# Patient Record
Sex: Male | Born: 1965 | Race: White | Hispanic: No | Marital: Single | Smoking: Current every day smoker
Health system: Southern US, Community
[De-identification: ages and names within clinical notes are randomized; demographics above are authoritative.]

## PROBLEM LIST (undated history)

## (undated) DIAGNOSIS — F419 Anxiety disorder, unspecified: Secondary | ICD-10-CM

## (undated) DIAGNOSIS — Z87442 Personal history of urinary calculi: Secondary | ICD-10-CM

## (undated) DIAGNOSIS — J439 Emphysema, unspecified: Secondary | ICD-10-CM

## (undated) DIAGNOSIS — K219 Gastro-esophageal reflux disease without esophagitis: Secondary | ICD-10-CM

## (undated) DIAGNOSIS — E291 Testicular hypofunction: Secondary | ICD-10-CM

## (undated) DIAGNOSIS — J45909 Unspecified asthma, uncomplicated: Secondary | ICD-10-CM

## (undated) DIAGNOSIS — K439 Ventral hernia without obstruction or gangrene: Secondary | ICD-10-CM

## (undated) DIAGNOSIS — K429 Umbilical hernia without obstruction or gangrene: Secondary | ICD-10-CM

## (undated) DIAGNOSIS — N189 Chronic kidney disease, unspecified: Secondary | ICD-10-CM

## (undated) DIAGNOSIS — I739 Peripheral vascular disease, unspecified: Secondary | ICD-10-CM

## (undated) DIAGNOSIS — M810 Age-related osteoporosis without current pathological fracture: Secondary | ICD-10-CM

## (undated) DIAGNOSIS — E119 Type 2 diabetes mellitus without complications: Secondary | ICD-10-CM

## (undated) DIAGNOSIS — G4733 Obstructive sleep apnea (adult) (pediatric): Secondary | ICD-10-CM

## (undated) DIAGNOSIS — E1142 Type 2 diabetes mellitus with diabetic polyneuropathy: Secondary | ICD-10-CM

## (undated) DIAGNOSIS — Z79891 Long term (current) use of opiate analgesic: Secondary | ICD-10-CM

## (undated) DIAGNOSIS — C801 Malignant (primary) neoplasm, unspecified: Secondary | ICD-10-CM

## (undated) DIAGNOSIS — T7840XA Allergy, unspecified, initial encounter: Secondary | ICD-10-CM

## (undated) DIAGNOSIS — N200 Calculus of kidney: Secondary | ICD-10-CM

## (undated) DIAGNOSIS — N529 Male erectile dysfunction, unspecified: Secondary | ICD-10-CM

## (undated) DIAGNOSIS — G8929 Other chronic pain: Secondary | ICD-10-CM

## (undated) DIAGNOSIS — I10 Essential (primary) hypertension: Secondary | ICD-10-CM

## (undated) DIAGNOSIS — G4730 Sleep apnea, unspecified: Secondary | ICD-10-CM

## (undated) DIAGNOSIS — M545 Low back pain, unspecified: Secondary | ICD-10-CM

## (undated) DIAGNOSIS — D509 Iron deficiency anemia, unspecified: Secondary | ICD-10-CM

## (undated) DIAGNOSIS — K760 Fatty (change of) liver, not elsewhere classified: Secondary | ICD-10-CM

## (undated) DIAGNOSIS — K449 Diaphragmatic hernia without obstruction or gangrene: Secondary | ICD-10-CM

## (undated) DIAGNOSIS — E785 Hyperlipidemia, unspecified: Secondary | ICD-10-CM

## (undated) DIAGNOSIS — F32A Depression, unspecified: Secondary | ICD-10-CM

## (undated) DIAGNOSIS — J449 Chronic obstructive pulmonary disease, unspecified: Secondary | ICD-10-CM

## (undated) DIAGNOSIS — M51379 Other intervertebral disc degeneration, lumbosacral region without mention of lumbar back pain or lower extremity pain: Secondary | ICD-10-CM

## (undated) DIAGNOSIS — K5790 Diverticulosis of intestine, part unspecified, without perforation or abscess without bleeding: Secondary | ICD-10-CM

## (undated) DIAGNOSIS — I2510 Atherosclerotic heart disease of native coronary artery without angina pectoris: Secondary | ICD-10-CM

## (undated) DIAGNOSIS — G629 Polyneuropathy, unspecified: Secondary | ICD-10-CM

## (undated) HISTORY — PX: VASECTOMY: SHX75

## (undated) HISTORY — PX: ABDOMINAL HYSTERECTOMY: SHX81

## (undated) HISTORY — PX: CIRCUMCISION: SUR203

## (undated) HISTORY — PX: MUSCLE BIOPSY: SHX716

## (undated) HISTORY — PX: APPENDECTOMY: SHX54

## (undated) HISTORY — PX: KIDNEY STONE SURGERY: SHX686

## (undated) HISTORY — DX: Chronic kidney disease, unspecified: N18.9

## (undated) HISTORY — PX: OTHER SURGICAL HISTORY: SHX169

## (undated) HISTORY — DX: Allergy, unspecified, initial encounter: T78.40XA

## (undated) HISTORY — DX: Anxiety disorder, unspecified: F41.9

## (undated) HISTORY — DX: Age-related osteoporosis without current pathological fracture: M81.0

## (undated) HISTORY — DX: Emphysema, unspecified: J43.9

---

## 2003-05-10 ENCOUNTER — Other Ambulatory Visit: Payer: Self-pay

## 2005-05-30 ENCOUNTER — Emergency Department: Payer: Self-pay | Admitting: Emergency Medicine

## 2005-12-07 ENCOUNTER — Other Ambulatory Visit: Payer: Self-pay

## 2005-12-07 ENCOUNTER — Emergency Department: Payer: Self-pay | Admitting: Emergency Medicine

## 2005-12-09 ENCOUNTER — Emergency Department: Payer: Self-pay | Admitting: Emergency Medicine

## 2007-05-11 ENCOUNTER — Other Ambulatory Visit: Payer: Self-pay

## 2007-05-11 ENCOUNTER — Emergency Department: Payer: Self-pay | Admitting: Emergency Medicine

## 2007-05-11 ENCOUNTER — Ambulatory Visit: Payer: Self-pay | Admitting: Cardiovascular Disease

## 2007-11-01 ENCOUNTER — Other Ambulatory Visit: Payer: Self-pay

## 2007-11-01 ENCOUNTER — Emergency Department: Payer: Self-pay | Admitting: Emergency Medicine

## 2007-11-01 IMAGING — CR RIGHT MIDDLE FINGER 2+V
1 series · 3 of 3 positions shown · non-contrast
Comparison: none

REASON FOR EXAM: pain swelling
COMMENTS:

[Series 1: view not recorded · 0.17mm/px · 3 of 3 slices shown]
[im 1/3]
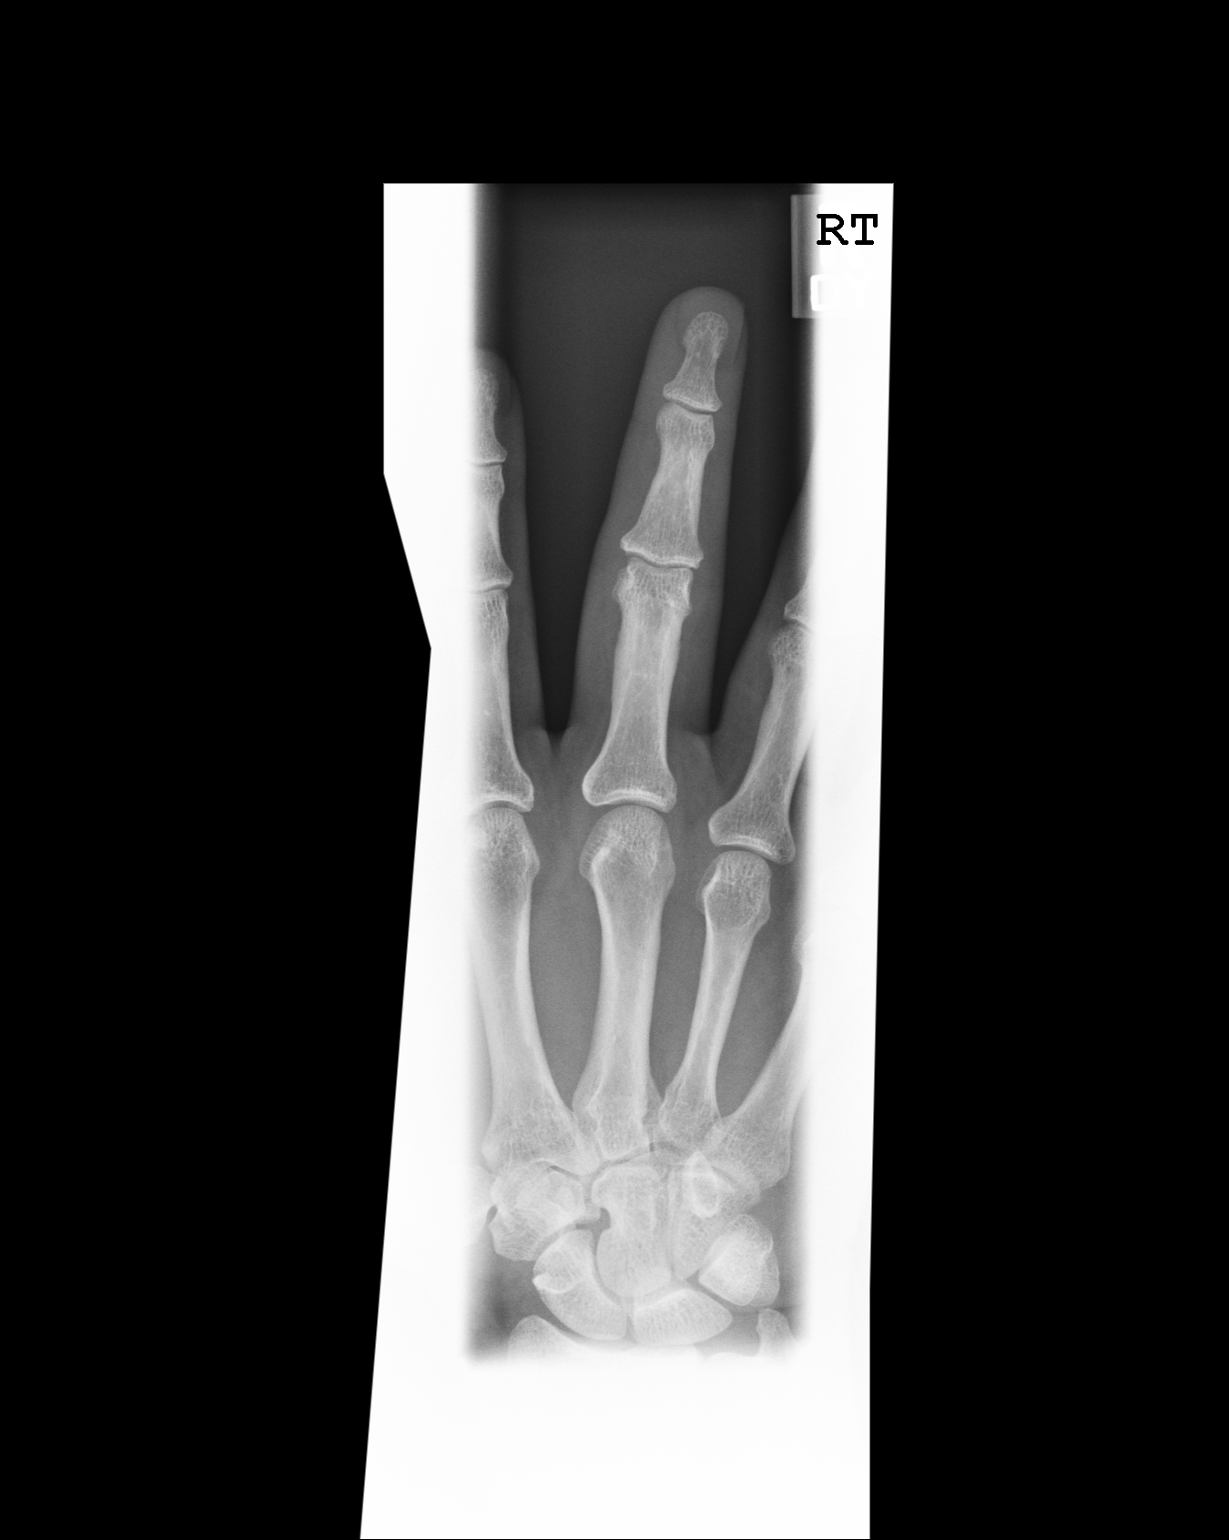
[im 2/3]
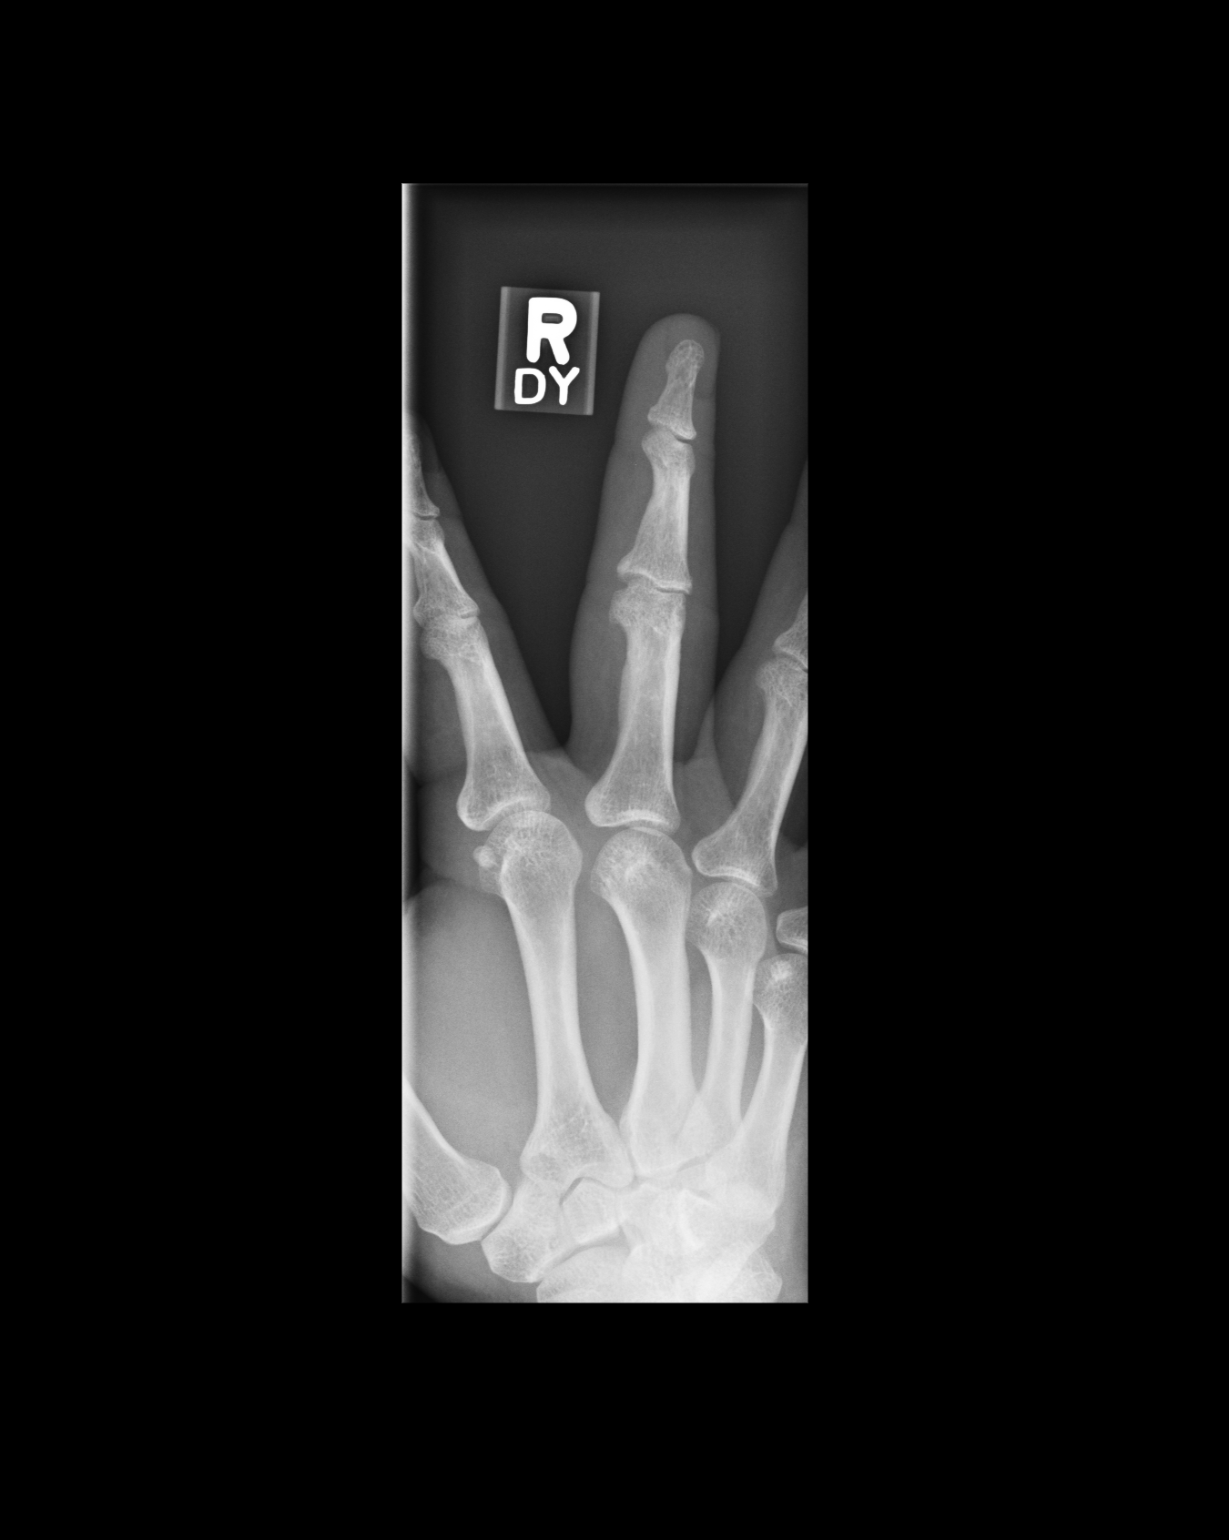
[im 3/3]
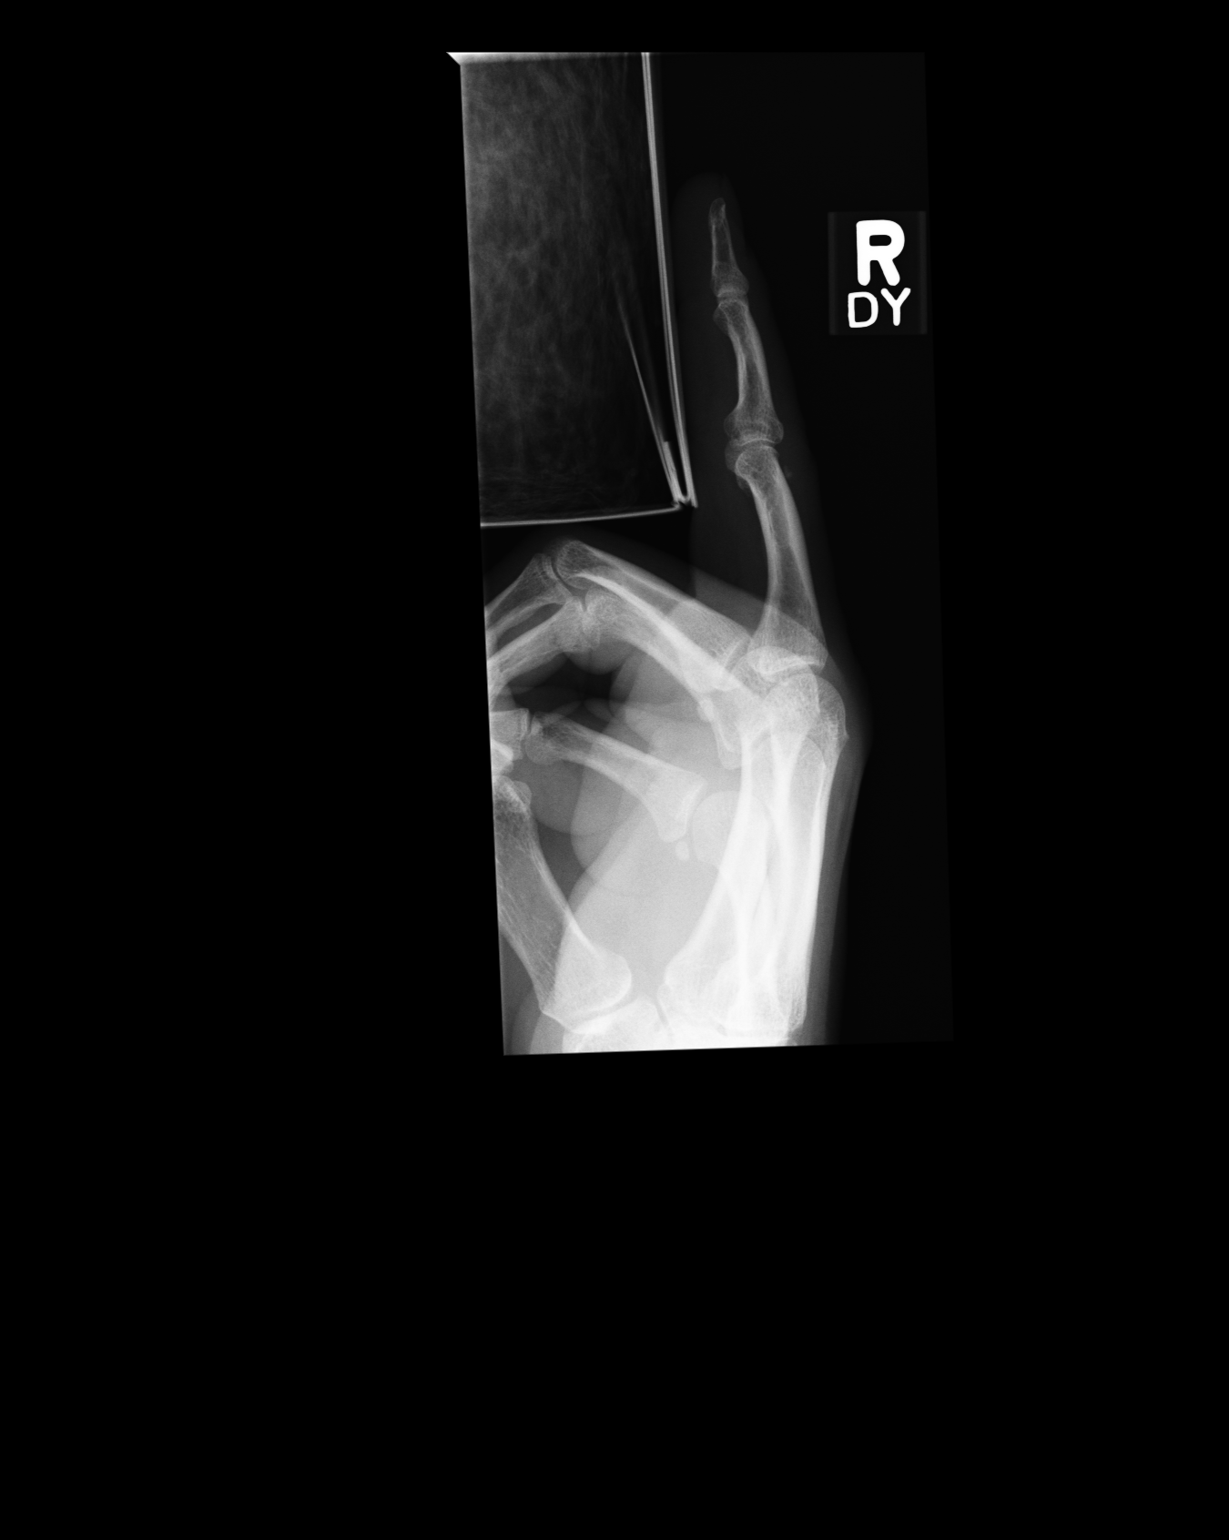

[3 of 3 positions shown; findings below may reference images not displayed]

PROCEDURE:     DXR - DXR FINGER MID 3RD DIGIT RT HAND  - [DATE]  [DATE]

RESULT:     No fracture, dislocation or other acute bony abnormality is
seen. There is slight hypertrophic spurring at the PIP joint. No erosive
arthritic changes are seen. No periosteal reaction is observed. No soft
tissue foreign body is seen.
IMPRESSION: Please see above.

## 2007-11-01 IMAGING — CR DG CHEST 2V
1 series · 2 of 2 positions shown · non-contrast
Comparison: none

REASON FOR EXAM: SOB
COMMENTS:

[Series 1: view not recorded · 0.17mm/px · 2 of 2 slices shown]
[im 1/2]
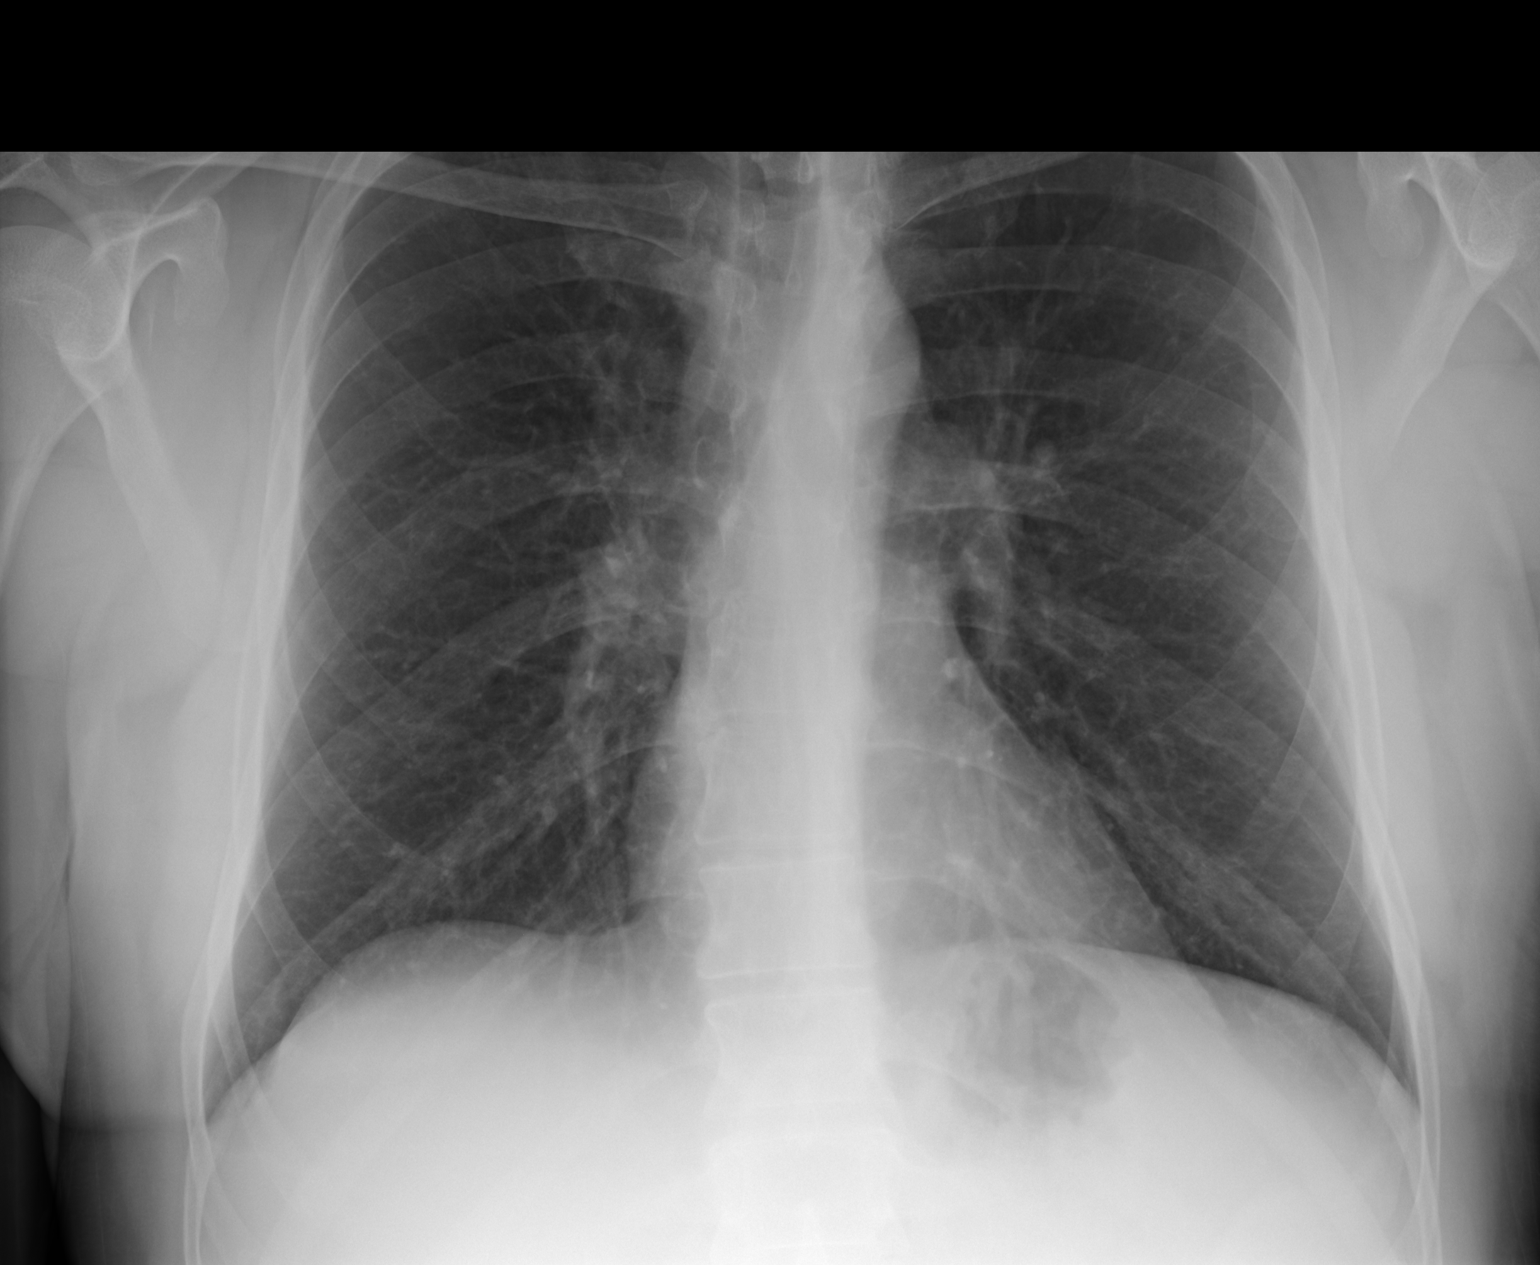
[im 2/2]
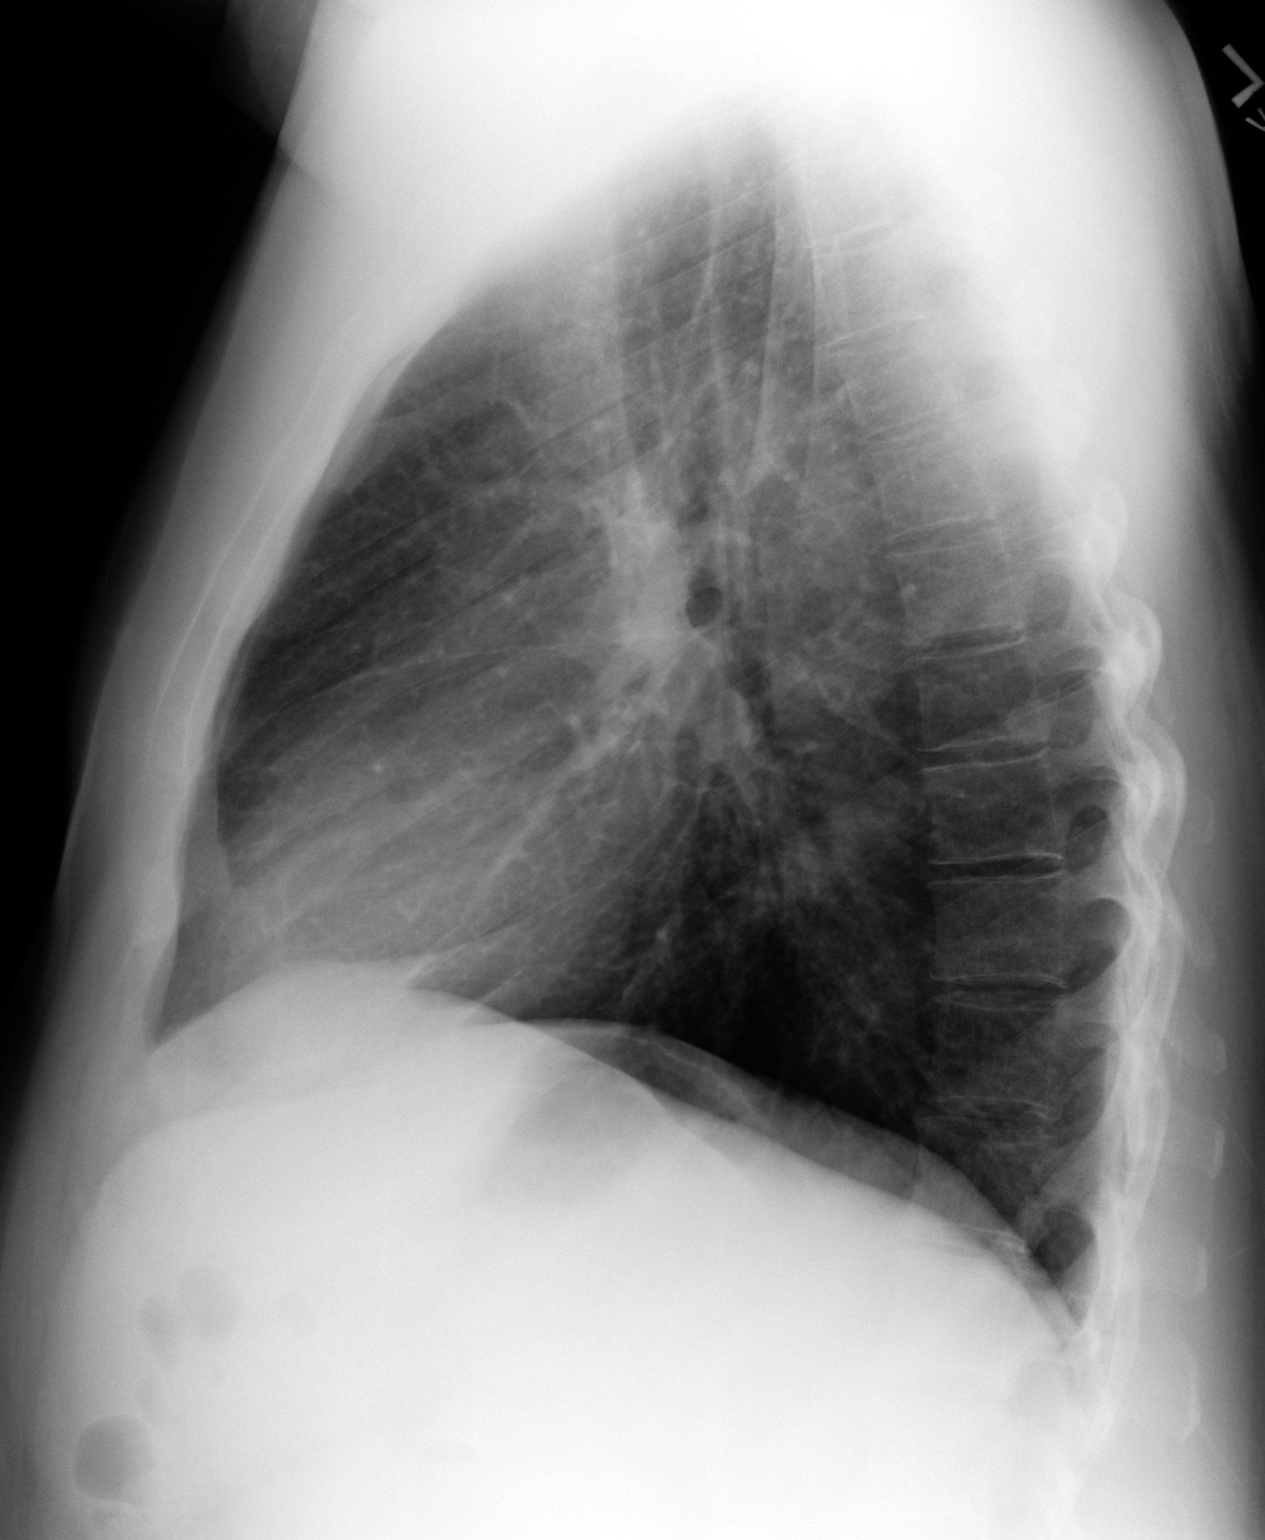

[2 of 2 positions shown; findings below may reference images not displayed]

PROCEDURE:     DXR - DXR CHEST PA (OR AP) AND LATERAL  - [DATE] [DATE]

RESULT:     There is no previous exam for comparison. The lungs are mildly
hyperinflated but clear. The heart and pulmonary vessels are normal.  The
bony and mediastinal structures are unremarkable. There is no infiltrate,
effusion or pneumothorax.
IMPRESSION: No acute cardiopulmonary disease evident.

## 2008-02-27 ENCOUNTER — Emergency Department: Payer: Self-pay | Admitting: Emergency Medicine

## 2008-02-27 IMAGING — CR DG CHEST 2V
1 series · 2 of 2 positions shown · non-contrast
Comparison: none

REASON FOR EXAM: cough
COMMENTS:

[Series 1: view not recorded · 0.17mm/px · 2 of 2 slices shown]
[im 1/2]
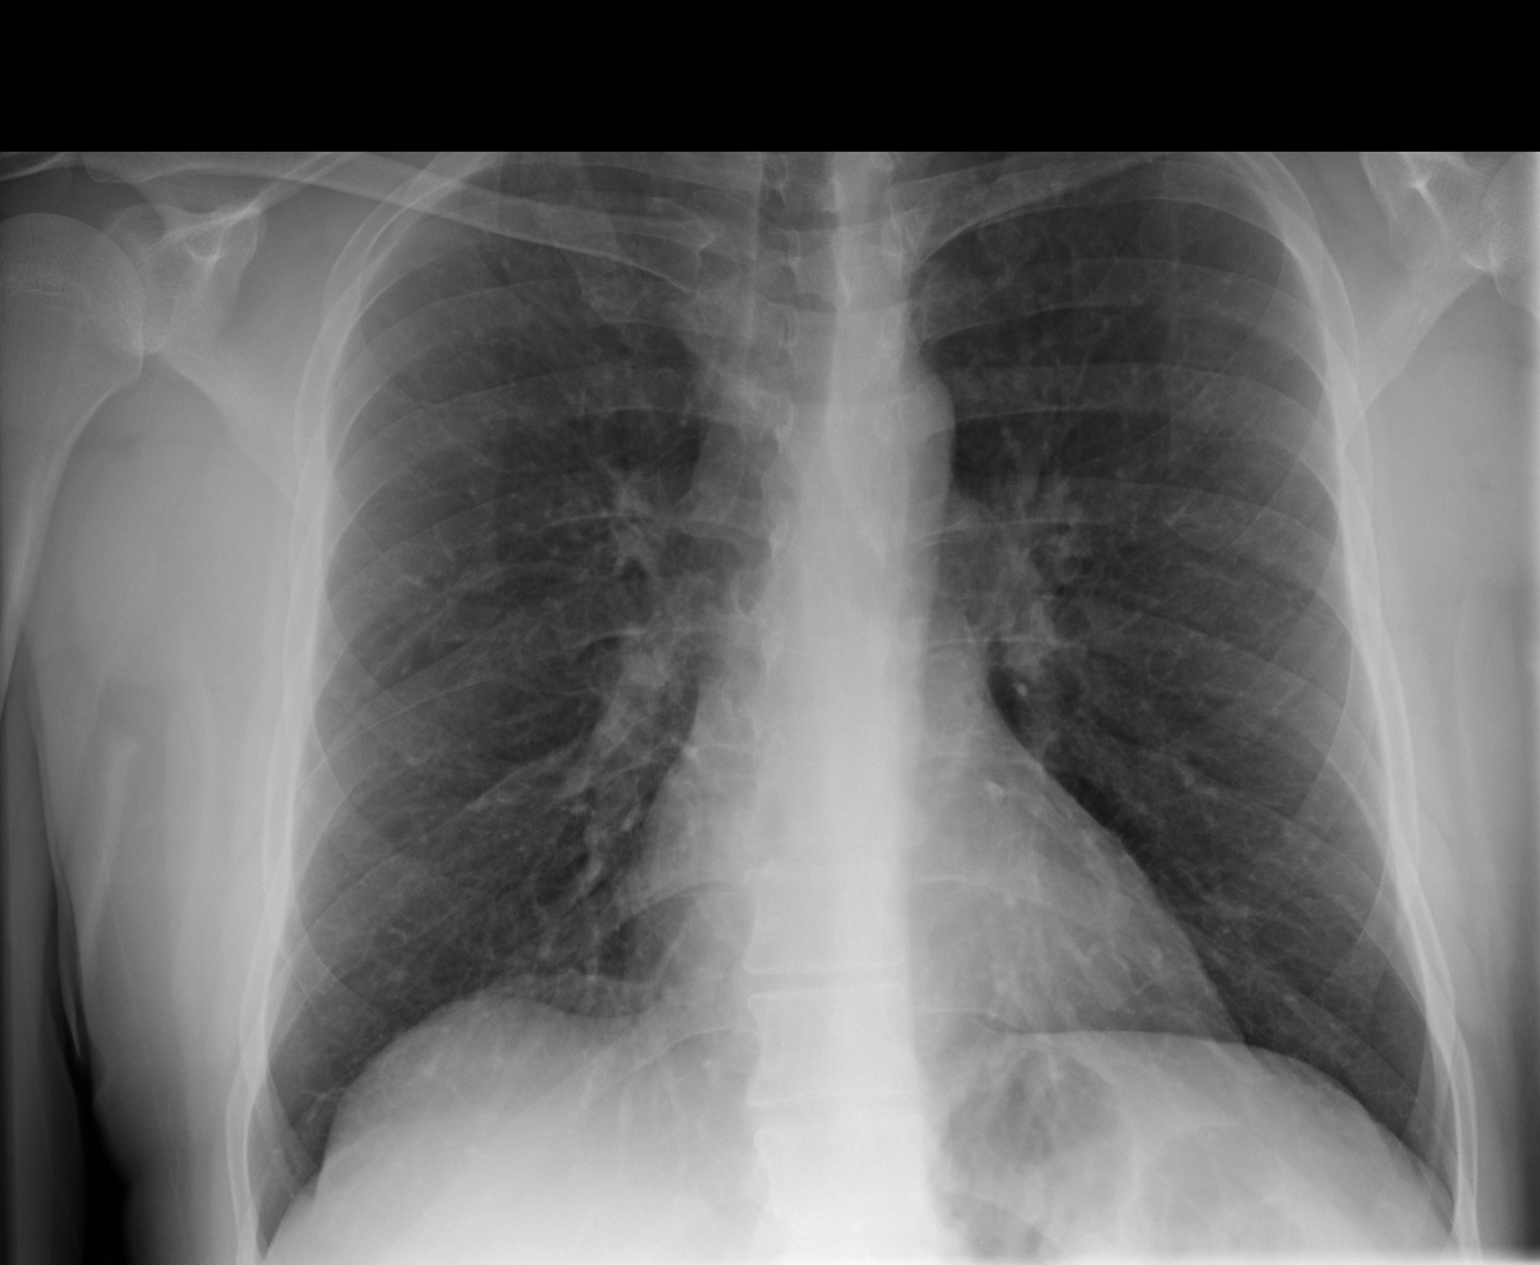
[im 2/2]
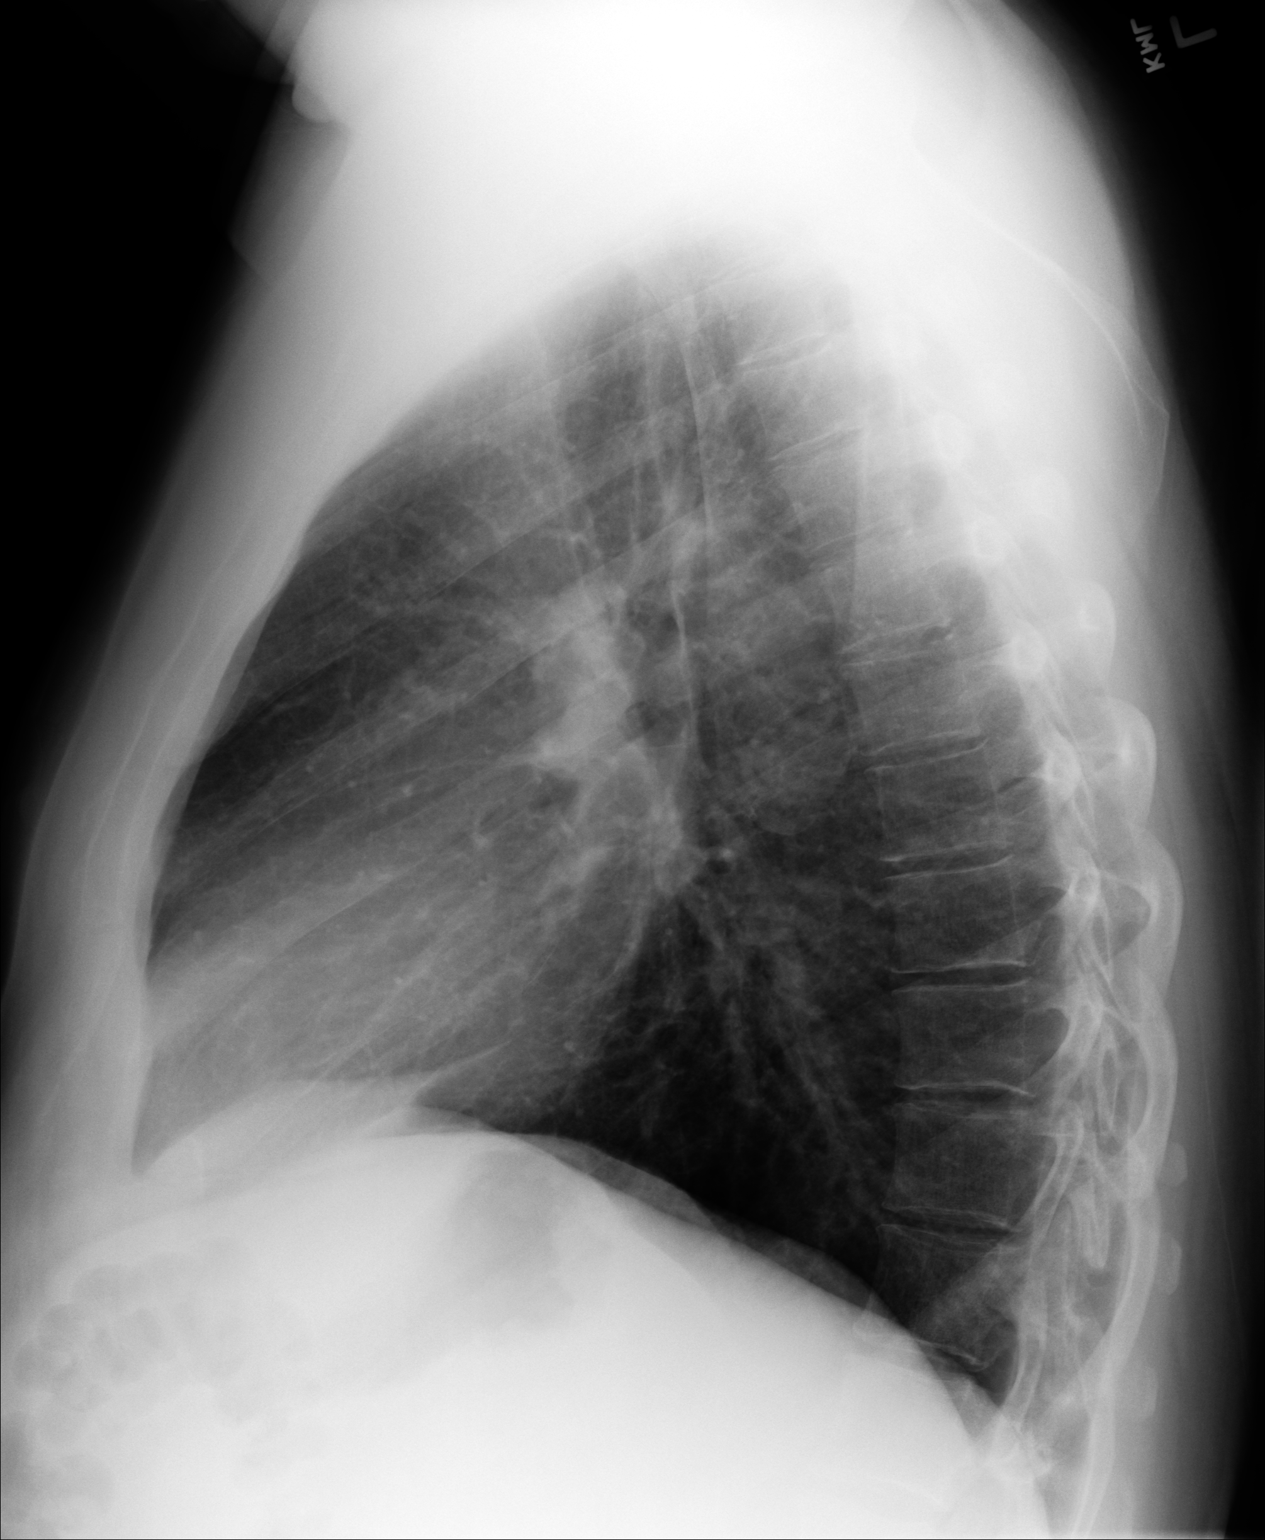

[2 of 2 positions shown; findings below may reference images not displayed]

PROCEDURE:     DXR - DXR CHEST PA (OR AP) AND LATERAL  - [DATE]  [DATE]

RESULT:     Comparison is made to a study [DATE]. The lungs are
mildly hyperinflated. There is no focal infiltrate. The heart is normal in
size. The interstitial markings of both lungs are mildly increased though
this is not entirely new.
IMPRESSION: I do not see evidence of pneumonia. I cannot exclude an
element of interstitial edema of likely noncardiac cause. Early interstitial
pneumonia could be present. Follow-up films are recommended following any
therapy or in 10 to 14 days.

## 2008-02-27 IMAGING — CT CT HEAD WITHOUT CONTRAST
2 series · 16 of 30 positions shown, 20 images · non-contrast
Comparison: none

REASON FOR EXAM: lightheaded, double vision
COMMENTS:

[Series 2: without · axial · non-contrast · 0.39mm/px · z∈[-152,-22]mm · 13 of 32 slices shown, 17 images]
[im 3/32  brain]
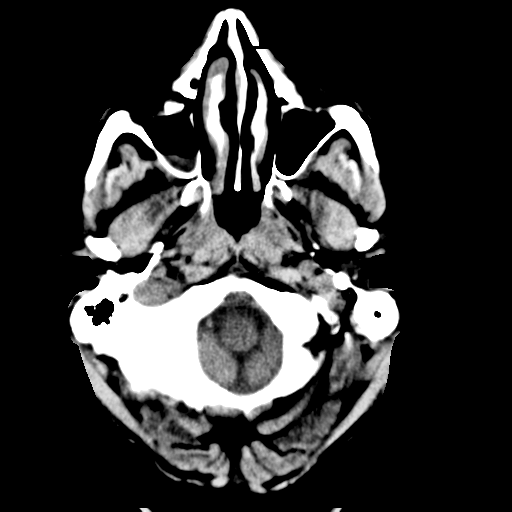
[im 3/32  bone]
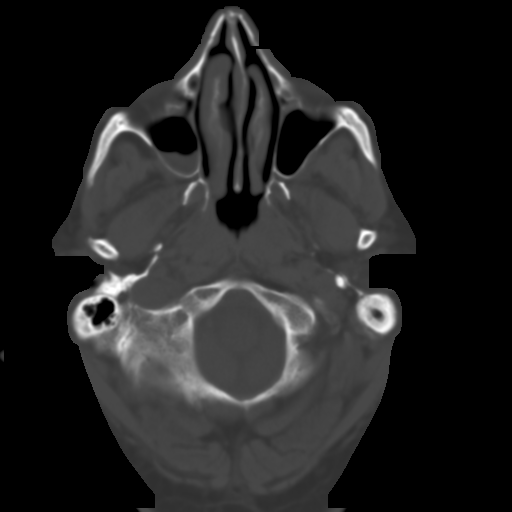
[im 5/32  brain]
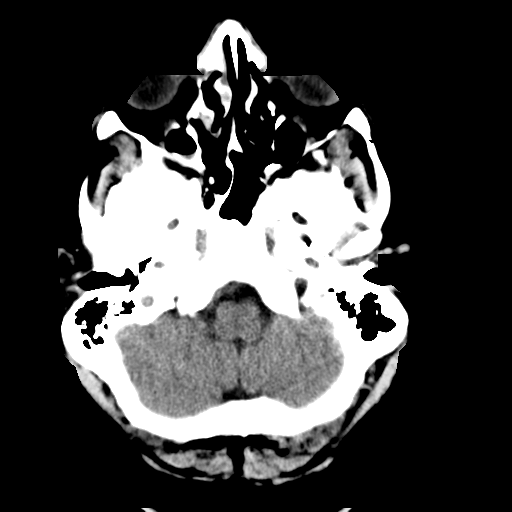
[im 7/32  brain]
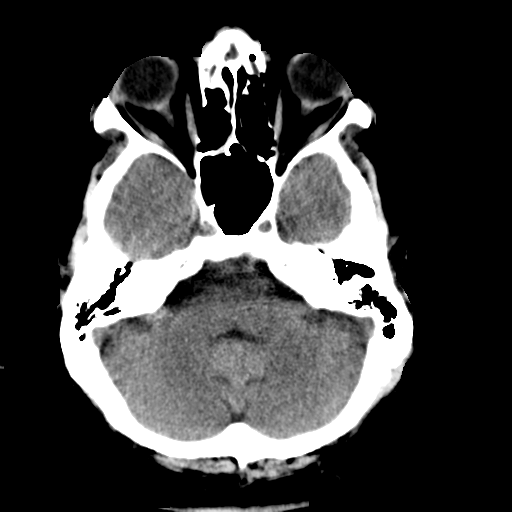
[im 9/32  brain]
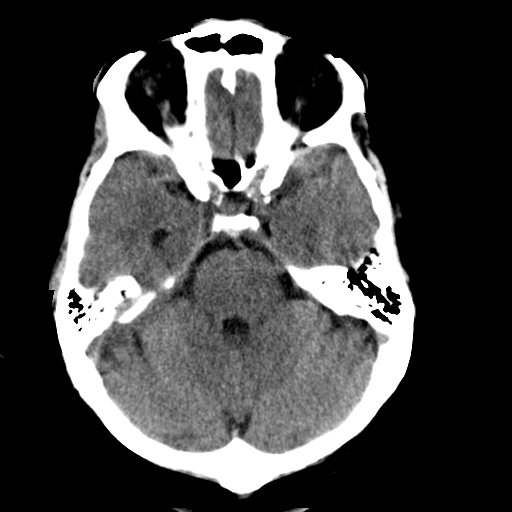
[im 12/32  brain]
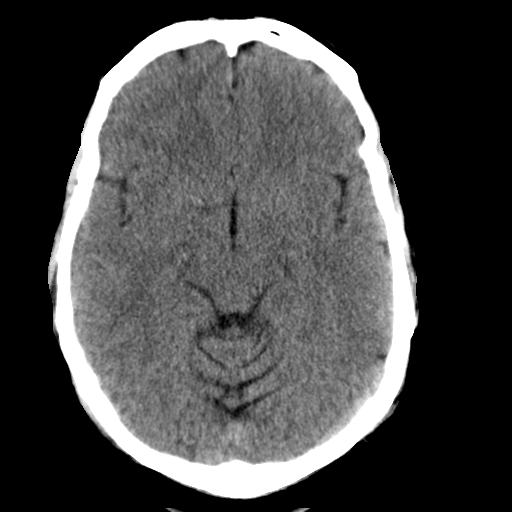
[im 12/32  bone]
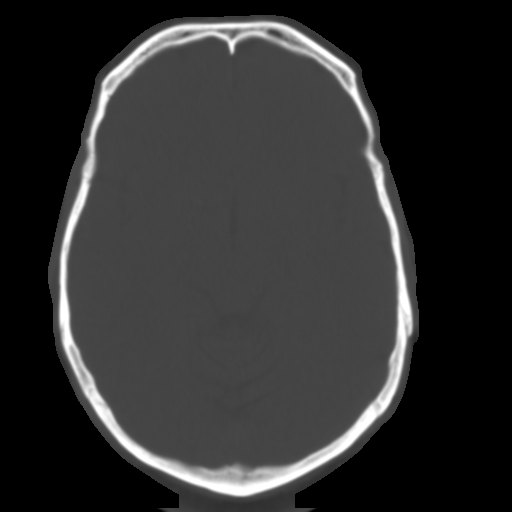
[im 14/32  brain]
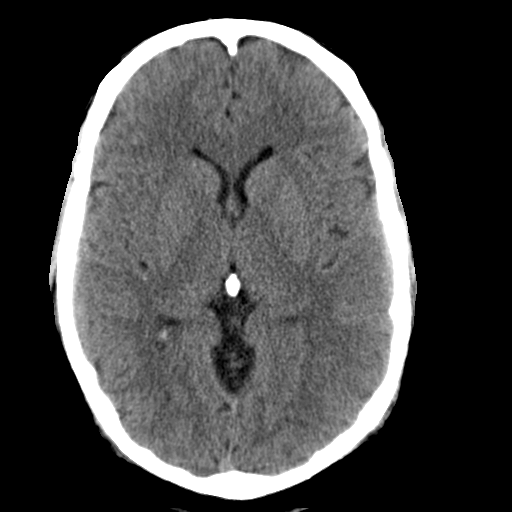
[im 16/32  brain]
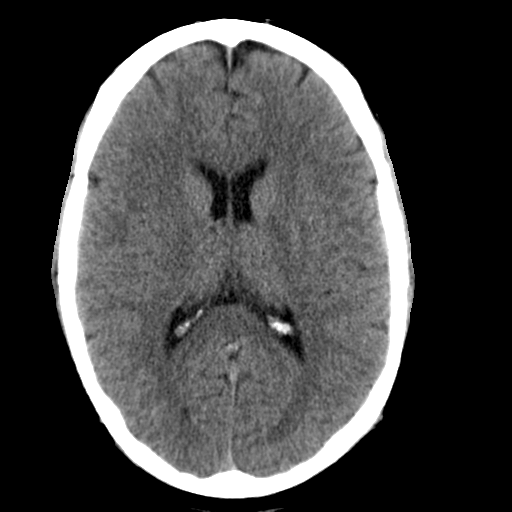
[im 18/32  brain]
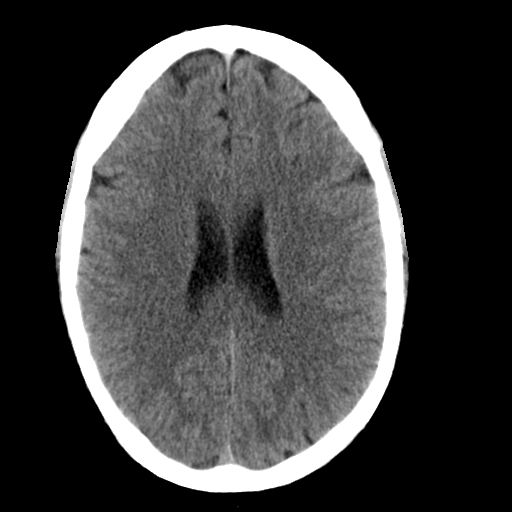
[im 20/32  brain]
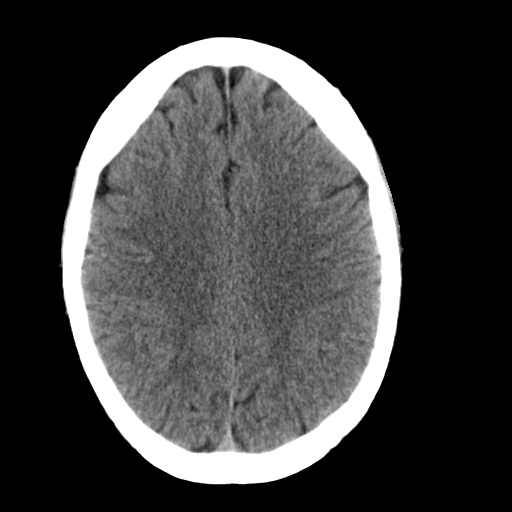
[im 20/32  bone]
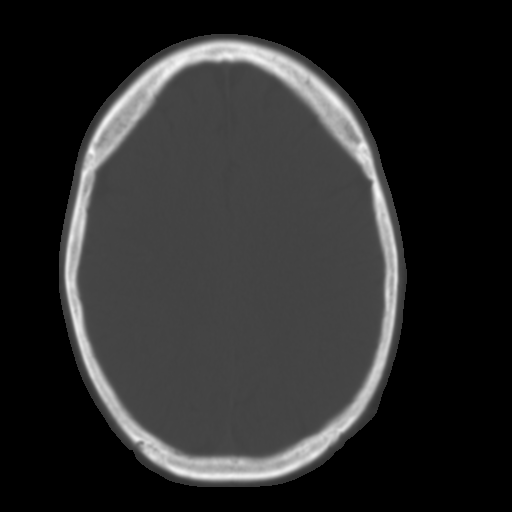
[im 23/32  brain]
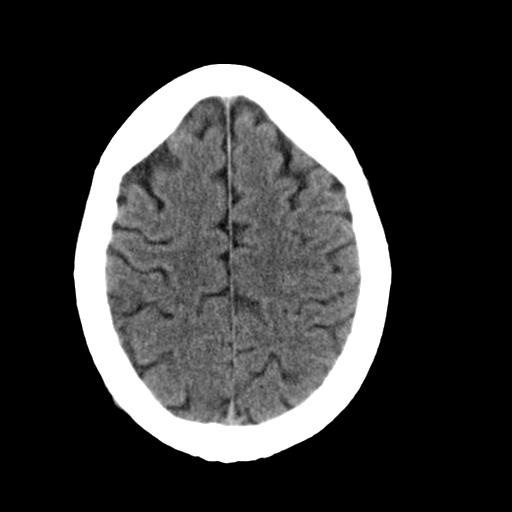
[im 25/32  brain]
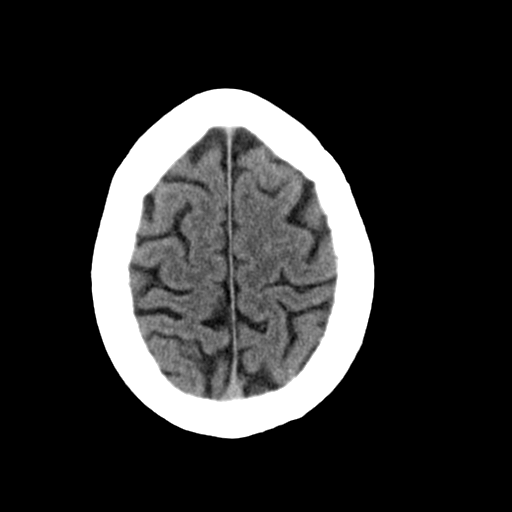
[im 27/32  brain]
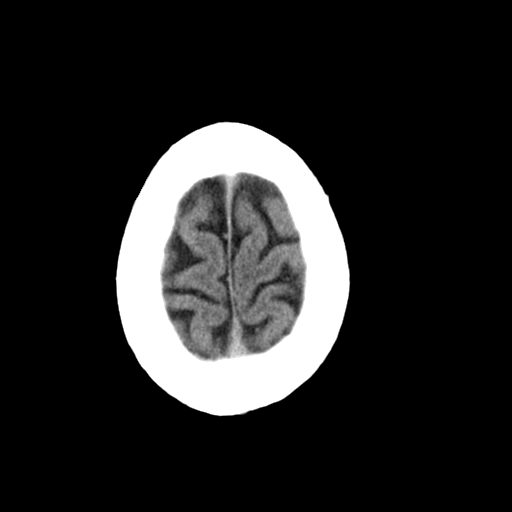
[im 29/32  brain]
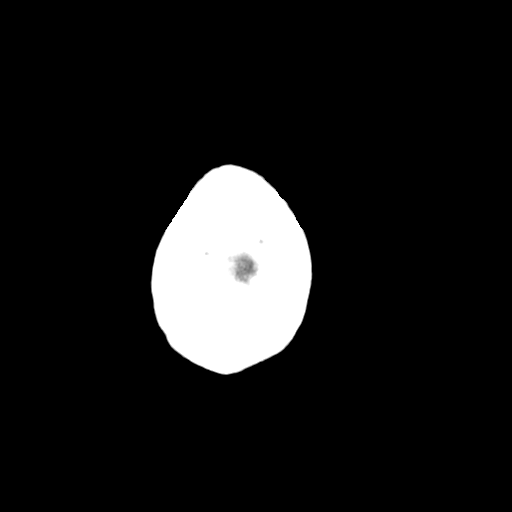
[im 29/32  bone]
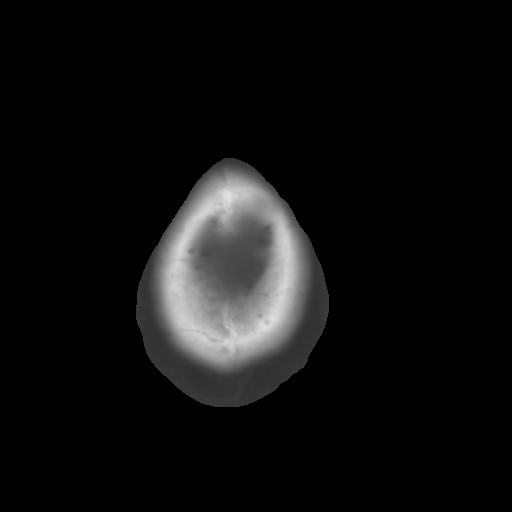

[Series 3: bone · axial · 0.39mm/px · z∈[-152,-107]mm · 3 of 32 slices shown]
[im 3/32  bone]
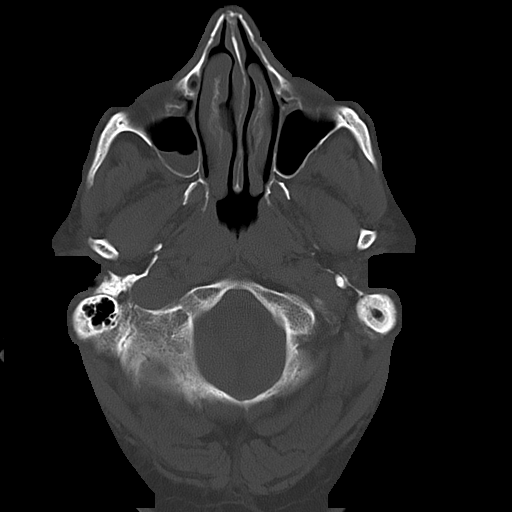
[im 7/32  bone]
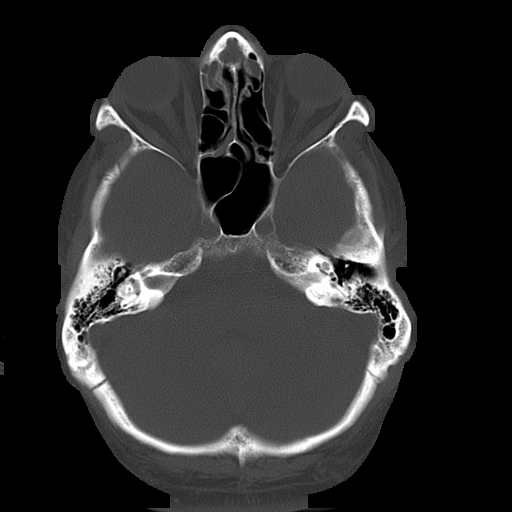
[im 12/32  bone]
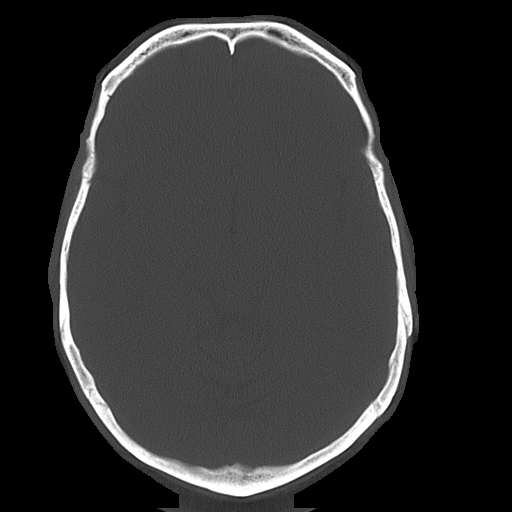

[16 of 30 positions shown; findings below may reference images not displayed]

PROCEDURE:     CT  - CT HEAD WITHOUT CONTRAST  - [DATE]  [DATE]

RESULT:     Noncontrast emergent CT of the brain demonstrates an air-fluid
level in the right maxillary sinus suggestive of acute sinusitis. Mucosal
thickening is seen in the ethmoid sinuses and frontal sinuses. The calvarium
is intact. There is no hemorrhage, edema, mass-effect or midline shift.
There is no territorial infarct.
IMPRESSION: Sinusitis. No acute intracranial abnormality.

## 2008-05-22 ENCOUNTER — Emergency Department: Payer: Self-pay | Admitting: Emergency Medicine

## 2010-04-01 ENCOUNTER — Observation Stay (HOSPITAL_COMMUNITY)
Admission: EM | Admit: 2010-04-01 | Discharge: 2010-04-03 | Payer: Self-pay | Source: Home / Self Care | Attending: Internal Medicine | Admitting: Internal Medicine

## 2010-04-01 IMAGING — CR DG CHEST 2V
2 series · 2 of 2 positions shown · non-contrast
Comparison: None.

CLINICAL DATA: Chest pain radiating to the left arm with shortness
of breath.  History of hypertension and COPD.

CHEST - 2 VIEW

[w chest pa]
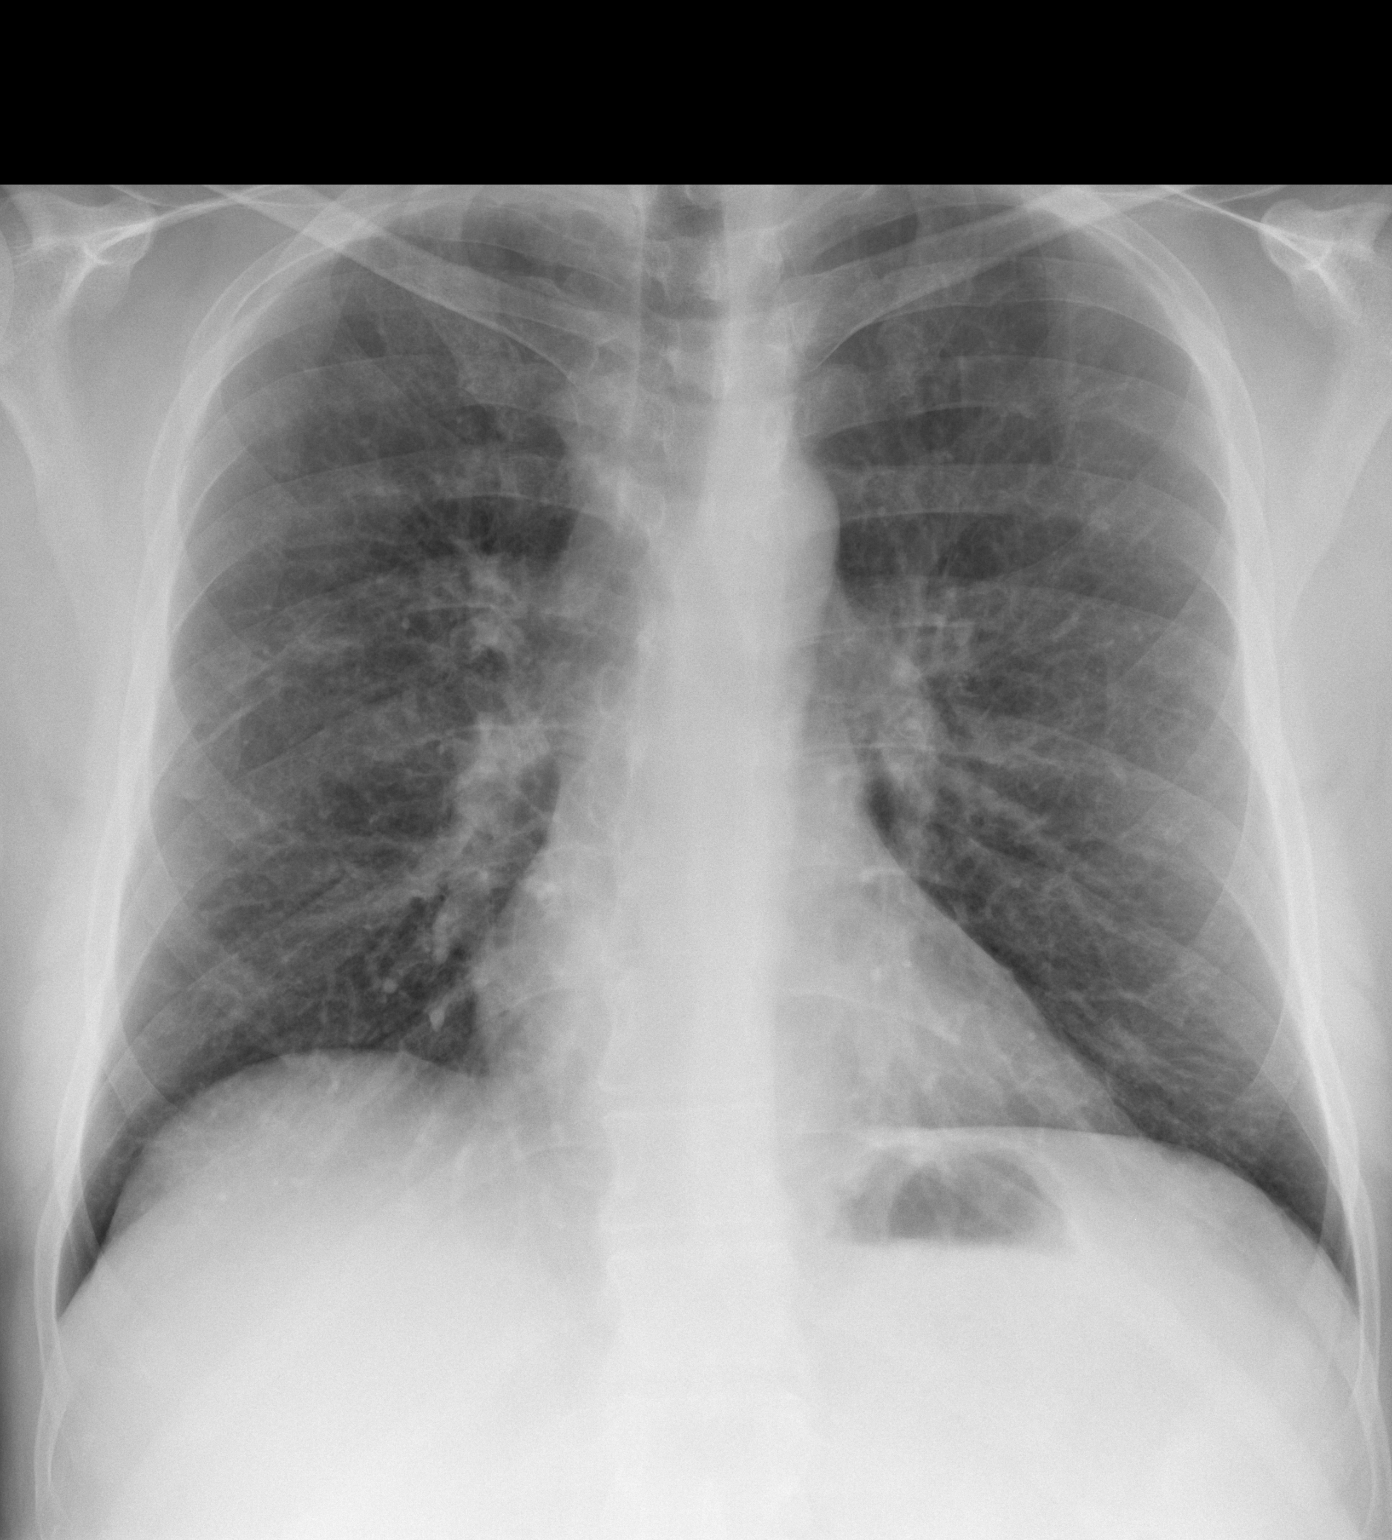

[w chest lat]
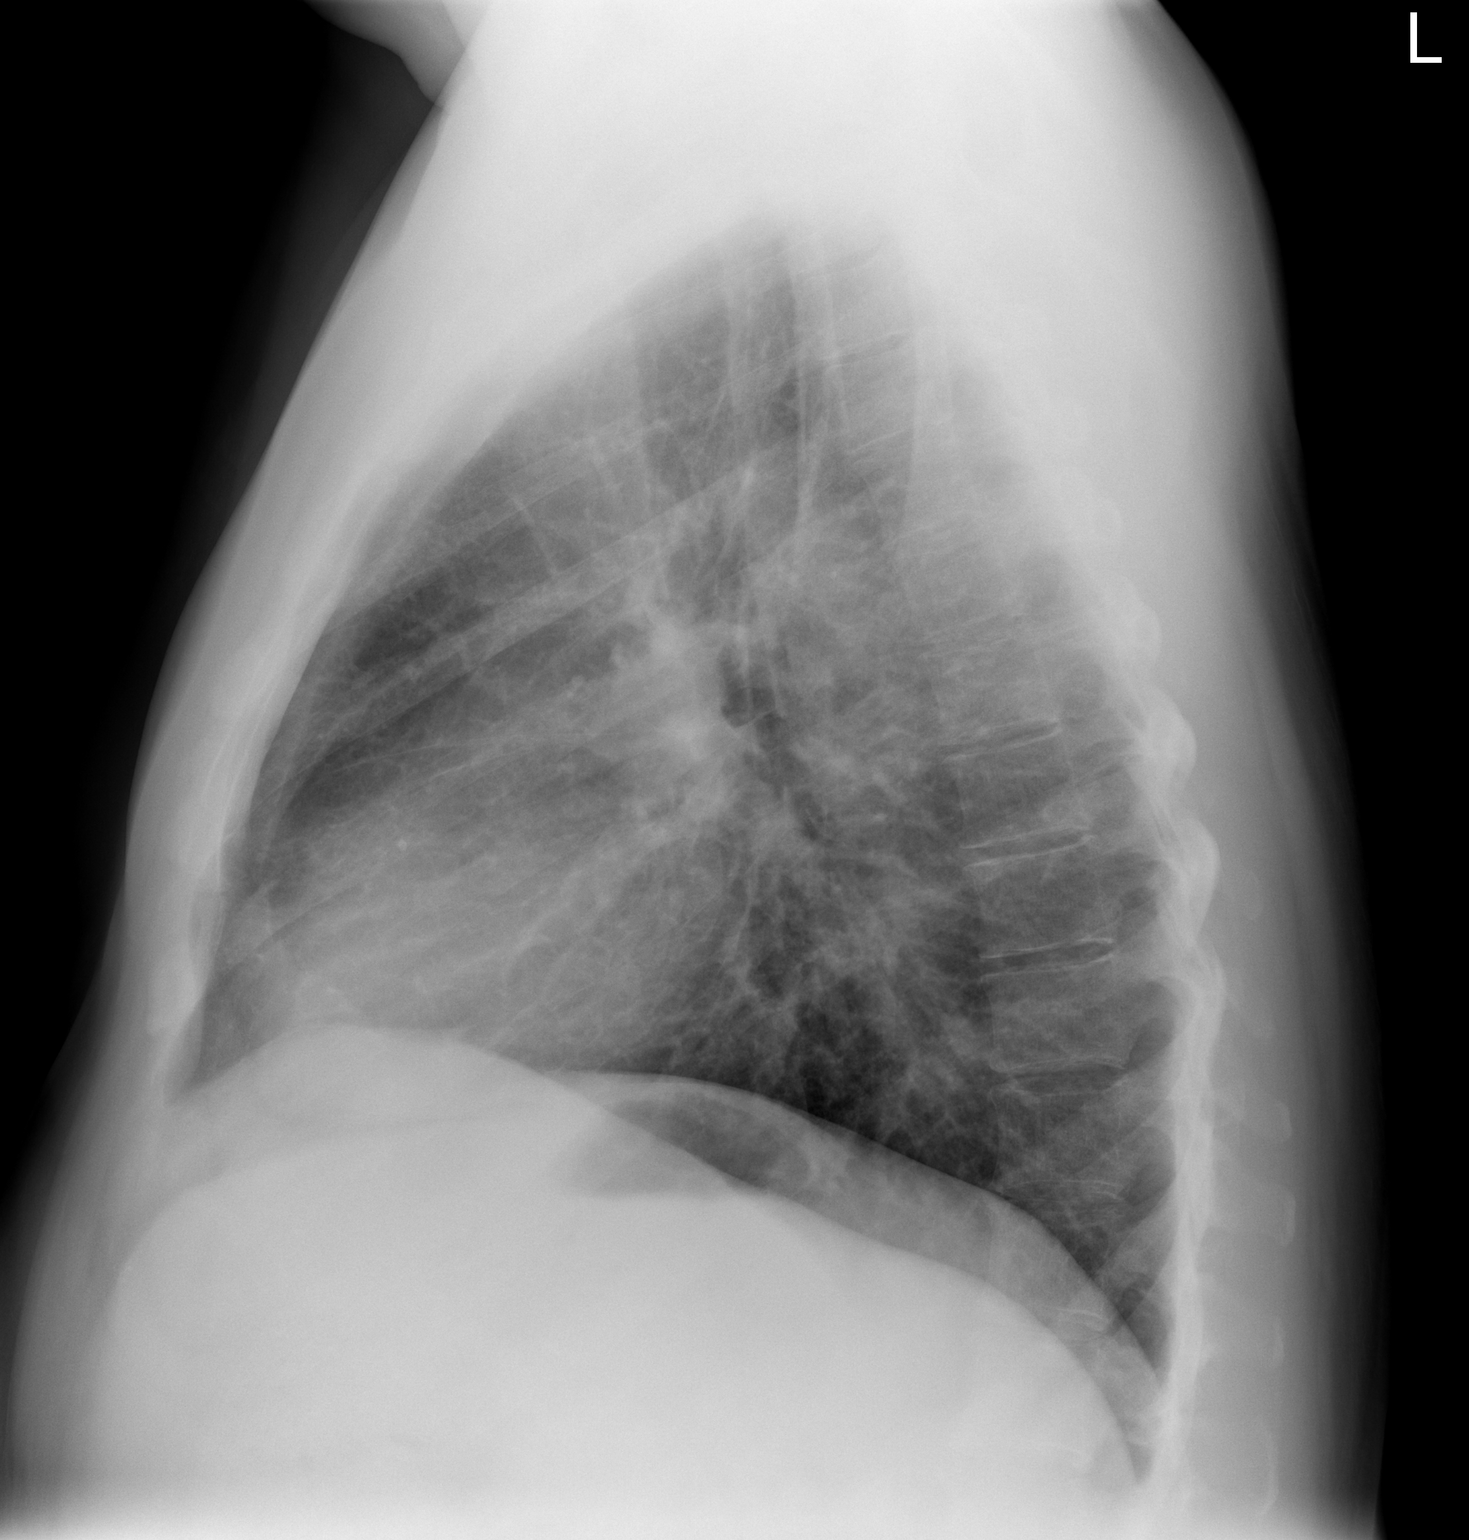

[2 of 2 positions shown; findings below may reference images not displayed]

FINDINGS: The heart size and mediastinal contours are normal.
There is generalized interstitial prominence with scattered
scarring.  No airspace disease, edema, mass or pleural effusion is
identified.  Osseous structures appear normal.
IMPRESSION: Interstitial prominence and scattered scarring consistent with
chronic lung disease.  No acute findings identified.

## 2010-04-02 ENCOUNTER — Encounter (INDEPENDENT_AMBULATORY_CARE_PROVIDER_SITE_OTHER): Payer: Self-pay | Admitting: Emergency Medicine

## 2010-04-02 LAB — BASIC METABOLIC PANEL
BUN: 13 mg/dL (ref 6–23)
CO2: 23 mEq/L (ref 19–32)
Calcium: 9.3 mg/dL (ref 8.4–10.5)
Chloride: 98 mEq/L (ref 96–112)
Creatinine, Ser: 1.02 mg/dL (ref 0.4–1.5)
GFR calc Af Amer: >60 mL/min (ref >60)
GFR calc non Af Amer: >60 mL/min (ref >60)
Glucose, Bld: 263 mg/dL — ABNORMAL HIGH (ref 70–99)
Potassium: 4.1 mEq/L (ref 3.5–5.1)
Sodium: 132 mEq/L — ABNORMAL LOW (ref 135–145)

## 2010-04-02 LAB — CBC
HCT: 44 % (ref 39.0–52.0)
Hemoglobin: 15.1 g/dL (ref 13.0–17.0)
MCH: 29 pg (ref 26.0–34.0)
MCHC: 34.3 g/dL (ref 30.0–36.0)
MCV: 84.6 fL (ref 78.0–100.0)
Platelets: 197 10*3/uL (ref 150–400)
RBC: 5.2 MIL/uL (ref 4.22–5.81)
RDW: 12.7 % (ref 11.5–15.5)
WBC: 7.3 10*3/uL (ref 4.0–10.5)

## 2010-04-02 LAB — DIFFERENTIAL
Basophils Absolute: 0 10*3/uL (ref 0.0–0.1)
Basophils Relative: 0 % (ref 0–1)
Eosinophils Absolute: 0.3 10*3/uL (ref 0.0–0.7)
Eosinophils Relative: 4 % (ref 0–5)
Lymphocytes Relative: 27 % (ref 12–46)
Lymphs Abs: 2 10*3/uL (ref 0.7–4.0)
Monocytes Absolute: 0.7 10*3/uL (ref 0.1–1.0)
Monocytes Relative: 9 % (ref 3–12)
Neutro Abs: 4.4 10*3/uL (ref 1.7–7.7)
Neutrophils Relative %: 60 % (ref 43–77)

## 2010-04-02 LAB — POCT CARDIAC MARKERS
CKMB, poc: 2.5 ng/mL (ref 1.0–8.0)
Myoglobin, poc: 178 ng/mL (ref 12–200)
Troponin i, poc: <0.05 ng/mL (ref 0.00–0.09)

## 2010-04-07 LAB — GLUCOSE, CAPILLARY
Glucose-Capillary: 165 mg/dL — ABNORMAL HIGH (ref 70–99)
Glucose-Capillary: 260 mg/dL — ABNORMAL HIGH (ref 70–99)
Glucose-Capillary: 152 mg/dL — ABNORMAL HIGH (ref 70–99)
Glucose-Capillary: 158 mg/dL — ABNORMAL HIGH (ref 70–99)
Glucose-Capillary: 162 mg/dL — ABNORMAL HIGH (ref 70–99)
Glucose-Capillary: 164 mg/dL — ABNORMAL HIGH (ref 70–99)

## 2010-04-07 LAB — COMPREHENSIVE METABOLIC PANEL
ALT: 23 U/L (ref 0–53)
AST: 19 U/L (ref 0–37)
Albumin: 3.4 g/dL — ABNORMAL LOW (ref 3.5–5.2)
Alkaline Phosphatase: 74 U/L (ref 39–117)
BUN: 11 mg/dL (ref 6–23)
CO2: 25 mEq/L (ref 19–32)
Calcium: 8.5 mg/dL (ref 8.4–10.5)
Chloride: 106 mEq/L (ref 96–112)
Creatinine, Ser: 0.92 mg/dL (ref 0.4–1.5)
GFR calc Af Amer: >60 mL/min (ref >60)
GFR calc non Af Amer: >60 mL/min (ref >60)
Glucose, Bld: 209 mg/dL — ABNORMAL HIGH (ref 70–99)
Potassium: 3.7 mEq/L (ref 3.5–5.1)
Sodium: 138 mEq/L (ref 135–145)
Total Bilirubin: 0.4 mg/dL (ref 0.3–1.2)
Total Protein: 6 g/dL (ref 6.0–8.3)

## 2010-04-07 LAB — CBC
HCT: 41.2 % (ref 39.0–52.0)
HCT: 42.2 % (ref 39.0–52.0)
Hemoglobin: 13.7 g/dL (ref 13.0–17.0)
Hemoglobin: 14.1 g/dL (ref 13.0–17.0)
MCH: 28.2 pg (ref 26.0–34.0)
MCH: 28 pg (ref 26.0–34.0)
MCHC: 33.3 g/dL (ref 30.0–36.0)
MCHC: 33.4 g/dL (ref 30.0–36.0)
MCV: 84.9 fL (ref 78.0–100.0)
MCV: 83.9 fL (ref 78.0–100.0)
Platelets: 187 10*3/uL (ref 150–400)
Platelets: 186 10*3/uL (ref 150–400)
RBC: 4.85 MIL/uL (ref 4.22–5.81)
RBC: 5.03 MIL/uL (ref 4.22–5.81)
RDW: 12.8 % (ref 11.5–15.5)
RDW: 12.7 % (ref 11.5–15.5)
WBC: 6.8 10*3/uL (ref 4.0–10.5)
WBC: 6.4 10*3/uL (ref 4.0–10.5)

## 2010-04-07 LAB — TSH: TSH: 1.24 u[IU]/mL (ref 0.350–4.500)

## 2010-04-07 LAB — CARDIAC PANEL(CRET KIN+CKTOT+MB+TROPI)
CK, MB: 2.7 ng/mL (ref 0.3–4.0)
CK, MB: 2.4 ng/mL (ref 0.3–4.0)
Relative Index: 1.4 (ref 0.0–2.5)
Relative Index: 1.5 (ref 0.0–2.5)
Total CK: 187 U/L (ref 7–232)
Total CK: 159 U/L (ref 7–232)
Troponin I: 0.01 ng/mL (ref 0.00–0.06)
Troponin I: <0.01 ng/mL (ref 0.00–0.06)

## 2010-04-07 LAB — MAGNESIUM: Magnesium: 2.1 mg/dL (ref 1.5–2.5)

## 2010-04-07 LAB — BRAIN NATRIURETIC PEPTIDE: Pro B Natriuretic peptide (BNP): <30 pg/mL (ref 0.0–100.0)

## 2010-04-07 LAB — BASIC METABOLIC PANEL
BUN: 8 mg/dL (ref 6–23)
CO2: 25 mEq/L (ref 19–32)
Calcium: 8.8 mg/dL (ref 8.4–10.5)
Chloride: 106 mEq/L (ref 96–112)
Creatinine, Ser: 0.81 mg/dL (ref 0.4–1.5)
GFR calc Af Amer: >60 mL/min (ref >60)
GFR calc non Af Amer: >60 mL/min (ref >60)
Glucose, Bld: 140 mg/dL — ABNORMAL HIGH (ref 70–99)
Potassium: 3.9 mEq/L (ref 3.5–5.1)
Sodium: 138 mEq/L (ref 135–145)

## 2010-04-07 LAB — LIPID PANEL
Cholesterol: 169 mg/dL (ref 0–200)
HDL: 27 mg/dL — ABNORMAL LOW (ref >39)
LDL Cholesterol: 73 mg/dL (ref 0–99)
Total CHOL/HDL Ratio: 6.3 RATIO
Triglycerides: 346 mg/dL — ABNORMAL HIGH (ref <150)
VLDL: 69 mg/dL — ABNORMAL HIGH (ref 0–40)

## 2010-04-07 LAB — HEMOGLOBIN A1C
Hgb A1c MFr Bld: 8.7 % — ABNORMAL HIGH (ref <5.7)
Mean Plasma Glucose: 203 mg/dL — ABNORMAL HIGH (ref <117)

## 2010-04-07 LAB — CK TOTAL AND CKMB (NOT AT ARMC)
CK, MB: 3.1 ng/mL (ref 0.3–4.0)
Relative Index: 1.3 (ref 0.0–2.5)
Total CK: 238 U/L — ABNORMAL HIGH (ref 7–232)

## 2010-04-07 LAB — HEPARIN LEVEL (UNFRACTIONATED): Heparin Unfractionated: <0.1 IU/mL — ABNORMAL LOW (ref 0.30–0.70)

## 2010-04-07 LAB — TROPONIN I: Troponin I: 0.02 ng/mL (ref 0.00–0.06)

## 2010-04-07 LAB — PHOSPHORUS: Phosphorus: 3.2 mg/dL (ref 2.3–4.6)

## 2010-04-07 NOTE — Procedures (Addendum)
  NAME:  Darren Allen, RICKETSON              ACCOUNT NO.:  [id number]  MEDICAL RECORD NO.:  [id number]          PATIENT TYPE:  OBV  LOCATION:  4702                         FACILITY:  [facility]  PHYSICIAN:  Italy Hilty, MD         DATE OF BIRTH:  Feb 02, 1966  DATE OF PROCEDURE:  04/03/2010 DATE OF DISCHARGE:  04/03/2010                           CARDIAC CATHETERIZATION   OPERATOR:  Italy Hilty, MD  INDICATIONS:  Chest pain.  HISTORY OF PRESENT ILLNESS:  Mr. Mcevoy is a 45 year old male with a recent negative nuclear stress test in September who presented with left- sided chest pain.  It was associated with some shortness of breath, cough, nausea, and diaphoresis.  He does have a history of hypertension, hyperlipidemia, smoking, and COPD as well.  He is referred for left heart catheterization.  PROCEDURE:  The patient was brought into the cardiac catheterization lab, sterilely prepped and draped in normal fashion.  After procedural time-out, the area around the right femoral artery was identified and this was anesthetized with approximately 10 mL of 1% lidocaine.  After local anesthesia was achieved, the patient was given approximately 3 mg of Versed, 75 mg of fentanyl for moderate sedation.  The right femoral artery was accessed with a straight needle and wire using the Seldinger technique and after a 5-French femoral access catheter was placed, sequential cardiac catheterization was performed with a 5-French JL-4 and JL-3.5 catheters which were necessary to identify the separate ostia of the left circumflex and LAD as well as a 5-French JR-4 catheter to canalize the right.  Estimated blood loss was less than 10 mL and there were no acute complications.  FINDINGS: 1. Left main - none, separate ostia. 2. LAD - diffuse tapering disease.  No significant stenosis. 3. Left circumflex, mild tapering disease, small vessel. 4. RCA dominant with a pitch-fork inferolateral distribution.  No  significant stenosis. 5. LVEDP = 21 mmHg.  IMPRESSION: 1. Suspect diffuse tapering disease secondary to diabetes versus     coronary spasm.  However, there are no significant stenoses. 2. Elevated left ventricular end diastolic pressure suggestive of left     ventricular diastolic dysfunction. 3. There may also be concomitant small vessel disease. 4. Prominent bronchioles and bronchi in x-ray suggesting chronic     obstructive pulmonary disease or possibly sarcoid.  PLAN:  At this point I would recommend medical therapy of his chest pain and he may benefit from addition of calcium channel blocker and long- acting nitrate if he has some small vessel disease, however, there is no indication for stent or any bypass surgery at this point.     Italy Hilty, MD     CH/MEDQ  D:  04/03/2010  T:  04/04/2010  Job:  [id number]  Electronically Signed by K. HILTY M.D. on 04/07/2010 01:17:52 PM

## 2010-04-14 NOTE — Discharge Summary (Addendum)
Darren Allen, Darren Allen NO.:  [id number]  MEDICAL RECORD NO.:  [id number]          PATIENT TYPE:  OBV  LOCATION:  4702                         FACILITY:  [facility]  PHYSICIAN:  Peggye Pitt, M.D. DATE OF BIRTH:  [birth year]  DATE OF ADMISSION:  04/01/2010 DATE OF DISCHARGE:  04/03/2010                              DISCHARGE SUMMARY   PRIMARY CARE PHYSICIAN:  Sherrie Mustache, MD, [facility].  DISCHARGE DIAGNOSES: 1. 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 2. Hypertension. 3. History of chronic obstructive pulmonary disease. 4. Hyperlipidemia. 5. Tobacco abuse. 6. Type 2 diabetes mellitus, previously diet controlled.  DISCHARGE MEDICATIONS: 1. Tylenol 650 mg every 4 hours as needed for pain. 2. Aspirin 325 mg daily. 3. Cardizem CD 120 mg daily. 4. Guiafenesin 600 mg twice daily. 5. Imdur 30 mg daily. 6. Metformin 500 mg twice daily to start on Saturday, April 05, 2010. 7. Nicotine patch daily. 8. Nitroglycerin 0.4 mg sublingual every 5 minutes as needed up to 3     doses for chest pain. 9. Advair 250/50 one puff twice daily. 10.Testosterone gel 7.5 mg 1 application daily. 11.Combivent 1-2 puffs 4 times daily as needed. 12.Lisinopril 5 mg daily. 13.Lovastatin 10 mg daily. 14.Protonix 20 mg daily. 15.Spiriva 18 mcg daily.  DISPOSITION AND FOLLOWUP:  Darren Allen will be discharged home today in stable condition.  He is instructed to follow up with Dr. Dario Guardian in approximately 3-4 weeks.  CONSULTATIONS THIS HOSPITALIZATION:  Landry Corporal, MD, [facility].  IMAGES AND PROCEDURES: 1. A chest x-ray on April 01, 2010 with findings of scattered     scarring consistent with chronic lung disease with no acute     findings. 2. A 2-D echocardiogram on April 02, 2010 that showed an ejection     fraction of  55-60% with no wall motion abnormalities.  No evidence     for diastolic dysfunction. 3. Cardiac catheterization on April 03, 2010 that showed a dominant     right coronary artery with no significant coronary artery disease     with diffuse tapering disease versus coronary vasospasm and an     elevated left ventricular end-diastolic pressure suggesting     diastolic disease.  HISTORY AND PHYSICAL:  For full details, see dictation by Dr. Cleone Slim on April 01, 2010, but in brief, Darren Allen is a pleasant 45 year old Caucasian gentleman with a history of GERD, COPD, and hyperlipidemia who had had chest pain in the past and had a stress test done in September which was normal.  He presented to the hospital for recurrent shortness of breath and chest pain and we were asked to admit him for further evaluation.  His coronary artery disease risk factors include hypertension, diabetes, hyperlipidemia, and tobacco abuse.  HOSPITAL COURSE BY PROBLEM: 1. Chest pain.  He ruled out for an acute MI by the way of 3 sets of     negative cardiac enzymes and EKGs with no acute ischemic changes.     However, because of  his history and risk factors, Cards decided to     perform a cardiac catheterization with findings as above.  He has     been started on Cardizem to help with vasospasm and lower blood     pressure.  He is instructed to follow up with Dr. Herbie Baltimore as well     as his PCP. 2. Diabetes.  He had currently been diet controlled, however, his A1c     was found to be 8.2 and he had sugars consistently above 200.     Because of this, I have asked him to restart his metformin,     however, because of the contrast injected during his cath, I have     asked him to wait 48 hours before resuming his metformin.  This     will be dosed at 500 mg twice daily. 3. Hyperlipidemia.  He is to continue his statin. 4. COPD.  He is to continue his Spiriva and Advair as well as his     p.r.n. albuterol.  All the  rest of his chronic conditions have been stable.  DISCHARGE VITAL SIGNS:  Blood pressure 118/73, heart rate 81, respirations 20, sats of 95% on room air, and a temperature of 97.5.     Peggye Pitt, M.D.     EH/MEDQ  D:  04/03/2010  T:  04/04/2010  Job:  [id number]  cc:   Sherrie Mustache, M.D. Landry Corporal, MD  Electronically Signed by Peggye Pitt M.D. on 04/14/2010 07:54:45 AM

## 2010-07-18 ENCOUNTER — Emergency Department: Payer: Self-pay | Admitting: Emergency Medicine

## 2010-07-18 IMAGING — CT CT STONE STUDY
1 of 2 series · 16 of 32 positions shown, 20 images · non-contrast
Comparison: none

REASON FOR EXAM: left flank pain and hematuria
COMMENTS:

PROCEDURE:     CT  - CT ABDOMEN /PELVIS WO (STONE)  - [DATE]  [DATE]
RESULT:
HISTORY: Left flank pain.

[Series 2: stone · axial · 0.79mm/px · z∈[-537,-72]mm · 16 of 169 slices shown, 20 images]
[im 7/169  soft-tissue]
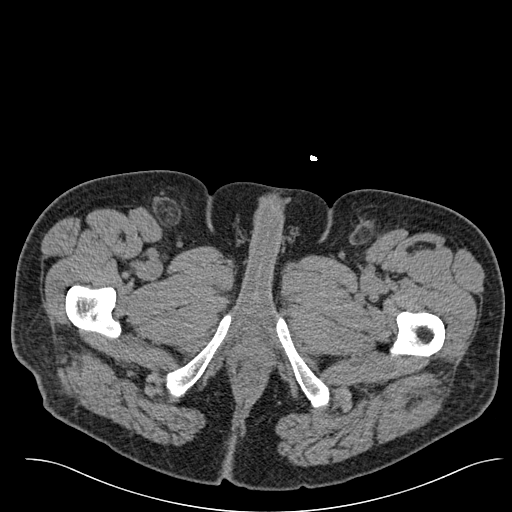
[im 7/169  bone]
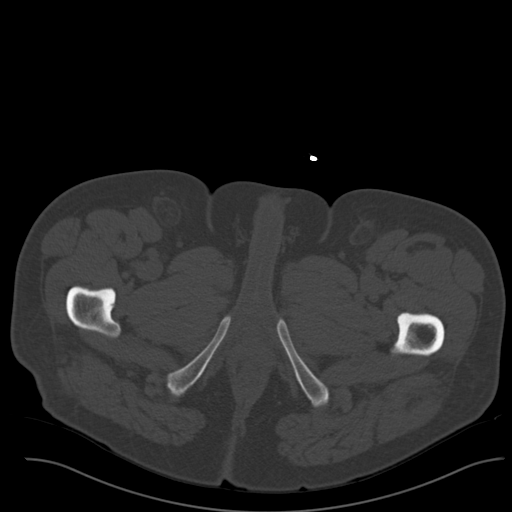
[im 19/169  soft-tissue]
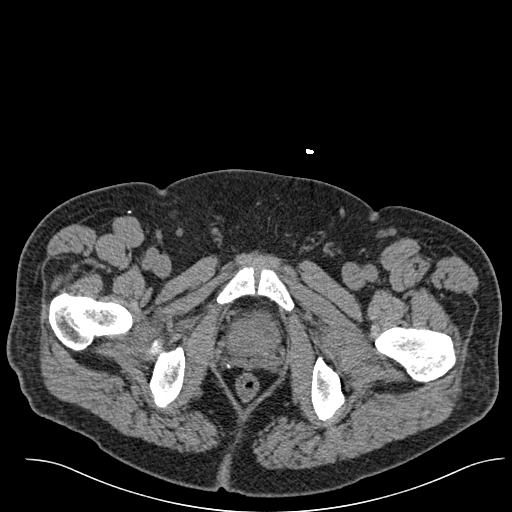
[im 32/169  soft-tissue]
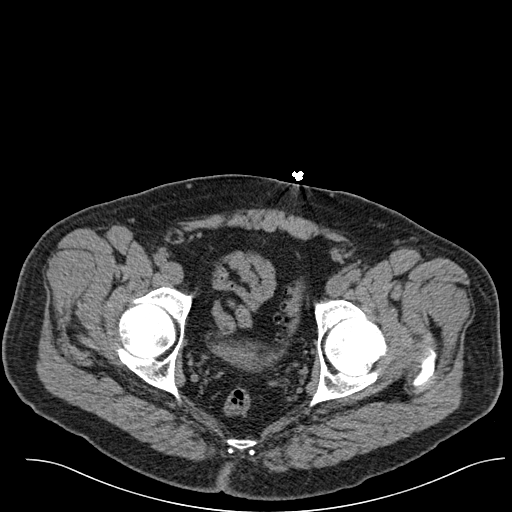
[im 44/169  soft-tissue]
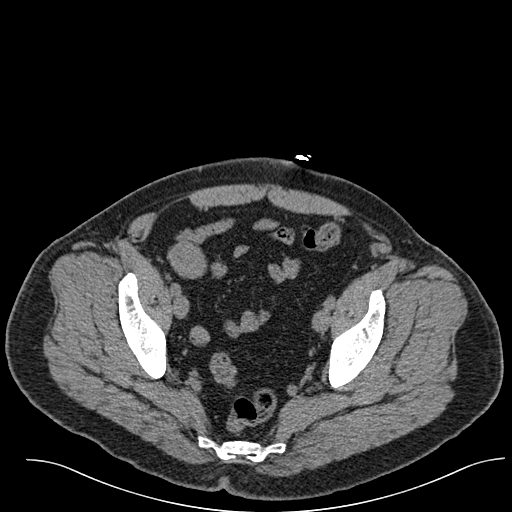
[im 57/169  soft-tissue]
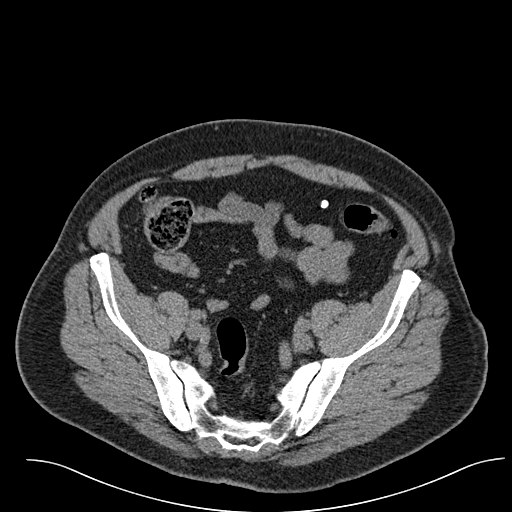
[im 69/169  soft-tissue]
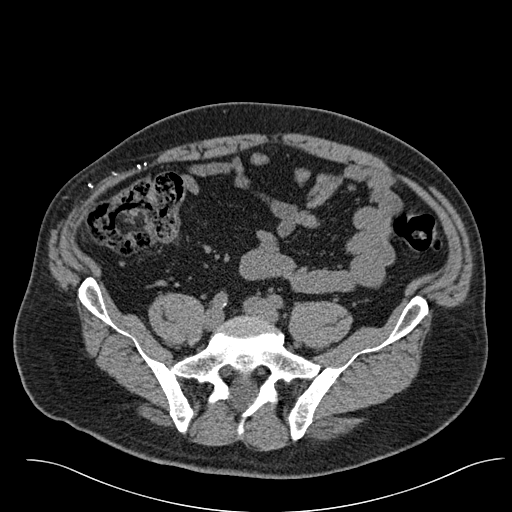
[im 81/169  soft-tissue]
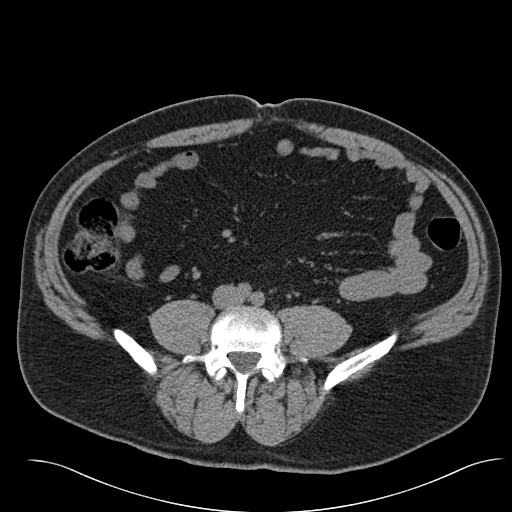
[im 88/169  soft-tissue]
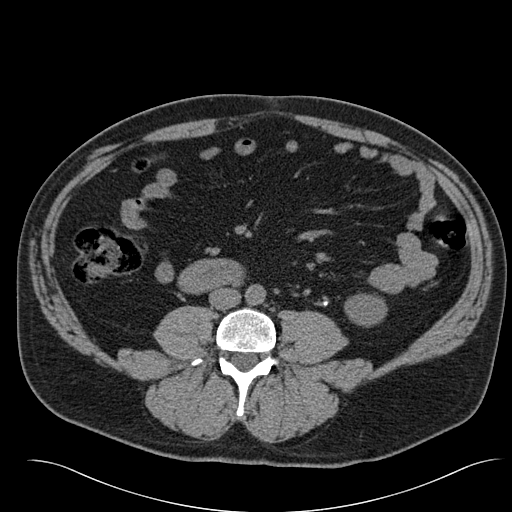
[im 100/169  soft-tissue]
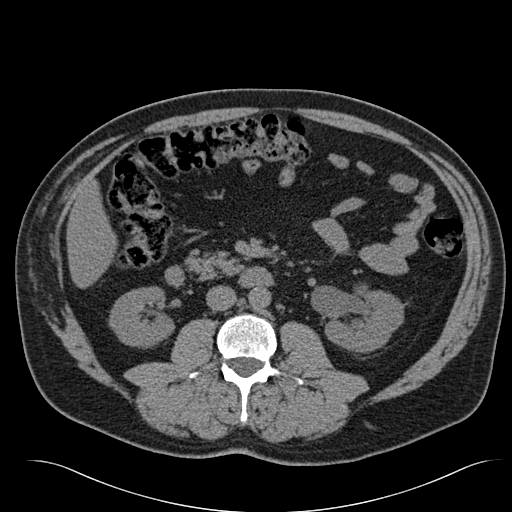
[im 100/169  bone]
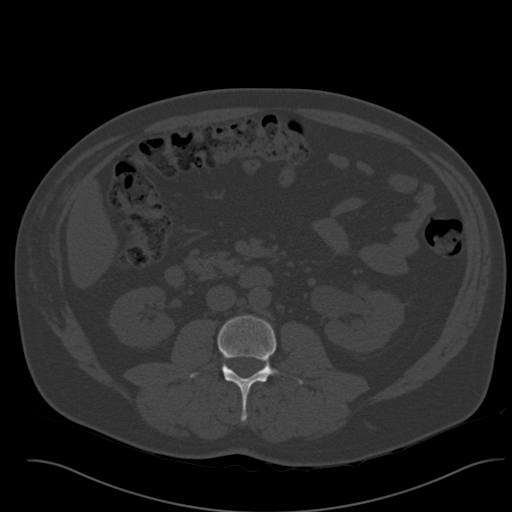
[im 113/169  soft-tissue]
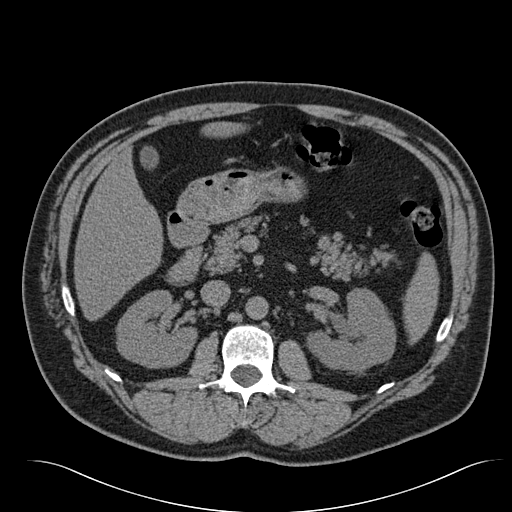
[im 125/169  soft-tissue]
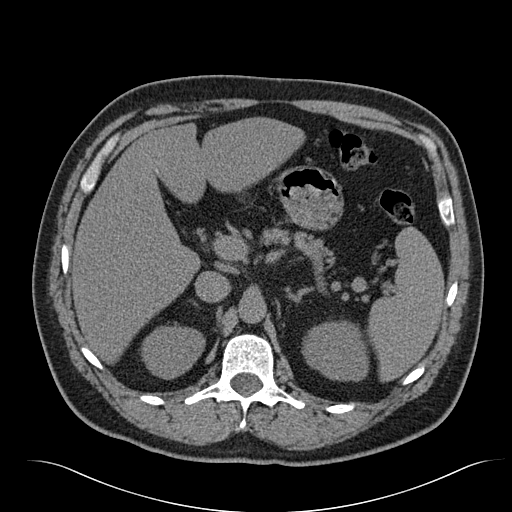
[im 137/169  soft-tissue]
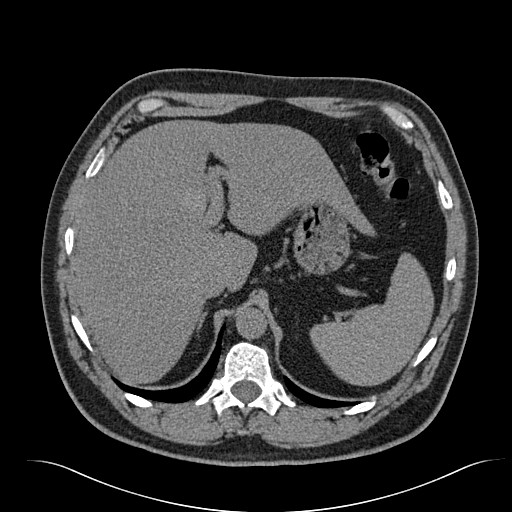
[im 144/169  lung]
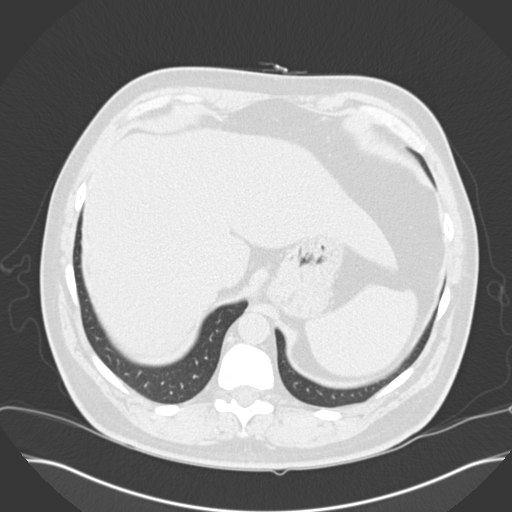
[im 150/169  soft-tissue]
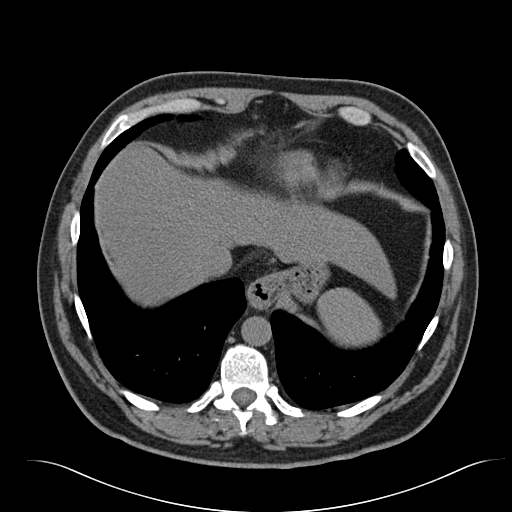
[im 150/169  lung]
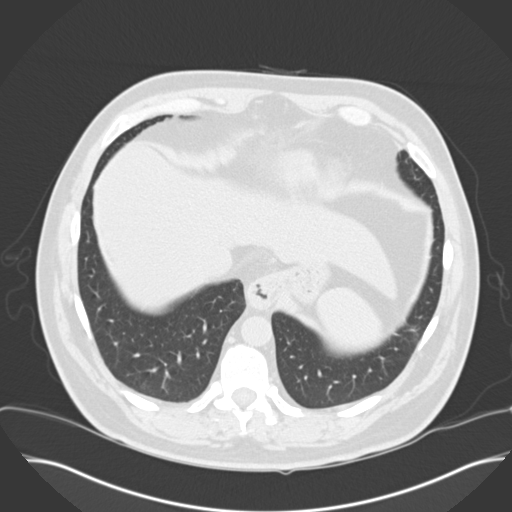
[im 156/169  lung]
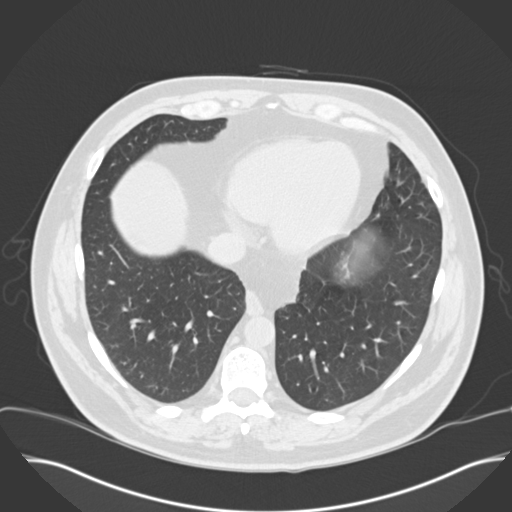
[im 162/169  soft-tissue]
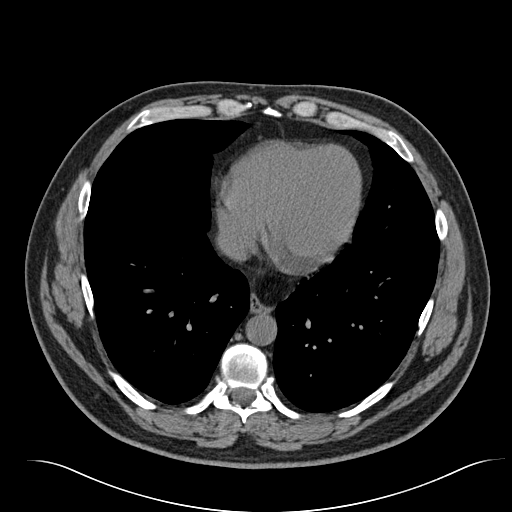
[im 162/169  lung]
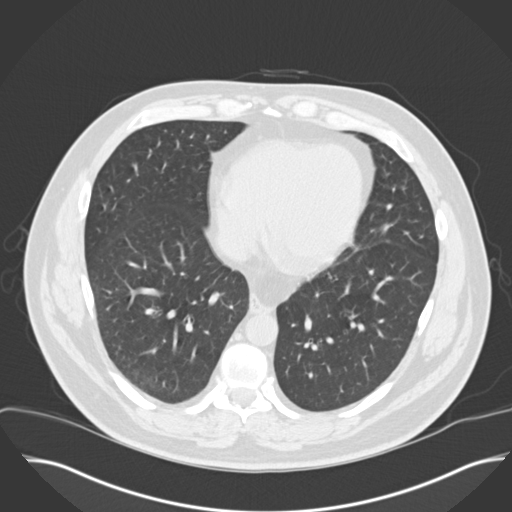

[16 of 32 positions shown; findings below may reference images not displayed]

PROCEDURE AND FINDINGS:  Standard nonenhanced CT obtained. The liver,
spleen, pancreas and adrenals are normal. The right kidney is normal. Small,
left renal caliceal stones noted. There is a small approximately 4 to 5 mm
proximal left ureteral stone with mild, left hydronephrosis. Sigmoid colonic
diverticulosis is noted. No free pelvic fluid is noted. Aorta is
nondistended. Mild basilar atelectasis is noted. Calcified pelvic densities
noted consistent with phleboliths.
IMPRESSION: There is a 4 to 5 mm, proximal left ureteral stone with
mild, left hydronephrosis.

## 2010-07-22 ENCOUNTER — Emergency Department: Payer: Self-pay | Admitting: Emergency Medicine

## 2010-07-24 ENCOUNTER — Ambulatory Visit: Payer: Self-pay | Admitting: Urology

## 2010-07-24 IMAGING — CR DG ABDOMEN 1V
1 series · 3 of 3 positions shown · non-contrast
Comparison: none

REASON FOR EXAM: kidney stone
COMMENTS:

[Series 1: view not recorded · 0.17mm/px · 3 of 3 slices shown]
[im 1/3]
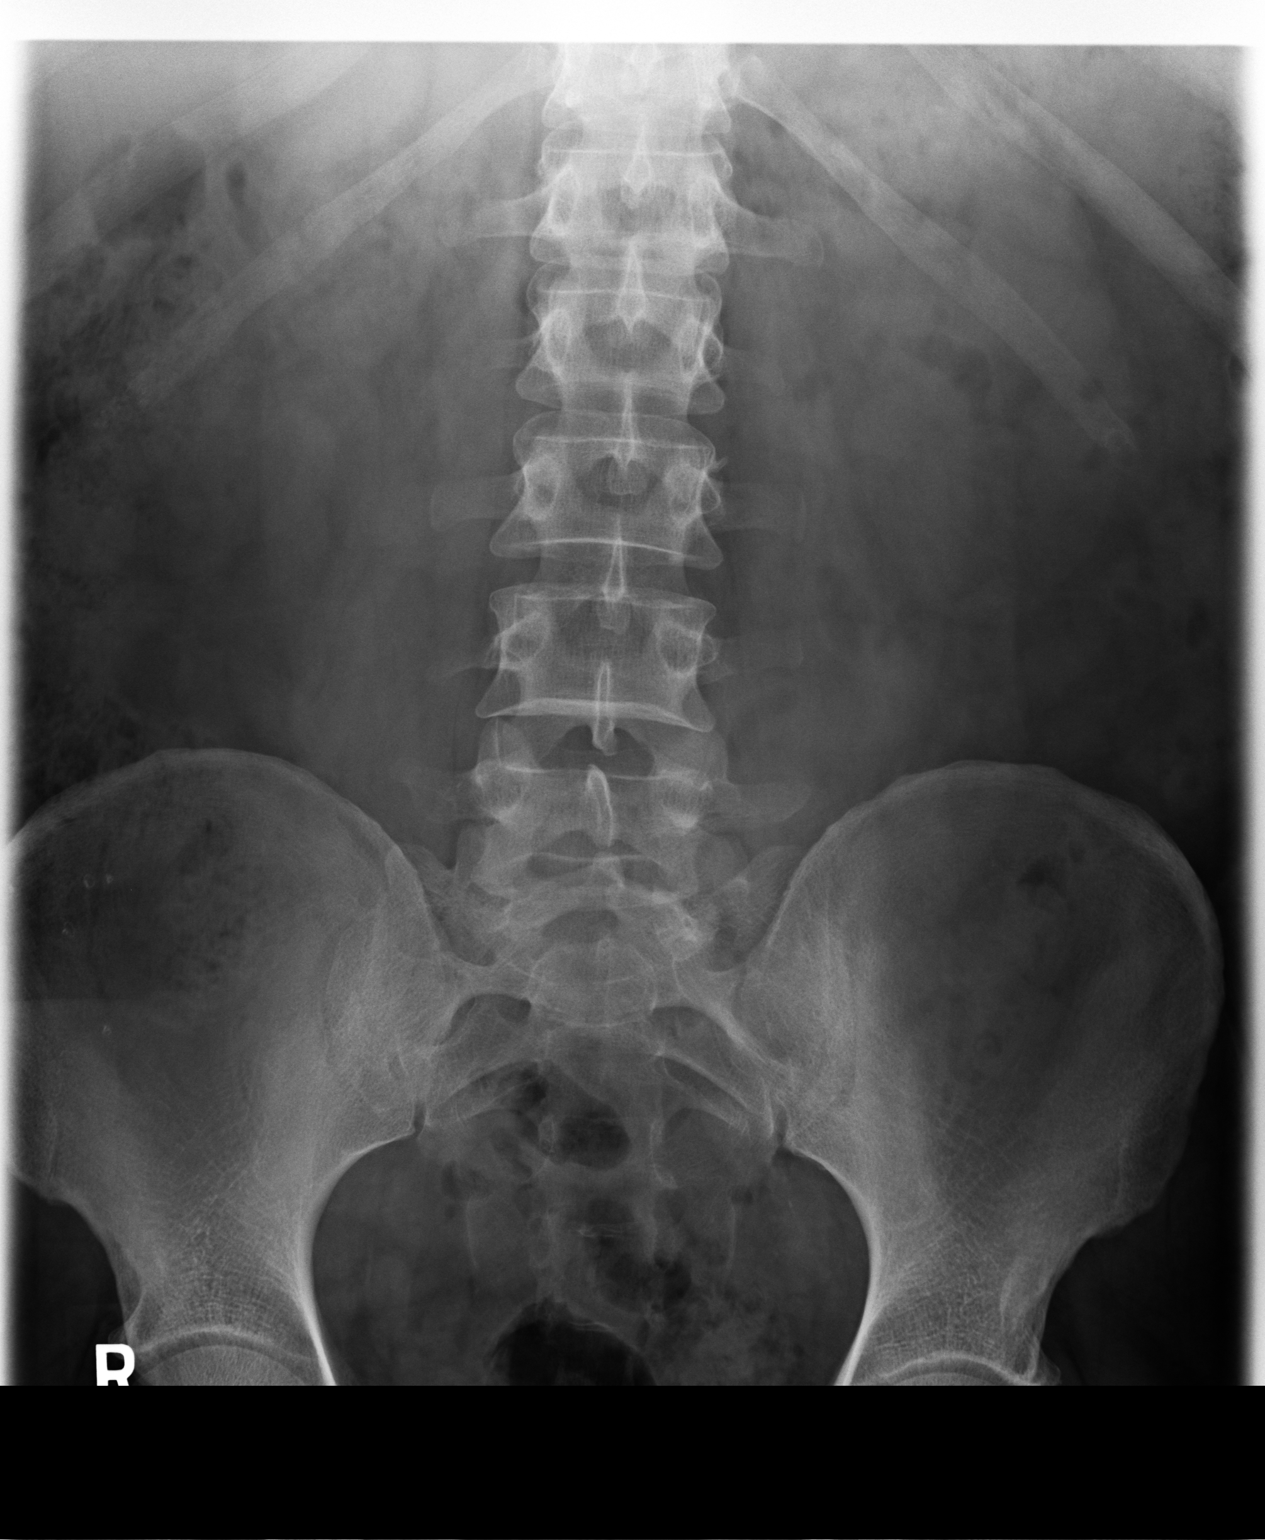
[im 2/3]
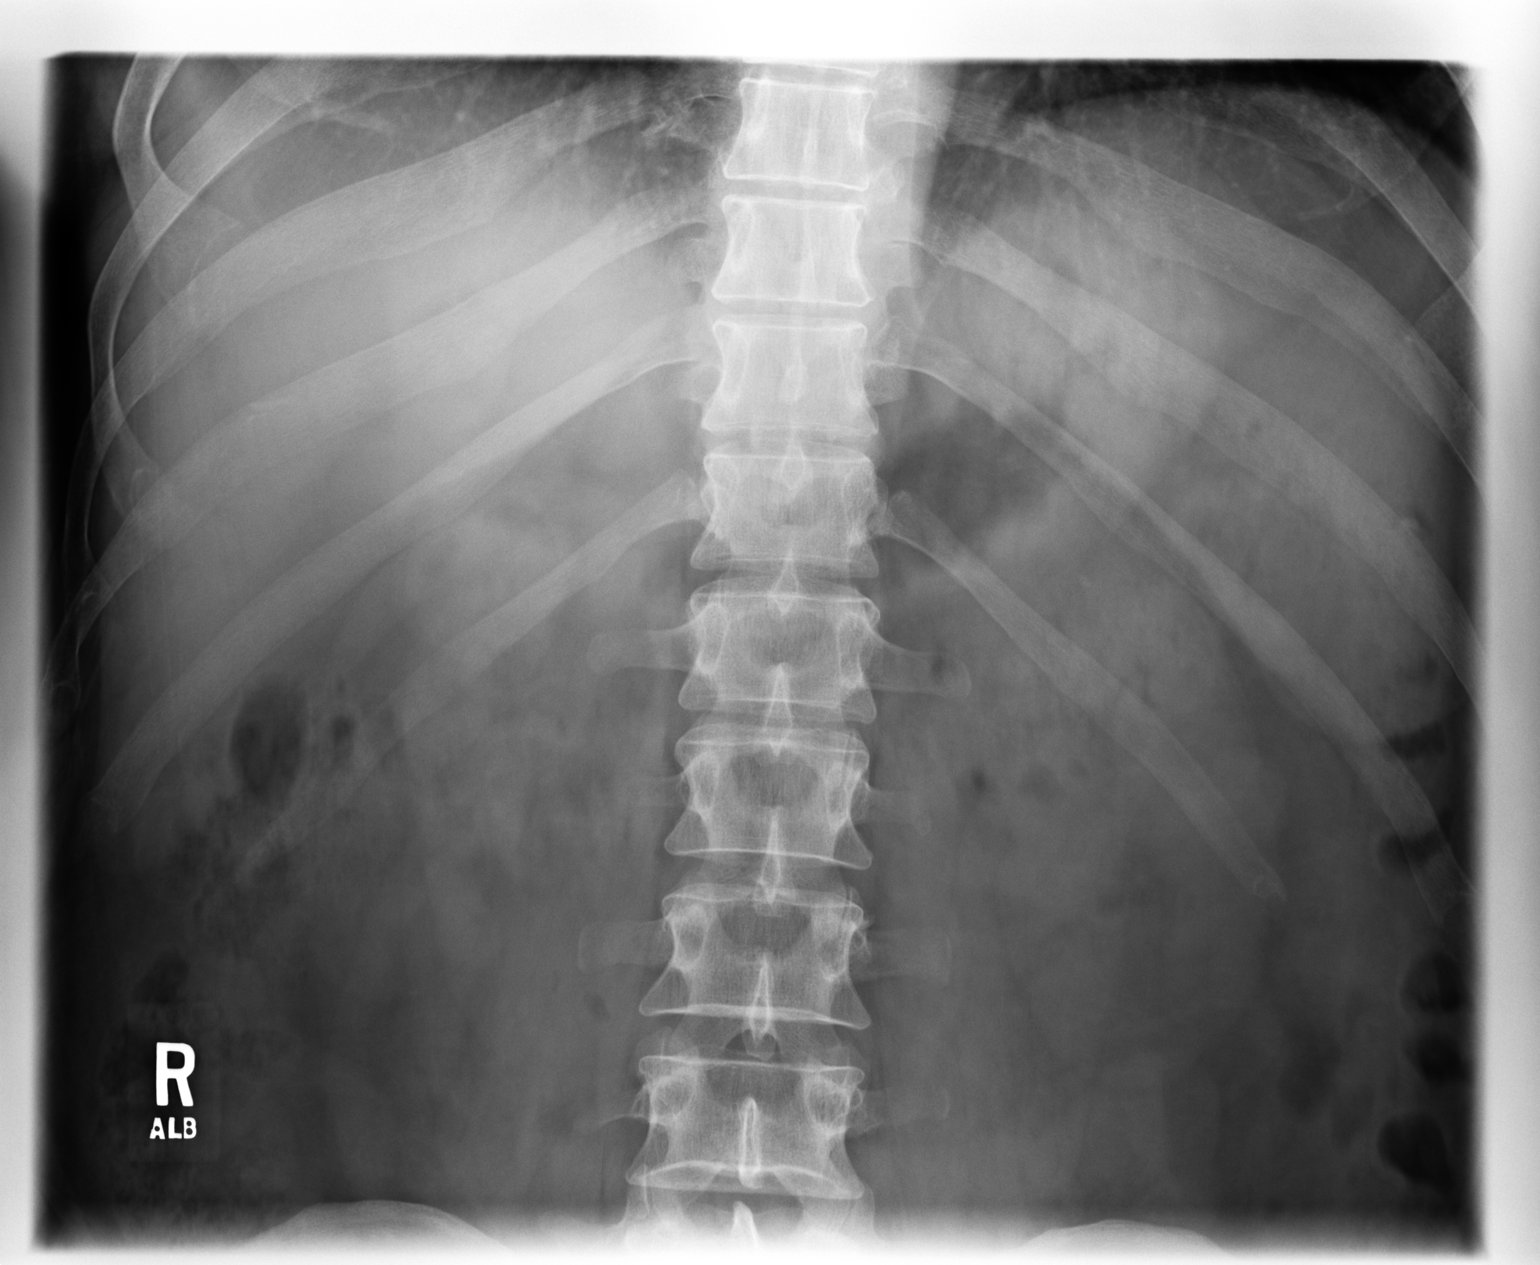
[im 3/3]
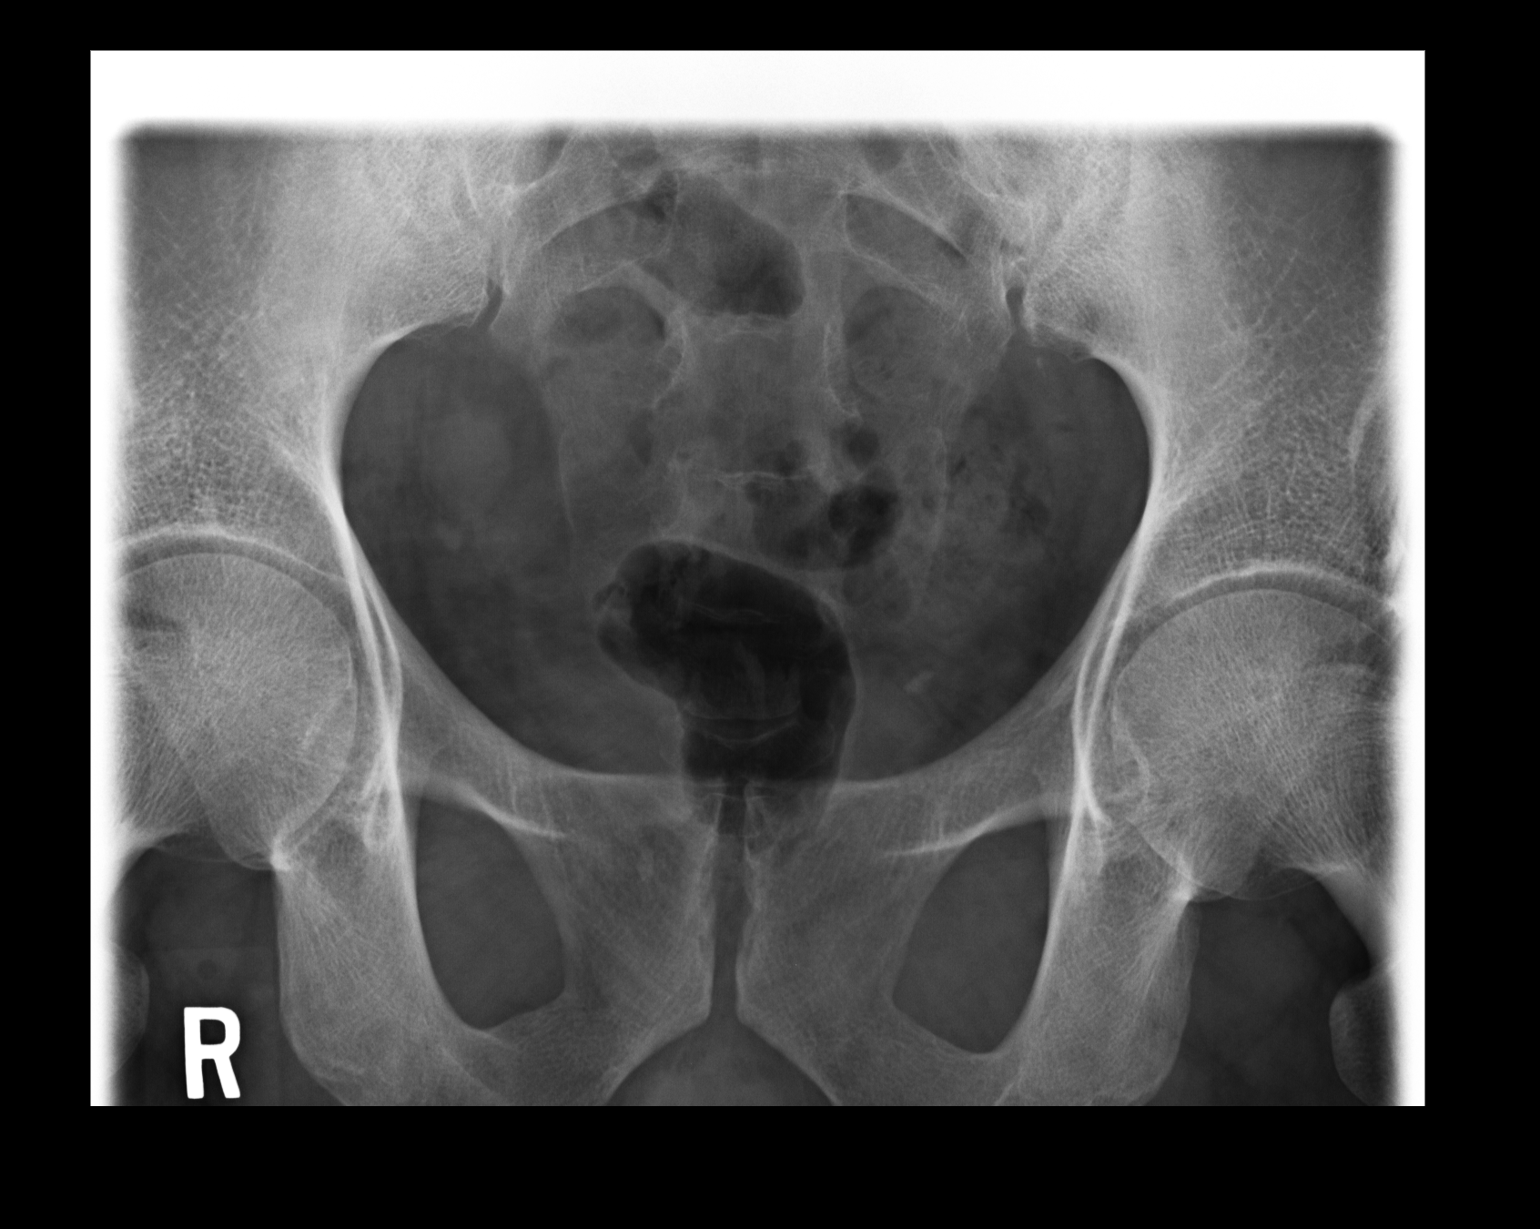

[3 of 3 positions shown; findings below may reference images not displayed]

PROCEDURE:     MDR - MDR KIDNEY URETER BLADDER  - [DATE] [DATE]

RESULT:     There is a very faint 2 to 3 mm density projected at the upper
pole of the left kidney suspicious for a tiny left renal stone. No other
renal stones are identified. There is a 6 mm calcification projected low in
the left pelvic area that is suspicious for a distal left ureteral stone.
The previously noted proximal left ureteral stone observed at CT has
apparently moved inferiorly. No right renal or right ureteral stones are
seen.
IMPRESSION: 1. Probable left nephrolithiasis.
2. Probable distal left ureterolithiasis.

## 2010-07-25 ENCOUNTER — Ambulatory Visit: Payer: Self-pay | Admitting: Internal Medicine

## 2010-07-28 ENCOUNTER — Ambulatory Visit: Payer: Self-pay | Admitting: Urology

## 2010-07-28 IMAGING — CR DG ABDOMEN 1V
1 series · 2 of 2 positions shown · non-contrast
Comparison: none

REASON FOR EXAM: kidney stone
COMMENTS:

[Series 1: view not recorded · 0.17mm/px · 2 of 2 slices shown]
[im 1/2]
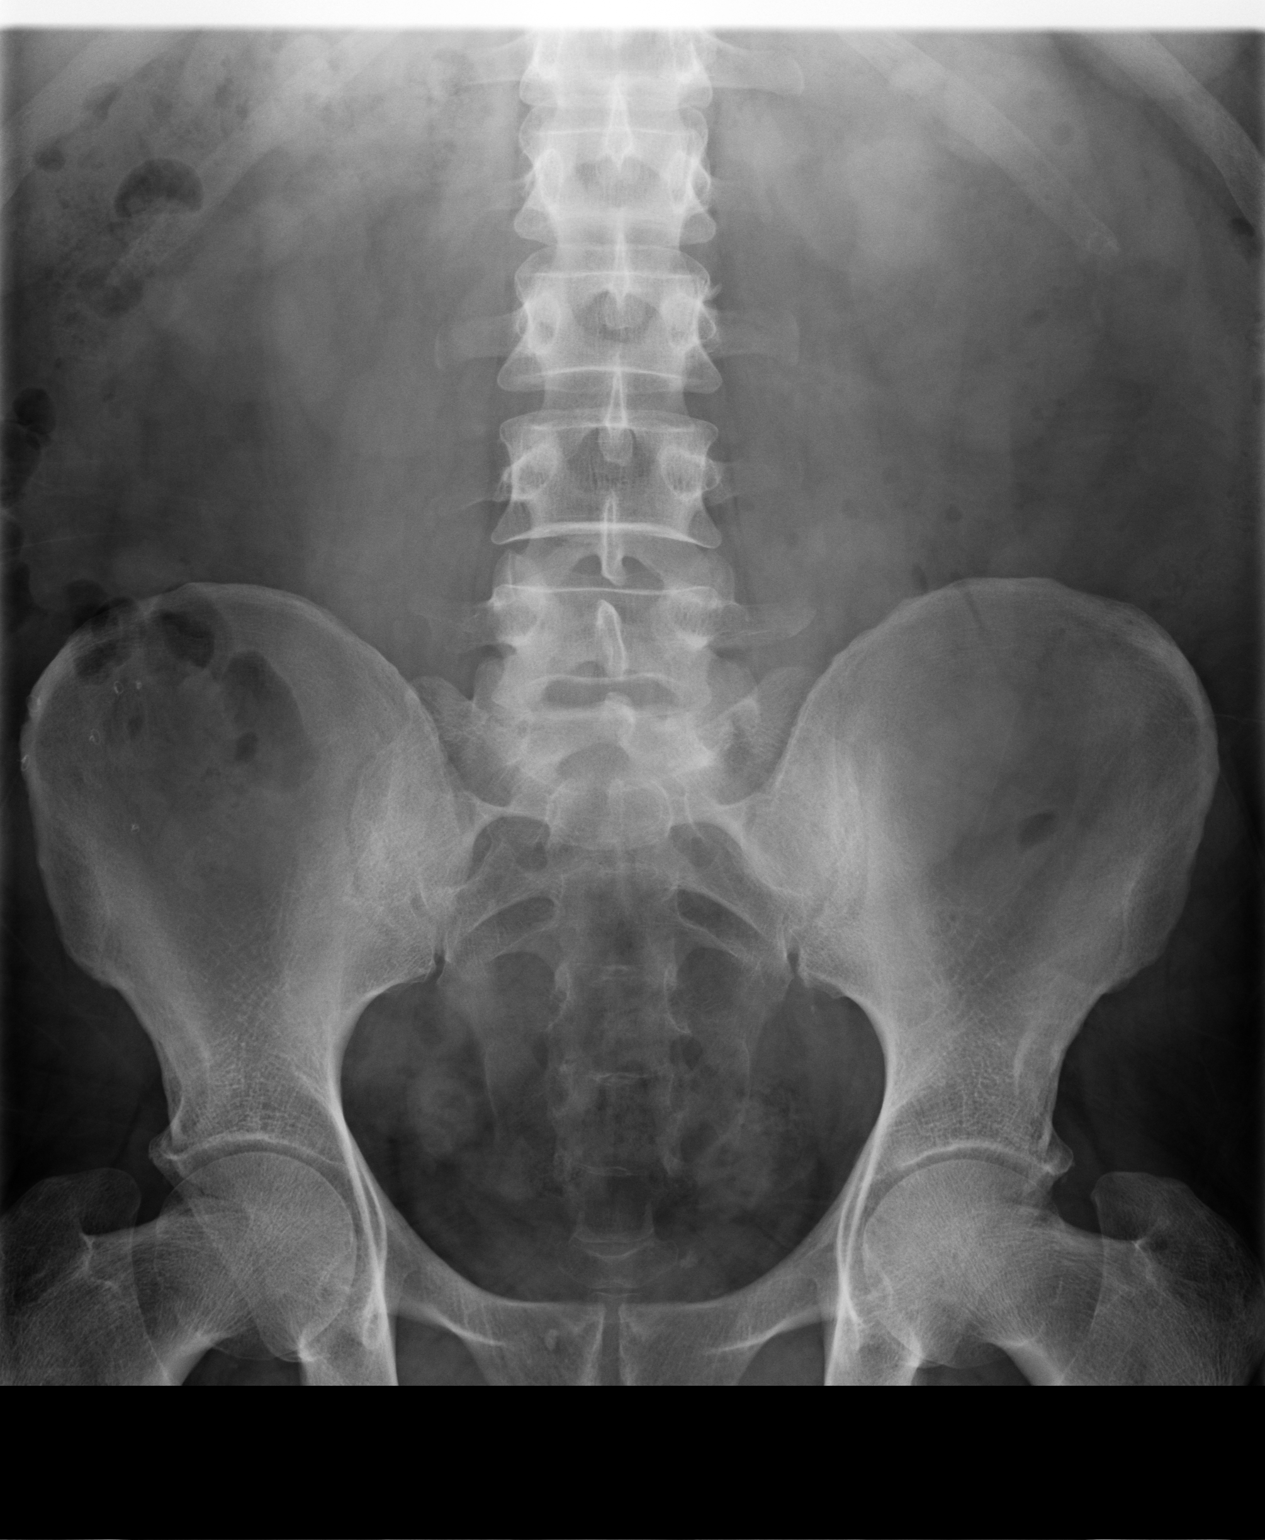
[im 2/2]
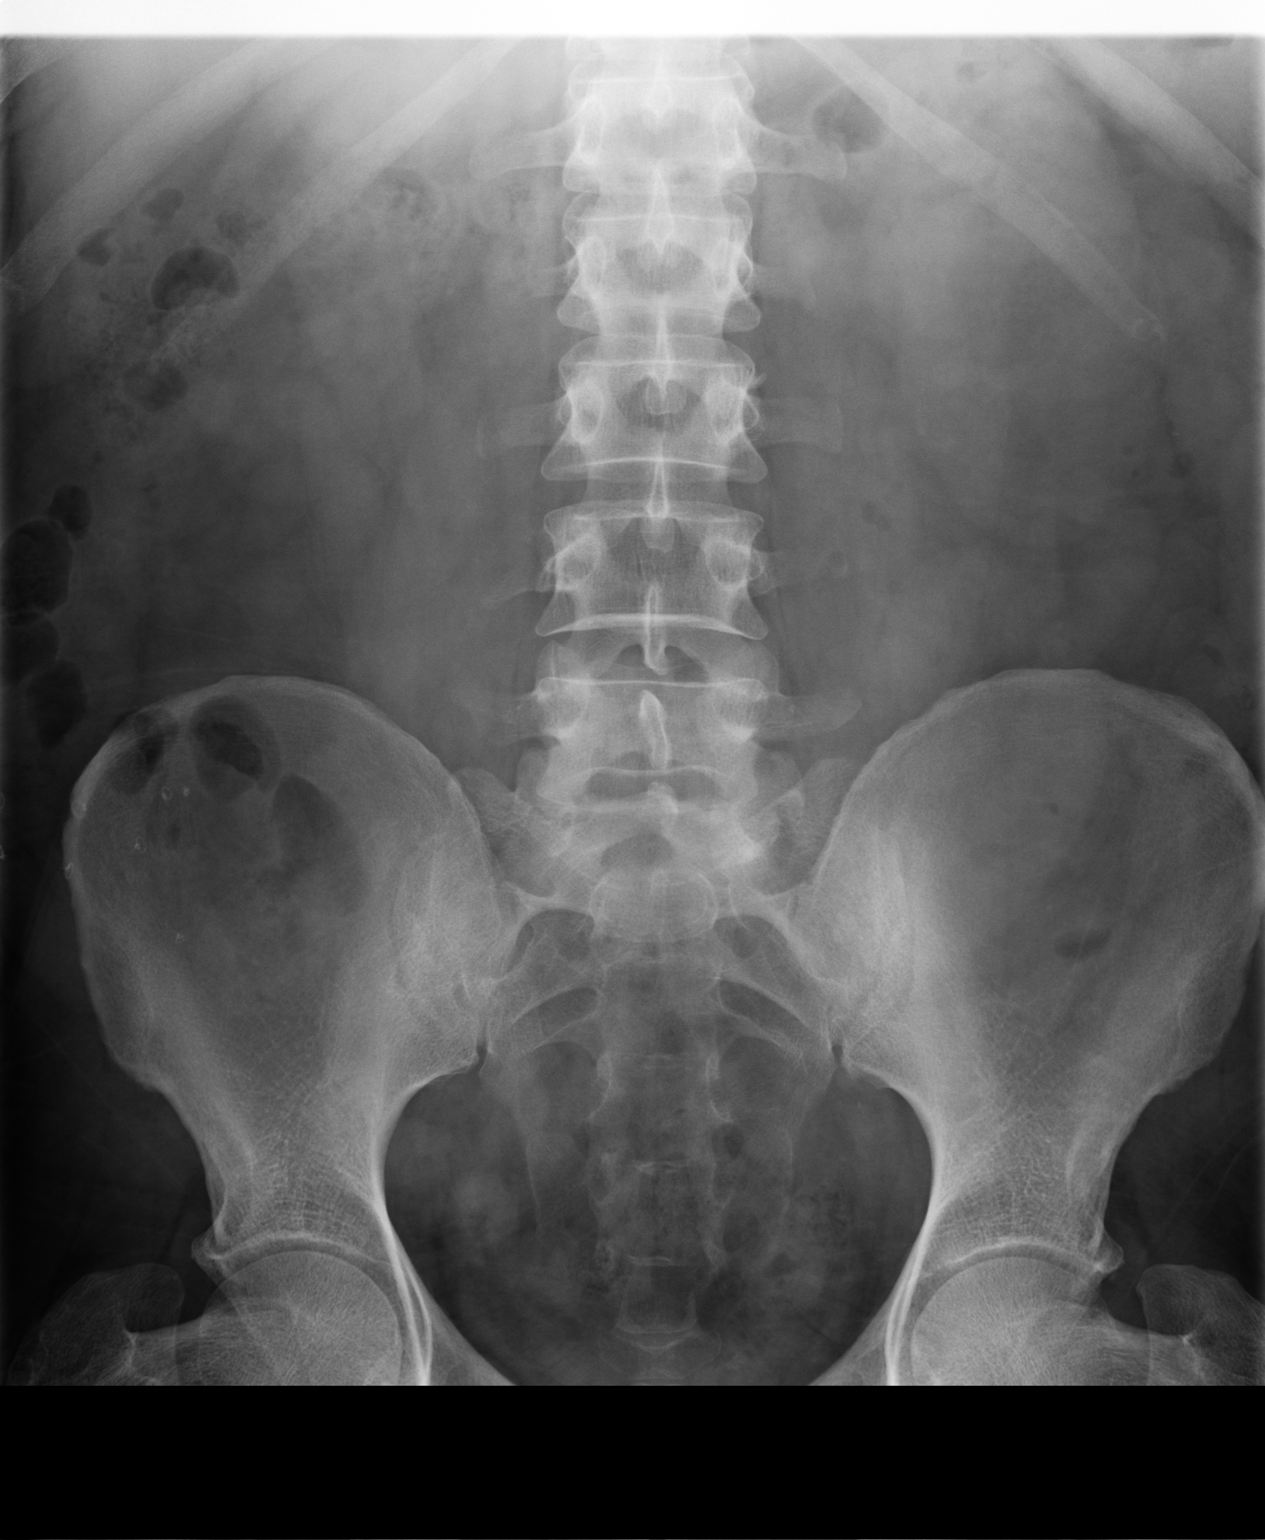

[2 of 2 positions shown; findings below may reference images not displayed]

PROCEDURE:     MDR - MDR KIDNEY URETER BLADDER  - [DATE]  [DATE]

RESULT:     Comparison is made to prior study dated [DATE].

A paucity of bowel gas is appreciated within nondilated loops of large and
small bowel.

There does not appear to be radiographic evidence of calcified densities
projecting within the right or left renal fossa region.

The previously described density within the left lower portion of the true
pelvis is possibly vaguely appreciated on the present study. No further
radiographic evidence of renal calculus disease is appreciated. The
visualized bony skeleton is unremarkable.
IMPRESSION: 1. Possibly small, 5 to 6 mm calculus vaguely appreciated within the base of
the urinary bladder on the left.

## 2010-09-23 ENCOUNTER — Ambulatory Visit: Payer: Self-pay | Admitting: Internal Medicine

## 2010-11-04 ENCOUNTER — Emergency Department: Payer: Self-pay | Admitting: Emergency Medicine

## 2010-11-04 IMAGING — CR DG CHEST 2V
1 series · 2 of 2 positions shown · non-contrast
Comparison: none

REASON FOR EXAM: cough
COMMENTS:

[Series 1: view not recorded · 0.17mm/px · 2 of 2 slices shown]
[im 1/2]
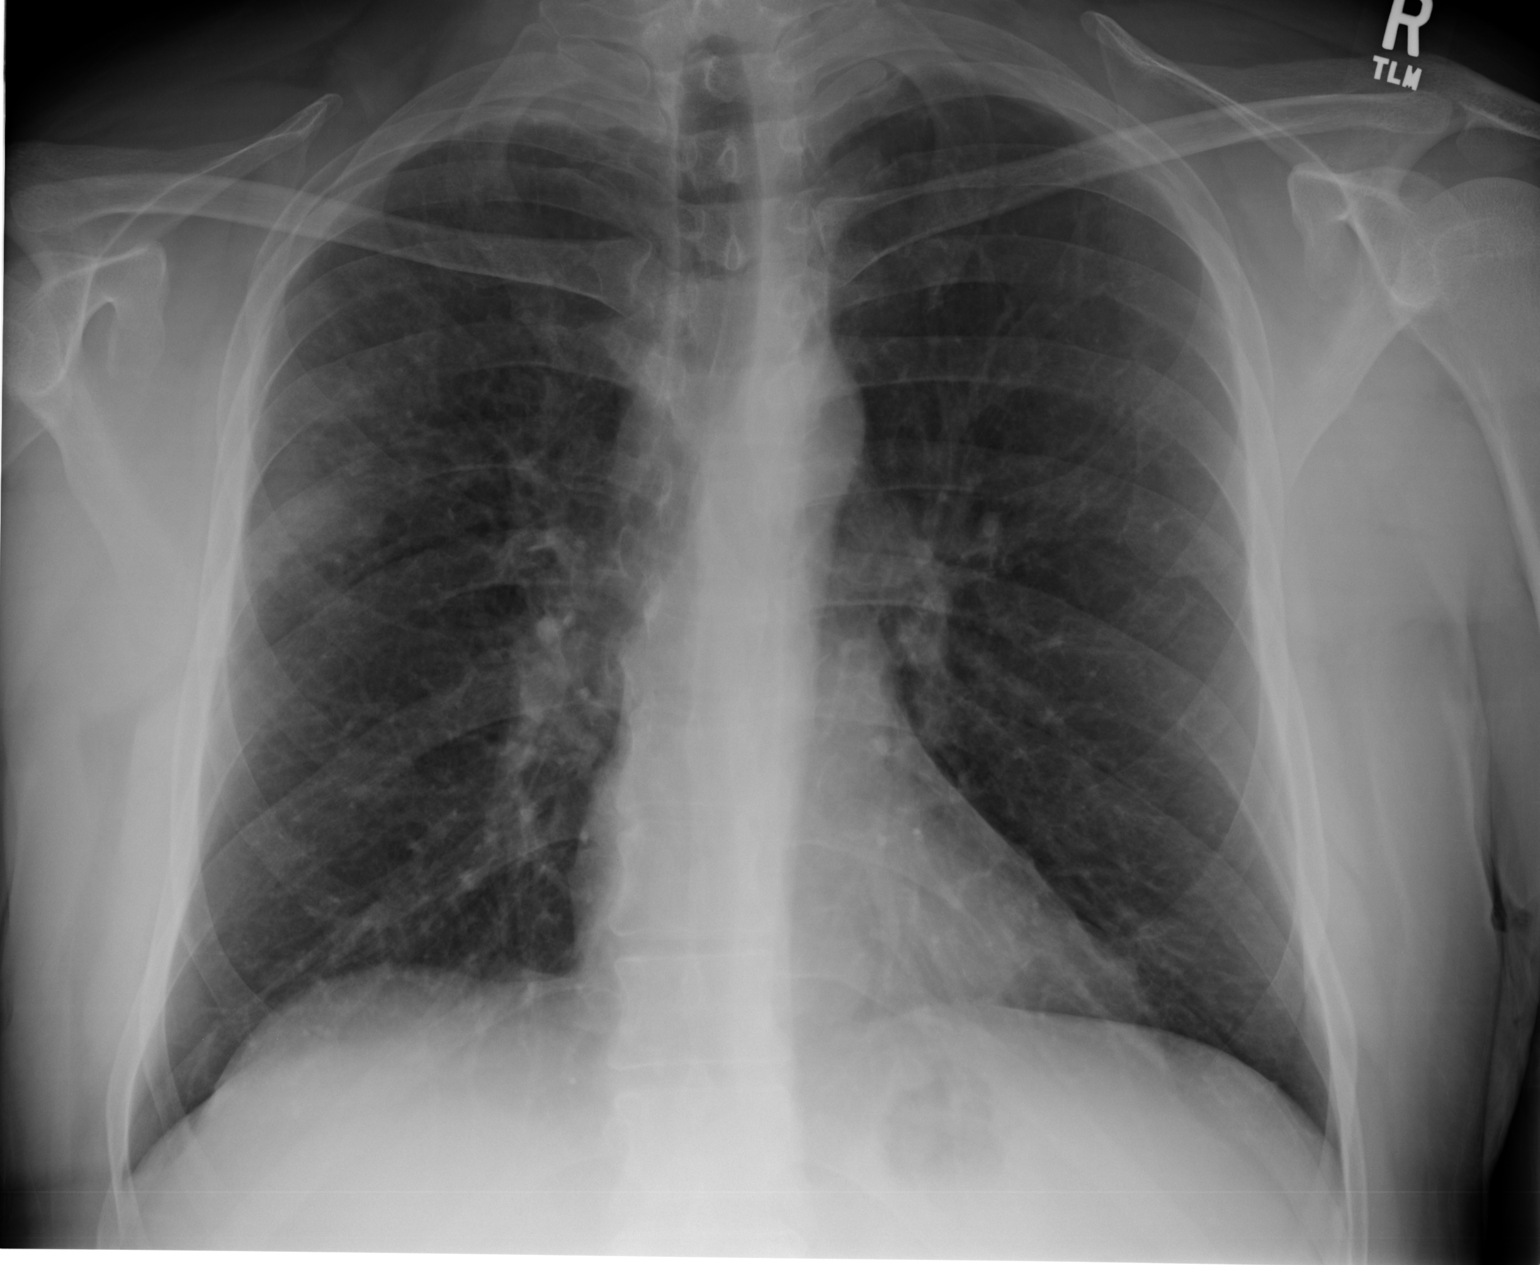
[im 2/2]
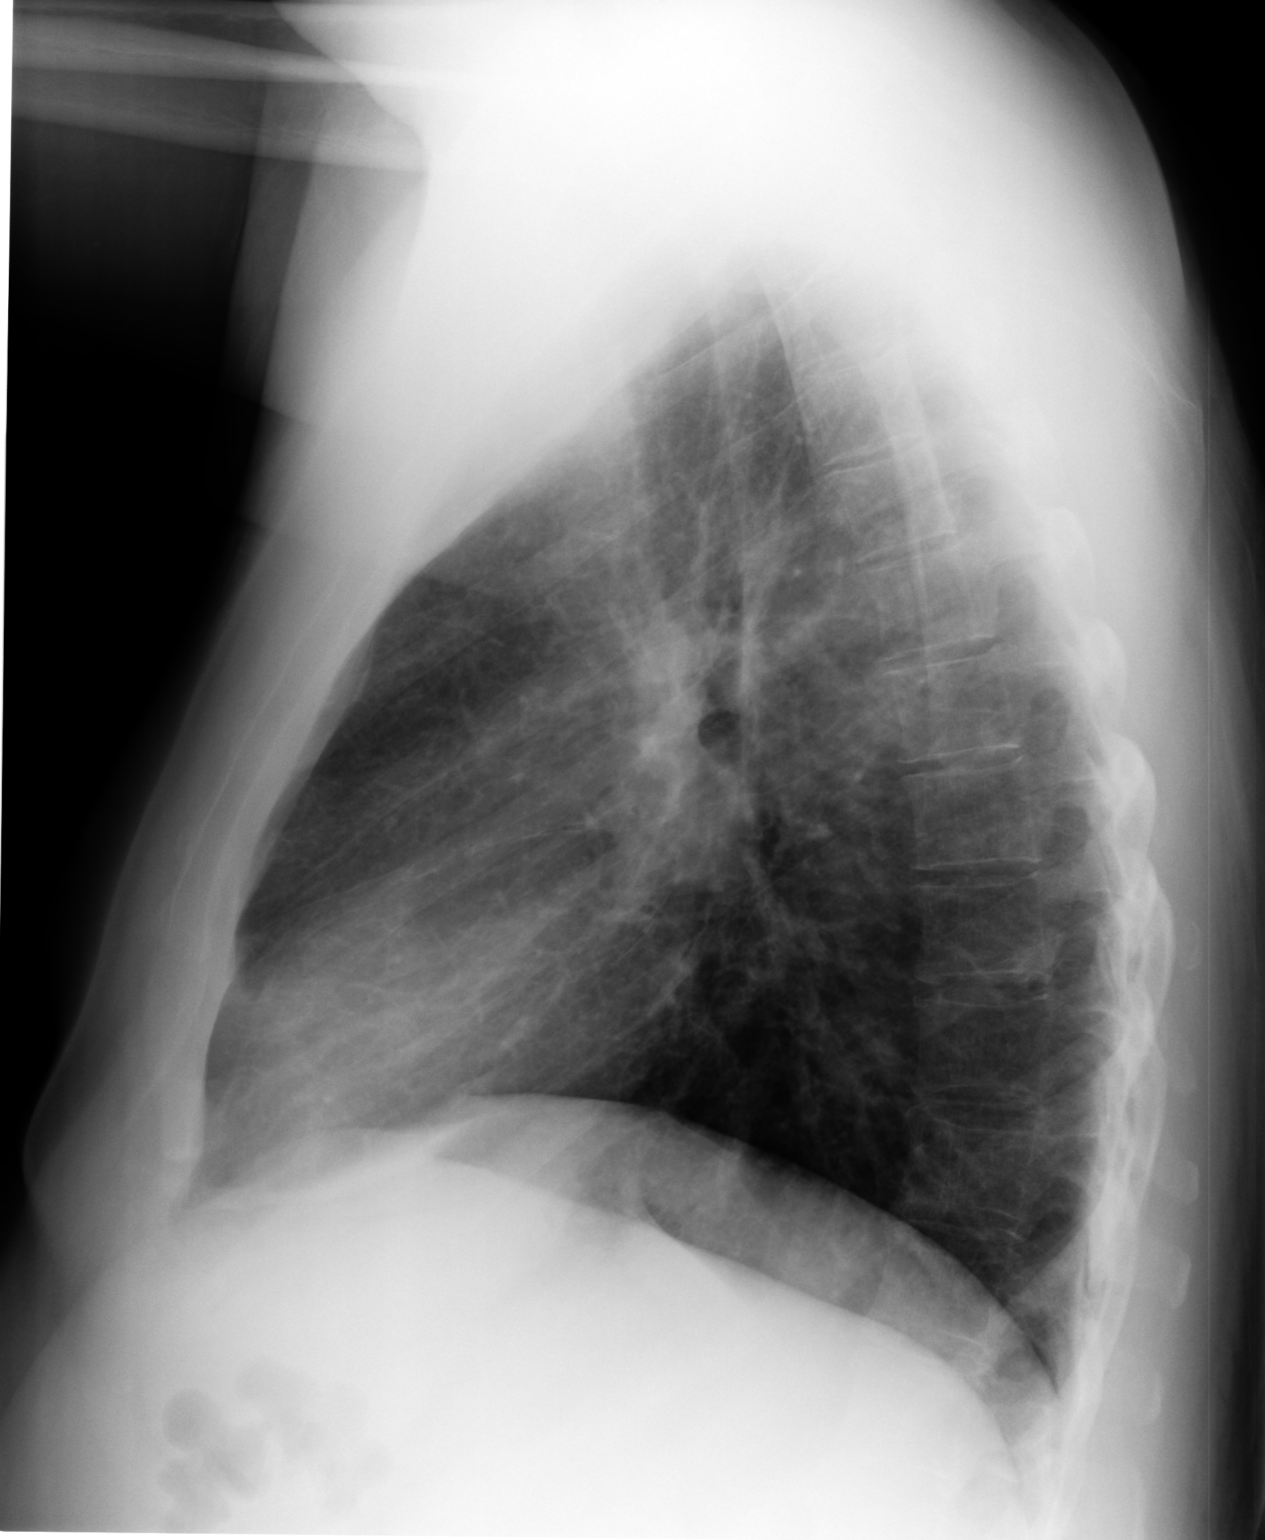

[2 of 2 positions shown; findings below may reference images not displayed]

PROCEDURE:     DXR - DXR CHEST PA (OR AP) AND LATERAL  - [DATE] [DATE]

RESULT:     Comparison is made to the prior exam of [DATE]. There is a
small patchy infiltrate laterally in the right upper lobe. The findings are
suspicious for pneumonia. Follow-up examination to document clearing is
recommended. The lung fields otherwise are clear. Heart size is normal. The
mediastinal and osseous structures show no significant abnormalities.
IMPRESSION: 1.     There is a hazy right upper lobe infiltrate suspicious for pneumonia.
Follow-up examination after treatment is recommended.

## 2011-03-27 ENCOUNTER — Ambulatory Visit: Payer: Self-pay

## 2011-03-27 IMAGING — CR DG RIBS 2V*R*
1 series · 3 of 3 positions shown · non-contrast
Comparison: none

REASON FOR EXAM: c/o R sided rib pain since last night.
COMMENTS:

PROCEDURE:     MDR - MDR RIBS RIGHT UNLILATERAL  - [DATE] [DATE]
RESULT:     Multiple views of the right ribs show no fracture or other acute
bony abnormality. No lytic or blastic lesions are identified.

[Series 1: pa · 0.17mm/px · 3 of 3 slices shown]
[im 1/3]
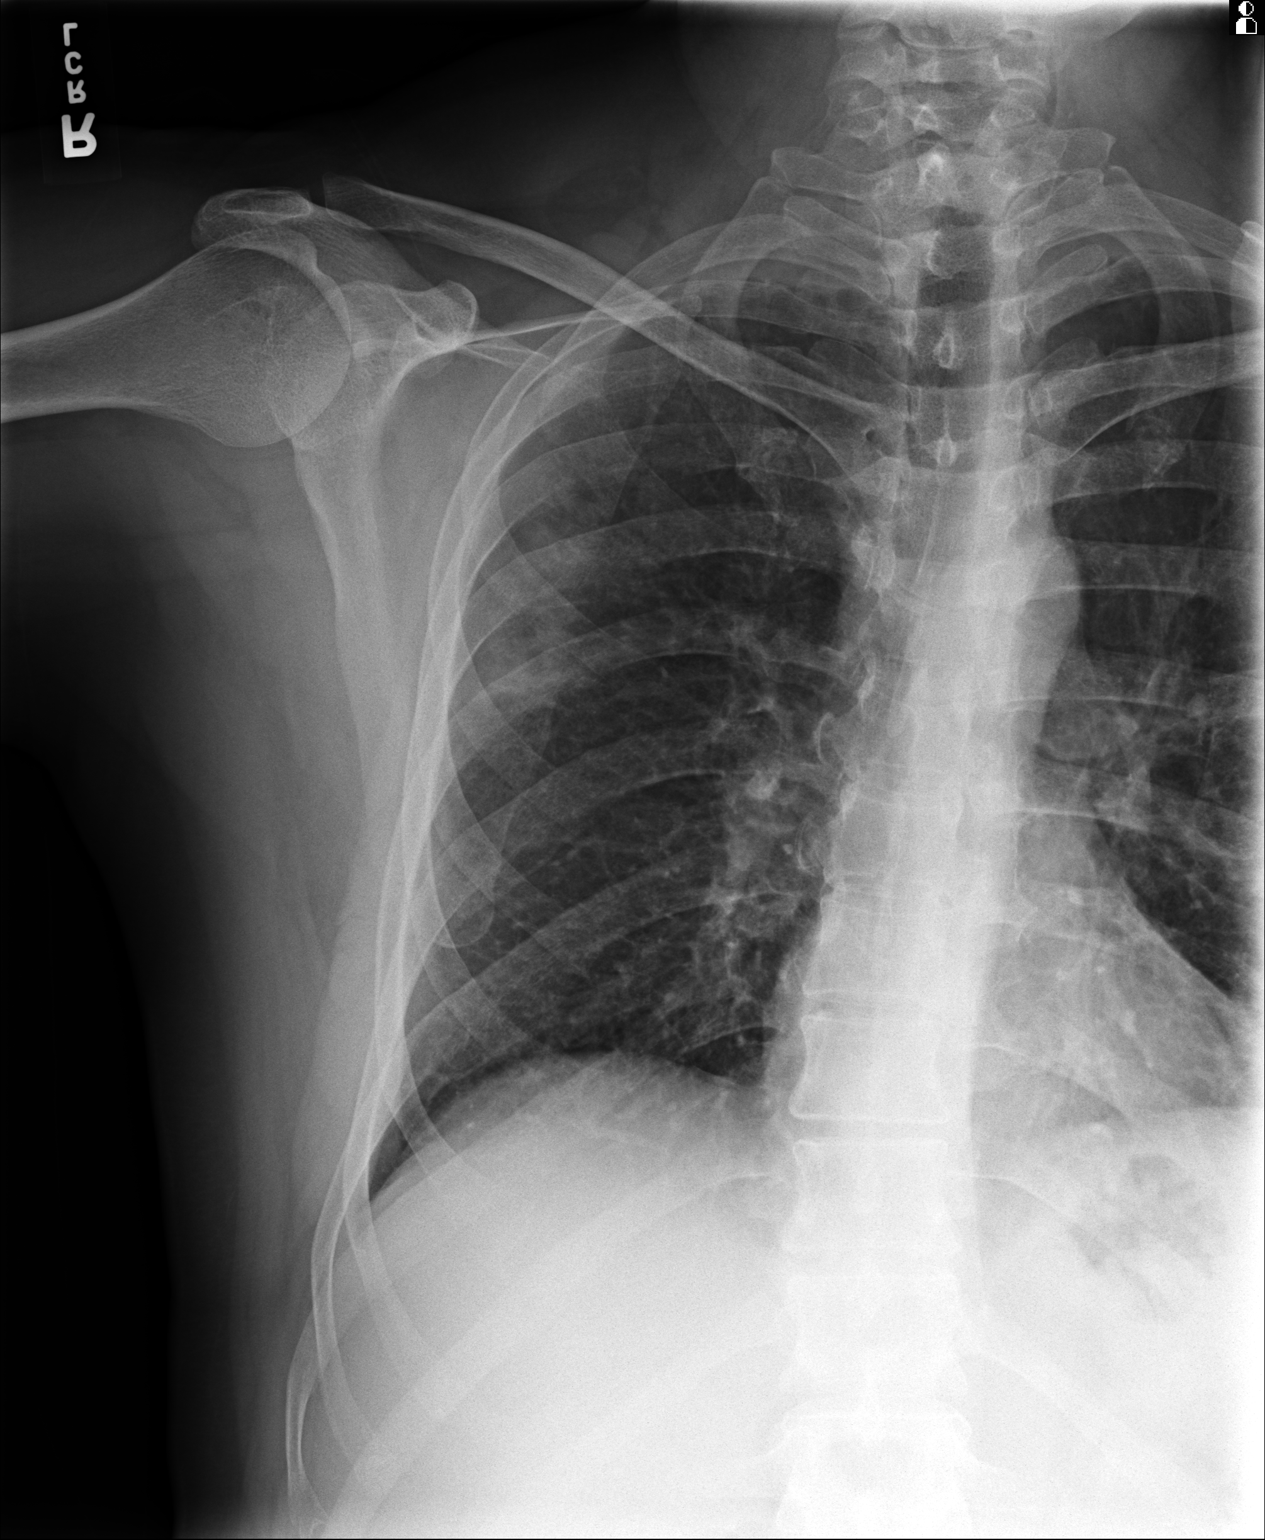
[im 2/3]
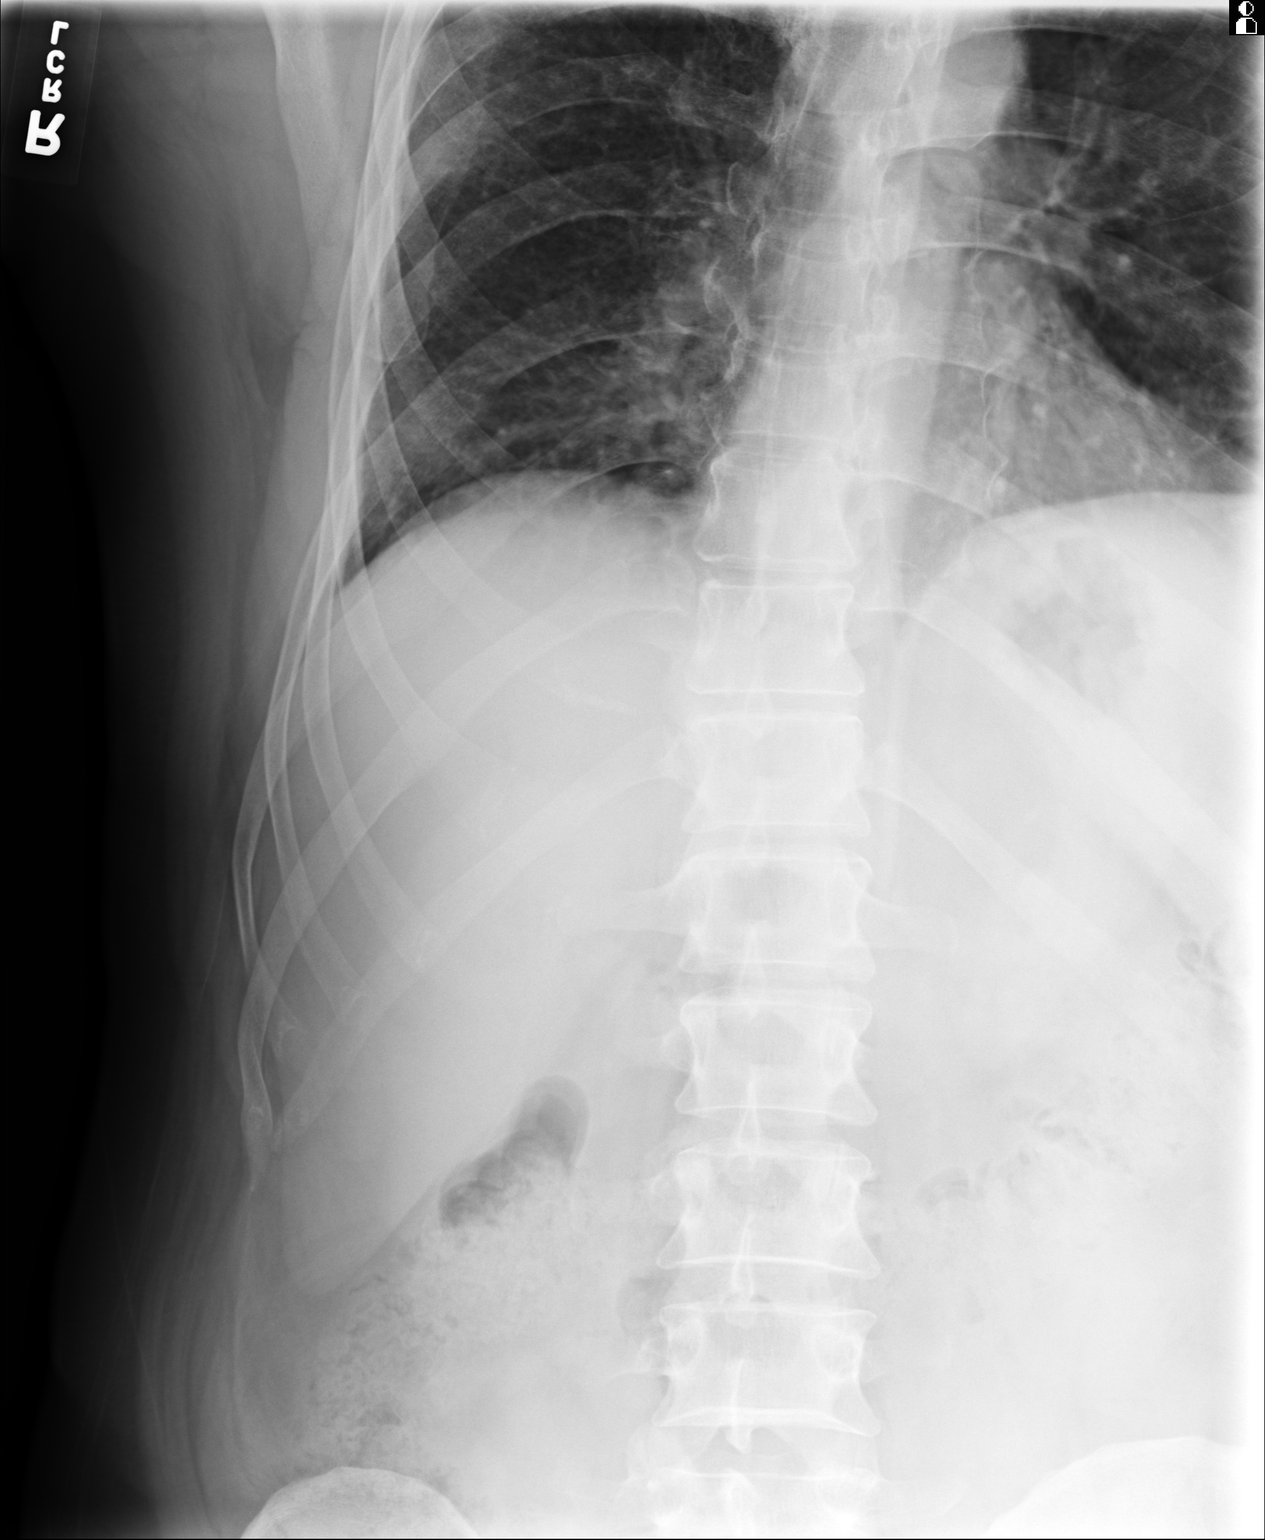
[im 3/3]
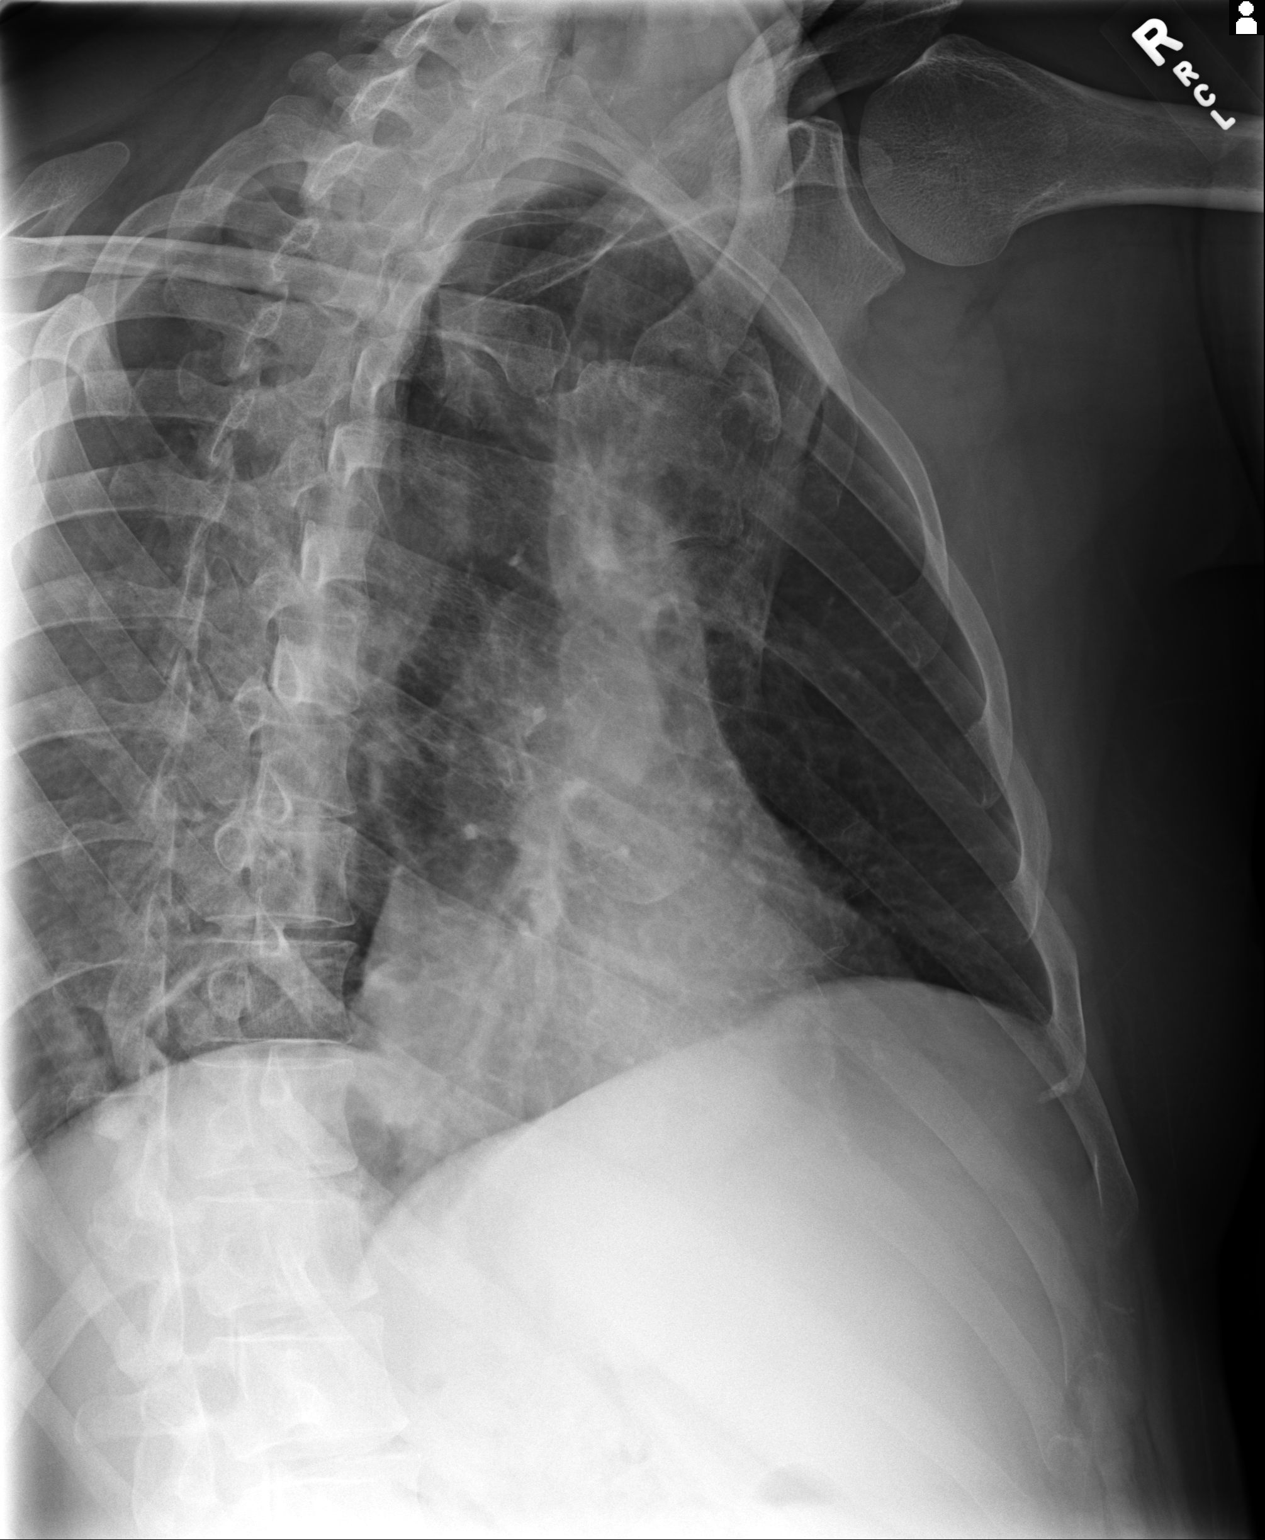

[3 of 3 positions shown; findings below may reference images not displayed]

IMPRESSION: 1.     No significant abnormalities of the right ribs are identified.

## 2011-03-27 IMAGING — CR DG CHEST 2V
1 series · 2 of 2 positions shown · non-contrast
Comparison: none

REASON FOR EXAM: cough off and on for 6 weeks.  Hx of pneumonia
COMMENTS:

[Series 1: pa · 0.17mm/px · 2 of 2 slices shown]
[im 1/2]
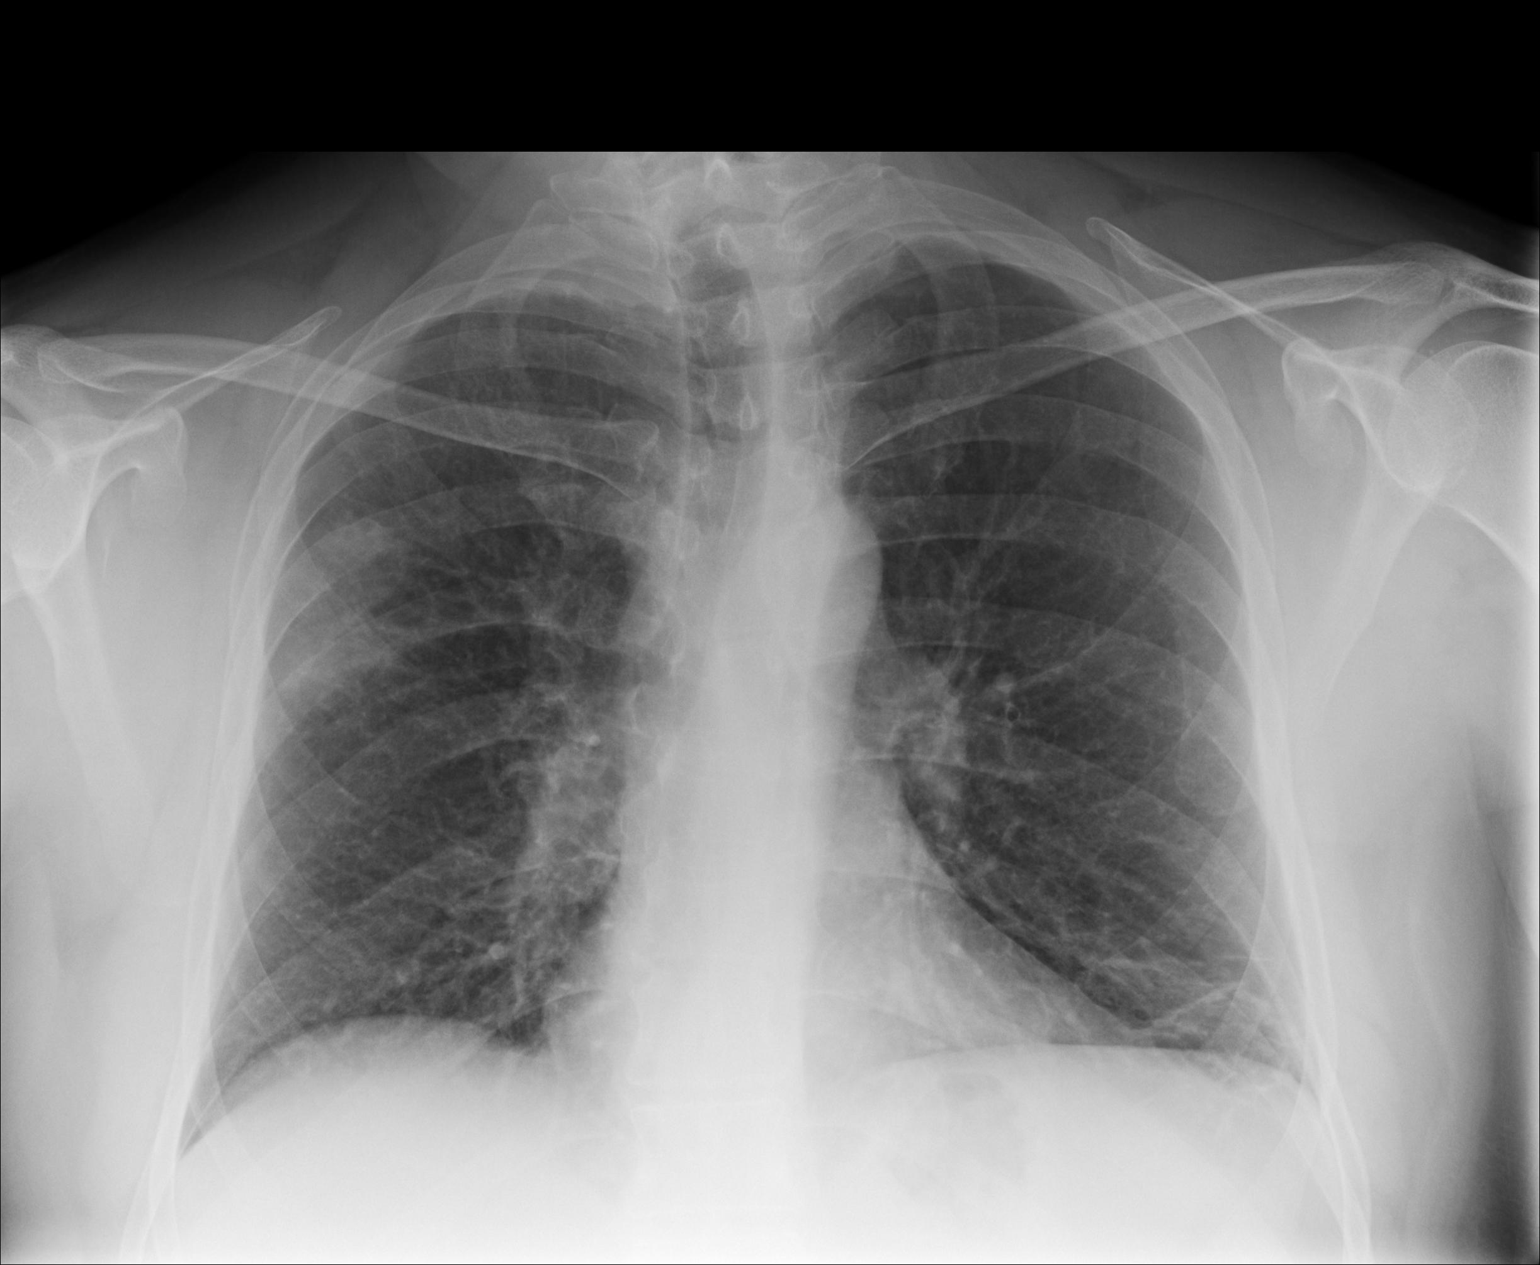
[im 2/2]
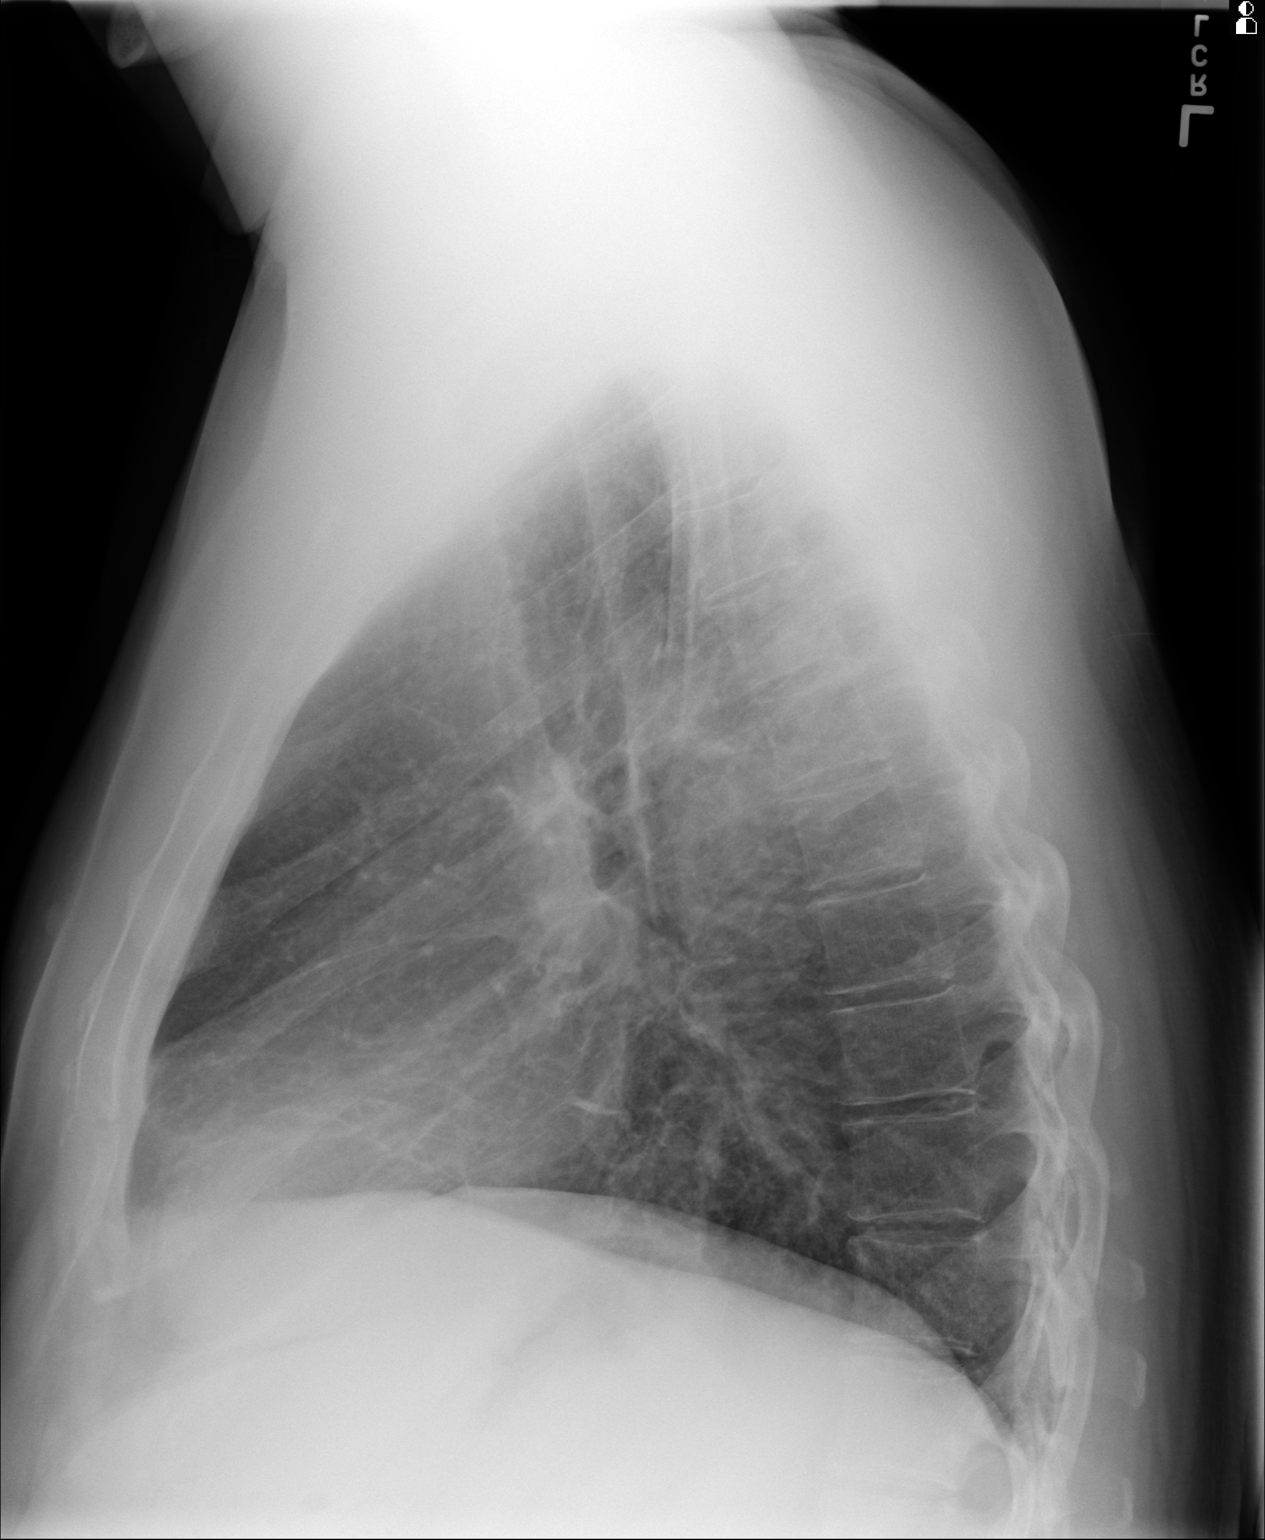

[2 of 2 positions shown; findings below may reference images not displayed]

PROCEDURE:     MDR - MDR CHEST PA(OR AP) AND LATERAL  - [DATE] [DATE]

RESULT:     Comparison is made to the prior exam of [DATE]. There is
again noted a hazy patchy infiltrate laterally in the right lung. This has
not changed appreciably as compared to the prior exam. In view of the lack
of clearing, further evaluation of the chest by chest CT is recommended. The
current exam shows minimal atelectasis or infiltrate at the left base. Heart
size is normal.
IMPRESSION: 1. There persists a hazy density laterally in the right lung field. This
could be related to pleural thickening but to further exclude the
possibility of a hazy lung mass, additional evaluation of the chest by CT is
recommended.
2. The current exam shows minimal atelectasis or infiltrate at the left base.

## 2011-04-22 ENCOUNTER — Ambulatory Visit: Payer: Self-pay | Admitting: Internal Medicine

## 2011-05-18 ENCOUNTER — Ambulatory Visit: Payer: Self-pay | Admitting: Internal Medicine

## 2011-05-18 IMAGING — CR DG CHEST 2V
1 series · 2 of 2 positions shown · non-contrast
Comparison: none

REASON FOR EXAM: cough
COMMENTS:

PROCEDURE:     KDR - KDXR CHEST PA (OR AP) AND LAT  - [DATE]  [DATE]
RESULT:     The lungs are clear. The cardiac silhouette and visualized bony
skeleton are unremarkable.
The previous described hazy density within the right hemithorax has resolved
in the interim.

[Series 1: pa · 0.17mm/px · 2 of 2 slices shown]
[im 1/2]
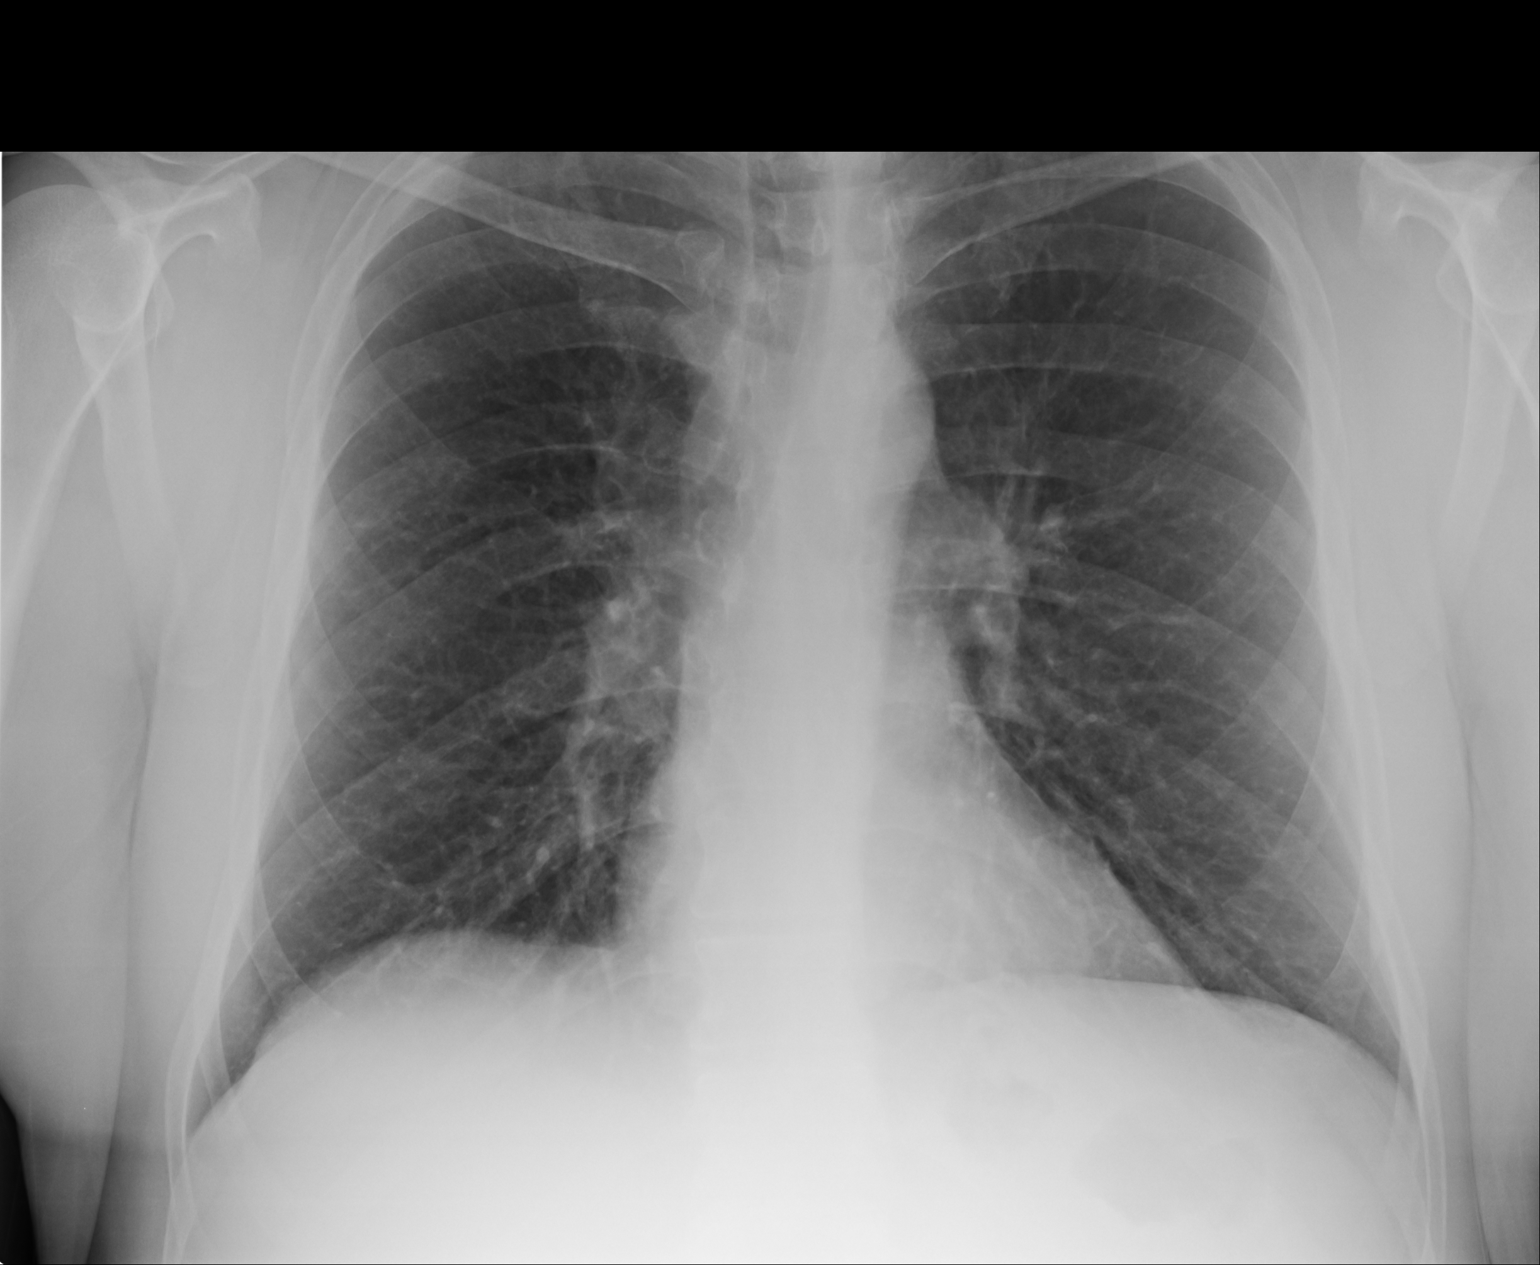
[im 2/2]
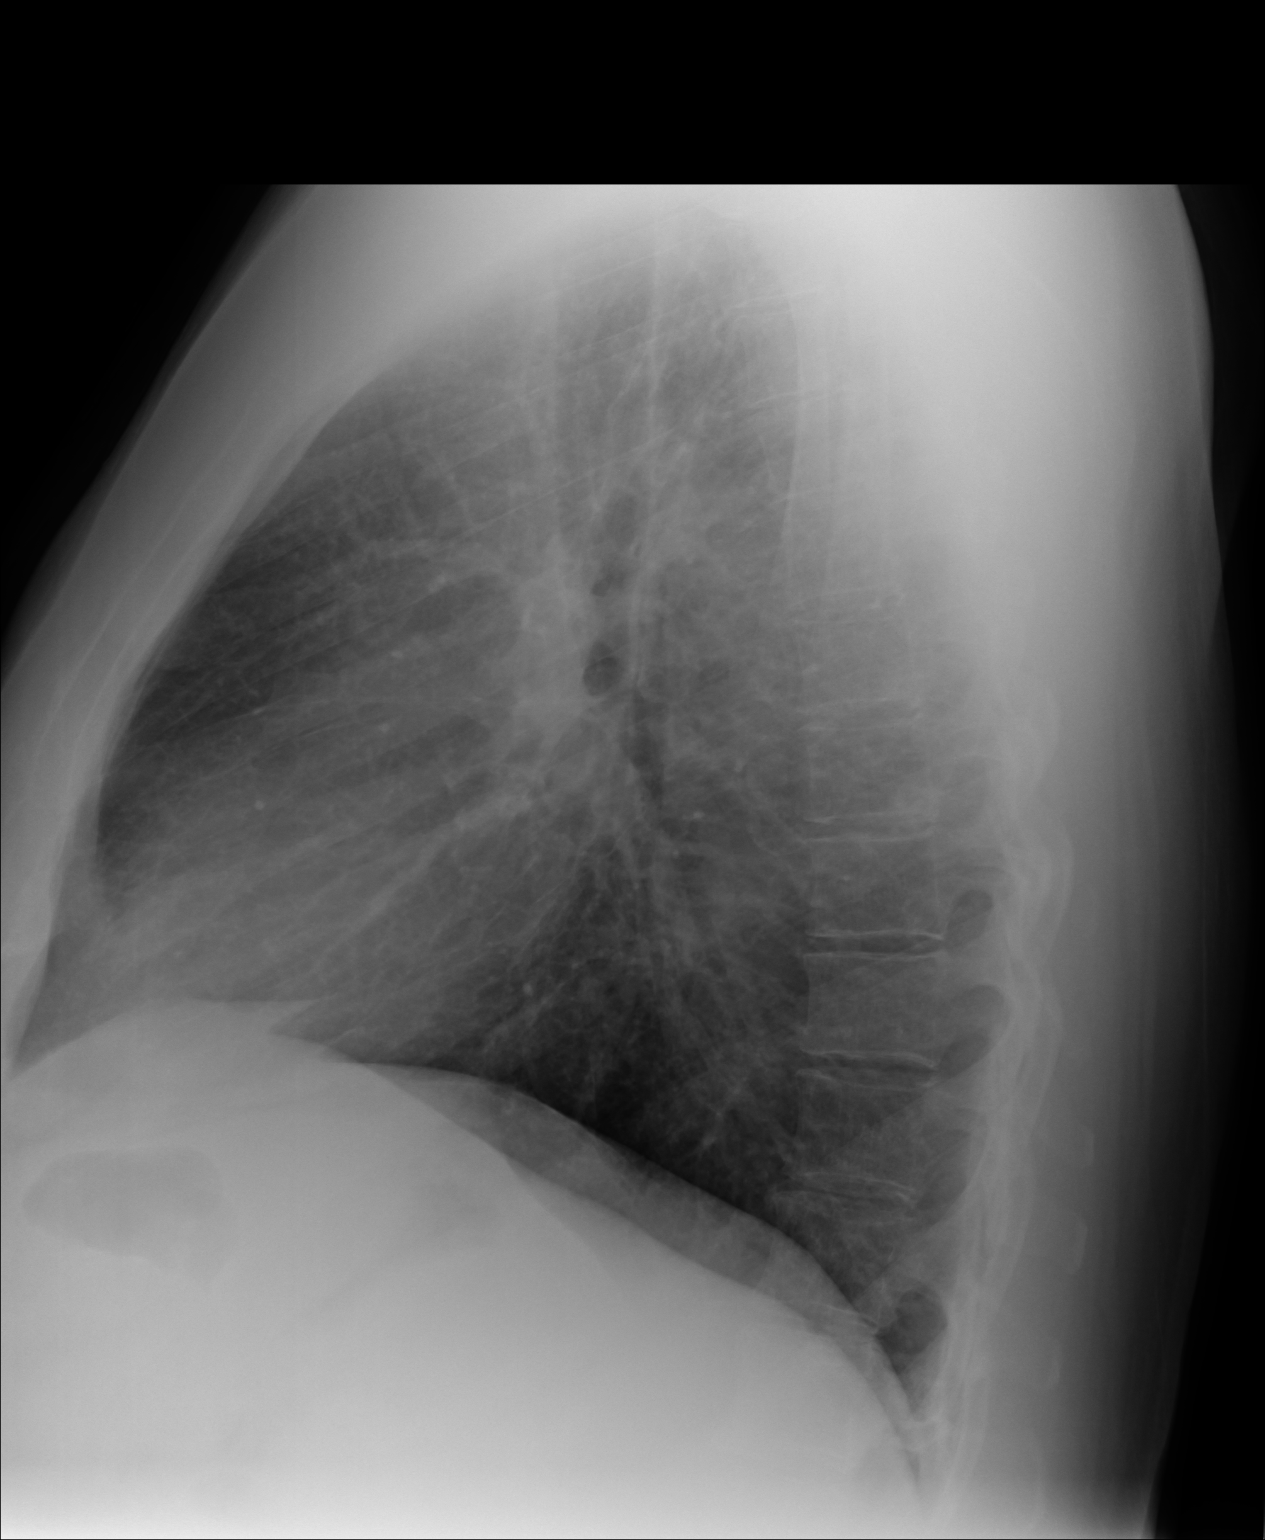

[2 of 2 positions shown; findings below may reference images not displayed]

IMPRESSION: 1. Chest radiograph without evidence of acute cardiopulmonary disease.
2. A comparison was made to prior study dated [DATE].

## 2011-05-27 LAB — CULTURE, FUNGUS WITHOUT SMEAR

## 2011-07-06 DIAGNOSIS — E669 Obesity, unspecified: Secondary | ICD-10-CM | POA: Insufficient documentation

## 2012-03-15 ENCOUNTER — Emergency Department: Payer: Self-pay | Admitting: Emergency Medicine

## 2012-03-15 LAB — URINALYSIS, COMPLETE
Bilirubin,UR: NEGATIVE
Blood: NEGATIVE
Leukocyte Esterase: NEGATIVE
Nitrite: NEGATIVE
Ph: 5 (ref 4.5–8.0)
Squamous Epithelial: <1

## 2012-03-15 LAB — COMPREHENSIVE METABOLIC PANEL
Albumin: 3.7 g/dL (ref 3.4–5.0)
Anion Gap: 8 (ref 7–16)
Bilirubin,Total: 0.3 mg/dL (ref 0.2–1.0)
Calcium, Total: 8.9 mg/dL (ref 8.5–10.1)
Co2: 24 mmol/L (ref 21–32)
Glucose: 173 mg/dL — ABNORMAL HIGH (ref 65–99)
Osmolality: 274 (ref 275–301)
Potassium: 3.8 mmol/L (ref 3.5–5.1)
Sodium: 134 mmol/L — ABNORMAL LOW (ref 136–145)

## 2012-03-15 LAB — CBC
MCHC: 33.1 g/dL (ref 32.0–36.0)
Platelet: 235 10*3/uL (ref 150–440)
RBC: 5.5 10*6/uL (ref 4.40–5.90)
WBC: 7.9 10*3/uL (ref 3.8–10.6)

## 2013-01-17 DIAGNOSIS — J4489 Other specified chronic obstructive pulmonary disease: Secondary | ICD-10-CM | POA: Insufficient documentation

## 2013-01-17 DIAGNOSIS — E1169 Type 2 diabetes mellitus with other specified complication: Secondary | ICD-10-CM | POA: Insufficient documentation

## 2013-01-17 DIAGNOSIS — G4730 Sleep apnea, unspecified: Secondary | ICD-10-CM | POA: Insufficient documentation

## 2013-01-17 DIAGNOSIS — E291 Testicular hypofunction: Secondary | ICD-10-CM | POA: Insufficient documentation

## 2013-01-17 DIAGNOSIS — E785 Hyperlipidemia, unspecified: Secondary | ICD-10-CM | POA: Insufficient documentation

## 2013-01-17 DIAGNOSIS — T7840XA Allergy, unspecified, initial encounter: Secondary | ICD-10-CM | POA: Insufficient documentation

## 2013-01-17 DIAGNOSIS — K219 Gastro-esophageal reflux disease without esophagitis: Secondary | ICD-10-CM | POA: Insufficient documentation

## 2013-01-17 DIAGNOSIS — J449 Chronic obstructive pulmonary disease, unspecified: Secondary | ICD-10-CM | POA: Insufficient documentation

## 2014-02-18 ENCOUNTER — Emergency Department: Payer: Self-pay | Admitting: Student

## 2014-02-18 LAB — CBC
HCT: 48.9 % (ref 40.0–52.0)
HGB: 16 g/dL (ref 13.0–18.0)
MCH: 27.3 pg (ref 26.0–34.0)
MCHC: 32.8 g/dL (ref 32.0–36.0)
MCV: 83 fL (ref 80–100)
PLATELETS: 241 10*3/uL (ref 150–440)
RBC: 5.88 10*6/uL (ref 4.40–5.90)
RDW: 15.3 % — AB (ref 11.5–14.5)
WBC: 9 10*3/uL (ref 3.8–10.6)

## 2014-02-18 LAB — BASIC METABOLIC PANEL
ANION GAP: 9 (ref 7–16)
BUN: 11 mg/dL (ref 7–18)
CALCIUM: 8 mg/dL — AB (ref 8.5–10.1)
CHLORIDE: 104 mmol/L (ref 98–107)
CREATININE: 1.09 mg/dL (ref 0.60–1.30)
Co2: 25 mmol/L (ref 21–32)
EGFR (African American): >60
EGFR (Non-African Amer.): >60
GLUCOSE: 139 mg/dL — AB (ref 65–99)
Osmolality: 277 (ref 275–301)
Potassium: 4.2 mmol/L (ref 3.5–5.1)
Sodium: 138 mmol/L (ref 136–145)

## 2014-02-18 LAB — TROPONIN I: Troponin-I: <0.02 ng/mL

## 2014-02-18 IMAGING — CR DG CHEST 2V
1 series · 2 of 2 positions shown · non-contrast
Comparison: [DATE]

CLINICAL DATA: Upper left chest pain, weakness, dizziness, history
of pneumonia and diabetes

EXAM:
CHEST - 2 VIEW

[Series 1: w chest pa · 0.14mm/px · 2 of 2 slices shown]
[im 1/2]
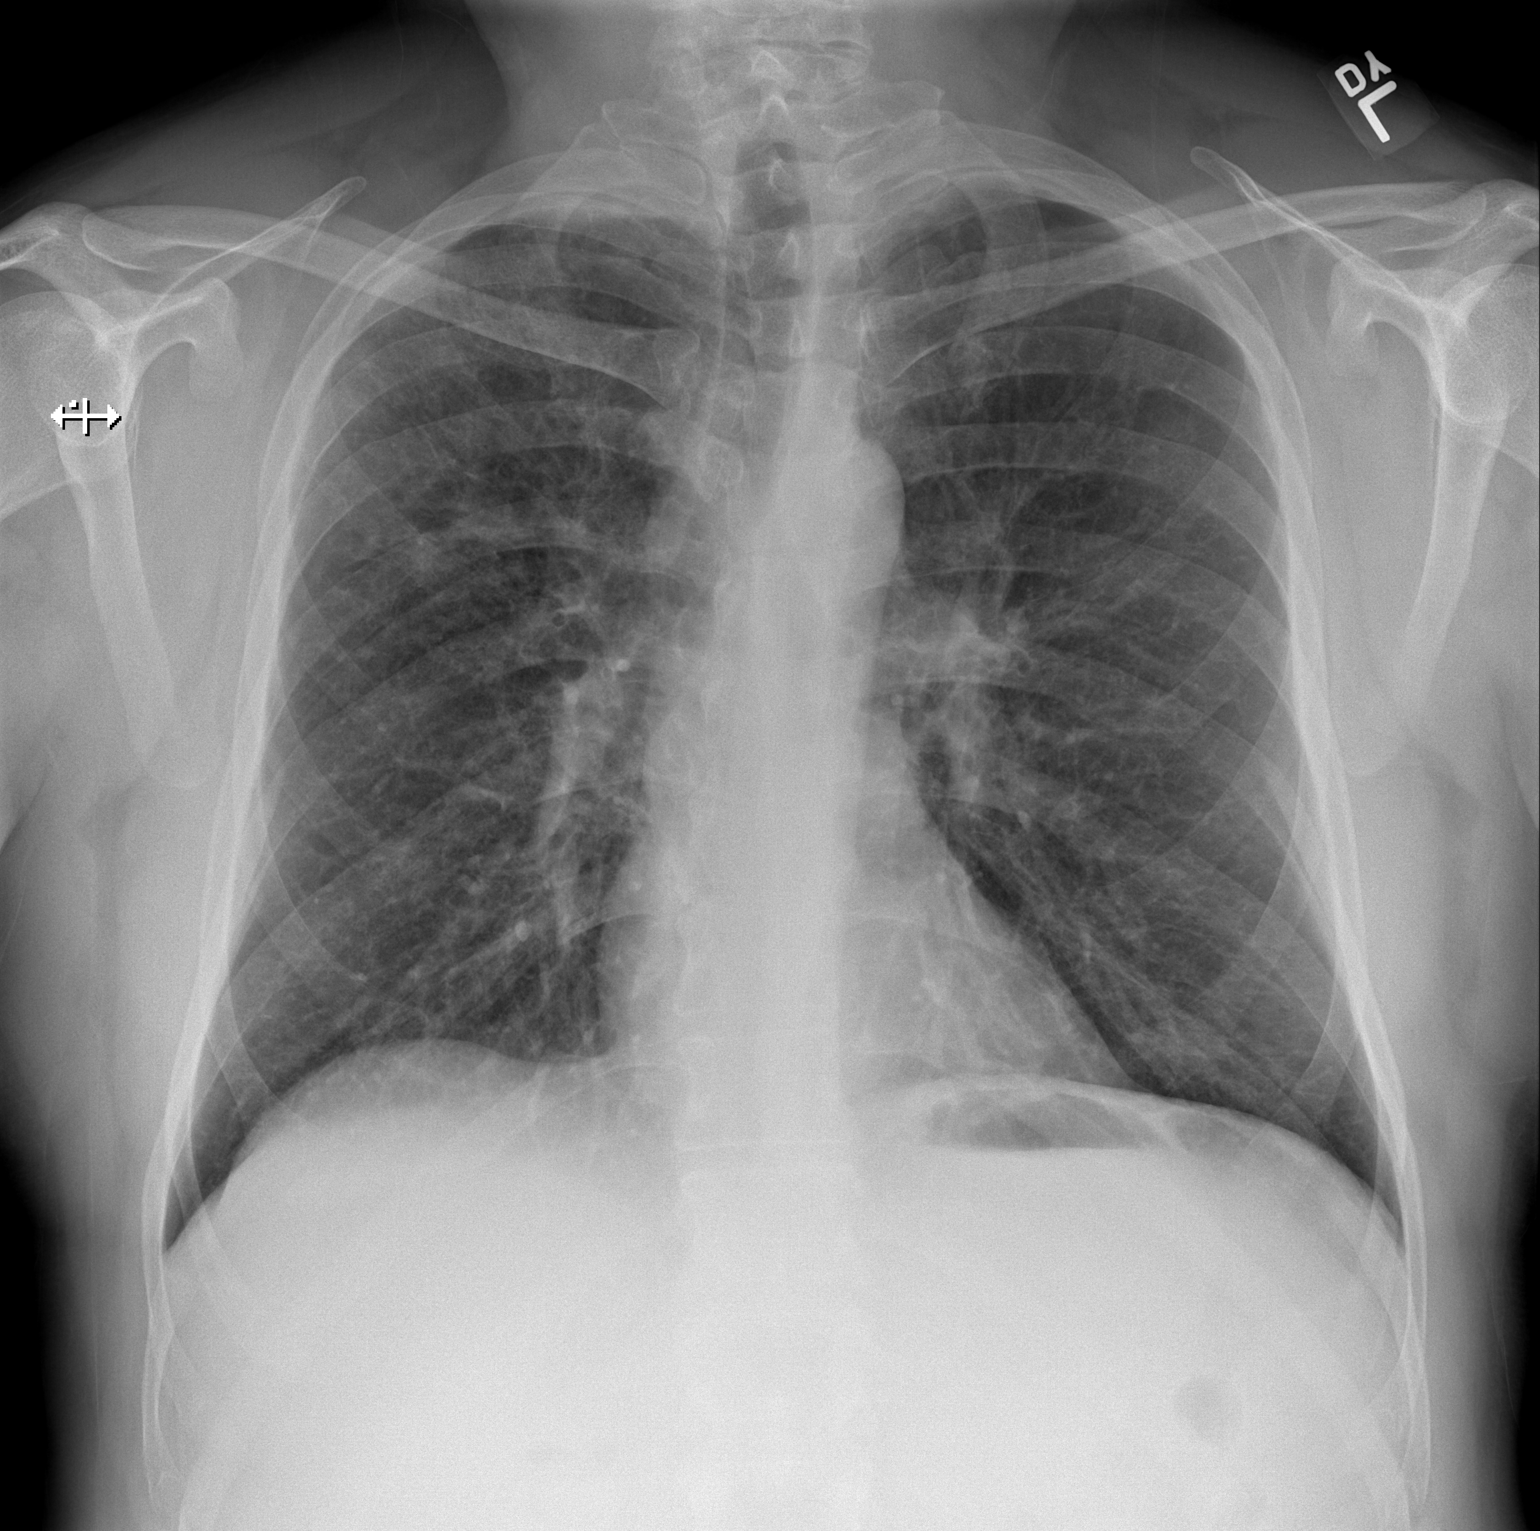
[im 2/2]
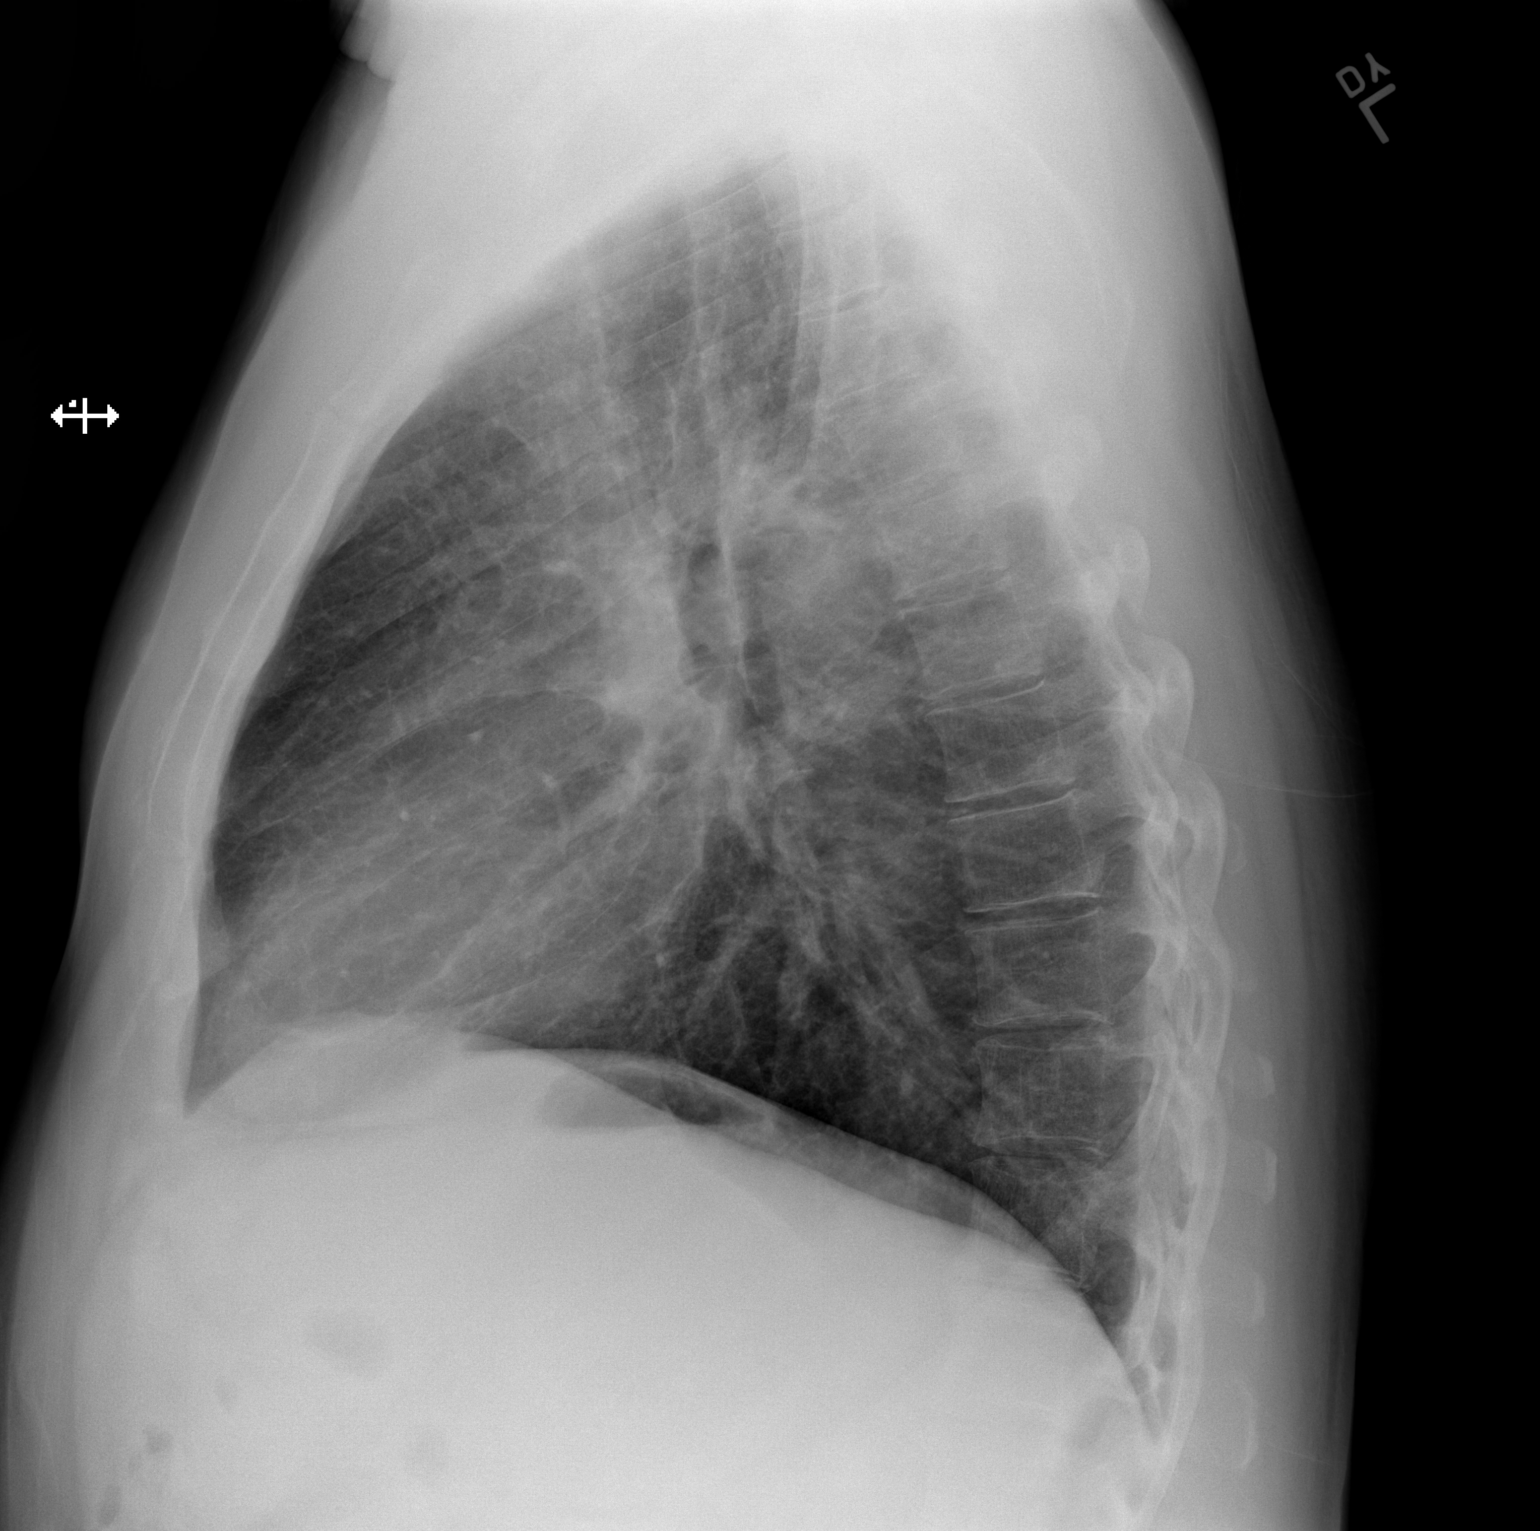

[2 of 2 positions shown; findings below may reference images not displayed]

FINDINGS: Ill-defined streaky bronchovascular opacity in the right upper lobe
and right hilum compatible with mild bronchopneumonia. Left lung
remains clear. Normal heart size and vascularity. No edema, effusion
or pneumothorax. Trachea is midline. Mild scoliosis of the spine.
IMPRESSION: Findings compatible with mild streaky right upper lobe
bronchopneumonia. Recommend radiographic follow-up after treatment
to document resolution.

## 2014-02-19 ENCOUNTER — Emergency Department: Payer: Self-pay | Admitting: Emergency Medicine

## 2014-02-19 LAB — CBC
HCT: 49 % (ref 40.0–52.0)
HGB: 15.7 g/dL (ref 13.0–18.0)
MCH: 26.7 pg (ref 26.0–34.0)
MCHC: 31.9 g/dL — AB (ref 32.0–36.0)
MCV: 84 fL (ref 80–100)
Platelet: 243 10*3/uL (ref 150–440)
RBC: 5.86 10*6/uL (ref 4.40–5.90)
RDW: 15.3 % — ABNORMAL HIGH (ref 11.5–14.5)
WBC: 8.5 10*3/uL (ref 3.8–10.6)

## 2014-02-19 LAB — BASIC METABOLIC PANEL
Anion Gap: 8 (ref 7–16)
BUN: 12 mg/dL (ref 7–18)
CHLORIDE: 102 mmol/L (ref 98–107)
CO2: 27 mmol/L (ref 21–32)
Calcium, Total: 8.7 mg/dL (ref 8.5–10.1)
Creatinine: 1 mg/dL (ref 0.60–1.30)
EGFR (Non-African Amer.): >60
GLUCOSE: 139 mg/dL — AB (ref 65–99)
Osmolality: 276 (ref 275–301)
Potassium: 4.3 mmol/L (ref 3.5–5.1)
Sodium: 137 mmol/L (ref 136–145)

## 2014-02-19 LAB — TROPONIN I: Troponin-I: <0.02 ng/mL

## 2014-03-13 ENCOUNTER — Emergency Department: Payer: Self-pay | Admitting: Emergency Medicine

## 2014-03-13 LAB — BASIC METABOLIC PANEL
ANION GAP: 6 — AB (ref 7–16)
BUN: 21 mg/dL — ABNORMAL HIGH (ref 7–18)
CHLORIDE: 103 mmol/L (ref 98–107)
Calcium, Total: 9.1 mg/dL (ref 8.5–10.1)
Co2: 26 mmol/L (ref 21–32)
Creatinine: 1.08 mg/dL (ref 0.60–1.30)
Glucose: 285 mg/dL — ABNORMAL HIGH (ref 65–99)
Osmolality: 283 (ref 275–301)
Potassium: 4.4 mmol/L (ref 3.5–5.1)
SODIUM: 135 mmol/L — AB (ref 136–145)

## 2014-03-13 LAB — CBC
HCT: 48.9 % (ref 40.0–52.0)
HGB: 16.1 g/dL (ref 13.0–18.0)
MCH: 27.2 pg (ref 26.0–34.0)
MCHC: 32.9 g/dL (ref 32.0–36.0)
MCV: 83 fL (ref 80–100)
Platelet: 185 10*3/uL (ref 150–440)
RBC: 5.92 10*6/uL — ABNORMAL HIGH (ref 4.40–5.90)
RDW: 15.2 % — AB (ref 11.5–14.5)
WBC: 8.7 10*3/uL (ref 3.8–10.6)

## 2014-03-13 LAB — TROPONIN I

## 2014-03-13 IMAGING — CR DG CHEST 2V
1 series · 2 of 2 positions shown · non-contrast
Comparison: [DATE]

CLINICAL DATA: Left-sided chest pain which began 3 weeks ago.

EXAM:
CHEST  2 VIEW

[Series 4: w chest pa · 0.14mm/px · 2 of 2 slices shown]
[im 1/2]
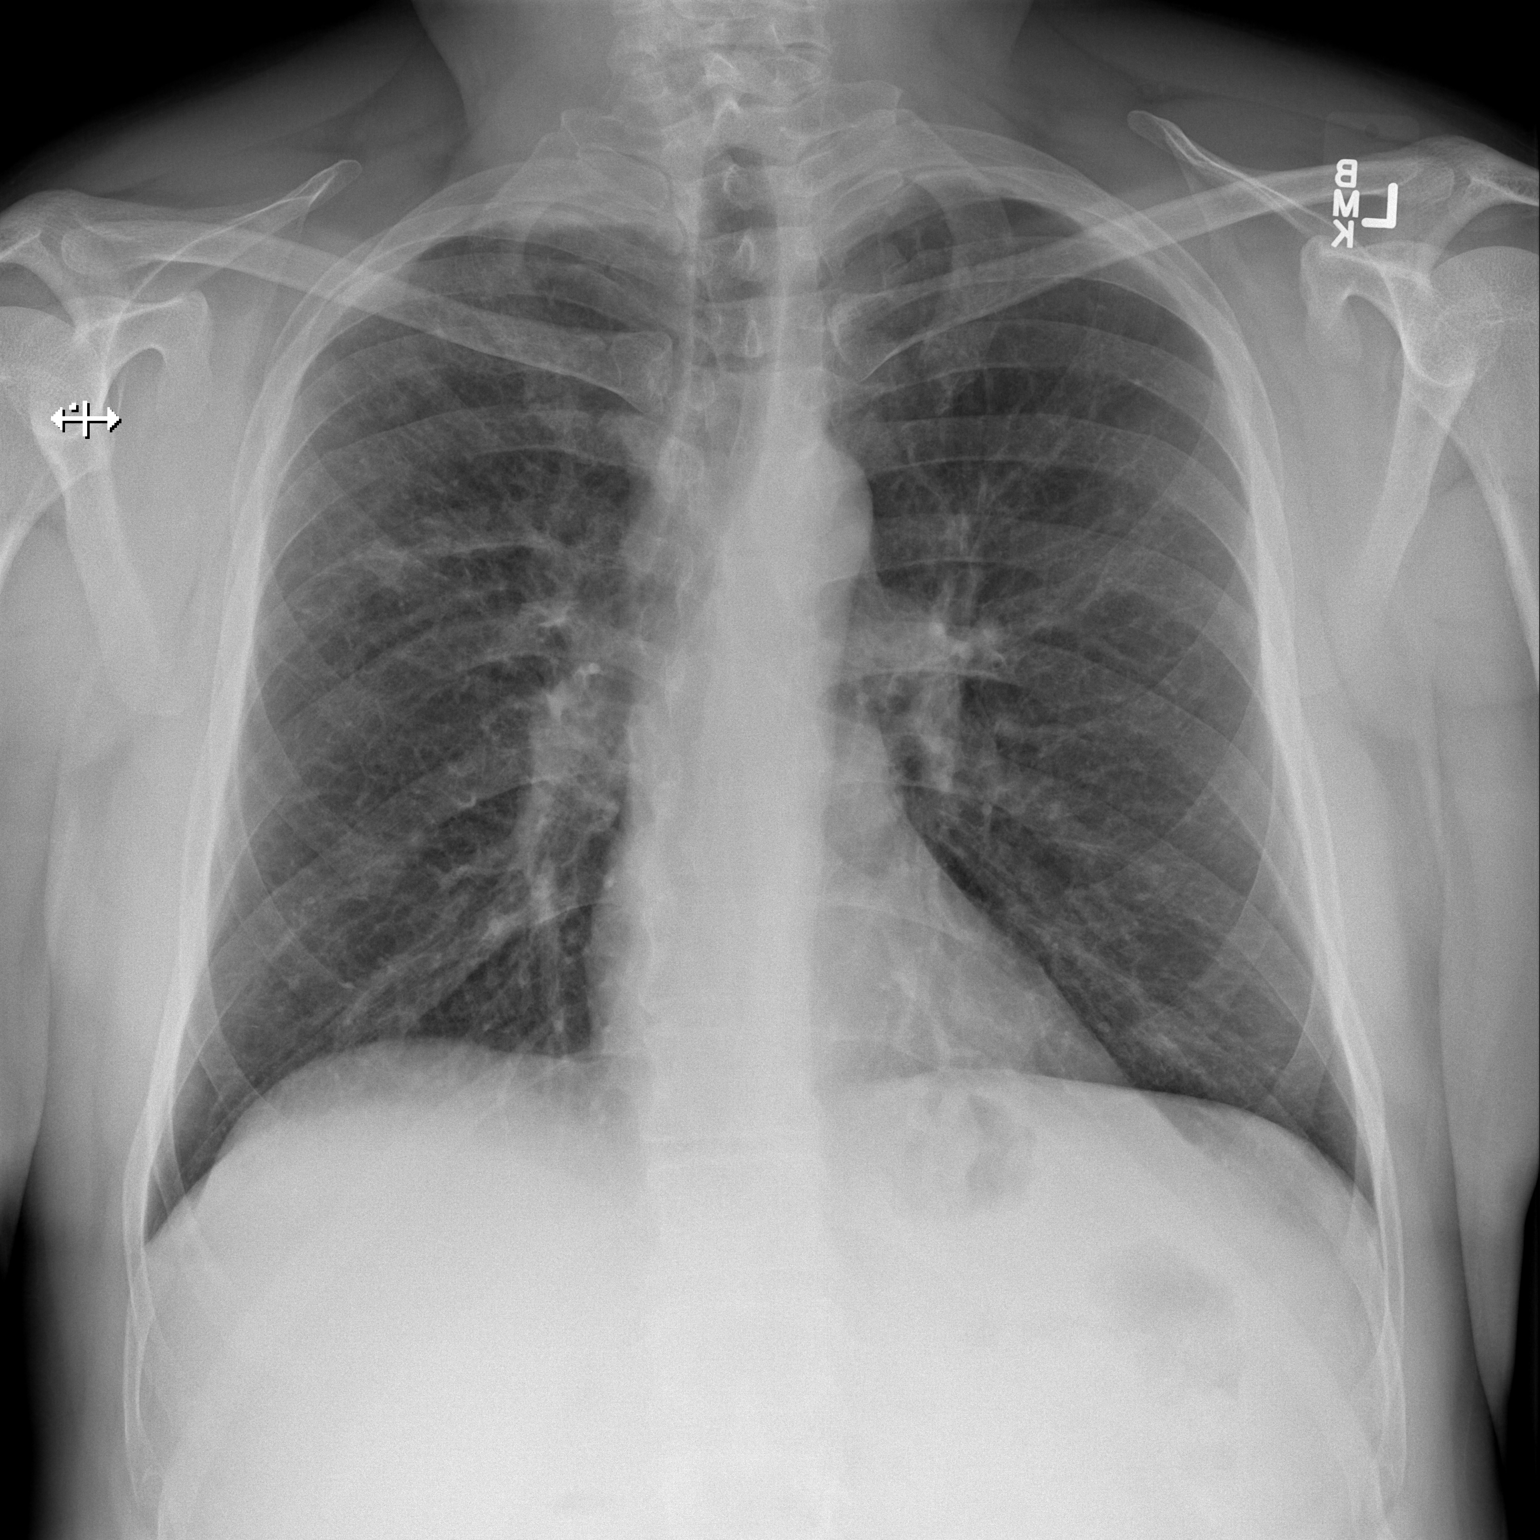
[im 2/2]
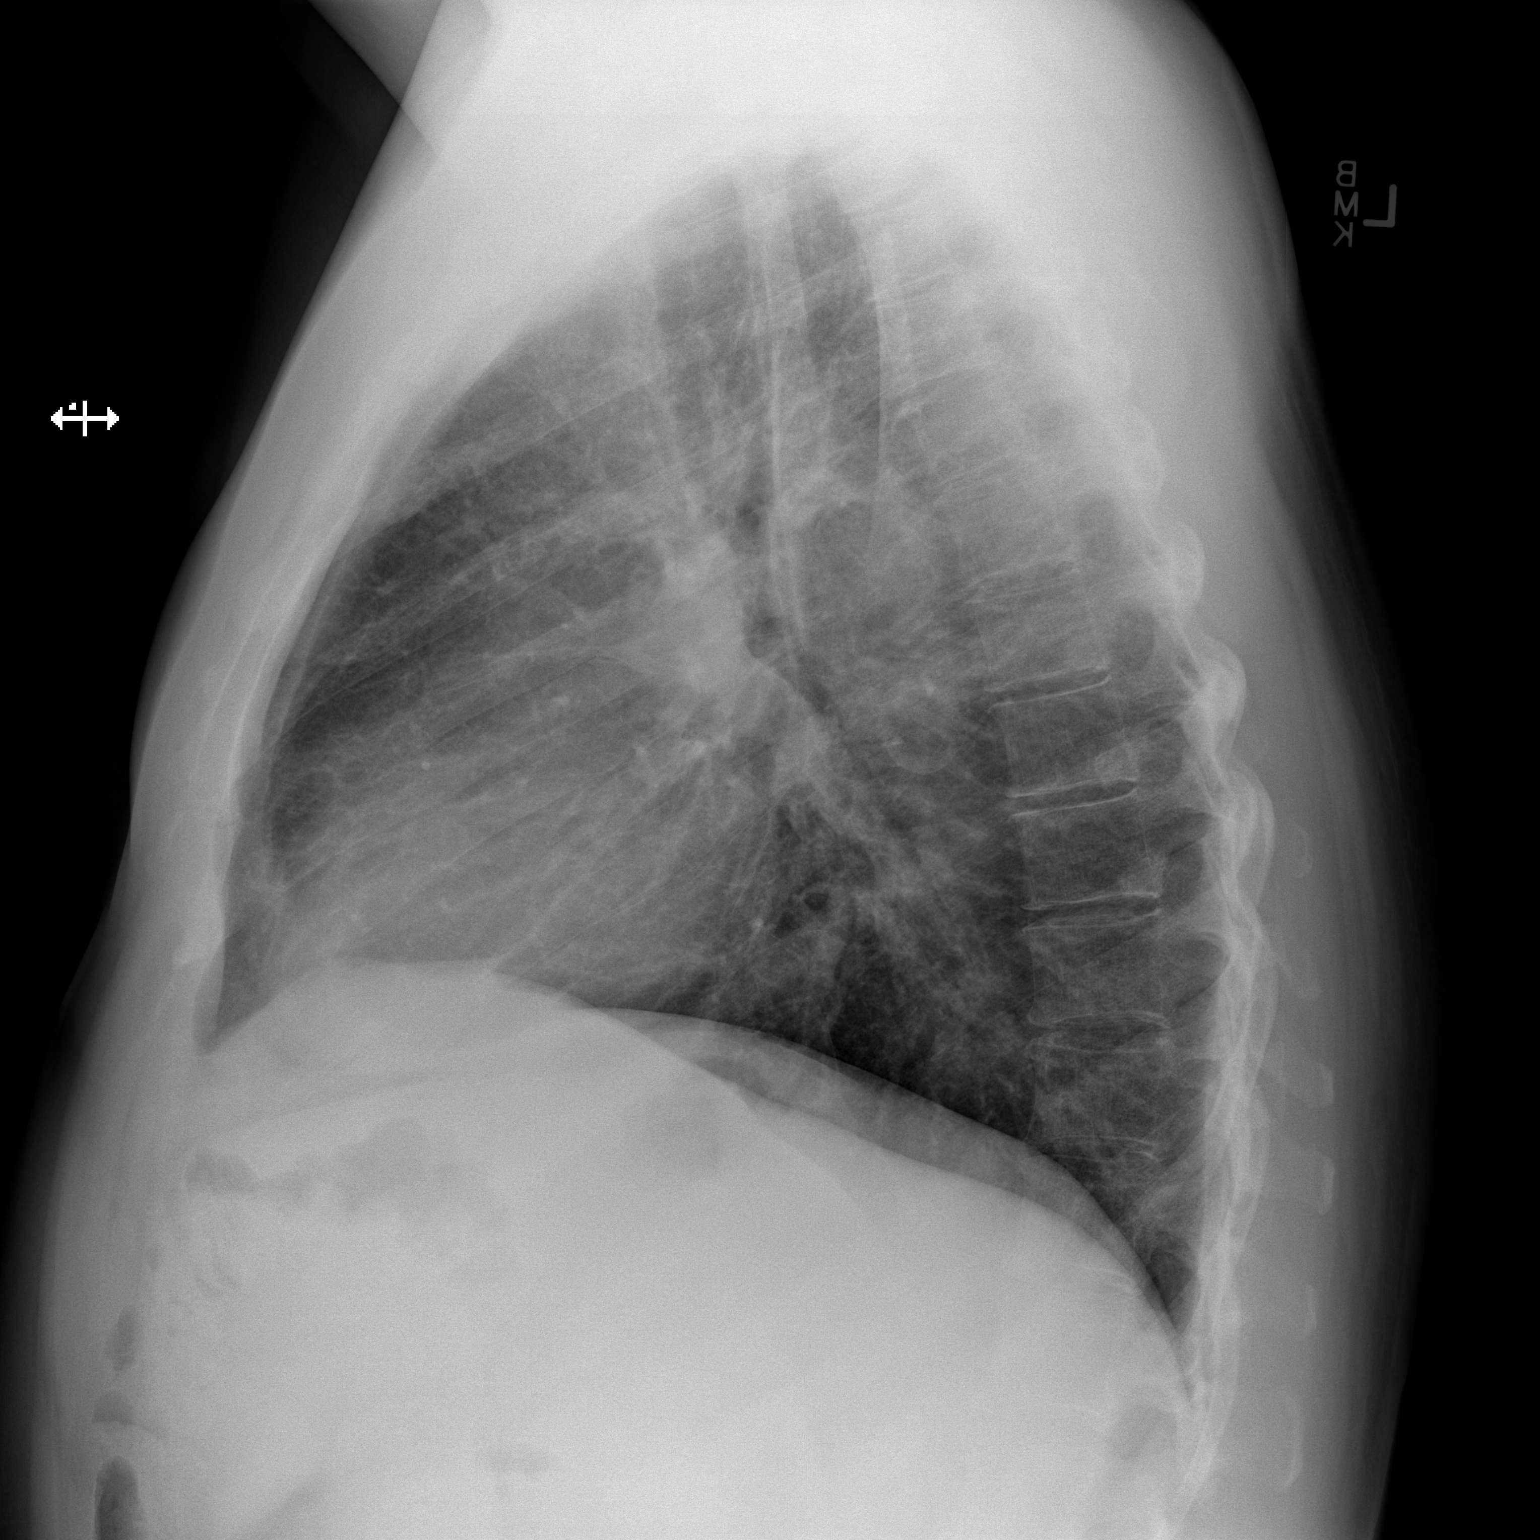

[2 of 2 positions shown; findings below may reference images not displayed]

FINDINGS: Normal heart size. There is no pleural effusion or edema. Chronic
interstitial coarsening is again noted. Persistent streaky opacities
in the right upper lobe compatible with broncho pneumonia. Left lung
is relatively clear. The visualized osseous structures are
unremarkable.
IMPRESSION: 1. Persistent right upper lobe bronchopneumonia.

## 2014-03-14 LAB — D-DIMER(ARMC): D-DIMER: 191 ng/mL

## 2014-03-14 LAB — TROPONIN I

## 2014-03-14 IMAGING — CT CT HEAD WITHOUT CONTRAST
1 series · 16 of 30 positions shown, 20 images · non-contrast
Comparison: CT of the head [DATE]

CLINICAL DATA: Sharp LEFT-sided chest pain beginning 3 weeks ago,
diagnosed with pneumonia. Continued pain, now radiating to LEFT arm.

EXAM:
CT HEAD WITHOUT CONTRAST
TECHNIQUE: Contiguous axial images were obtained from the base of the skull
through the vertex without intravenous contrast.

[Series 2: head wo · axial · 0.41mm/px · z∈[-23,+121]mm · 16 of 36 slices shown, 20 images]
[im 2/36  brain]
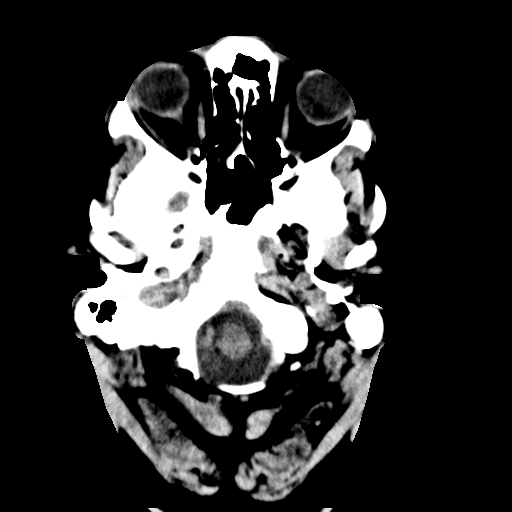
[im 2/36  bone]
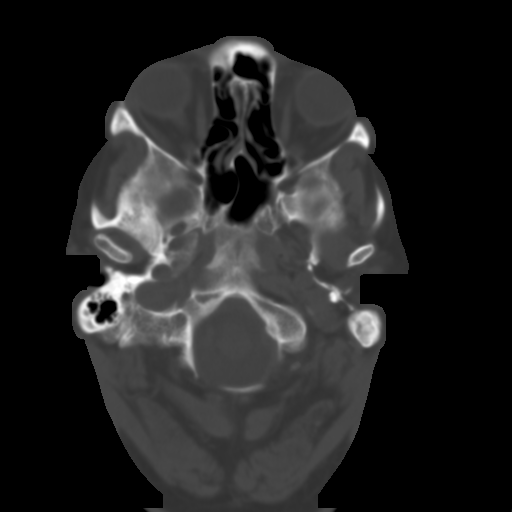
[im 4/36  brain]
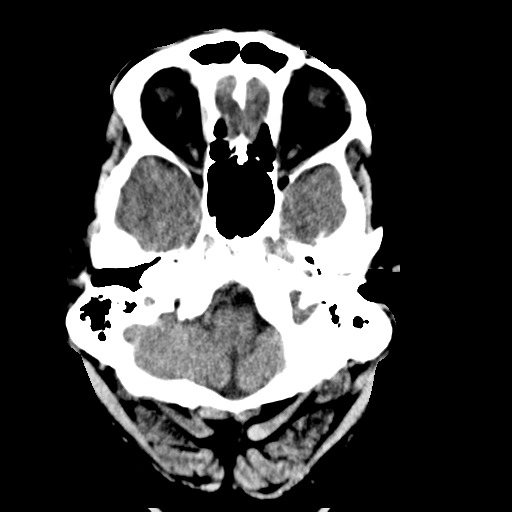
[im 7/36  brain]
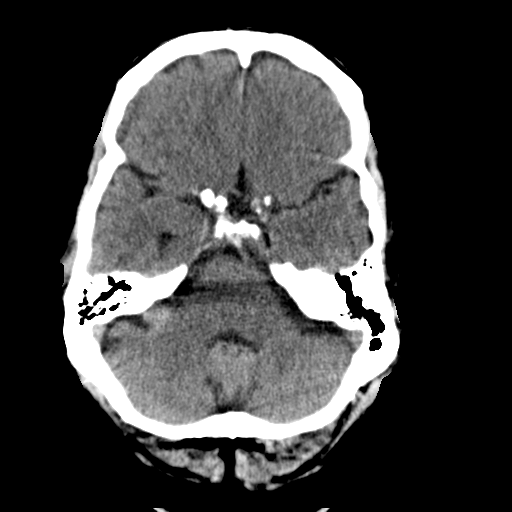
[im 9/36  brain]
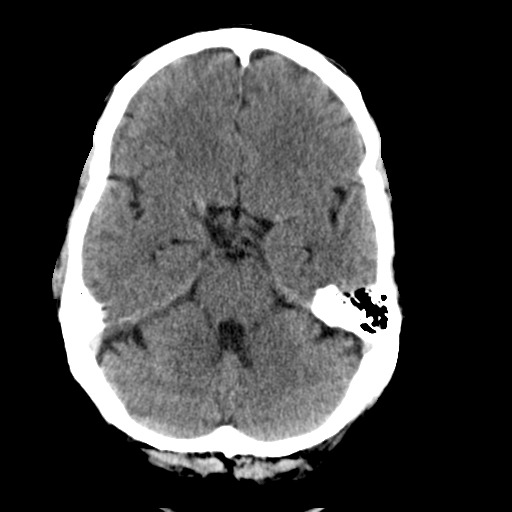
[im 10/36  brain]
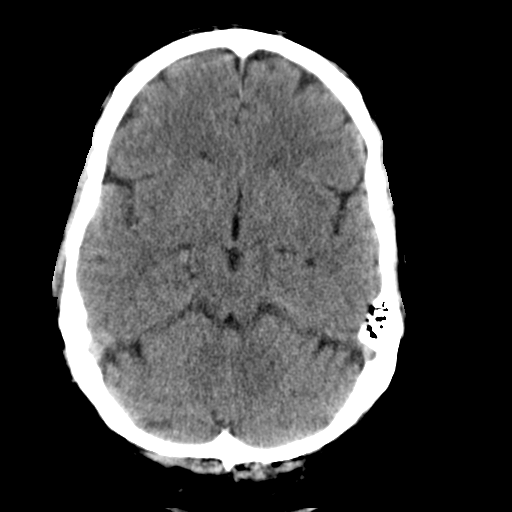
[im 10/36  bone]
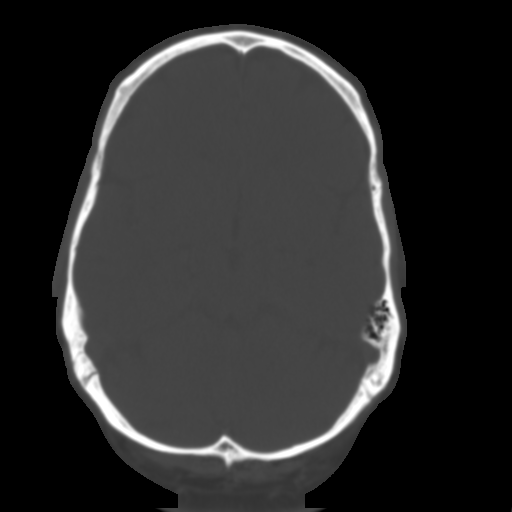
[im 13/36  brain]
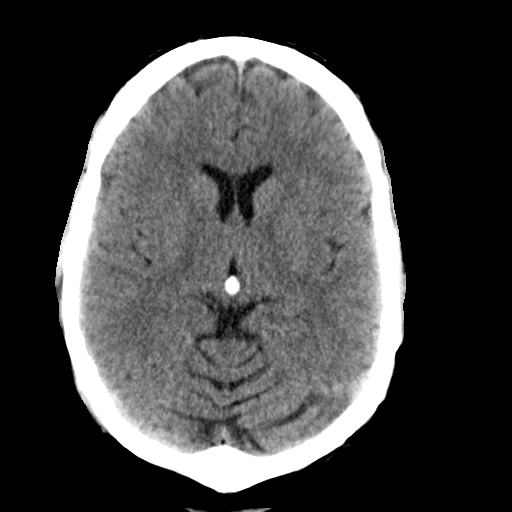
[im 15/36  brain]
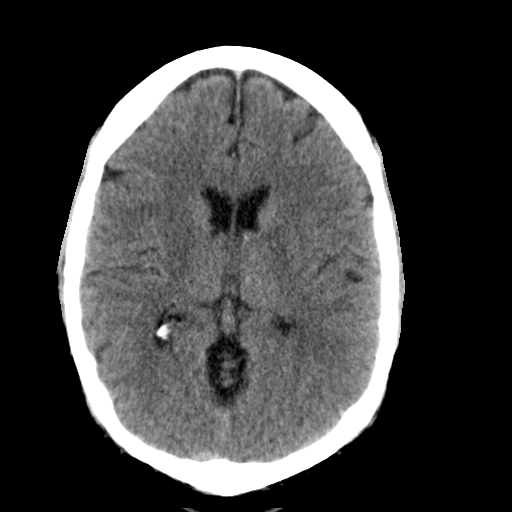
[im 17/36  brain]
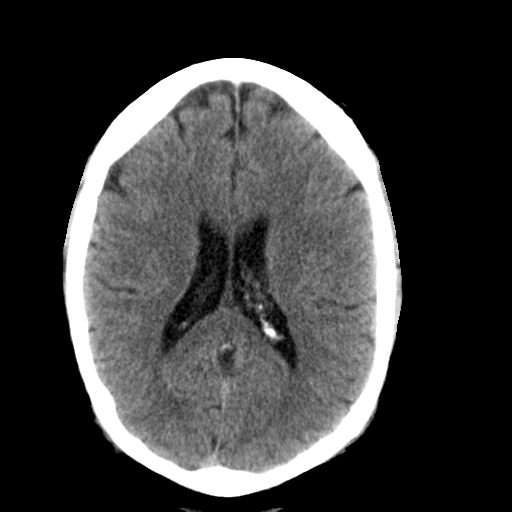
[im 19/36  brain]
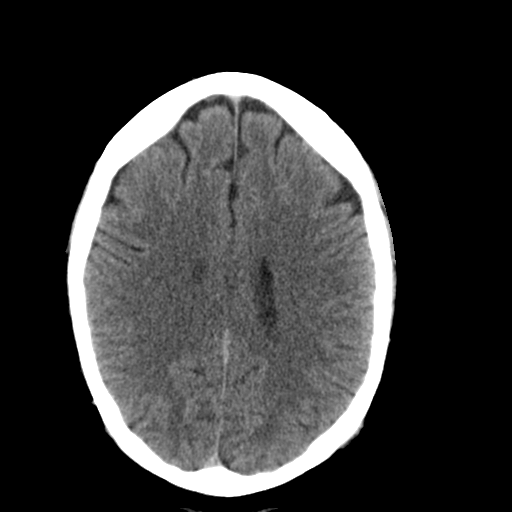
[im 19/36  bone]
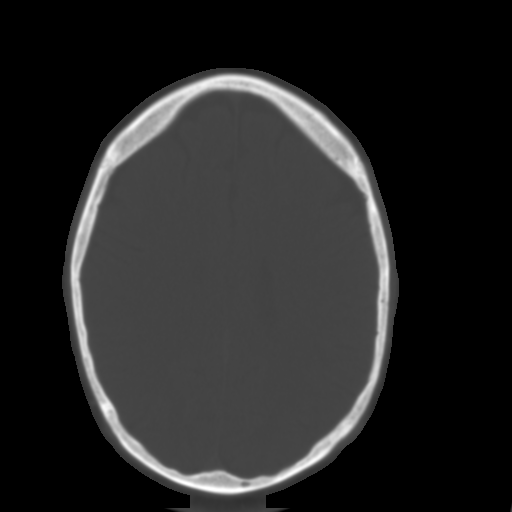
[im 21/36  brain]
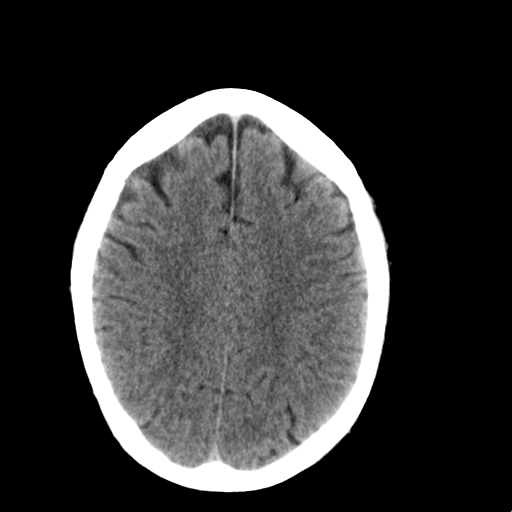
[im 23/36  brain]
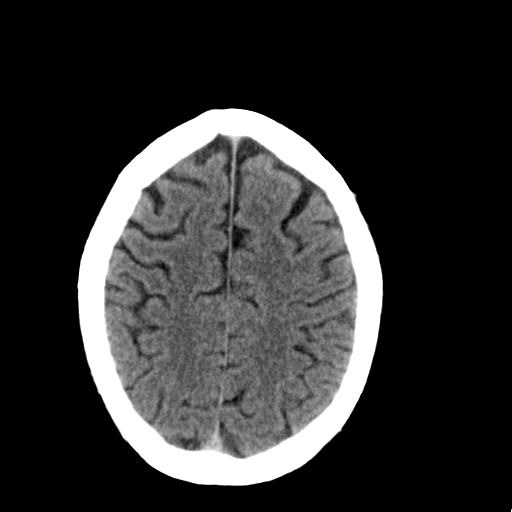
[im 26/36  brain]
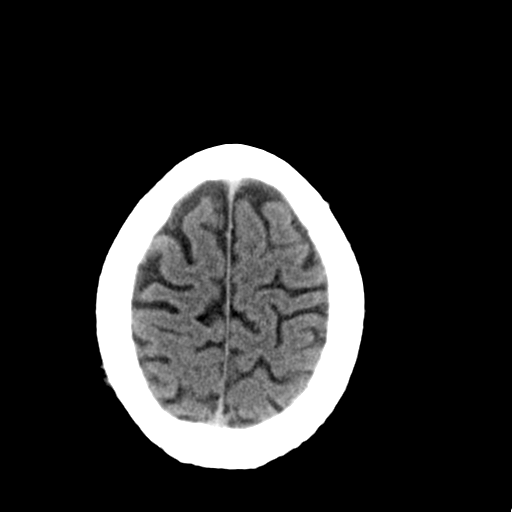
[im 27/36  brain]
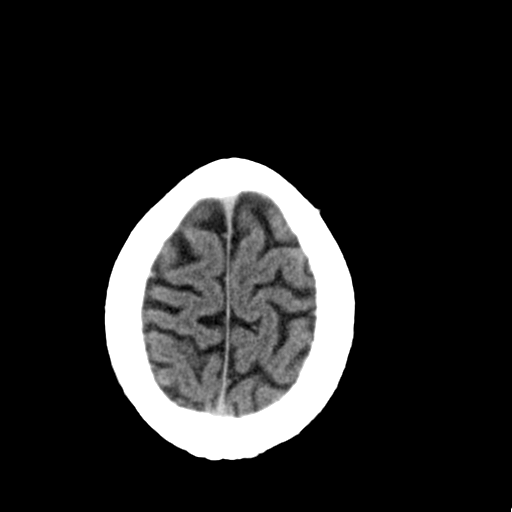
[im 27/36  bone]
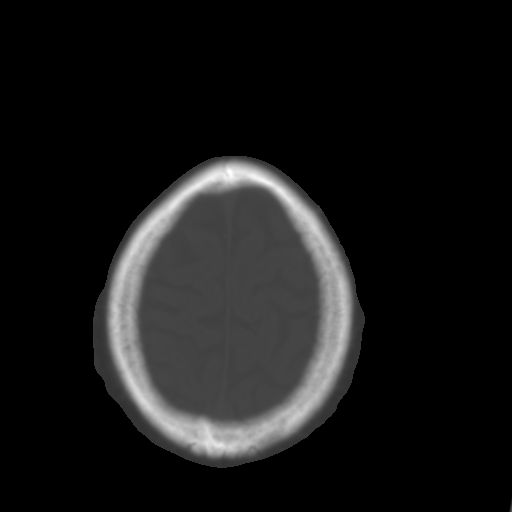
[im 29/36  brain]
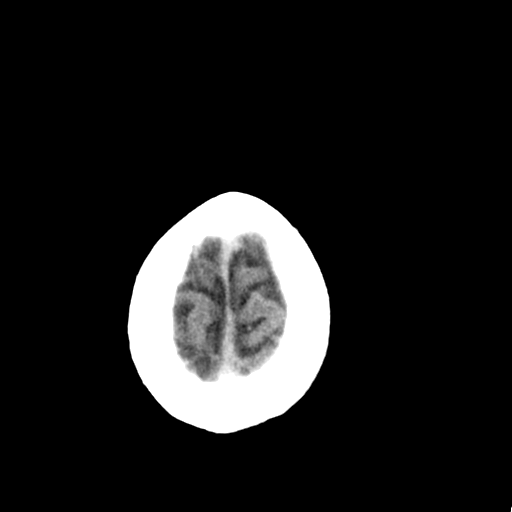
[im 32/36  brain]
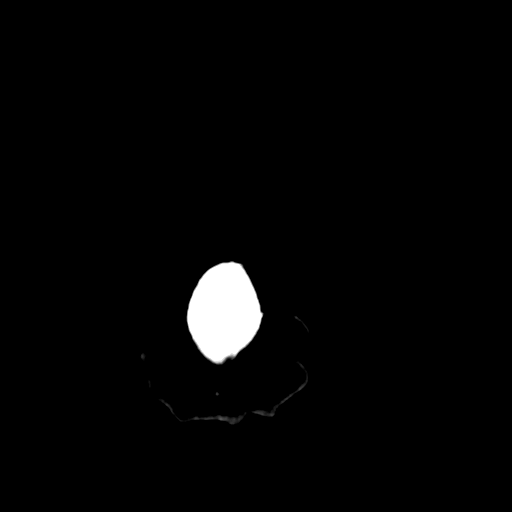
[im 34/36  brain]
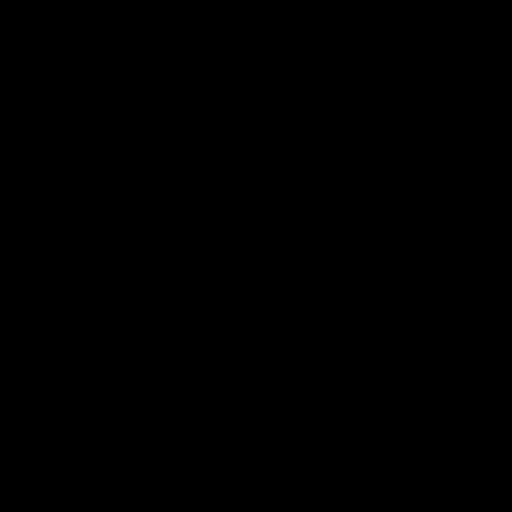

[16 of 30 positions shown; findings below may reference images not displayed]

FINDINGS: The ventricles and sulci are normal. No intraparenchymal hemorrhage,
mass effect nor midline shift. No acute large vascular territory
infarcts. Cerebellar tonsils at but not below the foramen magnum.

No abnormal extra-axial fluid collections. Basal cisterns are
patent. Mild calcific atherosclerosis of the carotid siphons.

No skull fracture. The included ocular globes and orbital contents
are non-suspicious. Trace paranasal sinus mucosal thickening,
partially imaged without visualized paranasal sinus air-fluid
levels. Mastoid air cells are well aerated.
IMPRESSION: No acute intracranial process ; specifically no evidence of acute
ischemia.

Mild atherosclerosis of the carotid siphons.

  By: MINAYA

## 2014-03-16 HISTORY — PX: BRONCHOSCOPY: SUR163

## 2014-08-15 ENCOUNTER — Encounter: Payer: Self-pay | Admitting: Emergency Medicine

## 2014-08-15 ENCOUNTER — Emergency Department: Payer: Managed Care, Other (non HMO)

## 2014-08-15 ENCOUNTER — Emergency Department
Admission: EM | Admit: 2014-08-15 | Discharge: 2014-08-15 | Disposition: A | Discharge status: Home / Self Care | Payer: Managed Care, Other (non HMO) | Attending: Emergency Medicine | Admitting: Emergency Medicine

## 2014-08-15 ENCOUNTER — Other Ambulatory Visit: Payer: Self-pay

## 2014-08-15 DIAGNOSIS — Z791 Long term (current) use of non-steroidal anti-inflammatories (NSAID): Secondary | ICD-10-CM | POA: Insufficient documentation

## 2014-08-15 DIAGNOSIS — Z79899 Other long term (current) drug therapy: Secondary | ICD-10-CM | POA: Insufficient documentation

## 2014-08-15 DIAGNOSIS — R109 Unspecified abdominal pain: Secondary | ICD-10-CM | POA: Diagnosis present

## 2014-08-15 DIAGNOSIS — Z7951 Long term (current) use of inhaled steroids: Secondary | ICD-10-CM | POA: Diagnosis not present

## 2014-08-15 DIAGNOSIS — J441 Chronic obstructive pulmonary disease with (acute) exacerbation: Secondary | ICD-10-CM | POA: Diagnosis not present

## 2014-08-15 DIAGNOSIS — S301XXA Contusion of abdominal wall, initial encounter: Secondary | ICD-10-CM

## 2014-08-15 DIAGNOSIS — M7981 Nontraumatic hematoma of soft tissue: Secondary | ICD-10-CM | POA: Insufficient documentation

## 2014-08-15 DIAGNOSIS — Z720 Tobacco use: Secondary | ICD-10-CM | POA: Insufficient documentation

## 2014-08-15 DIAGNOSIS — E119 Type 2 diabetes mellitus without complications: Secondary | ICD-10-CM | POA: Diagnosis not present

## 2014-08-15 HISTORY — DX: Type 2 diabetes mellitus without complications: E11.9

## 2014-08-15 HISTORY — DX: Chronic obstructive pulmonary disease, unspecified: J44.9

## 2014-08-15 LAB — COMPREHENSIVE METABOLIC PANEL
ALT: 23 U/L (ref 17–63)
ANION GAP: 7 (ref 5–15)
AST: 18 U/L (ref 15–41)
Albumin: 4.1 g/dL (ref 3.5–5.0)
Alkaline Phosphatase: 65 U/L (ref 38–126)
BILIRUBIN TOTAL: 0.4 mg/dL (ref 0.3–1.2)
BUN: 19 mg/dL (ref 6–20)
CHLORIDE: 103 mmol/L (ref 101–111)
CO2: 27 mmol/L (ref 22–32)
CREATININE: 0.96 mg/dL (ref 0.61–1.24)
Calcium: 9.3 mg/dL (ref 8.9–10.3)
GFR calc Af Amer: >60 mL/min (ref >60)
Glucose, Bld: 166 mg/dL — ABNORMAL HIGH (ref 65–99)
Potassium: 3.9 mmol/L (ref 3.5–5.1)
Sodium: 137 mmol/L (ref 135–145)
Total Protein: 7.3 g/dL (ref 6.5–8.1)

## 2014-08-15 LAB — CBC WITH DIFFERENTIAL/PLATELET
BASOS ABS: 0 10*3/uL (ref 0–0.1)
Basophils Relative: 1 %
EOS ABS: 0.1 10*3/uL (ref 0–0.7)
EOS PCT: 2 %
HEMATOCRIT: 45.3 % (ref 40.0–52.0)
HEMOGLOBIN: 14.5 g/dL (ref 13.0–18.0)
LYMPHS PCT: 20 %
Lymphs Abs: 1.4 10*3/uL (ref 1.0–3.6)
MCH: 26.1 pg (ref 26.0–34.0)
MCHC: 32 g/dL (ref 32.0–36.0)
MCV: 81.5 fL (ref 80.0–100.0)
MONO ABS: 0.7 10*3/uL (ref 0.2–1.0)
MONOS PCT: 10 %
NEUTROS PCT: 67 %
Neutro Abs: 4.6 10*3/uL (ref 1.4–6.5)
PLATELETS: 212 10*3/uL (ref 150–440)
RBC: 5.56 MIL/uL (ref 4.40–5.90)
RDW: 15.6 % — ABNORMAL HIGH (ref 11.5–14.5)
WBC: 6.8 10*3/uL (ref 3.8–10.6)

## 2014-08-15 LAB — URINALYSIS COMPLETE WITH MICROSCOPIC (ARMC ONLY)
BACTERIA UA: NONE SEEN
BILIRUBIN URINE: NEGATIVE
GLUCOSE, UA: NEGATIVE mg/dL
Hgb urine dipstick: NEGATIVE
Ketones, ur: NEGATIVE mg/dL
LEUKOCYTES UA: NEGATIVE
Nitrite: NEGATIVE
Protein, ur: NEGATIVE mg/dL
SQUAMOUS EPITHELIAL / LPF: NONE SEEN
Specific Gravity, Urine: 1.025 (ref 1.005–1.030)
pH: 6 (ref 5.0–8.0)

## 2014-08-15 LAB — PROTIME-INR
INR: 0.94
PROTHROMBIN TIME: 12.8 s (ref 11.4–15.0)

## 2014-08-15 IMAGING — CT CT ABD-PELV W/ CM
2 of 5 series · 12 of 46 positions shown, 14 images · non-contrast
Comparison: None.

CLINICAL DATA: 49-year-old male with 2 month history of cough and 3
day history of left lower chest and upper abdominal pain after
something 'popped' during a coughing episode.

EXAM:
CT CHEST, ABDOMEN AND PELVIS WITHOUT CONTRAST
TECHNIQUE: Multidetector CT imaging of the chest, abdomen and pelvis was
performed following the standard protocol without IV contrast.

[Series 2: cap with · axial · 0.80mm/px · z∈[+394,+1024]mm · 9 of 142 slices shown, 11 images]
[im 8/142  soft-tissue]
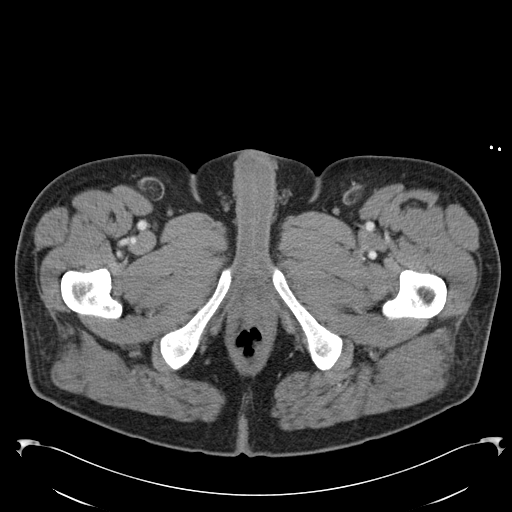
[im 8/142  bone]
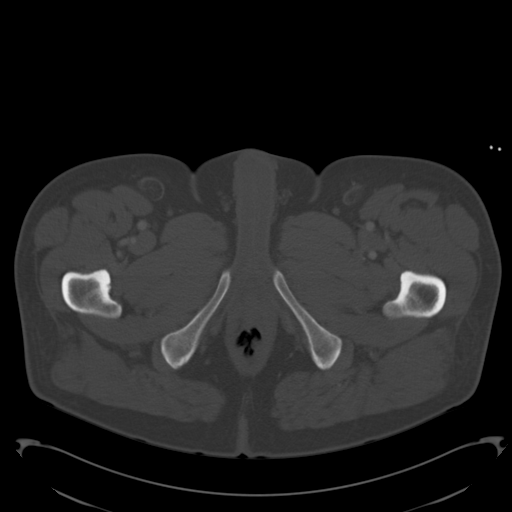
[im 24/142  soft-tissue]
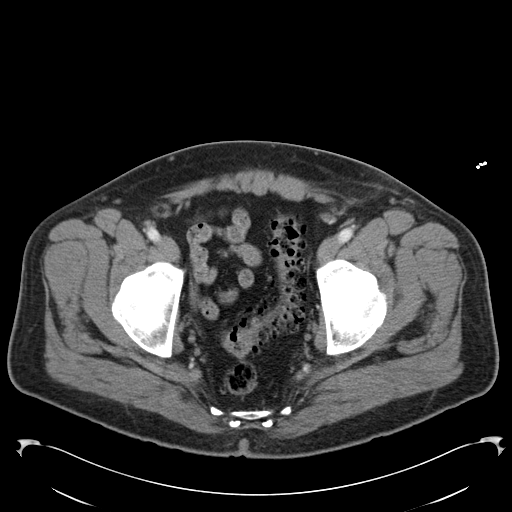
[im 40/142  soft-tissue]
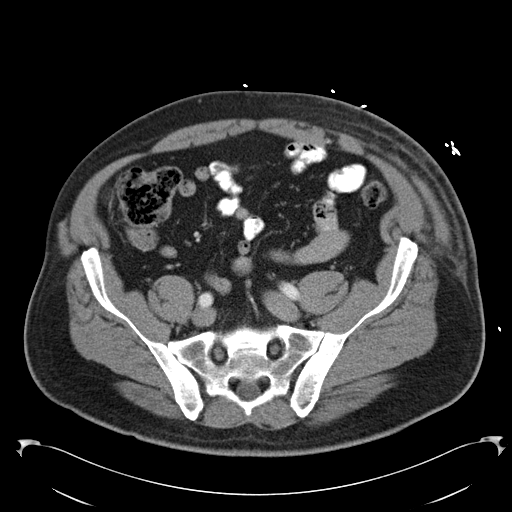
[im 55/142  soft-tissue]
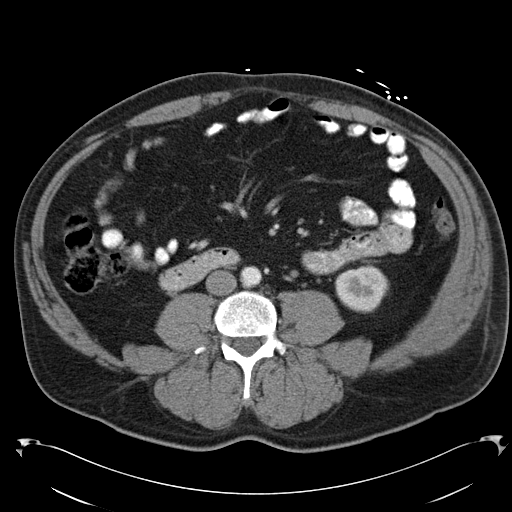
[im 71/142  soft-tissue]
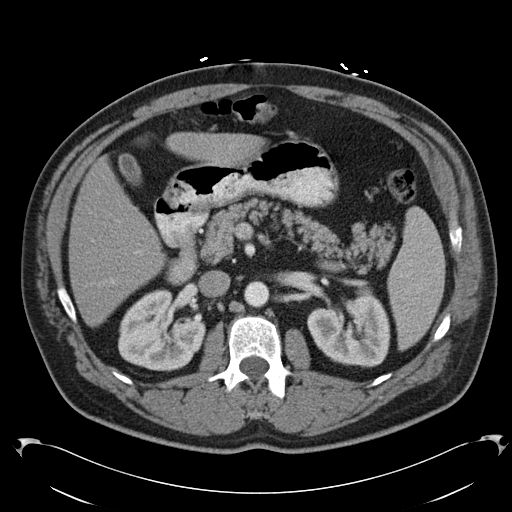
[im 87/142  soft-tissue]
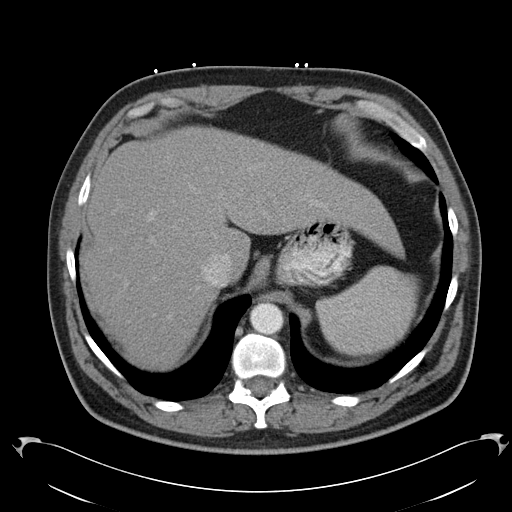
[im 102/142  soft-tissue]
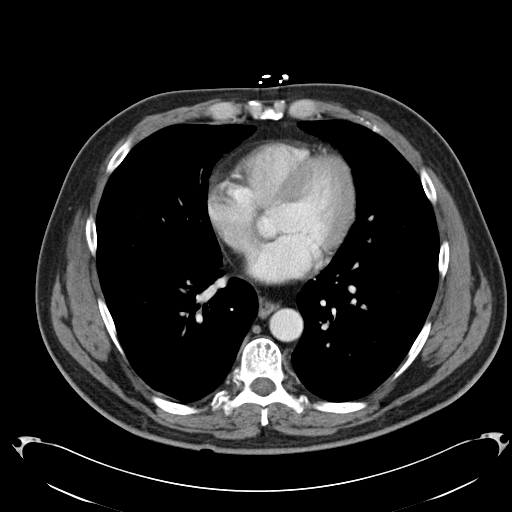
[im 118/142  soft-tissue]
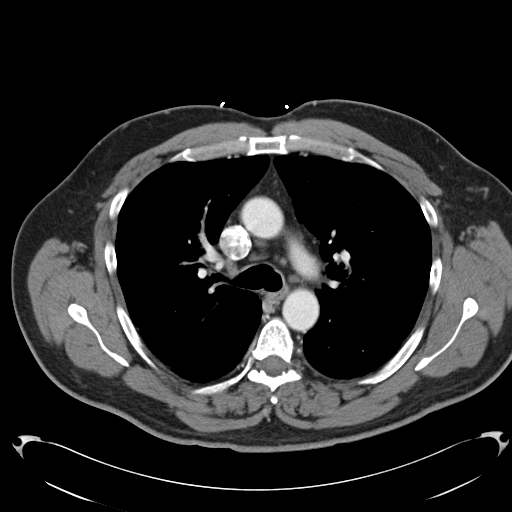
[im 134/142  soft-tissue]
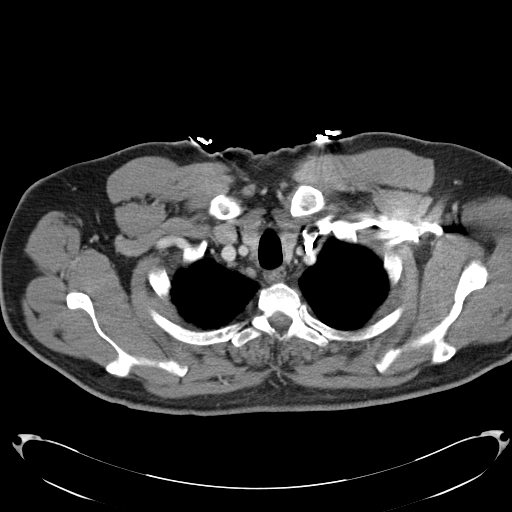
[im 134/142  bone]
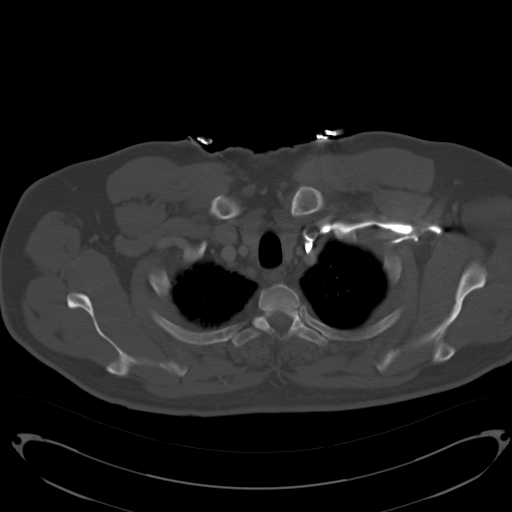

[Series 5: cor cap with cor cor · coronal · 0.81mm/px · 3 of 151 slices shown]
[im 51/151  soft-tissue]
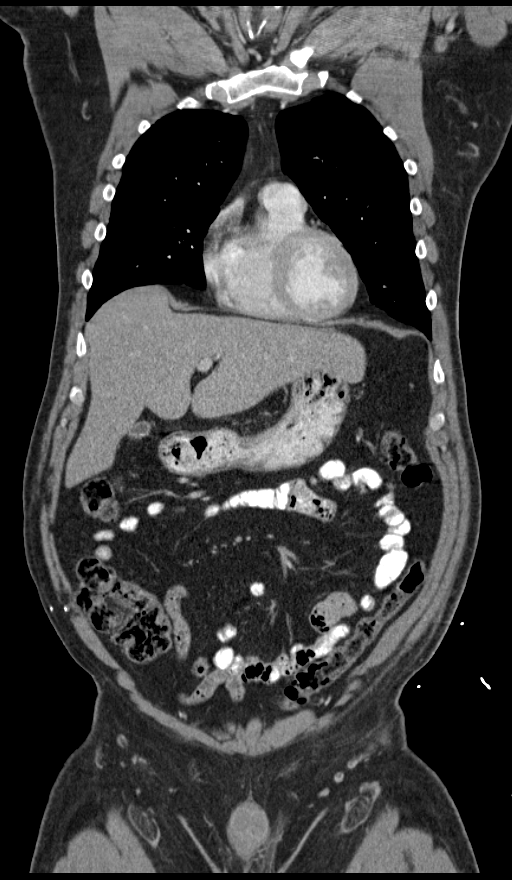
[im 67/151  soft-tissue]
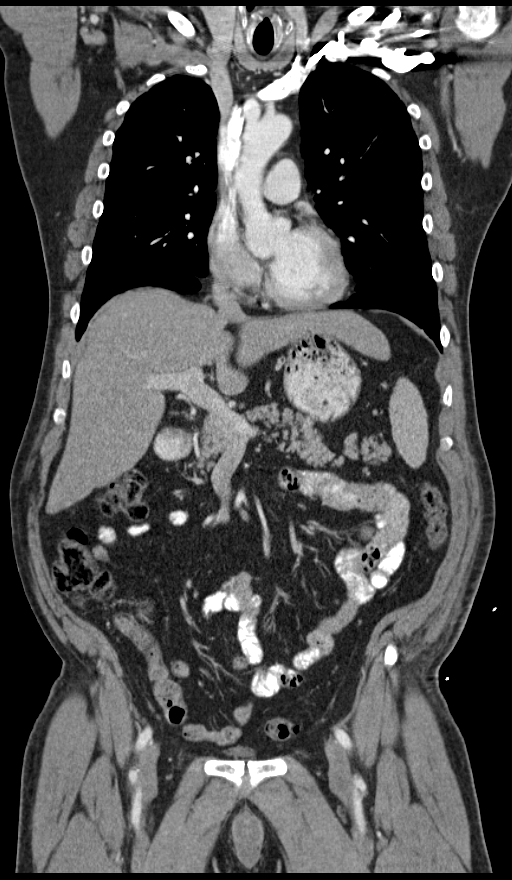
[im 84/151  soft-tissue]
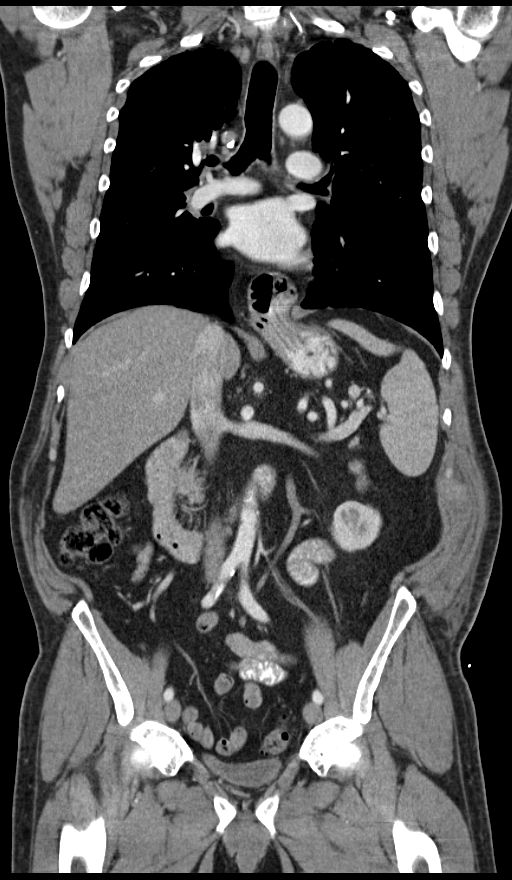

[12 of 46 positions shown; findings below may reference images not displayed]

FINDINGS: CT CHEST FINDINGS

Mediastinum: Unremarkable CT appearance of the thyroid gland. No
suspicious mediastinal or hilar adenopathy. No soft tissue
mediastinal mass. Moderate sliding hiatal hernia.

Heart/Vascular: 4 vessel aortic arch. The left vertebral artery
arises directly from the aorta. No evidence of aneurysm or
dissection. No significant atherosclerotic plaque. The main
pulmonary artery is within normal limits for size. No central PE.
The heart is within normal limits for size. No pericardial effusion.
Trace calcification along the course of the left anterior descending
coronary artery.

Lungs/Pleura: No evidence of pleural effusion. Combined paraseptal
and centrilobular emphysema of moderate severity. Bilobed irregular
nodule in the central aspect of the right upper lobe on images 23
and 24 of series 4. Measured in total, the nodule measures 1.9 x
cm. If the individual nodular components are measured separately
without the thin intervening bridge, they measure 7 and 9 mm
individually. Diffuse mild bronchial wall thickening in the lower
lobes. No evidence of edema.

Bones/Soft Tissues: No acute fracture or aggressive appearing lytic
or blastic osseous lesion. Please see findings in the left internal
oblique musculature below.

CT ABDOMEN AND PELVIS FINDINGS

Abdomen: Unremarkable CT appearance of the stomach, duodenum,
spleen, adrenal glands and pancreas. Normal hepatic contour and
morphology. No discrete hepatic lesion. Gallbladder is unremarkable.
No intra or extrahepatic biliary ductal dilatation.

Unremarkable appearance of the bilateral kidneys. No focal solid
lesion, hydronephrosis or nephrolithiasis.

Colonic diverticular disease without CT evidence of active
inflammation. No free fluid or abnormal adenopathy. No focal bowel
wall thickening or evidence of obstruction.

Pelvis: Unremarkable bladder, prostate gland and seminal vesicles.
No free fluid or suspicious adenopathy.

Bones/Soft Tissues: No acute fracture or aggressive appearing lytic
or blastic osseous lesion. Ovoid high attenuation collection within
the left lateral abdominal wall musculature measuring 3.9 x 2.5 cm.
The abnormality begins just inferior to the antral lateral aspect of
the left eleventh rib and is centered within the internal oblique
musculature. The imaging findings are most consistent with
intramuscular hematoma and likely reflecting tear at the origin of
the internal oblique muscle.

Vascular: Atherosclerotic vascular disease without significant
stenosis or aneurysmal dilatation.
IMPRESSION: CT CHEST

1. Partial tear with associated intramuscular hematoma within the
origin of the left internal oblique musculature just inferior to the
costochondral junction of the eleventh rib. No associated rib
fracture identified.
2. Moderate sliding hiatal hernia.
3. Trace atherosclerotic calcification along the course the left
anterior descending coronary artery. Please note that although the
presence of coronary artery calcium documents the presence of
coronary artery disease, the severity of this disease and any
potential stenosis cannot be assessed on this non-gated CT
examination. Assessment for potential risk factor modification,
dietary therapy or pharmacologic therapy may be warranted, if
clinically indicated.
4. Combined paraseptal and centrilobular pulmonary emphysema.
5. Irregular thin bilobed nodular opacity in the central aspect of
the right upper lobe. It measured in aggregate, the structure
measures up to 1.9 cm in diameter, however a more accurate
measurement of the individual nodular components measure 7 and 9 mm
individually. Followup chest CT at 3-6 months is recommended. This
recommendation follows the consensus statement: Guidelines for
Management of Small Pulmonary Nodules Detected on CT Scans: A
Statement from the [HOSPITAL] as published in Radiology
[H7]; [DATE].

CT ABD/PELVIS

1.  No acute abnormality in the abdomen or pelvis.
2. Colonic diverticular disease without CT evidence of active
inflammation.

## 2014-08-15 MED ORDER — ONDANSETRON HCL 4 MG/2ML IJ SOLN: 4.0000 mg | INTRAMUSCULAR | Status: DC

## 2014-08-15 MED ORDER — HYDROCODONE-ACETAMINOPHEN 5-325 MG PO TABS: 1.0000 | ORAL_TABLET | ORAL | Status: DC | PRN

## 2014-08-15 MED ORDER — ONDANSETRON HCL 4 MG/2ML IJ SOLN
INTRAMUSCULAR | Status: AC
  Filled 2014-08-15: qty 2

## 2014-08-15 MED ORDER — IOHEXOL 240 MG/ML SOLN: 25.0000 mL | Freq: Once | INTRAMUSCULAR | Status: DC | PRN

## 2014-08-15 MED ORDER — MORPHINE SULFATE 4 MG/ML IJ SOLN: 4.0000 mg | Freq: Once | INTRAMUSCULAR | Status: DC

## 2014-08-15 MED ORDER — IOHEXOL 350 MG/ML SOLN
100.0000 mL | Freq: Once | INTRAVENOUS | Status: AC | PRN
  Administered 2014-08-15: 100 mL via INTRAVENOUS

## 2014-08-15 MED ORDER — MORPHINE SULFATE 4 MG/ML IJ SOLN
INTRAMUSCULAR | Status: AC
  Filled 2014-08-15: qty 1

## 2014-08-15 NOTE — ED Notes (Signed)
Report from [place], [place].  Pt is in radiology at this time.

## 2014-08-15 NOTE — Discharge Instructions (Signed)
As we discussed, the you have a large bruise on the outside, the bleeding on the inside is relatively small.  It should need any further intervention.  However, if he continues to get worse, or if you develop any other symptoms that concern you, such as worsening pain, difficulty breathing, weakness, lightheadedness, dizziness, please return immediately to the emergency department.   Hematoma A hematoma is a collection of blood under the skin, in an organ, in a body space, in a joint space, or in other tissue. The blood can clot to form a lump that you can see and feel. The lump is often firm and may sometimes become sore and tender. Most hematomas get better in a few days to weeks. However, some hematomas may be serious and require medical care. Hematomas can range in size from very small to very large. CAUSES  A hematoma can be caused by a blunt or penetrating injury. It can also be caused by spontaneous leakage from a blood vessel under the skin. Spontaneous leakage from a blood vessel is more likely to occur in older people, especially those taking blood thinners. Sometimes, a hematoma can develop after certain medical procedures. SIGNS AND SYMPTOMS   A firm lump on the body.  Possible pain and tenderness in the area.  Bruising.Blue, dark blue, purple-red, or yellowish skin may appear at the site of the hematoma if the hematoma is close to the surface of the skin. For hematomas in deeper tissues or body spaces, the signs and symptoms may be subtle. For example, an intra-abdominal hematoma may cause abdominal pain, weakness, fainting, and shortness of breath. An intracranial hematoma may cause a headache or symptoms such as weakness, trouble speaking, or a change in consciousness. DIAGNOSIS  A hematoma can usually be diagnosed based on your medical history and a physical exam. Imaging tests may be needed if your health care provider suspects a hematoma in deeper tissues or body spaces, such as  the abdomen, head, or chest. These tests may include ultrasonography or a CT scan.  TREATMENT  Hematomas usually go away on their own over time. Rarely does the blood need to be drained out of the body. Large hematomas or those that may affect vital organs will sometimes need surgical drainage or monitoring. HOME CARE INSTRUCTIONS   Apply ice to the injured area:   Put ice in a plastic bag.   Place a towel between your skin and the bag.   Leave the ice on for 20 minutes, 2-3 times a day for the first 1 to 2 days.   After the first 2 days, switch to using warm compresses on the hematoma.   Elevate the injured area to help decrease pain and swelling. Wrapping the area with an elastic bandage may also be helpful. Compression helps to reduce swelling and promotes shrinking of the hematoma. Make sure the bandage is not wrapped too tight.   If your hematoma is on a lower extremity and is painful, crutches may be helpful for a couple days.   Only take over-the-counter or prescription medicines as directed by your health care provider. SEEK IMMEDIATE MEDICAL CARE IF:   You have increasing pain, or your pain is not controlled with medicine.   You have a fever.   You have worsening swelling or discoloration.   Your skin over the hematoma breaks or starts bleeding.   Your hematoma is in your chest or abdomen and you have weakness, shortness of breath, or a change in consciousness.  Your hematoma is on your scalp (caused by a fall or injury) and you have a worsening headache or a change in alertness or consciousness. MAKE SURE YOU:   Understand these instructions.  Will watch your condition.  Will get help right away if you are not doing well or get worse. Document Released: 10/15/2003 Document Revised: 11/02/2012 Document Reviewed: 08/10/2012 [facility] Patient Information 2015 [place], [place]. This information is not intended to replace advice given to you by your health care  provider. Make sure you discuss any questions you have with your health care provider.  Contusion A contusion is a deep bruise. Contusions are the result of an injury that caused bleeding under the skin. The contusion may turn blue, purple, or yellow. Minor injuries will give you a painless contusion, but more severe contusions may stay painful and swollen for a few weeks.  CAUSES  A contusion is usually caused by a blow, trauma, or direct force to an area of the body. SYMPTOMS   Swelling and redness of the injured area.  Bruising of the injured area.  Tenderness and soreness of the injured area.  Pain. DIAGNOSIS  The diagnosis can be made by taking a history and physical exam. An X-ray, CT scan, or MRI may be needed to determine if there were any associated injuries, such as fractures. TREATMENT  Specific treatment will depend on what area of the body was injured. In general, the best treatment for a contusion is resting, icing, elevating, and applying cold compresses to the injured area. Over-the-counter medicines may also be recommended for pain control. Ask your caregiver what the best treatment is for your contusion. HOME CARE INSTRUCTIONS   Put ice on the injured area.  Put ice in a plastic bag.  Place a towel between your skin and the bag.  Leave the ice on for 15-20 minutes, 3-4 times a day, or as directed by your health care provider.  Only take over-the-counter or prescription medicines for pain, discomfort, or fever as directed by your caregiver. Your caregiver may recommend avoiding anti-inflammatory medicines (aspirin, ibuprofen, and naproxen) for 48 hours because these medicines may increase bruising.  Rest the injured area.  If possible, elevate the injured area to reduce swelling. SEEK IMMEDIATE MEDICAL CARE IF:   You have increased bruising or swelling.  You have pain that is getting worse.  Your swelling or pain is not relieved with medicines. MAKE SURE YOU:    Understand these instructions.  Will watch your condition.  Will get help right away if you are not doing well or get worse. Document Released: 12/10/2004 Document Revised: 03/07/2013 Document Reviewed: 01/05/2011 [facility] Patient Information 2015 [place], [place]. This information is not intended to replace advice given to you by your health care provider. Make sure you discuss any questions you have with your health care provider.

## 2014-08-15 NOTE — ED Notes (Signed)
Pt arrives from [facility], pt states he helped family move Saturday and then Sunday was coughing and felt a "pop", pt states he noticed bruising on his left abdomen spreading, pt states he recently had a CT scan at [facility] and was evaluated for chest pain and coughing, pt awake and alert, pt abd slightly distended

## 2014-08-15 NOTE — ED Notes (Signed)
Pt states he was coughing on Sunday, felt a muscle pull in his left flank area, states the pain has traveled around to his abd, now c/o swelling in that area, was sent here by [facility] with xrays showing fluid in that area, Dr states he is concerned of rupture.

## 2014-08-15 NOTE — ED Notes (Signed)
No pain meds given at this time,  Pt resting in NAD watching TV, awaiting CT results and dispo

## 2014-08-15 NOTE — ED Provider Notes (Signed)
[facility] Provider Note  ____________________________________________  Time seen: Approximately 2:30 PM  I have reviewed the triage vital signs and the nursing notes.   HISTORY  Chief Complaint Flank Pain and Abdominal Pain    HPI [facility] is a 49 y.o. male with a history of COPD and who is being worked up for possible lung cancer at [name] who presents with pain in his left flank and a large spreading bruise that appeared yesterday.  He was helping a family member move furniture 4 days ago but did not sustain any injuries.  3 days ago he had a coughing spell and felt a pop in his left upper abdomen.  Yesterday he noticed a bruise that started relatively small but has spread to cover most of his lateral lower abdomen.  He went to the urgent care yesterday and they took some x-rays that reportedly showed "something "and he was told to go immediately to the emergency department, but he went home instead and decided to come into the ED today.  He reports a frequent cough which has been present for months and is part of the reason he has been evaluated recently at [facility].  His acute symptoms include the large, rapidly spreading bruise on the left side of his lower abdomen and flank as well as pain associated in that area.  He denies chest pain, acute shortness of breath, nausea, vomiting, diarrhea, and dysuria.Coughing and movement make the pain worse and he describes it as severe, at rest it is mild.   Past Medical History  Diagnosis Date  . Diabetes mellitus without complication     type 2  . COPD (chronic obstructive pulmonary disease)   . Renal disorder     kidney stone    There are no active problems to display for this patient.   Past Surgical History  Procedure Laterality Date  . Appendectomy      Current Outpatient Rx  Name  Route  Sig  Dispense  Refill  . Fluticasone-Salmeterol (ADVAIR) 250-50 MCG/DOSE AEPB    Inhalation   Inhale 1 puff into the lungs 2 (two) times daily.         . naproxen sodium (ANAPROX) 220 MG tablet   Oral   Take 220 mg by mouth 2 (two) times daily.         Marland Kitchen testosterone cypionate (DEPOTESTOTERONE CYPIONATE) 100 MG/ML injection   Intramuscular   Inject 1 mL into the muscle every 14 (fourteen) days.         Marland Kitchen tiotropium (SPIRIVA) 18 MCG inhalation capsule   Inhalation   Place 18 mcg into inhaler and inhale daily.         Marland Kitchen HYDROcodone-acetaminophen (NORCO/VICODIN) 5-325 MG per tablet   Oral   Take 1-2 tablets by mouth every 4 (four) hours as needed for moderate pain.   15 tablet   0     Allergies Review of patient's allergies indicates no known allergies.  No family history on file.  Social History History  Substance Use Topics  . Smoking status: Current Every Day Smoker -- 1.00 packs/day for 34 years    Types: Cigarettes  . Smokeless tobacco: Not on file  . Alcohol Use: 12.0 oz/week    20 Cans of beer per week     Comment: varies    Review of Systems Constitutional: No fever/chills Eyes: No visual changes. ENT: No sore throat. Cardiovascular: Denies chest pain. Respiratory: Chronic shortness of breath from  COPD with frequent cough, but not acutely worsening usual. Gastrointestinal: Left-sided abdominal and flank pain.  No nausea, no vomiting.  No diarrhea.  No constipation. Genitourinary: Negative for dysuria. Musculoskeletal: Left-sided flank pain Skin: Negative for rash. Neurological: Negative for headaches, focal weakness or numbness.  10-point ROS otherwise negative.  ____________________________________________   PHYSICAL EXAM:  VITAL SIGNS: ED Triage Vitals  Enc Vitals Group     BP 08/15/14 1359 183/95 mmHg     Pulse Rate 08/15/14 1359 85     Resp 08/15/14 1359 18     Temp 08/15/14 1359 98.1 F (36.7 C)     Temp Source 08/15/14 1359 Oral     SpO2 08/15/14 1359 99 %     Weight 08/15/14 1359 230 lb (104.327 kg)      Height 08/15/14 1359 6\' 2"  (1.88 m)     Head Cir --      Peak Flow --      Pain Score 08/15/14 1400 7     Pain Loc --      Pain Edu? --      Excl. in [place]? --     Constitutional: Alert and oriented. Well appearing and in no acute distress. Eyes: Conjunctivae are normal. PERRL. EOMI. Head: Atraumatic. Nose: No congestion/rhinnorhea. Mouth/Throat: Mucous membranes are moist.  Oropharynx non-erythematous. Neck: No stridor.   Cardiovascular: Normal rate, regular rhythm. Grossly normal heart sounds.  Good peripheral circulation. Respiratory: Normal respiratory effort.  No retractions.  Expiratory wheezing throughout all lung fields with mild to moderate rales. Gastrointestinal: Obese, Soft.  Mild tenderness to palpation of left lower abdomen at site of ecchymosis (see below). No distention. No abdominal bruits. No CVA tenderness. Musculoskeletal: No lower extremity tenderness nor edema.  No joint effusions. Neurologic:  Normal speech and language. No gross focal neurologic deficits are appreciated. Speech is normal. No gait instability. Skin:  Skin is warm, dry and intact.  There is a large area of ecchymosis on his left lateral lower abdomen measuring 28 cm x 10 cm.  A photo can be found in the media tab of chart review. Psychiatric: Mood and affect are normal. Speech and behavior are normal.  ____________________________________________   LABS (all labs ordered are listed, but only abnormal results are displayed)  Labs Reviewed  CBC WITH DIFFERENTIAL/PLATELET - Abnormal; Notable for the following:    RDW 15.6 (*)    All other components within normal limits  COMPREHENSIVE METABOLIC PANEL - Abnormal; Notable for the following:    Glucose, Bld 166 (*)    All other components within normal limits  URINALYSIS COMPLETEWITH MICROSCOPIC ([facility] ONLY) - Abnormal; Notable for the following:    Color, Urine YELLOW (*)    APPearance CLEAR (*)    All other components within normal limits   PROTIME-INR   ____________________________________________  EKG  ED ECG REPORT I, Nakeitha Milligan, the attending physician, personally viewed and interpreted this ECG.  Date: 08/15/2014 EKG Time: 14:17 Rate: 81 Rhythm: normal sinus rhythm QRS Axis: normal Intervals: normal ST/T Wave abnormalities: normal Conduction Disutrbances: none Narrative Interpretation: unremarkable  ____________________________________________  RADIOLOGY  Ct Chest W Contrast  08/15/2014   CLINICAL DATA:  49 year old male with 2 month history of cough and 3 day history of left lower chest and upper abdominal pain after something 'popped' during a coughing episode.  EXAM: CT CHEST, ABDOMEN AND PELVIS WITHOUT CONTRAST  TECHNIQUE: Multidetector CT imaging of the chest, abdomen and pelvis was performed following the standard protocol without IV contrast.  COMPARISON:  None.  FINDINGS: CT CHEST FINDINGS  Mediastinum: Unremarkable CT appearance of the thyroid gland. No suspicious mediastinal or hilar adenopathy. No soft tissue mediastinal mass. Moderate sliding hiatal hernia.  Heart/Vascular: 4 vessel aortic arch. The left vertebral artery arises directly from the aorta. No evidence of aneurysm or dissection. No significant atherosclerotic plaque. The main pulmonary artery is within normal limits for size. No central PE. The heart is within normal limits for size. No pericardial effusion. Trace calcification along the course of the left anterior descending coronary artery.  Lungs/Pleura: No evidence of pleural effusion. Combined paraseptal and centrilobular emphysema of moderate severity. Bilobed irregular nodule in the central aspect of the right upper lobe on images 23 and 24 of series 4. Measured in total, the nodule measures 1.9 x 0.4 cm. If the individual nodular components are measured separately without the thin intervening bridge, they measure 7 and 9 mm individually. Diffuse mild bronchial wall thickening in the lower  lobes. No evidence of edema.  Bones/Soft Tissues: No acute fracture or aggressive appearing lytic or blastic osseous lesion. Please see findings in the left internal oblique musculature below.  CT ABDOMEN AND PELVIS FINDINGS  Abdomen: Unremarkable CT appearance of the stomach, duodenum, spleen, adrenal glands and pancreas. Normal hepatic contour and morphology. No discrete hepatic lesion. Gallbladder is unremarkable. No intra or extrahepatic biliary ductal dilatation.  Unremarkable appearance of the bilateral kidneys. No focal solid lesion, hydronephrosis or nephrolithiasis.  Colonic diverticular disease without CT evidence of active inflammation. No free fluid or abnormal adenopathy. No focal bowel wall thickening or evidence of obstruction.  Pelvis: Unremarkable bladder, prostate gland and seminal vesicles. No free fluid or suspicious adenopathy.  Bones/Soft Tissues: No acute fracture or aggressive appearing lytic or blastic osseous lesion. Ovoid high attenuation collection within the left lateral abdominal wall musculature measuring 3.9 x 2.5 cm. The abnormality begins just inferior to the antral lateral aspect of the left eleventh rib and is centered within the internal oblique musculature. The imaging findings are most consistent with intramuscular hematoma and likely reflecting tear at the origin of the internal oblique muscle.  Vascular: Atherosclerotic vascular disease without significant stenosis or aneurysmal dilatation.  IMPRESSION: CT CHEST  1. Partial tear with associated intramuscular hematoma within the origin of the left internal oblique musculature just inferior to the costochondral junction of the eleventh rib. No associated rib fracture identified. 2. Moderate sliding hiatal hernia. 3. Trace atherosclerotic calcification along the course the left anterior descending coronary artery. Please note that although the presence of coronary artery calcium documents the presence of coronary artery disease,  the severity of this disease and any potential stenosis cannot be assessed on this non-gated CT examination. Assessment for potential risk factor modification, dietary therapy or pharmacologic therapy may be warranted, if clinically indicated. 4. Combined paraseptal and centrilobular pulmonary emphysema. 5. Irregular thin bilobed nodular opacity in the central aspect of the right upper lobe. It measured in aggregate, the structure measures up to 1.9 cm in diameter, however a more accurate measurement of the individual nodular components measure 7 and 9 mm individually. Followup chest CT at 3-6 months is recommended. This recommendation follows the consensus statement: Guidelines for Management of Small Pulmonary Nodules Detected on CT Scans: A Statement from the [place] as published in Radiology 2005; 237:395-400.  CT ABD/PELVIS  1.  No acute abnormality in the abdomen or pelvis. 2. Colonic diverticular disease without CT evidence of active inflammation.   Electronically Signed   By: Myrle Sheng  Laurence Ferrari M.D.   On: 08/15/2014 15:41   Ct Abdomen Pelvis W Contrast  08/15/2014   CLINICAL DATA:  49 year old male with 2 month history of cough and 3 day history of left lower chest and upper abdominal pain after something 'popped' during a coughing episode.  EXAM: CT CHEST, ABDOMEN AND PELVIS WITHOUT CONTRAST  TECHNIQUE: Multidetector CT imaging of the chest, abdomen and pelvis was performed following the standard protocol without IV contrast.  COMPARISON:  None.  FINDINGS: CT CHEST FINDINGS  Mediastinum: Unremarkable CT appearance of the thyroid gland. No suspicious mediastinal or hilar adenopathy. No soft tissue mediastinal mass. Moderate sliding hiatal hernia.  Heart/Vascular: 4 vessel aortic arch. The left vertebral artery arises directly from the aorta. No evidence of aneurysm or dissection. No significant atherosclerotic plaque. The main pulmonary artery is within normal limits for size. No central PE. The  heart is within normal limits for size. No pericardial effusion. Trace calcification along the course of the left anterior descending coronary artery.  Lungs/Pleura: No evidence of pleural effusion. Combined paraseptal and centrilobular emphysema of moderate severity. Bilobed irregular nodule in the central aspect of the right upper lobe on images 23 and 24 of series 4. Measured in total, the nodule measures 1.9 x 0.4 cm. If the individual nodular components are measured separately without the thin intervening bridge, they measure 7 and 9 mm individually. Diffuse mild bronchial wall thickening in the lower lobes. No evidence of edema.  Bones/Soft Tissues: No acute fracture or aggressive appearing lytic or blastic osseous lesion. Please see findings in the left internal oblique musculature below.  CT ABDOMEN AND PELVIS FINDINGS  Abdomen: Unremarkable CT appearance of the stomach, duodenum, spleen, adrenal glands and pancreas. Normal hepatic contour and morphology. No discrete hepatic lesion. Gallbladder is unremarkable. No intra or extrahepatic biliary ductal dilatation.  Unremarkable appearance of the bilateral kidneys. No focal solid lesion, hydronephrosis or nephrolithiasis.  Colonic diverticular disease without CT evidence of active inflammation. No free fluid or abnormal adenopathy. No focal bowel wall thickening or evidence of obstruction.  Pelvis: Unremarkable bladder, prostate gland and seminal vesicles. No free fluid or suspicious adenopathy.  Bones/Soft Tissues: No acute fracture or aggressive appearing lytic or blastic osseous lesion. Ovoid high attenuation collection within the left lateral abdominal wall musculature measuring 3.9 x 2.5 cm. The abnormality begins just inferior to the antral lateral aspect of the left eleventh rib and is centered within the internal oblique musculature. The imaging findings are most consistent with intramuscular hematoma and likely reflecting tear at the origin of the  internal oblique muscle.  Vascular: Atherosclerotic vascular disease without significant stenosis or aneurysmal dilatation.  IMPRESSION: CT CHEST  1. Partial tear with associated intramuscular hematoma within the origin of the left internal oblique musculature just inferior to the costochondral junction of the eleventh rib. No associated rib fracture identified. 2. Moderate sliding hiatal hernia. 3. Trace atherosclerotic calcification along the course the left anterior descending coronary artery. Please note that although the presence of coronary artery calcium documents the presence of coronary artery disease, the severity of this disease and any potential stenosis cannot be assessed on this non-gated CT examination. Assessment for potential risk factor modification, dietary therapy or pharmacologic therapy may be warranted, if clinically indicated. 4. Combined paraseptal and centrilobular pulmonary emphysema. 5. Irregular thin bilobed nodular opacity in the central aspect of the right upper lobe. It measured in aggregate, the structure measures up to 1.9 cm in diameter, however a more accurate measurement of the  individual nodular components measure 7 and 9 mm individually. Followup chest CT at 3-6 months is recommended. This recommendation follows the consensus statement: Guidelines for Management of Small Pulmonary Nodules Detected on CT Scans: A Statement from the [place] as published in Radiology 2005; 237:395-400.  CT ABD/PELVIS  1.  No acute abnormality in the abdomen or pelvis. 2. Colonic diverticular disease without CT evidence of active inflammation.   Electronically Signed   By: Jacqulynn Cadet M.D.   On: 08/15/2014 15:41    ____________________________________________   INITIAL IMPRESSION / ASSESSMENT AND PLAN / ED COURSE  Pertinent labs & imaging results that were available during my care of the patient were reviewed by me and considered in my medical decision making (see chart for  details).  Patient is hemodynamically stable and in no acute distress.  However, he has a rapidly spreading ecchymosis of the left lower abdomen after feeling something pop.  He brought a CD with his outpatient x-rays but I cannot get them to load him my computer.  We will emergently obtain a CT chest abdomen pelvis with contrast.  There is no evidence of pneumothorax at this time given no increased work of breathing and equal breath sounds all lung fields.  ----------------------------------------- 6:19 PM on 08/15/2014 -----------------------------------------  The patient has a relatively small tear in his intercostals that has caused a relatively small (approximately 4 x 3 cm) hematoma.  He has been hemodynamically stable during his stay in the emergency department and is sleeping comfortably.  I discussed the case with Dr. Rexene Edison by phone who looked at the CT scan.  He felt that if the patient was stable and the pain was controlled that it would be fine for him to follow-up as an outpatient.  I discussed this with the patient and he agreed.  He does not want to come in the [facility] and in fact wants to go to work tomorrow.  He lives with his son who will keep an eye on him and they will return to the emergency department if he gets worse.  His hemoglobin is normal and stable. ____________________________________________  FINAL CLINICAL IMPRESSION(S) / ED DIAGNOSES  Final diagnoses:  Hematoma of abdominal wall, initial encounter      NEW MEDICATIONS STARTED DURING THIS VISIT:  Discharge Medication List as of 08/15/2014  6:39 PM    START taking these medications   Details  HYDROcodone-acetaminophen (NORCO/VICODIN) 5-325 MG per tablet Take 1-2 tablets by mouth every 4 (four) hours as needed for moderate pain., Starting 08/15/2014, Until Discontinued, Print         Hinda Kehr, MD 08/15/14 2003

## 2014-08-15 NOTE — ED Notes (Signed)
pts BP has been somewhat elevated during his ED visit.   At discharge it had increased even more.  Pt was offered pain medication but he refused because he was driving home and did not want to have to call a ride.  Pt also reports that he stoppped taking his lisinopril 2 months ago.   This was all discussed with Dr Karma Greaser, no additional orders but per Dr Karma Greaser pt was advised to take his lisinopril as prescribed and follow up with PCP about his BP.  Pt verbalized understanding and was in agreement with plan.

## 2014-08-19 ENCOUNTER — Emergency Department
Admission: EM | Admit: 2014-08-19 | Discharge: 2014-08-19 | Disposition: A | Discharge status: Home / Self Care | Payer: Managed Care, Other (non HMO) | Attending: Emergency Medicine | Admitting: Emergency Medicine

## 2014-08-19 ENCOUNTER — Encounter: Payer: Self-pay | Admitting: Emergency Medicine

## 2014-08-19 DIAGNOSIS — S301XXS Contusion of abdominal wall, sequela: Secondary | ICD-10-CM | POA: Insufficient documentation

## 2014-08-19 DIAGNOSIS — Z720 Tobacco use: Secondary | ICD-10-CM | POA: Insufficient documentation

## 2014-08-19 DIAGNOSIS — S3991XS Unspecified injury of abdomen, sequela: Secondary | ICD-10-CM | POA: Diagnosis present

## 2014-08-19 DIAGNOSIS — E119 Type 2 diabetes mellitus without complications: Secondary | ICD-10-CM | POA: Insufficient documentation

## 2014-08-19 DIAGNOSIS — Z79899 Other long term (current) drug therapy: Secondary | ICD-10-CM | POA: Insufficient documentation

## 2014-08-19 DIAGNOSIS — X58XXXS Exposure to other specified factors, sequela: Secondary | ICD-10-CM | POA: Insufficient documentation

## 2014-08-19 DIAGNOSIS — Z7951 Long term (current) use of inhaled steroids: Secondary | ICD-10-CM | POA: Diagnosis not present

## 2014-08-19 DIAGNOSIS — S3692XS Contusion of unspecified intra-abdominal organ, sequela: Secondary | ICD-10-CM

## 2014-08-19 DIAGNOSIS — IMO0001 Reserved for inherently not codable concepts without codable children: Secondary | ICD-10-CM

## 2014-08-19 DIAGNOSIS — Z9089 Acquired absence of other organs: Secondary | ICD-10-CM | POA: Insufficient documentation

## 2014-08-19 LAB — CBC
HCT: 49.8 % (ref 40.0–52.0)
Hemoglobin: 16.2 g/dL (ref 13.0–18.0)
MCH: 26.3 pg (ref 26.0–34.0)
MCHC: 32.5 g/dL (ref 32.0–36.0)
MCV: 80.8 fL (ref 80.0–100.0)
PLATELETS: 259 10*3/uL (ref 150–440)
RBC: 6.16 MIL/uL — AB (ref 4.40–5.90)
RDW: 15.5 % — ABNORMAL HIGH (ref 11.5–14.5)
WBC: 10.6 10*3/uL (ref 3.8–10.6)

## 2014-08-19 LAB — COMPREHENSIVE METABOLIC PANEL
ALBUMIN: 4.3 g/dL (ref 3.5–5.0)
ALT: 22 U/L (ref 17–63)
AST: 19 U/L (ref 15–41)
Alkaline Phosphatase: 79 U/L (ref 38–126)
Anion gap: 10 (ref 5–15)
BUN: 17 mg/dL (ref 6–20)
CALCIUM: 9.2 mg/dL (ref 8.9–10.3)
CO2: 25 mmol/L (ref 22–32)
CREATININE: 0.95 mg/dL (ref 0.61–1.24)
Chloride: 101 mmol/L (ref 101–111)
GFR calc Af Amer: >60 mL/min (ref >60)
Glucose, Bld: 133 mg/dL — ABNORMAL HIGH (ref 65–99)
POTASSIUM: 4.1 mmol/L (ref 3.5–5.1)
Sodium: 136 mmol/L (ref 135–145)
Total Bilirubin: 1 mg/dL (ref 0.3–1.2)
Total Protein: 7.8 g/dL (ref 6.5–8.1)

## 2014-08-19 MED ORDER — HYDROCODONE-ACETAMINOPHEN 5-325 MG PO TABS: 1.0000 | ORAL_TABLET | ORAL | Status: DC | PRN

## 2014-08-19 MED ORDER — MORPHINE SULFATE 4 MG/ML IJ SOLN
INTRAMUSCULAR | Status: AC
  Administered 2014-08-19: 4 mg via INTRAVENOUS
  Filled 2014-08-19: qty 1

## 2014-08-19 MED ORDER — ONDANSETRON HCL 4 MG/2ML IJ SOLN
4.0000 mg | Freq: Once | INTRAMUSCULAR | Status: AC
  Administered 2014-08-19: 4 mg via INTRAVENOUS

## 2014-08-19 MED ORDER — ONDANSETRON HCL 4 MG/2ML IJ SOLN
INTRAMUSCULAR | Status: AC
  Administered 2014-08-19: 4 mg via INTRAVENOUS
  Filled 2014-08-19: qty 2

## 2014-08-19 MED ORDER — MORPHINE SULFATE 4 MG/ML IJ SOLN
4.0000 mg | Freq: Once | INTRAMUSCULAR | Status: AC
  Administered 2014-08-19: 4 mg via INTRAVENOUS

## 2014-08-19 NOTE — ED Notes (Signed)
Pt was seen in Er for increased abd pain and brusing last week, pt states the pain is increasing and now whenever he coughs he is in extreme pain, pt awake and alert during assessment in no distress

## 2014-08-19 NOTE — ED Notes (Signed)
Pt just here on 6/1 seen for abd pain, dx with abd hematoma. Told to to follow up with surgery. Pt given norco for pain. Pt returns today with increased pain and is out of pain meds.

## 2014-08-19 NOTE — ED Provider Notes (Signed)
[name] Emergency Department Provider Note  ____________________________________________  Time seen: On arrival  I have reviewed the triage vital signs and the nursing notes.   HISTORY  Chief Complaint Abdominal Pain      HPI Darren Allen is a 49 y.o. male who presents with left flank painthat has been fairly continuous for 4 days. Patient was diagnosed with a left-sided internal oblique muscle tear which had caused a hematoma which led to a abdominal wall ecchymosis. Patient was prescribed narcotic pain medication which is now ran out and he is continuing to have pain in the left flank which is severe and sharp in nature. He denies dizziness. He denies nausea denies vomiting     Past Medical History  Diagnosis Date  . Diabetes mellitus without complication     type 2  . COPD (chronic obstructive pulmonary disease)   . Renal disorder     kidney stone    There are no active problems to display for this patient.   Past Surgical History  Procedure Laterality Date  . Appendectomy      Current Outpatient Rx  Name  Route  Sig  Dispense  Refill  . Fluticasone-Salmeterol (ADVAIR) 250-50 MCG/DOSE AEPB   Inhalation   Inhale 1 puff into the lungs 2 (two) times daily.         Marland Kitchen HYDROcodone-acetaminophen (NORCO/VICODIN) 5-325 MG per tablet   Oral   Take 1-2 tablets by mouth every 4 (four) hours as needed for moderate pain.   15 tablet   0   . naproxen sodium (ANAPROX) 220 MG tablet   Oral   Take 220 mg by mouth 2 (two) times daily.         Marland Kitchen testosterone cypionate (DEPOTESTOTERONE CYPIONATE) 100 MG/ML injection   Intramuscular   Inject 1 mL into the muscle every 14 (fourteen) days.         Marland Kitchen tiotropium (SPIRIVA) 18 MCG inhalation capsule   Inhalation   Place 18 mcg into inhaler and inhale daily.           Allergies Review of patient's allergies indicates no known allergies.  No family history on file.  Social  History History  Substance Use Topics  . Smoking status: Current Every Day Smoker -- 1.00 packs/day for 34 years    Types: Cigarettes  . Smokeless tobacco: Not on file  . Alcohol Use: 12.0 oz/week    20 Cans of beer per week     Comment: varies    Review of Systems  Constitutional: Negative for fever. Eyes: Negative for visual changes. ENT: Negative for sore throat Cardiovascular: Negative for chest pain. Respiratory: Negative for shortness of breath. Gastrointestinal: Positive for left flank pain Genitourinary: Negative for dysuria. Musculoskeletal: Negative for back pain. Skin: Positive for ecchymosis Neurological: Negative for headaches or focal weakness   10-point ROS otherwise negative.  ____________________________________________   PHYSICAL EXAM:  VITAL SIGNS: ED Triage Vitals  Enc Vitals Group     BP 08/19/14 1317 138/89 mmHg     Pulse Rate 08/19/14 1317 102     Resp 08/19/14 1317 20     Temp 08/19/14 1317 98.1 F (36.7 C)     Temp Source 08/19/14 1317 Oral     SpO2 08/19/14 1317 94 %     Weight 08/19/14 1317 230 lb (104.327 kg)     Height 08/19/14 1317 6\' 2"  (1.88 m)     Head Cir --      Peak  Flow --      Pain Score 08/19/14 1317 8     Pain Loc --      Pain Edu? --      Excl. in [place]? --      Constitutional: Alert and oriented. Well appearing and in no distress. Eyes: Conjunctivae are normal. PERRL. ENT   Head: Normocephalic and atraumatic.   Nose: No rhinnorhea.   Mouth/Throat: Mucous membranes are moist. Cardiovascular: Normal rate, regular rhythm. Normal and symmetric distal pulses are present in all extremities. No murmurs, rubs, or gallops. Respiratory: Normal respiratory effort without tachypnea nor retractions. Breath sounds are clear and equal bilaterally.  Gastrointestinal: Soft and non-tender in all quadrants. No distention. There is no CVA tenderness. Genitourinary: deferred Musculoskeletal: Nontender with normal range of  motion in all extremities. No lower extremity tenderness nor edema. Neurologic:  Normal speech and language. No gross focal neurologic deficits are appreciated. Skin:  Large ecchymosis extending from  the inferior midline of the abdomen to the left flank. In comparison to the picture taken from last visit there is no appreciable change. Psychiatric: Mood and affect are normal. Patient exhibits appropriate insight and judgment.  ____________________________________________    LABS (pertinent positives/negatives)  Labs Reviewed  CBC - Abnormal; Notable for the following:    RBC 6.16 (*)    RDW 15.5 (*)    All other components within normal limits  COMPREHENSIVE METABOLIC PANEL - Abnormal; Notable for the following:    Glucose, Bld 133 (*)    All other components within normal limits    ____________________________________________   EKG  None  ____________________________________________    RADIOLOGY  None  ____________________________________________   PROCEDURES  Procedure(s) performed: none  Critical Care performed: none  ____________________________________________   INITIAL IMPRESSION / ASSESSMENT AND PLAN / ED COURSE  Pertinent labs & imaging results that were available during my care of the patient were reviewed by me and considered in my medical decision making (see chart for details).  Patient with continued pain from left oblique muscle tear and hematoma. He denies dizziness. However given continued pain that has not improved over the last 4-5 days we will check hemoglobin and we may have to consider whether to repeat imaging ----------------------------------------- 2:25 PM on 08/19/2014 -----------------------------------------  Hemoglobin stable at 16.2. Patient feels well after pain medication. He agrees that we will avoid imaging at this time since he has PCP follow-up in 2 days and he feels well. He knows to return if any dizziness lightheadedness,  etc. ____________________________________________   FINAL CLINICAL IMPRESSION(S) / ED DIAGNOSES  Final diagnoses:  Abdominal hematoma, sequela     Lavonia Drafts, MD 08/19/14 1535

## 2015-02-04 ENCOUNTER — Encounter: Payer: Self-pay | Admitting: Emergency Medicine

## 2015-02-04 ENCOUNTER — Observation Stay
Admission: EM | Admit: 2015-02-04 | Discharge: 2015-02-06 | Disposition: A | Discharge status: Home / Self Care | Payer: Managed Care, Other (non HMO) | Attending: Surgery | Admitting: Surgery

## 2015-02-04 ENCOUNTER — Emergency Department: Payer: Managed Care, Other (non HMO) | Admitting: Anesthesiology

## 2015-02-04 ENCOUNTER — Encounter
Admission: EM | Disposition: A | Discharge status: Home / Self Care | Payer: Self-pay | Source: Home / Self Care | Attending: Emergency Medicine

## 2015-02-04 ENCOUNTER — Emergency Department: Payer: Managed Care, Other (non HMO)

## 2015-02-04 DIAGNOSIS — E119 Type 2 diabetes mellitus without complications: Secondary | ICD-10-CM | POA: Diagnosis not present

## 2015-02-04 DIAGNOSIS — Z6830 Body mass index (BMI) 30.0-30.9, adult: Secondary | ICD-10-CM | POA: Diagnosis not present

## 2015-02-04 DIAGNOSIS — I10 Essential (primary) hypertension: Secondary | ICD-10-CM | POA: Insufficient documentation

## 2015-02-04 DIAGNOSIS — K449 Diaphragmatic hernia without obstruction or gangrene: Secondary | ICD-10-CM | POA: Insufficient documentation

## 2015-02-04 DIAGNOSIS — N200 Calculus of kidney: Secondary | ICD-10-CM | POA: Diagnosis not present

## 2015-02-04 DIAGNOSIS — R109 Unspecified abdominal pain: Secondary | ICD-10-CM | POA: Insufficient documentation

## 2015-02-04 DIAGNOSIS — K610 Anal abscess: Secondary | ICD-10-CM | POA: Diagnosis present

## 2015-02-04 DIAGNOSIS — K219 Gastro-esophageal reflux disease without esophagitis: Secondary | ICD-10-CM | POA: Insufficient documentation

## 2015-02-04 DIAGNOSIS — K625 Hemorrhage of anus and rectum: Secondary | ICD-10-CM | POA: Insufficient documentation

## 2015-02-04 DIAGNOSIS — K6289 Other specified diseases of anus and rectum: Secondary | ICD-10-CM | POA: Insufficient documentation

## 2015-02-04 DIAGNOSIS — E669 Obesity, unspecified: Secondary | ICD-10-CM | POA: Diagnosis not present

## 2015-02-04 DIAGNOSIS — R000 Tachycardia, unspecified: Secondary | ICD-10-CM | POA: Diagnosis not present

## 2015-02-04 DIAGNOSIS — E785 Hyperlipidemia, unspecified: Secondary | ICD-10-CM | POA: Insufficient documentation

## 2015-02-04 DIAGNOSIS — J449 Chronic obstructive pulmonary disease, unspecified: Secondary | ICD-10-CM | POA: Diagnosis not present

## 2015-02-04 DIAGNOSIS — F17210 Nicotine dependence, cigarettes, uncomplicated: Secondary | ICD-10-CM | POA: Insufficient documentation

## 2015-02-04 DIAGNOSIS — G4730 Sleep apnea, unspecified: Secondary | ICD-10-CM | POA: Diagnosis not present

## 2015-02-04 DIAGNOSIS — E291 Testicular hypofunction: Secondary | ICD-10-CM | POA: Diagnosis not present

## 2015-02-04 DIAGNOSIS — K611 Rectal abscess: Secondary | ICD-10-CM | POA: Diagnosis present

## 2015-02-04 DIAGNOSIS — Z79899 Other long term (current) drug therapy: Secondary | ICD-10-CM | POA: Diagnosis not present

## 2015-02-04 DIAGNOSIS — K5730 Diverticulosis of large intestine without perforation or abscess without bleeding: Secondary | ICD-10-CM | POA: Insufficient documentation

## 2015-02-04 HISTORY — DX: Essential (primary) hypertension: I10

## 2015-02-04 HISTORY — PX: INCISION AND DRAINAGE PERIRECTAL ABSCESS: SHX1804

## 2015-02-04 HISTORY — PX: RECTAL EXAM UNDER ANESTHESIA: SHX6399

## 2015-02-04 HISTORY — DX: Calculus of kidney: N20.0

## 2015-02-04 LAB — URINALYSIS COMPLETE WITH MICROSCOPIC (ARMC ONLY)
BACTERIA UA: NONE SEEN
Bilirubin Urine: NEGATIVE
Glucose, UA: >500 mg/dL — AB
Hgb urine dipstick: NEGATIVE
Ketones, ur: NEGATIVE mg/dL
Leukocytes, UA: NEGATIVE
NITRITE: NEGATIVE
PH: 6 (ref 5.0–8.0)
Protein, ur: NEGATIVE mg/dL
Specific Gravity, Urine: 1.014 (ref 1.005–1.030)
Squamous Epithelial / LPF: NONE SEEN
WBC UA: NONE SEEN WBC/hpf (ref 0–5)

## 2015-02-04 LAB — CBC WITH DIFFERENTIAL/PLATELET
BASOS PCT: 1 %
Basophils Absolute: 0.1 10*3/uL (ref 0–0.1)
Eosinophils Absolute: 0.2 10*3/uL (ref 0–0.7)
Eosinophils Relative: 2 %
HEMATOCRIT: 47.3 % (ref 40.0–52.0)
Hemoglobin: 15.5 g/dL (ref 13.0–18.0)
LYMPHS ABS: 1.1 10*3/uL (ref 1.0–3.6)
Lymphocytes Relative: 9 %
MCH: 26.5 pg (ref 26.0–34.0)
MCHC: 32.8 g/dL (ref 32.0–36.0)
MCV: 80.9 fL (ref 80.0–100.0)
MONO ABS: 1.4 10*3/uL — AB (ref 0.2–1.0)
MONOS PCT: 12 %
NEUTROS ABS: 8.8 10*3/uL — AB (ref 1.4–6.5)
Neutrophils Relative %: 76 %
PLATELETS: 214 10*3/uL (ref 150–440)
RBC: 5.85 MIL/uL (ref 4.40–5.90)
RDW: 15.3 % — ABNORMAL HIGH (ref 11.5–14.5)
WBC: 11.5 10*3/uL — ABNORMAL HIGH (ref 3.8–10.6)

## 2015-02-04 LAB — COMPREHENSIVE METABOLIC PANEL
ALT: 19 U/L (ref 17–63)
AST: 17 U/L (ref 15–41)
Albumin: 3.8 g/dL (ref 3.5–5.0)
Alkaline Phosphatase: 69 U/L (ref 38–126)
Anion gap: 9 (ref 5–15)
BUN: 10 mg/dL (ref 6–20)
CHLORIDE: 102 mmol/L (ref 101–111)
CO2: 24 mmol/L (ref 22–32)
CREATININE: 0.87 mg/dL (ref 0.61–1.24)
Calcium: 8.8 mg/dL — ABNORMAL LOW (ref 8.9–10.3)
Glucose, Bld: 234 mg/dL — ABNORMAL HIGH (ref 65–99)
Potassium: 4 mmol/L (ref 3.5–5.1)
Sodium: 135 mmol/L (ref 135–145)
Total Bilirubin: 0.6 mg/dL (ref 0.3–1.2)
Total Protein: 7.4 g/dL (ref 6.5–8.1)

## 2015-02-04 LAB — GLUCOSE, CAPILLARY
GLUCOSE-CAPILLARY: 114 mg/dL — AB (ref 65–99)
Glucose-Capillary: 131 mg/dL — ABNORMAL HIGH (ref 65–99)

## 2015-02-04 IMAGING — CT CT ABD-PELV W/ CM
1 of 3 series · 14 of 32 positions shown, 19 images · IV contrast (omnipaque)
Comparison: [DATE]

CLINICAL DATA: Rectal tear, rectal bleeding and pain for 7 days.

EXAM:
CT ABDOMEN AND PELVIS WITH CONTRAST
TECHNIQUE: Multidetector CT imaging of the abdomen and pelvis was performed
using the standard protocol following bolus administration of
intravenous contrast.
CONTRAST:  100mL OMNIPAQUE IOHEXOL 350 MG/ML SOLN

[Series 2: routine abd pel with · axial · 0.93mm/px · z∈[-1206,-736]mm · 14 of 106 slices shown, 19 images]
[im 6/106  soft-tissue]
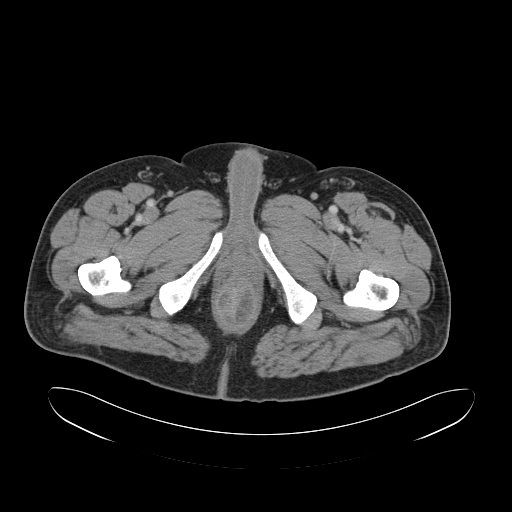
[im 6/106  bone]
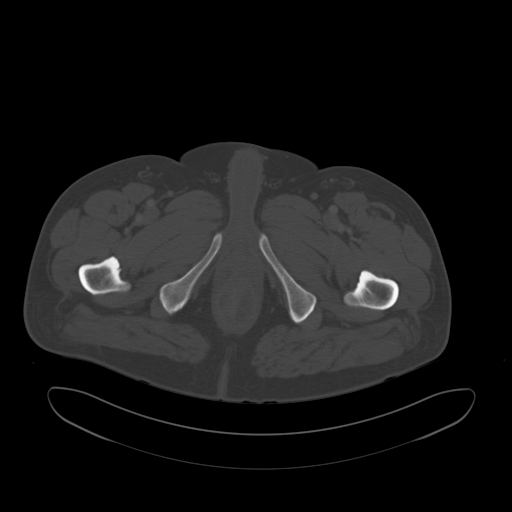
[im 17/106  soft-tissue]
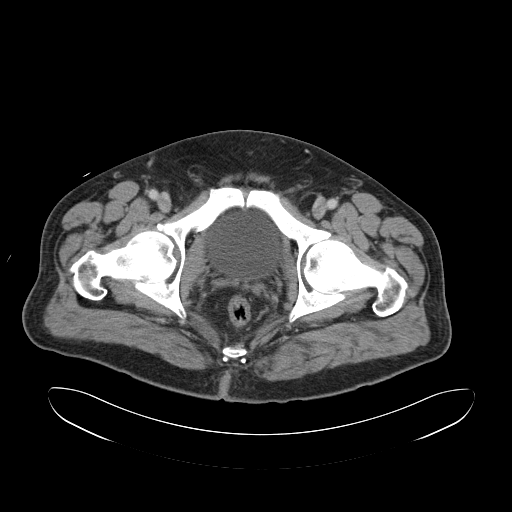
[im 23/106  soft-tissue]
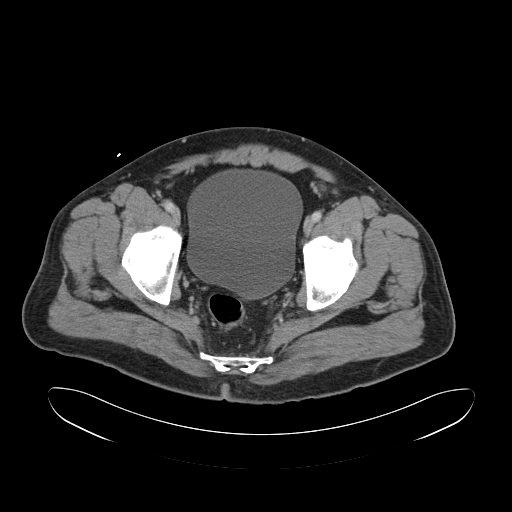
[im 28/106  soft-tissue]
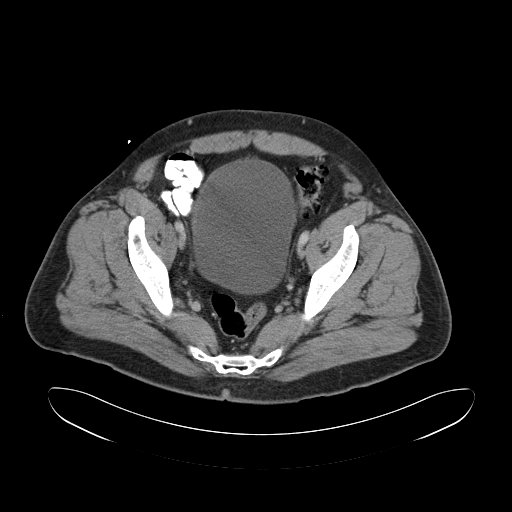
[im 39/106  soft-tissue]
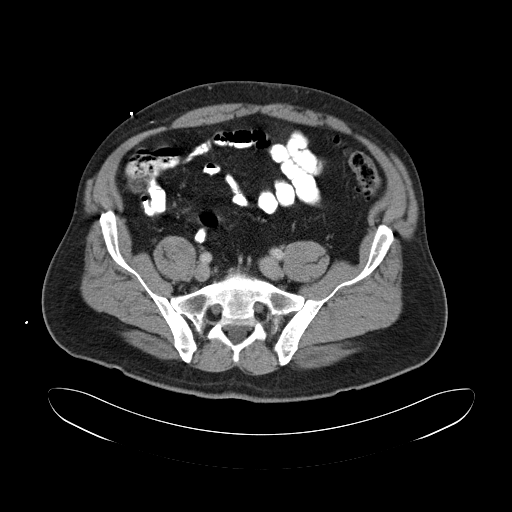
[im 45/106  soft-tissue]
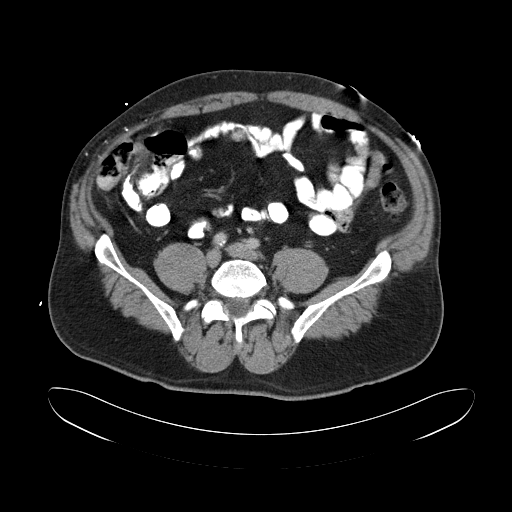
[im 56/106  soft-tissue]
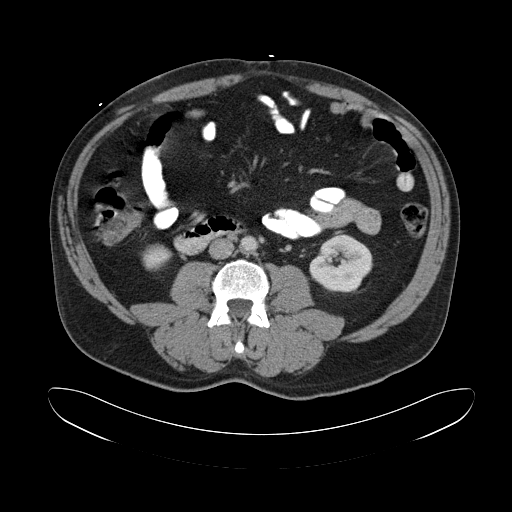
[im 61/106  soft-tissue]
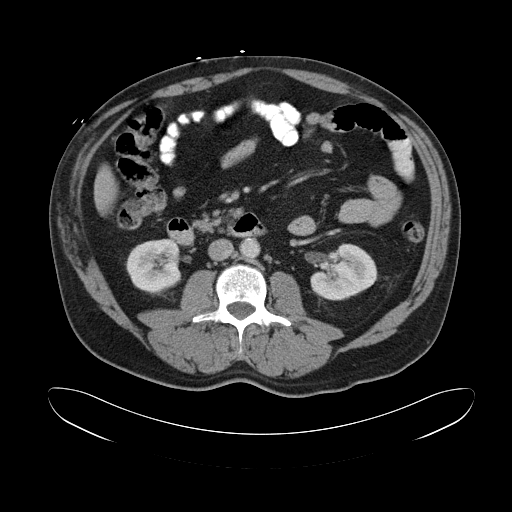
[im 67/106  soft-tissue]
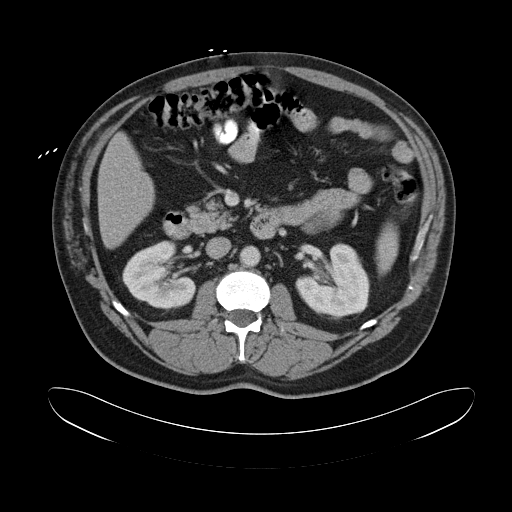
[im 67/106  bone]
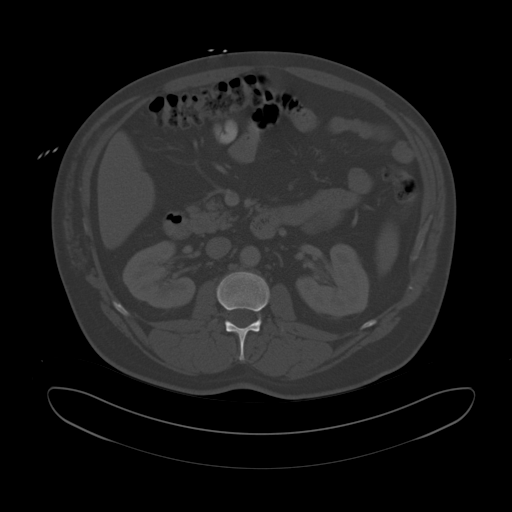
[im 78/106  soft-tissue]
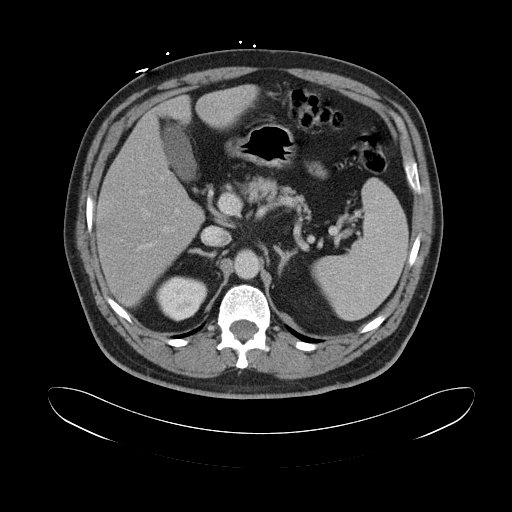
[im 83/106  soft-tissue]
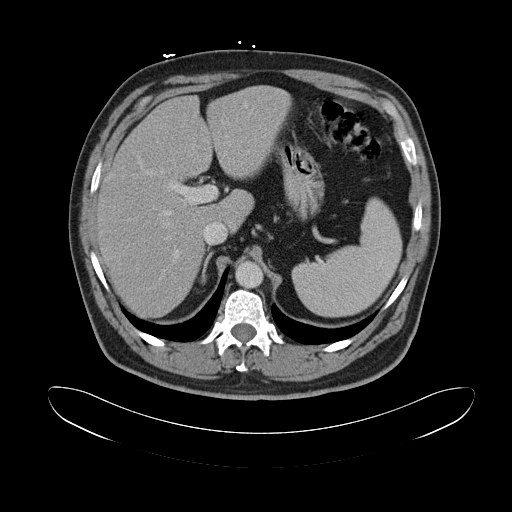
[im 83/106  lung]
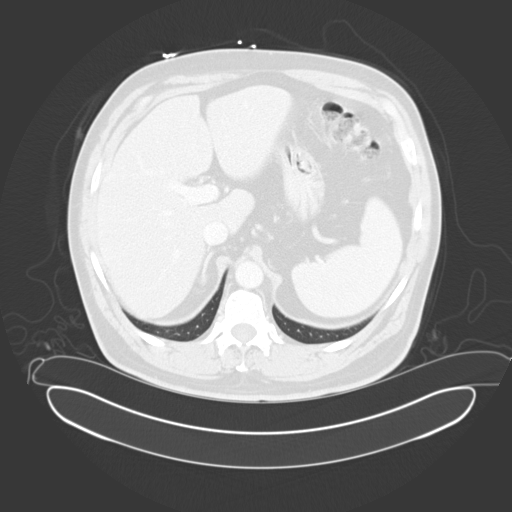
[im 89/106  soft-tissue]
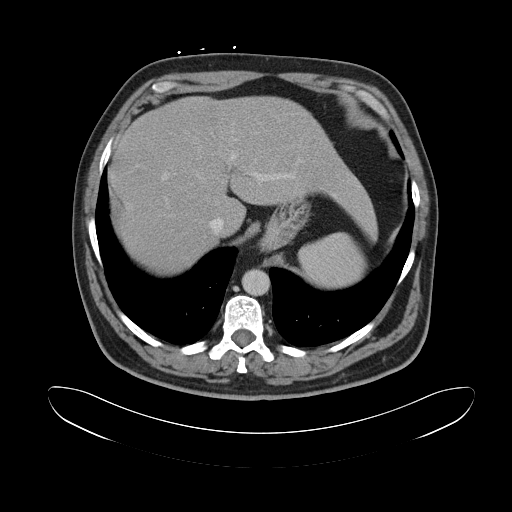
[im 89/106  lung]
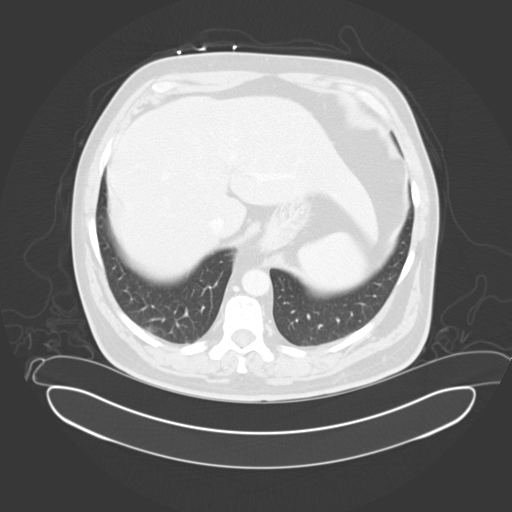
[im 94/106  lung]
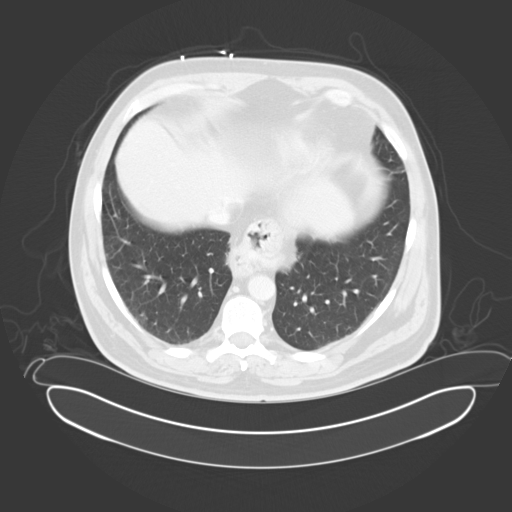
[im 100/106  soft-tissue]
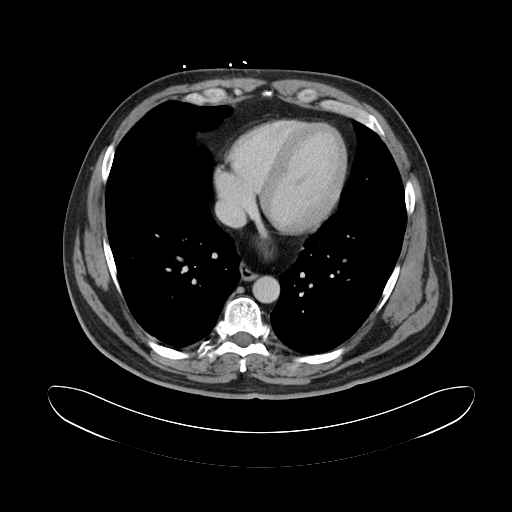
[im 100/106  lung]
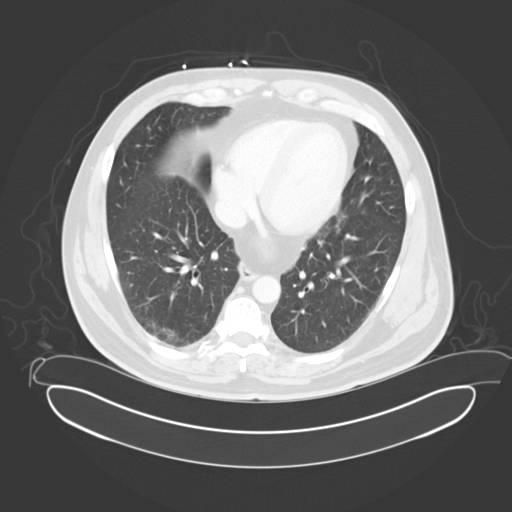

[14 of 32 positions shown; findings below may reference images not displayed]

FINDINGS: Small hiatal hernia. Heart is normal size. No confluent airspace
opacities or effusions in the lung bases.

Liver, gallbladder, spleen, pancreas, adrenals and kidneys are
unremarkable.

Descending colonic and sigmoid diverticulosis. No active
diverticulitis. Stomach and small bowel decompressed and
unremarkable. Urinary bladder is unremarkable.

There is a horseshoe shaped low-density fluid collection along the
posterior and lateral aspects of the lower rectum. This measures
approximately 4.2 x 3.4 cm on image 103 of series 2. This could
represent liquified hematoma or abscess.

No acute bony abnormality or focal bone lesion.
IMPRESSION: Horseshoe shaped fluid collection along lower rectum posteriorly and
laterally as described above. This could reflect liquified
hematoma/blood or abscess.

Left colonic diverticulosis.

Small hiatal hernia.

## 2015-02-04 SURGERY — INCISION AND DRAINAGE, ABSCESS, PERIRECTAL
Anesthesia: General | Wound class: Clean Contaminated

## 2015-02-04 MED ORDER — LIDOCAINE HCL (CARDIAC) 20 MG/ML IV SOLN
INTRAVENOUS | Status: DC | PRN
  Administered 2015-02-04: 40 mg via INTRAVENOUS

## 2015-02-04 MED ORDER — IPRATROPIUM-ALBUTEROL 0.5-2.5 (3) MG/3ML IN SOLN
3.0000 mL | Freq: Once | RESPIRATORY_TRACT | Status: AC
  Administered 2015-02-04: 3 mL via RESPIRATORY_TRACT

## 2015-02-04 MED ORDER — FENTANYL CITRATE (PF) 100 MCG/2ML IJ SOLN
INTRAMUSCULAR | Status: AC
  Filled 2015-02-04: qty 2

## 2015-02-04 MED ORDER — SODIUM CHLORIDE 0.9 % IV SOLN
INTRAVENOUS | Status: DC
  Administered 2015-02-05 (×3): via INTRAVENOUS

## 2015-02-04 MED ORDER — ONDANSETRON HCL 4 MG/2ML IJ SOLN: 4.0000 mg | Freq: Once | INTRAMUSCULAR | Status: DC | PRN

## 2015-02-04 MED ORDER — HYDROMORPHONE HCL 1 MG/ML IJ SOLN
1.0000 mg | Freq: Once | INTRAMUSCULAR | Status: AC
  Administered 2015-02-04: 1 mg via INTRAVENOUS
  Filled 2015-02-04: qty 1

## 2015-02-04 MED ORDER — GLYCOPYRROLATE 0.2 MG/ML IJ SOLN
INTRAMUSCULAR | Status: DC | PRN
  Administered 2015-02-04: 0.2 mg via INTRAVENOUS

## 2015-02-04 MED ORDER — ONDANSETRON HCL 4 MG/2ML IJ SOLN: 4.0000 mg | Freq: Four times a day (QID) | INTRAMUSCULAR | Status: DC | PRN

## 2015-02-04 MED ORDER — IOHEXOL 350 MG/ML SOLN
100.0000 mL | Freq: Once | INTRAVENOUS | Status: AC | PRN
  Administered 2015-02-04: 100 mL via INTRAVENOUS

## 2015-02-04 MED ORDER — ACETAMINOPHEN 325 MG PO TABS
650.0000 mg | ORAL_TABLET | Freq: Four times a day (QID) | ORAL | Status: DC | PRN
  Administered 2015-02-05: 650 mg via ORAL
  Filled 2015-02-04: qty 2

## 2015-02-04 MED ORDER — FENTANYL CITRATE (PF) 100 MCG/2ML IJ SOLN
INTRAMUSCULAR | Status: DC | PRN
  Administered 2015-02-04 (×3): 50 ug via INTRAVENOUS
  Administered 2015-02-04: 100 ug via INTRAVENOUS
  Administered 2015-02-04 (×2): 50 ug via INTRAVENOUS

## 2015-02-04 MED ORDER — NEOSTIGMINE METHYLSULFATE 10 MG/10ML IV SOLN
INTRAVENOUS | Status: DC | PRN
  Administered 2015-02-04: 1 mg via INTRAVENOUS

## 2015-02-04 MED ORDER — ONDANSETRON 4 MG PO TBDP: 4.0000 mg | ORAL_TABLET | Freq: Four times a day (QID) | ORAL | Status: DC | PRN

## 2015-02-04 MED ORDER — SUCCINYLCHOLINE CHLORIDE 20 MG/ML IJ SOLN
INTRAMUSCULAR | Status: DC | PRN
  Administered 2015-02-04: 100 mg via INTRAVENOUS

## 2015-02-04 MED ORDER — HYDROMORPHONE HCL 1 MG/ML IJ SOLN
0.5000 mg | INTRAMUSCULAR | Status: DC | PRN
  Administered 2015-02-05: 0.5 mg via INTRAVENOUS
  Administered 2015-02-05 (×2): 1 mg via INTRAVENOUS
  Filled 2015-02-04 (×3): qty 1

## 2015-02-04 MED ORDER — IPRATROPIUM-ALBUTEROL 0.5-2.5 (3) MG/3ML IN SOLN
RESPIRATORY_TRACT | Status: AC
  Filled 2015-02-04: qty 3

## 2015-02-04 MED ORDER — NICOTINE 21 MG/24HR TD PT24
MEDICATED_PATCH | TRANSDERMAL | Status: AC
  Administered 2015-02-04: 21 mg via TRANSDERMAL
  Filled 2015-02-04: qty 1

## 2015-02-04 MED ORDER — PANTOPRAZOLE SODIUM 40 MG IV SOLR
40.0000 mg | Freq: Every day | INTRAVENOUS | Status: DC
  Administered 2015-02-05 (×2): 40 mg via INTRAVENOUS
  Filled 2015-02-04 (×2): qty 40

## 2015-02-04 MED ORDER — OXYCODONE-ACETAMINOPHEN 5-325 MG PO TABS
1.0000 | ORAL_TABLET | ORAL | Status: DC | PRN
  Administered 2015-02-05: 1 via ORAL
  Administered 2015-02-05: 2 via ORAL
  Administered 2015-02-05: 1 via ORAL
  Administered 2015-02-06: 2 via ORAL
  Filled 2015-02-04: qty 2
  Filled 2015-02-04 (×2): qty 1
  Filled 2015-02-04: qty 2

## 2015-02-04 MED ORDER — ACETAMINOPHEN 650 MG RE SUPP: 650.0000 mg | Freq: Four times a day (QID) | RECTAL | Status: DC | PRN

## 2015-02-04 MED ORDER — PHENYLEPHRINE HCL 10 MG/ML IJ SOLN
INTRAMUSCULAR | Status: DC | PRN
Start: 2015-02-04 — End: 2015-02-04
  Administered 2015-02-04 (×3): 100 ug via INTRAVENOUS

## 2015-02-04 MED ORDER — LACTATED RINGERS IV SOLN
INTRAVENOUS | Status: DC | PRN
  Administered 2015-02-04: 21:00:00 via INTRAVENOUS

## 2015-02-04 MED ORDER — SODIUM CHLORIDE 0.9 % IV SOLN
3.0000 g | Freq: Four times a day (QID) | INTRAVENOUS | Status: DC
  Administered 2015-02-05 (×5): 3 g via INTRAVENOUS
  Filled 2015-02-04 (×7): qty 3

## 2015-02-04 MED ORDER — MIDAZOLAM HCL 2 MG/2ML IJ SOLN
INTRAMUSCULAR | Status: DC | PRN
  Administered 2015-02-04: 2 mg via INTRAVENOUS

## 2015-02-04 MED ORDER — NICOTINE 21 MG/24HR TD PT24
21.0000 mg | MEDICATED_PATCH | Freq: Once | TRANSDERMAL | Status: AC
  Administered 2015-02-04: 21 mg via TRANSDERMAL
  Filled 2015-02-04: qty 1

## 2015-02-04 MED ORDER — PIPERACILLIN-TAZOBACTAM 3.375 G IVPB
3.3750 g | Freq: Once | INTRAVENOUS | Status: AC
  Administered 2015-02-04: 3.375 g via INTRAVENOUS
  Filled 2015-02-04: qty 50

## 2015-02-04 MED ORDER — ROCURONIUM BROMIDE 100 MG/10ML IV SOLN
INTRAVENOUS | Status: DC | PRN
  Administered 2015-02-04: 5 mg via INTRAVENOUS

## 2015-02-04 MED ORDER — FENTANYL CITRATE (PF) 100 MCG/2ML IJ SOLN
25.0000 ug | INTRAMUSCULAR | Status: DC | PRN
  Administered 2015-02-04 (×4): 25 ug via INTRAVENOUS

## 2015-02-04 MED ORDER — IOHEXOL 240 MG/ML SOLN
25.0000 mL | INTRAMUSCULAR | Status: AC
  Administered 2015-02-04 (×2): 25 mL via ORAL

## 2015-02-04 MED ORDER — HYDROMORPHONE HCL 1 MG/ML IJ SOLN
1.0000 mg | Freq: Once | INTRAMUSCULAR | Status: AC
Start: 2015-02-04 — End: 2015-02-04
  Administered 2015-02-04: 1 mg via INTRAVENOUS
  Filled 2015-02-04: qty 1

## 2015-02-04 MED ORDER — PROPOFOL 10 MG/ML IV BOLUS
INTRAVENOUS | Status: DC | PRN
  Administered 2015-02-04 (×2): 100 mg via INTRAVENOUS

## 2015-02-04 SURGICAL SUPPLY — 28 items
BLADE SURG SZ11 CARB STEEL (BLADE) ×4 IMPLANT
BRIEF STRETCH MATERNITY 2XLG (MISCELLANEOUS) IMPLANT
CANISTER SUCT 1200ML W/VALVE (MISCELLANEOUS) ×4 IMPLANT
DRAIN PENROSE 1/4X12 LTX (DRAIN) IMPLANT
DRAIN PENROSE 5/8X12 LTX STRL (DRAIN) IMPLANT
DRAPE LAPAROTOMY 100X77 ABD (DRAPES) ×4 IMPLANT
DRAPE LEGGINS SURG 28X43 STRL (DRAPES) ×4 IMPLANT
DRAPE UNDER BUTTOCK W/FLU (DRAPES) ×8 IMPLANT
GAUZE IODOFORM PACK 1/2 7832 (GAUZE/BANDAGES/DRESSINGS) ×4 IMPLANT
GAUZE SPONGE 4X4 12PLY STRL (GAUZE/BANDAGES/DRESSINGS) IMPLANT
GLOVE BIO SURGEON STRL SZ7.5 (GLOVE) ×8 IMPLANT
GLOVE INDICATOR 8.0 STRL GRN (GLOVE) ×8 IMPLANT
GOWN STRL REUS W/ TWL LRG LVL3 (GOWN DISPOSABLE) ×4 IMPLANT
GOWN STRL REUS W/TWL LRG LVL3 (GOWN DISPOSABLE) ×4
KIT RM TURNOVER CYSTO AR (KITS) ×4 IMPLANT
LABEL OR SOLS (LABEL) ×4 IMPLANT
LOOP RED MAXI  1X406MM (MISCELLANEOUS) ×2
LOOP VESSEL MAXI 1X406 RED (MISCELLANEOUS) ×2 IMPLANT
NS IRRIG 500ML POUR BTL (IV SOLUTION) ×4 IMPLANT
PACK BASIN MINOR ARMC (MISCELLANEOUS) ×4 IMPLANT
PAD ABD DERMACEA PRESS 5X9 (GAUZE/BANDAGES/DRESSINGS) IMPLANT
PAD PREP 24X41 OB/GYN DISP (PERSONAL CARE ITEMS) ×4 IMPLANT
SLEEVE SCD COMPRESS THIGH MED (MISCELLANEOUS) ×4 IMPLANT
SPONGE LAP 18X18 5 PK (GAUZE/BANDAGES/DRESSINGS) ×4 IMPLANT
SUT SILK 2 0 (SUTURE) ×2
SUT SILK 2-0 30XBRD TIE 12 (SUTURE) ×2 IMPLANT
SWAB CULTURE AMIES ANAERIB BLU (MISCELLANEOUS) ×4 IMPLANT
SYR BULB IRRIG 60ML STRL (SYRINGE) ×4 IMPLANT

## 2015-02-04 NOTE — Transfer of Care (Signed)
Immediate Anesthesia Transfer of Care Note  Patient: Darren Allen  Procedure(s) Performed: Procedure(s): IRRIGATION AND DEBRIDEMENT PERIRECTAL ABSCESS (N/A) RECTAL EXAM UNDER ANESTHESIA  Patient Location: PACU  Anesthesia Type:General  Level of Consciousness: awake, alert , oriented and sedated  Airway & Oxygen Therapy: Patient Spontanous Breathing and Patient connected to face mask oxygen  Post-op Assessment: Report given to RN and Post -op Vital signs reviewed and stable  Post vital signs: Reviewed and stable  Last Vitals:  Filed Vitals:   02/04/15 1900 02/04/15 1930  BP: 109/68 118/84  Pulse: 94 93  Temp:    Resp: 17 18    Complications: No apparent anesthesia complications

## 2015-02-04 NOTE — ED Notes (Signed)
Surgeon at bedside.  

## 2015-02-04 NOTE — Op Note (Signed)
Preoperative diagnosis: Perirectal abscess Postoperative diagnosis: Perirectal abscess, fistula Procedure performed:  Anorectal exam under anesthesia Incision and drainage of perianal abscess Placement of loose seton  Anesthesia: GETA EBL: 15 ml Specimen: None  Indication for surgery: Darren Allen presents with 1 week of perianal pain and CT scan concerning for perirectal abscess.  He was brought to the OR for rectal exam under anesthesia and I and D of perianal abscess  Details of procedure: Informed consent was obtained.  Darren Allen was brought to the OR suite.  He was induced, ETT was placed and general anesthesia was administered.  He was placed in lithotomy.  His anus was prepped and draped in the standard fashion.  A timeout was then performed, correctly identifying patient name, operative site and procedure to be performed.  I placed a well lubed finger in his anus and felt nothing abnormal.  I then placed a hill ferguson retractor in his anus and noted a large posterior opening <1 cm from his anal verge with significant purulence.  I then placed a lacrimal duct probe in this opening and it extended posteriorly.  I made a counterincision posteriorly and found a large pocket of purulence.  I then opened the loculations.  I then placed a small vessel loop through this tract and tied both ends to itself with a 3-0 silk suture x 2.  I then irrigated the cavity and packed it with 1/4 inch iodoform gauze.  A sterile dressing was then placed over the wound.  The patient was then awoken, extubated and brought to the PACU.  There were no immediate complications.  Needle, sponge and instrument count were correct at the end of the procedure.

## 2015-02-04 NOTE — ED Notes (Signed)
ED MD made aware again that surgeon has still not been to see pt. RN and MD will continue to call. Pt has been updated.

## 2015-02-04 NOTE — Anesthesia Procedure Notes (Addendum)
Procedure Name: LMA Insertion Date/Time: 02/04/2015 9:15 PM Performed by: Vaughan Sine Pre-anesthesia Checklist: Patient identified, Emergency Drugs available, Suction available, Patient being monitored and Timeout performed Patient Re-evaluated:Patient Re-evaluated prior to inductionOxygen Delivery Method: Circle system utilized and Simple face mask Preoxygenation: Pre-oxygenation with 100% oxygen Intubation Type: IV induction LMA: LMA inserted LMA Size: 4.0 Number of attempts: 1 Airway Equipment and Method: Oral airway   Procedure Name: Intubation Date/Time: 02/04/2015 9:24 PM Performed by: Vaughan Sine Pre-anesthesia Checklist: Patient identified, Emergency Drugs available, Suction available, Patient being monitored and Timeout performed Patient Re-evaluated:Patient Re-evaluated prior to inductionOxygen Delivery Method: Circle system utilized and Simple face mask Preoxygenation: Pre-oxygenation with 100% oxygen Intubation Type: IV induction, Cricoid Pressure applied and Rapid sequence Ventilation: Mask ventilation without difficulty and Oral airway inserted - appropriate to patient size Laryngoscope Size: Miller and 3 Grade View: Grade II Tube type: Oral Tube size: 7.5 mm Number of attempts: 1 Airway Equipment and Method: Stylet Placement Confirmation: breath sounds checked- equal and bilateral,  CO2 detector,  positive ETCO2 and ETT inserted through vocal cords under direct vision Dental Injury: Teeth and Oropharynx as per pre-operative assessment

## 2015-02-04 NOTE — ED Notes (Signed)
Pt sent by [facility] for a rectal tear, pt reports episodes of rectal bleeding and pain

## 2015-02-04 NOTE — ED Notes (Signed)
ED MD made aware that surgeon still had not been to see pt. MD called ascom number.

## 2015-02-04 NOTE — Brief Op Note (Signed)
02/04/2015  10:17 PM  PATIENT:  Darren Allen  49 y.o. male  PRE-OPERATIVE DIAGNOSIS:  peri-rectal abscess  POST-OPERATIVE DIAGNOSIS:  peri-rectal abscess, internal opening  PROCEDURE:  Procedure(s): IRRIGATION AND DEBRIDEMENT PERIRECTAL ABSCESS (N/A) RECTAL EXAM UNDER ANESTHESIA  Placement of loose seton  SURGEON:  Surgeon(s) and Role:    * Marlyce Huge, MD - Primary  PHYSICIAN ASSISTANT:   ASSISTANTS: none   ANESTHESIA:   general  EBL:  Total I/O In: 600 [I.V.:600] Out: 87 [Blood:50]  BLOOD ADMINISTERED:none  DRAINS: Vessel loop between internal opening and external incision and drainage site  LOCAL MEDICATIONS USED:  NONE  SPECIMEN:  No Specimen  DISPOSITION OF SPECIMEN:  N/A  COUNTS:  YES  TOURNIQUET:  * No tourniquets in log *  DICTATION: .Note written in EPIC  PLAN OF CARE: Admit for overnight observation  PATIENT DISPOSITION:  PACU - hemodynamically stable.   Delay start of Pharmacological VTE agent (>24hrs) due to surgical blood loss or risk of bleeding: not applicable

## 2015-02-04 NOTE — ED Notes (Signed)
Patient transported to CT 

## 2015-02-04 NOTE — ED Provider Notes (Signed)
[facility] Emergency Department Provider Note     Time seen: ----------------------------------------- 10:50 AM on 02/04/2015 -----------------------------------------    I have reviewed the triage vital signs and the nursing notes.   HISTORY  Chief Complaint Rectal Pain    HPI Darren Allen is a 49 y.o. male who presents ER for rectal tear. Patient reports episodes rectal bleeding and pain.Patient has not had a history of same, still passing blood clots, was sent here from the urgent care for further evaluation. Patient was using rectal lidocaine without any improvement, denies fevers chills or other complaints.   Past Medical History  Diagnosis Date  . Diabetes mellitus without complication ([place])     type 2  . COPD (chronic obstructive pulmonary disease) ([place])   . Hypertension     There are no active problems to display for this patient.   Past Surgical History  Procedure Laterality Date  . Appendectomy      Allergies Review of patient's allergies indicates no known allergies.  Social History Social History  Substance Use Topics  . Smoking status: Current Every Day Smoker -- 1.00 packs/day for 34 years    Types: Cigarettes  . Smokeless tobacco: None  . Alcohol Use: 12.0 oz/week    20 Cans of beer per week     Comment: varies    Review of Systems Constitutional: Negative for fever. Eyes: Negative for visual changes. ENT: Negative for sore throat. Cardiovascular: Negative for chest pain. Respiratory: Negative for shortness of breath. Gastrointestinal: Negative for abdominal pain, vomiting and diarrhea. Positive for rectal pain and rectal bleeding Genitourinary: Negative for dysuria. Musculoskeletal: Negative for back pain. Skin: Negative for rash. Neurological: Negative for headaches, focal weakness or numbness.  10-point ROS otherwise negative.  ____________________________________________   PHYSICAL EXAM:  VITAL  SIGNS: ED Triage Vitals  Enc Vitals Group     BP 02/04/15 1034 167/85 mmHg     Pulse Rate 02/04/15 1034 108     Resp --      Temp 02/04/15 1034 98.4 F (36.9 C)     Temp src --      SpO2 02/04/15 1034 97 %     Weight 02/04/15 1034 242 lb (109.77 kg)     Height 02/04/15 1034 6\' 2"  (1.88 m)     Head Cir --      Peak Flow --      Pain Score 02/04/15 1036 7     Pain Loc --      Pain Edu? --      Excl. in [place]? --     Constitutional: Alert and oriented. Well appearing and in no distress. Eyes: Conjunctivae are normal. PERRL. Normal extraocular movements. ENT   Head: Normocephalic and atraumatic.   Nose: No congestion/rhinnorhea.   Mouth/Throat: Mucous membranes are moist.   Neck: No stridor. Cardiovascular: Normal rate, regular rhythm. Normal and symmetric distal pulses are present in all extremities. No murmurs, rubs, or gallops. Respiratory: Normal respiratory effort without tachypnea nor retractions. Breath sounds are clear and equal bilaterally. No wheezes/rales/rhonchi. Gastrointestinal: Soft and nontender. No distention. No abdominal bruits.  Rectal: Normal looking stool, heme positive. Severe tenderness on examination. No hemorrhoids palpable. Musculoskeletal: Nontender with normal range of motion in all extremities. No joint effusions.  No lower extremity tenderness nor edema. Neurologic:  Normal speech and language. No gross focal neurologic deficits are appreciated. Speech is normal. No gait instability. Skin:  Skin is warm, dry and intact. No rash noted. Psychiatric: Mood  and affect are normal. Speech and behavior are normal. Patient exhibits appropriate insight and judgment. ___________________________________________  ED COURSE:  Pertinent labs & imaging results that were available during my care of the patient were reviewed by me and considered in my medical decision making (see chart for details). Patient very uncomfortable, we'll obtain CT imaging of the  rectum. ____________________________________________    LABS (pertinent positives/negatives)  Labs Reviewed  CBC WITH DIFFERENTIAL/PLATELET - Abnormal; Notable for the following:    WBC 11.5 (*)    RDW 15.3 (*)    Neutro Abs 8.8 (*)    Monocytes Absolute 1.4 (*)    All other components within normal limits  COMPREHENSIVE METABOLIC PANEL - Abnormal; Notable for the following:    Glucose, Bld 234 (*)    Calcium 8.8 (*)    All other components within normal limits  URINALYSIS COMPLETEWITH MICROSCOPIC ([facility] ONLY) - Abnormal; Notable for the following:    Color, Urine YELLOW (*)    APPearance CLEAR (*)    Glucose, UA >500 (*)    All other components within normal limits    RADIOLOGY Images were viewed by me  CT pelvis  IMPRESSION: Horseshoe shaped fluid collection along lower rectum posteriorly and laterally as described above. This could reflect liquified hematoma/blood or abscess.  Left colonic diverticulosis.  Small hiatal hernia. ____________________________________________  FINAL ASSESSMENT AND PLAN  Rectal bleeding, perirectal abscess  Plan: Patient with labs and imaging as dictated above. I have discussed the case with general surgery on call who will come and evaluate the patient the ER. He'll be given IV Zosyn to cover for infection and IV Dilaudid for pain. Currently he is hemodynamically stable. She may need general anesthesia and surgery to drain this area. Also discussed with gastroenterology.   Earleen Newport, MD   Earleen Newport, MD 02/04/15 [phone]

## 2015-02-04 NOTE — Anesthesia Preprocedure Evaluation (Addendum)
Anesthesia Evaluation  Patient identified by MRN, date of birth, ID band Patient awake    Reviewed: Allergy & Precautions, NPO status , Patient's Chart, lab work & pertinent test results, reviewed documented beta blocker date and time   Airway Mallampati: II  TM Distance: >3 FB     Dental  (+) Chipped, Poor Dentition, Dental Advisory Given, Upper Dentures   Pulmonary sleep apnea , COPD, Current Smoker,           Cardiovascular hypertension, Pt. on medications      Neuro/Psych    GI/Hepatic GERD  Medicated and Controlled,  Endo/Other  diabetes  Renal/GU Renal InsufficiencyRenal disease     Musculoskeletal   Abdominal   Peds  Hematology   Anesthesia Other Findings Does not use CPAP. Never been tested for sleep apnea. Deenies stopping breathing at [employer]Denies reflux. Prilosec controls. NPO. Will proceed with LMA.  Reproductive/Obstetrics                           Anesthesia Physical Anesthesia Plan  ASA: II  Anesthesia Plan: General   Post-op Pain Management:    Induction: Intravenous  Airway Management Planned: Oral ETT and LMA  Additional Equipment:   Intra-op Plan:   Post-operative Plan:   Informed Consent: I have reviewed the patients History and Physical, chart, labs and discussed the procedure including the risks, benefits and alternatives for the proposed anesthesia with the patient or authorized representative who has indicated his/her understanding and acceptance.     Plan Discussed with: CRNA  Anesthesia Plan Comments:         Anesthesia Quick Evaluation

## 2015-02-04 NOTE — ED Notes (Signed)
MD at bedside, pt states he went to urgent care and was diagnoised with a rectal tear, now states rectal pain and rectal bleeding

## 2015-02-04 NOTE — H&P (Signed)
CC: Perirectal pain, bleeding  HPI: Darren Allen is a pleasant 49 yo M who presents with approx 1 week of anal pain.  He says that it began acutely has worsened.  Began having BRBPR the past 2 days.  Presented to Urgent Care a few days ago and was diagnosed with a rectal tear and given anal lidocaine.  Feels pressure with defecation.  Otherwise no fevers/chills, night sweats, shortness of breath, cough, chest pain, abdominal pain, nausea/vomiting, diarrhea/constipation, dysuria/hematuria.    Active Ambulatory Problems    Diagnosis Date Noted  . Chronic obstructive pulmonary disease ([place]) 01/17/2013  . Type 2 diabetes mellitus ([place]) 02/04/2015  . Acid reflux 01/17/2013  . HLD (hyperlipidemia) 01/17/2013  . 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 Adiposity 07/06/2011  . Apnea, sleep 01/17/2013  . Testicular hypofunction 01/17/2013  . Allergic state 01/17/2013   Resolved Ambulatory Problems    Diagnosis Date Noted  . No Resolved Ambulatory Problems   Past Medical History  Diagnosis Date  . Diabetes mellitus without complication ([place])   . COPD (chronic obstructive pulmonary disease) ([place])   . Hypertension    Past Surgical History  Procedure Laterality Date  . Appendectomy       Medication List    ASK your doctor about these medications        budesonide-formoterol 80-4.5 MCG/ACT inhaler  Commonly known as:  SYMBICORT  Inhale 2 puffs into the lungs 2 (two) times daily.     ciprofloxacin 500 MG tablet  Commonly known as:  CIPRO  Take 500 mg by mouth 2 (two) times daily. For 10 days     HYDROcodone-acetaminophen 5-325 MG tablet  Commonly known as:  NORCO/VICODIN  Take 1 tablet by mouth every 4 (four) hours as needed for moderate pain.     lidocaine 2 % jelly  Commonly known as:  XYLOCAINE  Apply 1 application topically 2 (two) times daily as needed (for pain).     lisinopril 10 MG tablet  Commonly known as:  PRINIVIL,ZESTRIL  Take 10 mg by mouth daily.      omeprazole 20 MG tablet  Commonly known as:  PRILOSEC OTC  Take 20 mg by mouth 2 (two) times daily.     Testosterone Cypionate 200 MG/ML Kit  Inject 200 mg into the muscle every 14 (fourteen) days.     tiotropium 18 MCG inhalation capsule  Commonly known as:  SPIRIVA  Place 18 mcg into inhaler and inhale daily.      No Known Allergies   No family history on file.   Social History   Social History  . Marital Status: Single    Spouse Name: N/A  . Number of Children: N/A  . Years of Education: N/A   Occupational History  . Not on file.   Social History Main Topics  . Smoking status: Current Every Day Smoker -- 1.00 packs/day for 34 years    Types: Cigarettes  . Smokeless tobacco: Not on file  . Alcohol Use: 12.0 oz/week    20 Cans of beer per week     Comment: varies  . Drug Use: No  . Sexual Activity: Not on file   Other Topics Concern  . Not on file   Social History Narrative   ROS: Full ROS obtained, pertinent positives and negatives  Blood pressure 118/84, pulse 93, temperature 98.4 F (36.9 C), resp. rate 18, height $RemoveBe'6\' 2"'czhnrqAQd$  (1.88 m), weight 242 lb (109.77 kg), SpO2 92 %. GEN: NAD/A&Ox3  FACE: no obvious facial trauma, normal external nose, normal external ears EYES: no scleral icterus, no conjunctivitis HEAD: normocephalic atraumatic CV: RRR, no MRG RESP: moving air well, lungs clear ABD: soft, nontender, nondistended ANUS: Deferred due to pain, some fluctuance posteriorly around anus EXT: moving all ext well, strength 5/5 NEURO: cnII-XII grossly intact, sensation intact all 4 ext  Labs: Personally reviewed, significant for WBC 11.5  CT: + for perirectal fluid, unable to see bottom extent of fluid collection  A/P 49 yo M with likely perirectal abscess, unable to obtain exam due to pain. - to OR for rectal exam under anesthesia, possible I and D of perirectal abscess.  I have explained the procedure in detail, its risks and benefits including fistula.   I have also explained that he may require colorectal surgeon if the abscess is to high for drainage or requires complicated drainage procedure.  He agrees with this plan.

## 2015-02-05 LAB — GLUCOSE, CAPILLARY
GLUCOSE-CAPILLARY: 204 mg/dL — AB (ref 65–99)
Glucose-Capillary: 156 mg/dL — ABNORMAL HIGH (ref 65–99)
Glucose-Capillary: 181 mg/dL — ABNORMAL HIGH (ref 65–99)
Glucose-Capillary: 202 mg/dL — ABNORMAL HIGH (ref 65–99)

## 2015-02-05 MED ORDER — NICOTINE 21 MG/24HR TD PT24
21.0000 mg | MEDICATED_PATCH | Freq: Every day | TRANSDERMAL | Status: DC
  Administered 2015-02-05: 21 mg via TRANSDERMAL

## 2015-02-05 MED ORDER — BUDESONIDE-FORMOTEROL FUMARATE 80-4.5 MCG/ACT IN AERO
2.0000 | INHALATION_SPRAY | Freq: Two times a day (BID) | RESPIRATORY_TRACT | Status: DC
  Administered 2015-02-05 (×2): 2 via RESPIRATORY_TRACT
  Filled 2015-02-05: qty 6.9

## 2015-02-05 MED ORDER — IPRATROPIUM-ALBUTEROL 0.5-2.5 (3) MG/3ML IN SOLN
3.0000 mL | Freq: Four times a day (QID) | RESPIRATORY_TRACT | Status: DC
  Administered 2015-02-05 – 2015-02-06 (×6): 3 mL via RESPIRATORY_TRACT
  Filled 2015-02-05 (×6): qty 3

## 2015-02-05 MED ORDER — LISINOPRIL 10 MG PO TABS
10.0000 mg | ORAL_TABLET | Freq: Every day | ORAL | Status: DC
  Administered 2015-02-05: 10 mg via ORAL
  Filled 2015-02-05: qty 1

## 2015-02-05 MED ORDER — TIOTROPIUM BROMIDE MONOHYDRATE 18 MCG IN CAPS
18.0000 ug | ORAL_CAPSULE | Freq: Every day | RESPIRATORY_TRACT | Status: DC
  Administered 2015-02-05: 18 ug via RESPIRATORY_TRACT
  Filled 2015-02-05: qty 5

## 2015-02-05 NOTE — Anesthesia Postprocedure Evaluation (Signed)
Anesthesia Post Note  Patient: Darren Allen  Procedure(s) Performed: Procedure(s) (LRB): IRRIGATION AND DEBRIDEMENT PERIRECTAL ABSCESS (N/A) RECTAL EXAM UNDER ANESTHESIA  Patient location during evaluation: PACU Anesthesia Type: General Level of consciousness: awake and alert Pain management: pain level controlled Vital Signs Assessment: post-procedure vital signs reviewed and stable Respiratory status: spontaneous breathing Cardiovascular status: stable Anesthetic complications: no    Last Vitals:  Filed Vitals:   02/05/15 0457 02/05/15 1404  BP: 94/55 107/60  Pulse: 104 99  Temp: 36.9 C 37 C  Resp: 18 16    Last Pain:  Filed Vitals:   02/05/15 1549  PainSc: [place]

## 2015-02-05 NOTE — Progress Notes (Signed)
Called Dr. Rexene Edison regarding patient request for nebulizer treatment prn.  Doctor put in order.  Darren Allen 02/05/2015  2:34 AM

## 2015-02-05 NOTE — Progress Notes (Signed)
Evaluated Darren Allen for tachycardia/fever.  EKG shows sinus tachycardia.  Pain much improved.  Will continue IV abx and treat fever.

## 2015-02-05 NOTE — Progress Notes (Signed)
Inpatient Diabetes Program Recommendations  AACE/ADA: New Consensus Statement on Inpatient Glycemic Control (2015)  Target Ranges:  Prepandial:   less than 140 mg/dL      Peak postprandial:   less than 180 mg/dL (1-2 hours)      Critically ill patients:  140 - 180 mg/dL   Review of Glycemic Control:  Results for Darren Allen, Darren Allen (MRN [id number]) as of 02/05/2015 09:15  Ref. Range 04/03/2010 12:48 04/03/2010 16:16 02/04/2015 20:42 02/04/2015 22:13 02/05/2015 07:31  Glucose-Capillary Latest Ref Range: 65-99 mg/dL 164 (H) 217 (H) 114 (H) 131 (H) 204 (H)   Diabetes history: Type 2 diabetes Outpatient Diabetes medications: None listed Current orders for Inpatient glycemic control: None  Inpatient Diabetes Program Recommendations:   Note history of diabetes.  Please check A1C and consider adding Novolog moderate correction tid with meals and HS.  Will discuss with RN.    Adah Perl, RN, BC-ADM Inpatient Diabetes Coordinator Pager [phone] (8a-5p)

## 2015-02-05 NOTE — Progress Notes (Signed)
Surgery Progress Note  S: Sore but feels much better.  HR improved.  Afebrile O:Blood pressure 107/60, pulse 99, temperature 98.6 F (37 C), temperature source Oral, resp. rate 16, height 6\' 2"  (1.88 m), weight 239 lb (108.41 kg), SpO2 95 %. GEN: NAD/A&Ox3 ANUS: Min induration/erythema, vessel loop in place  A/P 49 yo s/p I and D of perianal abscess with seton placement, doing well - IV abx for tonight - PO pain meds - if afebrile overnight then PO abx tomorrow

## 2015-02-05 NOTE — Progress Notes (Signed)
Patient's fever decreased to WDL (98.6) an hour after tylenol was given.  Heart rate also decreased to 104 and blood pressure dropped down to 94/55.  Darren Allen  02/05/2015 5:52 AM

## 2015-02-05 NOTE — Progress Notes (Signed)
Called Dr. Rexene Edison regarding patient's heart rate of 142 and temperature of 102.6.  Given orders for tylenol and EKG.  Christene Slates  02/05/2015  2:36 AM

## 2015-02-06 LAB — GLUCOSE, CAPILLARY: GLUCOSE-CAPILLARY: 195 mg/dL — AB (ref 65–99)

## 2015-02-06 MED ORDER — AMOXICILLIN-POT CLAVULANATE 875-125 MG PO TABS: 1.0000 | ORAL_TABLET | Freq: Two times a day (BID) | ORAL | Status: DC

## 2015-02-06 MED ORDER — AMOXICILLIN-POT CLAVULANATE 875-125 MG PO TABS
1.0000 | ORAL_TABLET | Freq: Two times a day (BID) | ORAL | Status: DC
  Administered 2015-02-06: 1 via ORAL
  Filled 2015-02-06: qty 1

## 2015-02-06 MED ORDER — OXYCODONE-ACETAMINOPHEN 5-325 MG PO TABS: 1.0000 | ORAL_TABLET | ORAL | Status: DC | PRN

## 2015-02-06 NOTE — Discharge Summary (Signed)
Physician Discharge Summary  Patient ID: Darren Allen MRN: [id number] DOB/AGE: 51-May-1967 51 y.o.  Admit date: 02/04/2015 Discharge date: 02/06/2015  Admission Diagnoses:  Discharge Diagnoses:  Active Problems:   Perirectal abscess   Perianal abscess   Discharged Condition: good  Hospital Course: Darren Allen was admitted for management of perirectal abscess.  He was brought to the OR for I and D and seton placement.  Postoperatively, he had multiple fevers of up to 103.  He was continued on IV antibiotics until afebrile x 24 hours, then converted to oral.  At time of discharge, his pain was controlled with oral pain medications and was tolerating oral antibiotics.  Consults: None  Significant Diagnostic Studies: radiology: CT scan: Perirectal abscess  Treatments: surgery: Incision and drainage of perirectal abscess with seton placement  Discharge Exam: Blood pressure 131/53, pulse 99, temperature 98.2 F (36.8 C), temperature source Oral, resp. rate 18, height $RemoveBe'6\' 2"'cvDIhSqbp$  (1.88 m), weight 239 lb (108.41 kg), SpO2 96 %. GEN: NAD/A&Ox3 ANUS: Seton in place, no bleeding, purulence, erythema  Disposition: 01-Home or Self Care     Medication List    ASK your doctor about these medications        budesonide-formoterol 80-4.5 MCG/ACT inhaler  Commonly known as:  SYMBICORT  Inhale 2 puffs into the lungs 2 (two) times daily.     ciprofloxacin 500 MG tablet  Commonly known as:  CIPRO  Take 500 mg by mouth 2 (two) times daily. For 10 days     HYDROcodone-acetaminophen 5-325 MG tablet  Commonly known as:  NORCO/VICODIN  Take 1 tablet by mouth every 4 (four) hours as needed for moderate pain.     lidocaine 2 % jelly  Commonly known as:  XYLOCAINE  Apply 1 application topically 2 (two) times daily as needed (for pain).     lisinopril 10 MG tablet  Commonly known as:  PRINIVIL,ZESTRIL  Take 10 mg by mouth daily.     omeprazole 20 MG tablet  Commonly known as:  PRILOSEC OTC   Take 20 mg by mouth 2 (two) times daily.     Testosterone Cypionate 200 MG/ML Kit  Inject 200 mg into the muscle every 14 (fourteen) days.     tiotropium 18 MCG inhalation capsule  Commonly known as:  SPIRIVA  Place 18 mcg into inhaler and inhale daily.           Follow-up Information    Follow up with [facility]. Schedule an appointment as soon as possible for a visit in 1 week.      SignedMarlyce Huge 02/06/2015, 3:06 AM

## 2015-02-06 NOTE — Discharge Instructions (Signed)
Do not drive on pain medications Call or return to ER if you develop fever greater than 101.5, nausea/vomiting, increased pain, redness/drainage from incisions Okay to shower

## 2015-02-06 NOTE — Progress Notes (Signed)
IV was removed. Discharge instructions, follow-up appointments, and prescriptions were provided to the pt. The pt was taken downstairs via wheelchair by volunteer services. Work note was also given.

## 2015-02-06 NOTE — Progress Notes (Signed)
Surgery Progress Note  S:  Min pain.  Tolerated first dose of augmentin O:Blood pressure 125/64, pulse 99, temperature 98 F (36.7 C), temperature source Oral, resp. rate 18, height 6\' 2"  (1.88 m), weight 239 lb (108.41 kg), SpO2 100 %. GEN: NAD/A&Ox3 ANUS: Seton in place  A/P 49 yo s/p I and D of perirectal abscess, seton placement - d/c to home

## 2015-02-11 ENCOUNTER — Emergency Department
Admission: EM | Admit: 2015-02-11 | Discharge: 2015-02-11 | Disposition: A | Discharge status: Home / Self Care | Payer: Managed Care, Other (non HMO) | Attending: Emergency Medicine | Admitting: Emergency Medicine

## 2015-02-11 ENCOUNTER — Emergency Department: Payer: Managed Care, Other (non HMO)

## 2015-02-11 ENCOUNTER — Encounter: Payer: Self-pay | Admitting: Medical Oncology

## 2015-02-11 DIAGNOSIS — E119 Type 2 diabetes mellitus without complications: Secondary | ICD-10-CM | POA: Insufficient documentation

## 2015-02-11 DIAGNOSIS — J441 Chronic obstructive pulmonary disease with (acute) exacerbation: Secondary | ICD-10-CM

## 2015-02-11 DIAGNOSIS — Z792 Long term (current) use of antibiotics: Secondary | ICD-10-CM | POA: Insufficient documentation

## 2015-02-11 DIAGNOSIS — F17210 Nicotine dependence, cigarettes, uncomplicated: Secondary | ICD-10-CM | POA: Diagnosis not present

## 2015-02-11 DIAGNOSIS — I10 Essential (primary) hypertension: Secondary | ICD-10-CM | POA: Diagnosis not present

## 2015-02-11 DIAGNOSIS — Z79899 Other long term (current) drug therapy: Secondary | ICD-10-CM | POA: Diagnosis not present

## 2015-02-11 DIAGNOSIS — R05 Cough: Secondary | ICD-10-CM | POA: Diagnosis present

## 2015-02-11 DIAGNOSIS — Z7951 Long term (current) use of inhaled steroids: Secondary | ICD-10-CM | POA: Diagnosis not present

## 2015-02-11 DIAGNOSIS — J189 Pneumonia, unspecified organism: Secondary | ICD-10-CM

## 2015-02-11 DIAGNOSIS — R112 Nausea with vomiting, unspecified: Secondary | ICD-10-CM | POA: Insufficient documentation

## 2015-02-11 DIAGNOSIS — J159 Unspecified bacterial pneumonia: Secondary | ICD-10-CM | POA: Diagnosis not present

## 2015-02-11 LAB — CBC WITH DIFFERENTIAL/PLATELET
BASOS ABS: 0 10*3/uL (ref 0–0.1)
BASOS PCT: 1 %
Eosinophils Absolute: 0.2 10*3/uL (ref 0–0.7)
Eosinophils Relative: 3 %
HEMATOCRIT: 45.1 % (ref 40.0–52.0)
HEMOGLOBIN: 14.4 g/dL (ref 13.0–18.0)
Lymphocytes Relative: 15 %
Lymphs Abs: 1.2 10*3/uL (ref 1.0–3.6)
MCH: 26.3 pg (ref 26.0–34.0)
MCHC: 32 g/dL (ref 32.0–36.0)
MCV: 82.2 fL (ref 80.0–100.0)
Monocytes Absolute: 0.8 10*3/uL (ref 0.2–1.0)
Monocytes Relative: 10 %
NEUTROS ABS: 5.9 10*3/uL (ref 1.4–6.5)
NEUTROS PCT: 71 %
Platelets: 351 10*3/uL (ref 150–440)
RBC: 5.49 MIL/uL (ref 4.40–5.90)
RDW: 15.2 % — AB (ref 11.5–14.5)
WBC: 8.2 10*3/uL (ref 3.8–10.6)

## 2015-02-11 LAB — BASIC METABOLIC PANEL
ANION GAP: 8 (ref 5–15)
BUN: 11 mg/dL (ref 6–20)
CALCIUM: 9 mg/dL (ref 8.9–10.3)
CHLORIDE: 103 mmol/L (ref 101–111)
CO2: 29 mmol/L (ref 22–32)
Creatinine, Ser: 0.91 mg/dL (ref 0.61–1.24)
GFR calc non Af Amer: >60 mL/min (ref >60)
Glucose, Bld: 172 mg/dL — ABNORMAL HIGH (ref 65–99)
Potassium: 4.1 mmol/L (ref 3.5–5.1)
Sodium: 140 mmol/L (ref 135–145)

## 2015-02-11 LAB — BRAIN NATRIURETIC PEPTIDE: B NATRIURETIC PEPTIDE 5: 23 pg/mL (ref 0.0–100.0)

## 2015-02-11 LAB — TROPONIN I: Troponin I: <0.03 ng/mL (ref <0.031)

## 2015-02-11 IMAGING — CR DG CHEST 2V
1 series · 2 of 2 positions shown · non-contrast
Comparison: [DATE]

Correlation CT chest [DATE]

CLINICAL DATA: Cough, congestion and vomiting beginning [REDACTED],
fever of 100 degrees yesterday, rectal surgery last week, on
antibiotics, personal history of diabetes mellitus, COPD,
hypertension, smoking

EXAM:
CHEST  2 VIEW

[Series 1: dg chest 2 view · 0.14mm/px · 2 of 2 slices shown]
[im 1/2]
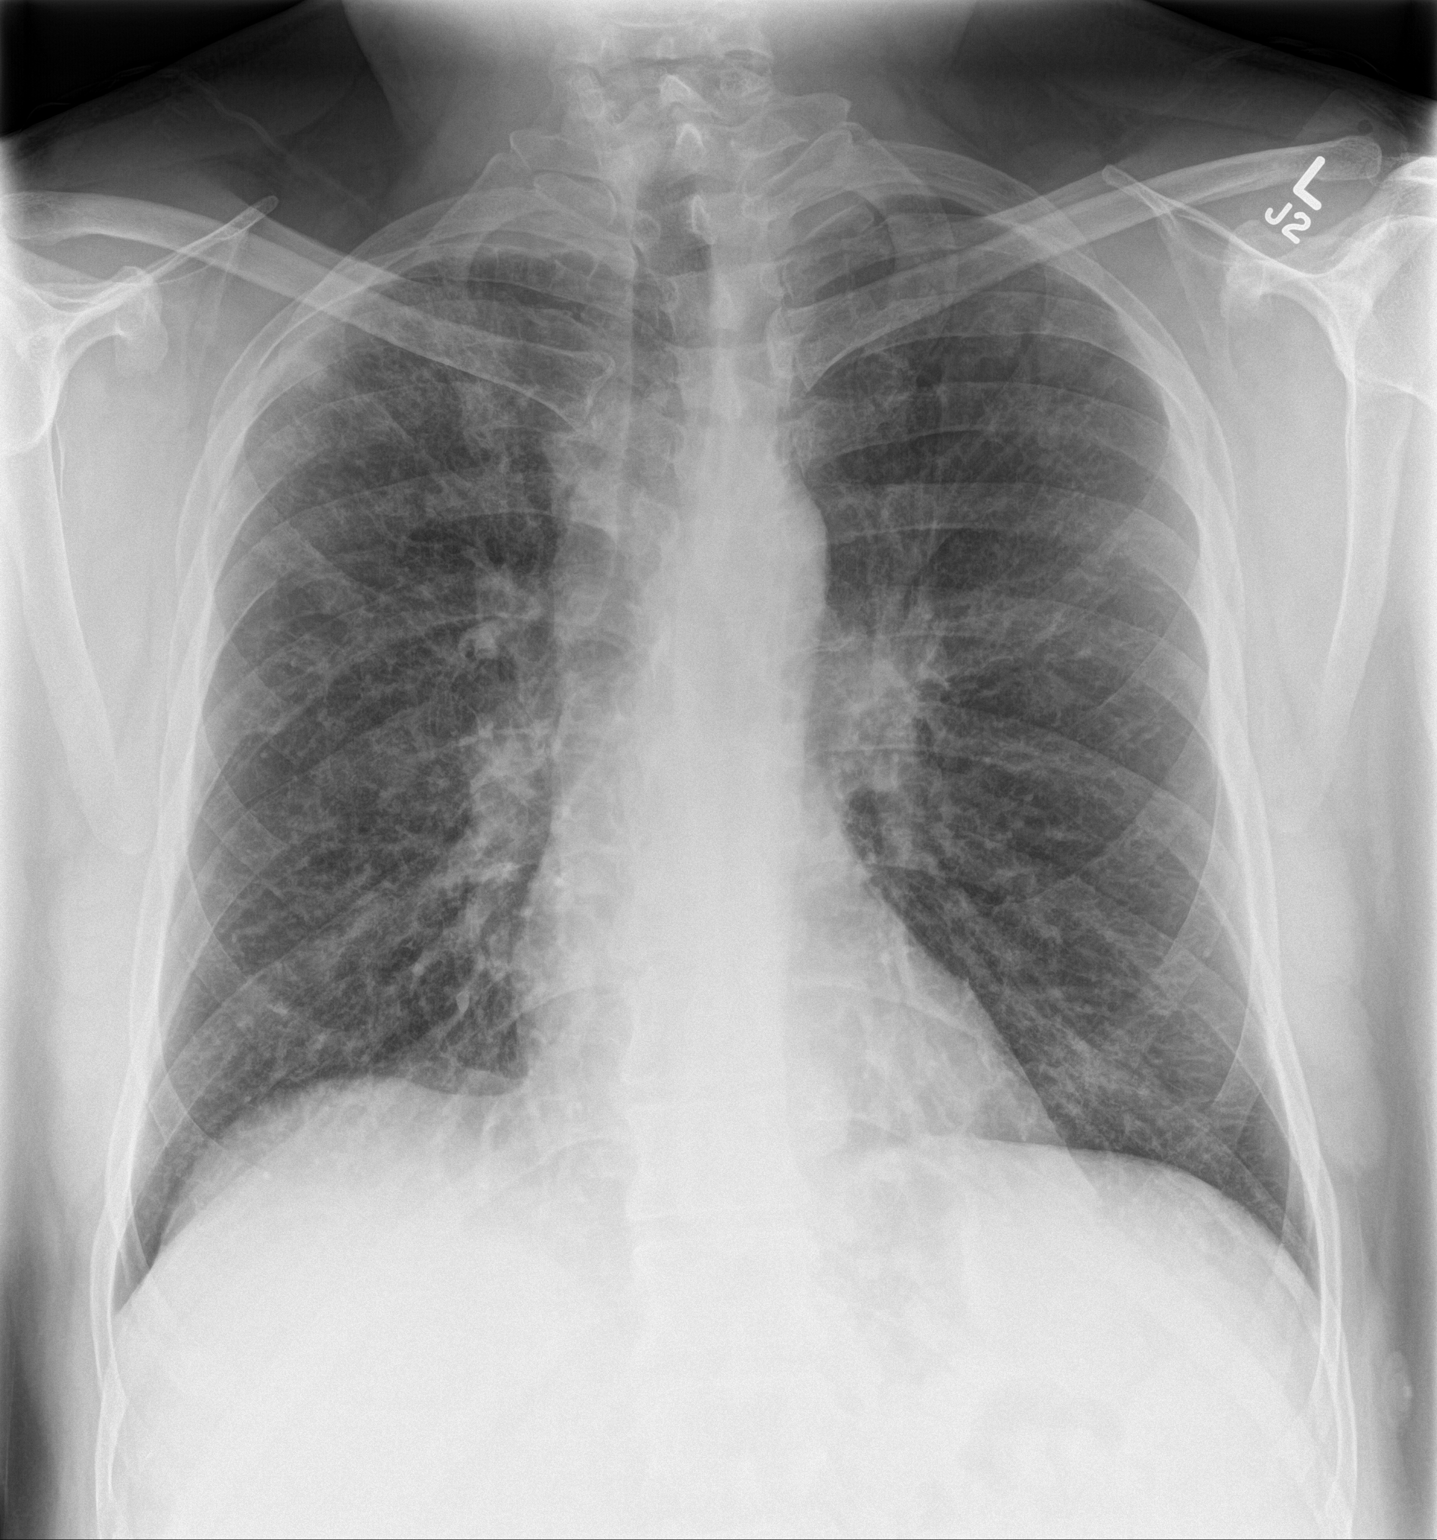
[im 2/2]
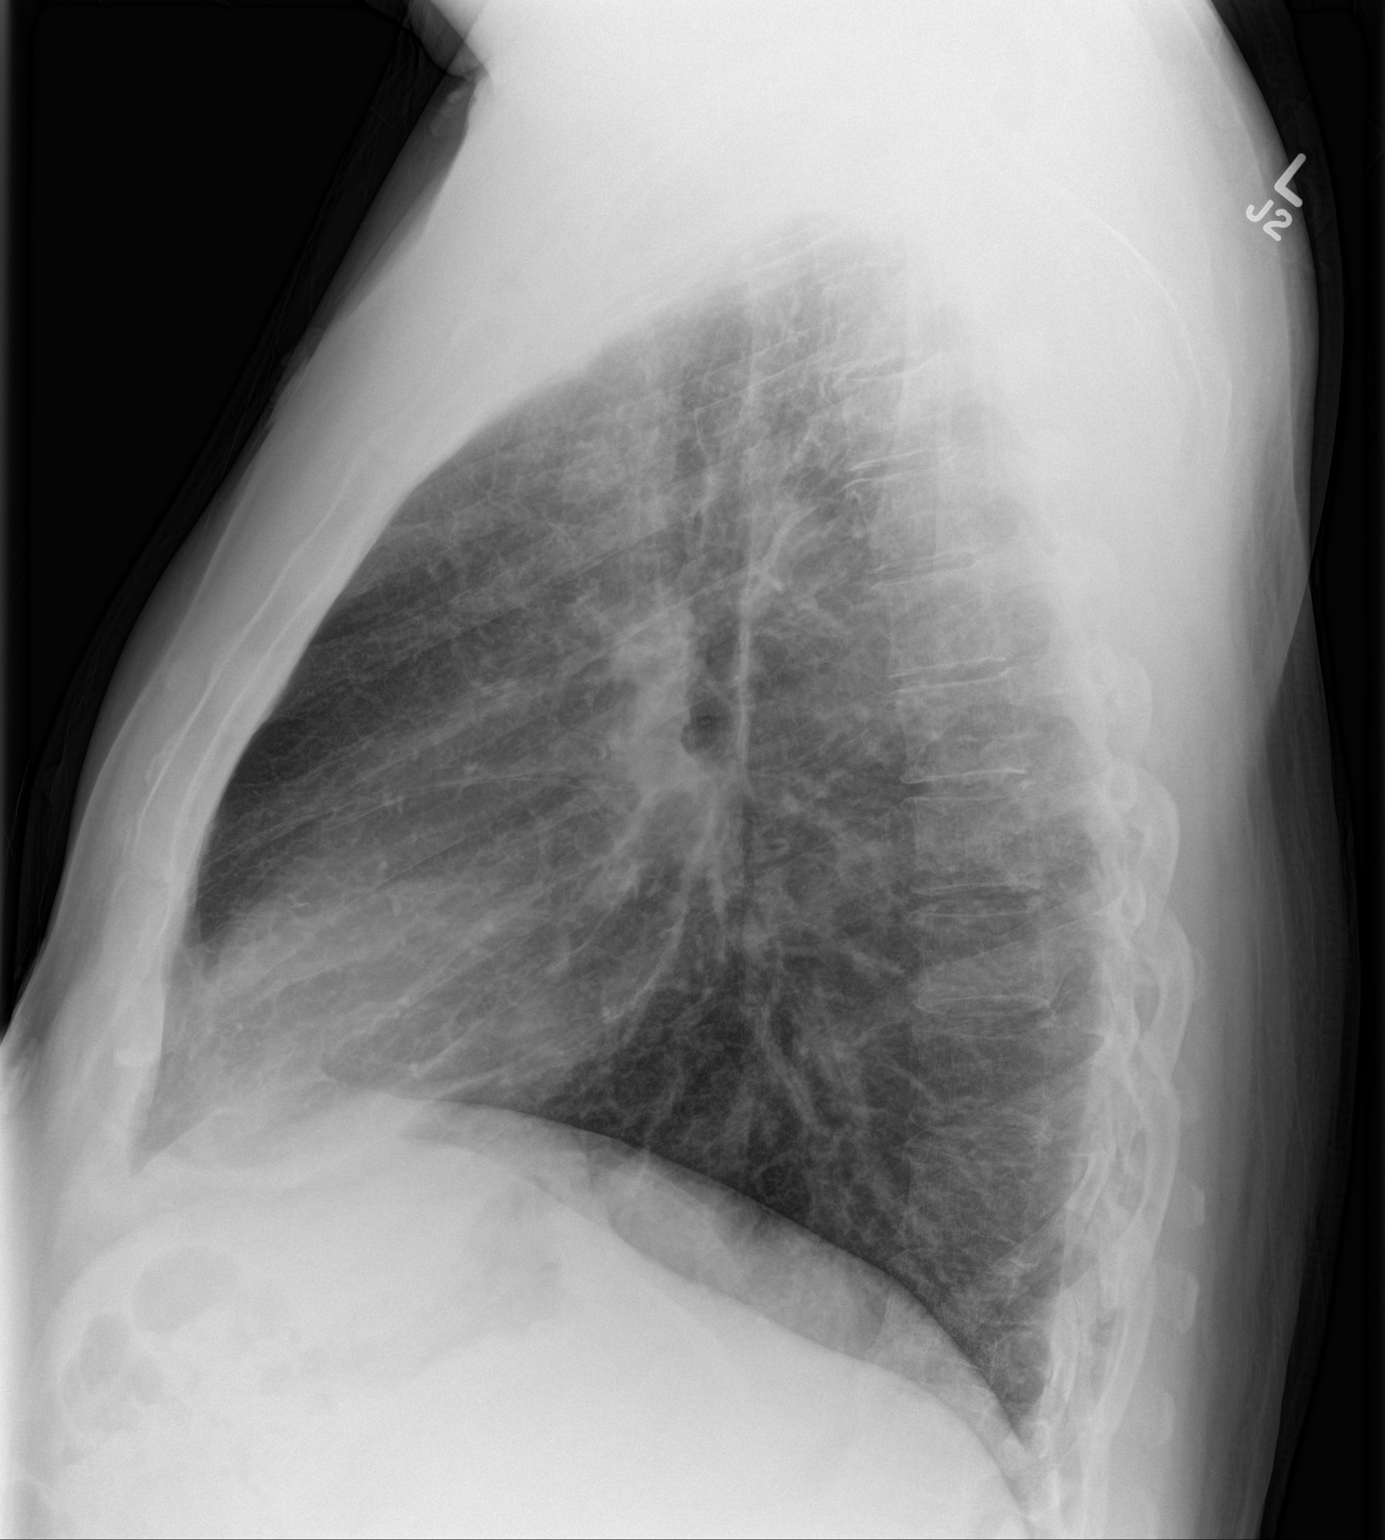

[2 of 2 positions shown; findings below may reference images not displayed]

FINDINGS: Normal heart size and pulmonary vascularity.

Paraspinal density inferior LEFT hemi thorax corresponding to small
hiatal hernia on prior CT.

Scattered interstitial infiltrates throughout both lungs, question
atypical infection less likely edema.

No segmental consolidation, pleural effusion or pneumothorax.

Bones unremarkable.
IMPRESSION: BILATERAL diffuse interstitial infiltrates question atypical
infection versus edema.

Small hiatal hernia.

## 2015-02-11 MED ORDER — ALBUTEROL SULFATE (2.5 MG/3ML) 0.083% IN NEBU
7.5000 mg | INHALATION_SOLUTION | Freq: Once | RESPIRATORY_TRACT | Status: AC
  Administered 2015-02-11: 7.5 mg via RESPIRATORY_TRACT
  Filled 2015-02-11: qty 9

## 2015-02-11 MED ORDER — AZITHROMYCIN 250 MG PO TABS: 250.0000 mg | ORAL_TABLET | Freq: Every day | ORAL | Status: AC

## 2015-02-11 MED ORDER — IPRATROPIUM-ALBUTEROL 0.5-2.5 (3) MG/3ML IN SOLN
9.0000 mL | Freq: Once | RESPIRATORY_TRACT | Status: AC
Start: 2015-02-11 — End: 2015-02-11
  Administered 2015-02-11: 9 mL via RESPIRATORY_TRACT
  Filled 2015-02-11: qty 9

## 2015-02-11 MED ORDER — PREDNISONE 20 MG PO TABS: 60.0000 mg | ORAL_TABLET | Freq: Every day | ORAL | Status: DC

## 2015-02-11 MED ORDER — METHYLPREDNISOLONE SODIUM SUCC 125 MG IJ SOLR
125.0000 mg | Freq: Once | INTRAMUSCULAR | Status: AC
  Administered 2015-02-11: 125 mg via INTRAVENOUS
  Filled 2015-02-11: qty 2

## 2015-02-11 MED ORDER — GI COCKTAIL ~~LOC~~
30.0000 mL | Freq: Once | ORAL | Status: AC
  Administered 2015-02-11: 30 mL via ORAL
  Filled 2015-02-11: qty 30

## 2015-02-11 MED ORDER — AZITHROMYCIN 250 MG PO TABS
500.0000 mg | ORAL_TABLET | Freq: Once | ORAL | Status: AC
  Administered 2015-02-11: 500 mg via ORAL
  Filled 2015-02-11: qty 2

## 2015-02-11 MED ORDER — METHYLPREDNISOLONE SODIUM SUCC 125 MG IJ SOLR
INTRAMUSCULAR | Status: AC
  Administered 2015-02-11: 125 mg via INTRAVENOUS
  Filled 2015-02-11: qty 2

## 2015-02-11 MED ORDER — KETOROLAC TROMETHAMINE 30 MG/ML IJ SOLN
30.0000 mg | Freq: Once | INTRAMUSCULAR | Status: AC
  Administered 2015-02-11: 30 mg via INTRAVENOUS
  Filled 2015-02-11: qty 1

## 2015-02-11 NOTE — ED Notes (Addendum)
Pt reports that he has been having cough, congestion, vomiting that began Saturday. Reports fever of 100 yesterday. Pt had rectal surgery last week and is currently on abx.

## 2015-02-11 NOTE — ED Provider Notes (Addendum)
[facility] Emergency Department Provider Note  ____________________________________________  Time seen: Approximately 1120 PM  I have reviewed the triage vital signs and the nursing notes.   HISTORY  Chief Complaint Cough    HPI Darren Allen is a 49 y.o. male with a history of COPD and diabetes who is presenting today with shortness of breath and chest pain. The patient said that one day ago he started experiencing fever as well as shortness of breath or cough. He is a history of COPD. He said that he also had several episodes of nausea and vomiting but was able tolerate his by mouth Augmentin this morning which she is taking for a perirectal abscess that was drained one week ago at this hospital. He describes the chest pain as tightness across his chest which is worse with coughing. Did not report any pain with deep breathing. No known sick contacts.    Past Medical History  Diagnosis Date  . Diabetes mellitus without complication ([place])     type 2  . COPD (chronic obstructive pulmonary disease) ([place])   . Hypertension   . Calculus of kidney 02/04/2015    Patient Active Problem List   Diagnosis Date Noted  . Type 2 diabetes mellitus ([place]) 02/04/2015  . 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 Perianal abscess 02/04/2015  . Perirectal abscess   . Chronic obstructive pulmonary disease ([place]) 01/17/2013  . Acid reflux 01/17/2013  . HLD (hyperlipidemia) 01/17/2013  . Apnea, sleep 01/17/2013  . Testicular hypofunction 01/17/2013  . Allergic state 01/17/2013  . Adiposity 07/06/2011    Past Surgical History  Procedure Laterality Date  . Appendectomy    . Incision and drainage perirectal abscess N/A 02/04/2015    Procedure: IRRIGATION AND DEBRIDEMENT PERIRECTAL ABSCESS;  Surgeon: Marlyce Huge, MD;  Location: [facility] ORS;  Service: General;  Laterality: N/A;  . Rectal exam under anesthesia  02/04/2015    Procedure: RECTAL  EXAM UNDER ANESTHESIA;  Surgeon: Marlyce Huge, MD;  Location: [facility] ORS;  Service: General;;    Current Outpatient Rx  Name  Route  Sig  Dispense  Refill  . amoxicillin-clavulanate (AUGMENTIN) 875-125 MG tablet   Oral   Take 1 tablet by mouth every 12 (twelve) hours.   24 tablet   0   . budesonide-formoterol (SYMBICORT) 80-4.5 MCG/ACT inhaler   Inhalation   Inhale 2 puffs into the lungs 2 (two) times daily.         Marland Kitchen lisinopril (PRINIVIL,ZESTRIL) 10 MG tablet   Oral   Take 10 mg by mouth daily.         Marland Kitchen omeprazole (PRILOSEC OTC) 20 MG tablet   Oral   Take 20 mg by mouth 2 (two) times daily.         Marland Kitchen oxyCODONE-acetaminophen (PERCOCET/ROXICET) 5-325 MG tablet   Oral   Take 1-2 tablets by mouth every 4 (four) hours as needed for moderate pain.   30 tablet   0   . Testosterone Cypionate 200 MG/ML KIT   Intramuscular   Inject 200 mg into the muscle every 14 (fourteen) days.         Marland Kitchen tiotropium (SPIRIVA) 18 MCG inhalation capsule   Inhalation   Place 18 mcg into inhaler and inhale daily.         Marland Kitchen azithromycin (ZITHROMAX) 250 MG tablet   Oral   Take 1 tablet (250 mg total) by mouth daily.   4 each   0   .  predniSONE (DELTASONE) 20 MG tablet   Oral   Take 3 tablets (60 mg total) by mouth daily with breakfast.   15 tablet   0     Allergies Review of patient's allergies indicates no known allergies.  No family history on file.  Social History Social History  Substance Use Topics  . Smoking status: Current Every Day Smoker -- 1.00 packs/day for 34 years    Types: Cigarettes  . Smokeless tobacco: None  . Alcohol Use: 12.0 oz/week    20 Cans of beer per week     Comment: varies    Review of Systems Constitutional: No fever/chills Eyes: No visual changes. ENT: No sore throat. Cardiovascular: As above  Respiratory: Shortness of breath with cough  Gastrointestinal: No abdominal pain.  No nausea, no vomiting.  No diarrhea.  No  constipation. Genitourinary: Negative for dysuria. Musculoskeletal: Negative for back pain. Skin: Negative for rash. Neurological: Negative for headaches, focal weakness or numbness.  10-point ROS otherwise negative.  ____________________________________________   PHYSICAL EXAM:  VITAL SIGNS: ED Triage Vitals  Enc Vitals Group     BP 02/11/15 1025 145/83 mmHg     Pulse Rate 02/11/15 1025 94     Resp 02/11/15 1025 20     Temp 02/11/15 1025 98.6 F (37 C)     Temp Source 02/11/15 1025 Oral     SpO2 02/11/15 1025 99 %     Weight 02/11/15 1025 240 lb (108.863 kg)     Height 02/11/15 1025 $RemoveBefor'6\' 2"'NELGoRUchDWF$  (1.88 m)     Head Cir --      Peak Flow --      Pain Score 02/11/15 1025 2     Pain Loc --      Pain Edu? --      Excl. in [place]? --     Constitutional: Alert and oriented. Well appearing and in no acute distress. Eyes: Conjunctivae are normal. PERRL. EOMI. Head: Atraumatic. Nose: No congestion/rhinnorhea. Mouth/Throat: Mucous membranes are moist.  Oropharynx non-erythematous. Neck: No stridor.   Cardiovascular: Normal rate, regular rhythm. Grossly normal heart sounds.  Good peripheral circulation. Respiratory: Normal respiratory effort and speaking in full sentences. Expiratory wheezes throughout with prolonged expiratory phase. No retractions. Gastrointestinal: Soft and nontender. No distention. No abdominal bruits. No CVA tenderness. External rectal exam with rubber tubing in place. There is no surrounding induration or drainage of pus. Musculoskeletal: No lower extremity tenderness nor edema.  No joint effusions. Neurologic:  Normal speech and language. No gross focal neurologic deficits are appreciated. No gait instability. Skin:  Skin is warm, dry and intact. No rash noted. Psychiatric: Mood and affect are normal. Speech and behavior are normal.  ____________________________________________   LABS (all labs ordered are listed, but only abnormal results are displayed)  Labs  Reviewed  CBC WITH DIFFERENTIAL/PLATELET - Abnormal; Notable for the following:    RDW 15.2 (*)    All other components within normal limits  BASIC METABOLIC PANEL - Abnormal; Notable for the following:    Glucose, Bld 172 (*)    All other components within normal limits  TROPONIN I  BRAIN NATRIURETIC PEPTIDE  TROPONIN I   ____________________________________________  EKG  ED ECG REPORT I, Doran Stabler, the attending physician, personally viewed and interpreted this ECG.   Date: 02/11/2015  EKG Time: 1036  Rate: 90  Rhythm: normal EKG, normal sinus rhythm  Axis: Normal axis  Intervals:none  ST&T Change: No ST segment elevation or depression. No abnormal  T-wave inversions.  ____________________________________________  RADIOLOGY  Bilateral diffuse interstitial infiltrates question atypical infection versus edema. I personally reviewed these films. ____________________________________________   PROCEDURES   ____________________________________________   INITIAL IMPRESSION / ASSESSMENT AND PLAN / ED COURSE  Pertinent labs & imaging results that were available during my care of the patient were reviewed by me and considered in my medical decision making (see chart for details).  ----------------------------------------- 3:46 PM on 02/11/2015 -----------------------------------------  Patient reevaluated and now with improved air movement with only scant extra wheezing. Patient only with a mild expiratory cough at this time. Says feeling well enough to go home. I will add azithromycin to his antibiotic regimen as this will not cover for atypical pneumonia which it appeared that the x-ray was showing. We'll also discharge with steroids. Patient now tachycardic but likely from multiple nebulizer treatments. Says has albuterol at home. Patient did have a hospital stay last week and given that he is improving with current treatment and x-ray appears diagnostic for  atypical pneumonia I believe that azithromycin will be sufficient. He understands this as well as to return for any worsening or concerning symptoms.  Pending second troponin at this time. Patient with decreased chest pain after Toradol IV. Says that the chest pain is exacerbated with coughing. I believe it is likely a musculoskeletal issue. Signed out to Dr. Cinda Quest. ____________________________________________   FINAL CLINICAL IMPRESSION(S) / ED DIAGNOSES  Acute COPD exacerbation with pneumonia.    Orbie Pyo, MD 02/11/15 1549  Orbie Pyo, MD 02/11/15 1549    Orbie Pyo, MD 02/11/15 1556

## 2015-02-11 NOTE — ED Provider Notes (Signed)
Second troponin comes back negative chest x-ray reviewed BNP is negative patient with no edema in his legs O2 sats [id number] on room air patient has some slight wheezes in his lungs more so on the basis is moving air well I will discharge him with Zithromax and prednisone as planned patient understands he needs to come back if he becomes worse again patient will continue the Augmentin  Nena Polio, MD 02/11/15 [phone]

## 2015-02-12 ENCOUNTER — Encounter: Payer: Self-pay | Admitting: Surgery

## 2015-02-14 ENCOUNTER — Ambulatory Visit: Payer: Managed Care, Other (non HMO) | Admitting: Surgery

## 2015-02-14 ENCOUNTER — Encounter: Payer: Self-pay | Admitting: Surgery

## 2015-02-15 ENCOUNTER — Ambulatory Visit (INDEPENDENT_AMBULATORY_CARE_PROVIDER_SITE_OTHER): Payer: Managed Care, Other (non HMO) | Admitting: Surgery

## 2015-02-15 ENCOUNTER — Encounter: Payer: Managed Care, Other (non HMO) | Admitting: Surgery

## 2015-02-15 ENCOUNTER — Encounter: Payer: Self-pay | Admitting: Surgery

## 2015-02-15 VITALS — BP 160/96 | HR 97 | Temp 98.3°F | Ht 74.0 in | Wt 234.4 lb

## 2015-02-15 DIAGNOSIS — K611 Rectal abscess: Secondary | ICD-10-CM

## 2015-02-15 MED ORDER — FLUCONAZOLE 200 MG PO TABS: 200.0000 mg | ORAL_TABLET | Freq: Every day | ORAL | Status: DC

## 2015-02-15 NOTE — Patient Instructions (Addendum)
Do warm water soaks on your bottom daily to keep area clean. Make sure that you dry the area thoroughly afterwards. You may use Kotex pads to place in your underpants so that your underpants do not get stained.  Please call our office with any questions or concerns.

## 2015-02-15 NOTE — Progress Notes (Signed)
49 yr old male s/p perirectal abscess with seton drain left in place.  Patient states doing well no pain in the area.  He denies any drainage to the area as well.  He has had an upper respiratory infection which he is on Azithromycin for.  He states he has some thrush now from the antibiotics  Filed Vitals:   02/15/15 0833  BP: 160/96  Pulse: 97  Temp: 98.3 F (36.8 C)   PE: Gen: NAD Rectal: soft area, no induration, on erythema, drain removed  A/P: Doing well postop, finish out antibiotics, diflucan given for thrush, may f/u prn

## 2015-08-13 ENCOUNTER — Emergency Department
Admission: EM | Admit: 2015-08-13 | Discharge: 2015-08-13 | Disposition: A | Discharge status: Home / Self Care | Payer: Commercial Indemnity | Attending: Emergency Medicine | Admitting: Emergency Medicine

## 2015-08-13 ENCOUNTER — Emergency Department: Payer: Commercial Indemnity

## 2015-08-13 ENCOUNTER — Encounter: Payer: Self-pay | Admitting: Emergency Medicine

## 2015-08-13 DIAGNOSIS — W19XXXA Unspecified fall, initial encounter: Secondary | ICD-10-CM | POA: Insufficient documentation

## 2015-08-13 DIAGNOSIS — J449 Chronic obstructive pulmonary disease, unspecified: Secondary | ICD-10-CM | POA: Insufficient documentation

## 2015-08-13 DIAGNOSIS — E119 Type 2 diabetes mellitus without complications: Secondary | ICD-10-CM | POA: Diagnosis not present

## 2015-08-13 DIAGNOSIS — Y92832 Beach as the place of occurrence of the external cause: Secondary | ICD-10-CM | POA: Diagnosis not present

## 2015-08-13 DIAGNOSIS — Z79899 Other long term (current) drug therapy: Secondary | ICD-10-CM | POA: Insufficient documentation

## 2015-08-13 DIAGNOSIS — F0781 Postconcussional syndrome: Secondary | ICD-10-CM | POA: Diagnosis not present

## 2015-08-13 DIAGNOSIS — Y999 Unspecified external cause status: Secondary | ICD-10-CM | POA: Diagnosis not present

## 2015-08-13 DIAGNOSIS — R42 Dizziness and giddiness: Secondary | ICD-10-CM | POA: Diagnosis present

## 2015-08-13 DIAGNOSIS — E785 Hyperlipidemia, unspecified: Secondary | ICD-10-CM | POA: Diagnosis not present

## 2015-08-13 DIAGNOSIS — Z7952 Long term (current) use of systemic steroids: Secondary | ICD-10-CM | POA: Diagnosis not present

## 2015-08-13 DIAGNOSIS — I10 Essential (primary) hypertension: Secondary | ICD-10-CM | POA: Diagnosis not present

## 2015-08-13 DIAGNOSIS — F17210 Nicotine dependence, cigarettes, uncomplicated: Secondary | ICD-10-CM | POA: Insufficient documentation

## 2015-08-13 DIAGNOSIS — Y939 Activity, unspecified: Secondary | ICD-10-CM | POA: Insufficient documentation

## 2015-08-13 DIAGNOSIS — S0121XA Laceration without foreign body of nose, initial encounter: Secondary | ICD-10-CM | POA: Insufficient documentation

## 2015-08-13 IMAGING — CT CT HEAD W/O CM
4 of 5 series · 15 of 47 positions shown, 17 images · non-contrast
Comparison: CT scan of [DATE].

CLINICAL DATA: Positive loss of consciousness and posttraumatic
headache after fall two days ago. Facial laceration.

EXAM:
CT HEAD WITHOUT CONTRAST
TECHNIQUE: Contiguous axial images were obtained from the base of the skull
through the vertex without intravenous contrast.

[Series 2: head wo · axial · 0.42mm/px · z∈[+48,+148]mm · 5 of 31 slices shown, 7 images]
[im 6/31  brain]
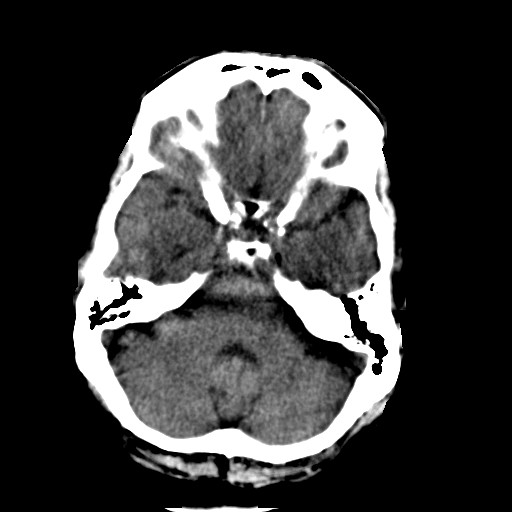
[im 6/31  bone]
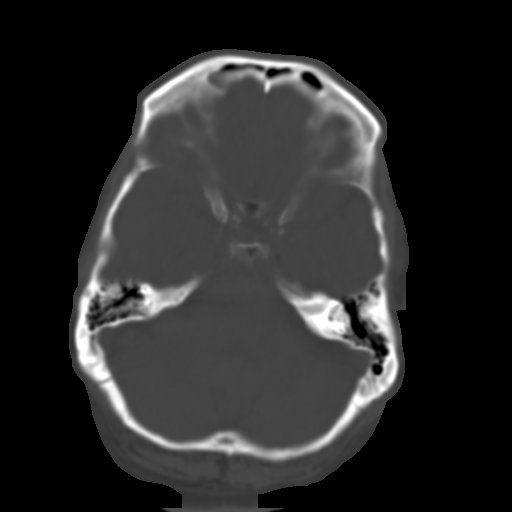
[im 11/31  brain]
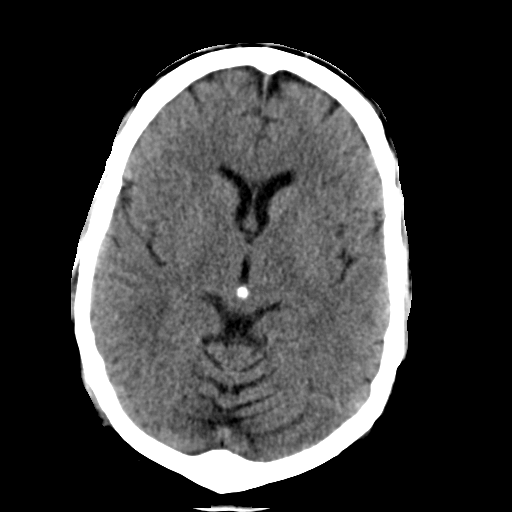
[im 16/31  brain]
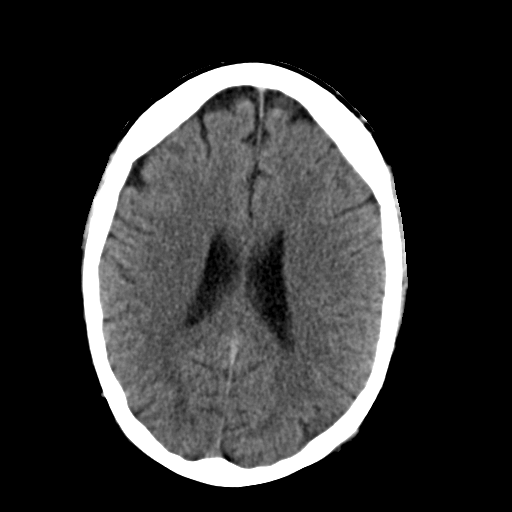
[im 21/31  brain]
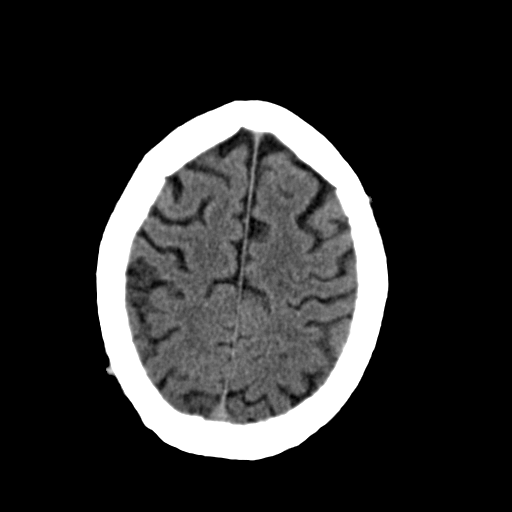
[im 26/31  brain]
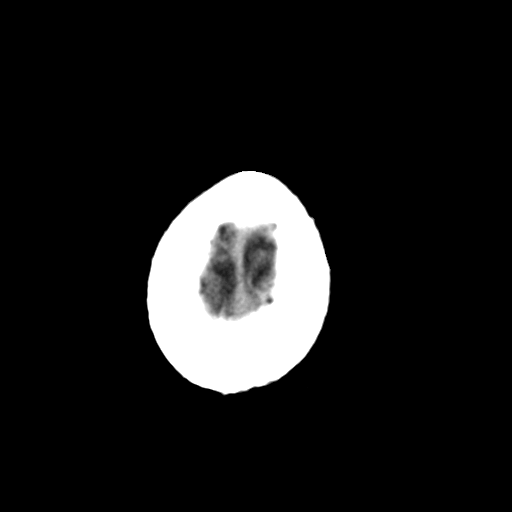
[im 26/31  bone]
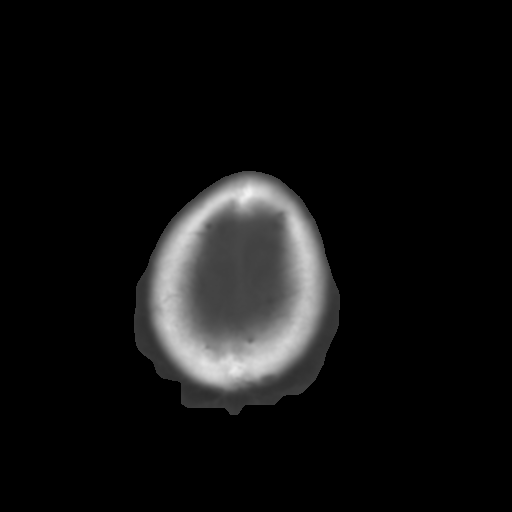

[Series 4: head wo recon · axial · 0.29mm/px · z∈[+50,+119]mm · 4 of 28 slices shown]
[im 5/28  brain]
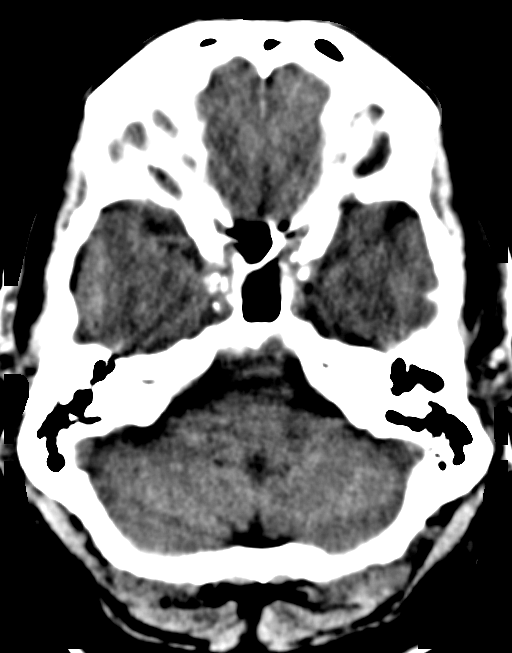
[im 10/28  brain]
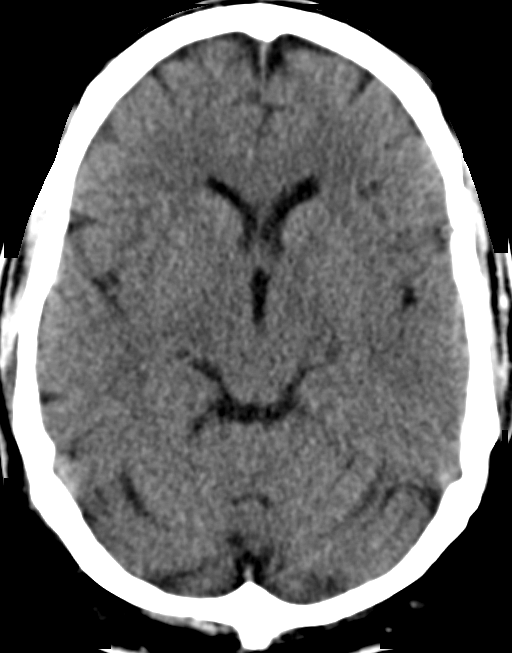
[im 14/28  brain]
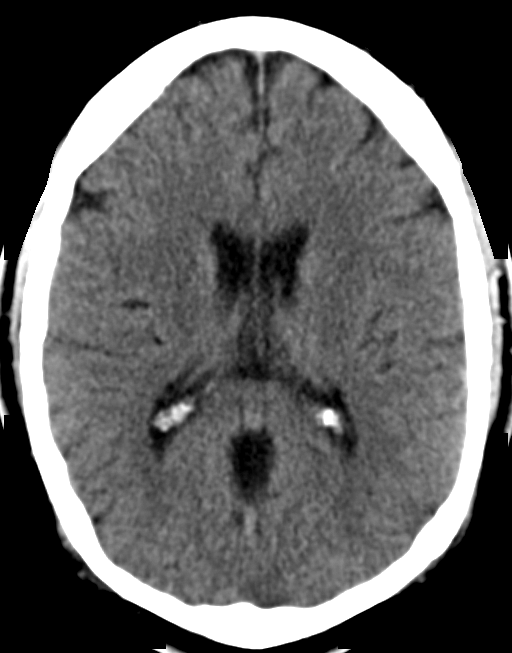
[im 19/28  brain]
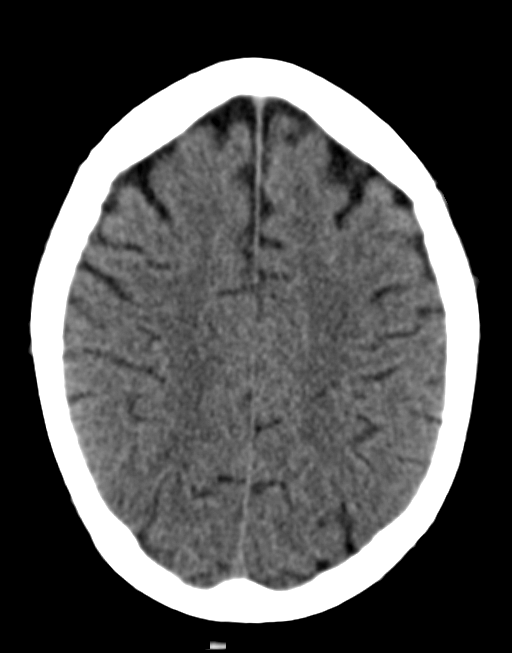

[Series 6: coronal soft tissue · coronal · 0.28mm/px · 3 of 72 slices shown]
[im 24/72  brain]
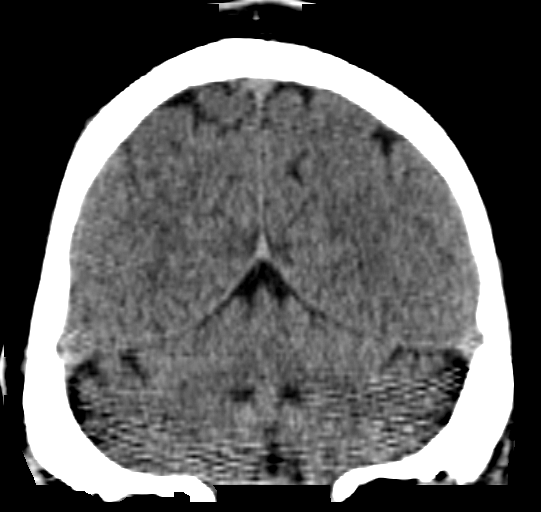
[im 32/72  brain]
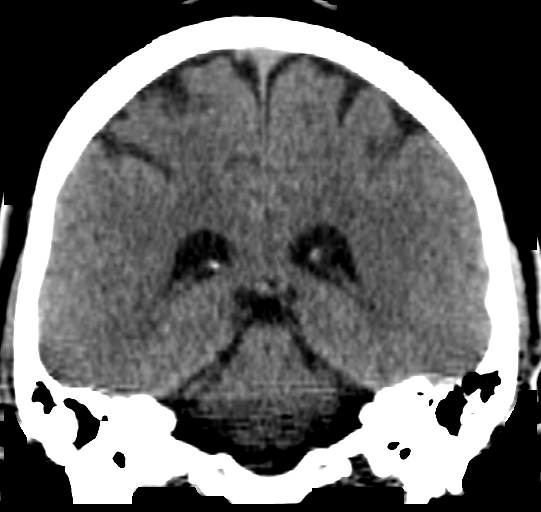
[im 40/72  brain]
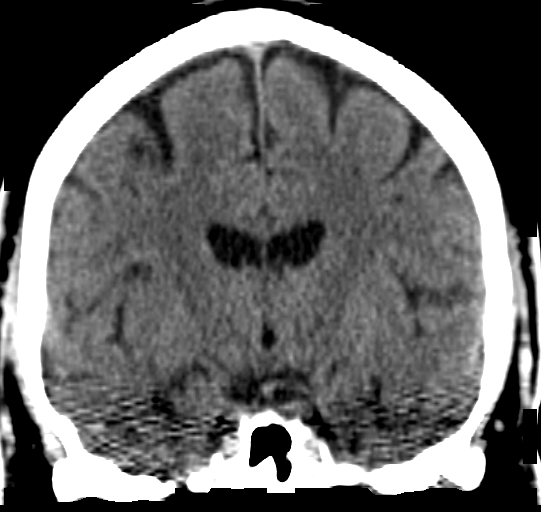

[Series 7: sagittal soft tissue · sagittal · 0.27mm/px · 3 of 52 slices shown]
[im 18/52  brain]
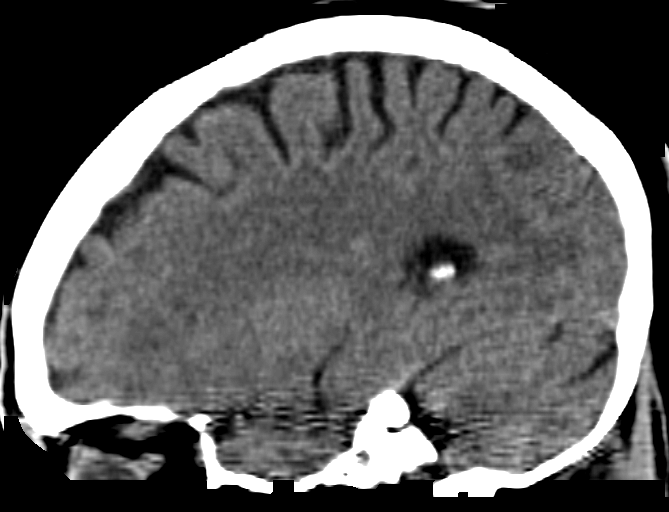
[im 26/52  brain]
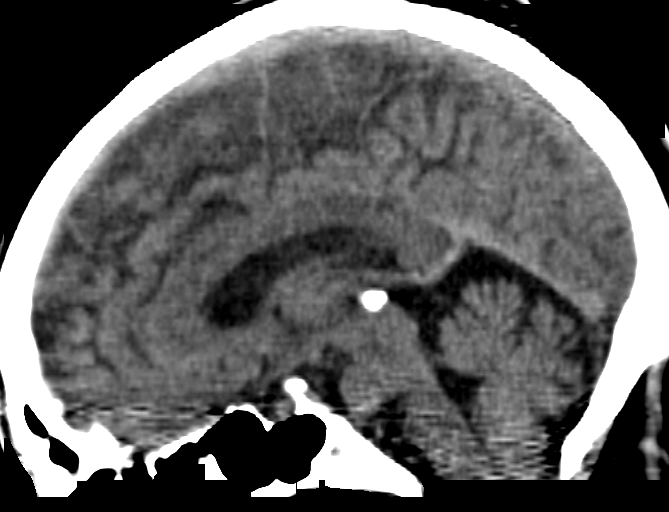
[im 35/52  brain]
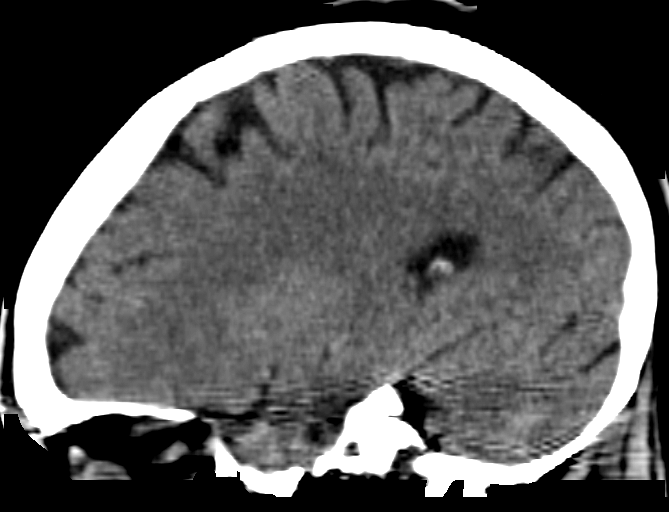

[15 of 47 positions shown; findings below may reference images not displayed]

FINDINGS: Bony calvarium appears intact. No mass effect or midline shift is
noted. Ventricular size is within normal limits. There is no
evidence of mass lesion, hemorrhage or acute infarction.
IMPRESSION: Normal head CT.

## 2015-08-13 NOTE — Discharge Instructions (Signed)
Facial Laceration A facial laceration is a cut on the face. These injuries can be painful and cause bleeding. Some cuts may need to be closed with stitches (sutures), skin adhesive strips, or wound glue. Cuts usually heal quickly but can leave a scar. It can take 1-2 years for the scar to go away completely. HOME CARE   Only take medicines as told by your doctor.  Follow your doctor's instructions for wound care. For Stitches:  Keep the cut clean and dry.  If you have a bandage (dressing), change it at least once a day. Change the bandage if it gets wet or dirty, or as told by your doctor.  Wash the cut with soap and water 2 times a day. Rinse the cut with water. Pat it dry with a clean towel.  Put a thin layer of medicated cream on the cut as told by your doctor.  You may shower after the first 24 hours. Do not soak the cut in water until the stitches are removed.  Have your stitches removed as told by your doctor.  Do not wear any makeup until a few days after your stitches are removed. For Skin Adhesive Strips:  Keep the cut clean and dry.  Do not get the strips wet. You may take a bath, but be careful to keep the cut dry.  If the cut gets wet, pat it dry with a clean towel.  The strips will fall off on their own. Do not remove the strips that are still stuck to the cut. For Wound Glue:  You may shower or take baths. Do not soak or scrub the cut. Do not swim. Avoid heavy sweating until the glue falls off on its own. After a shower or bath, pat the cut dry with a clean towel.  Do not put medicine or makeup on your cut until the glue falls off.  If you have a bandage, do not put tape over the glue.  Avoid lots of sunlight or tanning lamps until the glue falls off.  The glue will fall off on its own in 5-10 days. Do not pick at the glue. After Healing:  Put sunscreen on the cut for the first year to reduce your scar. GET HELP IF:  You have a fever. GET HELP RIGHT AWAY  IF:   Your cut area gets red, painful, or puffy (swollen).  You see a yellowish-white fluid (pus) coming from the cut.   This information is not intended to replace advice given to you by your health care provider. Make sure you discuss any questions you have with your health care provider.   Document Released: 08/19/2007 Document Revised: 03/23/2014 Document Reviewed: 10/13/2012 Elsevier Interactive Patient Education [employer].

## 2015-08-13 NOTE — ED Notes (Signed)
Pt presents with fall on Saturday injuring his nose. C/o headache and feeling funny.

## 2015-08-13 NOTE — ED Provider Notes (Signed)
[facility] Emergency Department Provider Note   ____________________________________________  Time seen: Approximately 1:45 PM  I have reviewed the triage vital signs and the nursing notes.   HISTORY  Chief Complaint Fall    HPI Darren Allen is a 50 y.o. male patient complaining of facial pressure and vertigo. Patient say he fell Sunday night at [facility]. Patient stated there was loss of consciousness. Patient was seen by paramedics but declined goes to the hospital at that time. Patient drove back home yesterday. Patient say awakened this morning with increased facial pressure mild vertigo and not feeling well. Patient denies any vision disturbance. Patient denies  nausea or vomiting. Patient also complaining of nasal pain secondary to a laceration when he fell. Patient rates his pain discomfort as a 3/10. No palliative measures taken for this complaint.  Past Medical History  Diagnosis Date  . Diabetes mellitus without complication ([place])     type 2  . COPD (chronic obstructive pulmonary disease) ([place])   . Hypertension   . Calculus of kidney 02/04/2015    Patient Active Problem List   Diagnosis Date Noted  . Type 2 diabetes mellitus ([place]) 02/04/2015  . 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 Perianal abscess 02/04/2015  . Perirectal abscess   . Chronic obstructive pulmonary disease ([place]) 01/17/2013  . Acid reflux 01/17/2013  . HLD (hyperlipidemia) 01/17/2013  . Apnea, sleep 01/17/2013  . Testicular hypofunction 01/17/2013  . Allergic state 01/17/2013  . Adiposity 07/06/2011    Past Surgical History  Procedure Laterality Date  . Appendectomy    . Incision and drainage perirectal abscess N/A 02/04/2015    Procedure: IRRIGATION AND DEBRIDEMENT PERIRECTAL ABSCESS;  Surgeon: Marlyce Huge, MD;  Location: [facility] ORS;  Service: General;  Laterality: N/A;  . Rectal exam under anesthesia  02/04/2015    Procedure:  RECTAL EXAM UNDER ANESTHESIA;  Surgeon: Marlyce Huge, MD;  Location: [facility] ORS;  Service: General;;    Current Outpatient Rx  Name  Route  Sig  Dispense  Refill  . amoxicillin-clavulanate (AUGMENTIN) 875-125 MG tablet   Oral   Take 1 tablet by mouth every 12 (twelve) hours.   24 tablet   0   . budesonide-formoterol (SYMBICORT) 80-4.5 MCG/ACT inhaler   Inhalation   Inhale 2 puffs into the lungs 2 (two) times daily.         . fluconazole (DIFLUCAN) 200 MG tablet   Oral   Take 1 tablet (200 mg total) by mouth daily.   3 tablet   0   . lisinopril (PRINIVIL,ZESTRIL) 10 MG tablet   Oral   Take 10 mg by mouth daily.         Marland Kitchen omeprazole (PRILOSEC OTC) 20 MG tablet   Oral   Take 20 mg by mouth 2 (two) times daily.         Marland Kitchen oxyCODONE-acetaminophen (PERCOCET/ROXICET) 5-325 MG tablet   Oral   Take 1-2 tablets by mouth every 4 (four) hours as needed for moderate pain.   30 tablet   0   . predniSONE (DELTASONE) 20 MG tablet   Oral   Take 3 tablets (60 mg total) by mouth daily with breakfast.   15 tablet   0   . Testosterone Cypionate 200 MG/ML KIT   Intramuscular   Inject 200 mg into the muscle every 14 (fourteen) days.         Marland Kitchen tiotropium (SPIRIVA) 18 MCG inhalation capsule   Inhalation  Place 18 mcg into inhaler and inhale daily.           Allergies Review of patient's allergies indicates no known allergies.  Family History  Problem Relation Age of Onset  . Cancer Mother 28    Lung  . Cancer Father     Colon  . Heart disease Father   . Alcohol abuse Father   . Cancer Brother 70    Esophageal  . Diabetes Brother   . Heart disease Brother     Social History Social History  Substance Use Topics  . Smoking status: Current Every Day Smoker -- 0.50 packs/day for 34 years    Types: Cigarettes  . Smokeless tobacco: Never Used  . Alcohol Use: 7.2 oz/week    12 Cans of beer per week     Comment: varies- only drinks on weekend    Review of  Systems Constitutional: No fever/chills Eyes: No visual changes. ENT: Nasal pain.  Cardiovascular: Denies chest pain. Respiratory: Denies shortness of breath. Gastrointestinal: No abdominal pain.  No nausea, no vomiting.  No diarrhea.  No constipation. Genitourinary: Negative for dysuria. Musculoskeletal: Negative for back pain. Skin: Negative for rash. Neurological: Negative for headaches, focal weakness or numbness. Endocrine:Hypertension and diabetes ____________________________________________   PHYSICAL EXAM:  VITAL SIGNS: ED Triage Vitals  Enc Vitals Group     BP 08/13/15 1156 176/109 mmHg     Pulse Rate 08/13/15 1156 110     Resp 08/13/15 1156 18     Temp 08/13/15 1156 98 F (36.7 C)     Temp Source 08/13/15 1156 Oral     SpO2 08/13/15 1156 96 %     Weight --      Height --      Head Cir --      Peak Flow --      Pain Score 08/13/15 1156 3     Pain Loc --      Pain Edu? --      Excl. in [place]? --     Constitutional: Alert and oriented. Well appearing and in no acute distress. Eyes: Conjunctivae are normal. PERRL. EOMI. Head: Atraumatic. Nose: No congestion/rhinnorhea.Mild edema Mouth/Throat: Mucous membranes are moist.  Oropharynx non-erythematous. Neck: No stridor.  No cervical spine tenderness to palpation. Hematological/Lymphatic/Immunilogical: No cervical lymphadenopathy. Cardiovascular: Normal rate, regular rhythm. Grossly normal heart sounds.  Good peripheral circulation. Elevated blood pressure. Patient state took his hypertension medicine prior to arrival. Respiratory: Normal respiratory effort.  No retractions. Lungs CTAB. Gastrointestinal: Soft and nontender. No distention. No abdominal bruits. No CVA tenderness. Musculoskeletal: No lower extremity tenderness nor edema.  No joint effusions. Neurologic:  Normal speech and language. No gross focal neurologic deficits are appreciated. No gait instability. Skin:  Skin is warm, dry and intact. No rash noted.  0.3 cm laceration across the bridge of nose. Psychiatric: Mood and affect are normal. Speech and behavior are normal.  ____________________________________________   LABS (all labs ordered are listed, but only abnormal results are displayed)  Labs Reviewed - No data to display ____________________________________________  EKG   ____________________________________________  RADIOLOGY  Discussed negative CTs findings of the head. ____________________________________________   PROCEDURES  Procedure(s) performed: None  Critical Care performed: No  ____________________________________________   INITIAL IMPRESSION / ASSESSMENT AND PLAN / ED COURSE  Pertinent labs & imaging results that were available during my care of the patient were reviewed by me and considered in my medical decision making (see chart for details).  Nasal laceration. Discussed with  patient rationale for not so note the wound since is old. Postconcussion syndrome. Discussed negative CT findings with patient. Patient given discharge care instructions. Patient advised to take Tylenol for any headache or pain at this time. Patient given a work note for 2 days and advised to follow-up family doctor in 3 days if no improvement. ____________________________________________   FINAL CLINICAL IMPRESSION(S) / ED DIAGNOSES  Final diagnoses:  Postconcussion syndrome  Nasal laceration, initial encounter      NEW MEDICATIONS STARTED DURING THIS VISIT:  New Prescriptions   No medications on file     Note:  This document was prepared using Dragon voice recognition software and may include unintentional dictation errors.    Sable Feil, PA-C 08/13/15 1357  Carrie Mew, MD 08/13/15 [phone]
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
## 2016-04-16 DIAGNOSIS — E1140 Type 2 diabetes mellitus with diabetic neuropathy, unspecified: Secondary | ICD-10-CM | POA: Diagnosis present

## 2016-05-05 ENCOUNTER — Emergency Department: Payer: Managed Care, Other (non HMO)

## 2016-05-05 ENCOUNTER — Emergency Department
Admission: EM | Admit: 2016-05-05 | Discharge: 2016-05-05 | Disposition: A | Discharge status: Home / Self Care | Payer: Managed Care, Other (non HMO) | Attending: Emergency Medicine | Admitting: Emergency Medicine

## 2016-05-05 DIAGNOSIS — R0789 Other chest pain: Secondary | ICD-10-CM

## 2016-05-05 DIAGNOSIS — I10 Essential (primary) hypertension: Secondary | ICD-10-CM

## 2016-05-05 DIAGNOSIS — Z79899 Other long term (current) drug therapy: Secondary | ICD-10-CM | POA: Insufficient documentation

## 2016-05-05 DIAGNOSIS — E119 Type 2 diabetes mellitus without complications: Secondary | ICD-10-CM | POA: Diagnosis not present

## 2016-05-05 DIAGNOSIS — F17210 Nicotine dependence, cigarettes, uncomplicated: Secondary | ICD-10-CM | POA: Insufficient documentation

## 2016-05-05 DIAGNOSIS — J449 Chronic obstructive pulmonary disease, unspecified: Secondary | ICD-10-CM | POA: Diagnosis not present

## 2016-05-05 LAB — CBC
HEMATOCRIT: 44.3 % (ref 40.0–52.0)
Hemoglobin: 14.8 g/dL (ref 13.0–18.0)
MCH: 26.3 pg (ref 26.0–34.0)
MCHC: 33.3 g/dL (ref 32.0–36.0)
MCV: 79 fL — AB (ref 80.0–100.0)
PLATELETS: 279 10*3/uL (ref 150–440)
RBC: 5.61 MIL/uL (ref 4.40–5.90)
RDW: 14.9 % — ABNORMAL HIGH (ref 11.5–14.5)
WBC: 10 10*3/uL (ref 3.8–10.6)

## 2016-05-05 LAB — BASIC METABOLIC PANEL
Anion gap: 8 (ref 5–15)
BUN: 13 mg/dL (ref 6–20)
CHLORIDE: 102 mmol/L (ref 101–111)
CO2: 26 mmol/L (ref 22–32)
CREATININE: 0.98 mg/dL (ref 0.61–1.24)
Calcium: 9.1 mg/dL (ref 8.9–10.3)
GFR calc Af Amer: >60 mL/min (ref >60)
GFR calc non Af Amer: >60 mL/min (ref >60)
Glucose, Bld: 115 mg/dL — ABNORMAL HIGH (ref 65–99)
POTASSIUM: 3.8 mmol/L (ref 3.5–5.1)
Sodium: 136 mmol/L (ref 135–145)

## 2016-05-05 LAB — TROPONIN I: Troponin I: <0.03 ng/mL (ref <0.03)

## 2016-05-05 IMAGING — CR DG CHEST 2V
1 series · 2 of 2 positions shown · non-contrast
Comparison: [DATE]

CLINICAL DATA: Chest pain with elevated BP

EXAM:
CHEST  2 VIEW

[Series 1: dg chest 2 view · 0.14mm/px · 2 of 2 slices shown]
[im 1/2]
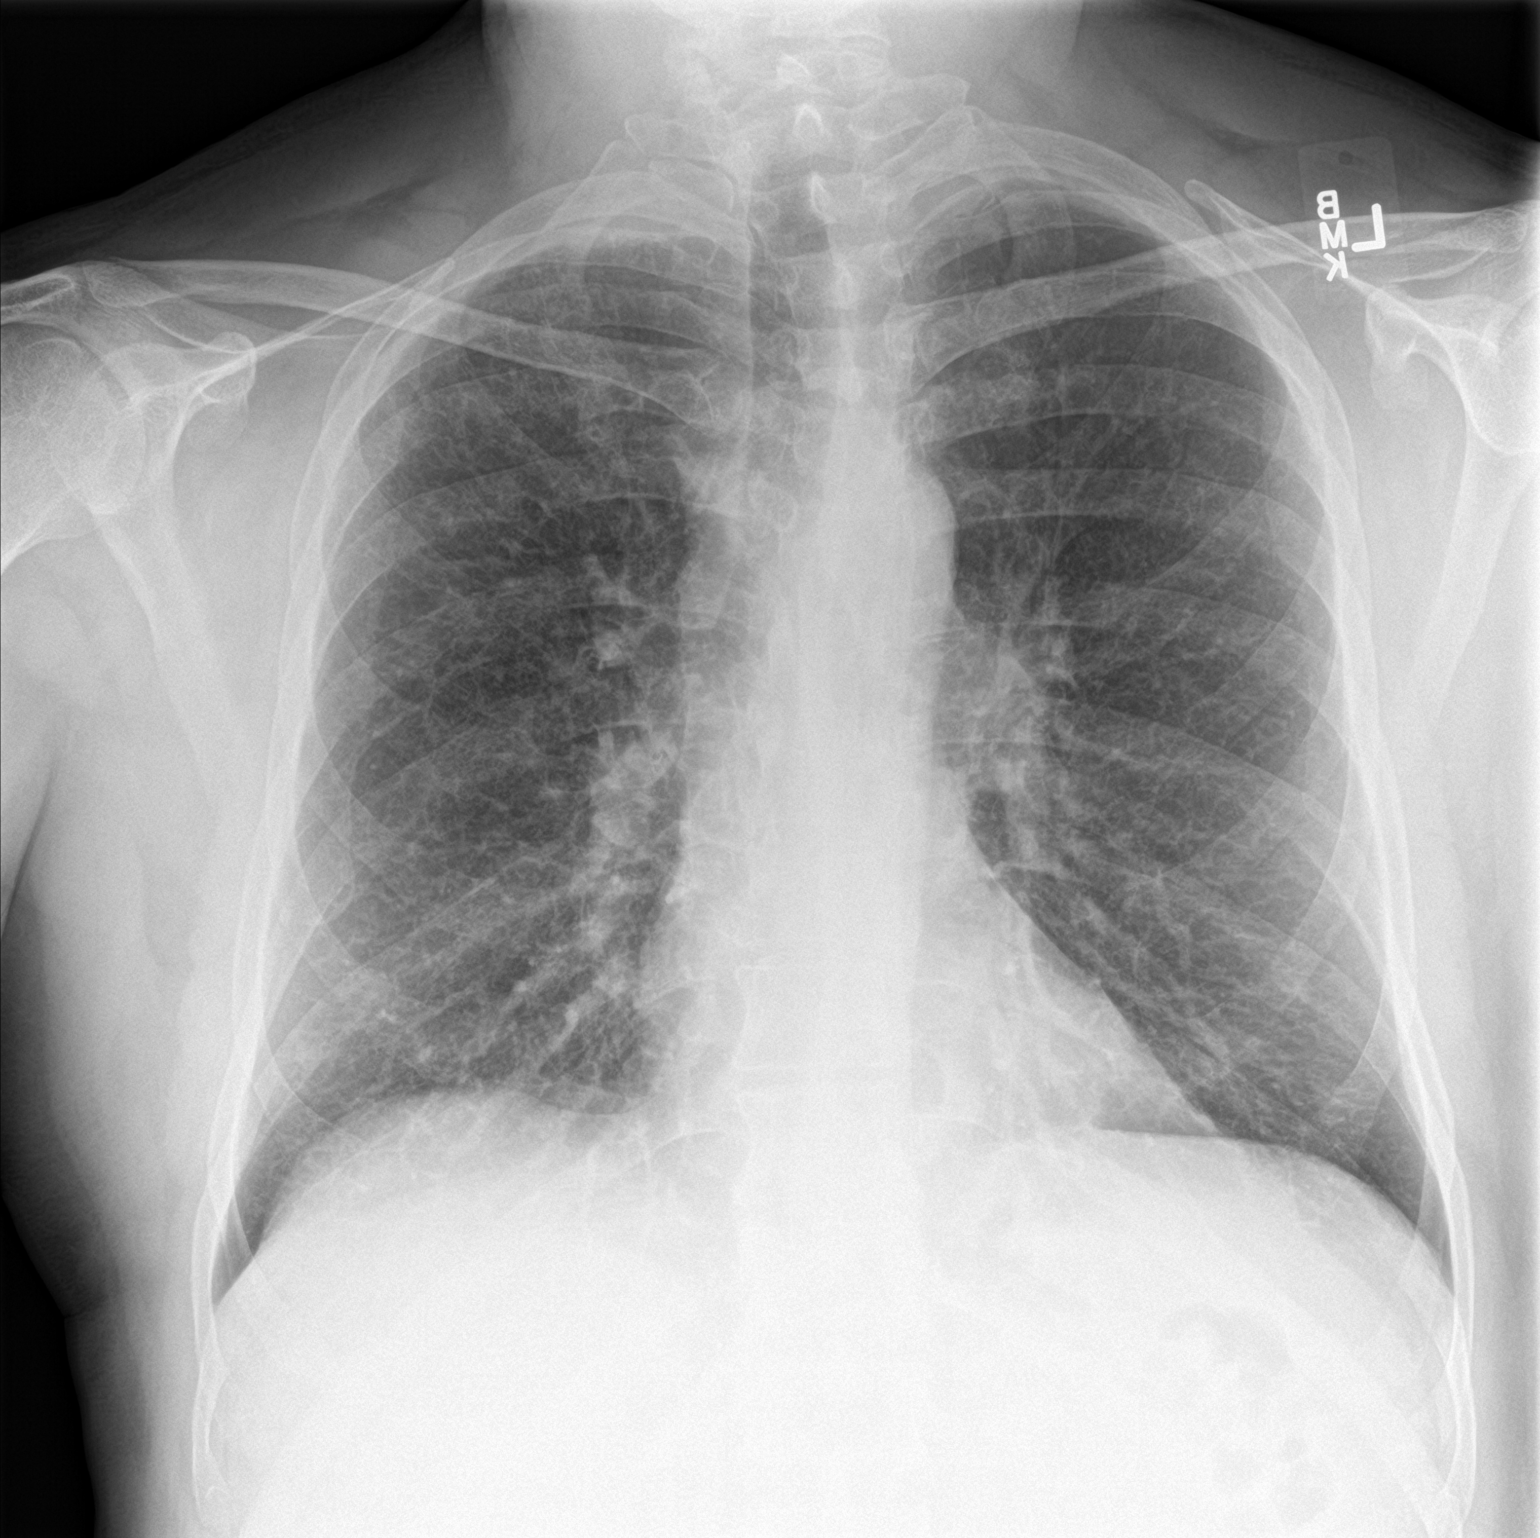
[im 2/2]
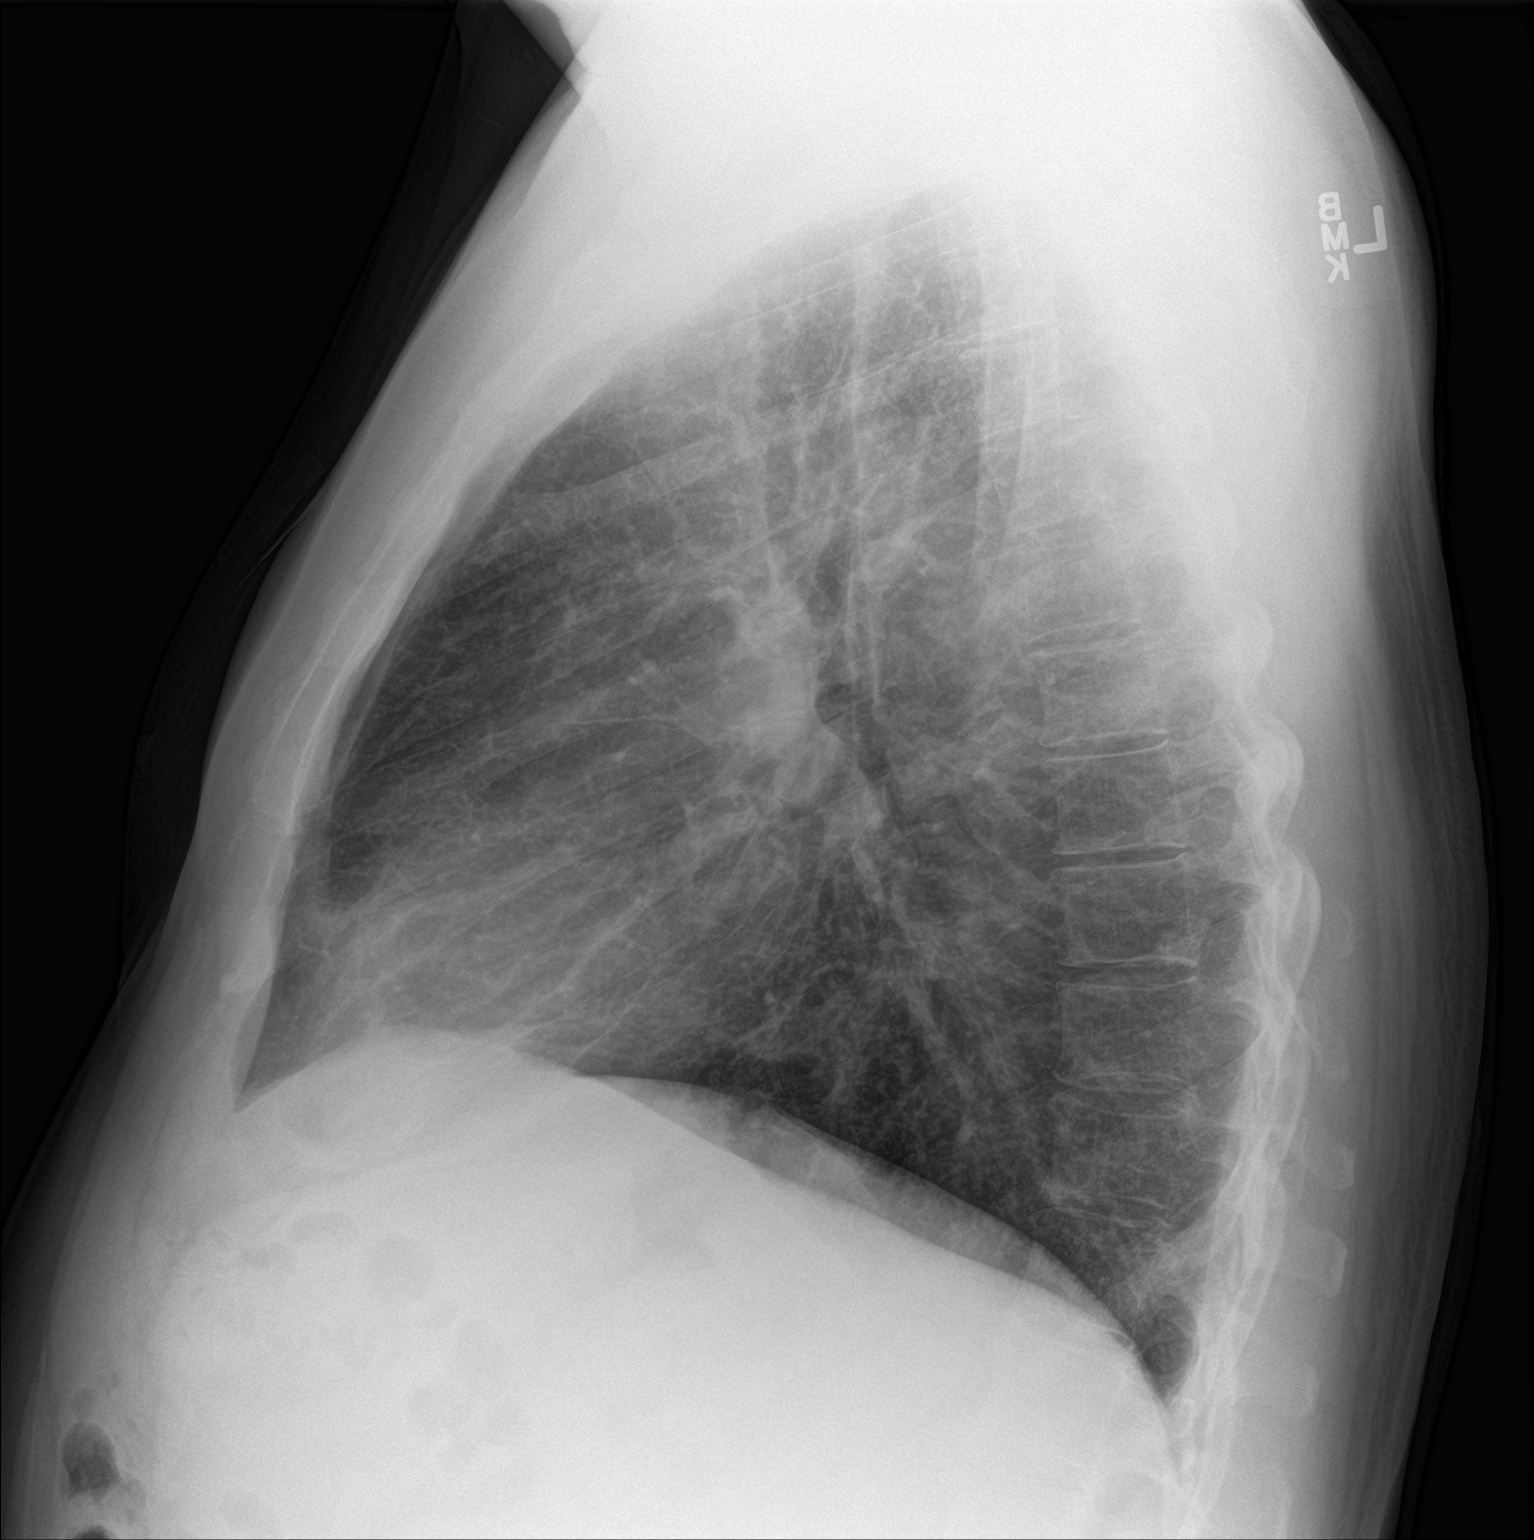

[2 of 2 positions shown; findings below may reference images not displayed]

FINDINGS: Coarse diffuse interstitial opacities as before, likely chronic. No
acute infiltrate or effusion. Cardiomediastinal silhouette within
normal limits. No pneumothorax.
IMPRESSION: 1. Diffuse coarse interstitial opacities suspect chronic change
2. No radiographic evidence for acute cardiopulmonary abnormality.

## 2016-05-05 MED ORDER — LISINOPRIL 30 MG PO TABS: 30.0000 mg | ORAL_TABLET | Freq: Every day | ORAL | 0 refills | Status: DC

## 2016-05-05 NOTE — ED Triage Notes (Signed)
Pt reports chest pain with elevated BP - chest pain under left breast started today but has been coming and going for the past month - denies radiation - reports nausea but denies vomiting - reports shortness of breath today (hx COPD) - skin is warm/dry - respirations are even/unlabored

## 2016-05-05 NOTE — ED Provider Notes (Signed)
[facility] Emergency Department Provider Note   First MD Initiated Contact with Patient 05/05/16 0421     (approximate)  I have reviewed the triage vital signs and the nursing notes.   HISTORY  Chief Complaint Chest Pain    HPI Darren Allen is a 51 y.o. male with bolus of chronic medical conditions presents to the emergency department with history of brief left-sided chest discomfort which is not completely resolved. Patient states that "main reason I came in was because my blood pressure has been high". Patient states his blood pressure is been high over the course of the last month and that he is taking his lisinopril as prescribed. Patient admits to headache intermittently since he began taking metformin 2 weeks ago. Patient denies any nausea or vomiting. Patient denies any weakness numbness or gait instability.   Past Medical History:  Diagnosis Date  . 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 COPD (chronic obstructive pulmonary disease) ([place])   . Diabetes mellitus without complication ([place])    type 2  . Hypertension     Patient Active Problem List   Diagnosis Date Noted  . Type 2 diabetes mellitus ([place]) 02/04/2015  . 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 Perianal abscess 02/04/2015  . Perirectal abscess   . Chronic obstructive pulmonary disease ([place]) 01/17/2013  . Acid reflux 01/17/2013  . HLD (hyperlipidemia) 01/17/2013  . Apnea, sleep 01/17/2013  . Testicular hypofunction 01/17/2013  . Allergic state 01/17/2013  . Adiposity 07/06/2011    Past Surgical History:  Procedure Laterality Date  . APPENDECTOMY    . INCISION AND DRAINAGE PERIRECTAL ABSCESS N/A 02/04/2015   Procedure: IRRIGATION AND DEBRIDEMENT PERIRECTAL ABSCESS;  Surgeon: Marlyce Huge, MD;  Location: [facility] ORS;  Service: General;  Laterality: N/A;  . RECTAL EXAM UNDER ANESTHESIA  02/04/2015   Procedure: RECTAL EXAM UNDER ANESTHESIA;  Surgeon:  Marlyce Huge, MD;  Location: [facility] ORS;  Service: General;;    Prior to Admission medications   Medication Sig Start Date End Date Taking? Authorizing Provider  amoxicillin-clavulanate (AUGMENTIN) 875-125 MG tablet Take 1 tablet by mouth every 12 (twelve) hours. 02/06/15   Marlyce Huge, MD  budesonide-formoterol (SYMBICORT) 80-4.5 MCG/ACT inhaler Inhale 2 puffs into the lungs 2 (two) times daily.    Historical Provider, MD  fluconazole (DIFLUCAN) 200 MG tablet Take 1 tablet (200 mg total) by mouth daily. 02/15/15   Hubbard Robinson, MD  lisinopril (PRINIVIL,ZESTRIL) 10 MG tablet Take 10 mg by mouth daily.    Historical Provider, MD  omeprazole (PRILOSEC OTC) 20 MG tablet Take 20 mg by mouth 2 (two) times daily.    Historical Provider, MD  oxyCODONE-acetaminophen (PERCOCET/ROXICET) 5-325 MG tablet Take 1-2 tablets by mouth every 4 (four) hours as needed for moderate pain. 02/06/15   Marlyce Huge, MD  predniSONE (DELTASONE) 20 MG tablet Take 3 tablets (60 mg total) by mouth daily with breakfast. 02/11/15   Orbie Pyo, MD  Testosterone Cypionate 200 MG/ML KIT Inject 200 mg into the muscle every 14 (fourteen) days.    Historical Provider, MD  tiotropium (SPIRIVA) 18 MCG inhalation capsule Place 18 mcg into inhaler and inhale daily.    Historical Provider, MD    Allergies No known drug allergies  Family History  Problem Relation Age of Onset  . Cancer Mother 10    Lung  . Cancer Father     Colon  . Heart disease Father   . Alcohol abuse Father   .  Cancer Brother 43    Esophageal  . Diabetes Brother   . Heart disease Brother     Social History Social History  Substance Use Topics  . Smoking status: Current Every Day Smoker    Packs/day: 0.50    Years: 34.00    Types: Cigarettes  . Smokeless tobacco: Never Used  . Alcohol use 7.2 oz/week    12 Cans of beer per week     Comment: varies- only drinks on weekend    Review of  Systems Constitutional: No fever/chills Eyes: No visual changes. ENT: No sore throat. Cardiovascular: Denies chest pain. Respiratory: Denies shortness of breath. Gastrointestinal: No abdominal pain.  No nausea, no vomiting.  No diarrhea.  No constipation. Genitourinary: Negative for dysuria. Musculoskeletal: Negative for back pain. Skin: Negative for rash. Neurological: Negative for headaches, focal weakness or numbness.  10-point ROS otherwise negative.  ____________________________________________   PHYSICAL EXAM:  VITAL SIGNS: ED Triage Vitals [05/05/16 0156]  Enc Vitals Group     BP (!) 175/86     Pulse Rate 96     Resp 16     Temp 97.9 F (36.6 C)     Temp Source Oral     SpO2 97 %     Weight 235 lb (106.6 kg)     Height '6\' 2"'$  (1.88 m)     Head Circumference      Peak Flow      Pain Score 0     Pain Loc      Pain Edu?      Excl. in [place]?     Constitutional: Alert and oriented. Well appearing and in no acute distress. Eyes: Conjunctivae are normal. PERRL. EOMI. Head: Atraumatic. Mouth/Throat: Mucous membranes are moist. Oropharynx non-erythematous. Neck: No stridor.  No meningeal signs.  No cervical spine tenderness to palpation. Cardiovascular: Normal rate, regular rhythm. Good peripheral circulation. Grossly normal heart sounds. Respiratory: Normal respiratory effort.  No retractions. Lungs CTAB. Gastrointestinal: Soft and nontender. No distention.  Musculoskeletal: No lower extremity tenderness nor edema. No gross deformities of extremities. Neurologic:  Normal speech and language. No gross focal neurologic deficits are appreciated.  Skin:  Skin is warm, dry and intact. No rash noted. Psychiatric: Mood and affect are normal. Speech and behavior are normal.  ____________________________________________   LABS (all labs ordered are listed, but only abnormal results are displayed)  Labs Reviewed  BASIC METABOLIC PANEL - Abnormal; Notable for the following:        Result Value   Glucose, Bld 115 (*)    All other components within normal limits  CBC - Abnormal; Notable for the following:    MCV 79.0 (*)    RDW 14.9 (*)    All other components within normal limits  TROPONIN I   ____________________________________________  EKG  ED ECG REPORT I, [place] N Loreal Schuessler, the attending physician, personally viewed and interpreted this ECG.   Date: 05/05/2016  EKG Time: 2:01 AM  Rate: 91  Rhythm: Normal sinus rhythm  Axis: Normal  Intervals: Normal  ST&T Change: None  ____________________________________________  RADIOLOGY I, [place] N Mychal Decarlo, personally viewed and evaluated these images (plain radiographs) as part of my medical decision making, as well as reviewing the written report by the radiologist.  Dg Chest 2 View  Result Date: 05/05/2016 CLINICAL DATA:  Chest pain with elevated BP EXAM: CHEST  2 VIEW COMPARISON:  02/11/2015 FINDINGS: Coarse diffuse interstitial opacities as before, likely chronic. No acute infiltrate or effusion. Cardiomediastinal silhouette  within normal limits. No pneumothorax. IMPRESSION: 1. Diffuse coarse interstitial opacities suspect chronic change 2. No radiographic evidence for acute cardiopulmonary abnormality. Electronically Signed   By: Donavan Foil M.D.   On: 05/05/2016 02:21     Procedures     INITIAL IMPRESSION / ASSESSMENT AND PLAN / ED COURSE  Pertinent labs & imaging results that were available during my care of the patient were reviewed by me and considered in my medical decision making (see chart for details).  Patient has no chest pain at present. Troponin negative repeat blood pressure 131/90. Patient and intermittent headache most likely secondary to recently starting metformin. Patient has no neurological deficits on exam.      ____________________________________________  FINAL CLINICAL IMPRESSION(S) / ED DIAGNOSES  Final diagnoses:  Essential hypertension     MEDICATIONS  GIVEN DURING THIS VISIT:  Medications - No data to display   NEW OUTPATIENT MEDICATIONS STARTED DURING THIS VISIT:  New Prescriptions   No medications on file    Modified Medications   No medications on file    Discontinued Medications   No medications on file     Note:  This document was prepared using Dragon voice recognition software and may include unintentional dictation errors.    Gregor Hams, MD 05/05/16 [phone]

## 2016-12-10 ENCOUNTER — Emergency Department: Payer: Managed Care, Other (non HMO)

## 2016-12-10 ENCOUNTER — Emergency Department
Admission: EM | Admit: 2016-12-10 | Discharge: 2016-12-10 | Disposition: A | Discharge status: Home / Self Care | Payer: Managed Care, Other (non HMO) | Attending: Student in an Organized Health Care Education/Training Program | Admitting: Student in an Organized Health Care Education/Training Program

## 2016-12-10 ENCOUNTER — Encounter: Payer: Self-pay | Admitting: Emergency Medicine

## 2016-12-10 DIAGNOSIS — Y901 Blood alcohol level of 20-39 mg/100 ml: Secondary | ICD-10-CM | POA: Insufficient documentation

## 2016-12-10 DIAGNOSIS — S0990XA Unspecified injury of head, initial encounter: Secondary | ICD-10-CM | POA: Diagnosis not present

## 2016-12-10 DIAGNOSIS — F1010 Alcohol abuse, uncomplicated: Secondary | ICD-10-CM | POA: Insufficient documentation

## 2016-12-10 DIAGNOSIS — Y929 Unspecified place or not applicable: Secondary | ICD-10-CM | POA: Diagnosis not present

## 2016-12-10 DIAGNOSIS — Y999 Unspecified external cause status: Secondary | ICD-10-CM | POA: Insufficient documentation

## 2016-12-10 DIAGNOSIS — Y939 Activity, unspecified: Secondary | ICD-10-CM | POA: Diagnosis not present

## 2016-12-10 DIAGNOSIS — R55 Syncope and collapse: Secondary | ICD-10-CM | POA: Diagnosis not present

## 2016-12-10 DIAGNOSIS — F17210 Nicotine dependence, cigarettes, uncomplicated: Secondary | ICD-10-CM | POA: Insufficient documentation

## 2016-12-10 DIAGNOSIS — Z79899 Other long term (current) drug therapy: Secondary | ICD-10-CM | POA: Insufficient documentation

## 2016-12-10 DIAGNOSIS — J449 Chronic obstructive pulmonary disease, unspecified: Secondary | ICD-10-CM | POA: Diagnosis not present

## 2016-12-10 DIAGNOSIS — W010XXA Fall on same level from slipping, tripping and stumbling without subsequent striking against object, initial encounter: Secondary | ICD-10-CM | POA: Diagnosis not present

## 2016-12-10 DIAGNOSIS — E119 Type 2 diabetes mellitus without complications: Secondary | ICD-10-CM | POA: Insufficient documentation

## 2016-12-10 DIAGNOSIS — M79642 Pain in left hand: Secondary | ICD-10-CM | POA: Insufficient documentation

## 2016-12-10 DIAGNOSIS — I10 Essential (primary) hypertension: Secondary | ICD-10-CM | POA: Insufficient documentation

## 2016-12-10 DIAGNOSIS — Z23 Encounter for immunization: Secondary | ICD-10-CM | POA: Diagnosis not present

## 2016-12-10 LAB — URINALYSIS, COMPLETE (UACMP) WITH MICROSCOPIC
BACTERIA UA: NONE SEEN
BILIRUBIN URINE: NEGATIVE
Glucose, UA: NEGATIVE mg/dL
Hgb urine dipstick: NEGATIVE
KETONES UR: NEGATIVE mg/dL
Leukocytes, UA: NEGATIVE
Nitrite: NEGATIVE
Protein, ur: NEGATIVE mg/dL
Specific Gravity, Urine: 1.016 (ref 1.005–1.030)
pH: 5 (ref 5.0–8.0)

## 2016-12-10 LAB — TROPONIN I: Troponin I: <0.03 ng/mL (ref <0.03)

## 2016-12-10 LAB — CBC
HCT: 43.4 % (ref 40.0–52.0)
HEMOGLOBIN: 15 g/dL (ref 13.0–18.0)
MCH: 29.4 pg (ref 26.0–34.0)
MCHC: 34.5 g/dL (ref 32.0–36.0)
MCV: 85 fL (ref 80.0–100.0)
Platelets: 228 10*3/uL (ref 150–440)
RBC: 5.1 MIL/uL (ref 4.40–5.90)
RDW: 15.1 % — ABNORMAL HIGH (ref 11.5–14.5)
WBC: 9.5 10*3/uL (ref 3.8–10.6)

## 2016-12-10 LAB — BASIC METABOLIC PANEL
Anion gap: 12 (ref 5–15)
BUN: 23 mg/dL — ABNORMAL HIGH (ref 6–20)
CHLORIDE: 101 mmol/L (ref 101–111)
CO2: 21 mmol/L — AB (ref 22–32)
Calcium: 9.4 mg/dL (ref 8.9–10.3)
Creatinine, Ser: 1.38 mg/dL — ABNORMAL HIGH (ref 0.61–1.24)
GFR calc Af Amer: >60 mL/min (ref >60)
GFR calc non Af Amer: 57 mL/min — ABNORMAL LOW (ref >60)
Glucose, Bld: 137 mg/dL — ABNORMAL HIGH (ref 65–99)
Potassium: 4.3 mmol/L (ref 3.5–5.1)
Sodium: 134 mmol/L — ABNORMAL LOW (ref 135–145)

## 2016-12-10 LAB — MAGNESIUM: MAGNESIUM: 1.8 mg/dL (ref 1.7–2.4)

## 2016-12-10 LAB — ETHANOL: ALCOHOL ETHYL (B): 23 mg/dL — AB (ref <10)

## 2016-12-10 IMAGING — CR DG HAND COMPLETE 3+V*L*
1 series · 3 of 3 positions shown · non-contrast
Comparison: None in PACs

CLINICAL DATA: Status post fall last night following a syncopal
episode. Persistent left hand pain involving the palm in third
digit.

EXAM:
LEFT HAND - COMPLETE 3+ VIEW

[Series 1: x hand pa left · 0.14mm/px · 3 of 3 slices shown]
[im 1/3]
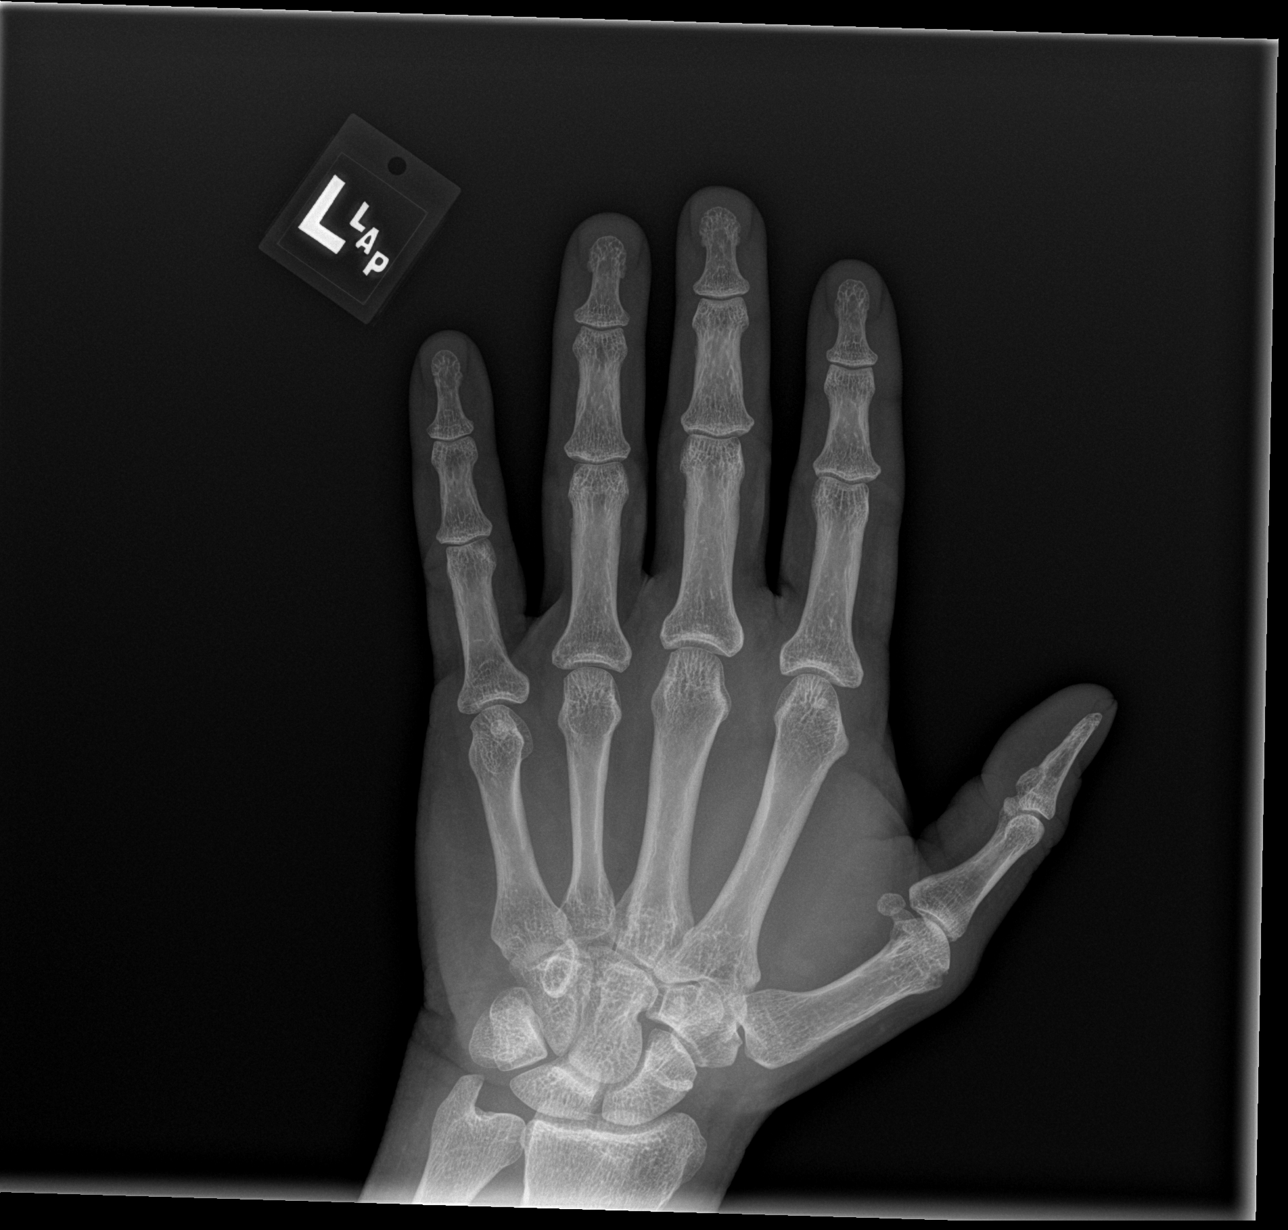
[im 2/3]
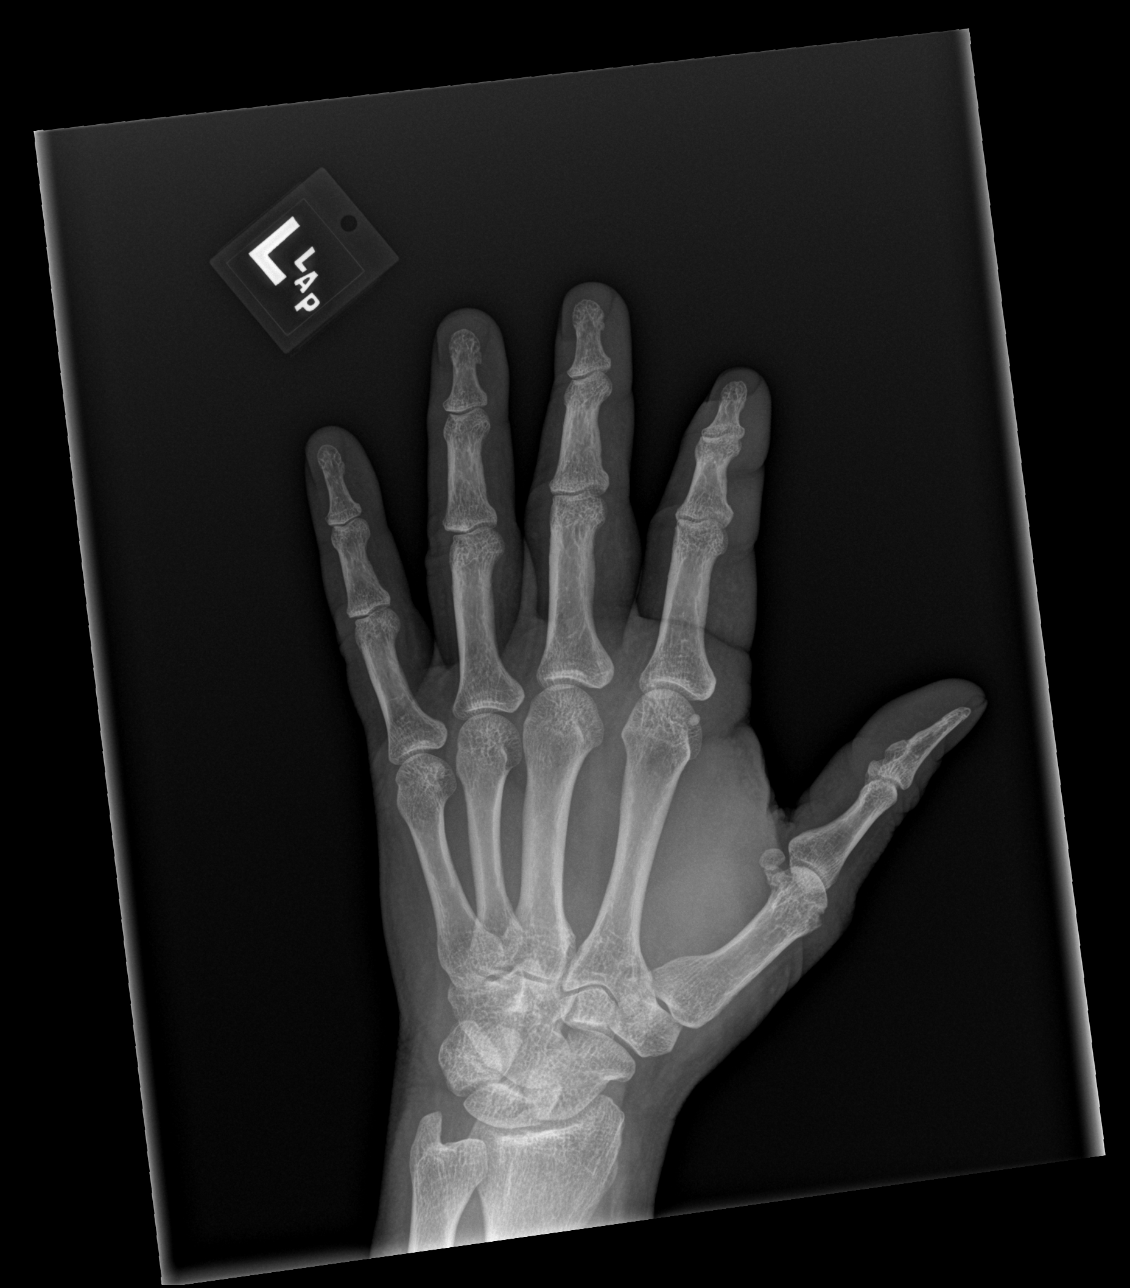
[im 3/3]
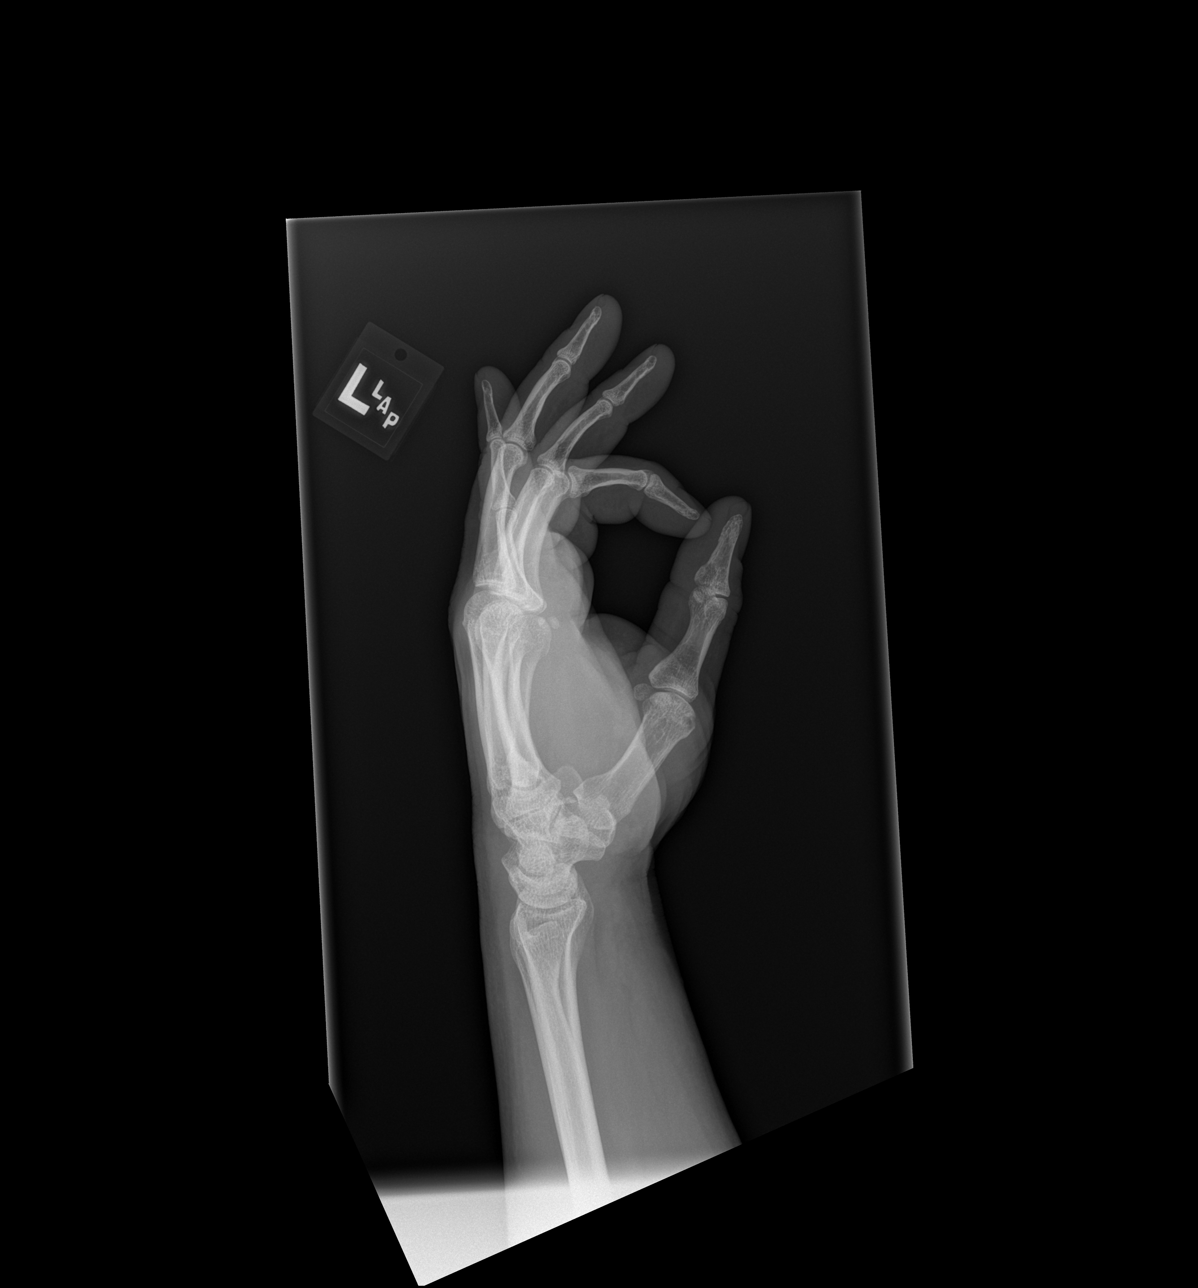

[3 of 3 positions shown; findings below may reference images not displayed]

FINDINGS: The bones are subjectively adequately mineralized. There is no acute
fracture nor dislocation. The soft tissues are unremarkable. The
interphalangeal and MCP joint spaces are reasonably well-maintained.
The observed portions of the wrist exhibit no acute abnormalities.
The soft tissues are unremarkable.
IMPRESSION: There is no acute bony abnormality of the left hand.

## 2016-12-10 IMAGING — CT CT HEAD W/O CM
3 series · 16 of 47 positions shown, 19 images · non-contrast
Comparison: [DATE]

CLINICAL DATA: Found on floor last night. Possible syncopal episode
per ETOH. Bruising and bridge of nose laceration. Headache.

EXAM:
CT HEAD WITHOUT CONTRAST
TECHNIQUE: Contiguous axial images were obtained from the base of the skull
through the vertex without intravenous contrast.

[Series 3: head wo · axial · 0.43mm/px · z∈[-115,+15]mm · 10 of 32 slices shown, 13 images]
[im 3/32  brain]
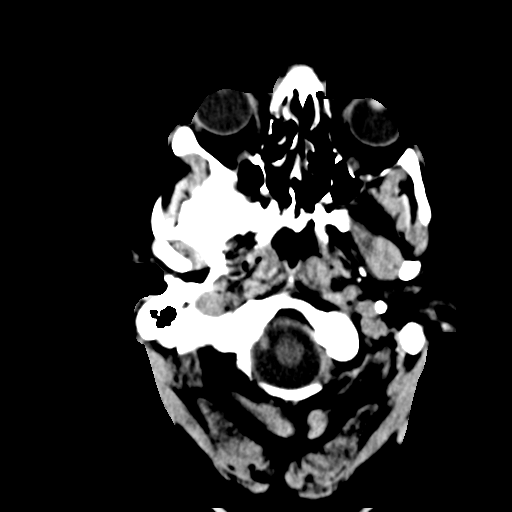
[im 3/32  bone]
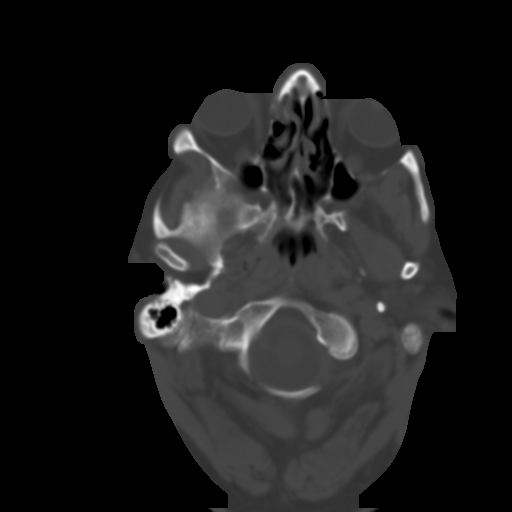
[im 6/32  brain]
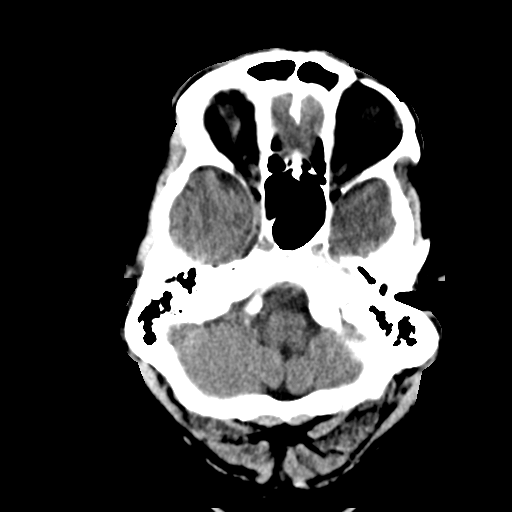
[im 9/32  brain]
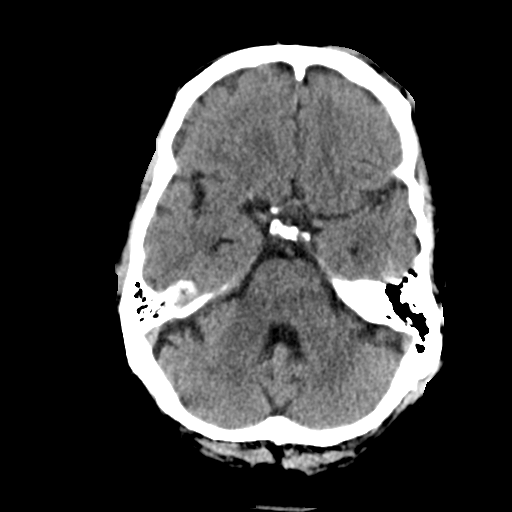
[im 11/32  brain]
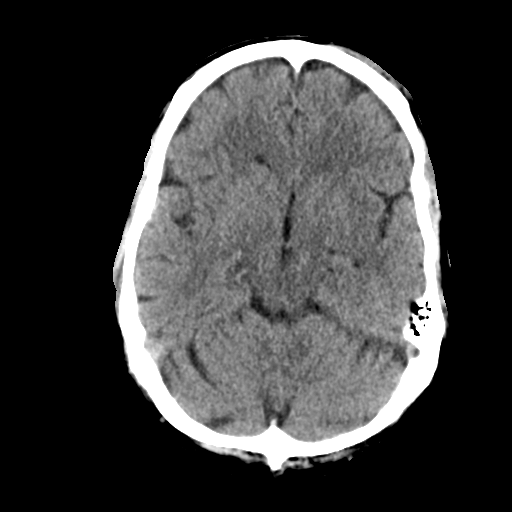
[im 14/32  brain]
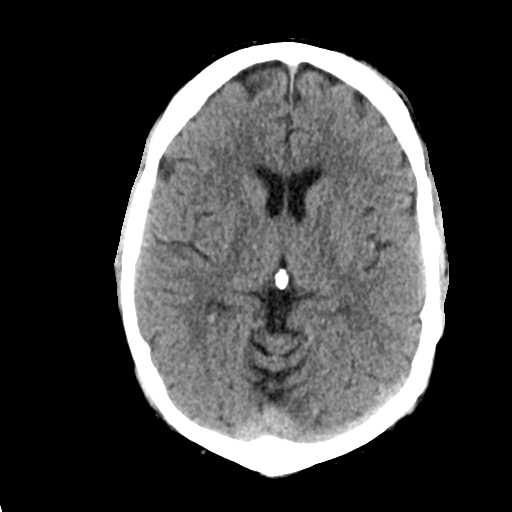
[im 14/32  bone]
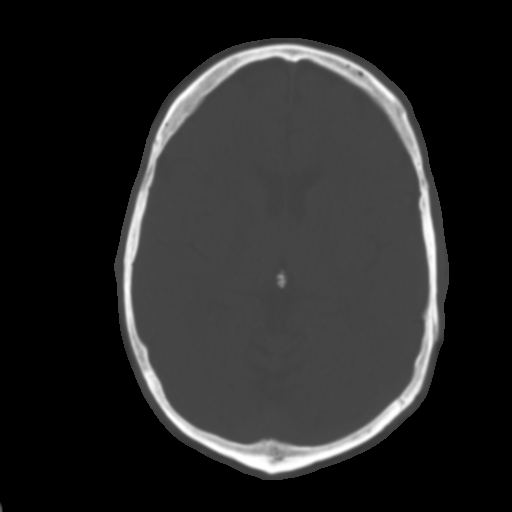
[im 18/32  brain]
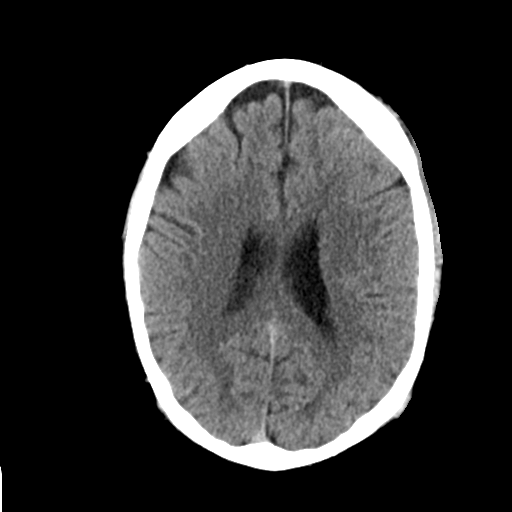
[im 21/32  brain]
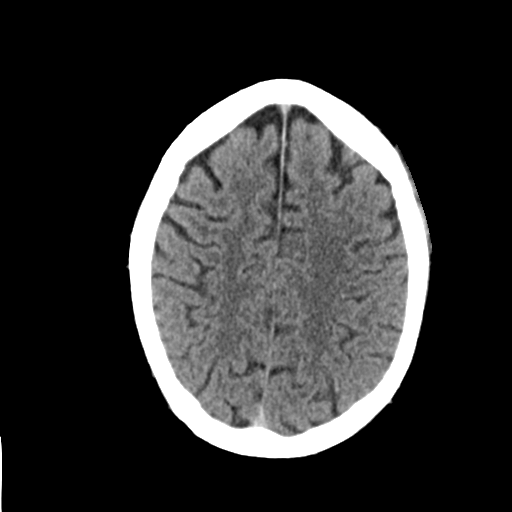
[im 24/32  brain]
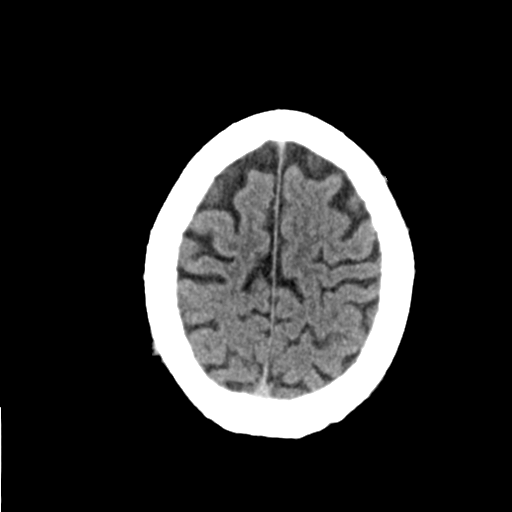
[im 26/32  brain]
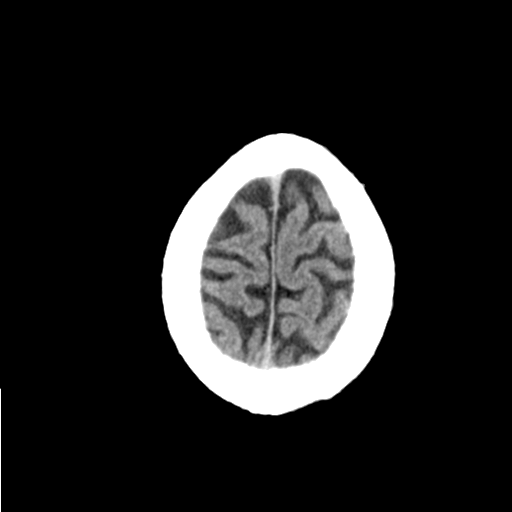
[im 26/32  bone]
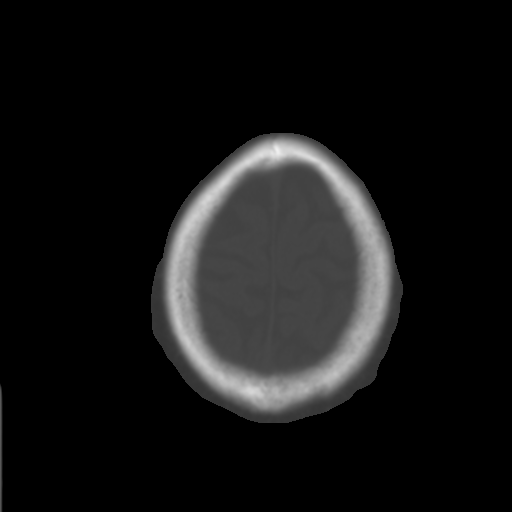
[im 29/32  brain]
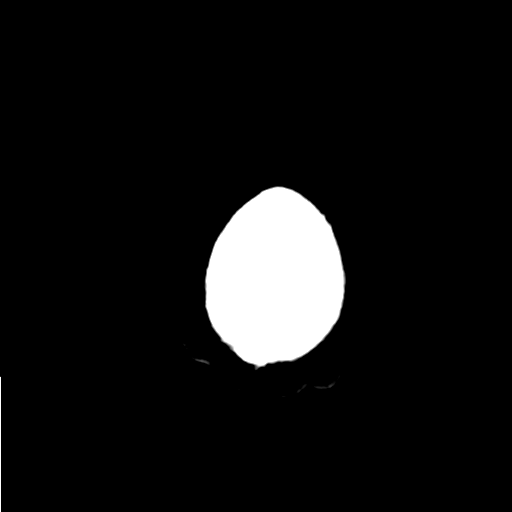

[Series 4: coronal soft tissue · coronal · 0.32mm/px · 3 of 66 slices shown]
[im 22/66  brain]
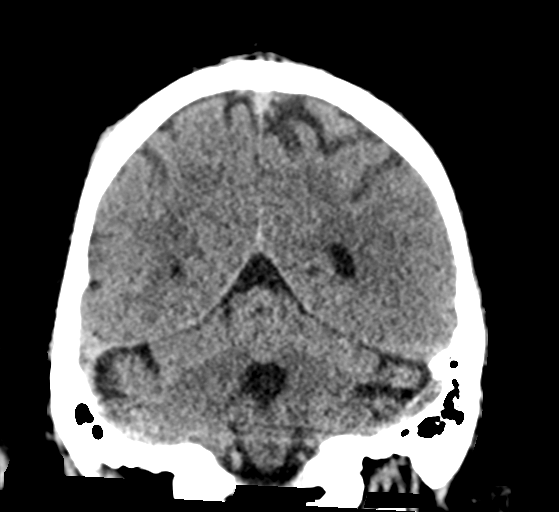
[im 29/66  brain]
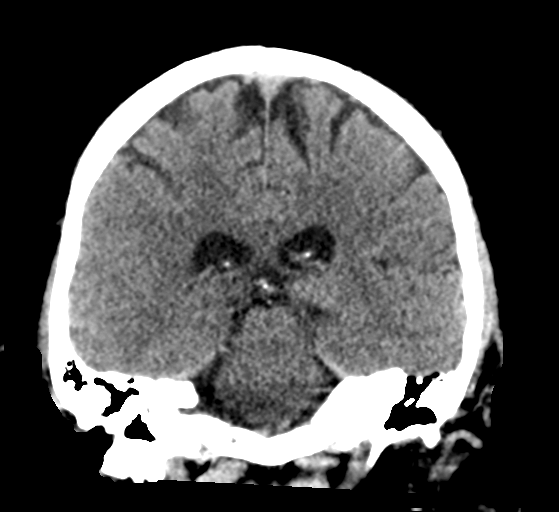
[im 37/66  brain]
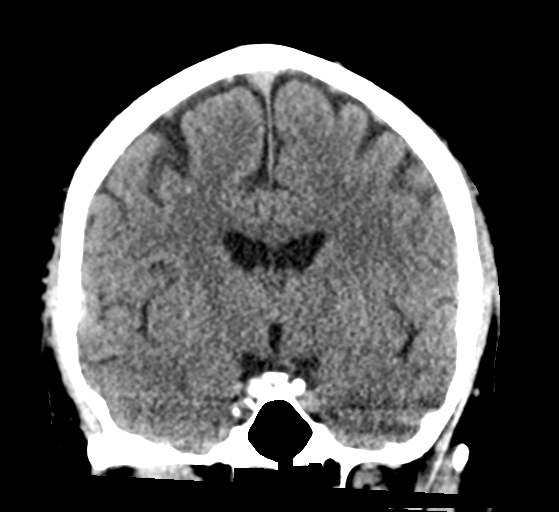

[Series 5: sagittal soft tissue · sagittal · 0.36mm/px · 3 of 57 slices shown]
[im 19/57  brain]
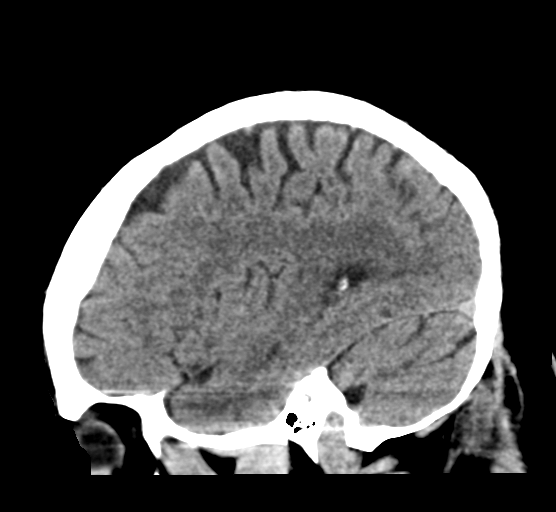
[im 29/57  brain]
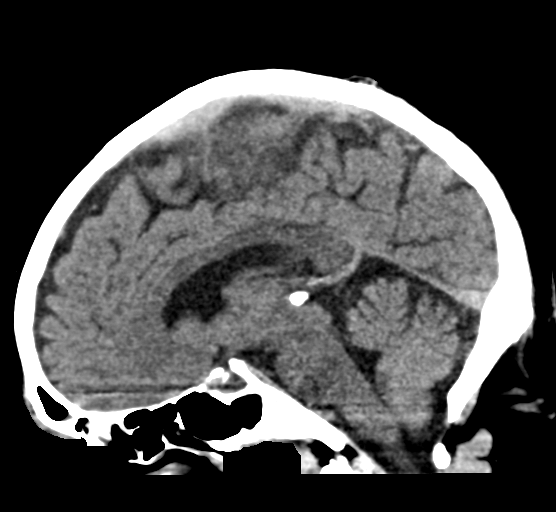
[im 38/57  brain]
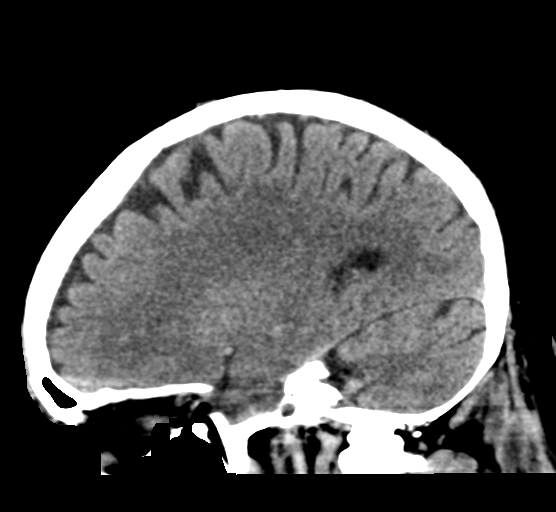

[16 of 47 positions shown; findings below may reference images not displayed]

FINDINGS: Brain: Ventricles, cisterns and other CSF spaces are within normal.
There is no mass, mass effect, shift of midline structures or acute
hemorrhage. No evidence of acute infarction.

Vascular: No hyperdense vessel or unexpected calcification.

Skull: Normal. Negative for fracture or focal lesion.

Sinuses/Orbits: No acute finding.

Other: None.
IMPRESSION: No acute intracranial findings.

## 2016-12-10 MED ORDER — CEPHALEXIN 500 MG PO CAPS: 500.0000 mg | ORAL_CAPSULE | Freq: Three times a day (TID) | ORAL | 0 refills | Status: AC

## 2016-12-10 MED ORDER — ACETAMINOPHEN 325 MG PO TABS
ORAL_TABLET | ORAL | Status: AC
  Filled 2016-12-10: qty 2

## 2016-12-10 MED ORDER — HYDROCODONE-ACETAMINOPHEN 5-325 MG PO TABS
ORAL_TABLET | ORAL | Status: AC
  Filled 2016-12-10: qty 1

## 2016-12-10 MED ORDER — TETANUS-DIPHTH-ACELL PERTUSSIS 5-2.5-18.5 LF-MCG/0.5 IM SUSP
INTRAMUSCULAR | Status: AC
  Administered 2016-12-10: 0.5 mL via INTRAMUSCULAR
  Filled 2016-12-10: qty 0.5

## 2016-12-10 MED ORDER — SODIUM CHLORIDE 0.9 % IV BOLUS (SEPSIS)
1000.0000 mL | Freq: Once | INTRAVENOUS | Status: AC
Start: 2016-12-10 — End: 2016-12-10
  Administered 2016-12-10: 1000 mL via INTRAVENOUS

## 2016-12-10 MED ORDER — VITAMIN B-1 100 MG PO TABS
500.0000 mg | ORAL_TABLET | Freq: Once | ORAL | Status: AC
  Administered 2016-12-10: 500 mg via ORAL
  Filled 2016-12-10 (×2): qty 5

## 2016-12-10 MED ORDER — TETANUS-DIPHTH-ACELL PERTUSSIS 5-2.5-18.5 LF-MCG/0.5 IM SUSP
0.5000 mL | Freq: Once | INTRAMUSCULAR | Status: AC
  Administered 2016-12-10: 0.5 mL via INTRAMUSCULAR

## 2016-12-10 MED ORDER — HYDROCODONE-ACETAMINOPHEN 5-325 MG PO TABS
1.0000 | ORAL_TABLET | Freq: Once | ORAL | Status: AC
  Administered 2016-12-10: 1 via ORAL

## 2016-12-10 MED ORDER — ACETAMINOPHEN 325 MG PO TABS
650.0000 mg | ORAL_TABLET | Freq: Once | ORAL | Status: AC
  Administered 2016-12-10: 650 mg via ORAL

## 2016-12-10 MED ORDER — TRAMADOL HCL 50 MG PO TABS: 50.0000 mg | ORAL_TABLET | Freq: Four times a day (QID) | ORAL | 0 refills | Status: AC | PRN

## 2016-12-10 NOTE — ED Notes (Signed)
Pt alert and oriented X4, active, cooperative, pt in NAD. RR even and unlabored, color WNL.  Pt informed to return if any life threatening symptoms occur.  Discharge and followup instructions reviewed.  

## 2016-12-10 NOTE — ED Provider Notes (Signed)
[facility] Emergency Department Provider Note    First MD Initiated Contact with Patient 12/10/16 1625     (approximate)  I have reviewed the triage vital signs and the nursing notes.   HISTORY  Chief Complaint Loss of Consciousness and Fall    HPI MIKHAI BIENVENUE is a 51 y.o. male presents for evaluation of left hand pain and pain on his face after the patient had a syncopal episode while drinking last night. States he drank a pint of She[place] Stalls. States he stood up from the table and felt lightheaded and try to catch himself against a fireplace and fell and hit his head. Does not remember how long he was out. States he does have a headache and back pain with left hand pain. Denies any numbness or tingling. No nausea or vomiting. No chest pain or shortness of breath. Did not have any palpitations that he can recall before he passed out. Has had fainting spells in the past. Does smoke.   Past Medical History:  Diagnosis Date  . 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 COPD (chronic obstructive pulmonary disease) ([place])   . Diabetes mellitus without complication ([place])    type 2  . Hypertension    Family History  Problem Relation Age of Onset  . Cancer Mother 96       Lung  . Cancer Father        Colon  . Heart disease Father   . Alcohol abuse Father   . Cancer Brother 72       Esophageal  . Diabetes Brother   . Heart disease Brother    Past Surgical History:  Procedure Laterality Date  . APPENDECTOMY    . INCISION AND DRAINAGE PERIRECTAL ABSCESS N/A 02/04/2015   Procedure: IRRIGATION AND DEBRIDEMENT PERIRECTAL ABSCESS;  Surgeon: Marlyce Huge, MD;  Location: [facility] ORS;  Service: General;  Laterality: N/A;  . RECTAL EXAM UNDER ANESTHESIA  02/04/2015   Procedure: RECTAL EXAM UNDER ANESTHESIA;  Surgeon: Marlyce Huge, MD;  Location: [facility] ORS;  Service: General;;   Patient Active Problem List   Diagnosis Date Noted  . Type 2 diabetes  mellitus ([place]) 02/04/2015  . 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 Perianal abscess 02/04/2015  . Perirectal abscess   . Chronic obstructive pulmonary disease ([place]) 01/17/2013  . Acid reflux 01/17/2013  . HLD (hyperlipidemia) 01/17/2013  . Apnea, sleep 01/17/2013  . Testicular hypofunction 01/17/2013  . Allergic state 01/17/2013  . Adiposity 07/06/2011      Prior to Admission medications   Medication Sig Start Date End Date Taking? Authorizing Provider  amoxicillin-clavulanate (AUGMENTIN) 875-125 MG tablet Take 1 tablet by mouth every 12 (twelve) hours. 02/06/15   Marlyce Huge, MD  budesonide-formoterol (SYMBICORT) 80-4.5 MCG/ACT inhaler Inhale 2 puffs into the lungs 2 (two) times daily.    [provider]  cephALEXin (KEFLEX) 500 MG capsule Take 1 capsule (500 mg total) by mouth 3 (three) times daily. 12/10/16 12/20/16  Merlyn Lot, MD  fluconazole (DIFLUCAN) 200 MG tablet Take 1 tablet (200 mg total) by mouth daily. 02/15/15   Hubbard Herminia Warren, MD  lisinopril (PRINIVIL,ZESTRIL) 10 MG tablet Take 10 mg by mouth daily.    [provider]  lisinopril (PRINIVIL,ZESTRIL) 30 MG tablet Take 1 tablet (30 mg total) by mouth daily. 05/05/16 05/05/17  Gregor Hams, MD  omeprazole (PRILOSEC OTC) 20 MG tablet Take 20 mg by mouth 2 (two) times daily.  [provider]  oxyCODONE-acetaminophen (PERCOCET/ROXICET) 5-325 MG tablet Take 1-2 tablets by mouth every 4 (four) hours as needed for moderate pain. 02/06/15   Marlyce Huge, MD  predniSONE (DELTASONE) 20 MG tablet Take 3 tablets (60 mg total) by mouth daily with breakfast. 02/11/15   Schaevitz, Randall An, MD  Testosterone Cypionate 200 MG/ML KIT Inject 200 mg into the muscle every 14 (fourteen) days.    [provider]  tiotropium (SPIRIVA) 18 MCG inhalation capsule Place 18 mcg into inhaler and inhale daily.    [provider]  traMADol (ULTRAM) 50  MG tablet Take 1 tablet (50 mg total) by mouth every 6 (six) hours as needed. 12/10/16 12/10/17  Merlyn Lot, MD    Allergies Glipizide    Social History Social History  Substance Use Topics  . Smoking status: Current Every Day Smoker    Packs/day: 0.50    Years: 34.00    Types: Cigarettes  . Smokeless tobacco: Never Used  . Alcohol use 7.2 oz/week    12 Cans of beer per week     Comment: varies- only drinks on weekend    Review of Systems Patient denies headaches, rhinorrhea, blurry vision, numbness, shortness of breath, chest pain, edema, cough, abdominal pain, nausea, vomiting, diarrhea, dysuria, fevers, rashes or hallucinations unless otherwise stated above in HPI. ____________________________________________   PHYSICAL EXAM:  VITAL SIGNS: Vitals:   12/10/16 1413  BP: 134/75  Pulse: (!) 113  Resp: 18  Temp: 98.7 F (37.1 C)  SpO2: 98%    Constitutional: Alert and oriented. in no acute distress. Eyes: Conjunctivae are normal.  Head: small contusion to left forehead Nose: No congestion/rhinnorhea.  No septal hematoma, 2cm superficial laceration to bridge of nose Mouth/Throat: Mucous membranes are moist.   Neck: No stridor. Painless ROM.  Cardiovascular: Normal rate, regular rhythm. Grossly normal heart sounds.  Good peripheral circulation. Respiratory: Normal respiratory effort.  No retractions. Lungs CTAB. Gastrointestinal: Soft and nontender. No distention. No abdominal bruits. No CVA tenderness. Genitourinary:  Musculoskeletal:  Left hand with two small superficial abrasion to palm, no pain along flexor tendon sheath, full painless rom.   No lower extremity tenderness nor edema.  No joint effusions. Neurologic:  Normal speech and language. No gross focal neurologic deficits are appreciated. No facial droop Skin:  Skin is warm, dry and intact. No rash noted. Psychiatric: Mood and affect are normal. Speech and behavior are  normal.  ____________________________________________   LABS (all labs ordered are listed, but only abnormal results are displayed)  Results for orders placed or performed during the hospital encounter of 12/10/16 (from the past 24 hour(s))  Troponin I     Status: None   Collection Time: 12/10/16  1:24 PM  Result Value Ref Range   Troponin I <0.03 <0.03 ng/mL  Basic metabolic panel     Status: Abnormal   Collection Time: 12/10/16  2:24 PM  Result Value Ref Range   Sodium 134 (L) 135 - 145 mmol/L   Potassium 4.3 3.5 - 5.1 mmol/L   Chloride 101 101 - 111 mmol/L   CO2 21 (L) 22 - 32 mmol/L   Glucose, Bld 137 (H) 65 - 99 mg/dL   BUN 23 (H) 6 - 20 mg/dL   Creatinine, Ser 1.38 (H) 0.61 - 1.24 mg/dL   Calcium 9.4 8.9 - 10.3 mg/dL   GFR calc non Af Amer 57 (L) >60 mL/min   GFR calc Af Amer >60 >60 mL/min   Anion gap 12 5 -  15  CBC     Status: Abnormal   Collection Time: 12/10/16  2:24 PM  Result Value Ref Range   WBC 9.5 3.8 - 10.6 K/uL   RBC 5.10 4.40 - 5.90 MIL/uL   Hemoglobin 15.0 13.0 - 18.0 g/dL   HCT 06.9 86.1 - 48.3 %   MCV 85.0 80.0 - 100.0 fL   MCH 29.4 26.0 - 34.0 pg   MCHC 34.5 32.0 - 36.0 g/dL   RDW 07.3 (H) 54.3 - 01.4 %   Platelets 228 150 - 440 K/uL  Magnesium     Status: None   Collection Time: 12/10/16  2:24 PM  Result Value Ref Range   Magnesium 1.8 1.7 - 2.4 mg/dL  Ethanol     Status: Abnormal   Collection Time: 12/10/16  2:24 PM  Result Value Ref Range   Alcohol, Ethyl (B) 23 (H) <10 mg/dL  Urinalysis, Complete w Microscopic     Status: Abnormal   Collection Time: 12/10/16  4:30 PM  Result Value Ref Range   Color, Urine YELLOW (A) YELLOW   APPearance CLEAR (A) CLEAR   Specific Gravity, Urine 1.016 1.005 - 1.030   pH 5.0 5.0 - 8.0   Glucose, UA NEGATIVE NEGATIVE mg/dL   Hgb urine dipstick NEGATIVE NEGATIVE   Bilirubin Urine NEGATIVE NEGATIVE   Ketones, ur NEGATIVE NEGATIVE mg/dL   Protein, ur NEGATIVE NEGATIVE mg/dL   Nitrite NEGATIVE NEGATIVE    Leukocytes, UA NEGATIVE NEGATIVE   RBC / HPF 0-5 0 - 5 RBC/hpf   WBC, UA 0-5 0 - 5 WBC/hpf   Bacteria, UA NONE SEEN NONE SEEN   Squamous Epithelial / LPF 0-5 (A) NONE SEEN   Mucus PRESENT    Hyaline Casts, UA PRESENT    ____________________________________________  EKG My review and personal interpretation at Time: 14:18   Indication: fall  Rate: 110  Rhythm: sinus Axis: normal Other: normal intervals, non  Specific st changes, no elevations or depressions to suggest acute ischemia ____________________________________________  RADIOLOGY  I personally reviewed all radiographic images ordered to evaluate for the above acute complaints and reviewed radiology reports and findings.  These findings were personally discussed with the patient.  Please see medical record for radiology report.  ____________________________________________   PROCEDURES  Procedure(s) performed:  Procedures    Critical Care performed: no ____________________________________________   INITIAL IMPRESSION / ASSESSMENT AND PLAN / ED COURSE  Pertinent labs & imaging results that were available during my care of the patient were reviewed by me and considered in my medical decision making (see chart for details).  DDX: dysrhythmia, dehydration, orthostasis, iph, fracture, retained fb  LAWSON MAHONE is a 51 y.o. who presents to the ED with injuries as described above. Patient well-appearing and acute distress. Mild tachycardia blood work is consistent with dehydration likely secondary to extensive alcohol use. No evidence of acute alcohol withdrawal. No evidence of ACS or dysrhythmia that would've caused the fall. Basic wound care provided and patient given updated tetanus. Able to tolerate oral hydration. CT head shows no evidence of acute cranial injury.  Have discussed with the patient and available family all diagnostics and treatments performed thus far and all questions were answered to the best of my  ability. The patient demonstrates understanding and agreement with plan.       ____________________________________________   FINAL CLINICAL IMPRESSION(S) / ED DIAGNOSES  Final diagnoses:  Injury of head, initial encounter  Hand pain, left  Alcohol abuse  Syncope and collapse  NEW MEDICATIONS STARTED DURING THIS VISIT:  New Prescriptions   CEPHALEXIN (KEFLEX) 500 MG CAPSULE    Take 1 capsule (500 mg total) by mouth 3 (three) times daily.   TRAMADOL (ULTRAM) 50 MG TABLET    Take 1 tablet (50 mg total) by mouth every 6 (six) hours as needed.     Note:  This document was prepared using Dragon voice recognition software and may include unintentional dictation errors.    Merlyn Lot, MD 12/10/16 [phone]

## 2016-12-10 NOTE — ED Triage Notes (Signed)
Pt reports wife found him on the floor last night. Pt states he remembers standing up and feeling lightheaded and then doesn't remember anything else. Pt reports prior to episode he had drunk a pint of [place] St[employer]. Pt reports he doesn't drink every day but it is not uncommon for him to drink that much. Pt presents with bruising to left forehead, superficial laceration to bridge of nose, superficial laceration to left hand. Pt reports headache, back pain and left hand and arm pain. Pt alert and oriented in triage.

## 2016-12-10 NOTE — ED Notes (Signed)
Pt got dizzy upon standing last night, fell and hit head on fireplace. Pt c/o back pain since fall. Pt alert and oriented X4, active, cooperative, pt in NAD. RR even and unlabored, color WNL.

## 2017-03-22 ENCOUNTER — Emergency Department: Payer: Managed Care, Other (non HMO)

## 2017-03-22 ENCOUNTER — Other Ambulatory Visit: Payer: Self-pay

## 2017-03-22 ENCOUNTER — Observation Stay
Admission: EM | Admit: 2017-03-22 | Discharge: 2017-03-24 | Disposition: A | Discharge status: Home / Self Care | Payer: Managed Care, Other (non HMO) | Attending: Orthopedic Surgery | Admitting: Orthopedic Surgery

## 2017-03-22 ENCOUNTER — Encounter: Payer: Self-pay | Admitting: *Deleted

## 2017-03-22 DIAGNOSIS — S82899A Other fracture of unspecified lower leg, initial encounter for closed fracture: Secondary | ICD-10-CM | POA: Diagnosis present

## 2017-03-22 DIAGNOSIS — Y9389 Activity, other specified: Secondary | ICD-10-CM | POA: Diagnosis not present

## 2017-03-22 DIAGNOSIS — Z79899 Other long term (current) drug therapy: Secondary | ICD-10-CM | POA: Insufficient documentation

## 2017-03-22 DIAGNOSIS — R05 Cough: Secondary | ICD-10-CM

## 2017-03-22 DIAGNOSIS — E669 Obesity, unspecified: Secondary | ICD-10-CM | POA: Insufficient documentation

## 2017-03-22 DIAGNOSIS — Z6829 Body mass index (BMI) 29.0-29.9, adult: Secondary | ICD-10-CM | POA: Insufficient documentation

## 2017-03-22 DIAGNOSIS — S93432A Sprain of tibiofibular ligament of left ankle, initial encounter: Secondary | ICD-10-CM | POA: Diagnosis not present

## 2017-03-22 DIAGNOSIS — I10 Essential (primary) hypertension: Secondary | ICD-10-CM | POA: Diagnosis not present

## 2017-03-22 DIAGNOSIS — W208XXA Other cause of strike by thrown, projected or falling object, initial encounter: Secondary | ICD-10-CM | POA: Diagnosis not present

## 2017-03-22 DIAGNOSIS — J449 Chronic obstructive pulmonary disease, unspecified: Secondary | ICD-10-CM | POA: Diagnosis not present

## 2017-03-22 DIAGNOSIS — E785 Hyperlipidemia, unspecified: Secondary | ICD-10-CM | POA: Insufficient documentation

## 2017-03-22 DIAGNOSIS — K219 Gastro-esophageal reflux disease without esophagitis: Secondary | ICD-10-CM | POA: Diagnosis not present

## 2017-03-22 DIAGNOSIS — X509XXA Other and unspecified overexertion or strenuous movements or postures, initial encounter: Secondary | ICD-10-CM | POA: Insufficient documentation

## 2017-03-22 DIAGNOSIS — W19XXXA Unspecified fall, initial encounter: Secondary | ICD-10-CM | POA: Diagnosis not present

## 2017-03-22 DIAGNOSIS — Z87442 Personal history of urinary calculi: Secondary | ICD-10-CM | POA: Diagnosis not present

## 2017-03-22 DIAGNOSIS — G4730 Sleep apnea, unspecified: Secondary | ICD-10-CM | POA: Diagnosis not present

## 2017-03-22 DIAGNOSIS — F10920 Alcohol use, unspecified with intoxication, uncomplicated: Secondary | ICD-10-CM | POA: Diagnosis present

## 2017-03-22 DIAGNOSIS — Z8249 Family history of ischemic heart disease and other diseases of the circulatory system: Secondary | ICD-10-CM | POA: Diagnosis not present

## 2017-03-22 DIAGNOSIS — Z811 Family history of alcohol abuse and dependence: Secondary | ICD-10-CM | POA: Insufficient documentation

## 2017-03-22 DIAGNOSIS — F17210 Nicotine dependence, cigarettes, uncomplicated: Secondary | ICD-10-CM | POA: Diagnosis not present

## 2017-03-22 DIAGNOSIS — Z7982 Long term (current) use of aspirin: Secondary | ICD-10-CM | POA: Diagnosis not present

## 2017-03-22 DIAGNOSIS — Z833 Family history of diabetes mellitus: Secondary | ICD-10-CM | POA: Insufficient documentation

## 2017-03-22 DIAGNOSIS — Z800 Family history of malignant neoplasm of digestive organs: Secondary | ICD-10-CM | POA: Insufficient documentation

## 2017-03-22 DIAGNOSIS — S82852A Displaced trimalleolar fracture of left lower leg, initial encounter for closed fracture: Principal | ICD-10-CM | POA: Insufficient documentation

## 2017-03-22 DIAGNOSIS — Z419 Encounter for procedure for purposes other than remedying health state, unspecified: Secondary | ICD-10-CM

## 2017-03-22 DIAGNOSIS — Z888 Allergy status to other drugs, medicaments and biological substances status: Secondary | ICD-10-CM | POA: Insufficient documentation

## 2017-03-22 DIAGNOSIS — R059 Cough, unspecified: Secondary | ICD-10-CM

## 2017-03-22 DIAGNOSIS — E119 Type 2 diabetes mellitus without complications: Secondary | ICD-10-CM | POA: Diagnosis not present

## 2017-03-22 DIAGNOSIS — Z801 Family history of malignant neoplasm of trachea, bronchus and lung: Secondary | ICD-10-CM | POA: Insufficient documentation

## 2017-03-22 DIAGNOSIS — X500XXA Overexertion from strenuous movement or load, initial encounter: Secondary | ICD-10-CM | POA: Diagnosis not present

## 2017-03-22 LAB — CBC
HEMATOCRIT: 42.4 % (ref 40.0–52.0)
HEMOGLOBIN: 13.9 g/dL (ref 13.0–18.0)
MCH: 28.4 pg (ref 26.0–34.0)
MCHC: 32.9 g/dL (ref 32.0–36.0)
MCV: 86.3 fL (ref 80.0–100.0)
Platelets: 252 10*3/uL (ref 150–440)
RBC: 4.91 MIL/uL (ref 4.40–5.90)
RDW: 14.5 % (ref 11.5–14.5)
WBC: 9.5 10*3/uL (ref 3.8–10.6)

## 2017-03-22 LAB — PROTIME-INR
INR: 0.9
Prothrombin Time: 12.1 seconds (ref 11.4–15.2)

## 2017-03-22 LAB — COMPREHENSIVE METABOLIC PANEL WITH GFR
ALT: 45 U/L (ref 17–63)
AST: 31 U/L (ref 15–41)
Albumin: 4 g/dL (ref 3.5–5.0)
Alkaline Phosphatase: 51 U/L (ref 38–126)
Anion gap: 14 (ref 5–15)
BUN: 17 mg/dL (ref 6–20)
CO2: 20 mmol/L — ABNORMAL LOW (ref 22–32)
Calcium: 8.9 mg/dL (ref 8.9–10.3)
Chloride: 99 mmol/L — ABNORMAL LOW (ref 101–111)
Creatinine, Ser: 1.2 mg/dL (ref 0.61–1.24)
GFR calc Af Amer: >60 mL/min
GFR calc non Af Amer: >60 mL/min
Glucose, Bld: 202 mg/dL — ABNORMAL HIGH (ref 65–99)
Potassium: 3.3 mmol/L — ABNORMAL LOW (ref 3.5–5.1)
Sodium: 133 mmol/L — ABNORMAL LOW (ref 135–145)
Total Bilirubin: 0.3 mg/dL (ref 0.3–1.2)
Total Protein: 7 g/dL (ref 6.5–8.1)

## 2017-03-22 LAB — ETHANOL: Alcohol, Ethyl (B): 179 mg/dL — ABNORMAL HIGH (ref <10)

## 2017-03-22 LAB — TYPE AND SCREEN
ABO/RH(D): A POS
Antibody Screen: NEGATIVE

## 2017-03-22 LAB — APTT: APTT: 26 s (ref 24–36)

## 2017-03-22 IMAGING — DX DG ANKLE 2V *L*
2 series · 2 of 2 positions shown · non-contrast
Comparison: None.

CLINICAL DATA: Fall with left ankle deformity. Post reduction of
dislocation.

EXAM:
LEFT ANKLE - 2 VIEW

[ankle ap]
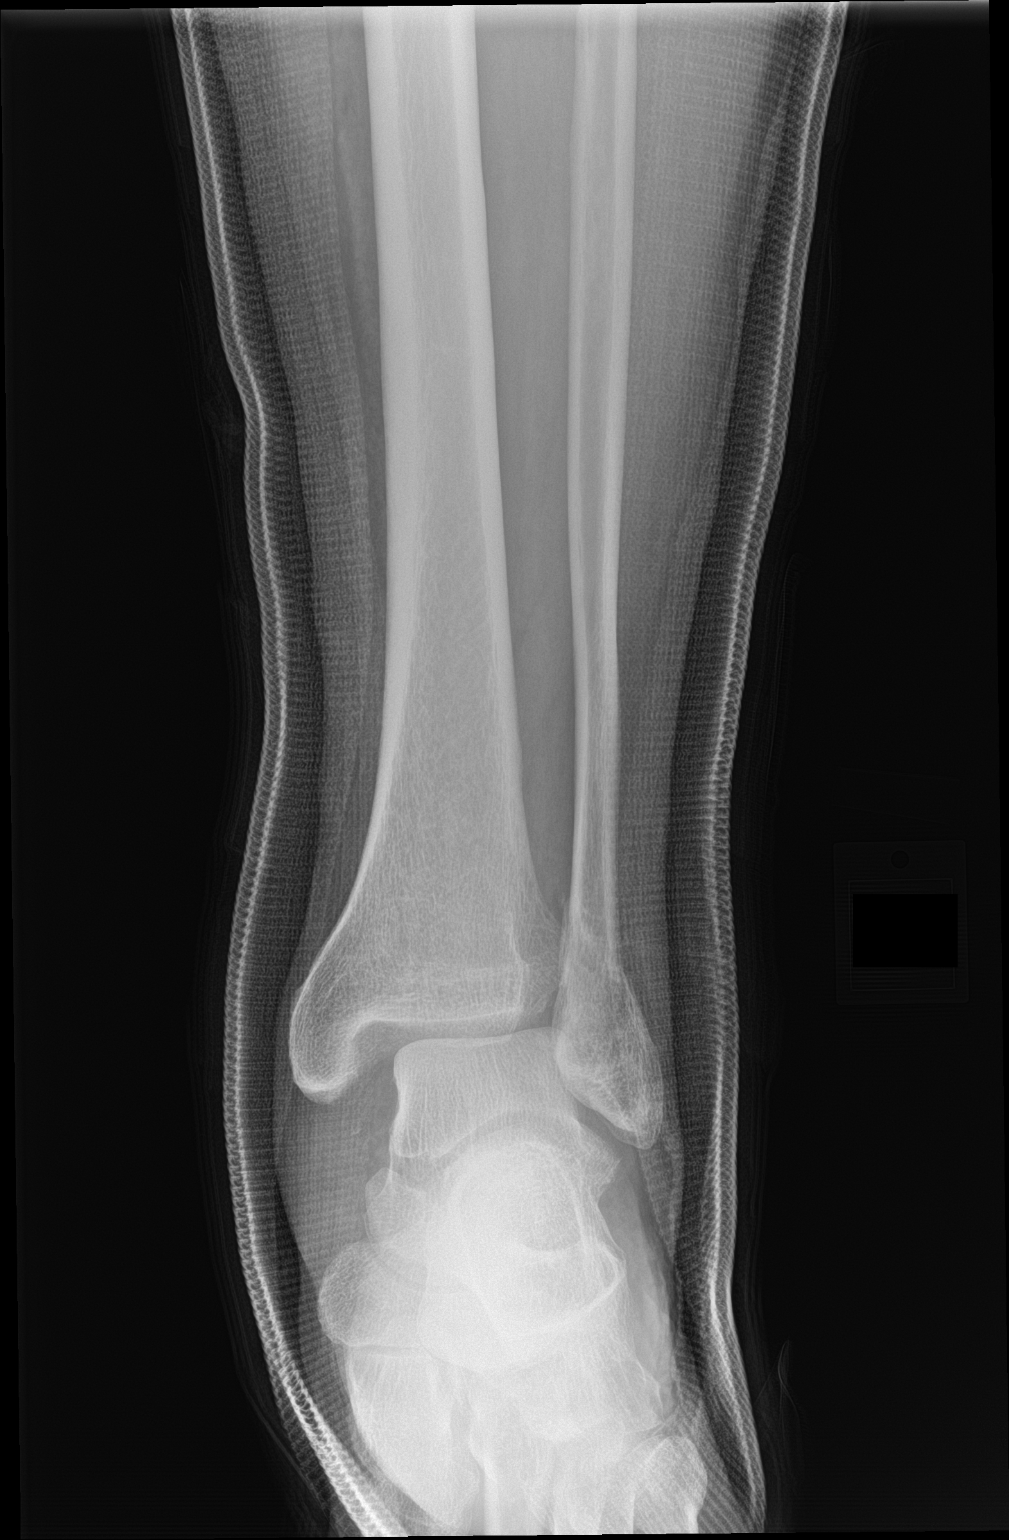

[ankle lat]
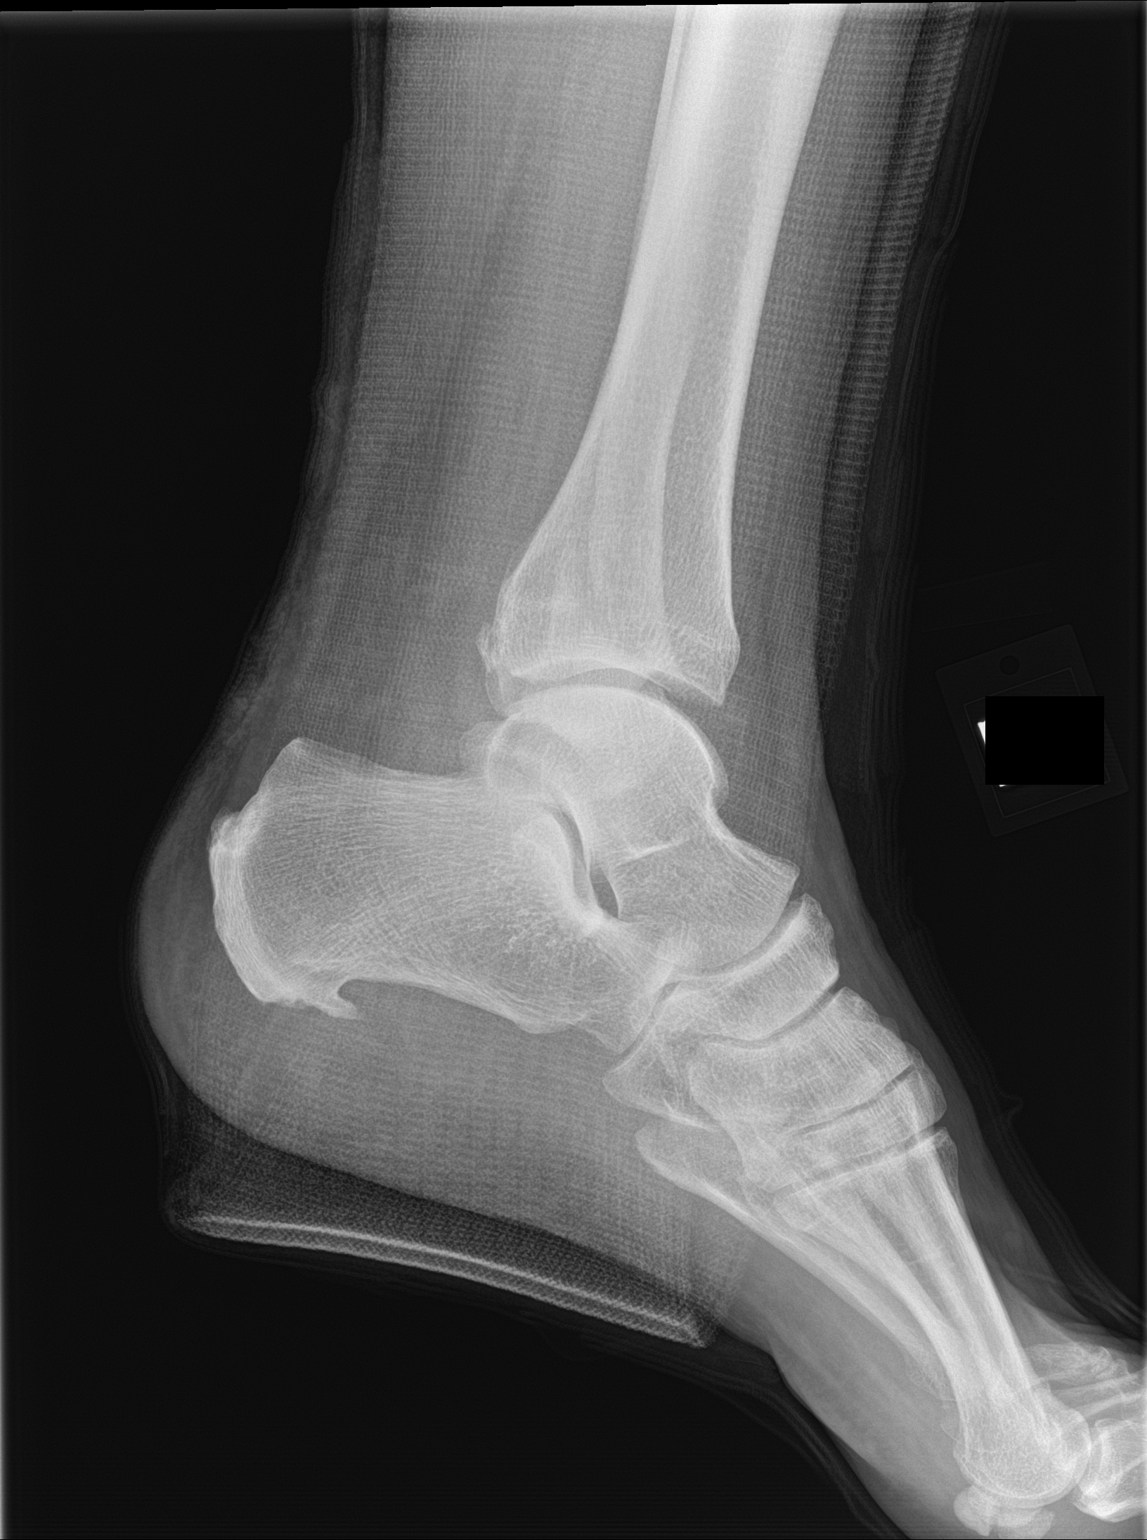

[2 of 2 positions shown; findings below may reference images not displayed]

FINDINGS: Imaging performed with cast material in place limiting osseous and
soft tissue fine detail. Oblique distal fibular fracture at the
level of the ankle mortise. Probable posterior tibial tubercle
fracture. Widening of the medial clear space suggesting ligamentous
injury with possible tiny medial malleolus avulsion. Lateral
subluxation of the talus with respect to the tibial plafond. There
is a plantar calcaneal spur.
IMPRESSION: Mildly displaced distal fibular fracture, probable posterior
malleolar fracture and widening of the medial clear space with
possible avulsion from the medial malleolus. Suspect trimalleolar
injury. Lateral subluxation of the talus with respect to the tibial
plafond.

## 2017-03-22 IMAGING — CT CT ANKLE*L* W/O CM
3 series · 11 of 33 positions shown, 13 images · non-contrast
Comparison: Radiographs earlier this day.

CLINICAL DATA: Left ankle fracture, postreduction.

EXAM:
CT OF THE LEFT ANKLE WITHOUT CONTRAST
TECHNIQUE: Multidetector CT imaging of the left ankle was performed according
to the standard protocol. Multiplanar CT image reconstructions were
also generated.

[Series 8: axial st · axial · 0.17mm/px · z∈[-470,-361]mm · 3 of 178 slices shown, 4 images]
[im 41/178  soft-tissue]
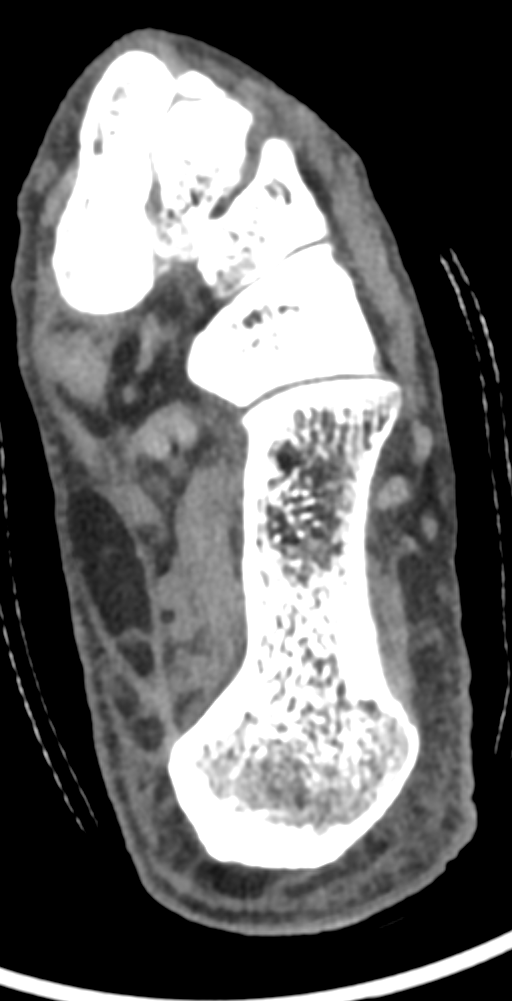
[im 41/178  bone]
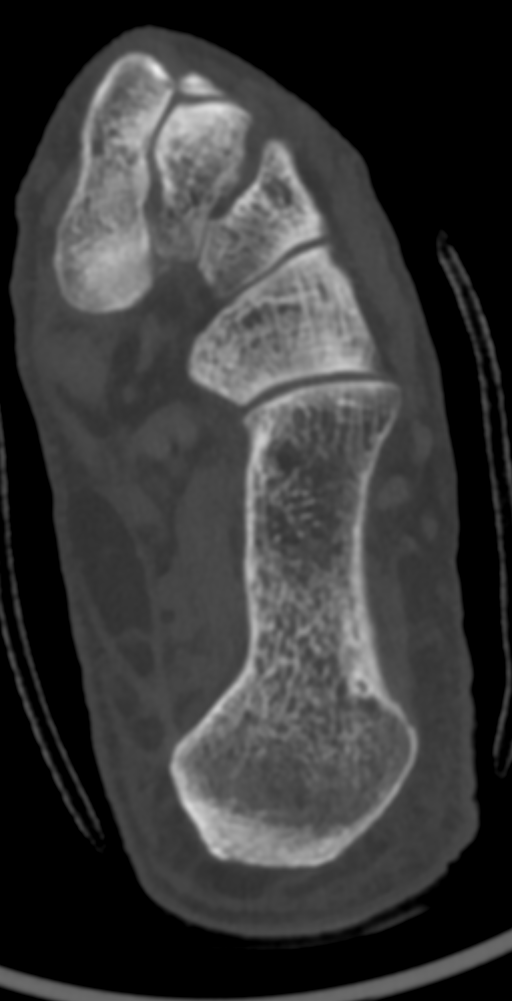
[im 96/178  bone]
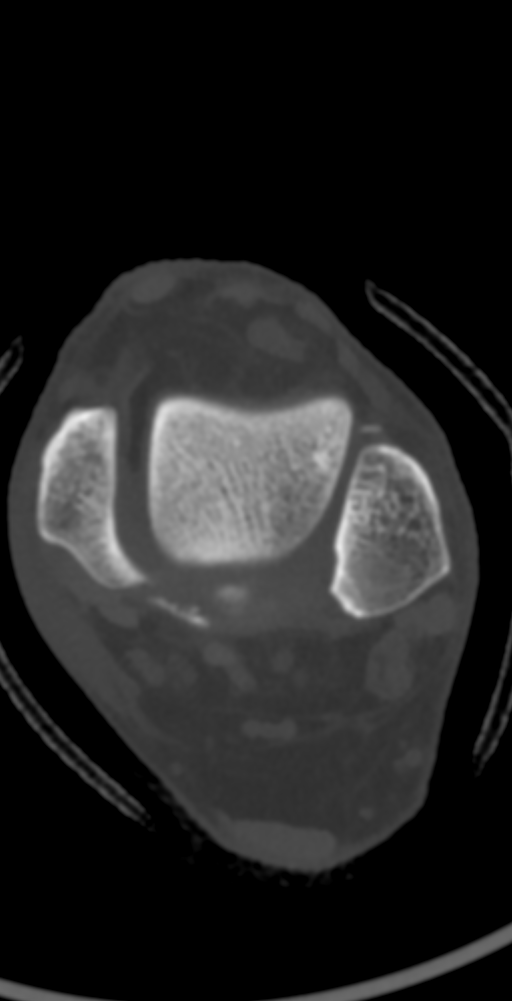
[im 150/178  bone]
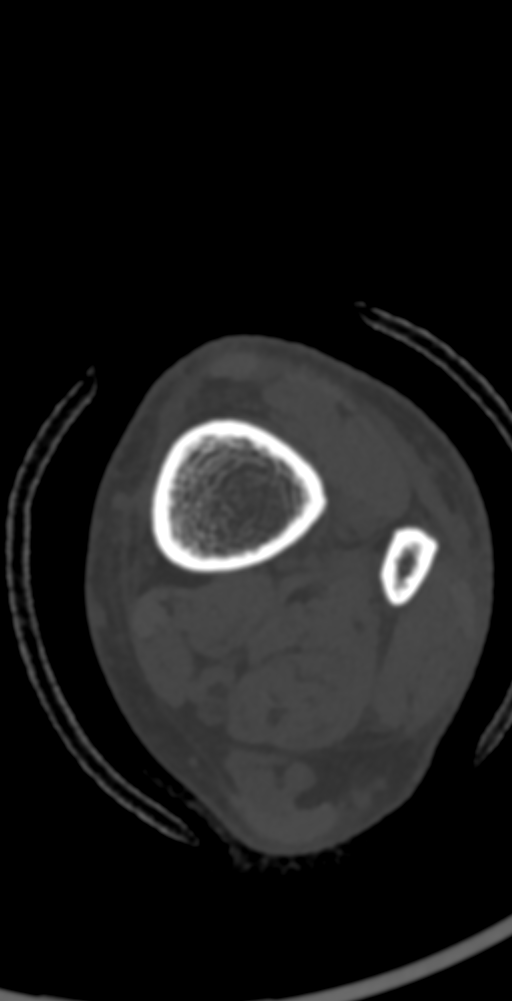

[Series 9: cor st · coronal · 0.17mm/px · 3 of 173 slices shown]
[im 35/173  bone]
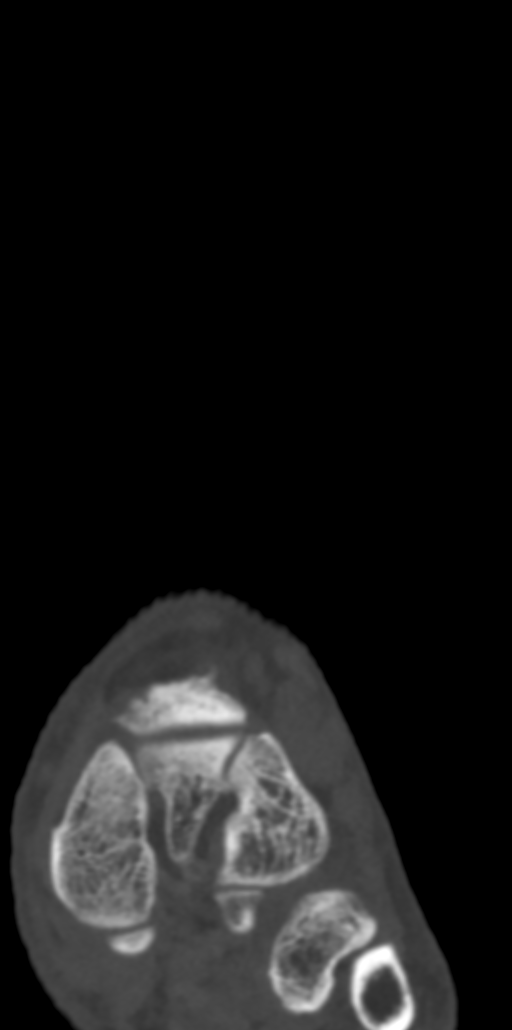
[im 69/173  bone]
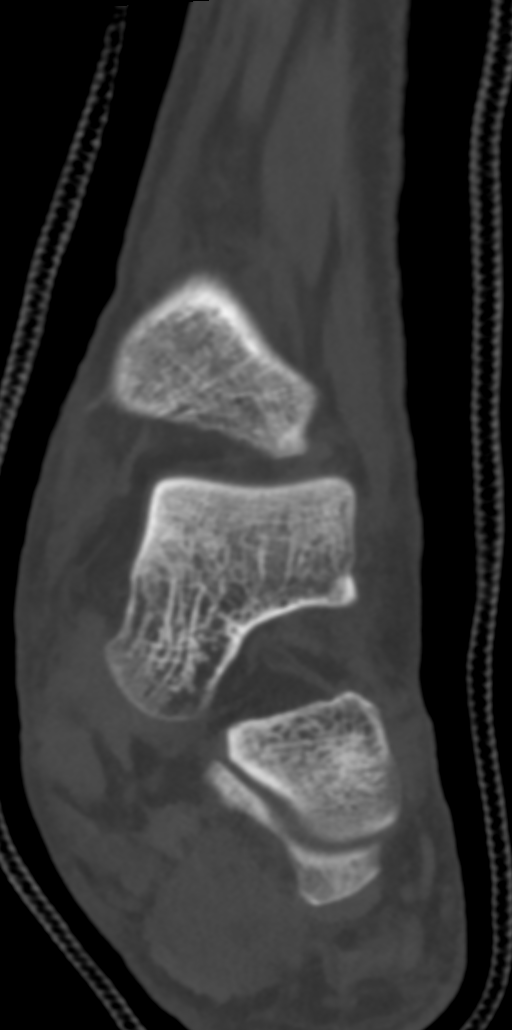
[im 104/173  bone]
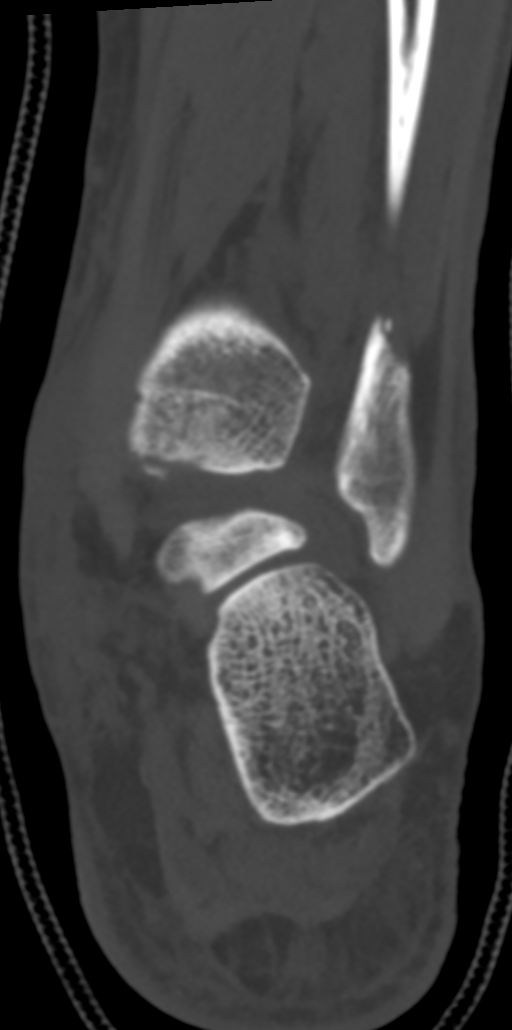

[Series 11: sag st · sagittal · 0.34mm/px · 5 of 89 slices shown, 6 images]
[im 30/89  bone]
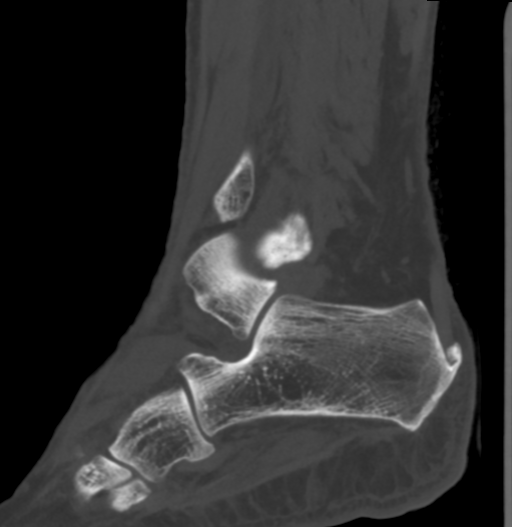
[im 37/89  bone]
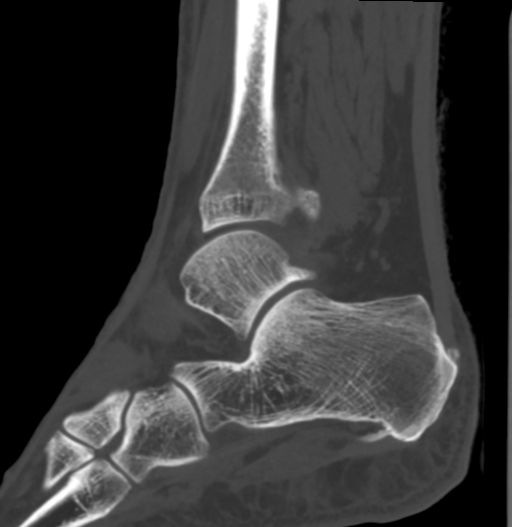
[im 45/89  soft-tissue]
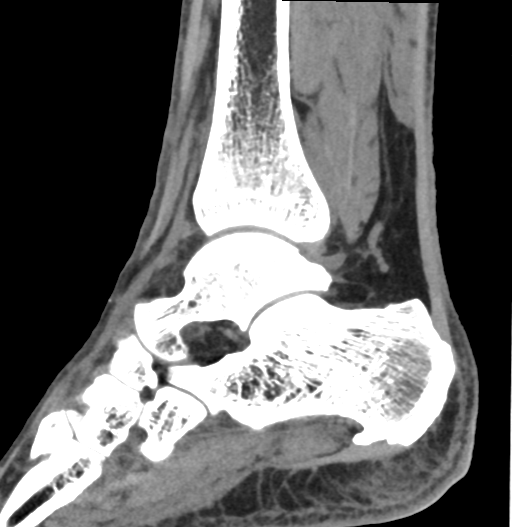
[im 45/89  bone]
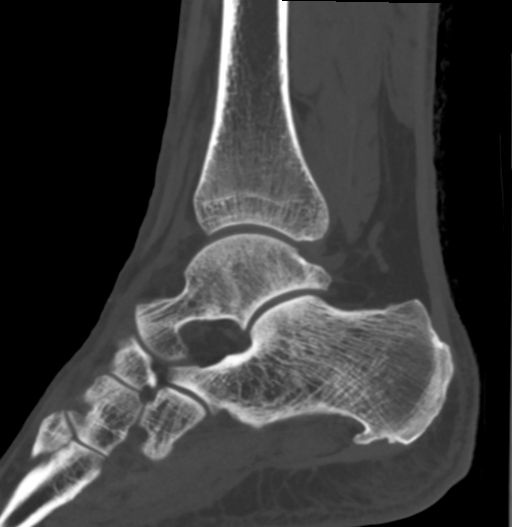
[im 52/89  bone]
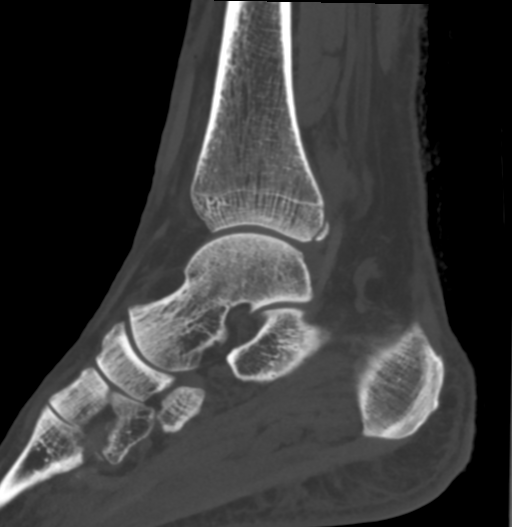
[im 59/89  bone]
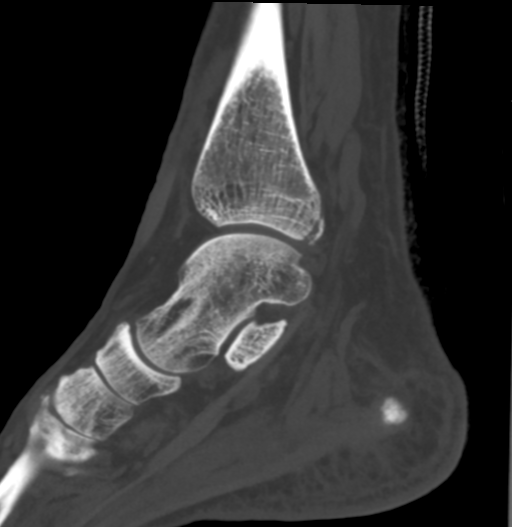

[11 of 33 positions shown; findings below may reference images not displayed]

FINDINGS: Bones/Joint/Cartilage

Oblique distal fibular fracture at and just proximal to the ankle
mortise. Mild displacement and comminution. Posterior malleolar
fracture with small chip fragment, not affecting the weight-bearing
articular surface. Tiny avulsion fracture distal to the medial
malleolus. No tibiotalar joint effusion. Mild widening of the medial
ankle mortise. No additional fracture of the included hindfoot.

Ligaments

Suboptimally assessed by CT.

Muscles and Tendons

No evidence of tendon entrapment. Intact Achilles tendon. No
intramuscular hematoma.

Soft tissues

Subcutaneous soft tissue edema about the ankle.
IMPRESSION: Trimalleolar injury with mildly displaced and comminuted distal
fibular fracture, chip fracture from the posterior malleolus, and
small avulsion fracture from the medial malleolus. Mild widening of
the medial ankle mortise.

## 2017-03-22 IMAGING — DX DG CHEST 1V PORT
1 series · 1 of 1 positions shown · non-contrast
Comparison: [DATE] chest radiograph

CLINICAL DATA: 51 y/o M; cough and left ankle fracture. History of
COPD and hypertension.

EXAM:
PORTABLE CHEST 1 VIEW

[chest ap]
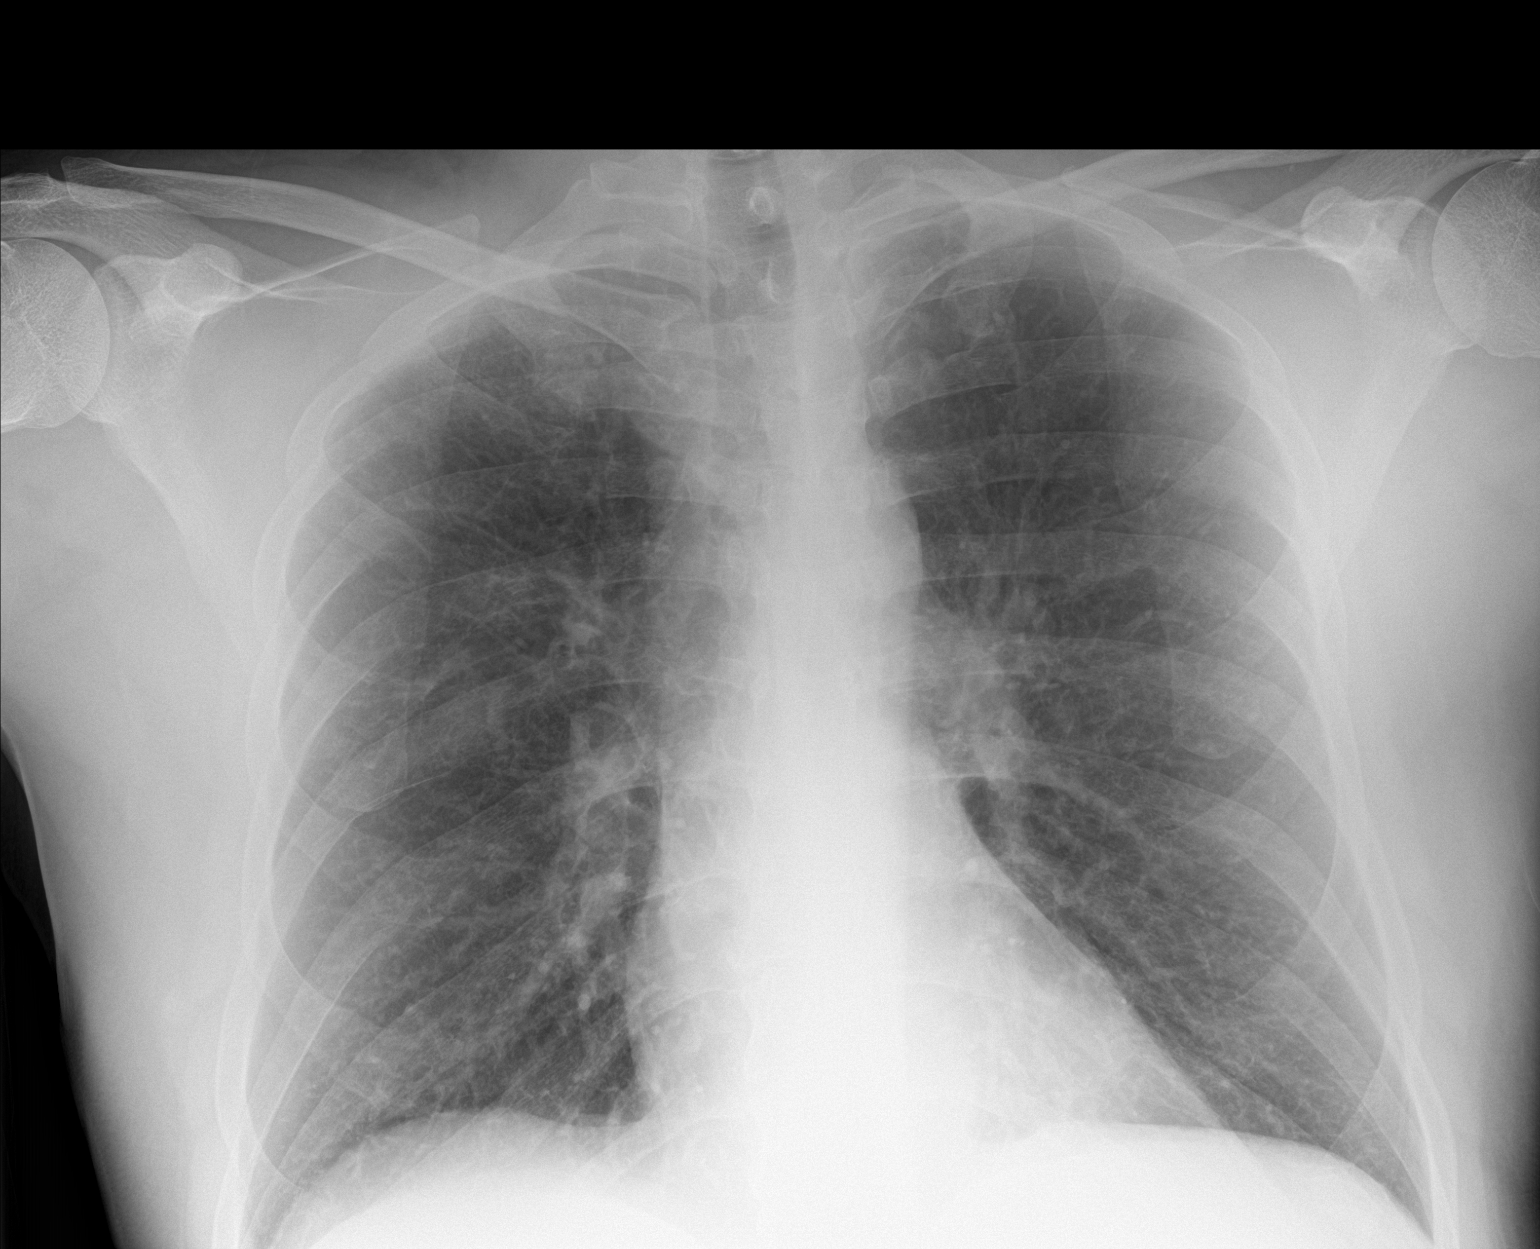

[1 of 1 positions shown; findings below may reference images not displayed]

FINDINGS: Stable normal cardiac silhouette given projection and technique.
Clear lungs. No pleural effusion or pneumothorax. Bones are
unremarkable.
IMPRESSION: No active disease.

By: TIGER M.D.

## 2017-03-22 MED ORDER — SODIUM CHLORIDE 0.9 % IV BOLUS (SEPSIS)
1000.0000 mL | Freq: Once | INTRAVENOUS | Status: AC
  Administered 2017-03-22: 1000 mL via INTRAVENOUS

## 2017-03-22 MED ORDER — HYDROMORPHONE HCL 1 MG/ML IJ SOLN
1.0000 mg | Freq: Once | INTRAMUSCULAR | Status: AC
  Administered 2017-03-22: 1 mg via INTRAVENOUS
  Filled 2017-03-22: qty 1

## 2017-03-22 MED ORDER — FENTANYL CITRATE (PF) 100 MCG/2ML IJ SOLN
INTRAMUSCULAR | Status: AC
  Filled 2017-03-22: qty 2

## 2017-03-22 MED ORDER — FENTANYL CITRATE (PF) 100 MCG/2ML IJ SOLN
50.0000 ug | Freq: Once | INTRAMUSCULAR | Status: AC
  Administered 2017-03-22: 50 ug via INTRAVENOUS

## 2017-03-22 NOTE — ED Triage Notes (Signed)
L ankle dislocated when his son fell on to it. IV # 20 L ac and 100 fentanyl given by EMS

## 2017-03-22 NOTE — ED Provider Notes (Addendum)
[facility] Emergency Department Provider Note  ____________________________________________  Time seen: Approximately 11:28 PM  I have reviewed the triage vital signs and the nursing notes.   HISTORY  Chief Complaint Ankle Injury    HPI Darren Allen is a 52 y.o. male with a history of hypertension, COPD, diabetes, presenting with left ankle deformity and pain, alcohol intoxication.  The patient reports that he was lifting his adult size son to help crack his back, and when he put him down his son lost his balance and landed on his left ankle.  He then proceeded to drink a pint of hard alcohol.  The patient also reports just completing antibiotic treatment for pneumonia, with significant improvement of his cough.  No recent fevers/chills. On arrival to the emergency department, the patient denies any other injuries, is mildly intoxicated, and has a severe deformity with tenting of the skin and obvious dislocation in the left ankle.  Past Medical History:  Diagnosis Date  . 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 COPD (chronic obstructive pulmonary disease) ([place])   . Diabetes mellitus without complication ([place])    type 2  . Hypertension     Patient Active Problem List   Diagnosis Date Noted  . Type 2 diabetes mellitus ([place]) 02/04/2015  . 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 Perianal abscess 02/04/2015  . Perirectal abscess   . Chronic obstructive pulmonary disease ([place]) 01/17/2013  . Acid reflux 01/17/2013  . HLD (hyperlipidemia) 01/17/2013  . Apnea, sleep 01/17/2013  . Testicular hypofunction 01/17/2013  . Allergic state 01/17/2013  . Adiposity 07/06/2011    Past Surgical History:  Procedure Laterality Date  . APPENDECTOMY    . INCISION AND DRAINAGE PERIRECTAL ABSCESS N/A 02/04/2015   Procedure: IRRIGATION AND DEBRIDEMENT PERIRECTAL ABSCESS;  Surgeon: Marlyce Huge, MD;  Location: [facility] ORS;  Service: General;   Laterality: N/A;  . RECTAL EXAM UNDER ANESTHESIA  02/04/2015   Procedure: RECTAL EXAM UNDER ANESTHESIA;  Surgeon: Marlyce Huge, MD;  Location: [facility] ORS;  Service: General;;    Current Outpatient Rx  . Order #: [id number] Class: Print  . Order #: [id number] Class: Historical Med  . Order #: [id number] Class: Normal  . Order #: [id number] Class: Historical Med  . Order #: [id number] Class: Print  . Order #: [id number] Class: Historical Med  . Order #: [id number] Class: Print  . Order #: [id number] Class: Print  . Order #: [id number] Class: Historical Med  . Order #: [id number] Class: Historical Med  . Order #: [id number] Class: Print    Allergies Glipizide  Family History  Problem Relation Age of Onset  . Cancer Mother 91       Lung  . Cancer Father        Colon  . Heart disease Father   . Alcohol abuse Father   . Cancer Brother 14       Esophageal  . Diabetes Brother   . Heart disease Brother     Social History Social History   Tobacco Use  . Smoking status: Current Every Day Smoker    Packs/day: 0.50    Years: 34.00    Pack years: 17.00    Types: Cigarettes  . Smokeless tobacco: Never Used  Substance Use Topics  . Alcohol use: Yes    Alcohol/week: 7.2 oz    Types: 12 Cans of beer per week    Comment: varies- only drinks on weekend  . Drug use: No    Review of Systems Constitutional: No fever/chills.   Eyes: No  visual changes. ENT: No sore throat. No congestion or rhinorrhea. Cardiovascular: Denies chest pain. Denies palpitations. Respiratory: Denies shortness of breath.  No cough. Gastrointestinal: No abdominal pain.  No nausea, no vomiting.  No diarrhea.  No constipation. Genitourinary: Negative for dysuria. Musculoskeletal: Negative for back pain. Skin: Negative for rash. Neurological: Negative for headaches. No focal numbness, tingling or weakness.  Psychiatric:Positive alcohol intoxication  ____________________________________________   PHYSICAL  EXAM:  VITAL SIGNS: ED Triage Vitals  Enc Vitals Group     BP --      Pulse Rate 03/22/17 2209 88     Resp 03/22/17 2209 18     Temp 03/22/17 2209 98.2 F (36.8 C)     Temp Source 03/22/17 2209 Oral     SpO2 03/22/17 2209 99 %     Weight 03/22/17 2211 225 lb (102.1 kg)     Height 03/22/17 2211 6\' 2"  (1.88 m)     Head Circumference --      Peak Flow --      Pain Score 03/22/17 2208 5     Pain Loc --      Pain Edu? --      Excl. in [place]? --     Constitutional: Alert and oriented.  Answers questions appropriately.  Mildly intoxicated.  Nontoxic. Eyes: Conjunctivae are normal.  EOMI. No scleral icterus. Head: Atraumatic. Nose: No congestion/rhinnorhea. Mouth/Throat: Mucous membranes are moist.  Neck: No stridor.  Supple.  Full ROM w/o pain. Cardiovascular: Normal rate Respiratory: Normal respiratory effort.   Musculoskeletal: Obvious deformity of the left ankle with posterior dislocation.  There is tenting of the skin at the distal tibia without skin break.  Normal DP and PT pulses, cap refill is less than 2 seconds. Neurologic:  A&Ox3.  Speech is clear.  Face and smile are symmetric.  EOMI.  Moves all extremities well. Skin:  Skin is warm, dry and intact. No rash noted. Psychiatric: Mood and affect are normal. Speech and behavior are normal.  Normal judgement.  ____________________________________________   LABS (all labs ordered are listed, but only abnormal results are displayed)  Labs Reviewed  CBC  COMPREHENSIVE METABOLIC PANEL  PROTIME-INR  APTT  ETHANOL  INFLUENZA PANEL BY PCR (TYPE A & B)  TYPE AND SCREEN   ____________________________________________  EKG  Not indicated ____________________________________________  RADIOLOGY  Dg Ankle 2 Views Left  Result Date: 03/22/2017 CLINICAL DATA:  Fall with left ankle deformity. Post reduction of dislocation. EXAM: LEFT ANKLE - 2 VIEW COMPARISON:  None. FINDINGS: Imaging performed with cast material in place  limiting osseous and soft tissue fine detail. Oblique distal fibular fracture at the level of the ankle mortise. Probable posterior tibial tubercle fracture. Widening of the medial clear space suggesting ligamentous injury with possible tiny medial malleolus avulsion. Lateral subluxation of the talus with respect to the tibial plafond. There is a plantar calcaneal spur. IMPRESSION: Mildly displaced distal fibular fracture, probable posterior malleolar fracture and widening of the medial clear space with possible avulsion from the medial malleolus. Suspect trimalleolar injury. Lateral subluxation of the talus with respect to the tibial plafond. Electronically Signed   By: Jeb Levering M.D.   On: 03/22/2017 22:25   Dg Chest Portable 1 View  Result Date: 03/22/2017 CLINICAL DATA:  52 y/o M; cough and left ankle fracture. History of COPD and hypertension. EXAM: PORTABLE CHEST 1 VIEW COMPARISON:  04/27/2016 chest radiograph FINDINGS: Stable normal cardiac silhouette given projection and technique. Clear lungs. No pleural effusion or pneumothorax.  Bones are unremarkable. IMPRESSION: No active disease. Electronically Signed   By: Kristine Garbe M.D.   On: 03/22/2017 23:06    ____________________________________________   PROCEDURES  Procedure(s) performed: None  Reduction of dislocation Date/Time: 03/22/2017 11:38 PM Performed by: Eula Listen, MD Authorized by: Eula Listen, MD  Consent: Verbal consent obtained. Risks and benefits: risks, benefits and alternatives were discussed Consent given by: patient Patient understanding: patient states understanding of the procedure being performed Patient consent: the patient's understanding of the procedure matches consent given Procedure consent: procedure consent matches procedure scheduled Relevant documents: relevant documents present and verified Imaging studies: imaging studies not available Patient identity confirmed:  verbally with patient and arm band Time out: Immediately prior to procedure a "time out" was called to verify the correct patient, procedure, equipment, support staff and site/side marked as required. Local anesthesia used: no  Anesthesia: Local anesthesia used: no  Sedation: Patient sedated: no  Patient tolerance: Patient tolerated the procedure well with no immediate complications Comments: Reduction of posterior L ankle dislocation.     Critical Care performed: No ____________________________________________   INITIAL IMPRESSION / ASSESSMENT AND PLAN / ED COURSE  Pertinent labs & imaging results that were available during my care of the patient were reviewed by me and considered in my medical decision making (see chart for details).  52 y.o. male with posterior dislocation of the left ankle, neurovascularly intact, concern for fracture.  In addition the patient has had a cough.  We will get a chest x-ray to rule out pneumonia.  The patient's dislocation was immediately reduced and placed in a splint  He remained active after reduction.  The patient's x-rays were concerning for a trimalleolar fracture, and clinically I am very concerned about this so a CT has been ordered.  The patient's dislocation is reduced on x-ray as well as clinically.  I spoke with Dr. Posey Pronto, the on-call orthopedist who will admit the patient for further evaluation and treatment.  The patient's chest x-ray does not show any pneumonia.  The patient's laboratory studies are pending at the time of his admission.  ____________________________________________  FINAL CLINICAL IMPRESSION(S) / ED DIAGNOSES  Final diagnoses:  Closed trimalleolar fracture of left ankle, initial encounter  Alcoholic intoxication without complication (HCC)  Cough         NEW MEDICATIONS STARTED DURING THIS VISIT:  This SmartLink is deprecated. Use AVSMEDLIST instead to display the medication list for a patient.     Eula Listen, MD 03/22/17 2337    Eula Listen, MD 03/22/17 [phone]

## 2017-03-23 ENCOUNTER — Observation Stay: Payer: Managed Care, Other (non HMO)

## 2017-03-23 ENCOUNTER — Observation Stay: Payer: Managed Care, Other (non HMO) | Admitting: Anesthesiology

## 2017-03-23 ENCOUNTER — Encounter
Admission: EM | Disposition: A | Discharge status: Home / Self Care | Payer: Self-pay | Source: Home / Self Care | Attending: Emergency Medicine

## 2017-03-23 ENCOUNTER — Encounter: Payer: Self-pay | Admitting: Certified Registered Nurse Anesthetist

## 2017-03-23 ENCOUNTER — Other Ambulatory Visit: Payer: Self-pay

## 2017-03-23 DIAGNOSIS — S82899A Other fracture of unspecified lower leg, initial encounter for closed fracture: Secondary | ICD-10-CM | POA: Diagnosis present

## 2017-03-23 HISTORY — PX: SYNDESMOSIS REPAIR: SHX5182

## 2017-03-23 HISTORY — PX: ORIF ANKLE FRACTURE: SHX5408

## 2017-03-23 LAB — GLUCOSE, CAPILLARY
Glucose-Capillary: 116 mg/dL — ABNORMAL HIGH (ref 65–99)
Glucose-Capillary: 222 mg/dL — ABNORMAL HIGH (ref 65–99)
Glucose-Capillary: 404 mg/dL — ABNORMAL HIGH (ref 65–99)

## 2017-03-23 LAB — INFLUENZA PANEL BY PCR (TYPE A & B)
Influenza A By PCR: NEGATIVE
Influenza B By PCR: NEGATIVE

## 2017-03-23 LAB — SURGICAL PCR SCREEN
MRSA, PCR: NEGATIVE
Staphylococcus aureus: NEGATIVE

## 2017-03-23 LAB — POTASSIUM: POTASSIUM: 3.4 mmol/L — AB (ref 3.5–5.1)

## 2017-03-23 IMAGING — CR DG ANKLE 2V *L*
6 series · 6 of 6 positions shown · non-contrast
Comparison: Plain films and CT left ankle [DATE].

CLINICAL DATA: Intraoperative imaging for fixation of a distal
fibular fracture suffered in a fall [DATE]. Initial encounter.

EXAM:
LEFT ANKLE - 2 VIEW; DG C-ARM 1-60 MIN-NO REPORT

[[id] new series (1 of 6)]
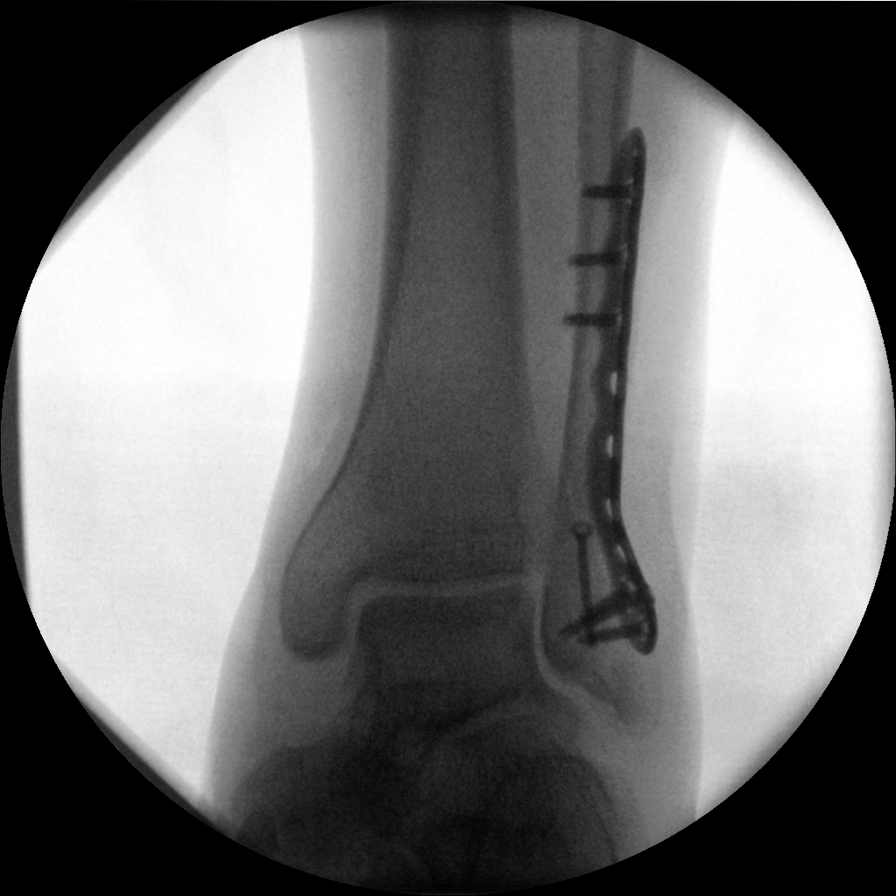

[[id] new series (2 of 6)]
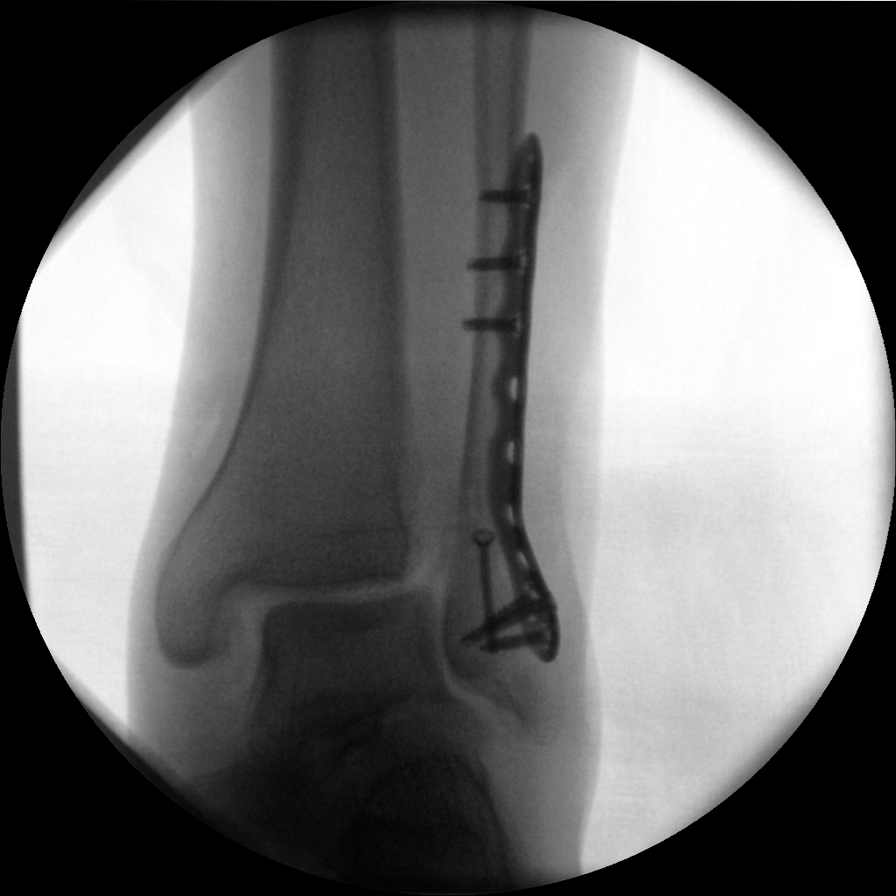

[[id] new series (3 of 6)]
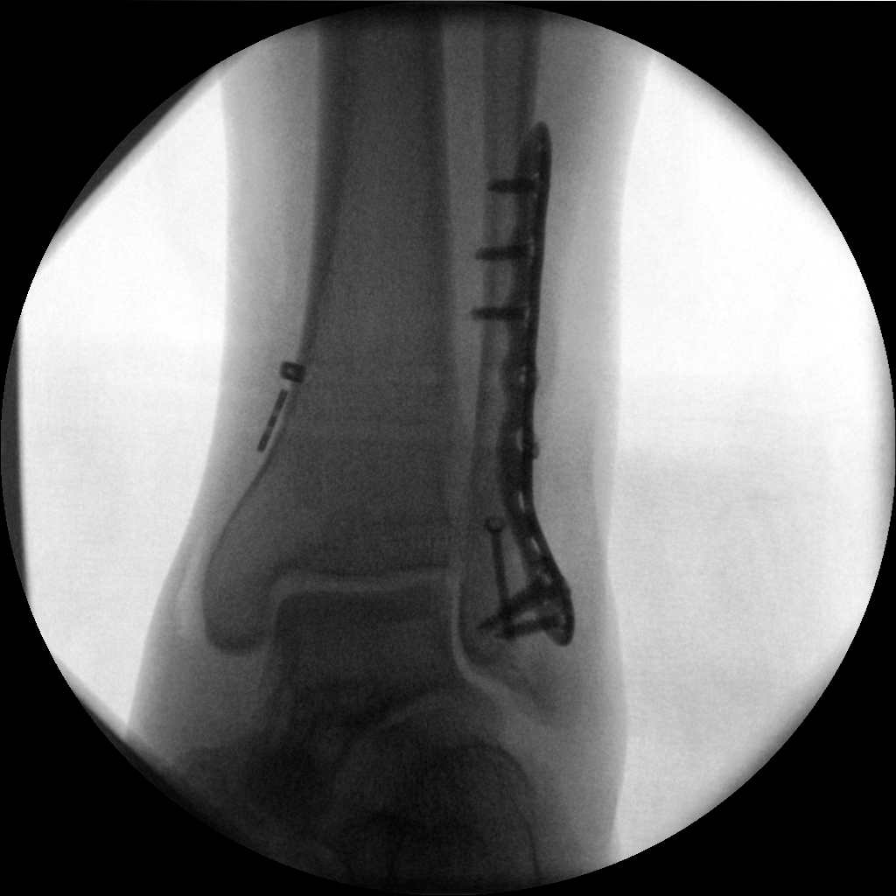

[[id] new series (4 of 6)]
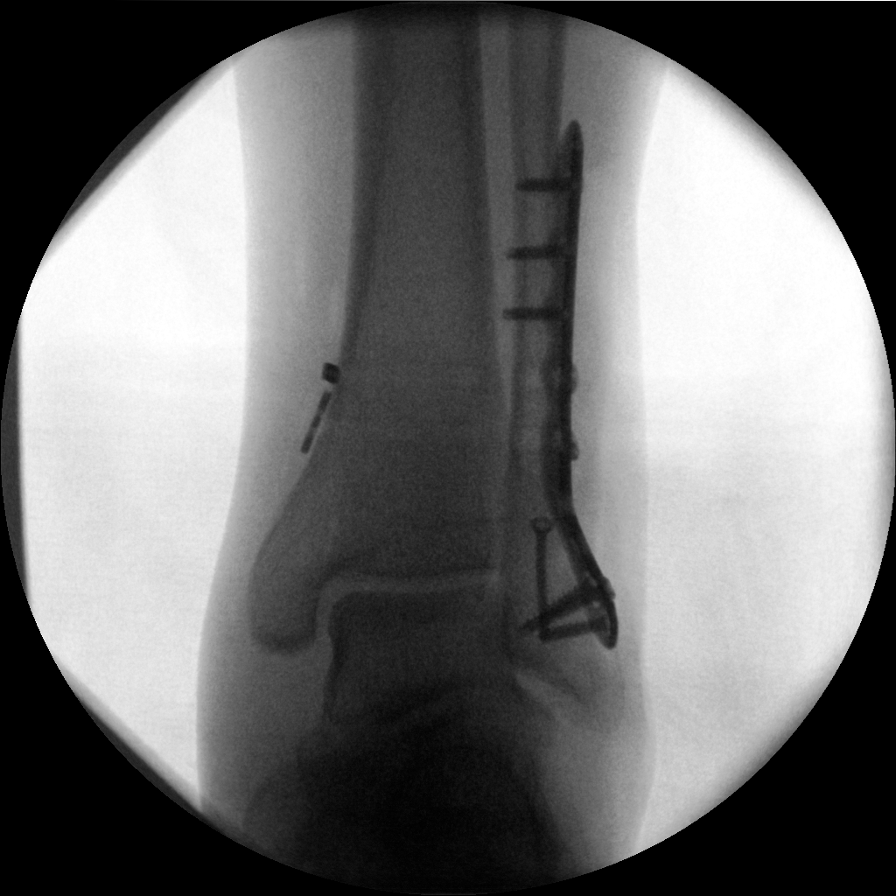

[[id] new series (5 of 6)]
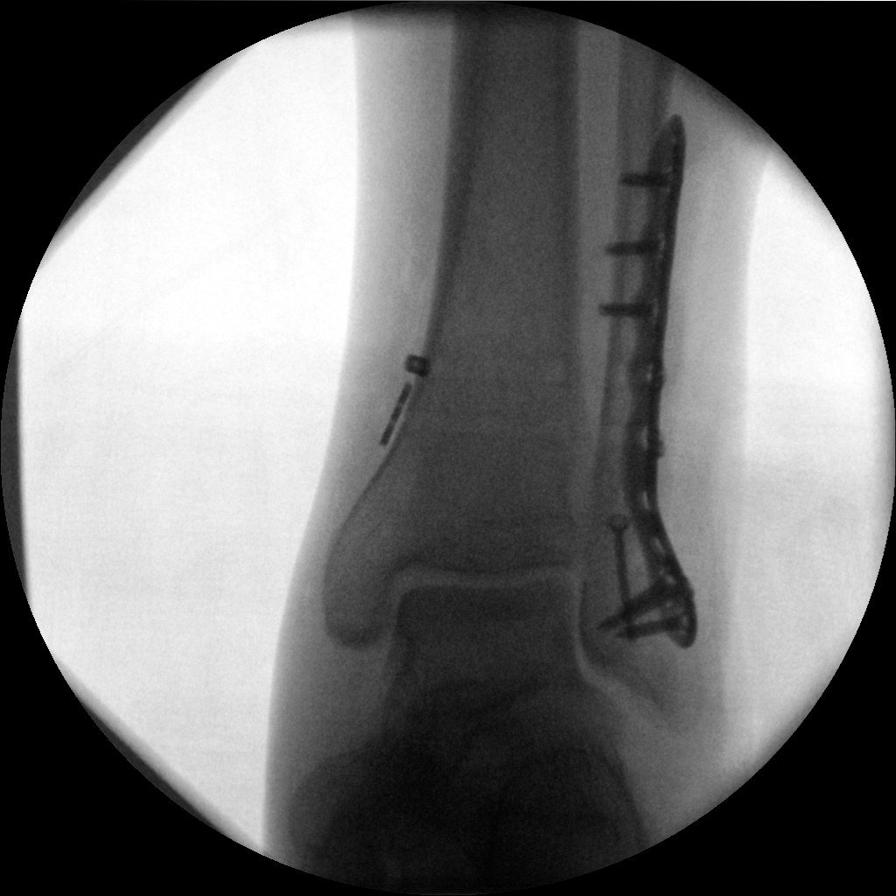

[[id] new series (6 of 6)]
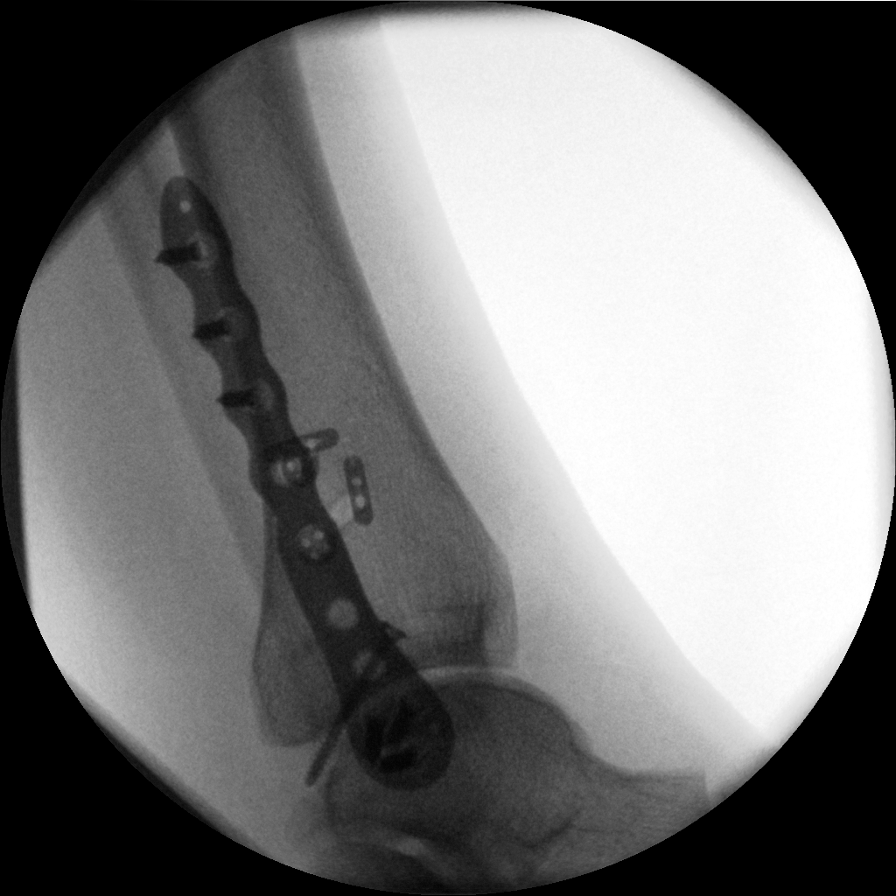

[6 of 6 positions shown; findings below may reference images not displayed]

FINDINGS: Six fluoroscopic spot views of the left ankle are provided. Images
demonstrate lateral plate and screws and an interfragmentary screw
for fixation of a distal fibular fracture. Tracks through the distal
tibia and buttons on the surface of the tibia on the medial side
consistent with syndesmosis repair are also noted.
IMPRESSION: Intraoperative imaging for fixation of a distal fibular fracture and
repair of the syndesmosis.

## 2017-03-23 SURGERY — OPEN REDUCTION INTERNAL FIXATION (ORIF) ANKLE FRACTURE
Anesthesia: General | Site: Ankle | Laterality: Left | Wound class: Clean

## 2017-03-23 MED ORDER — LIDOCAINE HCL (PF) 1 % IJ SOLN
INTRAMUSCULAR | Status: AC
  Filled 2017-03-23: qty 5

## 2017-03-23 MED ORDER — ROPIVACAINE HCL 5 MG/ML IJ SOLN
INTRAMUSCULAR | Status: DC | PRN
  Administered 2017-03-23: 30 mL via PERINEURAL

## 2017-03-23 MED ORDER — IPRATROPIUM-ALBUTEROL 0.5-2.5 (3) MG/3ML IN SOLN: 3.0000 mL | RESPIRATORY_TRACT | Status: DC

## 2017-03-23 MED ORDER — OMEPRAZOLE MAGNESIUM 20 MG PO TBEC: 20.0000 mg | DELAYED_RELEASE_TABLET | Freq: Two times a day (BID) | ORAL | Status: DC

## 2017-03-23 MED ORDER — PROMETHAZINE HCL 25 MG/ML IJ SOLN: 6.2500 mg | INTRAMUSCULAR | Status: DC | PRN

## 2017-03-23 MED ORDER — EPHEDRINE SULFATE 50 MG/ML IJ SOLN
INTRAMUSCULAR | Status: DC | PRN
  Administered 2017-03-23 (×2): 10 mg via INTRAVENOUS

## 2017-03-23 MED ORDER — HYDROMORPHONE HCL 1 MG/ML IJ SOLN
1.0000 mg | Freq: Once | INTRAMUSCULAR | Status: AC
  Administered 2017-03-23: 1 mg via INTRAVENOUS
  Filled 2017-03-23: qty 1

## 2017-03-23 MED ORDER — SODIUM CHLORIDE 0.9 % IV SOLN
INTRAVENOUS | Status: DC | PRN
  Administered 2017-03-23: 30 ug/min via INTRAVENOUS

## 2017-03-23 MED ORDER — HYDROCHLOROTHIAZIDE 25 MG PO TABS
25.0000 mg | ORAL_TABLET | Freq: Every day | ORAL | Status: DC
  Administered 2017-03-24: 25 mg via ORAL
  Filled 2017-03-23: qty 1

## 2017-03-23 MED ORDER — IPRATROPIUM-ALBUTEROL 0.5-2.5 (3) MG/3ML IN SOLN
3.0000 mL | Freq: Once | RESPIRATORY_TRACT | Status: AC
  Administered 2017-03-23: 3 mL via RESPIRATORY_TRACT

## 2017-03-23 MED ORDER — HYDROMORPHONE HCL 1 MG/ML IJ SOLN
INTRAMUSCULAR | Status: AC
  Filled 2017-03-23: qty 1

## 2017-03-23 MED ORDER — FENTANYL CITRATE (PF) 100 MCG/2ML IJ SOLN: 25.0000 ug | INTRAMUSCULAR | Status: DC | PRN

## 2017-03-23 MED ORDER — FENTANYL CITRATE (PF) 100 MCG/2ML IJ SOLN
INTRAMUSCULAR | Status: AC
  Filled 2017-03-23: qty 2

## 2017-03-23 MED ORDER — LIDOCAINE HCL (PF) 1 % IJ SOLN
INTRAMUSCULAR | Status: DC | PRN
  Administered 2017-03-23: 5 mL via SUBCUTANEOUS

## 2017-03-23 MED ORDER — INSULIN ASPART 100 UNIT/ML ~~LOC~~ SOLN
12.0000 [IU] | Freq: Once | SUBCUTANEOUS | Status: AC
  Administered 2017-03-23: 12 [IU] via SUBCUTANEOUS
  Filled 2017-03-23: qty 1

## 2017-03-23 MED ORDER — MIDAZOLAM HCL 2 MG/2ML IJ SOLN
INTRAMUSCULAR | Status: DC | PRN
  Administered 2017-03-23: 2 mg via INTRAVENOUS

## 2017-03-23 MED ORDER — HYDROMORPHONE HCL 1 MG/ML IJ SOLN
1.0000 mg | Freq: Once | INTRAMUSCULAR | Status: AC
  Administered 2017-03-23: 1 mg via INTRAVENOUS

## 2017-03-23 MED ORDER — DEXTROSE 5 % IV SOLN
2.0000 g | Freq: Four times a day (QID) | INTRAVENOUS | Status: DC
  Filled 2017-03-23 (×3): qty 20

## 2017-03-23 MED ORDER — MIDAZOLAM HCL 2 MG/2ML IJ SOLN
INTRAMUSCULAR | Status: AC
  Filled 2017-03-23: qty 2

## 2017-03-23 MED ORDER — OXYCODONE HCL 5 MG PO TABS
5.0000 mg | ORAL_TABLET | ORAL | Status: DC | PRN
  Administered 2017-03-23 – 2017-03-24 (×5): 10 mg via ORAL
  Filled 2017-03-23 (×5): qty 2

## 2017-03-23 MED ORDER — ROPIVACAINE HCL 5 MG/ML IJ SOLN
INTRAMUSCULAR | Status: AC
  Filled 2017-03-23: qty 30

## 2017-03-23 MED ORDER — NICOTINE 21 MG/24HR TD PT24
21.0000 mg | MEDICATED_PATCH | Freq: Once | TRANSDERMAL | Status: AC
  Administered 2017-03-23: 21 mg via TRANSDERMAL

## 2017-03-23 MED ORDER — PREDNISONE 20 MG PO TABS
20.0000 mg | ORAL_TABLET | Freq: Two times a day (BID) | ORAL | Status: DC
  Administered 2017-03-23 – 2017-03-24 (×2): 20 mg via ORAL
  Filled 2017-03-23 (×2): qty 1

## 2017-03-23 MED ORDER — DOCUSATE SODIUM 100 MG PO CAPS
100.0000 mg | ORAL_CAPSULE | Freq: Two times a day (BID) | ORAL | Status: DC
  Administered 2017-03-23 – 2017-03-24 (×3): 100 mg via ORAL
  Filled 2017-03-23 (×3): qty 1

## 2017-03-23 MED ORDER — METFORMIN HCL ER 750 MG PO TB24
2000.0000 mg | ORAL_TABLET | Freq: Every day | ORAL | Status: DC
  Filled 2017-03-23: qty 1

## 2017-03-23 MED ORDER — ACETAMINOPHEN 10 MG/ML IV SOLN
INTRAVENOUS | Status: DC | PRN
  Administered 2017-03-23: 1000 mg via INTRAVENOUS

## 2017-03-23 MED ORDER — SODIUM CHLORIDE 0.9 % IV SOLN
INTRAVENOUS | Status: DC
  Administered 2017-03-23: 10:00:00 via INTRAVENOUS

## 2017-03-23 MED ORDER — NICOTINE 21 MG/24HR TD PT24
MEDICATED_PATCH | TRANSDERMAL | Status: AC
  Filled 2017-03-23: qty 1

## 2017-03-23 MED ORDER — CEFAZOLIN SODIUM-DEXTROSE 2-3 GM-%(50ML) IV SOLR
INTRAVENOUS | Status: DC | PRN
  Administered 2017-03-23: 2 g via INTRAVENOUS

## 2017-03-23 MED ORDER — MIDAZOLAM HCL 2 MG/2ML IJ SOLN
1.0000 mg | Freq: Once | INTRAMUSCULAR | Status: AC
  Administered 2017-03-23: 1 mg via INTRAVENOUS
  Filled 2017-03-23: qty 1

## 2017-03-23 MED ORDER — METHOCARBAMOL 500 MG PO TABS
500.0000 mg | ORAL_TABLET | Freq: Four times a day (QID) | ORAL | Status: DC | PRN
  Administered 2017-03-23: 500 mg via ORAL
  Filled 2017-03-23: qty 1

## 2017-03-23 MED ORDER — SENNOSIDES-DOCUSATE SODIUM 8.6-50 MG PO TABS: 1.0000 | ORAL_TABLET | Freq: Every evening | ORAL | Status: DC | PRN

## 2017-03-23 MED ORDER — LIDOCAINE HCL (CARDIAC) 20 MG/ML IV SOLN
INTRAVENOUS | Status: DC | PRN
  Administered 2017-03-23: 100 mg via INTRAVENOUS

## 2017-03-23 MED ORDER — NEOMYCIN-POLYMYXIN B GU 40-200000 IR SOLN
Status: AC
  Filled 2017-03-23: qty 4

## 2017-03-23 MED ORDER — TIOTROPIUM BROMIDE MONOHYDRATE 18 MCG IN CAPS
18.0000 ug | ORAL_CAPSULE | Freq: Every day | RESPIRATORY_TRACT | Status: DC
  Administered 2017-03-23 – 2017-03-24 (×2): 18 ug via RESPIRATORY_TRACT
  Filled 2017-03-23: qty 5

## 2017-03-23 MED ORDER — ACETAMINOPHEN 500 MG PO TABS
1000.0000 mg | ORAL_TABLET | Freq: Three times a day (TID) | ORAL | Status: DC
  Administered 2017-03-23 – 2017-03-24 (×3): 1000 mg via ORAL
  Filled 2017-03-23 (×3): qty 2

## 2017-03-23 MED ORDER — ONDANSETRON HCL 4 MG/2ML IJ SOLN
4.0000 mg | Freq: Four times a day (QID) | INTRAMUSCULAR | Status: DC | PRN
  Administered 2017-03-23: 4 mg via INTRAVENOUS
  Filled 2017-03-23 (×2): qty 2

## 2017-03-23 MED ORDER — BISACODYL 5 MG PO TBEC: 5.0000 mg | DELAYED_RELEASE_TABLET | Freq: Every day | ORAL | Status: DC | PRN

## 2017-03-23 MED ORDER — ONDANSETRON HCL 4 MG/2ML IJ SOLN
INTRAMUSCULAR | Status: AC
  Filled 2017-03-23: qty 2

## 2017-03-23 MED ORDER — INSULIN ASPART 100 UNIT/ML ~~LOC~~ SOLN
0.0000 [IU] | Freq: Three times a day (TID) | SUBCUTANEOUS | Status: DC
  Administered 2017-03-23 – 2017-03-24 (×2): 3 [IU] via SUBCUTANEOUS
  Filled 2017-03-23 (×2): qty 1

## 2017-03-23 MED ORDER — IPRATROPIUM-ALBUTEROL 0.5-2.5 (3) MG/3ML IN SOLN
RESPIRATORY_TRACT | Status: AC
  Administered 2017-03-23: 3 mL via RESPIRATORY_TRACT
  Filled 2017-03-23: qty 3

## 2017-03-23 MED ORDER — IPRATROPIUM-ALBUTEROL 0.5-2.5 (3) MG/3ML IN SOLN
3.0000 mL | RESPIRATORY_TRACT | Status: DC | PRN
  Filled 2017-03-23: qty 3

## 2017-03-23 MED ORDER — LISINOPRIL 20 MG PO TABS
40.0000 mg | ORAL_TABLET | Freq: Every day | ORAL | Status: DC
  Administered 2017-03-24: 40 mg via ORAL
  Filled 2017-03-23: qty 2

## 2017-03-23 MED ORDER — IPRATROPIUM-ALBUTEROL 0.5-2.5 (3) MG/3ML IN SOLN
3.0000 mL | Freq: Four times a day (QID) | RESPIRATORY_TRACT | Status: DC
  Administered 2017-03-23 (×2): 3 mL via RESPIRATORY_TRACT
  Filled 2017-03-23 (×3): qty 3

## 2017-03-23 MED ORDER — ACETAMINOPHEN 10 MG/ML IV SOLN
INTRAVENOUS | Status: AC
  Filled 2017-03-23: qty 100

## 2017-03-23 MED ORDER — FENTANYL CITRATE (PF) 100 MCG/2ML IJ SOLN
INTRAMUSCULAR | Status: DC | PRN
  Administered 2017-03-23 (×2): 25 ug via INTRAVENOUS
  Administered 2017-03-23: 50 ug via INTRAVENOUS

## 2017-03-23 MED ORDER — METHOCARBAMOL 1000 MG/10ML IJ SOLN
500.0000 mg | Freq: Four times a day (QID) | INTRAVENOUS | Status: DC | PRN
  Administered 2017-03-23: 500 mg via INTRAVENOUS
  Filled 2017-03-23: qty 5

## 2017-03-23 MED ORDER — PHENYLEPHRINE HCL 10 MG/ML IJ SOLN
INTRAMUSCULAR | Status: DC | PRN
  Administered 2017-03-23: 200 ug via INTRAVENOUS
  Administered 2017-03-23 (×4): 100 ug via INTRAVENOUS

## 2017-03-23 MED ORDER — MOMETASONE FURO-FORMOTEROL FUM 100-5 MCG/ACT IN AERO
2.0000 | INHALATION_SPRAY | Freq: Two times a day (BID) | RESPIRATORY_TRACT | Status: DC
  Administered 2017-03-23 – 2017-03-24 (×3): 2 via RESPIRATORY_TRACT
  Filled 2017-03-23: qty 8.8

## 2017-03-23 MED ORDER — ONDANSETRON HCL 4 MG/2ML IJ SOLN
INTRAMUSCULAR | Status: DC | PRN
  Administered 2017-03-23: 4 mg via INTRAVENOUS

## 2017-03-23 MED ORDER — METFORMIN HCL ER 500 MG PO TB24
2000.0000 mg | ORAL_TABLET | Freq: Every day | ORAL | Status: DC
  Administered 2017-03-24: 2000 mg via ORAL
  Filled 2017-03-23 (×2): qty 4

## 2017-03-23 MED ORDER — MIDAZOLAM HCL 2 MG/2ML IJ SOLN
INTRAMUSCULAR | Status: AC
  Administered 2017-03-23: 1 mg via INTRAVENOUS
  Filled 2017-03-23: qty 2

## 2017-03-23 MED ORDER — PROPOFOL 10 MG/ML IV BOLUS
INTRAVENOUS | Status: DC | PRN
  Administered 2017-03-23: 200 mg via INTRAVENOUS

## 2017-03-23 MED ORDER — DIPHENHYDRAMINE HCL 12.5 MG/5ML PO ELIX: 12.5000 mg | ORAL_SOLUTION | ORAL | Status: DC | PRN

## 2017-03-23 MED ORDER — ASPIRIN EC 325 MG PO TBEC
325.0000 mg | DELAYED_RELEASE_TABLET | Freq: Every day | ORAL | Status: DC
  Administered 2017-03-24: 325 mg via ORAL
  Filled 2017-03-23: qty 1

## 2017-03-23 MED ORDER — CEFAZOLIN SODIUM-DEXTROSE 2-4 GM/100ML-% IV SOLN
2.0000 g | Freq: Four times a day (QID) | INTRAVENOUS | Status: AC
  Administered 2017-03-23 – 2017-03-24 (×3): 2 g via INTRAVENOUS
  Filled 2017-03-23 (×3): qty 100

## 2017-03-23 MED ORDER — FENTANYL CITRATE (PF) 100 MCG/2ML IJ SOLN
50.0000 ug | Freq: Once | INTRAMUSCULAR | Status: AC
  Administered 2017-03-23: 50 ug via INTRAVENOUS

## 2017-03-23 MED ORDER — ONDANSETRON HCL 4 MG PO TABS: 4.0000 mg | ORAL_TABLET | Freq: Four times a day (QID) | ORAL | Status: DC | PRN

## 2017-03-23 MED ORDER — SODIUM CHLORIDE 0.9 % IR SOLN
Status: DC | PRN
  Administered 2017-03-23: 16:00:00

## 2017-03-23 MED ORDER — PANTOPRAZOLE SODIUM 40 MG PO TBEC
80.0000 mg | DELAYED_RELEASE_TABLET | Freq: Every day | ORAL | Status: DC
  Administered 2017-03-24: 80 mg via ORAL
  Filled 2017-03-23: qty 2

## 2017-03-23 MED ORDER — PROPOFOL 10 MG/ML IV BOLUS
INTRAVENOUS | Status: AC
  Filled 2017-03-23: qty 20

## 2017-03-23 MED ORDER — HYDROMORPHONE HCL 1 MG/ML IJ SOLN
0.5000 mg | INTRAMUSCULAR | Status: DC | PRN
  Administered 2017-03-23 (×2): 0.5 mg via INTRAVENOUS
  Filled 2017-03-23 (×2): qty 1

## 2017-03-23 MED ORDER — DEXAMETHASONE SODIUM PHOSPHATE 10 MG/ML IJ SOLN
INTRAMUSCULAR | Status: DC | PRN
  Administered 2017-03-23: 10 mg via INTRAVENOUS

## 2017-03-23 MED ORDER — FENTANYL CITRATE (PF) 100 MCG/2ML IJ SOLN
INTRAMUSCULAR | Status: AC
  Administered 2017-03-23: 50 ug via INTRAVENOUS
  Filled 2017-03-23: qty 2

## 2017-03-23 MED ORDER — LINAGLIPTIN 5 MG PO TABS
5.0000 mg | ORAL_TABLET | Freq: Every day | ORAL | Status: DC
  Administered 2017-03-24: 5 mg via ORAL
  Filled 2017-03-23 (×2): qty 1

## 2017-03-23 SURGICAL SUPPLY — 57 items
BANDAGE ACE 4X5 VEL STRL LF (GAUZE/BANDAGES/DRESSINGS) ×4 IMPLANT
BANDAGE ACE 6X5 VEL STRL LF (GAUZE/BANDAGES/DRESSINGS) ×4 IMPLANT
BIT DRILL 2.0 (BIT) ×4 IMPLANT
BIT DRILL 2.6 (BIT) ×4 IMPLANT
BLADE SURG 15 STRL LF DISP TIS (BLADE) ×4 IMPLANT
BLADE SURG 15 STRL SS (BLADE) ×4
BLADE SURG SZ10 CARB STEEL (BLADE) ×4 IMPLANT
BNDG COHESIVE 4X5 TAN STRL (GAUZE/BANDAGES/DRESSINGS) IMPLANT
BNDG ESMARK 6X12 TAN STRL LF (GAUZE/BANDAGES/DRESSINGS) ×4 IMPLANT
CANISTER SUCT 1200ML W/VALVE (MISCELLANEOUS) ×4 IMPLANT
CAST PADDING 6X4YD ST 30248 (SOFTGOODS) ×6
CHLORAPREP W/TINT 26ML (MISCELLANEOUS) ×4 IMPLANT
CUFF TOURN 24 STER (MISCELLANEOUS) IMPLANT
CUFF TOURN 30 STER DUAL PORT (MISCELLANEOUS) ×4 IMPLANT
DRAPE C-ARM XRAY 36X54 (DRAPES) ×4 IMPLANT
DRAPE C-ARMOR (DRAPES) ×4 IMPLANT
DRAPE U-SHAPE 47X51 STRL (DRAPES) ×4 IMPLANT
DRILL OVER 2.7X220 (BIT) ×4 IMPLANT
ELECT CAUTERY BLADE 6.4 (BLADE) ×4 IMPLANT
ELECT REM PT RETURN 9FT ADLT (ELECTROSURGICAL) ×4
ELECTRODE REM PT RTRN 9FT ADLT (ELECTROSURGICAL) ×2 IMPLANT
GAUZE PETRO XEROFOAM 1X8 (MISCELLANEOUS) ×4 IMPLANT
GAUZE SPONGE 4X4 12PLY STRL (GAUZE/BANDAGES/DRESSINGS) ×4 IMPLANT
GLOVE BIOGEL PI IND STRL 8 (GLOVE) ×2 IMPLANT
GLOVE BIOGEL PI INDICATOR 8 (GLOVE) ×2
GLOVE SURG SYN 7.5  E (GLOVE) ×4
GLOVE SURG SYN 7.5 E (GLOVE) ×4 IMPLANT
GOWN STRL REUS W/ TWL LRG LVL3 (GOWN DISPOSABLE) ×2 IMPLANT
GOWN STRL REUS W/ TWL XL LVL3 (GOWN DISPOSABLE) ×2 IMPLANT
GOWN STRL REUS W/TWL LRG LVL3 (GOWN DISPOSABLE) ×2
GOWN STRL REUS W/TWL XL LVL3 (GOWN DISPOSABLE) ×2
K-WIRE ORTHOPEDIC 1.4X150L (WIRE) ×4
KIT RM TURNOVER STRD PROC AR (KITS) ×4 IMPLANT
KWIRE ORTHOPEDIC 1.4X150L (WIRE) ×2 IMPLANT
NS IRRIG 1000ML POUR BTL (IV SOLUTION) ×4 IMPLANT
PACK EXTREMITY ARMC (MISCELLANEOUS) ×4 IMPLANT
PAD CAST CTTN 4X4 STRL (SOFTGOODS) ×4 IMPLANT
PADDING CAST COTTON 4X4 STRL (SOFTGOODS) ×4
PADDING CAST COTTON 6X4 ST (SOFTGOODS) ×6 IMPLANT
PLATE FIBULA 5H (Plate) ×4 IMPLANT
SCREW BONE 14MMX3.5MM (Screw) ×12 IMPLANT
SCREW BONE 28MMX2.7MM (Screw) ×4 IMPLANT
SCREW BONE 3.5X16MM (Screw) ×4 IMPLANT
SCREW LOCK 20MMX3.5 (Screw) ×4 IMPLANT
SCREW LOCK 3.5X14 (Screw) ×4 IMPLANT
SCREW LOCKING 3.5X16MM (Screw) ×4 IMPLANT
SPLINT FAST PLASTER 5X30 (CAST SUPPLIES)
SPLINT PLASTER CAST FAST 5X30 (CAST SUPPLIES) IMPLANT
SPONGE LAP 18X18 5 PK (GAUZE/BANDAGES/DRESSINGS) ×4 IMPLANT
STOCKINETTE STRL 6IN 960660 (GAUZE/BANDAGES/DRESSINGS) ×4 IMPLANT
SUT ETHILON 3-0 FS-10 30 BLK (SUTURE) ×8
SUT VIC AB 3-0 SH 27 (SUTURE) ×2
SUT VIC AB 3-0 SH 27X BRD (SUTURE) ×2 IMPLANT
SUT VICRYL 3-0 27IN (SUTURE) ×8 IMPLANT
SUTURE EHLN 3-0 FS-10 30 BLK (SUTURE) ×4 IMPLANT
SYNDESMOSIS TIGHTROPE XP (Orthopedic Implant) ×8 IMPLANT
TOWEL OR 17X26 4PK STRL BLUE (TOWEL DISPOSABLE) ×4 IMPLANT

## 2017-03-23 NOTE — Progress Notes (Signed)
Pt returned back to room 141 from PACU, A&O X4, VSS. No complaints of pain, bed in lowest position, call bell in reach and bed alarm on. Family at bedside.

## 2017-03-23 NOTE — Anesthesia Postprocedure Evaluation (Signed)
Anesthesia Post Note  Patient: Eiden A Monnig  Procedure(s) Performed: OPEN REDUCTION INTERNAL FIXATION (ORIF) ANKLE FRACTURE (Left ) SYNDESMOSIS REPAIR (Left Ankle)  Patient location during evaluation: PACU Anesthesia Type: General Level of consciousness: awake and alert Pain management: pain level controlled Vital Signs Assessment: post-procedure vital signs reviewed and stable Respiratory status: spontaneous breathing, nonlabored ventilation, respiratory function stable and patient connected to nasal cannula oxygen Cardiovascular status: blood pressure returned to baseline and stable Postop Assessment: no apparent nausea or vomiting Anesthetic complications: no     Last Vitals:  Vitals:   03/23/17 1739 03/23/17 1815  BP: 113/69 115/72  Pulse: 98 99  Resp: 18 18  Temp: 36.5 C 36.4 C  SpO2: 92% 96%    Last Pain:  Vitals:   03/23/17 1815  TempSrc: Oral  PainSc:                  Alasha Mcguinness S

## 2017-03-23 NOTE — Progress Notes (Signed)
Per shift report and pt, on Jan 8 he was diagnosed with pneumonia and supposed to be on prednisone for 12 days. He is only on his fifth day, he has completed his ABX. MD Leim Fabry notified, Orders received to consult hospitalist. Order placed.

## 2017-03-23 NOTE — Consult Note (Signed)
Patient Demographics  Darren Allen, is a 52 y.o. male   MRN: [id number]   DOB - Jan 24, 1966  Admit Date - 03/22/2017    Outpatient Primary MD for the patient is Olmedo, Guy Begin, MD  Consult requested in the Hospital by Leim Fabry, MD, On 03/23/2017    Reason for consult ; medical management of diabetes, hypertension, COPD HPI 52 year old male patient admitted to the service for left ankle fracture, patient fracture reduced in the emergency room and he is admitted to orthopedic service, possible surgery today afternoon.  We are consulted for medical management.  Patient has been diagnosed with pneumonia recently, finished antibiotics but is in the process of getting tapering dose of prednisone.  Started on steroids last Thursday and was given prescription for 12 days.  Patient does have wheezing now but no hypoxia.  Patient uses Symbicort, albuterol every day. He is n.p.o. at this time.     Past Medical History:  Diagnosis Date  . 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 COPD (chronic obstructive pulmonary disease) ([place])   . Diabetes mellitus without complication ([place])    type 2  . Hypertension       Past Surgical History:  Procedure Laterality Date  . APPENDECTOMY    . INCISION AND DRAINAGE PERIRECTAL ABSCESS N/A 02/04/2015   Procedure: IRRIGATION AND DEBRIDEMENT PERIRECTAL ABSCESS;  Surgeon: Marlyce Huge, MD;  Location: [facility] ORS;  Service: General;  Laterality: N/A;  . RECTAL EXAM UNDER ANESTHESIA  02/04/2015   Procedure: RECTAL EXAM UNDER ANESTHESIA;  Surgeon: Marlyce Huge, MD;  Location: [facility] ORS;  Service: General;;     Chief Complaint  Patient presents with  . Ankle Injury         Review of Systems    In addition to the HPI above,  No Fever-chills, No Headache, No changes with  Vision or hearing, No problems swallowing food or Liquids, No Chest pain, and does have.  Wheezing. No Abdominal pain, No Nausea or Vommitting, Bowel movements are regular, No Blood in stool or Urine, No dysuria, No new skin rashes or bruises, Left ankle pain No new weakness, tingling, numbness in any extremity, No recent weight gain or loss, No polyuria, polydypsia or polyphagia, No significant Mental Stressors.  A full 10 point Review of Systems was done, except as stated above, all other Review of Systems were negative.   Social History Social History   Tobacco Use  . Smoking status: Current Every Day Smoker    Packs/day: 0.50    Years: 34.00    Pack years: 17.00    Types: Cigarettes  . Smokeless tobacco: Never Used  Substance Use Topics  . Alcohol use: Yes    Alcohol/week: 7.2 oz    Types: 12 Cans of beer per week    Comment: varies- only drinks on weekend     Family History Family History  Problem Relation Age of Onset  . Cancer Mother 67       Lung  . Cancer Father  Colon  . Heart disease Father   . Alcohol abuse Father   . Cancer Brother 64       Esophageal  . Diabetes Brother   . Heart disease Brother     Prior to Admission medications   Medication Sig Start Date End Date Taking? Authorizing Provider  azithromycin (ZITHROMAX) 250 MG tablet TK UTD 03/18/17  Yes [provider]  budesonide-formoterol (SYMBICORT) 80-4.5 MCG/ACT inhaler Inhale 2 puffs into the lungs 2 (two) times daily.   Yes [provider]  lisinopril (PRINIVIL,ZESTRIL) 40 MG tablet Take 1 tablet by mouth daily. 02/26/17  Yes [provider]  metFORMIN (GLUCOPHAGE-XR) 500 MG 24 hr tablet Take 4 tablets by mouth daily. 02/26/17  Yes [provider]  nicotine (NICODERM CQ - DOSED IN MG/24 HOURS) 21 mg/24hr patch Place 1 patch onto the skin daily. 06/01/14  Yes [provider]  omeprazole (PRILOSEC OTC) 20 MG tablet Take 20 mg by mouth 2 (two)  times daily.   Yes [provider]  predniSONE (STERAPRED UNI-PAK 48 TAB) 10 MG (48) TBPK tablet TK PO UTD 03/17/17  Yes [provider]  Testosterone Cypionate 200 MG/ML KIT Inject 200 mg into the muscle every 14 (fourteen) days.   Yes [provider]  tiotropium (SPIRIVA) 18 MCG inhalation capsule Place 18 mcg into inhaler and inhale daily.   Yes [provider]  VENTOLIN HFA 108 (90 Base) MCG/ACT inhaler Inhale 2 puffs into the lungs every 6 (six) hours as needed. 03/22/17  Yes [provider]  amoxicillin-clavulanate (AUGMENTIN) 875-125 MG tablet Take 1 tablet by mouth every 12 (twelve) hours. Patient not taking: Reported on 03/23/2017 02/06/15   Marlyce Huge, MD  fluconazole (DIFLUCAN) 200 MG tablet Take 1 tablet (200 mg total) by mouth daily. Patient not taking: Reported on 03/23/2017 02/15/15   Hubbard Robinson, MD  hydrochlorothiazide (HYDRODIURIL) 25 MG tablet Take 1 tablet by mouth daily. 02/26/17   [provider]  JANUVIA 50 MG tablet Take 1 tablet by mouth daily. 02/26/17   [provider]  lisinopril (PRINIVIL,ZESTRIL) 10 MG tablet Take 10 mg by mouth daily.    [provider]  lisinopril (PRINIVIL,ZESTRIL) 30 MG tablet Take 1 tablet (30 mg total) by mouth daily. Patient not taking: Reported on 03/23/2017 05/05/16 05/05/17  Gregor Hams, MD  oxyCODONE-acetaminophen (PERCOCET/ROXICET) 5-325 MG tablet Take 1-2 tablets by mouth every 4 (four) hours as needed for moderate pain. Patient not taking: Reported on 03/23/2017 02/06/15   Marlyce Huge, MD  pantoprazole (PROTONIX) 40 MG tablet Take 2 tablets by mouth daily. 03/03/17   [provider]  predniSONE (DELTASONE) 20 MG tablet Take 3 tablets (60 mg total) by mouth daily with breakfast. Patient not taking: Reported on 03/23/2017 02/11/15   Orbie Pyo, MD  traMADol (ULTRAM) 50 MG tablet Take 1 tablet (50 mg total) by mouth every 6  (six) hours as needed. Patient not taking: Reported on 03/23/2017 12/10/16 12/10/17  Merlyn Lot, MD    Anti-infectives (From admission, onward)   None      Scheduled Meds: . acetaminophen  1,000 mg Oral Q8H  . docusate sodium  100 mg Oral BID  . hydrochlorothiazide  25 mg Oral Daily  . HYDROmorphone      . insulin aspart  0-9 Units Subcutaneous TID WC  . ipratropium-albuterol  3 mL Nebulization Q4H  . linagliptin  5 mg Oral Daily  . lisinopril  40 mg Oral Daily  . metFORMIN  2,000 mg Oral Daily  . mometasone-formoterol  2 puff Inhalation BID  . nicotine  21 mg Transdermal Once  . pantoprazole  80 mg Oral Daily  . tiotropium  18 mcg Inhalation Daily   Continuous Infusions: . sodium chloride    . methocarbamol (ROBAXIN)  IV     PRN Meds:.bisacodyl, diphenhydrAMINE, HYDROmorphone (DILAUDID) injection, methocarbamol **OR** methocarbamol (ROBAXIN)  IV, ondansetron **OR** ondansetron (ZOFRAN) IV, oxyCODONE, senna-docusate  Allergies  Allergen Reactions  . Glipizide Other (See Comments)    Shaky, feel bad    Physical Exam  Vitals  Blood pressure 108/78, pulse 94, temperature 98.6 F (37 C), temperature source Oral, resp. rate 18, height _0  (1.88 m), weight 104.1 kg (229 lb 6.4 oz), SpO2 96 %.   1. General \, awake, lying in bed in NAD,   2. Normal affect and insight, Not Suicidal or Homicidal, Awake Alert, Oriented X 3.  3. No F.N deficits, ALL C.Nerves Intact, Strength 5/5 all 4 extremities, Sensation intact all 4 extremities, Plantars down going.  4. Ears and Eyes appear Normal, Conjunctivae clear, PERRLA. Moist Oral Mucosa.  5. Supple Neck, No JVD, No cervical lymphadenopathy appriciated, No Carotid Bruits.  6. Symmetrical Chest wall movement, patient has bilateral expiratory wheeze.  7. RRR, No Gallops, Rubs or Murmurs, No Parasternal Heave.  8. Positive Bowel Sounds, Abdomen Soft, No tenderness, No organomegaly appriciated,No rebound -guarding or  rigidity.  9.  No Cyanosis, Normal Skin Turgor, No Skin Rash or Bruise.  10. Good muscle tone,  joints appear normal , no effusions, Normal ROM.  11. No Palpable Lymph Nodes in Neck or Axillae   Data Review  CBC Recent Labs  Lab 03/22/17 2251  WBC 9.5  HGB 13.9  HCT 42.4  PLT 252  MCV 86.3  MCH 28.4  MCHC 32.9  RDW 14.5   ------------------------------------------------------------------------------------------------------------------  Chemistries  Recent Labs  Lab 03/22/17 2251  NA 133*  K 3.3*  CL 99*  CO2 20*  GLUCOSE 202*  BUN 17  CREATININE 1.20  CALCIUM 8.9  AST 31  ALT 45  ALKPHOS 51  BILITOT 0.3   ------------------------------------------------------------------------------------------------------------------ estimated creatinine clearance is 91.6 mL/min (by C-G formula based on SCr of 1.2 mg/dL). ------------------------------------------------------------------------------------------------------------------ No results for input(s): TSH, T4TOTAL, T3FREE, THYROIDAB in the last 72 hours.  Invalid input(s): FREET3   Coagulation profile Recent Labs  Lab 03/22/17 2251  INR 0.90   ------------------------------------------------------------------------------------------------------------------- No results for input(s): DDIMER in the last 72 hours. -------------------------------------------------------------------------------------------------------------------  Cardiac Enzymes No results for input(s): CKMB, TROPONINI, MYOGLOBIN in the last 168 hours.  Invalid input(s): CK ------------------------------------------------------------------------------------------------------------------ Invalid input(s): POCBNP   ---------------------------------------------------------------------------------------------------------------  Urinalysis    Component Value Date/Time   COLORURINE YELLOW (A) 12/10/2016 1630   APPEARANCEUR CLEAR (A) 12/10/2016  1630   APPEARANCEUR Hazy 03/15/2012 0110   LABSPEC 1.016 12/10/2016 1630   LABSPEC 1.031 03/15/2012 0110   PHURINE 5.0 12/10/2016 1630   GLUCOSEU NEGATIVE 12/10/2016 1630   GLUCOSEU Negative 03/15/2012 0110   HGBUR NEGATIVE 12/10/2016 1630   BILIRUBINUR NEGATIVE 12/10/2016 1630   BILIRUBINUR Negative 03/15/2012 0110   KETONESUR NEGATIVE 12/10/2016 1630   PROTEINUR NEGATIVE 12/10/2016 1630   NITRITE NEGATIVE 12/10/2016 1630   LEUKOCYTESUR NEGATIVE 12/10/2016 1630   LEUKOCYTESUR Negative 03/15/2012 0110     Imaging results:   Dg Ankle 2 Views Left  Result Date: 03/22/2017 CLINICAL DATA:  Fall with left ankle deformity. Post reduction of dislocation. EXAM: LEFT ANKLE - 2 VIEW COMPARISON:  None. FINDINGS: Imaging  performed with cast material in place limiting osseous and soft tissue fine detail. Oblique distal fibular fracture at the level of the ankle mortise. Probable posterior tibial tubercle fracture. Widening of the medial clear space suggesting ligamentous injury with possible tiny medial malleolus avulsion. Lateral subluxation of the talus with respect to the tibial plafond. There is a plantar calcaneal spur. IMPRESSION: Mildly displaced distal fibular fracture, probable posterior malleolar fracture and widening of the medial clear space with possible avulsion from the medial malleolus. Suspect trimalleolar injury. Lateral subluxation of the talus with respect to the tibial plafond. Electronically Signed   By: Jeb Levering M.D.   On: 03/22/2017 22:25   Ct Ankle Left Wo Contrast  Result Date: 03/23/2017 CLINICAL DATA:  Left ankle fracture, postreduction. EXAM: CT OF THE LEFT ANKLE WITHOUT CONTRAST TECHNIQUE: Multidetector CT imaging of the left ankle was performed according to the standard protocol. Multiplanar CT image reconstructions were also generated. COMPARISON:  Radiographs earlier this day. FINDINGS: Bones/Joint/Cartilage Oblique distal fibular fracture at and just proximal to  the ankle mortise. Mild displacement and comminution. Posterior malleolar fracture with small chip fragment, not affecting the weight-bearing articular surface. Tiny avulsion fracture distal to the medial malleolus. No tibiotalar joint effusion. Mild widening of the medial ankle mortise. No additional fracture of the included hindfoot. Ligaments Suboptimally assessed by CT. Muscles and Tendons No evidence of tendon entrapment. Intact Achilles tendon. No intramuscular hematoma. Soft tissues Subcutaneous soft tissue edema about the ankle. IMPRESSION: Trimalleolar injury with mildly displaced and comminuted distal fibular fracture, chip fracture from the posterior malleolus, and small avulsion fracture from the medial malleolus. Mild widening of the medial ankle mortise. Electronically Signed   By: Jeb Levering M.D.   On: 03/23/2017 00:07   Dg Chest Portable 1 View  Result Date: 03/22/2017 CLINICAL DATA:  52 y/o M; cough and left ankle fracture. History of COPD and hypertension. EXAM: PORTABLE CHEST 1 VIEW COMPARISON:  04/27/2016 chest radiograph FINDINGS: Stable normal cardiac silhouette given projection and technique. Clear lungs. No pleural effusion or pneumothorax. Bones are unremarkable. IMPRESSION: No active disease. Electronically Signed   By: Kristine Garbe M.D.   On: 03/22/2017 23:06       Assessment & Plan  Active Problems:   Ankle fracture    89.  52 year old male with left ankle fracture status post reduction in the emergency room, patient has left trimalleolar ankle fracture, possible surgery later today.  Continue n.p.o., DVT prophylaxis, pain medicines, management as per orthopedic surgery. 2.  COPD: Patient has wheezing and he says that he usually uses Ventolin in the morning.  DuoNeb's every 4 hours while patient n.p.o., resume prednisone in the evening.  3.  Diabetes mellitus type 2: Patient is n.p.o., start sliding scale insulin with coverage.  Resume metformin after  the surgery.  4.  Essential hypertension: Patient on lisinopril.  But strong patient is n.p.o. DVT Prophylaxis Heparin -  Lovenox - SCDs   AM Labs Ordered, also please review Full Orders  Family Communication: Plan discussed with patient   Thank you for the consult, we will follow the patient with you in the Hospital.   Epifanio Lesches M.D on 03/23/2017 at 8:14 AM  Note: This dictation was prepared with Dragon dictation along with smaller phrase technology. Any transcriptional errors that result from this process are unintentional.

## 2017-03-23 NOTE — Op Note (Signed)
Operative Note   SURGERY DATE: 03/23/2017  PRE-OP DIAGNOSIS:  1. Trimalleolar fracture of L ankle 2. Syndesmosis injury to L ankle   POST-OP DIAGNOSIS:  1. Trimalleolar fracture of L ankle 2. Syndesmosis injury to L ankle  PROCEDURE(S): 1. ORIF of trimalleolar L ankle fracture without fixation of posterior malleolus 2. Syndesmosis repair of L ankle  SURGEON: Cato Mulligan, MD   ANESTHESIA: Regional  + Gen  ESTIMATED BLOOD LOSS: 10cc  DRAINS:  None  TOTAL IV FLUIDS: see anesthesia record  TOURNIQUET TIME: 90 minutes  IMPLANTS: Stryker VariAx 5 hole distal fibular plate 3 - 4.0JW Stryker locking screws distally 3 - 3.1mm Stryker cortical screws proximally 1 - 2.40mm Stryker 18mm lag screw 2 - Arthrex Syndesmosis TightRopes  INDICATION(S): The patient is a 52 y.o. male who had a fall yesterday evening and noted immediate ankle pain. Closed reduction was performed by ED staff. Radiographs showed a trimalleolar ankle fracture with lateral displacement of the talus. CT scan confirmed radiographic findings and showed syndesmosis injury.  OPERATIVE FINDINGS: Trimalleolar ankle fracture with syndesmosis injury  OPERATIVE REPORT:   The patient was seen in the Holding Room. The risks, benefits, complications, treatment options, and expected outcomes were discussed with the patient. The risks and potential complications of the problem and purposed treatment include but are not limited to infection, bleeding, pain, stiffness, nerve and vessel injury and complication secondary to the anesthetic. The patient concurred with the proposed plan, giving informed consent.  The site of surgery was properly noted/marked. A peripheral nerve block was administered by the Anesthesia team.  The patient was taken to Operating Room and transferred to the operating room table. A Time Out was held and the patient identity, procedure, and laterality was confirmed. After administration of adequate  anesthesia, the entire lower extremity was prescrubbed with Hibiclens and alcohol, prepped with Chloroprep, and draped in sterile fashion. The patient was given pre-operative IV antibiotics within 30 minutes of the skin incision.  The tourniquet was inflated to 247mmHg after exsanguinating the leg with an Esmarch bandage. A standard distal fibular incision was made with a 15 blade along the lateral aspect of the fibula. Dissection was carried down sharply to the fibula. The fracture site was identified and cleared of any tissue with a combination of dental pick, knife, and curette.  A reduction clamp was placed and the fracture was reduced. It was held in place with a K-wire. Anatomic reduction was confirmed visually and fluoroscopically. A 2.60mm lag screw was placed across the fracture site in standard fashion. The reduction clamp and K-wire were removed and the fracture remained anatomically reduced.   A Stryker VariAx 5-hole Distal Fibula plate was selected after confirming appropriate size and position fluoroscopically. Three locking screws were placed in the distal fragment. Then 3 additional cortical screws were placed in the proximal fragment. Fluoroscopy then confirmed appropriate hardware position and reduction. External rotation stress view was positive. Syndesmosis disruption was also confirmed visually.  Therefore, we turned our attention to they syndesmosis repair. Under fluoroscopy, a 3.75mm drill was used to drill across both the fibula and tibia, confirming appropriate position with fluoroscopy. A syndesmosis tightrope was passed through the far tibial cortex. The medial button was pulled back and flipped with confirmation with fluoroscopy. The button on the lateral side was advanced, tightening the tightrope until it was flush against the bone. Once final tightening was achieved, we performed repeat external rotation stress test. It was improved, however, there was still some gapping  and  increased motion on visual inspection. We repeated this process to place a second tightrope. Repeat external rotation stress view was now normal. There was physiologic movement of the syndesmosis.  The wound was then thoroughly irrigated. The tourniquet was deflated. Hemostasis was achieved with bovie electrocautery. 0 Vicryl sutures were used to close the deep layer over the plate. 3-0 Vicryl was used to close the subdermal layer. 3-0 Nylon was used to close the skin in a horizontal mattress fashion. The wound was dressed with xeroform, fluffs, and cotton guaze.  The leg was then placed in a short leg splint. The patient was awakened from anesthesia without any further complication and transferred to PACU for further recovery.    POST-OPERATIVE PLAN:  Patient will return to the floor. PT/OT in the AM with plan for discharge to home on POD#1. The patient will be NWB for 6-10 weeks. ASA x 4 weeks for DVT ppx. Follow up in 2 weeks as an outpatient. Transition to walking boot at that time. Start PT after 2 week appointment.

## 2017-03-23 NOTE — Progress Notes (Signed)
Pt's CBG tonight=404. Pt had a dinner tray and snacks at shift change. Per pt, he missed all of his scheduled medicines today (pt was NPO for surgery). No oral hypoglycemics scheduled for tonight. Pt expressed concern about his blood sugar level and missing his medicines. Prime doc Dr. Duane Boston paged and made aware, ordered to give one time dose of Novolog 12units [place]. Will administer as ordered.

## 2017-03-23 NOTE — Progress Notes (Signed)
Inpatient Diabetes Program Recommendations  AACE/ADA: New Consensus Statement on Inpatient Glycemic Control (2015)  Target Ranges:  Prepandial:   less than 140 mg/dL      Peak postprandial:   less than 180 mg/dL (1-2 hours)      Critically ill patients:  140 - 180 mg/dL     Review of Glycemic Control  Results for Darren Allen, Darren Allen (MRN [id number]) as of 03/23/2017 11:55  Ref. Range 03/23/2017 11:33  Glucose-Capillary Latest Ref Range: 65 - 99 mg/dL 116 (H)    Diabetes history: Type 2 Outpatient Diabetes medications: Metformin 2000mg  qday, Januvia 50mg  qday Current orders for Inpatient glycemic control: Metformin 2000 mg qday, Tragenta 5mg  qday, Novolog 0-9 units tid  Inpatient Diabetes Program Recommendations:  Per ADA recommendations "consider performing an A1C on all patients with diabetes or hyperglycemia admitted to the hospital if not performed in the prior 3 months".  Gentry Fitz, RN, BA, MHA, CDE Diabetes Coordinator Inpatient Diabetes Program  [phone] (Team Pager) [phone] ([place]) 03/23/2017 11:58 AM

## 2017-03-23 NOTE — Anesthesia Post-op Follow-up Note (Signed)
Anesthesia QCDR form completed.        

## 2017-03-23 NOTE — Anesthesia Preprocedure Evaluation (Signed)
Anesthesia Evaluation  Patient identified by MRN, date of birth, ID band Patient awake    Reviewed: Allergy & Precautions, NPO status , Patient's Chart, lab work & pertinent test results, reviewed documented beta blocker date and time   History of Anesthesia Complications Negative for: history of anesthetic complications  Airway Mallampati: II  TM Distance: >3 FB     Dental  (+) Chipped, Poor Dentition, Dental Advisory Given, Upper Dentures   Pulmonary shortness of breath and with exertion, sleep apnea , COPD,  COPD inhaler, neg recent URI, Current Smoker,           Cardiovascular Exercise Tolerance: Good hypertension, Pt. on medications (-) angina(-) CAD, (-) Past MI, (-) Cardiac Stents and (-) CABG (-) dysrhythmias (-) Valvular Problems/Murmurs     Neuro/Psych negative neurological ROS  negative psych ROS   GI/Hepatic Neg liver ROS, GERD  Medicated and Controlled,  Endo/Other  diabetes  Renal/GU Renal InsufficiencyRenal disease  negative genitourinary   Musculoskeletal   Abdominal   Peds  Hematology negative hematology ROS (+)   Anesthesia Other Findings Past Medical History: 02/04/2015: Calculus of kidney No date: COPD (chronic obstructive pulmonary disease) ([facility]) No date: Diabetes mellitus without complication ([facility])     Comment:  type 2 No date: Hypertension   Reproductive/Obstetrics negative OB ROS                             Anesthesia Physical  Anesthesia Plan  ASA: II  Anesthesia Plan: General   Post-op Pain Management:  Regional for Post-op pain   Induction: Intravenous  PONV Risk Score and Plan: 1 and Ondansetron and Dexamethasone  Airway Management Planned: Oral ETT and LMA  Additional Equipment:   Intra-op Plan:   Post-operative Plan: Extubation in OR  Informed Consent: I have reviewed the patients History and Physical, chart, labs and discussed the  procedure including the risks, benefits and alternatives for the proposed anesthesia with the patient or authorized representative who has indicated his/her understanding and acceptance.     Plan Discussed with: CRNA  Anesthesia Plan Comments:         Anesthesia Quick Evaluation

## 2017-03-23 NOTE — Anesthesia Procedure Notes (Signed)
Procedure Name: LMA Insertion Date/Time: 03/23/2017 1:51 PM Performed by: Eben Burow, CRNA Pre-anesthesia Checklist: Patient identified, Emergency Drugs available, Suction available, Patient being monitored and Timeout performed Patient Re-evaluated:Patient Re-evaluated prior to induction Oxygen Delivery Method: Circle system utilized Preoxygenation: Pre-oxygenation with 100% oxygen Induction Type: IV induction LMA: LMA inserted LMA Size: 5.0 Number of attempts: 1 Placement Confirmation: positive ETCO2 and breath sounds checked- equal and bilateral Tube secured with: Tape Dental Injury: Teeth and Oropharynx as per pre-operative assessment

## 2017-03-23 NOTE — Anesthesia Procedure Notes (Signed)
Anesthesia Regional Block: Popliteal block   Pre-Anesthetic Checklist: ,, timeout performed, Correct Patient, Correct Site, Correct Laterality, Correct Procedure, Correct Position, site marked, Risks and benefits discussed,  Surgical consent,  Pre-op evaluation,  At surgeon's request and post-op pain management  Laterality: Lower and Left  Prep: chloraprep       Needles:  Injection technique: Single-shot  Needle Type: Echogenic Needle     Needle Length: 9cm  Needle Gauge: 22     Additional Needles:   Procedures:,,,, ultrasound used (permanent image in chart),,,,  Narrative:  Start time: 03/23/2017 1:23 PM End time: 03/23/2017 1:28 PM Injection made incrementally with aspirations every 5 mL.  Performed by: Personally  Anesthesiologist: Martha Clan, MD  Additional Notes: Functioning IV was confirmed and monitors were applied.  A echogenic needle was used. Sterile prep,hand hygiene and sterile gloves were used. Minimal sedation used for procedure.   No paresthesia endorsed by patient during the procedure.  Negative aspiration and negative test dose prior to incremental administration of local anesthetic. The patient tolerated the procedure well with no immediate complications.

## 2017-03-23 NOTE — Transfer of Care (Signed)
Immediate Anesthesia Transfer of Care Note  Patient: Darren Allen  Procedure(s) Performed: OPEN REDUCTION INTERNAL FIXATION (ORIF) ANKLE FRACTURE (Left ) SYNDESMOSIS REPAIR (Left Ankle)  Patient Location: PACU  Anesthesia Type:General  Level of Consciousness: sedated and responds to stimulation  Airway & Oxygen Therapy: Patient Spontanous Breathing and Patient connected to face mask oxygen  Post-op Assessment: Report given to RN and Post -op Vital signs reviewed and stable  Post vital signs: Reviewed and stable  Last Vitals:  Vitals:   03/23/17 1333 03/23/17 1635  BP: 111/81 99/62  Pulse: 99 (!) 102  Resp: 14 16  Temp:  36.6 C  SpO2: 98% 97%    Last Pain:  Vitals:   03/23/17 1635  TempSrc:   PainSc: Asleep      Patients Stated Pain Goal: 2 (95/07/22 5750)  Complications: No apparent anesthesia complications

## 2017-03-23 NOTE — H&P (Addendum)
ORTHOPAEDIC H&P  Chief Complaint:   L ankle apin   History of Present Illness: Darren Allen is a 52 y.o. male who was attempting to lift his son last night in order to crack his back when his son landed on his foot and he twisted it.  He noted immediate pain and deformity.  He was unable to bear weight after this injury.  He notes that he did have some alcohol prior to this event.  He immediately called EMS and he was reduced by the emergency room staff prior to x-rays and subsequent imaging in the emergency room showed a trimalleolar ankle fracture with likely syndesmosis injury.  He describes his pain as a throbbing and sharp pain along the lateral aspect of his ankle.  Pain is improved with rest.  Pain is worse with any movement.  It is described as severe in nature.  Of note, he had a recent pneumonia for which she was on antibiotics and prednisone.  He currently states that he feels significantly better as he is finished his antibiotic course.  Chest x-ray in the emergency room did not reveal any pneumonia  Past Medical History:  Diagnosis Date  . 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 COPD (chronic obstructive pulmonary disease) ([place])   . Diabetes mellitus without complication ([place])    type 2  . Hypertension    Past Surgical History:  Procedure Laterality Date  . APPENDECTOMY    . INCISION AND DRAINAGE PERIRECTAL ABSCESS N/A 02/04/2015   Procedure: IRRIGATION AND DEBRIDEMENT PERIRECTAL ABSCESS;  Surgeon: Marlyce Huge, MD;  Location: [facility] ORS;  Service: General;  Laterality: N/A;  . RECTAL EXAM UNDER ANESTHESIA  02/04/2015   Procedure: RECTAL EXAM UNDER ANESTHESIA;  Surgeon: Marlyce Huge, MD;  Location: [facility] ORS;  Service: General;;   Social History   Socioeconomic History  . Marital status: Single    Spouse name: None  . Number of children: None  . Years of education: None  . Highest education  level: None  Social Needs  . Financial resource strain: None  . Food insecurity - worry: None  . Food insecurity - inability: None  . Transportation needs - medical: None  . Transportation needs - non-medical: None  Occupational History  . None  Tobacco Use  . Smoking status: Current Every Day Smoker    Packs/day: 0.50    Years: 34.00    Pack years: 17.00    Types: Cigarettes  . Smokeless tobacco: Never Used  Substance and Sexual Activity  . Alcohol use: Yes    Alcohol/week: 7.2 oz    Types: 12 Cans of beer per week    Comment: varies- only drinks on weekend  . Drug use: No  . Sexual activity: Yes  Other Topics Concern  . None  Social History Narrative  . None   Family History  Problem Relation Age of Onset  . Cancer Mother 37       Lung  . Cancer Father        Colon  . Heart disease Father   . Alcohol abuse Father   . Cancer Brother 22       Esophageal  . Diabetes Brother   . Heart disease Brother    Allergies  Allergen Reactions  . Glipizide Other (See Comments)    Shaky, feel bad   Prior to Admission medications   Medication Sig Start Date End Date Taking? Authorizing Provider  azithromycin (ZITHROMAX) 250 MG tablet TK UTD 03/18/17  Yes  [provider]  budesonide-formoterol (SYMBICORT) 80-4.5 MCG/ACT inhaler Inhale 2 puffs into the lungs 2 (two) times daily.   Yes [provider]  lisinopril (PRINIVIL,ZESTRIL) 40 MG tablet Take 1 tablet by mouth daily. 02/26/17  Yes [provider]  metFORMIN (GLUCOPHAGE-XR) 500 MG 24 hr tablet Take 4 tablets by mouth daily. 02/26/17  Yes [provider]  nicotine (NICODERM CQ - DOSED IN MG/24 HOURS) 21 mg/24hr patch Place 1 patch onto the skin daily. 06/01/14  Yes [provider]  omeprazole (PRILOSEC OTC) 20 MG tablet Take 20 mg by mouth 2 (two) times daily.   Yes [provider]  predniSONE (STERAPRED UNI-PAK 48 TAB) 10 MG (48) TBPK tablet TK PO UTD 03/17/17  Yes [provider]  Testosterone Cypionate 200 MG/ML KIT Inject 200 mg into the muscle every 14 (fourteen) days.   Yes [provider]  tiotropium (SPIRIVA) 18 MCG inhalation capsule Place 18 mcg into inhaler and inhale daily.   Yes [provider]  VENTOLIN HFA 108 (90 Base) MCG/ACT inhaler Inhale 2 puffs into the lungs every 6 (six) hours as needed. 03/22/17  Yes [provider]  amoxicillin-clavulanate (AUGMENTIN) 875-125 MG tablet Take 1 tablet by mouth every 12 (twelve) hours. Patient not taking: Reported on 03/23/2017 02/06/15   Marlyce Huge, MD  fluconazole (DIFLUCAN) 200 MG tablet Take 1 tablet (200 mg total) by mouth daily. Patient not taking: Reported on 03/23/2017 02/15/15   Hubbard Robinson, MD  hydrochlorothiazide (HYDRODIURIL) 25 MG tablet Take 1 tablet by mouth daily. 02/26/17   [provider]  JANUVIA 50 MG tablet Take 1 tablet by mouth daily. 02/26/17   [provider]  lisinopril (PRINIVIL,ZESTRIL) 10 MG tablet Take 10 mg by mouth daily.    [provider]  lisinopril (PRINIVIL,ZESTRIL) 30 MG tablet Take 1 tablet (30 mg total) by mouth daily. Patient not taking: Reported on 03/23/2017 05/05/16 05/05/17  Gregor Hams, MD  oxyCODONE-acetaminophen (PERCOCET/ROXICET) 5-325 MG tablet Take 1-2 tablets by mouth every 4 (four) hours as needed for moderate pain. Patient not taking: Reported on 03/23/2017 02/06/15   Marlyce Huge, MD  pantoprazole (PROTONIX) 40 MG tablet Take 2 tablets by mouth daily. 03/03/17   [provider]  predniSONE (DELTASONE) 20 MG tablet Take 3 tablets (60 mg total) by mouth daily with breakfast. Patient not taking: Reported on 03/23/2017 02/11/15   Orbie Pyo, MD  traMADol (ULTRAM) 50 MG tablet Take 1 tablet (50 mg total) by mouth every 6 (six) hours as needed. Patient not taking: Reported on 03/23/2017 12/10/16 12/10/17  Merlyn Lot, MD   Dg Ankle 2 Views Left  Result  Date: 03/22/2017 CLINICAL DATA:  Fall with left ankle deformity. Post reduction of dislocation. EXAM: LEFT ANKLE - 2 VIEW COMPARISON:  None. FINDINGS: Imaging performed with cast material in place limiting osseous and soft tissue fine detail. Oblique distal fibular fracture at the level of the ankle mortise. Probable posterior tibial tubercle fracture. Widening of the medial clear space suggesting ligamentous injury with possible tiny medial malleolus avulsion. Lateral subluxation of the talus with respect to the tibial plafond. There is a plantar calcaneal spur. IMPRESSION: Mildly displaced distal fibular fracture, probable posterior malleolar fracture and widening of the medial clear space with possible avulsion from the medial malleolus. Suspect trimalleolar injury. Lateral subluxation of the talus with respect to the tibial plafond. Electronically Signed   By: Jeb Levering M.D.   On: 03/22/2017 22:25   Ct Ankle  Left Wo Contrast  Result Date: 03/23/2017 CLINICAL DATA:  Left ankle fracture, postreduction. EXAM: CT OF THE LEFT ANKLE WITHOUT CONTRAST TECHNIQUE: Multidetector CT imaging of the left ankle was performed according to the standard protocol. Multiplanar CT image reconstructions were also generated. COMPARISON:  Radiographs earlier this day. FINDINGS: Bones/Joint/Cartilage Oblique distal fibular fracture at and just proximal to the ankle mortise. Mild displacement and comminution. Posterior malleolar fracture with small chip fragment, not affecting the weight-bearing articular surface. Tiny avulsion fracture distal to the medial malleolus. No tibiotalar joint effusion. Mild widening of the medial ankle mortise. No additional fracture of the included hindfoot. Ligaments Suboptimally assessed by CT. Muscles and Tendons No evidence of tendon entrapment. Intact Achilles tendon. No intramuscular hematoma. Soft tissues Subcutaneous soft tissue edema about the ankle. IMPRESSION: Trimalleolar injury with  mildly displaced and comminuted distal fibular fracture, chip fracture from the posterior malleolus, and small avulsion fracture from the medial malleolus. Mild widening of the medial ankle mortise. Electronically Signed   By: Jeb Levering M.D.   On: 03/23/2017 00:07   Dg Chest Portable 1 View  Result Date: 03/22/2017 CLINICAL DATA:  52 y/o M; cough and left ankle fracture. History of COPD and hypertension. EXAM: PORTABLE CHEST 1 VIEW COMPARISON:  04/27/2016 chest radiograph FINDINGS: Stable normal cardiac silhouette given projection and technique. Clear lungs. No pleural effusion or pneumothorax. Bones are unremarkable. IMPRESSION: No active disease. Electronically Signed   By: Kristine Garbe M.D.   On: 03/22/2017 23:06    Positive ROS: All other systems have been reviewed and were otherwise negative with the exception of those mentioned in the HPI and as above.  Physical Exam: BP 108/78 (BP Location: Left Arm)   Pulse 94   Temp 98.6 F (37 C) (Oral)   Resp 18   Ht '6\' 2"'$  (1.88 m)   Wt 104.1 kg (229 lb 6.4 oz)   SpO2 96%   BMI 29.45 kg/m  General:  Alert, no acute distress Psychiatric:  Patient is competent for consent with normal mood and affect   Cardiovascular:  No pedal edema, regular rate and rhythm Respiratory:  No wheezing, non-labored breathing, chest sounds clear GI:  Abdomen is soft and non-tender Skin:  No lesions in the area of chief complaint, no erythema Neurologic:  Sensation intact distally, CN grossly intact Lymphatic:  No axillary or cervical lymphadenopathy  Orthopedic Exam:  LLE: -Able to wiggle toes -Sensation intact grossly over toes - Toes warm and well perfused with less than 2 seconds of capillary refill -Splint in place over left leg.  X-rays and CT scan:  As above: Reveal a lateral malleolus fracture with syndesmosis widening on x-rays.  There also appears to be a small avulsion fracture off of the medial malleolus as well as a fracture of  the posterior malleolus that is involving less than 5% of the articular surface.  CT scan redemonstrates these findings and shows syndesmosis malalignment as well.  Assessment/Plan: 52 yo M w/L trimalleolar ankle fracture with syndesmosis injury 1. After discussion of risks, benefits, and alternatives to surgery, the patient elected to proceed with surgery in the form of ORIF left ankle and syndesmosis repair. We did discuss that he was at higher risk of complication due to smoking and diabetes.  2. Plan for OR today ~130pm. 3. NPO until OR 4. Hospitalist consult for assistance with management of pneumonia and diabetes.  5. NWB LLE   Leim Fabry   03/23/2017 8:43 AM

## 2017-03-23 NOTE — Discharge Instructions (Signed)
Ankle Fracture Surgery °  °Post-Op Instructions °  °1. Bracing or crutches: Crutches will be provided at the time of discharge. °  °2. Splint/Cast: You will have a splint (3/4 cast) on your leg after surgery. Ensure that this remains clean and dry until follow up appointment. If this becomes wet, you need to call our offices to get it changed or else you risk skin breakdown.   °  °3. Driving:  Plan on not driving for at least four to six weeks. Please note that you are advised NOT to drive while taking narcotic pain medications as you may be impaired and unsafe to drive. °  °4. Activity: Ankle pumps several times an hour while awake to prevent blood clots. Weight bearing: Non-weight bearing. Use crutches for at least 6 weeks, if not longer based on your surgery. Bending and straightening the knee is unlimited. Elevate knee above heart level as much as possible for one week. Avoid standing more than 5 minutes (consecutively) for the first week.  Avoid long distance travel for 4 weeks. °  °5. Medications:  °- You have been provided a prescription for narcotic pain medicine. After surgery, take 1-2 narcotic tablets every 4 hours if needed for severe pain. Please start this as soon as you begin to start having pain (if you received a nerve block, start taking as soon as this wears off).  °- A prescription for anti-nausea medication will be provided in case the narcotic medicine causes nausea - take 1 tablet every 6 hours only if nauseated.  °- Take enteric coated aspirin 325 mg once daily for 4 weeks to prevent blood clots.  °-Take tylenol 1000 mg every 8 hours for pain.  May stop tylenol 5 days after surgery if you are having minimal pain. °  °If you are taking prescription medication for anxiety, depression, insomnia, muscle spasm, chronic pain, or for attention deficit disorder you are advised that you are at a higher risk of adverse effects with use of narcotics post-op, including narcotic addiction/dependence,  depressed breathing, death. °If you use non-prescribed substances: alcohol, marijuana, cocaine, heroin, methamphetamines, etc., you are at a higher risk of adverse effects with use of narcotics post-op, including narcotic addiction/dependence, depressed breathing, death. °You are advised that taking > 50 morphine milligram equivalents (MME) of narcotic pain medication per day results in twice the risk of overdose or death. For your prescription provided: oxycodone 5 mg - taking more than 6 tablets per day. Be advised that we will prescribe narcotics short-term, for acute post-operative pain only - 1 week for minor operations such as knee arthroscopy for meniscus tear resection, and 3 weeks for major operations such as knee repair/reconstruction surgeries.  °  °6. Physical Therapy: Plan to start after follow up appointment at 2 weeks. 1-2 times per week for ~12-16 weeks. Therapy typically starts on post operative Day 3 or 4. You have been provided an order for physical therapy. The therapist will provide home exercises. Please contact our offices if this appointment has not been scheduled.  °  °7. Work/School: May return to full work when off of crutches. May do light duty/desk job or return to school in approximately 1-2 weeks when off of narcotics, pain is well-controlled, and swelling has decreased. °  °8. Post-Op Appointments: °Your first post-op appointment will be with Dr. Yeni Jiggetts in approximately 2 weeks time.  °  °If you find that they have not been scheduled please call the Orthopaedic Appointment front desk at [phone]. ° ° ° ° ° °

## 2017-03-24 ENCOUNTER — Encounter: Payer: Self-pay | Admitting: Orthopedic Surgery

## 2017-03-24 LAB — GLUCOSE, CAPILLARY: Glucose-Capillary: 231 mg/dL — ABNORMAL HIGH (ref 65–99)

## 2017-03-24 LAB — HIV ANTIBODY (ROUTINE TESTING W REFLEX): HIV SCREEN 4TH GENERATION: NONREACTIVE

## 2017-03-24 MED ORDER — OXYCODONE HCL 5 MG PO TABS: 5.0000 mg | ORAL_TABLET | ORAL | 0 refills | Status: DC | PRN

## 2017-03-24 MED ORDER — ONDANSETRON HCL 4 MG PO TABS: 4.0000 mg | ORAL_TABLET | Freq: Four times a day (QID) | ORAL | 0 refills | Status: DC | PRN

## 2017-03-24 MED ORDER — ACETAMINOPHEN 500 MG PO TABS: 1000.0000 mg | ORAL_TABLET | Freq: Three times a day (TID) | ORAL | 0 refills | Status: DC

## 2017-03-24 MED ORDER — ASPIRIN 325 MG PO TBEC: 325.0000 mg | DELAYED_RELEASE_TABLET | Freq: Every day | ORAL | 0 refills | Status: DC

## 2017-03-24 MED ORDER — LINAGLIPTIN 5 MG PO TABS
5.0000 mg | ORAL_TABLET | Freq: Every day | ORAL | Status: DC
  Filled 2017-03-24: qty 1

## 2017-03-24 MED ORDER — METFORMIN HCL ER 500 MG PO TB24
500.0000 mg | ORAL_TABLET | Freq: Every day | ORAL | Status: DC
  Filled 2017-03-24: qty 1

## 2017-03-24 NOTE — Progress Notes (Addendum)
  Subjective: 1 Day Post-Op Procedure(s) (LRB): OPEN REDUCTION INTERNAL FIXATION (ORIF) ANKLE FRACTURE (Left) SYNDESMOSIS REPAIR (Left) Patient reports pain as mild.   Patient seen in rounds with Dr. Posey Pronto. Patient is well, and has had no acute complaints or problems Plan is to go Home after hospital stay. Negative for chest pain and shortness of breath Fever: no Gastrointestinal: Negative for nausea and vomiting  Objective: Vital signs in last 24 hours: Temp:  [97.4 F (36.3 C)-98.6 F (37 C)] 97.4 F (36.3 C) (01/09 0339) Pulse Rate:  [90-104] 90 (01/09 0339) Resp:  [9-18] 17 (01/09 0339) BP: (99-127)/(62-99) 124/71 (01/09 0339) SpO2:  [92 %-99 %] 96 % (01/09 0339)  Intake/Output from previous day:  Intake/Output Summary (Last 24 hours) at 03/24/2017 0713 Last data filed at 03/24/2017 0643 Gross per 24 hour  Intake 1335 ml  Output 1700 ml  Net -365 ml    Intake/Output this shift: No intake/output data recorded.  Labs: Recent Labs    03/22/17 2251  HGB 13.9   Recent Labs    03/22/17 2251  WBC 9.5  RBC 4.91  HCT 42.4  PLT 252   Recent Labs    03/22/17 2251 03/23/17 0432  NA 133*  --   K 3.3* 3.4*  CL 99*  --   CO2 20*  --   BUN 17  --   CREATININE 1.20  --   GLUCOSE 202*  --   CALCIUM 8.9  --    Recent Labs    03/22/17 2251  INR 0.90     EXAM General - Patient is Alert and Oriented Extremity - Compartment soft No sensation to the distal tips of his toes. No dorsiflexion or plantarflexion of his toes in the splint. Dressing/Incision - clean, dry, no drainage Motor Function - no motor function involving his toes with plantar flexion and dorsiflexion.  Past Medical History:  Diagnosis Date  . 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 COPD (chronic obstructive pulmonary disease) ([place])   . Diabetes mellitus without complication ([place])    type 2  . Hypertension     Assessment/Plan: 1 Day Post-Op Procedure(s) (LRB): OPEN REDUCTION INTERNAL FIXATION  (ORIF) ANKLE FRACTURE (Left) SYNDESMOSIS REPAIR (Left) Active Problems:   Ankle fracture  Estimated body mass index is 29.45 kg/m as calculated from the following:   Height as of this encounter: 6\' 2"  (1.88 m).   Weight as of this encounter: 104.1 kg (229 lb 6.4 oz). Advance diet Up with therapy D/C IV fluids  Discharge home after physical therapy. Non-weight-bear for 6-10 weeks. Pain management.  DVT Prophylaxis - Aspirin Non-Weight-Bearing as tolerated to left leg  Reche Dixon, PA-C Orthopaedic Surgery 03/24/2017, 7:13 AM   ADDENDUM: No significant pain as block is still working well. Able to get to chair from bed on own.  LLE: No sens over toes No motor in toes Toes wwp Splint in place  S/p ORIF L ankle and syndesmosis repair - NWB LLE - PT/OT today - ASA 325mg /day for DVT ppx - Finish 24 hrs ancef - DC home today after PT

## 2017-03-24 NOTE — Evaluation (Signed)
Physical Therapy Evaluation Patient Details Name: Darren Allen MRN: [id number] DOB: Oct 15, 1965 Today's Date: 03/24/2017   History of Present Illness  Pt was admitted s/p an ORIF for a L Trimalleolar fracture.  PMH includes Htn, DM, COPD and calculus of the kidney.  Clinical Impression  *Pt is a pleasant 52 yo male who lives in a one story home with his fiance.  PT reviewed proper use of crutches for transfers and gait and pt was able to demonstrate understanding.  Pt was able to ambulate 40 ft in room, showing signs of SOB following the first 20 ft.  Pt stated that he normally uses an inhaler in the mornings but he has not had access to it this morning.  Pt's O2/HR: 98/106 following 40 ft of ambulation.  Pt reported 4/10 pain during STS transfer and stated that he still feels unable to move his L toes. He also reported decreased sensation in L toes.    Pt stated understanding of his NWB status and is interested in OP PT upon DC.  His fiance is available for transportation.   Pt will continue to benefit from skilled PT with focus on functional mobility, pain management and balance.    Follow Up Recommendations Outpatient PT(Pt is interested in outpatient PT at [facility] and will need [facility] service to enter building due to COPD.)    Equipment Recommendations  Crutches    Recommendations for Other Services       Precautions / Restrictions Restrictions Weight Bearing Restrictions: Yes LLE Weight Bearing: Non weight bearing      Mobility  Bed Mobility Overal bed mobility: Modified Independent             General bed mobility comments: Pt is able to perform bed mobility in a reasonable amount of time with use of bed rail.  Transfers Overall transfer level: Needs assistance Equipment used: Crutches Transfers: Sit to/from Stand Sit to Stand: Supervision         General transfer comment: Pt demonstrated physical ability to perform STS with use of crutches but required education from PT  concerning proper use of crutches and hand placement when sitting in recliner.    Ambulation/Gait Ambulation/Gait assistance: Supervision Ambulation Distance (Feet): 40 Feet Assistive device: Crutches     Gait velocity interpretation: Below normal speed for age/gender General Gait Details: Pt demonstrated a swing through gait with bilateral crutches, taking 3 rest breaks due to feeling SOB.  PT educated pt concerning proper sequencing and maintained close SBA due to pt not having been out of bed in 24 hrs.  PT educated pt concerning stair negotiation as pt has one step to enter home.  Stairs            Wheelchair Mobility    Modified Rankin (Stroke Patients Only)       Balance Overall balance assessment: Modified Independent                                           Pertinent Vitals/Pain Pain Assessment: 0-10 Pain Score: 4  Pain Descriptors / Indicators: Aching Pain Intervention(s): Limited activity within patient's tolerance;Monitored during session    Home Living Family/patient expects to be discharged to:: Private residence Living Arrangements: Spouse/significant other(Fiance) Available Help at Discharge: Family Type of Home: House Home Access: Stairs to enter Entrance Stairs-Rails: Can reach both Entrance Stairs-Number of Steps: 1 Home Layout:  One level Home Equipment: (Pt states that he gave his crutches away.)      Prior Function Level of Independence: Independent               Hand Dominance        Extremity/Trunk Assessment   Upper Extremity Assessment Upper Extremity Assessment: Overall WFL for tasks assessed    Lower Extremity Assessment Lower Extremity Assessment: Overall WFL for tasks assessed(Pt reported that sensation is still not intact in L LE and that he feels that he is unable to move his toes.  Pt did not demonstrate weakness but experienced cramping in R LE on first attempt to stand.)    Cervical / Trunk  Assessment Cervical / Trunk Assessment: Normal  Communication   Communication: No difficulties  Cognition Arousal/Alertness: Awake/alert Behavior During Therapy: WFL for tasks assessed/performed Overall Cognitive Status: Within Functional Limits for tasks assessed                                        General Comments      Exercises     Assessment/Plan    PT Assessment Patient needs continued PT services  PT Problem List Decreased strength;Decreased range of motion;Decreased activity tolerance;Decreased balance;Decreased mobility;Decreased coordination;Impaired sensation;Pain       PT Treatment Interventions DME instruction;Gait training;Stair training;Functional mobility training;Therapeutic activities;Therapeutic exercise;Balance training;Neuromuscular re-education;Patient/family education    PT Goals (Current goals can be found in the Care Plan section)  Acute Rehab PT Goals Patient Stated Goal: To return home and complete OP PT. PT Goal Formulation: With patient/family Time For Goal Achievement: 04/07/17 Potential to Achieve Goals: Good    Frequency BID   Barriers to discharge        Co-evaluation               AM-PAC PT "6 Clicks" Daily Activity  Outcome Measure Difficulty turning over in bed (including adjusting bedclothes, sheets and blankets)?: A Little Difficulty moving from lying on back to sitting on the side of the bed? : A Little Difficulty sitting down on and standing up from a chair with arms (e.g., wheelchair, bedside commode, etc,.)?: A Little Help needed moving to and from a bed to chair (including a wheelchair)?: A Little Help needed walking in hospital room?: A Little Help needed climbing 3-5 steps with a railing? : A Little 6 Click Score: 18    End of Session Equipment Utilized During Treatment: Gait belt Activity Tolerance: Patient tolerated treatment well;Patient limited by fatigue(Pt became SOB following 20 ft of  ambulation.) Patient left: in chair;with chair alarm set;with family/visitor present;with call bell/phone within reach Nurse Communication: (Communicated with physician that pt requests his inhalers.) PT Visit Diagnosis: Unsteadiness on feet (R26.81);Other abnormalities of gait and mobility (R26.89);Pain Pain - Right/Left: Left Pain - part of body: Ankle and joints of foot    Time: 0800-0825 PT Time Calculation (min) (ACUTE ONLY): 25 min   Charges:   PT Evaluation $PT Eval Low Complexity: 1 Low PT Treatments $Therapeutic Activity: 8-22 mins   PT G Codes:   PT G-Codes **NOT FOR INPATIENT CLASS** Functional Assessment Tool Used: AM-PAC 6 Clicks Basic Mobility Functional Limitation: Mobility: Walking and moving around Mobility: Walking and Moving Around Current Status (J8841): At least 40 percent but less than 60 percent impaired, limited or restricted Mobility: Walking and Moving Around Goal Status [phone]): At least 1 percent but less than  20 percent impaired, limited or restricted    Roxanne Gates, PT, DPT   Roxanne Gates 03/24/2017, 8:52 AM

## 2017-03-24 NOTE — Evaluation (Signed)
Occupational Therapy Evaluation Patient Details Name: Darren Allen MRN: [id number] DOB: 1965/12/10 Today's Date: 03/24/2017    History of Present Illness Pt was admitted s/p an ORIF for a L Trimalleolar fracture.  PMH includes Htn, DM, COPD and calculus of the kidney.   Clinical Impression   Pt seen for OT evaluation POD#1 from surgery noted above. Pt was independent and working full time in a [place] prior to admission and is eager to return to baseline independence. Currently. Pt presents with pain and impaired sensation in LLE, impaired ROM in LLE, and increased need for assist with LB ADL tasks which fiance and/or son is able to provide independently. Pt noted with COPD and endorses getting winded after exertion. Pt/fiance educated briefly in energy conservation strategies including activity pacing, work simplification, and  home/routines modifications to maximize safety and independence while minimizing risk of exaccerbating COPD. Handout provided and pt/fiance verbalized understanding of all education provided.  Plan for OP PT per pt/fiance. Encouraged pt/fiance to take advantage of transport chairs upon entering the hospital to/from outpatient therapy. No additional skilled OT follow up after this hospitalization. Will sign off. Please re-consult if additional needs arise.     Follow Up Recommendations  No OT follow up    Equipment Recommendations  None recommended by OT    Recommendations for Other Services       Precautions / Restrictions Precautions Precautions: Fall Restrictions Weight Bearing Restrictions: Yes LLE Weight Bearing: Non weight bearing      Mobility Bed Mobility             General bed mobility comments: deferred, up in recliner  Transfers Overall transfer level: Needs assistance Equipment used: Crutches Transfers: Sit to/from Stand Sit to Stand: Supervision             Balance Overall balance assessment:  Modified Independent                                         ADL either performed or assessed with clinical judgement   ADL Overall ADL's : Modified independent                                       General ADL Comments: modified independent with ADL tasks, fiance and son able to assist as needed. Reviewed strategies to perform bathing without getting LLE wet. Encouraged pt to sit for initial shower once able to maximize energy conservation and minimize falls risk     Vision Baseline Vision/History: No visual deficits(pt states he needs glasses but doesn't have them) Patient Visual Report: No change from baseline Vision Assessment?: No apparent visual deficits     Perception     Praxis      Pertinent Vitals/Pain Pain Assessment: 0-10 Pain Score: 4  Pain Descriptors / Indicators: Aching Pain Intervention(s): Limited activity within patient's tolerance;Monitored during session;Premedicated before session     Hand Dominance Right   Extremity/Trunk Assessment Upper Extremity Assessment Upper Extremity Assessment: Overall WFL for tasks assessed   Lower Extremity Assessment Lower Extremity Assessment: LLE deficits/detail;Overall WFL for tasks assessed LLE Deficits / Details: pt has not gotten back full sensation in LLE following surgery LLE Sensation: decreased light touch   Cervical / Trunk Assessment Cervical / Trunk Assessment: Normal   Communication Communication Communication:  No difficulties   Cognition Arousal/Alertness: Awake/alert Behavior During Therapy: WFL for tasks assessed/performed Overall Cognitive Status: Within Functional Limits for tasks assessed                                     General Comments       Exercises Other Exercises Other Exercises: pt/fiance educated in energy conservation strategies including activity pacing, work simplification, home/routines modifications to maximize safety and  independence while minimizing risk of exaccerbating COPD. handout provided, pt/fiance verbalized understanding.   Shoulder Instructions      Home Living Family/patient expects to be discharged to:: Private residence Living Arrangements: Spouse/significant other(fiance, son) Available Help at Discharge: Family Type of Home: House Home Access: Stairs to enter Technical brewer of Steps: 1 Entrance Stairs-Rails: Can reach both Home Layout: One level     Bathroom Shower/Tub: Tub/shower unit;Walk-in shower   Bathroom Toilet: Associate Professor Accessibility: Yes   Home Equipment: Shower seat - built in;Hand held shower head          Prior Functioning/Environment Level of Independence: Independent        Comments: pt independent with mobility, ADL, IADL, including working full time at a [employer] requiring him to be on his feet on and off for 12 hours/day        OT Problem List:        OT Treatment/Interventions:      OT Goals(Current goals can be found in the care plan section) Acute Rehab OT Goals Patient Stated Goal: To return home and complete OP PT. OT Goal Formulation: All assessment and education complete, DC therapy  OT Frequency:     Barriers to D/C:            Co-evaluation              AM-PAC PT "6 Clicks" Daily Activity     Outcome Measure Help from another person eating meals?: None Help from another person taking care of personal grooming?: None Help from another person toileting, which includes using toliet, bedpan, or urinal?: None Help from another person bathing (including washing, rinsing, drying)?: A Little Help from another person to put on and taking off regular upper body clothing?: None Help from another person to put on and taking off regular lower body clothing?: A Little 6 Click Score: 22   End of Session    Activity Tolerance: Patient tolerated treatment well Patient left: in chair;with call bell/phone  within reach;with chair alarm set;with family/visitor present  OT Visit Diagnosis: Other abnormalities of gait and mobility (R26.89);History of falling (Z91.81);Pain Pain - Right/Left: Left Pain - part of body: Ankle and joints of foot                Time: 6759-1638 OT Time Calculation (min): 10 min Charges:  OT General Charges $OT Visit: 1 Visit OT Evaluation $OT Eval Low Complexity: 1 Low   Jeni Salles, MPH, MS, OTR/L ascom [phone] 03/24/17, 9:18 AM

## 2017-03-24 NOTE — Progress Notes (Signed)
Possible discharge later today as per orthopedic note.  Medical follow-up for COPD, hypertension, diabetes.  Patient denies any shortness of breath.  Hypoglycemia yesterday because patient diabetic medicine not started due to surgery, continued on insulin.   Physical exam: Alert, awake, oriented. Cardiovascular: S1, S2 regular. Lungs: Clear to auscultation, no wheezing now. Musculoskeletal: Left leg dressing present  Assessment and plan: Left ankle fracture status post repair, physical therapy consult and then possible discharge later today after physical therapy, continue aspirin 325 mg for DVT prophylaxis, nonweightbearing to left leg, follow-up with orthopedic as an outpatient 2.  Diabetes mellitus type 2: Continue metformin, Januvia, 3.  Essential hypertension: Restarted medicine 4.  History of COPD: Continue home dose inhalers at discharge.,  Recent COPD attack: Patient is on prednisone dose taper, continue and finish the dose taper. Time spent 25 minutes

## 2017-03-24 NOTE — Progress Notes (Signed)
Discharge instructions and medication details reviewed with patient. Patient verbalizes understanding . Printed prescriptions and AVS given to patient. Follow up appointment made. IV removed. Patient escorted out via wheelchair.

## 2017-03-24 NOTE — Discharge Summary (Signed)
Physician Discharge Summary  Subjective: 1 Day Post-Op Procedure(s) (LRB): OPEN REDUCTION INTERNAL FIXATION (ORIF) ANKLE FRACTURE (Left) SYNDESMOSIS REPAIR (Left) Patient reports pain as mild.   Patient seen in rounds with Dr. Posey Pronto. Patient is well, and has had no acute complaints or problems Patient is ready to go home after physical therapy  Physician Discharge Summary  Patient ID: Darren Allen MRN: [id number] DOB/AGE: [birth year] 52 y.o.  Admit date: 03/22/2017 Discharge date: 03/24/2017  Admission Diagnoses:  Discharge Diagnoses:  Active Problems:   Ankle fracture   Discharged Condition: fair  Hospital Course: The patient is postop day 1 from a left ankle trauma bimalleolar fracture with ORIF. The patient is doing well since surgery. His pain is well managed. He does not have sensation involving his toes and foot. He has been nonweight bear to the bathroom. He is physical therapy this morning before discharge.  Treatments: surgery:  1. ORIF of trimalleolar L ankle fracture without fixation of posterior malleolus 2. Syndesmosis repair of L ankle  SURGEON: Cato Mulligan, MD   ANESTHESIA:Regional  + Gen  ESTIMATED BLOOD LOSS:10cc  DRAINS: None  TOTAL IV FLUIDS:see anesthesia record  TOURNIQUET TIME: 90 minutes  IMPLANTS: Stryker VariAx 5 hole distal fibular plate 3 - 9.6EX Stryker locking screws distally 3 - 3.14m Stryker cortical screws proximally 1 - 2.712mStryker 2846mag screw 2 - Arthrex Syndesmosis TightRopes    Discharge Exam: Blood pressure 124/71, pulse 90, temperature (!) 97.4 F (36.3 C), temperature source Oral, resp. rate 17, height '6\' 2"'$  (1.88 m), weight 104.1 kg (229 lb 6.4 oz), SpO2 96 %.   Disposition: 01-Home or Self Care   Allergies as of 03/24/2017      Reactions   Glipizide Other (See Comments)   Shaky, feel bad      Medication List    STOP taking these medications   oxyCODONE-acetaminophen 5-325 MG tablet Commonly  known as:  PERCOCET/ROXICET     TAKE these medications   acetaminophen 500 MG tablet Commonly known as:  TYLENOL Take 2 tablets (1,000 mg total) by mouth every 8 (eight) hours.   amoxicillin-clavulanate 875-125 MG tablet Commonly known as:  AUGMENTIN Take 1 tablet by mouth every 12 (twelve) hours.   aspirin 325 MG EC tablet Take 1 tablet (325 mg total) by mouth daily.   azithromycin 250 MG tablet Commonly known as:  ZITHROMAX TK UTD   budesonide-formoterol 80-4.5 MCG/ACT inhaler Commonly known as:  SYMBICORT Inhale 2 puffs into the lungs 2 (two) times daily.   fluconazole 200 MG tablet Commonly known as:  DIFLUCAN Take 1 tablet (200 mg total) by mouth daily.   hydrochlorothiazide 25 MG tablet Commonly known as:  HYDRODIURIL Take 1 tablet by mouth daily.   JANUVIA 50 MG tablet Generic drug:  sitaGLIPtin Take 1 tablet by mouth daily.   lisinopril 10 MG tablet Commonly known as:  PRINIVIL,ZESTRIL Take 10 mg by mouth daily.   lisinopril 30 MG tablet Commonly known as:  PRINIVIL,ZESTRIL Take 1 tablet (30 mg total) by mouth daily.   lisinopril 40 MG tablet Commonly known as:  PRINIVIL,ZESTRIL Take 1 tablet by mouth daily.   metFORMIN 500 MG 24 hr tablet Commonly known as:  GLUCOPHAGE-XR Take 4 tablets by mouth daily.   nicotine 21 mg/24hr patch Commonly known as:  NICODERM CQ - dosed in mg/24 hours Place 1 patch onto the skin daily.   omeprazole 20 MG tablet Commonly known as:  PRILOSEC OTC Take 20 mg by mouth 2 (  two) times daily.   ondansetron 4 MG tablet Commonly known as:  ZOFRAN Take 1 tablet (4 mg total) by mouth every 6 (six) hours as needed for nausea.   oxyCODONE 5 MG immediate release tablet Commonly known as:  Oxy IR/ROXICODONE Take 1-2 tablets (5-10 mg total) by mouth every 4 (four) hours as needed for breakthrough pain.   pantoprazole 40 MG tablet Commonly known as:  PROTONIX Take 2 tablets by mouth daily.   predniSONE 20 MG  tablet Commonly known as:  DELTASONE Take 3 tablets (60 mg total) by mouth daily with breakfast.   predniSONE 10 MG (48) Tbpk tablet Commonly known as:  STERAPRED UNI-PAK 48 TAB TK PO UTD   Testosterone Cypionate 200 MG/ML Kit Inject 200 mg into the muscle every 14 (fourteen) days.   tiotropium 18 MCG inhalation capsule Commonly known as:  SPIRIVA Place 18 mcg into inhaler and inhale daily.   traMADol 50 MG tablet Commonly known as:  ULTRAM Take 1 tablet (50 mg total) by mouth every 6 (six) hours as needed.   VENTOLIN HFA 108 (90 Base) MCG/ACT inhaler Generic drug:  albuterol Inhale 2 puffs into the lungs every 6 (six) hours as needed.      Follow-up Information    Leim Fabry, MD. Schedule an appointment as soon as possible for a visit in 2 week(s).   Specialty:  Orthopedic Surgery Why:  For suture and staple removal. Contact information: [place] [address] [phone]           Signed: Valmore Arabie 03/24/2017, 7:18 AM   Objective: Vital signs in last 24 hours: Temp:  [97.4 F (36.3 C)-98.6 F (37 C)] 97.4 F (36.3 C) (01/09 0339) Pulse Rate:  [90-104] 90 (01/09 0339) Resp:  [9-18] 17 (01/09 0339) BP: (99-127)/(62-99) 124/71 (01/09 0339) SpO2:  [92 %-99 %] 96 % (01/09 0339)  Intake/Output from previous day:  Intake/Output Summary (Last 24 hours) at 03/24/2017 0718 Last data filed at 03/24/2017 0643 Gross per 24 hour  Intake 1335 ml  Output 1700 ml  Net -365 ml    Intake/Output this shift: No intake/output data recorded.  Labs: Recent Labs    03/22/17 2251  HGB 13.9   Recent Labs    03/22/17 2251  WBC 9.5  RBC 4.91  HCT 42.4  PLT 252   Recent Labs    03/22/17 2251 03/23/17 0432  NA 133*  --   K 3.3* 3.4*  CL 99*  --   CO2 20*  --   BUN 17  --   CREATININE 1.20  --   GLUCOSE 202*  --   CALCIUM 8.9  --    Recent Labs    03/22/17 2251  INR 0.90    EXAM: General - Patient is Alert and  Oriented Extremity - Compartment soft No sensation involving his toes. Dorsalis flexion and plantarflexion are restricted. Incision - splint is intact. Clean and dry. Motor Function -  no dorsiflexion or plantarflexion of his toes  Assessment/Plan: 1 Day Post-Op Procedure(s) (LRB): OPEN REDUCTION INTERNAL FIXATION (ORIF) ANKLE FRACTURE (Left) SYNDESMOSIS REPAIR (Left) Procedure(s) (LRB): OPEN REDUCTION INTERNAL FIXATION (ORIF) ANKLE FRACTURE (Left) SYNDESMOSIS REPAIR (Left) Past Medical History:  Diagnosis Date  . 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 COPD (chronic obstructive pulmonary disease) ([place])   . Diabetes mellitus without complication ([place])    type 2  . Hypertension    Active Problems:   Ankle fracture  Estimated body mass index is 29.45  kg/m as calculated from the following:   Height as of this encounter: '6\' 2"'$  (1.88 m).   Weight as of this encounter: 104.1 kg (229 lb 6.4 oz). Advance diet Up with therapy D/C IV fluids  Discharge home today. Diet - Diabetic diet Follow up - in 2 weeks Activity - NWB Disposition - Home Condition Upon Discharge - Stable DVT Prophylaxis - Aspirin  Reche Dixon, PA-C Orthopaedic Surgery 03/24/2017, 7:18 AM

## 2017-03-24 NOTE — Care Management Note (Signed)
Case Management Note  Patient Details  Name: Darren Allen MRN: [id number] Date of Birth: 05/23/1965  Subjective/Objective:Met with patient and his wife at bedside. He has crutches. Denies any other DME needs. Per Dr. Patel, patient will not need to start PT until he follows up with ortho in their office in approximately 2 weeks. Patient aware.                   Action/Plan: No discharge needs identified.   Expected Discharge Date:  03/24/17               Expected Discharge Plan:  Home/Self Care  In-House Referral:     Discharge planning Services  CM Consult  Post Acute Care Choice:    Choice offered to:     DME Arranged:    DME Agency:     HH Arranged:    HH Agency:     Status of Service:  Completed, signed off  If discussed at Long Length of Stay Meetings, dates discussed:    Additional Comments:  Lisa M Jacobs, RN 03/24/2017, 9:12 AM  

## 2018-05-27 ENCOUNTER — Emergency Department
Admission: EM | Admit: 2018-05-27 | Discharge: 2018-05-27 | Disposition: A | Discharge status: Home / Self Care | Payer: Managed Care, Other (non HMO) | Attending: Emergency Medicine | Admitting: Emergency Medicine

## 2018-05-27 ENCOUNTER — Other Ambulatory Visit: Payer: Self-pay

## 2018-05-27 DIAGNOSIS — R51 Headache: Secondary | ICD-10-CM | POA: Insufficient documentation

## 2018-05-27 DIAGNOSIS — Z5321 Procedure and treatment not carried out due to patient leaving prior to being seen by health care provider: Secondary | ICD-10-CM | POA: Diagnosis not present

## 2018-05-27 LAB — CBC
HCT: 42.7 % (ref 39.0–52.0)
Hemoglobin: 13.7 g/dL (ref 13.0–17.0)
MCH: 26.3 pg (ref 26.0–34.0)
MCHC: 32.1 g/dL (ref 30.0–36.0)
MCV: 82 fL (ref 80.0–100.0)
PLATELETS: 243 10*3/uL (ref 150–400)
RBC: 5.21 MIL/uL (ref 4.22–5.81)
RDW: 14.6 % (ref 11.5–15.5)
WBC: 6.1 10*3/uL (ref 4.0–10.5)
nRBC: 0 % (ref 0.0–0.2)

## 2018-05-27 LAB — SEDIMENTATION RATE: Sed Rate: 10 mm/hr (ref 0–20)

## 2018-05-27 NOTE — ED Notes (Signed)
Pt to front desk asking about wait time. Pt updated to the best of this RNs ability. Pt is calm and cooperative.

## 2018-05-27 NOTE — ED Triage Notes (Signed)
Pt sent from [facility] for "temple arteritis" pt states L HA (sometimes R)and blurred vision. Symptoms x 4 days. Pt states he went to [facility] for chest pain x "a while" states he had cardiac workup at [facility] and has stress test scheduled with [facility] for a later date. Pt states he is just in ED today for HA and blurred vision. A&O, ambulatory. Speaking in complete sentences.

## 2018-05-27 NOTE — ED Notes (Signed)
Verbal orders for blood work received from Dr. Joni Fears at this time.

## 2018-05-27 NOTE — ED Triage Notes (Signed)
Sent here by cardiologist for possible temporal arteritis.  Pain left temple.  Was seen at office for chest pain.

## 2018-05-27 NOTE — ED Notes (Signed)
Red, green, and lavender sent to lab at this time.

## 2019-01-04 ENCOUNTER — Other Ambulatory Visit: Payer: Self-pay | Admitting: *Deleted

## 2019-01-04 DIAGNOSIS — Z20822 Contact with and (suspected) exposure to covid-19: Secondary | ICD-10-CM

## 2019-01-06 LAB — NOVEL CORONAVIRUS, NAA: SARS-CoV-2, NAA: NOT DETECTED

## 2019-03-07 ENCOUNTER — Emergency Department
Admission: EM | Admit: 2019-03-07 | Discharge: 2019-03-07 | Disposition: A | Discharge status: Home / Self Care | Payer: Medicaid Other | Attending: Emergency Medicine | Admitting: Emergency Medicine

## 2019-03-07 ENCOUNTER — Emergency Department: Payer: Medicaid Other

## 2019-03-07 ENCOUNTER — Other Ambulatory Visit: Payer: Self-pay

## 2019-03-07 DIAGNOSIS — R05 Cough: Secondary | ICD-10-CM | POA: Insufficient documentation

## 2019-03-07 DIAGNOSIS — Z7984 Long term (current) use of oral hypoglycemic drugs: Secondary | ICD-10-CM | POA: Insufficient documentation

## 2019-03-07 DIAGNOSIS — E119 Type 2 diabetes mellitus without complications: Secondary | ICD-10-CM | POA: Insufficient documentation

## 2019-03-07 DIAGNOSIS — J449 Chronic obstructive pulmonary disease, unspecified: Secondary | ICD-10-CM | POA: Diagnosis not present

## 2019-03-07 DIAGNOSIS — Z20828 Contact with and (suspected) exposure to other viral communicable diseases: Secondary | ICD-10-CM | POA: Diagnosis not present

## 2019-03-07 DIAGNOSIS — Z79899 Other long term (current) drug therapy: Secondary | ICD-10-CM | POA: Diagnosis not present

## 2019-03-07 DIAGNOSIS — Z7982 Long term (current) use of aspirin: Secondary | ICD-10-CM | POA: Insufficient documentation

## 2019-03-07 DIAGNOSIS — R61 Generalized hyperhidrosis: Secondary | ICD-10-CM | POA: Diagnosis not present

## 2019-03-07 DIAGNOSIS — F17210 Nicotine dependence, cigarettes, uncomplicated: Secondary | ICD-10-CM | POA: Diagnosis not present

## 2019-03-07 DIAGNOSIS — M79622 Pain in left upper arm: Secondary | ICD-10-CM | POA: Diagnosis not present

## 2019-03-07 DIAGNOSIS — I10 Essential (primary) hypertension: Secondary | ICD-10-CM | POA: Diagnosis not present

## 2019-03-07 DIAGNOSIS — R0789 Other chest pain: Secondary | ICD-10-CM | POA: Diagnosis not present

## 2019-03-07 DIAGNOSIS — R079 Chest pain, unspecified: Secondary | ICD-10-CM | POA: Diagnosis present

## 2019-03-07 LAB — CBC
HCT: 41.6 % (ref 39.0–52.0)
Hemoglobin: 13.6 g/dL (ref 13.0–17.0)
MCH: 25 pg — ABNORMAL LOW (ref 26.0–34.0)
MCHC: 32.7 g/dL (ref 30.0–36.0)
MCV: 76.3 fL — ABNORMAL LOW (ref 80.0–100.0)
Platelets: 202 10*3/uL (ref 150–400)
RBC: 5.45 MIL/uL (ref 4.22–5.81)
RDW: 14.6 % (ref 11.5–15.5)
WBC: 5.2 10*3/uL (ref 4.0–10.5)
nRBC: 0 % (ref 0.0–0.2)

## 2019-03-07 LAB — TROPONIN I (HIGH SENSITIVITY)
Troponin I (High Sensitivity): 4 ng/L (ref <18)
Troponin I (High Sensitivity): 3 ng/L (ref <18)

## 2019-03-07 LAB — BASIC METABOLIC PANEL
Anion gap: 10 (ref 5–15)
BUN: 16 mg/dL (ref 6–20)
CO2: 23 mmol/L (ref 22–32)
Calcium: 9 mg/dL (ref 8.9–10.3)
Chloride: 101 mmol/L (ref 98–111)
Creatinine, Ser: 1.18 mg/dL (ref 0.61–1.24)
GFR calc Af Amer: >60 mL/min (ref >60)
GFR calc non Af Amer: >60 mL/min (ref >60)
Glucose, Bld: 277 mg/dL — ABNORMAL HIGH (ref 70–99)
Potassium: 3.6 mmol/L (ref 3.5–5.1)
Sodium: 134 mmol/L — ABNORMAL LOW (ref 135–145)

## 2019-03-07 LAB — SARS CORONAVIRUS 2 (TAT 6-24 HRS): SARS Coronavirus 2: NEGATIVE

## 2019-03-07 LAB — FIBRIN DERIVATIVES D-DIMER (ARMC ONLY): Fibrin derivatives D-dimer (ARMC): 259.34 ng/mL (FEU) (ref 0.00–499.00)

## 2019-03-07 IMAGING — CR DG CHEST 2V
2 series · 2 of 2 positions shown · non-contrast
Comparison: [DATE]

CLINICAL DATA: Left chest pain radiating into left arm

EXAM:
CHEST - 2 VIEW

[chest pa]
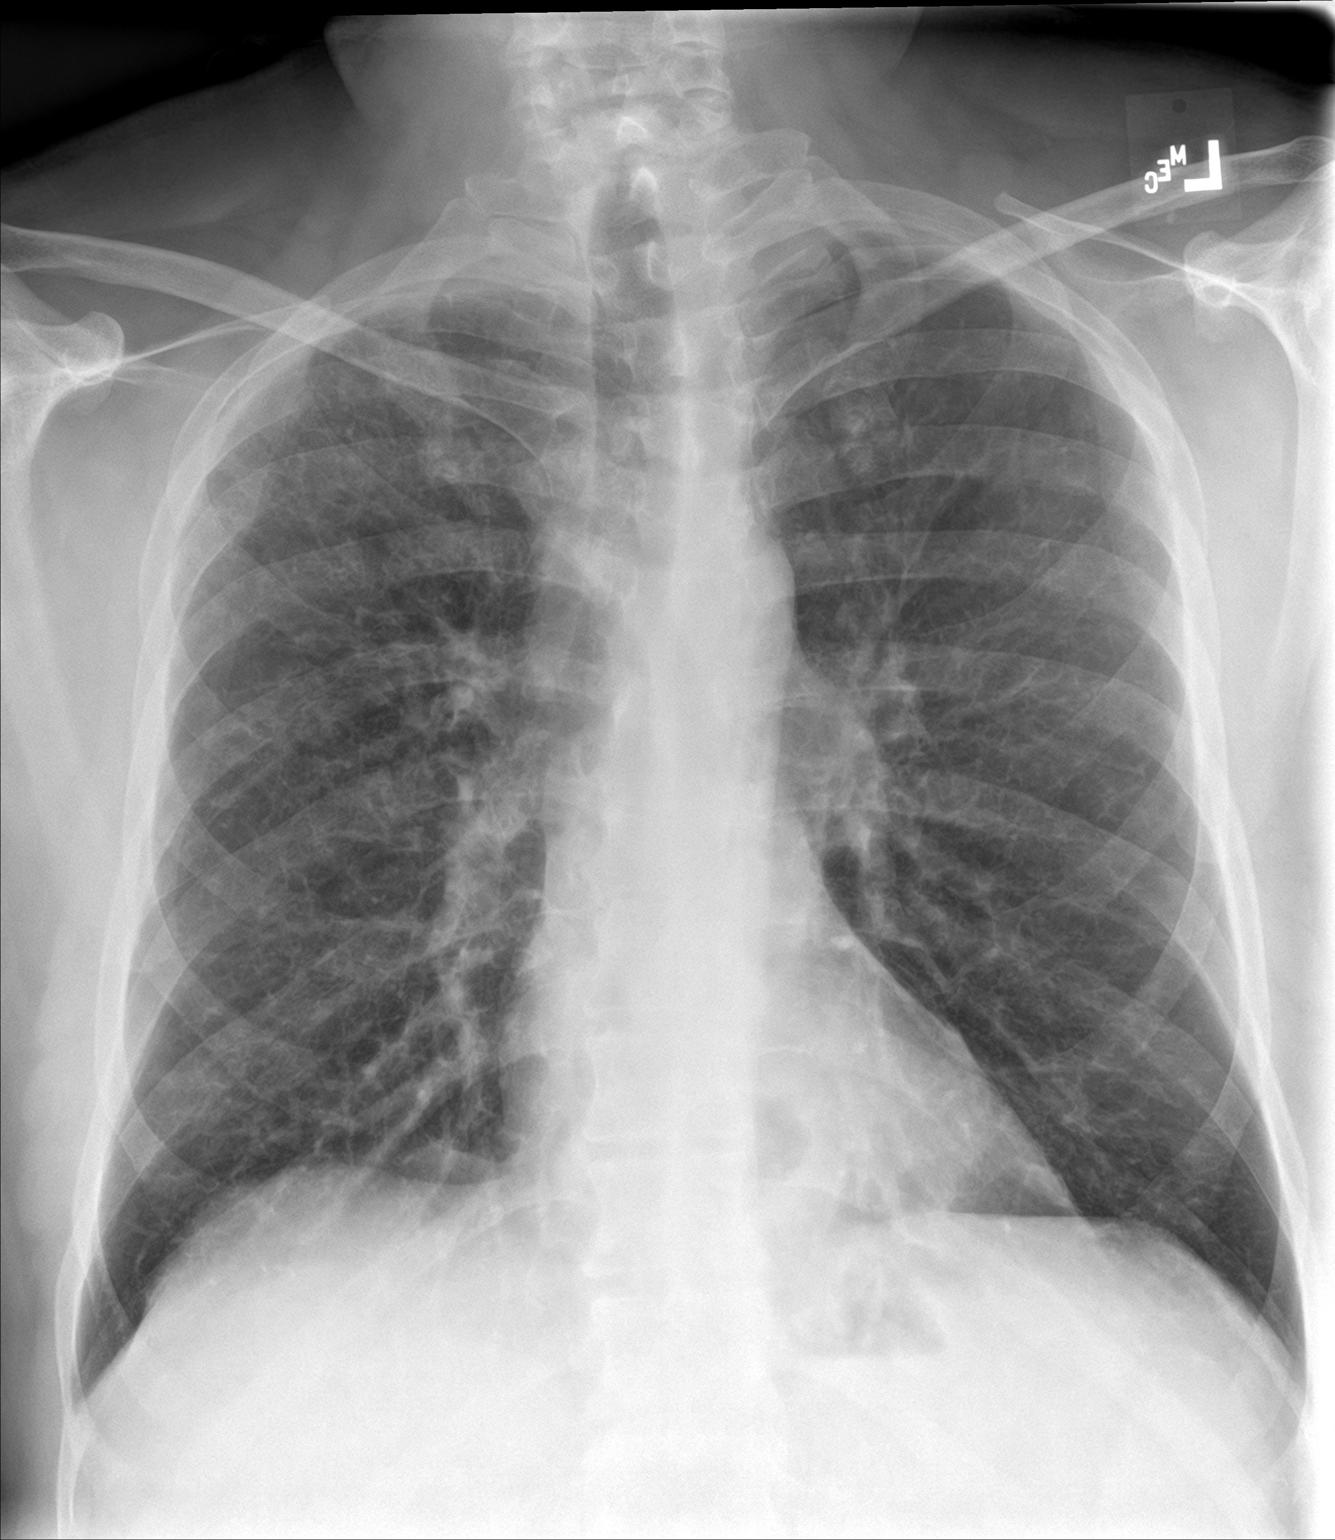

[chest lat]
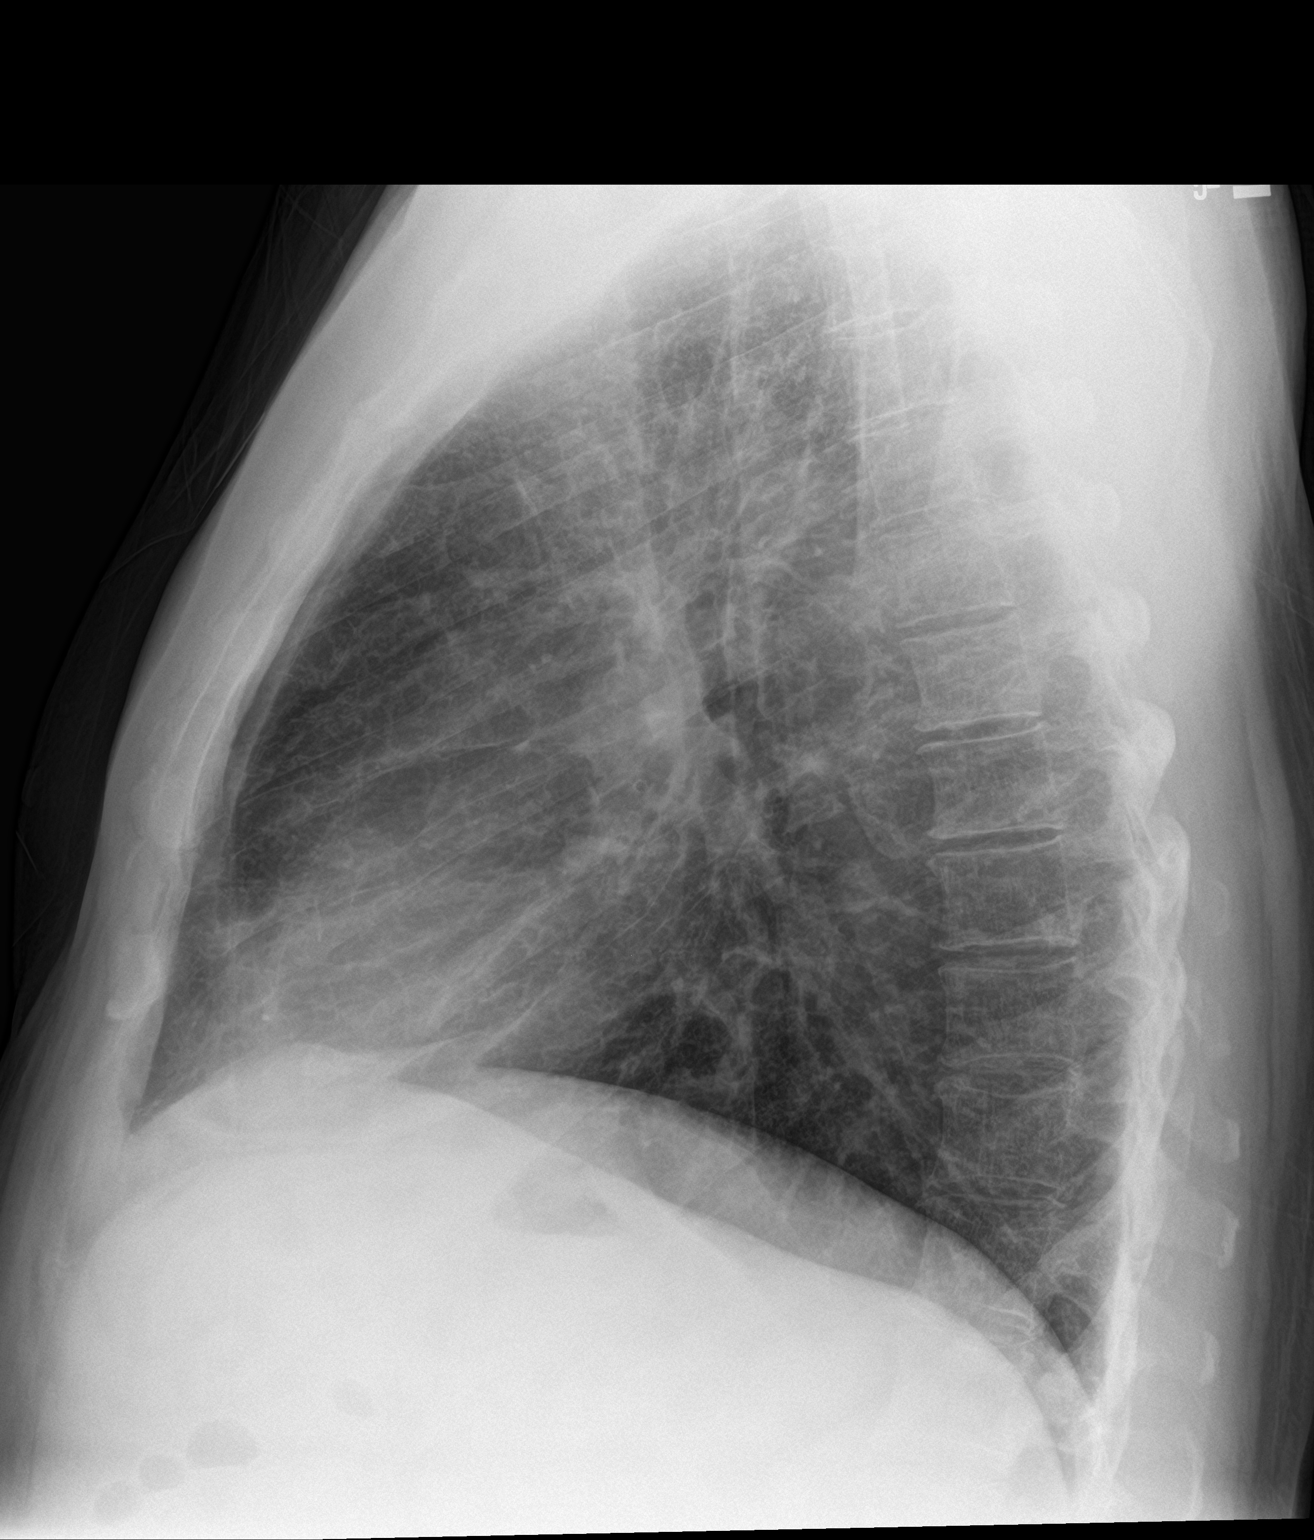

[2 of 2 positions shown; findings below may reference images not displayed]

FINDINGS: Stable mild chronic interstitial prominence. No new consolidation.
No pleural effusion or pneumothorax. Stable cardiomediastinal
contours with normal heart size.
IMPRESSION: No acute process in the chest.

## 2019-03-07 MED ORDER — ASPIRIN 81 MG PO CHEW
324.0000 mg | CHEWABLE_TABLET | Freq: Once | ORAL | Status: AC
  Administered 2019-03-07: 09:00:00 324 mg via ORAL
  Filled 2019-03-07: qty 4

## 2019-03-07 MED ORDER — IPRATROPIUM-ALBUTEROL 0.5-2.5 (3) MG/3ML IN SOLN
3.0000 mL | Freq: Once | RESPIRATORY_TRACT | Status: AC
  Administered 2019-03-07: 3 mL via RESPIRATORY_TRACT
  Filled 2019-03-07: qty 3

## 2019-03-07 MED ORDER — LISINOPRIL 10 MG PO TABS
40.0000 mg | ORAL_TABLET | Freq: Once | ORAL | Status: AC
  Administered 2019-03-07: 09:00:00 40 mg via ORAL
  Filled 2019-03-07: qty 4

## 2019-03-07 NOTE — Discharge Instructions (Signed)

## 2019-03-07 NOTE — ED Provider Notes (Signed)
Thus far work-up reassuring.  Patient reports no longer having any chest pain.  Negative D-dimer.  He does report he feels like he could use one of his breathing treatments, does use these occasionally.  On exam he does have some slight expiratory wheezing.  He is in no distress speaking in full clear sentences.  He does have a history of established COPD, and we will trial 1 nebulizer treatment.   Delman Kitten, MD 03/07/19 1002

## 2019-03-07 NOTE — ED Provider Notes (Signed)
[facility] Emergency Department Provider Note   ____________________________________________   First MD Initiated Contact with Patient 03/07/19 [phone]     (approximate)  I have reviewed the triage vital signs and the nursing notes.   HISTORY  Chief Complaint Chest Pain    HPI Darren Allen is a 53 y.o. male  with a history of treated diabetes, hypertension and obesity presents for evaluation of chest pain. Initial onset of pain was approximately 1-3 hours ago. The patient's chest pain is well-localized, is sharp and is not worse with exertion. The patient reports some diaphoresis. The patient's chest pain is middle- or left-sided, is not described as heaviness/pressure/tightness and does radiate to the arms/jaw/neck. The patient does not complain of nausea. The patient has smoked in the past 90 days. The patient has no history of stroke, has no history of peripheral artery disease, has no relevant family history of coronary artery disease (first degree relative at less than age 77) and has no history of hypercholesterolemia.     Sharp pain, located along the left lower rib cage.  Reports of the last 3 days he noticed a feeling of soreness in his left upper arm, does not recall injuring it.  Not sure if the 2 are connected.  Woke up from sleep at about 5 AM with a sharp discomfort in his left chest area  No history of blood clots.  No recent surgeries except for his ankle about a year ago.  No new leg swelling, gets a little bit of swelling in his left ankle from time to time since his surgery  No recent illness.  No fevers or chills.  Slight dry cough due to his COPD.  Denies difficulty breathing.  Reports that he has COPD that is fairly severe, but not acting up and does not seem to be causing him any symptoms at the moment.  Has not taken his medications yet today  Past Medical History:  Diagnosis Date  . 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 COPD (chronic  obstructive pulmonary disease) ([place])   . Diabetes mellitus without complication ([place])    type 2  . Hypertension     Patient Active Problem List   Diagnosis Date Noted  . Ankle fracture 03/23/2017  . Type 2 diabetes mellitus ([place]) 02/04/2015  . 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 Perianal abscess 02/04/2015  . Perirectal abscess   . Chronic obstructive pulmonary disease ([place]) 01/17/2013  . Acid reflux 01/17/2013  . HLD (hyperlipidemia) 01/17/2013  . Apnea, sleep 01/17/2013  . Testicular hypofunction 01/17/2013  . Allergic state 01/17/2013  . Adiposity 07/06/2011    Past Surgical History:  Procedure Laterality Date  . APPENDECTOMY    . INCISION AND DRAINAGE PERIRECTAL ABSCESS N/A 02/04/2015   Procedure: IRRIGATION AND DEBRIDEMENT PERIRECTAL ABSCESS;  Surgeon: Marlyce Huge, MD;  Location: [facility] ORS;  Service: General;  Laterality: N/A;  . ORIF ANKLE FRACTURE Left 03/23/2017   Procedure: OPEN REDUCTION INTERNAL FIXATION (ORIF) ANKLE FRACTURE;  Surgeon: Leim Fabry, MD;  Location: [facility] ORS;  Service: Orthopedics;  Laterality: Left;  . RECTAL EXAM UNDER ANESTHESIA  02/04/2015   Procedure: RECTAL EXAM UNDER ANESTHESIA;  Surgeon: Marlyce Huge, MD;  Location: [facility] ORS;  Service: General;;  . SYNDESMOSIS REPAIR Left 03/23/2017   Procedure: SYNDESMOSIS REPAIR;  Surgeon: Leim Fabry, MD;  Location: [facility] ORS;  Service: Orthopedics;  Laterality: Left;    Prior to Admission medications   Medication Sig Start Date End  Date Taking? Authorizing Provider  acetaminophen (TYLENOL) 500 MG tablet Take 2 tablets (1,000 mg total) by mouth every 8 (eight) hours. 03/24/17   Reche Dixon, PA-C  amoxicillin-clavulanate (AUGMENTIN) 875-125 MG tablet Take 1 tablet by mouth every 12 (twelve) hours. Patient not taking: Reported on 03/23/2017 02/06/15   Marlyce Huge, MD  aspirin EC 325 MG EC tablet Take 1 tablet (325 mg total) by mouth daily. 03/24/17   Reche Dixon,  PA-C  azithromycin (ZITHROMAX) 250 MG tablet TK UTD 03/18/17   [provider]  budesonide-formoterol (SYMBICORT) 80-4.5 MCG/ACT inhaler Inhale 2 puffs into the lungs 2 (two) times daily.    [provider]  fluconazole (DIFLUCAN) 200 MG tablet Take 1 tablet (200 mg total) by mouth daily. Patient not taking: Reported on 03/23/2017 02/15/15   Hubbard Robinson, MD  hydrochlorothiazide (HYDRODIURIL) 25 MG tablet Take 1 tablet by mouth daily. 02/26/17   [provider]  JANUVIA 50 MG tablet Take 1 tablet by mouth daily. 02/26/17   [provider]  lisinopril (PRINIVIL,ZESTRIL) 10 MG tablet Take 10 mg by mouth daily.    [provider]  lisinopril (PRINIVIL,ZESTRIL) 30 MG tablet Take 1 tablet (30 mg total) by mouth daily. Patient not taking: Reported on 03/23/2017 05/05/16 05/05/17  Gregor Hams, MD  lisinopril (PRINIVIL,ZESTRIL) 40 MG tablet Take 1 tablet by mouth daily. 02/26/17   [provider]  metFORMIN (GLUCOPHAGE-XR) 500 MG 24 hr tablet Take 4 tablets by mouth daily. 02/26/17   [provider]  nicotine (NICODERM CQ - DOSED IN MG/24 HOURS) 21 mg/24hr patch Place 1 patch onto the skin daily. 06/01/14   [provider]  omeprazole (PRILOSEC OTC) 20 MG tablet Take 20 mg by mouth 2 (two) times daily.    [provider]  ondansetron (ZOFRAN) 4 MG tablet Take 1 tablet (4 mg total) by mouth every 6 (six) hours as needed for nausea. 03/24/17   Reche Dixon, PA-C  oxyCODONE (OXY IR/ROXICODONE) 5 MG immediate release tablet Take 1-2 tablets (5-10 mg total) by mouth every 4 (four) hours as needed for breakthrough pain. 03/24/17   Reche Dixon, PA-C  pantoprazole (PROTONIX) 40 MG tablet Take 2 tablets by mouth daily. 03/03/17   [provider]  predniSONE (DELTASONE) 20 MG tablet Take 3 tablets (60 mg total) by mouth daily with breakfast. Patient not taking: Reported on 03/23/2017 02/11/15   Orbie Pyo, MD   predniSONE (STERAPRED UNI-PAK 48 TAB) 10 MG (48) TBPK tablet TK PO UTD 03/17/17   [provider]  Testosterone Cypionate 200 MG/ML KIT Inject 200 mg into the muscle every 14 (fourteen) days.    [provider]  tiotropium (SPIRIVA) 18 MCG inhalation capsule Place 18 mcg into inhaler and inhale daily.    [provider]  VENTOLIN HFA 108 (90 Base) MCG/ACT inhaler Inhale 2 puffs into the lungs every 6 (six) hours as needed. 03/22/17   [provider]    Allergies Glipizide  Family History  Problem Relation Age of Onset  . Cancer Mother 14       Lung  . Cancer Father        Colon  . Heart disease Father   . Alcohol abuse Father   . Cancer Brother 31       Esophageal  . Diabetes Brother   . Heart disease Brother     Social History Social History   Tobacco Use  . Smoking status: Current Every Day Smoker  Packs/day: 0.50    Years: 34.00    Pack years: 17.00    Types: Cigarettes  . Smokeless tobacco: Never Used  Substance Use Topics  . Alcohol use: Yes    Alcohol/week: 12.0 standard drinks    Types: 12 Cans of beer per week    Comment: varies- only drinks on weekend  . Drug use: No    Review of Systems Constitutional: No fever/chills denies Covid exposure Eyes: No visual changes. ENT: No sore throat. Cardiovascular: See HPI.  Pain is better now it is about a 2 out of 10 slightly sharp located along the left lower rib cage Respiratory: Denies shortness of breath. Gastrointestinal: No abdominal pain.   Genitourinary: Negative for dysuria. Musculoskeletal: Negative for back pain. Skin: Negative for rash. Neurological: Negative for headaches, areas of focal weakness or numbness.    ____________________________________________   PHYSICAL EXAM:  VITAL SIGNS: ED Triage Vitals  Enc Vitals Group     BP 03/07/19 0747 (!) 160/85     Pulse Rate 03/07/19 0747 95     Resp 03/07/19 0747 18     Temp 03/07/19 0747 98.4 F (36.9 C)      Temp Source 03/07/19 0747 Oral     SpO2 03/07/19 0747 98 %     Weight 03/07/19 0745 225 lb (102.1 kg)     Height 03/07/19 0745 6' 2" (1.88 m)     Head Circumference --      Peak Flow --      Pain Score 03/07/19 0744 3     Pain Loc --      Pain Edu? --      Excl. in [place]? --     Constitutional: Alert and oriented. Well appearing and in no acute distress. Eyes: Conjunctivae are normal. Head: Atraumatic. Nose: No congestion/rhinnorhea. Mouth/Throat: Mucous membranes are moist. Neck: No stridor.  Cardiovascular: Normal rate, regular rhythm. Grossly normal heart sounds.  Good peripheral circulation. Respiratory: Normal respiratory effort.  No retractions. Lungs CTAB. Gastrointestinal: Soft and nontender. No distention. Musculoskeletal: No lower extremity tenderness nor edema. Neurologic:  Normal speech and language. No gross focal neurologic deficits are appreciated.  Skin:  Skin is warm, dry and intact. No rash noted. Psychiatric: Mood and affect are normal. Speech and behavior are normal.  ____________________________________________   LABS (all labs ordered are listed, but only abnormal results are displayed)  Labs Reviewed  CBC - Abnormal; Notable for the following components:      Result Value   MCV 76.3 (*)    MCH 25.0 (*)    All other components within normal limits  BASIC METABOLIC PANEL - Abnormal; Notable for the following components:   Sodium 134 (*)    Glucose, Bld 277 (*)    All other components within normal limits  SARS CORONAVIRUS 2 (TAT 6-24 HRS)  FIBRIN DERIVATIVES D-DIMER ([facility] ONLY)  TROPONIN I (HIGH SENSITIVITY)  TROPONIN I (HIGH SENSITIVITY)   ____________________________________________  EKG  ED ECG REPORT I, Mark Quale, the attending physician, personally viewed and interpreted this ECG.  Date: 03/07/2019 EKG Time: 745 Rate: 90 Rhythm: normal sinus rhythm QRS Axis: normal Intervals: normal ST/T Wave abnormalities: normal Narrative  Interpretation: no evidence of acute ischemia  ____________________________________________  RADIOLOGY  DG Chest 2 View  Result Date: 03/07/2019 CLINICAL DATA:  Left chest pain radiating into left arm EXAM: CHEST - 2 VIEW COMPARISON:  03/22/2017 FINDINGS: Stable mild chronic interstitial prominence. No new consolidation. No pleural effusion or pneumothorax. Stable cardiomediastinal contours   with normal heart size. IMPRESSION: No acute process in the chest. Electronically Signed   By: Macy Mis M.D.   On: 03/07/2019 08:29     ____________________________________________   PROCEDURES  Procedure(s) performed: None  Procedures  Critical Care performed: No  ____________________________________________   INITIAL IMPRESSION / ASSESSMENT AND PLAN / ED COURSE  Pertinent labs & imaging results that were available during my care of the patient were reviewed by me and considered in my medical decision making (see chart for details).   Differential diagnosis includes, but is not limited to, ACS, aortic dissection, pulmonary embolism, cardiac tamponade, pneumothorax, pneumonia, pericarditis, myocarditis, GI-related causes including esophagitis/gastritis, and musculoskeletal chest wall pain.    No ripping tearing or moving component pain.  No radiation of the back.  No shortness of breath associated.  No new leg swelling just occasional slight left ankle swelling since surgery.  No history of PEs.  Will screen for PE using D-dimer < 500.  Low risk by [place]  Overall, clinical history of a sharp discomfort seem to suggest likely noncardiac in etiology.  Denies acute infectious symptoms.  Will screen for Covid given its association with some chest pain not overtly obvious infection.  Provide aspirin, discussed with patient comfortable observing him and will plan to do serial troponin testing due to his multiple cardiac risk factors   HEAR Score: 3    ----------------------------------------- 12:18 PM on 03/07/2019 -----------------------------------------  Patient asymptomatic.  Comfortable plan for discharge.  Normal respirations, clear respirations without distress.  Agreeable with outpatient follow-up and careful chest pain return precautions  Return precautions and treatment recommendations and follow-up discussed with the patient who is agreeable with the plan.       ____________________________________________   FINAL CLINICAL IMPRESSION(S) / ED DIAGNOSES  Final diagnoses:  Chest pain, atypical        Note:  This document was prepared using Dragon voice recognition software and may include unintentional dictation errors       Delman Kitten, MD 03/07/19 1218

## 2019-03-07 NOTE — ED Triage Notes (Signed)
Pt c/o left sided chest pain that radiates into the left arm with SOB since last night. Pt is in NAD at present. Respirations WNL.

## 2019-09-25 ENCOUNTER — Emergency Department: Payer: Medicaid Other

## 2019-09-25 ENCOUNTER — Emergency Department
Admission: EM | Admit: 2019-09-25 | Discharge: 2019-09-26 | Disposition: A | Discharge status: Home / Self Care | Payer: Medicaid Other | Attending: Emergency Medicine | Admitting: Emergency Medicine

## 2019-09-25 ENCOUNTER — Other Ambulatory Visit: Payer: Self-pay

## 2019-09-25 ENCOUNTER — Encounter: Payer: Self-pay | Admitting: Emergency Medicine

## 2019-09-25 DIAGNOSIS — R55 Syncope and collapse: Secondary | ICD-10-CM | POA: Diagnosis not present

## 2019-09-25 DIAGNOSIS — N289 Disorder of kidney and ureter, unspecified: Secondary | ICD-10-CM | POA: Diagnosis not present

## 2019-09-25 DIAGNOSIS — I10 Essential (primary) hypertension: Secondary | ICD-10-CM | POA: Insufficient documentation

## 2019-09-25 DIAGNOSIS — E119 Type 2 diabetes mellitus without complications: Secondary | ICD-10-CM | POA: Insufficient documentation

## 2019-09-25 DIAGNOSIS — F17210 Nicotine dependence, cigarettes, uncomplicated: Secondary | ICD-10-CM | POA: Insufficient documentation

## 2019-09-25 DIAGNOSIS — Z7984 Long term (current) use of oral hypoglycemic drugs: Secondary | ICD-10-CM | POA: Insufficient documentation

## 2019-09-25 DIAGNOSIS — N179 Acute kidney failure, unspecified: Secondary | ICD-10-CM

## 2019-09-25 DIAGNOSIS — E860 Dehydration: Secondary | ICD-10-CM | POA: Diagnosis not present

## 2019-09-25 DIAGNOSIS — Z7982 Long term (current) use of aspirin: Secondary | ICD-10-CM | POA: Diagnosis not present

## 2019-09-25 DIAGNOSIS — Z79899 Other long term (current) drug therapy: Secondary | ICD-10-CM | POA: Diagnosis not present

## 2019-09-25 DIAGNOSIS — J449 Chronic obstructive pulmonary disease, unspecified: Secondary | ICD-10-CM | POA: Insufficient documentation

## 2019-09-25 LAB — CK: Total CK: 477 U/L — ABNORMAL HIGH (ref 49–397)

## 2019-09-25 LAB — BASIC METABOLIC PANEL
Anion gap: 16 — ABNORMAL HIGH (ref 5–15)
Anion gap: 13 (ref 5–15)
Anion gap: 9 (ref 5–15)
BUN: 22 mg/dL — ABNORMAL HIGH (ref 6–20)
BUN: 26 mg/dL — ABNORMAL HIGH (ref 6–20)
BUN: 26 mg/dL — ABNORMAL HIGH (ref 6–20)
CO2: 20 mmol/L — ABNORMAL LOW (ref 22–32)
CO2: 20 mmol/L — ABNORMAL LOW (ref 22–32)
CO2: 23 mmol/L (ref 22–32)
Calcium: 9.9 mg/dL (ref 8.9–10.3)
Calcium: 9.4 mg/dL (ref 8.9–10.3)
Calcium: 8.7 mg/dL — ABNORMAL LOW (ref 8.9–10.3)
Chloride: 101 mmol/L (ref 98–111)
Chloride: 103 mmol/L (ref 98–111)
Chloride: 106 mmol/L (ref 98–111)
Creatinine, Ser: 2.47 mg/dL — ABNORMAL HIGH (ref 0.61–1.24)
Creatinine, Ser: 2.47 mg/dL — ABNORMAL HIGH (ref 0.61–1.24)
Creatinine, Ser: 2.21 mg/dL — ABNORMAL HIGH (ref 0.61–1.24)
GFR calc Af Amer: 33 mL/min — ABNORMAL LOW (ref >60)
GFR calc Af Amer: 33 mL/min — ABNORMAL LOW (ref >60)
GFR calc Af Amer: 37 mL/min — ABNORMAL LOW (ref >60)
GFR calc non Af Amer: 28 mL/min — ABNORMAL LOW (ref >60)
GFR calc non Af Amer: 28 mL/min — ABNORMAL LOW (ref >60)
GFR calc non Af Amer: 32 mL/min — ABNORMAL LOW (ref >60)
Glucose, Bld: 142 mg/dL — ABNORMAL HIGH (ref 70–99)
Glucose, Bld: 176 mg/dL — ABNORMAL HIGH (ref 70–99)
Glucose, Bld: 149 mg/dL — ABNORMAL HIGH (ref 70–99)
Potassium: 4.2 mmol/L (ref 3.5–5.1)
Potassium: 3.8 mmol/L (ref 3.5–5.1)
Potassium: 4.1 mmol/L (ref 3.5–5.1)
Sodium: 137 mmol/L (ref 135–145)
Sodium: 136 mmol/L (ref 135–145)
Sodium: 138 mmol/L (ref 135–145)

## 2019-09-25 LAB — URINALYSIS, COMPLETE (UACMP) WITH MICROSCOPIC
Bacteria, UA: NONE SEEN
Bilirubin Urine: NEGATIVE
Glucose, UA: 50 mg/dL — AB
Hgb urine dipstick: NEGATIVE
Ketones, ur: 5 mg/dL — AB
Nitrite: NEGATIVE
Protein, ur: 100 mg/dL — AB
Specific Gravity, Urine: 1.03 (ref 1.005–1.030)
pH: 5 (ref 5.0–8.0)

## 2019-09-25 LAB — CBC
HCT: 43.7 % (ref 39.0–52.0)
Hemoglobin: 14.5 g/dL (ref 13.0–17.0)
MCH: 26.1 pg (ref 26.0–34.0)
MCHC: 33.2 g/dL (ref 30.0–36.0)
MCV: 78.7 fL — ABNORMAL LOW (ref 80.0–100.0)
Platelets: 279 10*3/uL (ref 150–400)
RBC: 5.55 MIL/uL (ref 4.22–5.81)
RDW: 15.2 % (ref 11.5–15.5)
WBC: 11.8 10*3/uL — ABNORMAL HIGH (ref 4.0–10.5)
nRBC: 0 % (ref 0.0–0.2)

## 2019-09-25 LAB — TROPONIN I (HIGH SENSITIVITY): Troponin I (High Sensitivity): 11 ng/L (ref <18)

## 2019-09-25 IMAGING — US US RENAL
1 series · 14 of 22 positions shown · non-contrast
Comparison: Abdominal CT [DATE].

CLINICAL DATA: Acute kidney injury.

EXAM:
RENAL / URINARY TRACT ULTRASOUND COMPLETE

[Series 1: us renal · 14 of 22 slices shown]
[im 1/22]
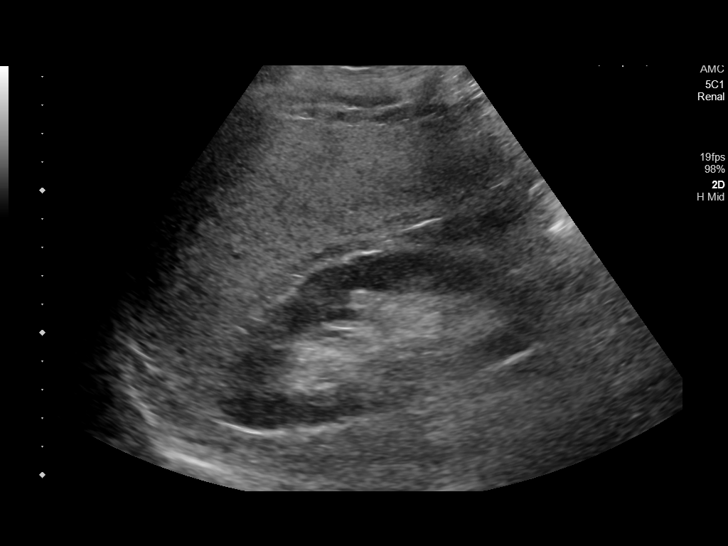
[im 3/22]
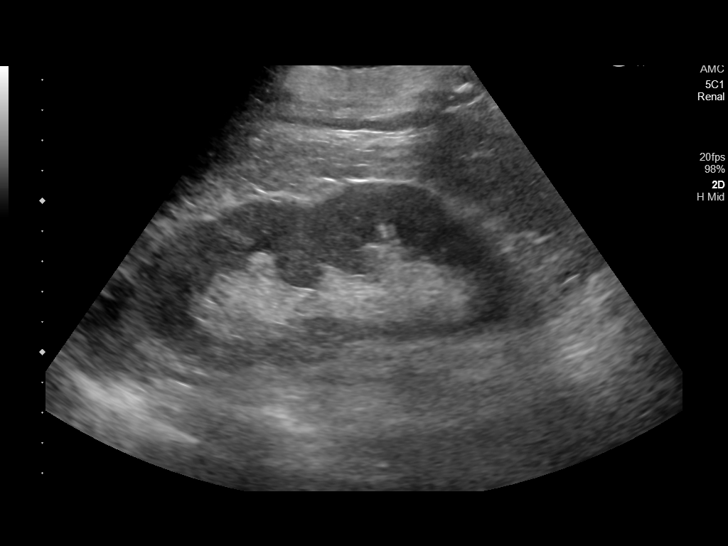
[im 4/22]
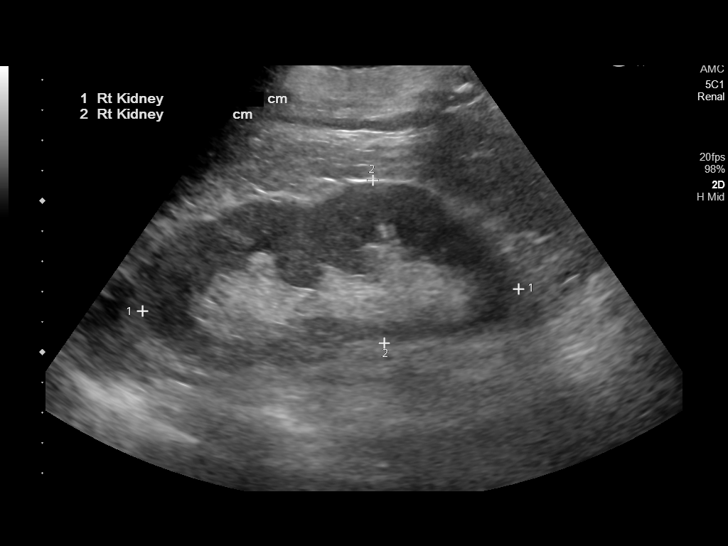
[im 6/22]
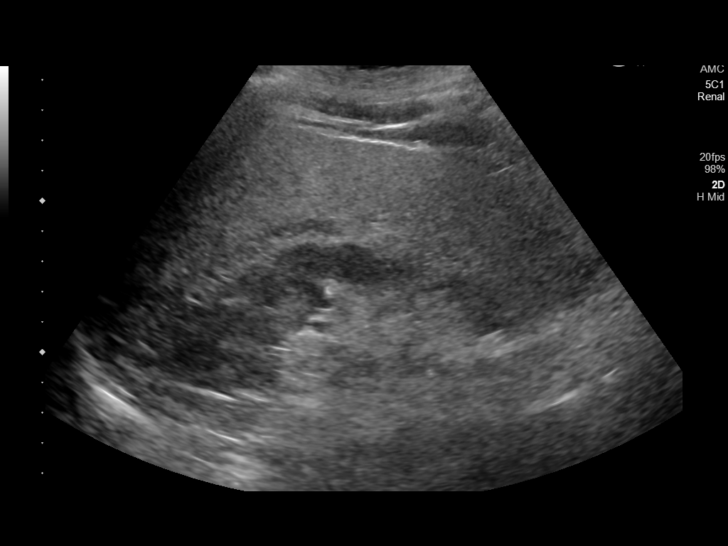
[im 8/22]
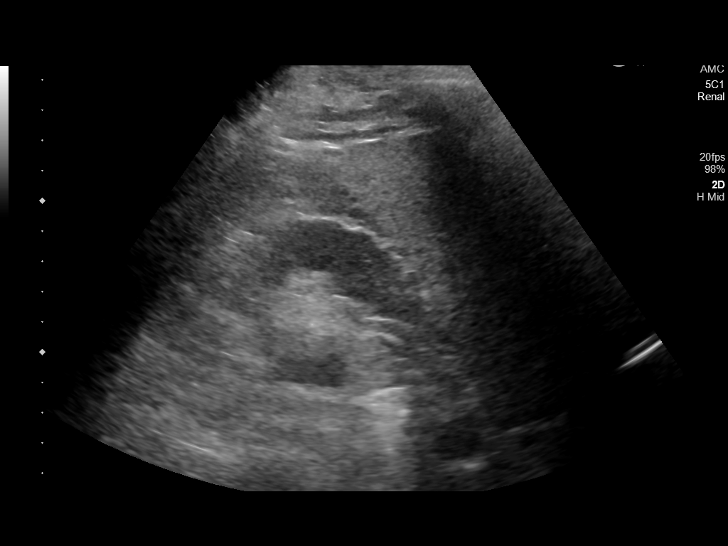
[im 9/22]
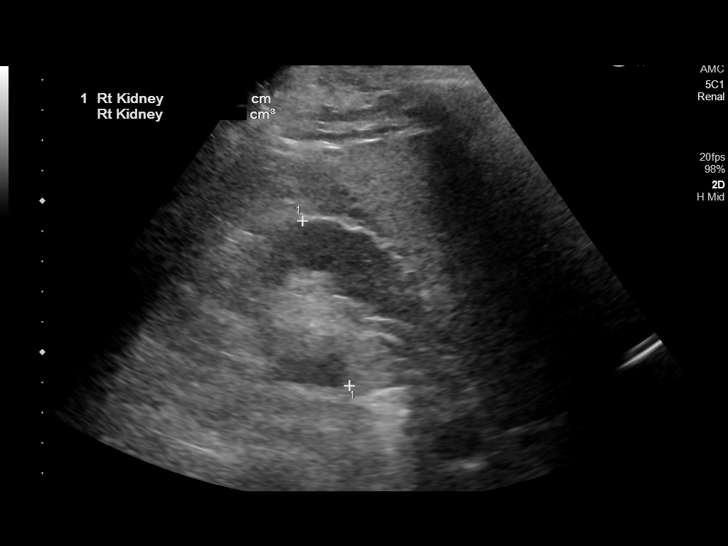
[im 11/22]
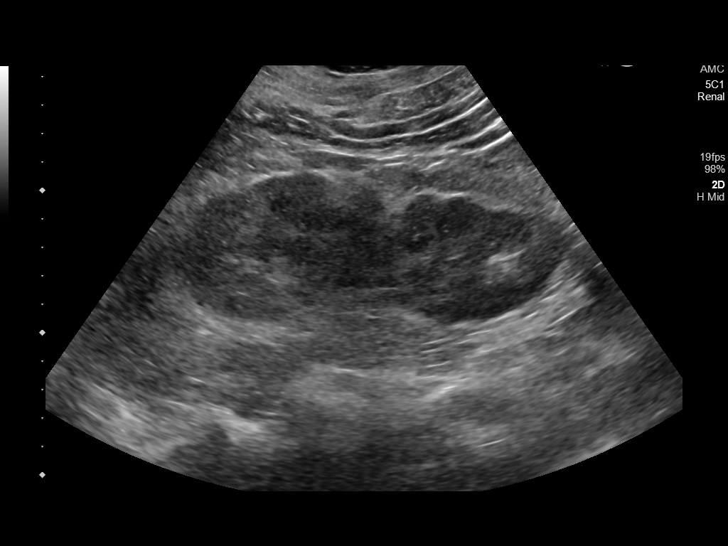
[im 12/22]
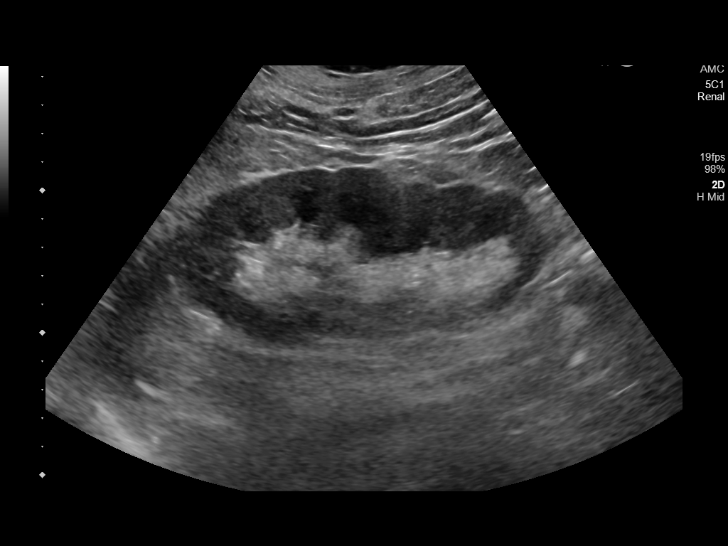
[im 14/22]
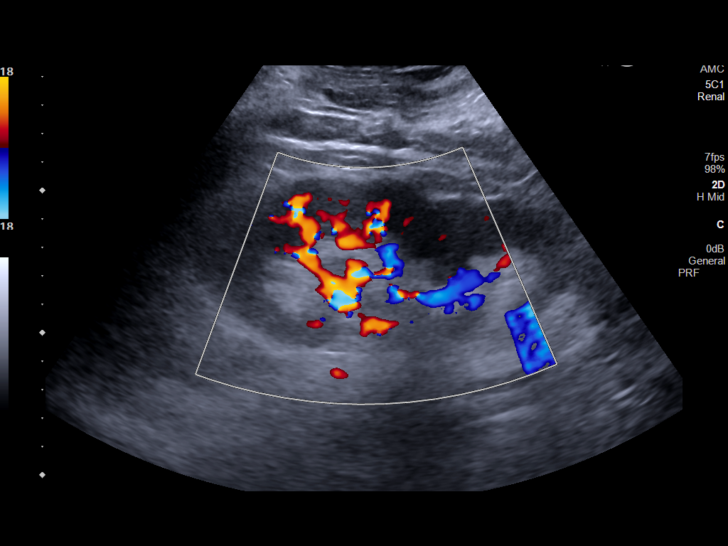
[im 15/22]
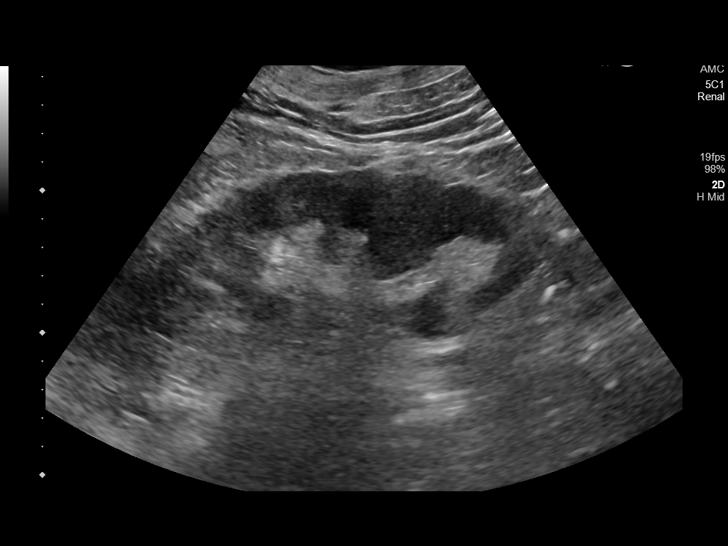
[im 17/22]
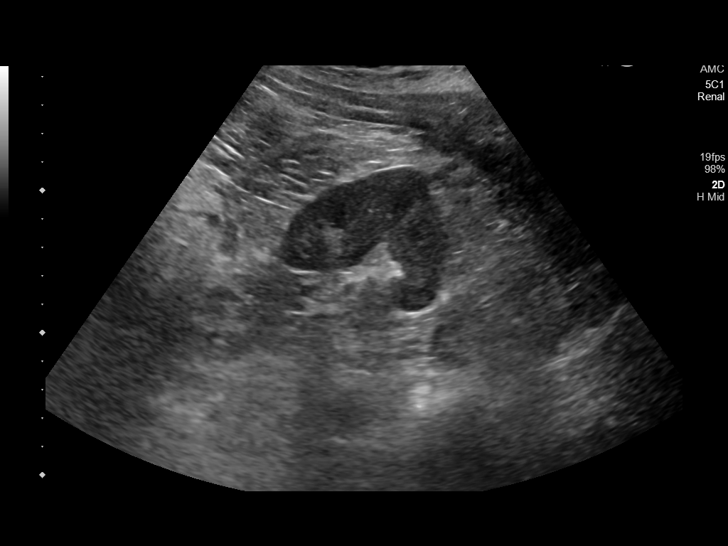
[im 19/22]
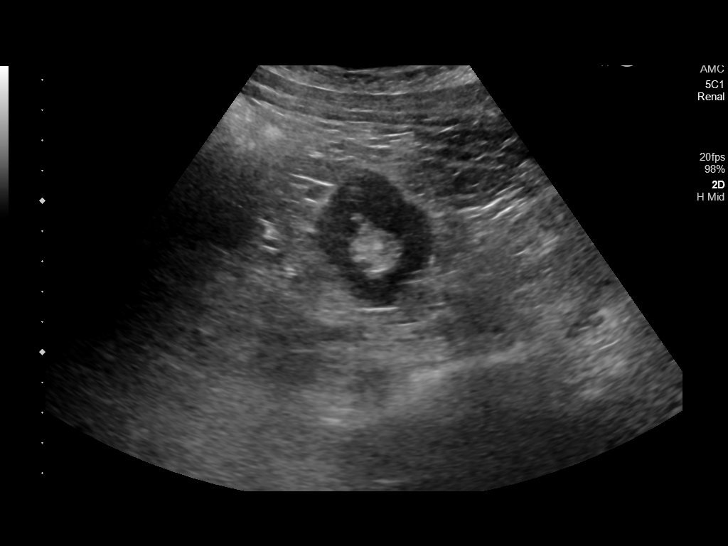
[im 20/22]
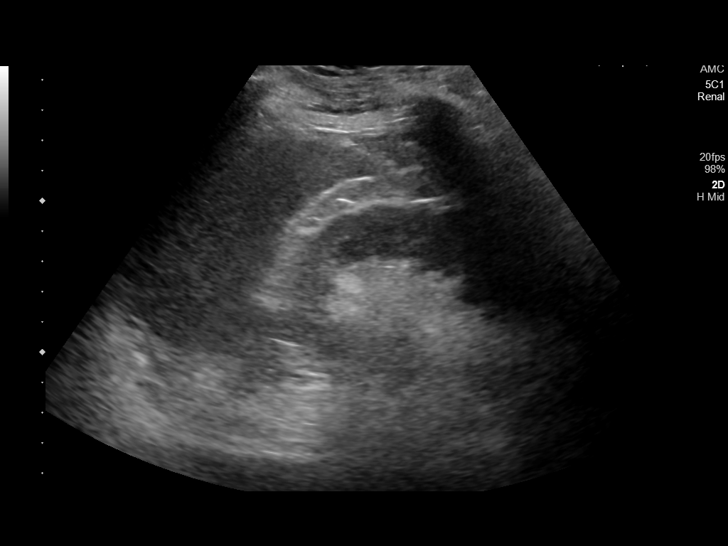
[im 22/22]
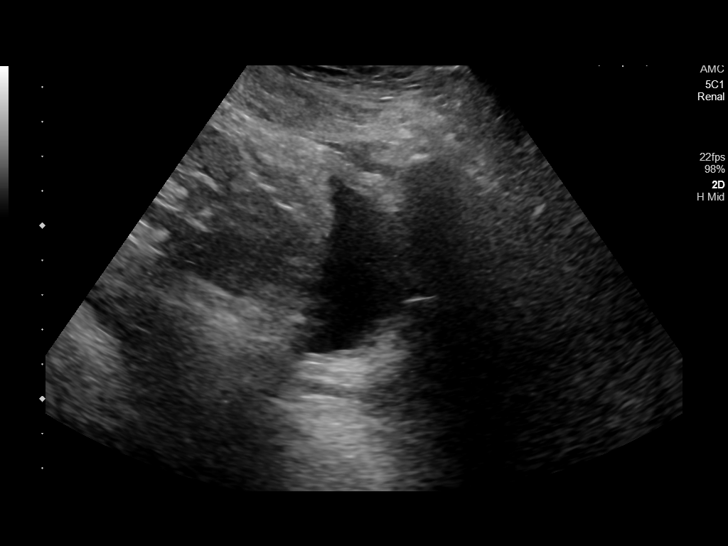

[14 of 22 positions shown; findings below may reference images not displayed]

FINDINGS: Right Kidney:

Renal measurements: 12.5 x 5.4 x 5.7 cm = volume: 199 mL.
Echogenicity within normal limits. No mass or hydronephrosis
visualized. No visualized renal calculi.

Left Kidney:

Renal measurements: 13.3 x 6.2 x 5.8 cm = volume: 250 mL.
Echogenicity within normal limits. No mass or hydronephrosis
visualized. No visualized renal calculi.

Bladder:

Only minimally distended. Appears normal for degree of bladder
distention.

Other:

Incidental note of increased hepatic echogenicity most consistent
with hepatic steatosis.
IMPRESSION: 1. Unremarkable sonographic appearance of the kidneys. No
obstructive uropathy.
2. Incidental note of hepatic steatosis.

## 2019-09-25 MED ORDER — SODIUM CHLORIDE 0.9 % IV BOLUS
1000.0000 mL | Freq: Once | INTRAVENOUS | Status: AC
  Administered 2019-09-25: 1000 mL via INTRAVENOUS

## 2019-09-25 MED ORDER — SODIUM CHLORIDE 0.9% FLUSH: 3.0000 mL | Freq: Once | INTRAVENOUS | Status: DC

## 2019-09-25 NOTE — ED Provider Notes (Signed)
[facility] Emergency Department Provider Note  Time seen: 7:55 PM  I have reviewed the triage vital signs and the nursing notes.   HISTORY  Chief Complaint Weakness   HPI Darren Allen is a 54 y.o. male with a past medical history of COPD, diabetes, hypertension presents emergency department for a near syncope episode.  According to the patient he was working out in the yard today for problem an hour per patient when he began feeling very lightheaded and dizzy.  Patient states he laid down but continued to feel lightheaded and dizzy.  Went inside and took his blood pressure and it was in the 70s per patient.  Patient denies any recent illnesses fever cough congestion shortness of breath chest pain vomiting or diarrhea.   Past Medical History:  Diagnosis Date  . 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 COPD (chronic obstructive pulmonary disease) ([facility])   . Diabetes mellitus without complication ([facility])    type 2  . Hypertension     Patient Active Problem List   Diagnosis Date Noted  . Ankle fracture 03/23/2017  . Type 2 diabetes mellitus ([facility]) 02/04/2015  . 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 Perianal abscess 02/04/2015  . Perirectal abscess   . Chronic obstructive pulmonary disease ([facility]) 01/17/2013  . Acid reflux 01/17/2013  . HLD (hyperlipidemia) 01/17/2013  . Apnea, sleep 01/17/2013  . Testicular hypofunction 01/17/2013  . Allergic state 01/17/2013  . Adiposity 07/06/2011    Past Surgical History:  Procedure Laterality Date  . APPENDECTOMY    . INCISION AND DRAINAGE PERIRECTAL ABSCESS N/A 02/04/2015   Procedure: IRRIGATION AND DEBRIDEMENT PERIRECTAL ABSCESS;  Surgeon: Ida Rogue, MD;  Location: [facility] ORS;  Service: General;  Laterality: N/A;  . ORIF ANKLE FRACTURE Left 03/23/2017   Procedure: OPEN REDUCTION INTERNAL FIXATION (ORIF) ANKLE FRACTURE;  Surgeon: Signa Kell, MD;  Location: [facility] ORS;  Service: Orthopedics;   Laterality: Left;  . RECTAL EXAM UNDER ANESTHESIA  02/04/2015   Procedure: RECTAL EXAM UNDER ANESTHESIA;  Surgeon: Ida Rogue, MD;  Location: [facility] ORS;  Service: General;;  . SYNDESMOSIS REPAIR Left 03/23/2017   Procedure: SYNDESMOSIS REPAIR;  Surgeon: Signa Kell, MD;  Location: [facility] ORS;  Service: Orthopedics;  Laterality: Left;    Prior to Admission medications   Medication Sig Start Date End Date Taking? Authorizing Provider  acetaminophen (TYLENOL) 500 MG tablet Take 2 tablets (1,000 mg total) by mouth every 8 (eight) hours. 03/24/17   Dedra Skeens, PA-C  amoxicillin-clavulanate (AUGMENTIN) 875-125 MG tablet Take 1 tablet by mouth every 12 (twelve) hours. Patient not taking: Reported on 03/23/2017 02/06/15   Ida Rogue, MD  aspirin EC 325 MG EC tablet Take 1 tablet (325 mg total) by mouth daily. 03/24/17   Dedra Skeens, PA-C  azithromycin (ZITHROMAX) 250 MG tablet TK UTD 03/18/17   [provider]  budesonide-formoterol (SYMBICORT) 80-4.5 MCG/ACT inhaler Inhale 2 puffs into the lungs 2 (two) times daily.    [provider]  fluconazole (DIFLUCAN) 200 MG tablet Take 1 tablet (200 mg total) by mouth daily. Patient not taking: Reported on 03/23/2017 02/15/15   Gladis Riffle, MD  hydrochlorothiazide (HYDRODIURIL) 25 MG tablet Take 1 tablet by mouth daily. 02/26/17   [provider]  JANUVIA 50 MG tablet Take 1 tablet by mouth daily. 02/26/17   [provider]  lisinopril (PRINIVIL,ZESTRIL) 10 MG tablet Take 10 mg by mouth daily.    [provider]  lisinopril (PRINIVIL,ZESTRIL) 30 MG  tablet Take 1 tablet (30 mg total) by mouth daily. Patient not taking: Reported on 03/23/2017 05/05/16 05/05/17  Gregor Hams, MD  lisinopril (PRINIVIL,ZESTRIL) 40 MG tablet Take 1 tablet by mouth daily. 02/26/17   [provider]  metFORMIN (GLUCOPHAGE-XR) 500 MG 24 hr tablet Take 4 tablets by mouth daily. 02/26/17   [provider]   nicotine (NICODERM CQ - DOSED IN MG/24 HOURS) 21 mg/24hr patch Place 1 patch onto the skin daily. 06/01/14   [provider]  omeprazole (PRILOSEC OTC) 20 MG tablet Take 20 mg by mouth 2 (two) times daily.    [provider]  ondansetron (ZOFRAN) 4 MG tablet Take 1 tablet (4 mg total) by mouth every 6 (six) hours as needed for nausea. 03/24/17   Reche Dixon, PA-C  oxyCODONE (OXY IR/ROXICODONE) 5 MG immediate release tablet Take 1-2 tablets (5-10 mg total) by mouth every 4 (four) hours as needed for breakthrough pain. 03/24/17   Reche Dixon, PA-C  pantoprazole (PROTONIX) 40 MG tablet Take 2 tablets by mouth daily. 03/03/17   [provider]  predniSONE (DELTASONE) 20 MG tablet Take 3 tablets (60 mg total) by mouth daily with breakfast. Patient not taking: Reported on 03/23/2017 02/11/15   Orbie Pyo, MD  predniSONE (STERAPRED UNI-PAK 48 TAB) 10 MG (48) TBPK tablet TK PO UTD 03/17/17   [provider]  Testosterone Cypionate 200 MG/ML KIT Inject 200 mg into the muscle every 14 (fourteen) days.    [provider]  tiotropium (SPIRIVA) 18 MCG inhalation capsule Place 18 mcg into inhaler and inhale daily.    [provider]  VENTOLIN HFA 108 (90 Base) MCG/ACT inhaler Inhale 2 puffs into the lungs every 6 (six) hours as needed. 03/22/17   [provider]    Allergies  Allergen Reactions  . Glipizide Other (See Comments)    Shaky, feel bad    Family History  Problem Relation Age of Onset  . Cancer Mother 67       Lung  . Cancer Father        Colon  . Heart disease Father   . Alcohol abuse Father   . Cancer Brother 37       Esophageal  . Diabetes Brother   . Heart disease Brother     Social History Social History   Tobacco Use  . Smoking status: Current Every Day Smoker    Packs/day: 0.50    Years: 34.00    Pack years: 17.00    Types: Cigarettes  . Smokeless tobacco: Never Used  Substance Use Topics  . Alcohol  use: Yes    Alcohol/week: 12.0 standard drinks    Types: 12 Cans of beer per week    Comment: varies- only drinks on weekend  . Drug use: No    Review of Systems Constitutional: Negative for fever. Cardiovascular: Negative for chest pain. Respiratory: Negative for shortness of breath. Gastrointestinal: Negative for abdominal pain, vomiting Genitourinary: States urgency but no significant frequency or dysuria. Musculoskeletal: Negative for musculoskeletal complaints Neurological: Negative for headache All other ROS negative  ____________________________________________   PHYSICAL EXAM:  VITAL SIGNS: ED Triage Vitals  Enc Vitals Group     BP 09/25/19 1702 100/75     Pulse Rate 09/25/19 1702 (!) 117     Resp 09/25/19 1702 16     Temp 09/25/19 1702 98.1 F (36.7 C)     Temp Source 09/25/19 1702 Oral     SpO2 09/25/19 1702  97 %     Weight 09/25/19 1703 232 lb (105.2 kg)     Height 09/25/19 1703 '6\' 2"'$  (1.88 m)     Head Circumference --      Peak Flow --      Pain Score 09/25/19 1702 0     Pain Loc --      Pain Edu? --      Excl. in [place]? --     Constitutional: Alert and oriented. Well appearing and in no distress. Eyes: Normal exam ENT      Head: Normocephalic and atraumatic.      Mouth/Throat: Mucous membranes are moist. Cardiovascular: Normal rate, regular rhythm.  Respiratory: Normal respiratory effort without tachypnea nor retractions. Breath sounds are clear  Gastrointestinal: Soft and nontender. No distention.  Musculoskeletal: Nontender with normal range of motion in all extremities.  Neurologic:  Normal speech and language. No gross focal neurologic deficits  Skin:  Skin is warm, dry and intact.  Psychiatric: Mood and affect are normal.   ____________________________________________    EKG  EKG viewed and interpreted by myself shows sinus tachycardia 115 bpm with a narrow QRS, normal axis, normal intervals, no concerning ST  changes.  ____________________________________________    RADIOLOGY  Ultrasound negative  ____________________________________________   INITIAL IMPRESSION / ASSESSMENT AND PLAN / ED COURSE  Pertinent labs & imaging results that were available during my care of the patient were reviewed by me and considered in my medical decision making (see chart for details).   Patient presents emergency department for lightheadedness dizziness and near syncopal episode earlier today.  Patient's lab work shows acute renal insufficiency with a creatinine of 2.47 baseline creatinine around 1.0.  Patient's anion gap is 16 with a bicarb of 20 seems consistent with dehydration.  Reassuringly glucose seems fairly well controlled at 142.  The remainder of the patient's lab work is largely [place].  I have added on a CK as well as a troponin as a precaution.  We will repeat a troponin as well.  We will IV hydrate.  Patient does state a sense of urinary urgency but unable to urinate a large volume.  We will obtain a renal ultrasound and bladder scan.  Anion gap is improved with fluids.  Creatinine is downtrending now 2.2 with a baseline around 1.2 per record review.  Patient is feeling much better blood pressures are much improved.  We will discharge the patient home with continued oral hydration.  Patient has good PCP follow-up and will follow up in 2 to 3 days for recheck of his labs after significant oral hydration at home.  I discussed return precautions.  Patient agreeable to plan of care.  CK is elevated 477 prior to IV hydration.  Troponin of 11.  Renal ultrasound nonrevealing.  Darren Allen was evaluated in Emergency Department on 09/25/2019 for the symptoms described in the history of present illness. He was evaluated in the context of the global COVID-19 pandemic, which necessitated consideration that the patient might be at risk for infection with the SARS-CoV-2 virus that causes COVID-19.  Institutional protocols and algorithms that pertain to the evaluation of patients at risk for COVID-19 are in a state of rapid change based on information released by regulatory bodies including the CDC and federal and state organizations. These policies and algorithms were followed during the patient's care in the ED.  ____________________________________________   FINAL CLINICAL IMPRESSION(S) / ED DIAGNOSES  Near syncope Dehydration Acute renal insufficiency   Harvest Dark,  MD 09/25/19 2302

## 2019-09-25 NOTE — ED Notes (Signed)
Pt and visitor given warm blankets.

## 2019-09-25 NOTE — ED Notes (Signed)
Pt resting calmly in bed. Bed locked low. Rail up. Visitor remains with pt.

## 2019-09-25 NOTE — ED Triage Notes (Signed)
First Nurse Note:  C/O not feeling well after being outside.  EMS reports patient was orthostatic.  500 cc NS bolus given, VS improved.  18 ga to [place].  Patient is AAOx3. Skin warm and dry.  CBG:  99

## 2019-09-25 NOTE — ED Notes (Addendum)
Attempted for 2nd trop with butterfly at R fa. Will allow imaging to be completed and will try again. Girlfriend at bedside.

## 2019-09-25 NOTE — ED Notes (Signed)
This RN to draw repeat BMP once 2nd bolus finished. Pt's visitor remains at bedside.

## 2019-09-25 NOTE — ED Triage Notes (Signed)
Says he was outside for about 15 min picking up sticks in yard and then he felt weak and lost all his energy.  He got diaphoretic and stayed that way even inside with [facility] on.  Says he did not have any chest pain today, but has had in past and has seen dr Ubaldo Glassing.  Says he does feel dizzy even now.

## 2019-09-25 NOTE — ED Notes (Signed)
L ac IV won't pull back enough blood for 2nd trop.

## 2019-09-25 NOTE — ED Notes (Signed)
Pt reports last few mornings he has woken up with L-sided chest pain that lasted about 71mins. Also c/o dec urination throughout day but inc urination during the night. Notified EDP Paduchowski.

## 2019-09-25 NOTE — ED Notes (Signed)
Pt reports after a few mins outside today he became excessively sweaty, had bilateral jaw pain/stiffness, neck pain, and SOB that wouldn't go away after sitting inside with ac for an hour. Denies CP. Resp reg/unlabored currently. Steady walking to stretcher on A side of ER from triage. Calm. Requesting that girlfriend be able to come back to bedside. Called first nurse to notify girlfriend okay to come back once screened.

## 2019-09-25 NOTE — ED Notes (Signed)
EDP Paduchowski at bedside.  

## 2019-09-25 NOTE — Discharge Instructions (Addendum)
As we discussed please drink plenty of fluids over the next 2 to 3 days.  Please follow-up with your doctor in 2 days for recheck of your labs and reevaluation.  Return to the emergency department for any further episodes of weakness or if you feel like you are going to pass out.

## 2019-11-28 DIAGNOSIS — M2040 Other hammer toe(s) (acquired), unspecified foot: Secondary | ICD-10-CM | POA: Insufficient documentation

## 2019-12-07 ENCOUNTER — Other Ambulatory Visit: Payer: Self-pay

## 2019-12-07 ENCOUNTER — Emergency Department
Admission: EM | Admit: 2019-12-07 | Discharge: 2019-12-08 | Disposition: A | Discharge status: Home / Self Care | Payer: Medicaid Other | Attending: Emergency Medicine | Admitting: Emergency Medicine

## 2019-12-07 ENCOUNTER — Encounter: Payer: Self-pay | Admitting: *Deleted

## 2019-12-07 DIAGNOSIS — E0965 Drug or chemical induced diabetes mellitus with hyperglycemia: Secondary | ICD-10-CM | POA: Insufficient documentation

## 2019-12-07 DIAGNOSIS — Z20822 Contact with and (suspected) exposure to covid-19: Secondary | ICD-10-CM | POA: Diagnosis not present

## 2019-12-07 DIAGNOSIS — T380X5A Adverse effect of glucocorticoids and synthetic analogues, initial encounter: Secondary | ICD-10-CM

## 2019-12-07 DIAGNOSIS — Z794 Long term (current) use of insulin: Secondary | ICD-10-CM | POA: Diagnosis not present

## 2019-12-07 DIAGNOSIS — I10 Essential (primary) hypertension: Secondary | ICD-10-CM | POA: Insufficient documentation

## 2019-12-07 DIAGNOSIS — Z79899 Other long term (current) drug therapy: Secondary | ICD-10-CM | POA: Insufficient documentation

## 2019-12-07 DIAGNOSIS — J441 Chronic obstructive pulmonary disease with (acute) exacerbation: Secondary | ICD-10-CM | POA: Diagnosis not present

## 2019-12-07 DIAGNOSIS — Z87891 Personal history of nicotine dependence: Secondary | ICD-10-CM | POA: Insufficient documentation

## 2019-12-07 DIAGNOSIS — Z7982 Long term (current) use of aspirin: Secondary | ICD-10-CM | POA: Diagnosis not present

## 2019-12-07 DIAGNOSIS — R739 Hyperglycemia, unspecified: Secondary | ICD-10-CM | POA: Diagnosis present

## 2019-12-07 LAB — URINALYSIS, COMPLETE (UACMP) WITH MICROSCOPIC
Bacteria, UA: NONE SEEN
Bilirubin Urine: NEGATIVE
Glucose, UA: >=500 mg/dL — AB
Hgb urine dipstick: NEGATIVE
Ketones, ur: NEGATIVE mg/dL
Leukocytes,Ua: NEGATIVE
Nitrite: NEGATIVE
Protein, ur: NEGATIVE mg/dL
Specific Gravity, Urine: 1.022 (ref 1.005–1.030)
Squamous Epithelial / HPF: NONE SEEN (ref 0–5)
pH: 6 (ref 5.0–8.0)

## 2019-12-07 LAB — CBC
HCT: 40.6 % (ref 39.0–52.0)
Hemoglobin: 13.3 g/dL (ref 13.0–17.0)
MCH: 27.5 pg (ref 26.0–34.0)
MCHC: 32.8 g/dL (ref 30.0–36.0)
MCV: 84.1 fL (ref 80.0–100.0)
Platelets: 219 10*3/uL (ref 150–400)
RBC: 4.83 MIL/uL (ref 4.22–5.81)
RDW: 15.8 % — ABNORMAL HIGH (ref 11.5–15.5)
WBC: 6 10*3/uL (ref 4.0–10.5)
nRBC: 0 % (ref 0.0–0.2)

## 2019-12-07 LAB — GLUCOSE, CAPILLARY: Glucose-Capillary: 527 mg/dL (ref 70–99)

## 2019-12-07 LAB — BASIC METABOLIC PANEL
Anion gap: 10 (ref 5–15)
BUN: 25 mg/dL — ABNORMAL HIGH (ref 6–20)
CO2: 24 mmol/L (ref 22–32)
Calcium: 8.9 mg/dL (ref 8.9–10.3)
Chloride: 96 mmol/L — ABNORMAL LOW (ref 98–111)
Creatinine, Ser: 1.51 mg/dL — ABNORMAL HIGH (ref 0.61–1.24)
GFR calc Af Amer: 59 mL/min — ABNORMAL LOW (ref >60)
GFR calc non Af Amer: 51 mL/min — ABNORMAL LOW (ref >60)
Glucose, Bld: 559 mg/dL (ref 70–99)
Potassium: 4.2 mmol/L (ref 3.5–5.1)
Sodium: 130 mmol/L — ABNORMAL LOW (ref 135–145)

## 2019-12-07 MED ORDER — LACTATED RINGERS IV BOLUS
1000.0000 mL | Freq: Once | INTRAVENOUS | Status: AC
  Administered 2019-12-08: 1000 mL via INTRAVENOUS

## 2019-12-07 MED ORDER — INSULIN ASPART 100 UNIT/ML ~~LOC~~ SOLN
10.0000 [IU] | Freq: Once | SUBCUTANEOUS | Status: DC
  Filled 2019-12-07: qty 1

## 2019-12-07 NOTE — ED Notes (Signed)
fsbs 527

## 2019-12-07 NOTE — ED Triage Notes (Signed)
Pt ambulatory to triage.  Pt reports elevated blood sugars today.  Hx copd   Pt started prednisone yesterday.  Pt has lower back pain .  States my kidneys are hurting   Pt alert  Speech clear.

## 2019-12-08 ENCOUNTER — Emergency Department: Payer: Medicaid Other

## 2019-12-08 LAB — GLUCOSE, CAPILLARY
Glucose-Capillary: 265 mg/dL — ABNORMAL HIGH (ref 70–99)
Glucose-Capillary: 369 mg/dL — ABNORMAL HIGH (ref 70–99)

## 2019-12-08 LAB — RESPIRATORY PANEL BY RT PCR (FLU A&B, COVID)
Influenza A by PCR: NEGATIVE
Influenza B by PCR: NEGATIVE
SARS Coronavirus 2 by RT PCR: NEGATIVE

## 2019-12-08 IMAGING — CR DG CHEST 2V
2 series · 2 of 2 positions shown · non-contrast
Comparison: [DATE]

CLINICAL DATA: Wheezing

EXAM:
CHEST - 2 VIEW

[chest pa]
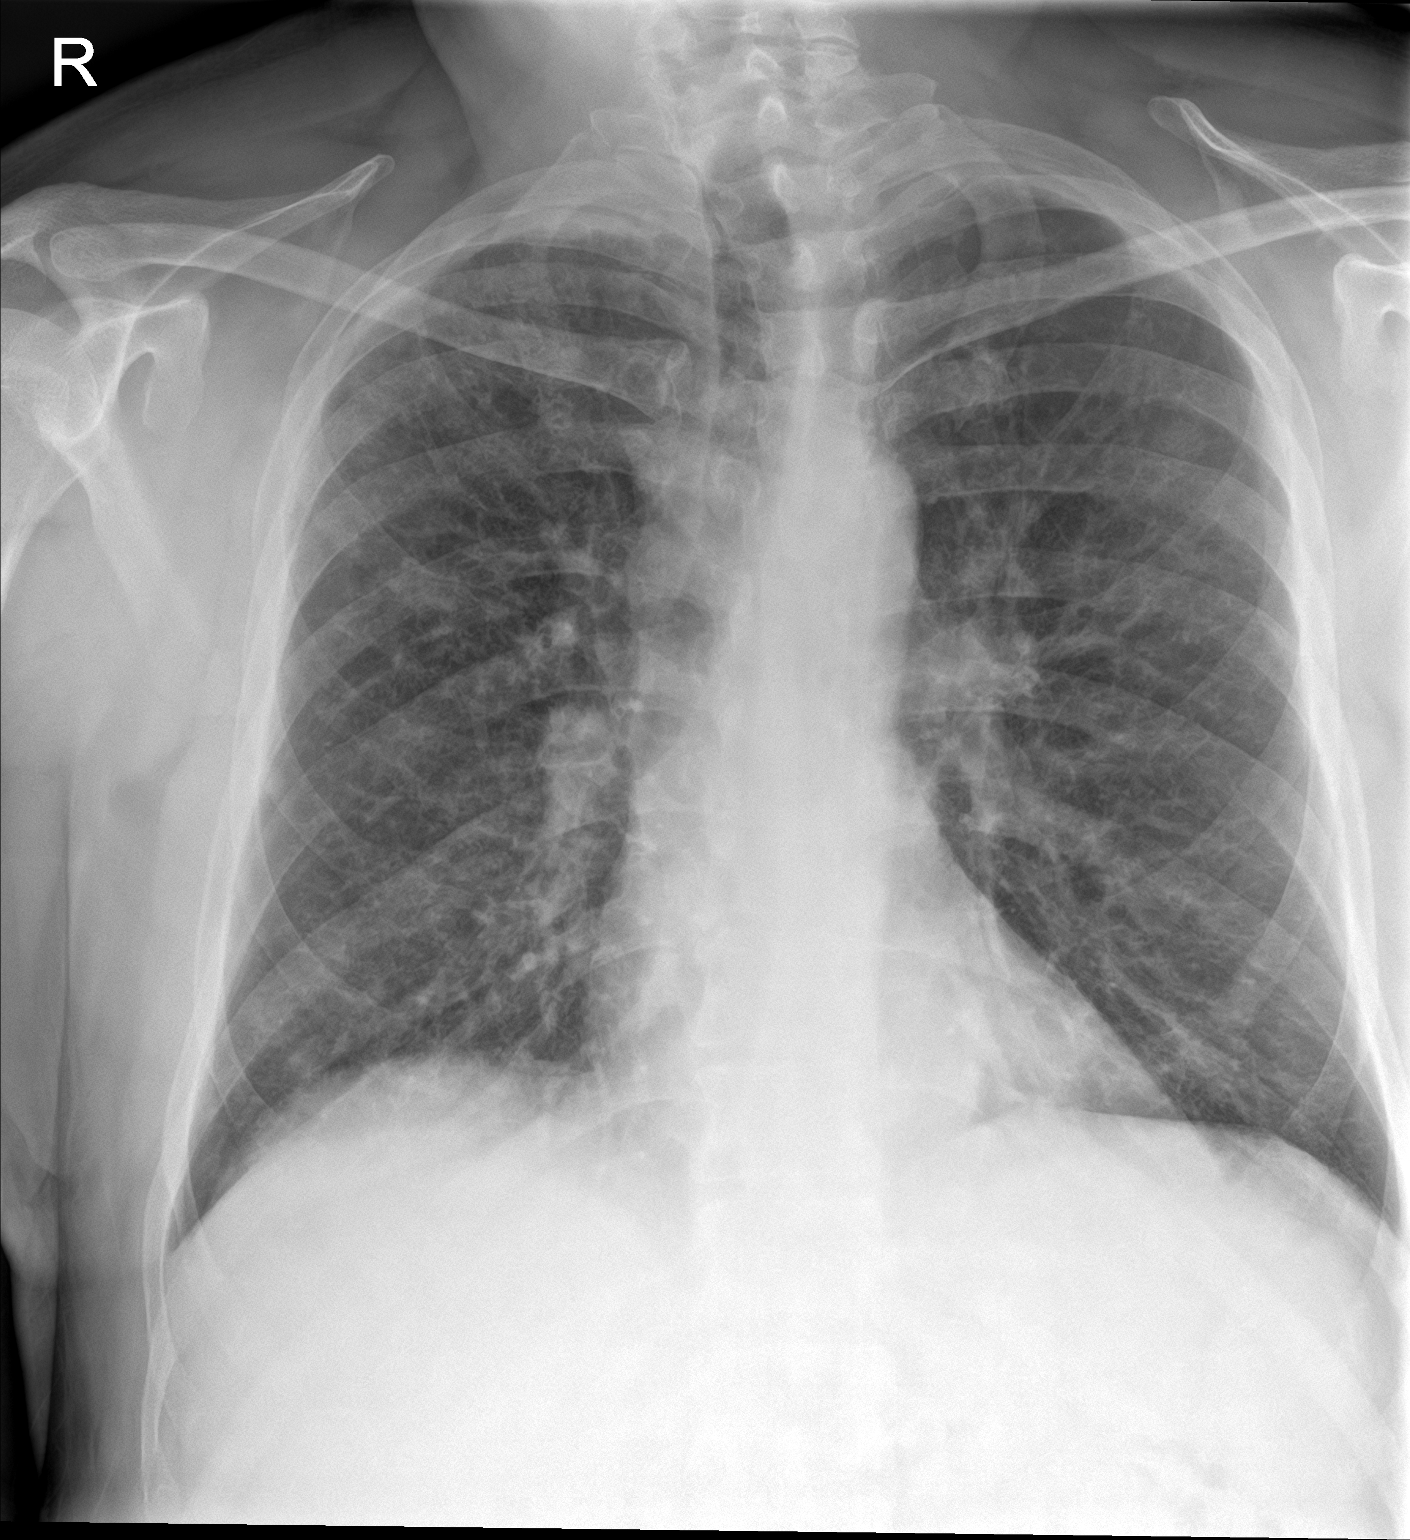

[chest lat]
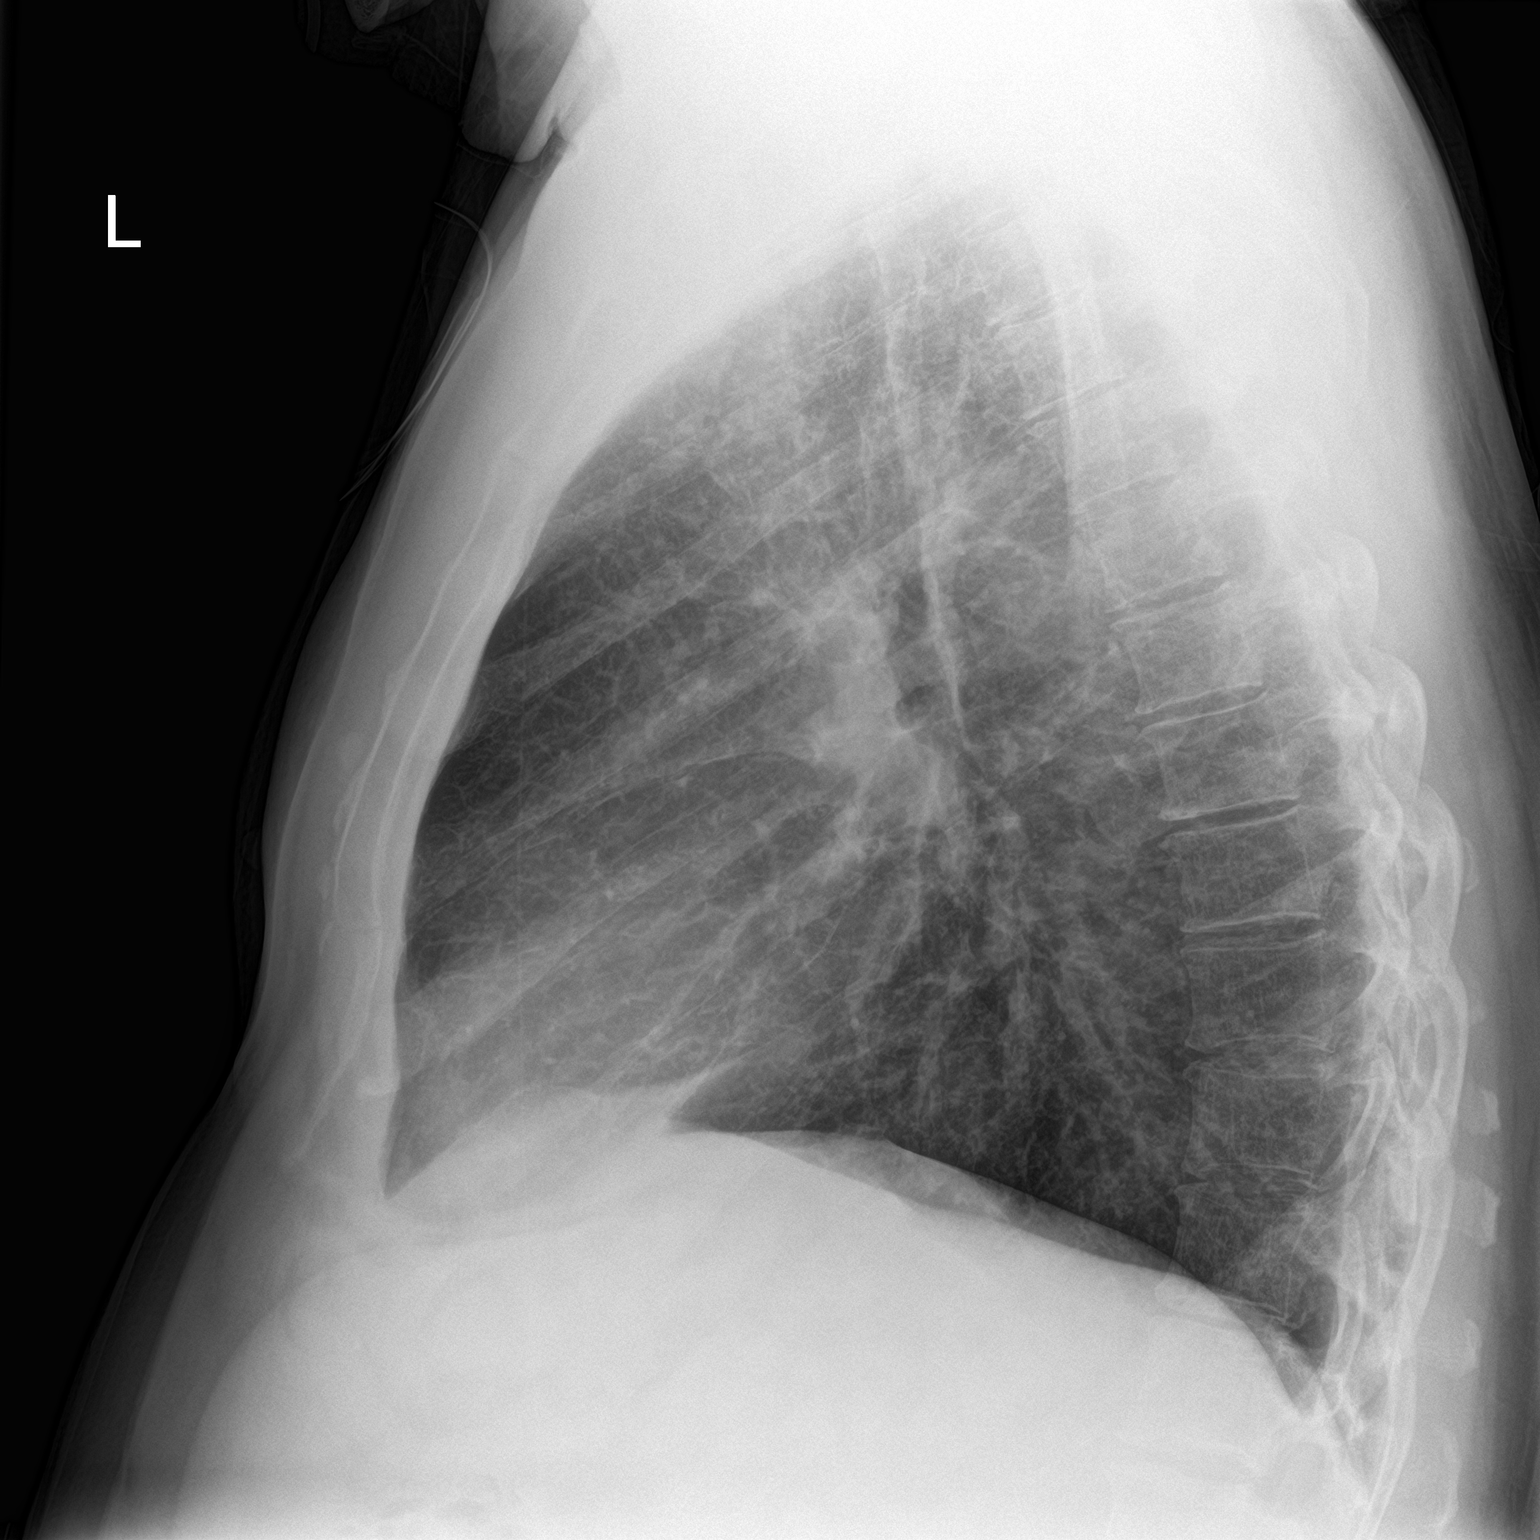

[2 of 2 positions shown; findings below may reference images not displayed]

FINDINGS: Cardiac shadow is within normal limits. Lungs are well aerated
bilaterally. No sizable effusion or pneumothorax is noted. Diffuse
increased interstitial changes are noted bilaterally stable from the
prior exam. No bony abnormality is noted.
IMPRESSION: Chronic interstitial changes bilaterally. These are stable from the
prior exam. No acute abnormality is noted.

## 2019-12-08 MED ORDER — INSULIN ASPART 100 UNIT/ML ~~LOC~~ SOLN
5.0000 [IU] | Freq: Once | SUBCUTANEOUS | Status: AC
  Administered 2019-12-08: 5 [IU] via INTRAVENOUS

## 2019-12-08 MED ORDER — INSULIN ASPART 100 UNIT/ML ~~LOC~~ SOLN
SUBCUTANEOUS | Status: AC
  Filled 2019-12-08: qty 1

## 2019-12-08 MED ORDER — INSULIN LISPRO 100 UNIT/ML ~~LOC~~ SOLN
SUBCUTANEOUS | 3 refills | Status: DC
Start: 2019-12-08 — End: 2020-10-15

## 2019-12-08 NOTE — Discharge Instructions (Signed)
Continue 70/30 insulin at home as prescribed. While you are on prednisone you may need to give yourself more insulin during her meals. This insulin should be a short acting insulin. I have sent a prescription for Humalog. Please follow the below instructions for administration of this insulin.  Check your blood glucose (BG) 3 times daily with meals and administer the following amount subcutaneously per scale below:  BG < 120: 0 units  BG 121 - 150: 3 units  BG 151 - 200: 4 units  BG 201 - 250: 7 units  BG 251 - 300: 11 units  BG 301 - 350: 15 units  BG 351 - 400: 20 units  BG > 400: call your doctor or go to the ER,  Follow-up with your doctor in 2 days. Return to the emergency room for worsening shortness of breath, chest pain, sugars greater than 400.

## 2019-12-08 NOTE — ED Provider Notes (Signed)
[facility] Emergency Department Provider Note  ____________________________________________  Time seen: Approximately 1:41 AM  I have reviewed the triage vital signs and the nursing notes.   HISTORY  Chief Complaint Hyperglycemia   HPI Darren Allen is a 54 y.o. male with a history of diabetes  who presents for evaluation of hyperglycemia. Patient saw his pulmonologist 2 days ago for a COPD exacerbation and was started on prednisone. He took 60 mg of prednisone the last 2 days. Today his sugar was very elevated which prompted his visit to the emergency room. Patient reports that for the last week he has been having worsening wheezing and shortness of breath. Has been using his inhalers. No fever, no hemoptysis, no sore throat, no loss of taste or smell, no known exposures to Covid. Patient is fully vaccinated. He takes 70/30 insulin twice a day. He does not take short acting insulin. He used to be on it but was discontinued by his primary care doctor.  Past Medical History:  Diagnosis Date  . 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 COPD (chronic obstructive pulmonary disease) ([place])   . Diabetes mellitus without complication ([place])    type 2  . Hypertension     Patient Active Problem List   Diagnosis Date Noted  . Ankle fracture 03/23/2017  . Type 2 diabetes mellitus ([place]) 02/04/2015  . 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 Perianal abscess 02/04/2015  . Perirectal abscess   . Chronic obstructive pulmonary disease ([place]) 01/17/2013  . Acid reflux 01/17/2013  . HLD (hyperlipidemia) 01/17/2013  . Apnea, sleep 01/17/2013  . Testicular hypofunction 01/17/2013  . Allergic state 01/17/2013  . Adiposity 07/06/2011    Past Surgical History:  Procedure Laterality Date  . APPENDECTOMY    . INCISION AND DRAINAGE PERIRECTAL ABSCESS N/A 02/04/2015   Procedure: IRRIGATION AND DEBRIDEMENT PERIRECTAL ABSCESS;  Surgeon: Marlyce Huge,  MD;  Location: [facility] ORS;  Service: General;  Laterality: N/A;  . ORIF ANKLE FRACTURE Left 03/23/2017   Procedure: OPEN REDUCTION INTERNAL FIXATION (ORIF) ANKLE FRACTURE;  Surgeon: Leim Fabry, MD;  Location: [facility] ORS;  Service: Orthopedics;  Laterality: Left;  . RECTAL EXAM UNDER ANESTHESIA  02/04/2015   Procedure: RECTAL EXAM UNDER ANESTHESIA;  Surgeon: Marlyce Huge, MD;  Location: [facility] ORS;  Service: General;;  . SYNDESMOSIS REPAIR Left 03/23/2017   Procedure: SYNDESMOSIS REPAIR;  Surgeon: Leim Fabry, MD;  Location: [facility] ORS;  Service: Orthopedics;  Laterality: Left;    Prior to Admission medications   Medication Sig Start Date End Date Taking? Authorizing Provider  acetaminophen (TYLENOL) 500 MG tablet Take 2 tablets (1,000 mg total) by mouth every 8 (eight) hours. 03/24/17   Reche Dixon, PA-C  amoxicillin-clavulanate (AUGMENTIN) 875-125 MG tablet Take 1 tablet by mouth every 12 (twelve) hours. Patient not taking: Reported on 03/23/2017 02/06/15   Marlyce Huge, MD  aspirin EC 325 MG EC tablet Take 1 tablet (325 mg total) by mouth daily. 03/24/17   Reche Dixon, PA-C  azithromycin (ZITHROMAX) 250 MG tablet TK UTD 03/18/17   [provider]  budesonide-formoterol (SYMBICORT) 80-4.5 MCG/ACT inhaler Inhale 2 puffs into the lungs 2 (two) times daily.    [provider]  fluconazole (DIFLUCAN) 200 MG tablet Take 1 tablet (200 mg total) by mouth daily. Patient not taking: Reported on 03/23/2017 02/15/15   Hubbard Robinson, MD  hydrochlorothiazide (HYDRODIURIL) 25 MG tablet Take 1 tablet by mouth daily. 02/26/17   [provider]  insulin lispro (  HUMALOG) 100 UNIT/ML injection Check your blood glucose (BG) 3 times daily with meals and administer the following amount subcutaneously per scale below: BG < 120: 0 units BG 121 - 150: 3 units BG 151 - 200: 4 units BG 201 - 250: 7 units BG 251 - 300: 11 units BG 301 - 350: 15 units BG 351 - 400: 20 units BG > 400:  call your doctor or go to the ER 12/08/19 12/07/20  Alfred Levins, [place], MD  JANUVIA 50 MG tablet Take 1 tablet by mouth daily. 02/26/17   [provider]  lisinopril (PRINIVIL,ZESTRIL) 10 MG tablet Take 10 mg by mouth daily.    [provider]  lisinopril (PRINIVIL,ZESTRIL) 30 MG tablet Take 1 tablet (30 mg total) by mouth daily. Patient not taking: Reported on 03/23/2017 05/05/16 05/05/17  Gregor Hams, MD  lisinopril (PRINIVIL,ZESTRIL) 40 MG tablet Take 1 tablet by mouth daily. 02/26/17   [provider]  metFORMIN (GLUCOPHAGE-XR) 500 MG 24 hr tablet Take 4 tablets by mouth daily. 02/26/17   [provider]  nicotine (NICODERM CQ - DOSED IN MG/24 HOURS) 21 mg/24hr patch Place 1 patch onto the skin daily. 06/01/14   [provider]  omeprazole (PRILOSEC OTC) 20 MG tablet Take 20 mg by mouth 2 (two) times daily.    [provider]  ondansetron (ZOFRAN) 4 MG tablet Take 1 tablet (4 mg total) by mouth every 6 (six) hours as needed for nausea. 03/24/17   Reche Dixon, PA-C  oxyCODONE (OXY IR/ROXICODONE) 5 MG immediate release tablet Take 1-2 tablets (5-10 mg total) by mouth every 4 (four) hours as needed for breakthrough pain. 03/24/17   Reche Dixon, PA-C  pantoprazole (PROTONIX) 40 MG tablet Take 2 tablets by mouth daily. 03/03/17   [provider]  predniSONE (DELTASONE) 20 MG tablet Take 3 tablets (60 mg total) by mouth daily with breakfast. Patient not taking: Reported on 03/23/2017 02/11/15   Orbie Pyo, MD  predniSONE (STERAPRED UNI-PAK 48 TAB) 10 MG (48) TBPK tablet TK PO UTD 03/17/17   [provider]  Testosterone Cypionate 200 MG/ML KIT Inject 200 mg into the muscle every 14 (fourteen) days.    [provider]  tiotropium (SPIRIVA) 18 MCG inhalation capsule Place 18 mcg into inhaler and inhale daily.    [provider]  VENTOLIN HFA 108 (90 Base) MCG/ACT inhaler Inhale 2 puffs into the lungs every 6  (six) hours as needed. 03/22/17   [provider]    Allergies Glipizide  Family History  Problem Relation Age of Onset  . Cancer Mother 12       Lung  . Cancer Father        Colon  . Heart disease Father   . Alcohol abuse Father   . Cancer Brother 58       Esophageal  . Diabetes Brother   . Heart disease Brother     Social History Social History   Tobacco Use  . Smoking status: Former Smoker    Packs/day: 0.50    Years: 34.00    Pack years: 17.00    Types: Cigarettes  . Smokeless tobacco: Never Used  Substance Use Topics  . Alcohol use: Yes    Alcohol/week: 12.0 standard drinks    Types: 12 Cans of beer per week    Comment: varies- only drinks on weekend  . Drug use: No    Review of Systems  Constitutional: Negative for fever. Eyes: Negative for visual  changes. ENT: Negative for sore throat. Neck: No neck pain  Cardiovascular: Negative for chest pain. Respiratory: + shortness of breath, wheezing Gastrointestinal: Negative for abdominal pain, vomiting or diarrhea. Genitourinary: Negative for dysuria. Musculoskeletal: Negative for back pain. Skin: Negative for rash. Neurological: Negative for headaches, weakness or numbness. Psych: No SI or HI  ____________________________________________   PHYSICAL EXAM:  VITAL SIGNS: Vitals:   12/08/19 0127 12/08/19 0239  BP: (!) 154/91   Pulse: 79 77  Resp: 20 18  Temp:  97.9 F (36.6 C)  SpO2: 100% 99%    Constitutional: Alert and oriented. Well appearing and in no apparent distress. HEENT:      Head: Normocephalic and atraumatic.         Eyes: Conjunctivae are normal. Sclera is non-icteric.       Mouth/Throat: Mucous membranes are moist.       Neck: Supple with no signs of meningismus. Cardiovascular: Regular rate and rhythm. No murmurs, gallops, or rubs. 2+ symmetrical distal pulses are present in all extremities. No JVD. Respiratory: Normal respiratory effort. Good air movement with wheezing  bilaterally, normal sats at rest and with ambulation. Gastrointestinal: Soft, non tender. Musculoskeletal:  No edema, cyanosis, or erythema of extremities. Neurologic: Normal speech and language. Face is symmetric. Moving all extremities. No gross focal neurologic deficits are appreciated. Skin: Skin is warm, dry and intact. No rash noted. Psychiatric: Mood and affect are normal. Speech and behavior are normal.  ____________________________________________   LABS (all labs ordered are listed, but only abnormal results are displayed)  Labs Reviewed  BASIC METABOLIC PANEL - Abnormal; Notable for the following components:      Result Value   Sodium 130 (*)    Chloride 96 (*)    Glucose, Bld 559 (*)    BUN 25 (*)    Creatinine, Ser 1.51 (*)    GFR calc non Af Amer 51 (*)    GFR calc Af Amer 59 (*)    All other components within normal limits  CBC - Abnormal; Notable for the following components:   RDW 15.8 (*)    All other components within normal limits  URINALYSIS, COMPLETE (UACMP) WITH MICROSCOPIC - Abnormal; Notable for the following components:   Color, Urine STRAW (*)    APPearance CLEAR (*)    Glucose, UA >=500 (*)    All other components within normal limits  GLUCOSE, CAPILLARY - Abnormal; Notable for the following components:   Glucose-Capillary 527 (*)    All other components within normal limits  GLUCOSE, CAPILLARY - Abnormal; Notable for the following components:   Glucose-Capillary 369 (*)    All other components within normal limits  GLUCOSE, CAPILLARY - Abnormal; Notable for the following components:   Glucose-Capillary 265 (*)    All other components within normal limits  RESPIRATORY PANEL BY RT PCR (FLU A&B, COVID)  CBG MONITORING, ED  CBG MONITORING, ED   ____________________________________________  EKG  none  ____________________________________________  RADIOLOGY  I have personally reviewed the images performed during this visit and I agree with  the Radiologist's read.   Interpretation by Radiologist:  DG Chest 2 View  Result Date: 12/08/2019 CLINICAL DATA:  Wheezing EXAM: CHEST - 2 VIEW COMPARISON:  03/07/2019 FINDINGS: Cardiac shadow is within normal limits. Lungs are well aerated bilaterally. No sizable effusion or pneumothorax is noted. Diffuse increased interstitial changes are noted bilaterally stable from the prior exam. No bony abnormality is noted. IMPRESSION: Chronic interstitial changes bilaterally. These are stable from the  prior exam. No acute abnormality is noted. Electronically Signed   By: Inez Catalina M.D.   On: 12/08/2019 01:26     ____________________________________________   PROCEDURES  Procedure(s) performed: None Procedures Critical Care performed:  None ____________________________________________   INITIAL IMPRESSION / ASSESSMENT AND PLAN / ED COURSE  54 y.o. male with a history of diabetes  who presents for evaluation of hyperglycemia in the setting of being placed on prednisone by his pulmonologist 2 days ago for COPD exacerbation. Patient has normal work of breathing and normal sats. He is not in any respiratory distress. CXR showing no signs of pneumonia, visualized by me, confirmed by radiology.  Will swab patient for Covid.  Here he is hyperglycemic with no evidence of DKA with a blood glucose of 559, normal bicarb, no anion gap.  This is due to patient being on prednisone which I do believe he needs to be for his COPD exacerbation.  Therefore I recommended the patient continue with his 70/30 insulin and I will add a sliding scale of Humalog for patient to take while on prednisone.  Patient has been on short acting insulin in the past and agrees with the plan.  Patient was given a liter of LR and his blood glucose improved to 265.  Recommended close follow-up with his PCP and his pulmonologist.  Old medical records reviewed.      _____________________________________________ Please note:  Patient  was evaluated in Emergency Department today for the symptoms described in the history of present illness. Patient was evaluated in the context of the global COVID-19 pandemic, which necessitated consideration that the patient might be at risk for infection with the SARS-CoV-2 virus that causes COVID-19. Institutional protocols and algorithms that pertain to the evaluation of patients at risk for COVID-19 are in a state of rapid change based on information released by regulatory bodies including the CDC and federal and state organizations. These policies and algorithms were followed during the patient's care in the ED.  Some ED evaluations and interventions may be delayed as a result of limited staffing during the pandemic.   [place] Controlled Substance Database was reviewed by me. ____________________________________________   FINAL CLINICAL IMPRESSION(S) / ED DIAGNOSES   Final diagnoses:  Steroid-induced hyperglycemia      NEW MEDICATIONS STARTED DURING THIS VISIT:  ED Discharge Orders         Ordered    insulin lispro (HUMALOG) 100 UNIT/ML injection        12/08/19 0145           Note:  This document was prepared using Dragon voice recognition software and may include unintentional dictation errors.    Alfred Levins, [place], MD 12/08/19 [id number]

## 2019-12-30 DIAGNOSIS — E1142 Type 2 diabetes mellitus with diabetic polyneuropathy: Secondary | ICD-10-CM | POA: Diagnosis present

## 2020-01-26 ENCOUNTER — Ambulatory Visit (INDEPENDENT_AMBULATORY_CARE_PROVIDER_SITE_OTHER): Payer: Medicaid Other

## 2020-01-26 ENCOUNTER — Ambulatory Visit
Admission: EM | Admit: 2020-01-26 | Discharge: 2020-01-26 | Disposition: A | Discharge status: Home / Self Care | Payer: Medicaid Other

## 2020-01-26 ENCOUNTER — Other Ambulatory Visit: Payer: Self-pay

## 2020-01-26 ENCOUNTER — Encounter: Payer: Self-pay | Admitting: Emergency Medicine

## 2020-01-26 DIAGNOSIS — S39012A Strain of muscle, fascia and tendon of lower back, initial encounter: Secondary | ICD-10-CM | POA: Diagnosis not present

## 2020-01-26 DIAGNOSIS — M545 Low back pain, unspecified: Secondary | ICD-10-CM

## 2020-01-26 HISTORY — DX: Polyneuropathy, unspecified: G62.9

## 2020-01-26 IMAGING — CR DG LUMBAR SPINE COMPLETE 4+V
5 series · 5 of 5 positions shown · non-contrast
Comparison: None.

CLINICAL DATA: Pain following fall

EXAM:
LUMBAR SPINE - COMPLETE 4+ VIEW

[l-spine ap]
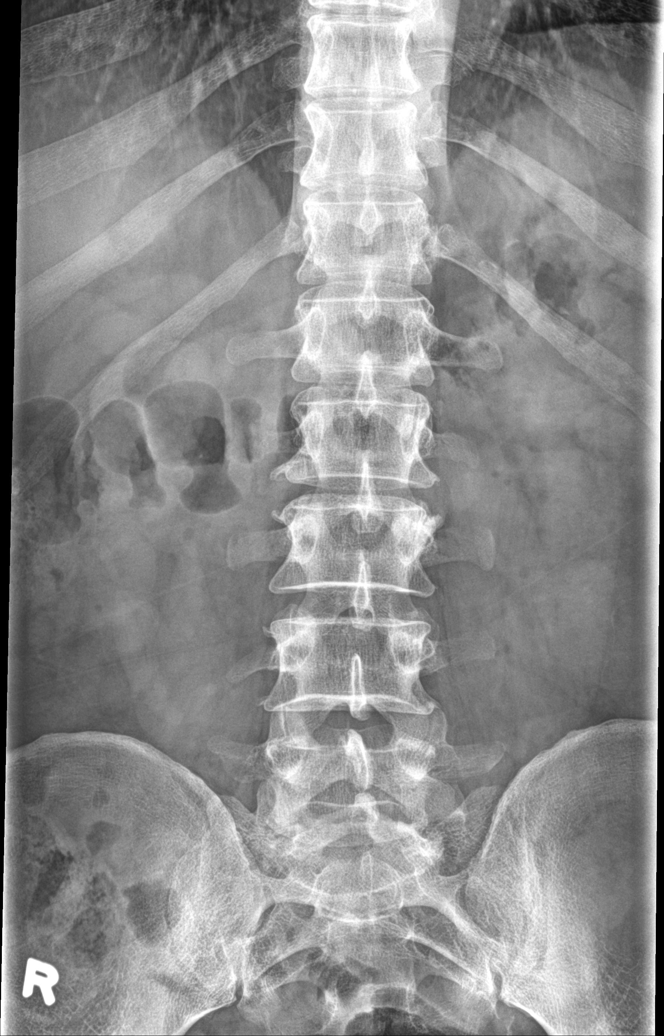

[l-spine obl (1 of 2)]
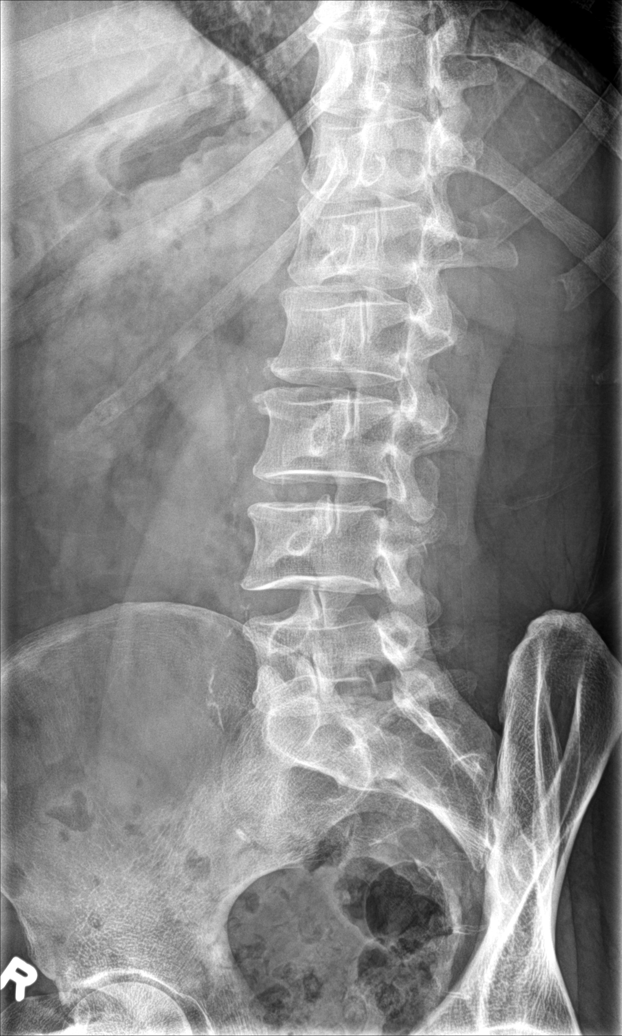

[l-spine obl (2 of 2)]
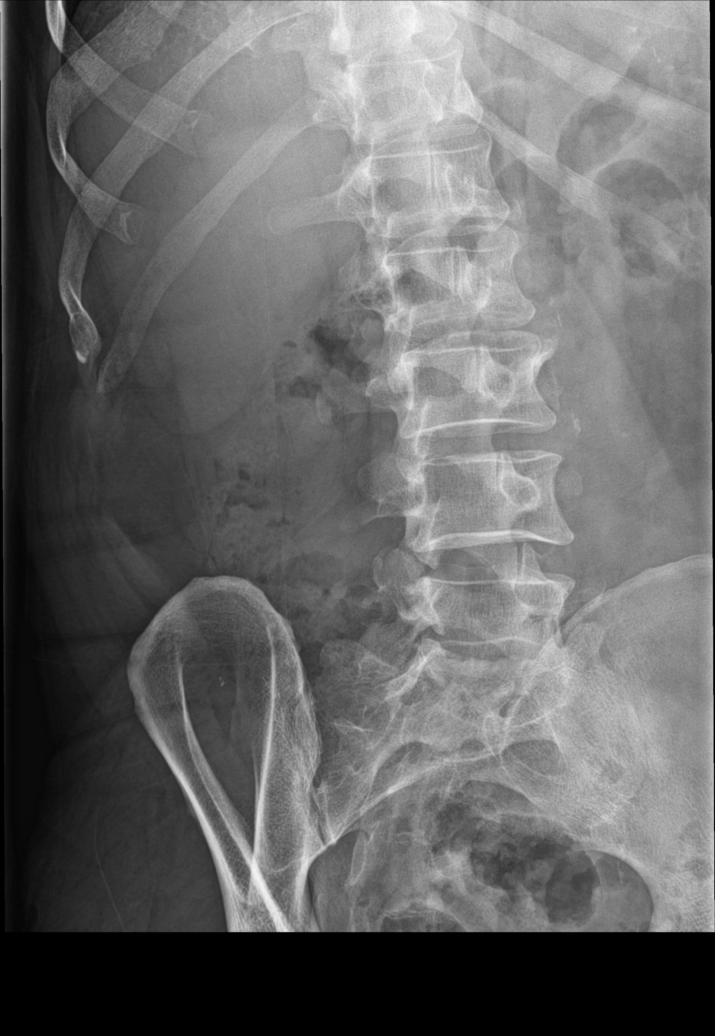

[l-spine lat]
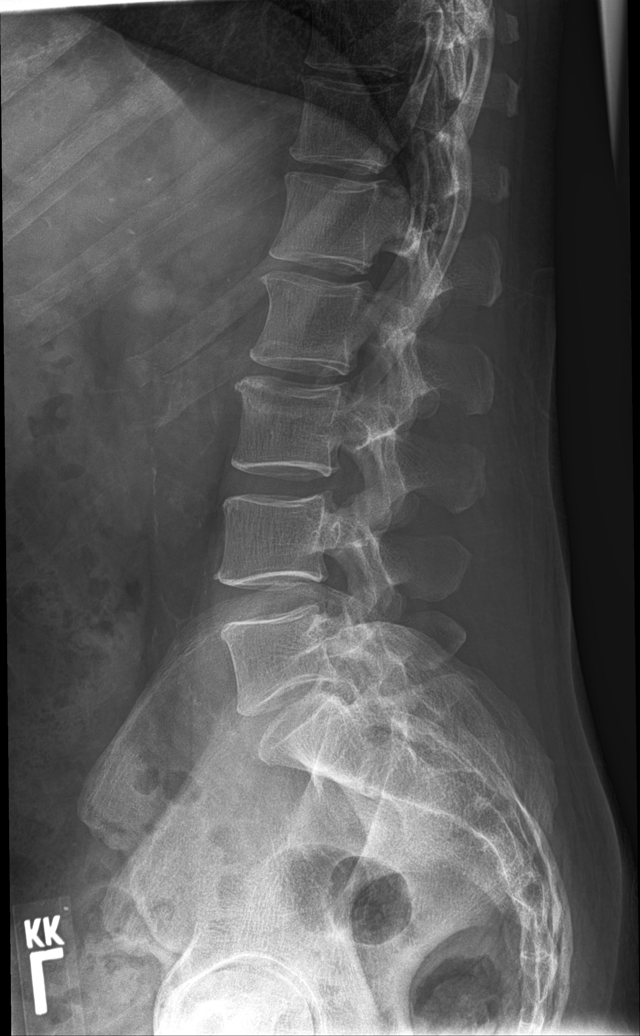

[l-spine spot]
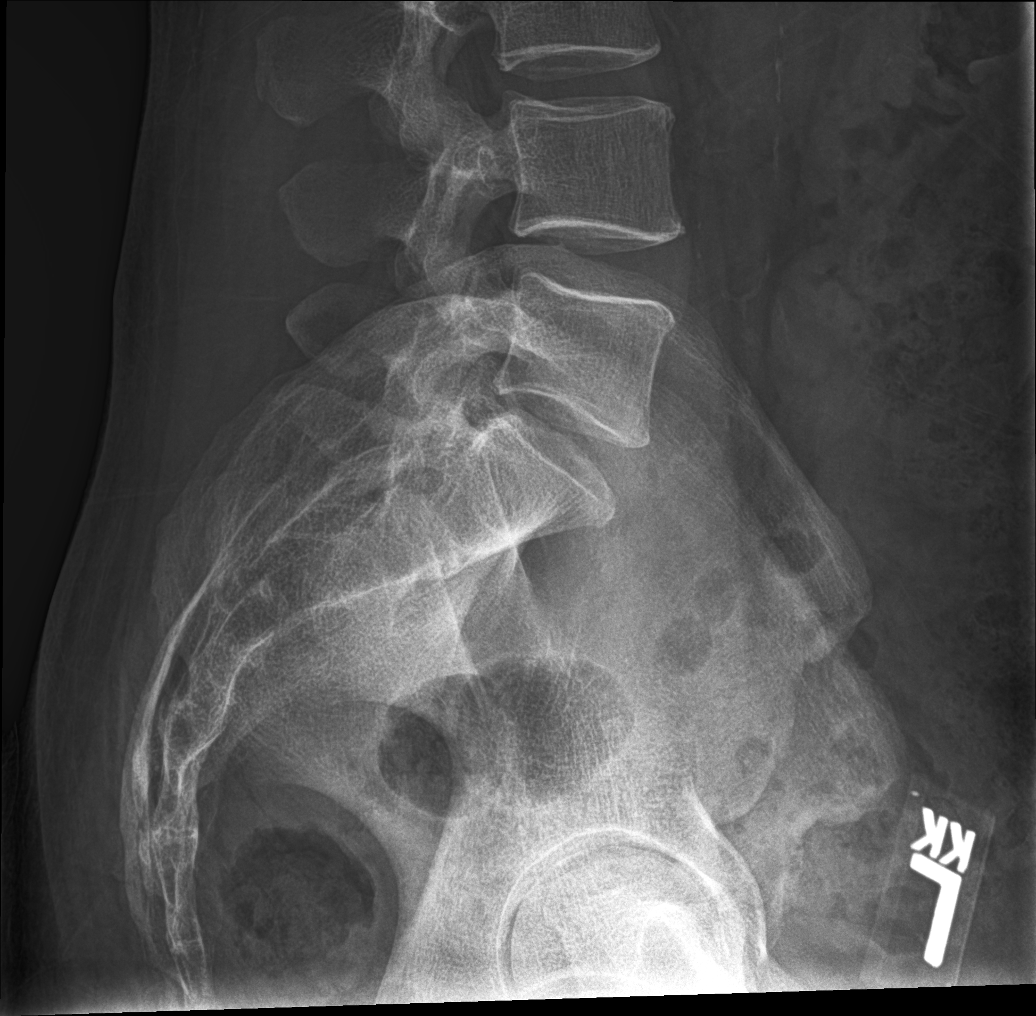

[5 of 5 positions shown; findings below may reference images not displayed]

FINDINGS: Frontal, lateral, spot lumbosacral lateral, and bilateral oblique
views were obtained. There are 5 non-rib-bearing lumbar type
vertebral bodies. There is no fracture or spondylolisthesis. Disc
spaces appear unremarkable. There is no appreciable facet
arthropathy. There are foci of aortic atherosclerosis.
IMPRESSION: No fracture or spondylolisthesis. No appreciable arthropathic
change.

Aortic Atherosclerosis ([1Z]-[1Z]).

## 2020-01-26 MED ORDER — HYDROCODONE-ACETAMINOPHEN 5-325 MG PO TABS
1.0000 | ORAL_TABLET | Freq: Four times a day (QID) | ORAL | 0 refills | Status: DC | PRN
Start: 2020-01-26 — End: 2020-01-29

## 2020-01-26 MED ORDER — BACLOFEN 10 MG PO TABS: 10.0000 mg | ORAL_TABLET | Freq: Three times a day (TID) | ORAL | 0 refills | Status: AC | PRN

## 2020-01-26 NOTE — Discharge Instructions (Addendum)
BACK PAIN: Stressed avoiding painful activities . RICE (REST, ICE, COMPRESSION, ELEVATION) guidelines reviewed. May alternate ice and heat. Consider use of muscle rubs, Salonpas patches, etc. Use medications as directed including muscle relaxers if prescribed. T  F/u with PCP in 7-10 days for reexamination, and please feel free to call or return to the urgent care at any time for any questions or concerns you may have and we will be happy to help you!   BACK PAIN RED FLAGS: If the back pain acutely worsens or there are any red flag symptoms such as numbness/tingling, leg weakness, saddle anesthesia, or loss of bowel/bladder control, go immediately to the ER. Follow up with [place] as scheduled or sooner if the pain does not begin to resolve or if it worsens before the follow up

## 2020-01-26 NOTE — ED Triage Notes (Signed)
Patient in today c/o back pain after falling on 01/25/20. Patient states he has neuropathy in his feet and ankles and sometimes looses his balance. Patient states that is what happened yesterday.

## 2020-01-26 NOTE — ED Provider Notes (Signed)
[facility]    CSN: [id number] Arrival date & time: 01/26/20  0800      History   Chief Complaint Chief Complaint  Patient presents with  . Fall    DOI 01/25/20  . Back Pain    HPI Darren Allen is a 54 y.o. male   presenting for lower back pain following a fall yesterday.  He states that he has bad neuropathy in his feet and ankles and suddenly felt and "electric shock" that caused him to fall flat on his back.  He admits to moderate to severe lower back pain bilaterally that does not radiate to extremities.  Pain is reportedly 7 out of 10.  Pain worse with looking forward.  Pain increased with sitting.  Patient states he could not get any rest and has been up since fairly this morning due to the pain.  Has taken Tylenol without much relief.  Patient currently takes gabapentin for peripheral neuropathy due to uncontrolled diabetes.  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  Patient states that he did not his head and he did not pass out.  He denies any other injuries.  He denies any saddle anesthesia, leg weakness, or loss of bowel or bladder control.  He states that he already had some chronic back pain prior to the injury but now it is much worse.  Patient's PCP is Rolin Barry, MD. he has no other complaints or concerns today. HPI  Past Medical History:  Diagnosis Date  . 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 COPD (chronic obstructive pulmonary disease) (HCC)   . Diabetes mellitus without complication (HCC)    type 2  . Hypertension   . Neuropathy     Patient Active Problem List   Diagnosis Date Noted  . Ankle fracture 03/23/2017  . Type 2 diabetes mellitus (HCC) 02/04/2015  . 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 Perianal abscess 02/04/2015  . Perirectal abscess   . Chronic obstructive pulmonary disease  (HCC) 01/17/2013  . Acid reflux 01/17/2013  . HLD (hyperlipidemia) 01/17/2013  . Apnea, sleep 01/17/2013  . Testicular hypofunction 01/17/2013  . Allergic state 01/17/2013  . Adiposity 07/06/2011    Past Surgical History:  Procedure Laterality Date  . APPENDECTOMY    . INCISION AND DRAINAGE PERIRECTAL ABSCESS N/A 02/04/2015   Procedure: IRRIGATION AND DEBRIDEMENT PERIRECTAL ABSCESS;  Surgeon: Ida Rogue, MD;  Location: [facility] OR[facility];  Service: General;  Laterality: N/A;  . ORIF ANKLE FRACTURE Left 03/23/2017   Procedure: OPEN REDUCTION INTERNAL FIXATION (ORIF) ANKLE FRACTURE;  Surgeon: Signa Kell, MD;  Location: [facility];  Service: Orthopedics;  Laterality: Left;  . RECTAL EXAM UNDER ANESTHESIA  02/04/2015   Procedure: RECTAL EXAM UNDER ANESTHESIA;  Surgeon: Ida Rogue, MD;  Location: [facility];  Service: General;;  . SYNDESMOSIS REPAIR Left 03/23/2017   Procedure: SYNDESMOSIS REPAIR;  Surgeon: Signa Kell, MD;  Location: [facility];  Service: Orthopedics;  Laterality: Left;       Home Medications    Prior to Admission medications   Medication Sig Start Date End Date Taking? Authorizing Provider  acetaminophen (TYLENOL) 500 MG tablet Take 2 tablets (1,000 mg total) by mouth every 8 (eight) hours. 03/24/17  Yes Dedra Skeens, PA-C  aspirin EC 325 MG EC tablet Take 1 tablet (325 mg total) by mouth daily. 03/24/17  Yes Dedra Skeens, PA-C  atorvastatin (LIPITOR)  40 MG tablet Take 40 mg by mouth at bedtime. 09/14/19  Yes [provider]  gabapentin (NEURONTIN) 300 MG capsule Take 300 mg by mouth 3 (three) times daily. 01/07/20  Yes [provider]  hydrochlorothiazide (HYDRODIURIL) 25 MG tablet Take 1 tablet by mouth daily. 02/26/17  Yes [provider]  hydrOXYzine (ATARAX/VISTARIL) 25 MG tablet Take 1 tablet by mouth 3 (three) times daily as needed for anxiety. 01/08/20  Yes [provider]  insulin isophane & regular human (HUMULIN 70/30  KWIKPEN) (70-30) 100 UNIT/ML KwikPen Inject 40 Units into the skin 2 (two) times daily with a meal. 10/14/19 10/13/20 Yes [provider]  lisinopril (PRINIVIL,ZESTRIL) 40 MG tablet Take 1 tablet by mouth daily. 02/26/17  Yes [provider]  metFORMIN (GLUCOPHAGE-XR) 500 MG 24 hr tablet Take 4 tablets by mouth daily. 02/26/17  Yes [provider]  nicotine (NICODERM CQ - DOSED IN MG/24 HOURS) 21 mg/24hr patch Place 1 patch onto the skin daily. 06/01/14  Yes [provider]  pantoprazole (PROTONIX) 40 MG tablet Take 2 tablets by mouth daily. 03/03/17  Yes [provider]  Testosterone Cypionate 200 MG/ML KIT Inject 200 mg into the muscle every 14 (fourteen) days.   Yes [provider]  VENTOLIN HFA 108 (90 Base) MCG/ACT inhaler Inhale 2 puffs into the lungs every 6 (six) hours as needed. 03/22/17  Yes [provider]  amoxicillin-clavulanate (AUGMENTIN) 875-125 MG tablet Take 1 tablet by mouth every 12 (twelve) hours. Patient not taking: Reported on 03/23/2017 02/06/15   Marlyce Huge, MD  azithromycin [facility]) 250 MG tablet TK UTD 03/18/17   [provider]  baclofen (LIORESAL) 10 MG tablet Take 1 tablet (10 mg total) by mouth 3 (three) times daily as needed for up to 10 days for muscle spasms. 01/26/20 02/05/20  Laurene Footman B, PA-C  budesonide-formoterol (SYMBICORT) 80-4.5 MCG/ACT inhaler Inhale 2 puffs into the lungs 2 (two) times daily.    [provider]  fluconazole (DIFLUCAN) 200 MG tablet Take 1 tablet (200 mg total) by mouth daily. Patient not taking: Reported on 03/23/2017 02/15/15   Hubbard Robinson, MD  HYDROcodone-acetaminophen (NORCO/VICODIN) 5-325 MG tablet Take 1 tablet by mouth every 6 (six) hours as needed for up to 3 days for severe pain. 01/26/20 01/29/20  Laurene Footman B, PA-C  insulin lispro (HUMALOG) 100 UNIT/ML injection Check your blood glucose (BG) 3 times daily with meals and administer the  following amount subcutaneously per scale below: BG < 120: 0 units BG 121 - 150: 3 units BG 151 - 200: 4 units BG 201 - 250: 7 units BG 251 - 300: 11 units BG 301 - 350: 15 units BG 351 - 400: 20 units BG > 400: call your doctor or go to the ER 12/08/19 12/07/20  Alfred Levins, [place], MD  JANUVIA 50 MG tablet Take 1 tablet by mouth daily. 02/26/17   [provider]  lisinopril (PRINIVIL,ZESTRIL) 10 MG tablet Take 10 mg by mouth daily.    [provider]  lisinopril (PRINIVIL,ZESTRIL) 30 MG tablet Take 1 tablet (30 mg total) by mouth daily. Patient not taking: Reported on 03/23/2017 05/05/16 05/05/17  Gregor Hams, MD  omeprazole (PRILOSEC OTC) 20 MG tablet Take 20 mg by mouth 2 (two) times daily.    [provider]  ondansetron (ZOFRAN) 4 MG tablet Take 1 tablet (4 mg total) by mouth every 6 (six) hours as needed for nausea. 03/24/17   Reche Dixon, PA-C  oxyCODONE (  OXY IR/ROXICODONE) 5 MG immediate release tablet Take 1-2 tablets (5-10 mg total) by mouth every 4 (four) hours as needed for breakthrough pain. 03/24/17   Reche Dixon, PA-C  predniSONE (DELTASONE) 20 MG tablet Take 3 tablets (60 mg total) by mouth daily with breakfast. Patient not taking: Reported on 03/23/2017 02/11/15   Orbie Pyo, MD  predniSONE (STERAPRED UNI-PAK 48 TAB) 10 MG (48) TBPK tablet TK PO UTD 03/17/17   [provider]  tiotropium (SPIRIVA) 18 MCG inhalation capsule Place 18 mcg into inhaler and inhale daily.    [provider]  TRELEGY ELLIPTA 100-62.5-25 MCG/INH AEPB Inhale 1 puff into the lungs daily. 12/29/19   [provider]    Family History Family History  Problem Relation Age of Onset  . Cancer Mother 70       Lung  . Cancer Father        Colon  . Heart disease Father   . Alcohol abuse Father   . Cancer Brother 26       Esophageal  . Diabetes Brother   . Heart disease Brother     Social History Social History   Tobacco Use  . Smoking  status: Former Smoker    Packs/day: 0.50    Years: 34.00    Pack years: 17.00    Types: Cigarettes    Quit date: 06/15/2019    Years since quitting: 0.6  . Smokeless tobacco: Never Used  . Tobacco comment: patient using Nicoderm patches  Vaping Use  . Vaping Use: Never used  Substance Use Topics  . Alcohol use: Yes    Alcohol/week: 12.0 standard drinks    Types: 12 Cans of beer per week    Comment: varies- only drinks on weekend. 01/26/20 3 beers per weekend  . Drug use: No     Allergies   Glipizide   Review of Systems Review of Systems  Constitutional: Negative for fatigue and fever.  Respiratory: Negative for cough, chest tightness, shortness of breath and wheezing.   Gastrointestinal: Negative for abdominal pain, nausea and vomiting.  Genitourinary: Negative for difficulty urinating and hematuria.  Musculoskeletal: Positive for back pain and myalgias. Negative for gait problem and joint swelling.  Skin: Negative for color change and wound.  Neurological: Positive for numbness. Negative for syncope and weakness.     Physical Exam Triage Vital Signs ED Triage Vitals  Enc Vitals Group     BP 01/26/20 0809 110/75     Pulse Rate 01/26/20 0809 (!) 116     Resp 01/26/20 0809 18     Temp 01/26/20 0809 98.3 F (36.8 C)     Temp Source 01/26/20 0809 Oral     SpO2 01/26/20 0809 100 %     Weight 01/26/20 0810 230 lb (104.3 kg)     Height 01/26/20 0810 $RemoveBefor'6\' 2"'cFIGXQXoUrMZ$  (1.88 m)     Head Circumference --      Peak Flow --      Pain Score 01/26/20 0809 7     Pain Loc --      Pain Edu? --      Excl. in [place]? --    No data found.  Updated Vital Signs BP 110/75 (BP Location: Left Arm)   Pulse (!) 116   Temp 98.3 F (36.8 C) (Oral)   Resp 18   Ht $R'6\' 2"'Iv$  (1.88 m)   Wt 230 lb (104.3 kg)   SpO2 100%   BMI 29.53 kg/m  Physical Exam Vitals and nursing note reviewed.  Constitutional:      General: He is not in acute distress.    Appearance: Normal appearance. He is  well-developed. He is not ill-appearing or toxic-appearing.     Comments: Patient does appear uncomfortable in exam room  HENT:     Head: Normocephalic and atraumatic.  Eyes:     General: No scleral icterus.    Conjunctiva/sclera: Conjunctivae normal.  Cardiovascular:     Rate and Rhythm: Regular rhythm. Tachycardia present.     Heart sounds: Normal heart sounds.  Pulmonary:     Effort: Pulmonary effort is normal. No respiratory distress.     Breath sounds: Wheezing (diffuse mild wheezing throughout) present. No rhonchi or rales.  Abdominal:     Palpations: Abdomen is soft.     Tenderness: There is no abdominal tenderness.  Musculoskeletal:     Cervical back: Neck supple.     Lumbar back: Tenderness (moderate TTP L4-S1 as well as bilateral paralumbar muscles) and bony tenderness (L4-S1) present. Decreased range of motion. Positive left straight leg raise test. Negative right straight leg raise test.  Skin:    General: Skin is warm and dry.  Neurological:     General: No focal deficit present.     Mental Status: He is alert. Mental status is at baseline.     Motor: No weakness.     Gait: Gait normal.  Psychiatric:        Mood and Affect: Mood normal.        Behavior: Behavior normal.        Thought Content: Thought content normal.      UC Treatments / Results  Labs (all labs ordered are listed, but only abnormal results are displayed) Labs Reviewed - No data to display  EKG   Radiology DG Lumbar Spine Complete  Result Date: 01/26/2020 CLINICAL DATA:  Pain following fall EXAM: LUMBAR SPINE - COMPLETE 4+ VIEW COMPARISON:  None. FINDINGS: Frontal, lateral, spot lumbosacral lateral, and bilateral oblique views were obtained. There are 5 non-rib-bearing lumbar type vertebral bodies. There is no fracture or spondylolisthesis. Disc spaces appear unremarkable. There is no appreciable facet arthropathy. There are foci of aortic atherosclerosis. IMPRESSION: No fracture or  spondylolisthesis. No appreciable arthropathic change. Aortic Atherosclerosis (ICD10-I70.0). Electronically Signed   By: Lowella Grip III M.D.   On: 01/26/2020 09:03    Procedures Procedures (including critical care time)  Medications Ordered in UC Medications - No data to display  Initial Impression / Assessment and Plan / UC Course  I have reviewed the triage vital signs and the nursing notes.  Pertinent labs & imaging results that were available during my care of the patient were reviewed by me and considered in my medical decision making (see chart for details).   54 year old male presenting for back pain following an injury.  He does have some spinal tenderness in the lumbar region so imaging of the spine obtained.  Imaging reviewed.  No indication of fracture or other acute abnormality.  Discussed results with patient.  Advised he can switch between ice and heat, muscle rubs, Tylenol for pain that does not take care of the pain can take Norco.  Patient has a history of chronic kidney disease so that is why did not prescribe an NSAID.  Reviewed controlled substance database and patient low risk for abuse.  Also provided him with baclofen to take as needed for muscle cramps and spasms.  Patient has an upcoming appointment  in a couple months with pain clinic for his chronic back pain.  Advised him just follow-up with them at that time.  Advised him to go to [facility] or orthopedics if he has any worsening back pain or is not getting better over the next couple weeks.  ED precautions discussed.  Final Clinical Impressions(s) / UC Diagnoses   Final diagnoses:  Acute bilateral low back pain without sciatica  Strain of lumbar region, initial encounter     Discharge Instructions     BACK PAIN: Stressed avoiding painful activities . RICE (REST, ICE, COMPRESSION, ELEVATION) guidelines reviewed. May alternate ice and heat. Consider use of muscle rubs, Salonpas patches, etc. Use  medications as directed including muscle relaxers if prescribed. T  F/u with PCP in 7-10 days for reexamination, and please feel free to call or return to the urgent care at any time for any questions or concerns you may have and we will be happy to help you!   BACK PAIN RED FLAGS: If the back pain acutely worsens or there are any red flag symptoms such as numbness/tingling, leg weakness, saddle anesthesia, or loss of bowel/bladder control, go immediately to the ER. Follow up with [place] as scheduled or sooner if the pain does not begin to resolve or if it worsens before the follow up      ED Prescriptions    Medication Sig Dispense Auth. Provider   baclofen (LIORESAL) 10 MG tablet Take 1 tablet (10 mg total) by mouth 3 (three) times daily as needed for up to 10 days for muscle spasms. 30 each Danton Clap, PA-C   HYDROcodone-acetaminophen (NORCO/VICODIN) 5-325 MG tablet Take 1 tablet by mouth every 6 (six) hours as needed for up to 3 days for severe pain. 10 tablet Danton Clap, PA-C     I have reviewed the PDMP during this encounter.   Danton Clap, PA-C 01/26/20 [phone]

## 2020-02-29 ENCOUNTER — Other Ambulatory Visit: Payer: Self-pay | Admitting: Neurology

## 2020-02-29 DIAGNOSIS — M5417 Radiculopathy, lumbosacral region: Secondary | ICD-10-CM

## 2020-03-11 ENCOUNTER — Ambulatory Visit
Admission: RE | Admit: 2020-03-11 | Discharge: 2020-03-11 | Disposition: A | Discharge status: Home / Self Care | Payer: Medicaid Other | Source: Ambulatory Visit | Attending: Neurology | Admitting: Neurology

## 2020-03-11 ENCOUNTER — Other Ambulatory Visit: Payer: Self-pay

## 2020-03-11 DIAGNOSIS — M5417 Radiculopathy, lumbosacral region: Secondary | ICD-10-CM | POA: Diagnosis not present

## 2020-03-11 IMAGING — MR MR LUMBAR SPINE W/O CM
5 series · 31 of 48 positions shown · non-contrast
Comparison: Lumbar radiographs [DATE].

CLINICAL DATA: Progressive chronic low back pain. Radiating into
bilateral legs, right worse than left.

EXAM:
MRI LUMBAR SPINE WITHOUT CONTRAST
TECHNIQUE: Multiplanar, multisequence MR imaging of the lumbar spine was
performed. No intravenous contrast was administered.

[Series 5: T2 · sagittal · 4.0mm · 0.81mm/px · 6 of 17 slices shown (1 of 2)]
[im 1/17]
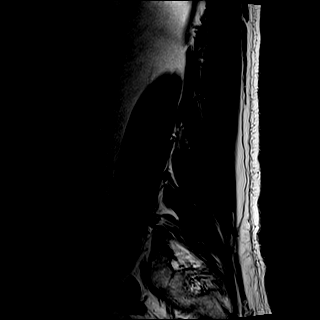
[im 4/17]
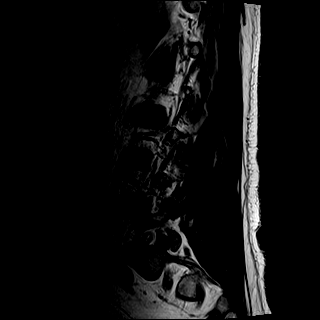
[im 7/17]
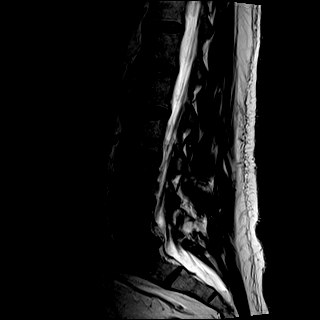
[im 10/17]
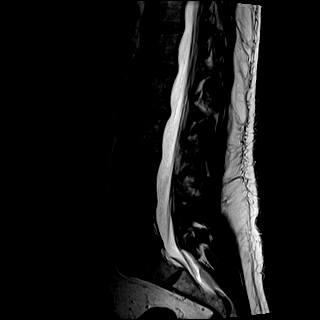
[im 13/17]
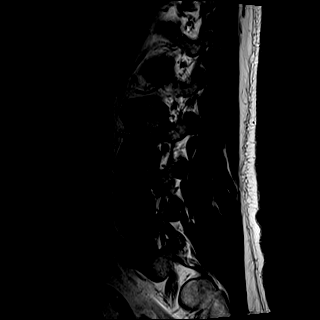
[im 17/17]
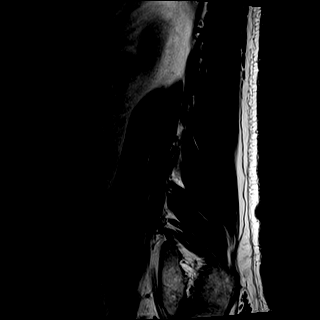

[Series 6: T1 · sagittal · 4.0mm · 0.81mm/px · 6 of 17 slices shown (1 of 2)]
[im 1/17]
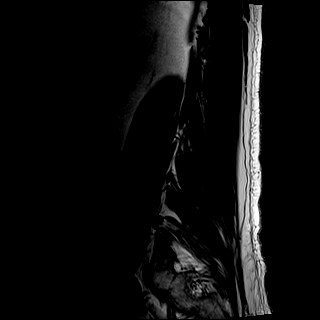
[im 4/17]
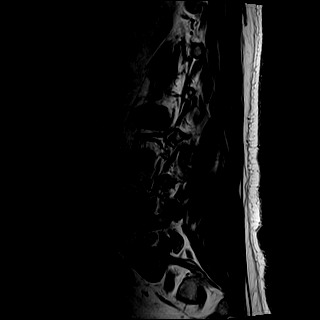
[im 7/17]
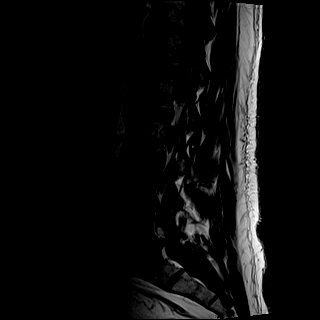
[im 10/17]
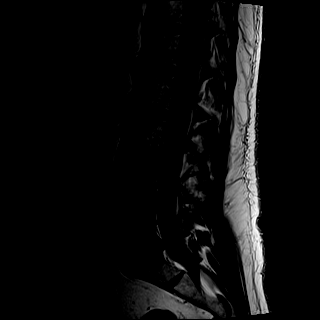
[im 13/17]
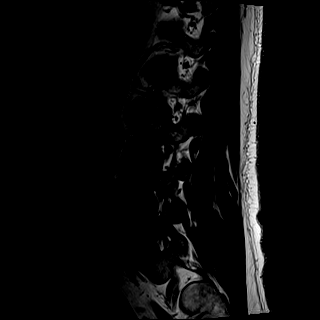
[im 17/17]
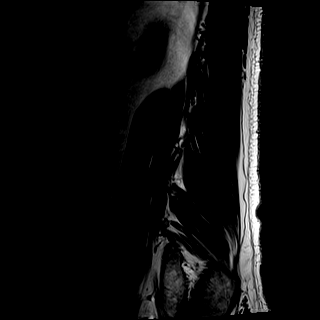

[Series 7: STIR · sagittal · 4.0mm · 0.41mm/px · 1 of 17 slices shown]
[im 1/17]
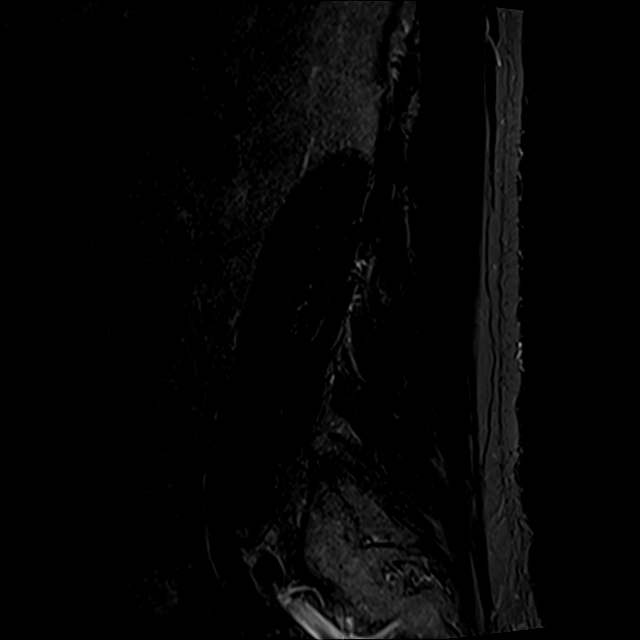

[Series 8: T2 · axial · 4.0mm · 0.78mm/px · z∈[-84,+144]mm · 9 of 40 slices shown (2 of 2)]
[im 1/40]
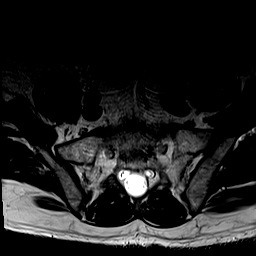
[im 6/40]
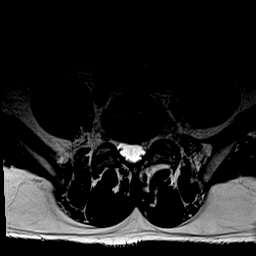
[im 12/40]
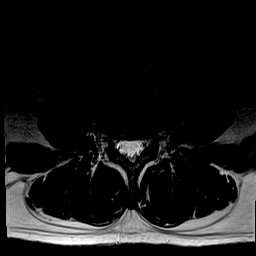
[im 17/40]
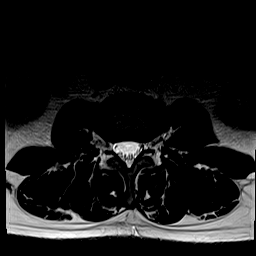
[im 20/40]
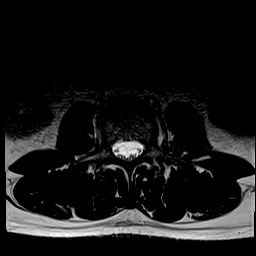
[im 23/40]
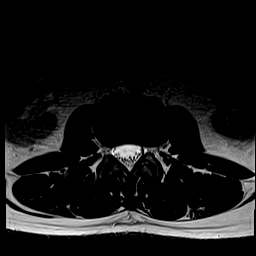
[im 28/40]
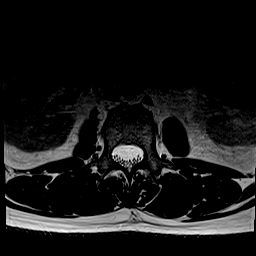
[im 34/40]
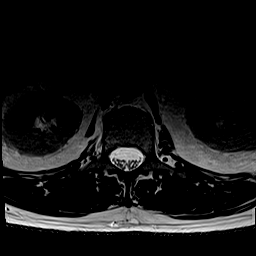
[im 40/40]
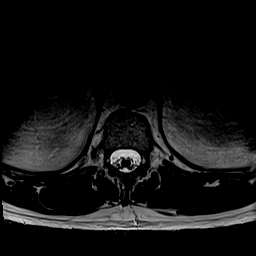

[Series 9: T1 · axial · 4.0mm · 0.39mm/px · z∈[-84,+144]mm · 9 of 40 slices shown (2 of 2)]
[im 1/40]
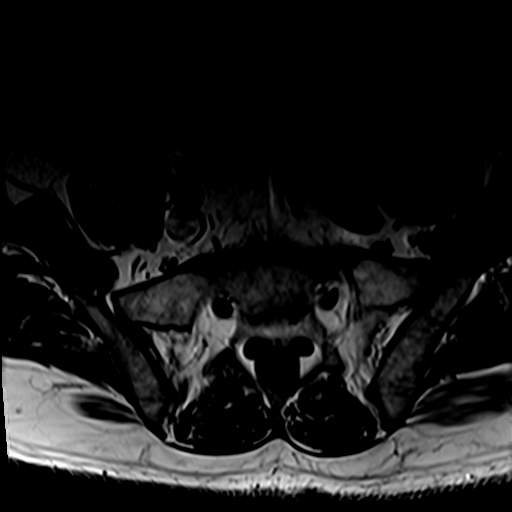
[im 6/40]
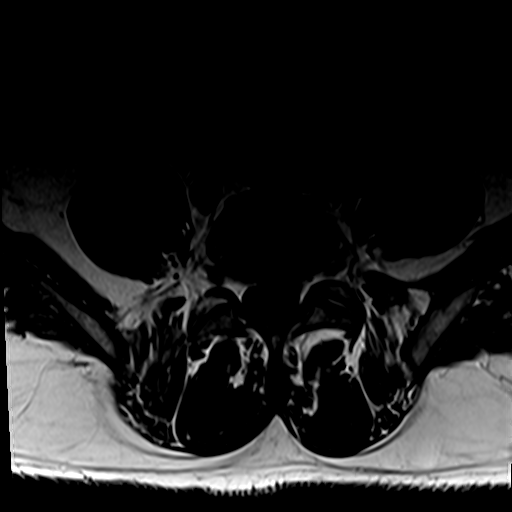
[im 12/40]
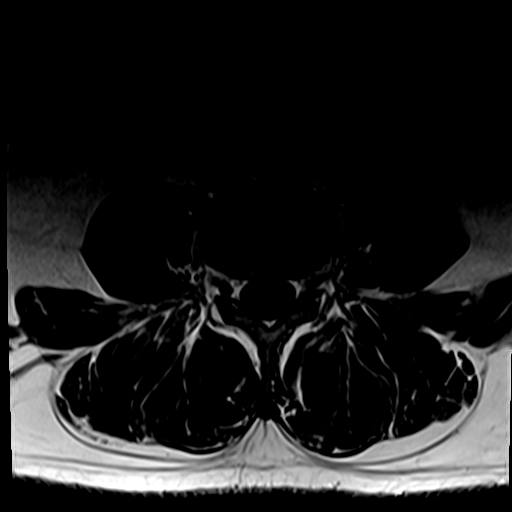
[im 17/40]
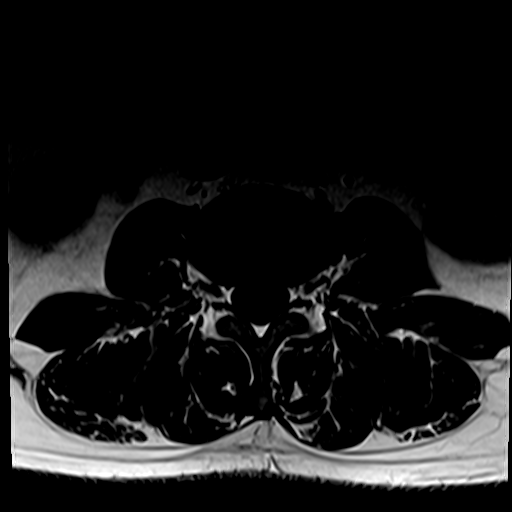
[im 20/40]
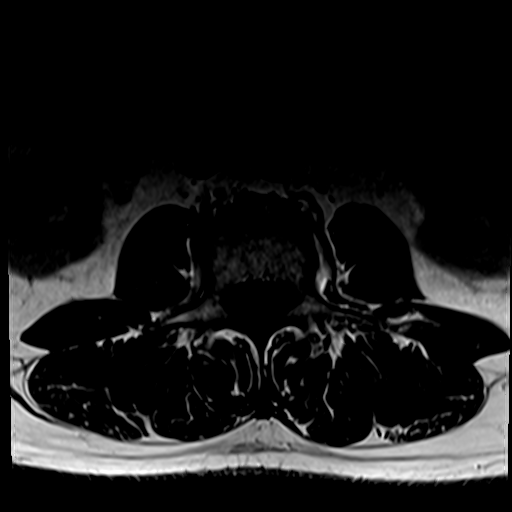
[im 23/40]
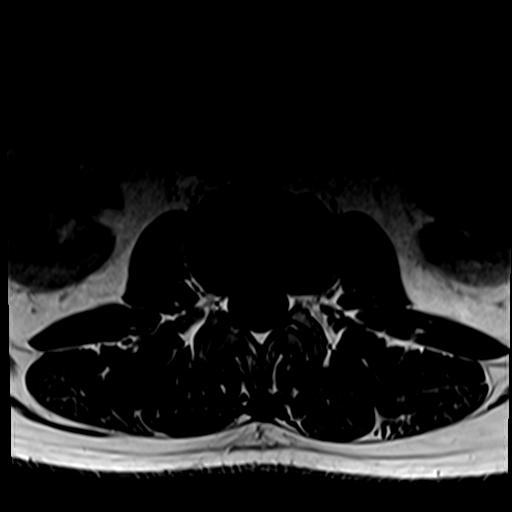
[im 28/40]
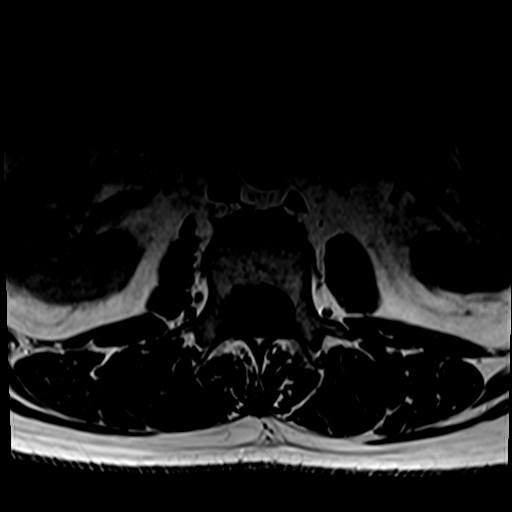
[im 34/40]
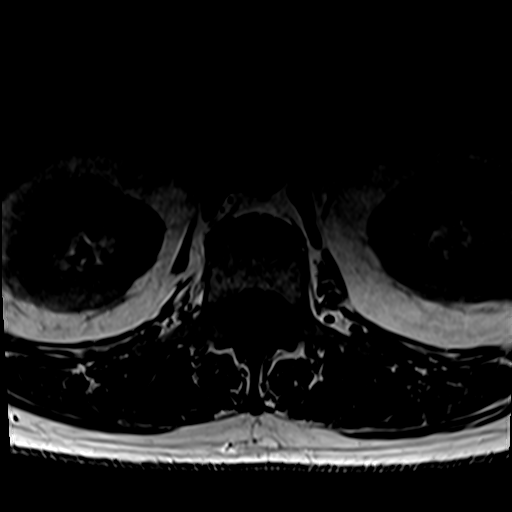
[im 40/40]
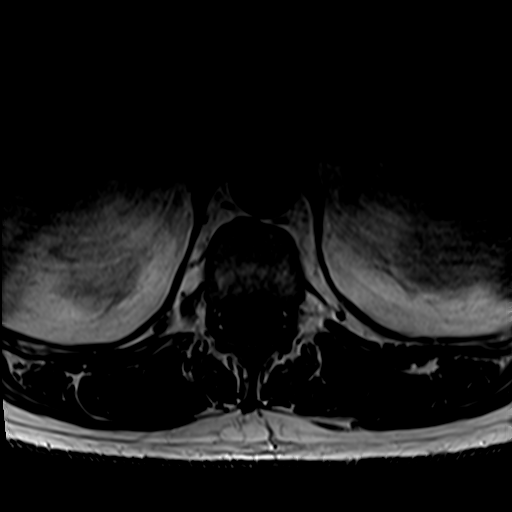

[31 of 48 positions shown; findings below may reference images not displayed]

FINDINGS: Segmentation: The inferior-most fully formed intervertebral disc is
labeled L5-S1.

Alignment:  Physiologic.

Vertebrae:  Vertebral body heights are maintained.

Conus medullaris and cauda equina: Conus extends to the L1 level.
Conus appears normal.

Paraspinal and other soft tissues: Unremarkable.

Disc levels:

T12-L1: No significant disc protrusion, foraminal stenosis, or canal
stenosis.

L1-L2: No significant disc protrusion, foraminal stenosis, or canal
stenosis.

L2-L3: Mild disc bulging without significant canal or foraminal
stenosis.

L3-L4: No significant disc protrusion, foraminal stenosis, or canal
stenosis.

L4-L5: Mild broad disc bulge without significant canal or foraminal
stenosis.

L5-S1: Disc desiccation and height loss. Broad-based disc bulge with
superimposed central disc protrusion. No significant central canal
or foraminal stenosis. Mild left subarticular recess stenosis
IMPRESSION: Mild multilevel degenerative change (as detailed above) without
significant canal or foraminal stenosis. Mild left subarticular
recess stenosis at L5-S1.

## 2020-03-13 ENCOUNTER — Ambulatory Visit (INDEPENDENT_AMBULATORY_CARE_PROVIDER_SITE_OTHER): Payer: Medicaid Other

## 2020-03-13 ENCOUNTER — Ambulatory Visit
Admission: EM | Admit: 2020-03-13 | Discharge: 2020-03-13 | Disposition: A | Discharge status: Home / Self Care | Payer: Medicaid Other | Attending: Family Medicine | Admitting: Family Medicine

## 2020-03-13 ENCOUNTER — Other Ambulatory Visit: Payer: Self-pay

## 2020-03-13 ENCOUNTER — Encounter: Payer: Self-pay | Admitting: Emergency Medicine

## 2020-03-13 DIAGNOSIS — R509 Fever, unspecified: Secondary | ICD-10-CM

## 2020-03-13 DIAGNOSIS — R0602 Shortness of breath: Secondary | ICD-10-CM

## 2020-03-13 DIAGNOSIS — R059 Cough, unspecified: Secondary | ICD-10-CM | POA: Diagnosis not present

## 2020-03-13 DIAGNOSIS — J441 Chronic obstructive pulmonary disease with (acute) exacerbation: Secondary | ICD-10-CM | POA: Diagnosis present

## 2020-03-13 DIAGNOSIS — J449 Chronic obstructive pulmonary disease, unspecified: Secondary | ICD-10-CM | POA: Diagnosis not present

## 2020-03-13 DIAGNOSIS — Z7951 Long term (current) use of inhaled steroids: Secondary | ICD-10-CM | POA: Diagnosis not present

## 2020-03-13 DIAGNOSIS — Z87891 Personal history of nicotine dependence: Secondary | ICD-10-CM | POA: Diagnosis not present

## 2020-03-13 DIAGNOSIS — Z20822 Contact with and (suspected) exposure to covid-19: Secondary | ICD-10-CM | POA: Diagnosis not present

## 2020-03-13 LAB — RESP PANEL BY RT-PCR (FLU A&B, COVID) ARPGX2
Influenza A by PCR: NEGATIVE
Influenza B by PCR: NEGATIVE
SARS Coronavirus 2 by RT PCR: NEGATIVE

## 2020-03-13 IMAGING — CR DG CHEST 2V
2 series · 3 of 3 positions shown · non-contrast
Comparison: Chest x-rays dated [DATE], [DATE] and
[DATE].

CLINICAL DATA: Cough, fever, shortness of breath, COPD.

EXAM:
CHEST - 2 VIEW

[chest pa]
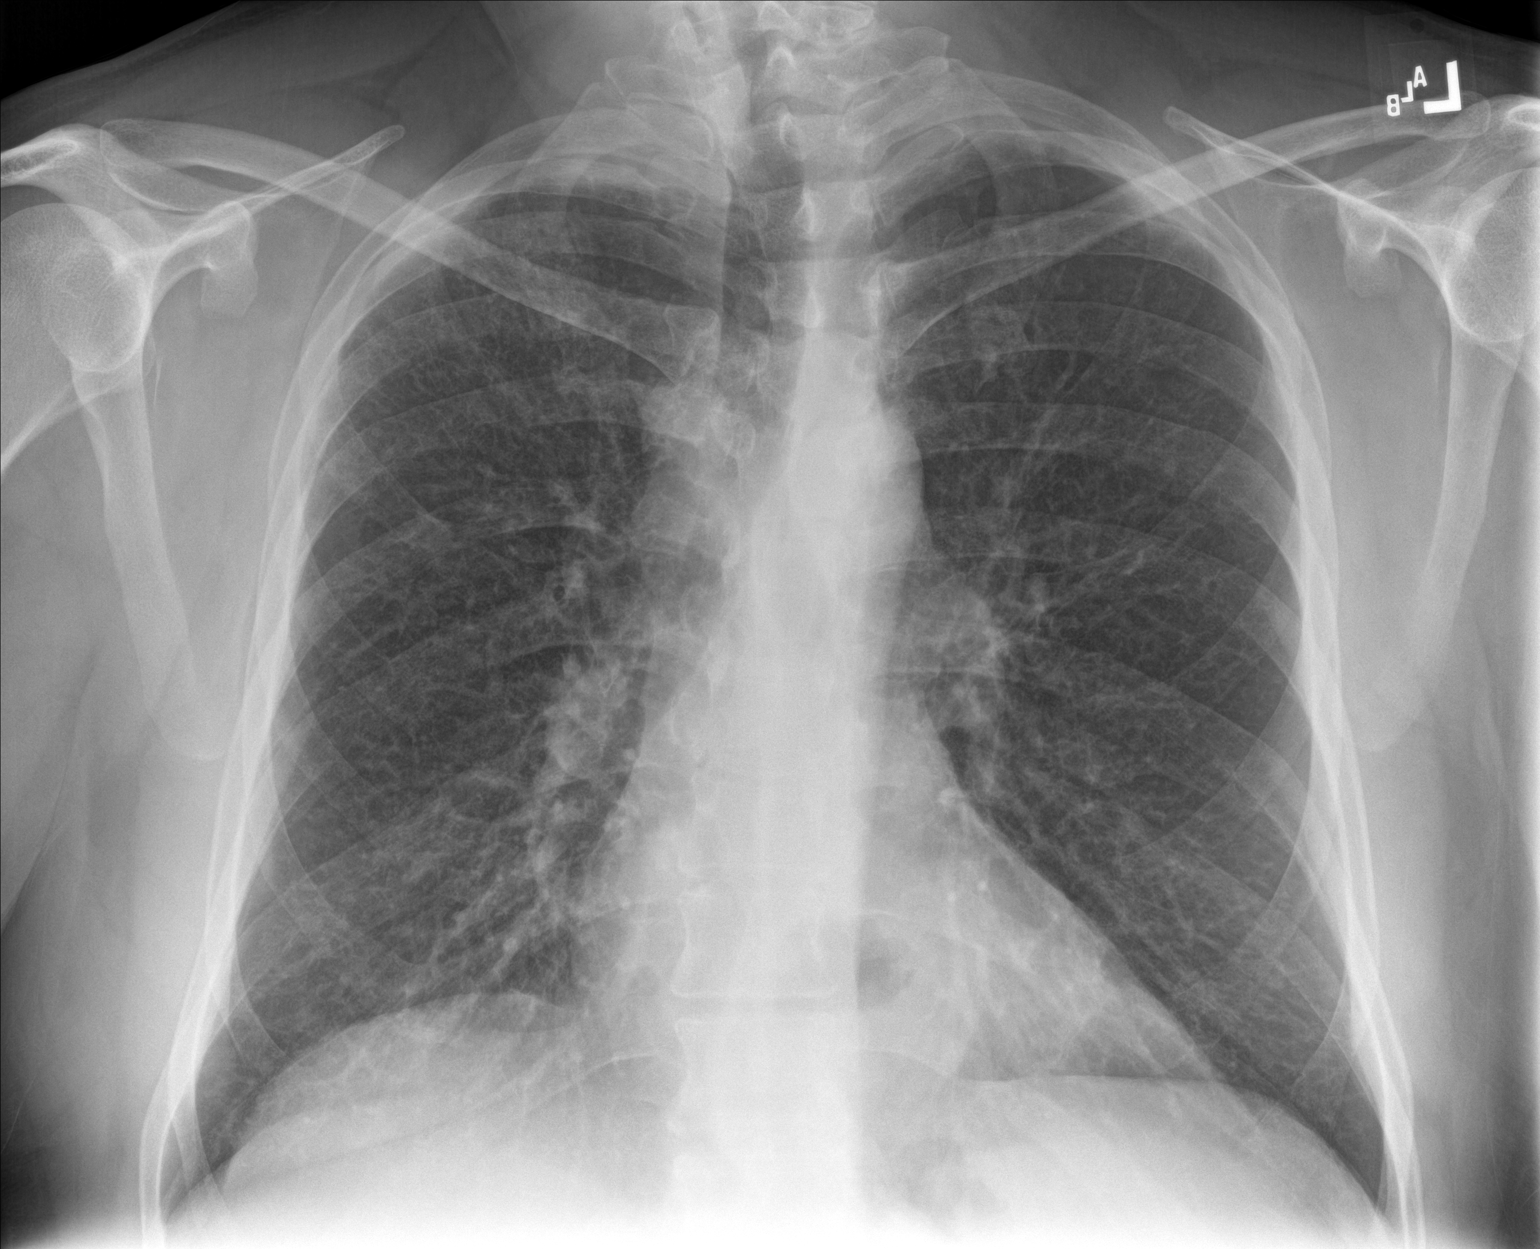

[Series 2: chest lat · 0.14mm/px · 2 of 2 slices shown]
[im 1/2]
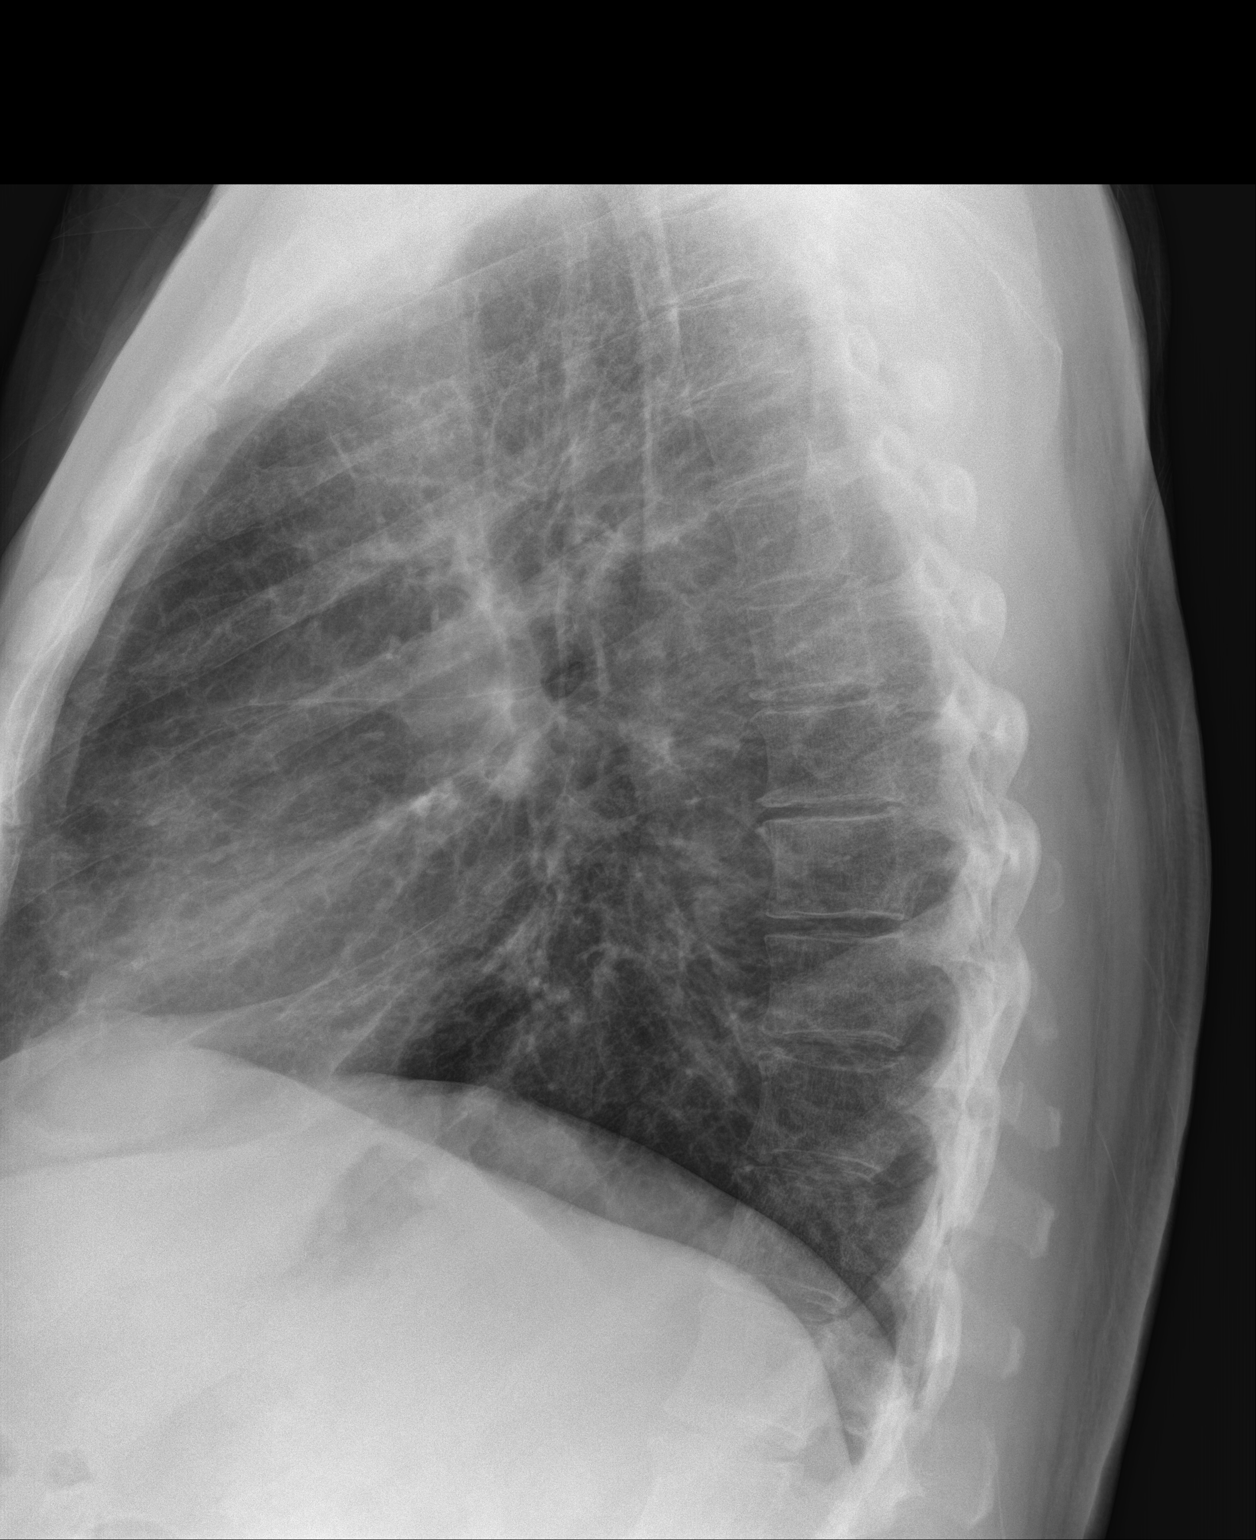
[im 2/2]
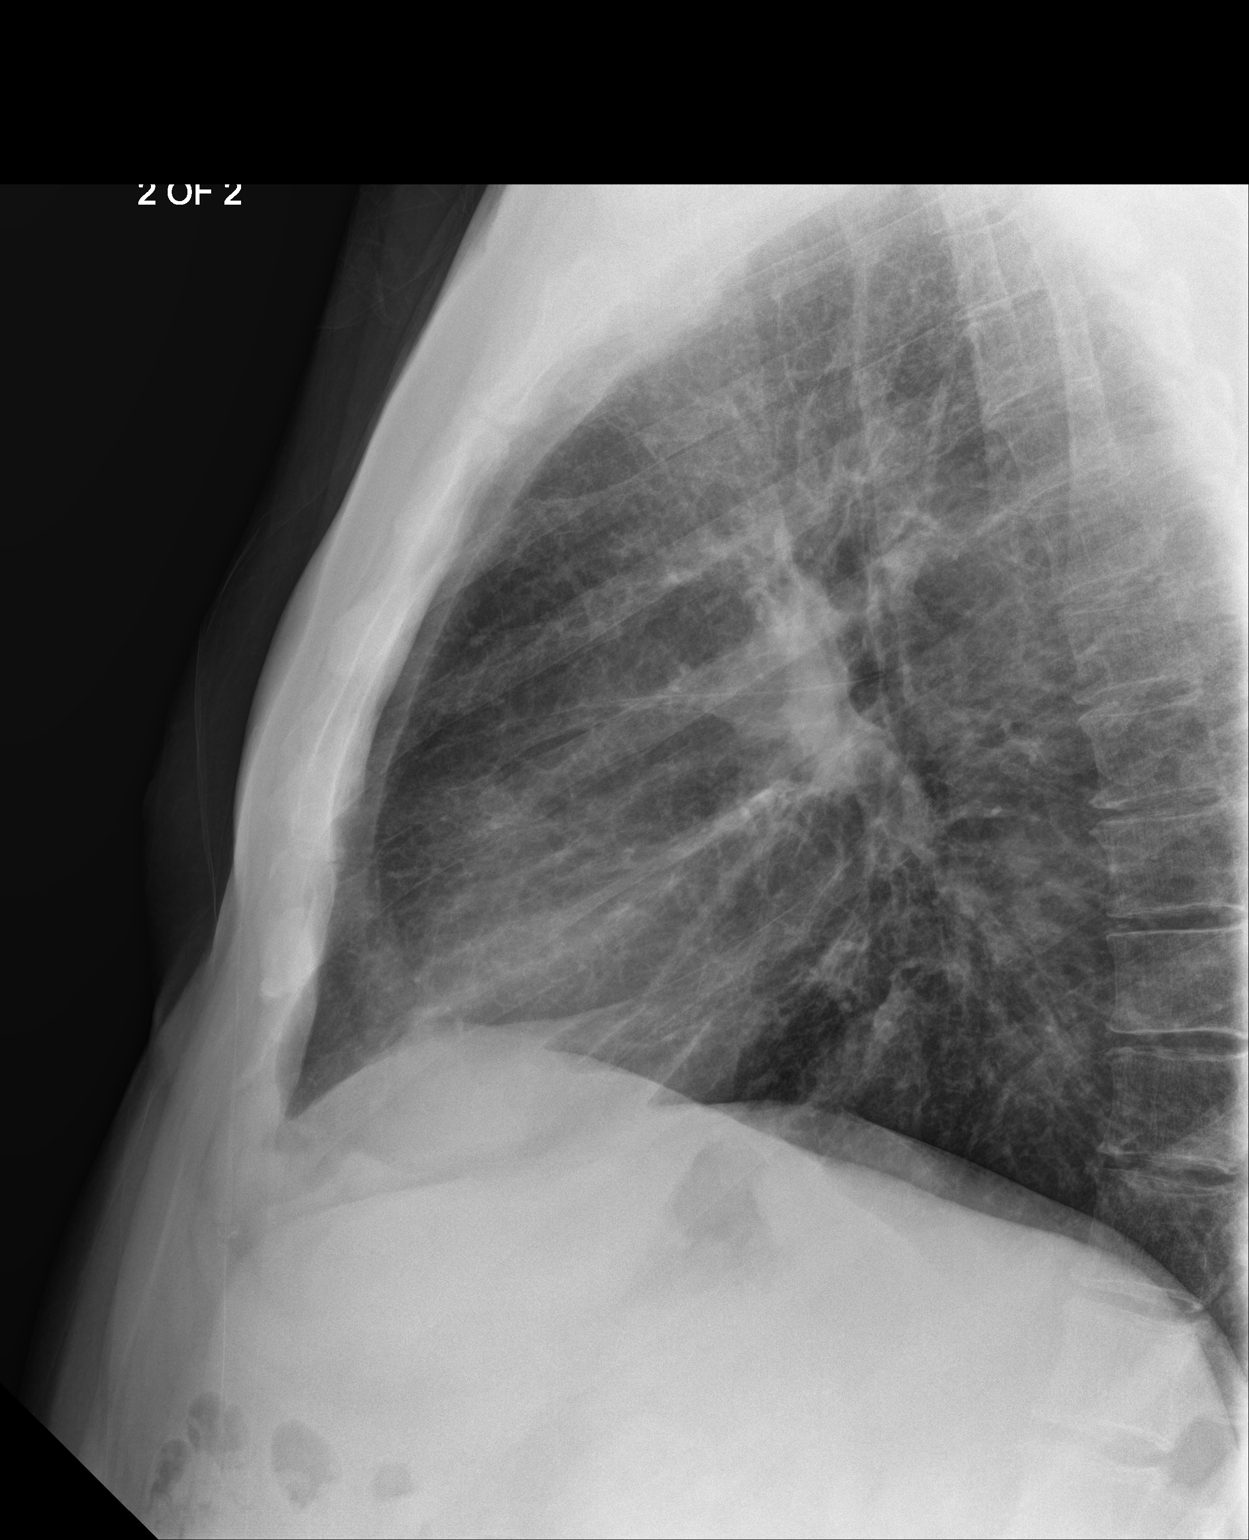

[3 of 3 positions shown; findings below may reference images not displayed]

FINDINGS: Heart size and mediastinal contours are stable. Lungs are
hyperexpanded. Coarse lung markings are seen throughout both lungs
indicating some degree of underlying chronic interstitial lung
disease. Chronic bronchitic changes noted centrally. No confluent
opacity to suggest a developing pneumonia. No pleural effusion or
pneumothorax is seen. No acute appearing osseous abnormality. Mild
scoliosis of the thoracolumbar spine.
IMPRESSION: 1. No active cardiopulmonary disease. No evidence of pneumonia or
pulmonary edema.
2. Hyperexpanded lungs indicating COPD. Associated chronic
interstitial lung disease and chronic bronchitic changes.

## 2020-03-13 MED ORDER — BENZONATATE 100 MG PO CAPS: 200.0000 mg | ORAL_CAPSULE | Freq: Three times a day (TID) | ORAL | 0 refills | Status: AC | PRN

## 2020-03-13 NOTE — ED Triage Notes (Addendum)
Patient c/o cough and SOB, fever, generalized body aches that started 3 days ago. He states his pulmonary doctor called in Prednisone and Augmentin yesterday. He states he needs something for his cough and requesting a COVID test.

## 2020-03-13 NOTE — Discharge Instructions (Signed)
Your flu and Covid test were both negative.  Chest x-ray does not indicate any pneumonia or other acute findings.  I suspect you are having a COPD exacerbation probably due to another viral infection.  Continue the medications as prescribed by your pulmonologist.  I have sent Tessalon Perles for cough.  Increase rest and fluids as well.  I suggest asking your pulmonologist or PCP for prescription for a nebulizer machine.  This may help you little more than just the albuterol inhaler from when he having shortness of breath episodes better or worse.  Go to ED if you have any increased breathing difficulty, chest pain, fevers you cannot control with Tylenol or Motrin, weakness or any other severe acute worsening symptoms.

## 2020-03-13 NOTE — ED Provider Notes (Signed)
[facility]    CSN: [id number] Arrival date & time: 03/13/20  4365      History   Chief Complaint Chief Complaint  Patient presents with   Cough   Shortness of Breath   Fever    HPI Darren Allen is a 54 y.o. male presenting for 3-day history of cough, shortness of breath, fevers, generalized body aches.  Patient states that he spoke with his pulmonologist to send in prednisone and Augmentin for him yesterday.  The patient requests a Covid test and something to help with his cough.  Patient states that his temperatures have been up to 99 degrees.  He states he has felt fatigued and has had some increased breathing difficulty and wheezing.  Patient states he has been using his regular inhalers which are Trelegy and albuterol.  States he has been using his albuterol inhaler once every hour or 2 and it does not seem to be helping as much as it normally has.  He started his prednisone yesterday.  He denies any pain in his chest.  States that the cough is dry.  Denies sore throat but does admit to nasal congestion and some headaches.  No abdominal pain, nausea/vomiting/diarrhea or changes in smell or taste.  Patient denies any known Covid exposure and has been fully vaccinated.  Past medical history significant for COPD, diabetes, hypertension, CKD stage III, sleep apnea, anxiety, and low testosterone with long-term use of testosterone.  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   Calculus of kidney 02/04/2015   Perianal abscess 02/04/2015   Perirectal abscess    Chronic obstructive pulmonary disease (HCC)  01/17/2013   Acid reflux 01/17/2013   HLD (hyperlipidemia) 01/17/2013   Apnea, sleep 01/17/2013   Testicular hypofunction 01/17/2013   Allergic state 01/17/2013   Adiposity 07/06/2011    Past Surgical History:  Procedure Laterality Date   APPENDECTOMY     INCISION AND DRAINAGE PERIRECTAL ABSCESS N/A 02/04/2015   Procedure: IRRIGATION AND DEBRIDEMENT PERIRECTAL ABSCESS;  Surgeon: Ida Rogue, MD;  Location: [facility];  Service: General;  Laterality: N/A;   ORIF ANKLE FRACTURE Left 03/23/2017   Procedure: OPEN REDUCTION INTERNAL FIXATION (ORIF) ANKLE FRACTURE;  Surgeon: Signa Kell, MD;  Location: [facility];  Service: Orthopedics;  Laterality: Left;   RECTAL EXAM UNDER ANESTHESIA  02/04/2015   Procedure: RECTAL EXAM UNDER ANESTHESIA;  Surgeon: Ida Rogue, MD;  Location: [facility];  Service: General;;   SYNDESMOSIS REPAIR Left 03/23/2017   Procedure: SYNDESMOSIS REPAIR;  Surgeon: Signa Kell, MD;  Location: [facility];  Service: Orthopedics;  Laterality: Left;       Home Medications    Prior to Admission medications   Medication Sig Start Date End Date Taking? Authorizing Provider  acetaminophen (TYLENOL) 500 MG tablet Take 2 tablets (1,000 mg total) by mouth every 8 (eight) hours. 03/24/17  Yes Dedra Skeens, PA-C  amoxicillin-clavulanate (AUGMENTIN) 875-125 MG tablet Take 1 tablet by mouth every 12 (twelve) hours. 02/06/15  Yes Ida Rogue, MD  aspirin EC 325 MG EC tablet Take 1 tablet (325 mg total) by mouth daily.  03/24/17  Yes Reche Dixon, PA-C  atorvastatin (LIPITOR) 40 MG tablet Take 40 mg by mouth at bedtime. 09/14/19  Yes [provider]  azithromycin (ZITHROMAX) 250 MG tablet TK UTD 03/18/17  Yes [provider]  benzonatate (TESSALON) 100 MG capsule Take 2 capsules (200 mg total) by mouth 3 (three) times daily as needed for up to 10 days for cough. 03/13/20 03/23/20 Yes Danton Clap, PA-C  budesonide-formoterol (SYMBICORT) 80-4.5  MCG/ACT inhaler Inhale 2 puffs into the lungs 2 (two) times daily.   Yes [provider]  fluconazole (DIFLUCAN) 200 MG tablet Take 1 tablet (200 mg total) by mouth daily. 02/15/15  Yes Loflin, Lavona [place], MD  gabapentin (NEURONTIN) 300 MG capsule Take 300 mg by mouth 3 (three) times daily. 01/07/20  Yes [provider]  hydrochlorothiazide (HYDRODIURIL) 25 MG tablet Take 1 tablet by mouth daily. 02/26/17  Yes [provider]  hydrOXYzine (ATARAX/VISTARIL) 25 MG tablet Take 1 tablet by mouth 3 (three) times daily as needed for anxiety. 01/08/20  Yes [provider]  insulin isophane & regular human (HUMULIN 70/30 KWIKPEN) (70-30) 100 UNIT/ML KwikPen Inject 40 Units into the skin 2 (two) times daily with a meal. 10/14/19 10/13/20 Yes [provider]  insulin lispro (HUMALOG) 100 UNIT/ML injection Check your blood glucose (BG) 3 times daily with meals and administer the following amount subcutaneously per scale below: BG < 120: 0 units BG 121 - 150: 3 units BG 151 - 200: 4 units BG 201 - 250: 7 units BG 251 - 300: 11 units BG 301 - 350: 15 units BG 351 - 400: 20 units BG > 400: call your doctor or go to the ER 12/08/19 12/07/20 Yes Alfred Levins, [place], MD  JANUVIA 50 MG tablet Take 1 tablet by mouth daily. 02/26/17  Yes [provider]  lisinopril (PRINIVIL,ZESTRIL) 10 MG tablet Take 10 mg by mouth daily.   Yes [provider]  lisinopril (PRINIVIL,ZESTRIL) 40 MG tablet Take 1 tablet by mouth daily. 02/26/17  Yes [provider]  metFORMIN (GLUCOPHAGE-XR) 500 MG 24 hr tablet Take 4 tablets by mouth daily. 02/26/17  Yes [provider]  nicotine (NICODERM CQ - DOSED IN MG/24 HOURS) 21 mg/24hr patch Place 1 patch onto the skin daily. 06/01/14  Yes [provider]  omeprazole (PRILOSEC OTC) 20 MG tablet Take 20 mg by mouth 2 (two) times daily.   Yes [provider]  ondansetron (ZOFRAN) 4 MG tablet Take 1  tablet (4 mg total) by mouth every 6 (six) hours as needed for nausea. 03/24/17  Yes Reche Dixon, PA-C  oxyCODONE (OXY IR/ROXICODONE) 5 MG immediate release tablet Take 1-2 tablets (5-10 mg total) by mouth every 4 (four) hours as needed for breakthrough pain. 03/24/17  Yes Reche Dixon, PA-C  pantoprazole (PROTONIX) 40 MG tablet Take 2 tablets by mouth daily. 03/03/17  Yes [provider]  predniSONE (DELTASONE) 20 MG tablet Take 3 tablets (60 mg total) by mouth daily with breakfast. 02/11/15  Yes Schaevitz, Randall An, MD  predniSONE (STERAPRED UNI-PAK 48 TAB) 10 MG (48) TBPK tablet TK PO UTD 03/17/17  Yes [provider]  Testosterone Cypionate 200 MG/ML KIT Inject 200 mg into the muscle every 14 (fourteen) days.   Yes [provider]  tiotropium (SPIRIVA) 18 MCG inhalation capsule Place 18 mcg into inhaler and inhale daily.   Yes [provider]  TRELEGY ELLIPTA 100-62.5-25 MCG/INH AEPB Inhale 1 puff into the lungs daily. 12/29/19  Yes  [provider]  VENTOLIN HFA 108 (90 Base) MCG/ACT inhaler Inhale 2 puffs into the lungs every 6 (six) hours as needed. 03/22/17  Yes [provider]  lisinopril (PRINIVIL,ZESTRIL) 30 MG tablet Take 1 tablet (30 mg total) by mouth daily. Patient not taking: Reported on 03/23/2017 05/05/16 05/05/17  Gregor Hams, MD    Family History Family History  Problem Relation Age of Onset   Cancer Mother 23       Lung   Cancer Father        Colon   Heart disease Father    Alcohol abuse Father    Cancer Brother 29       Esophageal   Diabetes Brother    Heart disease Brother     Social History Social History   Tobacco Use   Smoking status: Former Smoker    Packs/day: 0.50    Years: 34.00    Pack years: 17.00    Types: Cigarettes    Quit date: 06/15/2019    Years since quitting: 0.7   Smokeless tobacco: Never Used   Tobacco comment: patient using Nicoderm patches  Vaping Use   Vaping Use: Never  used  Substance Use Topics   Alcohol use: Yes    Alcohol/week: 12.0 standard drinks    Types: 12 Cans of beer per week    Comment: varies- only drinks on weekend. 01/26/20 3 beers per weekend   Drug use: No     Allergies   Glipizide   Review of Systems Review of Systems  Constitutional: Positive for fatigue and fever.  HENT: Positive for congestion and rhinorrhea. Negative for sinus pressure, sinus pain and sore throat.   Respiratory: Positive for cough, shortness of breath and wheezing.   Cardiovascular: Negative for chest pain.  Gastrointestinal: Negative for abdominal pain, diarrhea, nausea and vomiting.  Musculoskeletal: Positive for myalgias.  Neurological: Positive for headaches. Negative for weakness and light-headedness.  Hematological: Negative for adenopathy.     Physical Exam Triage Vital Signs ED Triage Vitals [03/13/20 0858]  Enc Vitals Group     BP 132/82     Pulse Rate (!) 116     Resp 18     Temp 99.5 F (37.5 C)     Temp Source Oral     SpO2 98 %     Weight 235 lb (106.6 kg)     Height $Remov'6\' 2"'kyWgrq$  (1.88 m)     Head Circumference      Peak Flow      Pain Score 0     Pain Loc      Pain Edu?      Excl. in [place]?    No data found.  Updated Vital Signs BP 132/82 (BP Location: Right Arm)    Pulse (!) 116    Temp 99.5 F (37.5 C) (Oral)    Resp 18    Ht $R'6\' 2"'PM$  (1.88 m)    Wt 235 lb (106.6 kg)    SpO2 98%    BMI 30.17 kg/m       Physical Exam Vitals and nursing note reviewed.  Constitutional:      General: He is not in acute distress.    Appearance: Normal appearance. He is well-developed and well-nourished. He is not ill-appearing or diaphoretic.  HENT:     Head: Normocephalic and atraumatic.     Nose: Congestion and rhinorrhea present.     Mouth/Throat:     Mouth: Oropharynx is clear and moist and mucous  membranes are normal.     Pharynx: Uvula midline. No oropharyngeal exudate.     Tonsils: No tonsillar abscesses.  Eyes:     General: No scleral  icterus.       Right eye: No discharge.        Left eye: No discharge.     Extraocular Movements: EOM normal.     Conjunctiva/sclera: Conjunctivae normal.  Neck:     Thyroid: No thyromegaly.     Trachea: No tracheal deviation.  Cardiovascular:     Rate and Rhythm: Regular rhythm. Tachycardia present.     Heart sounds: Normal heart sounds.  Pulmonary:     Effort: Pulmonary effort is normal. No respiratory distress.     Breath sounds: Wheezing (diffuse wheezing and rhonchi throughout bilateral chest) and rhonchi present. No rales.  Musculoskeletal:     Cervical back: Normal range of motion and neck supple.  Lymphadenopathy:     Cervical: No cervical adenopathy.  Skin:    General: Skin is warm and dry.     Findings: No rash.  Neurological:     General: No focal deficit present.     Mental Status: He is alert. Mental status is at baseline.     Motor: No weakness.     Gait: Gait normal.  Psychiatric:        Mood and Affect: Mood normal.        Behavior: Behavior normal.        Thought Content: Thought content normal.      UC Treatments / Results  Labs (all labs ordered are listed, but only abnormal results are displayed) Labs Reviewed  RESP PANEL BY RT-PCR (FLU A&B, COVID) [id number]    EKG   Radiology DG Chest 2 View  Result Date: 03/13/2020 CLINICAL DATA:  Cough, fever, shortness of breath, COPD. EXAM: CHEST - 2 VIEW COMPARISON:  Chest x-rays dated 12/08/2019, 03/07/2019 and 05/05/2016. FINDINGS: Heart size and mediastinal contours are stable. Lungs are hyperexpanded. Coarse lung markings are seen throughout both lungs indicating some degree of underlying chronic interstitial lung disease. Chronic bronchitic changes noted centrally. No confluent opacity to suggest a developing pneumonia. No pleural effusion or pneumothorax is seen. No acute appearing osseous abnormality. Mild scoliosis of the thoracolumbar spine. IMPRESSION: 1. No active cardiopulmonary disease. No evidence  of pneumonia or pulmonary edema. 2. Hyperexpanded lungs indicating COPD. Associated chronic interstitial lung disease and chronic bronchitic changes. Electronically Signed   By: Bary Kerin M.D.   On: 03/13/2020 09:31   MR LUMBAR SPINE WO CONTRAST  Result Date: 03/11/2020 CLINICAL DATA:  Progressive chronic low back pain. Radiating into bilateral legs, right worse than left. EXAM: MRI LUMBAR SPINE WITHOUT CONTRAST TECHNIQUE: Multiplanar, multisequence MR imaging of the lumbar spine was performed. No intravenous contrast was administered. COMPARISON:  Lumbar radiographs January 26, 2020. FINDINGS: Segmentation: The inferior-most fully formed intervertebral disc is labeled L5-S1. Alignment:  Physiologic. Vertebrae:  Vertebral body heights are maintained. Conus medullaris and cauda equina: Conus extends to the L1 level. Conus appears normal. Paraspinal and other soft tissues: Unremarkable. Disc levels: T12-L1: No significant disc protrusion, foraminal stenosis, or canal stenosis. L1-L2: No significant disc protrusion, foraminal stenosis, or canal stenosis. L2-L3: Mild disc bulging without significant canal or foraminal stenosis. L3-L4: No significant disc protrusion, foraminal stenosis, or canal stenosis. L4-L5: Mild broad disc bulge without significant canal or foraminal stenosis. L5-S1: Disc desiccation and height loss. Broad-based disc bulge with superimposed central disc protrusion. No significant central canal or foraminal  stenosis. Mild left subarticular recess stenosis IMPRESSION: Mild multilevel degenerative change (as detailed above) without significant canal or foraminal stenosis. Mild left subarticular recess stenosis at L5-S1. Electronically Signed   By: Margaretha Sheffield MD   On: 03/11/2020 12:19    Procedures Procedures (including critical care time)  Medications Ordered in UC Medications - No data to display  Initial Impression / Assessment and Plan / UC Course  I have reviewed the  triage vital signs and the nursing notes.  Pertinent labs & imaging results that were available during my care of the patient were reviewed by me and considered in my medical decision making (see chart for details).   54 year old male with history of COPD presenting for 3-day history of cough, nasal congestion, increased breathing difficulty, low-grade fever and body aches.  Vitals are stable in the clinic.  Pulse is elevated at 116.  Other vital signs are normal and stable.  He has nasal congestion and rhinorrhea on exam and diffuse wheezing and rhonchi throughout bilateral chest.  He is in no acute distress and is well-appearing.  Respiratory panel obtained and all negative.  Chest x-ray obtained which does not show any acute abnormality. Independently reviewed images.  Discussed all results with patient.  Advised that he is likely having COPD exacerbation due to other viral infection.  Advised supportive care and continued medications as prescribed by his pulmonologist.  Advised him to keep close contact with pulmonologist in case they wanted to extend his course of prednisone.  Advised continuing Augmentin and breathing treatments/inhalers.  Advised going to ED for any acute worsening of symptoms including fevers up to 101, increased breathing difficulty, chest pain, worsening cough, increased fatigue or weakness.  I sent Tessalon Perles to pharmacy and encourage increasing rest and fluids.  Patient agreeable to treatment plan.  Final Clinical Impressions(s) / UC Diagnoses   Final diagnoses:  COPD exacerbation (HCC)  Cough  Shortness of breath     Discharge Instructions     Your flu and Covid test were both negative.  Chest x-ray does not indicate any pneumonia or other acute findings.  I suspect you are having a COPD exacerbation probably due to another viral infection.  Continue the medications as prescribed by your pulmonologist.  I have sent Tessalon Perles for cough.  Increase rest  and fluids as well.  I suggest asking your pulmonologist or PCP for prescription for a nebulizer machine.  This may help you little more than just the albuterol inhaler from when he having shortness of breath episodes better or worse.  Go to ED if you have any increased breathing difficulty, chest pain, fevers you cannot control with Tylenol or Motrin, weakness or any other severe acute worsening symptoms.    ED Prescriptions    Medication Sig Dispense Auth. Provider   benzonatate (TESSALON) 100 MG capsule Take 2 capsules (200 mg total) by mouth 3 (three) times daily as needed for up to 10 days for cough. 30 capsule Danton Clap, PA-C     PDMP not reviewed this encounter.   Danton Clap, PA-C 03/13/20 1015

## 2020-07-25 DIAGNOSIS — G8929 Other chronic pain: Secondary | ICD-10-CM | POA: Insufficient documentation

## 2020-10-01 ENCOUNTER — Ambulatory Visit
Admission: EM | Admit: 2020-10-01 | Discharge: 2020-10-01 | Disposition: A | Discharge status: Home / Self Care | Payer: Medicaid Other | Attending: Family Medicine | Admitting: Family Medicine

## 2020-10-01 ENCOUNTER — Other Ambulatory Visit: Payer: Self-pay

## 2020-10-01 ENCOUNTER — Ambulatory Visit (INDEPENDENT_AMBULATORY_CARE_PROVIDER_SITE_OTHER): Payer: Medicaid Other

## 2020-10-01 DIAGNOSIS — S39012A Strain of muscle, fascia and tendon of lower back, initial encounter: Secondary | ICD-10-CM

## 2020-10-01 DIAGNOSIS — M545 Low back pain, unspecified: Secondary | ICD-10-CM | POA: Diagnosis not present

## 2020-10-01 DIAGNOSIS — S161XXA Strain of muscle, fascia and tendon at neck level, initial encounter: Secondary | ICD-10-CM | POA: Diagnosis not present

## 2020-10-01 DIAGNOSIS — W19XXXA Unspecified fall, initial encounter: Secondary | ICD-10-CM

## 2020-10-01 DIAGNOSIS — M542 Cervicalgia: Secondary | ICD-10-CM

## 2020-10-01 IMAGING — CR DG CERVICAL SPINE COMPLETE 4+V
7 series · 7 of 7 positions shown · non-contrast
Comparison: None.

CLINICAL DATA: Neck pain after fall.

EXAM:
CERVICAL SPINE - COMPLETE 4+ VIEW

[c-spine obl (1 of 2)]
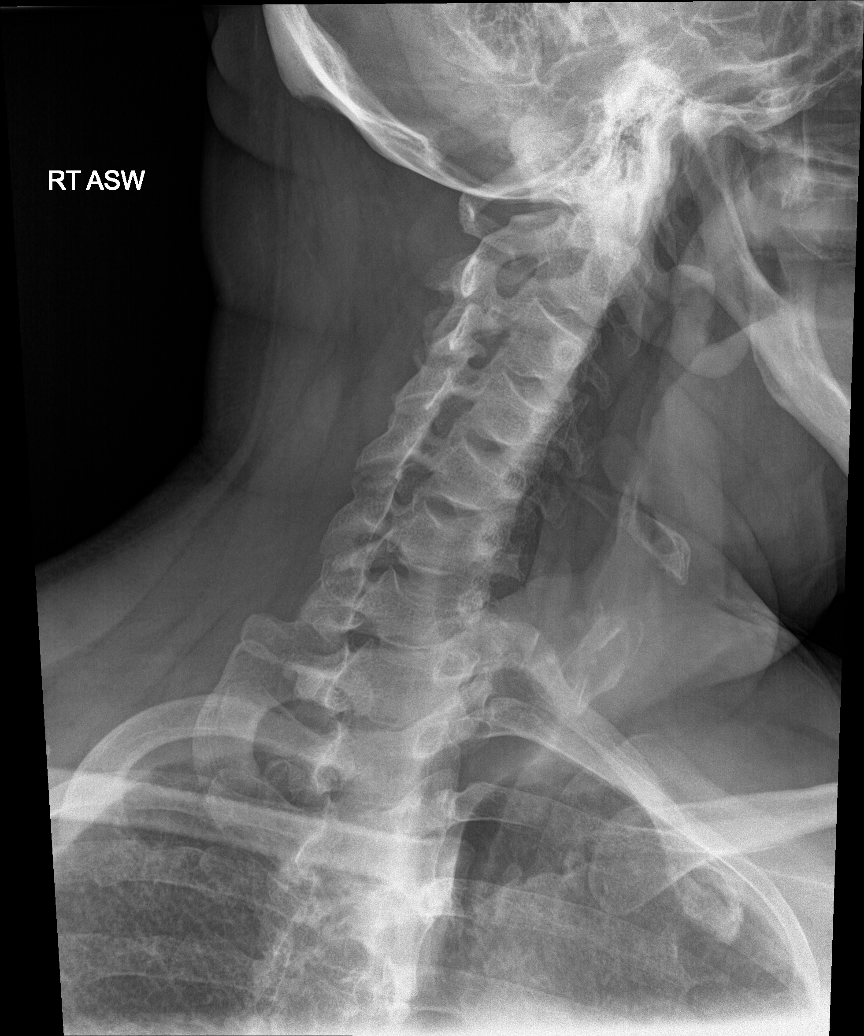

[c-spine obl (2 of 2)]
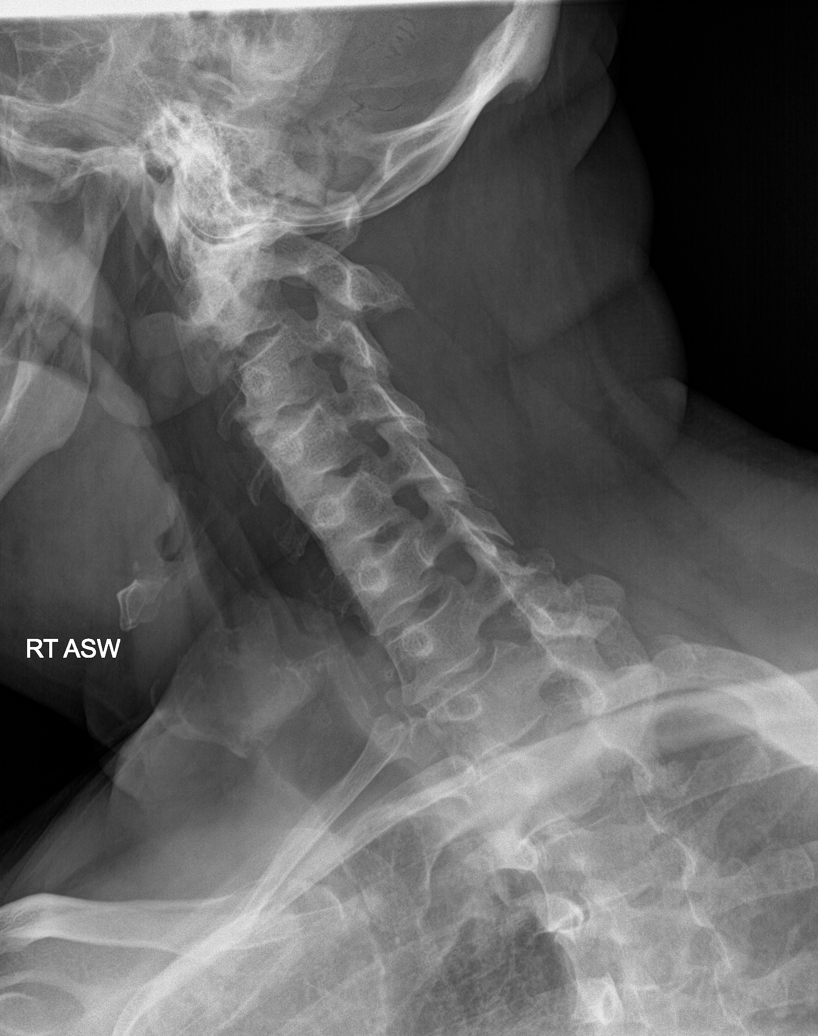

[c-spine ap]
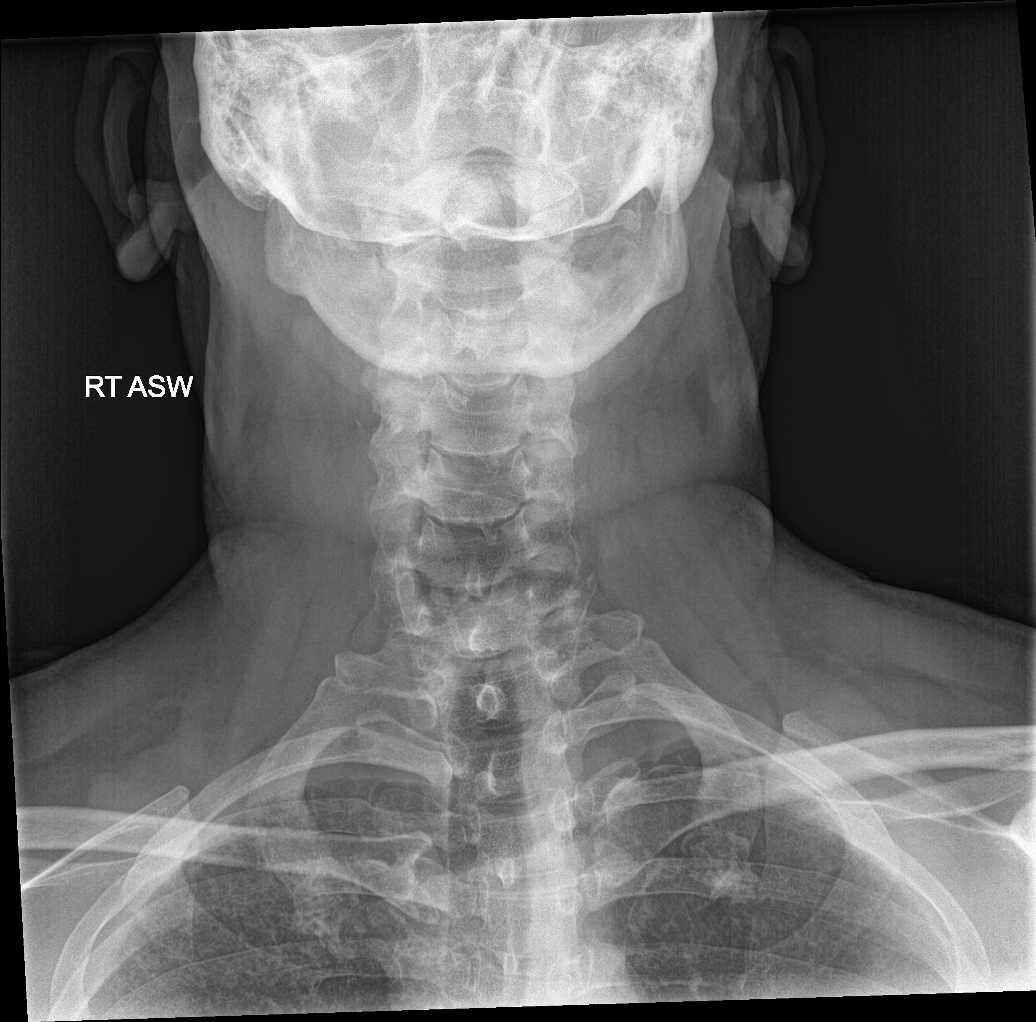

[c-spine open mouth]
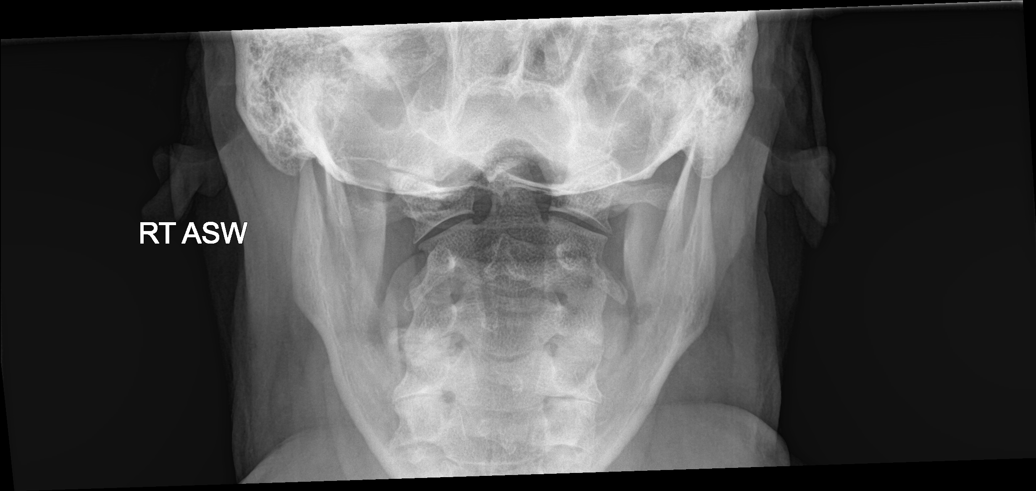

[[person_name]]
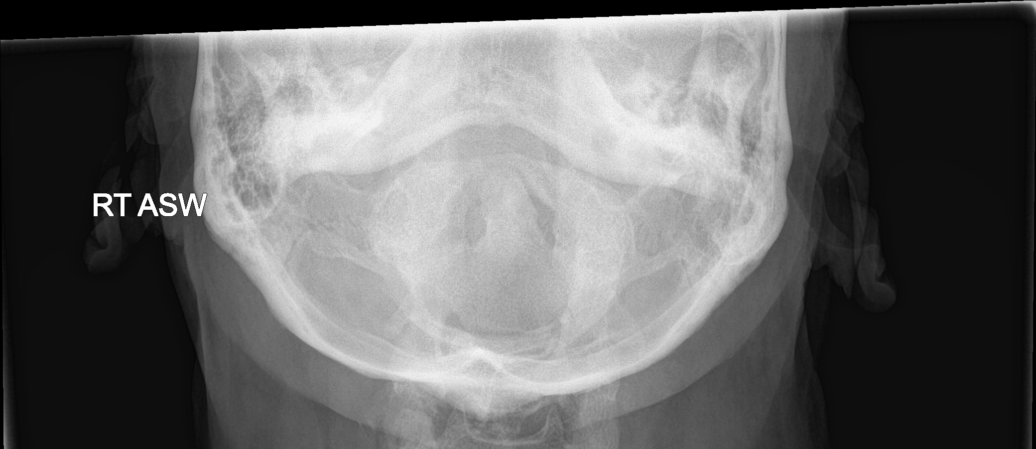

[ct-spine swimmers]
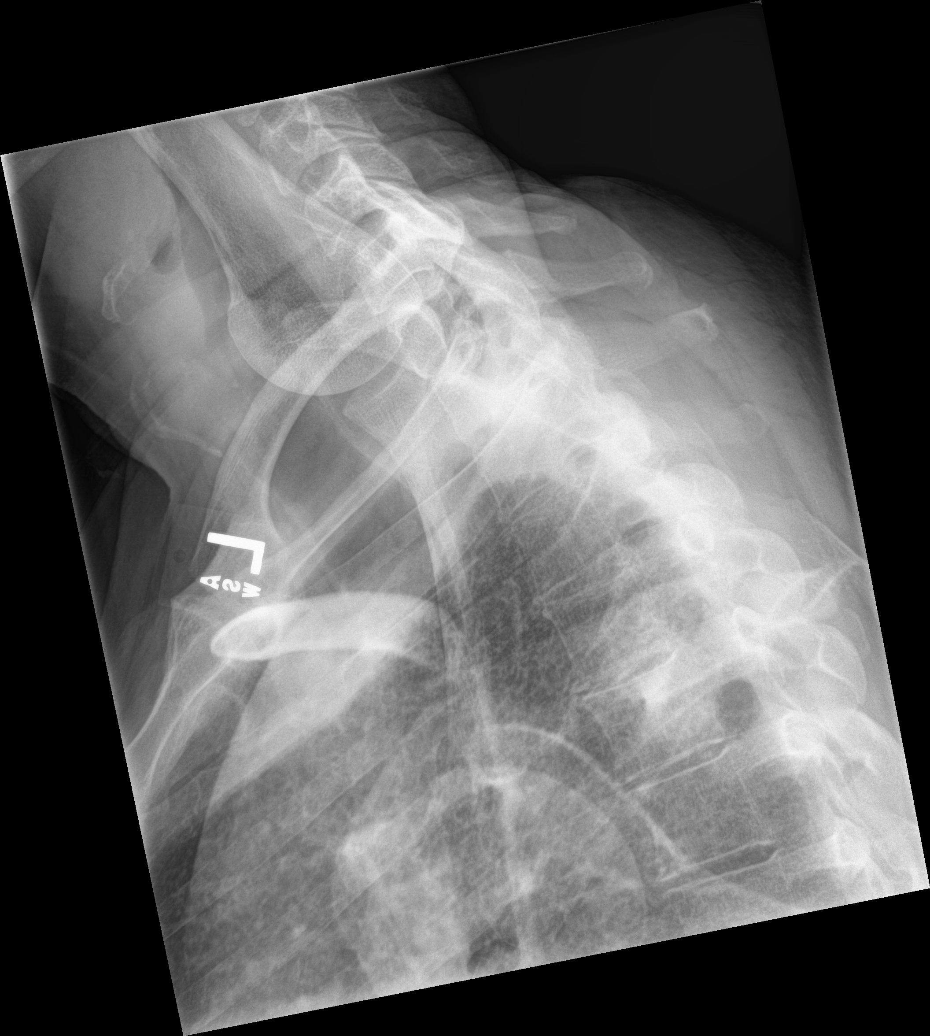

[c-spine lat]
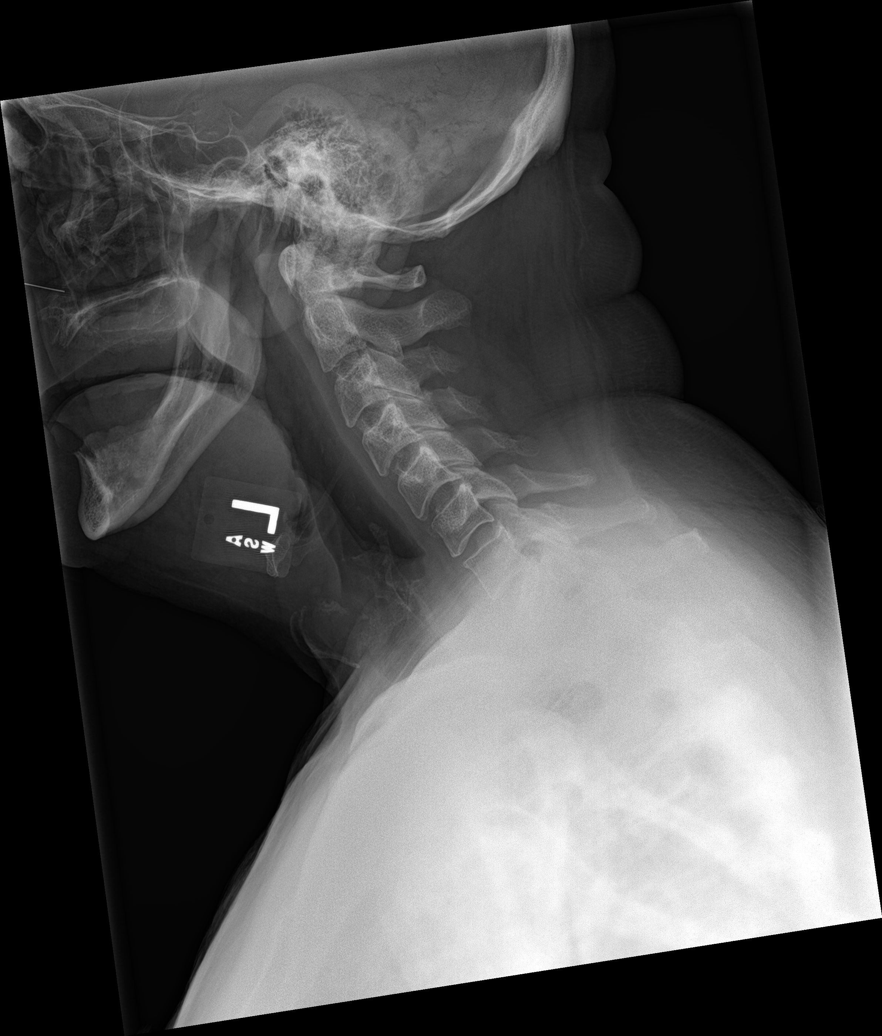

[7 of 7 positions shown; findings below may reference images not displayed]

FINDINGS: There is no evidence of cervical spine fracture or prevertebral soft
tissue swelling. Alignment is normal. No other significant bone
abnormalities are identified.
IMPRESSION: Negative cervical spine radiographs.

## 2020-10-01 IMAGING — CR DG LUMBAR SPINE COMPLETE 4+V
5 series · 5 of 5 positions shown · non-contrast
Comparison: [DATE].

CLINICAL DATA: Low back pain after fall today.

EXAM:
LUMBAR SPINE - COMPLETE 4+ VIEW

[l-spine ap]
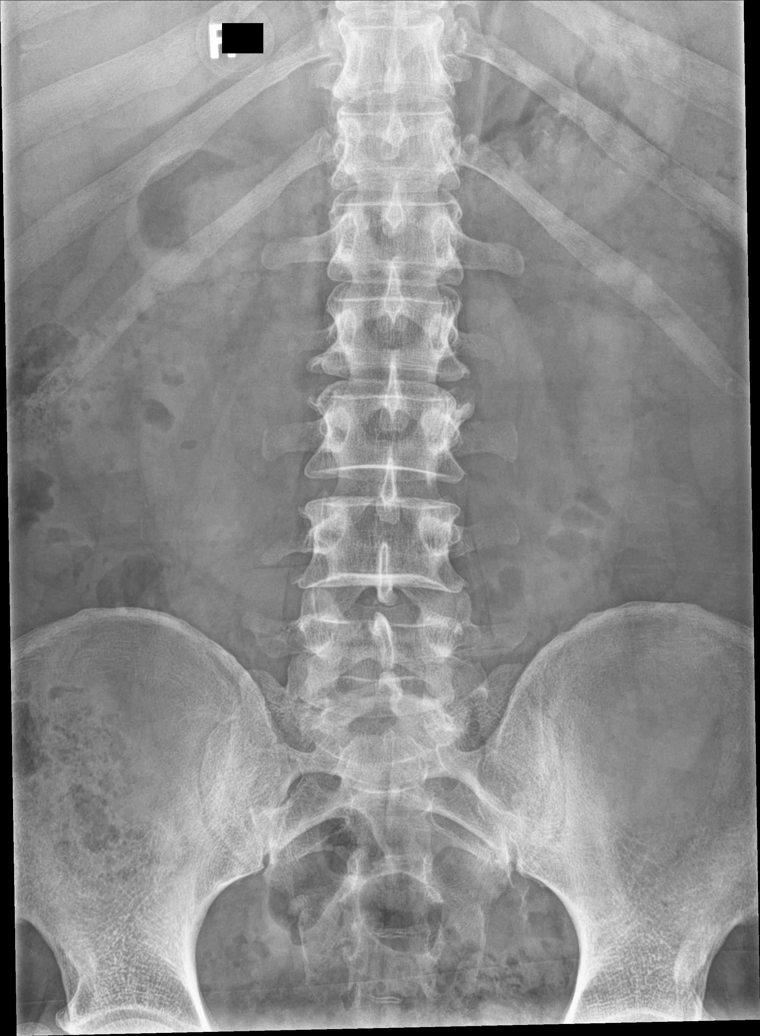

[l-spine obl (1 of 2)]
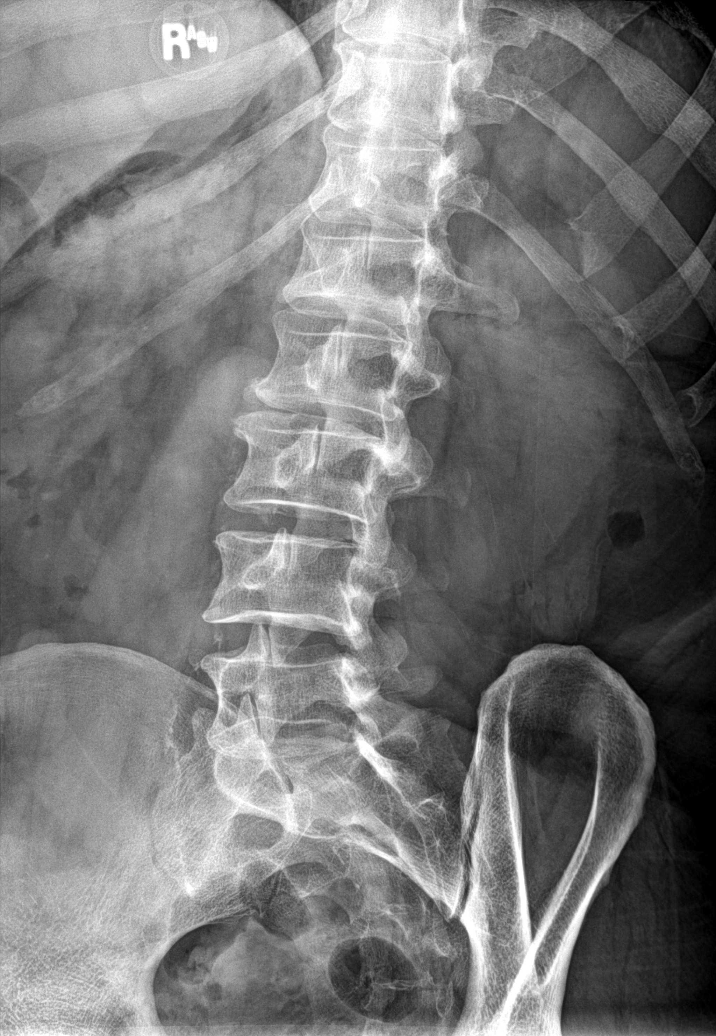

[l-spine obl (2 of 2)]
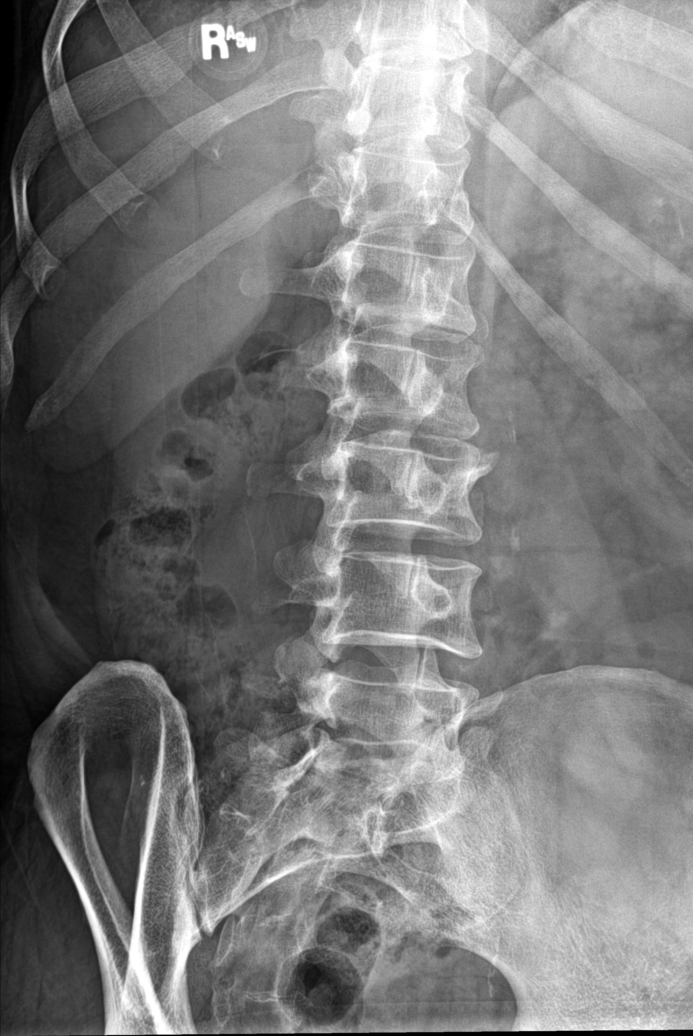

[l-spine lat]
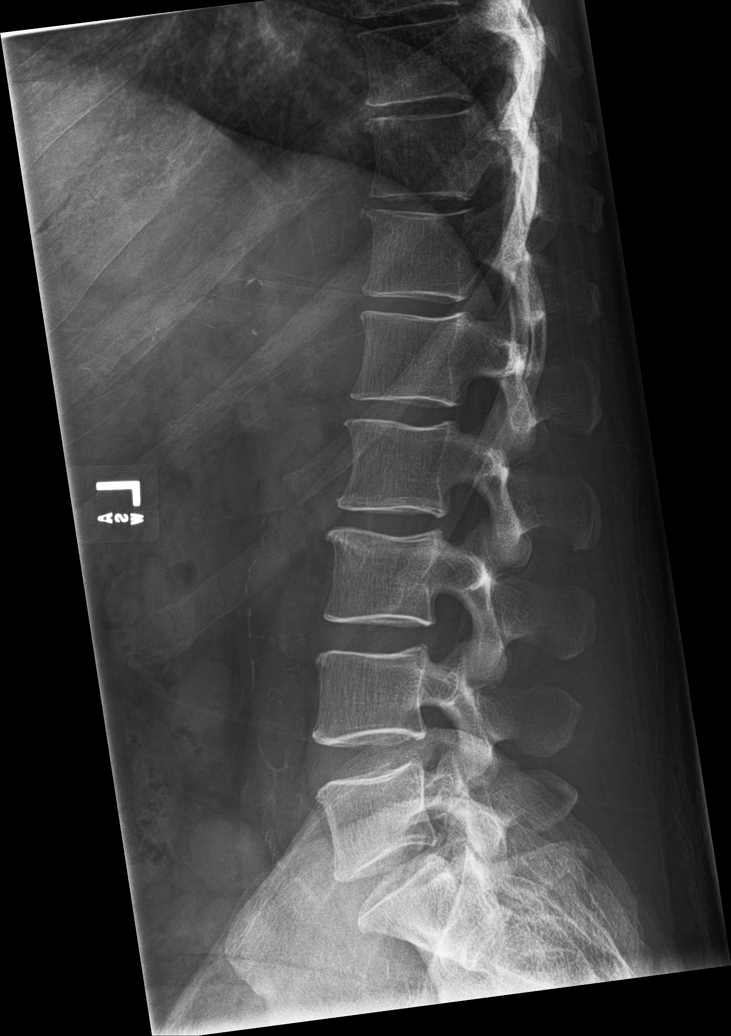

[l-spine spot]
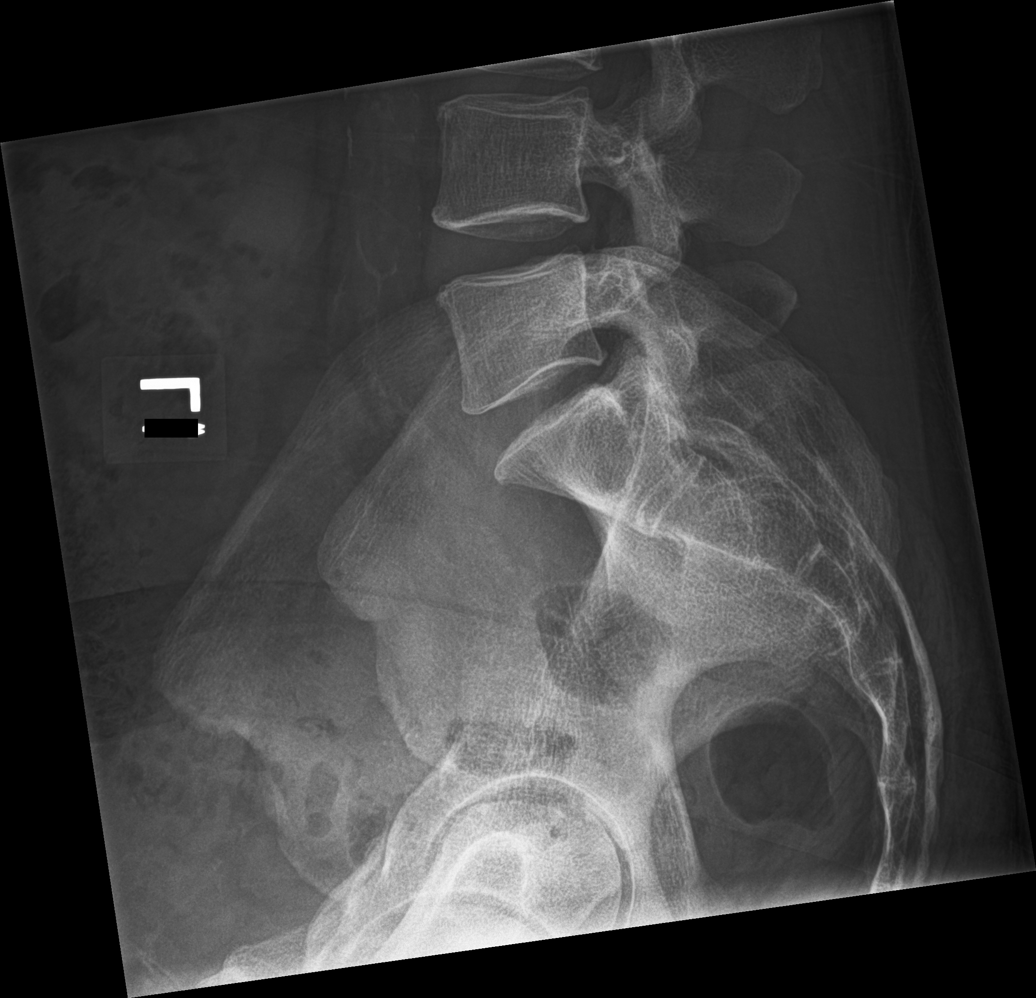

[5 of 5 positions shown; findings below may reference images not displayed]

FINDINGS: There is no evidence of lumbar spine fracture. Alignment is normal.
Mild degenerative disc disease is noted at L2-3 with anterior
osteophyte formation.
IMPRESSION: Mild degenerative disc disease is noted at L2-3. No acute
abnormality is noted

## 2020-10-01 MED ORDER — TIZANIDINE HCL 4 MG PO TABS: 4.0000 mg | ORAL_TABLET | Freq: Three times a day (TID) | ORAL | 0 refills | Status: DC | PRN

## 2020-10-01 MED ORDER — MELOXICAM 15 MG PO TABS: 15.0000 mg | ORAL_TABLET | Freq: Every day | ORAL | 0 refills | Status: DC | PRN

## 2020-10-01 NOTE — ED Provider Notes (Signed)
[facility]    CSN: [id number] Arrival date & time: 10/01/20  1508      History   Chief Complaint Chief Complaint  Patient presents with   Back Pain   HPI  55 year old male presents with back pain and neck pain after suffering a fall.  Patient states that he was walking out of his garage to the yard and subsequently suffered a fall.  He states that he lost balance.  He endorses poor balance and severe neuropathy which makes him fall frequently.  He reports that he landed on his side.  He reports bilateral low back pain as well as some posterior neck pain.  No relieving factors.  His pain is moderate to severe.  No saddle anesthesia or incontinence.  No other reported symptoms.  No other complaints or concerns at this time.  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   Calculus of kidney 02/04/2015   Perianal abscess 02/04/2015   Perirectal abscess    Chronic obstructive pulmonary disease ([place]) 01/17/2013   Acid reflux 01/17/2013   HLD (hyperlipidemia) 01/17/2013   Apnea, sleep 01/17/2013   Testicular hypofunction 01/17/2013   Allergic state 01/17/2013   Adiposity 07/06/2011    Past Surgical History:  Procedure Laterality Date   APPENDECTOMY     INCISION AND DRAINAGE PERIRECTAL ABSCESS N/A 02/04/2015   Procedure: IRRIGATION AND DEBRIDEMENT PERIRECTAL ABSCESS;  Surgeon: Marlyce Huge, MD;  Location: [facility];  Service: General;  Laterality: N/A;   ORIF ANKLE FRACTURE Left 03/23/2017   Procedure: OPEN REDUCTION INTERNAL FIXATION (ORIF) ANKLE FRACTURE;  Surgeon: Leim Fabry, MD;  Location: [facility];  Service: Orthopedics;  Laterality:  Left;   RECTAL EXAM UNDER ANESTHESIA  02/04/2015   Procedure: RECTAL EXAM UNDER ANESTHESIA;  Surgeon: Marlyce Huge, MD;  Location: [facility];  Service: General;;   SYNDESMOSIS REPAIR Left 03/23/2017   Procedure: SYNDESMOSIS REPAIR;  Surgeon: Leim Fabry, MD;  Location: [facility];  Service: Orthopedics;  Laterality: Left;       Home Medications    Prior to Admission medications   Medication Sig Start Date End Date Taking? Authorizing Provider  meloxicam (MOBIC) 15 MG tablet Take 1 tablet (15 mg total) by mouth daily as needed for pain. 10/01/20  Yes Shambria Camerer G, DO  tiZANidine (ZANAFLEX) 4 MG tablet Take 1 tablet (4 mg total) by mouth every 8 (eight) hours as needed for muscle spasms. 10/01/20  Yes Mylik Pro G, DO  acetaminophen (TYLENOL) 500 MG tablet Take 2 tablets (1,000 mg total) by mouth every 8 (eight) hours. 03/24/17   Reche Dixon, PA-C  aspirin EC 325 MG EC tablet Take 1 tablet (325 mg total) by mouth daily. 03/24/17   Reche Dixon, PA-C  atorvastatin (LIPITOR) 40 MG tablet Take 40 mg by mouth at bedtime. 09/14/19   [provider]  budesonide-formoterol (SYMBICORT) 80-4.5 MCG/ACT inhaler Inhale 2 puffs into the lungs 2 (two) times daily.    [provider]  citalopram (CELEXA) 20 MG tablet Take 20 mg by mouth daily. 09/27/20   [provider]  gabapentin (NEURONTIN) 300 MG capsule Take 300 mg by mouth  3 (three) times daily. 01/07/20   [provider]  gabapentin (NEURONTIN) 600 MG tablet Take 600 mg by mouth 3 (three) times daily. 09/23/20   [provider]  hydrochlorothiazide (HYDRODIURIL) 25 MG tablet Take 1 tablet by mouth daily. 02/26/17   [provider]  hydrOXYzine (ATARAX/VISTARIL) 25 MG tablet Take 1 tablet by mouth 3 (three) times daily as needed for anxiety. 01/08/20   [provider]  insulin isophane & regular human (HUMULIN 70/30 KWIKPEN) (70-30) 100 UNIT/ML KwikPen Inject 40 Units into the skin 2 (two)  times daily with a meal. 10/14/19 10/13/20  [provider]  insulin lispro (HUMALOG) 100 UNIT/ML injection Check your blood glucose (BG) 3 times daily with meals and administer the following amount subcutaneously per scale below: BG < 120: 0 units BG 121 - 150: 3 units BG 151 - 200: 4 units BG 201 - 250: 7 units BG 251 - 300: 11 units BG 301 - 350: 15 units BG 351 - 400: 20 units BG > 400: call your doctor or go to the ER 12/08/19 12/07/20  Alfred Levins, [place], MD  JANUVIA 50 MG tablet Take 1 tablet by mouth daily. 02/26/17   [provider]  lisinopril (PRINIVIL,ZESTRIL) 10 MG tablet Take 10 mg by mouth daily.    [provider]  lisinopril (PRINIVIL,ZESTRIL) 30 MG tablet Take 1 tablet (30 mg total) by mouth daily. Patient not taking: Reported on 03/23/2017 05/05/16 05/05/17  Gregor Hams, MD  lisinopril (PRINIVIL,ZESTRIL) 40 MG tablet Take 1 tablet by mouth daily. 02/26/17   [provider]  metFORMIN (GLUCOPHAGE-XR) 500 MG 24 hr tablet Take 4 tablets by mouth daily. 02/26/17   [provider]  nicotine (NICODERM CQ - DOSED IN MG/24 HOURS) 21 mg/24hr patch Place 1 patch onto the skin daily. 06/01/14   [provider]  omeprazole (PRILOSEC OTC) 20 MG tablet Take 20 mg by mouth 2 (two) times daily.    [provider]  pantoprazole (PROTONIX) 40 MG tablet Take 2 tablets by mouth daily. 03/03/17   [provider]  Testosterone Cypionate 200 MG/ML KIT Inject 200 mg into the muscle every 14 (fourteen) days.    [provider]  tiotropium (SPIRIVA) 18 MCG inhalation capsule Place 18 mcg into inhaler and inhale daily.    [provider]  TRELEGY ELLIPTA 100-62.5-25 MCG/INH AEPB Inhale 1 puff into the lungs daily. 12/29/19   [provider]  VENTOLIN HFA 108 (90 Base) MCG/ACT inhaler Inhale 2 puffs into the lungs every 6 (six) hours as needed. 03/22/17   [provider]    Family History Family  History  Problem Relation Age of Onset   Cancer Mother 5       Lung   Cancer Father        Colon   Heart disease Father    Alcohol abuse Father    Cancer Brother 83       Esophageal   Diabetes Brother    Heart disease Brother     Social History Social History   Tobacco Use   Smoking status: Former    Packs/day: 0.50    Years: 34.00    Pack years: 17.00    Types: Cigarettes    Quit date: 06/15/2019    Years since quitting: 1.2   Smokeless tobacco: Never   Tobacco comments:    patient using Nicoderm patches  Vaping Use   Vaping Use: Never used  Substance Use Topics   Alcohol  use: Yes    Alcohol/week: 12.0 standard drinks    Types: 12 Cans of beer per week    Comment: varies- only drinks on weekend. 01/26/20 3 beers per weekend   Drug use: No     Allergies   Glipizide and Duloxetine   Review of Systems Review of Systems  Constitutional: Negative.   Musculoskeletal:  Positive for back pain and neck pain.    Physical Exam Triage Vital Signs ED Triage Vitals  Enc Vitals Group     BP 10/01/20 1523 128/82     Pulse Rate 10/01/20 1523 98     Resp 10/01/20 1523 18     Temp 10/01/20 1523 98.5 F (36.9 C)     Temp Source 10/01/20 1523 Oral     SpO2 10/01/20 1523 96 %     Weight --      Height --      Head Circumference --      Peak Flow --      Pain Score 10/01/20 1524 8     Pain Loc --      Pain Edu? --      Excl. in [place]? --    No data found.  Updated Vital Signs BP 128/82 (BP Location: Left Arm)   Pulse 98   Temp 98.5 F (36.9 C) (Oral)   Resp 18   SpO2 96%   Visual Acuity Right Eye Distance:   Left Eye Distance:   Bilateral Distance:    Right Eye Near:   Left Eye Near:    Bilateral Near:     Physical Exam Vitals and nursing note reviewed.  Constitutional:      General: He is not in acute distress.    Appearance: Normal appearance. He is not ill-appearing.  HENT:     Head: Normocephalic and atraumatic.  Eyes:     General:         Right eye: No discharge.        Left eye: No discharge.     Conjunctiva/sclera: Conjunctivae normal.  Cardiovascular:     Rate and Rhythm: Normal rate and regular rhythm.  Pulmonary:     Effort: Pulmonary effort is normal.     Breath sounds: Normal breath sounds. No wheezing or rales.  Musculoskeletal:     Comments: Cervical spine -no significant tenderness on exam.  Lumbar spine -paraspinal musculature with spasm.  Tender to palpation bilaterally.  Neurological:     Mental Status: He is alert.  Psychiatric:        Mood and Affect: Mood normal.        Behavior: Behavior normal.     UC Treatments / Results  Labs (all labs ordered are listed, but only abnormal results are displayed) Labs Reviewed - No data to display  EKG   Radiology DG Cervical Spine Complete  Result Date: 10/01/2020 CLINICAL DATA:  Neck pain after fall. EXAM: CERVICAL SPINE - COMPLETE 4+ VIEW COMPARISON:  None. FINDINGS: There is no evidence of cervical spine fracture or prevertebral soft tissue swelling. Alignment is normal. No other significant bone abnormalities are identified. IMPRESSION: Negative cervical spine radiographs. Electronically Signed   By: Marijo Conception M.D.   On: 10/01/2020 16:42   DG Lumbar Spine Complete  Result Date: 10/01/2020 CLINICAL DATA:  Low back pain after fall today. EXAM: LUMBAR SPINE - COMPLETE 4+ VIEW COMPARISON:  January 26, 2020. FINDINGS: There is no evidence of lumbar spine fracture. Alignment is normal. Mild degenerative disc disease  is noted at L2-3 with anterior osteophyte formation. IMPRESSION: Mild degenerative disc disease is noted at L2-3. No acute abnormality is noted Electronically Signed   By: Marijo Conception M.D.   On: 10/01/2020 16:40    Procedures Procedures (including critical care time)  Medications Ordered in UC Medications - No data to display  Initial Impression / Assessment and Plan / UC Course  I have reviewed the triage vital signs and the nursing  notes.  Pertinent labs & imaging results that were available during my care of the patient were reviewed by me and considered in my medical decision making (see chart for details).    55 year old male presents with lumbar strain and cervical strain.  X-rays were obtained given the fact that he fell.  X-rays were independently reviewed by me.  Interpretation: Cervical spine x-rays were negative.  Mild degenerative changes of the lumbar spine.  Advised rest and heat.  Zanaflex and meloxicam as directed.  Final Clinical Impressions(s) / UC Diagnoses   Final diagnoses:  Strain of lumbar region, initial encounter  Strain of neck muscle, initial encounter     Discharge Instructions      Rest.  Heat.  Medication as directed.  Take care  Dr. Lacinda Axon    ED Prescriptions     Medication Sig Dispense Auth. Provider   tiZANidine (ZANAFLEX) 4 MG tablet Take 1 tablet (4 mg total) by mouth every 8 (eight) hours as needed for muscle spasms. 30 tablet Jigar Zielke G, DO   meloxicam (MOBIC) 15 MG tablet Take 1 tablet (15 mg total) by mouth daily as needed for pain. 30 tablet Coral Spikes, DO      PDMP not reviewed this encounter.   Coral Spikes, [place] 10/01/20 1843

## 2020-10-01 NOTE — Discharge Instructions (Addendum)
Rest.  Heat.  Medication as directed.  Take care  Dr. Lacinda Axon

## 2020-10-01 NOTE — ED Triage Notes (Signed)
Pt had a fall today at home while walking from his garage to outdoors. States he is having lower back pain and neck pain.

## 2020-10-08 ENCOUNTER — Other Ambulatory Visit: Payer: Self-pay | Admitting: Family Medicine

## 2020-10-15 ENCOUNTER — Other Ambulatory Visit
Admission: RE | Admit: 2020-10-15 | Discharge: 2020-10-15 | Disposition: A | Discharge status: Home / Self Care | Payer: Medicaid Other | Attending: Urology | Admitting: Urology

## 2020-10-15 ENCOUNTER — Encounter: Payer: Self-pay | Admitting: Urology

## 2020-10-15 ENCOUNTER — Ambulatory Visit (INDEPENDENT_AMBULATORY_CARE_PROVIDER_SITE_OTHER): Payer: Medicaid Other | Admitting: Urology

## 2020-10-15 ENCOUNTER — Other Ambulatory Visit: Payer: Self-pay

## 2020-10-15 VITALS — BP 148/74 | HR 96 | Ht 74.0 in | Wt 249.0 lb

## 2020-10-15 DIAGNOSIS — E291 Testicular hypofunction: Secondary | ICD-10-CM

## 2020-10-15 DIAGNOSIS — N529 Male erectile dysfunction, unspecified: Secondary | ICD-10-CM | POA: Diagnosis not present

## 2020-10-15 LAB — HEMOGLOBIN AND HEMATOCRIT, BLOOD
HCT: 40.4 % (ref 39.0–52.0)
Hemoglobin: 13.3 g/dL (ref 13.0–17.0)

## 2020-10-15 LAB — PSA: Prostatic Specific Antigen: 1.71 ng/mL (ref 0.00–4.00)

## 2020-10-15 MED ORDER — TADALAFIL 5 MG PO TABS: 2.5000 mg | ORAL_TABLET | Freq: Every day | ORAL | 11 refills | Status: DC | PRN

## 2020-10-15 MED ORDER — "BD BLUNT FILL NEEDLE 18G X 1-1/2"" MISC": 0 refills | Status: DC

## 2020-10-15 MED ORDER — TESTOSTERONE CYPIONATE 200 MG/ML IM SOLN: 200.0000 mg | INTRAMUSCULAR | 2 refills | Status: DC

## 2020-10-15 MED ORDER — "BD ECLIPSE SYRINGE 21G X 1"" 3 ML MISC": 0 refills | Status: DC

## 2020-10-15 NOTE — Progress Notes (Signed)
10/15/20 10:13 AM   Darren Allen 1965/06/19 [id number]  CC: Low testosterone, ED, PSA screening  HPI: 55 year old male with a number of comorbidities who has been on long-term testosterone replacement for 9 to 10 years that have been managed at [facility].  He is changing his care locally here to [place] because commuting is an issue.  Last testosterone value was 362 in August 2021, and PSA was also normal at that time at 1.75.  He denies any significant urinary symptoms.  He has some trouble maintaining erections, and previously took sildenafil but had bothersome side effects of headache with this medication.  He is interested in potentially trying a new ED medication.  He denies any gross hematuria.  No family history of prostate cancer.  His last testosterone injection was 2 weeks ago.  He feels this improves his mood and energy significantly.   PMH: Past Medical History:  Diagnosis Date   Calculus of kidney 02/04/2015   COPD (chronic obstructive pulmonary disease) ([facility])    Diabetes mellitus without complication ([place])    type 2   Hypertension    Neuropathy     Surgical History: Past Surgical History:  Procedure Laterality Date   APPENDECTOMY     INCISION AND DRAINAGE PERIRECTAL ABSCESS N/A 02/04/2015   Procedure: IRRIGATION AND DEBRIDEMENT PERIRECTAL ABSCESS;  Surgeon: Marlyce Huge, MD;  Location: [facility];  Service: General;  Laterality: N/A;   ORIF ANKLE FRACTURE Left 03/23/2017   Procedure: OPEN REDUCTION INTERNAL FIXATION (ORIF) ANKLE FRACTURE;  Surgeon: Leim Fabry, MD;  Location: [facility];  Service: Orthopedics;  Laterality: Left;   RECTAL EXAM UNDER ANESTHESIA  02/04/2015   Procedure: RECTAL EXAM UNDER ANESTHESIA;  Surgeon: Marlyce Huge, MD;  Location: [facility];  Service: General;;   SYNDESMOSIS REPAIR Left 03/23/2017   Procedure: SYNDESMOSIS REPAIR;  Surgeon: Leim Fabry, MD;  Location: [facility];  Service: Orthopedics;  Laterality: Left;   Family  History: Family History  Problem Relation Age of Onset   Cancer Mother 59       Lung   Cancer Father        Colon   Heart disease Father    Alcohol abuse Father    Cancer Brother 64       Esophageal   Diabetes Brother    Heart disease Brother     Social History:  reports that he quit smoking about 16 months ago. His smoking use included cigarettes. He has a 17.00 pack-year smoking history. He has never used smokeless tobacco. He reports current alcohol use of about 12.0 standard drinks of alcohol per week. He reports that he does not use drugs.  Physical Exam: BP (!) 148/74   Pulse 96   Ht '6\' 2"'$  (1.88 m)   Wt 249 lb (112.9 kg)   BMI 31.97 kg/m    Constitutional:  Alert and oriented, No acute distress. Cardiovascular: No clubbing, cyanosis, or edema. Respiratory: Normal respiratory effort, no increased work of breathing. GI: Abdomen is soft, nontender, nondistended, no abdominal masses   Laboratory Data: Reviewed, see HPI  Assessment & Plan:   55 year old male on long-term testosterone replacement therapy by outside urologist, as well as ED interested in trying new PDE 5 inhibitor.  We reviewed the AUA guidelines regarding testosterone evaluation and management, and I recommended repeating a testosterone, PSA, and H/H today.  In terms of ED we discussed the PDE 5 inhibitors, and I recommended a trial of low-dose Cialis as needed 2.5 to 5 mg, and  risks and benefits were discussed.  Testosterone, PSA, H/H today-call with results Testosterone refilled Cialis 2.5 to 5 mg as needed for ED RTC 1 year with PA with above labs  Nickolas Madrid, MD 10/15/2020  [place] [address], [place], [place] [address] [phone]

## 2020-10-16 LAB — TESTOSTERONE: Testosterone: 227 ng/dL — ABNORMAL LOW (ref 264–916)

## 2020-10-17 ENCOUNTER — Telehealth: Payer: Self-pay

## 2020-10-17 NOTE — Telephone Encounter (Signed)
Left pet mess to call, no DPR on file. My chart notification also sent

## 2020-10-17 NOTE — Telephone Encounter (Signed)
-----   Message from Billey Co, MD sent at 10/17/2020 10:52 AM EDT ----- Testosterone low at 227, resume injections from outside provider for testosterone replacement  PSA was normal at 1.7

## 2020-12-12 ENCOUNTER — Other Ambulatory Visit: Payer: Self-pay

## 2020-12-12 ENCOUNTER — Emergency Department: Payer: Medicaid Other

## 2020-12-12 ENCOUNTER — Ambulatory Visit
Admission: EM | Admit: 2020-12-12 | Discharge: 2020-12-12 | Payer: Medicaid Other | Attending: Emergency Medicine | Admitting: Emergency Medicine

## 2020-12-12 ENCOUNTER — Emergency Department
Admission: EM | Admit: 2020-12-12 | Discharge: 2020-12-12 | Disposition: A | Discharge status: Home / Self Care | Payer: Medicaid Other | Attending: Emergency Medicine | Admitting: Emergency Medicine

## 2020-12-12 DIAGNOSIS — I10 Essential (primary) hypertension: Secondary | ICD-10-CM | POA: Diagnosis not present

## 2020-12-12 DIAGNOSIS — Z794 Long term (current) use of insulin: Secondary | ICD-10-CM | POA: Diagnosis not present

## 2020-12-12 DIAGNOSIS — Z87891 Personal history of nicotine dependence: Secondary | ICD-10-CM | POA: Insufficient documentation

## 2020-12-12 DIAGNOSIS — E119 Type 2 diabetes mellitus without complications: Secondary | ICD-10-CM | POA: Diagnosis not present

## 2020-12-12 DIAGNOSIS — Z79899 Other long term (current) drug therapy: Secondary | ICD-10-CM | POA: Diagnosis not present

## 2020-12-12 DIAGNOSIS — R079 Chest pain, unspecified: Secondary | ICD-10-CM | POA: Diagnosis present

## 2020-12-12 DIAGNOSIS — Z7984 Long term (current) use of oral hypoglycemic drugs: Secondary | ICD-10-CM | POA: Insufficient documentation

## 2020-12-12 DIAGNOSIS — Z720 Tobacco use: Secondary | ICD-10-CM

## 2020-12-12 DIAGNOSIS — J449 Chronic obstructive pulmonary disease, unspecified: Secondary | ICD-10-CM | POA: Diagnosis not present

## 2020-12-12 DIAGNOSIS — Z7951 Long term (current) use of inhaled steroids: Secondary | ICD-10-CM | POA: Diagnosis not present

## 2020-12-12 LAB — COMPREHENSIVE METABOLIC PANEL
ALT: 35 U/L (ref 0–44)
AST: 29 U/L (ref 15–41)
Albumin: 4.4 g/dL (ref 3.5–5.0)
Alkaline Phosphatase: 54 U/L (ref 38–126)
Anion gap: 12 (ref 5–15)
BUN: 22 mg/dL — ABNORMAL HIGH (ref 6–20)
CO2: 24 mmol/L (ref 22–32)
Calcium: 9.7 mg/dL (ref 8.9–10.3)
Chloride: 102 mmol/L (ref 98–111)
Creatinine, Ser: 1.35 mg/dL — ABNORMAL HIGH (ref 0.61–1.24)
GFR, Estimated: >60 mL/min (ref >60)
Glucose, Bld: 110 mg/dL — ABNORMAL HIGH (ref 70–99)
Potassium: 4 mmol/L (ref 3.5–5.1)
Sodium: 138 mmol/L (ref 135–145)
Total Bilirubin: 0.8 mg/dL (ref 0.3–1.2)
Total Protein: 7.7 g/dL (ref 6.5–8.1)

## 2020-12-12 LAB — CBC WITH DIFFERENTIAL/PLATELET
Abs Immature Granulocytes: 0.03 10*3/uL (ref 0.00–0.07)
Basophils Absolute: 0.1 10*3/uL (ref 0.0–0.1)
Basophils Relative: 1 %
Eosinophils Absolute: 0.5 10*3/uL (ref 0.0–0.5)
Eosinophils Relative: 6 %
HCT: 42.4 % (ref 39.0–52.0)
Hemoglobin: 14.8 g/dL (ref 13.0–17.0)
Immature Granulocytes: 0 %
Lymphocytes Relative: 22 %
Lymphs Abs: 1.7 10*3/uL (ref 0.7–4.0)
MCH: 30.2 pg (ref 26.0–34.0)
MCHC: 34.9 g/dL (ref 30.0–36.0)
MCV: 86.5 fL (ref 80.0–100.0)
Monocytes Absolute: 0.8 10*3/uL (ref 0.1–1.0)
Monocytes Relative: 10 %
Neutro Abs: 4.9 10*3/uL (ref 1.7–7.7)
Neutrophils Relative %: 61 %
Platelets: 206 10*3/uL (ref 150–400)
RBC: 4.9 MIL/uL (ref 4.22–5.81)
RDW: 13.5 % (ref 11.5–15.5)
WBC: 8 10*3/uL (ref 4.0–10.5)
nRBC: 0 % (ref 0.0–0.2)

## 2020-12-12 LAB — TROPONIN I (HIGH SENSITIVITY)
Troponin I (High Sensitivity): 4 ng/L (ref <18)
Troponin I (High Sensitivity): 4 ng/L (ref <18)

## 2020-12-12 IMAGING — CR DG CHEST 2V
1 series · 2 of 2 positions shown · non-contrast
Comparison: Multiple priors, most recent [DATE].

CLINICAL DATA: chest pain

EXAM:
CHEST - 2 VIEW

[Series 1: w chest pa · 0.14mm/px · 2 of 2 slices shown]
[im 1/2]
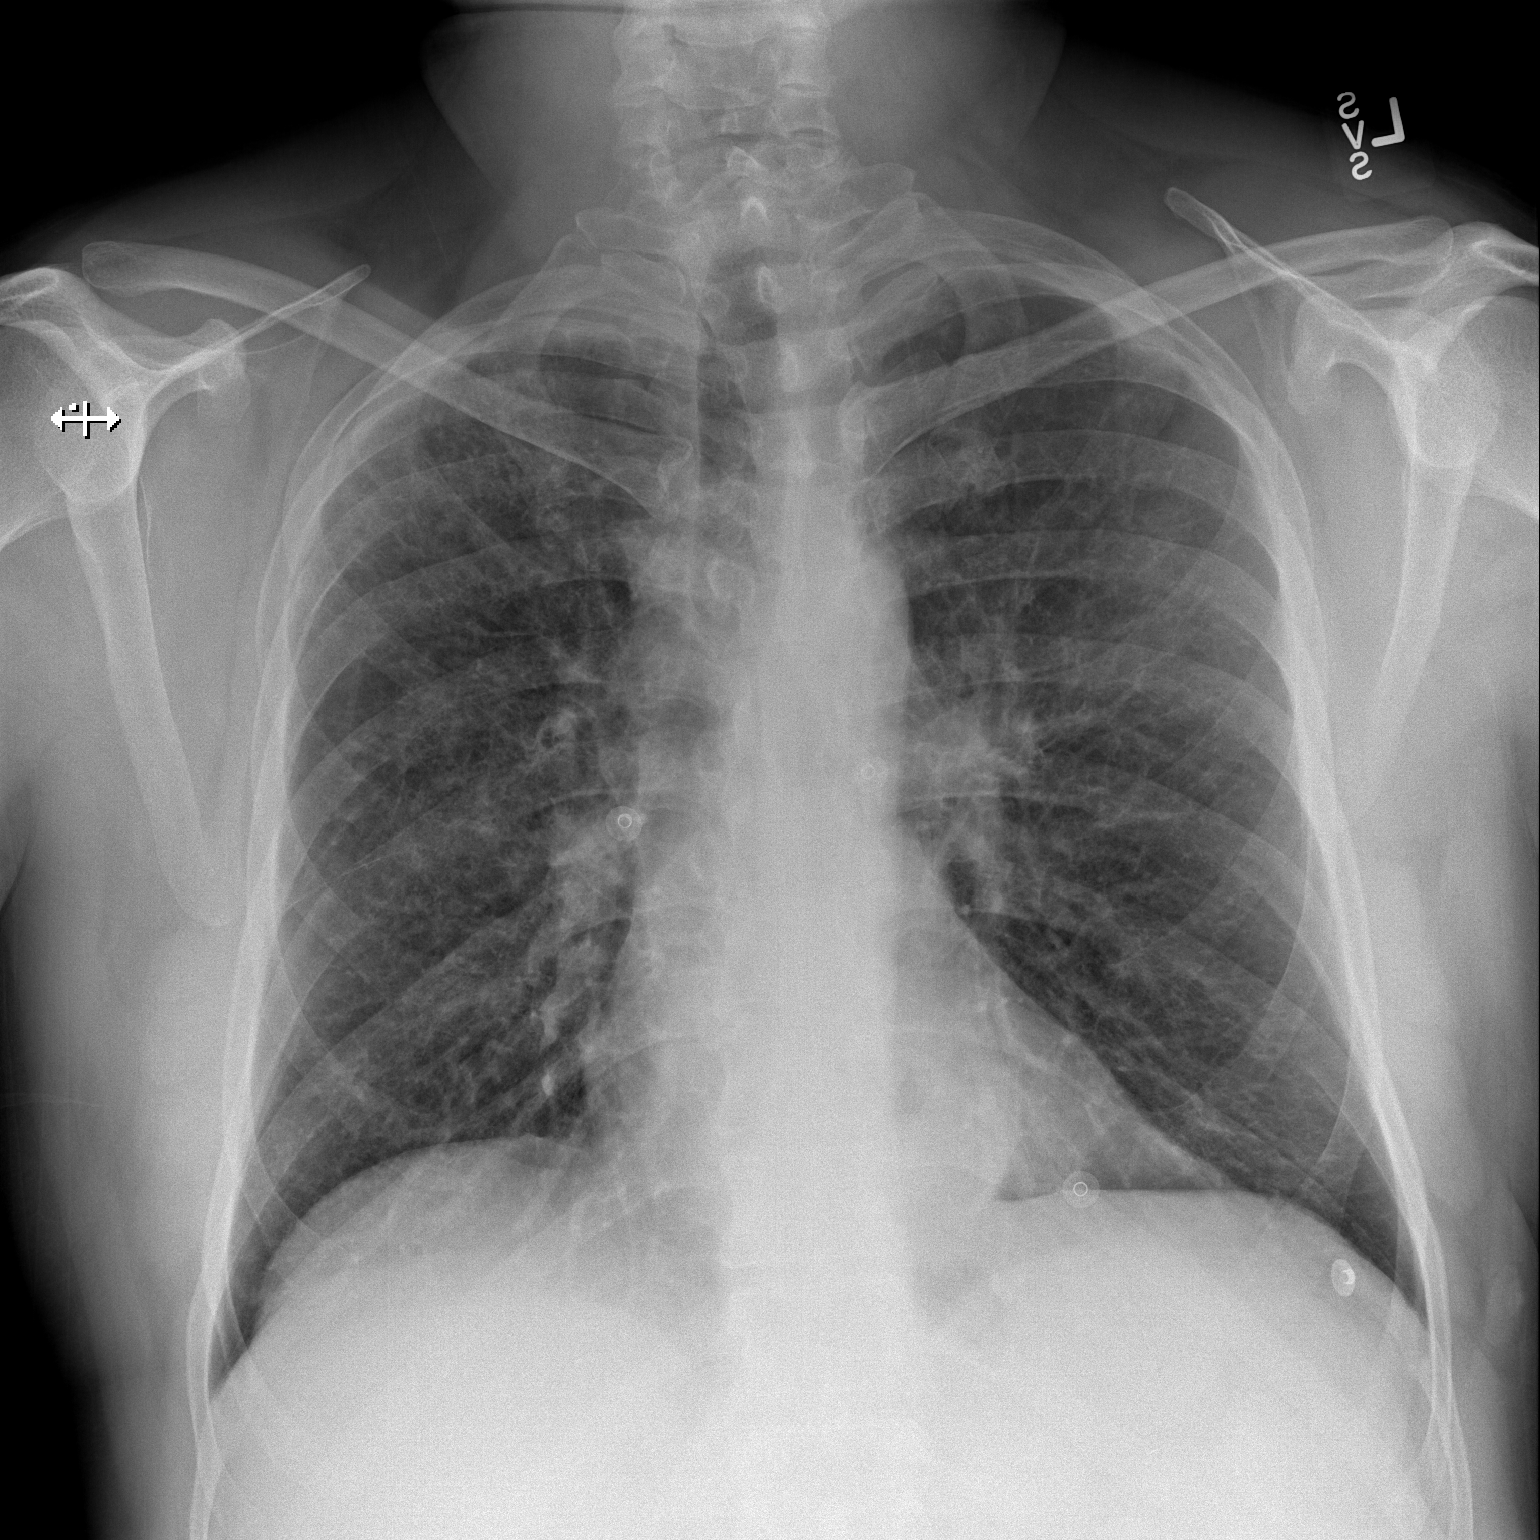
[im 2/2]
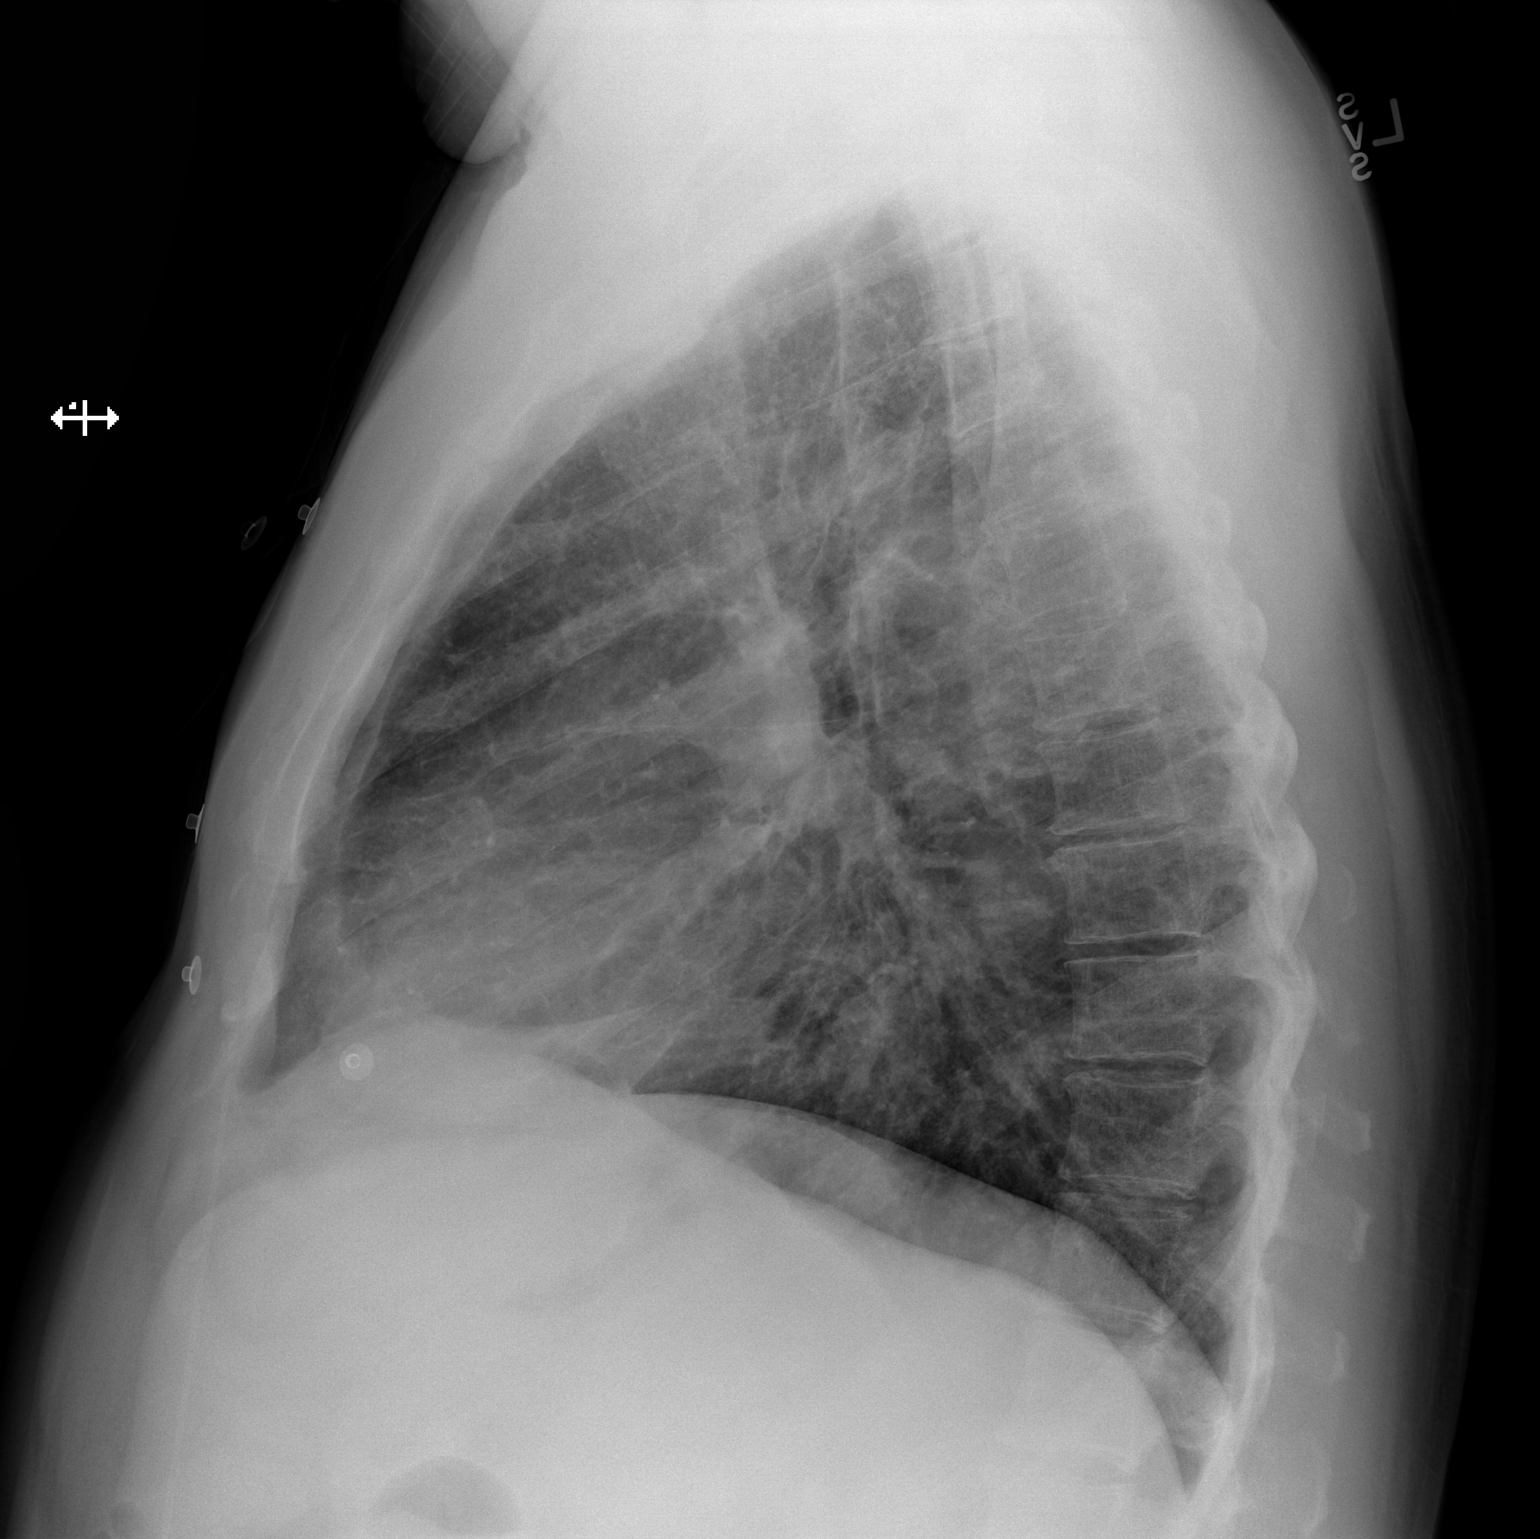

[2 of 2 positions shown; findings below may reference images not displayed]

FINDINGS: Mild chronic hyperexpansion. Chronic interstitial changes
bilaterally, which appears similar to multiple priors. No new
consolidation, visible pleural effusions or pneumothorax.
Cardiomediastinal silhouette is similar to prior and within normal
limits. No acute osseous abnormality.
IMPRESSION: 1. Chronic interstitial changes bilaterally, which appears similar
to multiple priors. No acute abnormality.
2. Mild chronic hyperexpansion, suggestive of emphysema.

## 2020-12-12 MED ORDER — ASPIRIN 81 MG PO CHEW
324.0000 mg | CHEWABLE_TABLET | Freq: Once | ORAL | Status: AC
  Administered 2020-12-12: 324 mg via ORAL

## 2020-12-12 NOTE — ED Provider Notes (Signed)
[facility]    CSN: [id number] Arrival date & time: 12/12/20  1147      History   Chief Complaint No chief complaint on file.   HPI Darren Allen is a 55 y.o. male.   HPI  55 year old male here for evaluation of chest pain.  Patient is here for left-sided chest pain with radiation to his left shoulder and down his left arm that is started approximately 11 AM this morning, or 1 hour ago, and is associated with lightheadedness and shortness of breath.  He is currently rating the pain as a 6/10.  Patient denies any sweating, radiation to his neck, or nausea.  Patient is currently being evaluated by Dr. Ubaldo Glassing of her [facility] clinic for left-sided chest pain that has been intermittent and coming and going for the past several months.  He was supposed to have a nuclear stress test and echocardiogram today but had to be rescheduled due to insurance issues.  Patient reports that he was watching TV when all of a sudden the pain in the left side of his chest came on and was more intense.  He describes it as an intense cramp in his left chest.  The pain is still present.  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   Calculus of kidney 02/04/2015   Perianal abscess 02/04/2015   Perirectal abscess    Chronic obstructive pulmonary disease ([place]) 01/17/2013   Acid reflux 01/17/2013   HLD (hyperlipidemia) 01/17/2013   Apnea, sleep 01/17/2013   Testicular hypofunction 01/17/2013   Allergic state 01/17/2013   Adiposity 07/06/2011    Past Surgical History:  Procedure Laterality Date   APPENDECTOMY     INCISION AND DRAINAGE PERIRECTAL ABSCESS N/A 02/04/2015    Procedure: IRRIGATION AND DEBRIDEMENT PERIRECTAL ABSCESS;  Surgeon: Marlyce Huge, MD;  Location: [facility] ORS;  Service: General;  Laterality: N/A;   ORIF ANKLE FRACTURE Left 03/23/2017   Procedure: OPEN REDUCTION INTERNAL FIXATION (ORIF) ANKLE FRACTURE;  Surgeon: Leim Fabry, MD;  Location: [facility] ORS;  Service: Orthopedics;  Laterality: Left;   RECTAL EXAM UNDER ANESTHESIA  02/04/2015   Procedure: RECTAL EXAM UNDER ANESTHESIA;  Surgeon: Marlyce Huge, MD;  Location: [facility] ORS;  Service: General;;   SYNDESMOSIS REPAIR Left 03/23/2017   Procedure: SYNDESMOSIS REPAIR;  Surgeon: Leim Fabry, MD;  Location: [facility] ORS;  Service: Orthopedics;  Laterality: Left;       Home Medications    Prior to Admission medications   Medication Sig Start Date End Date Taking? Authorizing Provider  amitriptyline (ELAVIL) 10 MG tablet Take by mouth. 10/12/20  Yes [provider]  gabapentin (NEURONTIN) 600 MG tablet  10/21/20  Yes [provider]  hydrochlorothiazide (HYDRODIURIL) 25 MG tablet Take 1 tablet by mouth daily. 02/26/17  Yes [provider]  HYDROcodone-acetaminophen (NORCO/VICODIN) 5-325 MG tablet Take 1 tablet by mouth 2 (two) times daily as needed. 11/20/20  Yes [provider]  hydrOXYzine (ATARAX/VISTARIL) 25 MG tablet Take 1 tablet by mouth 3 (three) times daily as needed for anxiety. 01/08/20  Yes [provider]  lisinopril (PRINIVIL,ZESTRIL) 40 MG tablet Take 1 tablet by mouth  daily. 02/26/17  Yes [provider]  metFORMIN (GLUCOPHAGE-XR) 500 MG 24 hr tablet Take 4 tablets by mouth daily. 02/26/17  Yes [provider]  pantoprazole (PROTONIX) 40 MG tablet Take 2 tablets by mouth daily. 03/03/17  Yes [provider]  rosuvastatin (CRESTOR) 40 MG tablet Take 40 mg by mouth daily. 09/30/20  Yes [provider]  testosterone cypionate (DEPOTESTOSTERONE CYPIONATE) 200 MG/ML injection Inject 1 mL (200 mg total)  into the muscle every 14 (fourteen) days. 10/15/20  Yes Billey Co, MD  tiZANidine (ZANAFLEX) 4 MG tablet Take 1 tablet (4 mg total) by mouth every 8 (eight) hours as needed for muscle spasms. 10/01/20  Yes Cook, Jayce G, DO  TRELEGY ELLIPTA 200-62.5-25 MCG/INH AEPB Inhale 1 puff into the lungs daily. 10/10/20  Yes [provider]  Accu-Chek Softclix Lancets lancets 3 (three) times daily. 09/30/20   [provider]  acetaminophen (TYLENOL) 500 MG tablet Take 2 tablets (1,000 mg total) by mouth every 8 (eight) hours. 03/24/17   Reche Dixon, PA-C  albuterol (PROVENTIL) (2.5 MG/3ML) 0.083% nebulizer solution Inhale into the lungs every 6 (six) hours as needed. 03/22/20 03/22/21  [provider]  ascorbic acid (VITAMIN C) 500 MG tablet Take by mouth daily.    [provider]  BD DISP NEEDLES 22G X 1-1/2" MISC every 14 (fourteen) days. 09/09/20   [provider]  citalopram (CELEXA) 20 MG tablet Take 20 mg by mouth daily. 09/27/20   [provider]  cyanocobalamin 1000 MCG tablet Take by mouth daily. 12/28/19 12/27/20  [provider]  ferrous sulfate 325 (65 FE) MG tablet Take by mouth daily.    [provider]  insulin isophane & regular human (HUMULIN 70/30 KWIKPEN) (70-30) 100 UNIT/ML KwikPen Inject 40 Units into the skin 2 (two) times daily with a meal. 10/14/19   [provider]  lidocaine-prilocaine (EMLA) cream Apply topically once. 07/25/20   [provider]  meloxicam (MOBIC) 15 MG tablet Take 1 tablet (15 mg total) by mouth daily as needed for pain. 10/01/20   Coral Spikes, DO  NEEDLE, DISP, 18 G (B-D BLUNT FILL NEEDLE) 18G X 1-1/2" MISC Use 18G needle to draw testosterone for injection 10/15/20   Billey Co, MD  nicotine (NICODERM CQ - DOSED IN MG/24 HOURS) 21 mg/24hr patch Place 1 patch onto the skin daily. 06/01/14   [provider]  SYRINGE-NEEDLE, DISP, 3 ML (BD ECLIPSE SYRINGE) 21G X 1" 3 ML MISC  Use to administer testosterone 10/15/20   Billey Co, MD  tadalafil (CIALIS) 5 MG tablet Take 0.5-1 tablets (2.5-5 mg total) by mouth daily as needed for erectile dysfunction. 10/15/20   Billey Co, MD    Family History Family History  Problem Relation Age of Onset   Cancer Mother 15       Lung   Cancer Father        Colon   Heart disease Father    Alcohol abuse Father    Cancer Brother 28       Esophageal   Diabetes Brother    Heart disease Brother     Social History Social History   Tobacco Use   Smoking status: Former    Packs/day: 0.50    Years: 34.00    Pack years: 17.00    Types: Cigarettes    Quit date: 06/15/2019    Years since quitting: 1.4   Smokeless tobacco: Never   Tobacco comments:  patient using Nicoderm patches  Vaping Use   Vaping Use: Never used  Substance Use Topics   Alcohol use: Yes    Alcohol/week: 12.0 standard drinks    Types: 12 Cans of beer per week    Comment: varies- only drinks on weekend. 01/26/20 3 beers per weekend   Drug use: No     Allergies   Glipizide and Duloxetine   Review of Systems Review of Systems  Constitutional:  Negative for diaphoresis.  Respiratory:  Positive for shortness of breath.   Cardiovascular:  Positive for chest pain. Negative for palpitations and leg swelling.  Gastrointestinal:  Negative for nausea.  Musculoskeletal:  Negative for neck pain.  Neurological:  Positive for light-headedness.    Physical Exam Triage Vital Signs ED Triage Vitals  Enc Vitals Group     BP 12/12/20 1152 134/90     Pulse Rate 12/12/20 1152 (!) 107     Resp 12/12/20 1152 18     Temp --      Temp Source 12/12/20 1152 Oral     SpO2 12/12/20 1152 99 %     Weight 12/12/20 1151 250 lb (113.4 kg)     Height 12/12/20 1151 6\' 2"  (1.88 m)     Head Circumference --      Peak Flow --      Pain Score 12/12/20 1151 6     Pain Loc --      Pain Edu? --      Excl. in [place]? --    No data found.  Updated Vital Signs BP  134/90 (BP Location: Left Arm)   Pulse (!) 107   Resp 18   Ht 6\' 2"  (1.88 m)   Wt 250 lb (113.4 kg)   SpO2 99%   BMI 32.10 kg/m   Visual Acuity Right Eye Distance:   Left Eye Distance:   Bilateral Distance:    Right Eye Near:   Left Eye Near:    Bilateral Near:     Physical Exam Vitals and nursing note reviewed.  Constitutional:      Appearance: Normal appearance.  HENT:     Head: Normocephalic and atraumatic.  Cardiovascular:     Rate and Rhythm: Regular rhythm. Tachycardia present.     Pulses: Normal pulses.     Heart sounds: Normal heart sounds. No murmur heard.   No gallop.  Pulmonary:     Effort: Pulmonary effort is normal.     Breath sounds: Normal breath sounds. No wheezing, rhonchi or rales.  Skin:    General: Skin is warm and dry.     Coloration: Skin is not pale.  Neurological:     General: No focal deficit present.     Mental Status: He is alert and oriented to person, place, and time.  Psychiatric:        Mood and Affect: Mood normal.        Behavior: Behavior normal.        Thought Content: Thought content normal.        Judgment: Judgment normal.     UC Treatments / Results  Labs (all labs ordered are listed, but only abnormal results are displayed) Labs Reviewed - No data to display  EKG EKG shows sinus tachycardia with a ventricular rate of 102 bpm PR interval 162 ms QRS durations 94 ms QT/QTc 334/435 ms EKG shows elevation in leads II and III with ST depression in V2.   Radiology No results found.  Procedures Procedures (  including critical care time)  Medications Ordered in UC Medications  aspirin chewable tablet 324 mg (324 mg Oral Given 12/12/20 1209)    Initial Impression / Assessment and Plan / UC Course  I have reviewed the triage vital signs and the nursing notes.  Pertinent labs & imaging results that were available during my care of the patient were reviewed by me and considered in my medical decision making (see chart  for details).  Patient is a 55 year old male here for evaluation of left-sided chest pain that began in earnest approximately 1 hour prior to his arrival.  He has been dealing with intermittent chest pain in the left chest for months and is currently under the care of cardiology.  He was due to have a stress echo and medicated stress test today but had to be rescheduled due to insurance issues.  He has not called his cardiologist.  He reports that the pain became constant and more intense as he describes it as an intense cramp in his left chest that radiates to his left shoulder and down his left arm.  He reports that he did have some left shoulder pain intermittently yesterday.  He states he is experiencing shortness of breath and lightheadedness.  His shortness of breath he can tell but is above his baseline for his COPD or not.  He is not diaphoretic though his skin is clammy.  Patient has 1+ peripheral pulses globally.  Cap refill less than 2 seconds.  No pedal edema noted on exam.  Cardiopulmonary exam reveals S1-S2 heart sounds without murmur, rub, or ectopy.  Lungs demonstrate wheezing in all lung fields but no rales or rhonchi.  EKG today compared to EKG from 09/25/2019 which shows morphology change in lead V2 with ST depression and questionable depression in V1.  There is also elevation in leads II and III.  aVF is normal.  Due to patient's cardiac history and current presentation I am concerned that patient may be experiencing a cardiac event.  EMS called and patient will be transferred to the ER.  3 and 25 mg of aspirin given orally.  Given to first responders with [place].  Report given to [facility].  Care transferred.   Final Clinical Impressions(s) / UC Diagnoses   Final diagnoses:  Chest pain, unspecified type     Discharge Instructions      Due to your constant left-sided chest pain with radiation to your arm and lightheadedness I am recommending you go to the  emergency department for evaluation.  Please go by EMS.     ED Prescriptions   None    PDMP not reviewed this encounter.   Margarette Canada, NP 12/12/20 1221

## 2020-12-12 NOTE — ED Provider Notes (Signed)
[facility] Emergency Department Provider Note  ____________________________________________   Event Date/Time   First MD Initiated Contact with Patient 12/12/20 1606     (approximate)  I have reviewed the triage vital signs and the nursing notes.   HISTORY  Chief Complaint Chest Pain   HPI Darren Allen is a 55 y.o. male with a past medical history of HTN, DM, COPD with ongoing tobacco abuse and neuropathy who presents for assessment of some left-sided chest pain.  Patient states this has been going on for months and comes and go is not clearly related to exertion food or position.  States it lasted a few hours and he is currently chest pain-free.  States he was post get a stress test today but that have to be canceled due to some insurance issues but just wanted to get it checked out.  He has no change in his chronic cough right-sided chest pain or back pain.  He denies any headache, earache, sore throat, fevers, chills, vomiting, diarrhea, dysuria, abdominal pain, rash or extremity pain.  No recent falls or injuries.  He denies any other acute concerns at this time.         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   Calculus of kidney 02/04/2015   Perianal abscess 02/04/2015   Perirectal abscess    Chronic obstructive pulmonary disease ([place]) 01/17/2013   Acid reflux 01/17/2013   HLD (hyperlipidemia) 01/17/2013   Apnea, sleep 01/17/2013   Testicular hypofunction 01/17/2013   Allergic state 01/17/2013   Adiposity 07/06/2011    Past Surgical History:  Procedure Laterality Date   APPENDECTOMY     INCISION AND DRAINAGE  PERIRECTAL ABSCESS N/A 02/04/2015   Procedure: IRRIGATION AND DEBRIDEMENT PERIRECTAL ABSCESS;  Surgeon: Marlyce Huge, MD;  Location: [facility] ORS;  Service: General;  Laterality: N/A;   ORIF ANKLE FRACTURE Left 03/23/2017   Procedure: OPEN REDUCTION INTERNAL FIXATION (ORIF) ANKLE FRACTURE;  Surgeon: Leim Fabry, MD;  Location: [facility] ORS;  Service: Orthopedics;  Laterality: Left;   RECTAL EXAM UNDER ANESTHESIA  02/04/2015   Procedure: RECTAL EXAM UNDER ANESTHESIA;  Surgeon: Marlyce Huge, MD;  Location: [facility] ORS;  Service: General;;   SYNDESMOSIS REPAIR Left 03/23/2017   Procedure: SYNDESMOSIS REPAIR;  Surgeon: Leim Fabry, MD;  Location: [facility] ORS;  Service: Orthopedics;  Laterality: Left;    Prior to Admission medications   Medication Sig Start Date End Date Taking? Authorizing Provider  Accu-Chek Softclix Lancets lancets 3 (three) times daily. 09/30/20   [provider]  acetaminophen (TYLENOL) 500 MG tablet Take 2 tablets (1,000 mg total) by mouth every 8 (eight) hours. 03/24/17   Reche Dixon, PA-C  albuterol (PROVENTIL) (2.5 MG/3ML) 0.083% nebulizer solution Inhale into the lungs every 6 (six) hours as needed. 03/22/20 03/22/21  [provider]  amitriptyline (ELAVIL) 10 MG tablet Take by mouth. 10/12/20   [provider]  ascorbic acid (VITAMIN C) 500 MG tablet Take by mouth daily.    [provider]  BD DISP NEEDLES 22G X 1-1/2" MISC every 14 (fourteen) days. 09/09/20   [provider]  citalopram (CELEXA) 20  MG tablet Take 20 mg by mouth daily. 09/27/20   [provider]  cyanocobalamin 1000 MCG tablet Take by mouth daily. 12/28/19 12/27/20  [provider]  ferrous sulfate 325 (65 FE) MG tablet Take by mouth daily.    [provider]  gabapentin (NEURONTIN) 600 MG tablet  10/21/20   [provider]  hydrochlorothiazide (HYDRODIURIL) 25 MG tablet Take 1 tablet by mouth daily. 02/26/17   [provider]   HYDROcodone-acetaminophen (NORCO/VICODIN) 5-325 MG tablet Take 1 tablet by mouth 2 (two) times daily as needed. 11/20/20   [provider]  hydrOXYzine (ATARAX/VISTARIL) 25 MG tablet Take 1 tablet by mouth 3 (three) times daily as needed for anxiety. 01/08/20   [provider]  insulin isophane & regular human (HUMULIN 70/30 KWIKPEN) (70-30) 100 UNIT/ML KwikPen Inject 40 Units into the skin 2 (two) times daily with a meal. 10/14/19   [provider]  lidocaine-prilocaine (EMLA) cream Apply topically once. 07/25/20   [provider]  lisinopril (PRINIVIL,ZESTRIL) 40 MG tablet Take 1 tablet by mouth daily. 02/26/17   [provider]  meloxicam (MOBIC) 15 MG tablet Take 1 tablet (15 mg total) by mouth daily as needed for pain. 10/01/20   Coral Spikes, DO  metFORMIN (GLUCOPHAGE-XR) 500 MG 24 hr tablet Take 4 tablets by mouth daily. 02/26/17   [provider]  NEEDLE, DISP, 18 G (B-D BLUNT FILL NEEDLE) 18G X 1-1/2" MISC Use 18G needle to draw testosterone for injection 10/15/20   Billey Co, MD  nicotine (NICODERM CQ - DOSED IN MG/24 HOURS) 21 mg/24hr patch Place 1 patch onto the skin daily. 06/01/14   [provider]  pantoprazole (PROTONIX) 40 MG tablet Take 2 tablets by mouth daily. 03/03/17   [provider]  rosuvastatin (CRESTOR) 40 MG tablet Take 40 mg by mouth daily. 09/30/20   [provider]  SYRINGE-NEEDLE, DISP, 3 ML (BD ECLIPSE SYRINGE) 21G X 1" 3 ML MISC Use to administer testosterone 10/15/20   Billey Co, MD  tadalafil (CIALIS) 5 MG tablet Take 0.5-1 tablets (2.5-5 mg total) by mouth daily as needed for erectile dysfunction. 10/15/20   Billey Co, MD  testosterone cypionate (DEPOTESTOSTERONE CYPIONATE) 200 MG/ML injection Inject 1 mL (200 mg total) into the muscle every 14 (fourteen) days. 10/15/20   Billey Co, MD  tiZANidine (ZANAFLEX) 4 MG tablet Take 1 tablet (4 mg total) by mouth every 8 (eight)  hours as needed for muscle spasms. 10/01/20   Thersa Salt G, DO  TRELEGY ELLIPTA 200-62.5-25 MCG/INH AEPB Inhale 1 puff into the lungs daily. 10/10/20   [provider]    Allergies Glipizide and Duloxetine  Family History  Problem Relation Age of Onset   Cancer Mother 18       Lung   Cancer Father        Colon   Heart disease Father    Alcohol abuse Father    Cancer Brother 30       Esophageal   Diabetes Brother    Heart disease Brother     Social History Social History   Tobacco Use   Smoking status: Former    Packs/day: 0.50    Years: 34.00    Pack years: 17.00    Types: Cigarettes    Quit date: 06/15/2019    Years since quitting: 1.4   Smokeless tobacco: Never   Tobacco comments:    patient using Nicoderm patches  Vaping Use  Vaping Use: Never used  Substance Use Topics   Alcohol use: Yes    Alcohol/week: 12.0 standard drinks    Types: 12 Cans of beer per week    Comment: varies- only drinks on weekend. 01/26/20 3 beers per weekend   Drug use: No    Review of Systems  Review of Systems  Constitutional:  Negative for chills and fever.  HENT:  Negative for sore throat.   Eyes:  Negative for pain.  Respiratory:  Positive for cough (chronic). Negative for stridor.   Cardiovascular:  Positive for chest pain.  Gastrointestinal:  Negative for vomiting.  Genitourinary:  Negative for dysuria.  Musculoskeletal:  Negative for myalgias.  Skin:  Negative for rash.  Neurological:  Negative for seizures, loss of consciousness and headaches.  Psychiatric/Behavioral:  Positive for substance abuse (tobacco). Negative for suicidal ideas.   All other systems reviewed and are negative.    ____________________________________________   PHYSICAL EXAM:  VITAL SIGNS: ED Triage Vitals  Enc Vitals Group     BP 12/12/20 1333 129/86     Pulse Rate 12/12/20 1333 95     Resp 12/12/20 1333 17     Temp 12/12/20 1333 98.6 F (37 C)     Temp Source 12/12/20 1333 Oral      SpO2 12/12/20 1333 96 %     Weight 12/12/20 1333 250 lb (113.4 kg)     Height 12/12/20 1333 6\' 2"  (1.88 m)     Head Circumference --      Peak Flow --      Pain Score 12/12/20 1332 0     Pain Loc --      Pain Edu? --      Excl. in [place]? --    Vitals:   12/12/20 1333  BP: 129/86  Pulse: 95  Resp: 17  Temp: 98.6 F (37 C)  SpO2: 96%   Physical Exam Vitals and nursing note reviewed.  Constitutional:      Appearance: He is well-developed.  HENT:     Head: Normocephalic and atraumatic.     Right Ear: External ear normal.     Left Ear: External ear normal.     Nose: Nose normal.     Mouth/Throat:     Mouth: Mucous membranes are moist.  Eyes:     Conjunctiva/sclera: Conjunctivae normal.  Cardiovascular:     Rate and Rhythm: Normal rate and regular rhythm.     Heart sounds: No murmur heard. Pulmonary:     Effort: Pulmonary effort is normal. No respiratory distress.     Breath sounds: Normal breath sounds.  Abdominal:     Palpations: Abdomen is soft.     Tenderness: There is no abdominal tenderness.  Musculoskeletal:     Cervical back: Neck supple.  Skin:    General: Skin is warm and dry.     Capillary Refill: Capillary refill takes less than 2 seconds.  Neurological:     Mental Status: He is alert and oriented to person, place, and time.  Psychiatric:        Mood and Affect: Mood normal.     ____________________________________________   LABS (all labs ordered are listed, but only abnormal results are displayed)  Labs Reviewed  COMPREHENSIVE METABOLIC PANEL - Abnormal; Notable for the following components:      Result Value   Glucose, Bld 110 (*)    BUN 22 (*)    Creatinine, Ser 1.35 (*)    All other components within  normal limits  CBC WITH DIFFERENTIAL/PLATELET  TROPONIN I (HIGH SENSITIVITY)  TROPONIN I (HIGH SENSITIVITY)   ____________________________________________  EKG  Sinus rhythm with a ventricular rate of 96, normal axis, unremarkable  intervals without clear evidence of acute ischemia or significant arrhythmia. ____________________________________________  RADIOLOGY  ED MD interpretation: Chest x-ray has evidence of chronic scarring and emphysema without focal consolidation, effusion, edema, pneumothorax or other acute thoracic process.  Official radiology report(s): DG Chest 2 View  Result Date: 12/12/2020 CLINICAL DATA:  chest pain EXAM: CHEST - 2 VIEW COMPARISON:  Multiple priors, most recent 03/13/2020. FINDINGS: Mild chronic hyperexpansion. Chronic interstitial changes bilaterally, which appears similar to multiple priors. No new consolidation, visible pleural effusions or pneumothorax. Cardiomediastinal silhouette is similar to prior and within normal limits. No acute osseous abnormality. IMPRESSION: 1. Chronic interstitial changes bilaterally, which appears similar to multiple priors. No acute abnormality. 2. Mild chronic hyperexpansion, suggestive of emphysema. Electronically Signed   By: Margaretha Sheffield M.D.   On: 12/12/2020 14:19    ____________________________________________   PROCEDURES  Procedure(s) performed (including Critical Care):  Procedures   ____________________________________________   INITIAL IMPRESSION / ASSESSMENT AND PLAN / ED COURSE      Presents with above to history exam for assessment of a couple hours of left-sided chest pain that resolved prior to arrival.  This is an identical location and feels similar to pain he has had on and off going back several months.  It is not any different today.  No clear other associated sick symptoms.  On arrival he is afebrile hemodynamically stable.  Differential includes ACS, GERD, pneumonia, PE, pericarditis, myocarditis and chest wall inflammation.  ECG and nonelevated troponin obtained greater than 3 hours after symptom onset are not suggestive of ACS or myocarditis.  CMP shows no significant electrolyte metabolic derangements.  CBC shows  no leukocytosis or acute anemia.   Chest x-ray has evidence of chronic scarring and emphysema without focal consolidation, effusion, edema, pneumothorax or other acute thoracic process.  Overall a very low suspicion for acute infectious process i.e. pneumonia.  There is no evidence of shingles or cellulitis.  Given he states he is currently chest pain-free without hypoxia, tachypnea or tachycardia and overall description of chronicity of symptoms I have a low suspicion for PE or dissection.  Unclear precise etiology at this time although given stable vitals and chronicity with otherwise reassuring exam and work-up I think is stable for discharge with close outpatient PCP follow-up.  Discharged stable condition.  Strict return precautions advised and discussed.  Counseled on importance of tobacco cessation.     ____________________________________________   FINAL CLINICAL IMPRESSION(S) / ED DIAGNOSES  Final diagnoses:  Chest pain, unspecified type  Tobacco abuse  Chronic obstructive pulmonary disease, unspecified COPD type ([place])    Medications - No data to display   ED Discharge Orders     None        Note:  This document was prepared using Dragon voice recognition software and may include unintentional dictation errors.    Lucrezia Starch, MD 12/12/20 [phone]

## 2020-12-12 NOTE — ED Provider Notes (Signed)
HPI: Pt is a 55 y.o. male who presents with complaints of chest pain   The patient p/w  chest pain this morning. Had echo/stress was planned to be preformed. No sob different then then normal COPD   ROS: Denies fever  Past Medical History:  Diagnosis Date   Calculus of kidney 02/04/2015   COPD (chronic obstructive pulmonary disease) (HCC)    Diabetes mellitus without complication ([place])    type 2   Hypertension    Neuropathy    There were no vitals filed for this visit.  Focused Physical Exam: Gen: No acute distress Head: atraumatic, normocephalic Eyes: Extraocular movements grossly intact; conjunctiva clear CV: RRR Lung: No increased WOB, no stridor GI: ND, no obvious masses Neuro: Alert and awake  Medical Decision Making and Plan: Given the patient's initial medical screening exam, the following diagnostic evaluation has been ordered. The patient will be placed in the appropriate treatment space, once one is available, to complete the evaluation and treatment. I have discussed the plan of care with the patient and I have advised the patient that an ED physician or mid-level practitioner will reevaluate their condition after the test results have been received, as the results may give them additional insight into the type of treatment they may need.   Diagnostics: labs, xray   Treatments: none immediately   Vanessa [place], MD 12/12/20 1330

## 2020-12-12 NOTE — ED Notes (Signed)
Pt here with chest pain x1 month. Sts the pain felt worse this morning.  Pt seen by provider and sent to ED via EMS.

## 2020-12-12 NOTE — Discharge Instructions (Addendum)
Due to your constant left-sided chest pain with radiation to your arm and lightheadedness I am recommending you go to the emergency department for evaluation.  Please go by EMS.

## 2020-12-12 NOTE — ED Triage Notes (Signed)
Pt c/o chest pain for about 4 months. Pt reports sudden onset of increased pain, left-sided, this morning. Pt also has pain into his left shoulder and radiating down his arm. Pt is concerned about a possible heart attack. Pt does have COPD and chronic shob.

## 2020-12-12 NOTE — ED Triage Notes (Addendum)
Pt comes into the ED via EMS [facility] with c/o chest pain for the past 60mo, scheduled for echo and stress test today but the insurance was pending and they moved it up to october. Woke up with left sided chest pain this morning with dizziness  Reported EKG changes  325mg  ASA 0/10 HR101 138/95 #[id number]

## 2020-12-27 DIAGNOSIS — I5189 Other ill-defined heart diseases: Secondary | ICD-10-CM

## 2020-12-27 HISTORY — DX: Other ill-defined heart diseases: I51.89

## 2021-02-27 ENCOUNTER — Ambulatory Visit (INDEPENDENT_AMBULATORY_CARE_PROVIDER_SITE_OTHER): Payer: Medicaid Other

## 2021-02-27 ENCOUNTER — Ambulatory Visit
Admission: EM | Admit: 2021-02-27 | Discharge: 2021-02-27 | Disposition: A | Discharge status: Home / Self Care | Payer: Medicaid Other | Attending: Physician Assistant | Admitting: Physician Assistant

## 2021-02-27 ENCOUNTER — Other Ambulatory Visit: Payer: Self-pay

## 2021-02-27 DIAGNOSIS — R0781 Pleurodynia: Secondary | ICD-10-CM

## 2021-02-27 DIAGNOSIS — J441 Chronic obstructive pulmonary disease with (acute) exacerbation: Secondary | ICD-10-CM

## 2021-02-27 DIAGNOSIS — S20212A Contusion of left front wall of thorax, initial encounter: Secondary | ICD-10-CM

## 2021-02-27 DIAGNOSIS — R079 Chest pain, unspecified: Secondary | ICD-10-CM

## 2021-02-27 DIAGNOSIS — W19XXXA Unspecified fall, initial encounter: Secondary | ICD-10-CM | POA: Diagnosis not present

## 2021-02-27 IMAGING — CR DG RIBS W/ CHEST 3+V*L*
5 series · 5 of 5 positions shown · non-contrast
Comparison: [DATE]

CLINICAL DATA: Fall, left-sided rib pain

EXAM:
LEFT RIBS AND CHEST - 3+ VIEW

[chest pa]
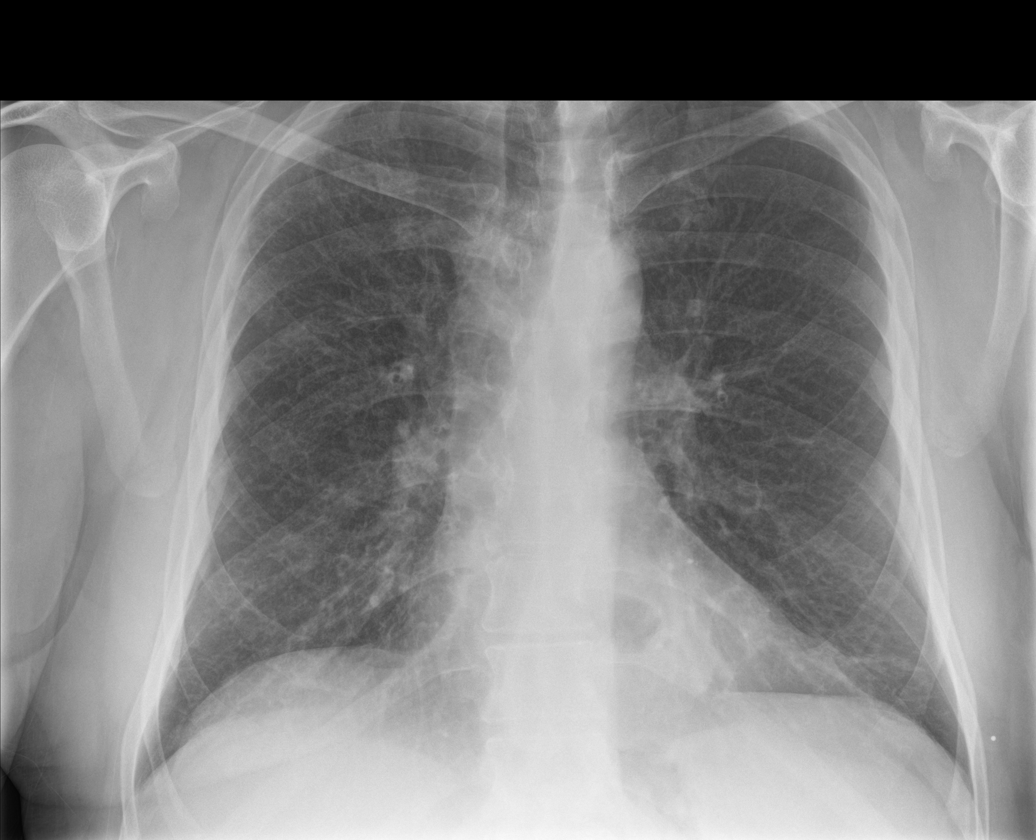

[rib pa (1 of 2)]
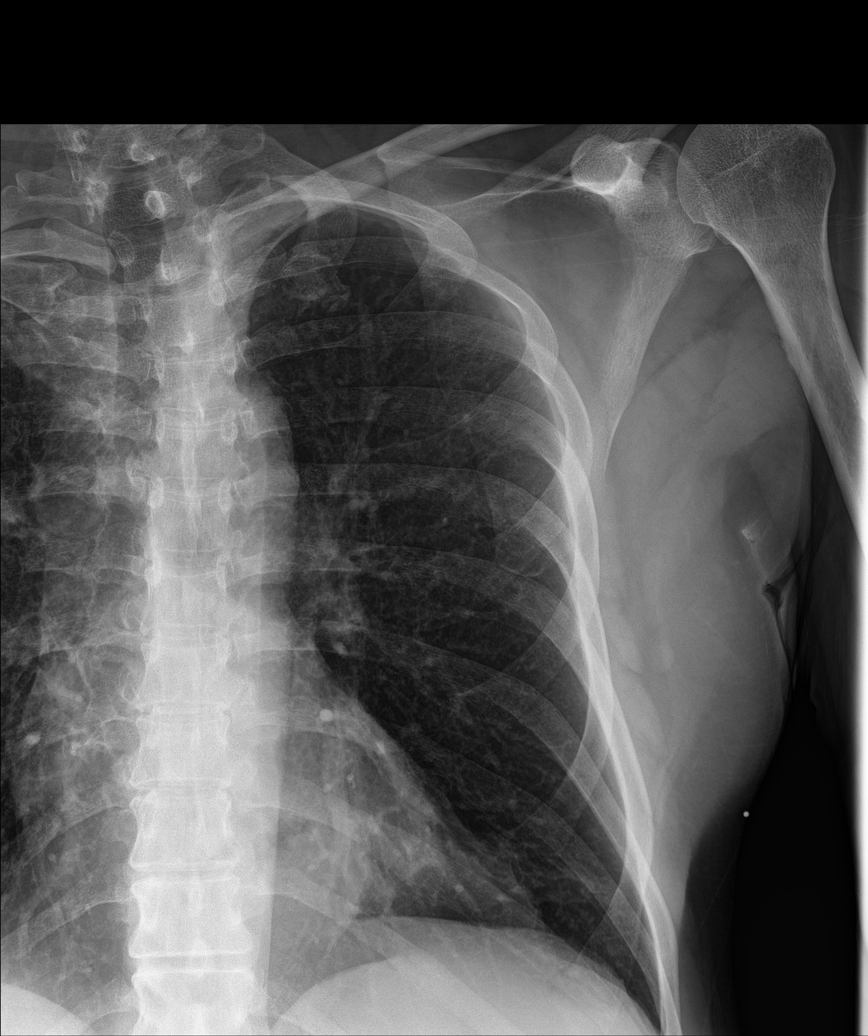

[rib pa (2 of 2)]
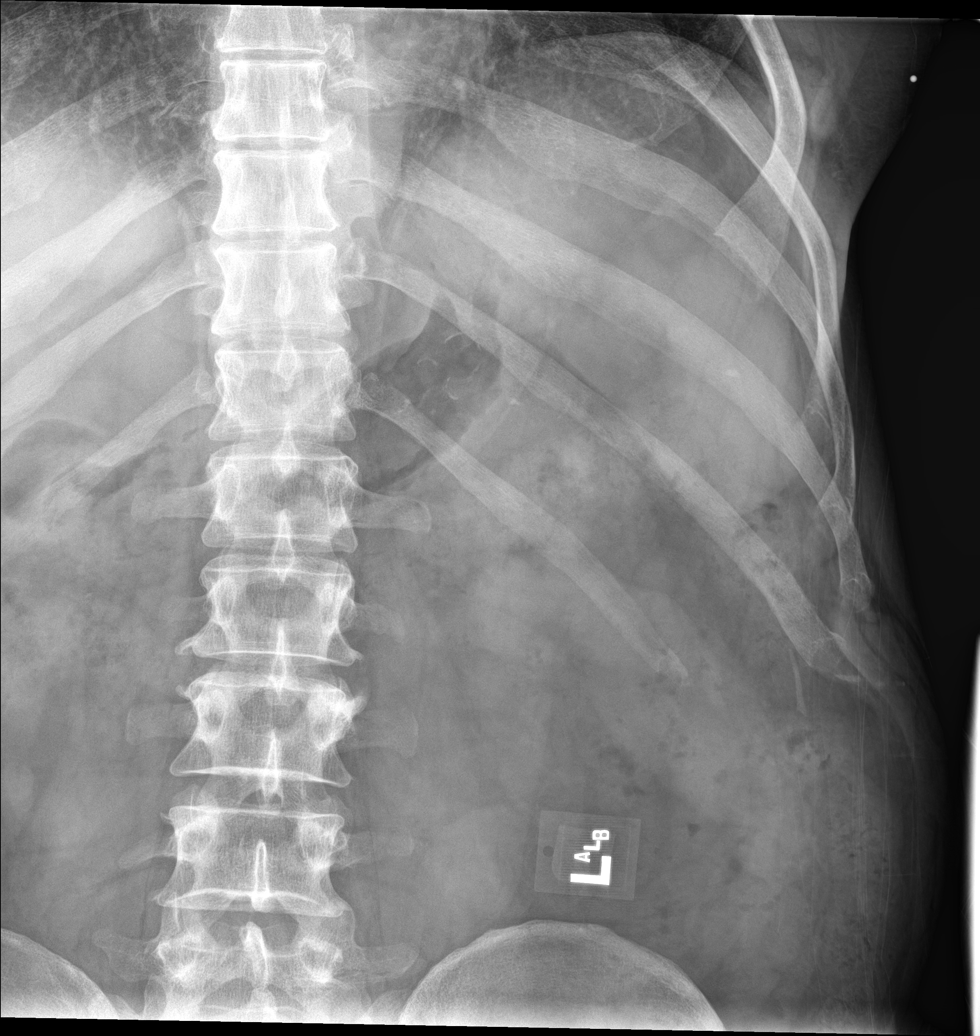

[rib obl (1 of 2)]
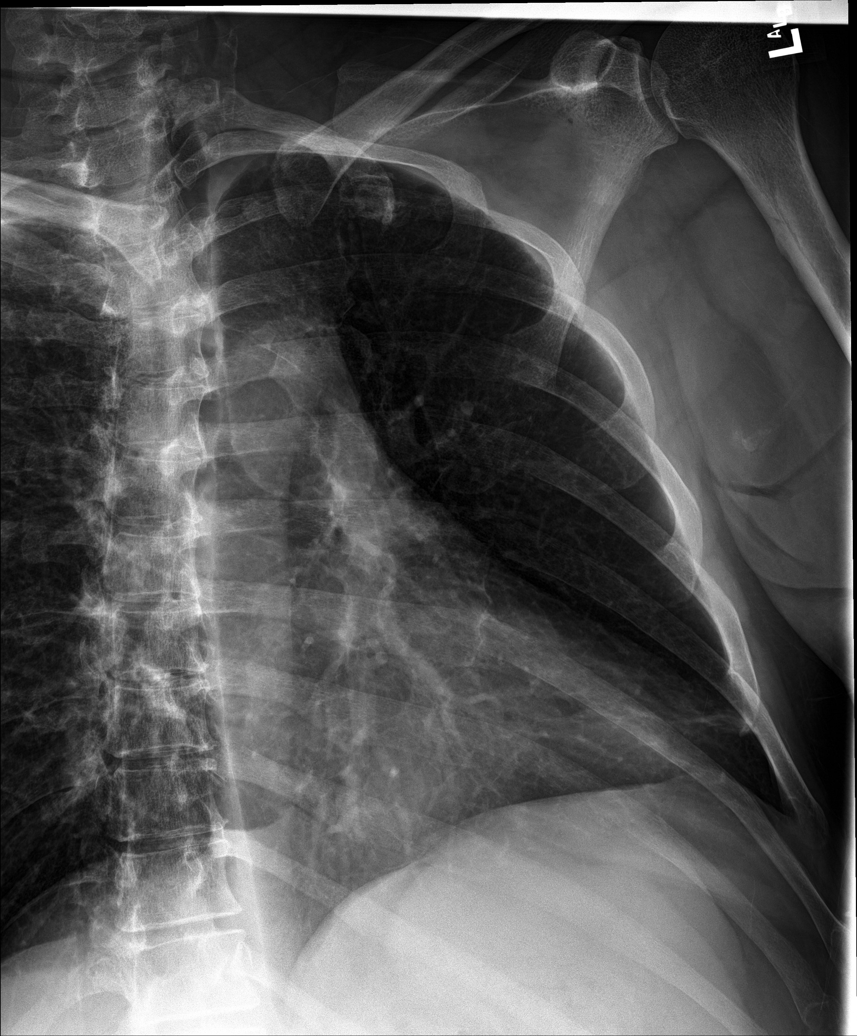

[rib obl (2 of 2)]
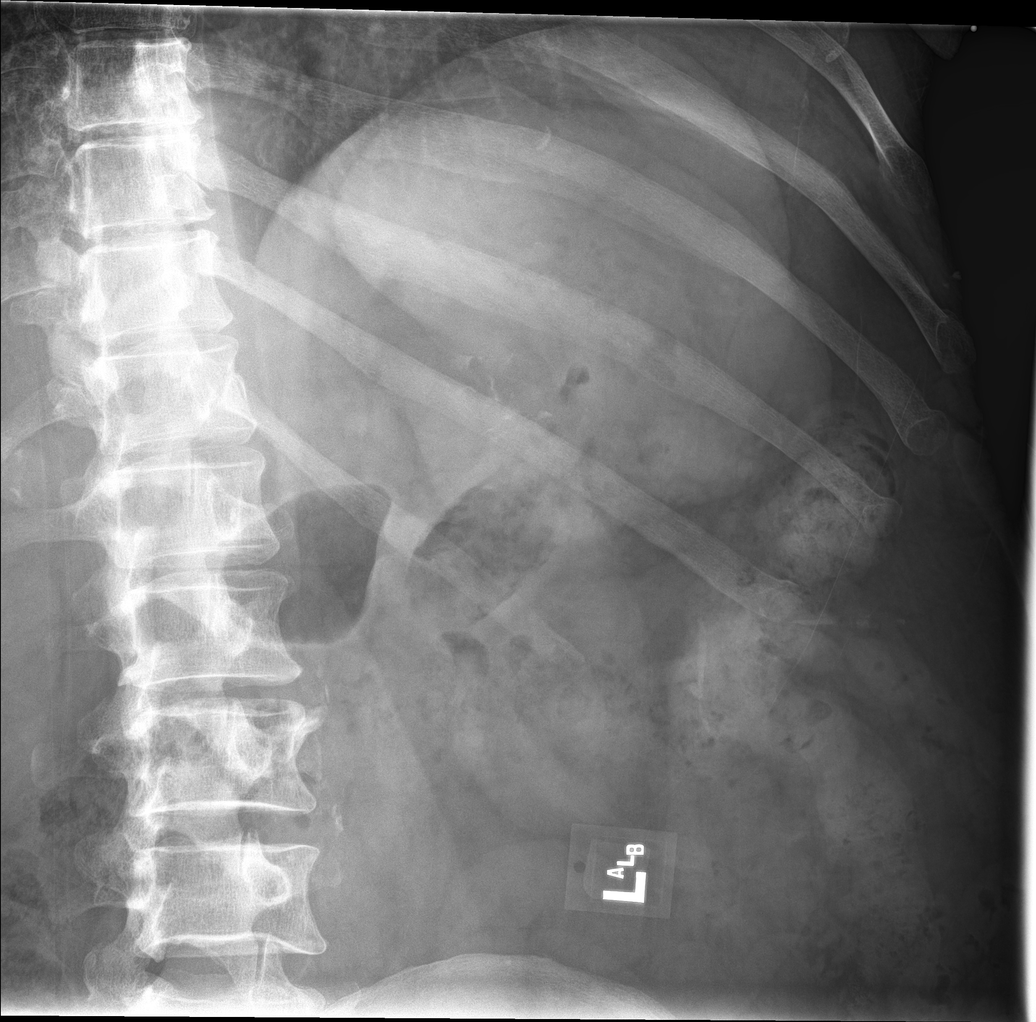

[5 of 5 positions shown; findings below may reference images not displayed]

FINDINGS: No fracture or other bone lesions are seen involving the ribs. There
is no evidence of pneumothorax or pleural effusion. Probable
emphysema. Heart size and mediastinal contours are within normal
limits.
IMPRESSION: 1. No displaced fracture or other radiographic abnormality to
explain left rib pain.

2.  Probable emphysema.

## 2021-02-27 MED ORDER — PREDNISONE 20 MG PO TABS: 40.0000 mg | ORAL_TABLET | Freq: Every day | ORAL | 0 refills | Status: AC

## 2021-02-27 MED ORDER — DOXYCYCLINE HYCLATE 100 MG PO CAPS: 100.0000 mg | ORAL_CAPSULE | Freq: Two times a day (BID) | ORAL | 0 refills | Status: AC

## 2021-02-27 NOTE — ED Provider Notes (Signed)
[facility]    CSN: [id number] Arrival date & time: 02/27/21  0800      History   Chief Complaint Chief Complaint  Patient presents with   Fall    HPI Darren Allen is a 55 y.o. male presenting for left chest and rib pain following accidental fall 2 days ago.  Patient says he tripped on something and fell over his propane tank.  Reports increased pain when he touches the area and when he breathes or coughs.  Patient does have history of COPD and says of the past couple weeks he has been coughing up yellowish-green mucus.  Denies any change in his breathing.  Has been using his Trelegy inhaler like he is supposed to.  Does not report any fevers.  Has not really taken anything for the rib or chest pain.  Of note, he was seen last week by cardiology for chest pain at rest.  Patient reportedly had a negative exercise and chemical stress test.  Patient says he has been cleared from cardiology standpoint.  It is believed that his chest pain may be related to COPD.  He has a current smoker.  He is also diabetic and hypertensive.  Patient does not report any other injuries.  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   Calculus of kidney 02/04/2015   Perianal abscess 02/04/2015   Perirectal abscess    Chronic obstructive pulmonary disease ([place]) 01/17/2013   Acid reflux 01/17/2013   HLD (hyperlipidemia) 01/17/2013   Apnea, sleep 01/17/2013   Testicular hypofunction 01/17/2013   Allergic state 01/17/2013   Adiposity 07/06/2011    Past Surgical History:  Procedure Laterality Date   APPENDECTOMY     INCISION AND DRAINAGE PERIRECTAL ABSCESS N/A  02/04/2015   Procedure: IRRIGATION AND DEBRIDEMENT PERIRECTAL ABSCESS;  Surgeon: Marlyce Huge, MD;  Location: [facility] OR[facility];  Service: General;  Laterality: N/A;   ORIF ANKLE FRACTURE Left 03/23/2017   Procedure: OPEN REDUCTION INTERNAL FIXATION (ORIF) ANKLE FRACTURE;  Surgeon: Leim Fabry, MD;  Location: [facility] OR[facility];  Service: Orthopedics;  Laterality: Left;   RECTAL EXAM UNDER ANESTHESIA  02/04/2015   Procedure: RECTAL EXAM UNDER ANESTHESIA;  Surgeon: Marlyce Huge, MD;  Location: [facility] OR[facility];  Service: General;;   SYNDESMOSIS REPAIR Left 03/23/2017   Procedure: SYNDESMOSIS REPAIR;  Surgeon: Leim Fabry, MD;  Location: [facility] OR[facility];  Service: Orthopedics;  Laterality: Left;       Home Medications    Prior to Admission medications   Medication Sig Start Date End Date Taking? Authorizing Provider  Accu-Chek Softclix Lancets lancets 3 (three) times daily. 09/30/20  Yes [provider]  acetaminophen (TYLENOL) 500 MG tablet Take 2 tablets (1,000 mg total) by mouth every 8 (eight) hours. 03/24/17  Yes Reche Dixon, PA-C  albuterol (PROVENTIL) (2.5 MG/3ML) 0.083% nebulizer solution Inhale into the lungs every 6 (six) hours as needed. 03/22/20 03/22/21 Yes [provider]  amitriptyline (ELAVIL) 10 MG tablet Take by mouth. 10/12/20  Yes [provider]  ascorbic acid (VITAMIN C) 500 MG tablet Take by mouth daily.   Yes [provider]  BD DISP NEEDLES  22G X 1-1/2" MISC every 14 (fourteen) days. 09/09/20  Yes [provider]  citalopram (CELEXA) 20 MG tablet Take 20 mg by mouth daily. 09/27/20  Yes [provider]  doxycycline (VIBRAMYCIN) 100 MG capsule Take 1 capsule (100 mg total) by mouth 2 (two) times daily for 7 days. 02/27/21 03/06/21 Yes Laurene Footman B, PA-C  ferrous sulfate 325 (65 FE) MG tablet Take by mouth daily.   Yes [provider]  gabapentin (NEURONTIN) 600 MG tablet  10/21/20  Yes [provider]  hydrochlorothiazide  (HYDRODIURIL) 25 MG tablet Take 1 tablet by mouth daily. 02/26/17  Yes [provider]  HYDROcodone-acetaminophen (NORCO/VICODIN) 5-325 MG tablet Take 1 tablet by mouth 2 (two) times daily as needed. 11/20/20  Yes [provider]  hydrOXYzine (ATARAX/VISTARIL) 25 MG tablet Take 1 tablet by mouth 3 (three) times daily as needed for anxiety. 01/08/20  Yes [provider]  insulin isophane & regular human (HUMULIN 70/30 KWIKPEN) (70-30) 100 UNIT/ML KwikPen Inject 40 Units into the skin 2 (two) times daily with a meal. 10/14/19  Yes [provider]  lidocaine-prilocaine (EMLA) cream Apply topically once. 07/25/20  Yes [provider]  lisinopril (PRINIVIL,ZESTRIL) 40 MG tablet Take 1 tablet by mouth daily. 02/26/17  Yes [provider]  metFORMIN (GLUCOPHAGE-XR) 500 MG 24 hr tablet Take 4 tablets by mouth daily. 02/26/17  Yes [provider]  NEEDLE, DISP, 18 G (B-D BLUNT FILL NEEDLE) 18G X 1-1/2" MISC Use 18G needle to draw testosterone for injection 10/15/20  Yes Sninsky, Herbert Seta, MD  nicotine (NICODERM CQ - DOSED IN MG/24 HOURS) 21 mg/24hr patch Place 1 patch onto the skin daily. 06/01/14  Yes [provider]  pantoprazole (PROTONIX) 40 MG tablet Take 2 tablets by mouth daily. 03/03/17  Yes [provider]  predniSONE (DELTASONE) 20 MG tablet Take 2 tablets (40 mg total) by mouth daily for 5 days. 02/27/21 03/04/21 Yes Laurene Footman B, PA-C  rosuvastatin (CRESTOR) 40 MG tablet Take 40 mg by mouth daily. 09/30/20  Yes [provider]  SYRINGE-NEEDLE, DISP, 3 ML (BD ECLIPSE SYRINGE) 21G X 1" 3 ML MISC Use to administer testosterone 10/15/20  Yes Billey Co, MD  tadalafil (CIALIS) 5 MG tablet Take 0.5-1 tablets (2.5-5 mg total) by mouth daily as needed for erectile dysfunction. 10/15/20  Yes Billey Co, MD  testosterone cypionate (DEPOTESTOSTERONE CYPIONATE) 200 MG/ML injection Inject 1 mL (200 mg total) into the  muscle every 14 (fourteen) days. 10/15/20  Yes Billey Co, MD  tiZANidine (ZANAFLEX) 4 MG tablet Take 1 tablet (4 mg total) by mouth every 8 (eight) hours as needed for muscle spasms. 10/01/20  Yes Cook, Jayce G, DO  TRELEGY ELLIPTA 200-62.5-25 MCG/INH AEPB Inhale 1 puff into the lungs daily. 10/10/20  Yes [provider]    Family History Family History  Problem Relation Age of Onset   Cancer Mother 11       Lung   Cancer Father        Colon   Heart disease Father    Alcohol abuse Father    Cancer Brother 59       Esophageal   Diabetes Brother    Heart disease Brother     Social History Social History   Tobacco Use   Smoking status: Former    Packs/day: 0.50    Years: 34.00    Pack years: 17.00    Types: Cigarettes    Quit date: 06/15/2019  Years since quitting: 1.7   Smokeless tobacco: Never   Tobacco comments:    patient using Nicoderm patches  Vaping Use   Vaping Use: Never used  Substance Use Topics   Alcohol use: Yes    Alcohol/week: 12.0 standard drinks    Types: 12 Cans of beer per week    Comment: varies- only drinks on weekend. 01/26/20 3 beers per weekend   Drug use: No     Allergies   Glipizide and Duloxetine   Review of Systems Review of Systems  Constitutional:  Negative for fatigue and fever.  HENT:  Positive for congestion. Negative for rhinorrhea, sinus pressure, sinus pain and sore throat.   Eyes:  Negative for visual disturbance.  Respiratory:  Positive for cough. Negative for shortness of breath and wheezing.   Cardiovascular:  Positive for chest pain. Negative for palpitations.  Gastrointestinal:  Negative for abdominal pain, diarrhea, nausea and vomiting.  Musculoskeletal:  Negative for myalgias.  Skin:  Negative for color change and wound.  Neurological:  Negative for syncope, weakness, light-headedness and headaches.  Hematological:  Negative for adenopathy.    Physical Exam Triage Vital Signs ED Triage Vitals  [02/27/21 0813]  Enc Vitals Group     BP      Pulse      Resp      Temp      Temp src      SpO2      Weight 245 lb (111.1 kg)     Height 6\' 2"  (1.88 m)     Head Circumference      Peak Flow      Pain Score 8     Pain Loc      Pain Edu?      Excl. in [place]?    No data found.  Updated Vital Signs BP 140/89 (BP Location: Left Arm)    Pulse 94    Temp 98.4 F (36.9 C) (Oral)    Resp 18    Ht 6\' 2"  (1.88 m)    Wt 245 lb (111.1 kg)    SpO2 98%    BMI 31.46 kg/m      Physical Exam Vitals and nursing note reviewed.  Constitutional:      General: He is not in acute distress.    Appearance: Normal appearance. He is well-developed. He is not ill-appearing.  HENT:     Head: Normocephalic and atraumatic.  Eyes:     General: No scleral icterus.    Conjunctiva/sclera: Conjunctivae normal.  Cardiovascular:     Rate and Rhythm: Normal rate and regular rhythm.     Heart sounds: Normal heart sounds.  Pulmonary:     Effort: Pulmonary effort is normal. No respiratory distress.     Breath sounds: Wheezing (diffuse wheezes throughout all lung fields) present.  Chest:     Chest wall: Tenderness (TTP ribs 5-7 anteriorly and laterally (right)) present.  Musculoskeletal:     Cervical back: Neck supple.  Skin:    General: Skin is warm and dry.     Capillary Refill: Capillary refill takes less than 2 seconds.  Neurological:     General: No focal deficit present.     Mental Status: He is alert. Mental status is at baseline.     Motor: No weakness.     Coordination: Coordination normal.     Gait: Gait normal.  Psychiatric:        Mood and Affect: Mood normal.  Behavior: Behavior normal.        Thought Content: Thought content normal.     UC Treatments / Results  Labs (all labs ordered are listed, but only abnormal results are displayed) Labs Reviewed - No data to display  EKG   Radiology DG Ribs Unilateral W/Chest Left  Result Date: 02/27/2021 CLINICAL DATA:  Fall,  left-sided rib pain EXAM: LEFT RIBS AND CHEST - 3+ VIEW COMPARISON:  12/12/2020 FINDINGS: No fracture or other bone lesions are seen involving the ribs. There is no evidence of pneumothorax or pleural effusion. Probable emphysema. Heart size and mediastinal contours are within normal limits. IMPRESSION: 1. No displaced fracture or other radiographic abnormality to explain left rib pain. 2.  Probable emphysema. Electronically Signed   By: Delanna Ahmadi M.D.   On: 02/27/2021 08:52    Procedures Procedures (including critical care time)  Medications Ordered in UC Medications - No data to display  Initial Impression / Assessment and Plan / UC Course  I have reviewed the triage vital signs and the nursing notes.  Pertinent labs & imaging results that were available during my care of the patient were reviewed by me and considered in my medical decision making (see chart for details).  55 year old male presenting for left chest and rib pain following accidental fall 2 days ago.  Fell over a propane tank.  Patient reports increased pain when he coughs, breathes or touches the area.  On exam he does have tenderness palpation of the ribs 5 through 7 anteriorly/laterally.  Diffuse wheezing throughout chest.  Patient reporting productive cough over the past couple of weeks as well.  No fevers.  No increased breathing difficulty.  Chest x-ray and rib x-ray obtained today.  No evidence of fracture.  No evidence of pneumonia.  Discussed this with patient.  Treating patient's COPD exacerbation with doxycycline and prednisone.  Patient is insulin-dependent diabetic.  Says he takes prednisone often for COPD flares.  Says that he is well versed in how to manage this with increasing his insulin depending on the value of his blood sugars.  Advised him to monitor closely and make sure to stay hydrated and avoid carbs.  Continue with the inhalers.  ED if fever or breathing worsens.  Treating suspected rib  contusion/chest wall contusion with rest, ice, Tylenol for pain.  I did offer him something for pain but he declines.  Reviewed going to ED for any worsening of the chest pain, or red flag signs/symptoms.  I did review his recent exercise stress test and chemical stress test which were reassuring.  He had this done a days ago.  I do not suspect that his pain is related to cardiac cause and seems consistent with contusion of chest wall and rib.   Final Clinical Impressions(s) / UC Diagnoses   Final diagnoses:  Contusion of left chest wall, initial encounter  Rib pain on left side  Fall, initial encounter  COPD exacerbation ([place])     Discharge Instructions      -Your x-ray does not show any evidence of rib fracture but I suspect you probably bruised your ribs and chest wall he should ice the area and take Tylenol for pain relief.  The prednisone I sent for your COPD exacerbation can also be helpful to reduce inflammation and swelling. - No evidence of pneumonia.  I have sent antibiotics for the COPD exacerbation.  Continue with your inhalers.  Increase rest and fluids. - It is reassuring that you had a  normal cardiac work-up last week.  However, if you have worsening left-sided chest or rib pain, pain that goes to your left arm, sweats, dizziness, weakness, vomiting or feeling generally worse need to call 911 or go to ER. -Also keep a check on your blood sugars since you are taking the prednisone and it can raise them.  May need to take more insulin.     ED Prescriptions     Medication Sig Dispense Auth. Provider   doxycycline (VIBRAMYCIN) 100 MG capsule Take 1 capsule (100 mg total) by mouth 2 (two) times daily for 7 days. 14 capsule Laurene Footman B, PA-C   predniSONE (DELTASONE) 20 MG tablet Take 2 tablets (40 mg total) by mouth daily for 5 days. 10 tablet Gretta Cool      PDMP not reviewed this encounter.   Danton Clap, PA-C 02/27/21 [phone]

## 2021-02-27 NOTE — ED Triage Notes (Signed)
Pt was working at home and fell over his propane tank after turning off the lights on Monday. Pt landed with the propane tank hitting him in his left side along the ribs. Pt took 1000mg  this morning at 5:30am for pain.

## 2021-02-27 NOTE — Discharge Instructions (Signed)
-  Your x-ray does not show any evidence of rib fracture but I suspect you probably bruised your ribs and chest wall he should ice the area and take Tylenol for pain relief.  The prednisone I sent for your COPD exacerbation can also be helpful to reduce inflammation and swelling. - No evidence of pneumonia.  I have sent antibiotics for the COPD exacerbation.  Continue with your inhalers.  Increase rest and fluids. - It is reassuring that you had a normal cardiac work-up last week.  However, if you have worsening left-sided chest or rib pain, pain that goes to your left arm, sweats, dizziness, weakness, vomiting or feeling generally worse need to call 911 or go to ER. -Also keep a check on your blood sugars since you are taking the prednisone and it can raise them.  May need to take more insulin.

## 2021-03-18 ENCOUNTER — Ambulatory Visit: Payer: Medicaid Other

## 2021-04-16 ENCOUNTER — Other Ambulatory Visit: Payer: Self-pay

## 2021-04-16 ENCOUNTER — Ambulatory Visit: Payer: Medicaid Other | Admitting: Dermatology

## 2021-04-16 DIAGNOSIS — L720 Epidermal cyst: Secondary | ICD-10-CM

## 2021-04-16 DIAGNOSIS — L905 Scar conditions and fibrosis of skin: Secondary | ICD-10-CM

## 2021-04-16 DIAGNOSIS — L700 Acne vulgaris: Secondary | ICD-10-CM | POA: Diagnosis not present

## 2021-04-16 NOTE — Progress Notes (Signed)
° °  New Patient Visit  Subjective  Darren Allen is a 56 y.o. male who presents for the following: Other (New patient - Spots of face and chest that are hard. He has been seeing someone at [facility] but they have not been able to figure out what is going on.).  Accompanied by fiance  The following portions of the chart were reviewed this encounter and updated as appropriate:   Tobacco   Allergies   Meds   Problems   Med Hx   Surg Hx   Fam Hx      Review of Systems:  No other skin or systemic complaints except as noted in HPI or Assessment and Plan.  Objective  Well appearing patient in no apparent distress; mood and affect are within normal limits.  All skin waist up examined.  Face, chest Deep confluent comedones; milium and cysts of chest and cheeks with some scarring            Assessment & Plan  Acne vulgaris with confluent cysts milium and comedones on the face and chest with scarring Not improved with multiple treatments at [facility] office Face, chest  Discussed isotretinoin.  Advised that treatment with isotretinoin would be the best chance of improving his condition.  He may have some persistent cysts that may have to be treated surgically.  I advised him and his fiance that I cannot guarantee this will give him great results but it is his best option.  Reviewed potential side effects of isotretinoin including xerosis, cheilitis, hepatitis, hyperlipidemia, and severe birth defects if taken by a pregnant woman. Reviewed reports of suicidal ideation in those with a history of depression while taking isotretinoin and reports of diagnosis of inflammatory bowl disease while taking isotretinoin as well as the lack of evidence for a causal relationship between isotretinoin, depression and IBD. Patient advised to reach out with any questions or concerns. Patient advised not to share pills or donate blood while on treatment or for one month after  completing treatment.   Ipledge consent form reviewed and signed today. Patient registered in [place]. Ipledge # is [id number].  Labs reviewed from 09/2020, 11/2020, 01/2021. Triglycerides were high in 09/2020. His PCP changed his cholesterol medication but has not checked lipids since. We will plan to start Isotretinoin pending labs results.  Lipid panel - Face, chest  Return in about 5 weeks (around 05/21/2021) for Isotretinoin.  I, Ashok Cordia, CMA, am acting as scribe for Sarina Ser, MD . Documentation: I have reviewed the above documentation for accuracy and completeness, and I agree with the above.  Sarina Ser, MD

## 2021-04-16 NOTE — Patient Instructions (Signed)

## 2021-04-18 LAB — LIPID PANEL
Chol/HDL Ratio: 6.2 ratio — ABNORMAL HIGH (ref 0.0–5.0)
Cholesterol, Total: 149 mg/dL (ref 100–199)
HDL: 24 mg/dL — ABNORMAL LOW (ref >39)
LDL Chol Calc (NIH): 46 mg/dL (ref 0–99)
Triglycerides: 545 mg/dL — ABNORMAL HIGH (ref 0–149)
VLDL Cholesterol Cal: 79 mg/dL — ABNORMAL HIGH (ref 5–40)

## 2021-04-19 ENCOUNTER — Encounter: Payer: Self-pay | Admitting: Dermatology

## 2021-04-21 ENCOUNTER — Telehealth: Payer: Self-pay

## 2021-04-21 DIAGNOSIS — L700 Acne vulgaris: Secondary | ICD-10-CM

## 2021-04-21 MED ORDER — ISOTRETINOIN 20 MG PO CAPS: 20.0000 mg | ORAL_CAPSULE | Freq: Every day | ORAL | 0 refills | Status: AC

## 2021-04-21 NOTE — Addendum Note (Signed)
Addended by: Harriett Sine on: 04/21/2021 04:50 PM   Modules accepted: Orders

## 2021-04-21 NOTE — Telephone Encounter (Signed)
LM on VM please return my call  

## 2021-04-21 NOTE — Telephone Encounter (Signed)
Spoke with pt and advised of lab results. He had no concerns.   Isotretinoin 20 mg qd #30 0 rf sent to pharmacy.

## 2021-04-21 NOTE — Telephone Encounter (Signed)
-----   Message from Ralene Bathe, MD sent at 04/19/2021  3:19 PM EST ----- Lipid lab from 04/18/2011 shows significantly elevated Triglycerides.  He is on treatment for this per his PCP and recently changed medication.  Follow up with PCP.  We can still start low dose Isotretinoin and follow Lipids closely.  Send Isotretinoin 20 mg qd #30 - send in Keep 1 month fu appt

## 2021-05-22 ENCOUNTER — Ambulatory Visit (INDEPENDENT_AMBULATORY_CARE_PROVIDER_SITE_OTHER): Payer: HMO | Admitting: Dermatology

## 2021-05-22 ENCOUNTER — Other Ambulatory Visit: Payer: Self-pay

## 2021-05-22 VITALS — Wt 250.0 lb

## 2021-05-22 DIAGNOSIS — L905 Scar conditions and fibrosis of skin: Secondary | ICD-10-CM | POA: Diagnosis not present

## 2021-05-22 DIAGNOSIS — L853 Xerosis cutis: Secondary | ICD-10-CM | POA: Diagnosis not present

## 2021-05-22 DIAGNOSIS — L729 Follicular cyst of the skin and subcutaneous tissue, unspecified: Secondary | ICD-10-CM

## 2021-05-22 DIAGNOSIS — L700 Acne vulgaris: Secondary | ICD-10-CM

## 2021-05-22 DIAGNOSIS — Z79899 Other long term (current) drug therapy: Secondary | ICD-10-CM

## 2021-05-22 DIAGNOSIS — K130 Diseases of lips: Secondary | ICD-10-CM

## 2021-05-22 MED ORDER — ISOTRETINOIN 30 MG PO CAPS
30.0000 mg | ORAL_CAPSULE | Freq: Every day | ORAL | 0 refills | Status: DC
Start: 2021-05-22 — End: 2021-05-30

## 2021-05-22 NOTE — Patient Instructions (Signed)

## 2021-05-22 NOTE — Progress Notes (Unsigned)
° °  Isotretinoin Follow-Up Visit   Subjective  Darren Allen is a 56 y.o. male who presents for the following: Follow-up (Patient here today for 30 day accutane follow up. ).  Week # 4 Ipledge # [id number] [address] Total mg/kg - 5.29 mg/kg   Isotretinoin F/U - 05/22/21 1100       Isotretinoin Follow Up   iPledge # [id number]    Date 05/22/21    Weight 250 lb (113.4 kg)    Acne breakouts since last visit? No      Dosage   Target Dosage (mg) 99242    Current (To Date) Dosage (mg) 600    To Go Dosage (mg) 16065      Side Effects   Skin Dry Lips    Gastrointestinal Nausea    Neurological WNL    Constitutional WNL             Side effects: Dry skin, dry lips  Denies changes in night vision, shortness of breath, abdominal pain, nausea, vomiting, diarrhea, blood in stool or urine, visual changes, headaches, epistaxis, joint pain, myalgias, mood changes, depression, or suicidal ideation.   The following portions of the chart were reviewed this encounter and updated as appropriate: medications, allergies, medical history  Review of Systems:  No other skin or systemic complaints except as noted in HPI or Assessment and Plan.  Objective  Well appearing patient in no apparent distress; mood and affect are within normal limits.  An examination of the face, neck, chest, and back was performed and relevant findings are noted below.   face, chest Deep confluent comedones; milium and cysts of chest and cheeks with some scarring    Assessment & Plan   Acne vulgaris face, chest  Continue isotretinoin increasing to 30 mg daily  Chronic and persistent condition with duration or expected duration over one year. Condition is bothersome/symptomatic for patient. Currently flared.  Patient confirmed in iPledge and isotretinoin sent to pharmacy.   ISOtretinoin (ABSORICA) 30 MG capsule - face, chest Take 1 capsule (30 mg total) by mouth daily.    Xerosis  secondary to isotretinoin therapy - Continue emollients as directed  Cheilitis secondary to isotretinoin therapy - Continue lip balm as directed, Dr. Luvenia Heller Cortibalm recommended  Long term medication management (isotretinoin) - While taking Isotretinoin and for 30 days after you finish the medication, do not share pills, do not donate blood. Isotretinoin is best absorbed when taken with a fatty meal. Isotretinoin can make you sensitive to the sun. Daily careful sun protection including sunscreen SPF 30+ when outdoors is recommended.  Follow-up in 30 days.

## 2021-05-27 ENCOUNTER — Ambulatory Visit (INDEPENDENT_AMBULATORY_CARE_PROVIDER_SITE_OTHER): Payer: HMO

## 2021-05-27 ENCOUNTER — Other Ambulatory Visit: Payer: Self-pay

## 2021-05-27 ENCOUNTER — Ambulatory Visit
Admission: RE | Admit: 2021-05-27 | Discharge: 2021-05-27 | Disposition: A | Discharge status: Home / Self Care | Payer: HMO | Source: Ambulatory Visit

## 2021-05-27 VITALS — BP 126/78 | HR 110 | Temp 98.2°F | Resp 18

## 2021-05-27 DIAGNOSIS — M79672 Pain in left foot: Secondary | ICD-10-CM | POA: Diagnosis not present

## 2021-05-27 DIAGNOSIS — L03032 Cellulitis of left toe: Secondary | ICD-10-CM | POA: Diagnosis not present

## 2021-05-27 IMAGING — DX DG FOOT COMPLETE 3+V*L*
3 series · 3 of 3 positions shown · non-contrast
Comparison: None aside from ankle imaging from [X3].

CLINICAL DATA: A 56-year-old male presents for evaluation of
suspected foot infection, history of diabetes and previous ankle
surgery. Greatest area of pain first metatarsophalangeal joint.

EXAM:
LEFT FOOT - COMPLETE 3+ VIEW

[foot ap]
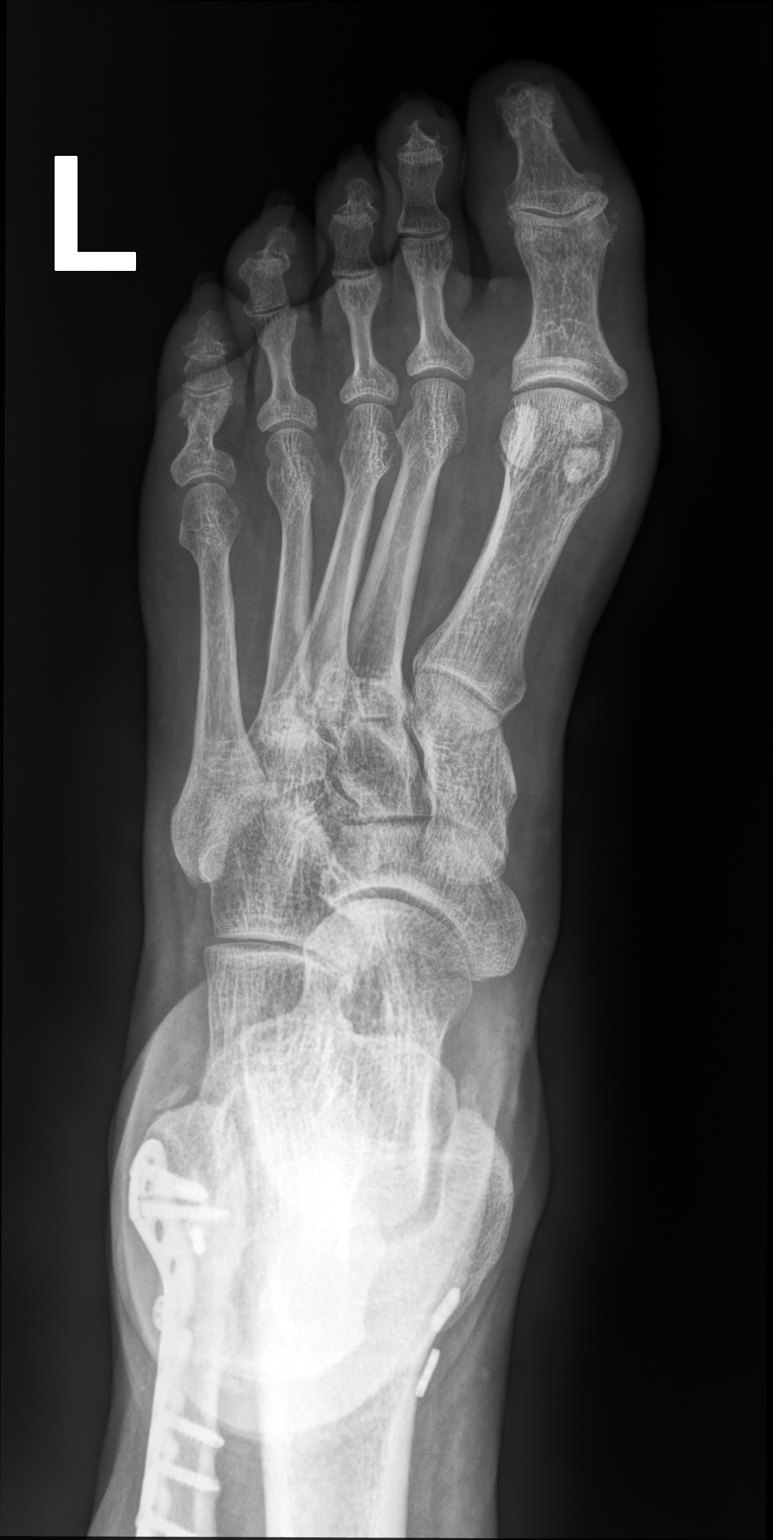

[foot mlo]
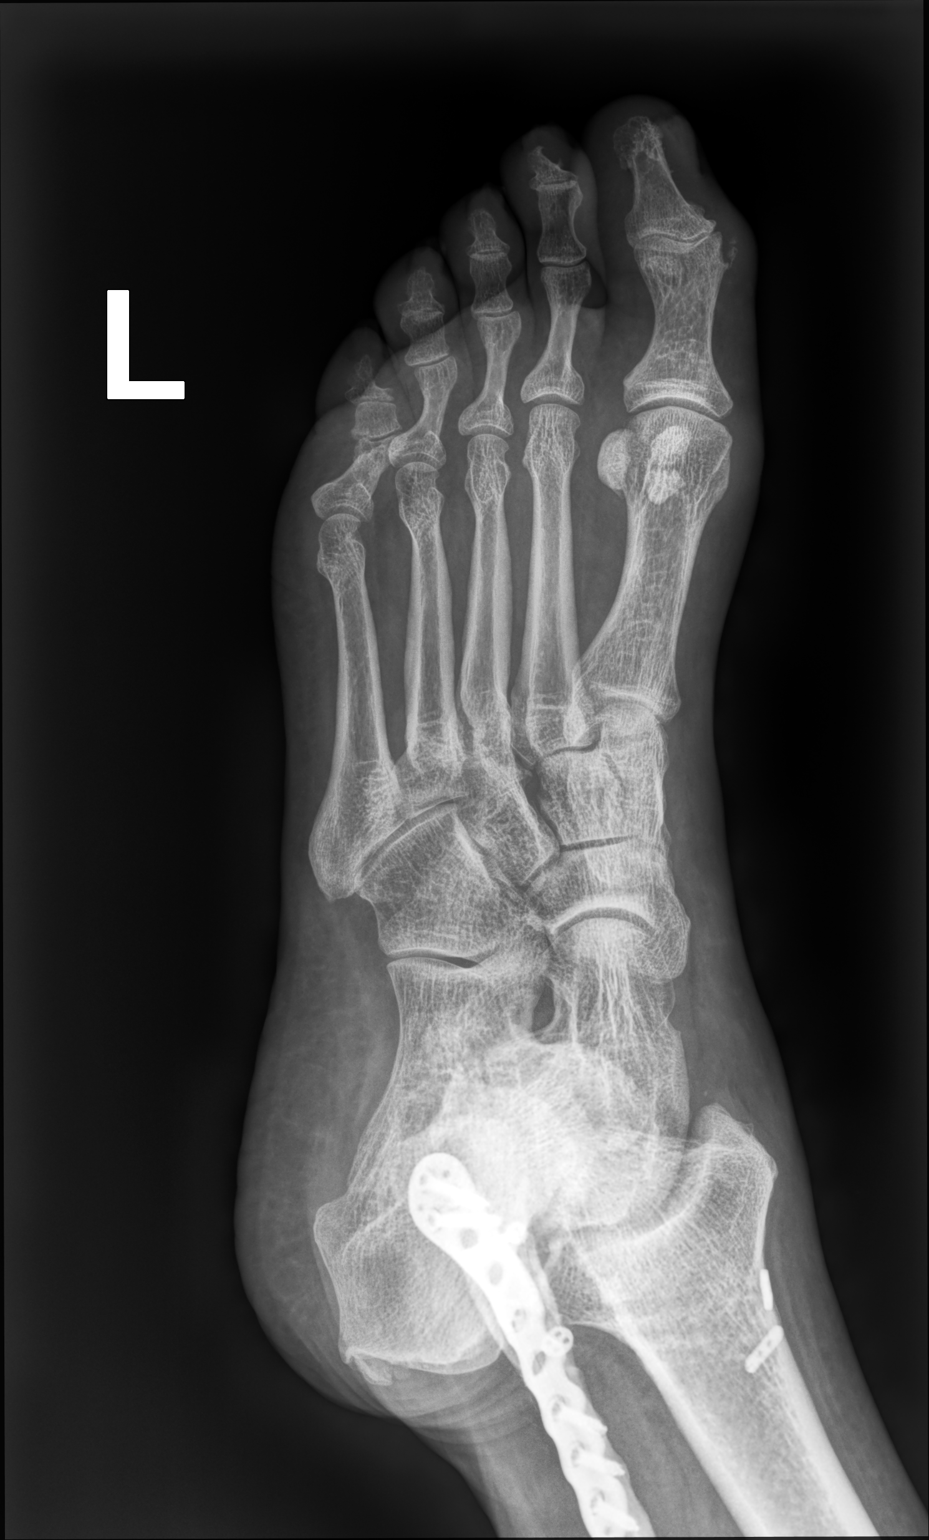

[foot lat]
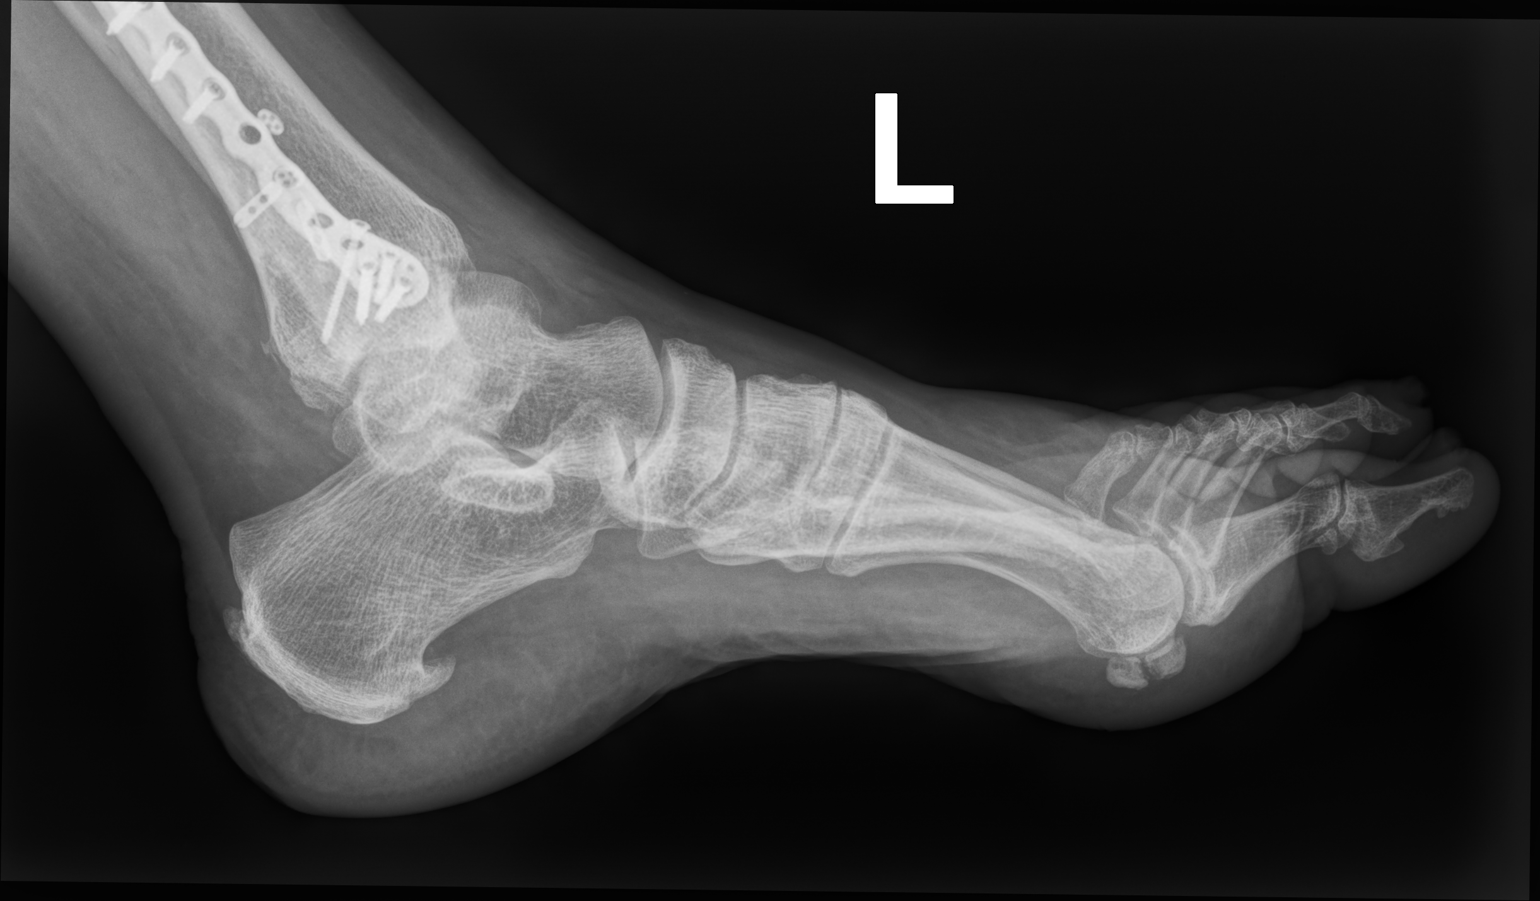

[3 of 3 positions shown; findings below may reference images not displayed]

FINDINGS: Signs of disuse osteopenia.

Erosive changes about the tip of the distal phalanx of the second
digit with overhanging, well corticated margins.

Erosion along the distal aspect of the medial distal proximal
phalanx of the great toe which is less well corticated. No signs of
acute fracture. No signs of dislocation.

Soft tissue swelling about the great toe.

Irregularity of the proximal phalanx of the fifth digit. Suggest
prior fracture of the distal aspect of the proximal phalanx of the
fifth digit.
IMPRESSION: 1. Soft tissue swelling about the great toe.
2. Erosive changes about the distal phalanx of the second digit and
also along the distal aspect of the proximal phalanx of the great
toe. Findings about the great toe raising the question of early
osteomyelitis in the setting of soft tissue swelling. Findings about
the second digit are better corticated than would be expected and
show overhanging margins. Perhaps related to prior infection given
well corticated margins. Correlate with any overlying signs of
infection currently. MRI may be helpful for further evaluation.
3. Fracture of the proximal phalanx of the fifth digit of uncertain
chronicity. Potentially subacute.

## 2021-05-27 MED ORDER — CEFTRIAXONE SODIUM 500 MG IJ SOLR
500.0000 mg | Freq: Once | INTRAMUSCULAR | Status: AC
  Administered 2021-05-27: 500 mg via INTRAMUSCULAR

## 2021-05-27 NOTE — ED Triage Notes (Signed)
Pt dropped a stepping stone on his Left Great Toe x 3 days  ?

## 2021-05-27 NOTE — ED Provider Notes (Addendum)
?[facility] ? ? ? ?CSN: [id number] ?Arrival date & time: 05/27/21  1444 ? ? ?  ? ?History   ?Chief Complaint ?Chief Complaint  ?Patient presents with  ? Toe Injury  ? ? ?HPI ?Darren Allen is a 56 y.o. male presenting with left great toe infection following dropping a stepping stone on his left great toe while he was barefoot.  History of diabetic neuropathy, uncontrolled diabetes.  Was started on Trulicity for the diabetes 2 weeks ago, his sugars have been running 170 fasting, 200s nonfasting.  States he does not have sensation in his feet, so is unsure of the pain.  Redness and swelling is getting worse.  Has been washing and applying Neosporin ointment at home.  Denies fever/chills, drainage from the wound.  Does not have a podiatrist or wound doctor. ? ?HPI ? ?Past Medical History:  ?Diagnosis Date  ? Calculus of kidney 02/04/2015  ? COPD (chronic obstructive pulmonary disease) ([place])   ? Diabetes mellitus without complication ([place])   ? type 2  ? Hypertension   ? Neuropathy   ? ? ?Patient Active Problem List  ? Diagnosis Date Noted  ? Ankle fracture 03/23/2017  ? Type 2 diabetes mellitus ([place]) 02/04/2015  ? 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 Calculus of kidney 02/04/2015  ? Perianal abscess 02/04/2015  ? Perirectal abscess   ? Chronic obstructive pulmonary disease ([place]) 01/17/2013  ? Acid reflux 01/17/2013  ? HLD (hyperlipidemia) 01/17/2013  ? Apnea, sleep 01/17/2013  ? Testicular hypofunction 01/17/2013  ? Allergic state 01/17/2013  ? Adiposity 07/06/2011  ? ? ?Past Surgical History:  ?Procedure Laterality Date  ? APPENDECTOMY    ? INCISION AND DRAINAGE PERIRECTAL ABSCESS N/A 02/04/2015  ? Procedure: IRRIGATION AND DEBRIDEMENT PERIRECTAL ABSCESS;  Surgeon: Marlyce Huge, MD;  Location: [facility] ORS;  Service: General;  Laterality: N/A;  ? ORIF ANKLE FRACTURE Left 03/23/2017  ? Procedure: OPEN REDUCTION INTERNAL FIXATION (ORIF) ANKLE FRACTURE;  Surgeon: Leim Fabry, MD;  Location: [facility] ORS;  Service:  Orthopedics;  Laterality: Left;  ? RECTAL EXAM UNDER ANESTHESIA  02/04/2015  ? Procedure: RECTAL EXAM UNDER ANESTHESIA;  Surgeon: Marlyce Huge, MD;  Location: [facility] ORS;  Service: General;;  ? SYNDESMOSIS REPAIR Left 03/23/2017  ? Procedure: SYNDESMOSIS REPAIR;  Surgeon: Leim Fabry, MD;  Location: [facility] ORS;  Service: Orthopedics;  Laterality: Left;  ? ? ? ? ? ?Home Medications   ? ?Prior to Admission medications   ?Medication Sig Start Date End Date Taking? Authorizing Provider  ?Accu-Chek Softclix Lancets lancets 3 (three) times daily. 09/30/20   [provider]  ?acetaminophen (TYLENOL) 500 MG tablet Take 2 tablets (1,000 mg total) by mouth every 8 (eight) hours. 03/24/17   Reche Dixon, PA-C  ?ascorbic acid (VITAMIN C) 500 MG tablet Take by mouth daily.    [provider]  ?BD DISP NEEDLES 22G X 1-1/2" MISC every 14 (fourteen) days. 09/09/20   [provider]  ?citalopram (CELEXA) 20 MG tablet Take 20 mg by mouth daily. 09/27/20   [provider]  ?ferrous sulfate 325 (65 FE) MG tablet Take by mouth daily.    [provider]  ?gabapentin (NEURONTIN) 600 MG tablet  10/21/20   [provider]  ?hydrochlorothiazide (HYDRODIURIL) 25 MG tablet Take 1 tablet by mouth daily. 02/26/17   [provider]  ?HYDROcodone-acetaminophen (NORCO/VICODIN) 5-325 MG tablet Take 1 tablet by mouth 2 (two) times daily as needed. 11/20/20   [provider]  ?hydrOXYzine (ATARAX/VISTARIL) 25 MG tablet Take  1 tablet by mouth 3 (three) times daily as needed for anxiety. 01/08/20   [provider]  ?insulin isophane & regular human (HUMULIN 70/30 KWIKPEN) (70-30) 100 UNIT/ML KwikPen Inject 40 Units into the skin 2 (two) times daily with a meal. 10/14/19   [provider]  ?ISOtretinoin (ABSORICA) 30 MG capsule Take 1 capsule (30 mg total) by mouth daily. 05/22/21   Ralene Bathe, MD  ?lidocaine-prilocaine (EMLA) cream Apply topically once. 07/25/20    [provider]  ?lisinopril (PRINIVIL,ZESTRIL) 40 MG tablet Take 1 tablet by mouth daily. 02/26/17   [provider]  ?metFORMIN (GLUCOPHAGE-XR) 500 MG 24 hr tablet Take 4 tablets by mouth daily. 02/26/17   [provider]  ?NEEDLE, DISP, 18 G (B-D BLUNT FILL NEEDLE) 18G X 1-1/2" MISC Use 18G needle to draw testosterone for injection 10/15/20   Billey Co, MD  ?nicotine (NICODERM CQ - DOSED IN MG/24 HOURS) 21 mg/24hr patch Place 1 patch onto the skin daily. 06/01/14   [provider]  ?pantoprazole (PROTONIX) 40 MG tablet Take 2 tablets by mouth daily. 03/03/17   [provider]  ?rosuvastatin (CRESTOR) 40 MG tablet Take 40 mg by mouth daily. 09/30/20   [provider]  ?SYRINGE-NEEDLE, DISP, 3 ML (BD ECLIPSE SYRINGE) 21G X 1" 3 ML MISC Use to administer testosterone 10/15/20   Billey Co, MD  ?tadalafil (CIALIS) 5 MG tablet Take 0.5-1 tablets (2.5-5 mg total) by mouth daily as needed for erectile dysfunction. 10/15/20   Billey Co, MD  ?testosterone cypionate (DEPOTESTOSTERONE CYPIONATE) 200 MG/ML injection Inject 1 mL (200 mg total) into the muscle every 14 (fourteen) days. 10/15/20   Billey Co, MD  ?tiZANidine (ZANAFLEX) 4 MG tablet Take 1 tablet (4 mg total) by mouth every 8 (eight) hours as needed for muscle spasms. 10/01/20   Coral Spikes, DO  ?TRELEGY ELLIPTA 200-62.5-25 MCG/INH AEPB Inhale 1 puff into the lungs daily. 10/10/20   [provider]  ?TRULICITY 1.5 ZJ/6.7HA SOPN Inject 1.5 mg into the skin once a week. 05/13/21   [provider]  ? ? ?Family History ?Family History  ?Problem Relation Age of Onset  ? Cancer Mother 46  ?     Lung  ? Cancer Father   ?     Colon  ? Heart disease Father   ? Alcohol abuse Father   ? Cancer Brother 37  ?     Esophageal  ? Diabetes Brother   ? Heart disease Brother   ? ? ?Social History ?Social History  ? ?Tobacco Use  ? Smoking status: Every Day  ?  Packs/day: 0.50  ?  Years: 34.00  ?   Pack years: 17.00  ?  Types: Cigarettes  ?  Last attempt to quit: 06/15/2019  ?  Years since quitting: 1.9  ? Smokeless tobacco: Never  ? Tobacco comments:  ?  patient using Nicoderm patches  ?Vaping Use  ? Vaping Use: Never used  ?Substance Use Topics  ? Alcohol use: Yes  ?  Alcohol/week: 12.0 standard drinks  ?  Types: 12 Cans of beer per week  ?  Comment: varies- only drinks on weekend. 01/26/20 3 beers per weekend  ? Drug use: No  ? ? ? ?Allergies   ?Glipizide and Duloxetine ? ? ?Review of Systems ?Review of Systems  ?Musculoskeletal:   ?     L great toe infection   ?All other systems reviewed and are negative. ? ? ?  Physical Exam ?Triage Vital Signs ?ED Triage Vitals  ?Enc Vitals Group  ?   BP 05/27/21 1453 126/78  ?   Pulse Rate 05/27/21 1453 (!) 110  ?   Resp 05/27/21 1453 18  ?   Temp 05/27/21 1453 98.2 ?F (36.8 ?C)  ?   Temp Source 05/27/21 1453 Oral  ?   SpO2 05/27/21 1453 95 %  ?   Weight --   ?   Height --   ?   Head Circumference --   ?   Peak Flow --   ?   Pain Score 05/27/21 1454 5  ?   Pain Loc --   ?   Pain Edu? --   ?   Excl. in [place]? --   ? ?No data found. ? ?Updated Vital Signs ?BP 126/78 (BP Location: Left Arm)   Pulse (!) 110   Temp 98.2 ?F (36.8 ?C) (Oral)   Resp 18   SpO2 95%  ? ?Visual Acuity ?Right Eye Distance:   ?Left Eye Distance:   ?Bilateral Distance:   ? ?Right Eye Near:   ?Left Eye Near:    ?Bilateral Near:    ? ?Physical Exam ?Vitals reviewed.  ?Constitutional:   ?   General: He is not in acute distress. ?   Appearance: Normal appearance. He is not ill-appearing.  ?HENT:  ?   Head: Normocephalic and atraumatic.  ?Pulmonary:  ?   Effort: Pulmonary effort is normal.  ?Skin: ?   Comments: See image below ?L great toe with erythema, warmth, and swelling, worse over the MTP joint. No tenderness (given neuropathy). No midfoot or malleolar pain. Bilateral ankles with 1+ edema at baseline, no calf swelling or tenderness or venous distension. DP is faint but palpable, cap refill <3 seconds.  No lesion on the plantar aspect of the foot.   ?Neurological:  ?   General: No focal deficit present.  ?   Mental Status: He is alert and oriented to person, place, and time.  ?Psychiatric:     ?   Mood and Affec

## 2021-05-27 NOTE — Discharge Instructions (Addendum)
-  I am concerned that you are developing osteomyelitis of your left big toe.  This is a dangerous bone infection which can be hard to treat. ?-Unfortunately, since you are taking Accutane, I cannot prescribe doxycycline or Bactrim, which would be appropriate  ?-If symptoms get worse, like the redness and swelling is getting worse, new fevers, worsening pain-head to the emergency department for IV antibiotics. ?

## 2021-05-28 ENCOUNTER — Other Ambulatory Visit: Payer: Self-pay

## 2021-05-28 ENCOUNTER — Emergency Department: Payer: HMO

## 2021-05-28 ENCOUNTER — Telehealth: Payer: Self-pay

## 2021-05-28 ENCOUNTER — Encounter: Payer: Self-pay | Admitting: Dermatology

## 2021-05-28 ENCOUNTER — Inpatient Hospital Stay
Admission: EM | Admit: 2021-05-28 | Discharge: 2021-05-30 | DRG: 603 | Disposition: A | Discharge status: Home / Self Care | Payer: HMO | Attending: Internal Medicine | Admitting: Internal Medicine

## 2021-05-28 ENCOUNTER — Encounter: Payer: Self-pay | Admitting: Intensive Care

## 2021-05-28 DIAGNOSIS — J4489 Other specified chronic obstructive pulmonary disease: Secondary | ICD-10-CM | POA: Diagnosis present

## 2021-05-28 DIAGNOSIS — I10 Essential (primary) hypertension: Secondary | ICD-10-CM | POA: Diagnosis present

## 2021-05-28 DIAGNOSIS — Z20822 Contact with and (suspected) exposure to covid-19: Secondary | ICD-10-CM | POA: Diagnosis present

## 2021-05-28 DIAGNOSIS — Z7984 Long term (current) use of oral hypoglycemic drugs: Secondary | ICD-10-CM | POA: Diagnosis not present

## 2021-05-28 DIAGNOSIS — T148XXA Other injury of unspecified body region, initial encounter: Secondary | ICD-10-CM | POA: Diagnosis not present

## 2021-05-28 DIAGNOSIS — Z800 Family history of malignant neoplasm of digestive organs: Secondary | ICD-10-CM | POA: Diagnosis not present

## 2021-05-28 DIAGNOSIS — K219 Gastro-esophageal reflux disease without esophagitis: Secondary | ICD-10-CM | POA: Diagnosis present

## 2021-05-28 DIAGNOSIS — J449 Chronic obstructive pulmonary disease, unspecified: Secondary | ICD-10-CM | POA: Diagnosis present

## 2021-05-28 DIAGNOSIS — E1142 Type 2 diabetes mellitus with diabetic polyneuropathy: Secondary | ICD-10-CM | POA: Diagnosis present

## 2021-05-28 DIAGNOSIS — F17210 Nicotine dependence, cigarettes, uncomplicated: Secondary | ICD-10-CM | POA: Diagnosis present

## 2021-05-28 DIAGNOSIS — Z8249 Family history of ischemic heart disease and other diseases of the circulatory system: Secondary | ICD-10-CM

## 2021-05-28 DIAGNOSIS — E1159 Type 2 diabetes mellitus with other circulatory complications: Secondary | ICD-10-CM

## 2021-05-28 DIAGNOSIS — L089 Local infection of the skin and subcutaneous tissue, unspecified: Secondary | ICD-10-CM

## 2021-05-28 DIAGNOSIS — Z794 Long term (current) use of insulin: Secondary | ICD-10-CM | POA: Diagnosis not present

## 2021-05-28 DIAGNOSIS — S91102A Unspecified open wound of left great toe without damage to nail, initial encounter: Secondary | ICD-10-CM | POA: Diagnosis present

## 2021-05-28 DIAGNOSIS — E11621 Type 2 diabetes mellitus with foot ulcer: Secondary | ICD-10-CM | POA: Diagnosis present

## 2021-05-28 DIAGNOSIS — E669 Obesity, unspecified: Secondary | ICD-10-CM | POA: Diagnosis present

## 2021-05-28 DIAGNOSIS — W228XXA Striking against or struck by other objects, initial encounter: Secondary | ICD-10-CM | POA: Diagnosis present

## 2021-05-28 DIAGNOSIS — Z833 Family history of diabetes mellitus: Secondary | ICD-10-CM | POA: Diagnosis not present

## 2021-05-28 DIAGNOSIS — Z6832 Body mass index (BMI) 32.0-32.9, adult: Secondary | ICD-10-CM

## 2021-05-28 DIAGNOSIS — E1151 Type 2 diabetes mellitus with diabetic peripheral angiopathy without gangrene: Secondary | ICD-10-CM | POA: Diagnosis present

## 2021-05-28 DIAGNOSIS — Z7985 Long-term (current) use of injectable non-insulin antidiabetic drugs: Secondary | ICD-10-CM

## 2021-05-28 DIAGNOSIS — L03116 Cellulitis of left lower limb: Principal | ICD-10-CM | POA: Diagnosis present

## 2021-05-28 DIAGNOSIS — L97529 Non-pressure chronic ulcer of other part of left foot with unspecified severity: Secondary | ICD-10-CM | POA: Diagnosis present

## 2021-05-28 DIAGNOSIS — Z801 Family history of malignant neoplasm of trachea, bronchus and lung: Secondary | ICD-10-CM | POA: Diagnosis not present

## 2021-05-28 DIAGNOSIS — Z811 Family history of alcohol abuse and dependence: Secondary | ICD-10-CM | POA: Diagnosis not present

## 2021-05-28 DIAGNOSIS — Z888 Allergy status to other drugs, medicaments and biological substances status: Secondary | ICD-10-CM | POA: Diagnosis not present

## 2021-05-28 DIAGNOSIS — I152 Hypertension secondary to endocrine disorders: Secondary | ICD-10-CM

## 2021-05-28 DIAGNOSIS — G629 Polyneuropathy, unspecified: Secondary | ICD-10-CM | POA: Insufficient documentation

## 2021-05-28 DIAGNOSIS — Z79899 Other long term (current) drug therapy: Secondary | ICD-10-CM

## 2021-05-28 DIAGNOSIS — E1140 Type 2 diabetes mellitus with diabetic neuropathy, unspecified: Secondary | ICD-10-CM | POA: Diagnosis present

## 2021-05-28 DIAGNOSIS — I739 Peripheral vascular disease, unspecified: Secondary | ICD-10-CM

## 2021-05-28 DIAGNOSIS — Z87442 Personal history of urinary calculi: Secondary | ICD-10-CM

## 2021-05-28 DIAGNOSIS — M109 Gout, unspecified: Secondary | ICD-10-CM | POA: Diagnosis present

## 2021-05-28 LAB — CBC WITH DIFFERENTIAL/PLATELET
Abs Immature Granulocytes: 0.02 10*3/uL (ref 0.00–0.07)
Basophils Absolute: 0.1 10*3/uL (ref 0.0–0.1)
Basophils Relative: 1 %
Eosinophils Absolute: 0.4 10*3/uL (ref 0.0–0.5)
Eosinophils Relative: 5 %
HCT: 44.5 % (ref 39.0–52.0)
Hemoglobin: 13.8 g/dL (ref 13.0–17.0)
Immature Granulocytes: 0 %
Lymphocytes Relative: 20 %
Lymphs Abs: 1.6 10*3/uL (ref 0.7–4.0)
MCH: 26.8 pg (ref 26.0–34.0)
MCHC: 31 g/dL (ref 30.0–36.0)
MCV: 86.6 fL (ref 80.0–100.0)
Monocytes Absolute: 0.9 10*3/uL (ref 0.1–1.0)
Monocytes Relative: 12 %
Neutro Abs: 5.1 10*3/uL (ref 1.7–7.7)
Neutrophils Relative %: 62 %
Platelets: 221 10*3/uL (ref 150–400)
RBC: 5.14 MIL/uL (ref 4.22–5.81)
RDW: 15.1 % (ref 11.5–15.5)
WBC: 8.1 10*3/uL (ref 4.0–10.5)
nRBC: 0 % (ref 0.0–0.2)

## 2021-05-28 LAB — COMPREHENSIVE METABOLIC PANEL
ALT: 33 U/L (ref 0–44)
AST: 38 U/L (ref 15–41)
Albumin: 4.1 g/dL (ref 3.5–5.0)
Alkaline Phosphatase: 62 U/L (ref 38–126)
Anion gap: 8 (ref 5–15)
BUN: 16 mg/dL (ref 6–20)
CO2: 24 mmol/L (ref 22–32)
Calcium: 9.7 mg/dL (ref 8.9–10.3)
Chloride: 104 mmol/L (ref 98–111)
Creatinine, Ser: 1.15 mg/dL (ref 0.61–1.24)
GFR, Estimated: >60 mL/min (ref >60)
Glucose, Bld: 160 mg/dL — ABNORMAL HIGH (ref 70–99)
Potassium: 3.9 mmol/L (ref 3.5–5.1)
Sodium: 136 mmol/L (ref 135–145)
Total Bilirubin: 0.8 mg/dL (ref 0.3–1.2)
Total Protein: 7.7 g/dL (ref 6.5–8.1)

## 2021-05-28 LAB — RESP PANEL BY RT-PCR (FLU A&B, COVID) ARPGX2
Influenza A by PCR: NEGATIVE
Influenza B by PCR: NEGATIVE
SARS Coronavirus 2 by RT PCR: NEGATIVE

## 2021-05-28 LAB — LACTIC ACID, PLASMA: Lactic Acid, Venous: 1.7 mmol/L (ref 0.5–1.9)

## 2021-05-28 LAB — GLUCOSE, CAPILLARY: Glucose-Capillary: 199 mg/dL — ABNORMAL HIGH (ref 70–99)

## 2021-05-28 LAB — URIC ACID: Uric Acid, Serum: 6.8 mg/dL (ref 3.7–8.6)

## 2021-05-28 IMAGING — MR MR FOOT*L* WO/W CM
13 series · 40 of 40 positions shown · IV contrast (gadavist)
Comparison: Left foot x-rays from yesterday.

CLINICAL DATA: Left foot pain, redness, and swelling since dropping
a brick on it few days ago.

EXAM:
MRI OF THE LEFT FOREFOOT WITHOUT AND WITH CONTRAST
TECHNIQUE: Multiplanar, multisequence MR imaging of the left forefoot was
performed both before and after administration of intravenous
contrast.
CONTRAST:  10mL GADAVIST GADOBUTROL 1 MMOL/ML IV SOLN

[Series 4: T2 · coronal · left · 3.0mm · 0.38mm/px · 3 of 45 slices shown]
[im 1/45]
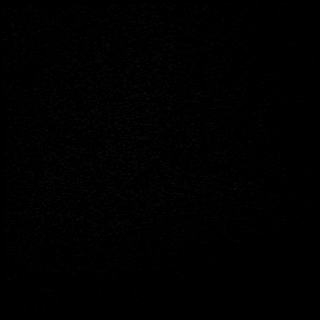
[im 23/45]
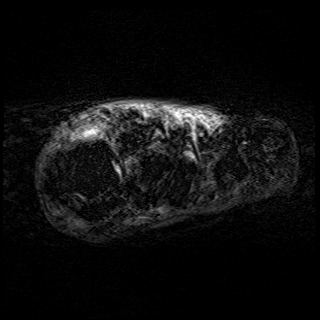
[im 45/45]
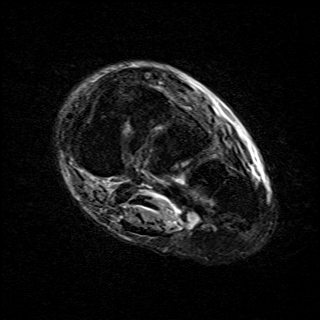

[Series 5: ax t1_in · coronal · left · 3.0mm · 0.38mm/px · 3 of 45 slices shown (1 of 2)]
[im 1/45]
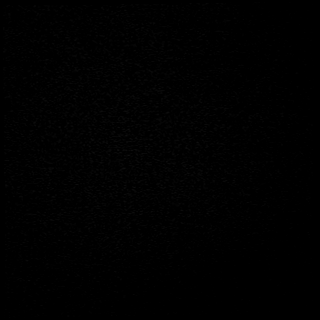
[im 23/45]
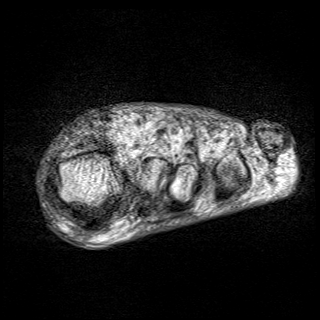
[im 45/45]
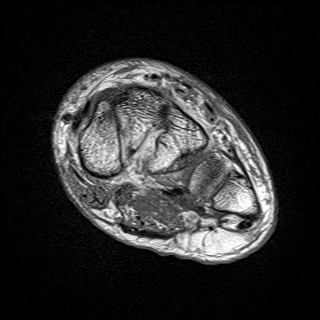

[Series 7: ax t1_w · coronal · left · 3.0mm · 0.38mm/px · 3 of 45 slices shown (1 of 2)]
[im 1/45]
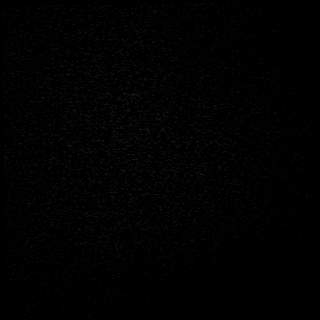
[im 23/45]
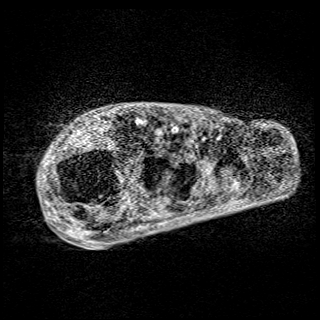
[im 45/45]
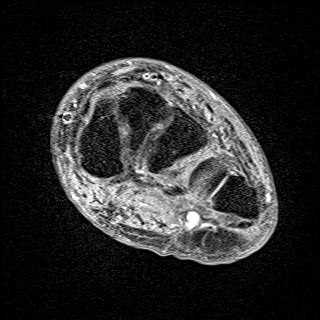

[Series 8: T2 fat-sat · coronal · left · 3.0mm · 0.59mm/px · 3 of 45 slices shown (1 of 2)]
[im 1/45]
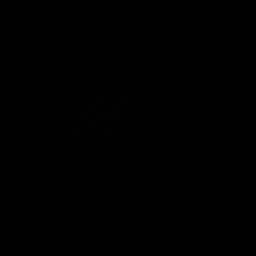
[im 23/45]
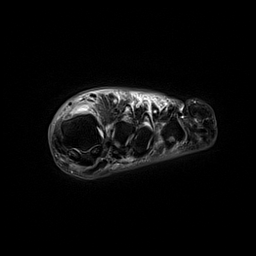
[im 45/45]
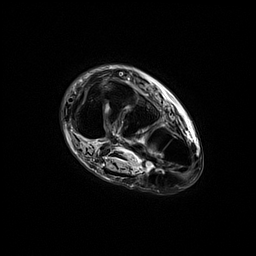

[Series 9: ax t1_in · coronal · left · 3.0mm · 0.47mm/px · 4 of 45 slices shown (2 of 2)]
[im 1/45]
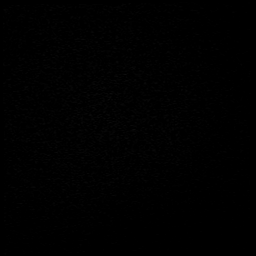
[im 15/45]
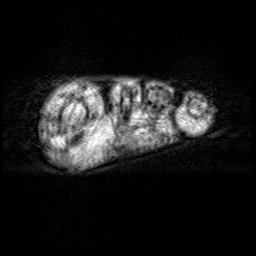
[im 30/45]
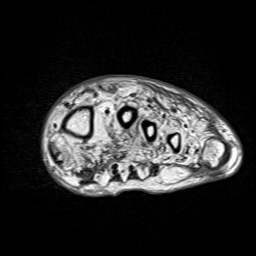
[im 45/45]
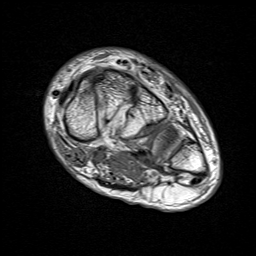

[Series 11: ax t1_w · coronal · left · 3.0mm · 0.47mm/px · 4 of 45 slices shown (2 of 2)]
[im 1/45]
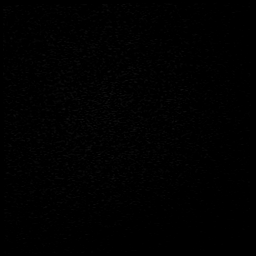
[im 15/45]
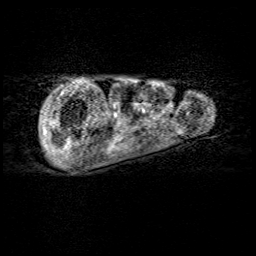
[im 30/45]
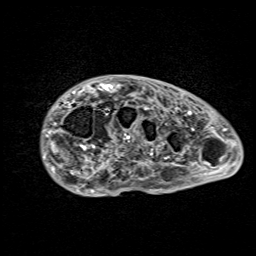
[im 45/45]
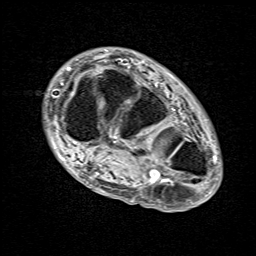

[Series 12: T1 · axial · left · 3.0mm · 0.78mm/px · z∈[-99,-31]mm · 2 of 20 slices shown]
[im 1/20]
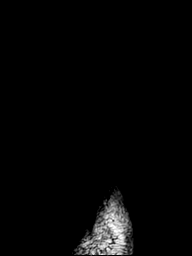
[im 20/20]
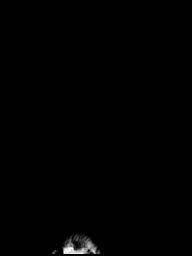

[Series 13: T2 fat-sat · axial · left · 3.0mm · 0.78mm/px · z∈[-95,-27]mm · 2 of 20 slices shown (2 of 2)]
[im 1/20]
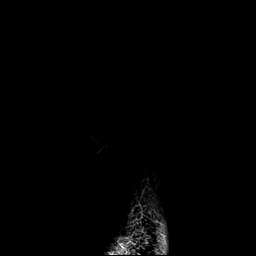
[im 20/20]
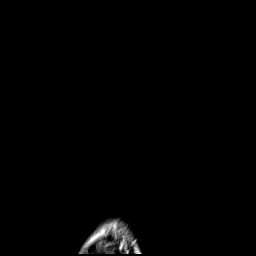

[Series 14: STIR · sagittal · left · 3.0mm · 0.62mm/px · 3 of 33 slices shown]
[im 1/33]
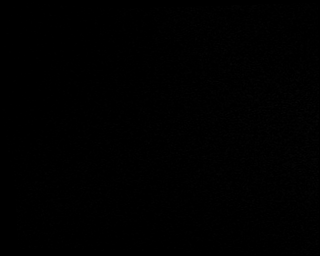
[im 17/33]
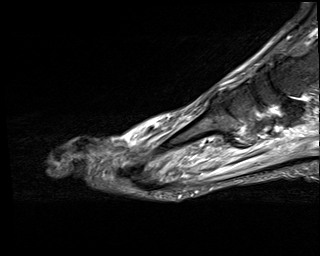
[im 33/33]
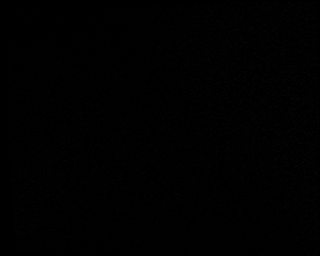

[Series 16: T1 fat-sat · coronal · left · 3.0mm · 0.47mm/px · 4 of 45 slices shown (1 of 4)]
[im 1/45]
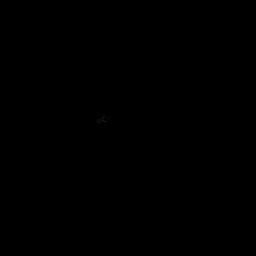
[im 15/45]
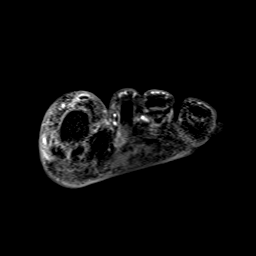
[im 30/45]
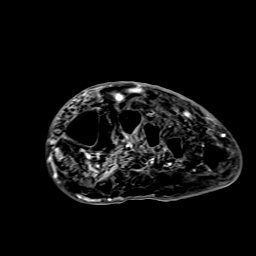
[im 45/45]
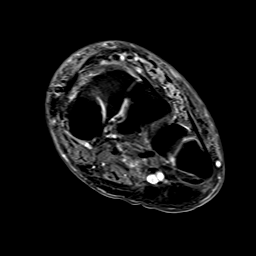

[Series 17: T1 fat-sat · axial · left · 3.0mm · 0.78mm/px · z∈[-99,-31]mm · 2 of 19 slices shown (2 of 4)]
[im 1/19]
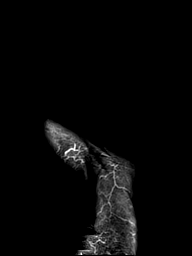
[im 19/19]
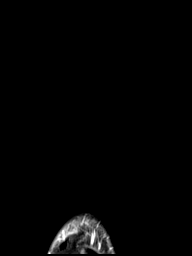

[Series 18: T1 fat-sat · sagittal · left · 3.0mm · 0.62mm/px · 3 of 32 slices shown (3 of 4)]
[im 1/32]
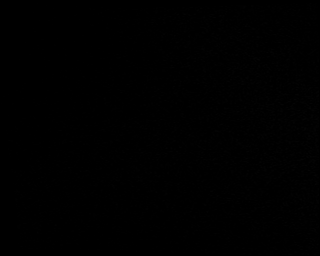
[im 16/32]
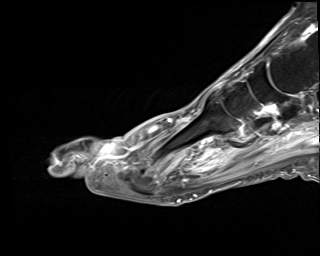
[im 32/32]
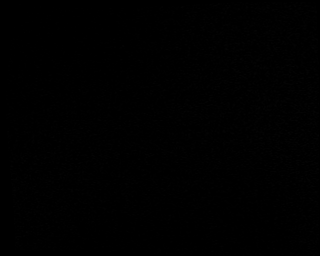

[Series 20: T1 fat-sat · coronal · left · 3.0mm · 0.47mm/px · 4 of 45 slices shown (4 of 4)]
[im 1/45]
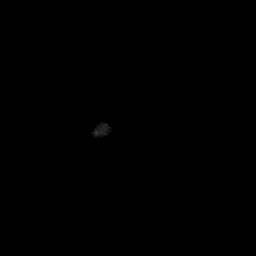
[im 15/45]
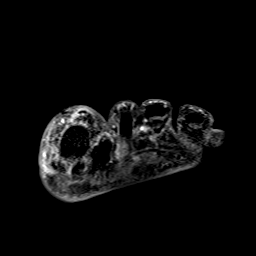
[im 30/45]
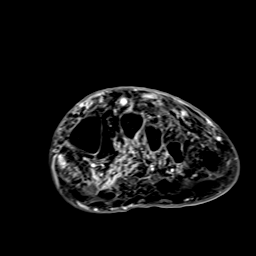
[im 45/45]
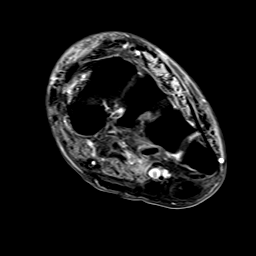

[40 of 40 positions shown; findings below may reference images not displayed]

FINDINGS: Bones/Joint/Cartilage

Small juxta-articular erosion involving the medial first proximal
phalanx head with associated small first IP joint effusion and
synovitis. Additional small first MTP joint effusion with enhancing
synovitis. No fracture or dislocation. Mild osteoarthritis of the
navicular-medial cuneiform joint and second and third TMT joints.

Ligaments

Collateral ligaments are intact.

Muscles and Tendons
Flexor and extensor tendons are intact. Severe atrophy of the
intrinsic foot muscles.

Soft tissue
Enhancing dorsal forefoot soft tissue swelling and skin thickening.
No fluid collection or hematoma. No soft tissue mass.
IMPRESSION: 1. Cellulitis of the dorsal forefoot without abscess or
osteomyelitis.
2. Small juxta-articular erosion involving the medial first proximal
phalanx head with associated small first IP joint effusion and
synovitis. Additional small first MTP joint effusion with synovitis.
Findings are suspicious for gout.

## 2021-05-28 MED ORDER — ONDANSETRON HCL 4 MG/2ML IJ SOLN
4.0000 mg | Freq: Once | INTRAMUSCULAR | Status: AC
  Administered 2021-05-28: 4 mg via INTRAVENOUS
  Filled 2021-05-28: qty 2

## 2021-05-28 MED ORDER — COLCHICINE 0.6 MG PO TABS
0.6000 mg | ORAL_TABLET | Freq: Every day | ORAL | Status: DC
  Administered 2021-05-29 – 2021-05-30 (×2): 0.6 mg via ORAL
  Filled 2021-05-28 (×2): qty 1

## 2021-05-28 MED ORDER — MORPHINE SULFATE (PF) 4 MG/ML IV SOLN
4.0000 mg | Freq: Once | INTRAVENOUS | Status: AC
  Administered 2021-05-28: 4 mg via INTRAVENOUS
  Filled 2021-05-28: qty 1

## 2021-05-28 MED ORDER — SODIUM CHLORIDE 0.9 % IV SOLN
1.0000 g | INTRAVENOUS | Status: DC
  Administered 2021-05-28 – 2021-05-29 (×2): 1 g via INTRAVENOUS
  Filled 2021-05-28: qty 1
  Filled 2021-05-28: qty 10

## 2021-05-28 MED ORDER — AMITRIPTYLINE HCL 10 MG PO TABS
30.0000 mg | ORAL_TABLET | Freq: Every day | ORAL | Status: DC
  Administered 2021-05-29: 30 mg via ORAL
  Filled 2021-05-28 (×2): qty 3

## 2021-05-28 MED ORDER — ACETAMINOPHEN 650 MG RE SUPP
650.0000 mg | Freq: Four times a day (QID) | RECTAL | Status: DC | PRN
Start: 2021-05-28 — End: 2021-05-30

## 2021-05-28 MED ORDER — ACETAMINOPHEN 325 MG PO TABS: 650.0000 mg | ORAL_TABLET | Freq: Four times a day (QID) | ORAL | Status: DC | PRN

## 2021-05-28 MED ORDER — COLCHICINE 0.6 MG PO TABS
0.6000 mg | ORAL_TABLET | ORAL | Status: DC | PRN
  Filled 2021-05-28: qty 1

## 2021-05-28 MED ORDER — LISINOPRIL 20 MG PO TABS
40.0000 mg | ORAL_TABLET | Freq: Every day | ORAL | Status: DC
  Administered 2021-05-29 – 2021-05-30 (×2): 40 mg via ORAL
  Filled 2021-05-28 (×2): qty 2

## 2021-05-28 MED ORDER — VANCOMYCIN HCL IN DEXTROSE 1-5 GM/200ML-% IV SOLN
1000.0000 mg | Freq: Two times a day (BID) | INTRAVENOUS | Status: DC
  Administered 2021-05-29 – 2021-05-30 (×3): 1000 mg via INTRAVENOUS
  Filled 2021-05-28 (×5): qty 200

## 2021-05-28 MED ORDER — ONDANSETRON HCL 4 MG PO TABS: 4.0000 mg | ORAL_TABLET | Freq: Four times a day (QID) | ORAL | Status: DC | PRN

## 2021-05-28 MED ORDER — COLCHICINE 0.6 MG PO TABS
1.2000 mg | ORAL_TABLET | ORAL | Status: AC
  Administered 2021-05-28: 1.2 mg via ORAL
  Filled 2021-05-28: qty 2

## 2021-05-28 MED ORDER — GABAPENTIN 600 MG PO TABS
600.0000 mg | ORAL_TABLET | Freq: Three times a day (TID) | ORAL | Status: DC
  Administered 2021-05-28 – 2021-05-30 (×5): 600 mg via ORAL
  Filled 2021-05-28 (×5): qty 1

## 2021-05-28 MED ORDER — MORPHINE SULFATE (PF) 2 MG/ML IV SOLN
2.0000 mg | INTRAVENOUS | Status: DC | PRN
  Administered 2021-05-29: 2 mg via INTRAVENOUS
  Filled 2021-05-28: qty 1

## 2021-05-28 MED ORDER — ONDANSETRON HCL 4 MG/2ML IJ SOLN: 4.0000 mg | Freq: Four times a day (QID) | INTRAMUSCULAR | Status: DC | PRN

## 2021-05-28 MED ORDER — CITALOPRAM HYDROBROMIDE 10 MG PO TABS
20.0000 mg | ORAL_TABLET | Freq: Every day | ORAL | Status: DC
  Filled 2021-05-28 (×2): qty 2

## 2021-05-28 MED ORDER — ALBUTEROL SULFATE (2.5 MG/3ML) 0.083% IN NEBU: 2.5000 mg | INHALATION_SOLUTION | RESPIRATORY_TRACT | Status: DC | PRN

## 2021-05-28 MED ORDER — METFORMIN HCL ER 500 MG PO TB24
1000.0000 mg | ORAL_TABLET | Freq: Every day | ORAL | Status: DC
Start: 2021-05-29 — End: 2021-05-30
  Administered 2021-05-29 – 2021-05-30 (×2): 1000 mg via ORAL
  Filled 2021-05-28 (×2): qty 2

## 2021-05-28 MED ORDER — SODIUM CHLORIDE 0.9 % IV SOLN
INTRAVENOUS | Status: DC | PRN
  Administered 2021-05-28: 10 mL/h via INTRAVENOUS

## 2021-05-28 MED ORDER — ENOXAPARIN SODIUM 60 MG/0.6ML IJ SOSY
0.5000 mg/kg | PREFILLED_SYRINGE | INTRAMUSCULAR | Status: DC
  Administered 2021-05-28 – 2021-05-29 (×2): 57.5 mg via SUBCUTANEOUS
  Filled 2021-05-28 (×2): qty 0.6

## 2021-05-28 MED ORDER — HYDROCODONE-ACETAMINOPHEN 5-325 MG PO TABS
1.0000 | ORAL_TABLET | Freq: Once | ORAL | Status: AC
  Administered 2021-05-28: 1 via ORAL
  Filled 2021-05-28: qty 1

## 2021-05-28 MED ORDER — HYDROCHLOROTHIAZIDE 25 MG PO TABS
25.0000 mg | ORAL_TABLET | Freq: Every day | ORAL | Status: DC
  Administered 2021-05-29 – 2021-05-30 (×2): 25 mg via ORAL
  Filled 2021-05-28 (×2): qty 1

## 2021-05-28 MED ORDER — INSULIN ASPART PROT & ASPART (70-30 MIX) 100 UNIT/ML ~~LOC~~ SUSP
20.0000 [IU] | Freq: Two times a day (BID) | SUBCUTANEOUS | Status: DC
  Administered 2021-05-29 – 2021-05-30 (×3): 20 [IU] via SUBCUTANEOUS
  Filled 2021-05-28: qty 10

## 2021-05-28 MED ORDER — HYDROCODONE-ACETAMINOPHEN 5-325 MG PO TABS
1.0000 | ORAL_TABLET | Freq: Two times a day (BID) | ORAL | Status: DC | PRN
Start: 2021-05-28 — End: 2021-05-30
  Administered 2021-05-29 – 2021-05-30 (×3): 1 via ORAL
  Filled 2021-05-28 (×4): qty 1

## 2021-05-28 MED ORDER — INSULIN ASPART 100 UNIT/ML IJ SOLN: 0.0000 [IU] | Freq: Every day | INTRAMUSCULAR | Status: DC

## 2021-05-28 MED ORDER — VANCOMYCIN HCL 2000 MG/400ML IV SOLN
2000.0000 mg | Freq: Once | INTRAVENOUS | Status: AC
  Administered 2021-05-28: 2000 mg via INTRAVENOUS
  Filled 2021-05-28: qty 400

## 2021-05-28 MED ORDER — GADOBUTROL 1 MMOL/ML IV SOLN
10.0000 mL | Freq: Once | INTRAVENOUS | Status: AC | PRN
  Administered 2021-05-28: 10 mL via INTRAVENOUS

## 2021-05-28 MED ORDER — INSULIN ISOPHANE & REGULAR (HUMAN 70-30)100 UNIT/ML KWIKPEN: 42.0000 [IU] | PEN_INJECTOR | Freq: Two times a day (BID) | SUBCUTANEOUS | Status: DC

## 2021-05-28 MED ORDER — INSULIN ASPART 100 UNIT/ML IJ SOLN
0.0000 [IU] | Freq: Three times a day (TID) | INTRAMUSCULAR | Status: DC
  Administered 2021-05-29 – 2021-05-30 (×4): 3 [IU] via SUBCUTANEOUS
  Filled 2021-05-28 (×4): qty 1

## 2021-05-28 MED ORDER — ROSUVASTATIN CALCIUM 10 MG PO TABS
40.0000 mg | ORAL_TABLET | Freq: Every day | ORAL | Status: DC
  Administered 2021-05-28 – 2021-05-30 (×3): 40 mg via ORAL
  Filled 2021-05-28 (×3): qty 4

## 2021-05-28 MED ORDER — PANTOPRAZOLE SODIUM 40 MG PO TBEC
80.0000 mg | DELAYED_RELEASE_TABLET | Freq: Every day | ORAL | Status: DC
  Administered 2021-05-29 – 2021-05-30 (×2): 80 mg via ORAL
  Filled 2021-05-28 (×2): qty 2

## 2021-05-28 MED ORDER — FERROUS SULFATE 325 (65 FE) MG PO TABS
325.0000 mg | ORAL_TABLET | Freq: Every day | ORAL | Status: DC
Start: 2021-05-29 — End: 2021-05-30
  Administered 2021-05-29 – 2021-05-30 (×2): 325 mg via ORAL
  Filled 2021-05-28 (×2): qty 1

## 2021-05-28 NOTE — Assessment & Plan Note (Addendum)
Continue amitriptyline, citalopram and gabapentin. ?

## 2021-05-28 NOTE — ED Notes (Signed)
Pt resting comfortably in bed, requesting pain medication for throbbing pain in foot. MD notified. NAD noted, resps even regular and nonlabored. ?

## 2021-05-28 NOTE — H&P (Addendum)
?History and Physical  ? ? ?Patient: Darren Allen YWV:[id number] DOB: October 08, 1965 ?DOA: 05/28/2021 ?DOS: the patient was seen and examined on 05/28/2021 ?PCP: Darren Castle, MD  ?Patient coming from: Home ? ?Chief Complaint:  ?Chief Complaint  ?Patient presents with  ? Wound Infection  ? ?HPI: Darren Allen is a 56 y.o. male with medical history significant of Diabetes, hypertension, COPD, GERD, diabetic neuropathy, sent from podiatry for inpatient antibiotic treatment of an infected wound of the left great toe leading to cellulitis.  Patient sustained a wound on the dorsal aspect of the left great toe when a brick fell on it and the toe has been painful, red and swelling since.  He has had no fevers or chills.  In the podiatry outpatient office, pulses were nonpalpable. ?ED course: Mild tachycardia to 106 on arrival with otherwise normal vitals.  Blood work reviewed and unremarkable with noted WBC 8.1, lactic acid 1.7.  Uric acid 6.8.  MRI showed cellulitis of the dorsal forefoot without abscess or osteomyelitis and findings suspicious for gout.  Patient started on Rocephin and vancomycin.  Hospitalist consulted for admission.  ?Review of Systems: As mentioned in the history of present illness. All other systems reviewed and are negative. ?Past Medical History:  ?Diagnosis Date  ? Calculus of kidney 02/04/2015  ? COPD (chronic obstructive pulmonary disease) ([place])   ? Diabetes mellitus without complication ([place])   ? type 2  ? Hypertension   ? Neuropathy   ? ?Past Surgical History:  ?Procedure Laterality Date  ? APPENDECTOMY    ? INCISION AND DRAINAGE PERIRECTAL ABSCESS N/A 02/04/2015  ? Procedure: IRRIGATION AND DEBRIDEMENT PERIRECTAL ABSCESS;  Surgeon: Darren Huge, MD;  Location: [facility] ORS;  Service: General;  Laterality: N/A;  ? ORIF ANKLE FRACTURE Left 03/23/2017  ? Procedure: OPEN REDUCTION INTERNAL FIXATION (ORIF) ANKLE FRACTURE;  Surgeon: Darren Fabry, MD;  Location: [facility] ORS;  Service:  Orthopedics;  Laterality: Left;  ? RECTAL EXAM UNDER ANESTHESIA  02/04/2015  ? Procedure: RECTAL EXAM UNDER ANESTHESIA;  Surgeon: Darren Huge, MD;  Location: [facility] ORS;  Service: General;;  ? SYNDESMOSIS REPAIR Left 03/23/2017  ? Procedure: SYNDESMOSIS REPAIR;  Surgeon: Darren Fabry, MD;  Location: [facility] ORS;  Service: Orthopedics;  Laterality: Left;  ? ?Social History:  reports that he has been smoking cigarettes. He has a 17.00 pack-year smoking history. He has never used smokeless tobacco. He reports that he does not currently use alcohol after a past usage of about 12.0 standard drinks per week. He reports that he does not use drugs. ? ?Allergies  ?Allergen Reactions  ? Glipizide Other (See Comments) and Palpitations  ?  Shaky, feel bad ?Other reaction(s): Dizziness  ? Duloxetine Anxiety and Nausea Only  ? ? ?Family History  ?Problem Relation Age of Onset  ? Cancer Mother 57  ?     Lung  ? Cancer Father   ?     Colon  ? Heart disease Father   ? Alcohol abuse Father   ? Cancer Brother 33  ?     Esophageal  ? Diabetes Brother   ? Heart disease Brother   ? ? ?Prior to Admission medications   ?Medication Sig Start Date End Date Taking? Authorizing Provider  ?amitriptyline (ELAVIL) 10 MG tablet Take 30 mg by mouth at bedtime.   Yes [provider]  ?ascorbic acid (VITAMIN C) 500 MG tablet Take by mouth daily.   Yes [provider]  ?ferrous sulfate 325 (65 FE) MG  tablet Take 325 mg by mouth daily with breakfast.   Yes [provider]  ?gabapentin (NEURONTIN) 600 MG tablet Take 600 mg by mouth 3 (three) times daily. 03/26/21  Yes [provider]  ?hydrochlorothiazide (HYDRODIURIL) 25 MG tablet Take 25 mg by mouth daily. 02/26/17  Yes [provider]  ?HYDROcodone-acetaminophen (NORCO/VICODIN) 5-325 MG tablet Take 1 tablet by mouth 2 (two) times daily as needed. 11/20/20  Yes [provider]  ?insulin isophane & regular human (HUMULIN 70/30 KWIKPEN) (70-30) 100  UNIT/ML KwikPen Inject 42-48 Units into the skin 2 (two) times daily with a meal. 48 units every morning and 42 units every evening 10/14/19  Yes [provider]  ?ISOtretinoin (ABSORICA) 30 MG capsule Take 1 capsule (30 mg total) by mouth daily. 05/22/21  Yes Darren Bathe, MD  ?lisinopril (PRINIVIL,ZESTRIL) 40 MG tablet Take 40 mg by mouth daily. 02/26/17  Yes [provider]  ?metFORMIN (GLUCOPHAGE-XR) 500 MG 24 hr tablet Take 2 tablets by mouth daily. 02/26/17  Yes [provider]  ?pantoprazole (PROTONIX) 40 MG tablet Take 80 mg by mouth daily. 03/03/17  Yes [provider]  ?rosuvastatin (CRESTOR) 40 MG tablet Take 40 mg by mouth daily. 09/30/20  Yes [provider]  ?Donnal Debar 200-62.5-25 MCG/INH AEPB Inhale 1 puff into the lungs daily. 10/10/20  Yes [provider]  ?Accu-Chek Softclix Lancets lancets 3 (three) times daily. 09/30/20   [provider]  ?acetaminophen (TYLENOL) 500 MG tablet Take 2 tablets (1,000 mg total) by mouth every 8 (eight) hours. 03/24/17   Darren Dixon, PA-C  ?BD DISP NEEDLES 22G X 1-1/2" MISC every 14 (fourteen) days. 09/09/20   [provider]  ?citalopram (CELEXA) 20 MG tablet Take 20 mg by mouth daily. 09/27/20   [provider]  ?levalbuterol Penne Lash HFA) 45 MCG/ACT inhaler Inhale 2 puffs into the lungs every 4 (four) hours as needed. 04/17/21   [provider]  ?lidocaine-prilocaine (EMLA) cream Apply topically once. 07/25/20   [provider]  ?NEEDLE, DISP, 18 G (B-D BLUNT FILL NEEDLE) 18G X 1-1/2" MISC Use 18G needle to draw testosterone for injection 10/15/20   Darren Co, MD  ?nicotine (NICODERM CQ - DOSED IN MG/24 HOURS) 21 mg/24hr patch Place 1 patch onto the skin daily. 06/01/14   [provider]  ?PROAIR HFA 108 (90 Base) MCG/ACT inhaler Inhale 2 puffs into the lungs every 6 (six) hours as needed. 12/28/20   [provider]  ?SYRINGE-NEEDLE, DISP, 3 ML  (BD ECLIPSE SYRINGE) 21G X 1" 3 ML MISC Use to administer testosterone 10/15/20   Darren Co, MD  ?tadalafil (CIALIS) 5 MG tablet Take 0.5-1 tablets (2.5-5 mg total) by mouth daily as needed for erectile dysfunction. 10/15/20   Darren Co, MD  ?testosterone cypionate (DEPOTESTOSTERONE CYPIONATE) 200 MG/ML injection Inject 1 mL (200 mg total) into the muscle every 14 (fourteen) days. ?Patient not taking: Reported on 05/28/2021 10/15/20   Darren Co, MD  ?tiZANidine (ZANAFLEX) 4 MG tablet Take 1 tablet (4 mg total) by mouth every 8 (eight) hours as needed for muscle spasms. ?Patient not taking: Reported on 05/28/2021 10/01/20   Coral Spikes, DO  ?TRULICITY 1.5 WL/8.9HT SOPN Inject 1.5 mg into the skin once a week. 05/13/21   [provider]  ? ? ?Physical Exam: ?Vitals:  ? 05/28/21 1841 05/28/21 1935 05/28/21 2000 05/28/21 2148  ?BP: 128/75 (!) 137/91 130/81 (!) 137/116  ?Pulse: 88 88 85 95  ?  Resp: '14 17 18 18  '$ ?Temp:  98.7 ?F (37.1 ?C) 98.6 ?F (37 ?C) 97.9 ?F (36.6 ?C)  ?TempSrc:  Oral Oral   ?SpO2: 96% 98% 97% 98%  ?Weight:      ?Height:      ? ?Physical Exam ?Vitals and nursing note reviewed.  ?Constitutional:   ?   General: He is not in acute distress. ?   Appearance: Normal appearance.  ?HENT:  ?   Head: Normocephalic and atraumatic.  ?Cardiovascular:  ?   Rate and Rhythm: Normal rate and regular rhythm.  ?   Pulses: Normal pulses.  ?   Heart sounds: Normal heart sounds. No murmur heard. ?Pulmonary:  ?   Effort: Pulmonary effort is normal.  ?   Breath sounds: Normal breath sounds. No wheezing or rhonchi.  ?Abdominal:  ?   General: Bowel sounds are normal.  ?   Palpations: Abdomen is soft.  ?   Tenderness: There is no abdominal tenderness.  ?Musculoskeletal:     ?   General: No swelling or tenderness.  ?   Cervical back: Normal range of motion and neck supple.  ?   Comments: See picture below of left great toe ?  ?Skin: ?   General: Skin is warm and dry.  ?Neurological:  ?   General: No focal  deficit present.  ?   Mental Status: He is alert. Mental status is at baseline.  ?Psychiatric:     ?   Mood and Affect: Mood normal.     ?   Behavior: Behavior normal.  ? ? ? ?Data Reviewed: ?Marland Kitchen ?Pertinent findin

## 2021-05-28 NOTE — ED Notes (Signed)
See triage note. Pt to ED for L great toe pain after recent injury to toe. Pt was told may have osteomyelitis and that may need MRI and IV abx. Wife at bedside. ?

## 2021-05-28 NOTE — Assessment & Plan Note (Addendum)
Stable.  Continue home medications. 

## 2021-05-28 NOTE — ED Notes (Signed)
Pt requesting to eat. Sent msg to provider to confirm whether can give food at this time. Has not had MRI yet to rule out osteomyelitis. ?

## 2021-05-28 NOTE — Telephone Encounter (Signed)
Patient called this morning regarding accutane medications and needing a antibiotic from ER. Patient dropped a stepping stone on his bare toe over the weekend and it's now swollen and infected. Patient was seen by ER yesterday and they also found another infection on his body that they state is leading into his bones. Patient could not start antibiotics due to accutane. They did give him an injection to help but advised him if no better this AM he needed to return to start IV antibiotics. Patient is going back to the ER today to get this started but wanted to know about d/c Claravis at this time and how soon can he start an antibiotic?  ?

## 2021-05-28 NOTE — Assessment & Plan Note (Addendum)
Management as above °

## 2021-05-28 NOTE — Assessment & Plan Note (Addendum)
HbA1c 7.8.  He may continue with his home medication regimen.   ?

## 2021-05-28 NOTE — Assessment & Plan Note (Signed)
Wound care.

## 2021-05-28 NOTE — Assessment & Plan Note (Addendum)
Patient had an injury to his left foot recently.  Developed over the erythema.  Concern for cellulitis.  There was also concern for acute gout.  Uric acid level was 6.8.  Gout is a possibility but this primarily appears to be an injury induced infection.   ?ABIs were normal.  Patient was seen by podiatry.  They do not plan any surgical intervention.  Erythema improved.  Patient be discharged home with doxycycline and Keflex. ?

## 2021-05-28 NOTE — Telephone Encounter (Signed)
Patient advised of information per Dr. Kowalski. aw 

## 2021-05-28 NOTE — ED Notes (Signed)
Pt resting in bed. Declines extra blankets. Waiting for MRI. Has spoken with screener. ?

## 2021-05-28 NOTE — ED Notes (Signed)
Pt in MRI now. Placed [place] sandwich tray at bedside with wife for pt when returns. ?

## 2021-05-28 NOTE — ED Provider Notes (Signed)
? ? ?[facility] ?Emergency Department Provider Note ? ? ? ? Event Date/Time  ? First MD Initiated Contact with Patient 05/28/21 1245   ?  (approximate) ? ? ?History  ? ?Wound Infection ? ? ?HPI ? ?Darren Allen is a 56 y.o. male with a history of type 2 diabetes, diabetic neuropathy, hypertension, COPD, and sleep apnea presents to the ED for evaluation management of a left foot wound that occurred on 20 May 2010.  Patient apparently dropped a large stepping stone onto his bare foot and was initially evaluated at an urgent care for the wound.  He was diagnosed with osteomyelitis there, is presenting to the ED at the request, for IV antibiotics. ?  ? ? ?Physical Exam  ? ?Triage Vital Signs: ?ED Triage Vitals  ?Enc Vitals Group  ?   BP 05/28/21 1143 136/87  ?   Pulse Rate 05/28/21 1143 (!) 106  ?   Resp 05/28/21 1143 16  ?   Temp 05/28/21 1143 98.6 ?F (37 ?C)  ?   Temp Source 05/28/21 1143 Oral  ?   SpO2 05/28/21 1143 97 %  ?   Weight 05/28/21 1141 250 lb (113.4 kg)  ?   Height 05/28/21 1141 '6\' 2"'$  (1.88 m)  ?   Head Circumference --   ?   Peak Flow --   ?   Pain Score 05/28/21 1140 5  ?   Pain Loc --   ?   Pain Edu? --   ?   Excl. in [place]? --   ? ? ?Most recent vital signs: ?Vitals:  ? 05/28/21 1841 05/28/21 1935  ?BP: 128/75 (!) 137/91  ?Pulse: 88 88  ?Resp: 14 17  ?Temp:  98.7 ?F (37.1 ?C)  ?SpO2: 96% 98%  ? ? ?General Awake, no distress.  ? ?CV:  Good peripheral perfusion. RRR ?RESP:  Normal effort. CTA ?ABD:  No distention. Soft, nontender ?SKIN:  Left foot with a superficial wound to 1st dorsal MTP with surrounding erythema and STS. No nail avulsion ? ? ?ED Results / Procedures / Treatments  ? ?Labs ?(all labs ordered are listed, but only abnormal results are displayed) ?Labs Reviewed  ?COMPREHENSIVE METABOLIC PANEL - Abnormal; Notable for the following components:  ?    Result Value  ? Glucose, Bld 160 (*)   ? All other components within normal limits  ?RESP PANEL BY RT-PCR (FLU A&B,  COVID) [id number]  ?CULTURE, BLOOD (ROUTINE X 2)  ?CULTURE, BLOOD (ROUTINE X 2)  ?CBC WITH DIFFERENTIAL/PLATELET  ?LACTIC ACID, PLASMA  ?URIC ACID  ?CREATININE, SERUM  ? ? ? ?EKG ? ? ?RADIOLOGY ? ?I personally viewed and evaluated these images as part of my medical decision making, as well as reviewing the written report by the radiologist. ? ?ED Provider Interpretation: bony erosion noted ? ?MR FOOT LEFT W WO CONTRAST ? ?Result Date: 05/28/2021 ?CLINICAL DATA:  Left foot pain, redness, and swelling since dropping a brick on it few days ago. EXAM: MRI OF THE LEFT FOREFOOT WITHOUT AND WITH CONTRAST TECHNIQUE: Multiplanar, multisequence MR imaging of the left forefoot was performed both before and after administration of intravenous contrast. CONTRAST:  74m GADAVIST GADOBUTROL 1 MMOL/ML IV SOLN COMPARISON:  Left foot x-rays from yesterday. FINDINGS: Bones/Joint/Cartilage Small juxta-articular erosion involving the medial first proximal phalanx head with associated small first IP joint effusion and synovitis. Additional small first MTP joint effusion with enhancing synovitis. No fracture or dislocation. 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 Ligaments Collateral ligaments are intact. Muscles and Tendons Flexor and extensor tendons are intact. Severe atrophy of the intrinsic foot muscles. Soft tissue Enhancing dorsal forefoot soft tissue swelling and skin thickening. No fluid collection or hematoma. No soft tissue mass. IMPRESSION: 1. Cellulitis of the dorsal forefoot without abscess or osteomyelitis. 2. Small juxta-articular erosion involving the medial first proximal phalanx head with associated small first IP joint effusion and synovitis. Additional small first MTP joint effusion with synovitis. Findings are suspicious for gout. Electronically Signed   By: Titus Dubin M.D.   On: 05/28/2021 18:40   ? ? ?PROCEDURES: ? ?Critical Care performed:  No ? ?Procedures ? ? ?MEDICATIONS ORDERED IN ED: ?Medications  ?vancomycin (VANCOCIN) IVPB 1000 mg/200 mL premix (has no administration in time range)  ?colchicine tablet 1.2 mg (has no administration in time range)  ?colchicine tablet 0.6 mg (has no administration in time range)  ?colchicine tablet 0.6 mg (has no administration in time range)  ?HYDROcodone-acetaminophen (NORCO/VICODIN) 5-325 MG per tablet 1 tablet (1 tablet Oral Given 05/28/21 1441)  ?vancomycin (VANCOREADY) IVPB 2000 mg/400 mL (0 mg Intravenous Stopped 05/28/21 1746)  ?gadobutrol (GADAVIST) 1 MMOL/ML injection 10 mL (10 mLs Intravenous Contrast Given 05/28/21 1826)  ?ondansetron [facility]) injection 4 mg (4 mg Intravenous Given 05/28/21 1935)  ?morphine (PF) 4 MG/ML injection 4 mg (4 mg Intravenous Given 05/28/21 1936)  ? ? ? ?IMPRESSION / MDM / ASSESSMENT AND PLAN / ED COURSE  ?I reviewed the triage vital signs and the nursing notes. ?             ?               ? ?Differential diagnosis includes, but is not limited to, osteomyelitis, cellulitis, open fracture, laceration  ? ?The patient is on the cardiac monitor to evaluate for evidence of arrhythmia and/or significant heart rate changes. ? ?----------------------------------------- ?3:11 PM on 05/28/2021 ?----------------------------------------- ?L/M for Dr. Sherryle Lis (podiatry) to discuss case.  ? ?Patient's diagnosis is concerning for osteomyelitis of the left foot.  Patient presents to the ED for concern after outpatient plain films show bony lesions concerning for early osteo-.  Patient was seen in local urgent care yesterday, given a single dose of IM Rocephin.  Upon x-ray reports today, he was advised to report to the ED for IV antibiotics.  Patient presents with some pain to the left foot at the great toe and some local erythema.  He denies any interim fevers, chills, or sweats.  Patient is evaluated for his complaints in the ED, patient labs are reassuring as it shows no acute leukocytosis,  lactic acidosis, or elevated uric acid.  MRI performed of the left foot does not confirm any bony erosion consistent with osteomyelitis.  Patient does have dorsal first MTP cellulitis without abscess, and synovitis concerning for gout exacerbation.  I discussed the case with Dr. Alene Mires with podiatry, and he would like the patient to be admitted to possible for emergent intervention, particularly if the patient has any evidence of PAD.  I will discussed the case with hospitalist for final disposition.  Patient will be admitted to the hospital service Prudy Feeler, MD) for continued IV antibiotics and consultation by podiatry.  Podiatry also has request for an ABI to be performed soon as possible.  ? ?FINAL CLINICAL IMPRESSION(S) / ED DIAGNOSES  ? ?Final diagnoses:  ?Cellulitis of left foot  ?Acute gout of left foot, unspecified cause  ? ? ? ?Rx /  DC Orders  ? ?ED Discharge Orders   ? ? None  ? ?  ? ? ? ?Note:  This document was prepared using Dragon voice recognition software and may include unintentional dictation errors. ? ?  ?Pete Merten, Dannielle Karvonen, PA-C ?05/28/21 2026 ? ?  ?Delman Kitten, MD ?05/30/21 [phone] ? ?

## 2021-05-28 NOTE — Consult Note (Signed)
Reason for Consult:Ulcer, diabetes, cellulitis Referring Physician: Dr Derrill Kay MD  Darren Allen is an 56 y.o. male.  HPI: dropped a brick on left foot 3-4 days ago while barefoot, had small abrasion. Foot swollen red and tender since that time. We are consulted for diabetic foot infection with ulceration   Past Medical History:  Diagnosis Date   Calculus of kidney 02/04/2015   COPD (chronic obstructive pulmonary disease) (HCC)    Diabetes mellitus without complication (HCC)    type 2   Hypertension    Neuropathy     Past Surgical History:  Procedure Laterality Date   APPENDECTOMY     INCISION AND DRAINAGE PERIRECTAL ABSCESS N/A 02/04/2015   Procedure: IRRIGATION AND DEBRIDEMENT PERIRECTAL ABSCESS;  Surgeon: Ida Rogue, MD;  Location: [facility] ORS;  Service: General;  Laterality: N/A;   ORIF ANKLE FRACTURE Left 03/23/2017   Procedure: OPEN REDUCTION INTERNAL FIXATION (ORIF) ANKLE FRACTURE;  Surgeon: Signa Kell, MD;  Location: [facility] ORS;  Service: Orthopedics;  Laterality: Left;   RECTAL EXAM UNDER ANESTHESIA  02/04/2015   Procedure: RECTAL EXAM UNDER ANESTHESIA;  Surgeon: Ida Rogue, MD;  Location: [facility] ORS;  Service: General;;   SYNDESMOSIS REPAIR Left 03/23/2017   Procedure: SYNDESMOSIS REPAIR;  Surgeon: Signa Kell, MD;  Location: [facility] ORS;  Service: Orthopedics;  Laterality: Left;    Family History  Problem Relation Age of Onset   Cancer Mother 39       Lung   Cancer Father        Colon   Heart disease Father    Alcohol abuse Father    Cancer Brother 37       Esophageal   Diabetes Brother    Heart disease Brother     Social History:  reports that he has been smoking cigarettes. He has a 17.00 pack-year smoking history. He has never used smokeless tobacco. He reports that he does not currently use alcohol after a past usage of about 12.0 standard drinks per week. He reports that he does not use drugs.  Allergies:  Allergies  Allergen Reactions    Glipizide Other (See Comments) and Palpitations    Shaky, feel bad Other reaction(s): Dizziness   Duloxetine Anxiety and Nausea Only    Medications: I have reviewed the patient's current medications.  Results for orders placed or performed during the hospital encounter of 05/28/21 (from the past 48 hour(s))  Comprehensive metabolic panel     Status: Abnormal   Collection Time: 05/28/21 11:48 AM  Result Value Ref Range   Sodium 136 135 - 145 mmol/L   Potassium 3.9 3.5 - 5.1 mmol/L   Chloride 104 98 - 111 mmol/L   CO2 24 22 - 32 mmol/L   Glucose, Bld 160 (H) 70 - 99 mg/dL    Comment: Glucose reference range applies only to samples taken after fasting for at least 8 hours.   BUN 16 6 - 20 mg/dL   Creatinine, Ser 1.61 0.61 - 1.24 mg/dL   Calcium 9.7 8.9 - 09.6 mg/dL   Total Protein 7.7 6.5 - 8.1 g/dL   Albumin 4.1 3.5 - 5.0 g/dL   AST 38 15 - 41 U/L   ALT 33 0 - 44 U/L   Alkaline Phosphatase 62 38 - 126 U/L   Total Bilirubin 0.8 0.3 - 1.2 mg/dL   GFR, Estimated >04 >54 mL/min    Comment: (NOTE) Calculated using the CKD-EPI Creatinine Equation (2021)    Anion gap 8 5 - 15  Comment: Performed at [facility], [address] [place], [place] [address]  CBC with Differential     Status: None   Collection Time: 05/28/21 11:48 AM  Result Value Ref Range   WBC 8.1 4.0 - 10.5 K/uL   RBC 5.14 4.22 - 5.81 MIL/uL   Hemoglobin 13.8 13.0 - 17.0 g/dL   HCT 19.1 47.8 - 29.5 %   MCV 86.6 80.0 - 100.0 fL   MCH 26.8 26.0 - 34.0 pg   MCHC 31.0 30.0 - 36.0 g/dL   RDW 62.1 30.8 - 65.7 %   Platelets 221 150 - 400 K/uL   nRBC 0.0 0.0 - 0.2 %   Neutrophils Relative % 62 %   Neutro Abs 5.1 1.7 - 7.7 K/uL   Lymphocytes Relative 20 %   Lymphs Abs 1.6 0.7 - 4.0 K/uL   Monocytes Relative 12 %   Monocytes Absolute 0.9 0.1 - 1.0 K/uL   Eosinophils Relative 5 %   Eosinophils Absolute 0.4 0.0 - 0.5 K/uL   Basophils Relative 1 %   Basophils Absolute 0.1 0.0 - 0.1 K/uL   Immature  Granulocytes 0 %   Abs Immature Granulocytes 0.02 0.00 - 0.07 K/uL    Comment: Performed at [facility], [address] [place], [place] [address]  Lactic acid, plasma     Status: None   Collection Time: 05/28/21  1:25 PM  Result Value Ref Range   Lactic Acid, Venous 1.7 0.5 - 1.9 mmol/L    Comment: Performed at [facility], [address] [place], [place] [address]  Resp Panel by RT-PCR (Flu A&B, Covid) Nasopharyngeal Swab     Status: None   Collection Time: 05/28/21  1:25 PM   Specimen: Nasopharyngeal Swab; Nasopharyngeal(NP) swabs in vial transport medium  Result Value Ref Range   SARS Coronavirus 2 by RT PCR NEGATIVE NEGATIVE    Comment: (NOTE) SARS-CoV-2 target nucleic acids are NOT DETECTED.  The SARS-CoV-2 RNA is generally detectable in upper respiratory specimens during the acute phase of infection. The lowest concentration of SARS-CoV-2 viral copies this assay can detect is 138 copies/mL. A negative result does not preclude SARS-Cov-2 infection and should not be used as the sole basis for treatment or other patient management decisions. A negative result may occur with  improper specimen collection/handling, submission of specimen other than nasopharyngeal swab, presence of viral mutation(s) within the areas targeted by this assay, and inadequate number of viral copies(<138 copies/mL). A negative result must be combined with clinical observations, patient history, and epidemiological information. The expected result is Negative.  Fact Sheet for Patients:  BloggerCourse.com  Fact Sheet for Healthcare Providers:  SeriousBroker.it  This test is no t yet approved or cleared by the [place] FDA and  has been authorized for detection and/or diagnosis of SARS-CoV-2 by FDA under an Emergency Use Authorization (EUA). This EUA will remain  in effect (meaning this test can be used) for the duration of  the COVID-19 declaration under Section 564(b)(1) of the Act, 21 U.S.C.section 360bbb-3(b)(1), unless the authorization is terminated  or revoked sooner.       Influenza A by PCR NEGATIVE NEGATIVE   Influenza B by PCR NEGATIVE NEGATIVE    Comment: (NOTE) The Xpert Xpress SARS-CoV-2/FLU/RSV plus assay is intended as an aid in the diagnosis of influenza from Nasopharyngeal swab specimens and should not be used as a sole basis for treatment. Nasal washings and aspirates are unacceptable for Xpert Xpress SARS-CoV-2/FLU/RSV testing.  Fact Sheet for Patients: BloggerCourse.com  Fact Sheet for Healthcare Providers: SeriousBroker.it  This test is not yet approved or cleared by the [place] FDA and has been authorized for detection and/or diagnosis of SARS-CoV-2 by FDA under an Emergency Use Authorization (EUA). This EUA will remain in effect (meaning this test can be used) for the duration of the COVID-19 declaration under Section 564(b)(1) of the Act, 21 U.S.C. section 360bbb-3(b)(1), unless the authorization is terminated or revoked.  Performed at [facility], [address] [place], [place] [address]     DG Foot Complete Left  Result Date: 05/27/2021 CLINICAL DATA:  A 56 year old male presents for evaluation of suspected foot infection, history of diabetes and previous ankle surgery. Greatest area of pain first metatarsophalangeal joint. EXAM: LEFT FOOT - COMPLETE 3+ VIEW COMPARISON:  None aside from ankle imaging from 2019. FINDINGS: Signs of disuse osteopenia. Erosive changes about the tip of the distal phalanx of the second digit with overhanging, well corticated margins. Erosion along the distal aspect of the medial distal proximal phalanx of the great toe which is less well corticated. No signs of acute fracture. No signs of dislocation. Soft tissue swelling about the great toe. Irregularity of the proximal phalanx  of the fifth digit. Suggest prior fracture of the distal aspect of the proximal phalanx of the fifth digit. IMPRESSION: 1. Soft tissue swelling about the great toe. 2. Erosive changes about the distal phalanx of the second digit and also along the distal aspect of the proximal phalanx of the great toe. Findings about the great toe raising the question of early osteomyelitis in the setting of soft tissue swelling. Findings about the second digit are better corticated than would be expected and show overhanging margins. Perhaps related to prior infection given well corticated margins. Correlate with any overlying signs of infection currently. MRI may be helpful for further evaluation. 3. Fracture of the proximal phalanx of the fifth digit of uncertain chronicity. Potentially subacute. Electronically Signed   By: Donzetta Kohut M.D.   On: 05/27/2021 16:01    Review of Systems  Musculoskeletal:  Positive for joint pain.  Skin:        Ulcer left foot  All other systems reviewed and are negative. Blood pressure (!) 132/91, pulse 85, temperature 98.6 F (37 C), temperature source Oral, resp. rate 16, height 6\' 2"  (1.88 m), weight 113.4 kg, SpO2 96 %.  Vitals:   05/28/21 1600 05/28/21 1630  BP: (!) 143/87 (!) 132/91  Pulse: 85 85  Resp:  16  Temp:    SpO2: 94% 96%    General AA&O x3. Normal mood and affect.  Vascular Dorsalis pedis and posterior tibial pulses non palpable, cap fill time NL  Neurologic Epicritic sensation grossly absent. Densely neuropathic  Dermatologic (Wound) Abrasion partial thickness without drainage to medial dorsal 1st MPJ  Orthopedic: Motor intact BLE. POP to lateral hallux and with 1st MP ROM     Assessment/Plan:  Diabetic foot infection, cellulitis -Agree with admission -Imaging: Studies independently reviewed. Agree with MRI -Antibiotics: treat empirically broad spectrum for diabetic foot infection per IDSA guidelines for now -Presentation appears similar to gout  as well as possible septic 1st MP joint. Uric acid ordered. Last A1c 8.3% -WB Status: WBAT BLE -Wound Care: no current recommendations, can cover with non adherent -Surgical Plan: no current surgical plans. If osteo or septic arthritis on MRI would plan for OR -Pulses non palpable. ABI ordered. Will need vascular consult if abnormal  Edwin Cap 05/28/2021, 4:46 PM   Best available via secure chat for questions or concerns.

## 2021-05-28 NOTE — ED Notes (Signed)
Also sent 1 set blood cultures, tubes for basic labs (green and purple) and blue top at this time. ?

## 2021-05-28 NOTE — Assessment & Plan Note (Addendum)
Patient with weak dorsalis pedis pulses on both feet.  Also found to have posterior tibial left greater than right.  ABIs noted to be normal. ?

## 2021-05-28 NOTE — Consult Note (Signed)
Pharmacy Antibiotic Note ? ?Darren Allen is a 56 y.o. male admitted on 05/28/2021 with  osteomyelitis .  Pharmacy has been consulted for vancomycin dosing. ? ?Plan: ?Vancomycin 2gm IV followed by 1gm IV every 12 hours ?Goal AUC 400-550  ?Est AUC: 490.1 ?Est Cmax:30.5 ?Est Cmin: 13.8 ?Calculated with SCr 1.15 mg/dL ? ? ?Height: '6\' 2"'$  (188 cm) ?Weight: 113.4 kg (250 lb) ?IBW/kg (Calculated) : 82.2 ? ?Temp (24hrs), Avg:98.4 ?F (36.9 ?C), Min:98.2 ?F (36.8 ?C), Max:98.6 ?F (37 ?C) ? ?Recent Labs  ?Lab 05/28/21 ?1148 05/28/21 ?1325  ?WBC 8.1  --   ?CREATININE 1.15  --   ?LATICACIDVEN  --  1.7  ?  ?Estimated Creatinine Clearance: 93.8 mL/min (by C-G formula based on SCr of 1.15 mg/dL).   ? ?Allergies  ?Allergen Reactions  ? Glipizide Other (See Comments) and Palpitations  ?  Shaky, feel bad ?Other reaction(s): Dizziness  ? Duloxetine Anxiety and Nausea Only  ? ? ?Antimicrobials this admission: ?3/15 Vancomycin >>  ?3/14 Ceftriaxone >>  ? ?Dose adjustments this admission: ? ? ?Microbiology results: ?3/15 BCx: sent ? ?Thank you for allowing pharmacy to be a part of this patient?s care. ? ?Darrick Penna ?05/28/2021 2:41 PM ? ?

## 2021-05-28 NOTE — Progress Notes (Signed)
Anticoagulation monitoring(Lovenox): ? ?56 yo male ordered Lovenox 40 mg Q24h ?   ?Filed Weights  ? 05/28/21 1141  ?Weight: 113.4 kg (250 lb)  ? ?BMI 32  ? ?Lab Results  ?Component Value Date  ? CREATININE 1.15 05/28/2021  ? CREATININE 1.35 (H) 12/12/2020  ? CREATININE 1.51 (H) 12/07/2019  ? ?Estimated Creatinine Clearance: 93.8 mL/min (by C-G formula based on SCr of 1.15 mg/dL). ?Hemoglobin & Hematocrit  ?   ?Component Value Date/Time  ? HGB 13.8 05/28/2021 1148  ? HGB 16.1 03/13/2014 2116  ? HCT 44.5 05/28/2021 1148  ? HCT 48.9 03/13/2014 2116  ? ? ? ?Per Protocol for Patient with estCrcl > 30 ml/min and BMI > 30, will transition to Lovenox 57.5 mg Q24h.  ?  ? ? ?

## 2021-05-28 NOTE — Assessment & Plan Note (Addendum)
-   Continue home medications 

## 2021-05-28 NOTE — ED Triage Notes (Signed)
Patient has wound present to left foot after dropping a stepping stone on it March 12. Diagnosed with osteomyelitis at [facility] and sent to ER for antibiotics.  ?

## 2021-05-28 NOTE — ED Notes (Signed)
Provider will order blood cultures before vanc toi be given. 1 set already drawn with initial IV stick. ?

## 2021-05-29 ENCOUNTER — Inpatient Hospital Stay: Payer: HMO

## 2021-05-29 DIAGNOSIS — E1140 Type 2 diabetes mellitus with diabetic neuropathy, unspecified: Secondary | ICD-10-CM

## 2021-05-29 DIAGNOSIS — L03116 Cellulitis of left lower limb: Principal | ICD-10-CM

## 2021-05-29 DIAGNOSIS — J449 Chronic obstructive pulmonary disease, unspecified: Secondary | ICD-10-CM | POA: Diagnosis not present

## 2021-05-29 DIAGNOSIS — I739 Peripheral vascular disease, unspecified: Secondary | ICD-10-CM | POA: Diagnosis not present

## 2021-05-29 LAB — CREATININE, SERUM
Creatinine, Ser: 1.08 mg/dL (ref 0.61–1.24)
GFR, Estimated: >60 mL/min (ref >60)

## 2021-05-29 LAB — GLUCOSE, CAPILLARY
Glucose-Capillary: 176 mg/dL — ABNORMAL HIGH (ref 70–99)
Glucose-Capillary: 186 mg/dL — ABNORMAL HIGH (ref 70–99)
Glucose-Capillary: 183 mg/dL — ABNORMAL HIGH (ref 70–99)
Glucose-Capillary: 148 mg/dL — ABNORMAL HIGH (ref 70–99)

## 2021-05-29 LAB — HIV ANTIBODY (ROUTINE TESTING W REFLEX): HIV Screen 4th Generation wRfx: NONREACTIVE

## 2021-05-29 MED ORDER — FLUTICASONE FUROATE-VILANTEROL 200-25 MCG/ACT IN AEPB
1.0000 | INHALATION_SPRAY | Freq: Every day | RESPIRATORY_TRACT | Status: DC
Start: 2021-05-29 — End: 2021-05-30
  Administered 2021-05-29 – 2021-05-30 (×2): 1 via RESPIRATORY_TRACT
  Filled 2021-05-29: qty 28

## 2021-05-29 MED ORDER — UMECLIDINIUM BROMIDE 62.5 MCG/ACT IN AEPB
1.0000 | INHALATION_SPRAY | Freq: Every day | RESPIRATORY_TRACT | Status: DC
  Administered 2021-05-29 – 2021-05-30 (×2): 1 via RESPIRATORY_TRACT
  Filled 2021-05-29: qty 7

## 2021-05-29 NOTE — Progress Notes (Addendum)
Attempted to see the patient today.  He is down in the ultrasound lab.  However ABIs PVRs were within normal limits.    No acute surgical plans at this time.  MRI was reviewed by me which does not show concern for osteomyelitis or septic arthritis.  Uric acid level is within normal limits. ? ?Patient is okay to be discharged from our standpoint once the redness has improved. ?

## 2021-05-29 NOTE — Progress Notes (Signed)
[facility] PROGRESS NOTE   Darren Allen ZOX:[id number] DOB: 1965/12/28 DOA: 05/28/2021  1 DOS: the patient was seen and examined on 05/29/2021  PCP: Dione Housekeeper, MD  Brief History and Hospital Course:  56 y.o. male with medical history significant of Diabetes, hypertension, COPD, GERD, diabetic neuropathy, sent from podiatry for inpatient antibiotic treatment of an infected wound of the left great toe leading to cellulitis.  Patient sustained a wound on the dorsal aspect of the left great toe when a brick fell on it and the toe has been painful, red and swelling since.  He has had no fevers or chills.  In the podiatry outpatient office, pulses were nonpalpable.  Patient was started on IV antibiotics and was hospitalized for further management.  Consultants: Podiatry  Procedures: None yet    Subjective: Pain is 5 out of 10 in intensity in the left foot.  Denies any chest pain shortness of breath.  No nausea vomiting.    Assessment/Plan:   * Cellulitis of left foot Patient had an injury to his left foot recently.  Developed over the erythema.  Concern for cellulitis.  There was also concern for acute gout.  Uric acid level was 6.8.  Gout is a possibility but this primarily appears to be an injury induced infection.   Continue IV antibiotics for now.  ABIs are pending.  Podiatry to follow.    Wound infection Management as above.  Wound care  PVD (peripheral vascular disease) (HCC) Patient with weak dorsalis pedis pulses on both feet.  Also found to have posterior tibial left greater than right.  ABIs are pending.    Type 2 diabetes mellitus with diabetic neuropathy, without long-term current use of insulin (HCC) No recent HbA1c.  Has been ordered and is pending.  Patient noted to be on 70/30 insulin along with metformin and SSI.  May need to adjust the dose of 70/30 insulin depending on diet status.   Hypertension Noted to be on HCTZ and lisinopril.   Monitor blood pressures.    Diabetic peripheral neuropathy (HCC) Continue amitriptyline, citalopram and gabapentin.  Chronic obstructive pulmonary disease (HCC) Stable.  Continue home medications.     Obesity Estimated body mass index is 32.1 kg/m as calculated from the following:   Height as of this encounter: 6\' 2"  (1.88 m).   Weight as of this encounter: 113.4 kg.   DVT Prophylaxis: Lovenox Code Status: Full code Family Communication: Discussed with patient Disposition Plan: To be determined.  Hopefully return home in improved  Status is: Inpatient Remains inpatient appropriate because: Left foot cellulitis requiring IV antibiotics     Medications: Scheduled:  amitriptyline  30 mg Oral QHS   citalopram  20 mg Oral Daily   colchicine  0.6 mg Oral Daily   enoxaparin (LOVENOX) injection  0.5 mg/kg Subcutaneous Q24H   ferrous sulfate  325 mg Oral Q breakfast   gabapentin  600 mg Oral TID   hydrochlorothiazide  25 mg Oral Daily   insulin aspart  0-15 Units Subcutaneous TID WC   insulin aspart  0-5 Units Subcutaneous QHS   insulin aspart protamine- aspart  20 Units Subcutaneous BID WC   lisinopril  40 mg Oral Daily   metFORMIN  1,000 mg Oral Q breakfast   pantoprazole  80 mg Oral Daily   rosuvastatin  40 mg Oral Daily   Continuous:  sodium chloride 10 mL/hr (05/28/21 2345)   cefTRIAXone (ROCEPHIN)  IV 1 g (05/28/21 2347)  vancomycin 1,000 mg (05/29/21 0315)   ZOX:WRUEAV chloride, acetaminophen **OR** acetaminophen, albuterol, colchicine, HYDROcodone-acetaminophen, morphine injection, ondansetron **OR** ondansetron (ZOFRAN) IV  Antibiotics: Anti-infectives (From admission, onward)    Start     Dose/Rate Route Frequency Ordered Stop   05/29/21 0300  vancomycin (VANCOCIN) IVPB 1000 mg/200 mL premix        1,000 mg 200 mL/hr over 60 Minutes Intravenous Every 12 hours 05/28/21 1448     05/28/21 2300  cefTRIAXone (ROCEPHIN) 1 g in sodium chloride 0.9 % 100 mL IVPB         1 g 200 mL/hr over 30 Minutes Intravenous Every 24 hours 05/28/21 2210 06/04/21 2259   05/28/21 1500  vancomycin (VANCOREADY) IVPB 2000 mg/400 mL        2,000 mg 200 mL/hr over 120 Minutes Intravenous  Once 05/28/21 1448 05/28/21 1746       Objective:  Vital Signs  Vitals:   05/28/21 2148 05/29/21 0010 05/29/21 0409 05/29/21 0906  BP: (!) 137/116 (!) 144/81 105/76 131/62  Pulse: 95 91 83 93  Resp: 18 16 18 18   Temp: 97.9 F (36.6 C) (!) 97.5 F (36.4 C) 97.8 F (36.6 C) 97.9 F (36.6 C)  TempSrc:      SpO2: 98% 99% 98% 100%  Weight:      Height:        Intake/Output Summary (Last 24 hours) at 05/29/2021 1000 Last data filed at 05/28/2021 1746 Gross per 24 hour  Intake 400 ml  Output --  Net 400 ml   Filed Weights   05/28/21 1141  Weight: 113.4 kg    General appearance: Awake alert.  In no distress Resp: Clear to auscultation bilaterally.  Normal effort Cardio: S1-S2 is normal regular.  No S3-S4.  No rubs murmurs or bruit GI: Abdomen is soft.  Nontender nondistended.  Bowel sounds are present normal.  No masses organomegaly Extremities: Erythema noted over the left distal foot predominantly over the left first toe.  Swelling is noted.  Warm to touch.  Weak but palpable DP pulses.  Posterior tibial pulses also palpable. Neurologic: Alert and oriented x3.  No focal neurological deficits.    Lab Results:  Data Reviewed: I have personally reviewed labs and imaging study reports  CBC: Recent Labs  Lab 05/28/21 1148  WBC 8.1  NEUTROABS 5.1  HGB 13.8  HCT 44.5  MCV 86.6  PLT 221    Basic Metabolic Panel: Recent Labs  Lab 05/28/21 1148 05/29/21 0608  NA 136  --   K 3.9  --   CL 104  --   CO2 24  --   GLUCOSE 160*  --   BUN 16  --   CREATININE 1.15 1.08  CALCIUM 9.7  --     GFR: Estimated Creatinine Clearance: 99.9 mL/min (by C-G formula based on SCr of 1.08 mg/dL).  Liver Function Tests: Recent Labs  Lab 05/28/21 1148  AST 38  ALT 33   ALKPHOS 62  BILITOT 0.8  PROT 7.7  ALBUMIN 4.1     CBG: Recent Labs  Lab 05/28/21 2145 05/29/21 0806  GLUCAP 199* 176*     Recent Results (from the past 240 hour(s))  Resp Panel by RT-PCR (Flu A&B, Covid) Nasopharyngeal Swab     Status: None   Collection Time: 05/28/21  1:25 PM   Specimen: Nasopharyngeal Swab; Nasopharyngeal(NP) swabs in vial transport medium  Result Value Ref Range Status   SARS Coronavirus 2 by RT PCR NEGATIVE NEGATIVE Final  Comment: (NOTE) SARS-CoV-2 target nucleic acids are NOT DETECTED.  The SARS-CoV-2 RNA is generally detectable in upper respiratory specimens during the acute phase of infection. The lowest concentration of SARS-CoV-2 viral copies this assay can detect is 138 copies/mL. A negative result does not preclude SARS-Cov-2 infection and should not be used as the sole basis for treatment or other patient management decisions. A negative result may occur with  improper specimen collection/handling, submission of specimen other than nasopharyngeal swab, presence of viral mutation(s) within the areas targeted by this assay, and inadequate number of viral copies(<138 copies/mL). A negative result must be combined with clinical observations, patient history, and epidemiological information. The expected result is Negative.  Fact Sheet for Patients:  BloggerCourse.com  Fact Sheet for Healthcare Providers:  SeriousBroker.it  This test is no t yet approved or cleared by the [place] FDA and  has been authorized for detection and/or diagnosis of SARS-CoV-2 by FDA under an Emergency Use Authorization (EUA). This EUA will remain  in effect (meaning this test can be used) for the duration of the COVID-19 declaration under Section 564(b)(1) of the Act, 21 U.S.C.section 360bbb-3(b)(1), unless the authorization is terminated  or revoked sooner.       Influenza A by PCR NEGATIVE NEGATIVE  Final   Influenza B by PCR NEGATIVE NEGATIVE Final    Comment: (NOTE) The Xpert Xpress SARS-CoV-2/FLU/RSV plus assay is intended as an aid in the diagnosis of influenza from Nasopharyngeal swab specimens and should not be used as a sole basis for treatment. Nasal washings and aspirates are unacceptable for Xpert Xpress SARS-CoV-2/FLU/RSV testing.  Fact Sheet for Patients: BloggerCourse.com  Fact Sheet for Healthcare Providers: SeriousBroker.it  This test is not yet approved or cleared by the [place] FDA and has been authorized for detection and/or diagnosis of SARS-CoV-2 by FDA under an Emergency Use Authorization (EUA). This EUA will remain in effect (meaning this test can be used) for the duration of the COVID-19 declaration under Section 564(b)(1) of the Act, 21 U.S.C. section 360bbb-3(b)(1), unless the authorization is terminated or revoked.  Performed at [facility], [address] [place], [place] [address]   Culture, blood (routine x 2)     Status: None (Preliminary result)   Collection Time: 05/28/21  1:25 PM   Specimen: BLOOD  Result Value Ref Range Status   Specimen Description BLOOD RIGHT FOREARM  Final   Special Requests   Final    BOTTLES DRAWN AEROBIC AND ANAEROBIC Blood Culture results may not be optimal due to an excessive volume of blood received in culture bottles   Culture   Final    NO GROWTH < 12 HOURS Performed at [facility], [address], [place], [place] [address]    Report Status PENDING  Incomplete  Culture, blood (routine x 2)     Status: None (Preliminary result)   Collection Time: 05/28/21  2:39 PM   Specimen: BLOOD  Result Value Ref Range Status   Specimen Description BLOOD RAC  Final   Special Requests BOTTLES DRAWN AEROBIC AND ANAEROBIC BCAV  Final   Culture   Final    NO GROWTH < 24 HOURS Performed at [facility], [address] [place],  [place] [address]    Report Status PENDING  Incomplete      Radiology Studies: MR FOOT LEFT W WO CONTRAST  Result Date: 05/28/2021 CLINICAL DATA:  Left foot pain, redness, and swelling since dropping a brick on it few days ago.  EXAM: MRI OF THE LEFT FOREFOOT WITHOUT AND WITH CONTRAST TECHNIQUE: Multiplanar, multisequence MR imaging of the left forefoot was performed both before and after administration of intravenous contrast. CONTRAST:  10mL GADAVIST GADOBUTROL 1 MMOL/ML IV SOLN COMPARISON:  Left foot x-rays from yesterday. FINDINGS: Bones/Joint/Cartilage Small juxta-articular erosion involving the medial first proximal phalanx head with associated small first IP joint effusion and synovitis. Additional small first MTP joint effusion with enhancing synovitis. No fracture or dislocation. 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 Ligaments Collateral ligaments are intact. Muscles and Tendons Flexor and extensor tendons are intact. Severe atrophy of the intrinsic foot muscles. Soft tissue Enhancing dorsal forefoot soft tissue swelling and skin thickening. No fluid collection or hematoma. No soft tissue mass. IMPRESSION: 1. Cellulitis of the dorsal forefoot without abscess or osteomyelitis. 2. Small juxta-articular erosion involving the medial first proximal phalanx head with associated small first IP joint effusion and synovitis. Additional small first MTP joint effusion with synovitis. Findings are suspicious for gout. Electronically Signed   By: Obie Dredge M.D.   On: 05/28/2021 18:40   DG Foot Complete Left  Result Date: 05/27/2021 CLINICAL DATA:  A 56 year old male presents for evaluation of suspected foot infection, history of diabetes and previous ankle surgery. Greatest area of pain first metatarsophalangeal joint. EXAM: LEFT FOOT - COMPLETE 3+ VIEW COMPARISON:  None aside from ankle imaging from 2019. FINDINGS: Signs of disuse osteopenia. Erosive  changes about the tip of the distal phalanx of the second digit with overhanging, well corticated margins. Erosion along the distal aspect of the medial distal proximal phalanx of the great toe which is less well corticated. No signs of acute fracture. No signs of dislocation. Soft tissue swelling about the great toe. Irregularity of the proximal phalanx of the fifth digit. Suggest prior fracture of the distal aspect of the proximal phalanx of the fifth digit. IMPRESSION: 1. Soft tissue swelling about the great toe. 2. Erosive changes about the distal phalanx of the second digit and also along the distal aspect of the proximal phalanx of the great toe. Findings about the great toe raising the question of early osteomyelitis in the setting of soft tissue swelling. Findings about the second digit are better corticated than would be expected and show overhanging margins. Perhaps related to prior infection given well corticated margins. Correlate with any overlying signs of infection currently. MRI may be helpful for further evaluation. 3. Fracture of the proximal phalanx of the fifth digit of uncertain chronicity. Potentially subacute. Electronically Signed   By: Donzetta Kohut M.D.   On: 05/27/2021 16:01       LOS: 1 day   [employer]  [facility] Pager on [email or web address]  05/29/2021, 10:00 AM

## 2021-05-29 NOTE — Hospital Course (Signed)
56 y.o. male with medical history significant of Diabetes, hypertension, COPD, GERD, diabetic neuropathy, sent from podiatry for inpatient antibiotic treatment of an infected wound of the left great toe leading to cellulitis.  Patient sustained a wound on the dorsal aspect of the left great toe when a brick fell on it and the toe has been painful, red and swelling since.  He has had no fevers or chills.  In the podiatry outpatient office, pulses were nonpalpable.  Patient was started on IV antibiotics and was hospitalized for further management. ?

## 2021-05-29 NOTE — Plan of Care (Signed)

## 2021-05-30 LAB — CBC
HCT: 40.9 % (ref 39.0–52.0)
Hemoglobin: 13 g/dL (ref 13.0–17.0)
MCH: 27.2 pg (ref 26.0–34.0)
MCHC: 31.8 g/dL (ref 30.0–36.0)
MCV: 85.6 fL (ref 80.0–100.0)
Platelets: 227 10*3/uL (ref 150–400)
RBC: 4.78 MIL/uL (ref 4.22–5.81)
RDW: 14.7 % (ref 11.5–15.5)
WBC: 6.7 10*3/uL (ref 4.0–10.5)
nRBC: 0 % (ref 0.0–0.2)

## 2021-05-30 LAB — BASIC METABOLIC PANEL
Anion gap: 10 (ref 5–15)
BUN: 19 mg/dL (ref 6–20)
CO2: 27 mmol/L (ref 22–32)
Calcium: 9 mg/dL (ref 8.9–10.3)
Chloride: 101 mmol/L (ref 98–111)
Creatinine, Ser: 1.19 mg/dL (ref 0.61–1.24)
GFR, Estimated: >60 mL/min (ref >60)
Glucose, Bld: 130 mg/dL — ABNORMAL HIGH (ref 70–99)
Potassium: 3.7 mmol/L (ref 3.5–5.1)
Sodium: 138 mmol/L (ref 135–145)

## 2021-05-30 LAB — HEMOGLOBIN A1C
Hgb A1c MFr Bld: 7.8 % — ABNORMAL HIGH (ref 4.8–5.6)
Mean Plasma Glucose: 177 mg/dL

## 2021-05-30 LAB — GLUCOSE, CAPILLARY
Glucose-Capillary: 155 mg/dL — ABNORMAL HIGH (ref 70–99)
Glucose-Capillary: 197 mg/dL — ABNORMAL HIGH (ref 70–99)

## 2021-05-30 MED ORDER — DOXYCYCLINE HYCLATE 100 MG PO TABS
100.0000 mg | ORAL_TABLET | Freq: Two times a day (BID) | ORAL | Status: DC
  Administered 2021-05-30: 100 mg via ORAL
  Filled 2021-05-30: qty 1

## 2021-05-30 MED ORDER — CEPHALEXIN 500 MG PO CAPS
500.0000 mg | ORAL_CAPSULE | Freq: Four times a day (QID) | ORAL | Status: DC
Start: 2021-05-30 — End: 2021-05-30

## 2021-05-30 MED ORDER — DOXYCYCLINE HYCLATE 100 MG PO TABS
100.0000 mg | ORAL_TABLET | Freq: Two times a day (BID) | ORAL | 0 refills | Status: AC
Start: 2021-05-30 — End: 2021-06-06

## 2021-05-30 MED ORDER — CEPHALEXIN 500 MG PO CAPS: 500.0000 mg | ORAL_CAPSULE | Freq: Four times a day (QID) | ORAL | 0 refills | Status: AC

## 2021-05-30 NOTE — Plan of Care (Signed)
Patient discharged home per MD orders at this time.All discharge instructions,education and medications reviewed with the patient.Pt expressed understanding and will comply with dc instructions.follow up appointments was also communicated to the patient.no verbal c/o or any ssx of distress.patient was discharged home with self care per order. ?Patient was transported home by girl friend in a privately owned vehicle. ?

## 2021-05-30 NOTE — Discharge Summary (Signed)
[facility]  Physician Discharge Summary   Patient ID: KASI DAIN MRN: [id number] DOB/AGE: 1966/03/11 56 y.o.  Admit date: 05/28/2021 Discharge date: 05/30/2021    PCP: Dione Housekeeper, MD  DISCHARGE DIAGNOSES:  Principal Problem:   Cellulitis of left foot Active Problems:   Wound infection   PVD (peripheral vascular disease) (HCC)   Type 2 diabetes mellitus with diabetic neuropathy, without long-term current use of insulin (HCC)   Hypertension   Diabetic peripheral neuropathy (HCC)   Chronic obstructive pulmonary disease (HCC)   RECOMMENDATIONS FOR OUTPATIENT FOLLOW UP: Follow-up with PCP or podiatrist in 1 to 2 weeks   Home Health: None Equipment/Devices: None  CODE STATUS: Full code  DISCHARGE CONDITION: fair  Diet recommendation: Modified carbohydrate  INITIAL HISTORY: 56 y.o. male with medical history significant of Diabetes, hypertension, COPD, GERD, diabetic neuropathy, sent from podiatry for inpatient antibiotic treatment of an infected wound of the left great toe leading to cellulitis.  Patient sustained a wound on the dorsal aspect of the left great toe when a brick fell on it and the toe has been painful, red and swelling since.  He has had no fevers or chills.  In the podiatry outpatient office, pulses were nonpalpable.  Patient was started on IV antibiotics and was hospitalized for further management.  Consultations: Podiatry  Procedures: None   HOSPITAL COURSE:     * Cellulitis of left foot Patient had an injury to his left foot recently.  Developed over the erythema.  Concern for cellulitis.  There was also concern for acute gout.  Uric acid level was 6.8.  Gout is a possibility but this primarily appears to be an injury induced infection.   ABIs were normal.  Patient was seen by podiatry.  They do not plan any surgical intervention.  Erythema improved.  Patient be discharged home with doxycycline and Keflex.  Wound  infection Management as above.   PVD (peripheral vascular disease) (HCC) Patient with weak dorsalis pedis pulses on both feet.  Also found to have posterior tibial left greater than right.  ABIs noted to be normal.  Type 2 diabetes mellitus with diabetic neuropathy, without long-term current use of insulin (HCC) HbA1c 7.8.  He may continue with his home medication regimen.    Hypertension Continue home medications.  Diabetic peripheral neuropathy (HCC) Continue amitriptyline, citalopram and gabapentin.  Chronic obstructive pulmonary disease (HCC) Stable.  Continue home medications.     Obesity Estimated body mass index is 32.1 kg/m as calculated from the following:   Height as of this encounter: 6\' 2"  (1.88 m).   Weight as of this encounter: 113.4 kg.  Patient is stable.  Okay for discharge today.  Erythema over his foot has significantly improved.  PERTINENT LABS:  The results of significant diagnostics from this hospitalization (including imaging, microbiology, ancillary and laboratory) are listed below for reference.    Microbiology: Recent Results (from the past 240 hour(s))  Resp Panel by RT-PCR (Flu A&B, Covid) Nasopharyngeal Swab     Status: None   Collection Time: 05/28/21  1:25 PM   Specimen: Nasopharyngeal Swab; Nasopharyngeal(NP) swabs in vial transport medium  Result Value Ref Range Status   SARS Coronavirus 2 by RT PCR NEGATIVE NEGATIVE Final    Comment: (NOTE) SARS-CoV-2 target nucleic acids are NOT DETECTED.  The SARS-CoV-2 RNA is generally detectable in upper respiratory specimens during the acute phase of infection. The lowest concentration of SARS-CoV-2 viral copies this assay can detect is 138 copies/mL. A  negative result does not preclude SARS-Cov-2 infection and should not be used as the sole basis for treatment or other patient management decisions. A negative result may occur with  improper specimen collection/handling, submission of specimen  other than nasopharyngeal swab, presence of viral mutation(s) within the areas targeted by this assay, and inadequate number of viral copies(<138 copies/mL). A negative result must be combined with clinical observations, patient history, and epidemiological information. The expected result is Negative.  Fact Sheet for Patients:  BloggerCourse.com  Fact Sheet for Healthcare Providers:  SeriousBroker.it  This test is no t yet approved or cleared by the [place] FDA and  has been authorized for detection and/or diagnosis of SARS-CoV-2 by FDA under an Emergency Use Authorization (EUA). This EUA will remain  in effect (meaning this test can be used) for the duration of the COVID-19 declaration under Section 564(b)(1) of the Act, 21 U.S.C.section 360bbb-3(b)(1), unless the authorization is terminated  or revoked sooner.       Influenza A by PCR NEGATIVE NEGATIVE Final   Influenza B by PCR NEGATIVE NEGATIVE Final    Comment: (NOTE) The Xpert Xpress SARS-CoV-2/FLU/RSV plus assay is intended as an aid in the diagnosis of influenza from Nasopharyngeal swab specimens and should not be used as a sole basis for treatment. Nasal washings and aspirates are unacceptable for Xpert Xpress SARS-CoV-2/FLU/RSV testing.  Fact Sheet for Patients: BloggerCourse.com  Fact Sheet for Healthcare Providers: SeriousBroker.it  This test is not yet approved or cleared by the [place] FDA and has been authorized for detection and/or diagnosis of SARS-CoV-2 by FDA under an Emergency Use Authorization (EUA). This EUA will remain in effect (meaning this test can be used) for the duration of the COVID-19 declaration under Section 564(b)(1) of the Act, 21 U.S.C. section 360bbb-3(b)(1), unless the authorization is terminated or revoked.  Performed at [facility], [address]  [place], [place] [address]   Culture, blood (routine x 2)     Status: None (Preliminary result)   Collection Time: 05/28/21  1:25 PM   Specimen: BLOOD  Result Value Ref Range Status   Specimen Description BLOOD RIGHT FOREARM  Final   Special Requests   Final    BOTTLES DRAWN AEROBIC AND ANAEROBIC Blood Culture results may not be optimal due to an excessive volume of blood received in culture bottles   Culture   Final    NO GROWTH 3 DAYS Performed at [facility], [address], [place], [place] [address]    Report Status PENDING  Incomplete  Culture, blood (routine x 2)     Status: None (Preliminary result)   Collection Time: 05/28/21  2:39 PM   Specimen: BLOOD  Result Value Ref Range Status   Specimen Description BLOOD RAC  Final   Special Requests BOTTLES DRAWN AEROBIC AND ANAEROBIC BCAV  Final   Culture   Final    NO GROWTH 3 DAYS Performed at [facility], [address], [place], [place] [address]    Report Status PENDING  Incomplete     Labs:  COVID-19 Labs    Lab Results  Component Value Date   SARSCOV2NAA NEGATIVE 05/28/2021   SARSCOV2NAA NEGATIVE 03/13/2020   SARSCOV2NAA NEGATIVE 12/08/2019   SARSCOV2NAA NEGATIVE 03/07/2019      Basic Metabolic Panel: Recent Labs  Lab 05/28/21 1148 05/29/21 0608 05/30/21 0400  NA 136  --  138  K 3.9  --  3.7  CL 104  --  101  CO2  24  --  27  GLUCOSE 160*  --  130*  BUN 16  --  19  CREATININE 1.15 1.08 1.19  CALCIUM 9.7  --  9.0   Liver Function Tests: Recent Labs  Lab 05/28/21 1148  AST 38  ALT 33  ALKPHOS 62  BILITOT 0.8  PROT 7.7  ALBUMIN 4.1    CBC: Recent Labs  Lab 05/28/21 1148 05/30/21 0400  WBC 8.1 6.7  NEUTROABS 5.1  --   HGB 13.8 13.0  HCT 44.5 40.9  MCV 86.6 85.6  PLT 221 227     CBG: Recent Labs  Lab 05/29/21 1240 05/29/21 1630 05/29/21 2028 05/30/21 0508 05/30/21 0813  GLUCAP 186* 183* 148* 155* 197*     IMAGING STUDIES MR FOOT LEFT W WO  CONTRAST  Result Date: 05/28/2021 CLINICAL DATA:  Left foot pain, redness, and swelling since dropping a brick on it few days ago. EXAM: MRI OF THE LEFT FOREFOOT WITHOUT AND WITH CONTRAST TECHNIQUE: Multiplanar, multisequence MR imaging of the left forefoot was performed both before and after administration of intravenous contrast. CONTRAST:  10mL GADAVIST GADOBUTROL 1 MMOL/ML IV SOLN COMPARISON:  Left foot x-rays from yesterday. FINDINGS: Bones/Joint/Cartilage Small juxta-articular erosion involving the medial first proximal phalanx head with associated small first IP joint effusion and synovitis. Additional small first MTP joint effusion with enhancing synovitis. No fracture or dislocation. 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 Ligaments Collateral ligaments are intact. Muscles and Tendons Flexor and extensor tendons are intact. Severe atrophy of the intrinsic foot muscles. Soft tissue Enhancing dorsal forefoot soft tissue swelling and skin thickening. No fluid collection or hematoma. No soft tissue mass. IMPRESSION: 1. Cellulitis of the dorsal forefoot without abscess or osteomyelitis. 2. Small juxta-articular erosion involving the medial first proximal phalanx head with associated small first IP joint effusion and synovitis. Additional small first MTP joint effusion with synovitis. Findings are suspicious for gout. Electronically Signed   By: Obie Dredge M.D.   On: 05/28/2021 18:40   US ARTERIAL ABI (SCREENING LOWER EXTREMITY)  Result Date: 05/29/2021 CLINICAL DATA:  56 year old male with history of left great toe wound infection. EXAM: NONINVASIVE PHYSIOLOGIC VASCULAR STUDY OF BILATERAL LOWER EXTREMITIES TECHNIQUE: Evaluation of both lower extremities were performed at rest, including calculation of ankle-brachial indices with single level Doppler, pressure and pulse volume recording. COMPARISON:  None. FINDINGS: Right ABI:  1.01 Left ABI:  1.09  Right Lower Extremity:  Normal arterial waveforms at the ankle. Left Lower Extremity:  Normal arterial waveforms at the ankle. IMPRESSION: Normal ankle-brachial indices. Marliss Coots, MD [facility] Electronically Signed   By: Marliss Coots M.D.   On: 05/29/2021 11:54   DG Foot Complete Left  Result Date: 05/27/2021 CLINICAL DATA:  A 56 year old male presents for evaluation of suspected foot infection, history of diabetes and previous ankle surgery. Greatest area of pain first metatarsophalangeal joint. EXAM: LEFT FOOT - COMPLETE 3+ VIEW COMPARISON:  None aside from ankle imaging from 2019. FINDINGS: Signs of disuse osteopenia. Erosive changes about the tip of the distal phalanx of the second digit with overhanging, well corticated margins. Erosion along the distal aspect of the medial distal proximal phalanx of the great toe which is less well corticated. No signs of acute fracture. No signs of dislocation. Soft tissue swelling about the great toe. Irregularity of the proximal phalanx of the fifth digit. Suggest prior fracture of the distal aspect of the proximal  phalanx of the fifth digit. IMPRESSION: 1. Soft tissue swelling about the great toe. 2. Erosive changes about the distal phalanx of the second digit and also along the distal aspect of the proximal phalanx of the great toe. Findings about the great toe raising the question of early osteomyelitis in the setting of soft tissue swelling. Findings about the second digit are better corticated than would be expected and show overhanging margins. Perhaps related to prior infection given well corticated margins. Correlate with any overlying signs of infection currently. MRI may be helpful for further evaluation. 3. Fracture of the proximal phalanx of the fifth digit of uncertain chronicity. Potentially subacute. Electronically Signed   By: Donzetta Kohut M.D.   On: 05/27/2021 16:01    DISCHARGE  EXAMINATION: Vitals:   05/29/21 1242 05/29/21 1513 05/29/21 1955 05/30/21 0511  BP: 122/78 (!) 119/95 126/74 132/73  Pulse: 96 91 92 86  Resp: 18 16 20 20   Temp: 97.6 F (36.4 C) (!) 97.5 F (36.4 C) 97.7 F (36.5 C) 97.9 F (36.6 C)  TempSrc:      SpO2: 96% 98% 95% 98%  Weight:      Height:       General appearance: Awake alert.  In no distress Resp: Clear to auscultation bilaterally.  Normal effort Cardio: S1-S2 is normal regular.  No S3-S4.  No rubs murmurs or bruit GI: Abdomen is soft.  Nontender nondistended.  Bowel sounds are present normal.  No masses organomegaly Extremities: Improvement in erythema over the left foot   DISPOSITION: Home  Discharge Instructions     Call MD for:  difficulty breathing, headache or visual disturbances   Complete by: As directed    Call MD for:  extreme fatigue   Complete by: As directed    Call MD for:  persistant dizziness or light-headedness   Complete by: As directed    Call MD for:  persistant nausea and vomiting   Complete by: As directed    Call MD for:  redness, tenderness, or signs of infection (pain, swelling, redness, odor or green/yellow discharge around incision site)   Complete by: As directed    Call MD for:  severe uncontrolled pain   Complete by: As directed    Call MD for:  temperature >100.4   Complete by: As directed    Diet - low sodium heart healthy   Complete by: As directed    Discharge instructions   Complete by: As directed    Please take your medications as prescribed.  Please be sure to follow-up with your primary care provider or the podiatrist in 1 week.  Seek attention if the symptoms recur or if you develop high fevers or worsening pain in your foot. Please do not take the Absorica while you are on the antibiotics.  You were cared for by a hospitalist during your hospital stay. If you have any questions about your discharge medications or the care you received while you were in the hospital after you  are discharged, you can call the unit and asked to speak with the hospitalist on call if the hospitalist that took care of you is not available. Once you are discharged, your primary care physician will handle any further medical issues. Please note that NO REFILLS for any discharge medications will be authorized once you are discharged, as it is imperative that you return to your primary care physician (or establish a relationship with a primary care physician if you do not have one)  for your aftercare needs so that they can reassess your need for medications and monitor your lab values. If you do not have a primary care physician, you can call [phone] for a physician referral.   Increase activity slowly   Complete by: As directed    No wound care   Complete by: As directed           Allergies as of 05/30/2021       Reactions   Glipizide Other (See Comments), Palpitations   Shaky, feel bad Other reaction(s): Dizziness   Duloxetine Anxiety, Nausea Only        Medication List     STOP taking these medications    ISOtretinoin 30 MG capsule Commonly known as: Absorica   testosterone cypionate 200 MG/ML injection Commonly known as: DEPOTESTOSTERONE CYPIONATE   tiZANidine 4 MG tablet Commonly known as: Zanaflex       TAKE these medications    Accu-Chek Softclix Lancets lancets 3 (three) times daily.   acetaminophen 500 MG tablet Commonly known as: TYLENOL Take 2 tablets (1,000 mg total) by mouth every 8 (eight) hours.   amitriptyline 10 MG tablet Commonly known as: ELAVIL Take 30 mg by mouth at bedtime.   ascorbic acid 500 MG tablet Commonly known as: VITAMIN C Take by mouth daily.   B-D BLUNT FILL NEEDLE 18G X 1-1/2" Misc Generic drug: NEEDLE (DISP) 18 G Use 18G needle to draw testosterone for injection   BD Disp Needles 22G X 1-1/2" Misc Generic drug: NEEDLE (DISP) 22 G every 14 (fourteen) days.   BD Eclipse Syringe 21G X 1" 3 ML Misc Generic drug:  SYRINGE-NEEDLE (DISP) 3 ML Use to administer testosterone   cephALEXin 500 MG capsule Commonly known as: KEFLEX Take 1 capsule (500 mg total) by mouth every 6 (six) hours for 7 days.   citalopram 20 MG tablet Commonly known as: CELEXA Take 20 mg by mouth daily.   doxycycline 100 MG tablet Commonly known as: VIBRA-TABS Take 1 tablet (100 mg total) by mouth every 12 (twelve) hours for 7 days.   ferrous sulfate 325 (65 FE) MG tablet Take 325 mg by mouth daily with breakfast.   gabapentin 600 MG tablet Commonly known as: NEURONTIN Take 600 mg by mouth 3 (three) times daily.   HumuLIN 70/30 KwikPen (70-30) 100 UNIT/ML KwikPen Generic drug: insulin isophane & regular human KwikPen Inject 42-48 Units into the skin 2 (two) times daily with a meal. 48 units every morning and 42 units every evening   hydrochlorothiazide 25 MG tablet Commonly known as: HYDRODIURIL Take 25 mg by mouth daily.   HYDROcodone-acetaminophen 5-325 MG tablet Commonly known as: NORCO/VICODIN Take 1 tablet by mouth 2 (two) times daily as needed.   levalbuterol 45 MCG/ACT inhaler Commonly known as: XOPENEX HFA Inhale 2 puffs into the lungs every 4 (four) hours as needed.   lidocaine-prilocaine cream Commonly known as: EMLA Apply topically once.   lisinopril 40 MG tablet Commonly known as: ZESTRIL Take 40 mg by mouth daily.   metFORMIN 500 MG 24 hr tablet Commonly known as: GLUCOPHAGE-XR Take 2 tablets by mouth daily.   nicotine 21 mg/24hr patch Commonly known as: NICODERM CQ - dosed in mg/24 hours Place 1 patch onto the skin daily.   pantoprazole 40 MG tablet Commonly known as: PROTONIX Take 80 mg by mouth daily.   ProAir HFA 108 (90 Base) MCG/ACT inhaler Generic drug: albuterol Inhale 2 puffs into the lungs every 6 (six) hours as needed.  rosuvastatin 40 MG tablet Commonly known as: CRESTOR Take 40 mg by mouth daily.   tadalafil 5 MG tablet Commonly known as: CIALIS Take 0.5-1 tablets  (2.5-5 mg total) by mouth daily as needed for erectile dysfunction.   Trelegy Ellipta 200-62.5-25 MCG/ACT Aepb Generic drug: Fluticasone-Umeclidin-Vilant Inhale 1 puff into the lungs daily.   Trulicity 1.5 MG/0.5ML Sopn Generic drug: Dulaglutide Inject 1.5 mg into the skin once a week.          Follow-up Information     McDonald, Rachelle Hora, DPM. Schedule an appointment as soon as possible for a visit in 1 week(s).   Specialty: Podiatry Contact information: [address] [place] [address] [phone]                 TOTAL DISCHARGE TIME: 35 minutes  Embree Brawley Rito Ehrlich  [facility] Pager on [email or web address]  05/31/2021, 1:22 PM

## 2021-05-30 NOTE — Plan of Care (Signed)
?  Problem: Skin Integrity: ?Goal: Skin integrity will improve ?Outcome: Progressing ?  ?Problem: Clinical Measurements: ?Goal: Will remain free from infection ?Outcome: Progressing ?  ?Problem: Skin Integrity: ?Goal: Risk for impaired skin integrity will decrease ?Outcome: Progressing ?  ?Problem: Clinical Measurements: ?Goal: Ability to avoid or minimize complications of infection will improve ?Outcome: Completed/Met ?  ?Problem: Education: ?Goal: Knowledge of General Education information will improve ?Description: Including pain rating scale, medication(s)/side effects and non-pharmacologic comfort measures ?Outcome: Completed/Met ?  ?Problem: Health Behavior/Discharge Planning: ?Goal: Ability to manage health-related needs will improve ?Outcome: Completed/Met ?  ?Problem: Clinical Measurements: ?Goal: Ability to maintain clinical measurements within normal limits will improve ?Outcome: Completed/Met ?Goal: Diagnostic test results will improve ?Outcome: Completed/Met ?Goal: Respiratory complications will improve ?Outcome: Completed/Met ?Goal: Cardiovascular complication will be avoided ?Outcome: Completed/Met ?  ?Problem: Activity: ?Goal: Risk for activity intolerance will decrease ?Outcome: Completed/Met ?  ?Problem: Nutrition: ?Goal: Adequate nutrition will be maintained ?Outcome: Completed/Met ?  ?Problem: Coping: ?Goal: Level of anxiety will decrease ?Outcome: Completed/Met ?  ?Problem: Elimination: ?Goal: Will not experience complications related to bowel motility ?Outcome: Completed/Met ?Goal: Will not experience complications related to urinary retention ?Outcome: Completed/Met ?  ?Problem: Pain Managment: ?Goal: General experience of comfort will improve ?Outcome: Completed/Met ?  ?Problem: Safety: ?Goal: Ability to remain free from injury will improve ?Outcome: Completed/Met ?  ?

## 2021-05-30 NOTE — Care Management Important Message (Signed)
Important Message ? ?Patient Details  ?Name: Darren Allen ?MRN: [id number] ?Date of Birth: [birth year] ? ? ?Medicare Important Message Given:  N/A - LOS <3 / Initial given by admissions ? ? ? ? ?Juliann Pulse A Iram Astorino ?05/30/2021, 9:15 AM ?

## 2021-06-02 LAB — CULTURE, BLOOD (ROUTINE X 2)
Culture: NO GROWTH
Culture: NO GROWTH

## 2021-06-03 ENCOUNTER — Telehealth: Payer: Self-pay

## 2021-06-03 NOTE — Telephone Encounter (Signed)
Called patient today about April 12th appointment and needing to reschedule. Patient is still currently on strong antibiotics and will call me back when he is finished for me to get him back on Dr. Eustaquio Boyden schedule. aw ?

## 2021-06-11 ENCOUNTER — Encounter: Payer: Self-pay | Admitting: Podiatry

## 2021-06-11 ENCOUNTER — Ambulatory Visit (INDEPENDENT_AMBULATORY_CARE_PROVIDER_SITE_OTHER): Payer: HMO | Admitting: Podiatry

## 2021-06-11 ENCOUNTER — Other Ambulatory Visit: Payer: Self-pay

## 2021-06-11 DIAGNOSIS — E11621 Type 2 diabetes mellitus with foot ulcer: Secondary | ICD-10-CM

## 2021-06-11 DIAGNOSIS — M79674 Pain in right toe(s): Secondary | ICD-10-CM

## 2021-06-11 DIAGNOSIS — L97422 Non-pressure chronic ulcer of left heel and midfoot with fat layer exposed: Secondary | ICD-10-CM

## 2021-06-11 DIAGNOSIS — B351 Tinea unguium: Secondary | ICD-10-CM

## 2021-06-11 DIAGNOSIS — M79675 Pain in left toe(s): Secondary | ICD-10-CM

## 2021-06-13 ENCOUNTER — Encounter: Payer: Self-pay | Admitting: Podiatry

## 2021-06-13 NOTE — Progress Notes (Signed)
?Subjective:  ?Patient ID: Darren Allen, male    DOB: October 07, 1965,  MRN: [id number] ? ?Chief Complaint  ?Patient presents with  ? Wound Check  ?  Patient presents for follow up from hospital for left foot cellulitis   "the wound is still draining but the redness is gone and it burns and stings a lot"  ? ? ?56 y.o. male presents with the above complaint. History confirmed with patient.  He presents for follow-up from the hospital.  He says the wound is doing much better there is less drainage and the redness has improved quite a bit.  Still burns and stings quite a bit.  He had to stop the Keflex and doxycycline, he developed a rash not long after he started taking it, he says he has taken doxycycline without any issues before this was the first time he is taking Keflex.  His nails are also thickened and elongated and causing discomfort ? ?Objective:  ?Physical Exam: ?warm, good capillary refill, no trophic changes or ulcerative lesions, normal DP and PT pulses, and loss of protective sensation in the toes, full-thickness ulceration on the left dorsal medial MTPJ measuring 1.8 x 1.6 x 0.2 cm. ?Left Foot: dystrophic yellowed discolored nail plates with subungual debris ?Right Foot: dystrophic yellowed discolored nail plates with subungual debris ? ? ? ?MRI left forefoot ?IMPRESSION: ?1. Cellulitis of the dorsal forefoot without abscess or ?osteomyelitis. ?2. Small juxta-articular erosion involving the medial first proximal ?phalanx head with associated small first IP joint effusion and ?synovitis. Additional small first MTP joint effusion with synovitis. ?Findings are suspicious for gout. ?  ?ABI 05/29/2021 ?FINDINGS: ?Right ABI:  1.01 ?  ?Left ABI:  1.09 ?  ?Right Lower Extremity:  Normal arterial waveforms at the ankle. ?  ?Left Lower Extremity:  Normal arterial waveforms at the ankle. ?  ?IMPRESSION: ?Normal ankle-brachial indices ?Assessment:  ?No diagnosis found. ? ? ?Plan:  ?Patient was evaluated and treated  and all questions answered. ? ?Patient educated on diabetes. Discussed proper diabetic foot care and discussed risks and complications of disease. Educated patient in depth on reasons to return to the office immediately should he/she discover anything concerning or new on the feet. All questions answered. Discussed proper shoes as well.  ? ?Discussed the etiology and treatment options for the condition in detail with the patient. Educated patient on the topical and oral treatment options for mycotic nails. Recommended debridement of the nails today. Sharp and mechanical debridement performed of all painful and mycotic nails today. Nails debrided in length and thickness using a nail nipper to level of comfort. Discussed treatment options including appropriate shoe gear. Follow up as needed for painful nails. ? ? ? ?Ulcer left foot ?-We discussed the etiology and factors that are a part of the wound healing process.  We also discussed the risk of infection both soft tissue and osteomyelitis from open ulceration.  Discussed the risk of limb loss if this happens or worsens. ?-Debridement as below. ?-Dressed with Prisma, DSD. ?-Continue home dressing changes  3 times weekly with Prisma ?-Continue off-loading with surgical shoe. ?-Vascular testing ABIs normal limits noted above ?-HgbA1c: 7.8% on 05/28/2021 ?-Last antibiotics: Did not tolerate the Keflex, I do not see indication today for further oral antibiotics ?-Imaging: MRI reviewed, shows no signs of erosions, osteolysis, osteomyelitis or emphysema. ?-We again discussed smoking cessation and the negative impact this has on wound healing ? ?Procedure: Excisional Debridement of Wound ?Rationale: Removal of non-viable soft tissue from the wound  to promote healing.  ?Anesthesia: none ?Post-Debridement Wound Measurements: 1.6 cm x 1.8 cm x 0.2 cm  ?Type of Debridement: Sharp Excisional ?Tissue Removed: Non-viable soft tissue ?Depth of Debridement: subcutaneous  tissue. ?Technique: Sharp excisional debridement to bleeding, viable wound base.  ?Dressing: Dry, sterile, compression dressing. ?Disposition: Patient tolerated procedure well.  ? ? ? ? ?Return in about 2 weeks (around 06/25/2021) for wound care.  ? ?

## 2021-06-19 DIAGNOSIS — G629 Polyneuropathy, unspecified: Secondary | ICD-10-CM | POA: Insufficient documentation

## 2021-06-25 ENCOUNTER — Encounter: Payer: Self-pay | Admitting: Podiatry

## 2021-06-25 ENCOUNTER — Ambulatory Visit: Payer: HMO | Admitting: Podiatry

## 2021-06-25 ENCOUNTER — Ambulatory Visit: Payer: HMO | Admitting: Dermatology

## 2021-06-25 DIAGNOSIS — E11621 Type 2 diabetes mellitus with foot ulcer: Secondary | ICD-10-CM

## 2021-06-25 DIAGNOSIS — L97421 Non-pressure chronic ulcer of left heel and midfoot limited to breakdown of skin: Secondary | ICD-10-CM | POA: Diagnosis not present

## 2021-06-26 ENCOUNTER — Other Ambulatory Visit: Payer: Self-pay | Admitting: Cardiology

## 2021-06-26 ENCOUNTER — Other Ambulatory Visit (HOSPITAL_COMMUNITY): Payer: Self-pay | Admitting: Emergency Medicine

## 2021-06-26 DIAGNOSIS — R079 Chest pain, unspecified: Secondary | ICD-10-CM

## 2021-06-26 DIAGNOSIS — I10 Essential (primary) hypertension: Secondary | ICD-10-CM

## 2021-06-26 MED ORDER — IVABRADINE HCL 5 MG PO TABS: 15.0000 mg | ORAL_TABLET | Freq: Once | ORAL | 0 refills | Status: AC

## 2021-06-26 MED ORDER — METOPROLOL TARTRATE 100 MG PO TABS: 100.0000 mg | ORAL_TABLET | Freq: Once | ORAL | 0 refills | Status: DC

## 2021-06-26 NOTE — Progress Notes (Signed)
Reaching out to patient to offer assistance regarding upcoming cardiac imaging study; pt verbalizes understanding of appt date/time, parking situation and where to check in, pre-test NPO status and medications ordered, and verified current allergies; name and call back number provided for further questions should they arise ?Marchia Bond RN Navigator Cardiac Imaging ?[facility] ?[phone] office ?[phone] cell ? ?Denies iv issues ?Understands to take '100mg'$  metop + '15mg'$  ivabradine 2 hr prior to scan ?Arrival 845 ?

## 2021-06-28 ENCOUNTER — Other Ambulatory Visit: Payer: Self-pay | Admitting: Urology

## 2021-06-29 NOTE — Progress Notes (Signed)
?  Subjective:  ?Patient ID: Darren Allen, male    DOB: December 07, 1965,  MRN: [id number] ? ?Chief Complaint  ?Patient presents with  ? Wound Check  ?  "Its much better, almost healed"  ? ? ?56 y.o. male presents with the above complaint. History confirmed with patient.  Doing very well has had quite a bit of improvement and feels like it is healed at this point ? ?Objective:  ?Physical Exam: ?warm, good capillary refill, no trophic changes or ulcerative lesions, normal DP and PT pulses, and loss of protective sensation in the toes, ulceration has completely healed ?Left Foot: dystrophic yellowed discolored nail plates with subungual debris ?Right Foot: dystrophic yellowed discolored nail plates with subungual debris ? ? ? ?MRI left forefoot ?IMPRESSION: ?1. Cellulitis of the dorsal forefoot without abscess or ?osteomyelitis. ?2. Small juxta-articular erosion involving the medial first proximal ?phalanx head with associated small first IP joint effusion and ?synovitis. Additional small first MTP joint effusion with synovitis. ?Findings are suspicious for gout. ?  ?ABI 05/29/2021 ?FINDINGS: ?Right ABI:  1.01 ?  ?Left ABI:  1.09 ?  ?Right Lower Extremity:  Normal arterial waveforms at the ankle. ?  ?Left Lower Extremity:  Normal arterial waveforms at the ankle. ?  ?IMPRESSION: ?Normal ankle-brachial indices ?Assessment:  ? ?1. Diabetic ulcer of left midfoot associated with type 2 diabetes mellitus, limited to breakdown of skin ([place])   ? ? ? ?Plan:  ?Patient was evaluated and treated and all questions answered. ? ?Patient educated on diabetes. Discussed proper diabetic foot care and discussed risks and complications of disease. Educated patient in depth on reasons to return to the office immediately should he/she discover anything concerning or new on the feet. All questions answered. Discussed proper shoes as well.  ? ?Discussed the etiology and treatment options for the condition in detail with the patient. Educated  patient on the topical and oral treatment options for mycotic nails. Recommended debridement of the nails today. Sharp and mechanical debridement performed of all painful and mycotic nails today. Nails debrided in length and thickness using a nail nipper to level of comfort. Discussed treatment options including appropriate shoe gear. Follow up as needed for painful nails. ? ? ? ?Ulcer left foot ?-Overall doing very well the ulceration has completely healed at this point is epithelialized.  Advised to begin to lotion or put Vaseline on this.  I will see him back as needed we discussed the risk of recurrence and other issues as well as strict lysine control to prevent further infections and ulcerations in his foot. ? ? ? ? ?Return if symptoms worsen or fail to improve.  ? ?

## 2021-06-30 ENCOUNTER — Other Ambulatory Visit: Payer: Self-pay

## 2021-06-30 ENCOUNTER — Ambulatory Visit
Admission: RE | Admit: 2021-06-30 | Discharge: 2021-06-30 | Disposition: A | Discharge status: Home / Self Care | Payer: HMO | Source: Ambulatory Visit | Attending: Cardiology | Admitting: Cardiology

## 2021-06-30 DIAGNOSIS — R079 Chest pain, unspecified: Secondary | ICD-10-CM | POA: Insufficient documentation

## 2021-06-30 DIAGNOSIS — K449 Diaphragmatic hernia without obstruction or gangrene: Secondary | ICD-10-CM | POA: Diagnosis not present

## 2021-06-30 DIAGNOSIS — J984 Other disorders of lung: Secondary | ICD-10-CM | POA: Diagnosis not present

## 2021-06-30 DIAGNOSIS — I2510 Atherosclerotic heart disease of native coronary artery without angina pectoris: Secondary | ICD-10-CM

## 2021-06-30 IMAGING — CT CT HEART MORP W/ CTA COR W/ SCORE W/ CA W/CM &/OR W/O CM
1 of 13 series · 4 of 20 positions shown, 5 images · non-contrast
Comparison: Abdominal CT [DATE].  Chest CT [DATE]

Addendum:
CLINICAL DATA: Chest pain

EXAM:
Cardiac/Coronary  CTA
TECHNIQUE: The patient was scanned on a Siemens Somatom go.Top scanner.

[Series 5: multiphase % cta coronary 0.60 · axial · 0.42mm/px · z∈[-1156,-1054]mm · 4 of 5064 slices shown, 5 images]
[im 1013/5064  vessel]
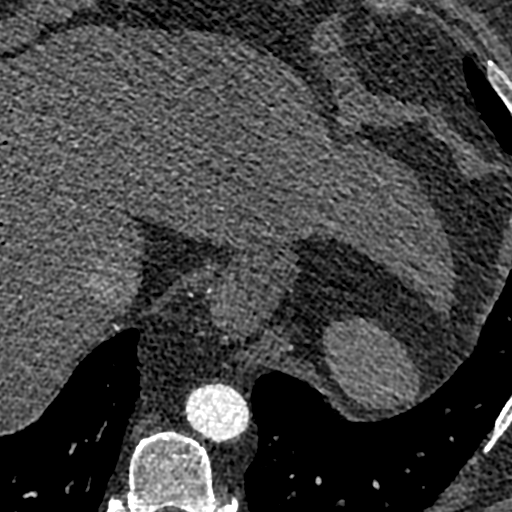
[im 1013/5064  lung]
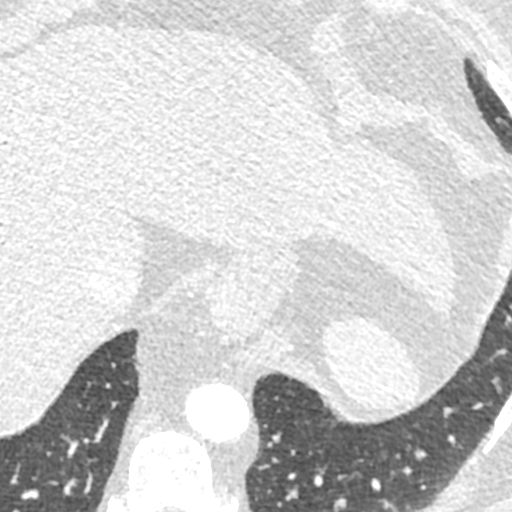
[im 2026/5064  vessel]
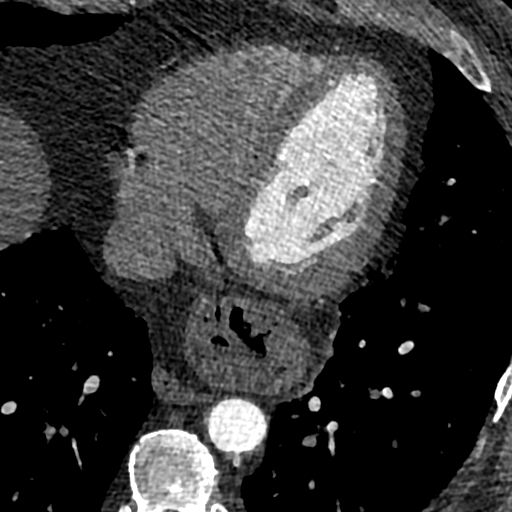
[im 3038/5064  vessel]
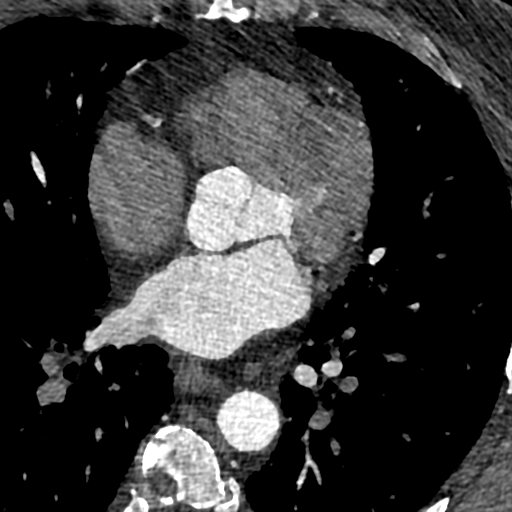
[im 4051/5064  vessel]
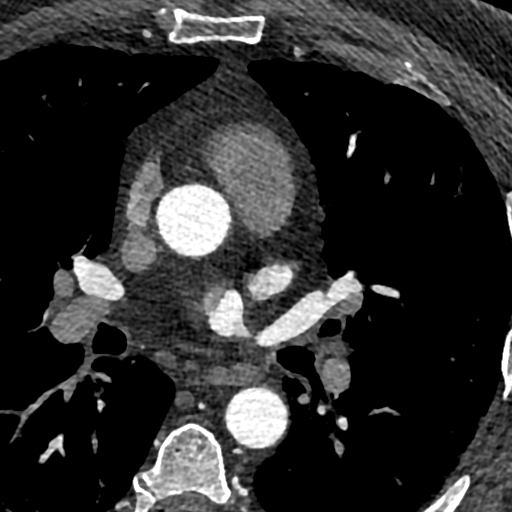

[4 of 20 positions shown; findings below may reference images not displayed]

:
A retrospective scan was triggered in the descending thoracic aorta.
Axial non-contrast 3 mm slices were carried out through the heart.
The data set was analyzed on a dedicated work station and scored
using the Agatson method. Gantry rotation speed was 330 msecs and
collimation was .6 mm. 100mg of metoprolol and 0.8 mg of sl NTG was
given. The 3D data set was reconstructed in 5% intervals of the
60-95 % of the R-R cycle. Diastolic phases were analyzed on a
dedicated work station using MPR, MIP and VRT modes. The patient
received 100 cc of contrast.
FINDINGS: Aorta:  Normal size.  No calcifications.  No dissection.

Aortic Valve:  Trileaflet.  No calcifications.

Coronary Arteries:  Normal coronary origin.  Right dominance.

RCA is a dominant artery that gives rise to PDA and PLA. There is
calcified plaque in the proximal and distal RCA causing mild
stenosis (25%).

Left main gives rise to LAD and LCX arteries. There is no LM
disease.

LAD has calcified plaque in the proximal LAD causing mild stenosis
(25-49%).

LCX is a non-dominant artery that gives rise to two obtuse marginal
branches. There is no plaque.

Other findings:

Normal pulmonary vein drainage into the left atrium.

Normal left atrial appendage without a thrombus.

Normal size of the pulmonary artery.
IMPRESSION: 1. Coronary calcium score of 216. This was 84th percentile for age
and sex matched control.

2. Normal coronary origin with right dominance.

3. Calcified plaque causing mild proximal RCA and proximal LAD
stenosis (25-49%).

4. CAD-RADS 2. Mild non-obstructive CAD (25-49%). Consider
non-atherosclerotic causes of chest pain. Consider preventive
therapy and risk factor modification.

5. Images quality degraded by motion artifacts.

EXAM:
OVER-READ INTERPRETATION  CT CHEST

The following report is a limited chest CT over-read performed by
[DATE]. This over-read does not include interpretation of cardiac
or coronary anatomy or pathology. The coronary CTA interpretation by
the cardiologist is attached.
FINDINGS: Cardiovascular: There are no significant extracardiac vascular
findings.

Mediastinum/Nodes: Small mediastinal lymph nodes are similar to
remote chest CT. There is a moderate size hiatal hernia.

Lungs/Pleura: There is no pleural effusion. Mild centrilobular and
paraseptal emphysema with chronic central airway thickening and
subpleural reticulation, similar to prior CT. 0.7 cm right perihilar
nodule on image [DATE] is unchanged from remote CT, consistent with a
benign finding. No new or enlarging nodules or acute airspace
opacities.

Upper abdomen: As above, moderate-size hiatal hernia. The visualized
upper abdomen is otherwise unremarkable.

Musculoskeletal/Chest wall: No chest wall mass or suspicious osseous
findings within the visualized chest. A nonacute left rib fractures
noted laterally.
IMPRESSION: No acute or significant extracardiac findings within the visualized
chest. Stable mild chronic lung disease and moderate-size hiatal
hernia.

*** End of Addendum ***
:
A retrospective scan was triggered in the descending thoracic aorta.
Axial non-contrast 3 mm slices were carried out through the heart.
The data set was analyzed on a dedicated work station and scored
using the Agatson method. Gantry rotation speed was 330 msecs and
collimation was .6 mm. 100mg of metoprolol and 0.8 mg of sl NTG was
given. The 3D data set was reconstructed in 5% intervals of the
60-95 % of the R-R cycle. Diastolic phases were analyzed on a
dedicated work station using MPR, MIP and VRT modes. The patient
received 100 cc of contrast.
FINDINGS: Aorta:  Normal size.  No calcifications.  No dissection.

Aortic Valve:  Trileaflet.  No calcifications.

Coronary Arteries:  Normal coronary origin.  Right dominance.

RCA is a dominant artery that gives rise to PDA and PLA. There is
calcified plaque in the proximal and distal RCA causing mild
stenosis (25%).

Left main gives rise to LAD and LCX arteries. There is no LM
disease.

LAD has calcified plaque in the proximal LAD causing mild stenosis
(25-49%).

LCX is a non-dominant artery that gives rise to two obtuse marginal
branches. There is no plaque.

Other findings:

Normal pulmonary vein drainage into the left atrium.

Normal left atrial appendage without a thrombus.

Normal size of the pulmonary artery.
IMPRESSION: 1. Coronary calcium score of 216. This was 84th percentile for age
and sex matched control.

2. Normal coronary origin with right dominance.

3. Calcified plaque causing mild proximal RCA and proximal LAD
stenosis (25-49%).

4. CAD-RADS 2. Mild non-obstructive CAD (25-49%). Consider
non-atherosclerotic causes of chest pain. Consider preventive
therapy and risk factor modification.

5. Images quality degraded by motion artifacts.

## 2021-06-30 MED ORDER — DILTIAZEM HCL 25 MG/5ML IV SOLN
10.0000 mg | Freq: Once | INTRAVENOUS | Status: AC
  Administered 2021-06-30: 10 mg via INTRAVENOUS

## 2021-06-30 MED ORDER — IOHEXOL 350 MG/ML SOLN
100.0000 mL | Freq: Once | INTRAVENOUS | Status: AC | PRN
Start: 2021-06-30 — End: 2021-06-30
  Administered 2021-06-30: 100 mL via INTRAVENOUS

## 2021-06-30 MED ORDER — METOPROLOL TARTRATE 5 MG/5ML IV SOLN
10.0000 mg | Freq: Once | INTRAVENOUS | Status: AC
  Administered 2021-06-30: 10 mg via INTRAVENOUS

## 2021-06-30 MED ORDER — NITROGLYCERIN 0.4 MG SL SUBL
0.8000 mg | SUBLINGUAL_TABLET | Freq: Once | SUBLINGUAL | Status: AC
  Administered 2021-06-30: 0.8 mg via SUBLINGUAL

## 2021-06-30 MED ORDER — NITROGLYCERIN 0.4 MG SL SUBL
0.4000 mg | SUBLINGUAL_TABLET | Freq: Once | SUBLINGUAL | Status: AC
  Administered 2021-06-30: 0.4 mg via SUBLINGUAL

## 2021-06-30 NOTE — Progress Notes (Signed)
Patient tolerated procedure well. Ambulate w/o difficulty. Denies any lightheadedness or being dizzy. Pt denies any pain at this time. Sitting in chair, drinking water provided. P is encouraged to drink additional water throughout the day and reason explained to patient. Patient verbalized understanding and all questions answered. ABC intact. No further needs at this time. Discharge from procedure area w/o issues.  °

## 2021-07-01 NOTE — Progress Notes (Signed)
? ? ?07/02/2021 ?1:21 PM  ? ?Darren Allen ?14-Aug-1965 ?[id number] ? ?Referring provider: Valera Castle, MD ?[address] ?[place],  [place] [address] ? ?Chief Complaint  ?Patient presents with  ? Benign Prostatic Hypertrophy  ? Erectile Dysfunction  ? Nephrolithiasis  ? ?Urological history: ?1.  Testosterone deficiency ?-Contributing factors of age, diabetes, former alcohol abuse and chronic pain medications ?-testosterone level pending ?-H&H levels pending ?-managed with testosterone cypionate 200 mg/mL, 1 cc every 14 days  ? ?2. BPH with LU TS ?-PSA pending ?-I PSS 2/0 ? ?3. ED ?-Contributing factors of age, BPH, diabetes, testosterone deficiency, chronic pain medications, blood pressure medications, COPD, hyperlipidemia, obesity, sleep apnea, former alcohol abuse and smoking ?-SHIM 15 ?-Managed with tadalafil 5 mg daily ? ?4.  Nephrolithiasis ?-Spontaneous passage of left ureteral stone in 2012 ? ?HPI: ?Darren Allen is a 56 y.o. male who presents today for medication refill with his friend, Patsy.  ? ?He has no urinary complaints at this visit.  Patient denies any modifying or aggravating factors.  Patient denies any gross hematuria, dysuria or suprapubic/flank pain.  Patient denies any fevers, chills, nausea or vomiting.   ? ? IPSS   ? ? [place] Name 07/02/21 1100  ?  ?  ?  ? International Prostate Symptom Score  ? How often have you had the sensation of not emptying your bladder? Not at All    ? How often have you had to urinate less than every two hours? Not at All    ? How often have you found you stopped and started again several times when you urinated? Not at All    ? How often have you found it difficult to postpone urination? Not at All    ? How often have you had a weak urinary stream? Not at All    ? How often have you had to strain to start urination? Not at All    ? How many times did you typically get up at night to urinate? 2 Times    ? Total IPSS Score 2    ?  ? Quality of Life due to  urinary symptoms  ? If you were to spend the rest of your life with your urinary condition just the way it is now how would you feel about that? Delighted    ? ?  ?  ? ?  ? ? ?Score:  ?1-7 Mild ?8-19 Moderate ?20-35 Severe  ? ?Patient still having spontaneous erections.  He denies any pain or curvature with erections.   ? SHIM   ? ? [place] Name 07/02/21 1148  ?  ?  ?  ? SHIM: Over the last 6 months:  ? How do you rate your confidence that you could get and keep an erection? Moderate    ? When you had erections with sexual stimulation, how often were your erections hard enough for penetration (entering your partner)? Sometimes (about half the time)    ? During sexual intercourse, how often were you able to maintain your erection after you had penetrated (entered) your partner? Sometimes (about half the time)    ? During sexual intercourse, how difficult was it to maintain your erection to completion of intercourse? Difficult    ? When you attempted sexual intercourse, how often was it satisfactory for you? Sometimes (about half the time)    ?  ? SHIM Total Score  ? SHIM 15    ? ?  ?  ? ?  ? ? ?  Score: ?1-7 Severe ED ?8-11 Moderate ED ?12-16 Mild-Moderate ED ?17-21 Mild ED ?22-25 No ED    ? ?PMH: ?Past Medical History:  ?Diagnosis Date  ? Calculus of kidney 02/04/2015  ? COPD (chronic obstructive pulmonary disease) ([place])   ? Diabetes mellitus without complication ([place])   ? type 2  ? Hypertension   ? Neuropathy   ? ? ?Surgical History: ?Past Surgical History:  ?Procedure Laterality Date  ? APPENDECTOMY    ? INCISION AND DRAINAGE PERIRECTAL ABSCESS N/A 02/04/2015  ? Procedure: IRRIGATION AND DEBRIDEMENT PERIRECTAL ABSCESS;  Surgeon: Marlyce Huge, MD;  Location: [facility] ORS;  Service: General;  Laterality: N/A;  ? ORIF ANKLE FRACTURE Left 03/23/2017  ? Procedure: OPEN REDUCTION INTERNAL FIXATION (ORIF) ANKLE FRACTURE;  Surgeon: Leim Fabry, MD;  Location: [facility] OR[facility];  Service: Orthopedics;  Laterality: Left;  ? RECTAL  EXAM UNDER ANESTHESIA  02/04/2015  ? Procedure: RECTAL EXAM UNDER ANESTHESIA;  Surgeon: Marlyce Huge, MD;  Location: [facility] OR[facility];  Service: General;;  ? SYNDESMOSIS REPAIR Left 03/23/2017  ? Procedure: SYNDESMOSIS REPAIR;  Surgeon: Leim Fabry, MD;  Location: [facility] OR[facility];  Service: Orthopedics;  Laterality: Left;  ? ? ?Home Medications:  ?Allergies as of 07/02/2021   ? ?   Reactions  ? Glipizide Other (See Comments), Palpitations  ? Shaky, feel bad ?Other reaction(s): Dizziness  ? Cephalexin Rash  ? Duloxetine Anxiety, Nausea Only  ? ?  ? ?  ?Medication List  ?  ? ?  ? Accurate as of July 02, 2021  1:21 PM. If you have any questions, ask your nurse or doctor.  ?  ?  ? ?  ? ?Accu-Chek Softclix Lancets lancets ?3 (three) times daily. ?  ?acetaminophen 500 MG tablet ?Commonly known as: TYLENOL ?Take 2 tablets (1,000 mg total) by mouth every 8 (eight) hours. ?  ?amitriptyline 10 MG tablet ?Commonly known as: ELAVIL ?Take 30 mg by mouth at bedtime. ?  ?ascorbic acid 500 MG tablet ?Commonly known as: VITAMIN C ?Take by mouth daily. ?  ?B-D BLUNT FILL NEEDLE 18G X 1-1/2" Misc ?Generic drug: NEEDLE (DISP) 18 G ?Use 18G needle to draw testosterone for injection ?  ?BD Disp Needles 22G X 1-1/2" Misc ?Generic drug: NEEDLE (DISP) 22 G ?every 14 (fourteen) days. ?  ?BD Eclipse Syringe 21G X 1" 3 ML Misc ?Generic drug: SYRINGE-NEEDLE (DISP) 3 ML ?Use to administer testosterone ?  ?citalopram 20 MG tablet ?Commonly known as: CELEXA ?Take 20 mg by mouth daily. ?  ?ferrous sulfate 325 (65 FE) MG tablet ?Take 325 mg by mouth daily with breakfast. ?  ?fluticasone 50 MCG/ACT nasal spray ?Commonly known as: FLONASE ?Place into both nostrils. ?  ?gabapentin 600 MG tablet ?Commonly known as: NEURONTIN ?Take 600 mg by mouth 3 (three) times daily. ?  ?HumuLIN 70/30 KwikPen (70-30) 100 UNIT/ML KwikPen ?Generic drug: insulin isophane & regular human KwikPen ?Inject 42-48 Units into the skin 2 (two) times daily with a meal. 48 units every  morning and 42 units every evening ?  ?hydrochlorothiazide 25 MG tablet ?Commonly known as: HYDRODIURIL ?Take 25 mg by mouth daily. ?  ?HYDROcodone-acetaminophen 5-325 MG tablet ?Commonly known as: NORCO/VICODIN ?Take 1 tablet by mouth 2 (two) times daily as needed. ?  ?levalbuterol 45 MCG/ACT inhaler ?Commonly known as: XOPENEX HFA ?Inhale 2 puffs into the lungs every 4 (four) hours as needed. ?  ?lidocaine-prilocaine cream ?Commonly known as: EMLA ?Apply topically once. ?  ?lisinopril 40 MG tablet ?Commonly known as: ZESTRIL ?Take 40  mg by mouth daily. ?  ?metFORMIN 500 MG 24 hr tablet ?Commonly known as: GLUCOPHAGE-XR ?Take 2 tablets by mouth daily. ?  ?metoprolol tartrate 100 MG tablet ?Commonly known as: LOPRESSOR ?Take 1 tablet (100 mg total) by mouth once for 1 dose. Please take one time dose '100mg'$  metoprolol tartrate 2 hr prior to cardiac CT for HR control IF HR >55bpm. ?  ?montelukast 10 MG tablet ?Commonly known as: SINGULAIR ?Take 10 mg by mouth daily. ?  ?nicotine 21 mg/24hr patch ?Commonly known as: NICODERM CQ - dosed in mg/24 hours ?Place 1 patch onto the skin daily. ?  ?pantoprazole 40 MG tablet ?Commonly known as: PROTONIX ?Take 80 mg by mouth daily. ?  ?ProAir HFA 108 (90 Base) MCG/ACT inhaler ?Generic drug: albuterol ?Inhale 2 puffs into the lungs every 6 (six) hours as needed. ?  ?rosuvastatin 40 MG tablet ?Commonly known as: CRESTOR ?Take 40 mg by mouth daily. ?  ?tadalafil 5 MG tablet ?Commonly known as: CIALIS ?Take 0.5-1 tablets (2.5-5 mg total) by mouth daily as needed for erectile dysfunction. ?What changed: Another medication with the same name was added. Make sure you understand how and when to take each. ?  ?tadalafil 5 MG tablet ?Commonly known as: CIALIS ?Take 1 tablet (5 mg total) by mouth daily as needed for erectile dysfunction. ?What changed: You were already taking a medication with the same name, and this prescription was added. Make sure you understand how and when to take  each. ?  ?testosterone cypionate 200 MG/ML injection ?Commonly known as: DEPOTESTOSTERONE CYPIONATE ?Inject 1 mL (200 mg total) into the muscle every 14 (fourteen) days. ?  ?Trelegy Ellipta 200-62.5-25 MCG/ACT Ae

## 2021-07-02 ENCOUNTER — Ambulatory Visit (INDEPENDENT_AMBULATORY_CARE_PROVIDER_SITE_OTHER): Payer: PPO | Admitting: Urology

## 2021-07-02 VITALS — BP 126/81 | HR 82 | Ht 74.0 in | Wt 247.0 lb

## 2021-07-02 DIAGNOSIS — Z87442 Personal history of urinary calculi: Secondary | ICD-10-CM

## 2021-07-02 DIAGNOSIS — N401 Enlarged prostate with lower urinary tract symptoms: Secondary | ICD-10-CM | POA: Diagnosis not present

## 2021-07-02 DIAGNOSIS — N529 Male erectile dysfunction, unspecified: Secondary | ICD-10-CM

## 2021-07-02 DIAGNOSIS — E291 Testicular hypofunction: Secondary | ICD-10-CM

## 2021-07-02 DIAGNOSIS — E349 Endocrine disorder, unspecified: Secondary | ICD-10-CM

## 2021-07-02 DIAGNOSIS — N138 Other obstructive and reflux uropathy: Secondary | ICD-10-CM

## 2021-07-02 DIAGNOSIS — N200 Calculus of kidney: Secondary | ICD-10-CM

## 2021-07-02 MED ORDER — TADALAFIL 5 MG PO TABS
5.0000 mg | ORAL_TABLET | Freq: Every day | ORAL | 3 refills | Status: DC | PRN
Start: 2021-07-02 — End: 2021-08-15

## 2021-07-02 MED ORDER — TESTOSTERONE CYPIONATE 200 MG/ML IM SOLN: 200.0000 mg | INTRAMUSCULAR | 0 refills | Status: DC

## 2021-07-03 LAB — TESTOSTERONE: Testosterone: 125 ng/dL — ABNORMAL LOW (ref 264–916)

## 2021-07-03 LAB — HEMOGLOBIN AND HEMATOCRIT, BLOOD
Hematocrit: 42.7 % (ref 37.5–51.0)
Hemoglobin: 13.8 g/dL (ref 13.0–17.7)

## 2021-07-03 LAB — PSA: Prostate Specific Ag, Serum: 1.3 ng/mL (ref 0.0–4.0)

## 2021-07-04 ENCOUNTER — Other Ambulatory Visit: Payer: Self-pay | Admitting: Pulmonary Disease

## 2021-07-09 NOTE — Telephone Encounter (Signed)
We have not received PA from pharmacy yet.  ?

## 2021-07-10 ENCOUNTER — Ambulatory Visit
Admission: EM | Admit: 2021-07-10 | Discharge: 2021-07-10 | Disposition: A | Discharge status: Home / Self Care | Payer: HMO | Attending: Internal Medicine | Admitting: Internal Medicine

## 2021-07-10 ENCOUNTER — Ambulatory Visit (INDEPENDENT_AMBULATORY_CARE_PROVIDER_SITE_OTHER): Payer: HMO

## 2021-07-10 DIAGNOSIS — F17210 Nicotine dependence, cigarettes, uncomplicated: Secondary | ICD-10-CM | POA: Diagnosis not present

## 2021-07-10 DIAGNOSIS — Z7951 Long term (current) use of inhaled steroids: Secondary | ICD-10-CM | POA: Insufficient documentation

## 2021-07-10 DIAGNOSIS — R0989 Other specified symptoms and signs involving the circulatory and respiratory systems: Secondary | ICD-10-CM | POA: Diagnosis not present

## 2021-07-10 DIAGNOSIS — J40 Bronchitis, not specified as acute or chronic: Secondary | ICD-10-CM

## 2021-07-10 DIAGNOSIS — J439 Emphysema, unspecified: Secondary | ICD-10-CM | POA: Diagnosis not present

## 2021-07-10 DIAGNOSIS — R0602 Shortness of breath: Secondary | ICD-10-CM | POA: Diagnosis not present

## 2021-07-10 DIAGNOSIS — R059 Cough, unspecified: Secondary | ICD-10-CM | POA: Diagnosis not present

## 2021-07-10 DIAGNOSIS — Z7985 Long-term (current) use of injectable non-insulin antidiabetic drugs: Secondary | ICD-10-CM | POA: Diagnosis not present

## 2021-07-10 DIAGNOSIS — J441 Chronic obstructive pulmonary disease with (acute) exacerbation: Secondary | ICD-10-CM | POA: Diagnosis present

## 2021-07-10 DIAGNOSIS — Z20822 Contact with and (suspected) exposure to covid-19: Secondary | ICD-10-CM | POA: Diagnosis not present

## 2021-07-10 LAB — RESP PANEL BY RT-PCR (FLU A&B, COVID) ARPGX2
Influenza A by PCR: NEGATIVE
Influenza B by PCR: NEGATIVE
SARS Coronavirus 2 by RT PCR: NEGATIVE

## 2021-07-10 IMAGING — CR DG CHEST 2V
4 series · 4 of 4 positions shown · non-contrast
Comparison: Radiograph [DATE]. Included portions from cardiac
CT [DATE]

CLINICAL DATA: Cough and shortness of breath.  Chest congestion

EXAM:
CHEST - 2 VIEW

[chest pa (1 of 2)]
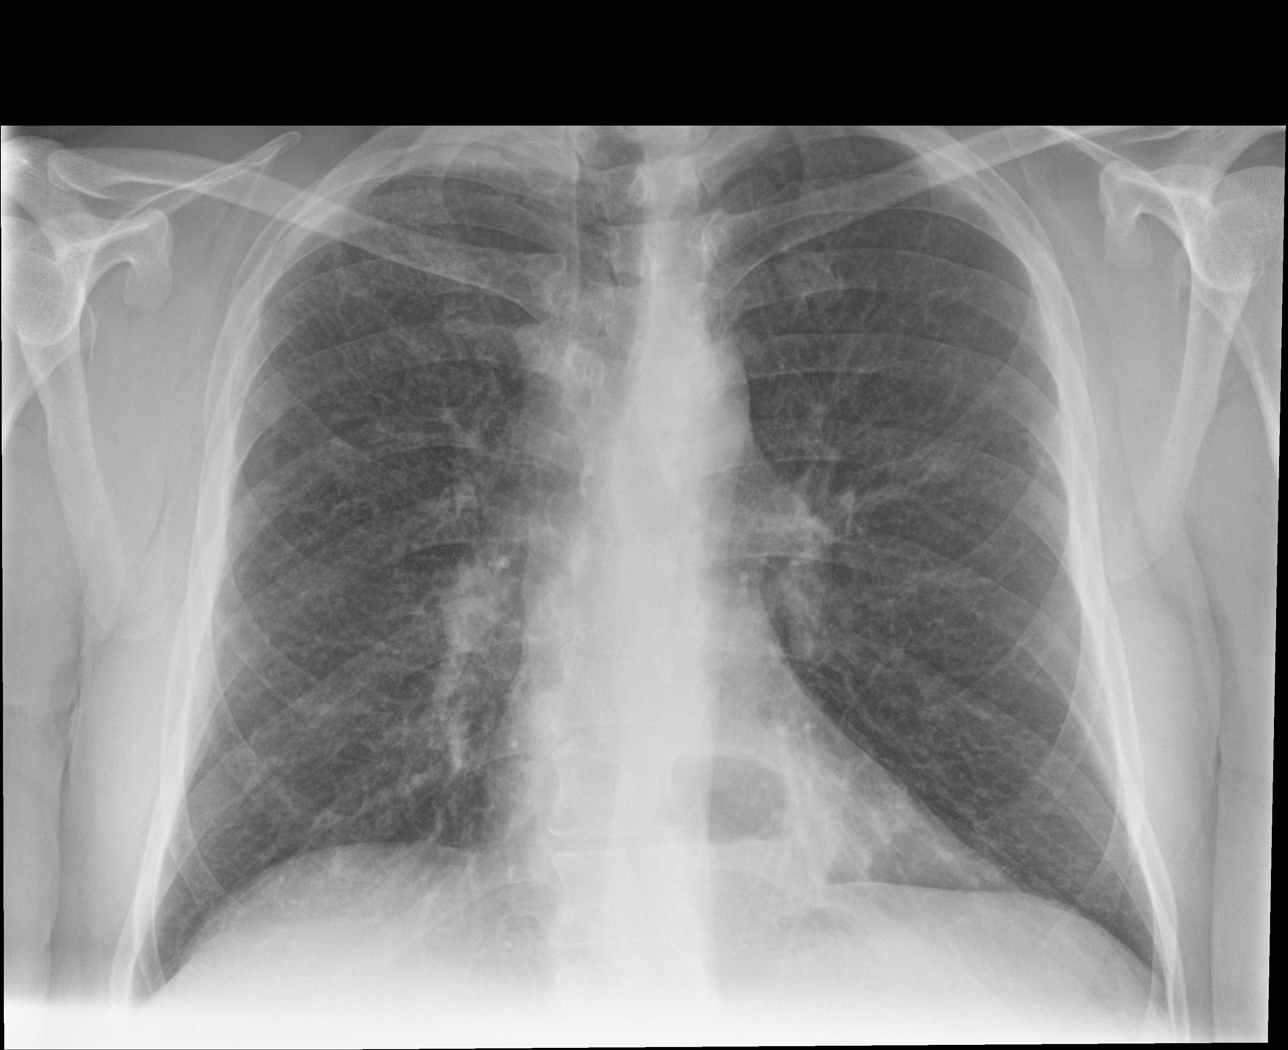

[chest lat (1 of 2)]
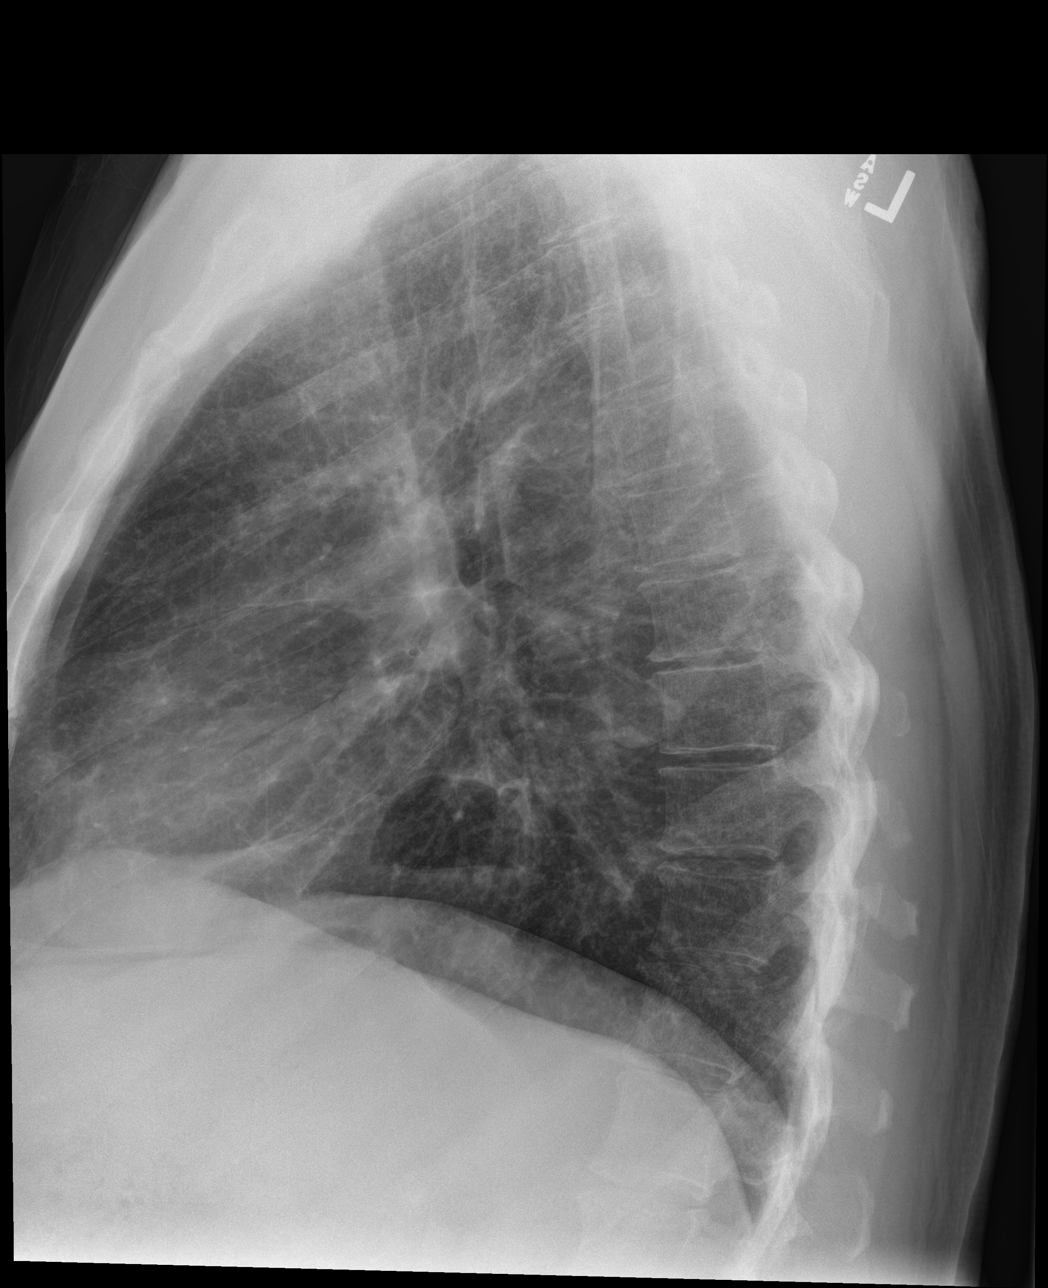

[chest pa (2 of 2)]
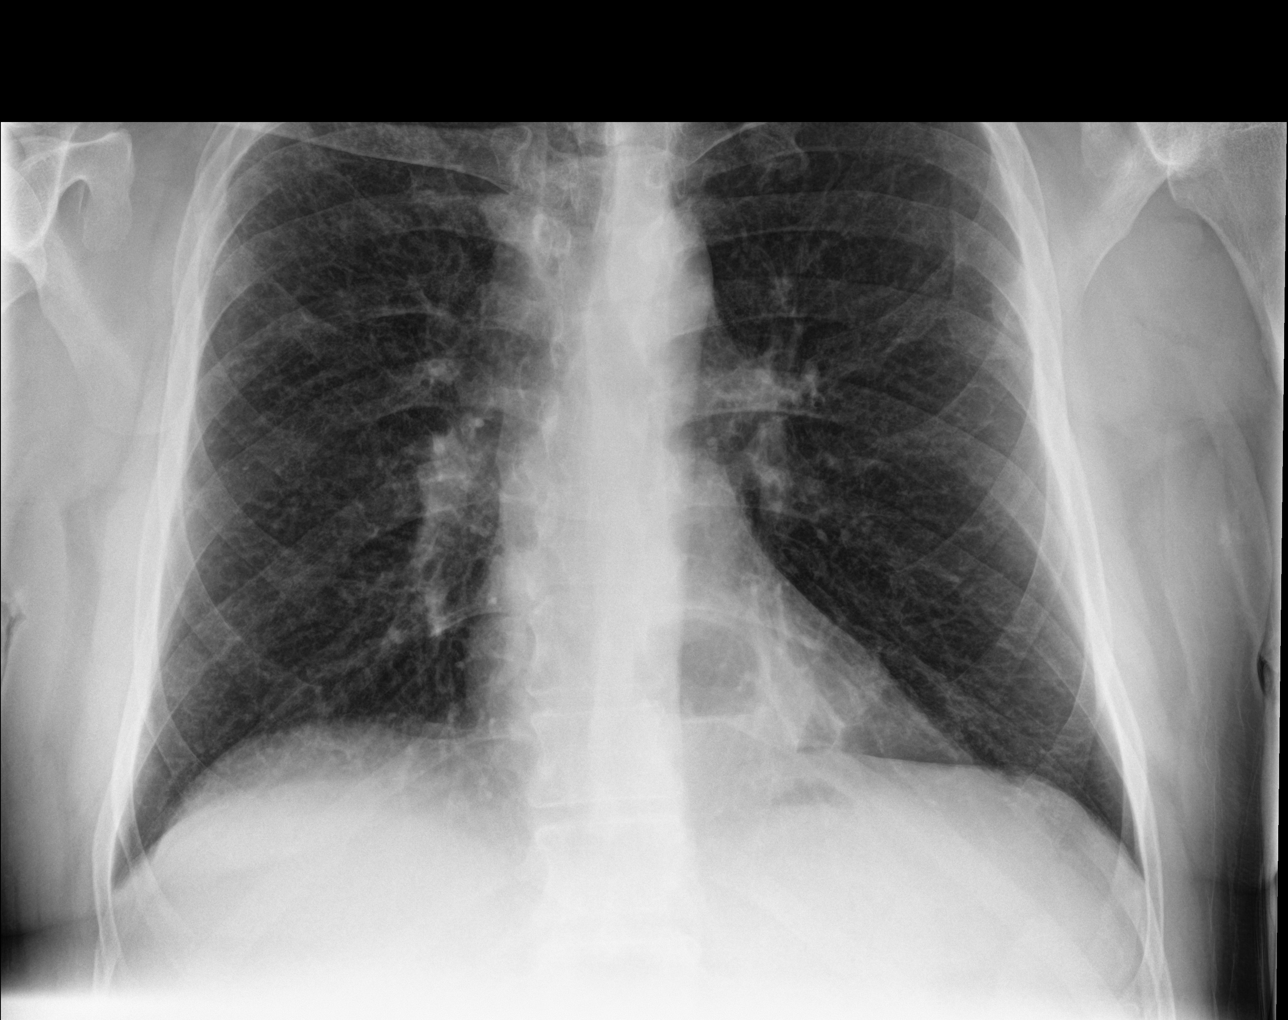

[chest lat (2 of 2)]
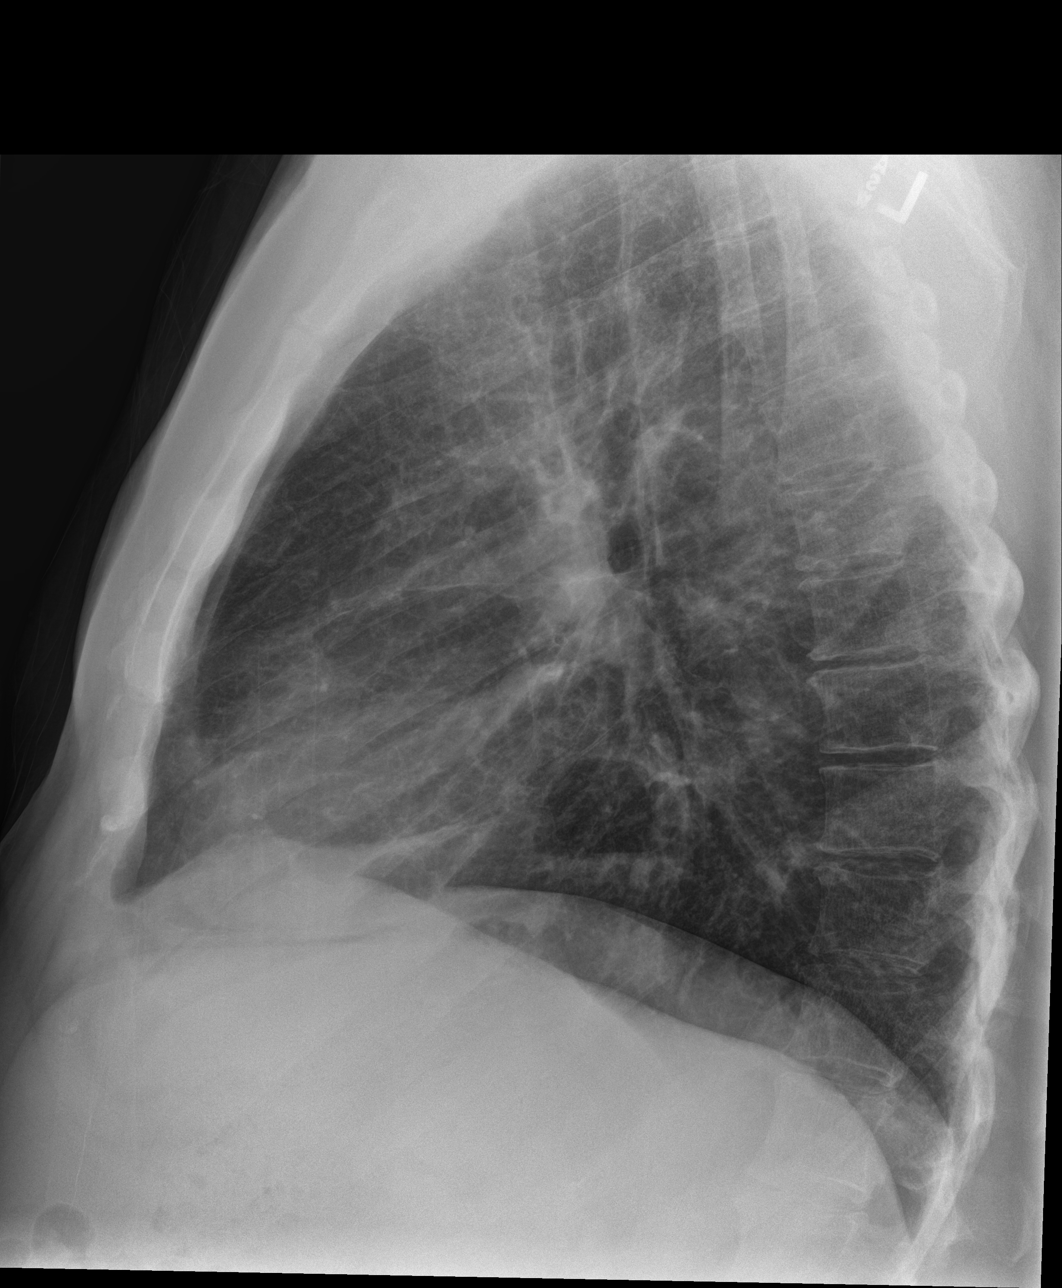

[4 of 4 positions shown; findings below may reference images not displayed]

FINDINGS: Emphysema with chronic hyperinflation and bronchial thickening.
Right greater than left apical pleuroparenchymal scarring. No acute
airspace disease. The heart is normal in size with stable
mediastinal contours. Retrocardiac hiatal hernia. No acute osseous
abnormalities are seen.
IMPRESSION: Emphysema with chronic hyperinflation and bronchial thickening. No
acute abnormality.

## 2021-07-10 MED ORDER — PREDNISONE 10 MG (21) PO TBPK: ORAL_TABLET | Freq: Every day | ORAL | 0 refills | Status: DC

## 2021-07-10 MED ORDER — DOXYCYCLINE HYCLATE 100 MG PO CAPS: 100.0000 mg | ORAL_CAPSULE | Freq: Two times a day (BID) | ORAL | 0 refills | Status: DC

## 2021-07-10 NOTE — ED Triage Notes (Signed)
Patient presents to Urgent Care with complaints of cough and chest congestion x 2 days ago. He states he has a hx of COPD and using his albuterol inhaler and neb treatment with no relief. He states he is concerned with possible infection.  ? ?Denies fever.  ?

## 2021-07-10 NOTE — ED Provider Notes (Signed)
?[facility] ? ? ? ?CSN: [id number] ?Arrival date & time: 07/10/21  1512 ? ? ?  ? ?History   ?Chief Complaint ?Chief Complaint  ?Patient presents with  ? Cough  ?  Chest congestion   ? ? ?HPI ?Darren Allen is a 56 y.o. male who presents with hx of cough x 2 days.  ?Has felt clammy, achy and weak. Coughed all night last night. Is productive of clear and gray color. Has not done covid test at home. Had had 2 covid shots.  ?Has burning sensation on throat and chest  ?Denies pain in his throat.  ?Has been using his inhalers for his COPD. Admits he still smokes a few cigarettes a day.  ?Past Medical History:  ?Diagnosis Date  ? Calculus of kidney 02/04/2015  ? COPD (chronic obstructive pulmonary disease) ([place])   ? Diabetes mellitus without complication ([place])   ? type 2  ? Hypertension   ? Neuropathy   ? ? ?Patient Active Problem List  ? Diagnosis Date Noted  ? Cellulitis of left foot 05/28/2021  ? Gout 05/28/2021  ? PVD (peripheral vascular disease) ([place]) 05/28/2021  ? Wound infection 05/28/2021  ? Hypertension   ? Neuropathy   ? Chronic ankle pain, bilateral 07/25/2020  ? Chronic bilateral low back pain without sciatica 07/25/2020  ? Diabetic peripheral neuropathy ([place]) 12/30/2019  ? Other acquired hammer toe 11/28/2019  ? Ankle fracture 03/23/2017  ? Type 2 diabetes mellitus with diabetic neuropathy, without long-term current use of insulin ([place]) 04/16/2016  ? Type 2 diabetes mellitus ([place]) 02/04/2015  ? 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 Calculus of kidney 02/04/2015  ? Perianal abscess 02/04/2015  ? Perirectal abscess   ? Chronic obstructive pulmonary disease ([place]) 01/17/2013  ? Acid reflux 01/17/2013  ? HLD (hyperlipidemia) 01/17/2013  ? Apnea, sleep 01/17/2013  ? Testicular hypofunction 01/17/2013  ? Allergic state 01/17/2013  ? Adiposity 07/06/2011  ? ? ?Past Surgical History:  ?Procedure Laterality Date  ? APPENDECTOMY    ? INCISION AND DRAINAGE PERIRECTAL ABSCESS N/A 02/04/2015  ? Procedure: IRRIGATION AND  DEBRIDEMENT PERIRECTAL ABSCESS;  Surgeon: Marlyce Huge, MD;  Location: [facility] OR[facility];  Service: General;  Laterality: N/A;  ? ORIF ANKLE FRACTURE Left 03/23/2017  ? Procedure: OPEN REDUCTION INTERNAL FIXATION (ORIF) ANKLE FRACTURE;  Surgeon: Leim Fabry, MD;  Location: [facility];  Service: Orthopedics;  Laterality: Left;  ? RECTAL EXAM UNDER ANESTHESIA  02/04/2015  ? Procedure: RECTAL EXAM UNDER ANESTHESIA;  Surgeon: Marlyce Huge, MD;  Location: [facility];  Service: General;;  ? SYNDESMOSIS REPAIR Left 03/23/2017  ? Procedure: SYNDESMOSIS REPAIR;  Surgeon: Leim Fabry, MD;  Location: [facility];  Service: Orthopedics;  Laterality: Left;  ? ? ? ? ? ?Home Medications   ? ?Prior to Admission medications   ?Medication Sig Start Date End Date Taking? Authorizing Provider  ?doxycycline (VIBRAMYCIN) 100 MG capsule Take 1 capsule (100 mg total) by mouth 2 (two) times daily. 07/10/21  Yes Rodriguez-Southworth, Sunday Spillers, PA-C  ?predniSONE (STERAPRED UNI-PAK 21 TAB) 10 MG (21) TBPK tablet Take by mouth daily. Take 6 tabs by mouth daily  for 2 days, then 5 tabs for 2 days, then 4 tabs for 2 days, then 3 tabs for 2 days, 2 tabs for 2 days, then 1 tab by mouth daily for 2 days 07/10/21  Yes Rodriguez-Southworth, Sunday Spillers, PA-C  ?Accu-Chek Softclix Lancets lancets 3 (three) times daily. 09/30/20   [provider]  ?acetaminophen (TYLENOL) 500 MG tablet Take 2 tablets (1,000 mg  total) by mouth every 8 (eight) hours. 03/24/17   Reche Dixon, PA-C  ?amitriptyline (ELAVIL) 10 MG tablet Take 30 mg by mouth at bedtime.    [provider]  ?ascorbic acid (VITAMIN C) 500 MG tablet Take by mouth daily.    [provider]  ?BD DISP NEEDLES 22G X 1-1/2" MISC every 14 (fourteen) days. 09/09/20   [provider]  ?citalopram (CELEXA) 20 MG tablet Take 20 mg by mouth daily. 09/27/20   [provider]  ?ferrous sulfate 325 (65 FE) MG tablet Take 325 mg by mouth daily with breakfast.    [provider]  ?fluticasone (FLONASE) 50 MCG/ACT nasal spray Place into both nostrils. 07/02/21   [provider]  ?gabapentin (NEURONTIN) 600 MG tablet Take 600 mg by mouth 3 (three) times daily. 03/26/21   [provider]  ?hydrochlorothiazide (HYDRODIURIL) 25 MG tablet Take 25 mg by mouth daily. 02/26/17   [provider]  ?HYDROcodone-acetaminophen (NORCO/VICODIN) 5-325 MG tablet Take 1 tablet by mouth 2 (two) times daily as needed. 11/20/20   [provider]  ?insulin isophane & regular human (HUMULIN 70/30 KWIKPEN) (70-30) 100 UNIT/ML KwikPen Inject 42-48 Units into the skin 2 (two) times daily with a meal. 48 units every morning and 42 units every evening 10/14/19   [provider]  ?levalbuterol (XOPENEX HFA) 45 MCG/ACT inhaler Inhale 2 puffs into the lungs every 4 (four) hours as needed. 04/17/21   [provider]  ?lidocaine-prilocaine (EMLA) cream Apply topically once. 07/25/20   [provider]  ?lisinopril (PRINIVIL,ZESTRIL) 40 MG tablet Take 40 mg by mouth daily. 02/26/17   [provider]  ?metFORMIN (GLUCOPHAGE-XR) 500 MG 24 hr tablet Take 2 tablets by mouth daily. 02/26/17   [provider]  ?metoprolol tartrate (LOPRESSOR) 100 MG tablet Take 1 tablet (100 mg total) by mouth once for 1 dose. Please take one time dose '100mg'$  metoprolol tartrate 2 hr prior to cardiac CT for HR control IF HR >55bpm. 06/26/21 06/26/21  Kate Sable, MD  ?montelukast (SINGULAIR) 10 MG tablet Take 10 mg by mouth daily. 07/02/21   [provider]  ?NEEDLE, DISP, 18 G (B-D BLUNT FILL NEEDLE) 18G X 1-1/2" MISC Use 18G needle to draw testosterone for injection 10/15/20   Billey Co, MD  ?nicotine (NICODERM CQ - DOSED IN MG/24 HOURS) 21 mg/24hr patch Place 1 patch onto the skin daily. 06/01/14   [provider]  ?pantoprazole (PROTONIX) 40 MG tablet Take 80 mg by mouth daily. 03/03/17   [provider]  ?PROAIR HFA 108 (90  Base) MCG/ACT inhaler Inhale 2 puffs into the lungs every 6 (six) hours as needed. 12/28/20   [provider]  ?rosuvastatin (CRESTOR) 40 MG tablet Take 40 mg by mouth daily. 09/30/20   [provider]  ?SYRINGE-NEEDLE, DISP, 3 ML (BD ECLIPSE SYRINGE) 21G X 1" 3 ML MISC Use to administer testosterone 10/15/20   Billey Co, MD  ?tadalafil (CIALIS) 5 MG tablet Take 0.5-1 tablets (2.5-5 mg total) by mouth daily as needed for erectile dysfunction. 10/15/20   Billey Co, MD  ?tadalafil (CIALIS) 5 MG tablet Take 1 tablet (5 mg total) by mouth daily as needed for erectile dysfunction. 07/02/21   Zara Council A, PA-C  ?testosterone cypionate (DEPOTESTOSTERONE CYPIONATE) 200 MG/ML injection Inject 1 mL (200 mg total) into the muscle every 14 (fourteen) days. 07/02/21   McGowan, Larene [place] A, PA-C  ?TRELEGY ELLIPTA 200-62.5-25 MCG/INH AEPB Inhale 1 puff  into the lungs daily. 10/10/20   [provider]  ?TRULICITY 1.5 WE/9.9BZ SOPN Inject 1.5 mg into the skin once a week. 05/13/21   [provider]  ? ? ?Family History ?Family History  ?Problem Relation Age of Onset  ? Cancer Mother 9  ?     Lung  ? Cancer Father   ?     Colon  ? Heart disease Father   ? Alcohol abuse Father   ? Cancer Brother 9  ?     Esophageal  ? Diabetes Brother   ? Heart disease Brother   ? ? ?Social History ?Social History  ? ?Tobacco Use  ? Smoking status: Every Day  ?  Packs/day: 0.50  ?  Years: 34.00  ?  Pack years: 17.00  ?  Types: Cigarettes  ?  Last attempt to quit: 06/15/2019  ?  Years since quitting: 2.0  ? Smokeless tobacco: Never  ? Tobacco comments:  ?  patient using Nicoderm patches  ?Vaping Use  ? Vaping Use: Never used  ?Substance Use Topics  ? Alcohol use: Not Currently  ?  Alcohol/week: 12.0 standard drinks  ?  Types: 12 Cans of beer per week  ?  Comment: Quit February 2023  ? Drug use: No  ? ? ? ?Allergies   ?Glipizide, Cephalexin, and Duloxetine ? ? ?Review of Systems ?Review of Systems   ?Constitutional:  Positive for activity change, appetite change and fatigue. Negative for chills and fever.  ?HENT:  Positive for postnasal drip. Negative for ear discharge, ear pain and sore throat.   ?Eyes:  Negativ

## 2021-07-10 NOTE — Discharge Instructions (Addendum)
Use your nebulizer every 4 hours while awake for 5 days, then only as needed  ?Do not smoke at all.  ?

## 2021-07-31 ENCOUNTER — Other Ambulatory Visit: Payer: Self-pay

## 2021-07-31 ENCOUNTER — Ambulatory Visit
Admission: EM | Admit: 2021-07-31 | Discharge: 2021-07-31 | Disposition: A | Discharge status: Home / Self Care | Payer: HMO | Attending: Emergency Medicine | Admitting: Emergency Medicine

## 2021-07-31 ENCOUNTER — Encounter: Payer: Self-pay | Admitting: Nurse Practitioner

## 2021-07-31 ENCOUNTER — Encounter: Payer: Self-pay | Admitting: *Deleted

## 2021-07-31 DIAGNOSIS — K219 Gastro-esophageal reflux disease without esophagitis: Secondary | ICD-10-CM | POA: Insufficient documentation

## 2021-07-31 DIAGNOSIS — G8929 Other chronic pain: Secondary | ICD-10-CM | POA: Diagnosis present

## 2021-07-31 DIAGNOSIS — M545 Low back pain, unspecified: Secondary | ICD-10-CM | POA: Diagnosis present

## 2021-07-31 DIAGNOSIS — R109 Unspecified abdominal pain: Secondary | ICD-10-CM | POA: Insufficient documentation

## 2021-07-31 DIAGNOSIS — R1111 Vomiting without nausea: Secondary | ICD-10-CM | POA: Diagnosis present

## 2021-07-31 LAB — URINALYSIS, ROUTINE W REFLEX MICROSCOPIC
Bilirubin Urine: NEGATIVE
Glucose, UA: NEGATIVE mg/dL
Hgb urine dipstick: NEGATIVE
Ketones, ur: NEGATIVE mg/dL
Leukocytes,Ua: NEGATIVE
Nitrite: NEGATIVE
Protein, ur: NEGATIVE mg/dL
Specific Gravity, Urine: 1.02 (ref 1.005–1.030)
pH: 5.5 (ref 5.0–8.0)

## 2021-07-31 MED ORDER — METHOCARBAMOL 500 MG PO TABS: 500.0000 mg | ORAL_TABLET | Freq: Two times a day (BID) | ORAL | 0 refills | Status: DC

## 2021-07-31 NOTE — Discharge Instructions (Addendum)
Continue to take the Protonix daily If symptoms become worse you need to be seen in the emergency room Continue to take hydrocodone as needed for pain that you are already prescribed.  You can take the Robaxin as needed cautious with driving this may make you sleepy If pain persist to lower back you will need to be seen by orthopedic for follow-up with x-rays Your urinalysis was normal today

## 2021-07-31 NOTE — ED Triage Notes (Signed)
Pt reports Lower back pain started on SAt. Pt reports back pain makes him feel Fatigued.

## 2021-07-31 NOTE — ED Provider Notes (Signed)
[facility]    CSN: [id number] Arrival date & time: 07/31/21  1413      History   Chief Complaint Chief Complaint  Patient presents with   Back Pain    HPI Darren Allen is a 56 y.o. male.   Patient presents presents with lower back pain worse than his normal chronic back pain.  He states that it is to bilateral flanks.  Denies any urinary symptoms but states his urine is darker than normal.  Denies any known injury but was doing more activity this past weekend than normal.  Patient states that he is currently on hydrocodone and normally this medication helps with the back pain and is not helping at this time.  He states that he feels fatigue intermittently and this is not his normal.  Patient also presents with emesis and uncontrolled reflux intermittently more so at nighttime when lying down for the past month.  Patient's insurance has changed and he is not able to see his normal GI so he is not able to get an appointment with a new PCP until September.  Patient also has an abdominal hernia and he is not sure if this is the cause or not.  Patient denies any chest pain no shortness of breath.  Patient is currently taking Protonix 80 mg with minimal relief.   Past Medical History:  Diagnosis Date   Calculus of kidney 02/04/2015   COPD (chronic obstructive pulmonary disease) ([facility])    Diabetes mellitus without complication ([place])    type 2   Hypertension    Neuropathy     Patient Active Problem List   Diagnosis Date Noted   Cellulitis of left foot 05/28/2021   Gout 05/28/2021   PVD (peripheral vascular disease) ([place]) 05/28/2021   Wound infection 05/28/2021   Hypertension    Neuropathy    Chronic ankle pain, bilateral 07/25/2020   Chronic bilateral low back pain without sciatica 07/25/2020   Diabetic peripheral neuropathy ([place]) 12/30/2019   Other acquired hammer toe 11/28/2019   Ankle fracture 03/23/2017   Type 2 diabetes mellitus with diabetic neuropathy,  without long-term current use of insulin ([place]) 04/16/2016   Type 2 diabetes mellitus ([place]) 02/04/2015   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   Calculus of kidney 02/04/2015   Perianal abscess 02/04/2015   Perirectal abscess    Chronic obstructive pulmonary disease ([place]) 01/17/2013   Acid reflux 01/17/2013   HLD (hyperlipidemia) 01/17/2013   Apnea, sleep 01/17/2013   Testicular hypofunction 01/17/2013   Allergic state 01/17/2013   Adiposity 07/06/2011    Past Surgical History:  Procedure Laterality Date   APPENDECTOMY     INCISION AND DRAINAGE PERIRECTAL ABSCESS N/A 02/04/2015   Procedure: IRRIGATION AND DEBRIDEMENT PERIRECTAL ABSCESS;  Surgeon: Marlyce Huge, MD;  Location: [facility];  Service: General;  Laterality: N/A;   ORIF ANKLE FRACTURE Left 03/23/2017   Procedure: OPEN REDUCTION INTERNAL FIXATION (ORIF) ANKLE FRACTURE;  Surgeon: Leim Fabry, MD;  Location: [facility];  Service: Orthopedics;  Laterality: Left;   RECTAL EXAM UNDER ANESTHESIA  02/04/2015   Procedure: RECTAL EXAM UNDER ANESTHESIA;  Surgeon: Marlyce Huge, MD;  Location: [facility];  Service: General;;   SYNDESMOSIS REPAIR Left 03/23/2017   Procedure: SYNDESMOSIS REPAIR;  Surgeon: Leim Fabry, MD;  Location: [facility];  Service: Orthopedics;  Laterality: Left;       Home Medications    Prior to Admission medications   Medication Sig Start Date End Date Taking? Authorizing Provider  methocarbamol (ROBAXIN) 500 MG tablet  Take 1 tablet (500 mg total) by mouth 2 (two) times daily. 07/31/21  Yes Marney Setting, NP  Accu-Chek Softclix Lancets lancets 3 (three) times daily. 09/30/20   [provider]  acetaminophen (TYLENOL) 500 MG tablet Take 2 tablets (1,000 mg total) by mouth every 8 (eight) hours. 03/24/17   Reche Dixon, PA-C  amitriptyline (ELAVIL) 10 MG tablet Take 30 mg by mouth at bedtime.    [provider]  ascorbic acid (VITAMIN C) 500 MG tablet Take by mouth daily.    [provider]  BD DISP NEEDLES 22G X 1-1/2" MISC every 14 (fourteen) days. 09/09/20   [provider]  citalopram (CELEXA) 20 MG tablet Take 20 mg by mouth daily. 09/27/20   [provider]  doxycycline (VIBRAMYCIN) 100 MG capsule Take 1 capsule (100 mg total) by mouth 2 (two) times daily. 07/10/21   Rodriguez-Southworth, Sunday Spillers, PA-C  ferrous sulfate 325 (65 FE) MG tablet Take 325 mg by mouth daily with breakfast.    [provider]  fluticasone (FLONASE) 50 MCG/ACT nasal spray Place into both nostrils. 07/02/21   [provider]  gabapentin (NEURONTIN) 600 MG tablet Take 600 mg by mouth 3 (three) times daily. 03/26/21   [provider]  hydrochlorothiazide (HYDRODIURIL) 25 MG tablet Take 25 mg by mouth daily. 02/26/17   [provider]  HYDROcodone-acetaminophen (NORCO/VICODIN) 5-325 MG tablet Take 1 tablet by mouth 2 (two) times daily as needed. 11/20/20   [provider]  insulin isophane & regular human (HUMULIN 70/30 KWIKPEN) (70-30) 100 UNIT/ML KwikPen Inject 42-48 Units into the skin 2 (two) times daily with a meal. 48 units every morning and 42 units every evening 10/14/19   [provider]  levalbuterol (XOPENEX HFA) 45 MCG/ACT inhaler Inhale 2 puffs into the lungs every 4 (four) hours as needed. 04/17/21   [provider]  lidocaine-prilocaine (EMLA) cream Apply topically once. 07/25/20   [provider]  lisinopril (PRINIVIL,ZESTRIL) 40 MG tablet Take 40 mg by mouth daily. 02/26/17   [provider]  metFORMIN (GLUCOPHAGE-XR) 500 MG 24 hr tablet Take 2 tablets by mouth daily. 02/26/17   [provider]  metoprolol tartrate (LOPRESSOR) 100 MG tablet Take 1 tablet (100 mg total) by mouth once for 1 dose. Please take one time dose '100mg'$  metoprolol tartrate 2 hr prior to cardiac CT for HR control IF HR >55bpm. 06/26/21 06/26/21  Kate Sable, MD  montelukast (SINGULAIR) 10 MG tablet Take 10 mg by mouth  daily. 07/02/21   [provider]  NEEDLE, DISP, 18 G (B-D BLUNT FILL NEEDLE) 18G X 1-1/2" MISC Use 18G needle to draw testosterone for injection 10/15/20   Billey Co, MD  nicotine (NICODERM CQ - DOSED IN MG/24 HOURS) 21 mg/24hr patch Place 1 patch onto the skin daily. 06/01/14   [provider]  pantoprazole (PROTONIX) 40 MG tablet Take 80 mg by mouth daily. 03/03/17   [provider]  predniSONE (STERAPRED UNI-PAK 21 TAB) 10 MG (21) TBPK tablet Take by mouth daily. Take 6 tabs by mouth daily  for 2 days, then 5 tabs for 2 days, then 4 tabs for 2 days, then 3 tabs for 2 days, 2 tabs for 2 days, then 1 tab by mouth daily for 2 days 07/10/21   Rodriguez-Southworth, Sunday Spillers, PA-C  PROAIR HFA 108 (90 Base) MCG/ACT inhaler Inhale 2 puffs into the lungs every 6 (six) hours as needed. 12/28/20   [provider]  rosuvastatin (  CRESTOR) 40 MG tablet Take 40 mg by mouth daily. 09/30/20   [provider]  SYRINGE-NEEDLE, DISP, 3 ML (BD ECLIPSE SYRINGE) 21G X 1" 3 ML MISC Use to administer testosterone 10/15/20   Billey Co, MD  tadalafil (CIALIS) 5 MG tablet Take 0.5-1 tablets (2.5-5 mg total) by mouth daily as needed for erectile dysfunction. 10/15/20   Billey Co, MD  tadalafil (CIALIS) 5 MG tablet Take 1 tablet (5 mg total) by mouth daily as needed for erectile dysfunction. 07/02/21   Zara Council A, PA-C  testosterone cypionate (DEPOTESTOSTERONE CYPIONATE) 200 MG/ML injection Inject 1 mL (200 mg total) into the muscle every 14 (fourteen) days. 07/02/21   McGowan, Shannon A, PA-C  TRELEGY ELLIPTA 200-62.5-25 MCG/INH AEPB Inhale 1 puff into the lungs daily. 10/10/20   [provider]  TRULICITY 1.5 YP/9.5KD SOPN Inject 1.5 mg into the skin once a week. 05/13/21   [provider]    Family History Family History  Problem Relation Age of Onset   Cancer Mother 85       Lung   Cancer Father        Colon   Heart disease Father     Alcohol abuse Father    Cancer Brother 9       Esophageal   Diabetes Brother    Heart disease Brother     Social History Social History   Tobacco Use   Smoking status: Every Day    Packs/day: 0.50    Years: 34.00    Pack years: 17.00    Types: Cigarettes    Last attempt to quit: 06/15/2019    Years since quitting: 2.1   Smokeless tobacco: Never   Tobacco comments:    patient using Nicoderm patches  Vaping Use   Vaping Use: Never used  Substance Use Topics   Alcohol use: Not Currently    Alcohol/week: 12.0 standard drinks    Types: 12 Cans of beer per week    Comment: Quit February 2023   Drug use: No     Allergies   Glipizide, Cephalexin, and Duloxetine   Review of Systems Review of Systems  Constitutional:  Positive for fatigue. Negative for activity change, appetite change, chills and fever.  Eyes: Negative.   Respiratory:  Negative for shortness of breath.        Patient has chronic COPD but denies any respiratory issues today  Cardiovascular: Negative.   Gastrointestinal:  Positive for vomiting. Negative for abdominal distention, abdominal pain, anal bleeding, blood in stool and constipation.       Complaining of intermittent reflux and burning sensation with some emesis at nighttime when he is lying down.  Denies any chest pain no shortness of breath.  History of abdominal hernia  Genitourinary:  Positive for flank pain. Negative for dysuria and frequency.  Musculoskeletal:        Lower back flank pain bilateral.  Neurological: Negative.     Physical Exam Triage Vital Signs ED Triage Vitals [07/31/21 1501]  Enc Vitals Group     BP (!) 140/94     Pulse Rate 88     Resp 20     Temp 98.6 F (37 C)     Temp src      SpO2 99 %     Weight      Height      Head Circumference      Peak Flow      Pain Score 4  Pain Loc      Pain Edu?      Excl. in [place]?    No data found.  Updated Vital Signs BP (!) 140/94   Pulse 88   Temp 98.6 F (37 C)    Resp 20   SpO2 99%   Visual Acuity Right Eye Distance:   Left Eye Distance:   Bilateral Distance:    Right Eye Near:   Left Eye Near:    Bilateral Near:     Physical Exam Constitutional:      Appearance: Normal appearance.  HENT:     Nose: Nose normal.  Eyes:     Pupils: Pupils are equal, round, and reactive to light.  Cardiovascular:     Rate and Rhythm: Normal rate.  Pulmonary:     Effort: Pulmonary effort is normal.  Abdominal:     Tenderness: There is right CVA tenderness and left CVA tenderness.  Musculoskeletal:        General: Tenderness present. No swelling or signs of injury.     Right lower leg: No edema.     Left lower leg: No edema.     Comments: Lower back pain radiates to bilateral flanks.  Nonradiating.  Skin:    General: Skin is warm.     Capillary Refill: Capillary refill takes less than 2 seconds.  Neurological:     General: No focal deficit present.     Mental Status: He is alert.     UC Treatments / Results  Labs (all labs ordered are listed, but only abnormal results are displayed) Labs Reviewed  URINALYSIS, [place] MICROSCOPIC    EKG   Radiology No results found.  Procedures Procedures (including critical care time)  Medications Ordered in UC Medications - No data to display  Initial Impression / Assessment and Plan / UC Course  I have reviewed the triage vital signs and the nursing notes.  Pertinent labs & imaging results that were available during my care of the patient were reviewed by me and considered in my medical decision making (see chart for details).     Patient will need to see GI for hernia follow-up or reflux uncontrolled.  Will need to check with insurance company to see who is covered underneath [employer].  Continue to take the Protonix daily If symptoms become worse you need to be seen in the emergency room Continue to take hydrocodone as needed for pain that you are already prescribed.  You can  take the Robaxin as needed cautious with driving this may make you sleepy If pain persist to lower back you will need to be seen by orthopedic for follow-up with x-rays Final Clinical Impressions(s) / UC Diagnoses   Final diagnoses:  Chronic bilateral low back pain without sciatica  Vomiting without nausea, unspecified vomiting type  Gastroesophageal reflux disease, unspecified whether esophagitis present     Discharge Instructions      Continue to take the Protonix daily If symptoms become worse you need to be seen in the emergency room Continue to take hydrocodone as needed for pain that you are already prescribed.  You can take the Robaxin as needed cautious with driving this may make you sleepy If pain persist to lower back you will need to be seen by orthopedic for follow-up with x-rays Your urinalysis was normal today     ED Prescriptions     Medication Sig Dispense Auth. Provider   methocarbamol (ROBAXIN) 500 MG tablet Take 1 tablet (  500 mg total) by mouth 2 (two) times daily. 20 tablet Marney Setting, NP      PDMP not reviewed this encounter.   Marney Setting, NP 07/31/21 1556

## 2021-08-01 ENCOUNTER — Other Ambulatory Visit: Payer: Self-pay | Admitting: Pulmonary Disease

## 2021-08-01 DIAGNOSIS — J42 Unspecified chronic bronchitis: Secondary | ICD-10-CM

## 2021-08-04 ENCOUNTER — Other Ambulatory Visit: Payer: Self-pay

## 2021-08-14 NOTE — Progress Notes (Unsigned)
There were no vitals taken for this visit.   Subjective:    Patient ID: Darren Allen, male    DOB: 05/10/1965, 56 y.o.   MRN: [id number]  HPI: Darren Allen is a 56 y.o. male  No chief complaint on file.  Patient presents to clinic to establish care with new PCP.  Introduced to Designer, jewellery role and practice setting.  All questions answered.  Discussed provider/patient relationship and expectations.  Patient reports a history of ***. Patient denies a history of: Hypertension, Elevated Cholesterol, Diabetes, Thyroid problems, Depression, Anxiety, Neurological problems, and Abdominal problems.   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   Calculus of kidney 02/04/2015   Adiposity 07/06/2011   Apnea, sleep 01/17/2013   Testicular hypofunction 01/17/2013   Allergic state 01/17/2013   Perirectal abscess    Perianal abscess 02/04/2015   Ankle fracture 03/23/2017   Cellulitis of left foot 05/28/2021   Hypertension    Neuropathy    Diabetic peripheral neuropathy ([place]) 12/30/2019   Type 2 diabetes mellitus with diabetic neuropathy, without long-term current use of insulin ([place]) 04/16/2016   Gout 05/28/2021   PVD (peripheral vascular disease) ([place]) 05/28/2021   Wound infection 05/28/2021   Chronic ankle pain, bilateral 07/25/2020   Chronic bilateral low back pain without sciatica 07/25/2020   Other acquired hammer toe 11/28/2019   Resolved Ambulatory Problems    Diagnosis Date Noted   No Resolved Ambulatory Problems   Past Medical History:  Diagnosis Date   COPD (chronic obstructive pulmonary disease) ([place])    Diabetes mellitus without complication ([place])    Past Surgical History:  Procedure Laterality Date   APPENDECTOMY     INCISION AND DRAINAGE PERIRECTAL ABSCESS  N/A 02/04/2015   Procedure: IRRIGATION AND DEBRIDEMENT PERIRECTAL ABSCESS;  Surgeon: Marlyce Huge, MD;  Location: [facility] ORS;  Service: General;  Laterality: N/A;   ORIF ANKLE FRACTURE Left 03/23/2017   Procedure: OPEN REDUCTION INTERNAL FIXATION (ORIF) ANKLE FRACTURE;  Surgeon: Leim Fabry, MD;  Location: [facility] OR[facility];  Service: Orthopedics;  Laterality: Left;   RECTAL EXAM UNDER ANESTHESIA  02/04/2015   Procedure: RECTAL EXAM UNDER ANESTHESIA;  Surgeon: Marlyce Huge, MD;  Location: [facility] OR[facility];  Service: General;;   SYNDESMOSIS REPAIR Left 03/23/2017   Procedure: SYNDESMOSIS REPAIR;  Surgeon: Leim Fabry, MD;  Location: [facility] OR[facility];  Service: Orthopedics;  Laterality: Left;   Family History  Problem Relation Age of Onset   Cancer Mother 28       Lung   Cancer Father        Colon   Heart disease Father    Alcohol abuse Father    Cancer Brother 57       Esophageal   Diabetes Brother    Heart disease Brother      Review of Systems  Per HPI unless specifically indicated above     Objective:    There were no vitals taken for this visit.  Wt Readings from Last 3 Encounters:  07/02/21 247 lb (112 kg)  05/28/21 250 lb (113.4 kg)  05/22/21 250 lb (113.4 kg)    Physical Exam  Results for orders placed or performed during the hospital encounter of 07/31/21  Urinalysis, Routine w reflex microscopic Urine, Clean Catch  Result Value Ref Range   Color, Urine YELLOW YELLOW  APPearance CLEAR CLEAR   Specific Gravity, Urine 1.020 1.005 - 1.030   pH 5.5 5.0 - 8.0   Glucose, UA NEGATIVE NEGATIVE mg/dL   Hgb urine dipstick NEGATIVE NEGATIVE   Bilirubin Urine NEGATIVE NEGATIVE   Ketones, ur NEGATIVE NEGATIVE mg/dL   Protein, ur NEGATIVE NEGATIVE mg/dL   Nitrite NEGATIVE NEGATIVE   Leukocytes,Ua NEGATIVE NEGATIVE      Assessment & Plan:   Problem List Items Addressed This Visit       Cardiovascular and Mediastinum   Hypertension - Primary   PVD (peripheral vascular  disease) ([place])     Respiratory   Chronic obstructive pulmonary disease ([place])     Follow up plan: No follow-ups on file.

## 2021-08-15 ENCOUNTER — Encounter: Payer: Self-pay | Admitting: Nurse Practitioner

## 2021-08-15 ENCOUNTER — Ambulatory Visit (INDEPENDENT_AMBULATORY_CARE_PROVIDER_SITE_OTHER): Payer: PPO | Admitting: Nurse Practitioner

## 2021-08-15 VITALS — BP 108/73 | HR 106 | Temp 98.6°F | Ht 72.44 in | Wt 245.4 lb

## 2021-08-15 DIAGNOSIS — I10 Essential (primary) hypertension: Secondary | ICD-10-CM | POA: Diagnosis not present

## 2021-08-15 DIAGNOSIS — E1140 Type 2 diabetes mellitus with diabetic neuropathy, unspecified: Secondary | ICD-10-CM | POA: Diagnosis not present

## 2021-08-15 DIAGNOSIS — E782 Mixed hyperlipidemia: Secondary | ICD-10-CM

## 2021-08-15 DIAGNOSIS — I739 Peripheral vascular disease, unspecified: Secondary | ICD-10-CM

## 2021-08-15 DIAGNOSIS — J449 Chronic obstructive pulmonary disease, unspecified: Secondary | ICD-10-CM | POA: Diagnosis not present

## 2021-08-15 DIAGNOSIS — E1169 Type 2 diabetes mellitus with other specified complication: Secondary | ICD-10-CM

## 2021-08-15 DIAGNOSIS — E1142 Type 2 diabetes mellitus with diabetic polyneuropathy: Secondary | ICD-10-CM

## 2021-08-15 NOTE — Assessment & Plan Note (Signed)
Chronic.  Has SOB and wheezing.  Followed by Pulmonology at [facility].  Continue with Trellegy, Singulair and PRN albuterol.

## 2021-08-15 NOTE — Assessment & Plan Note (Signed)
Chronic. On Gabapentin, Amitriptyline, and vicodin.  Has an appointment with Pain Management in June.  He had to switch from [facility] due to his insurance changing.  Keep appointment with Pain Management.

## 2021-08-15 NOTE — Assessment & Plan Note (Signed)
Chronic.  Controlled.  Continue with current medication regimen of Crestor '40mg'$  daily.  Labs ordered today.  Return to clinic in 6 months for reevaluation.  Call sooner if concerns arise.

## 2021-08-15 NOTE — Assessment & Plan Note (Signed)
Chornic. Improved.  Last a1c was two months ago and was 7.8.  On Trulicity, Metformin and Insulin 70/30 BID.  Followed by Endocrinology at [facility].  Will draw labs at next visit.

## 2021-08-15 NOTE — Assessment & Plan Note (Signed)
Chronic. Ongoing.  On Gabapentin, amtriptyline, and Vicodin.  Has an appointment with pain management this month.

## 2021-08-15 NOTE — Assessment & Plan Note (Signed)
Chronic.  Controlled.  Continue with current medication regimen.  Will draw labs at next visit.  Return to clinic in 2 weeks for reevaluation.  Call sooner if concerns arise.

## 2021-08-18 ENCOUNTER — Ambulatory Visit
Admission: RE | Admit: 2021-08-18 | Discharge: 2021-08-18 | Disposition: A | Discharge status: Home / Self Care | Payer: HMO | Source: Ambulatory Visit | Attending: Pulmonary Disease | Admitting: Pulmonary Disease

## 2021-08-18 DIAGNOSIS — J42 Unspecified chronic bronchitis: Secondary | ICD-10-CM

## 2021-08-19 ENCOUNTER — Other Ambulatory Visit: Payer: PPO

## 2021-08-19 DIAGNOSIS — E349 Endocrine disorder, unspecified: Secondary | ICD-10-CM

## 2021-08-19 DIAGNOSIS — N401 Enlarged prostate with lower urinary tract symptoms: Secondary | ICD-10-CM

## 2021-08-19 DIAGNOSIS — N138 Other obstructive and reflux uropathy: Secondary | ICD-10-CM

## 2021-08-20 ENCOUNTER — Encounter: Payer: Self-pay | Admitting: Nurse Practitioner

## 2021-08-20 LAB — HEMOGLOBIN AND HEMATOCRIT, BLOOD
Hematocrit: 39.3 % (ref 37.5–51.0)
Hemoglobin: 13.1 g/dL (ref 13.0–17.7)

## 2021-08-20 LAB — TESTOSTERONE: Testosterone: 387 ng/dL (ref 264–916)

## 2021-08-20 LAB — PSA: Prostate Specific Ag, Serum: 1.4 ng/mL (ref 0.0–4.0)

## 2021-08-25 ENCOUNTER — Encounter: Payer: Self-pay | Admitting: Nurse Practitioner

## 2021-08-25 ENCOUNTER — Ambulatory Visit: Payer: Self-pay | Admitting: *Deleted

## 2021-08-25 ENCOUNTER — Ambulatory Visit (INDEPENDENT_AMBULATORY_CARE_PROVIDER_SITE_OTHER): Payer: PPO | Admitting: Nurse Practitioner

## 2021-08-25 VITALS — BP 125/79 | HR 98 | Temp 98.2°F | Wt 247.4 lb

## 2021-08-25 DIAGNOSIS — S81802A Unspecified open wound, left lower leg, initial encounter: Secondary | ICD-10-CM | POA: Diagnosis not present

## 2021-08-25 MED ORDER — TRIAMCINOLONE ACETONIDE 0.1 % EX CREA: 1.0000 "application " | TOPICAL_CREAM | Freq: Two times a day (BID) | CUTANEOUS | 0 refills | Status: DC

## 2021-08-25 NOTE — Telephone Encounter (Signed)
Reason for Disposition  [1] SEVERE local itching (i.e., interferes with work, school, sleep) AND [2] not improved after 24 hours of hydrocortisone cream  Answer Assessment - Initial Assessment Questions 1. TYPE of INSECT: "What type of insect was it?"      I'm assuming a spider bit me on left leg 5 times.   I was trimming bushes on Sat.   My wife didn't get bit and we were both trimming. 2. ONSET: "When did you get bitten?"      Sat. 3. LOCATION: "Where is the insect bite located?"      5 places on left leg.   One bite has a hole in it.   No drainage.   It's itching about 2 inches around it.   Benadryl lotion not helping. 4. REDNESS: "Is the area red or pink?" If Yes, ask: "What size is area of redness?" (inches or cm). "When did the redness start?"     It's itching a lot.    3/4 inch bumps that are raised up. 5. PAIN: "Is there any pain?" If Yes, ask: "How bad is it?"  (Scale 1-10; or mild, moderate, severe)     No pain.   Sore to touch only 6. ITCHING: "Does it itch?" If Yes, ask: "How bad is the itch?"    - MILD: doesn't interfere with normal activities   - MODERATE-SEVERE: interferes with work, school, sleep, or other activities      Yes a lot of itching 7. SWELLING: "How big is the swelling?" (inches, cm, or compare to coins)     It's raised 8. OTHER SYMPTOMS: "Do you have any other symptoms?"  (e.g., difficulty breathing, hives)     I have diabetes.    9. PREGNANCY: "Is there any chance you are pregnant?" "When was your last menstrual period?"     N/A  Protocols used: Insect Bite-A-AH

## 2021-08-25 NOTE — Telephone Encounter (Signed)
  Chief Complaint: 5 possible spider bites on left leg that itch real bad Symptoms: Bitten on Sat.  Red raised areas that are tender to touch but not painful otherwise.   Itching a lot. Frequency: Bitten on Sat. Pertinent Negatives: Patient denies drainage or burning Disposition: '[]'$ ED /'[]'$ Urgent Care (no appt availability in office) / '[x]'$ Appointment(In office/virtual)/ '[]'$  [facility]/ '[]'$ Home Care/ '[]'$ Refused Recommended Disposition /'[]'$ [facility]/ '[]'$  Follow-up with PCP Additional Notes: Appt made for today at 4:00 with Jon Billings, NP

## 2021-08-25 NOTE — Progress Notes (Signed)
BP 125/79   Pulse 98   Temp 98.2 F (36.8 C) (Oral)   Wt 247 lb 6.4 oz (112.2 kg)   SpO2 97%   BMI 33.15 kg/m    Subjective:    Patient ID: Darren Allen, male    DOB: [birth year], 56 y.o.   MRN: [id number]  HPI: Darren Allen is a 56 y.o. male  Chief Complaint  Patient presents with   Insect Bite    Pt reports Saturday morning woke up with multiple insect bites over his legs. Pt reports nausea and fatigue   Pt reports Saturday morning woke up with multiple insect bites over his legs. Pt reports nausea and fatigue.  Doesn't have an idea of what it was that bite him.  States they have had some spiders in the house.     Relevant past medical, surgical, family and social history reviewed and updated as indicated. Interim medical history since our last visit reviewed. Allergies and medications reviewed and updated.  Review of Systems  Skin:        Insect bites     Per HPI unless specifically indicated above     Objective:    BP 125/79   Pulse 98   Temp 98.2 F (36.8 C) (Oral)   Wt 247 lb 6.4 oz (112.2 kg)   SpO2 97%   BMI 33.15 kg/m   Wt Readings from Last 3 Encounters:  08/25/21 247 lb 6.4 oz (112.2 kg)  08/15/21 245 lb 6.4 oz (111.3 kg)  07/02/21 247 lb (112 kg)    Physical Exam Vitals and nursing note reviewed.  Constitutional:      General: He is not in acute distress.    Appearance: Normal appearance. He is not ill-appearing, toxic-appearing or diaphoretic.  HENT:     Head: Normocephalic.     Right Ear: External ear normal.     Left Ear: External ear normal.     Nose: Nose normal. No congestion or rhinorrhea.     Mouth/Throat:     Mouth: Mucous membranes are moist.  Eyes:     General:        Right eye: No discharge.        Left eye: No discharge.     Extraocular Movements: Extraocular movements intact.     Conjunctiva/sclera: Conjunctivae normal.     Pupils: Pupils are equal, round, and reactive to light.  Cardiovascular:     Rate and  Rhythm: Normal rate and regular rhythm.     Heart sounds: No murmur heard. Pulmonary:     Effort: Pulmonary effort is normal. No respiratory distress.     Breath sounds: Normal breath sounds. No wheezing, rhonchi or rales.  Abdominal:     General: Abdomen is flat. Bowel sounds are normal.  Musculoskeletal:     Cervical back: Normal range of motion and neck supple.  Skin:    General: Skin is warm and dry.     Capillary Refill: Capillary refill takes less than 2 seconds.       Neurological:     General: No focal deficit present.     Mental Status: He is alert and oriented to person, place, and time.  Psychiatric:        Mood and Affect: Mood normal.        Behavior: Behavior normal.        Thought Content: Thought content normal.        Judgment: Judgment normal.     Results  for orders placed or performed in visit on 08/19/21  Hemoglobin and hematocrit, blood  Result Value Ref Range   Hemoglobin 13.1 13.0 - 17.7 g/dL   Hematocrit 39.3 37.5 - 51.0 %  PSA  Result Value Ref Range   Prostate Specific Ag, Serum 1.4 0.0 - 4.0 ng/mL  Testosterone  Result Value Ref Range   Testosterone 387 264 - 916 ng/dL      Assessment & Plan:   Problem List Items Addressed This Visit   None Visit Diagnoses     Wound of left lower extremity, initial encounter    -  Primary   Several insect bites on lower leg. Will treat with Doxycyline due to symptoms. can't take Keflex. Will give triamcinalone for symptoms. FU if not improved.        Follow up plan: Return if symptoms worsen or fail to improve.

## 2021-08-29 ENCOUNTER — Other Ambulatory Visit: Payer: PPO

## 2021-09-01 ENCOUNTER — Ambulatory Visit: Payer: HMO | Admitting: Gastroenterology

## 2021-09-04 ENCOUNTER — Encounter: Payer: PPO | Admitting: Nurse Practitioner

## 2021-09-06 NOTE — Progress Notes (Signed)
Patient: Darren Allen  Service Category: E/M  Provider: Oswaldo Done, MD  DOB: February 23, 1966  DOS: 09/08/2021  Referring Provider: Dione Housekeeper, *  MRN: [id number]  Setting: Ambulatory outpatient  PCP: Larae Grooms, NP  Type: New Patient  Specialty: Interventional Pain Management    Location: Office  Delivery: Face-to-face     Primary Reason(s) for Visit: Encounter for initial evaluation of one or more chronic problems (new to examiner) potentially causing chronic pain, and posing a threat to normal musculoskeletal function. (Level of risk: High) CC: Back Pain (low), Ankle Pain (left), and Foot Pain (Bilateral soles)  HPI  [name] is a 56 y.o. year old, male patient, who comes for the first time to our practice referred by Dione Housekeeper, * for our initial evaluation of his chronic pain. He has Chronic obstructive pulmonary disease (HCC); Type 2 diabetes mellitus (HCC); Acid reflux; HLD (hyperlipidemia); Eunuchoidism; Calculus of kidney; Adiposity; Apnea, sleep; Testicular hypofunction; Allergic state; Perirectal abscess; Perianal abscess; Ankle fracture; Hypertension; Neuropathy; Diabetic peripheral neuropathy (HCC); Type 2 diabetes mellitus with diabetic neuropathy, without long-term current use of insulin (HCC); Gout; PVD (peripheral vascular disease) (HCC); Chronic ankle pain, bilateral; Chronic low back pain (1ry area of Pain) (Bilateral) (R>L) w/o sciatica; Other acquired hammer toe; Chronic pain syndrome; Pharmacologic therapy; Disorder of skeletal system; Problems influencing health status; Lumbar facet syndrome; Lumbosacral radiculopathy at S1 (Left); 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 Today he comes in for evaluation of his Back Pain (low), Ankle Pain (left), and Foot  Pain (Bilateral soles)  Pain Assessment: Location: Lower, Right, Left Back Radiating: buttocks Onset: More than a month ago Duration: Chronic pain Quality: Sharp, Constant, Shooting Severity: 4 /10 (subjective, self-reported pain score)  Effect on ADL: The patient refers currently being on disability secondary to his COPD. Timing: Constant Modifying factors: position changes, opiods, gabapentin BP: (!) 136/91  HR: (!) 113  Onset and Duration: Date of onset: 1998 Cause of pain: Unknown Severity: Getting worse, NAS-11 at its worse: 8/10, NAS-11 at its best: 4/10, NAS-11 now: 4/10, and NAS-11 on the average: 6/10 Timing: Afternoon, Night, During activity or exercise, and After activity or exercise Aggravating Factors: Bending, Climbing, Kneeling, Lifiting, Squatting, Stooping , Walking, Walking uphill, and Walking downhill Alleviating Factors: Lying down, Medications, Resting, and Sitting Associated Problems: Fatigue, Numbness, Spasms, Tingling, and Pain that wakes patient up Quality of Pain: Aching, Burning, Constant, Deep, Sharp, Throbbing, and Tingling Previous Examinations or Tests: MRI scan, Nerve conduction test, and Psychiatric evaluation Previous Treatments: Narcotic medications and Physical Therapy  According to the patient the primary area of pain is that of the lower back (Midline) (Bilateral). (R>L) the patient denies any surgeries or physical therapy.  He also denies any nerve blocks but does indicate having recently had an MRI of the lumbar spine.  He describes the pain as being referred towards the buttocks areas, but denies any pain going down the leg.  The patient's secondary area pain is that of his feet (Bilateral) (R=L).  He describes the pain as being in the bottom of his feet and what would appear to be an S1 dermatomal pain.  He describes having diabetic peripheral neuropathy but he also states that while he was a patient at [facility] pain [facility] they attempted to treat him  with Qutenza patches, but not only where they  have very painful, but he also did not get any benefit whatsoever.  He refers this treatment to have been done approximately 6 months ago.  He also states having had a nerve conduction test at the [facility] and the results according to their chart indicate that he has a generalized sensorimotor peripheral neuropathy with a superimposed left S1 radiculopathy.  The patient indicates that he has seen the medical psychologist at the [facility] pain [facility] on several occasions and there were talking to him about the possibility of a spinal cord stimulator trial.  He indicates being interested in this possibility.  The patient's third area of pain is that of the left ankle where he indicates having had a fracture approximately 3 years ago with subsequent surgery.  He refers having had physical therapy after the surgery but he continues to have pain in his left ankle on foot.  Psychosocial: The patient indicates that he is currently on disability secondary to his COPD.  He confesses that he is still smoking.  He also indicates that he has had a difficult time secondary to the fact that he lost his daughter last year.  Pharmacotherapy: The patient indicates taking gabapentin 600 mg p.o. 3 times daily and hydrocodone/APAP 5/325 tablet, 1 tab p.o. twice daily.  He refers that he was a patient at the [facility] pain [facility] until recently when his insurance changed and they did not cover the [employer].  For this reason he has had to look for another pain specialist.  Physical exam: The patient was able to heel walk and toe walk without any problems.  Straight leg raise was negative bilaterally.  Provocative Patrick maneuver was negative bilaterally for sacroiliac joint or hip joint arthralgia.  Hyperextension and rotation maneuver as well as Marden Noble maneuver were both positive bilaterally for ipsilateral lumbar facet arthralgia.  Today I took the time to provide the patient  with information regarding my pain practice. The patient was informed that my practice is divided into two sections: an interventional pain management section, as well as a completely separate and distinct medication management section. I explained that I have procedure days for my interventional therapies, and evaluation days for follow-ups and medication management. Because of the amount of documentation required during both, they are kept separated. This means that there is the possibility that he may be scheduled for a procedure on one day, and medication management the next. I have also informed him that because of staffing and facility limitations, I no longer take patients for medication management only. To illustrate the reasons for this, I gave the patient the example of surgeons, and how inappropriate it would be to refer a patient to his/her care, just to write for the post-surgical antibiotics on a surgery done by a different surgeon.   Because interventional pain management is my board-certified specialty, the patient was informed that joining my practice means that they are open to any and all interventional therapies. I made it clear that this does not mean that they will be forced to have any procedures done. What this means is that I believe interventional therapies to be essential part of the diagnosis and proper management of chronic pain conditions. Therefore, patients not interested in these interventional alternatives will be better served under the care of a different practitioner.  The patient was also made aware of my Comprehensive Pain Management Safety Guidelines where by joining my practice, they limit all of their nerve blocks and joint injections to those  done by our practice, for as long as we are retained to manage their care.   Historic Controlled Substance Pharmacotherapy Review  PMP and historical list of controlled substances: Hydrocodone/APAP 5/325 tablet, 1 tab p.o. twice  daily (last filled on 08/19/2021); testosterone 200 mg/mL; pregabalin 100 mg capsule, 1 cap p.o. twice daily (last filled 10/21/2020) Current opioid analgesics: Hydrocodone/APAP 5/325 tablet, 1 tab p.o. twice daily (last filled on 08/19/2021) MME/day: 10 mg/day  Historical Monitoring: The patient  reports no history of drug use. List of all UDS Test(s): Lab Results  Component Value Date   ETH 179 (H) 03/22/2017   ETH 23 (H) 12/10/2016   List of other Serum/Urine Drug Screening Test(s):  Lab Results  Component Value Date   ETH 179 (H) 03/22/2017   ETH 23 (H) 12/10/2016   Historical Background Evaluation: [place] PMP: PDMP reviewed during this encounter. Review of the past 44-months conducted.             PMP NARX Score Report:  Narcotic: 361 Sedative: 220 Stimulant: 000 [place] Department of public safety, offender search: Engineer, mining Information) Non-contributory Risk Assessment Profile: Aberrant behavior: None observed or detected today Risk factors for fatal opioid overdose: None identified today PMP NARX Overdose Risk Score: 320 Fatal overdose hazard ratio (HR): Calculation deferred Non-fatal overdose hazard ratio (HR): Calculation deferred Risk of opioid abuse or dependence: 0.7-3.0% with doses ? 36 MME/day and 6.1-26% with doses ? 120 MME/day. Substance use disorder (SUD) risk level: See below Personal History of Substance Abuse (SUD-Substance use disorder):  Alcohol:    Illegal Drugs:    Rx Drugs:    ORT Risk Level calculation:    ORT Scoring interpretation table:  Score <3 = Low Risk for SUD  Score between 4-7 = Moderate Risk for SUD  Score >8 = High Risk for Opioid Abuse   PHQ-2 Depression Scale:  Total score: 0  PHQ-2 Scoring interpretation table: (Score and probability of major depressive disorder)  Score 0 = No depression  Score 1 = 15.4% Probability  Score 2 = 21.1% Probability  Score 3 = 38.4% Probability  Score 4 = 45.5% Probability  Score 5 = 56.4% Probability  Score 6  = 78.6% Probability   PHQ-9 Depression Scale:  Total score: 0  PHQ-9 Scoring interpretation table:  Score 0-4 = No depression  Score 5-9 = Mild depression  Score 10-14 = Moderate depression  Score 15-19 = Moderately severe depression  Score 20-27 = Severe depression (2.4 times higher risk of SUD and 2.89 times higher risk of overuse)   Pharmacologic Plan: As per protocol, I have not taken over any controlled substance management, pending the results of ordered tests and/or consults.            Initial impression: Pending review of available data and ordered tests.  Meds   Current Outpatient Medications:    Accu-Chek Softclix Lancets lancets, 3 (three) times daily., Disp: , Rfl:    acetaminophen (TYLENOL) 500 MG tablet, Take 2 tablets (1,000 mg total) by mouth every 8 (eight) hours., Disp: 30 tablet, Rfl: 0   amitriptyline (ELAVIL) 10 MG tablet, Take 30 mg by mouth at bedtime., Disp: , Rfl:    ascorbic acid (VITAMIN C) 500 MG tablet, Take by mouth daily., Disp: , Rfl:    BD DISP NEEDLES 22G X 1-1/2" MISC, every 14 (fourteen) days., Disp: , Rfl:    ferrous sulfate 325 (65 FE) MG tablet, Take 325 mg by mouth daily with breakfast., Disp: ,  Rfl:    fluticasone (FLONASE) 50 MCG/ACT nasal spray, Place into both nostrils., Disp: , Rfl:    gabapentin (NEURONTIN) 600 MG tablet, Take 600 mg by mouth 3 (three) times daily., Disp: , Rfl:    hydrochlorothiazide (HYDRODIURIL) 25 MG tablet, Take 25 mg by mouth daily., Disp: , Rfl: 7   HYDROcodone-acetaminophen (NORCO/VICODIN) 5-325 MG tablet, Take 1 tablet by mouth 2 (two) times daily as needed., Disp: , Rfl:    insulin isophane & regular human (HUMULIN 70/30 KWIKPEN) (70-30) 100 UNIT/ML KwikPen, Inject 42-48 Units into the skin 2 (two) times daily with a meal. 48 units every morning and 42 units every evening, Disp: , Rfl:    lisinopril (PRINIVIL,ZESTRIL) 40 MG tablet, Take 40 mg by mouth daily., Disp: , Rfl: 11   metFORMIN (GLUCOPHAGE-XR) 500 MG 24 hr  tablet, Take 4 tablets by mouth daily., Disp: , Rfl: 11   metoprolol tartrate (LOPRESSOR) 100 MG tablet, Take 1 tablet (100 mg total) by mouth once for 1 dose. Please take one time dose 100mg  metoprolol tartrate 2 hr prior to cardiac CT for HR control IF HR >55bpm., Disp: 1 tablet, Rfl: 0   montelukast (SINGULAIR) 10 MG tablet, Take 10 mg by mouth daily., Disp: , Rfl:    NEEDLE, DISP, 18 G (B-D BLUNT FILL NEEDLE) 18G X 1-1/2" MISC, Use 18G needle to draw testosterone for injection, Disp: 50 each, Rfl: 0   pantoprazole (PROTONIX) 40 MG tablet, Take 80 mg by mouth daily., Disp: , Rfl: 11   PROAIR HFA 108 (90 Base) MCG/ACT inhaler, Inhale 2 puffs into the lungs every 6 (six) hours as needed., Disp: , Rfl:    rosuvastatin (CRESTOR) 40 MG tablet, Take 40 mg by mouth daily., Disp: , Rfl:    SYRINGE-NEEDLE, DISP, 3 ML (BD ECLIPSE SYRINGE) 21G X 1" 3 ML MISC, Use to administer testosterone, Disp: 50 each, Rfl: 0   testosterone cypionate (DEPOTESTOSTERONE CYPIONATE) 200 MG/ML injection, Inject 1 mL (200 mg total) into the muscle every 14 (fourteen) days., Disp: 10 mL, Rfl: 0   TRELEGY ELLIPTA 200-62.5-25 MCG/INH AEPB, Inhale 1 puff into the lungs daily., Disp: , Rfl:    triamcinolone cream (KENALOG) 0.1 %, Apply 1 application  topically 2 (two) times daily., Disp: 30 g, Rfl: 0   TRULICITY 1.5 MG/0.5ML SOPN, Inject 1.5 mg into the skin once a week., Disp: , Rfl:   Imaging Review  Cervical Imaging: Cervical DG complete: Results for orders placed during the hospital encounter of 10/01/20 DG Cervical Spine Complete  Narrative CLINICAL DATA:  Neck pain after fall.  EXAM: CERVICAL SPINE - COMPLETE 4+ VIEW  COMPARISON:  None.  FINDINGS: There is no evidence of cervical spine fracture or prevertebral soft tissue swelling. Alignment is normal. No other significant bone abnormalities are identified.  IMPRESSION: Negative cervical spine radiographs.   Electronically Signed By: Lupita Raider  M.D. On: 10/01/2020 16:42  Lumbosacral Imaging: Lumbar MR wo contrast: Results for orders placed during the hospital encounter of 03/11/20 MR LUMBAR SPINE WO CONTRAST  Narrative CLINICAL DATA:  Progressive chronic low back pain. Radiating into bilateral legs, right worse than left.  EXAM: MRI LUMBAR SPINE WITHOUT CONTRAST  TECHNIQUE: Multiplanar, multisequence MR imaging of the lumbar spine was performed. No intravenous contrast was administered.  COMPARISON:  Lumbar radiographs January 26, 2020.  FINDINGS: Segmentation: The inferior-most fully formed intervertebral disc is labeled L5-S1.  Alignment:  Physiologic.  Vertebrae:  Vertebral body heights are maintained.  Conus medullaris and  cauda equina: Conus extends to the L1 level. Conus appears normal.  Paraspinal and other soft tissues: Unremarkable.  Disc levels:  T12-L1: No significant disc protrusion, foraminal stenosis, or canal stenosis.  L1-L2: No significant disc protrusion, foraminal stenosis, or canal stenosis.  L2-L3: Mild disc bulging without significant canal or foraminal stenosis.  L3-L4: No significant disc protrusion, foraminal stenosis, or canal stenosis.  L4-L5: Mild broad disc bulge without significant canal or foraminal stenosis.  L5-S1: Disc desiccation and height loss. Broad-based disc bulge with superimposed central disc protrusion. No significant central canal or foraminal stenosis. Mild left subarticular recess stenosis  IMPRESSION: Mild multilevel degenerative change (as detailed above) without significant canal or foraminal stenosis. Mild left subarticular recess stenosis at L5-S1.   Electronically Signed By: Feliberto Harts MD On: 03/11/2020 12:19  Lumbar DG (Complete) 4+V: Results for orders placed during the hospital encounter of 10/01/20 DG Lumbar Spine Complete  Narrative CLINICAL DATA:  Low back pain after fall today.  EXAM: LUMBAR SPINE - COMPLETE 4+  VIEW  COMPARISON:  January 26, 2020.  FINDINGS: There is no evidence of lumbar spine fracture. Alignment is normal. Mild degenerative disc disease is noted at L2-3 with anterior osteophyte formation.  IMPRESSION: Mild degenerative disc disease is noted at L2-3. No acute abnormality is noted   Electronically Signed By: Lupita Raider M.D. On: 10/01/2020 16:40  Foot Imaging: Foot-L DG Complete: Results for orders placed during the hospital encounter of 05/27/21 DG Foot Complete Left  Narrative CLINICAL DATA:  A 56 year old male presents for evaluation of suspected foot infection, history of diabetes and previous ankle surgery. Greatest area of pain first metatarsophalangeal joint.  EXAM: LEFT FOOT - COMPLETE 3+ VIEW  COMPARISON:  None aside from ankle imaging from 2019.  FINDINGS: Signs of disuse osteopenia.  Erosive changes about the tip of the distal phalanx of the second digit with overhanging, well corticated margins.  Erosion along the distal aspect of the medial distal proximal phalanx of the great toe which is less well corticated. No signs of acute fracture. No signs of dislocation.  Soft tissue swelling about the great toe.  Irregularity of the proximal phalanx of the fifth digit. Suggest prior fracture of the distal aspect of the proximal phalanx of the fifth digit.  IMPRESSION: 1. Soft tissue swelling about the great toe. 2. Erosive changes about the distal phalanx of the second digit and also along the distal aspect of the proximal phalanx of the great toe. Findings about the great toe raising the question of early osteomyelitis in the setting of soft tissue swelling. Findings about the second digit are better corticated than would be expected and show overhanging margins. Perhaps related to prior infection given well corticated margins. Correlate with any overlying signs of infection currently. MRI may be helpful for further evaluation. 3.  Fracture of the proximal phalanx of the fifth digit of uncertain chronicity. Potentially subacute.   Electronically Signed By: Donzetta Kohut M.D. On: 05/27/2021 16:01  Hand Imaging: Hand-L DG Complete: Results for orders placed during the hospital encounter of 12/10/16 DG Hand Complete Left  Narrative CLINICAL DATA:  Status post fall last night following a syncopal episode. Persistent left hand pain involving the palm in third digit.  EXAM: LEFT HAND - COMPLETE 3+ VIEW  COMPARISON:  None in PACs  FINDINGS: The bones are subjectively adequately mineralized. There is no acute fracture nor dislocation. The soft tissues are unremarkable. The interphalangeal and MCP joint spaces are reasonably well-maintained. The  observed portions of the wrist exhibit no acute abnormalities. The soft tissues are unremarkable.  IMPRESSION: There is no acute bony abnormality of the left hand.   Electronically Signed By: David  [place] M.D. On: 12/10/2016 14:55  Complexity Note: Imaging results reviewed. Results shared with [name], using Layman's terms.                        ROS  Cardiovascular: Daily Aspirin intake and High blood pressure Pulmonary or Respiratory: Lung problems, Wheezing and difficulty taking a deep full breath (Asthma), Difficulty blowing air out (Emphysema), Shortness of breath, Smoking, Snoring , and Temporary stoppage of breathing during sleep Neurological: Abnormal skin sensations (Peripheral Neuropathy) Psychological-Psychiatric: Anxiousness and Depressed Gastrointestinal: Heartburn due to stomach pushing into lungs (Hiatal hernia) and Reflux or heatburn Genitourinary: Kidney disease and Passing kidney stones Hematological: No reported hematological signs or symptoms such as prolonged bleeding, low or poor functioning platelets, bruising or bleeding easily, hereditary bleeding problems, low energy levels due to low hemoglobin or being anemic Endocrine: High blood  sugar requiring insulin (IDDM) Rheumatologic: No reported rheumatological signs and symptoms such as fatigue, joint pain, tenderness, swelling, redness, heat, stiffness, decreased range of motion, with or without associated rash Musculoskeletal: Negative for myasthenia gravis, muscular dystrophy, multiple sclerosis or malignant hyperthermia Work History: Disabled  Allergies  [name] is allergic to glipizide, cephalexin, and duloxetine.  Laboratory Chemistry Profile   Renal Lab Results  Component Value Date   BUN 19 05/30/2021   CREATININE 1.19 05/30/2021   GFRAA 59 (L) 12/07/2019   GFRNONAA >60 05/30/2021   PROTEINUR NEGATIVE 07/31/2021     Electrolytes Lab Results  Component Value Date   NA 138 05/30/2021   K 3.7 05/30/2021   CL 101 05/30/2021   CALCIUM 9.0 05/30/2021   MG 1.8 12/10/2016   PHOS 3.2 04/01/2010     Hepatic Lab Results  Component Value Date   AST 38 05/28/2021   ALT 33 05/28/2021   ALBUMIN 4.1 05/28/2021   ALKPHOS 62 05/28/2021     ID Lab Results  Component Value Date   HIV Non Reactive 05/29/2021   SARSCOV2NAA NEGATIVE 07/10/2021   STAPHAUREUS NEGATIVE 03/23/2017   MRSAPCR NEGATIVE 03/23/2017     Bone Lab Results  Component Value Date   TESTOSTERONE 387 08/19/2021     Endocrine Lab Results  Component Value Date   GLUCOSE 130 (H) 05/30/2021   GLUCOSEU NEGATIVE 07/31/2021   HGBA1C 7.8 (H) 05/28/2021   TSH 1.240 04/01/2010   TESTOSTERONE 387 08/19/2021     Neuropathy Lab Results  Component Value Date   HGBA1C 7.8 (H) 05/28/2021   HIV Non Reactive 05/29/2021     CNS No results found for: "COLORCSF", "APPEARCSF", "RBCCOUNTCSF", "WBCCSF", "POLYSCSF", "LYMPHSCSF", "EOSCSF", "PROTEINCSF", "GLUCCSF", "JCVIRUS", "CSFOLI", "IGGCSF", "LABACHR", "ACETBL"   Inflammation (CRP: Acute  ESR: Chronic) Lab Results  Component Value Date   ESRSEDRATE 10 05/27/2018   LATICACIDVEN 1.7 05/28/2021     Rheumatology Lab Results  Component Value  Date   LABURIC 6.8 05/28/2021     Coagulation Lab Results  Component Value Date   INR 0.90 03/22/2017   LABPROT 12.1 03/22/2017   APTT 26 03/22/2017   PLT 227 05/30/2021     Cardiovascular Lab Results  Component Value Date   BNP 23.0 02/11/2015   CKTOTAL 477 (H) 09/25/2019   CKMB 2.4 04/02/2010   TROPONINI <0.03 12/10/2016   HGB 13.1 08/19/2021   HCT 39.3 08/19/2021  Screening Lab Results  Component Value Date   SARSCOV2NAA NEGATIVE 07/10/2021   STAPHAUREUS NEGATIVE 03/23/2017   MRSAPCR NEGATIVE 03/23/2017   HIV Non Reactive 05/29/2021     Cancer No results found for: "CEA", "CA125", "LABCA2"   Allergens No results found for: "ALMOND", "APPLE", "ASPARAGUS", "AVOCADO", "BANANA", "BARLEY", "BASIL", "BAYLEAF", "GREENBEAN", "LIMABEAN", "WHITEBEAN", "BEEFIGE", "REDBEET", "BLUEBERRY", "BROCCOLI", "CABBAGE", "MELON", "CARROT", "CASEIN", "CASHEWNUT", "CAULIFLOWER", "CELERY"     Note: Lab results reviewed.  PFSH  Drug: [name]  reports no history of drug use. Alcohol:  reports that he does not currently use alcohol after a past usage of about 12.0 standard drinks of alcohol per week. Tobacco:  reports that he has been smoking cigarettes. He has a 17.00 pack-year smoking history. He has never used smokeless tobacco. Medical:  has a past medical history of Allergy, Calculus of kidney (02/04/2015), COPD (chronic obstructive pulmonary disease) (HCC), Diabetes mellitus without complication (HCC), Hypertension, and Neuropathy. Family: family history includes Alcohol abuse in his father; Cancer in his father; Cancer (age of onset: 69) in his brother; Cancer (age of onset: 28) in his mother; Diabetes in his brother; Heart disease in his brother and father.  Past Surgical History:  Procedure Laterality Date   APPENDECTOMY     INCISION AND DRAINAGE PERIRECTAL ABSCESS N/A 02/04/2015   Procedure: IRRIGATION AND DEBRIDEMENT PERIRECTAL ABSCESS;  Surgeon: Ida Rogue, MD;   Location: [facility];  Service: General;  Laterality: N/A;   ORIF ANKLE FRACTURE Left 03/23/2017   Procedure: OPEN REDUCTION INTERNAL FIXATION (ORIF) ANKLE FRACTURE;  Surgeon: Signa Kell, MD;  Location: [facility];  Service: Orthopedics;  Laterality: Left;   RECTAL EXAM UNDER ANESTHESIA  02/04/2015   Procedure: RECTAL EXAM UNDER ANESTHESIA;  Surgeon: Ida Rogue, MD;  Location: [facility];  Service: General;;   SYNDESMOSIS REPAIR Left 03/23/2017   Procedure: SYNDESMOSIS REPAIR;  Surgeon: Signa Kell, MD;  Location: [facility];  Service: Orthopedics;  Laterality: Left;   Active Ambulatory Problems    Diagnosis Date Noted   Chronic obstructive pulmonary disease (HCC) 01/17/2013   Type 2 diabetes mellitus (HCC) 02/04/2015   Acid reflux 01/17/2013   HLD (hyperlipidemia) 01/17/2013   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   Calculus of kidney 02/04/2015   Adiposity 07/06/2011   Apnea, sleep 01/17/2013   Testicular hypofunction 01/17/2013   Allergic state 01/17/2013   Perirectal abscess    Perianal abscess 02/04/2015   Ankle fracture 03/23/2017   Hypertension    Neuropathy 06/19/2021   Diabetic peripheral neuropathy (HCC) 12/30/2019   Type 2 diabetes mellitus with diabetic neuropathy, without long-term current use of insulin (HCC) 04/16/2016   Gout 05/28/2021   PVD (peripheral vascular disease) (HCC) 05/28/2021   Chronic ankle pain, bilateral 07/25/2020   Chronic low back pain (1ry area of Pain) (Bilateral) (R>L) w/o sciatica 07/25/2020   Other acquired hammer toe 11/28/2019   Chronic pain syndrome 09/08/2021   Pharmacologic therapy 09/08/2021   Disorder of skeletal system 09/08/2021   Problems influencing health status 09/08/2021   Lumbar facet syndrome 09/08/2021   Lumbosacral radiculopathy at S1 (Left) 09/08/2021   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 In no apparent acute distress Vitals:   09/08/21 1306  BP: (!) 136/91  Pulse: (!) 113  Resp: 18  Temp: 98.1 F (36.7 C)  TempSrc: Temporal  SpO2: 98%  Weight: 240 lb (108.9 kg)  Height: 6' (1.829 m)   BMI Assessment: Estimated body mass index is 32.55 kg/m as calculated from the following:   Height as of this encounter: 6' (1.829 m).   Weight as of this encounter: 240 lb (108.9 kg).  BMI interpretation table: BMI level Category Range association with higher incidence of chronic pain  <18 kg/m2 Underweight   18.5-24.9 kg/m2 Ideal body weight   25-29.9 kg/m2 Overweight Increased incidence by 20%  30-34.9 kg/m2 Obese (Class I) Increased incidence by 68%  35-39.9 kg/m2 Severe obesity (Class II) Increased incidence by 136%  >40 kg/m2 Extreme obesity (Class III) Increased incidence by 254%   Patient's current BMI Ideal Body weight  Body mass index is 32.55 kg/m. Ideal body weight: 77.6 kg (171 lb 1.2 oz) Adjusted ideal body weight: 90.1 kg (198 lb 10.3 oz)   BMI Readings from Last 4 Encounters:  09/08/21 32.55 kg/m  08/25/21 33.15 kg/m  08/15/21 32.88 kg/m  07/02/21 31.71 kg/m   Wt Readings from Last 4 Encounters:  09/08/21 240 lb (108.9 kg)  08/25/21 247 lb 6.4 oz (112.2 kg)  08/15/21 245 lb 6.4 oz (111.3 kg)  07/02/21 247 lb (112 kg)    Psych/Mental status: Alert, oriented x 3 (person, place, & time)       Eyes: PERLA Respiratory: No evidence of acute respiratory distress  Assessment  Primary Diagnosis & Pertinent  Problem List: The primary encounter diagnosis was Chronic pain syndrome. 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  Visit Diagnosis (New problems to examiner): 1. Chronic pain syndrome   2. Chronic low back pain (1ry area of Pain) (Bilateral) (R>L) w/o sciatica   3. Lumbar facet syndrome   4. Lumbosacral radiculopathy at S1 (Left)   5. 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 Chronic feet pain (2ry area of Pain) (Bilateral)   7. Chronic ankle pain (Left)   8. Abnormal NCS (nerve conduction studies) (02/20/2020)   9. Pharmacologic therapy   10. Disorder of skeletal system   11. Problems influencing health status    Plan of Care (Initial workup plan)  Note: [name] was reminded that as per protocol, today's visit has been an evaluation only. We have not taken over the patient's controlled substance management.  Problem-specific plan: No problem-specific Assessment & Plan notes found for this encounter.  Lab Orders         Compliance Drug Analysis, Ur         Magnesium         Vitamin B12         Sedimentation rate         25-Hydroxy vitamin D Lcms D2+D3         C-reactive protein     Imaging  Orders         DG Lumbar Spine Complete W/Bend     Referral Orders         Ambulatory referral to Physical Therapy     Procedure Orders    No procedure(s) ordered today   Pharmacotherapy (current): Medications ordered:  No orders of the defined types were placed in this encounter.  Medications administered  during this visit: Darren A. Darren Allen had no medications administered during this visit.   Pharmacological management options:  Opioid Analgesics: The patient was informed that there is no guarantee that he would be a candidate for opioid analgesics. The decision will be made following CDC guidelines. This decision will be based on the results of diagnostic studies, as well as [name] risk profile.   Membrane stabilizer: To be determined at a later time  Muscle relaxant: To be determined at a later time  NSAID: To be determined at a later time  Other analgesic(s): To be determined at a later time   Interventional management options: Mr. Virden was informed that there is no guarantee that he would be a candidate for interventional therapies. The decision will be based on the results of diagnostic studies, as well as Mr. Gul risk profile.  Procedure(s) under consideration:  Pending results of ordered studies      Interventional Therapies  Risk  Complexity Considerations:   Estimated body mass index is 32.55 kg/m as calculated from the following:   Height as of this encounter: 6' (1.829 m).   Weight as of this encounter: 240 lb (108.9 kg). WNL   Planned  Pending:   Pending further evaluation   Under consideration:   Diagnostic bilateral lumbar facet MBB #1  Diagnostic left L5-S1 LESI #1  Possible spinal cord stimulator trial    Completed:   (09/08/2021) referral to physical therapy for evaluation and treatment of low back pain.   Therapeutic  Palliative (PRN) options:   None established    Provider-requested follow-up: Return for ( ), Eval-day (M,W), (F2F), 2nd Visit, for review of ordered tests.  Future Appointments  Date Time Provider Department Center  09/12/2021  8:30 AM [facility] None  10/01/2021  9:40 AM Larae Grooms, NP [facility]  10/01/2021  2:00 PM Delano Metz, MD [facility] None  12/25/2021  2:00 PM Wyline Mood, MD [facility] None   12/30/2021  8:30 AM [facility] None  01/02/2022  8:30 AM McGowan, [place], PA-C [facility] None    Note by: Oswaldo Done, MD Date: 09/08/2021; Time: 3:29 PM

## 2021-09-08 ENCOUNTER — Other Ambulatory Visit: Payer: Self-pay

## 2021-09-08 ENCOUNTER — Encounter: Payer: Self-pay | Admitting: Pain Medicine

## 2021-09-08 ENCOUNTER — Ambulatory Visit
Admission: RE | Admit: 2021-09-08 | Discharge: 2021-09-08 | Disposition: A | Discharge status: Home / Self Care | Payer: HMO | Source: Ambulatory Visit | Attending: Pain Medicine | Admitting: Pain Medicine

## 2021-09-08 ENCOUNTER — Other Ambulatory Visit
Admission: RE | Admit: 2021-09-08 | Discharge: 2021-09-08 | Disposition: A | Discharge status: Home / Self Care | Payer: HMO | Source: Ambulatory Visit | Attending: Pain Medicine | Admitting: Pain Medicine

## 2021-09-08 ENCOUNTER — Ambulatory Visit (HOSPITAL_BASED_OUTPATIENT_CLINIC_OR_DEPARTMENT_OTHER): Payer: HMO | Admitting: Pain Medicine

## 2021-09-08 VITALS — BP 136/91 | HR 113 | Temp 98.1°F | Resp 18 | Ht 72.0 in | Wt 240.0 lb

## 2021-09-08 DIAGNOSIS — G894 Chronic pain syndrome: Secondary | ICD-10-CM | POA: Insufficient documentation

## 2021-09-08 DIAGNOSIS — M47896 Other spondylosis, lumbar region: Secondary | ICD-10-CM | POA: Insufficient documentation

## 2021-09-08 DIAGNOSIS — G4730 Sleep apnea, unspecified: Secondary | ICD-10-CM | POA: Diagnosis not present

## 2021-09-08 DIAGNOSIS — M2040 Other hammer toe(s) (acquired), unspecified foot: Secondary | ICD-10-CM | POA: Insufficient documentation

## 2021-09-08 DIAGNOSIS — E1142 Type 2 diabetes mellitus with diabetic polyneuropathy: Secondary | ICD-10-CM | POA: Insufficient documentation

## 2021-09-08 DIAGNOSIS — M899 Disorder of bone, unspecified: Secondary | ICD-10-CM | POA: Insufficient documentation

## 2021-09-08 DIAGNOSIS — M79671 Pain in right foot: Secondary | ICD-10-CM

## 2021-09-08 DIAGNOSIS — M47816 Spondylosis without myelopathy or radiculopathy, lumbar region: Secondary | ICD-10-CM | POA: Insufficient documentation

## 2021-09-08 DIAGNOSIS — M5417 Radiculopathy, lumbosacral region: Secondary | ICD-10-CM | POA: Insufficient documentation

## 2021-09-08 DIAGNOSIS — K611 Rectal abscess: Secondary | ICD-10-CM | POA: Insufficient documentation

## 2021-09-08 DIAGNOSIS — M25571 Pain in right ankle and joints of right foot: Secondary | ICD-10-CM | POA: Insufficient documentation

## 2021-09-08 DIAGNOSIS — R94130 Abnormal response to nerve stimulation, unspecified: Secondary | ICD-10-CM | POA: Insufficient documentation

## 2021-09-08 DIAGNOSIS — Z7982 Long term (current) use of aspirin: Secondary | ICD-10-CM | POA: Insufficient documentation

## 2021-09-08 DIAGNOSIS — Z9109 Other allergy status, other than to drugs and biological substances: Secondary | ICD-10-CM | POA: Insufficient documentation

## 2021-09-08 DIAGNOSIS — E1151 Type 2 diabetes mellitus with diabetic peripheral angiopathy without gangrene: Secondary | ICD-10-CM | POA: Insufficient documentation

## 2021-09-08 DIAGNOSIS — Z794 Long term (current) use of insulin: Secondary | ICD-10-CM | POA: Insufficient documentation

## 2021-09-08 DIAGNOSIS — M545 Low back pain, unspecified: Secondary | ICD-10-CM | POA: Insufficient documentation

## 2021-09-08 DIAGNOSIS — Z811 Family history of alcohol abuse and dependence: Secondary | ICD-10-CM | POA: Insufficient documentation

## 2021-09-08 DIAGNOSIS — G8929 Other chronic pain: Secondary | ICD-10-CM

## 2021-09-08 DIAGNOSIS — Z79899 Other long term (current) drug therapy: Secondary | ICD-10-CM | POA: Insufficient documentation

## 2021-09-08 DIAGNOSIS — E669 Obesity, unspecified: Secondary | ICD-10-CM | POA: Diagnosis not present

## 2021-09-08 DIAGNOSIS — Z634 Disappearance and death of family member: Secondary | ICD-10-CM | POA: Insufficient documentation

## 2021-09-08 DIAGNOSIS — J439 Emphysema, unspecified: Secondary | ICD-10-CM | POA: Insufficient documentation

## 2021-09-08 DIAGNOSIS — Z833 Family history of diabetes mellitus: Secondary | ICD-10-CM | POA: Insufficient documentation

## 2021-09-08 DIAGNOSIS — Z789 Other specified health status: Secondary | ICD-10-CM | POA: Insufficient documentation

## 2021-09-08 DIAGNOSIS — Z8719 Personal history of other diseases of the digestive system: Secondary | ICD-10-CM | POA: Insufficient documentation

## 2021-09-08 DIAGNOSIS — K219 Gastro-esophageal reflux disease without esophagitis: Secondary | ICD-10-CM | POA: Diagnosis not present

## 2021-09-08 DIAGNOSIS — M25572 Pain in left ankle and joints of left foot: Secondary | ICD-10-CM | POA: Insufficient documentation

## 2021-09-08 DIAGNOSIS — I10 Essential (primary) hypertension: Secondary | ICD-10-CM | POA: Diagnosis not present

## 2021-09-08 DIAGNOSIS — Z7985 Long-term (current) use of injectable non-insulin antidiabetic drugs: Secondary | ICD-10-CM | POA: Insufficient documentation

## 2021-09-08 DIAGNOSIS — W19XXXA Unspecified fall, initial encounter: Secondary | ICD-10-CM | POA: Insufficient documentation

## 2021-09-08 DIAGNOSIS — Z87442 Personal history of urinary calculi: Secondary | ICD-10-CM | POA: Diagnosis not present

## 2021-09-08 DIAGNOSIS — M79672 Pain in left foot: Secondary | ICD-10-CM | POA: Insufficient documentation

## 2021-09-08 DIAGNOSIS — Z8781 Personal history of (healed) traumatic fracture: Secondary | ICD-10-CM | POA: Insufficient documentation

## 2021-09-08 DIAGNOSIS — F32A Depression, unspecified: Secondary | ICD-10-CM | POA: Insufficient documentation

## 2021-09-08 DIAGNOSIS — M542 Cervicalgia: Secondary | ICD-10-CM | POA: Insufficient documentation

## 2021-09-08 DIAGNOSIS — Z6832 Body mass index (BMI) 32.0-32.9, adult: Secondary | ICD-10-CM | POA: Insufficient documentation

## 2021-09-08 DIAGNOSIS — K610 Anal abscess: Secondary | ICD-10-CM | POA: Diagnosis not present

## 2021-09-08 DIAGNOSIS — F17210 Nicotine dependence, cigarettes, uncomplicated: Secondary | ICD-10-CM | POA: Diagnosis not present

## 2021-09-08 DIAGNOSIS — R12 Heartburn: Secondary | ICD-10-CM | POA: Insufficient documentation

## 2021-09-08 DIAGNOSIS — Z7984 Long term (current) use of oral hypoglycemic drugs: Secondary | ICD-10-CM | POA: Insufficient documentation

## 2021-09-08 DIAGNOSIS — Z809 Family history of malignant neoplasm, unspecified: Secondary | ICD-10-CM | POA: Insufficient documentation

## 2021-09-08 DIAGNOSIS — E291 Testicular hypofunction: Secondary | ICD-10-CM | POA: Insufficient documentation

## 2021-09-08 DIAGNOSIS — Z8249 Family history of ischemic heart disease and other diseases of the circulatory system: Secondary | ICD-10-CM | POA: Insufficient documentation

## 2021-09-08 LAB — SEDIMENTATION RATE: Sed Rate: 17 mm/hr (ref 0–20)

## 2021-09-08 LAB — VITAMIN B12: Vitamin B-12: 391 pg/mL (ref 180–914)

## 2021-09-08 LAB — MAGNESIUM: Magnesium: 1.7 mg/dL (ref 1.7–2.4)

## 2021-09-08 LAB — C-REACTIVE PROTEIN: CRP: 1.8 mg/dL — ABNORMAL HIGH (ref <1.0)

## 2021-09-10 ENCOUNTER — Other Ambulatory Visit: Payer: Self-pay

## 2021-09-10 DIAGNOSIS — E349 Endocrine disorder, unspecified: Secondary | ICD-10-CM

## 2021-09-11 LAB — URINE DRUGS OF ABUSE SCREEN W ALC, ROUTINE (REF LAB)
Amphetamines, Urine: NEGATIVE ng/mL
Barbiturate, Ur: NEGATIVE ng/mL
Benzodiazepine Quant, Ur: NEGATIVE ng/mL
Cannabinoid Quant, Ur: NEGATIVE ng/mL
Cocaine (Metab.): NEGATIVE ng/mL
Ethanol U, Quan: NEGATIVE %
Methadone Screen, Urine: NEGATIVE ng/mL
Phencyclidine, Ur: NEGATIVE ng/mL
Propoxyphene, Urine: NEGATIVE ng/mL

## 2021-09-11 LAB — OPIATES CONFIRMATION, URINE: OPIATES: NEGATIVE

## 2021-09-12 ENCOUNTER — Other Ambulatory Visit: Payer: PPO

## 2021-09-12 DIAGNOSIS — E349 Endocrine disorder, unspecified: Secondary | ICD-10-CM

## 2021-09-12 LAB — 25-HYDROXY VITAMIN D LCMS D2+D3
25-Hydroxy, Vitamin D-2: <1 ng/mL
25-Hydroxy, Vitamin D-3: 43 ng/mL
25-Hydroxy, Vitamin D: 44 ng/mL

## 2021-09-12 LAB — COMPLIANCE DRUG ANALYSIS, UR

## 2021-09-13 LAB — TESTOSTERONE: Testosterone: 757 ng/dL (ref 264–916)

## 2021-09-15 ENCOUNTER — Telehealth: Payer: Self-pay

## 2021-09-15 DIAGNOSIS — E291 Testicular hypofunction: Secondary | ICD-10-CM

## 2021-09-15 NOTE — Telephone Encounter (Signed)
Left detailed message notifying him of results per DPR, requested he call back to make apt in 3 mo. Labs ordered, my chart notification also sent

## 2021-09-15 NOTE — Telephone Encounter (Signed)
-----   Message from Nori Riis, PA-C sent at 09/14/2021  4:13 PM EDT ----- Please let [name] know that the testosterone level is now therapeutic.  Please continue with present dose of injection.  We need to recheck his levels again in three months with PSA, testosterone midway through the injections and H & H

## 2021-09-18 ENCOUNTER — Encounter: Payer: Self-pay | Admitting: Pain Medicine

## 2021-09-18 DIAGNOSIS — R7982 Elevated C-reactive protein (CRP): Secondary | ICD-10-CM | POA: Insufficient documentation

## 2021-10-01 ENCOUNTER — Encounter: Payer: PPO | Admitting: Nurse Practitioner

## 2021-10-01 ENCOUNTER — Ambulatory Visit: Payer: HMO | Admitting: Pain Medicine

## 2021-10-09 ENCOUNTER — Encounter: Payer: Self-pay | Admitting: Nurse Practitioner

## 2021-10-10 MED ORDER — HYDROCODONE-ACETAMINOPHEN 5-325 MG PO TABS: 1.0000 | ORAL_TABLET | Freq: Two times a day (BID) | ORAL | 0 refills | Status: DC | PRN

## 2021-10-20 ENCOUNTER — Ambulatory Visit: Payer: Self-pay | Admitting: Urology

## 2021-10-22 NOTE — Progress Notes (Unsigned)
There were no vitals taken for this visit.   Subjective:    Patient ID: Darren Allen, male    DOB: October 21, 1965, 56 y.o.   MRN: [id number]  HPI: Darren Allen is a 56 y.o. male presenting on 10/23/2021 for comprehensive medical examination. Current medical complaints include:{Blank single:19197::"none","***"}  He currently lives with: Interim Problems from his last visit: {Blank single:19197::"yes","no"}  HYPERTENSION / HYPERLIPIDEMIA Satisfied with current treatment? {Blank single:19197::"yes","no"} Duration of hypertension: {Blank single:19197::"chronic","months","years"} BP monitoring frequency: {Blank single:19197::"not checking","rarely","daily","weekly","monthly","a few times a day","a few times a week","a few times a month"} BP range:  BP medication side effects: {Blank single:19197::"yes","no"} Past BP meds: {Blank IOEVOJJK:09381::"WEXH","BZJIRCVELF","YBOFBPZWCH/ENIDPOEUMP","NTIRWERX","VQMGQQPYPP","JKDTOIZTIW/PYKD","XIPJASNKNL (bystolic)","carvedilol","chlorthalidone","clonidine","diltiazem","exforge HCT","HCTZ","irbesartan (avapro)","labetalol","lisinopril","lisinopril-HCTZ","losartan (cozaar)","methyldopa","nifedipine","olmesartan (benicar)","olmesartan-HCTZ","quinapril","ramipril","spironalactone","tekturna","valsartan","valsartan-HCTZ","verapamil"} Duration of hyperlipidemia: {Blank single:19197::"chronic","months","years"} Cholesterol medication side effects: {Blank single:19197::"yes","no"} Cholesterol supplements: {Blank multiple:19196::"none","fish oil","niacin","red yeast rice"} Past cholesterol medications: {Blank multiple:19196::"none","atorvastain (lipitor)","lovastatin (mevacor)","pravastatin (pravachol)","rosuvastatin (crestor)","simvastatin (zocor)","vytorin","fenofibrate (tricor)","gemfibrozil","ezetimide (zetia)","niaspan","lovaza"} Medication compliance: {Blank single:19197::"excellent compliance","good compliance","fair compliance","poor compliance"} Aspirin:  {Blank single:19197::"yes","no"} Recent stressors: {Blank single:19197::"yes","no"} Recurrent headaches: {Blank single:19197::"yes","no"} Visual changes: {Blank single:19197::"yes","no"} Palpitations: {Blank single:19197::"yes","no"} Dyspnea: {Blank single:19197::"yes","no"} Chest pain: {Blank single:19197::"yes","no"} Lower extremity edema: {Blank single:19197::"yes","no"} Dizzy/lightheaded: {Blank single:19197::"yes","no"}  DIABETES Hypoglycemic episodes:{Blank single:19197::"yes","no"} Polydipsia/polyuria: {Blank single:19197::"yes","no"} Visual disturbance: {Blank single:19197::"yes","no"} Chest pain: {Blank single:19197::"yes","no"} Paresthesias: {Blank single:19197::"yes","no"} Glucose Monitoring: {Blank single:19197::"yes","no"}  Accucheck frequency: {Blank single:19197::"Not Checking","Daily","BID","TID"}  Fasting glucose:  Post prandial:  Evening:  Before meals: Taking Insulin?: {Blank single:19197::"yes","no"}  Long acting insulin:  Short acting insulin: Blood Pressure Monitoring: {Blank single:19197::"not checking","rarely","daily","weekly","monthly","a few times a day","a few times a week","a few times a month"} Retinal Examination: {Blank single:19197::"Up to Date","Not up to Date"} Foot Exam: {Blank single:19197::"Up to Date","Not up to Date"} Diabetic Education: {Blank single:19197::"Completed","Not Completed"} Pneumovax: {Blank single:19197::"Up to Date","Not up to Date","unknown"} Influenza: {Blank single:19197::"Up to Date","Not up to Date","unknown"} Aspirin: {Blank single:19197::"yes","no"}  COPD COPD status: {Blank single:19197::"controlled","uncontrolled","better","worse","exacerbated","stable"} Satisfied with current treatment?: {Blank single:19197::"yes","no"} Oxygen use: {Blank single:19197::"yes","no"} Dyspnea frequency:  Cough frequency:  Rescue inhaler frequency:   Limitation of activity: {Blank single:19197::"yes","no"} Productive cough:  Last  Spirometry:  Pneumovax: {Blank single:19197::"Up to Date","Not up to Date","unknown"} Influenza: {Blank single:19197::"Up to Date","Not up to Date","unknown"}  Depression Screen done today and results listed below:     09/08/2021    1:14 PM 08/25/2021    4:00 PM 08/15/2021    1:31 PM  Depression screen PHQ 2/9  Decreased Interest 0 1 0  Down, Depressed, Hopeless 0 0 0  PHQ - 2 Score 0 1 0  Altered sleeping  1 1  Tired, decreased energy  1 1  Change in appetite  0 0  Feeling bad or failure about yourself   0 0  Trouble concentrating  0 0  Moving slowly or fidgety/restless  0 0  Suicidal thoughts  0 0  PHQ-9 Score  3 2  Difficult doing work/chores  Not difficult at all Not difficult at all    The patient {has/does not ZJQB:34193} a history of falls. I {did/did not:19850} complete a risk assessment for falls. A plan of care for falls {was/was not:19852} documented.   Past Medical History:  Past Medical History:  Diagnosis Date   Allergy    Calculus of kidney 02/04/2015   COPD (chronic obstructive pulmonary disease) (HCC)    Diabetes mellitus without complication ([place])    type 2   Hypertension    Neuropathy     Surgical History:  Past Surgical History:  Procedure Laterality Date   APPENDECTOMY     INCISION AND DRAINAGE PERIRECTAL ABSCESS N/A 02/04/2015   Procedure: IRRIGATION AND DEBRIDEMENT PERIRECTAL  ABSCESS;  Surgeon: Marlyce Huge, MD;  Location: [facility];  Service: General;  Laterality: N/A;   ORIF ANKLE FRACTURE Left 03/23/2017   Procedure: OPEN REDUCTION INTERNAL FIXATION (ORIF) ANKLE FRACTURE;  Surgeon: Leim Fabry, MD;  Location: [facility];  Service: Orthopedics;  Laterality: Left;   RECTAL EXAM UNDER ANESTHESIA  02/04/2015   Procedure: RECTAL EXAM UNDER ANESTHESIA;  Surgeon: Marlyce Huge, MD;  Location: [facility];  Service: General;;   SYNDESMOSIS REPAIR Left 03/23/2017   Procedure: SYNDESMOSIS REPAIR;  Surgeon: Leim Fabry, MD;  Location: [facility];   Service: Orthopedics;  Laterality: Left;    Medications:  Current Outpatient Medications on File Prior to Visit  Medication Sig   Accu-Chek Softclix Lancets lancets 3 (three) times daily.   acetaminophen (TYLENOL) 500 MG tablet Take 2 tablets (1,000 mg total) by mouth every 8 (eight) hours.   amitriptyline (ELAVIL) 10 MG tablet Take 30 mg by mouth at bedtime.   ascorbic acid (VITAMIN C) 500 MG tablet Take by mouth daily.   BD DISP NEEDLES 22G X 1-1/2" MISC every 14 (fourteen) days.   ferrous sulfate 325 (65 FE) MG tablet Take 325 mg by mouth daily with breakfast.   fluticasone (FLONASE) 50 MCG/ACT nasal spray Place into both nostrils.   gabapentin (NEURONTIN) 600 MG tablet Take 600 mg by mouth 3 (three) times daily.   hydrochlorothiazide (HYDRODIURIL) 25 MG tablet Take 25 mg by mouth daily.   HYDROcodone-acetaminophen (NORCO/VICODIN) 5-325 MG tablet Take 1 tablet by mouth 2 (two) times daily as needed.   insulin isophane & regular human (HUMULIN 70/30 KWIKPEN) (70-30) 100 UNIT/ML KwikPen Inject 42-48 Units into the skin 2 (two) times daily with a meal. 48 units every morning and 42 units every evening   lisinopril (PRINIVIL,ZESTRIL) 40 MG tablet Take 40 mg by mouth daily.   metFORMIN (GLUCOPHAGE-XR) 500 MG 24 hr tablet Take 4 tablets by mouth daily.   metoprolol tartrate (LOPRESSOR) 100 MG tablet Take 1 tablet (100 mg total) by mouth once for 1 dose. Please take one time dose '100mg'$  metoprolol tartrate 2 hr prior to cardiac CT for HR control IF HR >55bpm.   montelukast (SINGULAIR) 10 MG tablet Take 10 mg by mouth daily.   NEEDLE, DISP, 18 G (B-D BLUNT FILL NEEDLE) 18G X 1-1/2" MISC Use 18G needle to draw testosterone for injection   pantoprazole (PROTONIX) 40 MG tablet Take 80 mg by mouth daily.   PROAIR HFA 108 (90 Base) MCG/ACT inhaler Inhale 2 puffs into the lungs every 6 (six) hours as needed.   rosuvastatin (CRESTOR) 40 MG tablet Take 40 mg by mouth daily.   SYRINGE-NEEDLE, DISP, 3 ML  (BD ECLIPSE SYRINGE) 21G X 1" 3 ML MISC Use to administer testosterone   testosterone cypionate (DEPOTESTOSTERONE CYPIONATE) 200 MG/ML injection Inject 1 mL (200 mg total) into the muscle every 14 (fourteen) days.   TRELEGY ELLIPTA 200-62.5-25 MCG/INH AEPB Inhale 1 puff into the lungs daily.   triamcinolone cream (KENALOG) 0.1 % Apply 1 application  topically 2 (two) times daily.   TRULICITY 1.5 VO/1.6WV SOPN Inject 1.5 mg into the skin once a week.   No current facility-administered medications on file prior to visit.    Allergies:  Allergies  Allergen Reactions   Glipizide Other (See Comments) and Palpitations    Shaky, feel bad Other reaction(s): Dizziness   Cephalexin Rash   Duloxetine Anxiety and Nausea Only    Social History:  Social History   Socioeconomic History   Marital status:  Single    Spouse name: Not on file   Number of children: Not on file   Years of education: Not on file   Highest education level: Not on file  Occupational History   Not on file  Tobacco Use   Smoking status: Every Day    Packs/day: 0.50    Years: 34.00    Total pack years: 17.00    Types: Cigarettes   Smokeless tobacco: Never   Tobacco comments:    patient using Nicoderm patches  Vaping Use   Vaping Use: Never used  Substance and Sexual Activity   Alcohol use: Not Currently    Alcohol/week: 12.0 standard drinks of alcohol    Types: 12 Cans of beer per week    Comment: Quit February 2023   Drug use: No   Sexual activity: Yes  Other Topics Concern   Not on file  Social History Narrative   Not on file   Social Determinants of Health   Financial Resource Strain: Not on file  Food Insecurity: Not on file  Transportation Needs: Not on file  Physical Activity: Not on file  Stress: Not on file  Social Connections: Not on file  Intimate Partner Violence: Not on file   Social History   Tobacco Use  Smoking Status Every Day   Packs/day: 0.50   Years: 34.00   Total pack  years: 17.00   Types: Cigarettes  Smokeless Tobacco Never  Tobacco Comments   patient using Nicoderm patches   Social History   Substance and Sexual Activity  Alcohol Use Not Currently   Alcohol/week: 12.0 standard drinks of alcohol   Types: 12 Cans of beer per week   Comment: Quit February 2023    Family History:  Family History  Problem Relation Age of Onset   Cancer Mother 89       Lung   Cancer Father        Colon   Heart disease Father    Alcohol abuse Father    Cancer Brother 56       Esophageal   Diabetes Brother    Heart disease Brother     Past medical history, surgical history, medications, allergies, family history and social history reviewed with patient today and changes made to appropriate areas of the chart.   ROS All other ROS negative except what is listed above and in the HPI.      Objective:    There were no vitals taken for this visit.  Wt Readings from Last 3 Encounters:  09/08/21 240 lb (108.9 kg)  08/25/21 247 lb 6.4 oz (112.2 kg)  08/15/21 245 lb 6.4 oz (111.3 kg)    Physical Exam  Results for orders placed or performed in visit on 09/12/21  Testosterone  Result Value Ref Range   Testosterone 757 264 - 916 ng/dL      Assessment & Plan:   Problem List Items Addressed This Visit       Cardiovascular and Mediastinum   Hypertension   PVD (peripheral vascular disease) ([place]) - Primary     Respiratory   Chronic obstructive pulmonary disease ([place])     Endocrine   Type 2 diabetes mellitus with diabetic neuropathy, without long-term current use of insulin (HCC)     Other   Chronic pain syndrome (Chronic)   HLD (hyperlipidemia)   Other Visit Diagnoses     Annual physical exam            Discussed aspirin prophylaxis  for myocardial infarction prevention and decision was {Blank single:19197::"it was not indicated","made to continue ASA","made to start ASA","made to stop ASA","that we recommended ASA, and patient  refused"}  LABORATORY TESTING:  Health maintenance labs ordered today as discussed above.   The natural history of prostate cancer and ongoing controversy regarding screening and potential treatment outcomes of prostate cancer has been discussed with the patient. The meaning of a false positive PSA and a false negative PSA has been discussed. He indicates understanding of the limitations of this screening test and wishes *** to proceed with screening PSA testing.   IMMUNIZATIONS:   - Tdap: Tetanus vaccination status reviewed: {tetanus status:315746}. - Influenza: {Blank single:19197::"Up to date","Administered today","Postponed to flu season","Refused","Given elsewhere"} - Pneumovax: {Blank single:19197::"Up to date","Administered today","Not applicable","Refused","Given elsewhere"} - Prevnar: {Blank single:19197::"Up to date","Administered today","Not applicable","Refused","Given elsewhere"} - COVID: {Blank single:19197::"Up to date","Administered today","Not applicable","Refused","Given elsewhere"} - HPV: {Blank single:19197::"Up to date","Administered today","Not applicable","Refused","Given elsewhere"} - Shingrix vaccine: {Blank single:19197::"Up to date","Administered today","Not applicable","Refused","Given elsewhere"}  SCREENING: - Colonoscopy: {Blank single:19197::"Up to date","Ordered today","Not applicable","Refused","Done elsewhere"}  Discussed with patient purpose of the colonoscopy is to detect colon cancer at curable precancerous or early stages   - AAA Screening: {Blank single:19197::"Up to date","Ordered today","Not applicable","Refused","Done elsewhere"}  -Hearing Test: {Blank single:19197::"Up to date","Ordered today","Not applicable","Refused","Done elsewhere"}  -Spirometry: {Blank single:19197::"Up to date","Ordered today","Not applicable","Refused","Done elsewhere"}   PATIENT COUNSELING:    Sexuality: Discussed sexually transmitted diseases, partner selection, use of  condoms, avoidance of unintended pregnancy  and contraceptive alternatives.   Advised to avoid cigarette smoking.  I discussed with the patient that most people either abstain from alcohol or drink within safe limits (<=14/week and <=4 drinks/occasion for males, <=7/weeks and <= 3 drinks/occasion for females) and that the risk for alcohol disorders and other health effects rises proportionally with the number of drinks per week and how often a drinker exceeds daily limits.  Discussed cessation/primary prevention of drug use and availability of treatment for abuse.   Diet: Encouraged to adjust caloric intake to maintain  or achieve ideal body weight, to reduce intake of dietary saturated fat and total fat, to limit sodium intake by avoiding high sodium foods and not adding table salt, and to maintain adequate dietary potassium and calcium preferably from fresh fruits, vegetables, and low-fat dairy products.    stressed the importance of regular exercise  Injury prevention: Discussed safety belts, safety helmets, smoke detector, smoking near bedding or upholstery.   Dental health: Discussed importance of regular tooth brushing, flossing, and dental visits.   Follow up plan: NEXT PREVENTATIVE PHYSICAL DUE IN 1 YEAR. No follow-ups on file.

## 2021-10-23 ENCOUNTER — Ambulatory Visit (INDEPENDENT_AMBULATORY_CARE_PROVIDER_SITE_OTHER): Payer: HMO | Admitting: Nurse Practitioner

## 2021-10-23 ENCOUNTER — Encounter: Payer: Self-pay | Admitting: Nurse Practitioner

## 2021-10-23 VITALS — BP 128/82 | HR 87 | Temp 98.5°F | Ht 73.23 in | Wt 250.0 lb

## 2021-10-23 DIAGNOSIS — I739 Peripheral vascular disease, unspecified: Secondary | ICD-10-CM

## 2021-10-23 DIAGNOSIS — Z0000 Encounter for general adult medical examination without abnormal findings: Secondary | ICD-10-CM

## 2021-10-23 DIAGNOSIS — Z1211 Encounter for screening for malignant neoplasm of colon: Secondary | ICD-10-CM

## 2021-10-23 DIAGNOSIS — J449 Chronic obstructive pulmonary disease, unspecified: Secondary | ICD-10-CM

## 2021-10-23 DIAGNOSIS — G894 Chronic pain syndrome: Secondary | ICD-10-CM

## 2021-10-23 DIAGNOSIS — E1140 Type 2 diabetes mellitus with diabetic neuropathy, unspecified: Secondary | ICD-10-CM | POA: Diagnosis not present

## 2021-10-23 DIAGNOSIS — E782 Mixed hyperlipidemia: Secondary | ICD-10-CM

## 2021-10-23 DIAGNOSIS — I10 Essential (primary) hypertension: Secondary | ICD-10-CM

## 2021-10-23 DIAGNOSIS — Z1159 Encounter for screening for other viral diseases: Secondary | ICD-10-CM

## 2021-10-23 DIAGNOSIS — H6123 Impacted cerumen, bilateral: Secondary | ICD-10-CM

## 2021-10-23 LAB — MICROSCOPIC EXAMINATION
Epithelial Cells (non renal): NONE SEEN /hpf (ref 0–10)
RBC, Urine: NONE SEEN /hpf (ref 0–2)

## 2021-10-23 LAB — URINALYSIS, ROUTINE W REFLEX MICROSCOPIC
Bilirubin, UA: NEGATIVE
Glucose, UA: NEGATIVE
Nitrite, UA: NEGATIVE
RBC, UA: NEGATIVE
Specific Gravity, UA: 1.025 (ref 1.005–1.030)
Urobilinogen, Ur: 0.2 mg/dL (ref 0.2–1.0)
pH, UA: 6 (ref 5.0–7.5)

## 2021-10-23 NOTE — Assessment & Plan Note (Signed)
Chronic.  Controlled.  Continue with current medication regimen.  Labs ordered today.  Return to clinic in 6 months for reevaluation.  Call sooner if concerns arise.  ? ?

## 2021-10-23 NOTE — Assessment & Plan Note (Signed)
Chronic.  Controlled.  Continue with current medication regimen of Metoprolol, Lisinopril, and HCTZ.  Will draw labs at next visit.  Return to clinic in 6 months for reevaluation.  Call sooner if concerns arise.

## 2021-10-23 NOTE — Assessment & Plan Note (Signed)
Chronic. Not well controlled.  Wheezing in all lung fields on exam.  Currently on Trellegy and using Albuterol 3-4x daily.  Has appt with Pulmonology today.  Continue to follow their recommendations. Follow up in 6 months.  Call sooner if concerns arise.

## 2021-10-23 NOTE — Assessment & Plan Note (Signed)
Chronic. Not well controlled.  See's Endocrinology.  Currently on Trulicity, Metformin and long acting insulin.  Recently got continuous glucose monitor.  Sugars are running in the 200s.  Continue to follow their recommendations.  A1c ordered today.  Follow up in 6 months. Call sooner if concerns arise.

## 2021-10-23 NOTE — Assessment & Plan Note (Signed)
Chronic. Has been seeing pain management for several years.  Had to switch due to insurance.  Short supply of pain medications sent previously to get him to his appt with new pain management second appt on 10/29/21.  Continue to follow up with pain management.

## 2021-10-24 ENCOUNTER — Other Ambulatory Visit: Payer: Self-pay | Admitting: Nurse Practitioner

## 2021-10-24 ENCOUNTER — Encounter: Payer: Self-pay | Admitting: Nurse Practitioner

## 2021-10-24 LAB — CBC WITH DIFFERENTIAL/PLATELET
Basophils Absolute: 0 10*3/uL (ref 0.0–0.2)
Basos: 1 %
EOS (ABSOLUTE): 0.4 10*3/uL (ref 0.0–0.4)
Eos: 7 %
Hematocrit: 40 % (ref 37.5–51.0)
Hemoglobin: 13.2 g/dL (ref 13.0–17.7)
Immature Grans (Abs): 0 10*3/uL (ref 0.0–0.1)
Immature Granulocytes: 0 %
Lymphocytes Absolute: 1.2 10*3/uL (ref 0.7–3.1)
Lymphs: 19 %
MCH: 28.4 pg (ref 26.6–33.0)
MCHC: 33 g/dL (ref 31.5–35.7)
MCV: 86 fL (ref 79–97)
Monocytes Absolute: 0.7 10*3/uL (ref 0.1–0.9)
Monocytes: 11 %
Neutrophils Absolute: 3.9 10*3/uL (ref 1.4–7.0)
Neutrophils: 62 %
Platelets: 216 10*3/uL (ref 150–450)
RBC: 4.64 x10E6/uL (ref 4.14–5.80)
RDW: 13.7 % (ref 11.6–15.4)
WBC: 6.2 10*3/uL (ref 3.4–10.8)

## 2021-10-24 LAB — COMPREHENSIVE METABOLIC PANEL
ALT: 29 IU/L (ref 0–44)
AST: 25 IU/L (ref 0–40)
Albumin/Globulin Ratio: 1.8 (ref 1.2–2.2)
Albumin: 4.4 g/dL (ref 3.8–4.9)
Alkaline Phosphatase: 70 IU/L (ref 44–121)
BUN/Creatinine Ratio: 11 (ref 9–20)
BUN: 13 mg/dL (ref 6–24)
Bilirubin Total: 0.3 mg/dL (ref 0.0–1.2)
CO2: 23 mmol/L (ref 20–29)
Calcium: 9.9 mg/dL (ref 8.7–10.2)
Chloride: 100 mmol/L (ref 96–106)
Creatinine, Ser: 1.22 mg/dL (ref 0.76–1.27)
Globulin, Total: 2.5 g/dL (ref 1.5–4.5)
Glucose: 172 mg/dL — ABNORMAL HIGH (ref 70–99)
Potassium: 4.3 mmol/L (ref 3.5–5.2)
Sodium: 138 mmol/L (ref 134–144)
Total Protein: 6.9 g/dL (ref 6.0–8.5)
eGFR: 69 mL/min/{1.73_m2} (ref >59)

## 2021-10-24 LAB — TSH: TSH: 2.04 u[IU]/mL (ref 0.450–4.500)

## 2021-10-24 LAB — HEMOGLOBIN A1C
Est. average glucose Bld gHb Est-mCnc: 177 mg/dL
Hgb A1c MFr Bld: 7.8 % — ABNORMAL HIGH (ref 4.8–5.6)

## 2021-10-24 LAB — LIPID PANEL
Chol/HDL Ratio: 5.6 ratio — ABNORMAL HIGH (ref 0.0–5.0)
Cholesterol, Total: 141 mg/dL (ref 100–199)
HDL: 25 mg/dL — ABNORMAL LOW (ref >39)
LDL Chol Calc (NIH): 42 mg/dL (ref 0–99)
Triglycerides: 510 mg/dL — ABNORMAL HIGH (ref 0–149)
VLDL Cholesterol Cal: 74 mg/dL — ABNORMAL HIGH (ref 5–40)

## 2021-10-24 LAB — PSA: Prostate Specific Ag, Serum: 2.3 ng/mL (ref 0.0–4.0)

## 2021-10-24 LAB — HEPATITIS C ANTIBODY: Hep C Virus Ab: NONREACTIVE

## 2021-10-24 MED ORDER — METFORMIN HCL ER 500 MG PO TB24: 2000.0000 mg | ORAL_TABLET | Freq: Every day | ORAL | 1 refills | Status: DC

## 2021-10-24 NOTE — Telephone Encounter (Signed)
Refilled 10/24/2021 #90 1 refill- confirmed by pharmacy. Requested Prescriptions  Pending Prescriptions Disp Refills  . metFORMIN (GLUCOPHAGE-XR) 500 MG 24 hr tablet [Pharmacy Med Name: METFORMIN ER $RemoveBefo'500MG'[id number]$  24HR TABS] 368 tablet     Sig: TAKE 4 TABLETS(2000 MG) BY MOUTH DAILY     Endocrinology:  Diabetes - Biguanides Passed - 10/24/2021 10:18 AM      Passed - Cr in normal range and within 360 days    Creatinine  Date Value Ref Range Status  03/13/2014 1.08 0.60 - 1.30 mg/dL Final   Creatinine, Ser  Date Value Ref Range Status  10/23/2021 1.22 0.76 - 1.27 mg/dL Final         Passed - HBA1C is between 0 and 7.9 and within 180 days    Hgb A1c MFr Bld  Date Value Ref Range Status  10/23/2021 7.8 (H) 4.8 - 5.6 % Final    Comment:             Prediabetes: 5.7 - 6.4          Diabetes: >6.4          Glycemic control for adults with diabetes: <7.0          Passed - eGFR in normal range and within 360 days    EGFR (African American)  Date Value Ref Range Status  03/13/2014 >60 >66mL/min Final  03/15/2012 >60  Final   GFR calc Af Amer  Date Value Ref Range Status  12/07/2019 59 (L) >60 mL/min Final   EGFR (Non-African Amer.)  Date Value Ref Range Status  03/13/2014 >60 >60mL/min Final    Comment:    eGFR values <37mL/min/1.73 m2 may be an indication of chronic kidney disease (CKD). Calculated eGFR, using the MRDR Study equation, is useful in  patients with stable renal function. The eGFR calculation will not be reliable in acutely ill patients when serum creatinine is changing rapidly. It is not useful in patients on dialysis. The eGFR calculation may not be applicable to patients at the low and high extremes of body sizes, pregnant women, and vegetarians.   03/15/2012 >60  Final    Comment:    eGFR values <63mL/min/1.73 m2 may be an indication of chronic kidney disease (CKD). Calculated eGFR is useful in patients with stable renal function. The eGFR calculation will not be  reliable in acutely ill patients when serum creatinine is changing rapidly. It is not useful in  patients on dialysis. The eGFR calculation may not be applicable to patients at the low and high extremes of body sizes, pregnant women, and vegetarians.    GFR, Estimated  Date Value Ref Range Status  05/30/2021 >60 >60 mL/min Final    Comment:    (NOTE) Calculated using the CKD-EPI Creatinine Equation (2021)    eGFR  Date Value Ref Range Status  10/23/2021 69 >59 mL/min/1.73 Final         Passed - B12 Level in normal range and within 720 days    Vitamin B-12  Date Value Ref Range Status  09/08/2021 391 180 - 914 pg/mL Final    Comment:    (NOTE) This assay is not validated for testing neonatal or myeloproliferative syndrome specimens for Vitamin B12 levels. Performed at [facility], [place] [address], [place], [place] [address]          Passed - Valid encounter within last 6 months    Recent Outpatient Visits          Yesterday  Annual physical exam   [facility] Jon Billings, NP   2 months ago Wound of left lower extremity, initial encounter   [facility] Jon Billings, NP   2 months ago Primary hypertension   [facility] Jon Billings, NP      Future Appointments            In 6 months Jon Billings, NP [facility]           Passed - CBC within normal limits and completed in the last 12 months    WBC  Date Value Ref Range Status  10/23/2021 6.2 3.4 - 10.8 x10E3/uL Final  05/30/2021 6.7 4.0 - 10.5 K/uL Final   RBC  Date Value Ref Range Status  10/23/2021 4.64 4.14 - 5.80 x10E6/uL Final  05/30/2021 4.78 4.22 - 5.81 MIL/uL Final   Hemoglobin  Date Value Ref Range Status  10/23/2021 13.2 13.0 - 17.7 g/dL Final   Hematocrit  Date Value Ref Range Status  10/23/2021 40.0 37.5 - 51.0 % Final   MCHC  Date Value Ref Range Status  10/23/2021 33.0 31.5 - 35.7 g/dL Final  05/30/2021  31.8 30.0 - 36.0 g/dL Final   [facility]  Date Value Ref Range Status  10/23/2021 28.4 26.6 - 33.0 pg Final  05/30/2021 27.2 26.0 - 34.0 pg Final   MCV  Date Value Ref Range Status  10/23/2021 86 79 - 97 fL Final  03/13/2014 83 80 - 100 fL Final   No results found for: "PLTCOUNTKUC", "LABPLAT", "POCPLA" RDW  Date Value Ref Range Status  10/23/2021 13.7 11.6 - 15.4 % Final  03/13/2014 15.2 (H) 11.5 - 14.5 % Final

## 2021-10-24 NOTE — Progress Notes (Signed)
Hi Darren Allen. It was nice to see you yesterday.  Your lab work looks good.  Your A1c is 7.8.  Continue to follow up with Endocrinology.  Your Triglycerides are high.  Please decrease processed foods and refined sugar.  Continue with your current medication regimen.  Follow up as discussed.  Please let me know if you have any questions.

## 2021-10-26 NOTE — Progress Notes (Unsigned)
PROVIDER NOTE: Information contained herein reflects review and annotations entered in association with encounter. Interpretation of such information and data should be left to medically-trained personnel. Information provided to patient can be located elsewhere in the medical record under "Patient Instructions". Document created using STT-dictation technology, any transcriptional errors that may result from process are unintentional.    Patient: Darren Allen  Service Category: E/M  Provider: Gaspar Cola, MD  DOB: 09-03-1965  DOS: 10/29/2021  Referring Provider: Jon Billings, NP  MRN: [id number]  Specialty: Interventional Pain Management  PCP: Jon Billings, NP  Type: Established Patient  Setting: Ambulatory outpatient    Location: Office  Delivery: Face-to-face     Primary Reason(s) for Visit: Encounter for evaluation before starting new chronic pain management plan of care (Level of risk: moderate) CC: No chief complaint on file.  HPI  [name] is a 56 y.o. year old, male patient, who comes today for a follow-up evaluation to review the test results and decide on a treatment plan. He has Chronic obstructive pulmonary disease ([place]); Acid reflux; HLD (hyperlipidemia); Eunuchoidism; Calculus of kidney; Adiposity; Apnea, sleep; Testicular hypofunction; Allergic state; Perirectal abscess; Perianal abscess; Ankle fracture; Hypertension; Neuropathy; Diabetic peripheral neuropathy ([place]); Type 2 diabetes mellitus with diabetic neuropathy, without long-term current use of insulin ([place]); Gout; PVD (peripheral vascular disease) ([place]); Chronic ankle pain, bilateral; Chronic low back pain (1ry area of Pain) (Bilateral) (R>L) w/o sciatica; Other acquired hammer toe; Chronic pain syndrome; Pharmacologic therapy; Disorder of skeletal system; Problems influencing health status; Lumbar facet syndrome; Lumbosacral radiculopathy at S1 (Left); Chronic feet pain (2ry area of Pain) (Bilateral); Chronic  ankle pain (Left); Abnormal NCS (nerve conduction studies) (02/20/2020); and Elevated C-reactive protein (CRP) on their problem list. His primarily concern today is the No chief complaint on file.  Pain Assessment: Location:     Radiating:   Onset:   Duration:   Quality:   Severity:  /10 (subjective, self-reported pain score)  Effect on ADL:   Timing:   Modifying factors:   BP:    HR:    [name] comes in today for a follow-up visit after his initial evaluation on 09/08/2021. Today we went over the results of his tests. These were explained in "Layman's terms". During today's appointment we went over my diagnostic impression, as well as the proposed treatment plan.  ***  In considering the treatment plan options, [name] was reminded that I no longer take patients for medication management only. I asked him to let me know if he had no intention of taking advantage of the interventional therapies, so that we could make arrangements to provide this space to someone interested. I also made it clear that undergoing interventional therapies for the purpose of getting pain medications is very inappropriate on the part of a patient, and it will not be tolerated in this practice. This type of behavior would suggest true addiction and therefore it requires referral to an addiction specialist.   Further details on both, my assessment(s), as well as the proposed treatment plan, please see below.  Controlled Substance Pharmacotherapy Assessment REMS (Risk Evaluation and Mitigation Strategy)  Opioid Analgesic: Hydrocodone/APAP 5/325 tablet, 1 tab p.o. twice daily (last filled on 08/19/2021) MME/day: 10 mg/day  Pill Count: None expected due to no prior prescriptions written by our practice. No notes on file Pharmacokinetics: Liberation and absorption (onset of action): WNL Distribution (time to peak effect): WNL Metabolism and excretion (duration of action): WNL  Pharmacodynamics: Desired  effects: Analgesia: [name] reports >50% benefit. Functional ability: Patient reports that medication allows him to accomplish basic ADLs Clinically meaningful improvement in function (CMIF): Sustained CMIF goals met Perceived effectiveness: Described as relatively effective, allowing for increase in activities of daily living (ADL) Undesirable effects: Side-effects or Adverse reactions: None reported Monitoring: [place] PMP: PDMP reviewed during this encounter. Online review of the past 71-month period previously conducted. Not applicable at this point since we have not taken over the patient's medication management yet. List of other Serum/Urine Drug Screening Test(s):  Lab Results  Component Value Date   COCAINSCRNUR Negative 09/08/2021   CANNABQUANT Negative 09/08/2021   ETH 179 (H) 03/22/2017   ETH 23 (H) 12/10/2016   List of all UDS test(s) done:  Lab Results  Component Value Date   SUMMARY Note 09/08/2021   Last UDS on record: Summary  Date Value Ref Range Status  09/08/2021 Note  Final    Comment:    ==================================================================== Compliance Drug Analysis, Ur ==================================================================== Test                             Result       Flag       Units  Drug Present and Declared for Prescription Verification   Hydrocodone                    697          EXPECTED   ng/mg creat   Dihydrocodeine                 36           EXPECTED   ng/mg creat   Norhydrocodone                 1265         EXPECTED   ng/mg creat    Sources of hydrocodone include scheduled prescription medications.    Dihydrocodeine and norhydrocodone are expected metabolites of    hydrocodone. Dihydrocodeine is also available as a scheduled    prescription medication.    Gabapentin                     PRESENT      EXPECTED   Amitriptyline                  PRESENT      EXPECTED   Nortriptyline                  PRESENT       EXPECTED    Nortriptyline is an expected metabolite of amitriptyline.    Acetaminophen                  PRESENT      EXPECTED  Drug Absent but Declared for Prescription Verification   Metoprolol                     Not Detected UNEXPECTED ==================================================================== Test                      Result    Flag   Units      Ref Range   Creatinine              284              mg/dL      >=  20 ==================================================================== Declared Medications:  The flagging and interpretation on this report are based on the  following declared medications.  Unexpected results may arise from  inaccuracies in the declared medications.   **Note: The testing scope of this panel includes these medications:   Amitriptyline (Elavil)  Gabapentin (Neurontin)  Hydrocodone (Norco)  Metoprolol   **Note: The testing scope of this panel does not include small to  moderate amounts of these reported medications:   Acetaminophen (Tylenol)  Acetaminophen (Norco)   **Note: The testing scope of this panel does not include the  following reported medications:   Albuterol (Proair HFA)  Fluticasone (Flonase)  Fluticasone (Trelegy)  Hydrochlorothiazide  Insulin (Humulin)  Iron  Levalbuterol (Xopenex)  Lisinopril (Zestril)  Metformin  Montelukast  Pantoprazole (Protonix)  Rosuvastatin (Crestor)  Testosterone  Triamcinolone (Kenalog)  Umeclidinium (Trelegy)  Vilanterol (Trelegy)  Vitamin C ==================================================================== For clinical consultation, please call [phone]. ====================================================================    UDS interpretation: No unexpected findings.          Medication Assessment Form: Not applicable. No opioids. Treatment compliance: Not applicable Risk Assessment Profile: Aberrant behavior: See initial evaluations. None observed or detected  today Comorbid factors increasing risk of overdose: See initial evaluation. No additional risks detected today Opioid risk tool (ORT):      No data to display          ORT Scoring interpretation table:  Score <3 = Low Risk for SUD  Score between 4-7 = Moderate Risk for SUD  Score >8 = High Risk for Opioid Abuse   Risk of substance use disorder (SUD): Low  Risk Mitigation Strategies:  Patient opioid safety counseling: No controlled substances prescribed. Patient-Prescriber Agreement (PPA): No agreement signed.  Controlled substance notification to other providers: None required. No opioid therapy.  Pharmacologic Plan: Non-opioid analgesic therapy offered. Interventional alternatives discussed.             Laboratory Chemistry Profile   Renal Lab Results  Component Value Date   BUN 13 10/23/2021   CREATININE 1.22 10/23/2021   BCR 11 10/23/2021   GFRAA 59 (L) 12/07/2019   GFRNONAA >60 05/30/2021   SPECGRAV 1.025 10/23/2021   PHUR 6.0 10/23/2021   PROTEINUR 1+ (A) 10/23/2021     Electrolytes Lab Results  Component Value Date   NA 138 10/23/2021   K 4.3 10/23/2021   CL 100 10/23/2021   CALCIUM 9.9 10/23/2021   MG 1.7 09/08/2021   PHOS 3.2 04/01/2010     Hepatic Lab Results  Component Value Date   AST 25 10/23/2021   ALT 29 10/23/2021   ALBUMIN 4.4 10/23/2021   ALKPHOS 70 10/23/2021     ID Lab Results  Component Value Date   HIV Non Reactive 05/29/2021   SARSCOV2NAA NEGATIVE 07/10/2021   STAPHAUREUS NEGATIVE 03/23/2017   MRSAPCR NEGATIVE 03/23/2017     Bone Lab Results  Component Value Date   25OHVITD1 44 09/08/2021   25OHVITD2 <1.0 09/08/2021   25OHVITD3 43 09/08/2021   TESTOSTERONE 757 09/12/2021     Endocrine Lab Results  Component Value Date   GLUCOSE 172 (H) 10/23/2021   GLUCOSEU Negative 10/23/2021   HGBA1C 7.8 (H) 10/23/2021   TSH 2.040 10/23/2021   TESTOSTERONE 757 09/12/2021     Neuropathy Lab Results  Component Value Date    VITAMINB12 391 09/08/2021   HGBA1C 7.8 (H) 10/23/2021   HIV Non Reactive 05/29/2021     CNS No results found for: "COLORCSF", "APPEARCSF", "RBCCOUNTCSF", "WBCCSF", "POLYSCSF", "LYMPHSCSF", "EOSCSF", "  PROTEINCSF", "GLUCCSF", "JCVIRUS", "CSFOLI", "IGGCSF", "LABACHR", "ACETBL"   Inflammation (CRP: Acute  ESR: Chronic) Lab Results  Component Value Date   CRP 1.8 (H) 09/08/2021   ESRSEDRATE 17 09/08/2021   LATICACIDVEN 1.7 05/28/2021     Rheumatology Lab Results  Component Value Date   LABURIC 6.8 05/28/2021     Coagulation Lab Results  Component Value Date   INR 0.90 03/22/2017   LABPROT 12.1 03/22/2017   APTT 26 03/22/2017   PLT 216 10/23/2021     Cardiovascular Lab Results  Component Value Date   BNP 23.0 02/11/2015   CKTOTAL 477 (H) 09/25/2019   CKMB 2.4 04/02/2010   TROPONINI <0.03 12/10/2016   HGB 13.2 10/23/2021   HCT 40.0 10/23/2021     Screening Lab Results  Component Value Date   SARSCOV2NAA NEGATIVE 07/10/2021   STAPHAUREUS NEGATIVE 03/23/2017   MRSAPCR NEGATIVE 03/23/2017   HIV Non Reactive 05/29/2021     Cancer No results found for: "CEA", "CA125", "LABCA2"   Allergens No results found for: "ALMOND", "APPLE", "ASPARAGUS", "AVOCADO", "BANANA", "BARLEY", "BASIL", "BAYLEAF", "GREENBEAN", "LIMABEAN", "WHITEBEAN", "BEEFIGE", "REDBEET", "BLUEBERRY", "BROCCOLI", "CABBAGE", "MELON", "CARROT", "CASEIN", "CASHEWNUT", "CAULIFLOWER", "CELERY"     Note: Lab results reviewed.  Recent Diagnostic Imaging Review  Cervical Imaging: Cervical MR wo contrast: No results found for this or any previous visit.  Cervical MR wo contrast: No valid procedures specified. Cervical CT wo contrast: No results found for this or any previous visit.  Cervical DG Bending/F/E views: No results found for this or any previous visit.   Shoulder Imaging: Shoulder-R MR wo contrast: No results found for this or any previous visit.  Shoulder-L MR wo contrast: No results found for  this or any previous visit.  Shoulder-R DG: No results found for this or any previous visit.  Shoulder-L DG: No results found for this or any previous visit.   Thoracic Imaging: Thoracic MR wo contrast: No results found for this or any previous visit.  Thoracic MR wo contrast: No valid procedures specified. Thoracic CT wo contrast: No results found for this or any previous visit.  Thoracic DG 4 views: No results found for this or any previous visit.  Thoracic DG w/swimmers view: No results found for this or any previous visit.   Lumbosacral Imaging: Lumbar MR wo contrast: Results for orders placed during the hospital encounter of 03/11/20  MR LUMBAR SPINE WO CONTRAST  Narrative CLINICAL DATA:  Progressive chronic low back pain. Radiating into bilateral legs, right worse than left.  EXAM: MRI LUMBAR SPINE WITHOUT CONTRAST  TECHNIQUE: Multiplanar, multisequence MR imaging of the lumbar spine was performed. No intravenous contrast was administered.  COMPARISON:  Lumbar radiographs January 26, 2020.  FINDINGS: Segmentation: The inferior-most fully formed intervertebral disc is labeled L5-S1.  Alignment:  Physiologic.  Vertebrae:  Vertebral body heights are maintained.  Conus medullaris and cauda equina: Conus extends to the L1 level. Conus appears normal.  Paraspinal and other soft tissues: Unremarkable.  Disc levels:  T12-L1: No significant disc protrusion, foraminal stenosis, or canal stenosis.  L1-L2: No significant disc protrusion, foraminal stenosis, or canal stenosis.  L2-L3: Mild disc bulging without significant canal or foraminal stenosis.  L3-L4: No significant disc protrusion, foraminal stenosis, or canal stenosis.  L4-L5: Mild broad disc bulge without significant canal or foraminal stenosis.  L5-S1: Disc desiccation and height loss. Broad-based disc bulge with superimposed central disc protrusion. No significant central canal or foraminal  stenosis. Mild left subarticular recess stenosis  IMPRESSION: Mild multilevel degenerative change (  as detailed above) without significant canal or foraminal stenosis. Mild left subarticular recess stenosis at L5-S1.   Electronically Signed By: Margaretha Sheffield MD On: 03/11/2020 12:19  Lumbar MR wo contrast: No valid procedures specified. Lumbar CT wo contrast: No results found for this or any previous visit.  Lumbar DG Bending views: Results for orders placed during the hospital encounter of 09/08/21  DG Lumbar Spine Complete W/Bend  Narrative CLINICAL DATA:  Chronic low back pain for 15 years.  EXAM: LUMBAR SPINE - COMPLETE WITH BENDING VIEWS  COMPARISON:  October 01, 2020  FINDINGS: There is no evidence of lumbar spine fracture. Alignment is normal. Intervertebral disc spaces are maintained. Minimal anterior osteophytosis is identified at L2 and L3.  IMPRESSION: Minimal degenerative joint changes of lumbar spine.   Electronically Signed By: Abelardo Diesel M.D. On: 09/09/2021 10:28         Sacroiliac Joint Imaging: Sacroiliac Joint DG: No results found for this or any previous visit.   Hip Imaging: Hip-R MR wo contrast: No results found for this or any previous visit.  Hip-L MR wo contrast: No results found for this or any previous visit.  Hip-R CT wo contrast: No results found for this or any previous visit.  Hip-L CT wo contrast: No results found for this or any previous visit.  Hip-R DG 2-3 views: No results found for this or any previous visit.  Hip-L DG 2-3 views: No results found for this or any previous visit.  Hip-B DG Bilateral: No results found for this or any previous visit.   Knee Imaging: Knee-R MR wo contrast: No results found for this or any previous visit.  Knee-L MR wo contrast: No results found for this or any previous visit.  Knee-R CT wo contrast: No results found for this or any previous visit.  Knee-L CT wo contrast: No results  found for this or any previous visit.  Knee-R DG 4 views: No results found for this or any previous visit.  Knee-L DG 4 views: No results found for this or any previous visit.   Ankle Imaging: Ankle-R DG Complete: No results found for this or any previous visit.  Ankle-L DG Complete: No results found for this or any previous visit.   Foot Imaging: Foot-R DG Complete: No results found for this or any previous visit.  Foot-L DG Complete: Results for orders placed during the hospital encounter of 05/27/21  DG Foot Complete Left  Narrative CLINICAL DATA:  A 56 year old male presents for evaluation of suspected foot infection, history of diabetes and previous ankle surgery. Greatest area of pain first metatarsophalangeal joint.  EXAM: LEFT FOOT - COMPLETE 3+ VIEW  COMPARISON:  None aside from ankle imaging from 2019.  FINDINGS: Signs of disuse osteopenia.  Erosive changes about the tip of the distal phalanx of the second digit with overhanging, well corticated margins.  Erosion along the distal aspect of the medial distal proximal phalanx of the great toe which is less well corticated. No signs of acute fracture. No signs of dislocation.  Soft tissue swelling about the great toe.  Irregularity of the proximal phalanx of the fifth digit. Suggest prior fracture of the distal aspect of the proximal phalanx of the fifth digit.  IMPRESSION: 1. Soft tissue swelling about the great toe. 2. Erosive changes about the distal phalanx of the second digit and also along the distal aspect of the proximal phalanx of the great toe. Findings about the great toe raising the question of  early osteomyelitis in the setting of soft tissue swelling. Findings about the second digit are better corticated than would be expected and show overhanging margins. Perhaps related to prior infection given well corticated margins. Correlate with any overlying signs of infection currently. MRI may be  helpful for further evaluation. 3. Fracture of the proximal phalanx of the fifth digit of uncertain chronicity. Potentially subacute.   Electronically Signed By: Zetta Bills M.D. On: 05/27/2021 16:01   Elbow Imaging: Elbow-R DG Complete: No results found for this or any previous visit.  Elbow-L DG Complete: No results found for this or any previous visit.   Wrist Imaging: Wrist-R DG Complete: No results found for this or any previous visit.  Wrist-L DG Complete: No results found for this or any previous visit.   Hand Imaging: Hand-R DG Complete: No results found for this or any previous visit.  Hand-L DG Complete: Results for orders placed during the hospital encounter of 12/10/16  DG Hand Complete Left  Narrative CLINICAL DATA:  Status post fall last night following a syncopal episode. Persistent left hand pain involving the palm in third digit.  EXAM: LEFT HAND - COMPLETE 3+ VIEW  COMPARISON:  None in PACs  FINDINGS: The bones are subjectively adequately mineralized. There is no acute fracture nor dislocation. The soft tissues are unremarkable. The interphalangeal and MCP joint spaces are reasonably well-maintained. The observed portions of the wrist exhibit no acute abnormalities. The soft tissues are unremarkable.  IMPRESSION: There is no acute bony abnormality of the left hand.   Electronically Signed By: David  Martinique M.D. On: 12/10/2016 14:55   Complexity Note: Imaging results reviewed.                         Meds   Current Outpatient Medications:    Accu-Chek Softclix Lancets lancets, 3 (three) times daily., Disp: , Rfl:    acetaminophen (TYLENOL) 500 MG tablet, Take 2 tablets (1,000 mg total) by mouth every 8 (eight) hours., Disp: 30 tablet, Rfl: 0   amitriptyline (ELAVIL) 10 MG tablet, Take 30 mg by mouth at bedtime., Disp: , Rfl:    ascorbic acid (VITAMIN C) 500 MG tablet, Take by mouth daily., Disp: , Rfl:    BD DISP NEEDLES 22G X  1-1/2" MISC, every 14 (fourteen) days., Disp: , Rfl:    ferrous sulfate 325 (65 FE) MG tablet, Take 325 mg by mouth daily with breakfast., Disp: , Rfl:    fluticasone (FLONASE) 50 MCG/ACT nasal spray, Place into both nostrils., Disp: , Rfl:    gabapentin (NEURONTIN) 600 MG tablet, Take 600 mg by mouth 3 (three) times daily., Disp: , Rfl:    hydrochlorothiazide (HYDRODIURIL) 25 MG tablet, Take 25 mg by mouth daily., Disp: , Rfl: 7   HYDROcodone-acetaminophen (NORCO/VICODIN) 5-325 MG tablet, Take 1 tablet by mouth 2 (two) times daily as needed., Disp: 20 tablet, Rfl: 0   insulin isophane & regular human (HUMULIN 70/30 KWIKPEN) (70-30) 100 UNIT/ML KwikPen, Inject 42-48 Units into the skin 2 (two) times daily with a meal. 48 units every morning and 42 units every evening, Disp: , Rfl:    lisinopril (PRINIVIL,ZESTRIL) 40 MG tablet, Take 40 mg by mouth daily., Disp: , Rfl: 11   metFORMIN (GLUCOPHAGE-XR) 500 MG 24 hr tablet, Take 4 tablets (2,000 mg total) by mouth daily., Disp: 90 tablet, Rfl: 1   metoprolol tartrate (LOPRESSOR) 100 MG tablet, Take 1 tablet (100 mg total) by mouth once for  1 dose. Please take one time dose $RemoveB'100mg'KcTRWoVB$  metoprolol tartrate 2 hr prior to cardiac CT for HR control IF HR >55bpm., Disp: 1 tablet, Rfl: 0   montelukast (SINGULAIR) 10 MG tablet, Take 10 mg by mouth daily., Disp: , Rfl:    NEEDLE, DISP, 18 G (B-D BLUNT FILL NEEDLE) 18G X 1-1/2" MISC, Use 18G needle to draw testosterone for injection, Disp: 50 each, Rfl: 0   pantoprazole (PROTONIX) 40 MG tablet, Take 80 mg by mouth daily., Disp: , Rfl: 11   PROAIR HFA 108 (90 Base) MCG/ACT inhaler, Inhale 2 puffs into the lungs every 6 (six) hours as needed., Disp: , Rfl:    rosuvastatin (CRESTOR) 40 MG tablet, Take 40 mg by mouth daily., Disp: , Rfl:    SYRINGE-NEEDLE, DISP, 3 ML (BD ECLIPSE SYRINGE) 21G X 1" 3 ML MISC, Use to administer testosterone, Disp: 50 each, Rfl: 0   testosterone cypionate (DEPOTESTOSTERONE CYPIONATE) 200 MG/ML  injection, Inject 1 mL (200 mg total) into the muscle every 14 (fourteen) days., Disp: 10 mL, Rfl: 0   TRELEGY ELLIPTA 200-62.5-25 MCG/INH AEPB, Inhale 1 puff into the lungs daily., Disp: , Rfl:    triamcinolone cream (KENALOG) 0.1 %, Apply 1 application  topically 2 (two) times daily., Disp: 30 g, Rfl: 0   TRULICITY 1.5 AT/5.5DD SOPN, Inject 3 mg into the skin once a week., Disp: , Rfl:   ROS  Constitutional: Denies any fever or chills Gastrointestinal: No reported hemesis, hematochezia, vomiting, or acute GI distress Musculoskeletal: Denies any acute onset joint swelling, redness, loss of ROM, or weakness Neurological: No reported episodes of acute onset apraxia, aphasia, dysarthria, agnosia, amnesia, paralysis, loss of coordination, or loss of consciousness  Allergies  Mr. Espindola is allergic to glipizide, cephalexin, and duloxetine.  PFSH  Drug: Mr. Oestreich  reports no history of drug use. Alcohol:  reports that he does not currently use alcohol after a past usage of about 12.0 standard drinks of alcohol per week. Tobacco:  reports that he has been smoking cigarettes. He has a 17.00 pack-year smoking history. He has never used smokeless tobacco. Medical:  has a past medical history of Allergy, Calculus of kidney (02/04/2015), COPD (chronic obstructive pulmonary disease) ([place]), Diabetes mellitus without complication ([place]), Hypertension, and Neuropathy. Surgical: Mr. More  has a past surgical history that includes Appendectomy; Incision and drainage perirectal abscess (N/A, 02/04/2015); Rectal exam under anesthesia (02/04/2015); ORIF ankle fracture (Left, 03/23/2017); and Syndesmosis repair (Left, 03/23/2017). Family: family history includes Alcohol abuse in his father; Cancer in his father; Cancer (age of onset: 14) in his brother; Cancer (age of onset: 54) in his mother; Diabetes in his brother; Heart disease in his brother and father.  Constitutional Exam  General appearance: Well nourished,  well developed, and well hydrated. In no apparent acute distress There were no vitals filed for this visit. BMI Assessment: Estimated body mass index is 32.78 kg/m as calculated from the following:   Height as of 10/23/21: 6' 1.23" (1.86 m).   Weight as of 10/23/21: 250 lb (113.4 kg).  BMI interpretation table: BMI level Category Range association with higher incidence of chronic pain  <18 kg/m2 Underweight   18.5-24.9 kg/m2 Ideal body weight   25-29.9 kg/m2 Overweight Increased incidence by 20%  30-34.9 kg/m2 Obese (Class I) Increased incidence by 68%  35-39.9 kg/m2 Severe obesity (Class II) Increased incidence by 136%  >40 kg/m2 Extreme obesity (Class III) Increased incidence by 254%   Patient's current BMI Ideal Body weight  There is no height or weight on file to calculate BMI. Ideal body weight: 80.4 kg (177 lb 4.9 oz) Adjusted ideal body weight: 93.6 kg (206 lb 6.1 oz)   BMI Readings from Last 4 Encounters:  10/23/21 32.78 kg/m  09/08/21 32.55 kg/m  08/25/21 33.15 kg/m  08/15/21 32.88 kg/m   Wt Readings from Last 4 Encounters:  10/23/21 250 lb (113.4 kg)  09/08/21 240 lb (108.9 kg)  08/25/21 247 lb 6.4 oz (112.2 kg)  08/15/21 245 lb 6.4 oz (111.3 kg)    Psych/Mental status: Alert, oriented x 3 (person, place, & time)       Eyes: PERLA Respiratory: No evidence of acute respiratory distress  Assessment & Plan  Primary Diagnosis & Pertinent Problem List: There were no encounter diagnoses.  Visit Diagnosis: No diagnosis found. Problems updated and reviewed during this visit: No problems updated.  Plan of Care  Pharmacotherapy (Medications Ordered): No orders of the defined types were placed in this encounter.  Procedure Orders    No procedure(s) ordered today   Lab Orders  No laboratory test(s) ordered today   Imaging Orders  No imaging studies ordered today   Referral Orders  No referral(s) requested today    Pharmacological management options:   Opioid Analgesics: I will not be prescribing any opioids at this time Membrane stabilizer: I will not be prescribing any at this time Muscle relaxant: I will not be prescribing any at this time NSAID: I will not be prescribing any at this time Other analgesic(s): I will not be prescribing any at this time     Interventional Therapies  Risk  Complexity Considerations:   Estimated body mass index is 32.55 kg/m as calculated from the following:   Height as of this encounter: 6' (1.829 m).   Weight as of this encounter: 240 lb (108.9 kg). WNL   Planned  Pending:   Pending further evaluation   Under consideration:   Diagnostic bilateral lumbar facet MBB #1  Diagnostic left L5-S1 LESI #1  Possible spinal cord stimulator trial    Completed:   (09/08/2021) referral to physical therapy for evaluation and treatment of low back pain.   Therapeutic  Palliative (PRN) options:   None established     Provider-requested follow-up: No follow-ups on file. Recent Visits Date Type Provider Dept  09/08/21 Office Visit Milinda Pointer, MD [facility]  Showing recent visits within past 90 days and meeting all other requirements Future Appointments Date Type Provider Dept  10/29/21 Appointment Milinda Pointer, MD [facility]  Showing future appointments within next 90 days and meeting all other requirements  Primary Care Physician: Jon Billings, NP Note by: Gaspar Cola, MD Date: 10/29/2021; Time: 6:05 PM

## 2021-10-27 ENCOUNTER — Telehealth: Payer: Self-pay

## 2021-10-27 ENCOUNTER — Encounter: Payer: Self-pay | Admitting: Surgery

## 2021-10-27 ENCOUNTER — Ambulatory Visit (INDEPENDENT_AMBULATORY_CARE_PROVIDER_SITE_OTHER): Payer: HMO | Admitting: Surgery

## 2021-10-27 ENCOUNTER — Other Ambulatory Visit: Payer: Self-pay

## 2021-10-27 VITALS — BP 147/92 | HR 125 | Temp 98.1°F | Ht 72.0 in | Wt 252.4 lb

## 2021-10-27 DIAGNOSIS — K449 Diaphragmatic hernia without obstruction or gangrene: Secondary | ICD-10-CM

## 2021-10-27 DIAGNOSIS — K429 Umbilical hernia without obstruction or gangrene: Secondary | ICD-10-CM

## 2021-10-27 NOTE — Patient Instructions (Addendum)
Cardiac Clearance faxed to Dr.Fath  Pulmonary Clearance faxed to [facility]. Please call their office to see if you need to schedule an appointment with them prior to the clearances being completed.    Barium Swallow scheduled @ [place] on 11/06/21 @ 9:30am. Please do not eat/drink 3 hours prior.  Keep your appointment with Dr.Anna.   Please call our office after you have your Endoscopy to schedule a follow up with Dr.Pabon to discuss your surgery.                                                                                                                                                                                                                  Hiatal Hernia  A hiatal hernia occurs when part of the stomach slides above the muscle that separates the abdomen from the chest (diaphragm). A person can be born with a hiatal hernia (congenital), or it may develop over time. In almost all cases of hiatal hernia, only the top part of the stomach pushes through the diaphragm. Many people have a hiatal hernia with no symptoms. The larger the hernia, the more likely it is that you will have symptoms. In some cases, a hiatal hernia allows stomach acid to flow back into the tube that carries food from your mouth to your stomach (esophagus). This may cause heartburn symptoms. The development of heartburn symptoms may mean that you have a condition called gastroesophageal reflux disease (GERD). What are the causes? This condition is caused by a weakness in the opening (hiatus) where the esophagus passes through the diaphragm to attach to the upper part of the stomach. A person may be born with a weakness in the hiatus, or a weakness can develop over time. What increases the risk? This condition is more likely to develop in: Older people. Age is a major risk factor for a hiatal hernia, especially if you are over the age of 37. Pregnant women. People who are overweight. People who have frequent  constipation. What are the signs or symptoms? Symptoms of this condition usually develop in the form of GERD symptoms. Symptoms include: Heartburn. Upset stomach (indigestion). Trouble swallowing. Coughing or wheezing. Wheezing is making high-pitched whistling sounds when you breathe. Sore throat. Chest pain. Nausea and vomiting. How is this diagnosed? This condition may be diagnosed during testing for GERD. Tests that may be done include: X-rays of your stomach or chest. An upper gastrointestinal (GI) series. This is an X-ray exam of your GI tract that is taken after you swallow  a chalky liquid that shows up clearly on the X-ray. Endoscopy. This is a procedure to look into your stomach using a thin, flexible tube that has a tiny camera and light on the end of it. How is this treated? This condition may be treated by: Dietary and lifestyle changes to help reduce GERD symptoms. Medicines. These may include: Over-the-counter antacids. Medicines that make your stomach empty more quickly. Medicines that block the production of stomach acid (H2 blockers). Stronger medicines to reduce stomach acid (proton pump inhibitors). Surgery to repair the hernia, if other treatments are not helping. If you have no symptoms, you may not need treatment. Follow these instructions at home: Lifestyle and activity Do not use any products that contain nicotine or tobacco. These products include cigarettes, chewing tobacco, and vaping devices, such as e-cigarettes. If you need help quitting, ask your health care provider. Try to achieve and maintain a healthy body weight. Avoid putting pressure on your abdomen. Anything that puts pressure on your abdomen increases the amount of acid that may be pushed up into your esophagus. Avoid bending over, especially after eating. Raise the head of your bed by putting blocks under the legs. This keeps your head and esophagus higher than your stomach. Do not wear tight  clothing around your chest or stomach. Try not to strain when having a bowel movement, when urinating, or when lifting heavy objects. Eating and drinking Avoid foods that can worsen GERD symptoms. These may include: Fatty foods, like fried foods. Citrus fruits, like oranges or lemon. Other foods and drinks that contain acid, like orange juice or tomatoes. Spicy food. Chocolate. Eat frequent small meals instead of three large meals a day. This helps prevent your stomach from getting too full. Eat slowly. Do not lie down right after eating. Do not eat 1-2 hours before bed. Do not drink beverages with caffeine. These include cola, coffee, cocoa, and tea. Do not drink alcohol. General instructions Take over-the-counter and prescription medicines only as told by your health care provider. Keep all follow-up visits. Your health care provider will want to check that any new prescribed medicines are helping your symptoms. Contact a health care provider if: Your symptoms are not controlled with medicines or lifestyle changes. You are having trouble swallowing. You have coughing or wheezing that will not go away. Your pain is getting worse. Your pain spreads to your arms, neck, jaw, teeth, or back. You feel nauseous or you vomit. Get help right away if: You have shortness of breath. You vomit blood. You have bright red blood in your stools. You have black, tarry stools. These symptoms may be an emergency. Get help right away. Call 911. Do not wait to see if the symptoms will go away. Do not drive yourself to the hospital. Summary A hiatal hernia occurs when part of the stomach slides above the muscle that separates the abdomen from the chest. A person may be born with a weakness in the hiatus, or a weakness can develop over time. Symptoms of a hiatal hernia may include heartburn, trouble swallowing, or sore throat. Management of a hiatal hernia includes eating frequent small meals instead  of three large meals a day. Get help right away if you vomit blood, have bright red blood in your stools, or have black, tarry stools. This information is not intended to replace advice given to you by your health care provider. Make sure you discuss any questions you have with your health care provider. Document Revised: 04/29/2021 Document  Reviewed: 04/29/2021 Elsevier Patient Education  [place].

## 2021-10-27 NOTE — Telephone Encounter (Signed)
Pulmopnary Clearance sent to [facility] at [place] [facility].  Cardiac Clearance faxed to Dr.Fath at this time.

## 2021-10-27 NOTE — Progress Notes (Signed)
Patient ID: Darren Allen, male   DOB: [birth year], 56 y.o.   MRN: [id number]  HPI Darren Allen is a 56 y.o. male in consultation at the request of Dr.Aleskerov for paraesophageal hernia.  History Darren Allen has a long history of COPD, dm and currently smokes daily between half a pack and a pack a day.  He has significant pulmonary issues with dyspnea on exertion.  He uses multiple inhalers and uses steroids.  He has been evaluated by pulmonary.  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  There is also chronic pulmonary changes.  CBC and CMP is normal except mild elevation of the glucose. Did have prior history of appendectomy as a child requiring laparotomy.   had ankle fracture repair by Dr. Posey Pronto.   HPI  Past Medical History:  Diagnosis Date   Allergy    Calculus of kidney 02/04/2015   COPD (chronic obstructive pulmonary disease) (HCC)    Diabetes mellitus without complication ([place])    type 2   Hypertension    Neuropathy     Past Surgical History:  Procedure Laterality Date   APPENDECTOMY     INCISION AND DRAINAGE PERIRECTAL ABSCESS N/A 02/04/2015   Procedure: IRRIGATION AND DEBRIDEMENT PERIRECTAL ABSCESS;  Surgeon: Marlyce Huge, MD;  Location: [facility] ORS;  Service: General;  Laterality: N/A;   ORIF ANKLE FRACTURE Left 03/23/2017   Procedure: OPEN REDUCTION INTERNAL FIXATION (ORIF) ANKLE FRACTURE;  Surgeon: Leim Fabry, MD;  Location: [facility] ORS;  Service: Orthopedics;  Laterality: Left;   RECTAL EXAM UNDER ANESTHESIA  02/04/2015   Procedure: RECTAL EXAM UNDER ANESTHESIA;  Surgeon: Marlyce Huge, MD;  Location: [facility] ORS;  Service: General;;   SYNDESMOSIS REPAIR Left 03/23/2017   Procedure: SYNDESMOSIS REPAIR;  Surgeon: Leim Fabry, MD;  Location: [facility] ORS;  Service: Orthopedics;  Laterality: Left;    Family History  Problem Relation Age of  Onset   Cancer Mother 49       Lung   Cancer Father        Colon   Heart disease Father    Alcohol abuse Father    Cancer Brother 79       Esophageal   Diabetes Brother    Heart disease Brother     Social History Social History   Tobacco Use   Smoking status: Every Day    Packs/day: 1.00    Years: 34.00    Total pack years: 34.00    Types: Cigarettes   Smokeless tobacco: Never   Tobacco comments:    patient using Nicoderm patches  Vaping Use   Vaping Use: Never used  Substance Use Topics   Alcohol use: Not Currently    Alcohol/week: 12.0 standard drinks of alcohol    Types: 12 Cans of beer per week    Comment: Quit February 2023   Drug use: No    Allergies  Allergen Reactions   Glipizide Other (See Comments) and Palpitations    Shaky, feel bad Other reaction(s): Dizziness   Cephalexin Rash   Duloxetine Anxiety and Nausea Only    Current Outpatient Medications  Medication Sig Dispense Refill   Accu-Chek Softclix Lancets lancets 3 (three) times daily.     acetaminophen (TYLENOL) 500 MG tablet Take 2 tablets (1,000 mg total) by mouth every 8 (eight) hours. 30 tablet 0   amitriptyline (ELAVIL) 10 MG tablet Take 30  mg by mouth at bedtime.     ascorbic acid (VITAMIN C) 500 MG tablet Take by mouth daily.     BD DISP NEEDLES 22G X 1-1/2" MISC every 14 (fourteen) days.     ferrous sulfate 325 (65 FE) MG tablet Take 325 mg by mouth daily with breakfast.     fluticasone (FLONASE) 50 MCG/ACT nasal spray Place into both nostrils.     gabapentin (NEURONTIN) 600 MG tablet Take 600 mg by mouth 3 (three) times daily.     hydrochlorothiazide (HYDRODIURIL) 25 MG tablet Take 25 mg by mouth daily.  7   HYDROcodone-acetaminophen (NORCO/VICODIN) 5-325 MG tablet Take 1 tablet by mouth 2 (two) times daily as needed. 20 tablet 0   insulin isophane & regular human (HUMULIN 70/30 KWIKPEN) (70-30) 100 UNIT/ML KwikPen Inject 42-48 Units into the skin 2 (two) times daily with a meal. 48  units every morning and 42 units every evening     lisinopril (PRINIVIL,ZESTRIL) 40 MG tablet Take 40 mg by mouth daily.  11   metFORMIN (GLUCOPHAGE-XR) 500 MG 24 hr tablet Take 4 tablets (2,000 mg total) by mouth daily. 90 tablet 1   metFORMIN (GLUCOPHAGE-XR) 500 MG 24 hr tablet Take by mouth.     montelukast (SINGULAIR) 10 MG tablet Take 10 mg by mouth daily.     NEEDLE, DISP, 18 G (B-D BLUNT FILL NEEDLE) 18G X 1-1/2" MISC Use 18G needle to draw testosterone for injection 50 each 0   pantoprazole (PROTONIX) 40 MG tablet Take 80 mg by mouth daily.  11   PROAIR HFA 108 (90 Base) MCG/ACT inhaler Inhale 2 puffs into the lungs every 6 (six) hours as needed.     rosuvastatin (CRESTOR) 40 MG tablet Take 40 mg by mouth daily.     SYRINGE-NEEDLE, DISP, 3 ML (BD ECLIPSE SYRINGE) 21G X 1" 3 ML MISC Use to administer testosterone 50 each 0   testosterone cypionate (DEPOTESTOSTERONE CYPIONATE) 200 MG/ML injection Inject 1 mL (200 mg total) into the muscle every 14 (fourteen) days. 10 mL 0   TRELEGY ELLIPTA 200-62.5-25 MCG/INH AEPB Inhale 1 puff into the lungs daily.     triamcinolone cream (KENALOG) 0.1 % Apply 1 application  topically 2 (two) times daily. 30 g 0   TRULICITY 1.5 OM/3.5DH SOPN Inject 3 mg into the skin once a week.     metoprolol tartrate (LOPRESSOR) 100 MG tablet Take 1 tablet (100 mg total) by mouth once for 1 dose. Please take one time dose '100mg'$  metoprolol tartrate 2 hr prior to cardiac CT for HR control IF HR >55bpm. 1 tablet 0   No current facility-administered medications for this visit.     Review of Systems Full ROS  was asked and was negative except for the information on the HPI  Physical Exam Blood pressure (!) 147/92, pulse (!) 125, temperature 98.1 F (36.7 C), temperature source Oral, height 6' (1.829 m), weight 252 lb 6.4 oz (114.5 kg), SpO2 96 %. CONSTITUTIONAL: NAD. EYES: Pupils are equal, round,  Sclera are non-icteric. EARS, NOSE, MOUTH AND THROAT:  The oral  mucosa is pink and moist. Hearing is intact to voice. LYMPH NODES:  Lymph nodes in the neck are normal. RESPIRATORY:  Lungs with wheezes and rales. There is normal respiratory effort, with equal breath sounds bilaterally, and without pathologic use of accessory muscles. CARDIOVASCULAR: Heart is regular without murmurs, gallops, or rubs. GI: The abdomen is  soft, nontender, and nondistended. Reducible 2 cms umbilical hernia, There are  no palpable masses. There is no hepatosplenomegaly. There are normal bowel sounds  GU: Rectal deferred.   MUSCULOSKELETAL: Normal muscle strength and tone. No cyanosis or edema.   SKIN: Turgor is good and there are no pathologic skin lesions or ulcers. NEUROLOGIC: Motor and sensation is grossly normal. Cranial nerves are grossly intact. PSYCH:  Oriented to person, place and time. Affect is normal.  Data Reviewed  I have personally reviewed the patient's imaging, laboratory findings and medical records.    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  I do think that we will need to further work-up this hiatal hernia at some point in time to fix it.  I do think that the hernia is interfering with his pulmonary issues.  Patient had a reducible umbilical hernia that I think can be fixed at the same operative setting.  Discussed with the patient in detail about his disease process.  We will start work-up with a CT scan of the abdomen pelvis, barium swallow as well as endoscopic evaluation by GI. Discussed with him about optimization of weight.  Also request cardiac optimization. Smoking cessation would also be ideal. I spent greater than 55 minutes's encounter including personally reviewing imaging studies, medical records, placing orders, counseling the patient and performing appropriate mentation.  A copy of this report was sent to the referring provider  Caroleen Hamman, MD FACS General Surgeon 10/27/2021,  3:12 PM

## 2021-10-28 ENCOUNTER — Telehealth: Payer: Self-pay

## 2021-10-28 NOTE — Progress Notes (Unsigned)
Cardiology Clearance has been received from Dr Ubaldo Glassing. The patient is cleared at Low risk for surgery.

## 2021-10-28 NOTE — Telephone Encounter (Signed)
Pulmonary Clearance received from Dr.Aleskerov-low risk-patient optimized for surgery.

## 2021-10-29 ENCOUNTER — Other Ambulatory Visit: Payer: Self-pay

## 2021-10-29 ENCOUNTER — Ambulatory Visit: Payer: HMO | Attending: Pain Medicine | Admitting: Pain Medicine

## 2021-10-29 ENCOUNTER — Encounter: Payer: Self-pay | Admitting: Pain Medicine

## 2021-10-29 VITALS — BP 126/93 | HR 107 | Temp 98.4°F | Resp 18 | Ht 72.0 in | Wt 250.0 lb

## 2021-10-29 DIAGNOSIS — M79671 Pain in right foot: Secondary | ICD-10-CM | POA: Diagnosis present

## 2021-10-29 DIAGNOSIS — R94130 Abnormal response to nerve stimulation, unspecified: Secondary | ICD-10-CM

## 2021-10-29 DIAGNOSIS — R937 Abnormal findings on diagnostic imaging of other parts of musculoskeletal system: Secondary | ICD-10-CM | POA: Diagnosis present

## 2021-10-29 DIAGNOSIS — Z79891 Long term (current) use of opiate analgesic: Secondary | ICD-10-CM

## 2021-10-29 DIAGNOSIS — M4807 Spinal stenosis, lumbosacral region: Secondary | ICD-10-CM | POA: Diagnosis present

## 2021-10-29 DIAGNOSIS — Z79899 Other long term (current) drug therapy: Secondary | ICD-10-CM

## 2021-10-29 DIAGNOSIS — M5417 Radiculopathy, lumbosacral region: Secondary | ICD-10-CM | POA: Diagnosis present

## 2021-10-29 DIAGNOSIS — M5137 Other intervertebral disc degeneration, lumbosacral region: Secondary | ICD-10-CM

## 2021-10-29 DIAGNOSIS — M79672 Pain in left foot: Secondary | ICD-10-CM

## 2021-10-29 DIAGNOSIS — M545 Low back pain, unspecified: Secondary | ICD-10-CM | POA: Diagnosis present

## 2021-10-29 DIAGNOSIS — E1142 Type 2 diabetes mellitus with diabetic polyneuropathy: Secondary | ICD-10-CM | POA: Diagnosis present

## 2021-10-29 DIAGNOSIS — M47816 Spondylosis without myelopathy or radiculopathy, lumbar region: Secondary | ICD-10-CM

## 2021-10-29 DIAGNOSIS — G8929 Other chronic pain: Secondary | ICD-10-CM | POA: Diagnosis present

## 2021-10-29 MED ORDER — HYDROCODONE-ACETAMINOPHEN 5-325 MG PO TABS: 1.0000 | ORAL_TABLET | Freq: Two times a day (BID) | ORAL | 0 refills | Status: DC | PRN

## 2021-10-29 NOTE — Patient Instructions (Addendum)
____________________________________________________________________________________________  Pharmacy Shortages of Pain Medication   Introduction Shockingly as it may seem, .  "No [place] Supreme Court decision has ever interpreted the Constitution as guaranteeing a right to health care for all Americans." - https://www.healthequityandpolicylab.com/elusive-right-to-health-care-under-us-law  "With respect to human rights, the [place] has no formally codified right to health, nor does it participate in a human rights treaty that specifies a right to health." - Scott J. Schweikart, JD, MBE  Situation By now, most of our patients have had the experience of being told by their pharmacist that they do not have enough medication to cover their prescription. If you have not had this experience, just know that you soon will.  Problem There appears to be a shortage of these medications, either at the national level or locally. This is happening with all pharmacies. When there is not enough medication, patients are offered a partial fill and they are told that they will try to get the rest of the medicine for them at a later time. If they do not have enough for even a partial fill, the pharmacists are telling the patients to call us (the prescribing physicians) to request that we send another prescription to another pharmacy to get the medicine.   This reordering of a controlled substance creates documentation problems where additional paperwork needs to be created to explain why two prescriptions for the same period of time and the same medicine are being prescribed to the same patient. It also creates situations where the last appointment note does not accurately reflect when and what prescriptions were given to a patient. This leads to prescribing errors down the line, in subsequent follow-up visits.   [place] Board [employer]acy ([place]) Research revealed that Board of Pharmacy Rule .1806 (21  [place]AC 46.1806) authorizes pharmacists to the transfer of prescriptions among pharmacies, and it sets forth procedural and recordkeeping requirements for doing so. However, this requires the pharmacist to complete the previously mentioned procedural paperwork to accomplish the transfer. As it turns out, it is much easier for them to have the prescribing physicians do the work.   Possible solutions 1. You can ask your physician to assist you in weaning yourself off these medications. 2. Ask your pharmacy if the medication is in stock, 3 days prior to your refill. 3. If you need a pharmacy change, let us know at your medication management visit. Prescriptions that have already been electronically sent to a pharmacy will not be re-sent to a different pharmacy if your pharmacy of record does not have it in stock. Proper stocking of medication is a pharmacy problem, not a prescriber problem. Work with your pharmacist to solve the problem. 4. Have the [place] add a provision to the "STOP ACT" (the law that mandates how controlled substances are prescribed) where there is an exception to the electronic prescribing rule that states that in the event there are shortages of medications the physicians are allowed to use written prescriptions as opposed to electronic ones. This would allow patients to take their prescriptions to a different pharmacy that may have enough medication available to fill the prescription. The problem is that currently there is a law that does not allow for written prescriptions, with the exception of instances where the electronic medical record is down due to technical issues.  5. Have [place] Congress ease the pressure on pharmaceutical companies, allowing them to produce enough quantities of the medication to adequately supply the population. 6. Have pharmacies keep enough   stocks of these medications to cover their client base.  7. Have the [place] Assembly add  a provision to the "STOP ACT" where they ease the regulations surrounding the transfer of controlled substances between pharmacies, so as to simplify the transfer of supplies. As an alternative, develop a system to allow patients to obtain the remainder of their prescription at another one of their pharmacies or at an associate pharmacy.   How this shortage will affect you.  Understand that this is a pharmacy supply problem, not a prescriber problem. Work with your pharmacy to solve it. The job of the prescriber is to evaluate and monitor the patient for the appropriate indications and use of these medicines. It is not the job of the prescriber to supply the medication or to solve problems with that supply. The responsibility and the choice to obtain the medication resides on the patient. By law, supplying the medication is the job of the pharmacy. It is certainly not the job of the prescriber to solve supply problems.   Due to the above problems we are no longer taking patients to write for their pain medication. Future discussions with your physician may include potentially weaning medications or transitioning to alternatives.  We will be focusing primarily on interventional based pain management. We will continue to evaluate for appropriate indications and we may provide recommendations regarding medication, dose, and schedule, as well as monitoring recommendations, however, we will not be taking over the actual prescribing of these substances. On those patients where we are treating their chronic pain with interventional therapies, exceptions will be considered on a case by case basis. At this time, we will try to continue providing this supplemental service to those patients we have been managing in the past. However, as of August 1st, 2023, we no longer will be sending additional prescriptions to other pharmacies for the purpose of solving their supply problems. Once we send a prescription to a pharmacy,  we will not be resending it again to another pharmacy to cover for their shortages.   What to do. Write as many letters as you can. Recruit the help of family members in writing these letters. Below are some of the places where you can write to make your voice heard. Let them know what the problem is and push them to look for solutions.   Search internet for: "[place] find your legislators" https://www.ncleg.gov/findyourlegislators  Search internet for: "[place] insurance commissioner complaints" https://www.ncdoi.gov/contactscomplaints/assistance-or-file-complaint  Search internet for: "[place] Board of Pharmacy complaints" http://www.ncbop.org/contact.htm  Search internet for: "[facility] pharmacy complaints" Email [facility] Customer Relations https://www.[facility].com/help/email-customer-relations.jsp?callType=store  Search internet for: "[facility] pharmacy customer service complaints" https://www.[facility].com/topic/marketing/contactus/contactus_customerservice.jsp  ____________________________________________________________________________________________  ____________________________________________________________________________________________  Medication Rules  Purpose: To inform patients, and their family members, of our rules and regulations.  Applies to: All patients receiving prescriptions (written or electronic).  Pharmacy of record: Pharmacy where electronic prescriptions will be sent. If written prescriptions are taken to a different pharmacy, please inform the nursing staff. The pharmacy listed in the electronic medical record should be the one where you would like electronic prescriptions to be sent.  Electronic prescriptions: In compliance with the [place] [employer]rengthen Opioid Misuse Prevention (STOP) Act of 2017 (Session Law 2017-74/H243), effective March 16, 2018, all controlled substances must be electronically prescribed. Calling prescriptions  to the pharmacy will cease to exist.  Prescription refills: Only during scheduled appointments. Applies to all prescriptions.  NOTE: The following applies primarily to controlled substances (Opioid* Pain Medications).     Type of encounter (visit): For patients receiving controlled substances, face-to-face visits are required. (Not an option or up to the patient.)  Patient's responsibilities: Pain Pills: Bring all pain pills to every appointment (except for procedure appointments). Pill Bottles: Bring pills in original pharmacy bottle. Always bring the newest bottle. Bring bottle, even if empty. Medication refills: You are responsible for knowing and keeping track of what medications you take and those you need refilled. The day before your appointment: write a list of all prescriptions that need to be refilled. The day of the appointment: give the list to the admitting nurse. Prescriptions will be written only during appointments. No prescriptions will be written on procedure days. If you forget a medication: it will not be "Called in", "Faxed", or "electronically sent". You will need to get another appointment to get these prescribed. No early refills. Do not call asking to have your prescription filled early. Prescription Accuracy: You are responsible for carefully inspecting your prescriptions before leaving our office. Have the discharge nurse carefully go over each prescription with you, before taking them home. Make sure that your name is accurately spelled, that your address is correct. Check the name and dose of your medication to make sure it is accurate. Check the number of pills, and the written instructions to make sure they are clear and accurate. Make sure that you are given enough medication to last until your next medication refill appointment. Taking Medication: Take medication as prescribed. When it comes to controlled substances, taking less pills or less frequently than prescribed  is permitted and encouraged. Never take more pills than instructed. Never take medication more frequently than prescribed.  Inform other Doctors: Always inform, all of your healthcare providers, of all the medications you take. Pain Medication from other Providers: You are not allowed to accept any additional pain medication from any other Doctor or Healthcare provider. There are two exceptions to this rule. (see below) In the event that you require additional pain medication, you are responsible for notifying us, as stated below. Cough Medicine: Often these contain an opioid, such as codeine or hydrocodone. Never accept or take cough medicine containing these opioids if you are already taking an opioid* medication. The combination may cause respiratory failure and death. Medication Agreement: You are responsible for carefully reading and following our Medication Agreement. This must be signed before receiving any prescriptions from our practice. Safely store a copy of your signed Agreement. Violations to the Agreement will result in no further prescriptions. (Additional copies of our Medication Agreement are available upon request.) Laws, Rules, & Regulations: All patients are expected to follow all Federal and State Laws, Statutes, Rules, & Regulations. Ignorance of the Laws does not constitute a valid excuse.  Illegal drugs and Controlled Substances: The use of illegal substances (including, but not limited to marijuana and its derivatives) and/or the illegal use of any controlled substances is strictly prohibited. Violation of this rule may result in the immediate and permanent discontinuation of any and all prescriptions being written by our practice. The use of any illegal substances is prohibited. Adopted CDC guidelines & recommendations: Target dosing levels will be at or below 60 MME/day. Use of benzodiazepines** is not recommended.  Exceptions: There are only two exceptions to the rule of not  receiving pain medications from other Healthcare Providers. Exception #1 (Emergencies): In the event of an emergency (i.e.: accident requiring emergency care), you are allowed to receive additional pain medication. However, you are responsible for: As soon as   you are able, call our office [phone], at any time of the day or night, and leave a message stating your name, the date and nature of the emergency, and the name and dose of the medication prescribed. In the event that your call is answered by a member of our staff, make sure to document and save the date, time, and the name of the person that took your information.  Exception #2 (Planned Surgery): In the event that you are scheduled by another doctor or dentist to have any type of surgery or procedure, you are allowed (for a period no longer than 30 days), to receive additional pain medication, for the acute post-op pain. However, in this case, you are responsible for picking up a copy of our "Post-op Pain Management for Surgeons" handout, and giving it to your surgeon or dentist. This document is available at our office, and does not require an appointment to obtain it. Simply go to our office during business hours (Monday-Thursday from 8:00 AM to 4:00 PM) (Friday 8:00 AM to 12:00 Noon) or if you have a scheduled appointment with us, prior to your surgery, and ask for it by name. In addition, you are responsible for: calling our office [phone], at any time of the day or night, and leaving a message stating your name, name of your surgeon, type of surgery, and date of procedure or surgery. Failure to comply with your responsibilities may result in termination of therapy involving the controlled substances. Medication Agreement Violation. Following the above rules, including your responsibilities will help you in avoiding a Medication Agreement Violation ("Breaking your Pain Medication Contract").  *Opioid medications include: morphine,  codeine, oxycodone, oxymorphone, hydrocodone, hydromorphone, meperidine, tramadol, tapentadol, buprenorphine, fentanyl, methadone. **Benzodiazepine medications include: diazepam (Valium), alprazolam (Xanax), clonazepam (Klonopine), lorazepam (Ativan), clorazepate (Tranxene), chlordiazepoxide (Librium), estazolam (Prosom), oxazepam (Serax), temazepam (Restoril), triazolam (Halcion) (Last updated: 12/11/2020) ____________________________________________________________________________________________  ____________________________________________________________________________________________  Medication Recommendations and Reminders  Applies to: All patients receiving prescriptions (written and/or electronic).  Medication Rules & Regulations: These rules and regulations exist for your safety and that of others. They are not flexible and neither are we. Dismissing or ignoring them will be considered "non-compliance" with medication therapy, resulting in complete and irreversible termination of such therapy. (See document titled "Medication Rules" for more details.) In all conscience, because of safety reasons, we cannot continue providing a therapy where the patient does not follow instructions.  Pharmacy of record:  Definition: This is the pharmacy where your electronic prescriptions will be sent.  We do not endorse any particular pharmacy, however, we have experienced problems with [facility] not securing enough medication supply for the community. We do not restrict you in your choice of pharmacy. However, once we write for your prescriptions, we will NOT be re-sending more prescriptions to fix restricted supply problems created by your pharmacy, or your insurance.  The pharmacy listed in the electronic medical record should be the one where you want electronic prescriptions to be sent. If you choose to change pharmacy, simply notify our nursing staff.  Recommendations: Keep all of your pain  medications in a safe place, under lock and key, even if you live alone. We will NOT replace lost, stolen, or damaged medication. After you fill your prescription, take 1 week's worth of pills and put them away in a safe place. You should keep a separate, properly labeled bottle for this purpose. The remainder should be kept in the original bottle. Use this as your primary supply, until   it runs out. Once it's gone, then you know that you have 1 week's worth of medicine, and it is time to come in for a prescription refill. If you do this correctly, it is unlikely that you will ever run out of medicine. To make sure that the above recommendation works, it is very important that you make sure your medication refill appointments are scheduled at least 1 week before you run out of medicine. To do this in an effective manner, make sure that you do not leave the office without scheduling your next medication management appointment. Always ask the nursing staff to show you in your prescription , when your medication will be running out. Then arrange for the receptionist to get you a return appointment, at least 7 days before you run out of medicine. Do not wait until you have 1 or 2 pills left, to come in. This is very poor planning and does not take into consideration that we may need to cancel appointments due to bad weather, sickness, or emergencies affecting our staff. DO NOT ACCEPT A "Partial Fill": If for any reason your pharmacy does not have enough pills/tablets to completely fill or refill your prescription, do not allow for a "partial fill". The law allows the pharmacy to complete that prescription within 72 hours, without requiring a new prescription. If they do not fill the rest of your prescription within those 72 hours, you will need a separate prescription to fill the remaining amount, which we will NOT provide. If the reason for the partial fill is your insurance, you will need to talk to the pharmacist  about payment alternatives for the remaining tablets, but again, DO NOT ACCEPT A PARTIAL FILL, unless you can trust your pharmacist to obtain the remainder of the pills within 72 hours.  Prescription refills and/or changes in medication(s):  Prescription refills, and/or changes in dose or medication, will be conducted only during scheduled medication management appointments. (Applies to both, written and electronic prescriptions.) No refills on procedure days. No medication will be changed or started on procedure days. No changes, adjustments, and/or refills will be conducted on a procedure day. Doing so will interfere with the diagnostic portion of the procedure. No phone refills. No medications will be "called into the pharmacy". No Fax refills. No weekend refills. No Holliday refills. No after hours refills.  Remember:  Business hours are:  Monday to Thursday 8:00 AM to 4:00 PM Provider's Schedule: Maguadalupe Lata, MD - Appointments are:  Medication management: Monday and Wednesday 8:00 AM to 4:00 PM Procedure day: Tuesday and Thursday 7:30 AM to 4:00 PM Bilal Lateef, MD - Appointments are:  Medication management: Tuesday and Thursday 8:00 AM to 4:00 PM Procedure day: Monday and Wednesday 7:30 AM to 4:00 PM (Last update: 10/04/2019) ____________________________________________________________________________________________  ____________________________________________________________________________________________  CBD (cannabidiol) & Delta-8 (Delta-8 tetrahydrocannabinol) WARNING  Intro: Cannabidiol (CBD) and tetrahydrocannabinol (THC), are two natural compounds found in plants of the Cannabis genus. They can both be extracted from hemp or cannabis. Hemp and cannabis come from the Cannabis sativa plant. Both compounds interact with your body's endocannabinoid system, but they have very different effects. CBD does not produce the high sensation associated with cannabis. Delta-8  tetrahydrocannabinol, also known as delta-8 THC, is a psychoactive substance found in the Cannabis sativa plant, of which marijuana and hemp are two varieties. THC is responsible for the high associated with the illicit use of marijuana.  Applicable to: All individuals currently taking or considering taking CBD (cannabidiol) and,   more important, all patients taking opioid analgesic controlled substances (pain medication). (Example: oxycodone; oxymorphone; hydrocodone; hydromorphone; morphine; methadone; tramadol; tapentadol; fentanyl; buprenorphine; butorphanol; dextromethorphan; meperidine; codeine; etc.)  Legal status: CBD remains a Schedule I drug prohibited for any use. CBD is illegal with one exception. In the [place], CBD has a limited Food and Drug Administration (FDA) approval for the treatment of two specific types of epilepsy disorders. Only one CBD product has been approved by the FDA for this purpose: "Epidiolex". FDA is aware that some companies are marketing products containing cannabis and cannabis-derived compounds in ways that violate the Federal Food, Drug and Cosmetic Act (FD&C Act) and that may put the health and safety of consumers at risk. The FDA, a Federal agency, has not enforced the CBD status since 2018. UPDATE: (05/02/2021) The Drug Enforcement Agency (DEA) issued a letter stating that "delta" cannabinoids, including Delta-8-THCO and Delta-9-THCO, synthetically derived from hemp do not qualify as hemp and will be viewed as Schedule I drugs. (Schedule I drugs, substances, or chemicals are defined as drugs with no currently accepted medical use and a high potential for abuse. Some examples of Schedule I drugs are: heroin, lysergic acid diethylamide (LSD), marijuana (cannabis), 3,4-methylenedioxymethamphetamine (ecstasy), methaqualone, and peyote.) (https://www.dea.gov)  Legality: Some manufacturers ship CBD products nationally, which is illegal. Often such products are sold  online and are therefore available throughout the country. CBD is openly sold in head shops and health food stores in some states where such sales have not been explicitly legalized. Selling unapproved products with unsubstantiated therapeutic claims is not only a violation of the law, but also can put patients at risk, as these products have not been proven to be safe or effective. Federal illegality makes it difficult to conduct research on CBD.  Reference: "FDA Regulation of Cannabis and Cannabis-Derived Products, Including Cannabidiol (CBD)" - https://www.fda.gov/news-events/public-health-focus/fda-regulation-cannabis-and-cannabis-derived-products-including-cannabidiol-cbd  Warning: CBD is not FDA approved and has not undergo the same manufacturing controls as prescription drugs.  This means that the purity and safety of available CBD may be questionable. Most of the time, despite manufacturer's claims, it is contaminated with THC (delta-9-tetrahydrocannabinol - the chemical in marijuana responsible for the "HIGH").  When this is the case, the THC contaminant will trigger a positive urine drug screen (UDS) test for Marijuana (carboxy-THC). Because a positive UDS for any illicit substance is a violation of our medication agreement, your opioid analgesics (pain medicine) may be permanently discontinued. The FDA recently put out a warning about 5 things that everyone should be aware of regarding Delta-8 THC: Delta-8 THC products have not been evaluated or approved by the FDA for safe use and may be marketed in ways that put the public health at risk. The FDA has received adverse event reports involving delta-8 THC-containing products. Delta-8 THC has psychoactive and intoxicating effects. Delta-8 THC manufacturing often involve use of potentially harmful chemicals to create the concentrations of delta-8 THC claimed in the marketplace. The final delta-8 THC product may have potentially harmful by-products  (contaminants) due to the chemicals used in the process. Manufacturing of delta-8 THC products may occur in uncontrolled or unsanitary settings, which may lead to the presence of unsafe contaminants or other potentially harmful substances. Delta-8 THC products should be kept out of the reach of children and pets.  MORE ABOUT CBD  General Information: CBD was discovered in 1940 and it is a derivative of the cannabis sativa genus plants (Marijuana and Hemp). It is one of the 113 identified substances found in Marijuana.   It accounts for up to 40% of the plant's extract. As of 2018, preliminary clinical studies on CBD included research for the treatment of anxiety, movement disorders, and pain. CBD is available and consumed in multiple forms, including inhalation of smoke or vapor, as an aerosol spray, and by mouth. It may be supplied as an oil containing CBD, capsules, dried cannabis, or as a liquid solution. CBD is thought not to be as psychoactive as THC (delta-9-tetrahydrocannabinol - the chemical in marijuana responsible for the "HIGH"). Studies suggest that CBD may interact with different biological target receptors in the body, including cannabinoid and other neurotransmitter receptors. As of 2018 the mechanism of action for its biological effects has not been determined.  Side-effects  Adverse reactions: Dry mouth, diarrhea, decreased appetite, fatigue, drowsiness, malaise, weakness, sleep disturbances, and others.  Drug interactions: CBC may interact with other medications such as blood-thinners. Because CBD causes drowsiness on its own, it also increases the drowsiness caused by other medications, including antihistamines (such as Benadryl), benzodiazepines (Xanax, Ativan, Valium), antipsychotics, antidepressants and opioids, as well as alcohol and supplements such as kava, melatonin and St. John's Wort. Be cautious with the following combinations:   Brivaracetam (Briviact) Brivaracetam is changed  and broken down by the body. CBD might decrease how quickly the body breaks down brivaracetam. This might increase levels of brivaracetam in the body.  Caffeine Caffeine is changed and broken down by the body. CBD might decrease how quickly the body breaks down caffeine. This might increase levels of caffeine in the body.  Carbamazepine (Tegretol) Carbamazepine is changed and broken down by the body. CBD might decrease how quickly the body breaks down carbamazepine. This might increase levels of carbamazepine in the body and increase its side effects.  Citalopram (Celexa) Citalopram is changed and broken down by the body. CBD might decrease how quickly the body breaks down citalopram. This might increase levels of citalopram in the body and increase its side effects.  Clobazam (Onfi) Clobazam is changed and broken down by the liver. CBD might decrease how quickly the liver breaks down clobazam. This might increase the effects and side effects of clobazam.  Eslicarbazepine (Aptiom) Eslicarbazepine is changed and broken down by the body. CBD might decrease how quickly the body breaks down eslicarbazepine. This might increase levels of eslicarbazepine in the body by a small amount.  Everolimus (Zostress) Everolimus is changed and broken down by the body. CBD might decrease how quickly the body breaks down everolimus. This might increase levels of everolimus in the body.  Lithium Taking higher doses of CBD might increase levels of lithium. This can increase the risk of lithium toxicity.  Medications changed by the liver (Cytochrome P450 1A1 (CYP1A1) substrates) Some medications are changed and broken down by the liver. CBD might change how quickly the liver breaks down these medications. This could change the effects and side effects of these medications.  Medications changed by the liver (Cytochrome P450 1A2 (CYP1A2) substrates) Some medications are changed and broken down by the liver. CBD  might change how quickly the liver breaks down these medications. This could change the effects and side effects of these medications.  Medications changed by the liver (Cytochrome P450 1B1 (CYP1B1) substrates) Some medications are changed and broken down by the liver. CBD might change how quickly the liver breaks down these medications. This could change the effects and side effects of these medications.  Medications changed by the liver (Cytochrome P450 2A6 (CYP2A6) substrates) Some medications are changed and   broken down by the liver. CBD might change how quickly the liver breaks down these medications. This could change the effects and side effects of these medications.  Medications changed by the liver (Cytochrome P450 2B6 (CYP2B6) substrates) Some medications are changed and broken down by the liver. CBD might change how quickly the liver breaks down these medications. This could change the effects and side effects of these medications.  Medications changed by the liver (Cytochrome P450 2C19 (CYP2C19) substrates) Some medications are changed and broken down by the liver. CBD might change how quickly the liver breaks down these medications. This could change the effects and side effects of these medications.  Medications changed by the liver (Cytochrome P450 2C8 (CYP2C8) substrates) Some medications are changed and broken down by the liver. CBD might change how quickly the liver breaks down these medications. This could change the effects and side effects of these medications.  Medications changed by the liver (Cytochrome P450 2C9 (CYP2C9) substrates) Some medications are changed and broken down by the liver. CBD might change how quickly the liver breaks down these medications. This could change the effects and side effects of these medications.  Medications changed by the liver (Cytochrome P450 2D6 (CYP2D6) substrates) Some medications are changed and broken down by the liver. CBD might  change how quickly the liver breaks down these medications. This could change the effects and side effects of these medications.  Medications changed by the liver (Cytochrome P450 2E1 (CYP2E1) substrates) Some medications are changed and broken down by the liver. CBD might change how quickly the liver breaks down these medications. This could change the effects and side effects of these medications.  Medications changed by the liver (Cytochrome P450 3A4 (CYP3A4) substrates) Some medications are changed and broken down by the liver. CBD might change how quickly the liver breaks down these medications. This could change the effects and side effects of these medications.  Medications changed by the liver (Glucuronidated drugs) Some medications are changed and broken down by the liver. CBD might change how quickly the liver breaks down these medications. This could change the effects and side effects of these medications.  Medications that decrease the breakdown of other medications by the liver (Cytochrome P450 2C19 (CYP2C19) inhibitors) CBD is changed and broken down by the liver. Some drugs decrease how quickly the liver changes and breaks down CBD. This could change the effects and side effects of CBD.  Medications that decrease the breakdown of other medications in the liver (Cytochrome P450 3A4 (CYP3A4) inhibitors) CBD is changed and broken down by the liver. Some drugs decrease how quickly the liver changes and breaks down CBD. This could change the effects and side effects of CBD.  Medications that increase breakdown of other medications by the liver (Cytochrome P450 3A4 (CYP3A4) inducers) CBD is changed and broken down by the liver. Some drugs increase how quickly the liver changes and breaks down CBD. This could change the effects and side effects of CBD.  Medications that increase the breakdown of other medications by the liver (Cytochrome P450 2C19 (CYP2C19) inducers) CBD is changed and  broken down by the liver. Some drugs increase how quickly the liver changes and breaks down CBD. This could change the effects and side effects of CBD.  Methadone (Dolophine) Methadone is broken down by the liver. CBD might decrease how quickly the liver breaks down methadone. Taking cannabidiol along with methadone might increase the effects and side effects of methadone.  Rufinamide (Banzel) Rufinamide is   changed and broken down by the body. CBD might decrease how quickly the body breaks down rufinamide. This might increase levels of rufinamide in the body by a small amount.  Sedative medications (CNS depressants) CBD might cause sleepiness and slowed breathing. Some medications, called sedatives, can also cause sleepiness and slowed breathing. Taking CBD with sedative medications might cause breathing problems and/or too much sleepiness.  Sirolimus (Rapamune) Sirolimus is changed and broken down by the body. CBD might decrease how quickly the body breaks down sirolimus. This might increase levels of sirolimus in the body.  Stiripentol (Diacomit) Stiripentol is changed and broken down by the body. CBD might decrease how quickly the body breaks down stiripentol. This might increase levels of stiripentol in the body and increase its side effects.  Tacrolimus (Prograf) Tacrolimus is changed and broken down by the body. CBD might decrease how quickly the body breaks down tacrolimus. This might increase levels of tacrolimus in the body.  Tamoxifen (Soltamox) Tamoxifen is changed and broken down by the body. CBD might affect how quickly the body breaks down tamoxifen. This might affect levels of tamoxifen in the body.  Topiramate (Topamax) Topiramate is changed and broken down by the body. CBD might decrease how quickly the body breaks down topiramate. This might increase levels of topiramate in the body by a small amount.  Valproate Valproic acid can cause liver injury. Taking cannabidiol  with valproic acid might increase the chance of liver injury. CBD and/or valproic acid might need to be stopped, or the dose might need to be reduced.  Warfarin (Coumadin) CBD might increase levels of warfarin, which can increase the risk for bleeding. CBD and/or warfarin might need to be stopped, or the dose might need to be reduced.  Zonisamide Zonisamide is changed and broken down by the body. CBD might decrease how quickly the body breaks down zonisamide. This might increase levels of zonisamide in the body by a small amount. (Last update: 05/14/2021) ____________________________________________________________________________________________  ____________________________________________________________________________________________  Drug Holidays (Slow)  What is a "Drug Holiday"? Drug Holiday: is the name given to the period of time during which a patient stops taking a medication(s) for the purpose of eliminating tolerance to the drug.  Benefits Improved effectiveness of opioids. Decreased opioid dose needed to achieve benefits. Improved pain with lesser dose.  What is tolerance? Tolerance: is the progressive decreased in effectiveness of a drug due to its repetitive use. With repetitive use, the body gets use to the medication and as a consequence, it loses its effectiveness. This is a common problem seen with opioid pain medications. As a result, a larger dose of the drug is needed to achieve the same effect that used to be obtained with a smaller dose.  How long should a "Drug Holiday" last? You should stay off of the pain medicine for at least 14 consecutive days. (2 weeks)  Should I stop the medicine "cold turkey"? No. You should always coordinate with your Pain Specialist so that he/she can provide you with the correct medication dose to make the transition as smoothly as possible.  How do I stop the medicine? Slowly. You will be instructed to decrease the daily amount of  pills that you take by one (1) pill every seven (7) days. This is called a "slow downward taper" of your dose. For example: if you normally take four (4) pills per day, you will be asked to drop this dose to three (3) pills per day for seven (7) days, then to   two (2) pills per day for seven (7) days, then to one (1) per day for seven (7) days, and at the end of those last seven (7) days, this is when the "Drug Holiday" would start.   Will I have withdrawals? By doing a "slow downward taper" like this one, it is unlikely that you will experience any significant withdrawal symptoms. Typically, what triggers withdrawals is the sudden stop of a high dose opioid therapy. Withdrawals can usually be avoided by slowly decreasing the dose over a prolonged period of time. If you do not follow these instructions and decide to stop your medication abruptly, withdrawals may be possible.  What are withdrawals? Withdrawals: refers to the wide range of symptoms that occur after stopping or dramatically reducing opiate drugs after heavy and prolonged use. Withdrawal symptoms do not occur to patients that use low dose opioids, or those who take the medication sporadically. Contrary to benzodiazepine (example: Valium, Xanax, etc.) or alcohol withdrawals ("Delirium Tremens"), opioid withdrawals are not lethal. Withdrawals are the physical manifestation of the body getting rid of the excess receptors.  Expected Symptoms Early symptoms of withdrawal may include: Agitation Anxiety Muscle aches Increased tearing Insomnia Runny nose Sweating Yawning  Late symptoms of withdrawal may include: Abdominal cramping Diarrhea Dilated pupils Goose bumps Nausea Vomiting  Will I experience withdrawals? Due to the slow nature of the taper, it is very unlikely that you will experience any.  What is a slow taper? Taper: refers to the gradual decrease in dose.  (Last update:  10/04/2019) ____________________________________________________________________________________________   ______________________________________________________________________  Preparing for Procedure with Sedation  NOTICE: Due to recent regulatory changes, starting on October 14, 2020, procedures requiring intravenous (IV) sedation will no longer be performed at the [facility].  These types of procedures are required to be performed at [facility] ambulatory surgery facility.  We are very sorry for the inconvenience.  Procedure appointments are limited to planned procedures: No Prescription Refills. No disability issues will be discussed. No medication changes will be discussed.  Instructions: Oral Intake: Do not eat or drink anything for at least 8 hours prior to your procedure. (Exception: Blood Pressure Medication. See below.) Transportation: A driver is required. You may not drive yourself after the procedure. Blood Pressure Medicine: Do not forget to take your blood pressure medicine with a sip of water the morning of the procedure. If your Diastolic (lower reading) is above 100 mmHg, elective cases will be cancelled/rescheduled. Blood thinners: These will need to be stopped for procedures. Notify our staff if you are taking any blood thinners. Depending on which one you take, there will be specific instructions on how and when to stop it. Diabetics on insulin: Notify the staff so that you can be scheduled 1st case in the morning. If your diabetes requires high dose insulin, take only  of your normal insulin dose the morning of the procedure and notify the staff that you have done so. Preventing infections: Shower with an antibacterial soap the morning of your procedure. Build-up your immune system: Take 1000 mg of Vitamin C with every meal (3 times a day) the day prior to your procedure. Antibiotics: Inform the staff if you have a condition or reason that requires you to take  antibiotics before dental procedures. Pregnancy: If you are pregnant, call and cancel the procedure. Sickness: If you have a cold, fever, or any active infections, call and cancel the procedure. Arrival: You must be in the facility at least 30 minutes prior to  your scheduled procedure. Children: Do not bring children with you. Dress appropriately: There is always the possibility that your clothing may get soiled. Valuables: Do not bring any jewelry or valuables.  Reasons to call and reschedule or cancel your procedure: (Following these recommendations will minimize the risk of a serious complication.) Surgeries: Avoid having procedures within 2 weeks of any surgery. (Avoid for 2 weeks before or after any surgery). Flu Shots: Avoid having procedures within 2 weeks of a flu shots. (Avoid for 2 weeks before or after immunizations). Barium: Avoid having a procedure within 7-10 days after having had a radiological study involving the use of radiological contrast. (Myelograms, Barium swallow or enema study). Heart attacks: Avoid any elective procedures or surgeries for the initial 6 months after a "Myocardial Infarction" (Heart Attack). Blood thinners: It is imperative that you stop these medications before procedures. Let us know if you if you take any blood thinner.  Infection: Avoid procedures during or within two weeks of an infection (including chest colds or gastrointestinal problems). Symptoms associated with infections include: Localized redness, fever, chills, night sweats or profuse sweating, burning sensation when voiding, cough, congestion, stuffiness, runny nose, sore throat, diarrhea, nausea, vomiting, cold or Flu symptoms, recent or current infections. It is specially important if the infection is over the area that we intend to treat. Heart and lung problems: Symptoms that may suggest an active cardiopulmonary problem include: cough, chest pain, breathing difficulties or shortness of breath,  dizziness, ankle swelling, uncontrolled high or unusually low blood pressure, and/or palpitations. If you are experiencing any of these symptoms, cancel your procedure and contact your primary care physician for an evaluation.  Remember:  Regular Business hours are:  Monday to Thursday 8:00 AM to 4:00 PM  Provider's Schedule: Milinda Pointer, MD:  Procedure days: Tuesday and Thursday 7:30 AM to 4:00 PM  Gillis Santa, MD:  Procedure days: Monday and Wednesday 7:30 AM to 4:00 PM ______________________________________________________________________  ____________________________________________________________________________________________  General Risks and Possible Complications  Patient Responsibilities: It is important that you read this as it is part of your informed consent. It is our duty to inform you of the risks and possible complications associated with treatments offered to you. It is your responsibility as a patient to read this and to ask questions about anything that is not clear or that you believe was not covered in this document.  Patient's Rights: You have the right to refuse treatment. You also have the right to change your mind, even after initially having agreed to have the treatment done. However, under this last option, if you wait until the last second to change your mind, you may be charged for the materials used up to that point.  Introduction: Medicine is not an Chief Strategy Officer. Everything in Medicine, including the lack of treatment(s), carries the potential for danger, harm, or loss (which is by definition: Risk). In Medicine, a complication is a secondary problem, condition, or disease that can aggravate an already existing one. All treatments carry the risk of possible complications. The fact that a side effects or complications occurs, does not imply that the treatment was conducted incorrectly. It must be clearly understood that these can happen even when  everything is done following the highest safety standards.  No treatment: You can choose not to proceed with the proposed treatment alternative. The "PRO(s)" would include: avoiding the risk of complications associated with the therapy. The "CON(s)" would include: not getting any of the treatment benefits. These benefits fall under  one of three categories: diagnostic; therapeutic; and/or palliative. Diagnostic benefits include: getting information which can ultimately lead to improvement of the disease or symptom(s). Therapeutic benefits are those associated with the successful treatment of the disease. Finally, palliative benefits are those related to the decrease of the primary symptoms, without necessarily curing the condition (example: decreasing the pain from a flare-up of a chronic condition, such as incurable terminal cancer).  General Risks and Complications: These are associated to most interventional treatments. They can occur alone, or in combination. They fall under one of the following six (6) categories: no benefit or worsening of symptoms; bleeding; infection; nerve damage; allergic reactions; and/or death. No benefits or worsening of symptoms: In Medicine there are no guarantees, only probabilities. No healthcare provider can ever guarantee that a medical treatment will work, they can only state the probability that it may. Furthermore, there is always the possibility that the condition may worsen, either directly, or indirectly, as a consequence of the treatment. Bleeding: This is more common if the patient is taking a blood thinner, either prescription or over the counter (example: Goody Powders, Fish oil, Aspirin, Garlic, etc.), or if suffering a condition associated with impaired coagulation (example: Hemophilia, cirrhosis of the liver, low platelet counts, etc.). However, even if you do not have one on these, it can still happen. If you have any of these conditions, or take one of these  drugs, make sure to notify your treating physician. Infection: This is more common in patients with a compromised immune system, either due to disease (example: diabetes, cancer, human immunodeficiency virus [HIV], etc.), or due to medications or treatments (example: therapies used to treat cancer and rheumatological diseases). However, even if you do not have one on these, it can still happen. If you have any of these conditions, or take one of these drugs, make sure to notify your treating physician. Nerve Damage: This is more common when the treatment is an invasive one, but it can also happen with the use of medications, such as those used in the treatment of cancer. The damage can occur to small secondary nerves, or to large primary ones, such as those in the spinal cord and brain. This damage may be temporary or permanent and it may lead to impairments that can range from temporary numbness to permanent paralysis and/or brain death. Allergic Reactions: Any time a substance or material comes in contact with our body, there is the possibility of an allergic reaction. These can range from a mild skin rash (contact dermatitis) to a severe systemic reaction (anaphylactic reaction), which can result in death. Death: In general, any medical intervention can result in death, most of the time due to an unforeseen complication. ____________________________________________________________________________________________ Pain Management Discharge Instructions  General Discharge Instructions :  If you need to reach your doctor call: Monday-Friday 8:00 am - 4:00 pm at [phone] or toll free [phone].  After clinic hours [phone] to have operator reach doctor.  Bring all of your medication bottles to all your appointments in the pain clinic.  To cancel or reschedule your appointment with Pain Management please remember to call 24 hours in advance to avoid a fee.  Refer to the educational materials  which you have been given on: General Risks, I had my Procedure. Discharge Instructions, Post Sedation.  Post Procedure Instructions:  The drugs you were given will stay in your system until tomorrow, so for the next 24 hours you should not drive, make any legal decisions or drink any  alcoholic beverages.  You may eat anything you prefer, but it is better to start with liquids then soups and crackers, and gradually work up to solid foods.  Please notify your doctor immediately if you have any unusual bleeding, trouble breathing or pain that is not related to your normal pain.  Depending on the type of procedure that was done, some parts of your body may feel week and/or numb.  This usually clears up by tonight or the next day.  Walk with the use of an assistive device or accompanied by an adult for the 24 hours.  You may use ice on the affected area for the first 24 hours.  Put ice in a Ziploc bag and cover with a towel and place against area 15 minutes on 15 minutes off.  You may switch to heat after 24 hours.Selective Nerve Root Block Patient Information  Description: Specific nerve roots exit the spinal canal and these nerves can be compressed and inflamed by a bulging disc and bone spurs.  By injecting steroids on the nerve root, we can potentially decrease the inflammation surrounding these nerves, which often leads to decreased pain.  Also, by injecting local anesthesia on the nerve root, this can provide [place] helpful information to give to your referring doctor if it decreases your pain.  Selective nerve root blocks can be done along the spine from the neck to the low back depending on the location of your pain.   After numbing the skin with local anesthesia, a small needle is passed to the nerve root and the position of the needle is verified using x-ray pictures.  After the needle is in correct position, we then deposit the medication.  You may experience a pressure sensation while this is  being done.  The entire block usually lasts less than 15 minutes.  Conditions that may be treated with selective nerve root blocks: Low back and leg pain Spinal stenosis Diagnostic block prior to potential surgery Neck and arm pain Post laminectomy syndrome  Preparation for the injection:  Do not eat any solid food or dairy products within 8 hours of your appointment. You may drink clear liquids up to 3 hours before an appointment.  Clear liquids include water, black coffee, juice or soda.  No milk or cream please. You may take your regular medications, including pain medications, with a sip of water before your appointment.  Diabetics should hold regular insulin (if taken separately) and take 1/2 normal NPH dose the morning of the procedure.  Carry some sugar containing items with you to your appointment. A driver must accompany you and be prepared to drive you home after your procedure. Bring all your current medications with you. An IV may be inserted and sedation may be given at the discretion of the physician. A blood pressure cuff, EKG, and other monitors will often be applied during the procedure.  Some patients may need to have extra oxygen administered for a short period. You will be asked to provide medical information, including allergies, prior to the procedure.  We must know immediately if you are taking blood  Thinners (like Coumadin) or if you are allergic to IV iodine contrast (dye).  Possible side-effects: All are usually temporary Bleeding from needle site Light headedness Numbness and tingling Decreased blood pressure Weakness in arms/legs Pressure sensation in back/neck Pain at injection site (several days)  Possible complications: All are extremely rare Infection Nerve injury Spinal headache (a headache wore with upright position)  Call  if you experience: Fever/chills associated with headache or increased back/neck pain Headache worsened by an upright  position New onset weakness or numbness of an extremity below the injection site Hives or difficulty breathing (go to the emergency room) Inflammation or drainage at the injection site(s) Severe back/neck pain greater than usual New symptoms which are concerning to you  Please note:  Although the local anesthetic injected can often make your back or neck feel good for several hours after the injection the pain will likely return.  It takes 3-5 days for steroids to work on the nerve root. You may not notice any pain relief for at least one week.  If effective, we will often do a series of 3 injections spaced 3-6 weeks apart to maximally decrease your pain.    If you have any questions, please call [phone] [facility]  Post-op Pain Management on a Chronic Pain Patient  Why should the surgeon manage his patient's post-op pain? The Surgeon is uniquely qualified to determine the amount of post-operative pain to be expected on a procedure or surgery that he/she has performed. Even with similar surgeries, the surgeon's perspective on expected pain is unique, since he/she performed the procedure and knows the degree of difficulty and/or tissue damage involved in each particular case. The surgeon is also up to date on events such as blood loss, intraoperative complications, and PO (per orum = Oral) status that may influence not only the patient's dose and schedule, but route of administration as well.  How about telling chronic pain patients to just double up or increase their usual pain medication intake to compensate for the increased pain? ABSOLUTELY NOT. This is a bad idea since it will lead to several problems: The patient running out of his/her usual medications early and this may create a problem at the level of the insurance, which allows supplies of medications based on the amount and schedule stated on the prescription. Running out early may trigger an  event where the refill is denied by the insurance company and/or pharmacy, as they will interpret it as an "early refill", and therefore deny it.  This is also a bad idea since it will throw off pill counts required during follow-up monitoring. This practice provides a very poor paper trail as to why this patient ran out of medication early. From the perspective of the pain physician, it creates a nightmare in the accounting of the patient's medication. In addition, it will cause the patient to run out of pills early. From the perspective of the patient, since we do not allow early refills, the patient will run out of medicine probably triggering withdrawals. Getting an early appointment at our pain facility may also prove to be difficult since available appointments are very difficult to come by.  So, what should I do when confronted with an upcomming surgery and I already take pain medicine on a regular basis?  This is what the surgeon should do: They should not change the dose or schedule of the pain medications prescribed by the pain specialist. This medication regimen allows for the patient's chronic pain to be under control, so as to bring that patient down to the level of an average individual. Have the patient continue their usual pain management regimen, without any alterations. In addition, manage the post-operative pain as they would on any other "narcotic naive" patient (patient that does not take medication). They should not attempt to compensate for tolerance. This is what  the patient's usual regimen will do for them. Simply treat the patient as if they had no chronic pain and as if they were not taking any other pain medications. They should talk to the patient about the pain medication, just like you would for anyone else. Do not assume that patients are experts in opioids. Make sure they let the patient know that the medication is to be used only if absolutely necessary. (PRN) Prescribe  the medication for as long as they usually on any other patient undergoing the same type of surgery (usually less than 30 days). They should not prescribe for longer than 30 days, unless there is a post-operative complication. They should send [place] a copy of the operative report with information about their choice of the post-op pain medication provided. They should keep [place] informed of any complications that may prolong the average duration patient's post-op pain.  If you have any questions, please feel to contact us at [phone]. _____________________________________________________________________________________________

## 2021-10-29 NOTE — Progress Notes (Signed)
Safety precautions to be maintained throughout the outpatient stay will include: orient to surroundings, keep bed in low position, maintain call bell within reach at all times, provide assistance with transfer out of bed and ambulation.  

## 2021-11-06 ENCOUNTER — Telehealth: Payer: Self-pay

## 2021-11-06 ENCOUNTER — Other Ambulatory Visit: Payer: Self-pay | Admitting: Urology

## 2021-11-06 ENCOUNTER — Encounter: Payer: Self-pay | Admitting: Nurse Practitioner

## 2021-11-06 ENCOUNTER — Ambulatory Visit
Admission: RE | Admit: 2021-11-06 | Discharge: 2021-11-06 | Disposition: A | Discharge status: Home / Self Care | Payer: HMO | Source: Ambulatory Visit | Attending: Surgery | Admitting: Surgery

## 2021-11-06 ENCOUNTER — Other Ambulatory Visit: Payer: Self-pay | Admitting: Surgery

## 2021-11-06 DIAGNOSIS — E349 Endocrine disorder, unspecified: Secondary | ICD-10-CM

## 2021-11-06 DIAGNOSIS — K449 Diaphragmatic hernia without obstruction or gangrene: Secondary | ICD-10-CM

## 2021-11-06 MED ORDER — PANTOPRAZOLE SODIUM 40 MG PO TBEC: 80.0000 mg | DELAYED_RELEASE_TABLET | Freq: Every day | ORAL | 1 refills | Status: DC

## 2021-11-06 NOTE — Telephone Encounter (Signed)
LVM for pt to call clinic for barium swallow results and to schedule f/u appt with Dr. Dahlia Byes.

## 2021-11-10 ENCOUNTER — Ambulatory Visit: Payer: Medicaid Other | Admitting: Nurse Practitioner

## 2021-11-10 ENCOUNTER — Ambulatory Visit (INDEPENDENT_AMBULATORY_CARE_PROVIDER_SITE_OTHER): Payer: HMO | Admitting: Surgery

## 2021-11-10 ENCOUNTER — Encounter: Payer: Self-pay | Admitting: Surgery

## 2021-11-10 ENCOUNTER — Other Ambulatory Visit: Payer: Self-pay | Admitting: Urology

## 2021-11-10 VITALS — BP 135/87 | HR 105 | Temp 98.3°F | Wt 248.2 lb

## 2021-11-10 DIAGNOSIS — K429 Umbilical hernia without obstruction or gangrene: Secondary | ICD-10-CM | POA: Diagnosis not present

## 2021-11-10 DIAGNOSIS — K449 Diaphragmatic hernia without obstruction or gangrene: Secondary | ICD-10-CM

## 2021-11-10 DIAGNOSIS — E349 Endocrine disorder, unspecified: Secondary | ICD-10-CM

## 2021-11-10 MED ORDER — TESTOSTERONE CYPIONATE 200 MG/ML IM SOLN
100.0000 mg | INTRAMUSCULAR | 0 refills | Status: DC
Start: 2021-11-10 — End: 2022-07-03

## 2021-11-10 NOTE — Patient Instructions (Addendum)
If you have any concerns or questions, please feel free to call our office. Please call our office to schedule a follow up once you know the date of your EGD.  Hiatal Hernia  A hiatal hernia occurs when part of the stomach slides above the muscle that separates the abdomen from the chest (diaphragm). A person can be born with a hiatal hernia (congenital), or it may develop over time. In almost all cases of hiatal hernia, only the top part of the stomach pushes through the diaphragm. Many people have a hiatal hernia with no symptoms. The larger the hernia, the more likely it is that you will have symptoms. In some cases, a hiatal hernia allows stomach acid to flow back into the tube that carries food from your mouth to your stomach (esophagus). This may cause heartburn symptoms. The development of heartburn symptoms may mean that you have a condition called gastroesophageal reflux disease (GERD). What are the causes? This condition is caused by a weakness in the opening (hiatus) where the esophagus passes through the diaphragm to attach to the upper part of the stomach. A person may be born with a weakness in the hiatus, or a weakness can develop over time. What increases the risk? This condition is more likely to develop in: Older people. Age is a major risk factor for a hiatal hernia, especially if you are over the age of 98. Pregnant women. People who are overweight. People who have frequent constipation. What are the signs or symptoms? Symptoms of this condition usually develop in the form of GERD symptoms. Symptoms include: Heartburn. Upset stomach (indigestion). Trouble swallowing. Coughing or wheezing. Wheezing is making high-pitched whistling sounds when you breathe. Sore throat. Chest pain. Nausea and vomiting. How is this diagnosed? This condition may be diagnosed during testing for GERD. Tests that may be done include: X-rays of your stomach or chest. An upper gastrointestinal  (GI) series. This is an X-ray exam of your GI tract that is taken after you swallow a chalky liquid that shows up clearly on the X-ray. Endoscopy. This is a procedure to look into your stomach using a thin, flexible tube that has a tiny camera and light on the end of it. How is this treated? This condition may be treated by: Dietary and lifestyle changes to help reduce GERD symptoms. Medicines. These may include: Over-the-counter antacids. Medicines that make your stomach empty more quickly. Medicines that block the production of stomach acid (H2 blockers). Stronger medicines to reduce stomach acid (proton pump inhibitors). Surgery to repair the hernia, if other treatments are not helping. If you have no symptoms, you may not need treatment. Follow these instructions at home: Lifestyle and activity Do not use any products that contain nicotine or tobacco. These products include cigarettes, chewing tobacco, and vaping devices, such as e-cigarettes. If you need help quitting, ask your health care provider. Try to achieve and maintain a healthy body weight. Avoid putting pressure on your abdomen. Anything that puts pressure on your abdomen increases the amount of acid that may be pushed up into your esophagus. Avoid bending over, especially after eating. Raise the head of your bed by putting blocks under the legs. This keeps your head and esophagus higher than your stomach. Do not wear tight clothing around your chest or stomach. Try not to strain when having a bowel movement, when urinating, or when lifting heavy objects. Eating and drinking Avoid foods that can worsen GERD symptoms. These may include: Fatty foods, like fried  foods. Citrus fruits, like oranges or lemon. Other foods and drinks that contain acid, like orange juice or tomatoes. Spicy food. Chocolate. Eat frequent small meals instead of three large meals a day. This helps prevent your stomach from getting too full. Eat  slowly. Do not lie down right after eating. Do not eat 1-2 hours before bed. Do not drink beverages with caffeine. These include cola, coffee, cocoa, and tea. Do not drink alcohol. General instructions Take over-the-counter and prescription medicines only as told by your health care provider. Keep all follow-up visits. Your health care provider will want to check that any new prescribed medicines are helping your symptoms. Contact a health care provider if: Your symptoms are not controlled with medicines or lifestyle changes. You are having trouble swallowing. You have coughing or wheezing that will not go away. Your pain is getting worse. Your pain spreads to your arms, neck, jaw, teeth, or back. You feel nauseous or you vomit. Get help right away if: You have shortness of breath. You vomit blood. You have bright red blood in your stools. You have black, tarry stools. These symptoms may be an emergency. Get help right away. Call 911. Do not wait to see if the symptoms will go away. Do not drive yourself to the hospital. Summary A hiatal hernia occurs when part of the stomach slides above the muscle that separates the abdomen from the chest. A person may be born with a weakness in the hiatus, or a weakness can develop over time. Symptoms of a hiatal hernia may include heartburn, trouble swallowing, or sore throat. Management of a hiatal hernia includes eating frequent small meals instead of three large meals a day. Get help right away if you vomit blood, have bright red blood in your stools, or have black, tarry stools. This information is not intended to replace advice given to you by your health care provider. Make sure you discuss any questions you have with your health care provider. Document Revised: 04/29/2021 Document Reviewed: 04/29/2021 Elsevier Patient Education  [place].

## 2021-11-11 ENCOUNTER — Telehealth: Payer: Self-pay

## 2021-11-11 NOTE — Telephone Encounter (Signed)
Pt approved for testosterone cypionate from 11/11/21 through 11/12/2022. Pt notified via mychart.

## 2021-11-11 NOTE — Progress Notes (Signed)
Outpatient Surgical Follow Up  11/11/2021  Darren Allen is an 56 y.o. male.   Chief Complaint  Patient presents with   Follow-up    Hiatal hernia    HPI: Darren Allen is a 56 y.o. male f/u for paraesophageal hernia.  He has a long history of COPD, dm and currently smokes daily between half a pack and a pack a day.  He has significant pulmonary issues with dyspnea on exertion.   Recently had a barium swallow that I have personally reviewed showing evidence of paraesophageal hernia with significant reflux.  There was no evidence of a stricture. Does have an upcoming egd the next few weeks.  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  There is also chronic pulmonary changes.  CBC and CMP is normal except mild elevation of the glucose. He Did have prior history of appendectomy as a child requiring laparotomy. He  had ankle fracture repair by Dr. Posey Pronto.  Past Medical History:  Diagnosis Date   Allergy    Calculus of kidney 02/04/2015   COPD (chronic obstructive pulmonary disease) (HCC)    Diabetes mellitus without complication ([place])    type 2   Hypertension    Neuropathy     Past Surgical History:  Procedure Laterality Date   APPENDECTOMY     INCISION AND DRAINAGE PERIRECTAL ABSCESS N/A 02/04/2015   Procedure: IRRIGATION AND DEBRIDEMENT PERIRECTAL ABSCESS;  Surgeon: Marlyce Huge, MD;  Location: [facility] ORS;  Service: General;  Laterality: N/A;   ORIF ANKLE FRACTURE Left 03/23/2017   Procedure: OPEN REDUCTION INTERNAL FIXATION (ORIF) ANKLE FRACTURE;  Surgeon: Leim Fabry, MD;  Location: [facility] ORS;  Service: Orthopedics;  Laterality: Left;   RECTAL EXAM UNDER ANESTHESIA  02/04/2015   Procedure: RECTAL EXAM UNDER ANESTHESIA;  Surgeon:  Marlyce Huge, MD;  Location: [facility] OR[facility];  Service: General;;   SYNDESMOSIS REPAIR Left 03/23/2017   Procedure: SYNDESMOSIS REPAIR;  Surgeon: Leim Fabry, MD;  Location: [facility] OR[facility];  Service: Orthopedics;  Laterality: Left;    Family History  Problem Relation Age of Onset   Cancer Mother 1       Lung   Cancer Father        Colon   Heart disease Father    Alcohol abuse Father    Cancer Brother 68       Esophageal   Diabetes Brother    Heart disease Brother     Social History:  reports that he has been smoking cigarettes. He has a 34.00 pack-year smoking history. He has never used smokeless tobacco. He reports that he does not currently use alcohol after a past usage of about 12.0 standard drinks of alcohol per week. He reports that he does not use drugs.  Allergies:  Allergies  Allergen Reactions   Glipizide Other (See Comments) and Palpitations    Shaky, feel bad Other reaction(s): Dizziness   Cephalexin Rash   Duloxetine Anxiety and Nausea Only    Medications reviewed.    ROS Full ROS performed and is otherwise negative other than what is stated in HPI   BP 135/87   Pulse (!) 105   Temp 98.3 F (36.8 C) (Oral)   Wt 248 lb 3.2 oz (112.6 kg)   SpO2 97%   BMI  33.66 kg/m   Physical Exam  CONSTITUTIONAL: NAD. EYES: Pupils are equal, round,  Sclera are non-icteric. EARS, NOSE, MOUTH AND THROAT:  The oral mucosa is pink and moist. Hearing is intact to voice. RESPIRATORY:  Lungs with wheezes and rales. There is normal respiratory effort, with equal breath sounds bilaterally, and without pathologic use of accessory muscles. CARDIOVASCULAR: Heart is regular without murmurs, gallops, or rubs. GI: The abdomen is  soft, nontender, and nondistended. Reducible 2 cms umbilical hernia, There are no palpable masses. There is no hepatosplenomegaly. There are normal bowel sounds  GU: Rectal deferred.   MUSCULOSKELETAL: Normal muscle strength and tone. No cyanosis or edema.    SKIN: Turgor is good and there are no pathologic skin lesions or ulcers. NEUROLOGIC: Motor and sensation is grossly normal. Cranial nerves are grossly intact. PSYCH:  Oriented to person, place and time. Affect is normal.   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   I do think that the hernia is interfering with his pulmonary issues.  Patient had a reducible umbilical hernia that I think can be fixed at the same operative setting.   He is the process of completing EGD within the next few weeks and we will see him back and at that time penciled down at time for the repair of the paraesophageal hernia.  We did talk about it and he is in agreement. I spent 30 minutes in this encounter including personally reviewing imaging studies, medical records, placing orders, counseling the patient and performing appropriate documentation   Caroleen Hamman, MD [place] Surgeon

## 2021-11-13 ENCOUNTER — Ambulatory Visit: Payer: HMO | Admitting: Gastroenterology

## 2021-11-21 ENCOUNTER — Ambulatory Visit: Payer: PPO | Admitting: Nurse Practitioner

## 2021-11-25 NOTE — Progress Notes (Unsigned)
PROVIDER NOTE: Information contained herein reflects review and annotations entered in association with encounter. Interpretation of such information and data should be left to medically-trained personnel. Information provided to patient can be located elsewhere in the medical record under "Patient Instructions". Document created using STT-dictation technology, any transcriptional errors that may result from process are unintentional.    Patient: Darren Allen  Service Category: E/M  Provider: Gaspar Cola, MD  DOB: 1965/10/02  DOS: 11/26/2021  Referring Provider: Jon Billings, NP  MRN: [id number]  Specialty: Interventional Pain Management  PCP: Jon Billings, NP  Type: Established Patient  Setting: Ambulatory outpatient    Location: Office  Delivery: Face-to-face     Primary Reason(s) for Visit: Encounter for evaluation before starting new chronic pain management plan of care (Level of risk: moderate) CC: No chief complaint on file.  HPI  [name] is a 56 y.o. year old, male patient, who comes today for a follow-up evaluation to review the test results and decide on a treatment plan. He has Chronic obstructive pulmonary disease ([place]); Acid reflux; HLD (hyperlipidemia); Eunuchoidism; Calculus of kidney; Adiposity; Apnea, sleep; Testicular hypofunction; Allergic state; Perirectal abscess; Perianal abscess; Ankle fracture; Hypertension; Neuropathy; Diabetic peripheral neuropathy ([place]); Type 2 diabetes mellitus with diabetic neuropathy, without long-term current use of insulin ([place]); Gout; PVD (peripheral vascular disease) ([place]); Chronic ankle pain (Bilateral); Chronic low back pain (1ry area of Pain) (Bilateral) (R>L) w/o sciatica; Other acquired hammer toe; Chronic pain syndrome; Pharmacologic therapy; Disorder of skeletal system; Problems influencing health status; Lumbar facet syndrome; Lumbosacral radiculopathy at S1 (Left); Chronic feet pain (2ry area of Pain) (Bilateral); Chronic  ankle pain (Left); Abnormal NCS (nerve conduction studies) (02/20/2020); Elevated C-reactive protein (CRP); Abnormal MRI, lumbar spine (03/11/2020); DDD (degenerative disc disease), lumbosacral; and Lumbosacral lateral recess stenosis (Left: L5-S1) on their problem list. His primarily concern today is the No chief complaint on file.  Pain Assessment: Location:     Radiating:   Onset:   Duration:   Quality:   Severity:  /10 (subjective, self-reported pain score)  Effect on ADL:   Timing:   Modifying factors:   BP:    HR:    [name] comes in today for a follow-up visit after his initial evaluation on 10/29/2021. Today we went over the results of his tests. These were explained in "Layman's terms". During today's appointment we went over my diagnostic impression, as well as the proposed treatment plan.  ***  In considering the treatment plan options, Mr. Gorniak was reminded that I no longer take patients for medication management only. I asked him to let me know if he had no intention of taking advantage of the interventional therapies, so that we could make arrangements to provide this space to someone interested. I also made it clear that undergoing interventional therapies for the purpose of getting pain medications is very inappropriate on the part of a patient, and it will not be tolerated in this practice. This type of behavior would suggest true addiction and therefore it requires referral to an addiction specialist.   Further details on both, my assessment(s), as well as the proposed treatment plan, please see below.  Controlled Substance Pharmacotherapy Assessment REMS (Risk Evaluation and Mitigation Strategy)  Opioid Analgesic: Hydrocodone/APAP 5/325 tablet, 1 tab p.o. twice daily (#20) (last filled on 10/20/2021) MME/day: 10 mg/day  Pill Count: None expected due to no prior prescriptions written by our practice. No notes on file Pharmacokinetics: Liberation and absorption (onset  of action): WNL  Distribution (time to peak effect): WNL Metabolism and excretion (duration of action): WNL         Pharmacodynamics: Desired effects: Analgesia: Mr. Fedora reports >50% benefit. Functional ability: Patient reports that medication allows him to accomplish basic ADLs Clinically meaningful improvement in function (CMIF): Sustained CMIF goals met Perceived effectiveness: Described as relatively effective, allowing for increase in activities of daily living (ADL) Undesirable effects: Side-effects or Adverse reactions: None reported Monitoring: [place] PMP: PDMP reviewed during this encounter. Online review of the past 55-monthperiod previously conducted. Not applicable at this point since we have not taken over the patient's medication management yet. List of other Serum/Urine Drug Screening Test(s):  Lab Results  Component Value Date   COCAINSCRNUR Negative 09/08/2021   CANNABQUANT Negative 09/08/2021   ETH 179 (H) 03/22/2017   ETH 23 (H) 12/10/2016   List of all UDS test(s) done:  Lab Results  Component Value Date   SUMMARY Note 09/08/2021   Last UDS on record: Summary  Date Value Ref Range Status  09/08/2021 Note  Final    Comment:    ==================================================================== Compliance Drug Analysis, Ur ==================================================================== Test                             Result       Flag       Units  Drug Present and Declared for Prescription Verification   Hydrocodone                    697          EXPECTED   ng/mg creat   Dihydrocodeine                 36           EXPECTED   ng/mg creat   Norhydrocodone                 1265         EXPECTED   ng/mg creat    Sources of hydrocodone include scheduled prescription medications.    Dihydrocodeine and norhydrocodone are expected metabolites of    hydrocodone. Dihydrocodeine is also available as a scheduled    prescription medication.    Gabapentin                      PRESENT      EXPECTED   Amitriptyline                  PRESENT      EXPECTED   Nortriptyline                  PRESENT      EXPECTED    Nortriptyline is an expected metabolite of amitriptyline.    Acetaminophen                  PRESENT      EXPECTED  Drug Absent but Declared for Prescription Verification   Metoprolol                     Not Detected UNEXPECTED ==================================================================== Test                      Result    Flag   Units      Ref Range   Creatinine  284              mg/dL      >=20 ==================================================================== Declared Medications:  The flagging and interpretation on this report are based on the  following declared medications.  Unexpected results may arise from  inaccuracies in the declared medications.   **Note: The testing scope of this panel includes these medications:   Amitriptyline (Elavil)  Gabapentin (Neurontin)  Hydrocodone (Norco)  Metoprolol   **Note: The testing scope of this panel does not include small to  moderate amounts of these reported medications:   Acetaminophen (Tylenol)  Acetaminophen (Norco)   **Note: The testing scope of this panel does not include the  following reported medications:   Albuterol (Proair HFA)  Fluticasone (Flonase)  Fluticasone (Trelegy)  Hydrochlorothiazide  Insulin (Humulin)  Iron  Levalbuterol (Xopenex)  Lisinopril (Zestril)  Metformin  Montelukast  Pantoprazole (Protonix)  Rosuvastatin (Crestor)  Testosterone  Triamcinolone (Kenalog)  Umeclidinium (Trelegy)  Vilanterol (Trelegy)  Vitamin C ==================================================================== For clinical consultation, please call [phone]. ====================================================================    UDS interpretation: No unexpected findings.          Medication Assessment Form: Not applicable. No  opioids. Treatment compliance: Not applicable Risk Assessment Profile: Aberrant behavior: See initial evaluations. None observed or detected today Comorbid factors increasing risk of overdose: See initial evaluation. No additional risks detected today Opioid risk tool (ORT):     10/29/2021   11:00 AM  Opioid Risk   Alcohol 3  Illegal Drugs 2  Rx Drugs 0  Alcohol 0  Illegal Drugs 0  Rx Drugs 0  Age between 16-45 years  0  History of Preadolescent Sexual Abuse 0  Psychological Disease 0  Depression 0  Opioid Risk Tool Scoring 5  Opioid Risk Interpretation Moderate Risk    ORT Scoring interpretation table:  Score <3 = Low Risk for SUD  Score between 4-7 = Moderate Risk for SUD  Score >8 = High Risk for Opioid Abuse   Risk of substance use disorder (SUD): Low  Risk Mitigation Strategies:  Patient opioid safety counseling: No controlled substances prescribed. Patient-Prescriber Agreement (PPA): No agreement signed.  Controlled substance notification to other providers: None required. No opioid therapy.  Pharmacologic Plan: Non-opioid analgesic therapy offered. Interventional alternatives discussed.             Laboratory Chemistry Profile   Renal Lab Results  Component Value Date   BUN 13 10/23/2021   CREATININE 1.22 10/23/2021   BCR 11 10/23/2021   GFRAA 59 (L) 12/07/2019   GFRNONAA >60 05/30/2021   SPECGRAV 1.025 10/23/2021   PHUR 6.0 10/23/2021   PROTEINUR 1+ (A) 10/23/2021     Electrolytes Lab Results  Component Value Date   NA 138 10/23/2021   K 4.3 10/23/2021   CL 100 10/23/2021   CALCIUM 9.9 10/23/2021   MG 1.7 09/08/2021   PHOS 3.2 04/01/2010     Hepatic Lab Results  Component Value Date   AST 25 10/23/2021   ALT 29 10/23/2021   ALBUMIN 4.4 10/23/2021   ALKPHOS 70 10/23/2021     ID Lab Results  Component Value Date   HIV Non Reactive 05/29/2021   SARSCOV2NAA NEGATIVE 07/10/2021   STAPHAUREUS NEGATIVE 03/23/2017   MRSAPCR NEGATIVE  03/23/2017     Bone Lab Results  Component Value Date   25OHVITD1 44 09/08/2021   25OHVITD2 <1.0 09/08/2021   25OHVITD3 43 09/08/2021   TESTOSTERONE 757 09/12/2021     Endocrine Lab Results  Component  Value Date   GLUCOSE 172 (H) 10/23/2021   GLUCOSEU Negative 10/23/2021   HGBA1C 7.8 (H) 10/23/2021   TSH 2.040 10/23/2021   TESTOSTERONE 757 09/12/2021     Neuropathy Lab Results  Component Value Date   VITAMINB12 391 09/08/2021   HGBA1C 7.8 (H) 10/23/2021   HIV Non Reactive 05/29/2021     CNS No results found for: "COLORCSF", "APPEARCSF", "RBCCOUNTCSF", "WBCCSF", "POLYSCSF", "LYMPHSCSF", "EOSCSF", "PROTEINCSF", "GLUCCSF", "JCVIRUS", "CSFOLI", "IGGCSF", "LABACHR", "ACETBL"   Inflammation (CRP: Acute  ESR: Chronic) Lab Results  Component Value Date   CRP 1.8 (H) 09/08/2021   ESRSEDRATE 17 09/08/2021   LATICACIDVEN 1.7 05/28/2021     Rheumatology Lab Results  Component Value Date   LABURIC 6.8 05/28/2021     Coagulation Lab Results  Component Value Date   INR 0.90 03/22/2017   LABPROT 12.1 03/22/2017   APTT 26 03/22/2017   PLT 216 10/23/2021     Cardiovascular Lab Results  Component Value Date   BNP 23.0 02/11/2015   CKTOTAL 477 (H) 09/25/2019   CKMB 2.4 04/02/2010   TROPONINI <0.03 12/10/2016   HGB 13.2 10/23/2021   HCT 40.0 10/23/2021     Screening Lab Results  Component Value Date   SARSCOV2NAA NEGATIVE 07/10/2021   STAPHAUREUS NEGATIVE 03/23/2017   MRSAPCR NEGATIVE 03/23/2017   HIV Non Reactive 05/29/2021     Cancer No results found for: "CEA", "CA125", "LABCA2"   Allergens No results found for: "ALMOND", "APPLE", "ASPARAGUS", "AVOCADO", "BANANA", "BARLEY", "BASIL", "BAYLEAF", "GREENBEAN", "LIMABEAN", "WHITEBEAN", "BEEFIGE", "REDBEET", "BLUEBERRY", "BROCCOLI", "CABBAGE", "MELON", "CARROT", "CASEIN", "CASHEWNUT", "CAULIFLOWER", "CELERY"     Note: Lab results reviewed.  Recent Diagnostic Imaging Review  Cervical Imaging: Cervical MR wo  contrast: No results found for this or any previous visit.  Cervical MR wo contrast: No valid procedures specified. Cervical CT wo contrast: No results found for this or any previous visit.  Cervical DG Bending/F/E views: No results found for this or any previous visit.   Shoulder Imaging: Shoulder-R MR wo contrast: No results found for this or any previous visit.  Shoulder-L MR wo contrast: No results found for this or any previous visit.  Shoulder-R DG: No results found for this or any previous visit.  Shoulder-L DG: No results found for this or any previous visit.   Thoracic Imaging: Thoracic MR wo contrast: No results found for this or any previous visit.  Thoracic MR wo contrast: No valid procedures specified. Thoracic CT wo contrast: No results found for this or any previous visit.  Thoracic DG 4 views: No results found for this or any previous visit.  Thoracic DG w/swimmers view: No results found for this or any previous visit.   Lumbosacral Imaging: Lumbar MR wo contrast: Results for orders placed during the hospital encounter of 03/11/20  MR LUMBAR SPINE WO CONTRAST  Narrative CLINICAL DATA:  Progressive chronic low back pain. Radiating into bilateral legs, right worse than left.  EXAM: MRI LUMBAR SPINE WITHOUT CONTRAST  TECHNIQUE: Multiplanar, multisequence MR imaging of the lumbar spine was performed. No intravenous contrast was administered.  COMPARISON:  Lumbar radiographs January 26, 2020.  FINDINGS: Segmentation: The inferior-most fully formed intervertebral disc is labeled L5-S1.  Alignment:  Physiologic.  Vertebrae:  Vertebral body heights are maintained.  Conus medullaris and cauda equina: Conus extends to the L1 level. Conus appears normal.  Paraspinal and other soft tissues: Unremarkable.  Disc levels:  T12-L1: No significant disc protrusion, foraminal stenosis, or canal stenosis.  L1-L2: No significant disc protrusion,  foraminal  stenosis, or canal stenosis.  L2-L3: Mild disc bulging without significant canal or foraminal stenosis.  L3-L4: No significant disc protrusion, foraminal stenosis, or canal stenosis.  L4-L5: Mild broad disc bulge without significant canal or foraminal stenosis.  L5-S1: Disc desiccation and height loss. Broad-based disc bulge with superimposed central disc protrusion. No significant central canal or foraminal stenosis. Mild left subarticular recess stenosis  IMPRESSION: Mild multilevel degenerative change (as detailed above) without significant canal or foraminal stenosis. Mild left subarticular recess stenosis at L5-S1.   Electronically Signed By: Margaretha Sheffield MD On: 03/11/2020 12:19  Lumbar MR wo contrast: No valid procedures specified. Lumbar CT wo contrast: No results found for this or any previous visit.  Lumbar DG Bending views: Results for orders placed during the hospital encounter of 09/08/21  DG Lumbar Spine Complete W/Bend  Narrative CLINICAL DATA:  Chronic low back pain for 15 years.  EXAM: LUMBAR SPINE - COMPLETE WITH BENDING VIEWS  COMPARISON:  October 01, 2020  FINDINGS: There is no evidence of lumbar spine fracture. Alignment is normal. Intervertebral disc spaces are maintained. Minimal anterior osteophytosis is identified at L2 and L3.  IMPRESSION: Minimal degenerative joint changes of lumbar spine.   Electronically Signed By: Abelardo Diesel M.D. On: 09/09/2021 10:28         Sacroiliac Joint Imaging: Sacroiliac Joint DG: No results found for this or any previous visit.   Hip Imaging: Hip-R MR wo contrast: No results found for this or any previous visit.  Hip-L MR wo contrast: No results found for this or any previous visit.  Hip-R CT wo contrast: No results found for this or any previous visit.  Hip-L CT wo contrast: No results found for this or any previous visit.  Hip-R DG 2-3 views: No results found for this or any previous  visit.  Hip-L DG 2-3 views: No results found for this or any previous visit.  Hip-B DG Bilateral: No results found for this or any previous visit.   Knee Imaging: Knee-R MR wo contrast: No results found for this or any previous visit.  Knee-L MR wo contrast: No results found for this or any previous visit.  Knee-R CT wo contrast: No results found for this or any previous visit.  Knee-L CT wo contrast: No results found for this or any previous visit.  Knee-R DG 4 views: No results found for this or any previous visit.  Knee-L DG 4 views: No results found for this or any previous visit.   Ankle Imaging: Ankle-R DG Complete: No results found for this or any previous visit.  Ankle-L DG Complete: No results found for this or any previous visit.   Foot Imaging: Foot-R DG Complete: No results found for this or any previous visit.  Foot-L DG Complete: Results for orders placed during the hospital encounter of 05/27/21  DG Foot Complete Left  Narrative CLINICAL DATA:  A 56 year old male presents for evaluation of suspected foot infection, history of diabetes and previous ankle surgery. Greatest area of pain first metatarsophalangeal joint.  EXAM: LEFT FOOT - COMPLETE 3+ VIEW  COMPARISON:  None aside from ankle imaging from 2019.  FINDINGS: Signs of disuse osteopenia.  Erosive changes about the tip of the distal phalanx of the second digit with overhanging, well corticated margins.  Erosion along the distal aspect of the medial distal proximal phalanx of the great toe which is less well corticated. No signs of acute fracture. No signs of dislocation.  Soft tissue swelling  about the great toe.  Irregularity of the proximal phalanx of the fifth digit. Suggest prior fracture of the distal aspect of the proximal phalanx of the fifth digit.  IMPRESSION: 1. Soft tissue swelling about the great toe. 2. Erosive changes about the distal phalanx of the second digit and also  along the distal aspect of the proximal phalanx of the great toe. Findings about the great toe raising the question of early osteomyelitis in the setting of soft tissue swelling. Findings about the second digit are better corticated than would be expected and show overhanging margins. Perhaps related to prior infection given well corticated margins. Correlate with any overlying signs of infection currently. MRI may be helpful for further evaluation. 3. Fracture of the proximal phalanx of the fifth digit of uncertain chronicity. Potentially subacute.   Electronically Signed By: Zetta Bills M.D. On: 05/27/2021 16:01   Elbow Imaging: Elbow-R DG Complete: No results found for this or any previous visit.  Elbow-L DG Complete: No results found for this or any previous visit.   Wrist Imaging: Wrist-R DG Complete: No results found for this or any previous visit.  Wrist-L DG Complete: No results found for this or any previous visit.   Hand Imaging: Hand-R DG Complete: No results found for this or any previous visit.  Hand-L DG Complete: Results for orders placed during the hospital encounter of 12/10/16  DG Hand Complete Left  Narrative CLINICAL DATA:  Status post fall last night following a syncopal episode. Persistent left hand pain involving the palm in third digit.  EXAM: LEFT HAND - COMPLETE 3+ VIEW  COMPARISON:  None in PACs  FINDINGS: The bones are subjectively adequately mineralized. There is no acute fracture nor dislocation. The soft tissues are unremarkable. The interphalangeal and MCP joint spaces are reasonably well-maintained. The observed portions of the wrist exhibit no acute abnormalities. The soft tissues are unremarkable.  IMPRESSION: There is no acute bony abnormality of the left hand.   Electronically Signed By: David  Martinique M.D. On: 12/10/2016 14:55   Complexity Note: Imaging results reviewed.                         Meds   Current  Outpatient Medications:    Accu-Chek Softclix Lancets lancets, 3 (three) times daily., Disp: , Rfl:    acetaminophen (TYLENOL) 500 MG tablet, Take 2 tablets (1,000 mg total) by mouth every 8 (eight) hours., Disp: 30 tablet, Rfl: 0   amitriptyline (ELAVIL) 10 MG tablet, Take 30 mg by mouth at bedtime., Disp: , Rfl:    ascorbic acid (VITAMIN C) 500 MG tablet, Take by mouth daily., Disp: , Rfl:    BD DISP NEEDLES 22G X 1-1/2" MISC, every 14 (fourteen) days., Disp: , Rfl:    ferrous sulfate 325 (65 FE) MG tablet, Take 325 mg by mouth daily with breakfast., Disp: , Rfl:    fluticasone (FLONASE) 50 MCG/ACT nasal spray, Place into both nostrils., Disp: , Rfl:    gabapentin (NEURONTIN) 600 MG tablet, Take 600 mg by mouth 3 (three) times daily., Disp: , Rfl:    hydrochlorothiazide (HYDRODIURIL) 25 MG tablet, Take 25 mg by mouth daily., Disp: , Rfl: 7   HYDROcodone-acetaminophen (NORCO/VICODIN) 5-325 MG tablet, Take 1 tablet by mouth 2 (two) times daily as needed. Must last 30 days., Disp: 60 tablet, Rfl: 0   insulin isophane & regular human (HUMULIN 70/30 KWIKPEN) (70-30) 100 UNIT/ML KwikPen, Inject 42-48 Units into the skin 2 (  two) times daily with a meal. 48 units every morning and 42 units every evening, Disp: , Rfl:    lisinopril (PRINIVIL,ZESTRIL) 40 MG tablet, Take 40 mg by mouth daily., Disp: , Rfl: 11   metFORMIN (GLUCOPHAGE-XR) 500 MG 24 hr tablet, Take 4 tablets (2,000 mg total) by mouth daily., Disp: 90 tablet, Rfl: 1   metFORMIN (GLUCOPHAGE-XR) 500 MG 24 hr tablet, Take by mouth., Disp: , Rfl:    metoprolol tartrate (LOPRESSOR) 100 MG tablet, Take 1 tablet (100 mg total) by mouth once for 1 dose. Please take one time dose 197m metoprolol tartrate 2 hr prior to cardiac CT for HR control IF HR >55bpm., Disp: 1 tablet, Rfl: 0   montelukast (SINGULAIR) 10 MG tablet, Take 10 mg by mouth daily., Disp: , Rfl:    NEEDLE, DISP, 18 G (B-D BLUNT FILL NEEDLE) 18G X 1-1/2" MISC, Use 18G needle to draw  testosterone for injection, Disp: 50 each, Rfl: 0   pantoprazole (PROTONIX) 40 MG tablet, Take 2 tablets (80 mg total) by mouth daily., Disp: 180 tablet, Rfl: 1   PROAIR HFA 108 (90 Base) MCG/ACT inhaler, Inhale 2 puffs into the lungs every 6 (six) hours as needed., Disp: , Rfl:    rosuvastatin (CRESTOR) 40 MG tablet, Take 40 mg by mouth daily., Disp: , Rfl:    SYRINGE-NEEDLE, DISP, 3 ML (BD ECLIPSE SYRINGE) 21G X 1" 3 ML MISC, Use to administer testosterone, Disp: 50 each, Rfl: 0   testosterone cypionate (DEPOTESTOSTERONE CYPIONATE) 200 MG/ML injection, Inject 0.5 mLs (100 mg total) into the muscle every 14 (fourteen) days., Disp: 10 mL, Rfl: 0   TRELEGY ELLIPTA 200-62.5-25 MCG/INH AEPB, Inhale 1 puff into the lungs daily., Disp: , Rfl:    triamcinolone cream (KENALOG) 0.1 %, Apply 1 application  topically 2 (two) times daily., Disp: 30 g, Rfl: 0   TRULICITY 1.5 MOY/7.7AJSOPN, Inject 3 mg into the skin once a week., Disp: , Rfl:   ROS  Constitutional: Denies any fever or chills Gastrointestinal: No reported hemesis, hematochezia, vomiting, or acute GI distress Musculoskeletal: Denies any acute onset joint swelling, redness, loss of ROM, or weakness Neurological: No reported episodes of acute onset apraxia, aphasia, dysarthria, agnosia, amnesia, paralysis, loss of coordination, or loss of consciousness  Allergies  Mr. FGriffithis allergic to glipizide, cephalexin, and duloxetine.  PFSH  Drug: Mr. FStillings reports no history of drug use. Alcohol:  reports that he does not currently use alcohol after a past usage of about 12.0 standard drinks of alcohol per week. Tobacco:  reports that he has been smoking cigarettes. He has a 34.00 pack-year smoking history. He has never used smokeless tobacco. Medical:  has a past medical history of Allergy, Calculus of kidney (02/04/2015), COPD (chronic obstructive pulmonary disease) [place], Diabetes mellitus without complication ([place], Hypertension, and  Neuropathy. Surgical: Mr. FPask has a past surgical history that includes Appendectomy; Incision and drainage perirectal abscess (N/A, 02/04/2015); Rectal exam under anesthesia (02/04/2015); ORIF ankle fracture (Left, 03/23/2017); and Syndesmosis repair (Left, 03/23/2017). Family: family history includes Alcohol abuse in his father; Cancer in his father; Cancer (age of onset: 551 in his brother; Cancer (age of onset: 826 in his mother; Diabetes in his brother; Heart disease in his brother and father.  Constitutional Exam  General appearance: Well nourished, well developed, and well hydrated. In no apparent acute distress There were no vitals filed for this visit. BMI Assessment: Estimated body mass index is 33.66 kg/m as calculated from the  following:   Height as of 10/29/21: 6' (1.829 m).   Weight as of 11/10/21: 248 lb 3.2 oz (112.6 kg).  BMI interpretation table: BMI level Category Range association with higher incidence of chronic pain  <18 kg/m2 Underweight   18.5-24.9 kg/m2 Ideal body weight   25-29.9 kg/m2 Overweight Increased incidence by 20%  30-34.9 kg/m2 Obese (Class I) Increased incidence by 68%  35-39.9 kg/m2 Severe obesity (Class II) Increased incidence by 136%  >40 kg/m2 Extreme obesity (Class III) Increased incidence by 254%   Patient's current BMI Ideal Body weight  There is no height or weight on file to calculate BMI. Patient weight not recorded   BMI Readings from Last 4 Encounters:  11/10/21 33.66 kg/m  10/29/21 33.91 kg/m  10/27/21 34.23 kg/m  10/23/21 32.78 kg/m   Wt Readings from Last 4 Encounters:  11/10/21 248 lb 3.2 oz (112.6 kg)  10/29/21 250 lb (113.4 kg)  10/27/21 252 lb 6.4 oz (114.5 kg)  10/23/21 250 lb (113.4 kg)    Psych/Mental status: Alert, oriented x 3 (person, place, & time)       Eyes: PERLA Respiratory: No evidence of acute respiratory distress  Assessment & Plan  Primary Diagnosis & Pertinent Problem List: There were no encounter  diagnoses.  Visit Diagnosis: No diagnosis found. Problems updated and reviewed during this visit: No problems updated.  Plan of Care  Pharmacotherapy (Medications Ordered): No orders of the defined types were placed in this encounter.  Procedure Orders    No procedure(s) ordered today   Lab Orders  No laboratory test(s) ordered today   Imaging Orders  No imaging studies ordered today   Referral Orders  No referral(s) requested today    Pharmacological management options:  Opioid Analgesics: I will not be prescribing any opioids at this time Membrane stabilizer: I will not be prescribing any at this time Muscle relaxant: I will not be prescribing any at this time NSAID: I will not be prescribing any at this time Other analgesic(s): I will not be prescribing any at this time     Interventional Therapies  Risk  Complexity Considerations:   Estimated body mass index is 33.91 kg/m as calculated from the following:   Height as of this encounter: 6' (1.829 m).   Weight as of this encounter: 250 lb (113.4 kg). WNL   Planned  Pending:   Diagnostic left L5-S1 LESI #1    Under consideration:   Therapeutic bilateral Qutenza treatment #1  Diagnostic bilateral lumbar facet MBB #1  Diagnostic left L5-S1 LESI #1  Diagnostic left L5 TFESI #1  Possible spinal cord stimulator trial    Completed:   (09/08/2021 & 10/29/2021) referral to physical therapy for evaluation and treatment of low back pain.   Completed by other providers:   EMG/PNCV of lower extremity (02/20/2020) by Dr. Jennings Books (generalized sensorimotor peripheral neuropathy; superimposed left S1 radiculopathy)   Therapeutic  Palliative (PRN) options:   None established     Provider-requested follow-up: No follow-ups on file. Recent Visits Date Type Provider Dept  10/29/21 Office Visit Milinda Pointer, MD [facility]  09/08/21 Office Visit Milinda Pointer, MD [facility]  Showing  recent visits within past 90 days and meeting all other requirements Future Appointments Date Type Provider Dept  11/26/21 Appointment Milinda Pointer, MD [facility]  Showing future appointments within next 90 days and meeting all other requirements  Primary Care Physician: Jon Billings, NP Note by: Gaspar Cola, MD Date: 11/26/2021;  Time: 4:39 PM

## 2021-11-26 ENCOUNTER — Telehealth: Payer: Self-pay | Admitting: Gastroenterology

## 2021-11-26 ENCOUNTER — Telehealth: Payer: Self-pay

## 2021-11-26 ENCOUNTER — Other Ambulatory Visit: Payer: Self-pay

## 2021-11-26 ENCOUNTER — Encounter: Payer: Self-pay | Admitting: Pain Medicine

## 2021-11-26 ENCOUNTER — Ambulatory Visit: Payer: HMO | Attending: Pain Medicine | Admitting: Pain Medicine

## 2021-11-26 VITALS — BP 144/84 | HR 104 | Temp 99.0°F | Resp 16 | Ht 72.0 in | Wt 248.0 lb

## 2021-11-26 DIAGNOSIS — M47816 Spondylosis without myelopathy or radiculopathy, lumbar region: Secondary | ICD-10-CM | POA: Diagnosis present

## 2021-11-26 DIAGNOSIS — M545 Low back pain, unspecified: Secondary | ICD-10-CM | POA: Diagnosis present

## 2021-11-26 DIAGNOSIS — Z79899 Other long term (current) drug therapy: Secondary | ICD-10-CM | POA: Insufficient documentation

## 2021-11-26 DIAGNOSIS — M79671 Pain in right foot: Secondary | ICD-10-CM | POA: Diagnosis present

## 2021-11-26 DIAGNOSIS — E1142 Type 2 diabetes mellitus with diabetic polyneuropathy: Secondary | ICD-10-CM | POA: Insufficient documentation

## 2021-11-26 DIAGNOSIS — G8929 Other chronic pain: Secondary | ICD-10-CM | POA: Diagnosis present

## 2021-11-26 DIAGNOSIS — M5417 Radiculopathy, lumbosacral region: Secondary | ICD-10-CM | POA: Insufficient documentation

## 2021-11-26 DIAGNOSIS — R94130 Abnormal response to nerve stimulation, unspecified: Secondary | ICD-10-CM | POA: Insufficient documentation

## 2021-11-26 DIAGNOSIS — M5137 Other intervertebral disc degeneration, lumbosacral region: Secondary | ICD-10-CM | POA: Diagnosis present

## 2021-11-26 DIAGNOSIS — M79672 Pain in left foot: Secondary | ICD-10-CM | POA: Diagnosis present

## 2021-11-26 DIAGNOSIS — Z79891 Long term (current) use of opiate analgesic: Secondary | ICD-10-CM | POA: Insufficient documentation

## 2021-11-26 DIAGNOSIS — M4807 Spinal stenosis, lumbosacral region: Secondary | ICD-10-CM | POA: Diagnosis present

## 2021-11-26 DIAGNOSIS — R937 Abnormal findings on diagnostic imaging of other parts of musculoskeletal system: Secondary | ICD-10-CM | POA: Insufficient documentation

## 2021-11-26 MED ORDER — HYDROCODONE-ACETAMINOPHEN 5-325 MG PO TABS: 1.0000 | ORAL_TABLET | Freq: Two times a day (BID) | ORAL | 0 refills | Status: DC | PRN

## 2021-11-26 NOTE — Progress Notes (Deleted)
Safety precautions to be maintained throughout the outpatient stay will include: orient to surroundings, keep bed in low position, maintain call bell within reach at all times, provide assistance with transfer out of bed and ambulation.  

## 2021-11-26 NOTE — Telephone Encounter (Signed)
Patient left a voicemail to schedule his colonoscopy that we received a referral for. Return patient call and left a message for call back

## 2021-11-26 NOTE — Patient Instructions (Signed)
Capsaicin Patches What is this medication? CAPSAICIN (cap SAY sin) relieves minor pain in your muscles and joints. It works by making your skin feel warm or cool, which blocks pain signals going to the brain. This medicine may be used for other purposes; ask your health care provider or pharmacist if you have questions. COMMON BRAND NAME(S): Qutenza What should I tell my care team before I take this medication? They need to know if you have any of these conditions: Broken or irritated skin High blood pressure History of heart attack or stroke An unusual or allergic reaction to capsaicin, hot peppers, other medications, foods, dyes, or preservatives Pregnant or trying to get pregnant Breast-feeding How should I use this medication? This medication is for external use only. It is applied by your care team in a hospital or clinic setting. Talk to your care team about the use of this medication in children. Special care may be needed. Overdosage: If you think you have taken too much of this medicine contact a poison control center or emergency room at once. NOTE: This medicine is only for you. Do not share this medicine with others. What if I miss a dose? This does not apply. What may interact with this medication? Interactions are not expected. Do not use any other skin products on the affected area without asking your care team. This list may not describe all possible interactions. Give your health care provider a list of all the medicines, herbs, non-prescription drugs, or dietary supplements you use. Also tell them if you smoke, drink alcohol, or use illegal drugs. Some items may interact with your medicine. What should I watch for while using this medication? Your condition will be monitored carefully while you are receiving this medication. Your blood pressure may go up during the procedure. Do not touch the medication patch during treatment. This medication causes red, burning skin. You  may need pain medication for during and after the procedure. This medication can make you more sensitive to heat for a few days after treatment. Be careful in hot showers or baths. Keep out of the sun. Exercise may make the treated skin feel hotter. Tell your care team if your symptoms do not start to get better or if they get worse. What side effects may I notice from receiving this medication? Side effects that you should report to your care team as soon as possible: Allergic reactions--skin rash, itching, hives, swelling of the face, lips, tongue, or throat Side effects that usually do not require medical attention (report these to your care team if they continue or are bothersome): Mild skin irritation, redness, or dryness This list may not describe all possible side effects. Call your doctor for medical advice about side effects. You may report side effects to FDA at 1-800-FDA-1088. Where should I keep my medication? This medication is given in a hospital or clinic. It will not be stored at home. NOTE: This sheet is a summary. It may not cover all possible information. If you have questions about this medicine, talk to your doctor, pharmacist, or health care provider.  2023 Elsevier/Gold Standard (2020-10-30 00:00:00) ______________________________________________________________________  Preparing for Procedure with Sedation  NOTICE: Due to recent regulatory changes, starting on October 14, 2020, procedures requiring intravenous (IV) sedation will no longer be performed at the [facility].  These types of procedures are required to be performed at [facility] ambulatory surgery facility.  We are very sorry for the inconvenience.  Procedure appointments are limited to planned  procedures: No Prescription Refills. No disability issues will be discussed. No medication changes will be discussed.  Instructions: Oral Intake: Do not eat or drink anything for at least 8 hours prior to your  procedure. (Exception: Blood Pressure Medication. See below.) Transportation: A driver is required. You may not drive yourself after the procedure. Blood Pressure Medicine: Do not forget to take your blood pressure medicine with a sip of water the morning of the procedure. If your Diastolic (lower reading) is above 100 mmHg, elective cases will be cancelled/rescheduled. Blood thinners: These will need to be stopped for procedures. Notify our staff if you are taking any blood thinners. Depending on which one you take, there will be specific instructions on how and when to stop it. Diabetics on insulin: Notify the staff so that you can be scheduled 1st case in the morning. If your diabetes requires high dose insulin, take only  of your normal insulin dose the morning of the procedure and notify the staff that you have done so. Preventing infections: Shower with an antibacterial soap the morning of your procedure. Build-up your immune system: Take 1000 mg of Vitamin C with every meal (3 times a day) the day prior to your procedure. Antibiotics: Inform the staff if you have a condition or reason that requires you to take antibiotics before dental procedures. Pregnancy: If you are pregnant, call and cancel the procedure. Sickness: If you have a cold, fever, or any active infections, call and cancel the procedure. Arrival: You must be in the facility at least 30 minutes prior to your scheduled procedure. Children: Do not bring children with you. Dress appropriately: There is always the possibility that your clothing may get soiled. Valuables: Do not bring any jewelry or valuables.  Reasons to call and reschedule or cancel your procedure: (Following these recommendations will minimize the risk of a serious complication.) Surgeries: Avoid having procedures within 2 weeks of any surgery. (Avoid for 2 weeks before or after any surgery). Flu Shots: Avoid having procedures within 2 weeks of a flu shots. (Avoid  for 2 weeks before or after immunizations). Barium: Avoid having a procedure within 7-10 days after having had a radiological study involving the use of radiological contrast. (Myelograms, Barium swallow or enema study). Heart attacks: Avoid any elective procedures or surgeries for the initial 6 months after a "Myocardial Infarction" (Heart Attack). Blood thinners: It is imperative that you stop these medications before procedures. Let us know if you if you take any blood thinner.  Infection: Avoid procedures during or within two weeks of an infection (including chest colds or gastrointestinal problems). Symptoms associated with infections include: Localized redness, fever, chills, night sweats or profuse sweating, burning sensation when voiding, cough, congestion, stuffiness, runny nose, sore throat, diarrhea, nausea, vomiting, cold or Flu symptoms, recent or current infections. It is specially important if the infection is over the area that we intend to treat. Heart and lung problems: Symptoms that may suggest an active cardiopulmonary problem include: cough, chest pain, breathing difficulties or shortness of breath, dizziness, ankle swelling, uncontrolled high or unusually low blood pressure, and/or palpitations. If you are experiencing any of these symptoms, cancel your procedure and contact your primary care physician for an evaluation.  Remember:  Regular Business hours are:  Monday to Thursday 8:00 AM to 4:00 PM  Provider's Schedule: Milinda Pointer, MD:  Procedure days: Tuesday and Thursday 7:30 AM to 4:00 PM  Gillis Santa, MD:  Procedure days: Monday and Wednesday 7:30 AM to 4:00  PM ______________________________________________________________________  ____________________________________________________________________________________________  General Risks and Possible Complications  Patient Responsibilities: It is important that you read this as it is part of your informed  consent. It is our duty to inform you of the risks and possible complications associated with treatments offered to you. It is your responsibility as a patient to read this and to ask questions about anything that is not clear or that you believe was not covered in this document.  Patient's Rights: You have the right to refuse treatment. You also have the right to change your mind, even after initially having agreed to have the treatment done. However, under this last option, if you wait until the last second to change your mind, you may be charged for the materials used up to that point.  Introduction: Medicine is not an Chief Strategy Officer. Everything in Medicine, including the lack of treatment(s), carries the potential for danger, harm, or loss (which is by definition: Risk). In Medicine, a complication is a secondary problem, condition, or disease that can aggravate an already existing one. All treatments carry the risk of possible complications. The fact that a side effects or complications occurs, does not imply that the treatment was conducted incorrectly. It must be clearly understood that these can happen even when everything is done following the highest safety standards.  No treatment: You can choose not to proceed with the proposed treatment alternative. The "PRO(s)" would include: avoiding the risk of complications associated with the therapy. The "CON(s)" would include: not getting any of the treatment benefits. These benefits fall under one of three categories: diagnostic; therapeutic; and/or palliative. Diagnostic benefits include: getting information which can ultimately lead to improvement of the disease or symptom(s). Therapeutic benefits are those associated with the successful treatment of the disease. Finally, palliative benefits are those related to the decrease of the primary symptoms, without necessarily curing the condition (example: decreasing the pain from a flare-up of a chronic  condition, such as incurable terminal cancer).  General Risks and Complications: These are associated to most interventional treatments. They can occur alone, or in combination. They fall under one of the following six (6) categories: no benefit or worsening of symptoms; bleeding; infection; nerve damage; allergic reactions; and/or death. No benefits or worsening of symptoms: In Medicine there are no guarantees, only probabilities. No healthcare provider can ever guarantee that a medical treatment will work, they can only state the probability that it may. Furthermore, there is always the possibility that the condition may worsen, either directly, or indirectly, as a consequence of the treatment. Bleeding: This is more common if the patient is taking a blood thinner, either prescription or over the counter (example: Goody Powders, Fish oil, Aspirin, Garlic, etc.), or if suffering a condition associated with impaired coagulation (example: Hemophilia, cirrhosis of the liver, low platelet counts, etc.). However, even if you do not have one on these, it can still happen. If you have any of these conditions, or take one of these drugs, make sure to notify your treating physician. Infection: This is more common in patients with a compromised immune system, either due to disease (example: diabetes, cancer, human immunodeficiency virus [HIV], etc.), or due to medications or treatments (example: therapies used to treat cancer and rheumatological diseases). However, even if you do not have one on these, it can still happen. If you have any of these conditions, or take one of these drugs, make sure to notify your treating physician. Nerve Damage: This is more common when the  treatment is an invasive one, but it can also happen with the use of medications, such as those used in the treatment of cancer. The damage can occur to small secondary nerves, or to large primary ones, such as those in the spinal cord and brain.  This damage may be temporary or permanent and it may lead to impairments that can range from temporary numbness to permanent paralysis and/or brain death. Allergic Reactions: Any time a substance or material comes in contact with our body, there is the possibility of an allergic reaction. These can range from a mild skin rash (contact dermatitis) to a severe systemic reaction (anaphylactic reaction), which can result in death. Death: In general, any medical intervention can result in death, most of the time due to an unforeseen complication. ____________________________________________________________________________________________ ____________________________________________________________________________________________  Pharmacy Shortages of Pain Medication   Introduction Shockingly as it may seem, .  "No U.[place] Supreme Court decision has ever interpreted the Constitution as guaranteeing a right to health care for all Americans." - https://huff.com/  "With respect to human rights, the [place] has no formally codified right to health, nor does it participate in a human rights treaty that specifies a right to health." - Scott J. Schweikart, JD, MBE  Situation By now, most of our patients have had the experience of being told by their pharmacist that they do not have enough medication to cover their prescription. If you have not had this experience, just know that you soon will.  Problem There appears to be a shortage of these medications, either at the national level or locally. This is happening with all pharmacies. When there is not enough medication, patients are offered a partial fill and they are told that they will try to get the rest of the medicine for them at a later time. If they do not have enough for even a partial fill, the pharmacists are telling the patients to call us (the prescribing physicians) to request  that we send another prescription to another pharmacy to get the medicine.   This reordering of a controlled substance creates documentation problems where additional paperwork needs to be created to explain why two prescriptions for the same period of time and the same medicine are being prescribed to the same patient. It also creates situations where the last appointment note does not accurately reflect when and what prescriptions were given to a patient. This leads to prescribing errors down the line, in subsequent follow-up visits.   [employer] of Pharmacy ([employer]) Research revealed that Surveyor, quantity .1806 (21 NCAC 46.1806) authorizes pharmacists to the transfer of prescriptions among pharmacies, and it sets forth procedural and recordkeeping requirements for doing so. However, this requires the pharmacist to complete the previously mentioned procedural paperwork to accomplish the transfer. As it turns out, it is much easier for them to have the prescribing physicians do the work.   Possible solutions 1. You can ask your physician to assist you in weaning yourself off these medications. 2. Ask your pharmacy if the medication is in stock, 3 days prior to your refill. 3. If you need a pharmacy change, let us know at your medication management visit. Prescriptions that have already been electronically sent to a pharmacy will not be re-sent to a different pharmacy if your pharmacy of record does not have it in stock. Proper stocking of medication is a pharmacy problem, not a prescriber problem. Work with your pharmacist to solve the problem. 4. Have the [facility]  State Assembly add a provision to the "STOP ACT" (the law that mandates how controlled substances are prescribed) where there is an exception to the electronic prescribing rule that states that in the event there are shortages of medications the physicians are allowed to use written prescriptions as opposed to electronic  ones. This would allow patients to take their prescriptions to a different pharmacy that may have enough medication available to fill the prescription. The problem is that currently there is a law that does not allow for written prescriptions, with the exception of instances where the electronic medical record is down due to technical issues.  5. Have [place] Congress ease the pressure on pharmaceutical companies, allowing them to produce enough quantities of the medication to adequately supply the population. 6. Have pharmacies keep enough stocks of these medications to cover their client base.  7. Have the [facility] Assembly add a provision to the "STOP ACT" where they ease the regulations surrounding the transfer of controlled substances between pharmacies, so as to simplify the transfer of supplies. As an alternative, develop a system to allow patients to obtain the remainder of their prescription at another one of their pharmacies or at an associate pharmacy.   How this shortage will affect you.  Understand that this is a pharmacy supply problem, not a prescriber problem. Work with your pharmacy to solve it. The job of the prescriber is to evaluate and monitor the patient for the appropriate indications and use of these medicines. It is not the job of the prescriber to supply the medication or to solve problems with that supply. The responsibility and the choice to obtain the medication resides on the patient. By law, supplying the medication is the job of the pharmacy. It is certainly not the job of the prescriber to solve supply problems.   Due to the above problems we are no longer taking patients to write for their pain medication. Future discussions with your physician may include potentially weaning medications or transitioning to alternatives.  We will be focusing primarily on interventional based pain management. We will continue to evaluate for appropriate indications and we may provide  recommendations regarding medication, dose, and schedule, as well as monitoring recommendations, however, we will not be taking over the actual prescribing of these substances. On those patients where we are treating their chronic pain with interventional therapies, exceptions will be considered on a case by case basis. At this time, we will try to continue providing this supplemental service to those patients we have been managing in the past. However, as of August 1st, 2023, we no longer will be sending additional prescriptions to other pharmacies for the purpose of solving their supply problems. Once we send a prescription to a pharmacy, we will not be resending it again to another pharmacy to cover for their shortages.   What to do. Write as many letters as you can. Recruit the help of family members in writing these letters. Below are some of the places where you can write to make your voice heard. Let them know what the problem is and push them to look for solutions.   Search internet for: "[employer] find your legislators" NoseSwap.is  Search internet for: "[employer] commissioner complaints" Starlas.fi  Search internet for: "[place] complaints" https://www.hernandez-brewer.com/.htm  Search internet for: "[facility] pharmacy complaints" Email [facility] Customer Relations woondaal.com.jsp?callType=store  Search internet for: Programme researcher, broadcasting/film/video customer service complaints" https://www.[facility].com/topic/marketing/contactus/contactus_customerservice.jsp  ____________________________________________________________________________________________  ____________________________________________________________________________________________  Medication  Rules  Purpose: To inform patients, and their family members, of our rules and  regulations.  Applies to: All patients receiving prescriptions (written or electronic).  Pharmacy of record: Pharmacy where electronic prescriptions will be sent. If written prescriptions are taken to a different pharmacy, please inform the nursing staff. The pharmacy listed in the electronic medical record should be the one where you would like electronic prescriptions to be sent.  Electronic prescriptions: In compliance with the [place] (STOP) Act of 2017 (Session Lanny Cramp [id number]), effective March 16, 2018, all controlled substances must be electronically prescribed. Calling prescriptions to the pharmacy will cease to exist.  Prescription refills: Only during scheduled appointments. Applies to all prescriptions.  NOTE: The following applies primarily to controlled substances (Opioid* Pain Medications).   Type of encounter (visit): For patients receiving controlled substances, face-to-face visits are required. (Not an option or up to the patient.)  Patient's responsibilities: Pain Pills: Bring all pain pills to every appointment (except for procedure appointments). Pill Bottles: Bring pills in original pharmacy bottle. Always bring the newest bottle. Bring bottle, even if empty. Medication refills: You are responsible for knowing and keeping track of what medications you take and those you need refilled. The day before your appointment: write a list of all prescriptions that need to be refilled. The day of the appointment: give the list to the admitting nurse. Prescriptions will be written only during appointments. No prescriptions will be written on procedure days. If you forget a medication: it will not be "Called in", "Faxed", or "electronically sent". You will need to get another appointment to get these prescribed. No early refills. Do not call asking to have your prescription filled early. Prescription Accuracy: You are responsible for  carefully inspecting your prescriptions before leaving our office. Have the discharge nurse carefully go over each prescription with you, before taking them home. Make sure that your name is accurately spelled, that your address is correct. Check the name and dose of your medication to make sure it is accurate. Check the number of pills, and the written instructions to make sure they are clear and accurate. Make sure that you are given enough medication to last until your next medication refill appointment. Taking Medication: Take medication as prescribed. When it comes to controlled substances, taking less pills or less frequently than prescribed is permitted and encouraged. Never take more pills than instructed. Never take medication more frequently than prescribed.  Inform other Doctors: Always inform, all of your healthcare providers, of all the medications you take. Pain Medication from other Providers: You are not allowed to accept any additional pain medication from any other Doctor or Healthcare provider. There are two exceptions to this rule. (see below) In the event that you require additional pain medication, you are responsible for notifying us, as stated below. Cough Medicine: Often these contain an opioid, such as codeine or hydrocodone. Never accept or take cough medicine containing these opioids if you are already taking an opioid* medication. The combination may cause respiratory failure and death. Medication Agreement: You are responsible for carefully reading and following our Medication Agreement. This must be signed before receiving any prescriptions from our practice. Safely store a copy of your signed Agreement. Violations to the Agreement will result in no further prescriptions. (Additional copies of our Medication Agreement are available upon request.) Laws, Rules, & Regulations: All patients are expected to follow all Federal and [employer], [employer], Rules, [employer]. Ignorance  of the Laws  does not constitute a valid excuse.  Illegal drugs and Controlled Substances: The use of illegal substances (including, but not limited to marijuana and its derivatives) and/or the illegal use of any controlled substances is strictly prohibited. Violation of this rule may result in the immediate and permanent discontinuation of any and all prescriptions being written by our practice. The use of any illegal substances is prohibited. Adopted CDC guidelines & recommendations: Target dosing levels will be at or below 60 MME/day. Use of benzodiazepines** is not recommended.  Exceptions: There are only two exceptions to the rule of not receiving pain medications from other Healthcare Providers. Exception #1 (Emergencies): In the event of an emergency (i.e.: accident requiring emergency care), you are allowed to receive additional pain medication. However, you are responsible for: As soon as you are able, call our office [phone], at any time of the day or night, and leave a message stating your name, the date and nature of the emergency, and the name and dose of the medication prescribed. In the event that your call is answered by a member of our staff, make sure to document and save the date, time, and the name of the person that took your information.  Exception #2 (Planned Surgery): In the event that you are scheduled by another doctor or dentist to have any type of surgery or procedure, you are allowed (for a period no longer than 30 days), to receive additional pain medication, for the acute post-op pain. However, in this case, you are responsible for picking up a copy of our "Post-op Pain Management for Surgeons" handout, and giving it to your surgeon or dentist. This document is available at our office, and does not require an appointment to obtain it. Simply go to our office during business hours (Monday-Thursday from 8:00 AM to 4:00 PM) (Friday 8:00 AM to 12:00 Noon) or if you have a  scheduled appointment with [place], prior to your surgery, and ask for it by name. In addition, you are responsible for: calling our office [phone], at any time of the day or night, and leaving a message stating your name, name of your surgeon, type of surgery, and date of procedure or surgery. Failure to comply with your responsibilities may result in termination of therapy involving the controlled substances. Medication Agreement Violation. Following the above rules, including your responsibilities will help you in avoiding a Medication Agreement Violation ("Breaking your Pain Medication Contract").  *Opioid medications include: morphine, codeine, oxycodone, oxymorphone, hydrocodone, hydromorphone, meperidine, tramadol, tapentadol, buprenorphine, fentanyl, methadone. **Benzodiazepine medications include: diazepam (Valium), alprazolam (Xanax), clonazepam (Klonopine), lorazepam (Ativan), clorazepate (Tranxene), chlordiazepoxide (Librium), estazolam (Prosom), oxazepam (Serax), temazepam (Restoril), triazolam (Halcion) (Last updated: 12/11/2020) ____________________________________________________________________________________________  ____________________________________________________________________________________________  Medication Recommendations and Reminders  Applies to: All patients receiving prescriptions (written and/or electronic).  Medication Rules & Regulations: These rules and regulations exist for your safety and that of others. They are not flexible and neither are we. Dismissing or ignoring them will be considered "non-compliance" with medication therapy, resulting in complete and irreversible termination of such therapy. (See document titled "Medication Rules" for more details.) In all conscience, because of safety reasons, we cannot continue providing a therapy where the patient does not follow instructions.  Pharmacy of record:  Definition: This is the pharmacy where your  electronic prescriptions will be sent.  We do not endorse any particular pharmacy, however, we have experienced problems with [facility] not securing enough medication supply for the community. We do not restrict you in your  choice of pharmacy. However, once we write for your prescriptions, we will NOT be re-sending more prescriptions to fix restricted supply problems created by your pharmacy, or your insurance.  The pharmacy listed in the electronic medical record should be the one where you want electronic prescriptions to be sent. If you choose to change pharmacy, simply notify our nursing staff.  Recommendations: Keep all of your pain medications in a safe place, under lock and key, even if you live alone. We will NOT replace lost, stolen, or damaged medication. After you fill your prescription, take 1 week's worth of pills and put them away in a safe place. You should keep a separate, properly labeled bottle for this purpose. The remainder should be kept in the original bottle. Use this as your primary supply, until it runs out. Once it's gone, then you know that you have 1 week's worth of medicine, and it is time to come in for a prescription refill. If you do this correctly, it is unlikely that you will ever run out of medicine. To make sure that the above recommendation works, it is very important that you make sure your medication refill appointments are scheduled at least 1 week before you run out of medicine. To do this in an effective manner, make sure that you do not leave the office without scheduling your next medication management appointment. Always ask the nursing staff to show you in your prescription , when your medication will be running out. Then arrange for the receptionist to get you a return appointment, at least 7 days before you run out of medicine. Do not wait until you have 1 or 2 pills left, to come in. This is very poor planning and does not take into consideration that we may  need to cancel appointments due to bad weather, sickness, or emergencies affecting our staff. DO NOT ACCEPT A "Partial Fill": If for any reason your pharmacy does not have enough pills/tablets to completely fill or refill your prescription, do not allow for a "partial fill". The law allows the pharmacy to complete that prescription within 72 hours, without requiring a new prescription. If they do not fill the rest of your prescription within those 72 hours, you will need a separate prescription to fill the remaining amount, which we will NOT provide. If the reason for the partial fill is your insurance, you will need to talk to the pharmacist about payment alternatives for the remaining tablets, but again, DO NOT ACCEPT A PARTIAL FILL, unless you can trust your pharmacist to obtain the remainder of the pills within 72 hours.  Prescription refills and/or changes in medication(s):  Prescription refills, and/or changes in dose or medication, will be conducted only during scheduled medication management appointments. (Applies to both, written and electronic prescriptions.) No refills on procedure days. No medication will be changed or started on procedure days. No changes, adjustments, and/or refills will be conducted on a procedure day. Doing so will interfere with the diagnostic portion of the procedure. No phone refills. No medications will be "called into the pharmacy". No Fax refills. No weekend refills. No Holliday refills. No after hours refills.  Remember:  Business hours are:  Monday to Thursday 8:00 AM to 4:00 PM Provider's Schedule: Milinda Pointer, MD - Appointments are:  Medication management: Monday and Wednesday 8:00 AM to 4:00 PM Procedure day: Tuesday and Thursday 7:30 AM to 4:00 PM Gillis Santa, MD - Appointments are:  Medication management: Tuesday and Thursday 8:00 AM to 4:00  PM Procedure day: Monday and Wednesday 7:30 AM to 4:00 PM (Last update:  10/04/2019) ____________________________________________________________________________________________  ____________________________________________________________________________________________  CBD (cannabidiol) & Delta-8 (Delta-8 tetrahydrocannabinol) WARNING  Intro: Cannabidiol (CBD) and tetrahydrocannabinol (THC), are two natural compounds found in plants of the Cannabis genus. They can both be extracted from hemp or cannabis. Hemp and cannabis come from the Cannabis sativa plant. Both compounds interact with your body's endocannabinoid system, but they have very different effects. CBD does not produce the high sensation associated with cannabis. Delta-8 tetrahydrocannabinol, also known as delta-8 THC, is a psychoactive substance found in the Cannabis sativa plant, of which marijuana and hemp are two varieties. THC is responsible for the high associated with the illicit use of marijuana.  Applicable to: All individuals currently taking or considering taking CBD (cannabidiol) and, more important, all patients taking opioid analgesic controlled substances (pain medication). (Example: oxycodone; oxymorphone; hydrocodone; hydromorphone; morphine; methadone; tramadol; tapentadol; fentanyl; buprenorphine; butorphanol; dextromethorphan; meperidine; codeine; etc.)  Legal status: CBD remains a Schedule I drug prohibited for any use. CBD is illegal with one exception. In the [place], CBD has a limited Transport planner (FDA) approval for the treatment of two specific types of epilepsy disorders. Only one CBD product has been approved by the FDA for this purpose: "Epidiolex". FDA is aware that some companies are marketing products containing cannabis and cannabis-derived compounds in ways that violate the [employer], Drug and Cosmetic Act [facility] Act) and that may put the health and safety of consumers at risk. The FDA, a Federal agency, has not enforced the CBD status since 2018. UPDATE:  (05/02/2021) The Drug Enforcement Agency ([place]) issued a letter stating that "delta" cannabinoids, including Delta-8-THCO and Delta-9-THCO, synthetically derived from hemp do not qualify as hemp and will be viewed as Schedule I drugs. (Schedule I drugs, substances, or chemicals are defined as drugs with no currently accepted medical use and a high potential for abuse. Some examples of Schedule I drugs are: heroin, lysergic acid diethylamide (LSD), marijuana (cannabis), 3,4-methylenedioxymethamphetamine (ecstasy), methaqualone, and peyote.) (https://jennings.com/)  Legality: Some manufacturers ship CBD products nationally, which is illegal. Often such products are sold online and are therefore available throughout the country. CBD is openly sold in head shops and health food stores in some states where such sales have not been explicitly legalized. Selling unapproved products with unsubstantiated therapeutic claims is not only a violation of the law, but also can put patients at risk, as these products have not been proven to be safe or effective. Federal illegality makes it difficult to conduct research on CBD.  Reference: "FDA Regulation of Cannabis and Cannabis-Derived Products, Including Cannabidiol (CBD)" - [employer].pt  Warning: CBD is not FDA approved and has not undergo the same manufacturing controls as prescription drugs.  This means that the purity and safety of available CBD may be questionable. Most of the time, despite manufacturer's claims, it is contaminated with THC (delta-9-tetrahydrocannabinol - the chemical in marijuana responsible for the "HIGH").  When this is the case, the [facility] contaminant will trigger a positive urine drug screen (UDS) test for Marijuana (carboxy-THC). Because a positive UDS for any illicit substance is a violation of our medication agreement, your opioid  analgesics (pain medicine) may be permanently discontinued. The FDA recently put out a warning about 5 things that everyone should be aware of regarding Delta-8 THC: Delta-8 THC products have not been evaluated or approved by the FDA for safe use and may be marketed in ways that put the public health  at risk. The FDA has received adverse event reports involving delta-8 THC-containing products. Delta-8 THC has psychoactive and intoxicating effects. Delta-8 THC manufacturing often involve use of potentially harmful chemicals to create the concentrations of delta-8 THC claimed in the marketplace. The final delta-8 THC product may have potentially harmful by-products (contaminants) due to the chemicals used in the process. Manufacturing of delta-8 THC products may occur in uncontrolled or unsanitary settings, which may lead to the presence of unsafe contaminants or other potentially harmful substances. Delta-8 THC products should be kept out of the reach of children and pets.  MORE ABOUT CBD  General Information: CBD was discovered in 17 and it is a derivative of the cannabis sativa genus plants (Marijuana and Hemp). It is one of the 113 identified substances found in Marijuana. It accounts for up to 40% of the plant's extract. As of 2018, preliminary clinical studies on CBD included research for the treatment of anxiety, movement disorders, and pain. CBD is available and consumed in multiple forms, including inhalation of smoke or vapor, as an aerosol spray, and by mouth. It may be supplied as an oil containing CBD, capsules, dried cannabis, or as a liquid solution. CBD is thought not to be as psychoactive as THC (delta-9-tetrahydrocannabinol - the chemical in marijuana responsible for the "HIGH"). Studies suggest that CBD may interact with different biological target receptors in the body, including cannabinoid and other neurotransmitter receptors. As of 2018 the mechanism of action for its biological  effects has not been determined.  Side-effects  Adverse reactions: Dry mouth, diarrhea, decreased appetite, fatigue, drowsiness, malaise, weakness, sleep disturbances, and others.  Drug interactions: CBC may interact with other medications such as blood-thinners. Because CBD causes drowsiness on its own, it also increases the drowsiness caused by other medications, including antihistamines (such as Benadryl), benzodiazepines (Xanax, Ativan, Valium), antipsychotics, antidepressants and opioids, as well as alcohol and supplements such as kava, melatonin and St. John's Wort. Be cautious with the following combinations:   Brivaracetam (Briviact) Brivaracetam is changed and broken down by the body. CBD might decrease how quickly the body breaks down brivaracetam. This might increase levels of brivaracetam in the body.  Caffeine Caffeine is changed and broken down by the body. CBD might decrease how quickly the body breaks down caffeine. This might increase levels of caffeine in the body.  Carbamazepine (Tegretol) Carbamazepine is changed and broken down by the body. CBD might decrease how quickly the body breaks down carbamazepine. This might increase levels of carbamazepine in the body and increase its side effects.  Citalopram (Celexa) Citalopram is changed and broken down by the body. CBD might decrease how quickly the body breaks down citalopram. This might increase levels of citalopram in the body and increase its side effects.  Clobazam (Onfi) Clobazam is changed and broken down by the liver. CBD might decrease how quickly the liver breaks down clobazam. This might increase the effects and side effects of clobazam.  Eslicarbazepine (Aptiom) Eslicarbazepine is changed and broken down by the body. CBD might decrease how quickly the body breaks down eslicarbazepine. This might increase levels of eslicarbazepine in the body by a small amount.  Everolimus (Zostress) Everolimus is changed and  broken down by the body. CBD might decrease how quickly the body breaks down everolimus. This might increase levels of everolimus in the body.  Lithium Taking higher doses of CBD might increase levels of lithium. This can increase the risk of lithium toxicity.  Medications changed by the liver (Cytochrome P450  1A1 (CYP1A1) substrates) Some medications are changed and broken down by the liver. CBD might change how quickly the liver breaks down these medications. This could change the effects and side effects of these medications.  Medications changed by the liver (Cytochrome P450 1A2 (CYP1A2) substrates) Some medications are changed and broken down by the liver. CBD might change how quickly the liver breaks down these medications. This could change the effects and side effects of these medications.  Medications changed by the liver (Cytochrome P450 1B1 (CYP1B1) substrates) Some medications are changed and broken down by the liver. CBD might change how quickly the liver breaks down these medications. This could change the effects and side effects of these medications.  Medications changed by the liver (Cytochrome P450 2A6 (CYP2A6) substrates) Some medications are changed and broken down by the liver. CBD might change how quickly the liver breaks down these medications. This could change the effects and side effects of these medications.  Medications changed by the liver (Cytochrome P450 2B6 (CYP2B6) substrates) Some medications are changed and broken down by the liver. CBD might change how quickly the liver breaks down these medications. This could change the effects and side effects of these medications.  Medications changed by the liver (Cytochrome P450 2C19 (CYP2C19) substrates) Some medications are changed and broken down by the liver. CBD might change how quickly the liver breaks down these medications. This could change the effects and side effects of these medications.  Medications  changed by the liver (Cytochrome P450 2C8 (CYP2C8) substrates) Some medications are changed and broken down by the liver. CBD might change how quickly the liver breaks down these medications. This could change the effects and side effects of these medications.  Medications changed by the liver (Cytochrome P450 2C9 (CYP2C9) substrates) Some medications are changed and broken down by the liver. CBD might change how quickly the liver breaks down these medications. This could change the effects and side effects of these medications.  Medications changed by the liver (Cytochrome P450 2D6 (CYP2D6) substrates) Some medications are changed and broken down by the liver. CBD might change how quickly the liver breaks down these medications. This could change the effects and side effects of these medications.  Medications changed by the liver (Cytochrome P450 2E1 (CYP2E1) substrates) Some medications are changed and broken down by the liver. CBD might change how quickly the liver breaks down these medications. This could change the effects and side effects of these medications.  Medications changed by the liver (Cytochrome P450 3A4 (CYP3A4) substrates) Some medications are changed and broken down by the liver. CBD might change how quickly the liver breaks down these medications. This could change the effects and side effects of these medications.  Medications changed by the liver (Glucuronidated drugs) Some medications are changed and broken down by the liver. CBD might change how quickly the liver breaks down these medications. This could change the effects and side effects of these medications.  Medications that decrease the breakdown of other medications by the liver (Cytochrome P450 2C19 (CYP2C19) inhibitors) CBD is changed and broken down by the liver. Some drugs decrease how quickly the liver changes and breaks down CBD. This could change the effects and side effects of CBD.  Medications that decrease  the breakdown of other medications in the liver (Cytochrome P450 3A4 (CYP3A4) inhibitors) CBD is changed and broken down by the liver. Some drugs decrease how quickly the liver changes and breaks down CBD. This could change the effects and side  effects of CBD.  Medications that increase breakdown of other medications by the liver (Cytochrome P450 3A4 (CYP3A4) inducers) CBD is changed and broken down by the liver. Some drugs increase how quickly the liver changes and breaks down CBD. This could change the effects and side effects of CBD.  Medications that increase the breakdown of other medications by the liver (Cytochrome P450 2C19 (CYP2C19) inducers) CBD is changed and broken down by the liver. Some drugs increase how quickly the liver changes and breaks down CBD. This could change the effects and side effects of CBD.  Methadone (Dolophine) Methadone is broken down by the liver. CBD might decrease how quickly the liver breaks down methadone. Taking cannabidiol along with methadone might increase the effects and side effects of methadone.  Rufinamide (Banzel) Rufinamide is changed and broken down by the body. CBD might decrease how quickly the body breaks down rufinamide. This might increase levels of rufinamide in the body by a small amount.  Sedative medications (CNS depressants) CBD might cause sleepiness and slowed breathing. Some medications, called sedatives, can also cause sleepiness and slowed breathing. Taking CBD with sedative medications might cause breathing problems and/or too much sleepiness.  Sirolimus (Rapamune) Sirolimus is changed and broken down by the body. CBD might decrease how quickly the body breaks down sirolimus. This might increase levels of sirolimus in the body.  Stiripentol (Diacomit) Stiripentol is changed and broken down by the body. CBD might decrease how quickly the body breaks down stiripentol. This might increase levels of stiripentol in the body and increase  its side effects.  Tacrolimus (Prograf) Tacrolimus is changed and broken down by the body. CBD might decrease how quickly the body breaks down tacrolimus. This might increase levels of tacrolimus in the body.  Tamoxifen (Soltamox) Tamoxifen is changed and broken down by the body. CBD might affect how quickly the body breaks down tamoxifen. This might affect levels of tamoxifen in the body.  Topiramate (Topamax) Topiramate is changed and broken down by the body. CBD might decrease how quickly the body breaks down topiramate. This might increase levels of topiramate in the body by a small amount.  Valproate Valproic acid can cause liver injury. Taking cannabidiol with valproic acid might increase the chance of liver injury. CBD and/or valproic acid might need to be stopped, or the dose might need to be reduced.  Warfarin (Coumadin) CBD might increase levels of warfarin, which can increase the risk for bleeding. CBD and/or warfarin might need to be stopped, or the dose might need to be reduced.  Zonisamide Zonisamide is changed and broken down by the body. CBD might decrease how quickly the body breaks down zonisamide. This might increase levels of zonisamide in the body by a small amount. (Last update: 05/14/2021) ____________________________________________________________________________________________  ____________________________________________________________________________________________  Drug Holidays (Slow)  What is a "Drug Holiday"? Drug Holiday: is the name given to the period of time during which a patient stops taking a medication(s) for the purpose of eliminating tolerance to the drug.  Benefits Improved effectiveness of opioids. Decreased opioid dose needed to achieve benefits. Improved pain with lesser dose.  What is tolerance? Tolerance: is the progressive decreased in effectiveness of a drug due to its repetitive use. With repetitive use, the body gets use to the  medication and as a consequence, it loses its effectiveness. This is a common problem seen with opioid pain medications. As a result, a larger dose of the drug is needed to achieve the same effect that used to  be obtained with a smaller dose.  How long should a "Drug Holiday" last? You should stay off of the pain medicine for at least 14 consecutive days. (2 weeks)  Should I stop the medicine "cold [place]"? No. You should always coordinate with your Pain Specialist so that he/she can provide you with the correct medication dose to make the transition as smoothly as possible.  How do I stop the medicine? Slowly. You will be instructed to decrease the daily amount of pills that you take by one (1) pill every seven (7) days. This is called a "slow downward taper" of your dose. For example: if you normally take four (4) pills per day, you will be asked to drop this dose to three (3) pills per day for seven (7) days, then to two (2) pills per day for seven (7) days, then to one (1) per day for seven (7) days, and at the end of those last seven (7) days, this is when the "Drug Holiday" would start.   Will I have withdrawals? By doing a "slow downward taper" like this one, it is unlikely that you will experience any significant withdrawal symptoms. Typically, what triggers withdrawals is the sudden stop of a high dose opioid therapy. Withdrawals can usually be avoided by slowly decreasing the dose over a prolonged period of time. If you do not follow these instructions and decide to stop your medication abruptly, withdrawals may be possible.  What are withdrawals? Withdrawals: refers to the wide range of symptoms that occur after stopping or dramatically reducing opiate drugs after heavy and prolonged use. Withdrawal symptoms do not occur to patients that use low dose opioids, or those who take the medication sporadically. Contrary to benzodiazepine (example: Valium, Xanax, etc.) or alcohol withdrawals  ("Delirium Tremens"), opioid withdrawals are not lethal. Withdrawals are the physical manifestation of the body getting rid of the excess receptors.  Expected Symptoms Early symptoms of withdrawal may include: Agitation Anxiety Muscle aches Increased tearing Insomnia Runny nose Sweating Yawning  Late symptoms of withdrawal may include: Abdominal cramping Diarrhea Dilated pupils Goose bumps Nausea Vomiting  Will I experience withdrawals? Due to the slow nature of the taper, it is very unlikely that you will experience any.  What is a slow taper? Taper: refers to the gradual decrease in dose.  (Last update: 10/04/2019) ____________________________________________________________________________________________

## 2021-11-26 NOTE — Telephone Encounter (Signed)
Patient left vm wanting to schedule a colonoscopy. Requesting call back.

## 2021-11-26 NOTE — Telephone Encounter (Signed)
Gastroenterology Pre-Procedure Review  Request Date:  Requesting Physician: Dr. Vicente Males  PATIENT REVIEW QUESTIONS: The patient responded to the following health history questions as indicated:    1. Are you having any GI issues? Patient has a hiatal hernia and has appointment 12/25/2021 to discuss getting EGD and colon  2. Do you have a personal history of Polyps?  3. Do you have a family history of Colon Cancer or Polyps?  4. Diabetes Mellitus?  5. Joint replacements in the past 12 months? 6. Major health problems in the past 3 months? 7. Any artificial heart valves, MVP, or defibrillator?    MEDICATIONS & ALLERGIES:    Patient reports the following regarding taking any anticoagulation/antiplatelet therapy:   Plavix, Coumadin, Eliquis, Xarelto, Lovenox, Pradaxa, Brilinta, or Effient? Aspirin  Patient confirms/reports the following medications:  Current Outpatient Medications  Medication Sig Dispense Refill   Accu-Chek Softclix Lancets lancets 3 (three) times daily.     acetaminophen (TYLENOL) 500 MG tablet Take 2 tablets (1,000 mg total) by mouth every 8 (eight) hours. 30 tablet 0   amitriptyline (ELAVIL) 10 MG tablet Take 30 mg by mouth at bedtime.     ascorbic acid (VITAMIN C) 500 MG tablet Take by mouth daily.     BD DISP NEEDLES 22G X 1-1/2" MISC every 14 (fourteen) days.     ezetimibe (ZETIA) 10 MG tablet Take 10 mg by mouth daily.     ferrous sulfate 325 (65 FE) MG tablet Take 325 mg by mouth daily with breakfast.     fluticasone (FLONASE) 50 MCG/ACT nasal spray Place into both nostrils.     gabapentin (NEURONTIN) 600 MG tablet Take 600 mg by mouth 3 (three) times daily.     hydrochlorothiazide (HYDRODIURIL) 25 MG tablet Take 25 mg by mouth daily.  7   [START ON 11/28/2021] HYDROcodone-acetaminophen (NORCO/VICODIN) 5-325 MG tablet Take 1 tablet by mouth 2 (two) times daily as needed. Must last 30 days. 60 tablet 0   [START ON 12/28/2021] HYDROcodone-acetaminophen (NORCO/VICODIN)  5-325 MG tablet Take 1 tablet by mouth 2 (two) times daily as needed. Must last 30 days. 60 tablet 0   insulin isophane & regular human (HUMULIN 70/30 KWIKPEN) (70-30) 100 UNIT/ML KwikPen Inject 58 Units into the skin 2 (two) times daily with a meal. 58 with meals and 54 qhs     lisinopril (PRINIVIL,ZESTRIL) 40 MG tablet Take 40 mg by mouth daily.  11   metFORMIN (GLUCOPHAGE-XR) 500 MG 24 hr tablet Take 4 tablets (2,000 mg total) by mouth daily. 90 tablet 1   metoprolol tartrate (LOPRESSOR) 100 MG tablet Take 1 tablet (100 mg total) by mouth once for 1 dose. Please take one time dose '100mg'$  metoprolol tartrate 2 hr prior to cardiac CT for HR control IF HR >55bpm. 1 tablet 0   montelukast (SINGULAIR) 10 MG tablet Take 10 mg by mouth daily.     NEEDLE, DISP, 18 G (B-D BLUNT FILL NEEDLE) 18G X 1-1/2" MISC Use 18G needle to draw testosterone for injection 50 each 0   pantoprazole (PROTONIX) 40 MG tablet Take 2 tablets (80 mg total) by mouth daily. 180 tablet 1   PROAIR HFA 108 (90 Base) MCG/ACT inhaler Inhale 2 puffs into the lungs every 6 (six) hours as needed.     rosuvastatin (CRESTOR) 40 MG tablet Take 40 mg by mouth daily.     SYRINGE-NEEDLE, DISP, 3 ML (BD ECLIPSE SYRINGE) 21G X 1" 3 ML MISC Use to administer testosterone 50 each 0  testosterone cypionate (DEPOTESTOSTERONE CYPIONATE) 200 MG/ML injection Inject 0.5 mLs (100 mg total) into the muscle every 14 (fourteen) days. 10 mL 0   TRELEGY ELLIPTA 200-62.5-25 MCG/INH AEPB Inhale 1 puff into the lungs daily.     triamcinolone cream (KENALOG) 0.1 % Apply 1 application  topically 2 (two) times daily. 30 g 0   TRULICITY 3 PV/3.7SM SOPN Inject 3 mg into the skin once a week.     No current facility-administered medications for this visit.    Patient confirms/reports the following allergies:  Allergies  Allergen Reactions   Glipizide Other (See Comments) and Palpitations    Shaky, feel bad Other reaction(s): Dizziness   Cephalexin Rash    Duloxetine Anxiety and Nausea Only    No orders of the defined types were placed in this encounter.   AUTHORIZATION INFORMATION Primary Insurance: 1D#: Group #:  Secondary Insurance: 1D#: Group #:  SCHEDULE INFORMATION: Date:  Time: Location:

## 2021-11-26 NOTE — Progress Notes (Signed)
Nursing Pain Medication Assessment:  Safety precautions to be maintained throughout the outpatient stay will include: orient to surroundings, keep bed in low position, maintain call bell within reach at all times, provide assistance with transfer out of bed and ambulation.  Medication Inspection Compliance: Pill count conducted under aseptic conditions, in front of the patient. Neither the pills nor the bottle was removed from the patient's sight at any time. Once count was completed pills were immediately returned to the patient in their original bottle.  Medication: Hydrocodone/APAP Pill/Patch Count:  8 of 60 pills remain Pill/Patch Appearance: Markings consistent with prescribed medication Bottle Appearance: Standard pharmacy container. Clearly labeled. Filled Date: 08 / 16 / 2023 Last Medication intake:  Today

## 2021-12-01 NOTE — Progress Notes (Deleted)
PROVIDER NOTE: Interpretation of information contained herein should be left to medically-trained personnel. Specific patient instructions are provided elsewhere under "Patient Instructions" section of medical record. This document was created in part using STT-dictation technology, any transcriptional errors that may result from this process are unintentional.  Patient: Darren Allen Type: Established DOB: 09-May-1965 MRN: [id number] PCP: Jon Billings, NP  Service: Procedure DOS: 12/02/2021 Setting: Ambulatory Location: Ambulatory outpatient facility Delivery: Face-to-face Provider: Gaspar Cola, MD Specialty: Interventional Pain Management Specialty designation: 09 Location: Outpatient facility Ref. Prov.: Jon Billings, NP    Primary Reason for Visit: Interventional Pain Management Treatment. CC: No chief complaint on file.   Procedure:           Type: Lumbar epidural steroid injection (LESI) (interlaminar)  ***     Laterality: Left   Level:  L5-S1 Level.  Imaging: Fluoroscopic guidance         Anesthesia: Local anesthesia (1-2% Lidocaine) Anxiolysis: None                 Sedation:                         DOS: 12/02/2021  Performed by: Gaspar Cola, MD  Purpose: Diagnostic/Therapeutic Indications: Lumbar radicular pain of intraspinal etiology of more than 4 weeks that has failed to respond to conservative therapy and is severe enough to impact quality of life or function. No diagnosis found. NAS-11 Pain score:   Pre-procedure:  /10   Post-procedure:  /10      Position / Prep / Materials:  Position: Prone w/ head of the table raised (slight reverse trendelenburg) to facilitate breathing.  Prep solution: DuraPrep (Iodine Povacrylex [0.7% available iodine] and Isopropyl Alcohol, 74% w/w) Prep Area: Entire Posterior Lumbar Region from lower scapular tip down to mid buttocks area and from flank to flank. Materials:  Tray: Epidural tray Needle(s):   Type: Epidural needle (Tuohy) Gauge (G):  17 Length: Regular (3.5-in) Qty: 1  Pre-op H&P Assessment:  Darren Allen is a 56 y.o. (year old), male patient, seen today for interventional treatment. He  has a past surgical history that includes Appendectomy; Incision and drainage perirectal abscess (N/A, 02/04/2015); Rectal exam under anesthesia (02/04/2015); ORIF ankle fracture (Left, 03/23/2017); and Syndesmosis repair (Left, 03/23/2017). Darren Allen has a current medication list which includes the following prescription(s): accu-chek softclix lancets, acetaminophen, amitriptyline, ascorbic acid, bd disp needles, ezetimibe, ferrous sulfate, fluticasone, gabapentin, hydrochlorothiazide, hydrocodone-acetaminophen, [START ON 12/28/2021] hydrocodone-acetaminophen, humulin 70/30 kwikpen, lisinopril, metformin, metoprolol tartrate, montelukast, b-d blunt fill needle, pantoprazole, proair hfa, rosuvastatin, bd eclipse syringe, testosterone cypionate, trelegy ellipta, triamcinolone cream, and trulicity. His primarily concern today is the No chief complaint on file.  Initial Vital Signs:  Pulse/HCG Rate:    Temp:   Resp:   BP:   SpO2:    BMI: Estimated body mass index is 33.63 kg/m as calculated from the following:   Height as of 11/26/21: 6' (1.829 m).   Weight as of 11/26/21: 248 lb (112.5 kg).  Risk Assessment: Allergies: Reviewed. He is allergic to glipizide, cephalexin, and duloxetine.  Allergy Precautions: None required Coagulopathies: Reviewed. None identified.  Blood-thinner therapy: None at this time Active Infection(s): Reviewed. None identified. Darren Allen is afebrile  Site Confirmation: Darren Allen was asked to confirm the procedure and laterality before marking the site Procedure checklist: Completed Consent: Before the procedure and under the influence of no sedative(s), amnesic(s), or anxiolytics, the patient was informed of the  treatment options, risks and possible complications. To fulfill  our ethical and legal obligations, as recommended by the American Medical Association's Code of Ethics, I have informed the patient of my clinical impression; the nature and purpose of the treatment or procedure; the risks, benefits, and possible complications of the intervention; the alternatives, including doing nothing; the risk(s) and benefit(s) of the alternative treatment(s) or procedure(s); and the risk(s) and benefit(s) of doing nothing. The patient was provided information about the general risks and possible complications associated with the procedure. These may include, but are not limited to: failure to achieve desired goals, infection, bleeding, organ or nerve damage, allergic reactions, paralysis, and death. In addition, the patient was informed of those risks and complications associated to Spine-related procedures, such as failure to decrease pain; infection (i.e.: Meningitis, epidural or intraspinal abscess); bleeding (i.e.: epidural hematoma, subarachnoid hemorrhage, or any other type of intraspinal or peri-dural bleeding); organ or nerve damage (i.e.: Any type of peripheral nerve, nerve root, or spinal cord injury) with subsequent damage to sensory, motor, and/or autonomic systems, resulting in permanent pain, numbness, and/or weakness of one or several areas of the body; allergic reactions; (i.e.: anaphylactic reaction); and/or death. Furthermore, the patient was informed of those risks and complications associated with the medications. These include, but are not limited to: allergic reactions (i.e.: anaphylactic or anaphylactoid reaction(s)); adrenal axis suppression; blood sugar elevation that in diabetics may result in ketoacidosis or comma; water retention that in patients with history of congestive heart failure may result in shortness of breath, pulmonary edema, and decompensation with resultant heart failure; weight gain; swelling or edema; medication-induced neural toxicity;  particulate matter embolism and blood vessel occlusion with resultant organ, and/or nervous system infarction; and/or aseptic necrosis of one or more joints. Finally, the patient was informed that Medicine is not an exact science; therefore, there is also the possibility of unforeseen or unpredictable risks and/or possible complications that may result in a catastrophic outcome. The patient indicated having understood very clearly. We have given the patient no guarantees and we have made no promises. Enough time was given to the patient to ask questions, all of which were answered to the patient's satisfaction. Mr. Poteete has indicated that he wanted to continue with the procedure. Attestation: I, the ordering provider, attest that I have discussed with the patient the benefits, risks, side-effects, alternatives, likelihood of achieving goals, and potential problems during recovery for the procedure that I have provided informed consent. Date  Time: {CHL [facility]-PAIN TIME CHOICES:21018001}  Pre-Procedure Preparation:  Monitoring: As per clinic protocol. Respiration, ETCO2, SpO2, BP, heart rate and rhythm monitor placed and checked for adequate function Safety Precautions: Patient was assessed for positional comfort and pressure points before starting the procedure. Time-out: I initiated and conducted the "Time-out" before starting the procedure, as per protocol. The patient was asked to participate by confirming the accuracy of the "Time Out" information. Verification of the correct person, site, and procedure were performed and confirmed by me, the nursing staff, and the patient. "Time-out" conducted as per Joint Commission's Universal Protocol (UP.01.01.01). Time:    Description/Narrative of Procedure:          Target: Epidural space via interlaminar opening, initially targeting the lower laminar border of the superior vertebral body. Region: Lumbar Approach: Percutaneous paravertebral  Rationale  (medical necessity): procedure needed and proper for the diagnosis and/or treatment of the patient's medical symptoms and needs. Procedural Technique Safety Precautions: Aspiration looking for blood return was conducted prior to all  injections. At no point did we inject any substances, as a needle was being advanced. No attempts were made at seeking any paresthesias. Safe injection practices and needle disposal techniques used. Medications properly checked for expiration dates. SDV (single dose vial) medications used. Description of the Procedure: Protocol guidelines were followed. The procedure needle was introduced through the skin, ipsilateral to the reported pain, and advanced to the target area. Bone was contacted and the needle walked caudad, until the lamina was cleared. The epidural space was identified using "loss-of-resistance technique" with 2-3 ml of PF-NaCl (0.9% NSS), in a 5cc LOR glass syringe.  There were no vitals filed for this visit.  Start Time:   hrs. End Time:   hrs.  Imaging Guidance (Spinal):          Type of Imaging Technique: Fluoroscopy Guidance (Spinal) Indication(s): Assistance in needle guidance and placement for procedures requiring needle placement in or near specific anatomical locations not easily accessible without such assistance. Exposure Time: Please see nurses notes. Contrast: Before injecting any contrast, we confirmed that the patient did not have an allergy to iodine, shellfish, or radiological contrast. Once satisfactory needle placement was completed at the desired level, radiological contrast was injected. Contrast injected under live fluoroscopy. No contrast complications. See chart for type and volume of contrast used. Fluoroscopic Guidance: I was personally present during the use of fluoroscopy. "Tunnel Vision Technique" used to obtain the best possible view of the target area. Parallax error corrected before commencing the procedure.  "Direction-depth-direction" technique used to introduce the needle under continuous pulsed fluoroscopy. Once target was reached, antero-posterior, oblique, and lateral fluoroscopic projection used confirm needle placement in all planes. Images permanently stored in EMR. Interpretation: I personally interpreted the imaging intraoperatively. Adequate needle placement confirmed in multiple planes. Appropriate spread of contrast into desired area was observed. No evidence of afferent or efferent intravascular uptake. No intrathecal or subarachnoid spread observed. Permanent images saved into the patient's record.  Antibiotic Prophylaxis:   Anti-infectives (From admission, onward)    None      Indication(s): None identified  Post-operative Assessment:  Post-procedure Vital Signs:  Pulse/HCG Rate:    Temp:   Resp:   BP:   SpO2:    EBL: None  Complications: No immediate post-treatment complications observed by team, or reported by patient.  Note: The patient tolerated the entire procedure well. A repeat set of vitals were taken after the procedure and the patient was kept under observation following institutional policy, for this type of procedure. Post-procedural neurological assessment was performed, showing return to baseline, prior to discharge. The patient was provided with post-procedure discharge instructions, including a section on how to identify potential problems. Should any problems arise concerning this procedure, the patient was given instructions to immediately contact us, at any time, without hesitation. In any case, we plan to contact the patient by telephone for a follow-up status report regarding this interventional procedure.  Comments:  No additional relevant information.  Plan of Care  Orders:  No orders of the defined types were placed in this encounter.  Chronic Opioid Analgesic:  Hydrocodone/APAP 5/325 tablet, 1 tab p.o. twice daily (#60) MME/day: 10 mg/day    Medications ordered for procedure: No orders of the defined types were placed in this encounter.  Medications administered: Walker A. Warwick had no medications administered during this visit.  See the medical record for exact dosing, route, and time of administration.  Follow-up plan:   No follow-ups on file.  Interventional Therapies  Risk  Complexity Considerations:   Estimated body mass index is 33.91 kg/m as calculated from the following:   Height as of this encounter: 6' (1.829 m).   Weight as of this encounter: 250 lb (113.4 kg). WNL   Planned  Pending:   Diagnostic/therapeutic left L5-S1 LESI #1  Therapeutic bilateral Qutenza treatment #1    Under consideration:   Therapeutic bilateral Qutenza treatment #1  Diagnostic bilateral lumbar facet MBB #1  Diagnostic left L5-S1 LESI #1  Diagnostic/therapeutic left L5 & S1 TFESI #1  Possible spinal cord stimulator trial  Therapeutic left L5-S1 percutaneous discectomy with "[employer] Dekompressor" system    Completed:   (09/08/2021 & 10/29/2021) referral to physical therapy for evaluation and treatment of low back pain.   Completed by other providers:   EMG/PNCV of lower extremity (02/20/2020) by Dr. Jennings Books (generalized sensorimotor peripheral neuropathy; superimposed left S1 radiculopathy)   Therapeutic  Palliative (PRN) options:   None established      Recent Visits Date Type Provider Dept  11/26/21 Office Visit Milinda Pointer, MD [facility]  10/29/21 Office Visit Milinda Pointer, MD [facility]  09/08/21 Office Visit Milinda Pointer, MD [facility]  Showing recent visits within past 90 days and meeting all other requirements Future Appointments Date Type Provider Dept  12/02/21 Appointment Milinda Pointer, [facility]  12/16/21 Appointment Milinda Pointer, [facility]  01/20/22 Appointment Milinda Pointer, MD [facility]  Showing future appointments within next 90 days and meeting all other requirements  Disposition: Discharge home  Discharge (Date  Time): 12/02/2021;   hrs.   Primary Care Physician: Jon Billings, NP Location: [facility] Note by: Gaspar Cola, MD Date: 12/02/2021; Time: 6:27 AM  Disclaimer:  Medicine is not an Chief Strategy Officer. The only guarantee in medicine is that nothing is guaranteed. It is important to note that the decision to proceed with this intervention was based on the information collected from the patient. The Data and conclusions were drawn from the patient's questionnaire, the interview, and the physical examination. Because the information was provided in large part by the patient, it cannot be guaranteed that it has not been purposely or unconsciously manipulated. Every effort has been made to obtain as much relevant data as possible for this evaluation. It is important to note that the conclusions that lead to this procedure are derived in large part from the available data. Always take into account that the treatment will also be dependent on availability of resources and existing treatment guidelines, considered by other Pain Management Practitioners as being common knowledge and practice, at the time of the intervention. For Medico-Legal purposes, it is also important to point out that variation in procedural techniques and pharmacological choices are the acceptable norm. The indications, contraindications, technique, and results of the above procedure should only be interpreted and judged by a Board-Certified Interventional Pain Specialist with extensive familiarity and expertise in the same exact procedure and technique.

## 2021-12-02 ENCOUNTER — Ambulatory Visit: Payer: HMO | Admitting: Pain Medicine

## 2021-12-08 ENCOUNTER — Ambulatory Visit
Admission: EM | Admit: 2021-12-08 | Discharge: 2021-12-08 | Disposition: A | Discharge status: Home / Self Care | Payer: HMO | Attending: Internal Medicine | Admitting: Internal Medicine

## 2021-12-08 DIAGNOSIS — Z20822 Contact with and (suspected) exposure to covid-19: Secondary | ICD-10-CM | POA: Insufficient documentation

## 2021-12-08 DIAGNOSIS — J209 Acute bronchitis, unspecified: Secondary | ICD-10-CM | POA: Diagnosis not present

## 2021-12-08 DIAGNOSIS — J40 Bronchitis, not specified as acute or chronic: Secondary | ICD-10-CM

## 2021-12-08 DIAGNOSIS — J441 Chronic obstructive pulmonary disease with (acute) exacerbation: Secondary | ICD-10-CM | POA: Diagnosis present

## 2021-12-08 LAB — SARS CORONAVIRUS 2 BY RT PCR: SARS Coronavirus 2 by RT PCR: NEGATIVE

## 2021-12-08 MED ORDER — DOXYCYCLINE HYCLATE 100 MG PO CAPS: 100.0000 mg | ORAL_CAPSULE | Freq: Two times a day (BID) | ORAL | 0 refills | Status: DC

## 2021-12-08 MED ORDER — PREDNISONE 10 MG (21) PO TBPK: ORAL_TABLET | Freq: Every day | ORAL | 0 refills | Status: DC

## 2021-12-08 NOTE — ED Provider Notes (Signed)
[facility]    CSN: [id number] Arrival date & time: 12/08/21  1714      History   Chief Complaint Chief Complaint  Patient presents with   Cough   Nasal Congestion    HPI Darren Allen is a 56 y.o. male who presents with nose congestion and cough x 9 days. His cough is productive and has had cough attacks. Had left over Doxy and took one dose today. Has been more wheezy and SOB x 2 days. Diarrhea onset today x 2.  Today has felt light headed. Has been fatigued but normal appetite. Denies fever, chills or sweats. Has not done a covid test.  Pt would like to have a Covid test.  Has been using his albuterol inhaler q 3 hours in the past 3 days. Has a neb machine, but has not used it.   Past Medical History:  Diagnosis Date   Allergy    Calculus of kidney 02/04/2015   COPD (chronic obstructive pulmonary disease) (HCC)    Diabetes mellitus without complication ([place])    type 2   Hypertension    Neuropathy     Patient Active Problem List   Diagnosis Date Noted   Abnormal MRI, lumbar spine (03/11/2020) 10/29/2021   DDD (degenerative disc disease), lumbosacral 10/29/2021   Lumbosacral lateral recess stenosis (Left: L5-S1) 10/29/2021   Elevated C-reactive protein (CRP) 09/18/2021   Chronic pain syndrome 09/08/2021   Pharmacologic therapy 09/08/2021   Disorder of skeletal system 09/08/2021   Problems influencing health status 09/08/2021   Lumbar facet syndrome 09/08/2021   Lumbosacral radiculopathy at S1 (Left) 09/08/2021   Chronic feet pain (2ry area of Pain) (Bilateral) 09/08/2021   Chronic ankle pain (Left) 09/08/2021   Abnormal NCS (nerve conduction studies) (02/20/2020) 09/08/2021   Neuropathy 06/19/2021   Gout 05/28/2021   PVD (peripheral vascular disease) ([place]) 05/28/2021   Hypertension    Chronic ankle pain (Bilateral) 07/25/2020   Chronic low back pain (1ry area of Pain) (Bilateral) (R>L) w/o sciatica 07/25/2020   Diabetic peripheral neuropathy  ([place]) 12/30/2019   Other acquired hammer toe 11/28/2019   Ankle fracture 03/23/2017   Type 2 diabetes mellitus with diabetic neuropathy, without long-term current use of insulin ([place]) 04/16/2016   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   Calculus of kidney 02/04/2015   Perianal abscess 02/04/2015   Perirectal abscess    Chronic obstructive pulmonary disease ([place]) 01/17/2013   Acid reflux 01/17/2013   HLD (hyperlipidemia) 01/17/2013   Apnea, sleep 01/17/2013   Testicular hypofunction 01/17/2013   Allergic state 01/17/2013   Adiposity 07/06/2011    Past Surgical History:  Procedure Laterality Date   APPENDECTOMY     INCISION AND DRAINAGE PERIRECTAL ABSCESS N/A 02/04/2015   Procedure: IRRIGATION AND DEBRIDEMENT PERIRECTAL ABSCESS;  Surgeon: Marlyce Huge, MD;  Location: [facility] OR[facility];  Service: General;  Laterality: N/A;   ORIF ANKLE FRACTURE Left 03/23/2017   Procedure: OPEN REDUCTION INTERNAL FIXATION (ORIF) ANKLE FRACTURE;  Surgeon: Leim Fabry, MD;  Location: [facility];  Service: Orthopedics;  Laterality: Left;   RECTAL EXAM UNDER ANESTHESIA  02/04/2015   Procedure: RECTAL EXAM UNDER ANESTHESIA;  Surgeon: Marlyce Huge, MD;  Location: [facility];  Service: General;;   SYNDESMOSIS REPAIR Left 03/23/2017   Procedure: SYNDESMOSIS REPAIR;  Surgeon: Leim Fabry, MD;  Location: [facility];  Service: Orthopedics;  Laterality: Left;       Home Medications    Prior to Admission medications   Medication Sig Start Date End Date Taking? Authorizing Provider  predniSONE (STERAPRED UNI-PAK 21 TAB) 10 MG (21) TBPK tablet Take by mouth daily. Take 6 tabs by mouth daily  for 2 days, then 5 tabs for 2 days, then 4 tabs for 2 days, then 3 tabs for 2 days, 2 tabs for 2 days, then 1 tab by mouth daily for 2 days 12/08/21  Yes Rodriguez-Southworth, Sunday Spillers, PA-C  Accu-Chek Softclix Lancets lancets 3 (three) times daily. 09/30/20   [provider]  acetaminophen (TYLENOL) 500 MG tablet Take 2 tablets  (1,000 mg total) by mouth every 8 (eight) hours. 03/24/17   Reche Dixon, PA-C  amitriptyline (ELAVIL) 10 MG tablet Take 30 mg by mouth at bedtime.    [provider]  ascorbic acid (VITAMIN C) 500 MG tablet Take by mouth daily.    [provider]  BD DISP NEEDLES 22G X 1-1/2" MISC every 14 (fourteen) days. 09/09/20   [provider]  doxycycline (VIBRAMYCIN) 100 MG capsule Take 1 capsule (100 mg total) by mouth 2 (two) times daily. 12/08/21   Rodriguez-Southworth, Sunday Spillers, PA-C  ezetimibe (ZETIA) 10 MG tablet Take 10 mg by mouth daily. 11/04/21   [provider]  ferrous sulfate 325 (65 FE) MG tablet Take 325 mg by mouth daily with breakfast.    [provider]  fluticasone (FLONASE) 50 MCG/ACT nasal spray Place into both nostrils. 07/02/21   [provider]  gabapentin (NEURONTIN) 600 MG tablet Take 600 mg by mouth 3 (three) times daily. 03/26/21   [provider]  hydrochlorothiazide (HYDRODIURIL) 25 MG tablet Take 25 mg by mouth daily. 02/26/17   [provider]  HYDROcodone-acetaminophen (NORCO/VICODIN) 5-325 MG tablet Take 1 tablet by mouth 2 (two) times daily as needed. Must last 30 days. 11/28/21 12/28/21  Milinda Pointer, MD  HYDROcodone-acetaminophen (NORCO/VICODIN) 5-325 MG tablet Take 1 tablet by mouth 2 (two) times daily as needed. Must last 30 days. 12/28/21 01/27/22  Milinda Pointer, MD  insulin isophane & regular human (HUMULIN 70/30 KWIKPEN) (70-30) 100 UNIT/ML KwikPen Inject 58 Units into the skin 2 (two) times daily with a meal. 58 with meals and 54 qhs 10/14/19   [provider]  lisinopril (PRINIVIL,ZESTRIL) 40 MG tablet Take 40 mg by mouth daily. 02/26/17   [provider]  metFORMIN (GLUCOPHAGE-XR) 500 MG 24 hr tablet Take 4 tablets (2,000 mg total) by mouth daily. 10/24/21   Jon Billings, NP  metoprolol tartrate (LOPRESSOR) 100 MG tablet Take 1 tablet (100 mg total) by mouth once for 1  dose. Please take one time dose '100mg'$  metoprolol tartrate 2 hr prior to cardiac CT for HR control IF HR >55bpm. 06/26/21 11/26/21  Kate Sable, MD  montelukast (SINGULAIR) 10 MG tablet Take 10 mg by mouth daily. 07/02/21   [provider]  NEEDLE, DISP, 18 G (B-D BLUNT FILL NEEDLE) 18G X 1-1/2" MISC Use 18G needle to draw testosterone for injection 10/15/20   Billey Co, MD  pantoprazole (PROTONIX) 40 MG tablet Take 2 tablets (80 mg total) by mouth daily. 11/06/21   Jon Billings, NP  PROAIR HFA 108 [phone] Base) MCG/ACT inhaler Inhale 2 puffs into the lungs every 6 (six) hours as needed. 12/28/20   [provider]  rosuvastatin (CRESTOR) 40 MG tablet Take 40 mg by mouth daily. 09/30/20   [provider]  SYRINGE-NEEDLE, DISP, 3 ML (BD ECLIPSE SYRINGE) 21G X 1" 3 ML MISC Use to administer testosterone 10/15/20   Billey Co, MD  testosterone cypionate (DEPOTESTOSTERONE CYPIONATE) 200 MG/ML injection  Inject 0.5 mLs (100 mg total) into the muscle every 14 (fourteen) days. 11/10/21   Zara Council A, PA-C  TRELEGY ELLIPTA 200-62.5-25 MCG/INH AEPB Inhale 1 puff into the lungs daily. 10/10/20   [provider]  triamcinolone cream (KENALOG) 0.1 % Apply 1 application  topically 2 (two) times daily. 08/25/21   Jon Billings, NP  TRULICITY 3 NK/5.3ZJ SOPN Inject 3 mg into the skin once a week. 10/28/21   [provider]    Family History Family History  Problem Relation Age of Onset   Cancer Mother 67       Lung   Cancer Father        Colon   Heart disease Father    Alcohol abuse Father    Cancer Brother 69       Esophageal   Diabetes Brother    Heart disease Brother     Social History Social History   Tobacco Use   Smoking status: Every Day    Packs/day: 1.00    Years: 34.00    Total pack years: 34.00    Types: Cigarettes   Smokeless tobacco: Never   Tobacco comments:    patient using Nicoderm patches  Vaping Use   Vaping Use:  Never used  Substance Use Topics   Alcohol use: Not Currently    Alcohol/week: 12.0 standard drinks of alcohol    Types: 12 Cans of beer per week    Comment: Quit February 2023   Drug use: No     Allergies   Glipizide, Cephalexin, and Duloxetine   Review of Systems Review of Systems  Constitutional:  Positive for diaphoresis and fatigue. Negative for activity change, appetite change and fever.  HENT:  Positive for congestion, postnasal drip and rhinorrhea. Negative for ear discharge, ear pain and sore throat.   Respiratory:  Positive for cough, shortness of breath and wheezing. Negative for chest tightness.   Cardiovascular:  Negative for chest pain.  Musculoskeletal:  Negative for myalgias.  Neurological:  Negative for headaches.     Physical Exam Triage Vital Signs ED Triage Vitals [12/08/21 1739]  Enc Vitals Group     BP (!) 135/101     Pulse Rate (!) 102     Resp 16     Temp 98.2 F (36.8 C)     Temp Source Oral     SpO2 98 %     Weight      Height      Head Circumference      Peak Flow      Pain Score      Pain Loc      Pain Edu?      Excl. in [place]?    No data found.  Updated Vital Signs BP (!) 135/101 (BP Location: Left Arm)   Pulse (!) 102   Temp 98.2 F (36.8 C) (Oral)   Resp 16   SpO2 98%   Visual Acuity Right Eye Distance:   Left Eye Distance:   Bilateral Distance:    Right Eye Near:   Left Eye Near:    Bilateral Near:      Physical Exam Constitutional:      General: He is not in acute distress.    Appearance: He is not toxic-appearing.  HENT:     Head: Normocephalic.     Right Ear: Tympanic membrane, ear canal and external ear normal.     Left Ear: Ear canal and external ear normal.  Nose: Nose normal.     Mouth/Throat:     Mouth: Mucous membranes are moist.     Pharynx: Oropharynx is clear.  Eyes:     General: No scleral icterus.    Conjunctiva/sclera: Conjunctivae normal.  Cardiovascular:     Rate and Rhythm: Normal rate  and regular rhythm.     Heart sounds: No murmur heard.   Pulmonary:     Effort: Pulmonary effort is normal. No respiratory distress.     Breath sounds: Wheezing present.     Comments: Has auditory wheezing Musculoskeletal:        General: Normal range of motion.     Cervical back: Neck supple.  Lymphadenopathy:     Cervical: No cervical adenopathy.  Skin:    General: Skin is warm and dry.     Findings: No rash.  Neurological:     Mental Status: He is alert and oriented to person, place, and time.     Gait: Gait normal.  Psychiatric:        Mood and Affect: Mood normal.        Behavior: Behavior normal.        Thought Content: Thought content normal.        Judgment: Judgment normal.    UC Treatments / Results  Labs (all labs ordered are listed, but only abnormal results are displayed) Labs Reviewed  SARS CORONAVIRUS 2 BY RT PCR  Covid test negative  EKG   Radiology No results found.  Procedures Procedures (including critical care time)  Medications Ordered in UC Medications - No data to display  Initial Impression / Assessment and Plan / UC Course  I have reviewed the triage vital signs and the nursing notes.  Pertinent labs results that were available during my care of the patient were reviewed by me and considered in my medical decision making (see chart for details).  COPD exacerbation Acute bronchitis  I placed him on Prednisone and gave him 7 more days as Doxy as noted. He is to use his nebulizer with Albuterol q 6h.    Final Clinical Impressions(s) / UC Diagnoses  COPD exhilaration Acute bronchitis Final diagnoses:  Bronchitis  COPD exacerbation ([place])     Discharge Instructions      I will call you when the covid test is back if positive      ED Prescriptions     Medication Sig Dispense Auth. Provider   doxycycline (VIBRAMYCIN) 100 MG capsule  (Status: Discontinued) Take 1 capsule (100 mg total) by mouth 2 (two) times daily. 20 capsule  Rodriguez-Southworth, Sunday Spillers, PA-C   predniSONE (STERAPRED UNI-PAK 21 TAB) 10 MG (21) TBPK tablet Take by mouth daily. Take 6 tabs by mouth daily  for 2 days, then 5 tabs for 2 days, then 4 tabs for 2 days, then 3 tabs for 2 days, 2 tabs for 2 days, then 1 tab by mouth daily for 2 days 42 tablet Rodriguez-Southworth, Sunday Spillers, PA-C   doxycycline (VIBRAMYCIN) 100 MG capsule Take 1 capsule (100 mg total) by mouth 2 (two) times daily. 14 capsule Rodriguez-Southworth, Sunday Spillers, PA-C      PDMP not reviewed this encounter.   Shelby Mattocks, Hershal Coria 12/08/21 1936

## 2021-12-08 NOTE — Discharge Instructions (Signed)
I will call you when the covid test is back if positive

## 2021-12-08 NOTE — ED Triage Notes (Signed)
Patient presents to UC for cough and nasal congestion x 9 days. He states he started doxy today. Hx of lung infections.   Denies fever.

## 2021-12-12 ENCOUNTER — Other Ambulatory Visit: Payer: Self-pay | Admitting: Urology

## 2021-12-12 DIAGNOSIS — E291 Testicular hypofunction: Secondary | ICD-10-CM

## 2021-12-15 ENCOUNTER — Other Ambulatory Visit: Payer: Self-pay | Admitting: Nurse Practitioner

## 2021-12-16 ENCOUNTER — Ambulatory Visit
Admission: RE | Admit: 2021-12-16 | Discharge: 2021-12-16 | Disposition: A | Discharge status: Home / Self Care | Payer: HMO | Source: Ambulatory Visit | Attending: Pain Medicine | Admitting: Pain Medicine

## 2021-12-16 ENCOUNTER — Other Ambulatory Visit: Payer: Self-pay | Admitting: Nurse Practitioner

## 2021-12-16 ENCOUNTER — Ambulatory Visit: Payer: HMO | Attending: Pain Medicine | Admitting: Pain Medicine

## 2021-12-16 ENCOUNTER — Encounter: Payer: Self-pay | Admitting: Pain Medicine

## 2021-12-16 VITALS — BP 118/85 | HR 98 | Temp 97.9°F | Resp 17 | Ht 74.0 in | Wt 248.0 lb

## 2021-12-16 DIAGNOSIS — M4807 Spinal stenosis, lumbosacral region: Secondary | ICD-10-CM | POA: Diagnosis present

## 2021-12-16 DIAGNOSIS — M79671 Pain in right foot: Secondary | ICD-10-CM | POA: Diagnosis present

## 2021-12-16 DIAGNOSIS — M545 Low back pain, unspecified: Secondary | ICD-10-CM | POA: Diagnosis present

## 2021-12-16 DIAGNOSIS — M5137 Other intervertebral disc degeneration, lumbosacral region: Secondary | ICD-10-CM | POA: Diagnosis present

## 2021-12-16 DIAGNOSIS — M5417 Radiculopathy, lumbosacral region: Secondary | ICD-10-CM | POA: Insufficient documentation

## 2021-12-16 DIAGNOSIS — R937 Abnormal findings on diagnostic imaging of other parts of musculoskeletal system: Secondary | ICD-10-CM | POA: Diagnosis present

## 2021-12-16 DIAGNOSIS — M79672 Pain in left foot: Secondary | ICD-10-CM | POA: Insufficient documentation

## 2021-12-16 DIAGNOSIS — G8929 Other chronic pain: Secondary | ICD-10-CM | POA: Diagnosis present

## 2021-12-16 DIAGNOSIS — R94130 Abnormal response to nerve stimulation, unspecified: Secondary | ICD-10-CM | POA: Diagnosis present

## 2021-12-16 MED ORDER — LIDOCAINE HCL 2 % IJ SOLN
INTRAMUSCULAR | Status: AC
  Filled 2021-12-16: qty 20

## 2021-12-16 MED ORDER — ROPIVACAINE HCL 2 MG/ML IJ SOLN
2.0000 mL | Freq: Once | INTRAMUSCULAR | Status: AC
  Administered 2021-12-16: 2 mL via EPIDURAL

## 2021-12-16 MED ORDER — IOHEXOL 180 MG/ML  SOLN
INTRAMUSCULAR | Status: AC
  Filled 2021-12-16: qty 20

## 2021-12-16 MED ORDER — TRIAMCINOLONE ACETONIDE 40 MG/ML IJ SUSP
INTRAMUSCULAR | Status: AC
  Filled 2021-12-16: qty 1

## 2021-12-16 MED ORDER — SODIUM CHLORIDE 0.9% FLUSH
2.0000 mL | Freq: Once | INTRAVENOUS | Status: AC
  Administered 2021-12-16: 2 mL

## 2021-12-16 MED ORDER — ROPIVACAINE HCL 2 MG/ML IJ SOLN
INTRAMUSCULAR | Status: AC
  Filled 2021-12-16: qty 20

## 2021-12-16 MED ORDER — LIDOCAINE HCL 2 % IJ SOLN
20.0000 mL | Freq: Once | INTRAMUSCULAR | Status: AC
  Administered 2021-12-16: 400 mg

## 2021-12-16 MED ORDER — IOHEXOL 180 MG/ML  SOLN
10.0000 mL | Freq: Once | INTRAMUSCULAR | Status: AC
  Administered 2021-12-16: 10 mL via EPIDURAL

## 2021-12-16 MED ORDER — MIDAZOLAM HCL 5 MG/5ML IJ SOLN
INTRAMUSCULAR | Status: AC
  Filled 2021-12-16: qty 5

## 2021-12-16 MED ORDER — PENTAFLUOROPROP-TETRAFLUOROETH EX AERO
INHALATION_SPRAY | Freq: Once | CUTANEOUS | Status: AC
  Administered 2021-12-16: 30 via TOPICAL
  Filled 2021-12-16: qty 116

## 2021-12-16 MED ORDER — TRIAMCINOLONE ACETONIDE 40 MG/ML IJ SUSP
40.0000 mg | Freq: Once | INTRAMUSCULAR | Status: AC
  Administered 2021-12-16: 40 mg

## 2021-12-16 MED ORDER — SODIUM CHLORIDE (PF) 0.9 % IJ SOLN
INTRAMUSCULAR | Status: AC
  Filled 2021-12-16: qty 10

## 2021-12-16 NOTE — Telephone Encounter (Signed)
Requested Prescriptions  Pending Prescriptions Disp Refills  . metFORMIN (GLUCOPHAGE-XR) 500 MG 24 hr tablet [Pharmacy Med Name: METFORMIN ER 500MG 24HR TABS] 90 tablet 1    Sig: TAKE 4 TABLETS(2000 MG) BY MOUTH DAILY     Endocrinology:  Diabetes - Biguanides Passed - 12/15/2021 11:02 AM      Passed - Cr in normal range and within 360 days    Creatinine  Date Value Ref Range Status  03/13/2014 1.08 0.60 - 1.30 mg/dL Final   Creatinine, Ser  Date Value Ref Range Status  10/23/2021 1.22 0.76 - 1.27 mg/dL Final         Passed - HBA1C is between 0 and 7.9 and within 180 days    Hgb A1c MFr Bld  Date Value Ref Range Status  10/23/2021 7.8 (H) 4.8 - 5.6 % Final    Comment:             Prediabetes: 5.7 - 6.4          Diabetes: >6.4          Glycemic control for adults with diabetes: <7.0          Passed - eGFR in normal range and within 360 days    EGFR (African American)  Date Value Ref Range Status  03/13/2014 >60 >44m/min Final  03/15/2012 >60  Final   GFR calc Af Amer  Date Value Ref Range Status  12/07/2019 59 (L) >60 mL/min Final   EGFR (Non-African Amer.)  Date Value Ref Range Status  03/13/2014 >60 >636mmin Final    Comment:    eGFR values <6029min/1.73 m2 may be an indication of chronic kidney disease (CKD). Calculated eGFR, using the MRDR Study equation, is useful in  patients with stable renal function. The eGFR calculation will not be reliable in acutely ill patients when serum creatinine is changing rapidly. It is not useful in patients on dialysis. The eGFR calculation may not be applicable to patients at the low and high extremes of body sizes, pregnant women, and vegetarians.   03/15/2012 >60  Final    Comment:    eGFR values <14m79mn/1.73 m2 may be an indication of chronic kidney disease (CKD). Calculated eGFR is useful in patients with stable renal function. The eGFR calculation will not be reliable in acutely ill patients when serum creatinine  is changing rapidly. It is not useful in  patients on dialysis. The eGFR calculation may not be applicable to patients at the low and high extremes of body sizes, pregnant women, and vegetarians.    GFR, Estimated  Date Value Ref Range Status  05/30/2021 >60 >60 mL/min Final    Comment:    (NOTE) Calculated using the CKD-EPI Creatinine Equation (2021)    eGFR  Date Value Ref Range Status  10/23/2021 69 >59 mL/min/1.73 Final         Passed - B12 Level in normal range and within 720 days    Vitamin B-12  Date Value Ref Range Status  09/08/2021 391 180 - 914 pg/mL Final    Comment:    (NOTE) This assay is not validated for testing neonatal or myeloproliferative syndrome specimens for Vitamin B12 levels. Performed at [facility] [address] [id number]      Passed - Valid encounter within last 6 months    Recent Outpatient Visits          1 month ago Annual physical exam   [facility] Jon Billings, NP   3 months ago Wound of left lower extremity, initial encounter   [place], NP   4 months ago Primary hypertension   [facility] Jon Billings, NP      Future Appointments            In 3 weeks [place], [facility] Payment [place]   In 4 months Jon Billings, NP [facility]           Passed - CBC within normal limits and completed in the last 12 months    WBC  Date Value Ref Range Status  10/23/2021 6.2 3.4 - 10.8 x10E3/uL Final  05/30/2021 6.7 4.0 - 10.5 K/uL Final   RBC  Date Value Ref Range Status  10/23/2021 4.64 4.14 - 5.80 x10E6/uL Final  05/30/2021 4.78 4.22 - 5.81 MIL/uL Final   Hemoglobin  Date Value Ref Range Status  10/23/2021 13.2 13.0 - 17.7 g/dL Final   Hematocrit  Date Value Ref Range Status  10/23/2021 40.0 37.5 - 51.0 % Final   MCHC  Date Value Ref Range Status  10/23/2021 33.0 31.5 - 35.7 g/dL  Final  05/30/2021 31.8 30.0 - 36.0 g/dL Final   [facility]  Date Value Ref Range Status  10/23/2021 28.4 26.6 - 33.0 pg Final  05/30/2021 27.2 26.0 - 34.0 pg Final   MCV  Date Value Ref Range Status  10/23/2021 86 79 - 97 fL Final  03/13/2014 83 80 - 100 fL Final   No results found for: "PLTCOUNTKUC", "LABPLAT", "POCPLA" RDW  Date Value Ref Range Status  10/23/2021 13.7 11.6 - 15.4 % Final  03/13/2014 15.2 (H) 11.5 - 14.5 % Final

## 2021-12-16 NOTE — Progress Notes (Signed)
Safety precautions to be maintained throughout the outpatient stay will include: orient to surroundings, keep bed in low position, maintain call bell within reach at all times, provide assistance with transfer out of bed and ambulation.  

## 2021-12-16 NOTE — Telephone Encounter (Signed)
Requested Prescriptions  Pending Prescriptions Disp Refills  . metFORMIN (GLUCOPHAGE-XR) 500 MG 24 hr tablet [Pharmacy Med Name: METFORMIN ER 500MG 24HR TABS] 90 tablet 1    Sig: TAKE 4 TABLETS(2000 MG) BY MOUTH DAILY     Endocrinology:  Diabetes - Biguanides Passed - 12/15/2021 11:03 AM      Passed - Cr in normal range and within 360 days    Creatinine  Date Value Ref Range Status  03/13/2014 1.08 0.60 - 1.30 mg/dL Final   Creatinine, Ser  Date Value Ref Range Status  10/23/2021 1.22 0.76 - 1.27 mg/dL Final         Passed - HBA1C is between 0 and 7.9 and within 180 days    Hgb A1c MFr Bld  Date Value Ref Range Status  10/23/2021 7.8 (H) 4.8 - 5.6 % Final    Comment:             Prediabetes: 5.7 - 6.4          Diabetes: >6.4          Glycemic control for adults with diabetes: <7.0          Passed - eGFR in normal range and within 360 days    EGFR (African American)  Date Value Ref Range Status  03/13/2014 >60 >70m/min Final  03/15/2012 >60  Final   GFR calc Af Amer  Date Value Ref Range Status  12/07/2019 59 (L) >60 mL/min Final   EGFR (Non-African Amer.)  Date Value Ref Range Status  03/13/2014 >60 >678mmin Final    Comment:    eGFR values <6013min/1.73 m2 may be an indication of chronic kidney disease (CKD). Calculated eGFR, using the MRDR Study equation, is useful in  patients with stable renal function. The eGFR calculation will not be reliable in acutely ill patients when serum creatinine is changing rapidly. It is not useful in patients on dialysis. The eGFR calculation may not be applicable to patients at the low and high extremes of body sizes, pregnant women, and vegetarians.   03/15/2012 >60  Final    Comment:    eGFR values <86m9mn/1.73 m2 may be an indication of chronic kidney disease (CKD). Calculated eGFR is useful in patients with stable renal function. The eGFR calculation will not be reliable in acutely ill patients when serum creatinine  is changing rapidly. It is not useful in  patients on dialysis. The eGFR calculation may not be applicable to patients at the low and high extremes of body sizes, pregnant women, and vegetarians.    GFR, Estimated  Date Value Ref Range Status  05/30/2021 >60 >60 mL/min Final    Comment:    (NOTE) Calculated using the CKD-EPI Creatinine Equation (2021)    eGFR  Date Value Ref Range Status  10/23/2021 69 >59 mL/min/1.73 Final         Passed - B12 Level in normal range and within 720 days    Vitamin B-12  Date Value Ref Range Status  09/08/2021 391 180 - 914 pg/mL Final    Comment:    (NOTE) This assay is not validated for testing neonatal or myeloproliferative syndrome specimens for Vitamin B12 levels. Performed at [facility] [address] [phone]      Passed - Valid encounter within last 6 months    Recent Outpatient Visits          1 month ago Annual physical exam   [facility] Jon Billings, NP   3 months ago Wound of left lower extremity, initial encounter   [place], NP   4 months ago Primary hypertension   [facility] Jon Billings, NP      Future Appointments            In 3 weeks [place], Gordan Payment [facility]   In 4 months Jon Billings, NP [facility]           Passed - CBC within normal limits and completed in the last 12 months    WBC  Date Value Ref Range Status  10/23/2021 6.2 3.4 - 10.8 x10E3/uL Final  05/30/2021 6.7 4.0 - 10.5 K/uL Final   RBC  Date Value Ref Range Status  10/23/2021 4.64 4.14 - 5.80 x10E6/uL Final  05/30/2021 4.78 4.22 - 5.81 MIL/uL Final   Hemoglobin  Date Value Ref Range Status  10/23/2021 13.2 13.0 - 17.7 g/dL Final   Hematocrit  Date Value Ref Range Status  10/23/2021 40.0 37.5 - 51.0 % Final   MCHC  Date Value Ref Range Status  10/23/2021 33.0 31.5 - 35.7 g/dL  Final  05/30/2021 31.8 30.0 - 36.0 g/dL Final   [facility]  Date Value Ref Range Status  10/23/2021 28.4 26.6 - 33.0 pg Final  05/30/2021 27.2 26.0 - 34.0 pg Final   MCV  Date Value Ref Range Status  10/23/2021 86 79 - 97 fL Final  03/13/2014 83 80 - 100 fL Final   No results found for: "PLTCOUNTKUC", "LABPLAT", "POCPLA" RDW  Date Value Ref Range Status  10/23/2021 13.7 11.6 - 15.4 % Final  03/13/2014 15.2 (H) 11.5 - 14.5 % Final

## 2021-12-16 NOTE — Patient Instructions (Signed)
____________________________________________________________________________________________  Virtual Visits   What is a "Virtual Visit"? It is a healthcare communication encounter (medical visit) that takes place on real time (NOT TEXT or E-MAIL) over the telephone or computer device (desktop, laptop, tablet, smart phone, etc.). It allows for more location flexibility between the patient and the healthcare provider.  Who decides when these types of visits will be used? The physician.  Who is eligible for these types of visits? Only those patients that can be reliably reached over the telephone.  What do you mean by reliably? We do not have time to call everyone multiple times, therefore those that tend to screen calls and then call back later are not suitable candidates for this system. We understand how people are reluctant to pickup on "unknown" calls, therefore, we suggest adding our telephone numbers to your list of "CONTACT(s)". This way, you should be able to readily identify our calls when you receive one. All of our numbers are available below.   Who is not eligible? This option is not available for medication management encounters, specially for controlled substances. Patients on pain medications that fall under the category of controlled substances have to come in for "Face-to-Face" encounters. This is required for mandatory monitoring of these substances. You may be asked to provide a sample for an unannounced urine drug screening test (UDS), and we will need to count your pain pills. Not bringing your pills to be counted may result in no refill. Obviously, neither one of these can be done over the phone.  When will this type of visits be used? You can request a virtual visit whenever you are physically unable to attend a regular appointment. The decision will be made by the physician (or healthcare provider) on a case by case basis.   At what time will I be called? This is an  excellent question. The providers will try to call you whenever they have time available. Do not expect to be called at any specific time. The secretaries will assign you a time for your virtual visit appointment, but this is done simply to keep a list of those patients that need to be called, but not for the purpose of keeping a time schedule. Be advised that the call may come in anytime during the day, between the hours of 8:00 AM and 8::00 PM, depending on provider availability. We do understand that the system is not perfect. If you are unable to be available that day on a moments notice, then request an "in-person" appointment rather than a "virtual visit".  Can I request my medication visits to be "Virtual"? Yes you may request it, but the decision is entirely up to the healthcare provider. Control substances require specific monitoring that requires Face-to-Face encounters. The number of encounters  and the extent of the monitoring is determined on a case by case basis.  Add a new contact to your smart phone and label it "PAIN CLINIC" Under this contact add the following numbers: Main: [phone] (Official Contact Number) Nurses: [phone] (These are outgoing only calling systems. Do not call this number.) Dr. Marabelle Cushman: [phone] or [phone] (Outgoing calls only. Do not call this number.)  ____________________________________________________________________________________________    ____________________________________________________________________________________________  Post-Procedure Discharge Instructions  Instructions: Apply ice:  Purpose: This will minimize any swelling and discomfort after procedure.  When: Day of procedure, as soon as you get home. How: Fill a plastic sandwich bag with crushed ice. Cover it with a small towel and apply   to injection site. How long: (15 min on, 15 min off) Apply for 15 minutes then remove x 15 minutes.  Repeat sequence on  day of procedure, until you go to bed. Apply heat:  Purpose: To treat any soreness and discomfort from the procedure. When: Starting the next day after the procedure. How: Apply heat to procedure site starting the day following the procedure. How long: May continue to repeat daily, until discomfort goes away. Food intake: Start with clear liquids (like water) and advance to regular food, as tolerated.  Physical activities: Keep activities to a minimum for the first 8 hours after the procedure. After that, then as tolerated. Driving: If you have received any sedation, be responsible and do not drive. You are not allowed to drive for 24 hours after having sedation. Blood thinner: (Applies only to those taking blood thinners) You may restart your blood thinner 6 hours after your procedure. Insulin: (Applies only to Diabetic patients taking insulin) As soon as you can eat, you may resume your normal dosing schedule. Infection prevention: Keep procedure site clean and dry. Shower daily and clean area with soap and water. Post-procedure Pain Diary: Extremely important that this be done correctly and accurately. Recorded information will be used to determine the next step in treatment. For the purpose of accuracy, follow these rules: Evaluate only the area treated. Do not report or include pain from an untreated area. For the purpose of this evaluation, ignore all other areas of pain, except for the treated area. After your procedure, avoid taking a long nap and attempting to complete the pain diary after you wake up. Instead, set your alarm clock to go off every hour, on the hour, for the initial 8 hours after the procedure. Document the duration of the numbing medicine, and the relief you are getting from it. Do not go to sleep and attempt to complete it later. It will not be accurate. If you received sedation, it is likely that you were given a medication that may cause amnesia. Because of this,  completing the diary at a later time may cause the information to be inaccurate. This information is needed to plan your care. Follow-up appointment: Keep your post-procedure follow-up evaluation appointment after the procedure (usually 2 weeks for most procedures, 6 weeks for radiofrequencies). DO NOT FORGET to bring you pain diary with you.   Expect: (What should I expect to see with my procedure?) From numbing medicine (AKA: Local Anesthetics): Numbness or decrease in pain. You may also experience some weakness, which if present, could last for the duration of the local anesthetic. Onset: Full effect within 15 minutes of injected. Duration: It will depend on the type of local anesthetic used. On the average, 1 to 8 hours.  From steroids (Applies only if steroids were used): Decrease in swelling or inflammation. Once inflammation is improved, relief of the pain will follow. Onset of benefits: Depends on the amount of swelling present. The more swelling, the longer it will take for the benefits to be seen. In some cases, up to 10 days. Duration: Steroids will stay in the system x 2 weeks. Duration of benefits will depend on multiple posibilities including persistent irritating factors. Side-effects: If present, they may typically last 2 weeks (the duration of the steroids). Frequent: Cramps (if they occur, drink Gatorade and take over-the-counter Magnesium 450-500 mg once to twice a day); water retention with temporary weight gain; increases in blood sugar; decreased immune system response; increased appetite. Occasional: Facial flushing (  red, warm cheeks); mood swings; menstrual changes. Uncommon: Long-term decrease or suppression of natural hormones; bone thinning. (These are more common with higher doses or more frequent use. This is why we prefer that our patients avoid having any injection therapies in other practices.)  Very Rare: Severe mood changes; psychosis; aseptic necrosis. From  procedure: Some discomfort is to be expected once the numbing medicine wears off. This should be minimal if ice and heat are applied as instructed.  Call if: (When should I call?) You experience numbness and weakness that gets worse with time, as opposed to wearing off. New onset bowel or bladder incontinence. (Applies only to procedures done in the spine)  Emergency Numbers: Durning business hours (Monday - Thursday, 8:00 AM - 4:00 PM) (Friday, 9:00 AM - 12:00 Noon): [phone] After hours: [phone] NOTE: If you are having a problem and are unable connect with, or to talk to a provider, then go to your nearest urgent care or emergency department. If the problem is serious and urgent, please call 911. ____________________________________________________________________________________________    

## 2021-12-16 NOTE — Telephone Encounter (Signed)
Requested medications are due for refill today.  Unsure  Requested medications are on the active medications list.  yes  Last refill. 12/16/2021 #90 1 rf  Future visit scheduled.   yes  Notes to clinic.  Rx was refilled 12/16/2021. Pt is requesting a 90 day supply. Pt is to take 4 tablets daily.  A 1 month supply is 120 tablets.  Please advise.    Requested Prescriptions  Pending Prescriptions Disp Refills   metFORMIN (GLUCOPHAGE-XR) 500 MG 24 hr tablet [Pharmacy Med Name: METFORMIN ER 500MG 24HR TABS] 368 tablet     Sig: TAKE 4 TABLETS(2000 MG) BY MOUTH DAILY     Endocrinology:  Diabetes - Biguanides Passed - 12/16/2021  9:20 AM      Passed - Cr in normal range and within 360 days    Creatinine  Date Value Ref Range Status  03/13/2014 1.08 0.60 - 1.30 mg/dL Final   Creatinine, Ser  Date Value Ref Range Status  10/23/2021 1.22 0.76 - 1.27 mg/dL Final         Passed - HBA1C is between 0 and 7.9 and within 180 days    Hgb A1c MFr Bld  Date Value Ref Range Status  10/23/2021 7.8 (H) 4.8 - 5.6 % Final    Comment:             Prediabetes: 5.7 - 6.4          Diabetes: >6.4          Glycemic control for adults with diabetes: <7.0          Passed - eGFR in normal range and within 360 days    EGFR (African American)  Date Value Ref Range Status  03/13/2014 >60 >46m/min Final  03/15/2012 >60  Final   GFR calc Af Amer  Date Value Ref Range Status  12/07/2019 59 (L) >60 mL/min Final   EGFR (Non-African Amer.)  Date Value Ref Range Status  03/13/2014 >60 >623mmin Final    Comment:    eGFR values <6064min/1.73 m2 may be an indication of chronic kidney disease (CKD). Calculated eGFR, using the MRDR Study equation, is useful in  patients with stable renal function. The eGFR calculation will not be reliable in acutely ill patients when serum creatinine is changing rapidly. It is not useful in patients on dialysis. The eGFR calculation may not be applicable to patients at  the low and high extremes of body sizes, pregnant women, and vegetarians.   03/15/2012 >60  Final    Comment:    eGFR values <93m23mn/1.73 m2 may be an indication of chronic kidney disease (CKD). Calculated eGFR is useful in patients with stable renal function. The eGFR calculation will not be reliable in acutely ill patients when serum creatinine is changing rapidly. It is not useful in  patients on dialysis. The eGFR calculation may not be applicable to patients at the low and high extremes of body sizes, pregnant women, and vegetarians.    GFR, Estimated  Date Value Ref Range Status  05/30/2021 >60 >60 mL/min Final    Comment:    (NOTE) Calculated using the CKD-EPI Creatinine Equation (2021)    eGFR  Date Value Ref Range Status  10/23/2021 69 >59 mL/min/1.73 Final         Passed - B12 Level in normal range and within 720 days    Vitamin B-12  Date Value Ref Range Status  09/08/2021 391 180 - 914 pg/mL Final    Comment:    (  NOTE) This assay is not validated for testing neonatal or myeloproliferative syndrome specimens for Vitamin B12 levels. Performed at [facility], [address] [place], [place] [address]          Passed - Valid encounter within last 6 months    Recent Outpatient Visits           1 month ago Annual physical exam   [facility] Jon Billings, NP   3 months ago Wound of left lower extremity, initial encounter   [place], NP   4 months ago Primary hypertension   [facility] Jon Billings, NP       Future Appointments             In 3 weeks [facility], [facility] [place]   In 4 months Jon Billings, NP [facility]            Passed - CBC within normal limits and completed in the last 12 months    WBC  Date Value Ref Range Status  10/23/2021 6.2 3.4 - 10.8 x10E3/uL Final  05/30/2021 6.7 4.0 - 10.5 K/uL Final    RBC  Date Value Ref Range Status  10/23/2021 4.64 4.14 - 5.80 x10E6/uL Final  05/30/2021 4.78 4.22 - 5.81 MIL/uL Final   Hemoglobin  Date Value Ref Range Status  10/23/2021 13.2 13.0 - 17.7 g/dL Final   Hematocrit  Date Value Ref Range Status  10/23/2021 40.0 37.5 - 51.0 % Final   MCHC  Date Value Ref Range Status  10/23/2021 33.0 31.5 - 35.7 g/dL Final  05/30/2021 31.8 30.0 - 36.0 g/dL Final   [facility]  Date Value Ref Range Status  10/23/2021 28.4 26.6 - 33.0 pg Final  05/30/2021 27.2 26.0 - 34.0 pg Final   MCV  Date Value Ref Range Status  10/23/2021 86 79 - 97 fL Final  03/13/2014 83 80 - 100 fL Final   No results found for: "PLTCOUNTKUC", "LABPLAT", "POCPLA" RDW  Date Value Ref Range Status  10/23/2021 13.7 11.6 - 15.4 % Final  03/13/2014 15.2 (H) 11.5 - 14.5 % Final

## 2021-12-16 NOTE — Progress Notes (Signed)
PROVIDER NOTE: Interpretation of information contained herein should be left to medically-trained personnel. Specific patient instructions are provided elsewhere under "Patient Instructions" section of medical record. This document was created in part using STT-dictation technology, any transcriptional errors that may result from this process are unintentional.  Patient: Darren Allen Type: Established DOB: 1965-09-17 MRN: [id number] PCP: Jon Billings, NP  Service: Procedure DOS: 12/16/2021 Setting: Ambulatory Location: Ambulatory outpatient facility Delivery: Face-to-face Provider: Gaspar Cola, MD Specialty: Interventional Pain Management Specialty designation: 09 Location: Outpatient facility Ref. Prov.: Jon Billings, NP    Primary Reason for Visit: Interventional Pain Management Treatment. CC: Back Pain (low)   Procedure:           Type: Lumbar epidural steroid injection (LESI) (interlaminar) #1    Laterality: Left   Level:  L5-S1 Level.  Imaging: Fluoroscopic guidance         Anesthesia: Local anesthesia (1-2% Lidocaine) Anxiolysis: None                 Sedation: No Sedation                       DOS: 12/16/2021  Performed by: Gaspar Cola, MD  Purpose: Diagnostic/Therapeutic Indications: Lumbar radicular pain of intraspinal etiology of more than 4 weeks that has failed to respond to conservative therapy and is severe enough to impact quality of life or function. 1. Lumbosacral radiculopathy at S1 (Left)   2. Lumbosacral lateral recess stenosis (Left: L5-S1)   3. DDD (degenerative disc disease), lumbosacral   4. Chronic low back pain (1ry area of Pain) (Bilateral) (R>L) w/o sciatica   5. Chronic feet pain (2ry area of Pain) (Bilateral)   6. Abnormal MRI, lumbar spine (03/11/2020)   7. Abnormal NCS (nerve conduction studies) (02/20/2020)    NAS-11 Pain score:   Pre-procedure: 3 /10   Post-procedure: 0-No pain/10      Position / Prep / Materials:   Position: Prone w/ head of the table raised (slight reverse trendelenburg) to facilitate breathing.  Prep solution: DuraPrep (Iodine Povacrylex [0.7% available iodine] and Isopropyl Alcohol, 74% w/w) Prep Area: Entire Posterior Lumbar Region from lower scapular tip down to mid buttocks area and from flank to flank. Materials:  Tray: Epidural tray Needle(s):  Type: Epidural needle (Tuohy) Gauge (G):  17 Length: Regular (3.5-in) Qty: 1  Pre-op H&P Assessment:  Darren Allen is a 56 y.o. (year old), male patient, seen today for interventional treatment. He  has a past surgical history that includes Appendectomy; Incision and drainage perirectal abscess (N/A, 02/04/2015); Rectal exam under anesthesia (02/04/2015); ORIF ankle fracture (Left, 03/23/2017); and Syndesmosis repair (Left, 03/23/2017). Darren Allen has a current medication list which includes the following prescription(s): accu-chek softclix lancets, acetaminophen, amitriptyline, ascorbic acid, bd disp needles, bd plastipak syringe, doxycycline, ezetimibe, ferrous sulfate, fluticasone, gabapentin, hydrochlorothiazide, hydrocodone-acetaminophen, [START ON 12/28/2021] hydrocodone-acetaminophen, humulin 70/30 kwikpen, lisinopril, metformin, montelukast, b-d blunt fill needle, pantoprazole, prednisone, proair hfa, rosuvastatin, testosterone cypionate, trelegy ellipta, triamcinolone cream, trulicity, and metoprolol tartrate. His primarily concern today is the Back Pain (low)  Initial Vital Signs:  Pulse/HCG Rate: 100  Temp: 97.9 F (36.6 C) Resp: 16 BP: 122/74 SpO2: 98 %  BMI: Estimated body mass index is 31.84 kg/m as calculated from the following:   Height as of this encounter: '6\' 2"'$  (1.88 m).   Weight as of this encounter: 248 lb (112.5 kg).  Risk Assessment: Allergies: Reviewed. He is allergic to glipizide, cephalexin, and duloxetine.  Allergy Precautions: None required Coagulopathies: Reviewed. None identified.  Blood-thinner therapy:  None at this time Active Infection(s): Reviewed. None identified. Darren Allen is afebrile  Site Confirmation: Darren Allen was asked to confirm the procedure and laterality before marking the site Procedure checklist: Completed Consent: Before the procedure and under the influence of no sedative(s), amnesic(s), or anxiolytics, the patient was informed of the treatment options, risks and possible complications. To fulfill our ethical and legal obligations, as recommended by the American Medical Association's Code of Ethics, I have informed the patient of my clinical impression; the nature and purpose of the treatment or procedure; the risks, benefits, and possible complications of the intervention; the alternatives, including doing nothing; the risk(s) and benefit(s) of the alternative treatment(s) or procedure(s); and the risk(s) and benefit(s) of doing nothing. The patient was provided information about the general risks and possible complications associated with the procedure. These may include, but are not limited to: failure to achieve desired goals, infection, bleeding, organ or nerve damage, allergic reactions, paralysis, and death. In addition, the patient was informed of those risks and complications associated to Spine-related procedures, such as failure to decrease pain; infection (i.e.: Meningitis, epidural or intraspinal abscess); bleeding (i.e.: epidural hematoma, subarachnoid hemorrhage, or any other type of intraspinal or peri-dural bleeding); organ or nerve damage (i.e.: Any type of peripheral nerve, nerve root, or spinal cord injury) with subsequent damage to sensory, motor, and/or autonomic systems, resulting in permanent pain, numbness, and/or weakness of one or several areas of the body; allergic reactions; (i.e.: anaphylactic reaction); and/or death. Furthermore, the patient was informed of those risks and complications associated with the medications. These include, but are not limited to:  allergic reactions (i.e.: anaphylactic or anaphylactoid reaction(s)); adrenal axis suppression; blood sugar elevation that in diabetics may result in ketoacidosis or comma; water retention that in patients with history of congestive heart failure may result in shortness of breath, pulmonary edema, and decompensation with resultant heart failure; weight gain; swelling or edema; medication-induced neural toxicity; particulate matter embolism and blood vessel occlusion with resultant organ, and/or nervous system infarction; and/or aseptic necrosis of one or more joints. Finally, the patient was informed that Medicine is not an exact science; therefore, there is also the possibility of unforeseen or unpredictable risks and/or possible complications that may result in a catastrophic outcome. The patient indicated having understood very clearly. We have given the patient no guarantees and we have made no promises. Enough time was given to the patient to ask questions, all of which were answered to the patient's satisfaction. Mr. Staver has indicated that he wanted to continue with the procedure. Attestation: I, the ordering provider, attest that I have discussed with the patient the benefits, risks, side-effects, alternatives, likelihood of achieving goals, and potential problems during recovery for the procedure that I have provided informed consent. Date  Time: 12/16/2021  8:53 AM  Pre-Procedure Preparation:  Monitoring: As per clinic protocol. Respiration, ETCO2, SpO2, BP, heart rate and rhythm monitor placed and checked for adequate function Safety Precautions: Patient was assessed for positional comfort and pressure points before starting the procedure. Time-out: I initiated and conducted the "Time-out" before starting the procedure, as per protocol. The patient was asked to participate by confirming the accuracy of the "Time Out" information. Verification of the correct person, site, and procedure were  performed and confirmed by me, the nursing staff, and the patient. "Time-out" conducted as per Joint Commission's Universal Protocol (UP.01.01.01). Time: 0928  Description/Narrative of Procedure:  Target: Epidural space via interlaminar opening, initially targeting the lower laminar border of the superior vertebral body. Region: Lumbar Approach: Percutaneous paravertebral  Rationale (medical necessity): procedure needed and proper for the diagnosis and/or treatment of the patient's medical symptoms and needs. Procedural Technique Safety Precautions: Aspiration looking for blood return was conducted prior to all injections. At no point did we inject any substances, as a needle was being advanced. No attempts were made at seeking any paresthesias. Safe injection practices and needle disposal techniques used. Medications properly checked for expiration dates. SDV (single dose vial) medications used. Description of the Procedure: Protocol guidelines were followed. The procedure needle was introduced through the skin, ipsilateral to the reported pain, and advanced to the target area. Bone was contacted and the needle walked caudad, until the lamina was cleared. The epidural space was identified using "loss-of-resistance technique" with 2-3 ml of PF-NaCl (0.9% NSS), in a 5cc LOR glass syringe.  Vitals:   12/16/21 0851 12/16/21 0924 12/16/21 0932 12/16/21 0940  BP: 122/74 117/87 120/86 118/85  Pulse: 100 (!) 102 97 98  Resp: '16 15 16 17  '$ Temp: 97.9 F (36.6 C)     SpO2: 98% 98% 97% 98%  Weight: 248 lb (112.5 kg)     Height: '6\' 2"'$  (1.88 m)       Start Time: 0928 hrs. End Time: 0934 hrs.  Imaging Guidance (Spinal):          Type of Imaging Technique: Fluoroscopy Guidance (Spinal) Indication(s): Assistance in needle guidance and placement for procedures requiring needle placement in or near specific anatomical locations not easily accessible without such assistance. Exposure Time: Please  see nurses notes. Contrast: Before injecting any contrast, we confirmed that the patient did not have an allergy to iodine, shellfish, or radiological contrast. Once satisfactory needle placement was completed at the desired level, radiological contrast was injected. Contrast injected under live fluoroscopy. No contrast complications. See chart for type and volume of contrast used. Fluoroscopic Guidance: I was personally present during the use of fluoroscopy. "Tunnel Vision Technique" used to obtain the best possible view of the target area. Parallax error corrected before commencing the procedure. "Direction-depth-direction" technique used to introduce the needle under continuous pulsed fluoroscopy. Once target was reached, antero-posterior, oblique, and lateral fluoroscopic projection used confirm needle placement in all planes. Images permanently stored in EMR. Interpretation: I personally interpreted the imaging intraoperatively. Adequate needle placement confirmed in multiple planes. Appropriate spread of contrast into desired area was observed. No evidence of afferent or efferent intravascular uptake. No intrathecal or subarachnoid spread observed. Permanent images saved into the patient's record.  Antibiotic Prophylaxis:   Anti-infectives (From admission, onward)    None      Indication(s): None identified  Post-operative Assessment:  Post-procedure Vital Signs:  Pulse/HCG Rate: 98  Temp: 97.9 F (36.6 C) Resp: 17 BP: 118/85 SpO2: 98 %  EBL: None  Complications: No immediate post-treatment complications observed by team, or reported by patient.  Note: The patient tolerated the entire procedure well. A repeat set of vitals were taken after the procedure and the patient was kept under observation following institutional policy, for this type of procedure. Post-procedural neurological assessment was performed, showing return to baseline, prior to discharge. The patient was provided  with post-procedure discharge instructions, including a section on how to identify potential problems. Should any problems arise concerning this procedure, the patient was given instructions to immediately contact us, at any time, without hesitation. In any case, we plan to contact  the patient by telephone for a follow-up status report regarding this interventional procedure.  Comments:  No additional relevant information.  Plan of Care  Orders:  Orders Placed This Encounter  Procedures   Lumbar Epidural Injection    Scheduling Instructions:     Procedure: Interlaminar LESI L5-S1     Laterality: Left     Sedation: Patient's choice     Timeframe: Today    Order Specific Question:   Where will this procedure be performed?    Answer:   [facility]   DG [facility] C-ARM 1-60 MIN NO REPORT    Intraoperative interpretation by procedural physician at [place].    Standing Status:   Standing    Number of Occurrences:   1    Order Specific Question:   Reason for exam:    Answer:   Assistance in needle guidance and placement for procedures requiring needle placement in or near specific anatomical locations not easily accessible without such assistance.   Informed Consent Details: Physician/Practitioner Attestation; Transcribe to consent form and obtain patient signature    Note: Always confirm laterality of pain with Mr. Whitenack, before procedure. Transcribe to consent form and obtain patient signature.    Order Specific Question:   Physician/Practitioner attestation of informed consent for procedure/surgical case    Answer:   I, the physician/practitioner, attest that I have discussed with the patient the benefits, risks, side effects, alternatives, likelihood of achieving goals and potential problems during recovery for the procedure that I have provided informed consent.    Order Specific Question:   Procedure    Answer:   Lumbar epidural steroid injection under  fluoroscopic guidance    Order Specific Question:   Physician/Practitioner performing the procedure    Answer:   Linsay Vogt A. Dossie Arbour, MD    Order Specific Question:   Indication/Reason    Answer:   Low back and/or lower extremity pain secondary to lumbar radiculitis   Provide equipment / supplies at bedside    "Epidural Tray" (Disposable  single use) Catheter: NOT required    Standing Status:   Standing    Number of Occurrences:   1    Order Specific Question:   Specify    Answer:   Epidural Tray   Chronic Opioid Analgesic:  Hydrocodone/APAP 5/325 tablet, 1 tab p.o. twice daily (#60) MME/day: 10 mg/day   Medications ordered for procedure: Meds ordered this encounter  Medications   iohexol (OMNIPAQUE) 180 MG/ML injection 10 mL    Must be Myelogram-compatible. If not available, you may substitute with a water-soluble, non-ionic, hypoallergenic, myelogram-compatible radiological contrast medium.   lidocaine (XYLOCAINE) 2 % (with pres) injection 400 mg   pentafluoroprop-tetrafluoroeth (GEBAUERS) aerosol   sodium chloride flush (NS) 0.9 % injection 2 mL   ropivacaine (PF) 2 mg/mL (0.2%) (NAROPIN) injection 2 mL   triamcinolone acetonide (KENALOG-40) injection 40 mg   Medications administered: We administered iohexol, lidocaine, pentafluoroprop-tetrafluoroeth, sodium chloride flush, ropivacaine (PF) 2 mg/mL (0.2%), and triamcinolone acetonide.  See the medical record for exact dosing, route, and time of administration.  Follow-up plan:   Return for Proc-day (T,Th), (F2F), (PPE), scheduled encounter.       Interventional Therapies  Risk  Complexity Considerations:   Estimated body mass index is 33.91 kg/m as calculated from the following:   Height as of this encounter: 6' (1.829 m).   Weight as of this encounter: 250 lb (113.4 kg). WNL   Planned  Pending:   Diagnostic/therapeutic left  L5-S1 LESI #1  Therapeutic bilateral Qutenza treatment #1    Under consideration:    Therapeutic bilateral Qutenza treatment #1  Diagnostic bilateral lumbar facet MBB #1  Diagnostic left L5-S1 LESI #1  Diagnostic/therapeutic left L5 & S1 TFESI #1  Possible spinal cord stimulator trial  Therapeutic left L5-S1 percutaneous discectomy with "[employer] Dekompressor" system    Completed:   (09/08/2021 & 10/29/2021) referral to physical therapy for evaluation and treatment of low back pain.   Completed by other providers:   EMG/PNCV of lower extremity (02/20/2020) by Dr. Jennings Books (generalized sensorimotor peripheral neuropathy; superimposed left S1 radiculopathy)   Therapeutic  Palliative (PRN) options:   None established       Recent Visits Date Type Provider Dept  11/26/21 Office Visit Milinda Pointer, MD [facility]  10/29/21 Office Visit Milinda Pointer, MD [facility]  Showing recent visits within past 90 days and meeting all other requirements Today's Visits Date Type Provider Dept  12/16/21 Procedure visit Milinda Pointer, MD [facility]  Showing today's visits and meeting all other requirements Future Appointments Date Type Provider Dept  12/23/21 Appointment Milinda Pointer, MD [facility]  01/01/22 Appointment Milinda Pointer, MD [facility]  01/13/22 Appointment Milinda Pointer, MD [facility]  Showing future appointments within next 90 days and meeting all other requirements  Disposition: Discharge home  Discharge (Date  Time): 12/16/2021; 0942 hrs.   Primary Care Physician: Jon Billings, NP Location: [facility] Note by: Gaspar Cola, MD Date: 12/16/2021; Time: 9:57 AM  Disclaimer:  Medicine is not an Chief Strategy Officer. The only guarantee in medicine is that nothing is guaranteed. It is important to note that the decision to proceed with this intervention was based on the information collected from the patient. The Data and conclusions  were drawn from the patient's questionnaire, the interview, and the physical examination. Because the information was provided in large part by the patient, it cannot be guaranteed that it has not been purposely or unconsciously manipulated. Every effort has been made to obtain as much relevant data as possible for this evaluation. It is important to note that the conclusions that lead to this procedure are derived in large part from the available data. Always take into account that the treatment will also be dependent on availability of resources and existing treatment guidelines, considered by other Pain Management Practitioners as being common knowledge and practice, at the time of the intervention. For Medico-Legal purposes, it is also important to point out that variation in procedural techniques and pharmacological choices are the acceptable norm. The indications, contraindications, technique, and results of the above procedure should only be interpreted and judged by a Board-Certified Interventional Pain Specialist with extensive familiarity and expertise in the same exact procedure and technique.

## 2021-12-17 ENCOUNTER — Telehealth: Payer: Self-pay | Admitting: *Deleted

## 2021-12-17 NOTE — Telephone Encounter (Signed)
No problems post procedure. 

## 2021-12-21 NOTE — Progress Notes (Unsigned)
PROVIDER NOTE: Interpretation of information contained herein should be left to medically-trained personnel. Specific patient instructions are provided elsewhere under "Patient Instructions" section of medical record. This document was created in part using STT-dictation technology, any transcriptional errors that may result from this process are unintentional.  Patient: Darren Allen Type: Established DOB: November 20, 1965 MRN: [id number] PCP: Jon Billings, NP  Service: Procedure DOS: 12/23/2021 Setting: Ambulatory Location: Ambulatory outpatient facility Delivery: Face-to-face Provider: Gaspar Cola, MD Specialty: Interventional Pain Management Specialty designation: 09 Location: Outpatient facility Ref. Prov.: Jon Billings, NP    Primary Reason for Visit: Interventional Pain Management Treatment. CC: No chief complaint on file.  Interventional Treatment:          Procedure: Qutenza Neurolysis #1  Laterality:  Bilateral Area treated: Feet Imaging Guidance: None Anesthesia/analgesia/anxiolysis/sedation: None required Medication (Right): Qutenza patches Medication (Left): Qutenza patches Date: 12/23/2021 Performed by: Gaspar Cola, MD No diagnosis found. NAS-11 Pain score:   Pre-procedure:  /10   Post-procedure:  /10     Position / Prep / Materials:  Position: Supine  Materials: Qutenza Kit   Post-procedure evaluation   Type: Lumbar epidural steroid injection (LESI) (interlaminar) #1    Laterality: Left   Level:  L5-S1 Level.  Imaging: Fluoroscopic guidance         Anesthesia: Local anesthesia (1-2% Lidocaine) Anxiolysis: None                 Sedation: No Sedation                       DOS: 12/16/2021  Performed by: Gaspar Cola, MD  Purpose: Diagnostic/Therapeutic Indications: Lumbar radicular pain of intraspinal etiology of more than 4 weeks that has failed to respond to conservative therapy and is severe enough to impact quality of life  or function. 1. Lumbosacral radiculopathy at S1 (Left)   2. Lumbosacral lateral recess stenosis (Left: L5-S1)   3. DDD (degenerative disc disease), lumbosacral   4. Chronic low back pain (1ry area of Pain) (Bilateral) (R>L) w/o sciatica   5. Chronic feet pain (2ry area of Pain) (Bilateral)   6. Abnormal MRI, lumbar spine (03/11/2020)   7. Abnormal NCS (nerve conduction studies) (02/20/2020)    NAS-11 Pain score:   Pre-procedure: 3 /10   Post-procedure: 0-No pain/10      Effectiveness:  Initial hour after procedure:   ***. Subsequent 4-6 hours post-procedure:   ***. Analgesia past initial 6 hours:   ***. Ongoing improvement:  Analgesic:  *** Function:    ***    ROM:    ***     Pre-op H&P Assessment:  Mr. Janssen is a 56 y.o. (year old), male patient, seen today for interventional treatment. He  has a past surgical history that includes Appendectomy; Incision and drainage perirectal abscess (N/A, 02/04/2015); Rectal exam under anesthesia (02/04/2015); ORIF ankle fracture (Left, 03/23/2017); and Syndesmosis repair (Left, 03/23/2017). Mr. Bennison has a current medication list which includes the following prescription(s): accu-chek softclix lancets, acetaminophen, amitriptyline, ascorbic acid, bd disp needles, bd plastipak syringe, doxycycline, ezetimibe, ferrous sulfate, fluticasone, gabapentin, hydrochlorothiazide, hydrocodone-acetaminophen, [START ON 12/28/2021] hydrocodone-acetaminophen, humulin 70/30 kwikpen, lisinopril, metformin, metoprolol tartrate, montelukast, b-d blunt fill needle, pantoprazole, prednisone, proair hfa, rosuvastatin, testosterone cypionate, trelegy ellipta, triamcinolone cream, and trulicity. His primarily concern today is the No chief complaint on file.  Initial Vital Signs:  Pulse/HCG Rate:    Temp:   Resp:   BP:   SpO2:    BMI: Estimated  body mass index is 31.84 kg/m as calculated from the following:   Height as of 12/16/21: $RemoveBef'6\' 2"'bQqVEBMrRx$  (1.88 m).   Weight as of 12/16/21:  248 lb (112.5 kg).  Risk Assessment: Allergies: Reviewed. He is allergic to glipizide, cephalexin, and duloxetine.  Allergy Precautions: None required Coagulopathies: Reviewed. None identified.  Blood-thinner therapy: None at this time Active Infection(s): Reviewed. None identified. Mr. Birks is afebrile  Site Confirmation: Mr. Mattix was asked to confirm the procedure and laterality before marking the site Procedure checklist: Completed Consent: Before the procedure and under the influence of no sedative(s), amnesic(s), or anxiolytics, the patient was informed of the treatment options, risks and possible complications. To fulfill our ethical and legal obligations, as recommended by the American Medical Association's Code of Ethics, I have informed the patient of my clinical impression; the nature and purpose of the treatment or procedure; the risks, benefits, and possible complications of the intervention; the alternatives, including doing nothing; the risk(s) and benefit(s) of the alternative treatment(s) or procedure(s); and the risk(s) and benefit(s) of doing nothing. The patient was provided information about the general risks and possible complications associated with the procedure. These may include, but are not limited to: failure to achieve desired goals, infection, bleeding, organ or nerve damage, allergic reactions, paralysis, and death. In addition, the patient was informed of those risks and complications associated to the procedure, such as failure to decrease pain; infection; bleeding; organ or nerve damage with subsequent damage to sensory, motor, and/or autonomic systems, resulting in permanent pain, numbness, and/or weakness of one or several areas of the body; allergic reactions; (i.e.: anaphylactic reaction); and/or death. Furthermore, the patient was informed of those risks and complications associated with the medications. These include, but are not limited to: allergic reactions  (i.e.: anaphylactic or anaphylactoid reaction(s)); adrenal axis suppression; blood sugar elevation that in diabetics may result in ketoacidosis or comma; water retention that in patients with history of congestive heart failure may result in shortness of breath, pulmonary edema, and decompensation with resultant heart failure; weight gain; swelling or edema; medication-induced neural toxicity; particulate matter embolism and blood vessel occlusion with resultant organ, and/or nervous system infarction; and/or aseptic necrosis of one or more joints. Finally, the patient was informed that Medicine is not an exact science; therefore, there is also the possibility of unforeseen or unpredictable risks and/or possible complications that may result in a catastrophic outcome. The patient indicated having understood very clearly. We have given the patient no guarantees and we have made no promises. Enough time was given to the patient to ask questions, all of which were answered to the patient's satisfaction. Mr. Mundie has indicated that he wanted to continue with the procedure. Attestation: I, the ordering provider, attest that I have discussed with the patient the benefits, risks, side-effects, alternatives, likelihood of achieving goals, and potential problems during recovery for the procedure that I have provided informed consent. Date  Time: {CHL [facility] TIME CHOICES:21018001}  Pre-Procedure Preparation:  Monitoring: As per clinic protocol. Respiration, ETCO2, SpO2, BP, heart rate and rhythm monitor placed and checked for adequate function Safety Precautions: Patient was assessed for positional comfort and pressure points before starting the procedure. Time-out: I initiated and conducted the "Time-out" before starting the procedure, as per protocol. The patient was asked to participate by confirming the accuracy of the "Time Out" information. Verification of the correct person, site, and procedure were  performed and confirmed by me, the nursing staff, and the patient. "Time-out" conducted as per  Joint Commission's Universal Protocol (UP.01.01.01). 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 The patient was assisted into a comfortable position.  Informed consent was obtained in the patient monitored in the usual manner.  All questions were answered prior to the procedure.  They Qutenza patches were applied to the affected area and then covered with the wrap.  The Patient was kept under observation until the treatment was completed.  The patches were removed and the treated area was inspected.  There were no vitals filed for this visit.   Start Time:   hrs. End Time:   hrs. Imaging Guidance:          Type of Imaging Technique: None used Indication(s): N/A Exposure Time: No patient exposure Contrast: None used. Fluoroscopic Guidance: N/A Ultrasound Guidance: N/A Interpretation: N/A  Post-operative Assessment:  Post-procedure Vital Signs:  Pulse/HCG Rate:    Temp:   Resp:   BP:   SpO2:    EBL: None  Complications: No immediate post-treatment complications observed by team, or reported by patient.  Note: The patient tolerated the entire procedure well. A repeat set of vitals were taken after the procedure and the patient was kept under observation following institutional policy, for this type of procedure. Post-procedural neurological assessment was performed, showing return to baseline, prior to discharge. The patient was provided with post-procedure discharge instructions, including a section on how to identify potential problems. Should any problems arise  concerning this procedure, the patient was given instructions to immediately contact us, at any time, without hesitation. In any case, we plan to contact the patient by telephone for a follow-up status report regarding this interventional procedure.  Comments:  No additional relevant information.  Plan of Care  Orders:  No orders of the defined types were placed in this encounter.  Chronic Opioid Analgesic:  Hydrocodone/APAP 5/325 tablet, 1 tab p.o. twice daily (#60) MME/day: 10 mg/day   Medications ordered for procedure: No orders of the defined types were placed in this encounter.  Medications administered: Laurens A. Lecrone had no medications administered during this visit.  See the medical record for exact dosing, route, and time of administration.  Follow-up plan:   No follow-ups on file.       Interventional Therapies  Risk  Complexity Considerations:   Estimated body mass index is 33.91 kg/m as calculated from the following:   Height as of this encounter: 6' (1.829 m).   Weight as of this encounter: 250 lb (113.4 kg). WNL   Planned  Pending:   Diagnostic/therapeutic left L5-S1 LESI #1  Therapeutic bilateral Qutenza treatment #1    Under consideration:   Therapeutic bilateral Qutenza treatment #1  Diagnostic bilateral lumbar facet MBB #1  Diagnostic left L5-S1 LESI #1  Diagnostic/therapeutic left L5 & S1 TFESI #1  Possible spinal cord stimulator trial  Therapeutic left L5-S1 percutaneous discectomy with "[employer] Dekompressor" system    Completed:   (09/08/2021 & 10/29/2021) referral to physical therapy for evaluation and treatment of low back pain.   Completed by other providers:   EMG/PNCV of lower extremity (02/20/2020) by Dr. Jennings Books (generalized sensorimotor peripheral neuropathy; superimposed left S1 radiculopathy)   Therapeutic  Palliative (PRN) options:   None  established        Recent Visits Date Type Provider Dept  12/16/21 Procedure visit  Milinda Pointer, MD [facility]  11/26/21 Office Visit Milinda Pointer, MD [facility]  10/29/21 Office Visit Milinda Pointer, MD [facility]  Showing recent visits within past 90 days and meeting all other requirements Future Appointments Date Type Provider Dept  12/23/21 Appointment Milinda Pointer, MD [facility]  01/01/22 Appointment Milinda Pointer, MD [facility]  01/13/22 Appointment Milinda Pointer, MD [facility]  Showing future appointments within next 90 days and meeting all other requirements  Disposition: Discharge home  Discharge (Date  Time): 12/23/2021;   hrs.   Primary Care Physician: Jon Billings, NP Location: [facility] Note by: Gaspar Cola, MD Date: 12/23/2021; Time: 4:50 PM  Disclaimer:  Medicine is not an Chief Strategy Officer. The only guarantee in medicine is that nothing is guaranteed. It is important to note that the decision to proceed with this intervention was based on the information collected from the patient. The Data and conclusions were drawn from the patient's questionnaire, the interview, and the physical examination. Because the information was provided in large part by the patient, it cannot be guaranteed that it has not been purposely or unconsciously manipulated. Every effort has been made to obtain as much relevant data as possible for this evaluation. It is important to note that the conclusions that lead to this procedure are derived in large part from the available data. Always take into account that the treatment will also be dependent on availability of resources and existing treatment guidelines, considered by other Pain Management Practitioners as being common knowledge and practice, at the time of the intervention. For Medico-Legal purposes, it is also important to point out that variation in procedural techniques and pharmacological  choices are the acceptable norm. The indications, contraindications, technique, and results of the above procedure should only be interpreted and judged by a Board-Certified Interventional Pain Specialist with extensive familiarity and expertise in the same exact procedure and technique.

## 2021-12-22 LAB — HM DIABETES EYE EXAM

## 2021-12-23 ENCOUNTER — Ambulatory Visit: Payer: HMO | Attending: Pain Medicine | Admitting: Pain Medicine

## 2021-12-23 ENCOUNTER — Encounter: Payer: Self-pay | Admitting: Pain Medicine

## 2021-12-23 VITALS — BP 127/80 | Temp 97.8°F | Resp 18 | Ht 74.0 in | Wt 248.0 lb

## 2021-12-23 DIAGNOSIS — Z5189 Encounter for other specified aftercare: Secondary | ICD-10-CM | POA: Diagnosis present

## 2021-12-23 DIAGNOSIS — M5417 Radiculopathy, lumbosacral region: Secondary | ICD-10-CM | POA: Insufficient documentation

## 2021-12-23 DIAGNOSIS — R937 Abnormal findings on diagnostic imaging of other parts of musculoskeletal system: Secondary | ICD-10-CM | POA: Insufficient documentation

## 2021-12-23 DIAGNOSIS — M79672 Pain in left foot: Secondary | ICD-10-CM | POA: Insufficient documentation

## 2021-12-23 DIAGNOSIS — E1142 Type 2 diabetes mellitus with diabetic polyneuropathy: Secondary | ICD-10-CM | POA: Insufficient documentation

## 2021-12-23 DIAGNOSIS — G8929 Other chronic pain: Secondary | ICD-10-CM | POA: Diagnosis present

## 2021-12-23 DIAGNOSIS — M79671 Pain in right foot: Secondary | ICD-10-CM | POA: Insufficient documentation

## 2021-12-23 DIAGNOSIS — G629 Polyneuropathy, unspecified: Secondary | ICD-10-CM | POA: Insufficient documentation

## 2021-12-23 DIAGNOSIS — R94130 Abnormal response to nerve stimulation, unspecified: Secondary | ICD-10-CM | POA: Diagnosis present

## 2021-12-23 MED ORDER — CAPSAICIN-CLEANSING GEL 8 % EX KIT
1.0000 | PACK | Freq: Once | CUTANEOUS | Status: AC
  Administered 2021-12-23: 1 via TOPICAL
  Filled 2021-12-23: qty 1

## 2021-12-23 NOTE — Patient Instructions (Signed)

## 2021-12-23 NOTE — Progress Notes (Signed)
Safety precautions to be maintained throughout the outpatient stay will include: orient to surroundings, keep bed in low position, maintain call bell within reach at all times, provide assistance with transfer out of bed and ambulation.  

## 2021-12-24 ENCOUNTER — Telehealth: Payer: Self-pay | Admitting: *Deleted

## 2021-12-24 MED FILL — Capsaicin Patch 8% & Cleansing Gel Kit: CUTANEOUS | Qty: 4 | Status: AC

## 2021-12-24 NOTE — Telephone Encounter (Signed)
Attempted to call for post Qutenza application. Message left.

## 2021-12-25 ENCOUNTER — Ambulatory Visit (INDEPENDENT_AMBULATORY_CARE_PROVIDER_SITE_OTHER): Payer: HMO | Admitting: Gastroenterology

## 2021-12-25 ENCOUNTER — Other Ambulatory Visit: Payer: Self-pay

## 2021-12-25 ENCOUNTER — Encounter: Payer: Self-pay | Admitting: Gastroenterology

## 2021-12-25 VITALS — BP 131/66 | HR 109 | Temp 99.0°F | Ht 74.0 in | Wt 242.8 lb

## 2021-12-25 DIAGNOSIS — Z1211 Encounter for screening for malignant neoplasm of colon: Secondary | ICD-10-CM

## 2021-12-25 DIAGNOSIS — K449 Diaphragmatic hernia without obstruction or gangrene: Secondary | ICD-10-CM

## 2021-12-25 MED ORDER — NA SULFATE-K SULFATE-MG SULF 17.5-3.13-1.6 GM/177ML PO SOLN: 354.0000 mL | Freq: Once | ORAL | 0 refills | Status: AC

## 2021-12-25 NOTE — Progress Notes (Signed)
Darren Bellows MD, MRCP(U.K) [address]  [place]  [place], [place] [address]  Main: [phone]  Fax: [phone]   Gastroenterology Consultation  Referring Provider:     Jon Billings, NP Primary Care Physician:  Jon Billings, NP Primary Gastroenterologist:  Dr. Jonathon Allen  Reason for Consultation:     GERD, colon cancer screening         HPI:   Darren Allen is a 56 y.o. y/o male referred for consultation & management  by Jon Billings, NP.    He says he is here today to set up for an upper endoscopy preoperatively prior to repair of a hiatal hernia by Dr. Dahlia Byes.  He is on pantoprazole 40 mg twice daily he has regurgitation at night and hence requesting surgery.  He has a colonoscopy every 5 years due to prior history of colon polyps.  He states he is due for no other GI symptoms.  He is not on any blood thinners    Past Medical History:  Diagnosis Date   Allergy    Calculus of kidney 02/04/2015   COPD (chronic obstructive pulmonary disease) (HCC)    Diabetes mellitus without complication ([place])    type 2   Hypertension    Neuropathy     Past Surgical History:  Procedure Laterality Date   APPENDECTOMY     INCISION AND DRAINAGE PERIRECTAL ABSCESS N/A 02/04/2015   Procedure: IRRIGATION AND DEBRIDEMENT PERIRECTAL ABSCESS;  Surgeon: Marlyce Huge, MD;  Location: [facility];  Service: General;  Laterality: N/A;   ORIF ANKLE FRACTURE Left 03/23/2017   Procedure: OPEN REDUCTION INTERNAL FIXATION (ORIF) ANKLE FRACTURE;  Surgeon: Leim Fabry, MD;  Location: [facility];  Service: Orthopedics;  Laterality: Left;   RECTAL EXAM UNDER ANESTHESIA  02/04/2015   Procedure: RECTAL EXAM UNDER ANESTHESIA;  Surgeon: Marlyce Huge, MD;  Location: [facility];  Service: General;;   SYNDESMOSIS REPAIR Left 03/23/2017   Procedure: SYNDESMOSIS REPAIR;  Surgeon: Leim Fabry, MD;  Location: [facility];  Service: Orthopedics;  Laterality: Left;    Prior to Admission  medications   Medication Sig Start Date End Date Taking? Authorizing Provider  Accu-Chek Softclix Lancets lancets 3 (three) times daily. 09/30/20   [provider]  acetaminophen (TYLENOL) 500 MG tablet Take 2 tablets (1,000 mg total) by mouth every 8 (eight) hours. 03/24/17   Reche Dixon, PA-C  amitriptyline (ELAVIL) 10 MG tablet Take 30 mg by mouth at bedtime.    [provider]  ascorbic acid (VITAMIN C) 500 MG tablet Take by mouth daily.    [provider]  BD DISP NEEDLES 22G X 1-1/2" MISC every 14 (fourteen) days. 09/09/20   [provider]  BD PLASTIPAK SYRINGE 21G X 1" 3 ML MISC USE AS DIRECTED 12/12/21   McGowan, Larene [place] A, PA-C  doxycycline (VIBRAMYCIN) 100 MG capsule Take 1 capsule (100 mg total) by mouth 2 (two) times daily. 12/08/21   Rodriguez-Southworth, Sunday Spillers, PA-C  ezetimibe (ZETIA) 10 MG tablet Take 10 mg by mouth daily. 11/04/21   [provider]  ferrous sulfate 325 (65 FE) MG tablet Take 325 mg by mouth daily with breakfast.    [provider]  fluticasone (FLONASE) 50 MCG/ACT nasal spray Place into both nostrils. 07/02/21   [provider]  gabapentin (NEURONTIN) 600 MG tablet Take 600 mg by mouth 3 (three) times daily. 03/26/21   [provider]  hydrochlorothiazide (HYDRODIURIL) 25 MG tablet Take 25 mg by mouth daily. 02/26/17  [provider]  HYDROcodone-acetaminophen (NORCO/VICODIN) 5-325 MG tablet Take 1 tablet by mouth 2 (two) times daily as needed. Must last 30 days. 11/28/21 12/28/21  Milinda Pointer, MD  HYDROcodone-acetaminophen (NORCO/VICODIN) 5-325 MG tablet Take 1 tablet by mouth 2 (two) times daily as needed. Must last 30 days. 12/28/21 01/27/22  Milinda Pointer, MD  insulin isophane & regular human (HUMULIN 70/30 KWIKPEN) (70-30) 100 UNIT/ML KwikPen Inject 58 Units into the skin 2 (two) times daily with a meal. 58 with meals and 54 qhs 10/14/19   [provider]  lisinopril  (PRINIVIL,ZESTRIL) 40 MG tablet Take 40 mg by mouth daily. 02/26/17   [provider]  metFORMIN (GLUCOPHAGE-XR) 500 MG 24 hr tablet TAKE 4 TABLETS(2000 MG) BY MOUTH DAILY 12/16/21   Jon Billings, NP  metoprolol tartrate (LOPRESSOR) 100 MG tablet Take 1 tablet (100 mg total) by mouth once for 1 dose. Please take one time dose '100mg'$  metoprolol tartrate 2 hr prior to cardiac CT for HR control IF HR >55bpm. 06/26/21 11/26/21  Kate Sable, MD  montelukast (SINGULAIR) 10 MG tablet Take 10 mg by mouth daily. 07/02/21   [provider]  NEEDLE, DISP, 18 G (B-D BLUNT FILL NEEDLE) 18G X 1-1/2" MISC Use 18G needle to draw testosterone for injection 10/15/20   Billey Co, MD  pantoprazole (PROTONIX) 40 MG tablet Take 2 tablets (80 mg total) by mouth daily. 11/06/21   Jon Billings, NP  predniSONE (STERAPRED UNI-PAK 21 TAB) 10 MG (21) TBPK tablet Take by mouth daily. Take 6 tabs by mouth daily  for 2 days, then 5 tabs for 2 days, then 4 tabs for 2 days, then 3 tabs for 2 days, 2 tabs for 2 days, then 1 tab by mouth daily for 2 days 12/08/21   Rodriguez-Southworth, Sunday Spillers, PA-C  PROAIR HFA 108 (90 Base) MCG/ACT inhaler Inhale 2 puffs into the lungs every 6 (six) hours as needed. 12/28/20   [provider]  rosuvastatin (CRESTOR) 40 MG tablet Take 40 mg by mouth daily. 09/30/20   [provider]  testosterone cypionate (DEPOTESTOSTERONE CYPIONATE) 200 MG/ML injection Inject 0.5 mLs (100 mg total) into the muscle every 14 (fourteen) days. 11/10/21   Zara Council A, PA-C  TRELEGY ELLIPTA 200-62.5-25 MCG/INH AEPB Inhale 1 puff into the lungs daily. 10/10/20   [provider]  triamcinolone cream (KENALOG) 0.1 % Apply 1 application  topically 2 (two) times daily. 08/25/21   Jon Billings, NP  TRULICITY 3 LP/3.7TK SOPN Inject 3 mg into the skin once a week. 10/28/21   [provider]    Family History  Problem Relation Age of Onset   Cancer Mother  60       Lung   Cancer Father        Colon   Heart disease Father    Alcohol abuse Father    Cancer Brother 71       Esophageal   Diabetes Brother    Heart disease Brother      Social History   Tobacco Use   Smoking status: Every Day    Packs/day: 1.00    Years: 34.00    Total pack years: 34.00    Types: Cigarettes   Smokeless tobacco: Never   Tobacco comments:    patient using Nicoderm patches  Vaping Use   Vaping Use: Never used  Substance Use Topics   Alcohol use: Not Currently    Alcohol/week: 12.0 standard drinks of alcohol    Types: 12  Cans of beer per week    Comment: Quit February 2023   Drug use: No    Allergies as of 12/25/2021 - Review Complete 12/23/2021  Allergen Reaction Noted   Glipizide Other (See Comments) and Palpitations 12/04/2016   Cephalexin Rash 06/13/2021   Duloxetine Anxiety and Nausea Only 04/23/2020    Review of Systems:    All systems reviewed and negative except where noted in HPI.   Physical Exam:  There were no vitals taken for this visit. No LMP for male patient. Psych:  Alert and cooperative. Normal mood and affect. General:   Alert,  Well-developed, well-nourished, pleasant and cooperative in NAD Head:  Normocephalic and atraumatic. Neurologic:  Alert and oriented x3;  grossly normal neurologically. Psych:  Alert and cooperative. Normal mood and affect.  Imaging Studies: DG PAIN CLINIC C-ARM 1-60 MIN NO REPORT  Result Date: 12/16/2021 Fluoro was used, but no Radiologist interpretation will be provided. Please refer to "NOTES" tab for provider progress note.   Assessment and Plan:   RAKAN SOFFER is a 56 y.o. y/o male has been referred for colon cancer screening with a prior history of colon polyps and evaluation prior to repair of hiatal hernia by Dr. Dahlia Byes  Plan  EGD+colonoscopy next week    I have discussed alternative options, risks & benefits,  which include, but are not limited to, bleeding, infection,  perforation,respiratory complication & drug reaction.  The patient agrees with this plan & written consent will be obtained.     Follow up in as needed  Dr Darren Bellows MD,MRCP(U.K)

## 2021-12-30 ENCOUNTER — Other Ambulatory Visit: Payer: HMO

## 2021-12-30 DIAGNOSIS — E291 Testicular hypofunction: Secondary | ICD-10-CM

## 2021-12-31 ENCOUNTER — Ambulatory Visit: Payer: HMO | Admitting: Certified Registered Nurse Anesthetist

## 2021-12-31 ENCOUNTER — Encounter
Admission: RE | Disposition: A | Discharge status: Home / Self Care | Payer: Self-pay | Source: Home / Self Care | Attending: Gastroenterology

## 2021-12-31 ENCOUNTER — Ambulatory Visit
Admission: RE | Admit: 2021-12-31 | Discharge: 2021-12-31 | Disposition: A | Discharge status: Home / Self Care | Payer: HMO | Attending: Gastroenterology | Admitting: Gastroenterology

## 2021-12-31 DIAGNOSIS — E1151 Type 2 diabetes mellitus with diabetic peripheral angiopathy without gangrene: Secondary | ICD-10-CM | POA: Diagnosis not present

## 2021-12-31 DIAGNOSIS — J449 Chronic obstructive pulmonary disease, unspecified: Secondary | ICD-10-CM | POA: Diagnosis not present

## 2021-12-31 DIAGNOSIS — D122 Benign neoplasm of ascending colon: Secondary | ICD-10-CM | POA: Diagnosis not present

## 2021-12-31 DIAGNOSIS — F17210 Nicotine dependence, cigarettes, uncomplicated: Secondary | ICD-10-CM | POA: Insufficient documentation

## 2021-12-31 DIAGNOSIS — D126 Benign neoplasm of colon, unspecified: Secondary | ICD-10-CM

## 2021-12-31 DIAGNOSIS — I10 Essential (primary) hypertension: Secondary | ICD-10-CM | POA: Insufficient documentation

## 2021-12-31 DIAGNOSIS — K219 Gastro-esophageal reflux disease without esophagitis: Secondary | ICD-10-CM | POA: Diagnosis not present

## 2021-12-31 DIAGNOSIS — Z1211 Encounter for screening for malignant neoplasm of colon: Secondary | ICD-10-CM | POA: Insufficient documentation

## 2021-12-31 DIAGNOSIS — K5730 Diverticulosis of large intestine without perforation or abscess without bleeding: Secondary | ICD-10-CM | POA: Diagnosis not present

## 2021-12-31 DIAGNOSIS — K449 Diaphragmatic hernia without obstruction or gangrene: Secondary | ICD-10-CM

## 2021-12-31 DIAGNOSIS — Z794 Long term (current) use of insulin: Secondary | ICD-10-CM | POA: Diagnosis not present

## 2021-12-31 DIAGNOSIS — D124 Benign neoplasm of descending colon: Secondary | ICD-10-CM | POA: Insufficient documentation

## 2021-12-31 DIAGNOSIS — E1142 Type 2 diabetes mellitus with diabetic polyneuropathy: Secondary | ICD-10-CM | POA: Diagnosis not present

## 2021-12-31 HISTORY — PX: ESOPHAGOGASTRODUODENOSCOPY: SHX5428

## 2021-12-31 HISTORY — PX: COLONOSCOPY WITH PROPOFOL: SHX5780

## 2021-12-31 LAB — GLUCOSE, CAPILLARY: Glucose-Capillary: 136 mg/dL — ABNORMAL HIGH (ref 70–99)

## 2021-12-31 LAB — HEMOGLOBIN AND HEMATOCRIT, BLOOD
Hematocrit: 39.5 % (ref 37.5–51.0)
Hemoglobin: 12.9 g/dL — ABNORMAL LOW (ref 13.0–17.7)

## 2021-12-31 LAB — TESTOSTERONE: Testosterone: 107 ng/dL — ABNORMAL LOW (ref 264–916)

## 2021-12-31 LAB — PSA: Prostate Specific Ag, Serum: 1.2 ng/mL (ref 0.0–4.0)

## 2021-12-31 SURGERY — COLONOSCOPY WITH PROPOFOL
Anesthesia: General

## 2021-12-31 MED ORDER — SODIUM CHLORIDE 0.9 % IV SOLN
INTRAVENOUS | Status: DC
  Administered 2021-12-31: 20 mL/h via INTRAVENOUS

## 2021-12-31 MED ORDER — DEXMEDETOMIDINE HCL IN NACL 80 MCG/20ML IV SOLN
INTRAVENOUS | Status: DC | PRN
  Administered 2021-12-31: 8 ug via BUCCAL

## 2021-12-31 MED ORDER — LIDOCAINE HCL (CARDIAC) PF 100 MG/5ML IV SOSY
PREFILLED_SYRINGE | INTRAVENOUS | Status: DC | PRN
  Administered 2021-12-31: 50 mg via INTRAVENOUS

## 2021-12-31 MED ORDER — PHENYLEPHRINE 80 MCG/ML (10ML) SYRINGE FOR IV PUSH (FOR BLOOD PRESSURE SUPPORT)
PREFILLED_SYRINGE | INTRAVENOUS | Status: DC | PRN
  Administered 2021-12-31: 160 ug via INTRAVENOUS
  Administered 2021-12-31: 240 ug via INTRAVENOUS

## 2021-12-31 MED ORDER — PROPOFOL 500 MG/50ML IV EMUL
INTRAVENOUS | Status: DC | PRN
  Administered 2021-12-31: 165 ug/kg/min via INTRAVENOUS

## 2021-12-31 MED ORDER — PROPOFOL 10 MG/ML IV BOLUS
INTRAVENOUS | Status: DC | PRN
  Administered 2021-12-31: 80 mg via INTRAVENOUS

## 2021-12-31 NOTE — Anesthesia Procedure Notes (Signed)
Date/Time: 12/31/2021 8:12 AM  Performed by: Johnna Acosta, CRNAPre-anesthesia Checklist: Patient identified, Emergency Drugs available, Suction available, Patient being monitored and Timeout performed Patient Re-evaluated:Patient Re-evaluated prior to induction Oxygen Delivery Method: Nasal cannula Preoxygenation: Pre-oxygenation with 100% oxygen Induction Type: IV induction

## 2021-12-31 NOTE — Transfer of Care (Signed)
Immediate Anesthesia Transfer of Care Note  Patient: Darren Allen  Procedure(s) Performed: COLONOSCOPY WITH PROPOFOL ESOPHAGOGASTRODUODENOSCOPY (EGD)  Patient Location: PACU  Anesthesia Type:General  Level of Consciousness: awake, alert  and oriented  Airway & Oxygen Therapy: Patient Spontanous Breathing  Post-op Assessment: Report given to RN and Post -op Vital signs reviewed and stable  Post vital signs: Reviewed and stable  Last Vitals:  Vitals Value Taken Time  BP 92/62 12/31/21 0843  Temp 35.9 C 12/31/21 0840  Pulse 88 12/31/21 0843  Resp 14 12/31/21 0843  SpO2 99 % 12/31/21 0843    Last Pain:  Vitals:   12/31/21 0840  TempSrc: Temporal  PainSc: 0-No pain         Complications: No notable events documented.

## 2021-12-31 NOTE — Anesthesia Preprocedure Evaluation (Signed)
Anesthesia Evaluation  Patient identified by MRN, date of birth, ID band Patient awake    Reviewed: Allergy & Precautions, H&P , NPO status , Patient's Chart, lab work & pertinent test results, reviewed documented beta blocker date and time   Airway Mallampati: II   Neck ROM: full    Dental  (+) Poor Dentition   Pulmonary sleep apnea , COPD, Current Smoker and Patient abstained from smoking.,    Pulmonary exam normal        Cardiovascular Exercise Tolerance: Poor hypertension, On Medications + Peripheral Vascular Disease  Normal cardiovascular exam Rhythm:regular Rate:Normal     Neuro/Psych  Neuromuscular disease negative psych ROS   GI/Hepatic Neg liver ROS, hiatal hernia, GERD  Medicated,  Endo/Other  negative endocrine ROSdiabetes, Well Controlled, Insulin Dependent  Renal/GU Renal disease  negative genitourinary   Musculoskeletal   Abdominal   Peds  Hematology negative hematology ROS (+)   Anesthesia Other Findings Past Medical History: No date: Allergy 02/04/2015: Calculus of kidney No date: COPD (chronic obstructive pulmonary disease) (HCC) No date: Diabetes mellitus without complication (HCC)     Comment:  type 2 No date: Hypertension No date: Neuropathy Past Surgical History: No date: APPENDECTOMY 02/04/2015: INCISION AND DRAINAGE PERIRECTAL ABSCESS; N/A     Comment:  Procedure: IRRIGATION AND DEBRIDEMENT PERIRECTAL               ABSCESS;  Surgeon: Marlyce Huge, MD;  Location:               [facility] ORS;  Service: General;  Laterality: N/A; 03/23/2017: ORIF ANKLE FRACTURE; Left     Comment:  Procedure: OPEN REDUCTION INTERNAL FIXATION (ORIF) ANKLE              FRACTURE;  Surgeon: Leim Fabry, MD;  Location: [facility]               ORS;  Service: Orthopedics;  Laterality: Left; 02/04/2015: RECTAL EXAM UNDER ANESTHESIA     Comment:  Procedure: RECTAL EXAM UNDER ANESTHESIA;  Surgeon:                Marlyce Huge, MD;  Location: [facility] ORS;  Service:              General;; 03/23/2017: SYNDESMOSIS REPAIR; Left     Comment:  Procedure: SYNDESMOSIS REPAIR;  Surgeon: Leim Fabry,               MD;  Location: [facility] ORS;  Service: Orthopedics;                Laterality: Left; BMI    Body Mass Index: 31.84 kg/m     Reproductive/Obstetrics negative OB ROS                             Anesthesia Physical Anesthesia Plan  ASA: 3  Anesthesia Plan: General   Post-op Pain Management:    Induction:   PONV Risk Score and Plan:   Airway Management Planned:   Additional Equipment:   Intra-op Plan:   Post-operative Plan:   Informed Consent: I have reviewed the patients History and Physical, chart, labs and discussed the procedure including the risks, benefits and alternatives for the proposed anesthesia with the patient or authorized representative who has indicated his/her understanding and acceptance.     Dental Advisory Given  Plan Discussed with: CRNA  Anesthesia Plan Comments:         Anesthesia Quick Evaluation

## 2021-12-31 NOTE — Op Note (Signed)
[facility] Gastroenterology Patient Name: Darren Allen Procedure Date: 12/31/2021 8:06 AM MRN: [id number] Account #: [id number] Date of Birth: [birth year] Admit Type: Outpatient Age: 56 Room: [facility] ENDO ROOM 3 Gender: Male Note Status: Finalized Instrument Name: Upper Endoscope [id number] Procedure:             Upper GI endoscopy Indications:           Preoperative assessment Providers:             Jonathon Bellows MD, MD Referring MD:          No Local Md, MD (Referring MD) Medicines:             Monitored Anesthesia Care Complications:         No immediate complications. Procedure:             Pre-Anesthesia Assessment:                        - Prior to the procedure, a History and Physical was                         performed, and patient medications, allergies and                         sensitivities were reviewed. The patient's tolerance                         of previous anesthesia was reviewed.                        - The risks and benefits of the procedure and the                         sedation options and risks were discussed with the                         patient. All questions were answered and informed                         consent was obtained.                        - ASA Grade Assessment: II - A patient with mild                         systemic disease.                        After obtaining informed consent, the endoscope was                         passed under direct vision. Throughout the procedure,                         the patient's blood pressure, pulse, and oxygen                         saturations were monitored continuously. The Endoscope                         was  introduced through the mouth, and advanced to the                         third part of duodenum. The upper GI endoscopy was                         accomplished with ease. The patient tolerated the                         procedure well. Findings:      The examined  esophagus was normal.      The examined duodenum was normal.      A large hiatal hernia was present.      The cardia and gastric fundus were normal on retroflexion. Impression:            - Normal esophagus.                        - Normal examined duodenum.                        - Large hiatal hernia.                        - No specimens collected. Recommendation:        - Perform a colonoscopy today. Procedure Code(s):     --- Professional ---                        [phone], Esophagogastroduodenoscopy, flexible,                         transoral; diagnostic, including collection of                         specimen(s) by brushing or washing, when performed                         (separate procedure) Diagnosis Code(s):     --- Professional ---                        K44.9, Diaphragmatic hernia without obstruction or                         gangrene                        Z01.818, Encounter for other preprocedural examination CPT copyright 2019 American Medical Association. All rights reserved. The codes documented in this report are preliminary and upon coder review may  be revised to meet current compliance requirements. Jonathon Bellows, MD Jonathon Bellows MD, MD 12/31/2021 8:20:30 AM This report has been signed electronically. Number of Addenda: 0 Note Initiated On: 12/31/2021 8:06 AM Estimated Blood Loss:  Estimated blood loss: none.      [facility]

## 2021-12-31 NOTE — Op Note (Signed)
[facility] Gastroenterology Patient Name: Darren Allen Procedure Date: 12/31/2021 8:05 AM MRN: [id number] Account #: [id number] Date of Birth: June 29, 1965 Admit Type: Outpatient Age: 56 Room: [facility] ENDO ROOM 3 Gender: Male Note Status: Finalized Instrument Name: Park Meo [id number] Procedure:             Colonoscopy Indications:           Surveillance: Personal history of colonic polyps                         (unknown histology) on last colonoscopy more than 5                         years ago Providers:             Jonathon Bellows MD, MD Referring MD:          No Local Md, MD (Referring MD) Medicines:             Monitored Anesthesia Care Complications:         No immediate complications. Procedure:             Pre-Anesthesia Assessment:                        - Prior to the procedure, a History and Physical was                         performed, and patient medications, allergies and                         sensitivities were reviewed. The patient's tolerance                         of previous anesthesia was reviewed.                        - The risks and benefits of the procedure and the                         sedation options and risks were discussed with the                         patient. All questions were answered and informed                         consent was obtained.                        - ASA Grade Assessment: II - A patient with mild                         systemic disease.                        After obtaining informed consent, the colonoscope was                         passed under direct vision. Throughout the procedure,                         the patient's blood  pressure, pulse, and oxygen                         saturations were monitored continuously. The                         Colonoscope was introduced through the anus and                         advanced to the the cecum, identified by the                         appendiceal  orifice. The colonoscopy was performed                         with ease. The patient tolerated the procedure well.                         The quality of the bowel preparation was good. Findings:      The perianal and digital rectal examinations were normal.      Multiple small and large-mouthed diverticula were found in the sigmoid       colon.      Two sessile polyps were found in the descending colon. The polyps were 5       to 6 mm in size. These polyps were removed with a cold snare. Resection       and retrieval were complete.      Two sessile polyps were found in the ascending colon. The polyps were 5       to 6 mm in size. These polyps were removed with a cold snare. Resection       and retrieval were complete.      A 5 mm polyp was found in the transverse colon. The polyp was sessile.       The polyp was removed with a cold snare. Resection and retrieval were       complete.      The exam was otherwise without abnormality on direct and retroflexion       views. Impression:            - Diverticulosis in the sigmoid colon.                        - Two 5 to 6 mm polyps in the descending colon,                         removed with a cold snare. Resected and retrieved.                        - Two 5 to 6 mm polyps in the ascending colon, removed                         with a cold snare. Resected and retrieved.                        - One 5 mm polyp in the transverse colon, removed with                         a cold snare.  Resected and retrieved.                        - The examination was otherwise normal on direct and                         retroflexion views. Recommendation:        - Discharge patient to home (with escort).                        - Resume previous diet.                        - Continue present medications.                        - Await pathology results.                        - Repeat colonoscopy in 3 years for surveillance. Procedure Code(s):     ---  Professional ---                        [phone], Colonoscopy, flexible; with removal of                         tumor(s), polyp(s), or other lesion(s) by snare                         technique Diagnosis Code(s):     --- Professional ---                        K63.5, Polyp of colon                        Z86.010, Personal history of colonic polyps                        K57.30, Diverticulosis of large intestine without                         perforation or abscess without bleeding CPT copyright 2019 American Medical Association. All rights reserved. The codes documented in this report are preliminary and upon coder review may  be revised to meet current compliance requirements. Jonathon Bellows, MD Jonathon Bellows MD, MD 12/31/2021 8:41:02 AM This report has been signed electronically. Number of Addenda: 0 Note Initiated On: 12/31/2021 8:05 AM Scope Withdrawal Time: 0 hours 10 minutes 37 seconds  Total Procedure Duration: 0 hours 15 minutes 17 seconds  Estimated Blood Loss:  Estimated blood loss: none.      [facility]

## 2021-12-31 NOTE — Anesthesia Postprocedure Evaluation (Signed)
Anesthesia Post Note  Patient: Darren Allen  Procedure(s) Performed: COLONOSCOPY WITH PROPOFOL ESOPHAGOGASTRODUODENOSCOPY (EGD)  Patient location during evaluation: PACU Anesthesia Type: General Level of consciousness: awake and alert Pain management: pain level controlled Vital Signs Assessment: post-procedure vital signs reviewed and stable Respiratory status: spontaneous breathing, nonlabored ventilation, respiratory function stable and patient connected to nasal cannula oxygen Cardiovascular status: blood pressure returned to baseline and stable Postop Assessment: no apparent nausea or vomiting Anesthetic complications: no   No notable events documented.   Last Vitals:  Vitals:   12/31/21 0840 12/31/21 0843  BP: 92/62 92/62  Pulse: 87 88  Resp:  14  Temp: (!) 35.9 C   SpO2:  99%    Last Pain:  Vitals:   12/31/21 0840  TempSrc: Temporal  PainSc: 0-No pain                 Molli Barrows

## 2021-12-31 NOTE — H&P (Signed)
Jonathon Bellows, MD [address] [place], [place], [place], [address] [address], [place], [place], [place], [address] Phone: [phone]  Fax: [phone]  Primary Care Physician:  Jon Billings, NP   Pre-Procedure History & Physical: HPI:  Darren Allen is a 56 y.o. male is here for an endoscopy and colonoscopy    Past Medical History:  Diagnosis Date   Allergy    Calculus of kidney 02/04/2015   COPD (chronic obstructive pulmonary disease) ([place])    Diabetes mellitus without complication ([place])    type 2   Hypertension    Neuropathy     Past Surgical History:  Procedure Laterality Date   APPENDECTOMY     INCISION AND DRAINAGE PERIRECTAL ABSCESS N/A 02/04/2015   Procedure: IRRIGATION AND DEBRIDEMENT PERIRECTAL ABSCESS;  Surgeon: Marlyce Huge, MD;  Location: [facility] ORS;  Service: General;  Laterality: N/A;   ORIF ANKLE FRACTURE Left 03/23/2017   Procedure: OPEN REDUCTION INTERNAL FIXATION (ORIF) ANKLE FRACTURE;  Surgeon: Leim Fabry, MD;  Location: [facility] ORS;  Service: Orthopedics;  Laterality: Left;   RECTAL EXAM UNDER ANESTHESIA  02/04/2015   Procedure: RECTAL EXAM UNDER ANESTHESIA;  Surgeon: Marlyce Huge, MD;  Location: [facility] ORS;  Service: General;;   SYNDESMOSIS REPAIR Left 03/23/2017   Procedure: SYNDESMOSIS REPAIR;  Surgeon: Leim Fabry, MD;  Location: [facility] ORS;  Service: Orthopedics;  Laterality: Left;    Prior to Admission medications   Medication Sig Start Date End Date Taking? Authorizing Provider  Accu-Chek Softclix Lancets lancets 3 (three) times daily. 09/30/20  Yes [provider]  acetaminophen (TYLENOL) 500 MG tablet Take 2 tablets (1,000 mg total) by mouth every 8 (eight) hours. 03/24/17  Yes Reche Dixon, PA-C  amitriptyline (ELAVIL) 10 MG tablet Take 30 mg by mouth at bedtime.   Yes [provider]  ascorbic acid (VITAMIN C) 500 MG tablet Take by mouth daily.   Yes [provider]  BD DISP NEEDLES 22G X  1-1/2" MISC every 14 (fourteen) days. 09/09/20  Yes [provider]  BD PLASTIPAK SYRINGE 21G X 1" 3 ML MISC USE AS DIRECTED 12/12/21  Yes McGowan, Larene Beach A, PA-C  doxycycline (VIBRAMYCIN) 100 MG capsule Take 1 capsule (100 mg total) by mouth 2 (two) times daily. 12/08/21  Yes Rodriguez-Southworth, Sunday Spillers, PA-C  ezetimibe (ZETIA) 10 MG tablet Take 10 mg by mouth daily. 11/04/21  Yes [provider]  ferrous sulfate 325 (65 FE) MG tablet Take 325 mg by mouth daily with breakfast.   Yes [provider]  fluticasone (FLONASE) 50 MCG/ACT nasal spray Place into both nostrils. 07/02/21  Yes [provider]  gabapentin (NEURONTIN) 600 MG tablet Take 600 mg by mouth 3 (three) times daily. 03/26/21  Yes [provider]  hydrochlorothiazide (HYDRODIURIL) 25 MG tablet Take 25 mg by mouth daily. 02/26/17  Yes [provider]  HYDROcodone-acetaminophen (NORCO/VICODIN) 5-325 MG tablet Take 1 tablet by mouth 2 (two) times daily as needed. Must last 30 days. 12/28/21 01/27/22 Yes Milinda Pointer, MD  insulin isophane & regular human (HUMULIN 70/30 KWIKPEN) (70-30) 100 UNIT/ML KwikPen Inject 58 Units into the skin 2 (two) times daily with a meal. 58 with meals and 54 qhs 10/14/19  Yes [provider]  lisinopril (PRINIVIL,ZESTRIL) 40 MG tablet Take 40 mg by mouth daily. 02/26/17  Yes [provider]  metFORMIN (GLUCOPHAGE-XR) 500 MG 24 hr tablet TAKE 4 TABLETS(2000 MG) BY MOUTH DAILY 12/16/21  Yes Jon Billings, NP  montelukast (SINGULAIR) 10  MG tablet Take 10 mg by mouth daily. 07/02/21  Yes [provider]  NEEDLE, DISP, 18 G (B-D BLUNT FILL NEEDLE) 18G X 1-1/2" MISC Use 18G needle to draw testosterone for injection 10/15/20  Yes Billey Co, MD  pantoprazole (PROTONIX) 40 MG tablet Take 2 tablets (80 mg total) by mouth daily. 11/06/21  Yes Jon Billings, NP  predniSONE (STERAPRED UNI-PAK 21 TAB) 10 MG (21) TBPK tablet Take by  mouth daily. Take 6 tabs by mouth daily  for 2 days, then 5 tabs for 2 days, then 4 tabs for 2 days, then 3 tabs for 2 days, 2 tabs for 2 days, then 1 tab by mouth daily for 2 days 12/08/21  Yes Rodriguez-Southworth, Sandrea Matte  PROAIR HFA 108 (90 Base) MCG/ACT inhaler Inhale 2 puffs into the lungs every 6 (six) hours as needed. 12/28/20  Yes [provider]  rosuvastatin (CRESTOR) 40 MG tablet Take 40 mg by mouth daily. 09/30/20  Yes [provider]  testosterone cypionate (DEPOTESTOSTERONE CYPIONATE) 200 MG/ML injection Inject 0.5 mLs (100 mg total) into the muscle every 14 (fourteen) days. 11/10/21  Yes McGowan, Shannon A, PA-C  TRELEGY ELLIPTA 200-62.5-25 MCG/INH AEPB Inhale 1 puff into the lungs daily. 10/10/20  Yes [provider]  triamcinolone cream (KENALOG) 0.1 % Apply 1 application  topically 2 (two) times daily. 08/25/21  Yes Jon Billings, NP  TRULICITY 3 DE/0.8XK SOPN Inject 3 mg into the skin once a week. 10/28/21  Yes [provider]  HYDROcodone-acetaminophen (NORCO/VICODIN) 5-325 MG tablet Take 1 tablet by mouth 2 (two) times daily as needed. Must last 30 days. 11/28/21 12/28/21  Milinda Pointer, MD  metoprolol tartrate (LOPRESSOR) 100 MG tablet Take 1 tablet (100 mg total) by mouth once for 1 dose. Please take one time dose '100mg'$  metoprolol tartrate 2 hr prior to cardiac CT for HR control IF HR >55bpm. 06/26/21 11/26/21  Kate Sable, MD    Allergies as of 12/26/2021 - Review Complete 12/25/2021  Allergen Reaction Noted   Glipizide Other (See Comments) and Palpitations 12/04/2016   Cephalexin Rash 06/13/2021   Duloxetine Anxiety and Nausea Only 04/23/2020    Family History  Problem Relation Age of Onset   Cancer Mother 69       Lung   Cancer Father        Colon   Heart disease Father    Alcohol abuse Father    Cancer Brother 32       Esophageal   Diabetes Brother    Heart disease Brother     Social History   Socioeconomic  History   Marital status: Single    Spouse name: Not on file   Number of children: Not on file   Years of education: Not on file   Highest education level: Not on file  Occupational History   Not on file  Tobacco Use   Smoking status: Every Day    Packs/day: 1.00    Years: 34.00    Total pack years: 34.00    Types: Cigarettes   Smokeless tobacco: Never   Tobacco comments:    patient using Nicoderm patches  Vaping Use   Vaping Use: Never used  Substance and Sexual Activity   Alcohol use: Not Currently    Alcohol/week: 12.0 standard drinks of alcohol    Types: 12 Cans of beer per week    Comment: Quit February 2023   Drug use: No   Sexual activity: Yes  Other Topics Concern  Not on file  Social History Narrative   Not on file   Social Determinants of Health   Financial Resource Strain: Not on file  Food Insecurity: Not on file  Transportation Needs: Not on file  Physical Activity: Not on file  Stress: Not on file  Social Connections: Not on file  Intimate Partner Violence: Not on file    Review of Systems: See HPI, otherwise negative ROS  Physical Exam: BP 139/84   Pulse 94   Temp (!) 96.7 F (35.9 C) (Temporal)   Resp 20   Ht '6\' 2"'$  (1.88 m)   Wt 112.5 kg   SpO2 97%   BMI 31.84 kg/m  General:   Alert,  pleasant and cooperative in NAD Head:  Normocephalic and atraumatic. Neck:  Supple; no masses or thyromegaly. Lungs:  Clear throughout to auscultation, normal respiratory effort.    Heart:  +S1, +S2, Regular rate and rhythm, No edema. Abdomen:  Soft, nontender and nondistended. Normal bowel sounds, without guarding, and without rebound.   Neurologic:  Alert and  oriented x4;  grossly normal neurologically.  Impression/Plan: Darren Allen is here for an endoscopy and colonoscopy  to be performed for  evaluation of hiatal hernia and colon cancer screening     Risks, benefits, limitations, and alternatives regarding endoscopy have been reviewed with the  patient.  Questions have been answered.  All parties agreeable.   Jonathon Bellows, MD  12/31/2021, 7:25 AM

## 2022-01-01 ENCOUNTER — Encounter: Payer: Self-pay | Admitting: Gastroenterology

## 2022-01-01 ENCOUNTER — Ambulatory Visit: Payer: HMO | Admitting: Pain Medicine

## 2022-01-01 LAB — SURGICAL PATHOLOGY

## 2022-01-02 ENCOUNTER — Ambulatory Visit: Payer: PPO | Admitting: Urology

## 2022-01-06 NOTE — Progress Notes (Unsigned)
01/07/2022 11:07 AM   Darren Allen 08-01-1965 [id number]  Referring provider: Dione Housekeeper, MD [address] [place] [employer],  [place] [address]  Urological history: 1.  Testosterone deficiency -Contributing factors of age, diabetes, former alcohol abuse and chronic pain medications -testosterone level (12/2021) 107  -Hemoglobin/hematocrit (12/2021) 12.9/39.5 -testosterone cypionate 200 mg/mL, 1 cc every 14 days ***  2. BPH with LU TS -PSA (12/2021) 1.2  -I PSS ***  3. ED -Contributing factors of age, BPH, diabetes, testosterone deficiency, chronic pain medications, blood pressure medications, COPD, hyperlipidemia, obesity, sleep apnea, former alcohol abuse and smoking -SHIM *** -Managed with tadalafil 5 mg daily ***  4.  Nephrolithiasis -Spontaneous passage of left ureteral stone in 2012  No chief complaint on file.    HPI: Darren Allen is a 56 y.o. male who presents today for follow up.       Score:  1-7 Mild 8-19 Moderate 20-35 Severe      Score: 1-7 Severe ED 8-11 Moderate ED 12-16 Mild-Moderate ED 17-21 Mild ED 22-25 No ED     PMH: Past Medical History:  Diagnosis Date   Allergy    Calculus of kidney 02/04/2015   COPD (chronic obstructive pulmonary disease) (HCC)    Diabetes mellitus without complication (HCC)    type 2   Hypertension    Neuropathy     Surgical History: Past Surgical History:  Procedure Laterality Date   APPENDECTOMY     COLONOSCOPY WITH PROPOFOL N/A 12/31/2021   Procedure: COLONOSCOPY WITH PROPOFOL;  Surgeon: Wyline Mood, MD;  Location: [facility];  Service: Gastroenterology;  Laterality: N/A;   ESOPHAGOGASTRODUODENOSCOPY N/A 12/31/2021   Procedure: ESOPHAGOGASTRODUODENOSCOPY (EGD);  Surgeon: Wyline Mood, MD;  Location: [facility];  Service: Gastroenterology;  Laterality: N/A;   INCISION AND DRAINAGE PERIRECTAL ABSCESS N/A 02/04/2015   Procedure: IRRIGATION AND DEBRIDEMENT PERIRECTAL ABSCESS;   Surgeon: Ida Rogue, MD;  Location: [facility] ORS;  Service: General;  Laterality: N/A;   ORIF ANKLE FRACTURE Left 03/23/2017   Procedure: OPEN REDUCTION INTERNAL FIXATION (ORIF) ANKLE FRACTURE;  Surgeon: Signa Kell, MD;  Location: [facility] OR[facility];  Service: Orthopedics;  Laterality: Left;   RECTAL EXAM UNDER ANESTHESIA  02/04/2015   Procedure: RECTAL EXAM UNDER ANESTHESIA;  Surgeon: Ida Rogue, MD;  Location: [facility] ORS;  Service: General;;   SYNDESMOSIS REPAIR Left 03/23/2017   Procedure: SYNDESMOSIS REPAIR;  Surgeon: Signa Kell, MD;  Location: [facility] ORS;  Service: Orthopedics;  Laterality: Left;    Home Medications:  Allergies as of 01/07/2022       Reactions   Glipizide Other (See Comments), Palpitations   Shaky, feel bad Other reaction(s): Dizziness   Cephalexin Rash   Duloxetine Anxiety, Nausea Only        Medication List        Accurate as of January 06, 2022 11:07 AM. If you have any questions, ask your nurse or doctor.          Accu-Chek Softclix Lancets lancets 3 (three) times daily.   acetaminophen 500 MG tablet Commonly known as: TYLENOL Take 2 tablets (1,000 mg total) by mouth every 8 (eight) hours.   amitriptyline 10 MG tablet Commonly known as: ELAVIL Take 30 mg by mouth at bedtime.   ascorbic acid 500 MG tablet Commonly known as: VITAMIN C Take by mouth daily.   B-D BLUNT FILL NEEDLE 18G X 1-1/2" Misc Generic drug: NEEDLE (DISP) 18 G Use 18G needle to draw testosterone for injection   BD Disp Needles 22G  X 1-1/2" Misc Generic drug: NEEDLE (DISP) 22 G every 14 (fourteen) days.   BD Plastipak Syringe 21G X 1" 3 ML Misc Generic drug: SYRINGE-NEEDLE (DISP) 3 ML USE AS DIRECTED   doxycycline 100 MG capsule Commonly known as: VIBRAMYCIN Take 1 capsule (100 mg total) by mouth 2 (two) times daily.   ezetimibe 10 MG tablet Commonly known as: ZETIA Take 10 mg by mouth daily.   ferrous sulfate 325 (65 FE) MG tablet Take 325 mg by mouth  daily with breakfast.   fluticasone 50 MCG/ACT nasal spray Commonly known as: FLONASE Place into both nostrils.   gabapentin 600 MG tablet Commonly known as: NEURONTIN Take 600 mg by mouth 3 (three) times daily.   HumuLIN 70/30 KwikPen (70-30) 100 UNIT/ML KwikPen Generic drug: insulin isophane & regular human KwikPen Inject 58 Units into the skin 2 (two) times daily with a meal. 58 with meals and 54 qhs   hydrochlorothiazide 25 MG tablet Commonly known as: HYDRODIURIL Take 25 mg by mouth daily.   HYDROcodone-acetaminophen 5-325 MG tablet Commonly known as: NORCO/VICODIN Take 1 tablet by mouth 2 (two) times daily as needed. Must last 30 days.   HYDROcodone-acetaminophen 5-325 MG tablet Commonly known as: NORCO/VICODIN Take 1 tablet by mouth 2 (two) times daily as needed. Must last 30 days.   lisinopril 40 MG tablet Commonly known as: ZESTRIL Take 40 mg by mouth daily.   metFORMIN 500 MG 24 hr tablet Commonly known as: GLUCOPHAGE-XR TAKE 4 TABLETS(2000 MG) BY MOUTH DAILY   metoprolol tartrate 100 MG tablet Commonly known as: LOPRESSOR Take 1 tablet (100 mg total) by mouth once for 1 dose. Please take one time dose $RemoveB'100mg'MCabXAxF$  metoprolol tartrate 2 hr prior to cardiac CT for HR control IF HR >55bpm.   montelukast 10 MG tablet Commonly known as: SINGULAIR Take 10 mg by mouth daily.   pantoprazole 40 MG tablet Commonly known as: PROTONIX Take 2 tablets (80 mg total) by mouth daily.   predniSONE 10 MG (21) Tbpk tablet Commonly known as: STERAPRED UNI-PAK 21 TAB Take by mouth daily. Take 6 tabs by mouth daily  for 2 days, then 5 tabs for 2 days, then 4 tabs for 2 days, then 3 tabs for 2 days, 2 tabs for 2 days, then 1 tab by mouth daily for 2 days   ProAir HFA 108 (90 Base) MCG/ACT inhaler Generic drug: albuterol Inhale 2 puffs into the lungs every 6 (six) hours as needed.   rosuvastatin 40 MG tablet Commonly known as: CRESTOR Take 40 mg by mouth daily.   testosterone  cypionate 200 MG/ML injection Commonly known as: DEPOTESTOSTERONE CYPIONATE Inject 0.5 mLs (100 mg total) into the muscle every 14 (fourteen) days.   Trelegy Ellipta 200-62.5-25 MCG/ACT Aepb Generic drug: Fluticasone-Umeclidin-Vilant Inhale 1 puff into the lungs daily.   triamcinolone cream 0.1 % Commonly known as: KENALOG Apply 1 application  topically 2 (two) times daily.   Trulicity 3 MW/4.1LK Sopn Generic drug: Dulaglutide Inject 3 mg into the skin once a week.        Allergies:  Allergies  Allergen Reactions   Glipizide Other (See Comments) and Palpitations    Shaky, feel bad Other reaction(s): Dizziness   Cephalexin Rash   Duloxetine Anxiety and Nausea Only    Family History: Family History  Problem Relation Age of Onset   Cancer Mother 69       Lung   Cancer Father        Colon   Heart  disease Father    Alcohol abuse Father    Cancer Brother 11       Esophageal   Diabetes Brother    Heart disease Brother     Social History:  reports that he has been smoking cigarettes. He has a 34.00 pack-year smoking history. He has never used smokeless tobacco. He reports that he does not currently use alcohol after a past usage of about 12.0 standard drinks of alcohol per week. He reports that he does not use drugs.  ROS: Pertinent ROS in HPI  Physical Exam: There were no vitals taken for this visit.  Constitutional:  Well nourished. Alert and oriented, No acute distress. HEENT: [place] AT, moist mucus membranes.  Trachea midline Cardiovascular: No clubbing, cyanosis, or edema. Respiratory: Normal respiratory effort, no increased work of breathing. GU: No CVA tenderness.  No bladder fullness or masses.  Patient with circumcised/uncircumcised phallus. ***Foreskin easily retracted***  Urethral meatus is patent.  No penile discharge. No penile lesions or rashes. Scrotum without lesions, cysts, rashes and/or edema.  Testicles are located scrotally bilaterally. No masses are  appreciated in the testicles. Left and right epididymis are normal. Rectal: Patient with  normal sphincter tone. Anus and perineum without scarring or rashes. No rectal masses are appreciated. Prostate is approximately *** grams, *** nodules are appreciated. Seminal vesicles are normal. Neurologic: Grossly intact, no focal deficits, moving all 4 extremities. Psychiatric: Normal mood and affect.   Laboratory Data: Lab Results  Component Value Date   WBC 6.2 10/23/2021   HGB 12.9 (L) 12/30/2021   HCT 39.5 12/30/2021   MCV 86 10/23/2021   PLT 216 10/23/2021   Glucose 70 - 110 mg/dL 246 High    Sodium 136 - 145 mmol/L 139   Potassium 3.6 - 5.1 mmol/L 4.2   Chloride 97 - 109 mmol/L 104   Carbon Dioxide (CO2) 22.0 - 32.0 mmol/L 24.8   Calcium 8.7 - 10.3 mg/dL 9.6   Urea Nitrogen (BUN) 7 - 25 mg/dL 17   Creatinine 0.7 - 1.3 mg/dL 1.2   Glomerular Filtration Rate (eGFR) >60 mL/min/1.73sq m 70   Comment: CKD-EPI (2021) does not include patient's race in the calculation of eGFR.  Monitoring changes of plasma creatinine and eGFR over time is useful for monitoring kidney function.   Interpretive Ranges for eGFR (CKD-EPI 2021):   eGFR:       >60 mL/min/1.73 sq. m - Normal  eGFR:       30-59 mL/min/1.73 sq. m - Moderately Decreased  eGFR:       15-29 mL/min/1.73 sq. m  - Severely Decreased  eGFR:       < 15 mL/min/1.73 sq. m  - Kidney Failure    Note: These eGFR calculations do not apply in acute situations when eGFR is changing rapidly or patients on dialysis.  BUN/Crea Ratio 6.0 - 20.0 14.2   Anion Gap w/K 6.0 - 16.0 14.4   Resulting Agency  [facility] - LAB   Specimen Collected: 01/05/22 10:09   Performed by: Friend: 01/05/22 12:26  Received From: [facility]  Result Received: 01/06/22 10:37   Hemoglobin A1C 4.2 - 5.6 % 7.8 High    Average Blood Glucose (Calc) mg/dL [facility] - LAB   Narrative Performed by [facility] - LAB Normal Range:    4.2 - 5.6%  Increased Risk:  5.7 - 6.4%  Diabetes:        >=  6.5%  Glycemic Control for adults with diabetes:  <7%    Specimen Collected: 01/05/22 10:09   Performed by: [place]: 01/05/22 13:31  Received From: [facility]  Result Received: 01/06/22 10:37   Cholesterol, Total 100 - 200 mg/dL 118   Triglyceride 35 - 199 mg/dL 325 High    HDL (High Density Lipoprotein) Cholesterol 29.0 - 71.0 mg/dL 27.8 Low    LDL Calculated 0 - 130 mg/dL 25   VLDL Cholesterol mg/dL 65   Cholesterol/HDL Ratio  4.2   Resulting Agency  [facility] - LAB   Specimen Collected: 01/05/22 10:09   Performed by: [place]: 01/05/22 12:28  Received From: [facility]  Result Received: 01/06/22 10:37   Lab Results  Component Value Date   TESTOSTERONE 107 (L) 12/30/2021   Lab Results  Component Value Date   AST 25 10/23/2021   Lab Results  Component Value Date   ALT 29 10/23/2021  I have reviewed the labs.   Pertinent Imaging: N/A  Assessment & Plan:    1. Testosterone deficiency  -testosterone levels subtherapeutic -Hemoglobin/hematocrit show anemia -***  2. BPH with LUTS -PSA stable -continue conservative management, avoiding bladder irritants and timed voiding's  3. Erectile dysfunction:    -Continue tadalafil 5 mg daily -Gave patient good Rx and single care coupon sent in case his insurance would no longer cover the medication as he is switched to Medicare  4.  Nephrolithiasis -No issues with renal colic or passage of fragments since last visit    No follow-ups on file.  These notes generated with voice recognition software. I apologize for typographical errors.  [place], [place] [address]  [place], [place] [address] [phone]

## 2022-01-07 ENCOUNTER — Ambulatory Visit (INDEPENDENT_AMBULATORY_CARE_PROVIDER_SITE_OTHER): Payer: HMO | Admitting: Urology

## 2022-01-07 ENCOUNTER — Encounter: Payer: Self-pay | Admitting: Urology

## 2022-01-07 VITALS — BP 120/81 | HR 94 | Ht 74.0 in | Wt 245.0 lb

## 2022-01-07 DIAGNOSIS — N401 Enlarged prostate with lower urinary tract symptoms: Secondary | ICD-10-CM | POA: Diagnosis not present

## 2022-01-07 DIAGNOSIS — E291 Testicular hypofunction: Secondary | ICD-10-CM | POA: Diagnosis not present

## 2022-01-07 DIAGNOSIS — N529 Male erectile dysfunction, unspecified: Secondary | ICD-10-CM | POA: Diagnosis not present

## 2022-01-07 DIAGNOSIS — Z87442 Personal history of urinary calculi: Secondary | ICD-10-CM | POA: Diagnosis not present

## 2022-01-07 DIAGNOSIS — E349 Endocrine disorder, unspecified: Secondary | ICD-10-CM

## 2022-01-07 DIAGNOSIS — N138 Other obstructive and reflux uropathy: Secondary | ICD-10-CM

## 2022-01-07 DIAGNOSIS — N200 Calculus of kidney: Secondary | ICD-10-CM

## 2022-01-07 MED ORDER — TADALAFIL 5 MG PO TABS: 5.0000 mg | ORAL_TABLET | Freq: Every day | ORAL | 3 refills | Status: DC | PRN

## 2022-01-08 ENCOUNTER — Encounter: Payer: Self-pay | Admitting: Gastroenterology

## 2022-01-12 ENCOUNTER — Telehealth: Payer: Self-pay | Admitting: Nurse Practitioner

## 2022-01-12 NOTE — Telephone Encounter (Signed)
He would need an appt.

## 2022-01-12 NOTE — Telephone Encounter (Unsigned)
Copied from [place] [phone]. Topic: General - Other >> Jan 12, 2022  2:42 PM Gaetano Hawthorne wrote: [place]  RN case [place] [phone]   Caller states patient would like PCP to prescribe nicotine patch. Patient is at 1/2 pack a day. Unsure if an appointment is needed or PCP can send in Rx   [place], [place] [facility]  Phone:  [phone] Fax:  [phone]

## 2022-01-12 NOTE — Progress Notes (Unsigned)
PROVIDER NOTE: Information contained herein reflects review and annotations entered in association with encounter. Interpretation of such information and data should be left to medically-trained personnel. Information provided to patient can be located elsewhere in the medical record under "Patient Instructions". Document created using STT-dictation technology, any transcriptional errors that may result from process are unintentional.    Patient: Darren Allen  Service Category: E/M  Provider: Gaspar Cola, MD  DOB: 1966-02-09  DOS: 01/13/2022  Referring Provider: Jon Billings, NP  MRN: [id number]  Specialty: Interventional Pain Management  PCP: Jon Billings, NP  Type: Established Patient  Setting: Ambulatory outpatient    Location: Office  Delivery: Face-to-face     HPI  [name], a 56 y.o. year old male, is here today because of his No primary diagnosis found.. Darren Allen primary complain today is No chief complaint on file. Last encounter: My last encounter with him was on 12/23/2021. Pertinent problems: Darren Allen has Ankle fracture; Neuropathy; Diabetic peripheral neuropathy ([place]); Gout; Chronic ankle pain (Bilateral); Chronic low back pain (1ry area of Pain) (Bilateral) (R>L) w/o sciatica; Other acquired hammer toe; Chronic pain syndrome; Lumbar facet syndrome; Lumbosacral radiculopathy at S1 (Left); Chronic feet pain (2ry area of Pain) (Bilateral); Chronic ankle pain (Left); Abnormal NCS (nerve conduction studies) (02/20/2020); Abnormal MRI, lumbar spine (03/11/2020); DDD (degenerative disc disease), lumbosacral; and Lumbosacral lateral recess stenosis (Left: L5-S1) on their pertinent problem list. Pain Assessment: Severity of   is reported as a  /10. Location:    / . Onset:  . Quality:  . Timing:  . Modifying factor(s):  Marland Kitchen Vitals:  vitals were not taken for this visit.   Reason for encounter: both, medication management and post-procedure evaluation and  assessment. ***  Post-procedure evaluation   Qutenza Neurolysis #1  Laterality:  Bilateral Area treated: Feet Imaging Guidance: None Anesthesia/analgesia/anxiolysis/sedation: None required Medication (Right): Qutenza patches Medication (Left): Qutenza patches Date: 12/23/2021 Performed by: Gaspar Cola, MD 1. Chronic feet pain (2ry area of Pain) (Bilateral)   2. Diabetic peripheral neuropathy ([place])   3. Abnormal NCS (nerve conduction studies) (02/20/2020)   4. Neuropathy   5. Lumbosacral radiculopathy at S1 (Left)   6. Abnormal MRI, lumbar spine (03/11/2020)   7. Encounter for therapeutic procedure    NAS-11 Pain score:   Pre-procedure: 5 /10   Post-procedure: 5 /10      Effectiveness:  Initial hour after procedure:   ***. Subsequent 4-6 hours post-procedure:   ***. Analgesia past initial 6 hours:   ***. Ongoing improvement:  Analgesic:  *** Function:    ***    ROM:    ***     Pharmacotherapy Assessment  Analgesic: Hydrocodone/APAP 5/325 tablet, 1 tab p.o. twice daily (#60) MME/day: 10 mg/day   Monitoring: [place] PMP: PDMP reviewed during this encounter.       Pharmacotherapy: No side-effects or adverse reactions reported. Compliance: No problems identified. Effectiveness: Clinically acceptable.  No notes on file  No results found for: "CBDTHCR" No results found for: "D8THCCBX" No results found for: "D9THCCBX"  UDS:  Summary  Date Value Ref Range Status  09/08/2021 Note  Final    Comment:    ==================================================================== Compliance Drug Analysis, Ur ==================================================================== Test                             Result       Flag       Units  Drug Present and  Declared for Prescription Verification   Hydrocodone                    697          EXPECTED   ng/mg creat   Dihydrocodeine                 36           EXPECTED   ng/mg creat   Norhydrocodone                 1265          EXPECTED   ng/mg creat    Sources of hydrocodone include scheduled prescription medications.    Dihydrocodeine and norhydrocodone are expected metabolites of    hydrocodone. Dihydrocodeine is also available as a scheduled    prescription medication.    Gabapentin                     PRESENT      EXPECTED   Amitriptyline                  PRESENT      EXPECTED   Nortriptyline                  PRESENT      EXPECTED    Nortriptyline is an expected metabolite of amitriptyline.    Acetaminophen                  PRESENT      EXPECTED  Drug Absent but Declared for Prescription Verification   Metoprolol                     Not Detected UNEXPECTED ==================================================================== Test                      Result    Flag   Units      Ref Range   Creatinine              284              mg/dL      >=20 ==================================================================== Declared Medications:  The flagging and interpretation on this report are based on the  following declared medications.  Unexpected results may arise from  inaccuracies in the declared medications.   **Note: The testing scope of this panel includes these medications:   Amitriptyline (Elavil)  Gabapentin (Neurontin)  Hydrocodone (Norco)  Metoprolol   **Note: The testing scope of this panel does not include small to  moderate amounts of these reported medications:   Acetaminophen (Tylenol)  Acetaminophen (Norco)   **Note: The testing scope of this panel does not include the  following reported medications:   Albuterol (Proair HFA)  Fluticasone (Flonase)  Fluticasone (Trelegy)  Hydrochlorothiazide  Insulin (Humulin)  Iron  Levalbuterol (Xopenex)  Lisinopril (Zestril)  Metformin  Montelukast  Pantoprazole (Protonix)  Rosuvastatin (Crestor)  Testosterone  Triamcinolone (Kenalog)  Umeclidinium (Trelegy)  Vilanterol (Trelegy)  Vitamin  C ==================================================================== For clinical consultation, please call [phone]. ====================================================================       ROS  Constitutional: Denies any fever or chills Gastrointestinal: No reported hemesis, hematochezia, vomiting, or acute GI distress Musculoskeletal: Denies any acute onset joint swelling, redness, loss of ROM, or weakness Neurological: No reported episodes of acute onset apraxia, aphasia, dysarthria, agnosia, amnesia, paralysis, loss of coordination, or loss of consciousness  Medication Review  Accu-Chek Softclix Lancets,  Dulaglutide, Fluticasone-Umeclidin-Vilant, HYDROcodone-acetaminophen, NEEDLE (DISP) 18 G, NEEDLE (DISP) 22 G, SYRINGE-NEEDLE (DISP) 3 ML, acetaminophen, albuterol, amitriptyline, ascorbic acid, ezetimibe, ferrous sulfate, fluticasone, gabapentin, hydrochlorothiazide, insulin isophane & regular human KwikPen, lisinopril, metFORMIN, metoprolol tartrate, montelukast, pantoprazole, rosuvastatin, tadalafil, testosterone cypionate, and triamcinolone cream  History Review  Allergy: Darren Allen is allergic to glipizide, cephalexin, and duloxetine. Drug: Darren Allen  reports no history of drug use. Alcohol:  reports that he does not currently use alcohol after a past usage of about 12.0 standard drinks of alcohol per week. Tobacco:  reports that he has been smoking cigarettes. He has a 34.00 pack-year smoking history. He has never used smokeless tobacco. Social: Darren Allen  reports that he has been smoking cigarettes. He has a 34.00 pack-year smoking history. He has never used smokeless tobacco. He reports that he does not currently use alcohol after a past usage of about 12.0 standard drinks of alcohol per week. He reports that he does not use drugs. Medical:  has a past medical history of Allergy, Calculus of kidney (02/04/2015), COPD (chronic obstructive pulmonary disease) ([place]),  Diabetes mellitus without complication ([place]), Hypertension, and Neuropathy. Surgical: Darren Allen  has a past surgical history that includes Appendectomy; Incision and drainage perirectal abscess (N/A, 02/04/2015); Rectal exam under anesthesia (02/04/2015); ORIF ankle fracture (Left, 03/23/2017); Syndesmosis repair (Left, 03/23/2017); Colonoscopy with propofol (N/A, 12/31/2021); and Esophagogastroduodenoscopy (N/A, 12/31/2021). Family: family history includes Alcohol abuse in his father; Cancer in his father; Cancer (age of onset: 76) in his brother; Cancer (age of onset: 31) in his mother; Diabetes in his brother; Heart disease in his brother and father.  Laboratory Chemistry Profile   Renal Lab Results  Component Value Date   BUN 13 10/23/2021   CREATININE 1.22 10/23/2021   BCR 11 10/23/2021   GFRAA 59 (L) 12/07/2019   GFRNONAA >60 05/30/2021    Hepatic Lab Results  Component Value Date   AST 25 10/23/2021   ALT 29 10/23/2021   ALBUMIN 4.4 10/23/2021   ALKPHOS 70 10/23/2021    Electrolytes Lab Results  Component Value Date   NA 138 10/23/2021   K 4.3 10/23/2021   CL 100 10/23/2021   CALCIUM 9.9 10/23/2021   MG 1.7 09/08/2021   PHOS 3.2 04/01/2010    Bone Lab Results  Component Value Date   25OHVITD1 44 09/08/2021   25OHVITD2 <1.0 09/08/2021   25OHVITD3 43 09/08/2021   TESTOSTERONE 107 (L) 12/30/2021    Inflammation (CRP: Acute Phase) (ESR: Chronic Phase) Lab Results  Component Value Date   CRP 1.8 (H) 09/08/2021   ESRSEDRATE 17 09/08/2021   LATICACIDVEN 1.7 05/28/2021         Note: Above Lab results reviewed.  Recent Imaging Review  DG PAIN CLINIC C-ARM 1-60 MIN NO REPORT Fluoro was used, but no Radiologist interpretation will be provided.  Please refer to "NOTES" tab for provider progress note. Note: Reviewed        Physical Exam  General appearance: Well nourished, well developed, and well hydrated. In no apparent acute distress Mental status: Alert, oriented  x 3 (person, place, & time)       Respiratory: No evidence of acute respiratory distress Eyes: PERLA Vitals: There were no vitals taken for this visit. BMI: Estimated body mass index is 31.46 kg/m as calculated from the following:   Height as of 01/07/22: _0  (1.88 m).   Weight as of 01/07/22: 245 lb (111.1 kg). Ideal: Ideal body weight: 82.2 kg (181 lb 3.5 oz)  Adjusted ideal body weight: 93.8 kg (206 lb 11.7 oz)  Assessment   Diagnosis Status  No diagnosis found. Controlled Controlled Controlled   Updated Problems: No problems updated.   Plan of Care  Problem-specific:  No problem-specific Assessment & Plan notes found for this encounter.  Darren Allen has a current medication list which includes the following long-term medication(s): ezetimibe, ferrous sulfate, fluticasone, hydrochlorothiazide, hydrocodone-acetaminophen, hydrocodone-acetaminophen, humulin 70/30 kwikpen, lisinopril, metformin, metoprolol tartrate, montelukast, pantoprazole, rosuvastatin, tadalafil, and testosterone cypionate.  Pharmacotherapy (Medications Ordered): No orders of the defined types were placed in this encounter.  Orders:  No orders of the defined types were placed in this encounter.  Follow-up plan:   No follow-ups on file.     Interventional Therapies  Risk  Complexity Considerations:   Estimated body mass index is 33.91 kg/m as calculated from the following:   Height as of this encounter: 6' (1.829 m).   Weight as of this encounter: 250 lb (113.4 kg). WNL   Planned  Pending:      Under consideration:   Diagnostic bilateral lumbar facet MBB #1  Diagnostic/therapeutic left L5 & S1 TFESI #1  Possible spinal cord stimulator trial  Therapeutic left L5-S1 percutaneous discectomy with "[employer] Dekompressor" system    Completed:   Diagnostic left L5-S1 LESI x1 (12/16/2021) (100/100/100/LBP:85  LEP:100)  Therapeutic bilateral Qutenza neurolytic treatment x1 (12/23/2021)   (09/08/2021 & 10/29/2021) referral to physical therapy for evaluation and treatment of low back pain.   Completed by other providers:   EMG/PNCV of lower extremity (02/20/2020) by Dr. Jennings Books (generalized sensorimotor peripheral neuropathy; superimposed left S1 radiculopathy)   Therapeutic  Palliative (PRN) options:   None established     Recent Visits Date Type Provider Dept  12/23/21 Procedure visit Milinda Pointer, MD [facility]  12/16/21 Procedure visit Milinda Pointer, MD [facility]  11/26/21 Office Visit Milinda Pointer, MD [facility]  10/29/21 Office Visit Milinda Pointer, MD [facility]  Showing recent visits within past 90 days and meeting all other requirements Future Appointments Date Type Provider Dept  01/13/22 Appointment Milinda Pointer, MD [facility]  Showing future appointments within next 90 days and meeting all other requirements  I discussed the assessment and treatment plan with the patient. The patient was provided an opportunity to ask questions and all were answered. The patient agreed with the plan and demonstrated an understanding of the instructions.  Patient advised to call back or seek an in-person evaluation if the symptoms or condition worsens.  Duration of encounter: *** minutes.  Total time on encounter, as per AMA guidelines included both the face-to-face and non-face-to-face time personally spent by the physician and/or other qualified health care professional(s) on the day of the encounter (includes time in activities that require the physician or other qualified health care professional and does not include time in activities normally performed by clinical staff). Physician's time may include the following activities when performed: preparing to see the patient (eg, review of tests, pre-charting review of records) obtaining and/or reviewing separately obtained history performing  a medically appropriate examination and/or evaluation counseling and educating the patient/family/caregiver ordering medications, tests, or procedures referring and communicating with other health care professionals (when not separately reported) documenting clinical information in the electronic or other health record independently interpreting results (not separately reported) and communicating results to the patient/ family/caregiver care coordination (not separately reported)  Note by: Gaspar Cola, MD Date: 01/13/2022; Time: 3:51 PM

## 2022-01-13 ENCOUNTER — Ambulatory Visit: Payer: HMO | Attending: Pain Medicine | Admitting: Pain Medicine

## 2022-01-13 ENCOUNTER — Encounter: Payer: Self-pay | Admitting: Pain Medicine

## 2022-01-13 VITALS — BP 142/87 | HR 107 | Temp 97.9°F | Ht 74.0 in | Wt 245.0 lb

## 2022-01-13 DIAGNOSIS — Z79899 Other long term (current) drug therapy: Secondary | ICD-10-CM | POA: Insufficient documentation

## 2022-01-13 DIAGNOSIS — E1142 Type 2 diabetes mellitus with diabetic polyneuropathy: Secondary | ICD-10-CM | POA: Insufficient documentation

## 2022-01-13 DIAGNOSIS — M545 Low back pain, unspecified: Secondary | ICD-10-CM | POA: Insufficient documentation

## 2022-01-13 DIAGNOSIS — M79672 Pain in left foot: Secondary | ICD-10-CM | POA: Insufficient documentation

## 2022-01-13 DIAGNOSIS — Z79891 Long term (current) use of opiate analgesic: Secondary | ICD-10-CM | POA: Insufficient documentation

## 2022-01-13 DIAGNOSIS — M5417 Radiculopathy, lumbosacral region: Secondary | ICD-10-CM | POA: Insufficient documentation

## 2022-01-13 DIAGNOSIS — R94130 Abnormal response to nerve stimulation, unspecified: Secondary | ICD-10-CM | POA: Diagnosis present

## 2022-01-13 DIAGNOSIS — M47816 Spondylosis without myelopathy or radiculopathy, lumbar region: Secondary | ICD-10-CM | POA: Insufficient documentation

## 2022-01-13 DIAGNOSIS — M5137 Other intervertebral disc degeneration, lumbosacral region: Secondary | ICD-10-CM | POA: Diagnosis present

## 2022-01-13 DIAGNOSIS — G894 Chronic pain syndrome: Secondary | ICD-10-CM | POA: Insufficient documentation

## 2022-01-13 DIAGNOSIS — R937 Abnormal findings on diagnostic imaging of other parts of musculoskeletal system: Secondary | ICD-10-CM | POA: Diagnosis present

## 2022-01-13 DIAGNOSIS — G8929 Other chronic pain: Secondary | ICD-10-CM | POA: Diagnosis present

## 2022-01-13 DIAGNOSIS — M79671 Pain in right foot: Secondary | ICD-10-CM | POA: Diagnosis present

## 2022-01-13 MED ORDER — HYDROCODONE-ACETAMINOPHEN 5-325 MG PO TABS: 1.0000 | ORAL_TABLET | Freq: Two times a day (BID) | ORAL | 0 refills | Status: DC | PRN

## 2022-01-13 MED ORDER — NALOXONE HCL 4 MG/0.1ML NA LIQD: 1.0000 | NASAL | 0 refills | Status: AC | PRN

## 2022-01-13 NOTE — Progress Notes (Signed)
Nursing Pain Medication Assessment:  Safety precautions to be maintained throughout the outpatient stay will include: orient to surroundings, keep bed in low position, maintain call bell within reach at all times, provide assistance with transfer out of bed and ambulation.  Medication Inspection Compliance: Pill count conducted under aseptic conditions, in front of the patient. Neither the pills nor the bottle was removed from the patient's sight at any time. Once count was completed pills were immediately returned to the patient in their original bottle.  Medication: Hydrocodone/APAP Pill/Patch Count:  37 of 60 pills remain Pill/Patch Appearance: Markings consistent with prescribed medication Bottle Appearance: Standard pharmacy container. Clearly labeled. Filled Date: 10 / 14 / 2023 Last Medication intake:  TodaySafety precautions to be maintained throughout the outpatient stay will include: orient to surroundings, keep bed in low position, maintain call bell within reach at all times, provide assistance with transfer out of bed and ambulation.

## 2022-01-13 NOTE — Patient Instructions (Addendum)
____________________________________________________________________________________________  Patient Information update  To: All of our patients.  Re: Name change.  It has been made official that our current name, "[facility]"   will soon be changed to "[facility]".   The purpose of this change is to eliminate any confusion created by the concept of our practice being a "Medication Management Pain Clinic". In the past this has led to the misconception that we treat pain primarily by the use of prescription medications.  Nothing can be farther from the truth.   Understanding PAIN MANAGEMENT: To further understand what our practice does, you first have to understand that "Pain Management" is a subspecialty that requires additional training once a physician has completed their specialty training, which can be in either Anesthesia, Neurology, Psychiatry, or Physical Medicine and Rehabilitation (PMR). Each one of these contributes to the final approach taken by each physician to the management of their patient's pain. To be a "Pain Management Specialist" you must have first completed one of the specialty trainings below.  Anesthesiologists - trained in clinical pharmacology and interventional techniques such as nerve blockade and regional as well as central neuroanatomy. They are trained to block pain before, during, and after surgical interventions.  Neurologists - trained in the diagnosis and pharmacological treatment of complex neurological conditions, such as Multiple Sclerosis, Parkinson's, spinal cord injuries, and other systemic conditions that may be associated with symptoms that may include but are not limited to pain. They tend to rely primarily on the treatment of chronic pain using prescription medications.  Psychiatrist - trained in conditions affecting the psychosocial wellbeing  of patients including but not limited to depression, anxiety, schizophrenia, personality disorders, addiction, and other substance use disorders that may be associated with chronic pain. They tend to rely primarily on the treatment of chronic pain using prescription medications.   Physical Medicine and Rehabilitation (PMR) physicians, also known as physiatrists - trained to treat a wide variety of medical conditions affecting the brain, spinal cord, nerves, bones, joints, ligaments, muscles, and tendons. Their training is primarily aimed at treating patients that have suffered injuries that have caused severe physical impairment. Their training is primarily aimed at the physical therapy and rehabilitation of those patients. They may also work alongside orthopedic surgeons or neurosurgeons using their expertise in assisting surgical patients to recover after their surgeries.  INTERVENTIONAL PAIN MANAGEMENT is sub-subspecialty of Pain Management.  Our physicians are Board-certified in Anesthesia, Pain Management, and Interventional Pain Management.  This meaning that not only have they been trained and Board-certified in their specialty of Anesthesia, and subspecialty of Pain Management, but they have also received further training in the sub-subspecialty of Interventional Pain Management, in order to become Board-certified as INTERVENTIONAL PAIN MANAGEMENT SPECIALIST.    Mission: Our goal is to use our skills in  INTERVENTIONAL PAIN MANAGEMENT as alternatives to the chronic use of prescription opioid medications for the treatment of pain. To make this more clear, we have changed our name to reflect what we do and offer. We will continue to offer medication management assessment and recommendations, but we will not be taking over any patient's medication management.  ____________________________________________________________________________________________   _______________________________________________________________________  Medication Rules  Purpose: To inform patients, and their family members, of our medication rules and regulations.  Applies to: All patients receiving prescriptions from our practice (written or electronic).  Pharmacy of record: This is the pharmacy where your electronic prescriptions will be sent.   Make sure we have the correct one.  Electronic prescriptions: In compliance with the [place] Strengthen Opioid Misuse Prevention (STOP) Act of 2017 (Session Law 2017-74/H243), effective March 16, 2018, all controlled substances must be electronically prescribed. Written prescriptions, faxing, or calling prescriptions to a pharmacy will no longer be done.  Prescription refills: These will be provided only during in-person appointments. No medications will be renewed without a "face-to-face" evaluation with your provider. Applies to all prescriptions.  NOTE: The following applies primarily to controlled substances (Opioid* Pain Medications).   Type of encounter (visit): For patients receiving controlled substances, face-to-face visits are required. (Not an option and not up to the patient.)  Patient's responsibilities: Pain Pills: Bring all pain pills to every appointment (except for procedure appointments). Pill Bottles: Bring pills in original pharmacy bottle. Bring bottle, even if empty. Always bring the bottle of the most recent fill.  Medication refills: You are responsible for knowing and keeping track of what medications you are taking and when is it that you will need a refill. The day before your appointment: write a list of all prescriptions that need to be refilled. The day of the appointment: give the list to the admitting nurse. Prescriptions will be written only during appointments. No prescriptions will be written on procedure days. If you forget a medication: it will not be "Called in", "Faxed", or  "electronically sent". You will need to get another appointment to get these prescribed. No early refills. Do not call asking to have your prescription filled early. Partial  or short prescriptions: Occasionally your pharmacy may not have enough pills to fill your prescription.  NEVER ACCEPT a partial fill or a prescription that is short of the total amount of pills that you were prescribed.  With controlled substances the law allows 72 hours for the pharmacy to complete the prescription.  If the prescription is not completed within 72 hours, the pharmacist will require a new prescription to be written. This means that you will be short on your medicine and we WILL NOT send another prescription to complete your original prescription.  Instead, request the pharmacy to send a carrier to a nearby branch to get enough medication to provide you with your full prescription. Prescription Accuracy: You are responsible for carefully inspecting your prescriptions before leaving our office. Have the discharge nurse carefully go over each prescription with you, before taking them home. Make sure that your name is accurately spelled, that your address is correct. Check the name and dose of your medication to make sure it is accurate. Check the number of pills, and the written instructions to make sure they are clear and accurate. Make sure that you are given enough medication to last until your next medication refill appointment. Taking Medication: Take medication as prescribed. When it comes to controlled substances, taking less pills or less frequently than prescribed is permitted and encouraged. Never take more pills than instructed. Never take the medication more frequently than prescribed.  Inform other Doctors: Always inform, all of your healthcare providers, of all the medications you take. Pain Medication from other Providers: You are not allowed to accept any additional pain medication from any other Doctor or  Healthcare provider. There are two exceptions to this rule. (see below) In the event that you require additional pain medication, you are responsible for notifying us, as stated below. Cough Medicine: Often these contain an opioid, such as codeine or hydrocodone. Never accept or take cough medicine containing these opioids if   you are already taking an opioid* medication. The combination may cause respiratory failure and death. Medication Agreement: You are responsible for carefully reading and following our Medication Agreement. This must be signed before receiving any prescriptions from our practice. Safely store a copy of your signed Agreement. Violations to the Agreement will result in no further prescriptions. (Additional copies of our Medication Agreement are available upon request.) Laws, Rules, & Regulations: All patients are expected to follow all Federal and State Laws, Statutes, Rules, & Regulations. Ignorance of the Laws does not constitute a valid excuse.  Illegal drugs and Controlled Substances: The use of illegal substances (including, but not limited to marijuana and its derivatives) and/or the illegal use of any controlled substances is strictly prohibited. Violation of this rule may result in the immediate and permanent discontinuation of any and all prescriptions being written by our practice. The use of any illegal substances is prohibited. Adopted CDC guidelines & recommendations: Target dosing levels will be at or below 60 MME/day. Use of benzodiazepines** is not recommended.  Exceptions: There are only two exceptions to the rule of not receiving pain medications from other Healthcare Providers. Exception #1 (Emergencies): In the event of an emergency (i.e.: accident requiring emergency care), you are allowed to receive additional pain medication. However, you are responsible for: As soon as you are able, call our office [phone], at any time of the day or night, and leave a  message stating your name, the date and nature of the emergency, and the name and dose of the medication prescribed. In the event that your call is answered by a member of our staff, make sure to document and save the date, time, and the name of the person that took your information.  Exception #2 (Planned Surgery): In the event that you are scheduled by another doctor or dentist to have any type of surgery or procedure, you are allowed (for a period no longer than 30 days), to receive additional pain medication, for the acute post-op pain. However, in this case, you are responsible for picking up a copy of our "Post-op Pain Management for Surgeons" handout, and giving it to your surgeon or dentist. This document is available at our office, and does not require an appointment to obtain it. Simply go to our office during business hours (Monday-Thursday from 8:00 AM to 4:00 PM) (Friday 8:00 AM to 12:00 Noon) or if you have a scheduled appointment with us, prior to your surgery, and ask for it by name. In addition, you are responsible for: calling our office [phone], at any time of the day or night, and leaving a message stating your name, name of your surgeon, type of surgery, and date of procedure or surgery. Failure to comply with your responsibilities may result in termination of therapy involving the controlled substances. Medication Agreement Violation. Following the above rules, including your responsibilities will help you in avoiding a Medication Agreement Violation ("Breaking your Pain Medication Contract").  Consequences:  Not following the above rules may result in permanent discontinuation of medication prescription therapy.  *Opioid medications include: morphine, codeine, oxycodone, oxymorphone, hydrocodone, hydromorphone, meperidine, tramadol, tapentadol, buprenorphine, fentanyl, methadone. **Benzodiazepine medications include: diazepam (Valium), alprazolam (Xanax), clonazepam (Klonopine),  lorazepam (Ativan), clorazepate (Tranxene), chlordiazepoxide (Librium), estazolam (Prosom), oxazepam (Serax), temazepam (Restoril), triazolam (Halcion) (Last updated: 01/06/2022) ______________________________________________________________________   ______________________________________________________________________  Medication Recommendations and Reminders  Applies to: All patients receiving prescriptions (written and/or electronic).  Medication Rules & Regulations: You are responsible for reading, knowing, and   following our "Medication Rules" document. These exist for your safety and that of others. They are not flexible and neither are we. Dismissing or ignoring them is an act of "non-compliance" that may result in complete and irreversible termination of such medication therapy. For safety reasons, "non-compliance" will not be tolerated. As with the [place] fundamental legal principle of "ignorance of the law is no defense", we will accept no excuses for not having read and knowing the content of documents provided to you by our practice.  Pharmacy of record:  Definition: This is the pharmacy where your electronic prescriptions will be sent.  We do not endorse any particular pharmacy. It is up to you and your insurance to decide what pharmacy to use.  We do not restrict you in your choice of pharmacy. However, once we write for your prescriptions, we will NOT be re-sending more prescriptions to fix restricted supply problems created by your pharmacy, or your insurance.  The pharmacy listed in the electronic medical record should be the one where you want electronic prescriptions to be sent. If you choose to change pharmacy, simply notify our nursing staff. Changes will be made only during your regular appointments and not over the phone.  Recommendations: Keep all of your pain medications in a safe place, under lock and key, even if you live alone. We will NOT replace lost, stolen, or  damaged medication. We do not accept "Police Reports" as proof of medications having been stolen. After you fill your prescription, take 1 week's worth of pills and put them away in a safe place. You should keep a separate, properly labeled bottle for this purpose. The remainder should be kept in the original bottle. Use this as your primary supply, until it runs out. Once it's gone, then you know that you have 1 week's worth of medicine, and it is time to come in for a prescription refill. If you do this correctly, it is unlikely that you will ever run out of medicine. To make sure that the above recommendation works, it is very important that you make sure your medication refill appointments are scheduled at least 1 week before you run out of medicine. To do this in an effective manner, make sure that you do not leave the office without scheduling your next medication management appointment. Always ask the nursing staff to show you in your prescription , when your medication will be running out. Then arrange for the receptionist to get you a return appointment, at least 7 days before you run out of medicine. Do not wait until you have 1 or 2 pills left, to come in. This is very poor planning and does not take into consideration that we may need to cancel appointments due to bad weather, sickness, or emergencies affecting our staff. DO NOT ACCEPT A "Partial Fill": If for any reason your pharmacy does not have enough pills/tablets to completely fill or refill your prescription, do not allow for a "partial fill". The law allows the pharmacy to complete that prescription within 72 hours, without requiring a new prescription. If they do not fill the rest of your prescription within those 72 hours, you will need a separate prescription to fill the remaining amount, which we will NOT provide. If the reason for the partial fill is your insurance, you will need to talk to the pharmacist about payment alternatives for  the remaining tablets, but again, DO NOT ACCEPT A PARTIAL FILL, unless you can trust your pharmacist to obtain   the remainder of the pills within 72 hours.  Prescription refills and/or changes in medication(s):  Prescription refills, and/or changes in dose or medication, will be conducted only during scheduled medication management appointments. (Applies to both, written and electronic prescriptions.) No refills on procedure days. No medication will be changed or started on procedure days. No changes, adjustments, and/or refills will be conducted on a procedure day. Doing so will interfere with the diagnostic portion of the procedure. No phone refills. No medications will be "called into the pharmacy". No Fax refills. No weekend refills. No Holliday refills. No after hours refills.  Remember:  Business hours are:  Monday to Thursday 8:00 AM to 4:00 PM Provider's Schedule: Gwyndolyn Guilford, MD - Appointments are:  Medication management: Monday and Wednesday 8:00 AM to 4:00 PM Procedure day: Tuesday and Thursday 7:30 AM to 4:00 PM Bilal Lateef, MD - Appointments are:  Medication management: Tuesday and Thursday 8:00 AM to 4:00 PM Procedure day: Monday and Wednesday 7:30 AM to 4:00 PM (Last update: 01/06/2022) ______________________________________________________________________  ____________________________________________________________________________________________  Pharmacy Shortages of Pain Medication   Introduction Shockingly as it may seem, .  "No U.S[place] Supreme Court decision has ever interpreted the Constitution as guaranteeing a right to health care for all Americans." - https://www.healthequityandpolicylab.com/elusive-right-to-health-care-under-us-law  "With respect to human rights, the [place] has no formally codified right to health, nor does it participate in a human rights treaty that specifies a right to health." - Scott J. Schweikart, JD, MBE  Situation By  now, most of our patients have had the experience of being told by their pharmacist that they do not have enough medication to cover their prescription. If you have not had this experience, just know that you soon will.  Problem There appears to be a shortage of these medications, either at the national level or locally. This is happening with all pharmacies. When there is not enough medication, patients are offered a partial fill and they are told that they will try to get the rest of the medicine for them at a later time. If they do not have enough for even a partial fill, the pharmacists are telling the patients to call us (the prescribing physicians) to request that we send another prescription to another pharmacy to get the medicine.   This reordering of a controlled substance creates documentation problems where additional paperwork needs to be created to explain why two prescriptions for the same period of time and the same medicine are being prescribed to the same patient. It also creates situations where the last appointment note does not accurately reflect when and what prescriptions were given to a patient. This leads to prescribing errors down the line, in subsequent follow-up visits.   [place] Board of Pharmacy ([place]BOP) Research revealed that Board of Pharmacy Rule .1806 (21 NCAC 46.1806) authorizes pharmacists to the transfer of prescriptions among pharmacies, and it sets forth procedural and recordkeeping requirements for doing so. However, this requires the pharmacist to complete the previously mentioned procedural paperwork to accomplish the transfer. As it turns out, it is much easier for them to have the prescribing physicians do the work.   Possible solutions 1. You can ask your physician to assist you in weaning yourself off these medications. 2. Ask your pharmacy if the medication is in stock, 3 days prior to your refill. 3. If you need a pharmacy change, let us know at your  medication management visit. Prescriptions that have already been electronically sent to a pharmacy will   not be re-sent to a different pharmacy if your pharmacy of record does not have it in stock. Proper stocking of medication is a pharmacy problem, not a prescriber problem. Work with your pharmacist to solve the problem. 4. Have the [place] State Assembly add a provision to the "STOP ACT" (the law that mandates how controlled substances are prescribed) where there is an exception to the electronic prescribing rule that states that in the event there are shortages of medications the physicians are allowed to use written prescriptions as opposed to electronic ones. This would allow patients to take their prescriptions to a different pharmacy that may have enough medication available to fill the prescription. The problem is that currently there is a law that does not allow for written prescriptions, with the exception of instances where the electronic medical record is down due to technical issues.  5. Have [place] Congress ease the pressure on pharmaceutical companies, allowing them to produce enough quantities of the medication to adequately supply the population. 6. Have pharmacies keep enough stocks of these medications to cover their client base.  7. Have the [place] State Assembly add a provision to the "STOP ACT" where they ease the regulations surrounding the transfer of controlled substances between pharmacies, so as to simplify the transfer of supplies. As an alternative, develop a system to allow patients to obtain the remainder of their prescription at another one of their pharmacies or at an associate pharmacy.   How this shortage will affect you.  Understand that this is a pharmacy supply problem, not a prescriber problem. Work with your pharmacy to solve it. The job of the prescriber is to evaluate and monitor the patient for the appropriate indications and use of these medicines. It is  not the job of the prescriber to supply the medication or to solve problems with that supply. The responsibility and the choice to obtain the medication resides on the patient. By law, supplying the medication is the job of the pharmacy. It is certainly not the job of the prescriber to solve supply problems.   Due to the above problems we are no longer taking patients to write for their pain medication. Future discussions with your physician may include potentially weaning medications or transitioning to alternatives.  We will be focusing primarily on interventional based pain management. We will continue to evaluate for appropriate indications and we may provide recommendations regarding medication, dose, and schedule, as well as monitoring recommendations, however, we will not be taking over the actual prescribing of these substances. On those patients where we are treating their chronic pain with interventional therapies, exceptions will be considered on a case by case basis. At this time, we will try to continue providing this supplemental service to those patients we have been managing in the past. However, as of August 1st, 2023, we no longer will be sending additional prescriptions to other pharmacies for the purpose of solving their supply problems. Once we send a prescription to a pharmacy, we will not be resending it again to another pharmacy to cover for their shortages.   What to do. Write as many letters as you can. Recruit the help of family members in writing these letters. Below are some of the places where you can write to make your voice heard. Let them know what the problem is and push them to look for solutions.   Search internet for: "[place] find your legislators" https://www.ncleg.gov/findyourlegislators  Search internet for: "[place] insurance commissioner   complaints" https://www.ncdoi.gov/contactscomplaints/assistance-or-file-complaint  Search internet for: "[place] Board of Pharmacy complaints" http://www.ncbop.org/contact.htm  Search internet for: "[facility] pharmacy complaints" Email [facility] Customer Relations https://www.[facility].com/help/email-[email or web address].jsp?callType=store  Search internet for: "[facility] pharmacy customer service complaints" https://www.[facility].com/topic/marketing/contactus/contactus_customerservice.jsp  ____________________________________________________________________________________________  ____________________________________________________________________________________________  Drug Holidays (Slow)  What is a "Drug Holiday"? Drug Holiday: is the name given to the period of time during which a patient stops taking a medication(s) for the purpose of eliminating tolerance to the drug.  Benefits Improved effectiveness of opioids. Decreased opioid dose needed to achieve benefits. Improved pain with lesser dose.  What is tolerance? Tolerance: is the progressive decreased in effectiveness of a drug due to its repetitive use. With repetitive use, the body gets use to the medication and as a consequence, it loses its effectiveness. This is a common problem seen with opioid pain medications. As a result, a larger dose of the drug is needed to achieve the same effect that used to be obtained with a smaller dose.  How long should a "Drug Holiday" last? You should stay off of the pain medicine for at least 14 consecutive days. (2 weeks)  Should I stop the medicine "cold turkey"? No. You should always coordinate with your Pain Specialist so that he/she can provide you with the correct medication dose to make the transition as smoothly as possible.  How do I stop the medicine? Slowly. You will be instructed to decrease the daily amount of pills that you take by one (1) pill every seven (7) days. This is called a "slow downward taper" of your dose. For example: if you normally take four (4) pills per day, you will  be asked to drop this dose to three (3) pills per day for seven (7) days, then to two (2) pills per day for seven (7) days, then to one (1) per day for seven (7) days, and at the end of those last seven (7) days, this is when the "Drug Holiday" would start.   Will I have withdrawals? By doing a "slow downward taper" like this one, it is unlikely that you will experience any significant withdrawal symptoms. Typically, what triggers withdrawals is the sudden stop of a high dose opioid therapy. Withdrawals can usually be avoided by slowly decreasing the dose over a prolonged period of time. If you do not follow these instructions and decide to stop your medication abruptly, withdrawals may be possible.  What are withdrawals? Withdrawals: refers to the wide range of symptoms that occur after stopping or dramatically reducing opiate drugs after heavy and prolonged use. Withdrawal symptoms do not occur to patients that use low dose opioids, or those who take the medication sporadically. Contrary to benzodiazepine (example: Valium, Xanax, etc.) or alcohol withdrawals ("Delirium Tremens"), opioid withdrawals are not lethal. Withdrawals are the physical manifestation of the body getting rid of the excess receptors.  Expected Symptoms Early symptoms of withdrawal may include: Agitation Anxiety Muscle aches Increased tearing Insomnia Runny nose Sweating Yawning  Late symptoms of withdrawal may include: Abdominal cramping Diarrhea Dilated pupils Goose bumps Nausea Vomiting  Will I experience withdrawals? Due to the slow nature of the taper, it is very unlikely that you will experience any.  What is a slow taper? Taper: refers to the gradual decrease in dose.  (Last update: 10/04/2019) ____________________________________________________________________________________________   ____________________________________________________________________________________________  CBD (cannabidiol) &  Delta-8 (Delta-8 tetrahydrocannabinol) WARNING  Intro: Cannabidiol (CBD) and tetrahydrocannabinol (THC), are two natural compounds found in plants of the Cannabis genus. They can both be extracted   from hemp or cannabis. Hemp and cannabis come from the Cannabis sativa plant. Both compounds interact with your body's endocannabinoid system, but they have very different effects. CBD does not produce the high sensation associated with cannabis. Delta-8 tetrahydrocannabinol, also known as delta-8 THC, is a psychoactive substance found in the Cannabis sativa plant, of which marijuana and hemp are two varieties. THC is responsible for the high associated with the illicit use of marijuana.  Applicable to: All individuals currently taking or considering taking CBD (cannabidiol) and, more important, all patients taking opioid analgesic controlled substances (pain medication). (Example: oxycodone; oxymorphone; hydrocodone; hydromorphone; morphine; methadone; tramadol; tapentadol; fentanyl; buprenorphine; butorphanol; dextromethorphan; meperidine; codeine; etc.)  Legal status: CBD remains a Schedule I drug prohibited for any use. CBD is illegal with one exception. In the [place], CBD has a limited Food and Drug Administration (FDA) approval for the treatment of two specific types of epilepsy disorders. Only one CBD product has been approved by the FDA for this purpose: "Epidiolex". FDA is aware that some companies are marketing products containing cannabis and cannabis-derived compounds in ways that violate the Federal Food, Drug and Cosmetic Act (FD&C Act) and that may put the health and safety of consumers at risk. The FDA, a Federal agency, has not enforced the CBD status since 2018. UPDATE: (05/02/2021) The Drug Enforcement Agency (DEA) issued a letter stating that "delta" cannabinoids, including Delta-8-THCO and Delta-9-THCO, synthetically derived from hemp do not qualify as hemp and will be viewed as Schedule I  drugs. (Schedule I drugs, substances, or chemicals are defined as drugs with no currently accepted medical use and a high potential for abuse. Some examples of Schedule I drugs are: heroin, lysergic acid diethylamide (LSD), marijuana (cannabis), 3,4-methylenedioxymethamphetamine (ecstasy), methaqualone, and peyote.) (https://www.dea.gov)  Legality: Some manufacturers ship CBD products nationally, which is illegal. Often such products are sold online and are therefore available throughout the country. CBD is openly sold in head shops and health food stores in some states where such sales have not been explicitly legalized. Selling unapproved products with unsubstantiated therapeutic claims is not only a violation of the law, but also can put patients at risk, as these products have not been proven to be safe or effective. Federal illegality makes it difficult to conduct research on CBD.  Reference: "FDA Regulation of Cannabis and Cannabis-Derived Products, Including Cannabidiol (CBD)" - https://www.fda.gov/news-events/public-health-focus/fda-regulation-cannabis-and-cannabis-derived-products-including-cannabidiol-cbd  Warning: CBD is not FDA approved and has not undergo the same manufacturing controls as prescription drugs.  This means that the purity and safety of available CBD may be questionable. Most of the time, despite manufacturer's claims, it is contaminated with THC (delta-9-tetrahydrocannabinol - the chemical in marijuana responsible for the "HIGH").  When this is the case, the THC contaminant will trigger a positive urine drug screen (UDS) test for Marijuana (carboxy-THC). Because a positive UDS for any illicit substance is a violation of our medication agreement, your opioid analgesics (pain medicine) may be permanently discontinued. The FDA recently put out a warning about 5 things that everyone should be aware of regarding Delta-8 THC: Delta-8 THC products have not been evaluated or approved by  the FDA for safe use and may be marketed in ways that put the public health at risk. The FDA has received adverse event reports involving delta-8 THC-containing products. Delta-8 THC has psychoactive and intoxicating effects. Delta-8 THC manufacturing often involve use of potentially harmful chemicals to create the concentrations of delta-8 THC claimed in the marketplace. The final delta-8 THC product may have potentially   harmful by-products (contaminants) due to the chemicals used in the process. Manufacturing of delta-8 THC products may occur in uncontrolled or unsanitary settings, which may lead to the presence of unsafe contaminants or other potentially harmful substances. Delta-8 THC products should be kept out of the reach of children and pets.  MORE ABOUT CBD  General Information: CBD was discovered in 1940 and it is a derivative of the cannabis sativa genus plants (Marijuana and Hemp). It is one of the 113 identified substances found in Marijuana. It accounts for up to 40% of the plant's extract. As of 2018, preliminary clinical studies on CBD included research for the treatment of anxiety, movement disorders, and pain. CBD is available and consumed in multiple forms, including inhalation of smoke or vapor, as an aerosol spray, and by mouth. It may be supplied as an oil containing CBD, capsules, dried cannabis, or as a liquid solution. CBD is thought not to be as psychoactive as THC (delta-9-tetrahydrocannabinol - the chemical in marijuana responsible for the "HIGH"). Studies suggest that CBD may interact with different biological target receptors in the body, including cannabinoid and other neurotransmitter receptors. As of 2018 the mechanism of action for its biological effects has not been determined.  Side-effects  Adverse reactions: Dry mouth, diarrhea, decreased appetite, fatigue, drowsiness, malaise, weakness, sleep disturbances, and others.  Drug interactions: CBC may interact with other  medications such as blood-thinners. Because CBD causes drowsiness on its own, it also increases the drowsiness caused by other medications, including antihistamines (such as Benadryl), benzodiazepines (Xanax, Ativan, Valium), antipsychotics, antidepressants and opioids, as well as alcohol and supplements such as kava, melatonin and St. John's Wort. Be cautious with the following combinations:   Brivaracetam (Briviact) Brivaracetam is changed and broken down by the body. CBD might decrease how quickly the body breaks down brivaracetam. This might increase levels of brivaracetam in the body.  Caffeine Caffeine is changed and broken down by the body. CBD might decrease how quickly the body breaks down caffeine. This might increase levels of caffeine in the body.  Carbamazepine (Tegretol) Carbamazepine is changed and broken down by the body. CBD might decrease how quickly the body breaks down carbamazepine. This might increase levels of carbamazepine in the body and increase its side effects.  Citalopram (Celexa) Citalopram is changed and broken down by the body. CBD might decrease how quickly the body breaks down citalopram. This might increase levels of citalopram in the body and increase its side effects.  Clobazam (Onfi) Clobazam is changed and broken down by the liver. CBD might decrease how quickly the liver breaks down clobazam. This might increase the effects and side effects of clobazam.  Eslicarbazepine (Aptiom) Eslicarbazepine is changed and broken down by the body. CBD might decrease how quickly the body breaks down eslicarbazepine. This might increase levels of eslicarbazepine in the body by a small amount.  Everolimus (Zostress) Everolimus is changed and broken down by the body. CBD might decrease how quickly the body breaks down everolimus. This might increase levels of everolimus in the body.  Lithium Taking higher doses of CBD might increase levels of lithium. This can increase  the risk of lithium toxicity.  Medications changed by the liver (Cytochrome P450 1A1 (CYP1A1) substrates) Some medications are changed and broken down by the liver. CBD might change how quickly the liver breaks down these medications. This could change the effects and side effects of these medications.  Medications changed by the liver (Cytochrome P450 1A2 (CYP1A2) substrates) Some medications are   changed and broken down by the liver. CBD might change how quickly the liver breaks down these medications. This could change the effects and side effects of these medications.  Medications changed by the liver (Cytochrome P450 1B1 (CYP1B1) substrates) Some medications are changed and broken down by the liver. CBD might change how quickly the liver breaks down these medications. This could change the effects and side effects of these medications.  Medications changed by the liver (Cytochrome P450 2A6 (CYP2A6) substrates) Some medications are changed and broken down by the liver. CBD might change how quickly the liver breaks down these medications. This could change the effects and side effects of these medications.  Medications changed by the liver (Cytochrome P450 2B6 (CYP2B6) substrates) Some medications are changed and broken down by the liver. CBD might change how quickly the liver breaks down these medications. This could change the effects and side effects of these medications.  Medications changed by the liver (Cytochrome P450 2C19 (CYP2C19) substrates) Some medications are changed and broken down by the liver. CBD might change how quickly the liver breaks down these medications. This could change the effects and side effects of these medications.  Medications changed by the liver (Cytochrome P450 2C8 (CYP2C8) substrates) Some medications are changed and broken down by the liver. CBD might change how quickly the liver breaks down these medications. This could change the effects and side effects  of these medications.  Medications changed by the liver (Cytochrome P450 2C9 (CYP2C9) substrates) Some medications are changed and broken down by the liver. CBD might change how quickly the liver breaks down these medications. This could change the effects and side effects of these medications.  Medications changed by the liver (Cytochrome P450 2D6 (CYP2D6) substrates) Some medications are changed and broken down by the liver. CBD might change how quickly the liver breaks down these medications. This could change the effects and side effects of these medications.  Medications changed by the liver (Cytochrome P450 2E1 (CYP2E1) substrates) Some medications are changed and broken down by the liver. CBD might change how quickly the liver breaks down these medications. This could change the effects and side effects of these medications.  Medications changed by the liver (Cytochrome P450 3A4 (CYP3A4) substrates) Some medications are changed and broken down by the liver. CBD might change how quickly the liver breaks down these medications. This could change the effects and side effects of these medications.  Medications changed by the liver (Glucuronidated drugs) Some medications are changed and broken down by the liver. CBD might change how quickly the liver breaks down these medications. This could change the effects and side effects of these medications.  Medications that decrease the breakdown of other medications by the liver (Cytochrome P450 2C19 (CYP2C19) inhibitors) CBD is changed and broken down by the liver. Some drugs decrease how quickly the liver changes and breaks down CBD. This could change the effects and side effects of CBD.  Medications that decrease the breakdown of other medications in the liver (Cytochrome P450 3A4 (CYP3A4) inhibitors) CBD is changed and broken down by the liver. Some drugs decrease how quickly the liver changes and breaks down CBD. This could change the effects  and side effects of CBD.  Medications that increase breakdown of other medications by the liver (Cytochrome P450 3A4 (CYP3A4) inducers) CBD is changed and broken down by the liver. Some drugs increase how quickly the liver changes and breaks down CBD. This could change the effects and side effects of   CBD.  Medications that increase the breakdown of other medications by the liver (Cytochrome P450 2C19 (CYP2C19) inducers) CBD is changed and broken down by the liver. Some drugs increase how quickly the liver changes and breaks down CBD. This could change the effects and side effects of CBD.  Methadone (Dolophine) Methadone is broken down by the liver. CBD might decrease how quickly the liver breaks down methadone. Taking cannabidiol along with methadone might increase the effects and side effects of methadone.  Rufinamide (Banzel) Rufinamide is changed and broken down by the body. CBD might decrease how quickly the body breaks down rufinamide. This might increase levels of rufinamide in the body by a small amount.  Sedative medications (CNS depressants) CBD might cause sleepiness and slowed breathing. Some medications, called sedatives, can also cause sleepiness and slowed breathing. Taking CBD with sedative medications might cause breathing problems and/or too much sleepiness.  Sirolimus (Rapamune) Sirolimus is changed and broken down by the body. CBD might decrease how quickly the body breaks down sirolimus. This might increase levels of sirolimus in the body.  Stiripentol (Diacomit) Stiripentol is changed and broken down by the body. CBD might decrease how quickly the body breaks down stiripentol. This might increase levels of stiripentol in the body and increase its side effects.  Tacrolimus (Prograf) Tacrolimus is changed and broken down by the body. CBD might decrease how quickly the body breaks down tacrolimus. This might increase levels of tacrolimus in the body.  Tamoxifen  (Soltamox) Tamoxifen is changed and broken down by the body. CBD might affect how quickly the body breaks down tamoxifen. This might affect levels of tamoxifen in the body.  Topiramate (Topamax) Topiramate is changed and broken down by the body. CBD might decrease how quickly the body breaks down topiramate. This might increase levels of topiramate in the body by a small amount.  Valproate Valproic acid can cause liver injury. Taking cannabidiol with valproic acid might increase the chance of liver injury. CBD and/or valproic acid might need to be stopped, or the dose might need to be reduced.  Warfarin (Coumadin) CBD might increase levels of warfarin, which can increase the risk for bleeding. CBD and/or warfarin might need to be stopped, or the dose might need to be reduced.  Zonisamide Zonisamide is changed and broken down by the body. CBD might decrease how quickly the body breaks down zonisamide. This might increase levels of zonisamide in the body by a small amount. (Last update: 05/14/2021) ____________________________________________________________________________________________  Naloxone Nasal Spray What is this medication? NALOXONE (nal OX one) treats opioid overdose, which causes slow or shallow breathing, severe drowsiness, or trouble staying awake. Call emergency services after using this medication. You may need additional treatment. Naloxone works by reversing the effects of opioids. It belongs to a group of medications called opioid blockers. This medicine may be used for other purposes; ask your health care provider or pharmacist if you have questions. COMMON BRAND NAME(S): Kloxxado, Narcan What should I tell my care team before I take this medication? They need to know if you have any of these conditions: Heart disease Substance use disorder An unusual or allergic reaction to naloxone, other medications, foods, dyes, or preservatives Pregnant or trying to get  pregnant Breast-feeding How should I use this medication? This medication is for use in the nose. Lay the person on their back. Support their neck with your hand and allow the head to tilt back before giving the medication. The nasal spray should be given into   1 nostril. After giving the medication, move the person onto their side. Do not remove or test the nasal spray until ready to use. Get emergency medical help right away after giving the first dose of this medication, even if the person wakes up. You should be familiar with how to recognize the signs and symptoms of a narcotic overdose. If more doses are needed, give the additional dose in the other nostril. Talk to your care team about the use of this medication in children. While this medication may be prescribed for children as young as newborns for selected conditions, precautions do apply. Overdosage: If you think you have taken too much of this medicine contact a poison control center or emergency room at once. NOTE: This medicine is only for you. Do not share this medicine with others. What if I miss a dose? This does not apply. What may interact with this medication? This is only used during an emergency. No interactions are expected during emergency use. This list may not describe all possible interactions. Give your health care provider a list of all the medicines, herbs, non-prescription drugs, or dietary supplements you use. Also tell them if you smoke, drink alcohol, or use illegal drugs. Some items may interact with your medicine. What should I watch for while using this medication? Keep this medication ready for use in the case of an opioid overdose. Make sure that you have the phone number of your care team and local hospital ready. You may need to have additional doses of this medication. Each nasal spray contains a single dose. Some emergencies may require additional doses. After use, bring the treated person to the nearest  hospital or call 911. Make sure the treating care team knows that the person has received a dose of this medication. You will receive additional instructions on what to do during and after use of this medication before an emergency occurs. What side effects may I notice from receiving this medication? Side effects that you should report to your care team as soon as possible: Allergic reactions--skin rash, itching, hives, swelling of the face, lips, tongue, or throat Side effects that usually do not require medical attention (report these to your care team if they continue or are bothersome): Constipation Dryness or irritation inside the nose Headache Increase in blood pressure Muscle spasms Stuffy nose Toothache This list may not describe all possible side effects. Call your doctor for medical advice about side effects. You may report side effects to FDA at 1-800-FDA-1088. Where should I keep my medication? Keep out of the reach of children and pets. Store between 20 and 25 degrees C (68 and 77 degrees F). Do not Stonesifer. Throw away any unused medication after the expiration date. Keep in original box until ready to use. NOTE: This sheet is a summary. It may not cover all possible information. If you have questions about this medicine, talk to your doctor, pharmacist, or health care provider.  2023 [employer]vier/Gold Standard (2020-11-08 00:00:00)    ______________________________________________________________________  Preparing for your procedure  During your procedure appointment there will be: No Prescription Refills. No disability issues to discussed. No medication changes or discussions.  Instructions: Food intake: Avoid eating anything solid for at least 8 hours prior to your procedure. Clear liquid intake: You may take clear liquids such as water up to 2 hours prior to your procedure. (No carbonated drinks. No soda.) Transportation: Unless otherwise stated by your physician,  bring a driver. Morning Medicines: Except for blood thinners,  take all of your other morning medications with a sip of water. Make sure to take your heart and blood pressure medicines. If your blood pressure's lower number is above 100, the case will be rescheduled. Blood thinners: If you take a blood thinner, but were not instructed to stop it, call our office [phone] and ask to talk to a nurse. Not stopping a blood thinner prior to certain procedures could lead to serious complications. Diabetics on insulin: Notify the staff so that you can be scheduled 1st case in the morning. If your diabetes requires high dose insulin, take only  of your normal insulin dose the morning of the procedure and notify the staff that you have done so. Preventing infections: Shower with an antibacterial soap the morning of your procedure.  Build-up your immune system: Take 1000 mg of Vitamin C with every meal (3 times a day) the day prior to your procedure. Antibiotics: Inform the nursing staff if you are taking any antibiotics or if you have any conditions that may require antibiotics prior to procedures. (Example: recent joint implants)   Pregnancy: If you are pregnant make sure to notify the nursing staff. Not doing so may result in injury to the fetus, including death.  Sickness: If you have a cold, fever, or any active infections, call and cancel or reschedule your procedure. Receiving steroids while having an infection may result in complications. Arrival: You must be in the facility at least 30 minutes prior to your scheduled procedure. Tardiness: Your scheduled time is also the cutoff time. If you do not arrive at least 15 minutes prior to your procedure, you will be rescheduled.  Children: Do not bring any children with you. Make arrangements to keep them home. Dress appropriately: There is always a possibility that your clothing may get soiled. Avoid long dresses. Valuables: Do not bring any jewelry or  valuables.  Reasons to call and reschedule or cancel your procedure: (Following these recommendations will minimize the risk of a serious complication.) Surgeries: Avoid having procedures within 2 weeks of any surgery. (Avoid for 2 weeks before or after any surgery). Flu Shots: Avoid having procedures within 2 weeks of a flu shots or . (Avoid for 2 weeks before or after immunizations). Barium: Avoid having a procedure within 7-10 days after having had a radiological study involving the use of radiological contrast. (Myelograms, Barium swallow or enema study). Heart attacks: Avoid any elective procedures or surgeries for the initial 6 months after a "Myocardial Infarction" (Heart Attack). Blood thinners: It is imperative that you stop these medications before procedures. Let us know if you if you take any blood thinner.  Infection: Avoid procedures during or within two weeks of an infection (including chest colds or gastrointestinal problems). Symptoms associated with infections include: Localized redness, fever, chills, night sweats or profuse sweating, burning sensation when voiding, cough, congestion, stuffiness, runny nose, sore throat, diarrhea, nausea, vomiting, cold or Flu symptoms, recent or current infections. It is specially important if the infection is over the area that we intend to treat. Heart and lung problems: Symptoms that may suggest an active cardiopulmonary problem include: cough, chest pain, breathing difficulties or shortness of breath, dizziness, ankle swelling, uncontrolled high or unusually low blood pressure, and/or palpitations. If you are experiencing any of these symptoms, cancel your procedure and contact your primary care physician for an evaluation.  Remember:  Regular Business hours are:  Monday to Thursday 8:00 AM to 4:00 PM  Provider's Schedule: Milinda Pointer, MD:  Procedure days: Tuesday and Thursday 7:30 AM to 4:00 PM  Gillis Santa, MD:  Procedure days:  Monday and Wednesday 7:30 AM to 4:00 PM  ______________________________________________________________________  ____________________________________________________________________________________________  General Risks and Possible Complications  Patient Responsibilities: It is important that you read this as it is part of your informed consent. It is our duty to inform you of the risks and possible complications associated with treatments offered to you. It is your responsibility as a patient to read this and to ask questions about anything that is not clear or that you believe was not covered in this document.  Patient's Rights: You have the right to refuse treatment. You also have the right to change your mind, even after initially having agreed to have the treatment done. However, under this last option, if you wait until the last second to change your mind, you may be charged for the materials used up to that point.  Introduction: Medicine is not an Chief Strategy Officer. Everything in Medicine, including the lack of treatment(s), carries the potential for danger, harm, or loss (which is by definition: Risk). In Medicine, a complication is a secondary problem, condition, or disease that can aggravate an already existing one. All treatments carry the risk of possible complications. The fact that a side effects or complications occurs, does not imply that the treatment was conducted incorrectly. It must be clearly understood that these can happen even when everything is done following the highest safety standards.  No treatment: You can choose not to proceed with the proposed treatment alternative. The "PRO(s)" would include: avoiding the risk of complications associated with the therapy. The "CON(s)" would include: not getting any of the treatment benefits. These benefits fall under one of three categories: diagnostic; therapeutic; and/or palliative. Diagnostic benefits include: getting information which  can ultimately lead to improvement of the disease or symptom(s). Therapeutic benefits are those associated with the successful treatment of the disease. Finally, palliative benefits are those related to the decrease of the primary symptoms, without necessarily curing the condition (example: decreasing the pain from a flare-up of a chronic condition, such as incurable terminal cancer).  General Risks and Complications: These are associated to most interventional treatments. They can occur alone, or in combination. They fall under one of the following six (6) categories: no benefit or worsening of symptoms; bleeding; infection; nerve damage; allergic reactions; and/or death. No benefits or worsening of symptoms: In Medicine there are no guarantees, only probabilities. No healthcare provider can ever guarantee that a medical treatment will work, they can only state the probability that it may. Furthermore, there is always the possibility that the condition may worsen, either directly, or indirectly, as a consequence of the treatment. Bleeding: This is more common if the patient is taking a blood thinner, either prescription or over the counter (example: Goody Powders, Fish oil, Aspirin, Garlic, etc.), or if suffering a condition associated with impaired coagulation (example: Hemophilia, cirrhosis of the liver, low platelet counts, etc.). However, even if you do not have one on these, it can still happen. If you have any of these conditions, or take one of these drugs, make sure to notify your treating physician. Infection: This is more common in patients with a compromised immune system, either due to disease (example: diabetes, cancer, human immunodeficiency virus [HIV], etc.), or due to medications or treatments (example: therapies used to treat cancer and rheumatological diseases). However, even if you do not have one on these, it can still happen. If you have  any of these conditions, or take one of these  drugs, make sure to notify your treating physician. Nerve Damage: This is more common when the treatment is an invasive one, but it can also happen with the use of medications, such as those used in the treatment of cancer. The damage can occur to small secondary nerves, or to large primary ones, such as those in the spinal cord and brain. This damage may be temporary or permanent and it may lead to impairments that can range from temporary numbness to permanent paralysis and/or brain death. Allergic Reactions: Any time a substance or material comes in contact with our body, there is the possibility of an allergic reaction. These can range from a mild skin rash (contact dermatitis) to a severe systemic reaction (anaphylactic reaction), which can result in death. Death: In general, any medical intervention can result in death, most of the time due to an unforeseen complication. ____________________________________________________________________________________________   ____________________________________________________________________________________________  Pain Prevention Technique  Definition:   A technique used to minimize the effects of an activity known to cause inflammation or swelling, which in turn leads to an increase in pain.  Purpose: To prevent swelling from occurring. It is based on the fact that it is easier to prevent swelling from happening than it is to get rid of it, once it occurs.  Contraindications: Anyone with allergy or hypersensitivity to the recommended medications. Anyone taking anticoagulants (Blood Thinners) (e.g., Coumadin, Warfarin, Plavix, etc.). Patients in Renal Failure.  Technique: Before you undertake an activity known to cause pain, or a flare-up of your chronic pain, and before you experience any pain, do the following:  On a full stomach, take 4 (four) over the counter Ibuprofens '200mg'$  tablets (Motrin), for a total of 800 mg. In addition, take over  the counter Magnesium 400 to 500 mg, before doing the activity.  Six (6) hours later, again on a full stomach, repeat the Ibuprofen. That night, take a warm shower and stretch under the running warm water.  This technique may be sufficient to abort the pain and discomfort before it happens. Keep in mind that it takes a lot less medication to prevent swelling than it takes to eliminate it once it occurs.  ____________________________________________________________________________________________

## 2022-01-14 ENCOUNTER — Ambulatory Visit (INDEPENDENT_AMBULATORY_CARE_PROVIDER_SITE_OTHER): Payer: HMO | Admitting: Surgery

## 2022-01-14 ENCOUNTER — Encounter: Payer: Self-pay | Admitting: Surgery

## 2022-01-14 ENCOUNTER — Other Ambulatory Visit: Payer: Self-pay

## 2022-01-14 VITALS — BP 157/84 | HR 112 | Temp 98.7°F | Wt 246.0 lb

## 2022-01-14 DIAGNOSIS — E349 Endocrine disorder, unspecified: Secondary | ICD-10-CM

## 2022-01-14 DIAGNOSIS — K449 Diaphragmatic hernia without obstruction or gangrene: Secondary | ICD-10-CM

## 2022-01-14 NOTE — Telephone Encounter (Signed)
Patient has been scheduled for Nov 9th at 1PM.

## 2022-01-14 NOTE — Patient Instructions (Signed)
Our surgery scheduler Pamala Hurry will call you within 24-48 hours to get you scheduled. If you have not heard from her after 48 hours, please call our office. Have the blue sheet available when she calls to write down important information.   If you have any concerns or questions, please feel free to call our office.   Laparoscopic Nissen Fundoplication  Laparoscopic Nissen fundoplication is a surgery to relieve heartburn and other problems caused by fluid from your stomach (gastric fluids) flowing up into your esophagus. The esophagus is the part of the body that moves food from the mouth to the stomach. Normally, the muscle that sits between the stomach and the esophagus (lower esophageal sphincter, LES) keeps stomach fluids in the stomach. In some people, the LES does not work properly, and stomach fluids flow up into the esophagus (reflux). This can happen when part of the stomach bulges through the LES (hiatal hernia). The backward flow of stomach fluids can cause a type of severe and long-lasting heartburn that is called gastroesophageal reflux disease (GERD). You may need this surgery if other treatments for GERD have not helped. In this procedure, the upper part of your stomach is wrapped around the lower end of your esophagus and stitched together (sutured). This tightens the connection between your esophagus and stomach to prevent stomach acid reflux. Tell a health care provider about: Any allergies you have. All medicines you are taking, including vitamins, herbs, eye drops, creams, and over-the-counter medicines. Any problems you or family members have had with anesthetic medicines. Any blood disorders you have. Any surgeries you have had. Any medical conditions you have. Whether you are pregnant or may be pregnant. What are the risks? Generally, this is a safe procedure. However, problems may occur, including: Infection. Bleeding. Damage to other structures or organs. This can include  damage to the lung, causing a collapsed lung. Trouble swallowing (dysphagia). Blood clots. Allergic reactions to medicines. What happens before the procedure? Staying hydrated Follow instructions from your health care provider about hydration, which may include: Up to two hours before the procedure - you may continue to drink clear liquids, such as water, clear fruit juice, black coffee and plain tea. Eating and drinking restrictions Follow instructions from your health care provider about eating and drinking, which may include: 8 hours before the procedure - stop eating heavy meals or foods, such as meat, fried foods, or fatty foods. 6 hours before the procedure - stop eating light meals or foods, such as toast or cereal. 6 hours before the procedure - stop drinking milk or drinks that contain milk. 2 hours before the procedure - stop drinking clear liquids. Medicines Ask your health care provider about: Changing or stopping your regular medicines. This is especially important if you are taking diabetes medicines or blood thinners. Taking medicines such as aspirin and ibuprofen. These medicines can thin your blood. Do not take these medicines unless your health care provider tells you to take them. Taking over-the-counter medicines, vitamins, herbs, and supplements. Tests Your health care provider will do tests to plan the procedure. This may include: An exam using a flexible scope passed down your esophagus into your stomach (endoscopy). Imaging studies. General instructions Plan to have a responsible adult take you home from the hospital or clinic. Ask your health care provider: How your surgery site will be marked. What steps will be taken to help prevent infection. These steps may include: Removing hair at the surgery site. Washing skin with a germ-killing soap.  Taking antibiotic medicine. Do not use any products that contain nicotine or tobacco for at least 4 weeks before the  procedure. These products include cigarettes, chewing tobacco, and vaping devices, such as e-cigarettes. If you need help quitting, ask your health care provider. What happens during the procedure? An IV will be inserted into one of your veins. You will be given medicine in your IV to help you relax (sedative) just before the procedure and a medicine to make you fall asleep (general anesthetic). You may have a tube placed through your nose into your stomach to drain stomach acid during the procedure (nasogastric tube). The surgeon will make a small incision in your abdomen and insert a tube through the incision. Your abdomen will be filled with a gas. This helps the surgeon see your organs better, and it makes more space to work. The surgeon will insert a thin, lighted tube (laparoscope) through the small incision. This allows your surgeon to see into your abdomen. The surgeon will make several other small incisions in your abdomen to insert the other instruments that are needed during the procedure. Another instrument (dilator) will be passed through your mouth and down your esophagus into the upper part of your stomach. The dilator will prevent your LES from being closed too tightly during surgery. The upper part of your stomach will be wrapped around the lower part of your esophagus and will be stitched into place. This will strengthen the lower esophageal sphincter and prevent reflux. If you have a hiatal hernia, it will be repaired. The gas will be released from your abdomen. All instruments will be removed, and the incisions will be closed with stitches (sutures). A bandage (dressing) will be placed on your skin over the incisions. The procedure may vary among health care providers and hospitals. What happens after the procedure? Your blood pressure, heart rate, breathing rate, and blood oxygen level will be monitored until you leave the hospital or clinic. You will be given pain medicine as  needed. Your IV will be kept in until you are able to drink fluids. You will be encouraged to get up and walk around as soon as possible. Summary Laparoscopic Nissen fundoplication is a surgery to relieve heartburn and other problems caused by gastric fluids flowing up into your esophagus. You may need this surgery if other treatments for GERD have not helped. Follow instructions from your health care provider about eating and drinking before the procedure. Your surgeon will use a thin, lighted tube (laparoscope) that is inserted through a small incision, allowing the surgeon to see into your abdomen. This information is not intended to replace advice given to you by your health care provider. Make sure you discuss any questions you have with your health care provider. Document Revised: 09/17/2019 Document Reviewed: 09/17/2019 Elsevier Patient Education  [place].

## 2022-01-15 NOTE — Progress Notes (Signed)
Outpatient Surgical Follow Up  01/15/2022  Darren Allen is an 56 y.o. male.   Chief Complaint  Patient presents with   Follow-up    Hiatal hernia    HPI: Darren Allen is a 56 y.o. male f/u for paraesophageal hernia.  He has a long history of COPD, dm and currently smokes daily between half a pack and a pack a day.  He has significant pulmonary issues with dyspnea on exertion.   Recently had a barium swallow that I have personally reviewed showing evidence of paraesophageal hernia with significant reflux.  There was no evidence of a stricture. egd confirmed HH , no strictures\\.  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  There is also chronic pulmonary changes.  CBC and CMP is normal except mild elevation of the glucose. He Did have prior history of appendectomy as a child requiring laparotomy. He  had ankle fracture repair by Dr. Posey Pronto. He sees pain clinic on a regular basis  Past Medical History:  Diagnosis Date   Allergy    Calculus of kidney 02/04/2015   COPD (chronic obstructive pulmonary disease) (HCC)    Diabetes mellitus without complication ([place])    type 2   Hypertension    Neuropathy     Past Surgical History:  Procedure Laterality Date   APPENDECTOMY     COLONOSCOPY WITH PROPOFOL N/A 12/31/2021   Procedure: COLONOSCOPY WITH PROPOFOL;  Surgeon: Jonathon Bellows, MD;  Location: [facility] ENDOSCOPY;  Service: Gastroenterology;  Laterality: N/A;   ESOPHAGOGASTRODUODENOSCOPY N/A 12/31/2021   Procedure: ESOPHAGOGASTRODUODENOSCOPY (EGD);  Surgeon: Jonathon Bellows, MD;  Location: [facility] ENDOSCOPY;  Service: Gastroenterology;  Laterality: N/A;   INCISION AND DRAINAGE PERIRECTAL ABSCESS N/A 02/04/2015   Procedure: IRRIGATION AND DEBRIDEMENT PERIRECTAL  ABSCESS;  Surgeon: Marlyce Huge, MD;  Location: [facility] ORS;  Service: General;  Laterality: N/A;   ORIF ANKLE FRACTURE Left 03/23/2017   Procedure: OPEN REDUCTION INTERNAL FIXATION (ORIF) ANKLE FRACTURE;  Surgeon: Leim Fabry, MD;  Location: [facility] ORS;  Service: Orthopedics;  Laterality: Left;   RECTAL EXAM UNDER ANESTHESIA  02/04/2015   Procedure: RECTAL EXAM UNDER ANESTHESIA;  Surgeon: Marlyce Huge, MD;  Location: [facility] ORS;  Service: General;;   SYNDESMOSIS REPAIR Left 03/23/2017   Procedure: SYNDESMOSIS REPAIR;  Surgeon: Leim Fabry, MD;  Location: [facility] ORS;  Service: Orthopedics;  Laterality: Left;    Family History  Problem Relation Age of Onset   Cancer Mother 47       Lung   Cancer Father        Colon   Heart disease Father    Alcohol abuse Father    Cancer Brother 61       Esophageal   Diabetes Brother    Heart disease Brother     Social History:  reports that he has been smoking cigarettes. He has a 34.00 pack-year smoking history. He has never used smokeless tobacco. He reports that he does not currently use alcohol after a past usage of about 12.0 standard drinks of alcohol per week. He reports that he does not use drugs.  Allergies:  Allergies  Allergen Reactions   Glipizide Other (See Comments) and Palpitations    Shaky, feel bad Other reaction(s): Dizziness   Cephalexin Rash   Duloxetine Anxiety and  Nausea Only    Medications reviewed.    ROS Full ROS performed and is otherwise negative other than what is stated in HPI   BP (!) 157/84   Pulse (!) 112   Temp 98.7 F (37.1 C) (Oral)   Wt 246 lb (111.6 kg)   SpO2 96%   BMI 31.58 kg/m   Physical Exam  CONSTITUTIONAL: NAD. EYES: Pupils are equal, round,  Sclera are non-icteric. EARS, NOSE, MOUTH AND THROAT:  The oral mucosa is pink and moist. Hearing is intact to voice. RESPIRATORY:  Lungs with wheezes and rales. There is normal respiratory effort, with equal breath sounds bilaterally,  and without pathologic use of accessory muscles. CARDIOVASCULAR: Heart is regular without murmurs, gallops, or rubs. GI: The abdomen is  soft, nontender, and nondistended. Reducible 2 cms umbilical hernia, There are no palpable masses. There is no hepatosplenomegaly. There are normal bowel sounds  GU: Rectal deferred.   MUSCULOSKELETAL: Normal muscle strength and tone. No cyanosis or edema.   SKIN: Turgor is good and there are no pathologic skin lesions or ulcers. NEUROLOGIC: Motor and sensation is grossly normal. Cranial nerves are grossly intact. PSYCH:  Oriented to person, place and time. Affect is normal.   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   I do think that the hernia is interfering with his pulmonary issues.  Patient had a reducible umbilical hernia that I think can be fixed at the same operative setting.   I discussed with him the role for antireflux procedure in the setting of paraesophageal hernia.  I do think that repair is indicated and then he will be a good candidate for robotic approach.  Procedure discussed with him and his wife in detail.  Risks, benefits and possible occasions including but not limited to: Bleeding, infection, bowel or esophageal perforations, recurrence, chronic pain.  He does have also chronic pain that we will anticipate to give and most 1 refills from narcotics.  I was very frank and upfront about my plan.  He continues to follow with the pain clinic. I spent 40 minutes in this encounter including personally reviewing imaging studies, medical records, placing orders, counseling the patient and performing appropriate documentation  Caroleen Hamman, MD [place] Surgeon

## 2022-01-15 NOTE — H&P (View-Only) (Signed)
Outpatient Surgical Follow Up  01/15/2022  NICLAS Allen is an 56 y.o. male.   Chief Complaint  Patient presents with   Follow-up    Hiatal hernia    HPI: Darren Allen is a 56 y.o. male f/u for paraesophageal hernia.  He has a long history of COPD, dm and currently smokes daily between half a pack and a pack a day.  He has significant pulmonary issues with dyspnea on exertion.   Recently had a barium swallow that I have personally reviewed showing evidence of paraesophageal hernia with significant reflux.  There was no evidence of a stricture. egd confirmed HH , no strictures\\.  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  There is also chronic pulmonary changes.  CBC and CMP is normal except mild elevation of the glucose. He Did have prior history of appendectomy as a child requiring laparotomy. He  had ankle fracture repair by Dr. Posey Pronto. He sees pain clinic on a regular basis  Past Medical History:  Diagnosis Date   Allergy    Calculus of kidney 02/04/2015   COPD (chronic obstructive pulmonary disease) (HCC)    Diabetes mellitus without complication ([place])    type 2   Hypertension    Neuropathy     Past Surgical History:  Procedure Laterality Date   APPENDECTOMY     COLONOSCOPY WITH PROPOFOL N/A 12/31/2021   Procedure: COLONOSCOPY WITH PROPOFOL;  Surgeon: Jonathon Bellows, MD;  Location: [facility] ENDOSCOPY;  Service: Gastroenterology;  Laterality: N/A;   ESOPHAGOGASTRODUODENOSCOPY N/A 12/31/2021   Procedure: ESOPHAGOGASTRODUODENOSCOPY (EGD);  Surgeon: Jonathon Bellows, MD;  Location: [facility] ENDOSCOPY;  Service: Gastroenterology;  Laterality: N/A;   INCISION AND DRAINAGE PERIRECTAL ABSCESS N/A 02/04/2015   Procedure: IRRIGATION AND DEBRIDEMENT PERIRECTAL  ABSCESS;  Surgeon: Marlyce Huge, MD;  Location: [facility] ORS;  Service: General;  Laterality: N/A;   ORIF ANKLE FRACTURE Left 03/23/2017   Procedure: OPEN REDUCTION INTERNAL FIXATION (ORIF) ANKLE FRACTURE;  Surgeon: Leim Fabry, MD;  Location: [facility] ORS;  Service: Orthopedics;  Laterality: Left;   RECTAL EXAM UNDER ANESTHESIA  02/04/2015   Procedure: RECTAL EXAM UNDER ANESTHESIA;  Surgeon: Marlyce Huge, MD;  Location: [facility] ORS;  Service: General;;   SYNDESMOSIS REPAIR Left 03/23/2017   Procedure: SYNDESMOSIS REPAIR;  Surgeon: Leim Fabry, MD;  Location: [facility] ORS;  Service: Orthopedics;  Laterality: Left;    Family History  Problem Relation Age of Onset   Cancer Mother 18       Lung   Cancer Father        Colon   Heart disease Father    Alcohol abuse Father    Cancer Brother 70       Esophageal   Diabetes Brother    Heart disease Brother     Social History:  reports that he has been smoking cigarettes. He has a 34.00 pack-year smoking history. He has never used smokeless tobacco. He reports that he does not currently use alcohol after a past usage of about 12.0 standard drinks of alcohol per week. He reports that he does not use drugs.  Allergies:  Allergies  Allergen Reactions   Glipizide Other (See Comments) and Palpitations    Shaky, feel bad Other reaction(s): Dizziness   Cephalexin Rash   Duloxetine Anxiety and  Nausea Only    Medications reviewed.    ROS Full ROS performed and is otherwise negative other than what is stated in HPI   BP (!) 157/84   Pulse (!) 112   Temp 98.7 F (37.1 C) (Oral)   Wt 246 lb (111.6 kg)   SpO2 96%   BMI 31.58 kg/m   Physical Exam  CONSTITUTIONAL: NAD. EYES: Pupils are equal, round,  Sclera are non-icteric. EARS, NOSE, MOUTH AND THROAT:  The oral mucosa is pink and moist. Hearing is intact to voice. RESPIRATORY:  Lungs with wheezes and rales. There is normal respiratory effort, with equal breath sounds bilaterally,  and without pathologic use of accessory muscles. CARDIOVASCULAR: Heart is regular without murmurs, gallops, or rubs. GI: The abdomen is  soft, nontender, and nondistended. Reducible 2 cms umbilical hernia, There are no palpable masses. There is no hepatosplenomegaly. There are normal bowel sounds  GU: Rectal deferred.   MUSCULOSKELETAL: Normal muscle strength and tone. No cyanosis or edema.   SKIN: Turgor is good and there are no pathologic skin lesions or ulcers. NEUROLOGIC: Motor and sensation is grossly normal. Cranial nerves are grossly intact. PSYCH:  Oriented to person, place and time. Affect is normal.   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   I do think that the hernia is interfering with his pulmonary issues.  Patient had a reducible umbilical hernia that I think can be fixed at the same operative setting.   I discussed with him the role for antireflux procedure in the setting of paraesophageal hernia.  I do think that repair is indicated and then he will be a good candidate for robotic approach.  Procedure discussed with him and his wife in detail.  Risks, benefits and possible occasions including but not limited to: Bleeding, infection, bowel or esophageal perforations, recurrence, chronic pain.  He does have also chronic pain that we will anticipate to give and most 1 refills from narcotics.  I was very frank and upfront about my plan.  He continues to follow with the pain clinic. I spent 40 minutes in this encounter including personally reviewing imaging studies, medical records, placing orders, counseling the patient and performing appropriate documentation  Caroleen Hamman, MD [place] Surgeon

## 2022-01-16 ENCOUNTER — Telehealth: Payer: Self-pay | Admitting: Surgery

## 2022-01-16 NOTE — Telephone Encounter (Signed)
Patient and fiance have been advised of Pre-Admission date/time, and Surgery date at [facility]  Surgery Date: 01/20/22 Preadmission Testing Date: 01/20/22 (2 hours early)  Patient has been made aware to call [phone], between 1-3:00pm the day before surgery, to find out what time to arrive for surgery.

## 2022-01-19 MED ORDER — GABAPENTIN 300 MG PO CAPS: 300.0000 mg | ORAL_CAPSULE | ORAL | Status: DC

## 2022-01-19 MED ORDER — SODIUM CHLORIDE 0.9 % IV SOLN: INTRAVENOUS | Status: DC

## 2022-01-19 MED ORDER — ORAL CARE MOUTH RINSE: 15.0000 mL | Freq: Once | OROMUCOSAL | Status: AC

## 2022-01-19 MED ORDER — CHLORHEXIDINE GLUCONATE CLOTH 2 % EX PADS
6.0000 | MEDICATED_PAD | Freq: Once | CUTANEOUS | Status: AC
  Administered 2022-01-20: 6 via TOPICAL

## 2022-01-19 MED ORDER — CHLORHEXIDINE GLUCONATE CLOTH 2 % EX PADS: 6.0000 | MEDICATED_PAD | Freq: Once | CUTANEOUS | Status: DC

## 2022-01-19 MED ORDER — VANCOMYCIN HCL 1500 MG/300ML IV SOLN
1500.0000 mg | INTRAVENOUS | Status: AC
  Administered 2022-01-20 (×2): 1500 mg via INTRAVENOUS
  Filled 2022-01-19: qty 300

## 2022-01-19 MED ORDER — CHLORHEXIDINE GLUCONATE 0.12 % MT SOLN: 15.0000 mL | Freq: Once | OROMUCOSAL | Status: AC

## 2022-01-19 MED ORDER — ACETAMINOPHEN 500 MG PO TABS: 1000.0000 mg | ORAL_TABLET | ORAL | Status: AC

## 2022-01-20 ENCOUNTER — Ambulatory Visit: Payer: HMO

## 2022-01-20 ENCOUNTER — Inpatient Hospital Stay
Admission: RE | Admit: 2022-01-20 | Discharge: 2022-01-25 | DRG: 327 | Disposition: A | Discharge status: Home / Self Care | Payer: HMO | Attending: Surgery | Admitting: Surgery

## 2022-01-20 ENCOUNTER — Other Ambulatory Visit: Payer: HMO

## 2022-01-20 ENCOUNTER — Encounter: Payer: Self-pay | Admitting: Surgery

## 2022-01-20 ENCOUNTER — Encounter
Admission: RE | Disposition: A | Discharge status: Home / Self Care | Payer: Self-pay | Source: Home / Self Care | Attending: Surgery

## 2022-01-20 ENCOUNTER — Ambulatory Visit: Payer: HMO | Admitting: Pain Medicine

## 2022-01-20 ENCOUNTER — Other Ambulatory Visit: Payer: Self-pay

## 2022-01-20 DIAGNOSIS — E785 Hyperlipidemia, unspecified: Secondary | ICD-10-CM | POA: Diagnosis present

## 2022-01-20 DIAGNOSIS — N179 Acute kidney failure, unspecified: Secondary | ICD-10-CM | POA: Diagnosis not present

## 2022-01-20 DIAGNOSIS — Z794 Long term (current) use of insulin: Secondary | ICD-10-CM | POA: Diagnosis not present

## 2022-01-20 DIAGNOSIS — E875 Hyperkalemia: Secondary | ICD-10-CM

## 2022-01-20 DIAGNOSIS — F419 Anxiety disorder, unspecified: Secondary | ICD-10-CM | POA: Diagnosis present

## 2022-01-20 DIAGNOSIS — Z8249 Family history of ischemic heart disease and other diseases of the circulatory system: Secondary | ICD-10-CM

## 2022-01-20 DIAGNOSIS — I129 Hypertensive chronic kidney disease with stage 1 through stage 4 chronic kidney disease, or unspecified chronic kidney disease: Secondary | ICD-10-CM | POA: Diagnosis present

## 2022-01-20 DIAGNOSIS — R0902 Hypoxemia: Secondary | ICD-10-CM | POA: Diagnosis not present

## 2022-01-20 DIAGNOSIS — Z7985 Long-term (current) use of injectable non-insulin antidiabetic drugs: Secondary | ICD-10-CM

## 2022-01-20 DIAGNOSIS — K429 Umbilical hernia without obstruction or gangrene: Secondary | ICD-10-CM

## 2022-01-20 DIAGNOSIS — K219 Gastro-esophageal reflux disease without esophagitis: Secondary | ICD-10-CM | POA: Diagnosis present

## 2022-01-20 DIAGNOSIS — M351 Other overlap syndromes: Secondary | ICD-10-CM | POA: Diagnosis present

## 2022-01-20 DIAGNOSIS — Z8719 Personal history of other diseases of the digestive system: Secondary | ICD-10-CM | POA: Diagnosis not present

## 2022-01-20 DIAGNOSIS — E290 Testicular hyperfunction: Secondary | ICD-10-CM | POA: Diagnosis present

## 2022-01-20 DIAGNOSIS — D62 Acute posthemorrhagic anemia: Secondary | ICD-10-CM

## 2022-01-20 DIAGNOSIS — E872 Acidosis, unspecified: Secondary | ICD-10-CM | POA: Diagnosis not present

## 2022-01-20 DIAGNOSIS — K449 Diaphragmatic hernia without obstruction or gangrene: Secondary | ICD-10-CM | POA: Diagnosis present

## 2022-01-20 DIAGNOSIS — E1122 Type 2 diabetes mellitus with diabetic chronic kidney disease: Secondary | ICD-10-CM | POA: Diagnosis present

## 2022-01-20 DIAGNOSIS — Z79899 Other long term (current) drug therapy: Secondary | ICD-10-CM

## 2022-01-20 DIAGNOSIS — Z888 Allergy status to other drugs, medicaments and biological substances status: Secondary | ICD-10-CM

## 2022-01-20 DIAGNOSIS — F17210 Nicotine dependence, cigarettes, uncomplicated: Secondary | ICD-10-CM | POA: Diagnosis present

## 2022-01-20 DIAGNOSIS — Z7984 Long term (current) use of oral hypoglycemic drugs: Secondary | ICD-10-CM

## 2022-01-20 DIAGNOSIS — E1165 Type 2 diabetes mellitus with hyperglycemia: Secondary | ICD-10-CM | POA: Diagnosis present

## 2022-01-20 DIAGNOSIS — E1159 Type 2 diabetes mellitus with other circulatory complications: Secondary | ICD-10-CM | POA: Diagnosis present

## 2022-01-20 DIAGNOSIS — E1140 Type 2 diabetes mellitus with diabetic neuropathy, unspecified: Secondary | ICD-10-CM | POA: Diagnosis not present

## 2022-01-20 DIAGNOSIS — R0603 Acute respiratory distress: Secondary | ICD-10-CM | POA: Diagnosis not present

## 2022-01-20 DIAGNOSIS — Z7951 Long term (current) use of inhaled steroids: Secondary | ICD-10-CM

## 2022-01-20 DIAGNOSIS — G4733 Obstructive sleep apnea (adult) (pediatric): Secondary | ICD-10-CM | POA: Diagnosis present

## 2022-01-20 DIAGNOSIS — Z9889 Other specified postprocedural states: Secondary | ICD-10-CM | POA: Diagnosis present

## 2022-01-20 DIAGNOSIS — Z833 Family history of diabetes mellitus: Secondary | ICD-10-CM

## 2022-01-20 DIAGNOSIS — Z79891 Long term (current) use of opiate analgesic: Secondary | ICD-10-CM

## 2022-01-20 DIAGNOSIS — Z800 Family history of malignant neoplasm of digestive organs: Secondary | ICD-10-CM

## 2022-01-20 DIAGNOSIS — R7401 Elevation of levels of liver transaminase levels: Secondary | ICD-10-CM | POA: Diagnosis present

## 2022-01-20 DIAGNOSIS — E669 Obesity, unspecified: Secondary | ICD-10-CM | POA: Diagnosis present

## 2022-01-20 DIAGNOSIS — I10 Essential (primary) hypertension: Secondary | ICD-10-CM | POA: Diagnosis not present

## 2022-01-20 DIAGNOSIS — Z79890 Hormone replacement therapy (postmenopausal): Secondary | ICD-10-CM

## 2022-01-20 DIAGNOSIS — J449 Chronic obstructive pulmonary disease, unspecified: Secondary | ICD-10-CM | POA: Diagnosis not present

## 2022-01-20 DIAGNOSIS — E1151 Type 2 diabetes mellitus with diabetic peripheral angiopathy without gangrene: Secondary | ICD-10-CM | POA: Diagnosis present

## 2022-01-20 DIAGNOSIS — J4489 Other specified chronic obstructive pulmonary disease: Secondary | ICD-10-CM | POA: Diagnosis present

## 2022-01-20 DIAGNOSIS — R000 Tachycardia, unspecified: Secondary | ICD-10-CM | POA: Diagnosis not present

## 2022-01-20 DIAGNOSIS — Z6831 Body mass index (BMI) 31.0-31.9, adult: Secondary | ICD-10-CM

## 2022-01-20 DIAGNOSIS — Z886 Allergy status to analgesic agent status: Secondary | ICD-10-CM

## 2022-01-20 DIAGNOSIS — Z5331 Laparoscopic surgical procedure converted to open procedure: Secondary | ICD-10-CM

## 2022-01-20 DIAGNOSIS — Z87442 Personal history of urinary calculi: Secondary | ICD-10-CM

## 2022-01-20 DIAGNOSIS — E1142 Type 2 diabetes mellitus with diabetic polyneuropathy: Secondary | ICD-10-CM | POA: Diagnosis present

## 2022-01-20 DIAGNOSIS — Z9049 Acquired absence of other specified parts of digestive tract: Secondary | ICD-10-CM

## 2022-01-20 DIAGNOSIS — Z801 Family history of malignant neoplasm of trachea, bronchus and lung: Secondary | ICD-10-CM

## 2022-01-20 DIAGNOSIS — J441 Chronic obstructive pulmonary disease with (acute) exacerbation: Secondary | ICD-10-CM | POA: Diagnosis not present

## 2022-01-20 DIAGNOSIS — R162 Hepatomegaly with splenomegaly, not elsewhere classified: Secondary | ICD-10-CM | POA: Diagnosis present

## 2022-01-20 DIAGNOSIS — I152 Hypertension secondary to endocrine disorders: Secondary | ICD-10-CM | POA: Diagnosis present

## 2022-01-20 DIAGNOSIS — R053 Chronic cough: Secondary | ICD-10-CM | POA: Diagnosis present

## 2022-01-20 DIAGNOSIS — Z811 Family history of alcohol abuse and dependence: Secondary | ICD-10-CM

## 2022-01-20 DIAGNOSIS — G894 Chronic pain syndrome: Secondary | ICD-10-CM | POA: Diagnosis present

## 2022-01-20 DIAGNOSIS — N1831 Chronic kidney disease, stage 3a: Secondary | ICD-10-CM | POA: Diagnosis present

## 2022-01-20 DIAGNOSIS — Z881 Allergy status to other antibiotic agents status: Secondary | ICD-10-CM

## 2022-01-20 HISTORY — PX: INSERTION OF MESH: SHX5868

## 2022-01-20 HISTORY — PX: XI ROBOTIC ASSISTED PARAESOPHAGEAL HERNIA REPAIR: SHX6871

## 2022-01-20 HISTORY — PX: UMBILICAL HERNIA REPAIR: SHX196

## 2022-01-20 LAB — CBC
HCT: 35.2 % — ABNORMAL LOW (ref 39.0–52.0)
Hemoglobin: 11.1 g/dL — ABNORMAL LOW (ref 13.0–17.0)
MCH: 27.1 pg (ref 26.0–34.0)
MCHC: 31.5 g/dL (ref 30.0–36.0)
MCV: 85.9 fL (ref 80.0–100.0)
Platelets: 223 10*3/uL (ref 150–400)
RBC: 4.1 MIL/uL — ABNORMAL LOW (ref 4.22–5.81)
RDW: 13.9 % (ref 11.5–15.5)
WBC: 16.7 10*3/uL — ABNORMAL HIGH (ref 4.0–10.5)
nRBC: 0 % (ref 0.0–0.2)

## 2022-01-20 LAB — HEMOGLOBIN A1C
Hgb A1c MFr Bld: 7.2 % — ABNORMAL HIGH (ref 4.8–5.6)
Mean Plasma Glucose: 159.94 mg/dL

## 2022-01-20 LAB — GLUCOSE, CAPILLARY
Glucose-Capillary: 127 mg/dL — ABNORMAL HIGH (ref 70–99)
Glucose-Capillary: 228 mg/dL — ABNORMAL HIGH (ref 70–99)
Glucose-Capillary: 237 mg/dL — ABNORMAL HIGH (ref 70–99)
Glucose-Capillary: 354 mg/dL — ABNORMAL HIGH (ref 70–99)

## 2022-01-20 LAB — CREATININE, SERUM
Creatinine, Ser: 1.82 mg/dL — ABNORMAL HIGH (ref 0.61–1.24)
GFR, Estimated: 43 mL/min — ABNORMAL LOW (ref >60)

## 2022-01-20 LAB — TYPE AND SCREEN
ABO/RH(D): A POS
Antibody Screen: NEGATIVE

## 2022-01-20 SURGERY — REPAIR, HERNIA, PARAESOPHAGEAL, ROBOT-ASSISTED
Anesthesia: General | Site: Abdomen

## 2022-01-20 MED ORDER — PHENYLEPHRINE HCL-NACL 20-0.9 MG/250ML-% IV SOLN
INTRAVENOUS | Status: DC | PRN
  Administered 2022-01-20: 60 ug/min via INTRAVENOUS

## 2022-01-20 MED ORDER — VASOPRESSIN 20 UNIT/ML IV SOLN
INTRAVENOUS | Status: DC | PRN
  Administered 2022-01-20 (×3): 1 [IU] via INTRAVENOUS

## 2022-01-20 MED ORDER — DIPHENHYDRAMINE HCL 12.5 MG/5ML PO ELIX: 12.5000 mg | ORAL_SOLUTION | Freq: Four times a day (QID) | ORAL | Status: DC | PRN

## 2022-01-20 MED ORDER — ONDANSETRON HCL 4 MG/2ML IJ SOLN
INTRAMUSCULAR | Status: AC
  Filled 2022-01-20: qty 6

## 2022-01-20 MED ORDER — UMECLIDINIUM BROMIDE 62.5 MCG/ACT IN AEPB
1.0000 | INHALATION_SPRAY | Freq: Every day | RESPIRATORY_TRACT | Status: DC
  Administered 2022-01-21 – 2022-01-22 (×2): 1 via RESPIRATORY_TRACT
  Filled 2022-01-20: qty 7

## 2022-01-20 MED ORDER — PROMETHAZINE HCL 25 MG/ML IJ SOLN: 6.2500 mg | INTRAMUSCULAR | Status: DC | PRN

## 2022-01-20 MED ORDER — SODIUM CHLORIDE (PF) 0.9 % IJ SOLN
INTRAMUSCULAR | Status: DC | PRN
  Administered 2022-01-20: 100 mL via INTRAMUSCULAR

## 2022-01-20 MED ORDER — GABAPENTIN 600 MG PO TABS
600.0000 mg | ORAL_TABLET | Freq: Three times a day (TID) | ORAL | Status: DC
  Administered 2022-01-20 – 2022-01-21 (×2): 600 mg via ORAL
  Filled 2022-01-20 (×2): qty 1

## 2022-01-20 MED ORDER — ACETAMINOPHEN 500 MG PO TABS
1000.0000 mg | ORAL_TABLET | Freq: Four times a day (QID) | ORAL | Status: DC
  Administered 2022-01-20 – 2022-01-25 (×16): 1000 mg via ORAL
  Filled 2022-01-20 (×17): qty 2

## 2022-01-20 MED ORDER — ESMOLOL HCL 100 MG/10ML IV SOLN
INTRAVENOUS | Status: AC
  Filled 2022-01-20: qty 10

## 2022-01-20 MED ORDER — ALBUMIN HUMAN 5 % IV SOLN: INTRAVENOUS | Status: DC | PRN

## 2022-01-20 MED ORDER — AMITRIPTYLINE HCL 10 MG PO TABS
30.0000 mg | ORAL_TABLET | Freq: Every day | ORAL | Status: DC
  Administered 2022-01-20 – 2022-01-24 (×5): 30 mg via ORAL
  Filled 2022-01-20 (×7): qty 3

## 2022-01-20 MED ORDER — VISTASEAL 10 ML SINGLE DOSE KIT
PACK | CUTANEOUS | Status: AC
  Filled 2022-01-20: qty 10

## 2022-01-20 MED ORDER — HYDROCHLOROTHIAZIDE 25 MG PO TABS
25.0000 mg | ORAL_TABLET | Freq: Every day | ORAL | Status: DC
  Filled 2022-01-20: qty 1

## 2022-01-20 MED ORDER — HEMOSTATIC AGENTS (NO CHARGE) OPTIME
TOPICAL | Status: DC | PRN
  Administered 2022-01-20: 1 via TOPICAL

## 2022-01-20 MED ORDER — SODIUM CHLORIDE 0.9 % IV SOLN: INTRAVENOUS | Status: DC

## 2022-01-20 MED ORDER — LIDOCAINE 5 % EX PTCH
2.0000 | MEDICATED_PATCH | CUTANEOUS | Status: DC
  Administered 2022-01-20 – 2022-01-23 (×4): 2 via TRANSDERMAL
  Filled 2022-01-20 (×5): qty 2

## 2022-01-20 MED ORDER — INSULIN ASPART 100 UNIT/ML IJ SOLN
12.0000 [IU] | Freq: Once | INTRAMUSCULAR | Status: AC
  Administered 2022-01-20: 12 [IU] via SUBCUTANEOUS

## 2022-01-20 MED ORDER — HYDROMORPHONE HCL 1 MG/ML IJ SOLN
INTRAMUSCULAR | Status: AC
  Administered 2022-01-20: 0.5 mg via INTRAVENOUS
  Filled 2022-01-20: qty 1

## 2022-01-20 MED ORDER — SUGAMMADEX SODIUM 500 MG/5ML IV SOLN
INTRAVENOUS | Status: AC
  Filled 2022-01-20: qty 5

## 2022-01-20 MED ORDER — BUPIVACAINE-EPINEPHRINE (PF) 0.25% -1:200000 IJ SOLN
INTRAMUSCULAR | Status: AC
  Filled 2022-01-20: qty 30

## 2022-01-20 MED ORDER — METHOCARBAMOL 1000 MG/10ML IJ SOLN
500.0000 mg | Freq: Three times a day (TID) | INTRAVENOUS | Status: DC | PRN
  Filled 2022-01-20: qty 5

## 2022-01-20 MED ORDER — CHLORHEXIDINE GLUCONATE 0.12 % MT SOLN
OROMUCOSAL | Status: AC
  Administered 2022-01-20: 15 mL via OROMUCOSAL
  Filled 2022-01-20: qty 15

## 2022-01-20 MED ORDER — ONDANSETRON HCL 4 MG/2ML IJ SOLN: 4.0000 mg | Freq: Four times a day (QID) | INTRAMUSCULAR | Status: DC | PRN

## 2022-01-20 MED ORDER — GLYCOPYRROLATE 0.2 MG/ML IJ SOLN
INTRAMUSCULAR | Status: AC
  Filled 2022-01-20: qty 1

## 2022-01-20 MED ORDER — EPHEDRINE SULFATE (PRESSORS) 50 MG/ML IJ SOLN
INTRAMUSCULAR | Status: DC | PRN
  Administered 2022-01-20 (×3): 10 mg via INTRAVENOUS
  Administered 2022-01-20: 5 mg via INTRAVENOUS
  Administered 2022-01-20: 10 mg via INTRAVENOUS

## 2022-01-20 MED ORDER — CYCLOBENZAPRINE HCL 10 MG PO TABS
5.0000 mg | ORAL_TABLET | Freq: Three times a day (TID) | ORAL | Status: DC
  Administered 2022-01-20 – 2022-01-22 (×5): 5 mg via ORAL
  Filled 2022-01-20 (×5): qty 1

## 2022-01-20 MED ORDER — FLUTICASONE FUROATE-VILANTEROL 200-25 MCG/ACT IN AEPB
1.0000 | INHALATION_SPRAY | Freq: Every day | RESPIRATORY_TRACT | Status: DC
  Administered 2022-01-21 – 2022-01-22 (×2): 1 via RESPIRATORY_TRACT
  Filled 2022-01-20: qty 28

## 2022-01-20 MED ORDER — METHOCARBAMOL 500 MG PO TABS
ORAL_TABLET | ORAL | Status: AC
  Filled 2022-01-20: qty 1

## 2022-01-20 MED ORDER — PROPOFOL 10 MG/ML IV BOLUS
INTRAVENOUS | Status: AC
  Filled 2022-01-20: qty 20

## 2022-01-20 MED ORDER — MORPHINE SULFATE (PF) 4 MG/ML IV SOLN
4.0000 mg | INTRAVENOUS | Status: DC | PRN
  Administered 2022-01-20 – 2022-01-21 (×4): 4 mg via INTRAVENOUS
  Filled 2022-01-20 (×4): qty 1

## 2022-01-20 MED ORDER — SUGAMMADEX SODIUM 500 MG/5ML IV SOLN
INTRAVENOUS | Status: DC | PRN
  Administered 2022-01-20: 500 mg via INTRAVENOUS

## 2022-01-20 MED ORDER — FENTANYL CITRATE (PF) 100 MCG/2ML IJ SOLN
25.0000 ug | INTRAMUSCULAR | Status: DC | PRN
  Administered 2022-01-20: 50 ug via INTRAVENOUS

## 2022-01-20 MED ORDER — INSULIN ASPART 100 UNIT/ML IJ SOLN
0.0000 [IU] | Freq: Three times a day (TID) | INTRAMUSCULAR | Status: DC
  Administered 2022-01-21: 4 [IU] via SUBCUTANEOUS
  Administered 2022-01-21 – 2022-01-22 (×2): 7 [IU] via SUBCUTANEOUS
  Administered 2022-01-22: 3 [IU] via SUBCUTANEOUS
  Filled 2022-01-20 (×4): qty 1

## 2022-01-20 MED ORDER — VISTASEAL 10 ML SINGLE DOSE KIT
PACK | CUTANEOUS | Status: DC | PRN
  Administered 2022-01-20: 10 mL via TOPICAL

## 2022-01-20 MED ORDER — VASOPRESSIN 20 UNIT/ML IV SOLN
INTRAVENOUS | Status: AC
  Filled 2022-01-20: qty 1

## 2022-01-20 MED ORDER — PROCHLORPERAZINE MALEATE 10 MG PO TABS: 10.0000 mg | ORAL_TABLET | Freq: Four times a day (QID) | ORAL | Status: DC | PRN

## 2022-01-20 MED ORDER — HYDROMORPHONE HCL 1 MG/ML IJ SOLN
INTRAMUSCULAR | Status: DC | PRN
  Administered 2022-01-20: 1 mg via INTRAVENOUS

## 2022-01-20 MED ORDER — DEXAMETHASONE SODIUM PHOSPHATE 10 MG/ML IJ SOLN
INTRAMUSCULAR | Status: DC | PRN
  Administered 2022-01-20: 10 mg via INTRAVENOUS

## 2022-01-20 MED ORDER — FENTANYL CITRATE (PF) 100 MCG/2ML IJ SOLN
INTRAMUSCULAR | Status: DC | PRN
  Administered 2022-01-20 (×2): 50 ug via INTRAVENOUS

## 2022-01-20 MED ORDER — HYDROMORPHONE HCL 1 MG/ML IJ SOLN
INTRAMUSCULAR | Status: AC
  Filled 2022-01-20: qty 1

## 2022-01-20 MED ORDER — ALBUTEROL SULFATE (2.5 MG/3ML) 0.083% IN NEBU
3.0000 mL | INHALATION_SOLUTION | Freq: Four times a day (QID) | RESPIRATORY_TRACT | Status: DC | PRN
  Administered 2022-01-20: 3 mL via RESPIRATORY_TRACT
  Filled 2022-01-20: qty 3

## 2022-01-20 MED ORDER — KETOROLAC TROMETHAMINE 15 MG/ML IJ SOLN
INTRAMUSCULAR | Status: AC
  Administered 2022-01-20: 15 mg via INTRAVENOUS
  Filled 2022-01-20: qty 1

## 2022-01-20 MED ORDER — KETOROLAC TROMETHAMINE 15 MG/ML IJ SOLN
15.0000 mg | Freq: Four times a day (QID) | INTRAMUSCULAR | Status: DC
  Administered 2022-01-21 (×2): 15 mg via INTRAVENOUS
  Filled 2022-01-20 (×3): qty 1

## 2022-01-20 MED ORDER — DEXMEDETOMIDINE HCL IN NACL 80 MCG/20ML IV SOLN: INTRAVENOUS | Status: DC | PRN

## 2022-01-20 MED ORDER — BUPIVACAINE LIPOSOME 1.3 % IJ SUSP
INTRAMUSCULAR | Status: AC
  Filled 2022-01-20: qty 20

## 2022-01-20 MED ORDER — INSULIN ASPART 100 UNIT/ML IJ SOLN
0.0000 [IU] | Freq: Every day | INTRAMUSCULAR | Status: DC
  Administered 2022-01-20: 5 [IU] via SUBCUTANEOUS
  Filled 2022-01-20: qty 1

## 2022-01-20 MED ORDER — INSULIN ASPART 100 UNIT/ML IJ SOLN
6.0000 [IU] | Freq: Three times a day (TID) | INTRAMUSCULAR | Status: DC
  Administered 2022-01-21 (×2): 6 [IU] via SUBCUTANEOUS
  Filled 2022-01-20 (×2): qty 1

## 2022-01-20 MED ORDER — DEXMEDETOMIDINE HCL IN NACL 80 MCG/20ML IV SOLN
INTRAVENOUS | Status: DC | PRN
  Administered 2022-01-20: 16 ug via INTRAVENOUS

## 2022-01-20 MED ORDER — LIDOCAINE HCL (CARDIAC) PF 100 MG/5ML IV SOSY
PREFILLED_SYRINGE | INTRAVENOUS | Status: DC | PRN
  Administered 2022-01-20: 80 mg via INTRAVENOUS

## 2022-01-20 MED ORDER — MONTELUKAST SODIUM 10 MG PO TABS
10.0000 mg | ORAL_TABLET | Freq: Every day | ORAL | Status: DC
  Administered 2022-01-21 – 2022-01-25 (×5): 10 mg via ORAL
  Filled 2022-01-20 (×6): qty 1

## 2022-01-20 MED ORDER — ROCURONIUM BROMIDE 10 MG/ML (PF) SYRINGE
PREFILLED_SYRINGE | INTRAVENOUS | Status: AC
  Filled 2022-01-20: qty 10

## 2022-01-20 MED ORDER — ALBUTEROL SULFATE HFA 108 (90 BASE) MCG/ACT IN AERS
1.0000 | INHALATION_SPRAY | Freq: Four times a day (QID) | RESPIRATORY_TRACT | Status: DC | PRN
  Administered 2022-01-21 (×3): 2 via RESPIRATORY_TRACT
  Filled 2022-01-20: qty 6.7

## 2022-01-20 MED ORDER — FLUTICASONE PROPIONATE 50 MCG/ACT NA SUSP
1.0000 | Freq: Every day | NASAL | Status: DC
  Administered 2022-01-22 – 2022-01-25 (×4): 1 via NASAL
  Filled 2022-01-20: qty 16

## 2022-01-20 MED ORDER — PHENYLEPHRINE HCL (PRESSORS) 10 MG/ML IV SOLN
INTRAVENOUS | Status: DC | PRN
  Administered 2022-01-20 (×5): 160 ug via INTRAVENOUS
  Administered 2022-01-20: 320 ug via INTRAVENOUS
  Administered 2022-01-20: 160 ug via INTRAVENOUS

## 2022-01-20 MED ORDER — DEXAMETHASONE SODIUM PHOSPHATE 10 MG/ML IJ SOLN
INTRAMUSCULAR | Status: AC
  Filled 2022-01-20: qty 1

## 2022-01-20 MED ORDER — EPHEDRINE 5 MG/ML INJ
INTRAVENOUS | Status: AC
  Filled 2022-01-20: qty 15

## 2022-01-20 MED ORDER — FENTANYL CITRATE (PF) 100 MCG/2ML IJ SOLN
INTRAMUSCULAR | Status: AC
  Filled 2022-01-20: qty 2

## 2022-01-20 MED ORDER — ONDANSETRON 4 MG PO TBDP: 4.0000 mg | ORAL_TABLET | Freq: Four times a day (QID) | ORAL | Status: DC | PRN

## 2022-01-20 MED ORDER — LIDOCAINE HCL (PF) 2 % IJ SOLN
INTRAMUSCULAR | Status: AC
  Filled 2022-01-20: qty 5

## 2022-01-20 MED ORDER — INSULIN ASPART 100 UNIT/ML IJ SOLN
INTRAMUSCULAR | Status: AC
  Administered 2022-01-20: 1 [IU]
  Filled 2022-01-20: qty 1

## 2022-01-20 MED ORDER — METHOCARBAMOL 500 MG PO TABS
500.0000 mg | ORAL_TABLET | Freq: Three times a day (TID) | ORAL | Status: DC | PRN
  Administered 2022-01-20 – 2022-01-22 (×3): 500 mg via ORAL
  Filled 2022-01-20 (×2): qty 1

## 2022-01-20 MED ORDER — PHENYLEPHRINE 80 MCG/ML (10ML) SYRINGE FOR IV PUSH (FOR BLOOD PRESSURE SUPPORT)
PREFILLED_SYRINGE | INTRAVENOUS | Status: AC
  Filled 2022-01-20: qty 40

## 2022-01-20 MED ORDER — 0.9 % SODIUM CHLORIDE (POUR BTL) OPTIME
TOPICAL | Status: DC | PRN
  Administered 2022-01-20: 1000 mL

## 2022-01-20 MED ORDER — FENTANYL CITRATE (PF) 100 MCG/2ML IJ SOLN
INTRAMUSCULAR | Status: AC
  Administered 2022-01-20: 50 ug via INTRAVENOUS
  Filled 2022-01-20: qty 2

## 2022-01-20 MED ORDER — LISINOPRIL 20 MG PO TABS
20.0000 mg | ORAL_TABLET | Freq: Every day | ORAL | Status: DC
  Filled 2022-01-20: qty 1

## 2022-01-20 MED ORDER — HYDROMORPHONE HCL 1 MG/ML IJ SOLN
0.5000 mg | INTRAMUSCULAR | Status: AC | PRN
  Administered 2022-01-20 (×2): 0.5 mg via INTRAVENOUS

## 2022-01-20 MED ORDER — LACTATED RINGERS IV SOLN: INTRAVENOUS | Status: DC | PRN

## 2022-01-20 MED ORDER — SODIUM CHLORIDE (PF) 0.9 % IJ SOLN
INTRAMUSCULAR | Status: AC
  Filled 2022-01-20: qty 50

## 2022-01-20 MED ORDER — MELATONIN 5 MG PO TABS
5.0000 mg | ORAL_TABLET | Freq: Every evening | ORAL | Status: DC | PRN
  Administered 2022-01-21 – 2022-01-23 (×2): 5 mg via ORAL
  Filled 2022-01-20 (×2): qty 1

## 2022-01-20 MED ORDER — PHENYLEPHRINE HCL-NACL 20-0.9 MG/250ML-% IV SOLN
INTRAVENOUS | Status: AC
  Filled 2022-01-20: qty 250

## 2022-01-20 MED ORDER — ONDANSETRON HCL 4 MG/2ML IJ SOLN
INTRAMUSCULAR | Status: DC | PRN
  Administered 2022-01-20: 4 mg via INTRAVENOUS

## 2022-01-20 MED ORDER — PROPOFOL 10 MG/ML IV BOLUS
INTRAVENOUS | Status: DC | PRN
  Administered 2022-01-20: 150 mg via INTRAVENOUS

## 2022-01-20 MED ORDER — PROCHLORPERAZINE EDISYLATE 10 MG/2ML IJ SOLN: 5.0000 mg | Freq: Four times a day (QID) | INTRAMUSCULAR | Status: DC | PRN

## 2022-01-20 MED ORDER — OXYCODONE HCL 5 MG PO TABS
5.0000 mg | ORAL_TABLET | ORAL | Status: DC | PRN
  Administered 2022-01-20 – 2022-01-25 (×15): 10 mg via ORAL
  Filled 2022-01-20 (×15): qty 2

## 2022-01-20 MED ORDER — MIDAZOLAM HCL 2 MG/2ML IJ SOLN
INTRAMUSCULAR | Status: AC
  Filled 2022-01-20: qty 2

## 2022-01-20 MED ORDER — MIDAZOLAM HCL 2 MG/2ML IJ SOLN
INTRAMUSCULAR | Status: DC | PRN
  Administered 2022-01-20: 2 mg via INTRAVENOUS

## 2022-01-20 MED ORDER — ALBUMIN HUMAN 5 % IV SOLN
INTRAVENOUS | Status: AC
  Filled 2022-01-20: qty 500

## 2022-01-20 MED ORDER — ROCURONIUM BROMIDE 100 MG/10ML IV SOLN
INTRAVENOUS | Status: DC | PRN
  Administered 2022-01-20: 40 mg via INTRAVENOUS
  Administered 2022-01-20: 50 mg via INTRAVENOUS
  Administered 2022-01-20: 40 mg via INTRAVENOUS
  Administered 2022-01-20: 50 mg via INTRAVENOUS
  Administered 2022-01-20: 30 mg via INTRAVENOUS
  Administered 2022-01-20: 40 mg via INTRAVENOUS

## 2022-01-20 MED ORDER — NALOXONE HCL 4 MG/0.1ML NA LIQD: 1.0000 | NASAL | Status: DC | PRN

## 2022-01-20 MED ORDER — DIPHENHYDRAMINE HCL 50 MG/ML IJ SOLN: 12.5000 mg | Freq: Four times a day (QID) | INTRAMUSCULAR | Status: DC | PRN

## 2022-01-20 MED ORDER — ENOXAPARIN SODIUM 40 MG/0.4ML IJ SOSY
40.0000 mg | PREFILLED_SYRINGE | INTRAMUSCULAR | Status: DC
  Administered 2022-01-21 – 2022-01-24 (×4): 40 mg via SUBCUTANEOUS
  Filled 2022-01-20 (×4): qty 0.4

## 2022-01-20 MED ORDER — ACETAMINOPHEN 500 MG PO TABS
ORAL_TABLET | ORAL | Status: AC
  Administered 2022-01-20: 1000 mg via ORAL
  Filled 2022-01-20: qty 2

## 2022-01-20 MED ORDER — KETOROLAC TROMETHAMINE 30 MG/ML IJ SOLN
INTRAMUSCULAR | Status: AC
  Filled 2022-01-20: qty 2

## 2022-01-20 SURGICAL SUPPLY — 84 items
APPLICATOR VISTASEAL 35 (MISCELLANEOUS) IMPLANT
APPLIER CLIP 13 LRG OPEN (CLIP) ×2
BARRIER ADH SEPRAFILM 3INX5IN (MISCELLANEOUS) IMPLANT
BLADE SURG 15 STRL LF DISP TIS (BLADE) ×2 IMPLANT
BLADE SURG 15 STRL SS (BLADE) ×2
BULB RESERV EVAC DRAIN JP 100C (MISCELLANEOUS) IMPLANT
CANNULA REDUC XI 12-8 STAPL (CANNULA) ×2
CANNULA REDUCER 12-8 DVNC XI (CANNULA) ×2 IMPLANT
CLIP APPLIE 13 LRG OPEN (CLIP) IMPLANT
COVER LIGHT HANDLE STERIS (MISCELLANEOUS) IMPLANT
DERMABOND ADVANCED .7 DNX12 (GAUZE/BANDAGES/DRESSINGS) ×2 IMPLANT
DRAIN CHANNEL JP 19F (MISCELLANEOUS) IMPLANT
DRAIN PENROSE 0.625X18 (DRAIN) IMPLANT
DRAPE 3/4 80X56 (DRAPES) ×2 IMPLANT
DRAPE ARM DVNC X/XI (DISPOSABLE) ×8 IMPLANT
DRAPE COLUMN DVNC XI (DISPOSABLE) ×2 IMPLANT
DRAPE DA VINCI XI ARM (DISPOSABLE) ×8
DRAPE DA VINCI XI COLUMN (DISPOSABLE) ×2
DRSG OPSITE POSTOP 3X4 (GAUZE/BANDAGES/DRESSINGS) IMPLANT
DRSG OPSITE POSTOP 4X10 (GAUZE/BANDAGES/DRESSINGS) IMPLANT
ELECT BLADE 6.5 EXT (BLADE) IMPLANT
ELECT CAUTERY BLADE 6.4 (BLADE) ×2 IMPLANT
ELECT REM PT RETURN 9FT ADLT (ELECTROSURGICAL) ×2
ELECTRODE REM PT RTRN 9FT ADLT (ELECTROSURGICAL) ×2 IMPLANT
GLOVE BIO SURGEON STRL SZ7 (GLOVE) ×6 IMPLANT
GOWN STRL REUS W/ TWL LRG LVL3 (GOWN DISPOSABLE) ×8 IMPLANT
GOWN STRL REUS W/TWL LRG LVL3 (GOWN DISPOSABLE) ×8
GRASPER LAPSCPC 5X45 DSP (INSTRUMENTS) ×2 IMPLANT
HEMOSTAT SURGICEL 2X14 (HEMOSTASIS) IMPLANT
IRRIGATION STRYKERFLOW (MISCELLANEOUS) IMPLANT
IRRIGATOR STRYKERFLOW (MISCELLANEOUS) ×2
IV NS 1000ML (IV SOLUTION) ×2
IV NS 1000ML BAXH (IV SOLUTION) IMPLANT
KIT PINK PAD W/HEAD ARE REST (MISCELLANEOUS) ×2
KIT PINK PAD W/HEAD ARM REST (MISCELLANEOUS) ×2 IMPLANT
KIT TURNOVER CYSTO (KITS) ×2 IMPLANT
LABEL OR SOLS (LABEL) ×2 IMPLANT
LIGASURE IMPACT 36 18CM CVD LR (INSTRUMENTS) IMPLANT
MANIFOLD NEPTUNE II (INSTRUMENTS) ×2 IMPLANT
MESH BIO-A 7X10 SYN MAT (Mesh General) IMPLANT
NEEDLE HYPO 22GX1.5 SAFETY (NEEDLE) ×2 IMPLANT
OBTURATOR OPTICAL STANDARD 8MM (TROCAR) ×2
OBTURATOR OPTICAL STND 8 DVNC (TROCAR) ×2
OBTURATOR OPTICALSTD 8 DVNC (TROCAR) ×2 IMPLANT
PACK LAP CHOLECYSTECTOMY (MISCELLANEOUS) ×2 IMPLANT
PENCIL SMOKE EVACUATOR (MISCELLANEOUS) ×2 IMPLANT
SEAL CANN UNIV 5-8 DVNC XI (MISCELLANEOUS) ×6 IMPLANT
SEAL XI 5MM-8MM UNIVERSAL (MISCELLANEOUS) ×6
SEALER VESSEL DA VINCI XI (MISCELLANEOUS) ×2
SEALER VESSEL EXT DVNC XI (MISCELLANEOUS) ×2 IMPLANT
SOLUTION ELECTROLUBE (MISCELLANEOUS) ×2 IMPLANT
SPIKE FLUID TRANSFER (MISCELLANEOUS) ×2 IMPLANT
SPONGE T-LAP 18X18 ~~LOC~~+RFID (SPONGE) ×2 IMPLANT
SPONGE T-LAP 18X36 ~~LOC~~+RFID STR (SPONGE) IMPLANT
STAPLER CANNULA SEAL DVNC XI (STAPLE) ×2 IMPLANT
STAPLER CANNULA SEAL XI (STAPLE) ×2
STAPLER SKIN PROX 35W (STAPLE) IMPLANT
SUT ETHIBOND 0 MO6 C/R (SUTURE) IMPLANT
SUT ETHILON 3-0 FS-10 30 BLK (SUTURE) ×2
SUT MNCRL 4-0 (SUTURE) ×2
SUT MNCRL 4-0 27XMFL (SUTURE) ×2
SUT PDS AB 0 CT1 27 (SUTURE) IMPLANT
SUT SILK 2 0 (SUTURE) ×2
SUT SILK 2 0 SH (SUTURE) ×6 IMPLANT
SUT SILK 2 0SH CR/8 30 (SUTURE) IMPLANT
SUT SILK 2-0 18XBRD TIE 12 (SUTURE) IMPLANT
SUT STRATAFIX PDS  2-0 CT-2 (SUTURE) ×2
SUT STRATAFIX PDS 2-0 CT-2 (SUTURE) IMPLANT
SUT VIC AB 3-0 SH 27 (SUTURE)
SUT VIC AB 3-0 SH 27X BRD (SUTURE) IMPLANT
SUT VICRYL 0 AB UR-6 (SUTURE) ×4 IMPLANT
SUT VLOC 90 S/L VL9 GS22 (SUTURE) ×2 IMPLANT
SUTURE EHLN 3-0 FS-10 30 BLK (SUTURE) IMPLANT
SUTURE MNCRL 4-0 27XMF (SUTURE) ×2 IMPLANT
SYR 20ML LL LF (SYRINGE) ×2 IMPLANT
SYR 30ML LL (SYRINGE) ×2 IMPLANT
SYS BAG RETRIEVAL 10MM (BASKET) ×2
SYSTEM BAG RETRIEVAL 10MM (BASKET) IMPLANT
TRAP FLUID SMOKE EVACUATOR (MISCELLANEOUS) ×2 IMPLANT
TRAY FOLEY SLVR 16FR LF STAT (SET/KITS/TRAYS/PACK) ×2 IMPLANT
TROCAR XCEL NON-BLD 5MMX100MML (ENDOMECHANICALS) ×2 IMPLANT
TUBING CONNECTING 10 (TUBING) IMPLANT
TUBING EVAC SMOKE HEATED PNEUM (TUBING) ×2 IMPLANT
WATER STERILE IRR 500ML POUR (IV SOLUTION) ×2 IMPLANT

## 2022-01-20 NOTE — Anesthesia Postprocedure Evaluation (Signed)
Anesthesia Post Note  Patient: Darren Allen  Procedure(s) Performed: XI ROBOTIC ASSISTED PARAESOPHAGEAL HERNIA REPAIR, CONVERTED TO OPEN, RNFA to assist (Abdomen) INSERTION OF MESH HERNIA REPAIR UMBILICAL ADULT  Patient location during evaluation: PACU Anesthesia Type: General Level of consciousness: awake and alert Pain management: pain level controlled Vital Signs Assessment: post-procedure vital signs reviewed and stable Respiratory status: spontaneous breathing, nonlabored ventilation, respiratory function stable and patient connected to nasal cannula oxygen Cardiovascular status: blood pressure returned to baseline and stable Postop Assessment: no apparent nausea or vomiting Anesthetic complications: no   No notable events documented.   Last Vitals:  Vitals:   01/20/22 1603 01/20/22 1615  BP: (!) 122/57 (!) 101/52  Pulse: 100 100  Resp: 16 16  Temp: 36.7 C   SpO2: 93% 93%    Last Pain:  Vitals:   01/20/22 1615  TempSrc:   PainSc: 6                  Ilene Qua

## 2022-01-20 NOTE — Op Note (Signed)
Attempted Robotic assisted laparoscopic Repair Open repair of  paraesophageal  hernia with Bio-A Mesh and Nissen fundoplication Open Umbilical hernia repair   Pre-operative Diagnosis: Paraesophageal hernia type III  Post-operative Diagnosis: same  Procedure:  Attempted Robotic assisted laparoscopic Repair Open repair of  paraesophageal  hernia with Bio-A Mesh and Nissen fundoplication Open Umbilical hernia repair   Surgeon: Caroleen Hamman, MD FACS  Assistant: Olean Ree MD. Required due to the complexity of the case the need for exposure and lack of first assist.  Anesthesia: Gen. with endotracheal tube  Findings: Type III paraesophageal hernia w 1/2 of the stomach within mediastinum Large liver  Large spleen with difficult exposure Loose wrap 360 degree over 50 FR Bougie Unable to control left gastric branch robotically needed conversion to open 1.5 cm umbilical hernia  Estimated Blood Loss: 600cc       Specimens: sac           Complications: none  Procedure Details  The patient was seen again in the Holding Room. The benefits, complications, treatment options, and expected outcomes were discussed with the patient. The risks of bleeding, infection, recurrence of symptoms, failure to resolve symptoms,  esophageal damage, Dysphagia, bowel injury, any of which could require further surgery were reviewed with the patient. The likelihood of improving the patient's symptoms with return to their baseline status is good.  The patient and/or family concurred with the proposed plan, giving informed consent.  The patient was taken to Operating Room, identified  and the procedure verified.  A Time Out was held and the above information confirmed.  Prior to the induction of general anesthesia, antibiotic prophylaxis was administered. VTE prophylaxis was in place. General endotracheal anesthesia was then administered and tolerated well. After the induction, the abdomen was prepped with  Chloraprep and draped in the sterile fashion. The patient was positioned in the supine position.  Cut down technique was used to enter the abdominal cavity , we identified the umbilical hernia and the sac was removed. The abdominal cavity entered under direct visualization. a Hasson trochar was placed after two vicryl stitches were anchored to the fascia. Pneumoperitoneum was then created with CO2 and tolerated well without any adverse changes in the patient's vital signs.  Three 8-mm ports were placed under direct vision. All skin incisions  were infiltrated with a local anesthetic agent before making the incision and placing the trocars. An additional 5 mm regular laparoscopic port was placed to assist with retraction and exposure.   The patient was positioned  in reverse Trendelenburg, robot was brought to the surgical field and docked in the standard fashion.  We made sure all the instrumentation was kept indirect view at all times and that there were no collision between the arms. I scrubbed out and went to the console.  I used a robotic arm to retract the liver, the vessel sealer on my right hand and a forced bipolar grasper on my left hand.  Epix grasper placed using 5 mm port to allow me ample exposure and the ability to perform meticulous dissection  We Started dividing the lesser omentum via the pars flaccida.  We Were able to dissect the lesser curvature of the stomach and  dissected the fundus free from the right and left crus.  We circumferentially dissected the GE junction.  The hernia sac was also completely reduced and we were able to bring the stomach into the intra-abdominal position.  Attention then was turned to the greater curvature where the short  gastrics were divided with sealer device.  We were able to identify the left crus and again were able to make sure there was a good circumferential dissection and that the hernia sac was completely excised.  We did perform a good dissection  within the mediastinum to allow a complete reduction of the sac, gain esophageal length and a to completely allow an intra-abdominal fundoplication.  I needed to mobilize the lesser sac more to allow a good fundoplication while doing this the vessel sealer was used and was unable to control the bleeding from a left gastric branch vessel. I attempted to control the bleeding robotically but was very difficult. I aborted the robotic approach and continue with a regular laparotomy. The robot was undocked and all instruments were removed. Midline laparotomy performed  in the standard fashion and the abdomen was packed and bleeding was controlled. I asked my partner to scrub in due to difficulty in exposure secondary to large liver and spleen. We visualized the bleeding vessel and suture ligated after controlling the bleeding with kelly clamps. The vessel was suture ligated using 2-0 silks. Good hemostasis was achieved.   Using two strips of Bio-A as pledgets we approximated the crus with a 2-0 stratafix suture. A bio-A 10x7 cm mesh was inserted and secured using Vistaseal.   We Asked anesthesia to place a 50 French bougie and this went easily.  We also observe trajectory of the bougie. 360 degree Nissen fundoplication was created with multiple 2-0 silk sutures and we placed 3 stitches taking some of the esophagus within that bite.  The fundoplication measured approximately 3-1/2 cm and he was floppy. I was very happy with the way the fundoplication laid and the repair of the hernia.  Inspection of the  upper quadrant was performed. No bleeding, bile  Or esophageal injuries leaks, or bowel injuries were noted.  Liposomal marcaine was injected performing TAP block bilaterally.  19 Fr drained placed next to the fundoplication and secured w 3-0 nylon.   Seprafilm used and fascia closed with 0 PDS using small bite technique and incorporating umbilical hernia.  Skin closed with staples.  The patient was  then extubated and brought to the recovery room in stable condition. Sponge, lap, and needle counts were correct at closure and at the conclusion of the case.               Caroleen Hamman, MD, FACS

## 2022-01-20 NOTE — Anesthesia Procedure Notes (Signed)
Procedure Name: Intubation Date/Time: 01/20/2022 11:49 AM  Performed by: Beverely Low, CRNAPre-anesthesia Checklist: Patient identified, Patient being monitored, Timeout performed, Emergency Drugs available and Suction available Patient Re-evaluated:Patient Re-evaluated prior to induction Oxygen Delivery Method: Circle system utilized Preoxygenation: Pre-oxygenation with 100% oxygen Induction Type: IV induction Ventilation: Mask ventilation without difficulty Laryngoscope Size: McGraph and 4 Grade View: Grade I Tube type: Oral Tube size: 7.5 mm Number of attempts: 1 Airway Equipment and Method: Stylet Placement Confirmation: ETT inserted through vocal cords under direct vision, positive ETCO2 and breath sounds checked- equal and bilateral Secured at: 23 cm Tube secured with: Tape Dental Injury: Teeth and Oropharynx as per pre-operative assessment  Comments: Inserted by Laurence Spates

## 2022-01-20 NOTE — Anesthesia Preprocedure Evaluation (Signed)
Anesthesia Evaluation  Patient identified by MRN, date of birth, ID band Patient awake    Reviewed: Allergy & Precautions, NPO status , Patient's Chart, lab work & pertinent test results, reviewed documented beta blocker date and time   History of Anesthesia Complications Negative for: history of anesthetic complications  Airway Mallampati: II  TM Distance: >3 FB     Dental  (+) Chipped, Poor Dentition, Dental Advisory Given, Upper Dentures   Pulmonary shortness of breath and with exertion, sleep apnea , COPD,  COPD inhaler, neg recent URI, Current Smoker          Cardiovascular Exercise Tolerance: Good hypertension, Pt. on medications (-) angina + Peripheral Vascular Disease  (-) CAD, (-) Past MI, (-) Cardiac Stents and (-) CABG (-) dysrhythmias (-) Valvular Problems/Murmurs     Neuro/Psych negative neurological ROS  negative psych ROS   GI/Hepatic Neg liver ROS, hiatal hernia,GERD  Medicated and Controlled,,  Endo/Other  diabetes    Renal/GU Renal InsufficiencyRenal disease  negative genitourinary   Musculoskeletal   Abdominal   Peds  Hematology negative hematology ROS (+)   Anesthesia Other Findings Past Medical History: 02/04/2015: Calculus of kidney No date: COPD (chronic obstructive pulmonary disease) (HCC) No date: Diabetes mellitus without complication (HCC)     Comment:  type 2 No date: Hypertension   Reproductive/Obstetrics negative OB ROS                             Anesthesia Physical Anesthesia Plan  ASA: 3  Anesthesia Plan: General   Post-op Pain Management:  Regional for Post-op pain   Induction: Intravenous  PONV Risk Score and Plan: 1 and Ondansetron and Dexamethasone  Airway Management Planned: Oral ETT  Additional Equipment:   Intra-op Plan:   Post-operative Plan: Extubation in OR  Informed Consent: I have reviewed the patients History and Physical,  chart, labs and discussed the procedure including the risks, benefits and alternatives for the proposed anesthesia with the patient or authorized representative who has indicated his/her understanding and acceptance.       Plan Discussed with: CRNA  Anesthesia Plan Comments:         Anesthesia Quick Evaluation

## 2022-01-20 NOTE — Transfer of Care (Signed)
Immediate Anesthesia Transfer of Care Note  Patient: Darren Allen  Procedure(s) Performed: XI ROBOTIC ASSISTED PARAESOPHAGEAL HERNIA REPAIR, RNFA to assist [place] UMBILICAL ADULT  Patient Location: PACU  Anesthesia Type:General  Level of Consciousness: awake and alert   Airway & Oxygen Therapy: Patient Spontanous Breathing  Post-op Assessment: Report given to RN and Post -op Vital signs reviewed and stable  Post vital signs: Reviewed and stable  Last Vitals:  Vitals Value Taken Time  BP 125 74 1608  Temp 98 1608  Pulse 98 1608  Resp 12 1608  SpO2 98 1608    Last Pain:  Vitals:   01/20/22 1008  TempSrc: Temporal  PainSc: 5          Complications: No notable events documented.

## 2022-01-20 NOTE — Progress Notes (Signed)
Dr. Danne Baxter: anesthesia aware of last blood sugar 237, OK to transfer to floor.  Patient awake/alert x4. Tolerated water without event. Medicated as ordered, will continue to monitor.

## 2022-01-20 NOTE — Interval H&P Note (Signed)
History and Physical Interval Note:  01/20/2022 10:24 AM  Darren Allen  has presented today for surgery, with the diagnosis of Hiatal hernia.  The various methods of treatment have been discussed with the patient and family. After consideration of risks, benefits and other options for treatment, the patient has consented to  Procedure(s): XI ROBOTIC ASSISTED PARAESOPHAGEAL HERNIA REPAIR, RNFA to assist (N/A) as a surgical intervention.  The patient's history has been reviewed, patient examined, no change in status, stable for surgery.  I have reviewed the patient's chart and labs.  Questions were answered to the patient's satisfaction.     [place]

## 2022-01-21 ENCOUNTER — Inpatient Hospital Stay: Payer: HMO

## 2022-01-21 ENCOUNTER — Encounter: Payer: Self-pay | Admitting: Surgery

## 2022-01-21 DIAGNOSIS — E875 Hyperkalemia: Secondary | ICD-10-CM

## 2022-01-21 DIAGNOSIS — J449 Chronic obstructive pulmonary disease, unspecified: Secondary | ICD-10-CM

## 2022-01-21 DIAGNOSIS — N179 Acute kidney failure, unspecified: Secondary | ICD-10-CM

## 2022-01-21 DIAGNOSIS — Z8719 Personal history of other diseases of the digestive system: Secondary | ICD-10-CM

## 2022-01-21 DIAGNOSIS — I10 Essential (primary) hypertension: Secondary | ICD-10-CM | POA: Diagnosis not present

## 2022-01-21 DIAGNOSIS — K429 Umbilical hernia without obstruction or gangrene: Secondary | ICD-10-CM | POA: Diagnosis not present

## 2022-01-21 DIAGNOSIS — E1142 Type 2 diabetes mellitus with diabetic polyneuropathy: Secondary | ICD-10-CM

## 2022-01-21 DIAGNOSIS — Z9889 Other specified postprocedural states: Secondary | ICD-10-CM

## 2022-01-21 DIAGNOSIS — E1140 Type 2 diabetes mellitus with diabetic neuropathy, unspecified: Secondary | ICD-10-CM

## 2022-01-21 DIAGNOSIS — K449 Diaphragmatic hernia without obstruction or gangrene: Secondary | ICD-10-CM | POA: Diagnosis not present

## 2022-01-21 DIAGNOSIS — D62 Acute posthemorrhagic anemia: Secondary | ICD-10-CM

## 2022-01-21 HISTORY — DX: Acute kidney failure, unspecified: N17.9

## 2022-01-21 LAB — GLUCOSE, CAPILLARY
Glucose-Capillary: 186 mg/dL — ABNORMAL HIGH (ref 70–99)
Glucose-Capillary: 246 mg/dL — ABNORMAL HIGH (ref 70–99)
Glucose-Capillary: 105 mg/dL — ABNORMAL HIGH (ref 70–99)
Glucose-Capillary: 129 mg/dL — ABNORMAL HIGH (ref 70–99)

## 2022-01-21 LAB — URINALYSIS, COMPLETE (UACMP) WITH MICROSCOPIC
Bilirubin Urine: NEGATIVE
Glucose, UA: NEGATIVE mg/dL
Ketones, ur: NEGATIVE mg/dL
Nitrite: NEGATIVE
Protein, ur: 30 mg/dL — AB
Specific Gravity, Urine: 1.01 (ref 1.005–1.030)
pH: 5 (ref 5.0–8.0)

## 2022-01-21 LAB — CBC
HCT: 34.3 % — ABNORMAL LOW (ref 39.0–52.0)
Hemoglobin: 10.9 g/dL — ABNORMAL LOW (ref 13.0–17.0)
MCH: 27.7 pg (ref 26.0–34.0)
MCHC: 31.8 g/dL (ref 30.0–36.0)
MCV: 87.1 fL (ref 80.0–100.0)
Platelets: 213 10*3/uL (ref 150–400)
RBC: 3.94 MIL/uL — ABNORMAL LOW (ref 4.22–5.81)
RDW: 14.1 % (ref 11.5–15.5)
WBC: 11.5 10*3/uL — ABNORMAL HIGH (ref 4.0–10.5)
nRBC: 0 % (ref 0.0–0.2)

## 2022-01-21 LAB — BASIC METABOLIC PANEL
Anion gap: 10 (ref 5–15)
Anion gap: 6 (ref 5–15)
BUN: 33 mg/dL — ABNORMAL HIGH (ref 6–20)
BUN: 32 mg/dL — ABNORMAL HIGH (ref 6–20)
CO2: 18 mmol/L — ABNORMAL LOW (ref 22–32)
CO2: 22 mmol/L (ref 22–32)
Calcium: 7.8 mg/dL — ABNORMAL LOW (ref 8.9–10.3)
Calcium: 7.5 mg/dL — ABNORMAL LOW (ref 8.9–10.3)
Chloride: 109 mmol/L (ref 98–111)
Chloride: 108 mmol/L (ref 98–111)
Creatinine, Ser: 2.54 mg/dL — ABNORMAL HIGH (ref 0.61–1.24)
Creatinine, Ser: 1.91 mg/dL — ABNORMAL HIGH (ref 0.61–1.24)
GFR, Estimated: 29 mL/min — ABNORMAL LOW (ref >60)
GFR, Estimated: 40 mL/min — ABNORMAL LOW (ref >60)
Glucose, Bld: 195 mg/dL — ABNORMAL HIGH (ref 70–99)
Glucose, Bld: 223 mg/dL — ABNORMAL HIGH (ref 70–99)
Potassium: 5.4 mmol/L — ABNORMAL HIGH (ref 3.5–5.1)
Potassium: 4.3 mmol/L (ref 3.5–5.1)
Sodium: 137 mmol/L (ref 135–145)
Sodium: 136 mmol/L (ref 135–145)

## 2022-01-21 LAB — MAGNESIUM
Magnesium: 1.6 mg/dL — ABNORMAL LOW (ref 1.7–2.4)
Magnesium: 1.6 mg/dL — ABNORMAL LOW (ref 1.7–2.4)

## 2022-01-21 MED ORDER — IPRATROPIUM-ALBUTEROL 0.5-2.5 (3) MG/3ML IN SOLN
3.0000 mL | Freq: Once | RESPIRATORY_TRACT | Status: AC
  Administered 2022-01-21: 3 mL via RESPIRATORY_TRACT
  Filled 2022-01-21: qty 3

## 2022-01-21 MED ORDER — MAGNESIUM SULFATE IN D5W 1-5 GM/100ML-% IV SOLN
1.0000 g | Freq: Once | INTRAVENOUS | Status: AC
  Administered 2022-01-21: 1 g via INTRAVENOUS
  Filled 2022-01-21 (×2): qty 100

## 2022-01-21 MED ORDER — IPRATROPIUM-ALBUTEROL 0.5-2.5 (3) MG/3ML IN SOLN
RESPIRATORY_TRACT | Status: AC
  Administered 2022-01-21: 3 mL via RESPIRATORY_TRACT
  Filled 2022-01-21: qty 3

## 2022-01-21 MED ORDER — LACTATED RINGERS IV SOLN: INTRAVENOUS | Status: DC

## 2022-01-21 MED ORDER — ALBUMIN HUMAN 25 % IV SOLN
25.0000 g | Freq: Once | INTRAVENOUS | Status: AC
  Administered 2022-01-21: 25 g via INTRAVENOUS
  Filled 2022-01-21: qty 100

## 2022-01-21 MED ORDER — MAGNESIUM SULFATE IN D5W 1-5 GM/100ML-% IV SOLN
1.0000 g | Freq: Once | INTRAVENOUS | Status: AC
  Administered 2022-01-21: 1 g via INTRAVENOUS
  Filled 2022-01-21: qty 100

## 2022-01-21 MED ORDER — GABAPENTIN 100 MG PO CAPS
200.0000 mg | ORAL_CAPSULE | Freq: Three times a day (TID) | ORAL | Status: DC
  Administered 2022-01-21 – 2022-01-22 (×3): 200 mg via ORAL
  Filled 2022-01-21 (×3): qty 2

## 2022-01-21 MED ORDER — SODIUM ZIRCONIUM CYCLOSILICATE 10 G PO PACK
10.0000 g | PACK | Freq: Once | ORAL | Status: AC
  Administered 2022-01-21: 10 g via ORAL
  Filled 2022-01-21: qty 1

## 2022-01-21 MED ORDER — LACTATED RINGERS IV SOLN: INTRAVENOUS | Status: AC

## 2022-01-21 MED ORDER — INSULIN ASPART 100 UNIT/ML IJ SOLN
10.0000 [IU] | Freq: Three times a day (TID) | INTRAMUSCULAR | Status: DC
  Administered 2022-01-21 – 2022-01-25 (×7): 10 [IU] via SUBCUTANEOUS
  Filled 2022-01-21 (×8): qty 1

## 2022-01-21 MED ORDER — IPRATROPIUM-ALBUTEROL 0.5-2.5 (3) MG/3ML IN SOLN
3.0000 mL | RESPIRATORY_TRACT | Status: DC | PRN
  Administered 2022-01-22 – 2022-01-23 (×2): 3 mL via RESPIRATORY_TRACT
  Filled 2022-01-21 (×2): qty 3

## 2022-01-21 MED ORDER — INSULIN GLARGINE-YFGN 100 UNIT/ML ~~LOC~~ SOLN
25.0000 [IU] | Freq: Every day | SUBCUTANEOUS | Status: DC
  Administered 2022-01-21 – 2022-01-24 (×4): 25 [IU] via SUBCUTANEOUS
  Filled 2022-01-21 (×5): qty 0.25

## 2022-01-21 MED ORDER — SODIUM CHLORIDE 0.9 % IV BOLUS
1000.0000 mL | Freq: Once | INTRAVENOUS | Status: AC
  Administered 2022-01-21: 1000 mL via INTRAVENOUS

## 2022-01-21 NOTE — Consult Note (Addendum)
Initial Consultation Note   Patient: Darren Allen IDP:[id number] DOB: January 06, 1966 PCP: Jon Billings, NP DOA: 01/20/2022 DOS: the patient was seen and examined on 01/21/2022 Primary service: Jules Husbands, MD  Referring provider: Edison Simon, PA Reason for consult: Diabetes management and AKI  Assessment and Plan: * S/P repair of paraesophageal hernia Postop day 1 of laparoscopic paraesophageal hernia repair converted to open secondary to bleeding from left gastric artery.  -Management per surgery  AKI (acute kidney injury) ([place]) Per chart review, patient has a history of CKD Stage 3a with baseline creatinine between 1.2 - 1.5. Most recently 1.2 in Oct 2023. On admission yesterday, creatinine elevated at 1.8 with further increase to 2.5 today. Etiology likely multifactorial in the setting of relatively low BP compared to patient's baseline, continued use of nephrotoxic agents and insensible losses.   - Urinalysis - Renal ultrasound - If renal ultrasound is without abnormalities, can consider removing Foley and starting a voiding challenge - Hold home nephrotoxic agents including lisinopril and hydrochlorothiazide - Decrease dose of home gabapentin given GFR less than 30 now.  Can increase once renal function improves. - 1 L NS bolus followed by continued maintenance IV fluids  Type 2 diabetes mellitus with diabetic neuropathy, without long-term current use of insulin ([place]) Patient follows with Dr. Gabriel Carina at [facility]. Current home regimen includes Trulicity 3.0 mg weekly, Metforrmin 1000 mg BID, and NPH 70/30 52 units in the morning and 56 units in the evening.   - Start Semglee 25 units nightly - Increase meal coverage to 10 units - Continue resistant scale SSI - Transition to home 70/30 once on regular diet - Continue holding home Trulicity and metformin  Hyperkalemia Likely due to AKI.  Not elevated enough to raise concern for cardiac complications.  -  One-time dose of Lokelma  Hypertension - Hold home antihypertensives given AKI and relatively low blood pressure this morning  Chronic obstructive pulmonary disease (HCC) Significant wheezing on examination, but patient denies any symptoms of increased shortness of breath, cough or sputum production.  No signs of exacerbation  - Continue home Trelegy - DuoNeb - Please bring albuterol inhaler to bedside  Acute blood loss anemia Per chart review, approximately 600 ccl of blood loss during surgery.  Hemoglobin decreased from 11.1-10.9.  I suspect this will continue to decline.  - CBC daily - Transfuse for hemoglobin less than 7 - Consider starting daily iron supplementation  Diabetic peripheral neuropathy (HCC) - Dose of home gabapentin decreased to 200 mg 3 times daily given reduced renal function at this time.   TRH will continue to follow the patient.  HPI: Darren Allen is a 56 y.o. male with past medical history of type 2 diabetes complicated by peripheral neuropathy, hypertension, COPD, CKD stage IIIa, chronic pain on Norco, who is currently admitted for paraesophageal and umbilical hernia repair.  Darren Allen states he is doing okay today and denies any complaints.  He has been able to get up and walk around the unit without any difficulty.  He endorses some pain around his surgical sites, but it is well controlled.  We discussed his diabetes regimen, he states he is currently taking 52 units in the morning and 56 units in the evening of 70-30.  He acknowledges a history of chronic kidney disease.  He notes that he has not noticed any changes in his urination other than that he occasionally dribbles after finishing urination now.  He recently followed up with his urologist and  did not recall any prostate abnormalities.  He denies any recent use of over-the-counter NSAIDs.  Darren Allen denies any current shortness of breath, chest pain.  He endorses some wheezing but feels it is due to  not having his inhaler available.  Review of Systems: As mentioned in the history of present illness. All other systems reviewed and are negative. Past Medical History:  Diagnosis Date   Allergy    Calculus of kidney 02/04/2015   COPD (chronic obstructive pulmonary disease) (HCC)    Diabetes mellitus without complication ([place])    type 2   Hypertension    Neuropathy    Past Surgical History:  Procedure Laterality Date   APPENDECTOMY     COLONOSCOPY WITH PROPOFOL N/A 12/31/2021   Procedure: COLONOSCOPY WITH PROPOFOL;  Surgeon: Jonathon Bellows, MD;  Location: [facility] ENDOSCOPY;  Service: Gastroenterology;  Laterality: N/A;   ESOPHAGOGASTRODUODENOSCOPY N/A 12/31/2021   Procedure: ESOPHAGOGASTRODUODENOSCOPY (EGD);  Surgeon: Jonathon Bellows, MD;  Location: [facility] ENDOSCOPY;  Service: Gastroenterology;  Laterality: N/A;   INCISION AND DRAINAGE PERIRECTAL ABSCESS N/A 02/04/2015   Procedure: IRRIGATION AND DEBRIDEMENT PERIRECTAL ABSCESS;  Surgeon: Marlyce Huge, MD;  Location: [facility] ORS;  Service: General;  Laterality: N/A;   INSERTION OF MESH  01/20/2022   Procedure: INSERTION OF MESH;  Surgeon: Jules Husbands, MD;  Location: [facility] ORS;  Service: General;;   KIDNEY STONE SURGERY Right    lung mass removal N/A    ORIF ANKLE FRACTURE Left 03/23/2017   Procedure: OPEN REDUCTION INTERNAL FIXATION (ORIF) ANKLE FRACTURE;  Surgeon: Leim Fabry, MD;  Location: [facility] ORS;  Service: Orthopedics;  Laterality: Left;   RECTAL EXAM UNDER ANESTHESIA  02/04/2015   Procedure: RECTAL EXAM UNDER ANESTHESIA;  Surgeon: Marlyce Huge, MD;  Location: [facility] ORS;  Service: General;;   SYNDESMOSIS REPAIR Left 03/23/2017   Procedure: SYNDESMOSIS REPAIR;  Surgeon: Leim Fabry, MD;  Location: [facility] ORS;  Service: Orthopedics;  Laterality: Left;   UMBILICAL HERNIA REPAIR  01/20/2022   Procedure: HERNIA REPAIR UMBILICAL ADULT;  Surgeon: Jules Husbands, MD;  Location: [facility] ORS;  Service: General;;   XI ROBOTIC ASSISTED  PARAESOPHAGEAL HERNIA REPAIR N/A 01/20/2022   Procedure: XI ROBOTIC ASSISTED PARAESOPHAGEAL HERNIA REPAIR, CONVERTED TO OPEN, RNFA to assist;  Surgeon: Jules Husbands, MD;  Location: [facility] ORS;  Service: General;  Laterality: N/A;   Social History:  reports that he has been smoking cigarettes. He has a 34.00 pack-year smoking history. He has never used smokeless tobacco. He reports that he does not currently use alcohol after a past usage of about 12.0 standard drinks of alcohol per week. He reports that he does not use drugs.  Allergies  Allergen Reactions   Glipizide Other (See Comments) and Palpitations    Shaky, feel bad Other reaction(s): Dizziness   Cephalexin Rash   Duloxetine Anxiety and Nausea Only    Family History  Problem Relation Age of Onset   Cancer Mother 59       Lung   Cancer Father        Colon   Heart disease Father    Alcohol abuse Father    Cancer Brother 80       Esophageal   Diabetes Brother    Heart disease Brother     Prior to Admission medications   Medication Sig Start Date End Date Taking? Authorizing Provider  amitriptyline (ELAVIL) 10 MG tablet Take 30 mg by mouth at bedtime.   Yes [provider]  ascorbic acid (VITAMIN C)  500 MG tablet Take by mouth daily.   Yes [provider]  ferrous sulfate 325 (65 FE) MG tablet Take 325 mg by mouth daily with breakfast.   Yes [provider]  fluticasone (FLONASE) 50 MCG/ACT nasal spray Place into both nostrils. 07/02/21  Yes [provider]  gabapentin (NEURONTIN) 600 MG tablet Take 600 mg by mouth 3 (three) times daily. 03/26/21  Yes [provider]  hydrochlorothiazide (HYDRODIURIL) 25 MG tablet Take 25 mg by mouth daily. 02/26/17  Yes [provider]  HYDROcodone-acetaminophen (NORCO/VICODIN) 5-325 MG tablet Take 1 tablet by mouth 2 (two) times daily as needed. Must last 30 days. 01/27/22 02/26/22 Yes Milinda Pointer, MD  insulin isophane & regular  human (HUMULIN 70/30 KWIKPEN) (70-30) 100 UNIT/ML KwikPen Inject 58 Units into the skin 2 (two) times daily with a meal. 58 with meals and 54 qhs 10/14/19  Yes [provider]  lisinopril (PRINIVIL,ZESTRIL) 40 MG tablet Take 40 mg by mouth daily. 02/26/17  Yes [provider]  metFORMIN (GLUCOPHAGE-XR) 500 MG 24 hr tablet TAKE 4 TABLETS(2000 MG) BY MOUTH DAILY 12/16/21  Yes Jon Billings, NP  montelukast (SINGULAIR) 10 MG tablet Take 10 mg by mouth daily. 07/02/21  Yes [provider]  pantoprazole (PROTONIX) 40 MG tablet Take 2 tablets (80 mg total) by mouth daily. 11/06/21  Yes Jon Billings, NP  rosuvastatin (CRESTOR) 40 MG tablet Take 40 mg by mouth daily. 09/30/20  Yes [provider]  tadalafil (CIALIS) 5 MG tablet Take 1 tablet (5 mg total) by mouth daily as needed for erectile dysfunction. 01/07/22  Yes McGowan, Shannon A, PA-C  TRELEGY ELLIPTA 200-62.5-25 MCG/INH AEPB Inhale 1 puff into the lungs daily. 10/10/20  Yes [provider]  triamcinolone cream (KENALOG) 0.1 % Apply 1 application  topically 2 (two) times daily. 08/25/21  Yes Jon Billings, NP  Accu-Chek Softclix Lancets lancets 3 (three) times daily. 09/30/20   [provider]  acetaminophen (TYLENOL) 500 MG tablet Take 2 tablets (1,000 mg total) by mouth every 8 (eight) hours. 03/24/17   Reche Dixon, PA-C  BD DISP NEEDLES 22G X 1-1/2" MISC every 14 (fourteen) days. 09/09/20   [provider]  BD PLASTIPAK SYRINGE 21G X 1" 3 ML MISC USE AS DIRECTED 12/12/21   McGowan, Larene Beach A, PA-C  ezetimibe (ZETIA) 10 MG tablet Take 10 mg by mouth daily. Patient not taking: Reported on 01/20/2022 11/04/21   [provider]  HYDROcodone-acetaminophen (NORCO/VICODIN) 5-325 MG tablet Take 1 tablet by mouth 2 (two) times daily as needed. Must last 30 days. 02/26/22 03/28/22  Milinda Pointer, MD  HYDROcodone-acetaminophen (NORCO/VICODIN) 5-325 MG tablet Take 1 tablet by mouth 2  (two) times daily as needed. Must last 30 days. 03/28/22 04/27/22  Milinda Pointer, MD  metoprolol tartrate (LOPRESSOR) 100 MG tablet Take 1 tablet (100 mg total) by mouth once for 1 dose. Please take one time dose '100mg'$  metoprolol tartrate 2 hr prior to cardiac CT for HR control IF HR >55bpm. 06/26/21 11/26/21  Kate Sable, MD  naloxone [facility]) nasal spray 4 mg/0.1 mL Place 1 spray into the nose as needed for up to 365 doses (for opioid-induced respiratory depresssion). In case of emergency (overdose), spray once into each nostril. If no response within 3 minutes, repeat application and call 161. 01/13/22 01/13/23  Milinda Pointer, MD  NEEDLE, DISP, 18 G (B-D BLUNT FILL NEEDLE) 18G X 1-1/2" MISC Use 18G needle to draw testosterone for injection 10/15/20   Billey Co,  MD  PROAIR HFA 108 (90 Base) MCG/ACT inhaler Inhale 2 puffs into the lungs every 6 (six) hours as needed. Patient not taking: Reported on 01/20/2022 12/28/20   [provider]  testosterone cypionate (DEPOTESTOSTERONE CYPIONATE) 200 MG/ML injection Inject 0.5 mLs (100 mg total) into the muscle every 14 (fourteen) days. 11/10/21   McGowan, Larene Beach A, PA-C  TRULICITY 3 ST/4.1DQ SOPN Inject 3 mg into the skin once a week. 10/28/21   [provider]    Physical Exam: Vitals:   01/20/22 1805 01/20/22 2150 01/21/22 0457 01/21/22 0807  BP: 1'07/78 97/67 97/66 '$ 94/70  Pulse: (!) 104 98 91 87  Resp: '16 18 20 18  '$ Temp: 97.7 F (36.5 C) 97.9 F (36.6 C) 98.1 F (36.7 C) 97.9 F (36.6 C)  TempSrc: Oral Oral Oral Oral  SpO2: 97% 96% 96% 94%  Weight:      Height:       Physical Exam Vitals and nursing note reviewed.  Constitutional:      General: He is not in acute distress.    Appearance: He is obese. He is not toxic-appearing.  HENT:     Head: Normocephalic and atraumatic.     Mouth/Throat:     Mouth: Mucous membranes are moist.     Pharynx: Oropharynx is clear.  Eyes:     Conjunctiva/sclera:  Conjunctivae normal.     Pupils: Pupils are equal, round, and reactive to light.  Cardiovascular:     Rate and Rhythm: Normal rate and regular rhythm.     Heart sounds: No murmur heard.    No gallop.  Pulmonary:     Effort: Pulmonary effort is normal. No respiratory distress.     Breath sounds: Wheezing (Expiratory wheezing heard throughout) present. No rhonchi or rales.  Abdominal:     General: Bowel sounds are normal.  Musculoskeletal:     Right lower leg: No edema.     Left lower leg: No edema.  Skin:    General: Skin is warm and dry.  Neurological:     General: No focal deficit present.     Mental Status: He is alert and oriented to person, place, and time.  Psychiatric:        Mood and Affect: Mood normal.        Behavior: Behavior normal.    Data Reviewed:  BMP obtained on 01/05/2022 with creatinine of 1.2.  Creatinine on admission elevated at 1.82.  A1c on admission 7.2%.  CMP this a.m. with potassium of 5.4, creatinine of 2.54, calcium of 7.8, glucose of 195 and GFR of 29.Magnesium low at 1.6.  CBC with WBC of 11.5, hemoglobin of 10.9.  There are no new results to review at this time.   Family Communication: No family at bedside Primary team communication: Discussed with primary physician, Dr. Dahlia Byes.  Thank you very much for involving [place] in the care of your patient.  Author: Jose Persia, MD 01/21/2022 2:52 PM  For on call review [email or web address].

## 2022-01-21 NOTE — Progress Notes (Signed)
[facility] Day(s): 1.   Post op day(s): 1 Day Post-Op.   Interval History:  Patient seen and examined No acute events or new complaints overnight.  Patient reports he is overall feeling good given circumstances Abdominal soreness; with movement  No fever, chills, nausea, emesis  Mild leukocytosis; 11.5K; likely reactive from surgery and making improvements Hgb 10.9 (11.1 immediately post-op) He does have AKI; sCr - 2.54; UO - 710 ccs  Mild Hyperkalemia to 5.4 Surgical drain with 175 ccs out; serosanguinous He is on FLD; tolerating. No trouble swallowing   Vital signs in last 24 hours: [min-max] current  Temp:  [97.5 F (36.4 C)-98.1 F (36.7 C)] 98.1 F (36.7 C) (11/08 0457) Pulse Rate:  [91-104] 91 (11/08 0457) Resp:  [14-20] 20 (11/08 0457) BP: (97-124)/(52-91) 97/66 (11/08 0457) SpO2:  [93 %-98 %] 96 % (11/08 0457) Weight:  [111.1 kg] 111.1 kg (11/07 1008)     Height: '6\' 2"'$  (188 cm) Weight: 111.1 kg BMI (Calculated): 31.44   Intake/Output last 2 shifts:  11/07 0701 - 11/08 0700 In: 4071.9 [P.O.:340; I.V.:3231.9; IV Piggyback:500] Out: 1485 [Urine:710; Drains:175; Blood:600]   Physical Exam:  Constitutional: alert, cooperative and no distress  Respiratory: breathing non-labored at rest  Cardiovascular: regular rate and sinus rhythm  Gastrointestinal: Soft, incisional soreness, non-distended, no rebound/guarding. Surgical drain in left abdomen; output serosanguinous Genitourinary: Foley catheter in place; clear urine  Integumentary: Laparoscopic and laparotomy incisions are CDI with staples, honeycomb dressings.   Labs:     Latest Ref Rng & Units 01/20/2022    8:11 PM 12/30/2021    8:18 AM 10/23/2021    8:43 AM  CBC  WBC 4.0 - 10.5 K/uL 16.7   6.2   Hemoglobin 13.0 - 17.0 g/dL 11.1  12.9  13.2   Hematocrit 39.0 - 52.0 % 35.2  39.5  40.0   Platelets 150 - 400 K/uL 223   216       Latest Ref Rng & Units 01/20/2022     8:11 PM 10/23/2021    8:43 AM 05/30/2021    4:00 AM  CMP  Glucose 70 - 99 mg/dL  172  130   BUN 6 - 24 mg/dL  13  19   Creatinine 0.61 - 1.24 mg/dL 1.82  1.22  1.19   Sodium 134 - 144 mmol/L  138  138   Potassium 3.5 - 5.2 mmol/L  4.3  3.7   Chloride 96 - 106 mmol/L  100  101   CO2 20 - 29 mmol/L  23  27   Calcium 8.7 - 10.2 mg/dL  9.9  9.0   Total Protein 6.0 - 8.5 g/dL  6.9    Total Bilirubin 0.0 - 1.2 mg/dL  0.3    Alkaline Phos 44 - 121 IU/L  70    AST 0 - 40 IU/L  25    ALT 0 - 44 IU/L  29       Imaging studies: No new pertinent imaging studies   Assessment/Plan:  56 y.o. male 1 Day Post-Op s/p attempted laparoscopic paraesophageal hernia repair converted to open secondary to bleeding from branch of left gastric artery with loose Nissen fundoplication    - Continue full liquid diet; Will review dietary recommendations/handout provided   - Will continue foley catheter today given AKI; monitor UO   - Continue surgical drain; monitor and record output  - Monitor abdominal examination; on-going bowel function   - Pain control  prn; antiemetics prn - Okay to mobilize; will get PT - Monitor H&H; stable; no evidence of continued bleed   - Discharge Planning; Overall doing well given circumstances. Does have AKI. No evidence of bleeding. Will continue to monitor   All of the above findings and recommendations were discussed with the patient, and the medical team, and all of patient's questions were answered to his expressed satisfaction.  -- Edison Simon, PA-C [facility] 01/21/2022, 7:19 AM M-F: 7am - 4pm

## 2022-01-21 NOTE — Progress Notes (Signed)
Mobility Specialist - Progress Note  Pre-mobility: SpO2 94% During mobility: SpO2 82% Post-mobility: SPO2 97%   01/21/22 1120  Mobility  Activity Ambulated independently in hallway  Level of Assistance Modified independent, requires aide device or extra time  Assistive Device Front wheel walker (80 ft without AD)  Distance Ambulated (ft) 180 ft  Activity Response Tolerated well  Mobility Referral Yes  $Mobility charge 1 Mobility   Pt in fowler position upon entry, utilizing RA. Pt completed bed mob, STS to RW and ambulation ModI. Pt ambulated 64 ft around NS with RW, and another 32 ft without AD. During amb pt O2 destat to 82%, pt voiced concerns of SOB. Pt request for inhaler, RN notified. Pt returned to room, left supine with alarm set and O2 >94%.   Candie Mile Mobility Specialist 01/21/22 11:51 AM

## 2022-01-21 NOTE — Assessment & Plan Note (Addendum)
Patient follows with Dr. Gabriel Carina at [facility] Endocrinology. Current home regimen includes Trulicity 3.0 mg weekly, Metforrmin 1000 mg BID, and NPH 70/30 52 units in the morning and 56 units in the evening.   - Start Semglee 25 units nightly - Increase meal coverage to 10 units - Continue resistant scale SSI - Transition to home 70/30 once on regular diet - Continue holding home Trulicity and metformin

## 2022-01-21 NOTE — Assessment & Plan Note (Signed)
-   Dose of home gabapentin decreased to 200 mg 3 times daily given reduced renal function at this time.

## 2022-01-21 NOTE — Evaluation (Signed)
Physical Therapy Evaluation Patient Details Name: Darren Allen MRN: [id number] DOB: 20-Jan-1966 Today's Date: 01/21/2022  History of Present Illness  Pt is a 56 yo male s/p paraesophageal hernia converted to open. PMH of COPD, sleep apnea, HTN, PVD, DM.  Clinical Impression  Pt alert, agreeable to PT with some encouragement, did report elevated pain, RN informed and gave during session. Pt stated at baseline he is independent, has a significant other that can assist at discharge as needed.  Pt instructed in log roll technique, CGA with reliance on bed rails. Pt most limited during session due to wheezing (chronic issue at baseline). SpO2 on room air ranged from 88-92%, and pt requested breathing treatment after mobility, RN notified. He was able to sit <> stand with RW and CGA, effortful and cued for safe hand placement. He ambulated ~183f total with RW and CGA, instructed on standing rest breaks to address SOB.  Overall the patient demonstrated deficits (see "PT Problem List") that impede the patient's functional abilities, safety, and mobility and would benefit from skilled PT intervention. Recommendation at this time is HHPT to maximize safety, function and to return to PLOF.        Recommendations for follow up therapy are one component of a multi-disciplinary discharge planning process, led by the attending physician.  Recommendations may be updated based on patient status, additional functional criteria and insurance authorization.  Follow Up Recommendations Home health PT      Assistance Recommended at Discharge Set up Supervision/Assistance  Patient can return home with the following  A lot of help with bathing/dressing/bathroom;Assistance with cooking/housework;Assist for transportation;Help with stairs or ramp for entrance    Equipment Recommendations None recommended by PT  Recommendations for Other Services       Functional Status Assessment Patient has had a recent decline  in their functional status and demonstrates the ability to make significant improvements in function in a reasonable and predictable amount of time.     Precautions / Restrictions Precautions Precautions: Fall Restrictions Weight Bearing Restrictions: No      Mobility  Bed Mobility Overal bed mobility: Needs Assistance Bed Mobility: Rolling, Sidelying to Sit Rolling: Min guard Sidelying to sit: Min guard, HOB elevated            Transfers Overall transfer level: Needs assistance Equipment used: Rolling walker (2 wheels) Transfers: Sit to/from Stand Sit to Stand: Min guard           General transfer comment: effortful    Ambulation/Gait Ambulation/Gait assistance: Min guard Gait Distance (Feet): 190 Feet Assistive device: Rolling walker (2 wheels)         General Gait Details: cued for standing rest break twice due to some wheezing noted, to address SOB (chronic issue at home)  Stairs            Wheelchair Mobility    Modified Rankin (Stroke Patients Only)       Balance Overall balance assessment: Needs assistance Sitting-balance support: Feet supported Sitting balance-Leahy Scale: Good       Standing balance-Leahy Scale: Fair                               Pertinent Vitals/Pain Pain Assessment Pain Assessment: Faces Faces Pain Scale: Hurts even more Pain Location: abdomen Pain Descriptors / Indicators: Grimacing, Guarding, Moaning Pain Intervention(s): Limited activity within patient's tolerance, Repositioned, Monitored during session, Patient requesting pain meds-RN notified    Home  Living Family/patient expects to be discharged to:: Private residence Living Arrangements: Spouse/significant other Available Help at Discharge: Family Type of Home: House Home Access: Stairs to enter;Ramped entrance   Entrance Stairs-Number of Steps: [place]: [place]: Conservation officer, nature (2 wheels);Cane - quad       Prior Function Prior Level of Function : Independent/Modified Independent;Driving                     Hand Dominance        Extremity/Trunk Assessment   Upper Extremity Assessment Upper Extremity Assessment: Overall WFL for tasks assessed    Lower Extremity Assessment Lower Extremity Assessment: Generalized weakness    Cervical / Trunk Assessment Cervical / Trunk Assessment: Normal (abdominal surgery)  Communication   Communication: No difficulties  Cognition Arousal/Alertness: Awake/alert Behavior During Therapy: WFL for tasks assessed/performed Overall Cognitive Status: Within Functional Limits for tasks assessed                                          General Comments      Exercises     Assessment/Plan    PT Assessment Patient needs continued PT services  PT Problem List Decreased strength;Decreased mobility;Decreased activity tolerance;Decreased balance;Decreased knowledge of use of DME       PT Treatment Interventions DME instruction;Therapeutic activities;Therapeutic exercise;Gait training;Stair training;Balance training;Functional mobility training;Neuromuscular re-education;Patient/family education    PT Goals (Current goals can be found in the Care Plan section)  Acute Rehab PT Goals Patient Stated Goal: to go home PT Goal Formulation: With patient Time For Goal Achievement: 02/04/22 Potential to Achieve Goals: Fair    Frequency Min 2X/week     Co-evaluation               AM-PAC PT "6 Clicks" Mobility  Outcome Measure Help needed turning from your back to your side while in a flat bed without using bedrails?: None Help needed moving from lying on your back to sitting on the side of a flat bed without using bedrails?: None Help needed moving to and from a bed to a chair (including a wheelchair)?: None Help needed standing up from a chair using your arms (e.g., wheelchair or bedside chair)?: None Help needed to walk  in hospital room?: A Little Help needed climbing 3-5 steps with a railing? : A Little 6 Click Score: 22    End of Session Equipment Utilized During Treatment: Gait belt Activity Tolerance: Patient tolerated treatment well Patient left: in chair;with call bell/phone within reach;with chair alarm set Nurse Communication: Mobility status;Patient requests pain meds (request for breathing treatment) PT Visit Diagnosis: Other abnormalities of gait and mobility (R26.89);History of falling (Z91.81);Muscle weakness (generalized) (M62.81)    Time: 1027-2536 PT Time Calculation (min) (ACUTE ONLY): 28 min   Charges:   PT Evaluation $PT Eval Low Complexity: 1 Low PT Treatments $Therapeutic Activity: 8-22 mins        Lieutenant Diego PT, DPT 4:02 PM,01/21/22

## 2022-01-21 NOTE — Assessment & Plan Note (Signed)
Likely due to AKI.  Not elevated enough to raise concern for cardiac complications.  - One-time dose of Lokelma

## 2022-01-21 NOTE — TOC Initial Note (Signed)
Transition of Care [facility]) - Initial/Assessment Note    Patient Details  Name: Darren Allen MRN: [id number] Date of Birth: [birth year]  Transition of Care [facility]) CM/SW Contact:    Beverly Sessions, RN Phone Number: 01/21/2022, 1:48 PM  Clinical Narrative:                     Transition of Care (TOC) Screening Note   Patient Details  Name: Darren Allen Date of Birth: June 11, 1965   Transition of Care [facility]) CM/SW Contact:    Beverly Sessions, RN Phone Number: 01/21/2022, 1:48 PM    Transition of Care Department [facility]) has reviewed patient and no TOC needs have been identified at this time. We will continue to monitor patient advancement through interdisciplinary progression rounds. If new patient transition needs arise, please place a TOC consult.       Patient Goals and CMS Choice        Expected Discharge Plan and Services                                                Prior Living Arrangements/Services                       Activities of Daily Living Home Assistive Devices/Equipment: None ADL Screening (condition at time of admission) Patient's cognitive ability adequate to safely complete daily activities?: Yes Is the patient deaf or have difficulty hearing?: No Does the patient have difficulty seeing, even when wearing glasses/contacts?: No Does the patient have difficulty concentrating, remembering, or making decisions?: No Patient able to express need for assistance with ADLs?: Yes Does the patient have difficulty dressing or bathing?: No Independently performs ADLs?: Yes (appropriate for developmental age) Does the patient have difficulty walking or climbing stairs?: No Weakness of Legs: None Weakness of Arms/Hands: None  Permission Sought/Granted                  Emotional Assessment              Admission diagnosis:  S/P repair of paraesophageal hernia [G95.621, Z87.19] Patient Active Problem List   Diagnosis  Date Noted   AKI (acute kidney injury) ([place]) 01/21/2022   S/P repair of paraesophageal hernia 30/86/5784   Umbilical hernia without obstruction and without gangrene 01/20/2022   Hiatal hernia    Colon cancer screening    Adenomatous polyp of colon    Abnormal MRI, lumbar spine (03/11/2020) 10/29/2021   DDD (degenerative disc disease), lumbosacral 10/29/2021   Lumbosacral lateral recess stenosis (Left: L5-S1) 10/29/2021   Elevated C-reactive protein (CRP) 09/18/2021   Chronic pain syndrome 09/08/2021   Pharmacologic therapy 09/08/2021   Disorder of skeletal system 09/08/2021   Problems influencing health status 09/08/2021   Lumbar facet syndrome 09/08/2021   Lumbosacral radiculopathy at S1 (Left) 09/08/2021   Chronic feet pain (2ry area of Pain) (Bilateral) 09/08/2021   Chronic ankle pain (Left) 09/08/2021   Abnormal NCS (nerve conduction studies) (02/20/2020) 09/08/2021   Neuropathy 06/19/2021   Gout 05/28/2021   PVD (peripheral vascular disease) ([place]) 05/28/2021   Hypertension    Chronic ankle pain (Bilateral) 07/25/2020   Chronic low back pain (1ry area of Pain) (Bilateral) (R>L) w/o sciatica 07/25/2020   Diabetic peripheral neuropathy ([place]) 12/30/2019   Other acquired hammer toe 11/28/2019   Ankle fracture  03/23/2017   Type 2 diabetes mellitus with diabetic neuropathy, without long-term current use of insulin ([place]) 04/16/2016   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) 01/17/2013   Acid reflux 01/17/2013   HLD (hyperlipidemia) 01/17/2013   Apnea, sleep 01/17/2013   Testicular hypofunction 01/17/2013   Allergic state 01/17/2013   Adiposity 07/06/2011   PCP:  Jon Billings, NP Pharmacy:   [facility] [place], [place] - [place] AT [facility] [address] [place] [address] Phone: [phone] Fax: [phone]  [id number] [address], [place] AT [address] [place] [address] Phone: [phone] Fax: [place] [phone], [place] - [place] [address] Phone: [phone] Fax: [phone]     Social Determinants of Health (SDOH) Interventions    Readmission Risk Interventions     No data to display

## 2022-01-21 NOTE — Progress Notes (Signed)
Pt given prn pain medications throughout the night with partial effects noted. Pt resting in brief intervals throughout the night. Ambulated with walker around entire unit with standby assist, tolerated fairly. Given pain meds prior and after ambulation.

## 2022-01-21 NOTE — Plan of Care (Signed)
  Problem: Coping: Goal: Ability to adjust to condition or change in health will improve Outcome: Progressing   Problem: Health Behavior/Discharge Planning: Goal: Ability to manage health-related needs will improve Outcome: Progressing

## 2022-01-21 NOTE — Progress Notes (Signed)
   01/21/22 2024  Assess: MEWS Score  Temp 98.7 F (37.1 C)  BP 139/75  MAP (mmHg) 93  Pulse Rate (!) 118 (RN International aid/development worker and [employer] notified)  Resp 18  SpO2 94 %  O2 Device Room Air  Assess: MEWS Score  MEWS Temp 0  MEWS Systolic 0  MEWS Pulse 2  MEWS RR 0  MEWS LOC 0  MEWS Score 2  MEWS Score Color Yellow  Assess: if the MEWS score is Yellow or Red  Were vital signs taken at a resting state? Yes  Focused Assessment Change from prior assessment (see assessment flowsheet)  Does the patient meet 2 or more of the SIRS criteria? Yes  Does the patient have a confirmed or suspected source of infection? No  MEWS guidelines implemented *See Row Information* Yes  Treat  MEWS Interventions Administered scheduled meds/treatments;Administered prn meds/treatments  Take Vital Signs  Increase Vital Sign Frequency  Yellow: Q 2hr X 2 then Q 4hr X 2, if remains yellow, continue Q 4hrs  Escalate  MEWS: Escalate Yellow: discuss with charge nurse/RN and consider discussing with provider and RRT  Notify: Charge Nurse/RN  Name of Charge Nurse/RN Notified Dawn, RN  Date Charge Nurse/RN Notified 01/21/22  Time Charge Nurse/RN Notified 2125  Assess: SIRS CRITERIA  SIRS Temperature  0  SIRS Pulse 1  SIRS Respirations  0  SIRS WBC 1  SIRS Score Sum  2

## 2022-01-21 NOTE — Assessment & Plan Note (Addendum)
Per chart review, patient has a history of CKD Stage 3a with baseline creatinine between 1.2 - 1.5. Most recently 1.2 in Oct 2023. On admission yesterday, creatinine elevated at 1.8 with further increase to 2.5 today. Etiology likely multifactorial in the setting of relatively low BP compared to patient's baseline, continued use of nephrotoxic agents and insensible losses.   - Urinalysis - Renal ultrasound - If renal ultrasound is without abnormalities, can consider removing Foley and starting a voiding challenge - Hold home nephrotoxic agents including lisinopril and hydrochlorothiazide - Decrease dose of home gabapentin given GFR less than 30 now.  Can increase once renal function improves. - 1 L NS bolus followed by continued maintenance IV fluids  ADDENDUM: Urinalysis with pyuria, however patient is asymptomatic, so no indication to treat. Creatinine has improved with IVF. Continue maintenance IVF overnight.

## 2022-01-21 NOTE — Inpatient Diabetes Management (Signed)
Inpatient Diabetes Program Recommendations  AACE/ADA: New Consensus Statement on Inpatient Glycemic Control (2015)  Target Ranges:  Prepandial:   less than 140 mg/dL      Peak postprandial:   less than 180 mg/dL (1-2 hours)      Critically ill patients:  140 - 180 mg/dL   Lab Results  Component Value Date   GLUCAP 246 (H) 01/21/2022   HGBA1C 7.2 (H) 01/20/2022    Latest Reference Range & Units 01/20/22 10:03 01/20/22 16:11 01/20/22 17:08 01/20/22 21:51 01/21/22 08:08 01/21/22 11:29  Glucose-Capillary 70 - 99 mg/dL 127 (H) 228 (H) 237 (H) 354 (H) 186 (H) 246 (H)  (H): Data is abnormally high  Diabetes history: DM2 Outpatient Diabetes medications: 70/30 58 units ac breakfast, 54 units ac supper, Metformin 1 gm bid, Trulicity 3 mg q week Current orders for Inpatient glycemic control: Novolog 6 units tid meal coverage, Novolog 0-20 units tid, 0-5 units hs  Inpatient Diabetes Program Recommendations:   Noted received Decadron 10 mg x 1 on 01/20/22. Spoke with patient and clarified he is taking 70/30 insulin 58 units ac breakfast and 54 units ac dinner. Please consider: -Semglee 22 units qd (0.2 units/kg x 111.1 kg) -Consider changing to 70/30 when diet advances  Thank you, Nani Gasser. Hania Cerone, RN, MSN, CDE  Diabetes Coordinator Inpatient Glycemic Control Team Team Pager [phone] (8am-5pm) 01/21/2022 12:09 PM

## 2022-01-21 NOTE — Progress Notes (Signed)
       CROSS COVER NOTE  NAME: OLANREWAJU OSBORN MRN: [id number] DOB : 1965/07/26    Time of Service   2321 pm  HPI/Events of Note   Nurse reported patient with heart rate 125 and decrease oxygen sats to 88% which improved with 1.5 l [place] supplmemental oxygen.  Has sleep apnea and CPAP therapy QHS at home  Assessment and  Interventions   Assessment: General pale, mildly distressed with shortness of breath Neuro alert and oriented x4 CV ST S1S2  Pulm mildly dyspnic; BBS course with diminished bases GI - passing flatus, last BM 11/6JP drains without significant output Tachycardic low grade temp  AM labs showed stable hemoglobin hypomagnesemia this am 1.6 -only received one gram of IV mag Plan: Chest xray -  opacities - atelectasis vs pna in bilateral lung bases Repeat CBC CMP mag phos pro cal, lactic Duo neb every 4 hours prn wheezing Add flutter valve therapy with incentive spirometer Initiated on levaquin for suspected PNA but cannot rule out GI infection with procalcitonin 1.18 and WBC 11.4    Kathlene Cote NP [facility]

## 2022-01-21 NOTE — Assessment & Plan Note (Addendum)
Significant wheezing on examination, but patient denies any symptoms of increased shortness of breath, cough or sputum production.  No signs of exacerbation  - Continue home Trelegy - DuoNeb - Please bring albuterol inhaler to bedside

## 2022-01-21 NOTE — Assessment & Plan Note (Signed)
-   Hold home antihypertensives given AKI and relatively low blood pressure this morning

## 2022-01-21 NOTE — Assessment & Plan Note (Signed)
Postop day 1 of laparoscopic paraesophageal hernia repair converted to open secondary to bleeding from left gastric artery.  -Management per surgery

## 2022-01-21 NOTE — Assessment & Plan Note (Signed)
Per chart review, approximately 600 ccl of blood loss during surgery.  Hemoglobin decreased from 11.1-10.9.  I suspect this will continue to decline.  - CBC daily - Transfuse for hemoglobin less than 7 - Consider starting daily iron supplementation

## 2022-01-22 ENCOUNTER — Ambulatory Visit: Payer: HMO | Admitting: Nurse Practitioner

## 2022-01-22 ENCOUNTER — Inpatient Hospital Stay: Payer: HMO

## 2022-01-22 DIAGNOSIS — Z8719 Personal history of other diseases of the digestive system: Secondary | ICD-10-CM | POA: Diagnosis not present

## 2022-01-22 DIAGNOSIS — Z9889 Other specified postprocedural states: Secondary | ICD-10-CM | POA: Diagnosis not present

## 2022-01-22 LAB — HEPATIC FUNCTION PANEL
ALT: 70 U/L — ABNORMAL HIGH (ref 0–44)
AST: 50 U/L — ABNORMAL HIGH (ref 15–41)
Albumin: 4 g/dL (ref 3.5–5.0)
Alkaline Phosphatase: 40 U/L (ref 38–126)
Bilirubin, Direct: 0.3 mg/dL — ABNORMAL HIGH (ref 0.0–0.2)
Indirect Bilirubin: 1.3 mg/dL — ABNORMAL HIGH (ref 0.3–0.9)
Total Bilirubin: 1.6 mg/dL — ABNORMAL HIGH (ref 0.3–1.2)
Total Protein: 6.7 g/dL (ref 6.5–8.1)

## 2022-01-22 LAB — CBC
HCT: 29.9 % — ABNORMAL LOW (ref 39.0–52.0)
HCT: 29.6 % — ABNORMAL LOW (ref 39.0–52.0)
HCT: 28.3 % — ABNORMAL LOW (ref 39.0–52.0)
Hemoglobin: 9.5 g/dL — ABNORMAL LOW (ref 13.0–17.0)
Hemoglobin: 9.4 g/dL — ABNORMAL LOW (ref 13.0–17.0)
Hemoglobin: 9.1 g/dL — ABNORMAL LOW (ref 13.0–17.0)
MCH: 27.4 pg (ref 26.0–34.0)
MCH: 27.6 pg (ref 26.0–34.0)
MCH: 27.6 pg (ref 26.0–34.0)
MCHC: 31.8 g/dL (ref 30.0–36.0)
MCHC: 31.8 g/dL (ref 30.0–36.0)
MCHC: 32.2 g/dL (ref 30.0–36.0)
MCV: 86.2 fL (ref 80.0–100.0)
MCV: 86.8 fL (ref 80.0–100.0)
MCV: 85.8 fL (ref 80.0–100.0)
Platelets: 177 10*3/uL (ref 150–400)
Platelets: 170 10*3/uL (ref 150–400)
Platelets: 177 10*3/uL (ref 150–400)
RBC: 3.47 MIL/uL — ABNORMAL LOW (ref 4.22–5.81)
RBC: 3.41 MIL/uL — ABNORMAL LOW (ref 4.22–5.81)
RBC: 3.3 MIL/uL — ABNORMAL LOW (ref 4.22–5.81)
RDW: 14.6 % (ref 11.5–15.5)
RDW: 14.5 % (ref 11.5–15.5)
RDW: 14.5 % (ref 11.5–15.5)
WBC: 10.9 10*3/uL — ABNORMAL HIGH (ref 4.0–10.5)
WBC: 11.4 10*3/uL — ABNORMAL HIGH (ref 4.0–10.5)
WBC: 12.6 10*3/uL — ABNORMAL HIGH (ref 4.0–10.5)
nRBC: 0 % (ref 0.0–0.2)
nRBC: 0 % (ref 0.0–0.2)
nRBC: 0 % (ref 0.0–0.2)

## 2022-01-22 LAB — COMPREHENSIVE METABOLIC PANEL
ALT: 73 U/L — ABNORMAL HIGH (ref 0–44)
ALT: 60 U/L — ABNORMAL HIGH (ref 0–44)
AST: 53 U/L — ABNORMAL HIGH (ref 15–41)
AST: 39 U/L (ref 15–41)
Albumin: 4.1 g/dL (ref 3.5–5.0)
Albumin: 3.6 g/dL (ref 3.5–5.0)
Alkaline Phosphatase: 37 U/L — ABNORMAL LOW (ref 38–126)
Alkaline Phosphatase: 40 U/L (ref 38–126)
Anion gap: 6 (ref 5–15)
Anion gap: 7 (ref 5–15)
BUN: 24 mg/dL — ABNORMAL HIGH (ref 6–20)
BUN: 17 mg/dL (ref 6–20)
CO2: 23 mmol/L (ref 22–32)
CO2: 21 mmol/L — ABNORMAL LOW (ref 22–32)
Calcium: 8 mg/dL — ABNORMAL LOW (ref 8.9–10.3)
Calcium: 8.4 mg/dL — ABNORMAL LOW (ref 8.9–10.3)
Chloride: 107 mmol/L (ref 98–111)
Chloride: 107 mmol/L (ref 98–111)
Creatinine, Ser: 1.51 mg/dL — ABNORMAL HIGH (ref 0.61–1.24)
Creatinine, Ser: 1.21 mg/dL (ref 0.61–1.24)
GFR, Estimated: 53 mL/min — ABNORMAL LOW (ref >60)
GFR, Estimated: >60 mL/min (ref >60)
Glucose, Bld: 173 mg/dL — ABNORMAL HIGH (ref 70–99)
Glucose, Bld: 187 mg/dL — ABNORMAL HIGH (ref 70–99)
Potassium: 4.9 mmol/L (ref 3.5–5.1)
Potassium: 4.7 mmol/L (ref 3.5–5.1)
Sodium: 136 mmol/L (ref 135–145)
Sodium: 135 mmol/L (ref 135–145)
Total Bilirubin: 1.3 mg/dL — ABNORMAL HIGH (ref 0.3–1.2)
Total Bilirubin: 1.6 mg/dL — ABNORMAL HIGH (ref 0.3–1.2)
Total Protein: 6.9 g/dL (ref 6.5–8.1)
Total Protein: 6.9 g/dL (ref 6.5–8.1)

## 2022-01-22 LAB — SURGICAL PATHOLOGY

## 2022-01-22 LAB — GLUCOSE, CAPILLARY
Glucose-Capillary: 204 mg/dL — ABNORMAL HIGH (ref 70–99)
Glucose-Capillary: 127 mg/dL — ABNORMAL HIGH (ref 70–99)
Glucose-Capillary: 157 mg/dL — ABNORMAL HIGH (ref 70–99)
Glucose-Capillary: 167 mg/dL — ABNORMAL HIGH (ref 70–99)
Glucose-Capillary: 196 mg/dL — ABNORMAL HIGH (ref 70–99)

## 2022-01-22 LAB — MAGNESIUM: Magnesium: 1.7 mg/dL (ref 1.7–2.4)

## 2022-01-22 LAB — TROPONIN I (HIGH SENSITIVITY)
Troponin I (High Sensitivity): 8 ng/L (ref <18)
Troponin I (High Sensitivity): 9 ng/L (ref <18)

## 2022-01-22 LAB — LACTIC ACID, PLASMA
Lactic Acid, Venous: 1.6 mmol/L (ref 0.5–1.9)
Lactic Acid, Venous: 1.1 mmol/L (ref 0.5–1.9)
Lactic Acid, Venous: 0.8 mmol/L (ref 0.5–1.9)

## 2022-01-22 LAB — PHOSPHORUS: Phosphorus: 2.2 mg/dL — ABNORMAL LOW (ref 2.5–4.6)

## 2022-01-22 LAB — BASIC METABOLIC PANEL
Anion gap: 10 (ref 5–15)
BUN: 23 mg/dL — ABNORMAL HIGH (ref 6–20)
CO2: 19 mmol/L — ABNORMAL LOW (ref 22–32)
Calcium: 8.4 mg/dL — ABNORMAL LOW (ref 8.9–10.3)
Chloride: 109 mmol/L (ref 98–111)
Creatinine, Ser: 1.41 mg/dL — ABNORMAL HIGH (ref 0.61–1.24)
GFR, Estimated: 58 mL/min — ABNORMAL LOW (ref >60)
Glucose, Bld: 181 mg/dL — ABNORMAL HIGH (ref 70–99)
Potassium: 4.9 mmol/L (ref 3.5–5.1)
Sodium: 138 mmol/L (ref 135–145)

## 2022-01-22 LAB — BRAIN NATRIURETIC PEPTIDE: B Natriuretic Peptide: 55.8 pg/mL (ref 0.0–100.0)

## 2022-01-22 LAB — TSH: TSH: 0.94 u[IU]/mL (ref 0.350–4.500)

## 2022-01-22 LAB — PROCALCITONIN: Procalcitonin: 1.18 ng/mL

## 2022-01-22 MED ORDER — SODIUM PHOSPHATES 45 MMOLE/15ML IV SOLN
15.0000 mmol | Freq: Once | INTRAVENOUS | Status: AC
  Administered 2022-01-22: 15 mmol via INTRAVENOUS
  Filled 2022-01-22: qty 5

## 2022-01-22 MED ORDER — CYCLOBENZAPRINE HCL 10 MG PO TABS
10.0000 mg | ORAL_TABLET | Freq: Three times a day (TID) | ORAL | Status: DC
  Administered 2022-01-22 – 2022-01-25 (×9): 10 mg via ORAL
  Filled 2022-01-22 (×9): qty 1

## 2022-01-22 MED ORDER — PIPERACILLIN-TAZOBACTAM 3.375 G IVPB
3.3750 g | Freq: Three times a day (TID) | INTRAVENOUS | Status: DC
  Administered 2022-01-22 – 2022-01-24 (×6): 3.375 g via INTRAVENOUS
  Filled 2022-01-22 (×6): qty 50

## 2022-01-22 MED ORDER — BUDESONIDE 0.25 MG/2ML IN SUSP
0.2500 mg | Freq: Two times a day (BID) | RESPIRATORY_TRACT | Status: DC
  Administered 2022-01-22 – 2022-01-25 (×7): 0.25 mg via RESPIRATORY_TRACT
  Filled 2022-01-22 (×7): qty 2

## 2022-01-22 MED ORDER — PREGABALIN 50 MG PO CAPS
100.0000 mg | ORAL_CAPSULE | Freq: Three times a day (TID) | ORAL | Status: DC
  Administered 2022-01-22 – 2022-01-25 (×10): 100 mg via ORAL
  Filled 2022-01-22 (×10): qty 2

## 2022-01-22 MED ORDER — LORAZEPAM 2 MG PO TABS
2.0000 mg | ORAL_TABLET | Freq: Four times a day (QID) | ORAL | Status: DC | PRN
  Administered 2022-01-22: 2 mg via ORAL
  Filled 2022-01-22: qty 1

## 2022-01-22 MED ORDER — LEVOFLOXACIN IN D5W 750 MG/150ML IV SOLN
750.0000 mg | INTRAVENOUS | Status: DC
  Administered 2022-01-22: 750 mg via INTRAVENOUS
  Filled 2022-01-22: qty 150

## 2022-01-22 MED ORDER — UMECLIDINIUM-VILANTEROL 62.5-25 MCG/ACT IN AEPB
1.0000 | INHALATION_SPRAY | Freq: Every day | RESPIRATORY_TRACT | Status: DC
  Administered 2022-01-23 – 2022-01-25 (×3): 1 via RESPIRATORY_TRACT
  Filled 2022-01-22: qty 14

## 2022-01-22 MED ORDER — MAGNESIUM SULFATE 2 GM/50ML IV SOLN
2.0000 g | Freq: Once | INTRAVENOUS | Status: AC
  Administered 2022-01-22: 2 g via INTRAVENOUS
  Filled 2022-01-22: qty 50

## 2022-01-22 MED ORDER — INSULIN ASPART 100 UNIT/ML IJ SOLN
0.0000 [IU] | Freq: Three times a day (TID) | INTRAMUSCULAR | Status: DC
  Administered 2022-01-22: 2 [IU] via SUBCUTANEOUS
  Administered 2022-01-23: 1 [IU] via SUBCUTANEOUS
  Administered 2022-01-24: 3 [IU] via SUBCUTANEOUS
  Administered 2022-01-24 (×2): 2 [IU] via SUBCUTANEOUS
  Administered 2022-01-25: 1 [IU] via SUBCUTANEOUS
  Filled 2022-01-22 (×7): qty 1

## 2022-01-22 MED ORDER — ATENOLOL 25 MG PO TABS
25.0000 mg | ORAL_TABLET | Freq: Every day | ORAL | Status: DC
  Administered 2022-01-22: 25 mg via ORAL
  Filled 2022-01-22 (×2): qty 1

## 2022-01-22 MED ORDER — FUROSEMIDE 10 MG/ML IJ SOLN
20.0000 mg | Freq: Once | INTRAMUSCULAR | Status: AC
  Administered 2022-01-22: 20 mg via INTRAVENOUS
  Filled 2022-01-22: qty 4

## 2022-01-22 MED ORDER — PREDNISONE 20 MG PO TABS
20.0000 mg | ORAL_TABLET | Freq: Every day | ORAL | Status: DC
  Administered 2022-01-22 – 2022-01-24 (×3): 20 mg via ORAL
  Filled 2022-01-22 (×3): qty 1

## 2022-01-22 NOTE — TOC Progression Note (Signed)
Transition of Care [facility]) - Progression Note    Patient Details  Name: Darren Allen MRN: [id number] Date of Birth: September 17, 1965  Transition of Care [facility]) CM/SW Contact  Beverly Sessions, RN Phone Number: 01/22/2022, 3:16 PM  Clinical Narrative:     Patient now with high risk for readmission score.  Currently in rapid response.  Still requiring acute O2.  PT eval pending  TOC to follow up at appropriate time        Expected Discharge Plan and Services                                                 Social Determinants of Health (SDOH) Interventions    Readmission Risk Interventions     No data to display

## 2022-01-22 NOTE — Progress Notes (Signed)
PT Cancellation Note  Patient Details Name: Darren Allen MRN: [id number] DOB: Aug 13, 1965   Cancelled Treatment:    Reason Eval/Treat Not Completed: Other (comment). Pt with rapid response this PM, PT to re-attempt as able.    Lieutenant Diego PT, DPT 2:16 PM,01/22/22

## 2022-01-22 NOTE — Progress Notes (Signed)
Arrived to room for RR overhead page and call. On arrival RRT team members present. ICU CN, also MD present. Pt appears awake alert oriented. Maintaining airway.

## 2022-01-22 NOTE — Progress Notes (Addendum)
PROGRESS NOTE Darren Allen  ZOX:[id number] DOB: 02/20/1966 DOA: 01/20/2022 PCP: Jon Billings, NP   Brief Narrative/Hospital Course: 56 y.o.m w/ T2DM W/ Peripheral neuropathy,HTN,COPD,CKD stage IIIa, chronic pain on Norco, class I obesity ,OSA on CPAP s/p paraesophageal and umbilical hernia repair, TRH consulted for medical managemen    Subjective: Seen and examined this morning, shortness of breath little better Had a rough night- was dyspneic cx atelectasis vs PNA- placed on Levaquin empirically given proc cal is also up. Did not use his CPAP last night. Complains of shortness of breath unable to take deep breath due to abdominal pain also has wheezing   Assessment and Plan: Principal Problem:   S/P repair of paraesophageal hernia Active Problems:   AKI (acute kidney injury) ([place])   Type 2 diabetes mellitus with diabetic neuropathy, without long-term current use of insulin (HCC)   Hyperkalemia   Hypertension   Chronic obstructive pulmonary disease (HCC)   Acute blood loss anemia   Diabetic peripheral neuropathy ([place])   Umbilical hernia without obstruction and without gangrene  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 On FLD,pain control cont plan as per primary surgery team   AKI on CKD 3A  b/l creat 1.1 - 1.2 Mild metabolic acidosis: Creat peaked to 2.5.likely multifactorial etiology due to low BP, insensible loss, periop.  Renal ultrasound unremarkable.  UA WBC 6-10, leukocyte trace protein 30.  Continue to hold home lisinopril and HCTZ, renally dose medication including gabapentin, keep on gentle IV hydration with oral intake is marginal renal function improving monitor intake output Recent Labs    05/28/21 1148 05/29/21 0608 05/30/21 0400 10/23/21 0843 01/20/22 2011 01/21/22 0636 01/21/22 1747 01/22/22 0102 01/22/22 0324  BUN 16  --  19 13  --  33* 32* 24* 23*   CREATININE 1.15 1.08 1.19 1.22 1.82* 2.54* 1.91* 1.51* 1.41*   Type 2 diabetes mellitus with diabetic neuropathy without long-term insulin:  follows with Dr. Gabriel Carina at [facility]. PTA Trulicity 3.0 mg weekly, Metforrmin 1000 mg BID, and NPH 70/30 52 units in the morning and 56 units in the evening.  Has hyperglycemia.  His current Semglee 25 units nightly, meal coverage 10 units, resistant scale SSI.  Once on regular diet transition to 70/30 Home insulin.  Holding Trulicity metformin.  Monitor closely while on steroid.  Home gabapentin dose decreased to 200 mg 3 times daily due to renal failure  Recent Labs  Lab 01/20/22 2011 01/20/22 2151 01/21/22 0808 01/21/22 1129 01/21/22 1711 01/21/22 2129 01/22/22 0743  GLUCAP  --    < > 186* 246* 105* 129* 204*  HGBA1C 7.2*  --   --   --   --   --   --    < > = values in this interval not displayed.     Mild hyperkalemia got Lokelma.  Monitor K stable Recent Labs  Lab 01/21/22 0636 01/21/22 1747 01/22/22 0102 01/22/22 0324  K 5.4* 4.3 4.9 4.9   Hypomagnesemia hypophosphatemia repeat lab   Hypertension: BP controlled.  Home lisinopril HCTZ remains on hold due to AKI   OSA on CPAP: Ordered for the night COPD with mild exacerbation having wheezing shortness of breath difficulty with deep breath, encourage incentive spirometry.  Add low-dose steroid discussed with primary team, add Pulmicort nebulizer continue Breo Ellipta/Incruse and nebulizer.  Overnight needed oxygen currently removed  Dyspnea in the setting of OSA/COPD, abdominal pain limiting lung/diaphragm movement.  Chest  x-ray atelectasis versus pneumonia on empiric antibiotics started overnight given elevated procalcitonin.  Continue same monitor, encourage incentive spirometry.  ABLA: 600 cc of blood loss during surgery monitor hemoglobin transfuse if less than 7. Recent Labs  Lab 01/20/22 2011 01/21/22 0636 01/22/22 0102 01/22/22 0324  HGB 11.1* 10.9* 9.5* 9.4*   HCT 35.2* 34.3* 29.9* 29.6*    Mild transaminitis with Elevated TB 1.3: Dose of home gabapentin Class I Obesity:Patient's Body mass index is 31.46 kg/m. : Will benefit with PCP follow-up, weight loss  healthy lifestyle and outpatient sleep evaluation.  Addendum: Rapid response called- I immediately arrived at bedside 1: 55 pm Examined the patient complains of similar difficulty taking deep breath due to abdominal pain has epigastric and midsternal discomfort and pain. Denies left-sided chest pain.Appears anxious. Heart rate in 129 no hypoxia placed on 1 L nasal cannula Lung examination diminished but no wheezing, Wife at the bedside stated he always has a fast heart rate 100-120 at baseline Ordered EKG TSH troponin,lactic acid, stat H&H, CXR, bnp. Primary team has been notified to address as well- consider transfer to tele/progressive bed if doe not improve.  DVT prophylaxis: enoxaparin (LOVENOX) injection 40 mg Start: 01/21/22 1000 SCDs Start: 01/20/22 1928 Code Status:   Code Status: Full Code Family Communication: plan of care discussed with patient Discussed with primary surgical service.   Patient status is: Inpatient because of renal failure, dyspnea Level of care: Med-Surg   Dispo: The patient is from: home            Anticipated disposition: Home Mobility Assessment (last 72 hours)     Mobility Assessment     Row Name 01/22/22 0900 01/21/22 2000 01/21/22 1915 01/21/22 1556 01/21/22 0900   Does patient have an order for bedrest or is patient medically unstable No - Continue assessment No - Continue assessment No - Continue assessment -- No - Continue assessment   What is the highest level of mobility based on the progressive mobility assessment? Level 5 (Walks with assist in room/hall) - Balance while stepping forward/back and can walk in room with assist - Complete Level 5 (Walks with assist in room/hall) - Balance while stepping forward/back and can walk in room with  assist - Complete Level 5 (Walks with assist in room/hall) - Balance while stepping forward/back and can walk in room with assist - Complete Level 5 (Walks with assist in room/hall) - Balance while stepping forward/back and can walk in room with assist - Complete Level 5 (Walks with assist in room/hall) - Balance while stepping forward/back and can walk in room with assist - Complete    Row Name 01/20/22 2050 01/20/22 1800         Does patient have an order for bedrest or is patient medically unstable No - Continue assessment No - Continue assessment      What is the highest level of mobility based on the progressive mobility assessment? Level 5 (Walks with assist in room/hall) - Balance while stepping forward/back and can walk in room with assist - Complete Level 5 (Walks with assist in room/hall) - Balance while stepping forward/back and can walk in room with assist - Complete                Objective: Vitals last 24 hrs: Vitals:   01/22/22 0413 01/22/22 0600 01/22/22 0615 01/22/22 0742  BP: 138/86   134/87  Pulse: (!) 109   (!) 110  Resp: '19 18 18 18  '$ Temp: 98.5 F (  36.9 C)   97.8 F (36.6 C)  TempSrc:    Oral  SpO2: 98%   96%  Weight:      Height:       Weight change:   Physical Examination: General exam: alert awake, mildly anxious obese older than stated age HEENT:Oral mucosa moist, Ear/Nose WNL grossly Respiratory system: bilaterally air entry present with diffuse expiratory wheezing, unable to take deep breath mildly short of breath Cardiovascular system: S1 & S2 +, No JVD. Gastrointestinal system: Abdomen soft, surgical site with drain in place, dressing intact , abdomen is full tender BS hypoactive. Nervous System:Alert, awake, moving extremities. Extremities: LE edema negative , distal peripheral pulses palpable.  Skin: No rashes,no icterus. MSK: Normal muscle bulk,tone, power  Medications reviewed:  Scheduled Meds:  acetaminophen  1,000 mg Oral Q6H    amitriptyline  30 mg Oral QHS   budesonide (PULMICORT) nebulizer solution  0.25 mg Nebulization BID   cyclobenzaprine  5 mg Oral TID   enoxaparin (LOVENOX) injection  40 mg Subcutaneous Q24H   fluticasone  1 spray Each Nare Daily   fluticasone furoate-vilanterol  1 puff Inhalation Daily   And   umeclidinium bromide  1 puff Inhalation Daily   gabapentin  200 mg Oral TID   insulin aspart  0-20 Units Subcutaneous TID WC   insulin aspart  10 Units Subcutaneous TID WC   insulin glargine-yfgn  25 Units Subcutaneous QHS   lidocaine  2 patch Transdermal Q24H   montelukast  10 mg Oral Daily   Continuous Infusions:  lactated ringers 100 mL/hr at 01/22/22 0400   levofloxacin (LEVAQUIN) IV 100 mL/hr at 01/22/22 0600   methocarbamol (ROBAXIN) IV        Diet Order             DIET SOFT Room service appropriate? Yes; Fluid consistency: Thin  Diet effective now                  Intake/Output Summary (Last 24 hours) at 01/22/2022 1018 Last data filed at 01/22/2022 0900 Gross per 24 hour  Intake 4347.31 ml  Output 3435 ml  Net 912.31 ml   Net IO Since Admission: 3,499.21 mL [01/22/22 1018]  Wt Readings from Last 3 Encounters:  01/20/22 111.1 kg  01/14/22 111.6 kg  01/13/22 111.1 kg    Unresulted Labs (From admission, onward)     Start     Ordered   01/27/22 0500  Creatinine, serum  (enoxaparin (LOVENOX)    CrCl >/= 30 ml/min)  Weekly,   TIMED     Comments: while on enoxaparin therapy    01/20/22 1927   01/23/22 0500  Comprehensive metabolic panel  Daily at 5am,   R      01/22/22 0716   01/23/22 0300  Hemoglobin and hematocrit, blood  Daily at 5am,   R      01/22/22 1015          Data Reviewed: I have personally reviewed following labs and imaging studies CBC: Recent Labs  Lab 01/20/22 2011 01/21/22 0636 01/22/22 0102 01/22/22 0324  WBC 16.7* 11.5* 10.9* 11.4*  HGB 11.1* 10.9* 9.5* 9.4*  HCT 35.2* 34.3* 29.9* 29.6*  MCV 85.9 87.1 86.2 86.8  PLT 223 213 177 272    Basic Metabolic Panel: Recent Labs  Lab 01/20/22 2011 01/21/22 0636 01/21/22 1747 01/22/22 0102 01/22/22 0324  NA  --  137 136 136 138  K  --  5.4* 4.3 4.9 4.9  CL  --  109 108 107 109  CO2  --  18* 22 23 19*  GLUCOSE  --  195* 223* 173* 181*  BUN  --  33* 32* 24* 23*  CREATININE 1.82* 2.54* 1.91* 1.51* 1.41*  CALCIUM  --  7.8* 7.5* 8.0* 8.4*  MG  --  1.6* 1.6* 1.7  --   PHOS  --   --   --  2.2*  --    GFR: Estimated Creatinine Clearance: 75.8 mL/min (A) (by C-G formula based on SCr of 1.41 mg/dL (H)). Liver Function Tests: Recent Labs  Lab 01/22/22 0102 01/22/22 0324  AST 53* 50*  ALT 73* 70*  ALKPHOS 37* 40  BILITOT 1.3* 1.6*  PROT 6.9 6.7  ALBUMIN 4.1 4.0   Recent Labs  Lab 01/22/22 0102 01/22/22 0324  PROCALCITON 1.18  --   LATICACIDVEN 1.6 1.1  No results found for this or any previous visit (from the past 240 hour(s)).  Antimicrobials: Anti-infectives (From admission, onward)    Start     Dose/Rate Route Frequency Ordered Stop   01/22/22 0500  levofloxacin (LEVAQUIN) IVPB 750 mg        750 mg 100 mL/hr over 90 Minutes Intravenous Every 24 hours 01/22/22 0415 01/27/22 0514   01/20/22 0600  vancomycin (VANCOREADY) IVPB 1500 mg/300 mL        1,500 mg 150 mL/hr over 120 Minutes Intravenous On call to O.R. 01/19/22 2314 01/20/22 1747      Culture/Microbiology    Component Value Date/Time   SDES BLOOD RAC 05/28/2021 1439   SPECREQUEST BOTTLES DRAWN AEROBIC AND ANAEROBIC BCAV 05/28/2021 1439   CULT  05/28/2021 1439    NO GROWTH 5 DAYS Performed at [facility], [address] [place], [place] [address]    REPTSTATUS 06/02/2021 FINAL 05/28/2021 1439  Radiology Studies: [facility] Chest Port 1 View  Result Date: 01/21/2022 CLINICAL DATA:  Hypoxia. Shortness of breath. EXAM: PORTABLE CHEST 1 VIEW COMPARISON:  Chest CT 08/18/2021 FINDINGS: Lung volumes are low. Chronic lung disease, emphysema and subpleural reticulation on prior chest CT. There are  ill-defined opacities in the lung bases. Heart is normal in size with normal mediastinal contours. No pleural fluid or pneumothorax. IMPRESSION: 1. Low lung volumes with ill-defined opacities at the lung bases, atelectasis versus pneumonia. 2. Chronic lung disease, emphysema and subpleural reticulation on prior chest CT. Electronically Signed   By: Keith Rake M.D.   On: 01/21/2022 23:51   US RENAL  Result Date: 01/21/2022 CLINICAL DATA:  Acute kidney injury EXAM: RENAL / URINARY TRACT ULTRASOUND COMPLETE COMPARISON:  09/25/2019 FINDINGS: Right Kidney: Renal measurements: 12.4 x 6.0 x 5.0 cm = volume: 192 mL. Echogenicity within normal limits. No mass or hydronephrosis visualized. Left Kidney: Renal measurements: 14.3 x 6.6 x 4.3 cm = volume: 214 mL. Echogenicity within normal limits. No mass or hydronephrosis visualized. Bladder: Appears normal for degree of bladder distention. Other: None. IMPRESSION: Normal renal ultrasound. Similar appearance to the exam of July 2021. Electronically Signed   By: Nelson Chimes M.D.   On: 01/21/2022 15:17     LOS: 2 days   Antonieta Pert, MD [facility]  01/22/2022, 10:18 AM

## 2022-01-22 NOTE — Progress Notes (Signed)
Pharmacy Antibiotic Note  Darren Allen is a 56 y.o. male w/ PMH of COPD, DM, chronic pain, kidney stones, HTN admitted on 01/20/2022 with s/p paraesophageal and umbilical hernia repair now w/ asp PNA.  Pharmacy has been consulted for Zosyn dosing. Renal function improving  Plan: start  Zosyn 3.375g IV q8h (4 hour infusion). ---follow renal function for needed dose adjustments  Height: '6\' 2"'$  (188 cm) Weight: 111.1 kg (245 lb) IBW/kg (Calculated) : 82.2  Temp (24hrs), Avg:98.6 F (37 C), Min:97.8 F (36.6 C), Max:99.7 F (37.6 C)  Recent Labs  Lab 01/20/22 2011 01/21/22 0636 01/21/22 1747 01/22/22 0102 01/22/22 0324 01/22/22 1452  WBC 16.7* 11.5*  --  10.9* 11.4* 12.6*  CREATININE 1.82* 2.54* 1.91* 1.51* 1.41* 1.21  LATICACIDVEN  --   --   --  1.6 1.1 0.8    Estimated Creatinine Clearance: 88.3 mL/min (by C-G formula based on SCr of 1.21 mg/dL).    Allergies  Allergen Reactions   Glipizide Other (See Comments) and Palpitations    Shaky, feel bad Other reaction(s): Dizziness   Cephalexin Rash   Duloxetine Anxiety and Nausea Only    Antimicrobials this admission: 11/09 levofloxacin x 1  11/09 Zosyn >>   Microbiology results: none  Thank you for allowing pharmacy to be a part of this patient's care.  Dallie Piles 01/22/2022 3:52 PM

## 2022-01-22 NOTE — Plan of Care (Signed)

## 2022-01-22 NOTE — Plan of Care (Signed)

## 2022-01-22 NOTE — Progress Notes (Signed)
   01/22/22 1300  Clinical Encounter Type  Visited With Patient and family together  Visit Type Code  Consult/Referral To Chaplain   Chaplain responded to RRT code to provide support to family and patient.

## 2022-01-22 NOTE — Significant Event (Addendum)
Rapid Response Event Note   Reason for Call :  Per Bedside RN- patient's HR ST in 130's all shift. BP 220'U systolic- oxygen 54% on 1 liter of oxygen. Patient alert and oriented - no signs of distress and no complaints of pain. Bedside RN stated that MD Maren Beach believes patient's elevated HR is due to patient's pain.  Initial Focused Assessment:       Interventions:  MD stated he believes patients vitals are due to patient's pain.  He ordered for an EKG and a repeat Hgb.   Plan of Care:   I asked the MD if patient took blood pressure medications at home and he stated yes. I asked if it was possible that patient could have a 2.'5mg'$  dose of IV metoprolol.  MD stated he does not use metoprolol to treat sinus tachycardia and that his blood pressure was stable.    Event Summary:   MD Notified: Dr. Maren Beach Call Time: Arrival Time:13. End Time:14:05  Reggie Pile, RN

## 2022-01-22 NOTE — Hospital Course (Addendum)
56 y.o.m w/ T2DM W/ Peripheral neuropathy,HTN,COPD,CKD stage IIIa, chronic pain on Norco, class I obesity ,OSA on CPAP s/p paraesophageal and umbilical hernia repair, TRH consulted for medical management

## 2022-01-22 NOTE — Progress Notes (Signed)
   01/21/22 2325  Assess: MEWS Score  Pulse Rate (!) 130  Resp (!) 24  Level of Consciousness Alert  O2 Device Nasal Cannula  Patient Activity (if Appropriate) In bed  O2 Flow Rate (L/min) 1.5 L/min  Assess: MEWS Score  MEWS Temp 0  MEWS Systolic 0  MEWS Pulse 3  MEWS RR 1  MEWS LOC 0  MEWS Score 4  MEWS Score Color Red  Assess: if the MEWS score is Yellow or Red  Were vital signs taken at a resting state? Yes  Focused Assessment Change from prior assessment (see assessment flowsheet)  Does the patient meet 2 or more of the SIRS criteria? Yes  Does the patient have a confirmed or suspected source of infection? No  Provider and Rapid Response Notified? No Carlena Hurl NP at bedside)  MEWS guidelines implemented *See Row Information* Yes  Treat  MEWS Interventions Administered prn meds/treatments;Consulted Respiratory Therapy;Administered scheduled meds/treatments (Nebs/CxR ordered/Tele & Cont pulse oximetry ordered)  Pain Scale 0-10  Pain Score 0  Take Vital Signs  Increase Vital Sign Frequency  Red: Q 1hr X 4 then Q 4hr X 4, if remains red, continue Q 4hrs  Escalate  MEWS: Escalate Red: discuss with charge nurse/RN and provider, consider discussing with RRT Carlena Hurl NP at bedside)  Notify: Provider  Provider Name/Title Gerline Legacy NP  Date Provider Notified 01/21/22  Time Provider Notified 2325  Provider response At bedside;See new orders  Date of Provider Response 01/21/22  Time of Provider Response 2325  Assess: SIRS CRITERIA  SIRS Temperature  0  SIRS Pulse 1  SIRS Respirations  1  SIRS WBC 1  SIRS Score Sum  3

## 2022-01-22 NOTE — Progress Notes (Addendum)
Darren Allen Hospital Day(s): 2.   Post op day(s): 2 Days Post-Op.   Interval History:  Patient seen and examined Events overnight noted; tachycardic and dyspneic; CXR with possible atelectasis vs PNA; started on Levaquin  This morning, he feels that his breathing is improved but does have conversational dyspnea, does feel anxiety is contributing to this Abdominal pain limiting ability to allow for deep inspiration and expiration No fever chills, nausea, emesis Early morning labs showed mild leukocytosis to 11.4; relatively stable over last 24 hours  Hgb 9.4 (11.1 --> 10.9 --> 9.5 --> 9.4); suspect there is dilutional component  AKI improving; sCr - 1.41; UO - 2850 ccs  Electrolytes improved; mild hypophosphatemia to 2.2 Lactic acid levels normal; 1.1 Surgical drain with 50 ccs out; serosanguinous He is on FLD; tolerating. No trouble swallowing   Vital signs in last 24 hours: [min-max] current  Temp:  [97.8 F (36.6 C)-99.7 F (37.6 C)] 97.8 F (36.6 C) (11/09 0742) Pulse Rate:  [88-130] 110 (11/09 0742) Resp:  [15-27] 18 (11/09 0742) BP: (104-148)/(72-95) 134/87 (11/09 0742) SpO2:  [88 %-98 %] 96 % (11/09 0742)     Height: '6\' 2"'$  (188 cm) Weight: 111.1 kg BMI (Calculated): 31.44   Intake/Output last 2 shifts:  11/08 0701 - 11/09 0700 In: 4227.3 [P.O.:1200; I.V.:1416.8; IV Piggyback:1605.5] Out: 2885 [Urine:2850; Drains:35]   Physical Exam:  Constitutional: alert, cooperative and no distress  Respiratory: Breathing somewhat labored; conversational dyspnea Cardiovascular: regular rate and sinus rhythm  Gastrointestinal: Soft, incisional soreness, non-distended, no rebound/guarding. Surgical drain in left abdomen; output serosanguinous Integumentary: Laparoscopic and laparotomy incisions are CDI with staples, honeycomb dressings.   Labs:     Latest Ref Rng & Units 01/22/2022    3:24 AM 01/22/2022    1:02 AM 01/21/2022    6:36 AM  CBC   WBC 4.0 - 10.5 K/uL 11.4  10.9  11.5   Hemoglobin 13.0 - 17.0 g/dL 9.4  9.5  10.9   Hematocrit 39.0 - 52.0 % 29.6  29.9  34.3   Platelets 150 - 400 K/uL 170  177  213       Latest Ref Rng & Units 01/22/2022    3:24 AM 01/22/2022    1:02 AM 01/21/2022    5:47 PM  CMP  Glucose 70 - 99 mg/dL 181  173  223   BUN 6 - 20 mg/dL 23  24  32   Creatinine 0.61 - 1.24 mg/dL 1.41  1.51  1.91   Sodium 135 - 145 mmol/L 138  136  136   Potassium 3.5 - 5.1 mmol/L 4.9  4.9  4.3   Chloride 98 - 111 mmol/L 109  107  108   CO2 22 - 32 mmol/L '19  23  22   '$ Calcium 8.9 - 10.3 mg/dL 8.4  8.0  7.5   Total Protein 6.5 - 8.1 g/dL 6.7  6.9    Total Bilirubin 0.3 - 1.2 mg/dL 1.6  1.3    Alkaline Phos 38 - 126 U/L 40  37    AST 15 - 41 U/L 50  53    ALT 0 - 44 U/L 70  73       Imaging studies:   CXR (01/21/2022) personally reviewed and radiologist report reviewed below:  IMPRESSION: 1. Low lung volumes with ill-defined opacities at the lung bases, atelectasis versus pneumonia. 2. Chronic lung disease, emphysema and subpleural reticulation on prior chest CT.   Assessment/Plan:  56 y.o. male 2 Days Post-Op s/p attempted laparoscopic paraesophageal hernia repair converted to open secondary to bleeding from branch of left gastric artery with loose Nissen fundoplication    - Appreciate medicine assistance overnight. Suspect that these respiratory changes are more attributable to atelectasis as he feels he can not take deep breaths/expire given abdominal pain. He also feels quite anxious and this may be exacerbating things. He is using IS (pulling 1500 intermittently). Continue pulmonary toilet. Agree with covering PNA (Levaquin). No evidence of PTX on CXR   - Will advance to soft diet; reviewed dietary recommendations; hand out at bedside   - Will add anxiolytic   - Continue surgical drain; monitor and record output  - Monitor abdominal examination; on-going bowel function   - Monitor leukocytosis  stable  - Monitor renal function; improving  - Pain control prn; antiemetics prn - Okay to mobilize; PT following; recommending HHPT  - Appreciate medicine assistance with comorbidities    - Discharge Planning; Still not ready for DC today; respiratory issues  All of the above findings and recommendations were discussed with the patient, and the medical team, and all of patient's questions were answered to his expressed satisfaction.  -- Edison Simon, PA-C [facility] 01/22/2022, 8:37 AM M-F: 7am - 4pm Pt seen and examined. I agree w [name]. Having some COPD exacerbation, a bit wet. Responded to low dose lasix, added atenolol. He improved. No evidence of surgical complications and no need for reinterventions.

## 2022-01-22 NOTE — Progress Notes (Signed)
   01/22/22 0000  Assess: MEWS Score  Temp 98.4 F (36.9 C)  BP (!) 128/95  MAP (mmHg) 105  Pulse Rate (!) 128  ECG Heart Rate (!) 128  Resp (!) 27  Level of Consciousness Alert  SpO2 97 %  O2 Device Nasal Cannula  Patient Activity (if Appropriate) In bed  O2 Flow Rate (L/min) 3 L/min  Assess: MEWS Score  MEWS Temp 0  MEWS Systolic 0  MEWS Pulse 2  MEWS RR 2  MEWS LOC 0  MEWS Score 4  MEWS Score Color Red  Assess: if the MEWS score is Yellow or Red  Were vital signs taken at a resting state? Yes  Focused Assessment No change from prior assessment  Does the patient meet 2 or more of the SIRS criteria? Yes  Does the patient have a confirmed or suspected source of infection? No  Provider and Rapid Response Notified? No Carlena Hurl NP notified)  MEWS guidelines implemented *See Row Information* Yes  Treat  MEWS Interventions Other (Comment);Administered scheduled meds/treatments (continued assessment/Labs ordered by provider)  Pain Scale 0-10  Pain Score 0  Take Vital Signs  Increase Vital Sign Frequency  Red: Q 1hr X 4 then Q 4hr X 4, if remains red, continue Q 4hrs  Escalate  MEWS: Escalate Red: discuss with charge nurse/RN and provider, consider discussing with RRT Carlena Hurl NP notified)  Notify: Provider  Provider Name/Title Carlena Hurl NP  Date Provider Notified 01/22/22  Time Provider Notified 0001  Method of Notification Call  Provider response See new orders (Labs ordered)  Date of Provider Response 01/22/22  Time of Provider Response 0001  Assess: SIRS CRITERIA  SIRS Temperature  0  SIRS Pulse 1  SIRS Respirations  1  SIRS WBC 1  SIRS Score Sum  3

## 2022-01-23 ENCOUNTER — Inpatient Hospital Stay: Payer: HMO

## 2022-01-23 DIAGNOSIS — Z9889 Other specified postprocedural states: Secondary | ICD-10-CM | POA: Diagnosis not present

## 2022-01-23 DIAGNOSIS — Z8719 Personal history of other diseases of the digestive system: Secondary | ICD-10-CM | POA: Diagnosis not present

## 2022-01-23 LAB — CBC WITH DIFFERENTIAL/PLATELET
Abs Immature Granulocytes: 0.06 10*3/uL (ref 0.00–0.07)
Basophils Absolute: 0 10*3/uL (ref 0.0–0.1)
Basophils Relative: 0 %
Eosinophils Absolute: 0.2 10*3/uL (ref 0.0–0.5)
Eosinophils Relative: 2 %
HCT: 27 % — ABNORMAL LOW (ref 39.0–52.0)
Hemoglobin: 8.5 g/dL — ABNORMAL LOW (ref 13.0–17.0)
Immature Granulocytes: 1 %
Lymphocytes Relative: 10 %
Lymphs Abs: 1 10*3/uL (ref 0.7–4.0)
MCH: 27.3 pg (ref 26.0–34.0)
MCHC: 31.5 g/dL (ref 30.0–36.0)
MCV: 86.8 fL (ref 80.0–100.0)
Monocytes Absolute: 1 10*3/uL (ref 0.1–1.0)
Monocytes Relative: 10 %
Neutro Abs: 7.6 10*3/uL (ref 1.7–7.7)
Neutrophils Relative %: 77 %
Platelets: 180 10*3/uL (ref 150–400)
RBC: 3.11 MIL/uL — ABNORMAL LOW (ref 4.22–5.81)
RDW: 14.7 % (ref 11.5–15.5)
WBC: 9.8 10*3/uL (ref 4.0–10.5)
nRBC: 0 % (ref 0.0–0.2)

## 2022-01-23 LAB — COMPREHENSIVE METABOLIC PANEL
ALT: 48 U/L — ABNORMAL HIGH (ref 0–44)
AST: 28 U/L (ref 15–41)
Albumin: 3.6 g/dL (ref 3.5–5.0)
Alkaline Phosphatase: 41 U/L (ref 38–126)
Anion gap: 8 (ref 5–15)
BUN: 18 mg/dL (ref 6–20)
CO2: 25 mmol/L (ref 22–32)
Calcium: 8.9 mg/dL (ref 8.9–10.3)
Chloride: 106 mmol/L (ref 98–111)
Creatinine, Ser: 1.43 mg/dL — ABNORMAL HIGH (ref 0.61–1.24)
GFR, Estimated: 57 mL/min — ABNORMAL LOW (ref >60)
Glucose, Bld: 141 mg/dL — ABNORMAL HIGH (ref 70–99)
Potassium: 4.2 mmol/L (ref 3.5–5.1)
Sodium: 139 mmol/L (ref 135–145)
Total Bilirubin: 1.1 mg/dL (ref 0.3–1.2)
Total Protein: 7 g/dL (ref 6.5–8.1)

## 2022-01-23 LAB — MAGNESIUM: Magnesium: 2 mg/dL (ref 1.7–2.4)

## 2022-01-23 LAB — PHOSPHORUS: Phosphorus: 2.3 mg/dL — ABNORMAL LOW (ref 2.5–4.6)

## 2022-01-23 LAB — GLUCOSE, CAPILLARY
Glucose-Capillary: 110 mg/dL — ABNORMAL HIGH (ref 70–99)
Glucose-Capillary: 116 mg/dL — ABNORMAL HIGH (ref 70–99)
Glucose-Capillary: 143 mg/dL — ABNORMAL HIGH (ref 70–99)
Glucose-Capillary: 179 mg/dL — ABNORMAL HIGH (ref 70–99)

## 2022-01-23 MED ORDER — SODIUM CHLORIDE 0.9 % IV SOLN: INTRAVENOUS | Status: DC | PRN

## 2022-01-23 MED ORDER — FUROSEMIDE 40 MG PO TABS
40.0000 mg | ORAL_TABLET | Freq: Once | ORAL | Status: AC
  Administered 2022-01-23: 40 mg via ORAL
  Filled 2022-01-23: qty 1

## 2022-01-23 MED ORDER — ATENOLOL 25 MG PO TABS
12.5000 mg | ORAL_TABLET | Freq: Every day | ORAL | Status: DC
  Filled 2022-01-23: qty 0.5

## 2022-01-23 NOTE — Progress Notes (Addendum)
Physical Therapy Treatment Patient Details Name: Darren Allen MRN: [id number] DOB: 10-Jun-1965 Today's Date: 01/23/2022   History of Present Illness Pt is a 56 yo male s/p paraesophageal hernia converted to open. PMH of COPD, sleep apnea, HTN, PVD, DM.    PT Comments    Pt found in bed upon PT entry with c/o abdominal pain upon mobility. Pt bed mobility Mod I. Sit<>Stand with supervision, RW, and VC for RW safety. Pt ambulated 220 ft Mod I, with RW, and one standing rest break due to increased HR (128) that was monitored throughout session and RN notified. Pt would benefit from skilled physical therapy to address the listed deficits (see below) to increase independence with ADLs and function in the home. Current recommendation is HHPT to return pt to PLOF.     Recommendations for follow up therapy are one component of a multi-disciplinary discharge planning process, led by the attending physician.  Recommendations may be updated based on patient status, additional functional criteria and insurance authorization.  Follow Up Recommendations  Home health PT     Assistance Recommended at Discharge PRN  Patient can return home with the following A lot of help with bathing/dressing/bathroom;Assistance with cooking/housework;Assist for transportation;Help with stairs or ramp for entrance   Equipment Recommendations  None recommended by PT    Recommendations for Other Services       Precautions / Restrictions Precautions Precautions: Fall Restrictions Weight Bearing Restrictions: No     Mobility  Bed Mobility Overal bed mobility: Modified Independent Bed Mobility: Rolling, Sidelying to Sit Rolling: Modified independent (Device/Increase time) Sidelying to sit: HOB elevated, Modified independent (Device/Increase time)       General bed mobility comments: no vc required for log roll    Transfers Overall transfer level: Modified independent Equipment used: Rolling walker (2  wheels) Transfers: Sit to/from Stand Sit to Stand: Modified independent (Device/Increase time)           General transfer comment: pain increased with mobility    Ambulation/Gait Ambulation/Gait assistance: Modified independent (Device/Increase time) Gait Distance (Feet): 220 Feet Assistive device: Rolling walker (2 wheels), IV Pole Gait Pattern/deviations: WFL(Within Functional Limits), Step-through pattern       General Gait Details: cued for standing rest break due to elevated HR (128)   Stairs             Wheelchair Mobility    Modified Rankin (Stroke Patients Only)       Balance Overall balance assessment: Needs assistance Sitting-balance support: Feet supported Sitting balance-Leahy Scale: Good     Standing balance support: Bilateral upper extremity supported Standing balance-Leahy Scale: Fair                              Cognition Arousal/Alertness: Awake/alert Behavior During Therapy: WFL for tasks assessed/performed Overall Cognitive Status: Within Functional Limits for tasks assessed                                          Exercises      General Comments        Pertinent Vitals/Pain Pain Assessment Pain Assessment: Faces Faces Pain Scale: Hurts even more Pain Location: abdomen Pain Descriptors / Indicators: Grimacing, Guarding, Moaning Pain Intervention(s): Monitored during session, Limited activity within patient's tolerance    Home Living Family/patient expects to be discharged to:: Private residence  Living Arrangements: Spouse/significant other Available Help at Discharge: Family Type of Home: House Home Access: Stairs to enter;Ramped entrance Entrance Stairs-Rails: Can reach both Entrance Stairs-Number of Steps: 1   Home Layout: One level Home Equipment: Conservation officer, nature (2 wheels);Cane - quad      Prior Function            PT Goals (current goals can now be found in the care plan section)  Acute Rehab PT Goals Patient Stated Goal: to go home PT Goal Formulation: With patient Time For Goal Achievement: 02/04/22 Potential to Achieve Goals: Good Progress towards PT goals: Progressing toward goals    Frequency    Min 2X/week      PT Plan Current plan remains appropriate    Co-evaluation              AM-PAC PT "6 Clicks" Mobility   Outcome Measure  Help needed turning from your back to your side while in a flat bed without using bedrails?: None Help needed moving from lying on your back to sitting on the side of a flat bed without using bedrails?: None Help needed moving to and from a bed to a chair (including a wheelchair)?: None Help needed standing up from a chair using your arms (e.g., wheelchair or bedside chair)?: None Help needed to walk in hospital room?: None Help needed climbing 3-5 steps with a railing? : A Little 6 Click Score: 23    End of Session Equipment Utilized During Treatment: Gait belt Activity Tolerance: Patient tolerated treatment well Patient left: in bed;with call bell/phone within reach Nurse Communication: Mobility status PT Visit Diagnosis: Other abnormalities of gait and mobility (R26.89);History of falling (Z91.81);Muscle weakness (generalized) (M62.81)     Time: 9794-8016 PT Time Calculation (min) (ACUTE ONLY): 21 min  Charges:  $Therapeutic Activity: 8-22 mins                     [employer], SPT  01/23/2022, 10:23 AM

## 2022-01-23 NOTE — Progress Notes (Signed)
   01/23/22 0028  Assess: MEWS Score  Temp 97.8 F (36.6 C)  BP 92/78  MAP (mmHg) 84  Pulse Rate (!) 104  Resp (!) 22  SpO2 97 %  O2 Device Room Air  Assess: MEWS Score  MEWS Temp 0  MEWS Systolic 1  MEWS Pulse 1  MEWS RR 1  MEWS LOC 0  MEWS Score 3  MEWS Score Color Yellow  Assess: if the MEWS score is Yellow or Red  Were vital signs taken at a resting state? Yes  Focused Assessment No change from prior assessment  Does the patient meet 2 or more of the SIRS criteria? Yes  Does the patient have a confirmed or suspected source of infection? No  Provider and Rapid Response Notified? No  MEWS guidelines implemented *See Row Information* No, previously yellow, continue vital signs every 4 hours  Treat  MEWS Interventions Administered scheduled meds/treatments  Pain Scale 0-10  Pain Score 3  Pain Type Acute pain  Pain Location Abdomen  Pain Orientation Mid  Escalate  MEWS: Escalate Yellow: discuss with charge nurse/RN and consider discussing with provider and RRT  Notify: Charge Nurse/RN  Name of Charge Nurse/RN Notified Dawn, RN  Date Charge Nurse/RN Notified 01/23/22  Time Charge Nurse/RN Notified 0035  Document  Patient Outcome Stabilized after interventions;Other (Comment) (Patient coughing when HR went up)  Progress note created (see row info) Yes  Assess: SIRS CRITERIA  SIRS Temperature  0  SIRS Pulse 1  SIRS Respirations  1  SIRS WBC 1  SIRS Score Sum  3

## 2022-01-23 NOTE — Care Management Important Message (Signed)
Important Message  Patient Details  Name: Darren Allen MRN: [id number] Date of Birth: 1965/05/10   Medicare Important Message Given:  Yes  Patient asleep upon time of visit.  Copy of Medicare IM left on beside tray for reference.   [name] 01/23/2022, 10:53 AM

## 2022-01-23 NOTE — Progress Notes (Signed)
PROGRESS NOTE Darren Allen  IWL:[id number] DOB: 01-07-1966 DOA: 01/20/2022 PCP: Jon Billings, NP   Brief Narrative/Hospital Course: 56 y.o.m w/ T2DM W/ Peripheral neuropathy,HTN,COPD,CKD stage IIIa, chronic pain on Norco, class I obesity ,OSA on CPAP s/p paraesophageal and umbilical hernia repair, TRH consulted for medical management  11/9 : Patient had ongoing tachycardia shortness of breath hypoxia> rapid response call extensive work-up done, also given Lasix x1 and atenolol> overall clinically improved  Subjective:  This morning he feels much improved, passing gas tolerating diet no nausea vomiting. Able to take deep breath and abdomen not as much painful On RA this am Afebrile overnight and leukocytosis resolved Although creatinine slightly up> he reports he has been drinking well and voiding  Assessment and Plan: Principal Problem:   S/P repair of paraesophageal hernia Active Problems:   AKI (acute kidney injury) ([place])   Type 2 diabetes mellitus with diabetic neuropathy, without long-term current use of insulin ([facility])   Hyperkalemia   Hypertension   Chronic obstructive pulmonary disease ([facility])   Acute blood loss anemia   Diabetic peripheral neuropathy ([place])   Umbilical hernia without obstruction and without gangrene  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  Tolerating diet, on soft diet, plan as per primary surgery service.  Encourage ambulation PT OT    AKI on CKD 3A  b/l creat 1.1 - 1.2 Mild metabolic acidosis: Creat peaked to 2.5.likely multifactorial etiology due to low BP, insensible loss, periop.  Renal ultrasound unremarkable.  UA WBC 6-10, leukocyte trace protein 30.  Holding home lisinopril and HCTZ.  Creatinine improved with IV fluids.  Overnight slight bump in creatinine but since patient is tolerating well and he did not do well with IV fluids> needing supplemental  oxygen,, received small dose of Lasix 11/9- will hold off on further IV fluids monitor urine output encourage oral intake. Recent Labs    05/28/21 1148 05/29/21 0608 05/30/21 0400 10/23/21 0843 01/20/22 2011 01/21/22 0636 01/21/22 1747 01/22/22 0102 01/22/22 0324 01/22/22 1452 01/23/22 0523  BUN 16  --  19 13  --  33* 32* 24* 23* 17 18  CREATININE 1.15 1.08 1.19 1.22 1.82* 2.54* 1.91* 1.51* 1.41* 1.21 1.43*   Intake/Output Summary (Last 24 hours) at 01/23/2022 1053 Last data filed at 01/23/2022 1032 Gross per 24 hour  Intake 752.26 ml  Output 2000 ml  Net -1247.74 ml    Type 2 diabetes mellitus with diabetic neuropathy without long-term insulin:  follows with Dr. Gabriel Carina at [facility]. PTA Trulicity weekly, Metforrmin 1000 mg BID, and NPH 70/30 52 units in the morning and 56 units in the evening.  Blood sugar remains stable on current regimen of Semglee 25 units nightly, meal coverage 10 units, SSI> continue the same once able to tolerate p.o. well can resume home mix insulin .  Continue current dosing of Neurontin Recent Labs  Lab 01/20/22 2011 01/20/22 2151 01/22/22 1136 01/22/22 1347 01/22/22 1645 01/22/22 2116 01/23/22 0819  GLUCAP  --    < > 127* 157* 167* 196* 110*  HGBA1C 7.2*  --   --   --   --   --   --    < > = values in this interval not displayed.     Mild hyperkalemia got Lokelma.  Resolved Recent Labs  Lab 01/21/22 1747 01/22/22 0102 01/22/22 0324 01/22/22 1452 01/23/22 0523  K 4.3 4.9 4.9 4.7 4.2   Hypomagnesemia hypophosphatemia replete Hypertension:  BP meds stable and controlled, continue holding  acei/HCTZ due to AKI OSA on CPAP: Ordered for the night as tolerated COPD with mild exacerbation with wheezing:wheezing improved on low dose prednisone. Encourage IS < nebs Breo Ellipta/Incruse. Weaned off o2, cont ambulation  Rapid response 11/9 for continuous tachycardia hypoxia shortness of breath abdominal distention Possible  pneumonia: Dyspnea in the setting of OSA/COPD/abdominal pain: Post op abdominal pain limiting lung/diaphragm movement.CXR 11/9 "Diffuse bilateral airspace disease.Low lung volumes."  Procal elevated 1.18, and wbc also up : Being managed with Zosyn empirically.  Chest x-ray this morning with improving diffuse interstitial airspace disease and bilateral pleural effusions.  Follow-up on further Lasix due to AKI.  Maintenance IV fluids.  Continue incentive spirometry, ambulation, CPAP bedtime if tolerates.  Leukocytosis resolved, patient feeling much improved today.  On discharge can switch to Augmentin Recent Labs  Lab 01/21/22 0636 01/22/22 0102 01/22/22 0324 01/22/22 1452 01/23/22 0523  WBC 11.5* 10.9* 11.4* 12.6* 9.8  LATICACIDVEN  --  1.6 1.1 0.8  --   PROCALCITON  --  1.18  --   --   --     ABLA: 600 cc of blood loss during surgery monitor hemoglobin transfuse if less than 7.  Monitor closely Recent Labs  Lab 01/21/22 0636 01/22/22 0102 01/22/22 0324 01/22/22 1452 01/23/22 0523  HGB 10.9* 9.5* 9.4* 9.1* 8.5*  HCT 34.3* 29.9* 29.6* 28.3* 27.0*    Mild transaminitis with Elevated TB 1.3: Monitor LFTs.  Improved TB/AST/ALP, ALT at 48  Class I Obesity:Patient's Body mass index is 31.46 kg/m. : Will benefit with PCP follow-up, weight loss  healthy lifestyle and outpatient sleep evaluation.  DVT prophylaxis: enoxaparin (LOVENOX) injection 40 mg Start: 01/21/22 1000 SCDs Start: 01/20/22 1928 Code Status:   Code Status: Full Code Family Communication: plan of care discussed with patient Discussed with primary surgical service.   Patient status is: Inpatient because of renal failure, dyspnea Level of care: Progressive   Dispo: The patient is from: home            Anticipated disposition: Home Mobility Assessment (last 72 hours)     Mobility Assessment     Row Name 01/23/22 1011 01/23/22 0900 01/22/22 2130 01/22/22 0900 01/21/22 2000   Does patient have an order for bedrest or  is patient medically unstable -- No - Continue assessment No - Continue assessment No - Continue assessment No - Continue assessment   What is the highest level of mobility based on the progressive mobility assessment? Level 5 (Walks with assist in room/hall) - Balance while stepping forward/back and can walk in room with assist - Complete Level 5 (Walks with assist in room/hall) - Balance while stepping forward/back and can walk in room with assist - Complete Level 5 (Walks with assist in room/hall) - Balance while stepping forward/back and can walk in room with assist - Complete Level 5 (Walks with assist in room/hall) - Balance while stepping forward/back and can walk in room with assist - Complete Level 5 (Walks with assist in room/hall) - Balance while stepping forward/back and can walk in room with assist - Complete    Row Name 01/21/22 1915 01/21/22 1556 01/21/22 0900 01/20/22 2050 01/20/22 1800   Does patient have an order for bedrest or is patient medically unstable No - Continue assessment -- No - Continue assessment No - Continue assessment No - Continue assessment   What is the highest level of mobility based on the progressive mobility assessment? Level 5 (Walks with  assist in room/hall) - Balance while stepping forward/back and can walk in room with assist - Complete Level 5 (Walks with assist in room/hall) - Balance while stepping forward/back and can walk in room with assist - Complete Level 5 (Walks with assist in room/hall) - Balance while stepping forward/back and can walk in room with assist - Complete Level 5 (Walks with assist in room/hall) - Balance while stepping forward/back and can walk in room with assist - Complete Level 5 (Walks with assist in room/hall) - Balance while stepping forward/back and can walk in room with assist - Complete             Objective: Vitals last 24 hrs: Vitals:   01/22/22 2031 01/23/22 0028 01/23/22 0355 01/23/22 0817  BP: 102/76 92/78 121/80  132/75  Pulse: (!) 106 (!) 104 98 93  Resp: 20 (!) '22 16 20  '$ Temp: 98 F (36.7 C) 97.8 F (36.6 C) 98.2 F (36.8 C) 98 F (36.7 C)  TempSrc: Oral Oral Oral Oral  SpO2: 93% 97% 97% 95%  Weight:      Height:       Weight change:   Physical Examination: General exam: AA oriented x3, not in distress on room air, weak,older appearing HEENT:Oral mucosa moist, Ear/Nose WNL grossly, dentition normal. Respiratory system: bilaterally clear breath sounds no more wheezing diminished BS, no use of accessory muscle Cardiovascular system: S1 & S2 +, regular rate. Gastrointestinal system: Abdomen soft, tender at surgical site with dressing in place  Nervous System:Alert, awake, moving extremities and grossly nonfocal Extremities: LE ankle edema neg, lower extremities warm Skin: No rashes,no icterus. MSK: Normal muscle bulk,tone, power   Medications reviewed:  Scheduled Meds:  acetaminophen  1,000 mg Oral Q6H   amitriptyline  30 mg Oral QHS   budesonide (PULMICORT) nebulizer solution  0.25 mg Nebulization BID   cyclobenzaprine  10 mg Oral TID   enoxaparin (LOVENOX) injection  40 mg Subcutaneous Q24H   fluticasone  1 spray Each Nare Daily   furosemide  40 mg Oral Once   insulin aspart  0-9 Units Subcutaneous TID WC   insulin aspart  10 Units Subcutaneous TID WC   insulin glargine-yfgn  25 Units Subcutaneous QHS   lidocaine  2 patch Transdermal Q24H   montelukast  10 mg Oral Daily   predniSONE  20 mg Oral Q breakfast   pregabalin  100 mg Oral TID   umeclidinium-vilanterol  1 puff Inhalation Daily   Continuous Infusions:  methocarbamol (ROBAXIN) IV     piperacillin-tazobactam (ZOSYN)  IV 3.375 g (01/23/22 0538)      Diet Order             DIET SOFT Room service appropriate? Yes; Fluid consistency: Thin  Diet effective now                  Intake/Output Summary (Last 24 hours) at 01/23/2022 1050 Last data filed at 01/23/2022 1032 Gross per 24 hour  Intake 752.26 ml  Output  2000 ml  Net -1247.74 ml   Net IO Since Admission: 1,801.47 mL [01/23/22 1050]  Wt Readings from Last 3 Encounters:  01/20/22 111.1 kg  01/14/22 111.6 kg  01/13/22 111.1 kg    Unresulted Labs (From admission, onward)     Start     Ordered   01/27/22 0500  Creatinine, serum  (enoxaparin (LOVENOX)    CrCl >/= 30 ml/min)  Weekly,   TIMED     Comments: while on enoxaparin therapy  01/20/22 1927   01/23/22 0500  Comprehensive metabolic panel  Daily at 5am,   R      01/22/22 0716   01/23/22 0500  CBC with Differential/Platelet  Daily at 5am,   R      01/22/22 1522          Data Reviewed: I have personally reviewed following labs and imaging studies CBC: Recent Labs  Lab 01/21/22 0636 01/22/22 0102 01/22/22 0324 01/22/22 1452 01/23/22 0523  WBC 11.5* 10.9* 11.4* 12.6* 9.8  NEUTROABS  --   --   --   --  7.6  HGB 10.9* 9.5* 9.4* 9.1* 8.5*  HCT 34.3* 29.9* 29.6* 28.3* 27.0*  MCV 87.1 86.2 86.8 85.8 86.8  PLT 213 177 170 177 268   Basic Metabolic Panel: Recent Labs  Lab 01/21/22 0636 01/21/22 1747 01/22/22 0102 01/22/22 0324 01/22/22 1452 01/23/22 0523  NA 137 136 136 138 135 139  K 5.4* 4.3 4.9 4.9 4.7 4.2  CL 109 108 107 109 107 106  CO2 18* 22 23 19* 21* 25  GLUCOSE 195* 223* 173* 181* 187* 141*  BUN 33* 32* 24* 23* 17 18  CREATININE 2.54* 1.91* 1.51* 1.41* 1.21 1.43*  CALCIUM 7.8* 7.5* 8.0* 8.4* 8.4* 8.9  MG 1.6* 1.6* 1.7  --   --  2.0  PHOS  --   --  2.2*  --   --  2.3*   GFR: Estimated Creatinine Clearance: 74.7 mL/min (A) (by C-G formula based on SCr of 1.43 mg/dL (H)). Liver Function Tests: Recent Labs  Lab 01/22/22 0102 01/22/22 0324 01/22/22 1452 01/23/22 0523  AST 53* 50* 39 28  ALT 73* 70* 60* 48*  ALKPHOS 37* 40 40 41  BILITOT 1.3* 1.6* 1.6* 1.1  PROT 6.9 6.7 6.9 7.0  ALBUMIN 4.1 4.0 3.6 3.6   Recent Labs  Lab 01/22/22 0102 01/22/22 0324 01/22/22 1452  PROCALCITON 1.18  --   --   LATICACIDVEN 1.6 1.1 0.8  No results found for  this or any previous visit (from the past 240 hour(s)).  Antimicrobials: Anti-infectives (From admission, onward)    Start     Dose/Rate Route Frequency Ordered Stop   01/22/22 1700  piperacillin-tazobactam (ZOSYN) IVPB 3.375 g        3.375 g 12.5 mL/hr over 240 Minutes Intravenous Every 8 hours 01/22/22 1555     01/22/22 0500  levofloxacin (LEVAQUIN) IVPB 750 mg  Status:  Discontinued        750 mg 100 mL/hr over 90 Minutes Intravenous Every 24 hours 01/22/22 0415 01/22/22 1555   01/20/22 0600  vancomycin (VANCOREADY) IVPB 1500 mg/300 mL        1,500 mg 150 mL/hr over 120 Minutes Intravenous On call to O.R. 01/19/22 2314 01/20/22 1747      Culture/Microbiology    Component Value Date/Time   SDES BLOOD RAC 05/28/2021 1439   SPECREQUEST BOTTLES DRAWN AEROBIC AND ANAEROBIC BCAV 05/28/2021 1439   CULT  05/28/2021 1439    NO GROWTH 5 DAYS Performed at [facility], [address] [place], [place] [address]    REPTSTATUS 06/02/2021 FINAL 05/28/2021 1439  Radiology Studies: [facility] Chest Port 1 View  Result Date: 01/23/2022 CLINICAL DATA:  Shortness of breath. EXAM: PORTABLE CHEST 1 VIEW COMPARISON:  One-view chest x-ray 01/22/2022 FINDINGS: Heart is mildly enlarged. Diffuse interstitial and airspace disease is again seen, improved. Left greater than right pleural effusions are improving. IMPRESSION: Improving diffuse interstitial and airspace disease and bilateral  pleural effusions. Electronically Signed   By: San Morelle M.D.   On: 01/23/2022 06:22   DG Chest Port 1 View  Result Date: 01/22/2022 CLINICAL DATA:  Tachycardia, shortness of breath EXAM: PORTABLE CHEST 1 VIEW COMPARISON:  None Available. FINDINGS: Borderline heart size. Diffuse bilateral airspace disease. Low lung volumes. No definite visible effusions or acute bony abnormality. IMPRESSION: Low lung volumes with diffuse bilateral airspace disease which could reflect edema or infection. Electronically Signed    By: Rolm Baptise M.D.   On: 01/22/2022 14:43   DG Chest Port 1 View  Result Date: 01/21/2022 CLINICAL DATA:  Hypoxia. Shortness of breath. EXAM: PORTABLE CHEST 1 VIEW COMPARISON:  Chest CT 08/18/2021 FINDINGS: Lung volumes are low. Chronic lung disease, emphysema and subpleural reticulation on prior chest CT. There are ill-defined opacities in the lung bases. Heart is normal in size with normal mediastinal contours. No pleural fluid or pneumothorax. IMPRESSION: 1. Low lung volumes with ill-defined opacities at the lung bases, atelectasis versus pneumonia. 2. Chronic lung disease, emphysema and subpleural reticulation on prior chest CT. Electronically Signed   By: Keith Rake M.D.   On: 01/21/2022 23:51   US RENAL  Result Date: 01/21/2022 CLINICAL DATA:  Acute kidney injury EXAM: RENAL / URINARY TRACT ULTRASOUND COMPLETE COMPARISON:  09/25/2019 FINDINGS: Right Kidney: Renal measurements: 12.4 x 6.0 x 5.0 cm = volume: 192 mL. Echogenicity within normal limits. No mass or hydronephrosis visualized. Left Kidney: Renal measurements: 14.3 x 6.6 x 4.3 cm = volume: 214 mL. Echogenicity within normal limits. No mass or hydronephrosis visualized. Bladder: Appears normal for degree of bladder distention. Other: None. IMPRESSION: Normal renal ultrasound. Similar appearance to the exam of July 2021. Electronically Signed   By: Nelson Chimes M.D.   On: 01/21/2022 15:17     LOS: 3 days   Antonieta Pert, MD [facility]  01/23/2022, 10:50 AM

## 2022-01-23 NOTE — Plan of Care (Signed)

## 2022-01-23 NOTE — Discharge Instructions (Signed)
In addition to included general post-operative instructions,  Diet: Recommend following Nissen diet recommendation x4 weeks minimum. You were given a handout for this.   Activity: No heavy lifting >20 pounds (children, pets, laundry, garbage) or strenuous activity for 6 weeks, but light activity and walking are encouraged. Do not drive or drink alcohol if taking narcotic pain medications or having pain that might distract from driving.  Wound care: You may shower/get incision wet with soapy water and pat dry (do not rub incisions), but no baths or submerging incision underwater until follow-up. We will remove staples in follow up.   Medications: Resume all home medications. For mild to moderate pain: acetaminophen (Tylenol) or ibuprofen/naproxen (if no kidney disease). Combining Tylenol with alcohol can substantially increase your risk of causing liver disease. Narcotic pain medications, if prescribed, can be used for severe pain, though may cause nausea, constipation, and drowsiness. Do not combine Tylenol and Percocet (or similar) within a 6 hour period as Percocet (and similar) contain(s) Tylenol. If you do not need the narcotic pain medication, you do not need to fill the prescription.  Call office [phone]) at any time if any questions, worsening pain, fevers/chills, bleeding, drainage from incision site, or other concerns.

## 2022-01-23 NOTE — Progress Notes (Signed)
[place] Hospital Day(s): 3.   Post op day(s): 3 Days Post-Op.   Interval History:  Patient seen and examined Responded well to lasix overnight Hemodynamics improved; normalized  No fever chills, nausea, emesis Leukocytosis normalized; 9.6K Hgb 8.5; suspect this is dilutional Slight elevation in renal function this AM again; sCr - 1.41; UO - 2550 ccs  Mild hypomagnesemia to 2.3 o/w no electrolyte derangements  He is on soft; tolerating. No trouble swallowing   Vital signs in last 24 hours: [min-max] current  Temp:  [97.8 F (36.6 C)-99.3 F (37.4 C)] 98.2 F (36.8 C) (11/10 0355) Pulse Rate:  [98-129] 98 (11/10 0355) Resp:  [16-24] 16 (11/10 0355) BP: (92-140)/(76-87) 121/80 (11/10 0355) SpO2:  [93 %-97 %] 97 % (11/10 0355)     Height: '6\' 2"'$  (188 cm) Weight: 111.1 kg BMI (Calculated): 31.44   Intake/Output last 2 shifts:  11/09 0701 - 11/10 0700 In: 477.2 [P.O.:360; IV Piggyback:117.2] Out: 2600 [Urine:2550; Drains:50]   Physical Exam:  Constitutional: alert, cooperative and no distress  Respiratory: Respiratory status much improved, RR normal, no conversational dyspnea Cardiovascular: regular rate and sinus rhythm  Gastrointestinal: Soft, incisional soreness, non-distended, no rebound/guarding. Surgical drain in left abdomen; output serosanguinous Integumentary: Laparoscopic and laparotomy incisions are CDI with staples, honeycomb dressings.   Labs:     Latest Ref Rng & Units 01/23/2022    5:23 AM 01/22/2022    2:52 PM 01/22/2022    3:24 AM  CBC  WBC 4.0 - 10.5 K/uL 9.8  12.6  11.4   Hemoglobin 13.0 - 17.0 g/dL 8.5  9.1  9.4   Hematocrit 39.0 - 52.0 % 27.0  28.3  29.6   Platelets 150 - 400 K/uL 180  177  170       Latest Ref Rng & Units 01/23/2022    5:23 AM 01/22/2022    2:52 PM 01/22/2022    3:24 AM  CMP  Glucose 70 - 99 mg/dL 141  187  181   BUN 6 - 20 mg/dL '18  17  23   '$ Creatinine 0.61 - 1.24 mg/dL 1.43  1.21   1.41   Sodium 135 - 145 mmol/L 139  135  138   Potassium 3.5 - 5.1 mmol/L 4.2  4.7  4.9   Chloride 98 - 111 mmol/L 106  107  109   CO2 22 - 32 mmol/L '25  21  19   '$ Calcium 8.9 - 10.3 mg/dL 8.9  8.4  8.4   Total Protein 6.5 - 8.1 g/dL 7.0  6.9  6.7   Total Bilirubin 0.3 - 1.2 mg/dL 1.1  1.6  1.6   Alkaline Phos 38 - 126 U/L 41  40  40   AST 15 - 41 U/L 28  39  50   ALT 0 - 44 U/L 48  60  70      Imaging studies:   CXR (01/23/2022) personally reviewed and radiologist report reviewed below:  IMPRESSION: Improving diffuse interstitial and airspace disease and bilateral pleural effusions.   Assessment/Plan:  56 y.o. male 3 Days Post-Op s/p attempted laparoscopic paraesophageal hernia repair converted to open secondary to bleeding from branch of left gastric artery with loose Nissen fundoplication    - Respiratory status and hemodynamics improved this morning. Responded to Lasix. Unfortunately, renal function bump slightly again so will hold off on repeat Dose for now. Discontinued atenolol. Continue pulmonary toilet.    - Continue soft diet; reviewed dietary  recommendations; hand out at bedside  - Continue surgical drain; monitor and record output  - Monitor abdominal examination; on-going bowel function   - Monitor leukocytosis stable; resolved  - Monitor renal function; slight bump likely secondary to lasix  - Pain control prn; antiemetics prn - Okay to mobilize; PT following; recommending HHPT  - Appreciate medicine assistance with comorbidities    - Discharge Planning: Made significant improvement in the last 12 hours, hemodynamics and respiratory status normalized. I think we should keep him at least another 24 hours, but if he continues to do well tomorrow he can go home. Low threshold to keep him longer if any issues.   All of the above findings and recommendations were discussed with the patient, and the medical team, and all of patient's questions were answered to his  expressed satisfaction.  -- Edison Simon, PA-C [facility] 01/23/2022, 7:30 AM M-F: 7am - 4pm

## 2022-01-23 NOTE — TOC Progression Note (Signed)
Transition of Care [facility]) - Progression Note    Patient Details  Name: Darren Allen MRN: [id number] Date of Birth: Aug 17, 1965  Transition [facility] CM/SW [place], LCSW Phone Number: 01/23/2022, 11:20 AM  Clinical Narrative:    Attempted follow up on PT recs. Patient sleeping. TOC will reattempt later.        Expected Discharge Plan and Services                                                 Social Determinants of Health (SDOH) Interventions    Readmission Risk Interventions     No data to display

## 2022-01-23 NOTE — Plan of Care (Signed)

## 2022-01-23 NOTE — Consult Note (Signed)
PULMONOLOGY         Date: 01/23/2022,   MRN# [id number] Darren Allen 1966-01-07     AdmissionWeight: 111.1 kg                 CurrentWeight: 111.1 kg  Referring provider: Otho Ket PA   CHIEF COMPLAINT:   Respiratory distress in context of COPD with post surgical status with paraesophageal hernia.    HISTORY OF PRESENT ILLNESS   [name] is 56 y.o. male  with hx of COPD and LPR with chronic cough with OSA overlap syndrome. Patient has a complex medical history including advanced diabetes with neuropathy, GERD, nephrolithiasis dyslipidemia, morbid obesity chronic pain syndrome hypergonadism as well as asthma/COPD overlap with OSA.  Patient had CT chest Aug 08, 2020 which I reviewed independently with findings of lung nodules which are chronic and stable dating back to 2016.  I reviewed echo from October 2022 with findings of normal LV systolic and RV systolic function trivial regurgitation noted with an LVEF of over 55% and mild MR and TR.  S/p hiatal hernia repair with mild acute exacerbation of COPD.  He walked around hallway.   SPIROMETRIC DATA 07/02/21- FEV1- 2.86L- 68%    PAST MEDICAL HISTORY   Past Medical History:  Diagnosis Date   Allergy    Calculus of kidney 02/04/2015   COPD (chronic obstructive pulmonary disease) (HCC)    Diabetes mellitus without complication ([place])    type 2   Hypertension    Neuropathy      SURGICAL HISTORY   Past Surgical History:  Procedure Laterality Date   APPENDECTOMY     COLONOSCOPY WITH PROPOFOL N/A 12/31/2021   Procedure: COLONOSCOPY WITH PROPOFOL;  Surgeon: Jonathon Bellows, MD;  Location: [facility] ENDOSCOPY;  Service: Gastroenterology;  Laterality: N/A;   ESOPHAGOGASTRODUODENOSCOPY N/A 12/31/2021   Procedure: ESOPHAGOGASTRODUODENOSCOPY (EGD);  Surgeon: Jonathon Bellows, MD;  Location[facility] ENDOSCOPY;  Service: Gastroenterology;  Laterality: N/A;   INCISION AND DRAINAGE PERIRECTAL ABSCESS N/A 02/04/2015    Procedure: IRRIGATION AND DEBRIDEMENT PERIRECTAL ABSCESS;  Surgeon: Marlyce Huge, MD;  Location: [facility] ORS;  Service: General;  Laterality: N/A;   INSERTION OF MESH  01/20/2022   Procedure: INSERTION OF MESH;  Surgeon: Jules Husbands, MD;  Location: [facility] ORS;  Service: General;;   KIDNEY STONE SURGERY Right    lung mass removal N/A    ORIF ANKLE FRACTURE Left 03/23/2017   Procedure: OPEN REDUCTION INTERNAL FIXATION (ORIF) ANKLE FRACTURE;  Surgeon: Leim Fabry, MD;  Location: [facility] ORS;  Service: Orthopedics;  Laterality: Left;   RECTAL EXAM UNDER ANESTHESIA  02/04/2015   Procedure: RECTAL EXAM UNDER ANESTHESIA;  Surgeon: Marlyce Huge, MD;  Location: [facility] ORS;  Service: General;;   SYNDESMOSIS REPAIR Left 03/23/2017   Procedure: SYNDESMOSIS REPAIR;  Surgeon: Leim Fabry, MD;  Location: [facility] ORS;  Service: Orthopedics;  Laterality: Left;   UMBILICAL HERNIA REPAIR  01/20/2022   Procedure: HERNIA REPAIR UMBILICAL ADULT;  Surgeon: Jules Husbands, MD;  Location: [facility] ORS;  Service: General;;   XI ROBOTIC ASSISTED PARAESOPHAGEAL HERNIA REPAIR N/A 01/20/2022   Procedure: XI ROBOTIC ASSISTED PARAESOPHAGEAL HERNIA REPAIR, CONVERTED TO OPEN, RNFA to assist;  Surgeon: Jules Husbands, MD;  Location: [facility] ORS;  Service: General;  Laterality: N/A;     FAMILY HISTORY   Family History  Problem Relation Age of Onset   Cancer Mother 3       Lung   Cancer Father  Colon   Heart disease Father    Alcohol abuse Father    Cancer Brother 68       Esophageal   Diabetes Brother    Heart disease Brother      SOCIAL HISTORY   Social History   Tobacco Use   Smoking status: Every Day    Packs/day: 1.00    Years: 34.00    Total pack years: 34.00    Types: Cigarettes   Smokeless tobacco: Never   Tobacco comments:    patient using Nicoderm patches  Vaping Use   Vaping Use: Never used  Substance Use Topics   Alcohol use: Not Currently    Alcohol/week: 12.0 standard drinks of  alcohol    Types: 12 Cans of beer per week    Comment: Quit February 2023   Drug use: No     MEDICATIONS    Home Medication:    Current Medication:  Current Facility-Administered Medications:    acetaminophen (TYLENOL) tablet 1,000 mg, 1,000 mg, Oral, Q6H, Pabon, Diego F, MD, 1,000 mg at 01/23/22 1219   albuterol (VENTOLIN HFA) 108 (90 Base) MCG/ACT inhaler 1-2 puff, 1-2 puff, Inhalation, Q6H PRN, Pabon, Diego F, MD, 2 puff at 01/21/22 2100   amitriptyline (ELAVIL) tablet 30 mg, 30 mg, Oral, QHS, Pabon, Diego F, MD, 30 mg at 01/22/22 2130   budesonide (PULMICORT) nebulizer solution 0.25 mg, 0.25 mg, Nebulization, BID, Kc, Ramesh, MD, 0.25 mg at 01/23/22 0727   cyclobenzaprine (FLEXERIL) tablet 10 mg, 10 mg, Oral, TID, Pabon, Diego F, MD, 10 mg at 01/23/22 0847   diphenhydrAMINE (BENADRYL) 12.5 MG/5ML elixir 12.5 mg, 12.5 mg, Oral, Q6H PRN **OR** diphenhydrAMINE (BENADRYL) injection 12.5 mg, 12.5 mg, Intravenous, Q6H PRN, Pabon, Diego F, MD   enoxaparin (LOVENOX) injection 40 mg, 40 mg, Subcutaneous, Q24H, Pabon, Diego F, MD, 40 mg at 01/23/22 0847   fluticasone (FLONASE) 50 MCG/ACT nasal spray 1 spray, 1 spray, Each Nare, Daily, Pabon, Diego F, MD, 1 spray at 01/23/22 0848   insulin aspart (novoLOG) injection 0-9 Units, 0-9 Units, Subcutaneous, TID WC, Kc, Ramesh, MD, 2 Units at 01/22/22 1722   insulin aspart (novoLOG) injection 10 Units, 10 Units, Subcutaneous, TID WC, Jose Persia, MD, 10 Units at 01/23/22 0848   insulin glargine-yfgn (SEMGLEE) injection 25 Units, 25 Units, Subcutaneous, QHS, Jose Persia, MD, 25 Units at 01/22/22 2131   ipratropium-albuterol (DUONEB) 0.5-2.5 (3) MG/3ML nebulizer solution 3 mL, 3 mL, Nebulization, Q4H PRN, Sharion Settler, NP, 3 mL at 01/23/22 1242   lidocaine (LIDODERM) 5 % 2 patch, 2 patch, Transdermal, Q24H, Pabon, Diego F, MD, 2 patch at 01/22/22 2131   LORazepam (ATIVAN) tablet 2 mg, 2 mg, Oral, Q6H PRN, Tylene Fantasia, PA-C, 2 mg at  01/22/22 1144   melatonin tablet 5 mg, 5 mg, Oral, QHS PRN, Pabon, Diego F, MD, 5 mg at 01/21/22 2042   methocarbamol (ROBAXIN) tablet 500 mg, 500 mg, Oral, Q8H PRN, 500 mg at 01/22/22 0511 **OR** methocarbamol (ROBAXIN) 500 mg in dextrose 5 % 50 mL IVPB, 500 mg, Intravenous, Q8H PRN, Pabon, Diego F, MD   montelukast (SINGULAIR) tablet 10 mg, 10 mg, Oral, Daily, Pabon, Diego F, MD, 10 mg at 01/23/22 0847   morphine (PF) 4 MG/ML injection 4 mg, 4 mg, Intravenous, Q3H PRN, Pabon, Diego F, MD, 4 mg at 01/21/22 2040   naloxone (NARCAN) nasal spray 4 mg/0.1 mL, 1 spray, Nasal, PRN, Pabon, Diego F, MD   ondansetron (ZOFRAN-ODT) disintegrating tablet 4 mg, 4  mg, Oral, Q6H PRN **OR** ondansetron (ZOFRAN) injection 4 mg, 4 mg, Intravenous, Q6H PRN, Pabon, Diego F, MD   oxyCODONE (Oxy IR/ROXICODONE) immediate release tablet 5-10 mg, 5-10 mg, Oral, Q4H PRN, Pabon, Diego F, MD, 10 mg at 01/23/22 0854   piperacillin-tazobactam (ZOSYN) IVPB 3.375 g, 3.375 g, Intravenous, Q8H, Dallie Piles, RPH, Last Rate: 12.5 mL/hr at 01/23/22 1428, 3.375 g at 01/23/22 1428   predniSONE (DELTASONE) tablet 20 mg, 20 mg, Oral, Q breakfast, Kc, Ramesh, MD, 20 mg at 01/23/22 0847   pregabalin (LYRICA) capsule 100 mg, 100 mg, Oral, TID, Pabon, Diego F, MD, 100 mg at 01/23/22 0847   prochlorperazine (COMPAZINE) tablet 10 mg, 10 mg, Oral, Q6H PRN **OR** prochlorperazine (COMPAZINE) injection 5-10 mg, 5-10 mg, Intravenous, Q6H PRN, Pabon, Diego F, MD   umeclidinium-vilanterol (ANORO ELLIPTA) 62.5-25 MCG/ACT 1 puff, 1 puff, Inhalation, Daily, Kc, Ramesh, MD, 1 puff at 01/23/22 0847    ALLERGIES   Glipizide, Cephalexin, and Duloxetine     REVIEW OF SYSTEMS    Review of Systems:  Gen:  Denies  fever, sweats, chills weigh loss  HEENT: Denies blurred vision, double vision, ear pain, eye pain, hearing loss, nose bleeds, sore throat Cardiac:  No dizziness, chest pain or heaviness, chest tightness,edema Resp:   reports dyspnea  chronically  Gi: Denies swallowing difficulty, stomach pain, nausea or vomiting, diarrhea, constipation, bowel incontinence Gu:  Denies bladder incontinence, burning urine Ext:   Denies Joint pain, stiffness or swelling Skin: Denies  skin rash, easy bruising or bleeding or hives Endoc:  Denies polyuria, polydipsia , polyphagia or weight change Psych:   Denies depression, insomnia or hallucinations   Other:  All other systems negative   VS: BP (!) 122/93 (BP Location: Left Arm)   Pulse (!) 110   Temp 98.5 F (36.9 C) (Oral)   Resp 18   Ht '6\' 2"'$  (1.88 m)   Wt 111.1 kg   SpO2 98%   BMI 31.46 kg/m      PHYSICAL EXAM    GENERAL:NAD, no fevers, chills, no weakness no fatigue HEAD: Normocephalic, atraumatic.  EYES: Pupils equal, round, reactive to light. Extraocular muscles intact. No scleral icterus.  MOUTH: Moist mucosal membrane. Dentition intact. No abscess noted.  EAR, NOSE, THROAT: Clear without exudates. No external lesions.  NECK: Supple. No thyromegaly. No nodules. No JVD.  PULMONARY: decreased breath sounds with mild rhonchi worse at bases bilaterally.  CARDIOVASCULAR: S1 and S2. Regular rate and rhythm. No murmurs, rubs, or gallops. No edema. Pedal pulses 2+ bilaterally.  GASTROINTESTINAL: Soft, nontender, nondistended. No masses. Positive bowel sounds. No hepatosplenomegaly.  MUSCULOSKELETAL: No swelling, clubbing, or edema. Range of motion full in all extremities.  NEUROLOGIC: Cranial nerves II through XII are intact. No gross focal neurological deficits. Sensation intact. Reflexes intact.  SKIN: No ulceration, lesions, rashes, or cyanosis. Skin warm and dry. Turgor intact.  PSYCHIATRIC: Mood, affect within normal limits. The patient is awake, alert and oriented x 3. Insight, judgment intact.       IMAGING   SPIROMETRIC DATA 07/02/21- FEV1- 2.86L- 68%   ASSESSMENT/PLAN   Mild to moderate acute exacerbation of COPD       Prednisone '20mg'$  x 5 days then '10mg'$  x 2  days      - currently on zosyn      - continue incentive spirometry  OSA on cpap   -Hold CPAP for 2 wks while recovering with fresh surgical wounds      Continue PT/OT, patient  is close to baseline may initiate DC planning.        Thank you for allowing me to participate in the care of this patient.   Patient/Family are satisfied with care plan and all questions have been answered.    Provider disclosure: Patient with at least one acute or chronic illness or injury that poses a threat to life or bodily function and is being managed actively during this encounter.  All of the below services have been performed independently by signing provider:  review of prior documentation from internal and or external health records.  Review of previous and current lab results.  Interview and comprehensive assessment during patient visit today. Review of current and previous chest radiographs/CT scans. Discussion of management and test interpretation with health care team and patient/family.   This document was prepared using Dragon voice recognition software and may include unintentional dictation errors.     Ottie Glazier, M.D.  Division of Pulmonary & Critical Care Medicine

## 2022-01-24 DIAGNOSIS — Z8719 Personal history of other diseases of the digestive system: Secondary | ICD-10-CM | POA: Diagnosis not present

## 2022-01-24 DIAGNOSIS — Z9889 Other specified postprocedural states: Secondary | ICD-10-CM | POA: Diagnosis not present

## 2022-01-24 LAB — CBC WITH DIFFERENTIAL/PLATELET
Abs Immature Granulocytes: 0.05 10*3/uL (ref 0.00–0.07)
Basophils Absolute: 0 10*3/uL (ref 0.0–0.1)
Basophils Relative: 0 %
Eosinophils Absolute: 0.4 10*3/uL (ref 0.0–0.5)
Eosinophils Relative: 4 %
HCT: 28.3 % — ABNORMAL LOW (ref 39.0–52.0)
Hemoglobin: 9.2 g/dL — ABNORMAL LOW (ref 13.0–17.0)
Immature Granulocytes: 1 %
Lymphocytes Relative: 14 %
Lymphs Abs: 1.2 10*3/uL (ref 0.7–4.0)
MCH: 27.5 pg (ref 26.0–34.0)
MCHC: 32.5 g/dL (ref 30.0–36.0)
MCV: 84.5 fL (ref 80.0–100.0)
Monocytes Absolute: 1.2 10*3/uL — ABNORMAL HIGH (ref 0.1–1.0)
Monocytes Relative: 13 %
Neutro Abs: 6.1 10*3/uL (ref 1.7–7.7)
Neutrophils Relative %: 68 %
Platelets: 226 10*3/uL (ref 150–400)
RBC: 3.35 MIL/uL — ABNORMAL LOW (ref 4.22–5.81)
RDW: 14.4 % (ref 11.5–15.5)
WBC: 8.9 10*3/uL (ref 4.0–10.5)
nRBC: 0 % (ref 0.0–0.2)

## 2022-01-24 LAB — GLUCOSE, CAPILLARY
Glucose-Capillary: 166 mg/dL — ABNORMAL HIGH (ref 70–99)
Glucose-Capillary: 158 mg/dL — ABNORMAL HIGH (ref 70–99)
Glucose-Capillary: 236 mg/dL — ABNORMAL HIGH (ref 70–99)
Glucose-Capillary: 238 mg/dL — ABNORMAL HIGH (ref 70–99)

## 2022-01-24 LAB — COMPREHENSIVE METABOLIC PANEL
ALT: 40 U/L (ref 0–44)
AST: 23 U/L (ref 15–41)
Albumin: 3.6 g/dL (ref 3.5–5.0)
Alkaline Phosphatase: 44 U/L (ref 38–126)
Anion gap: 11 (ref 5–15)
BUN: 24 mg/dL — ABNORMAL HIGH (ref 6–20)
CO2: 24 mmol/L (ref 22–32)
Calcium: 9.1 mg/dL (ref 8.9–10.3)
Chloride: 103 mmol/L (ref 98–111)
Creatinine, Ser: 1.68 mg/dL — ABNORMAL HIGH (ref 0.61–1.24)
GFR, Estimated: 47 mL/min — ABNORMAL LOW (ref >60)
Glucose, Bld: 160 mg/dL — ABNORMAL HIGH (ref 70–99)
Potassium: 3.6 mmol/L (ref 3.5–5.1)
Sodium: 138 mmol/L (ref 135–145)
Total Bilirubin: 1.3 mg/dL — ABNORMAL HIGH (ref 0.3–1.2)
Total Protein: 7.3 g/dL (ref 6.5–8.1)

## 2022-01-24 MED ORDER — METOPROLOL TARTRATE 5 MG/5ML IV SOLN: 5.0000 mg | INTRAVENOUS | Status: DC | PRN

## 2022-01-24 MED ORDER — SODIUM CHLORIDE 0.9 % IV SOLN: INTRAVENOUS | Status: AC

## 2022-01-24 MED ORDER — HEPARIN SODIUM (PORCINE) 5000 UNIT/ML IJ SOLN
5000.0000 [IU] | Freq: Three times a day (TID) | INTRAMUSCULAR | Status: DC
  Administered 2022-01-25: 5000 [IU] via SUBCUTANEOUS
  Filled 2022-01-24: qty 1

## 2022-01-24 MED ORDER — ORAL CARE MOUTH RINSE: 15.0000 mL | OROMUCOSAL | Status: DC | PRN

## 2022-01-24 MED ORDER — HYDRALAZINE HCL 20 MG/ML IJ SOLN: 10.0000 mg | INTRAMUSCULAR | Status: DC | PRN

## 2022-01-24 MED ORDER — ALPRAZOLAM 0.5 MG PO TABS
0.5000 mg | ORAL_TABLET | Freq: Three times a day (TID) | ORAL | Status: DC
  Administered 2022-01-24 – 2022-01-25 (×3): 0.5 mg via ORAL
  Filled 2022-01-24 (×3): qty 1

## 2022-01-24 NOTE — Progress Notes (Signed)
PROGRESS NOTE    Darren Allen  QQV:[id number] DOB: [birth year] DOA: 01/20/2022 PCP: Jon Billings, NP   Brief Narrative:  56 y.o.m w/ T2DM W/ Peripheral neuropathy,HTN,COPD,CKD stage IIIa, chronic pain on Norco, class I obesity ,OSA on CPAP s/p paraesophageal and umbilical hernia repair, TRH consulted for medical management  11/9 : Patient had ongoing tachycardia shortness of breath hypoxia> rapid response call extensive work-up done, also given Lasix x1 and atenolol> overall clinically improved. Pulm consulted who recommended Steroids, Zosyn and IS.    Assessment & Plan:  Principal Problem:   S/P repair of paraesophageal hernia Active Problems:   AKI (acute kidney injury) ([place])   Type 2 diabetes mellitus with diabetic neuropathy, without long-term current use of insulin (HCC)   Hyperkalemia   Hypertension   Chronic obstructive pulmonary disease (HCC)   Acute blood loss anemia   Diabetic peripheral neuropathy ([place])   Umbilical hernia without obstruction and without gangrene     Paraesophageal hernia:  s/p laparoscopic paraesophageal hernia repair converted to open secondary to bleeding from left gastric artery.  Tolerating diet, on soft diet, plan as per primary surgery service.  Encourage ambulation PT OT    AKI on CKD 3A  b/l creat 1.1 - 1.2 Mild metabolic acidosis: Cr peaked at 2.5. Renal US is neg, UA is ng. Hold Lisinopril and HCTZ. Depending on his po intake, give intermittent IVF and monitor Urine output. Will give IVF now next 24 hrs.     Type 2 diabetes mellitus with diabetic neuropathy with long-term insulin:  follows with Dr. Gabriel Carina at [facility]. PTA Trulicity weekly, Metforrmin 1000 mg BID, and NPH 70/30 52 units in the morning and 56 units in the evening.   ---Blood sugar remains stable on current regimen of Semglee 25 units nightly, meal coverage 10 units, SSI> continue the same once able to tolerate p.o. well can resume home mix insulin .  Continue  current dosing of Neurontin  Mild hyperkalemia got Lokelma.  Resolved  Hypomagnesemia hypophosphatemia replete  Hypertension: BP meds stable and controlled, continue holding  acei/HCTZ due to AKI  OSA on CPAP:  Ordered for the night as tolerated COPD with mild exacerbation with wheezing: wheezing improved on low dose prednisone. Encourage IS < nebs Breo Ellipta/Incruse. Weaned off o2, cont ambulation   Rapid response 11/9 for continuous tachycardia hypoxia shortness of breath abdominal distention Possible pneumonia: Dyspnea in the setting of OSA/COPD/abdominal pain: Concerns of PNA on CXR with slightly elevated ProCal. Currently on Zosyn, with plan to swtich to Augmentin when able to tolerate PO to finish the course. Nebs, IS. Out of bed to chair.    ABLA: 600 cc of blood loss during surgery monitor hemoglobin transfuse if less than 7.  Monitor closely  Mild transaminitis with Elevated TB 1.3: Improved.    Class I Obesity:Patient's Body mass index is 31.46 kg/m. : Will benefit with PCP follow-up, weight loss  healthy lifestyle and outpatient sleep evaluation.    DVT prophylaxis: Lovenox per primary     Subjective: Feels ok, he really wants to go home today.    Examination:  General exam: Appears calm and comfortable  Respiratory system: Clear to auscultation. Respiratory effort normal. Cardiovascular system: S1 & S2 heard, RRR. No JVD, murmurs, rubs, gallops or clicks. No pedal edema. Gastrointestinal system: Abdomen is nondistended, soft and nontender. No organomegaly or masses felt. Normal bowel sounds heard. Central nervous system: Alert and oriented. No focal neurological deficits. Extremities: Symmetric 5 x 5 power. Skin:  No rashes, lesions or ulcers Psychiatry: Judgement and insight appear normal. Mood & affect appropriate.     Objective: Vitals:   01/23/22 1932 01/23/22 2103 01/24/22 0443 01/24/22 0800  BP: 117/74  115/79 101/60  Pulse: (!) 130  (!) 102 (!)  101  Resp: '16  16 17  '$ Temp: 99.5 F (37.5 C)  98.2 F (36.8 C) 98.2 F (36.8 C)  TempSrc:    Oral  SpO2: 96% 96% 97% 96%  Weight:      Height:        Intake/Output Summary (Last 24 hours) at 01/24/2022 0823 Last data filed at 01/24/2022 0443 Gross per 24 hour  Intake 651.74 ml  Output 2050 ml  Net -1398.26 ml   Filed Weights   01/20/22 1008  Weight: 111.1 kg     Data Reviewed:   CBC: Recent Labs  Lab 01/22/22 0102 01/22/22 0324 01/22/22 1452 01/23/22 0523 01/24/22 0542  WBC 10.9* 11.4* 12.6* 9.8 8.9  NEUTROABS  --   --   --  7.6 6.1  HGB 9.5* 9.4* 9.1* 8.5* 9.2*  HCT 29.9* 29.6* 28.3* 27.0* 28.3*  MCV 86.2 86.8 85.8 86.8 84.5  PLT 177 170 177 180 960   Basic Metabolic Panel: Recent Labs  Lab 01/21/22 0636 01/21/22 1747 01/22/22 0102 01/22/22 0324 01/22/22 1452 01/23/22 0523 01/24/22 0542  NA 137 136 136 138 135 139 138  K 5.4* 4.3 4.9 4.9 4.7 4.2 3.6  CL 109 108 107 109 107 106 103  CO2 18* 22 23 19* 21* 25 24  GLUCOSE 195* 223* 173* 181* 187* 141* 160*  BUN 33* 32* 24* 23* 17 18 24*  CREATININE 2.54* 1.91* 1.51* 1.41* 1.21 1.43* 1.68*  CALCIUM 7.8* 7.5* 8.0* 8.4* 8.4* 8.9 9.1  MG 1.6* 1.6* 1.7  --   --  2.0  --   PHOS  --   --  2.2*  --   --  2.3*  --    GFR: Estimated Creatinine Clearance: 63.6 mL/min (A) (by C-G formula based on SCr of 1.68 mg/dL (H)). Liver Function Tests: Recent Labs  Lab 01/22/22 0102 01/22/22 0324 01/22/22 1452 01/23/22 0523 01/24/22 0542  AST 53* 50* 39 28 23  ALT 73* 70* 60* 48* 40  ALKPHOS 37* 40 40 41 44  BILITOT 1.3* 1.6* 1.6* 1.1 1.3*  PROT 6.9 6.7 6.9 7.0 7.3  ALBUMIN 4.1 4.0 3.6 3.6 3.6   No results for input(s): "LIPASE", "AMYLASE" in the last 168 hours. No results for input(s): "AMMONIA" in the last 168 hours. Coagulation Profile: No results for input(s): "INR", "PROTIME" in the last 168 hours. Cardiac Enzymes: No results for input(s): "CKTOTAL", "CKMB", "CKMBINDEX", "TROPONINI" in the last 168  hours. BNP (last 3 results) No results for input(s): "PROBNP" in the last 8760 hours. HbA1C: No results for input(s): "HGBA1C" in the last 72 hours. CBG: Recent Labs  Lab 01/23/22 0819 01/23/22 1157 01/23/22 1645 01/23/22 2059 01/24/22 0806  GLUCAP 110* 116* 143* 179* 166*   Lipid Profile: No results for input(s): "CHOL", "HDL", "LDLCALC", "TRIG", "CHOLHDL", "LDLDIRECT" in the last 72 hours. Thyroid Function Tests: Recent Labs    01/22/22 1452  TSH 0.940   Anemia Panel: No results for input(s): "VITAMINB12", "FOLATE", "FERRITIN", "TIBC", "IRON", "RETICCTPCT" in the last 72 hours. Sepsis Labs: Recent Labs  Lab 01/22/22 0102 01/22/22 0324 01/22/22 1452  PROCALCITON 1.18  --   --   LATICACIDVEN 1.6 1.1 0.8    No results found for this or  any previous visit (from the past 240 hour(s)).       Radiology Studies: DG Chest Port 1 View  Result Date: 01/23/2022 CLINICAL DATA:  Shortness of breath. EXAM: PORTABLE CHEST 1 VIEW COMPARISON:  One-view chest x-ray 01/22/2022 FINDINGS: Heart is mildly enlarged. Diffuse interstitial and airspace disease is again seen, improved. Left greater than right pleural effusions are improving. IMPRESSION: Improving diffuse interstitial and airspace disease and bilateral pleural effusions. Electronically Signed   By: San Morelle M.D.   On: 01/23/2022 06:22   DG Chest Port 1 View  Result Date: 01/22/2022 CLINICAL DATA:  Tachycardia, shortness of breath EXAM: PORTABLE CHEST 1 VIEW COMPARISON:  None Available. FINDINGS: Borderline heart size. Diffuse bilateral airspace disease. Low lung volumes. No definite visible effusions or acute bony abnormality. IMPRESSION: Low lung volumes with diffuse bilateral airspace disease which could reflect edema or infection. Electronically Signed   By: Rolm Baptise M.D.   On: 01/22/2022 14:43        Scheduled Meds:  acetaminophen  1,000 mg Oral Q6H   amitriptyline  30 mg Oral QHS   budesonide  (PULMICORT) nebulizer solution  0.25 mg Nebulization BID   cyclobenzaprine  10 mg Oral TID   enoxaparin (LOVENOX) injection  40 mg Subcutaneous Q24H   fluticasone  1 spray Each Nare Daily   insulin aspart  0-9 Units Subcutaneous TID WC   insulin aspart  10 Units Subcutaneous TID WC   insulin glargine-yfgn  25 Units Subcutaneous QHS   lidocaine  2 patch Transdermal Q24H   montelukast  10 mg Oral Daily   predniSONE  20 mg Oral Q breakfast   pregabalin  100 mg Oral TID   umeclidinium-vilanterol  1 puff Inhalation Daily   Continuous Infusions:  sodium chloride 10 mL/hr at 01/24/22 0519   methocarbamol (ROBAXIN) IV     piperacillin-tazobactam (ZOSYN)  IV 3.375 g (01/24/22 0519)     LOS: 4 days   Time spent= 35 mins    Netta Fodge Arsenio Loader, MD [facility]  If 7PM-7AM, please contact night-coverage  01/24/2022, 8:23 AM

## 2022-01-24 NOTE — Progress Notes (Signed)
01/24/2022  Subjective: Patient is 4 Days Post-Op status post robotic converted to open paraesophageal hernia repair and open umbilical hernia repair.  No acute events overnight.  Patient reports this morning that he feels very well and his breathing is much improved.  Denies any significant abdominal pain.  He is tolerating a diet.  However on his labs today, his creatinine has bumped more and it is 1.68 this morning up from 1.43 yesterday.  Vital signs: Temp:  [98.2 F (36.8 C)-99.5 F (37.5 C)] 98.2 F (36.8 C) (11/11 0800) Pulse Rate:  [101-130] 101 (11/11 0800) Resp:  [16-18] 17 (11/11 0800) BP: (101-122)/(60-93) 101/60 (11/11 0800) SpO2:  [96 %-98 %] 97 % (11/11 0836)   Intake/Output: 11/10 0701 - 11/11 0700 In: 651.7 [P.O.:480; I.V.:21.8; IV Piggyback:149.9] Out: 2450 [Urine:2450] Last BM Date : 01/20/22  Physical Exam: Constitutional: No acute distress Abdomen: Soft, nondistended, appropriate tender to palpation.  Midline incision is clean, dry, intact with staples in place.  Labs:  Recent Labs    01/23/22 0523 01/24/22 0542  WBC 9.8 8.9  HGB 8.5* 9.2*  HCT 27.0* 28.3*  PLT 180 226   Recent Labs    01/23/22 0523 01/24/22 0542  NA 139 138  K 4.2 3.6  CL 106 103  CO2 25 24  GLUCOSE 141* 160*  BUN 18 24*  CREATININE 1.43* 1.68*  CALCIUM 8.9 9.1   No results for input(s): "LABPROT", "INR" in the last 72 hours.  Imaging: No results found.  Assessment/Plan: This is a 56 y.o. male s/p robotic converted to open paraesophageal hernia repair and umbilical hernia repair.  - Continue with soft diet today.  Discussed with patient that given the bump in creatinine to 1.68, it would be better to keep him here today in order to continue monitoring his renal function. - The patient reports that when he has been on steroids for COPD flareups, his creatinine also bumped.  I discussed with patient that typically this is an issue with glucose and not creatinine.  However  the patient was adamant that it was a creatinine diuresis.  I discussed with Dr. Lanney Gins with pulmonary medicine and given that the patient is feeling well, he has opted for discontinuing the steroids as a precaution.  I have also discontinued the Zosyn that he was started on for possible aspiration pneumonia.  We do think this is more of a COPD flareup rather than pneumonia itself. - We will repeat BMP tomorrow and if creatinine is improving, will be able to discharge patient home.   Melvyn Neth, [facility]

## 2022-01-24 NOTE — Progress Notes (Signed)
PULMONOLOGY         Date: 01/24/2022,   MRN# [id number] Darren Allen 06-15-1965     AdmissionWeight: 111.1 kg                 CurrentWeight: 111.1 kg  Referring provider: Otho Ket PA   CHIEF COMPLAINT:   Respiratory distress in context of COPD with post surgical status with paraesophageal hernia.    HISTORY OF PRESENT ILLNESS   Darren Allen is 56 y.o. male  with hx of COPD and LPR with chronic cough with OSA overlap syndrome. Patient has a complex medical history including advanced diabetes with neuropathy, GERD, nephrolithiasis dyslipidemia, morbid obesity chronic pain syndrome hypergonadism as well as asthma/COPD overlap with OSA.  Patient had CT chest Aug 08, 2020 which I reviewed independently with findings of lung nodules which are chronic and stable dating back to 2016.  I reviewed echo from October 2022 with findings of normal LV systolic and RV systolic function trivial regurgitation noted with an LVEF of over 55% and mild MR and TR.  S/p hiatal hernia repair with mild acute exacerbation of COPD.  He walked around hallway.   SPIROMETRIC DATA 07/02/21- FEV1- 2.86L- 68%    01/24/22-  patient is improved, wheezing essentially resolved, he shares "I fell better then I have in along time".  De escalating antimicrobials, steroids and other medications to optimize for dc. He does have hx of chronic EtOH use and shares onset of nervousnees will treat with low dose alprazolam.   PAST MEDICAL HISTORY   Past Medical History:  Diagnosis Date   Allergy    Calculus of kidney 02/04/2015   COPD (chronic obstructive pulmonary disease) (HCC)    Diabetes mellitus without complication ([place])    type 2   Hypertension    Neuropathy      SURGICAL HISTORY   Past Surgical History:  Procedure Laterality Date   APPENDECTOMY     COLONOSCOPY WITH PROPOFOL N/A 12/31/2021   Procedure: COLONOSCOPY WITH PROPOFOL;  Surgeon: Jonathon Bellows, MD;  Location: [facility]  ENDOSCOPY;  Service: Gastroenterology;  Laterality: N/A;   ESOPHAGOGASTRODUODENOSCOPY N/A 12/31/2021   Procedure: ESOPHAGOGASTRODUODENOSCOPY (EGD);  Surgeon: Jonathon Bellows, MD;  Location: [facility] ENDOSCOPY;  Service: Gastroenterology;  Laterality: N/A;   INCISION AND DRAINAGE PERIRECTAL ABSCESS N/A 02/04/2015   Procedure: IRRIGATION AND DEBRIDEMENT PERIRECTAL ABSCESS;  Surgeon: Marlyce Huge, MD;  Location: [facility] ORS;  Service: General;  Laterality: N/A;   INSERTION OF MESH  01/20/2022   Procedure: INSERTION OF MESH;  Surgeon: Jules Husbands, MD;  Location: [facility] ORS;  Service: General;;   KIDNEY STONE SURGERY Right    lung mass removal N/A    ORIF ANKLE FRACTURE Left 03/23/2017   Procedure: OPEN REDUCTION INTERNAL FIXATION (ORIF) ANKLE FRACTURE;  Surgeon: Leim Fabry, MD;  Location: [facility] ORS;  Service: Orthopedics;  Laterality: Left;   RECTAL EXAM UNDER ANESTHESIA  02/04/2015   Procedure: RECTAL EXAM UNDER ANESTHESIA;  Surgeon: Marlyce Huge, MD;  Location: [facility] ORS;  Service: General;;   SYNDESMOSIS REPAIR Left 03/23/2017   Procedure: SYNDESMOSIS REPAIR;  Surgeon: Leim Fabry, MD;  Location: [facility] ORS;  Service: Orthopedics;  Laterality: Left;   UMBILICAL HERNIA REPAIR  01/20/2022   Procedure: HERNIA REPAIR UMBILICAL ADULT;  Surgeon: Jules Husbands, MD;  Location: [facility] ORS;  Service: General;;   XI ROBOTIC ASSISTED PARAESOPHAGEAL HERNIA REPAIR N/A 01/20/2022   Procedure: XI ROBOTIC ASSISTED PARAESOPHAGEAL HERNIA REPAIR, CONVERTED TO OPEN, RNFA to assist;  Surgeon: Jules Husbands, MD;  Location: [facility] ORS;  Service: General;  Laterality: N/A;     FAMILY HISTORY   Family History  Problem Relation Age of Onset   Cancer Mother 34       Lung   Cancer Father        Colon   Heart disease Father    Alcohol abuse Father    Cancer Brother 44       Esophageal   Diabetes Brother    Heart disease Brother      SOCIAL HISTORY   Social History   Tobacco Use   Smoking status: Every  Day    Packs/day: 1.00    Years: 34.00    Total pack years: 34.00    Types: Cigarettes   Smokeless tobacco: Never   Tobacco comments:    patient using Nicoderm patches  Vaping Use   Vaping Use: Never used  Substance Use Topics   Alcohol use: Not Currently    Alcohol/week: 12.0 standard drinks of alcohol    Types: 12 Cans of beer per week    Comment: Quit February 2023   Drug use: No     MEDICATIONS    Home Medication:    Current Medication:  Current Facility-Administered Medications:    0.9 %  sodium chloride infusion, , Intravenous, PRN, Pabon, Diego F, MD, Last Rate: 10 mL/hr at 01/24/22 0519, New Bag at 01/24/22 0519   acetaminophen (TYLENOL) tablet 1,000 mg, 1,000 mg, Oral, Q6H, Pabon, Diego F, MD, 1,000 mg at 01/24/22 0519   albuterol (VENTOLIN HFA) 108 (90 Base) MCG/ACT inhaler 1-2 puff, 1-2 puff, Inhalation, Q6H PRN, Pabon, Diego F, MD, 2 puff at 01/21/22 2100   amitriptyline (ELAVIL) tablet 30 mg, 30 mg, Oral, QHS, Pabon, Diego F, MD, 30 mg at 01/23/22 2120   budesonide (PULMICORT) nebulizer solution 0.25 mg, 0.25 mg, Nebulization, BID, Kc, Ramesh, MD, 0.25 mg at 01/24/22 8841   cyclobenzaprine (FLEXERIL) tablet 10 mg, 10 mg, Oral, TID, Pabon, Diego F, MD, 10 mg at 01/24/22 0849   diphenhydrAMINE (BENADRYL) 12.5 MG/5ML elixir 12.5 mg, 12.5 mg, Oral, Q6H PRN **OR** diphenhydrAMINE (BENADRYL) injection 12.5 mg, 12.5 mg, Intravenous, Q6H PRN, Pabon, Diego F, MD   enoxaparin (LOVENOX) injection 40 mg, 40 mg, Subcutaneous, Q24H, Pabon, Diego F, MD, 40 mg at 01/24/22 0846   fluticasone (FLONASE) 50 MCG/ACT nasal spray 1 spray, 1 spray, Each Nare, Daily, Pabon, Diego F, MD, 1 spray at 01/24/22 0851   hydrALAZINE (APRESOLINE) injection 10 mg, 10 mg, Intravenous, Q4H PRN, Amin, Ankit Chirag, MD   insulin aspart (novoLOG) injection 0-9 Units, 0-9 Units, Subcutaneous, TID WC, Kc, Ramesh, MD, 2 Units at 01/24/22 0845   insulin aspart (novoLOG) injection 10 Units, 10 Units,  Subcutaneous, TID WC, Jose Persia, MD, 10 Units at 01/24/22 0846   insulin glargine-yfgn (SEMGLEE) injection 25 Units, 25 Units, Subcutaneous, QHS, Jose Persia, MD, 25 Units at 01/23/22 2112   ipratropium-albuterol (DUONEB) 0.5-2.5 (3) MG/3ML nebulizer solution 3 mL, 3 mL, Nebulization, Q4H PRN, Sharion Settler, NP, 3 mL at 01/23/22 1242   lidocaine (LIDODERM) 5 % 2 patch, 2 patch, Transdermal, Q24H, Pabon, Diego F, MD, 2 patch at 01/23/22 2108   LORazepam (ATIVAN) tablet 2 mg, 2 mg, Oral, Q6H PRN, Tylene Fantasia, PA-C, 2 mg at 01/22/22 1144   melatonin tablet 5 mg, 5 mg, Oral, QHS PRN, Pabon, Diego F, MD, 5 mg at 01/23/22 2115   methocarbamol (ROBAXIN) tablet 500 mg, 500 mg, Oral,  Q8H PRN, 500 mg at 01/22/22 0511 **OR** methocarbamol (ROBAXIN) 500 mg in dextrose 5 % 50 mL IVPB, 500 mg, Intravenous, Q8H PRN, Pabon, Diego F, MD   metoprolol tartrate (LOPRESSOR) injection 5 mg, 5 mg, Intravenous, Q4H PRN, Amin, Ankit Chirag, MD   montelukast (SINGULAIR) tablet 10 mg, 10 mg, Oral, Daily, Pabon, Diego F, MD, 10 mg at 01/24/22 0850   morphine (PF) 4 MG/ML injection 4 mg, 4 mg, Intravenous, Q3H PRN, Pabon, Diego F, MD, 4 mg at 01/21/22 2040   naloxone (NARCAN) nasal spray 4 mg/0.1 mL, 1 spray, Nasal, PRN, Pabon, Diego F, MD   ondansetron (ZOFRAN-ODT) disintegrating tablet 4 mg, 4 mg, Oral, Q6H PRN **OR** ondansetron (ZOFRAN) injection 4 mg, 4 mg, Intravenous, Q6H PRN, Pabon, Diego F, MD   oxyCODONE (Oxy IR/ROXICODONE) immediate release tablet 5-10 mg, 5-10 mg, Oral, Q4H PRN, Pabon, Diego F, MD, 10 mg at 01/24/22 0524   piperacillin-tazobactam (ZOSYN) IVPB 3.375 g, 3.375 g, Intravenous, Q8H, Dallie Piles, RPH, Last Rate: 12.5 mL/hr at 01/24/22 0519, 3.375 g at 01/24/22 0519   predniSONE (DELTASONE) tablet 20 mg, 20 mg, Oral, Q breakfast, Kc, Ramesh, MD, 20 mg at 01/24/22 0849   pregabalin (LYRICA) capsule 100 mg, 100 mg, Oral, TID, Pabon, Diego F, MD, 100 mg at 01/24/22 0849    prochlorperazine (COMPAZINE) tablet 10 mg, 10 mg, Oral, Q6H PRN **OR** prochlorperazine (COMPAZINE) injection 5-10 mg, 5-10 mg, Intravenous, Q6H PRN, Pabon, Diego F, MD   umeclidinium-vilanterol (ANORO ELLIPTA) 62.5-25 MCG/ACT 1 puff, 1 puff, Inhalation, Daily, Kc, Ramesh, MD, 1 puff at 01/24/22 0853    ALLERGIES   Glipizide, Cephalexin, and Duloxetine     REVIEW OF SYSTEMS    Review of Systems:  Gen:  Denies  fever, sweats, chills weigh loss  HEENT: Denies blurred vision, double vision, ear pain, eye pain, hearing loss, nose bleeds, sore throat Cardiac:  No dizziness, chest pain or heaviness, chest tightness,edema Resp:   reports dyspnea chronically  Gi: Denies swallowing difficulty, stomach pain, nausea or vomiting, diarrhea, constipation, bowel incontinence Gu:  Denies bladder incontinence, burning urine Ext:   Denies Joint pain, stiffness or swelling Skin: Denies  skin rash, easy bruising or bleeding or hives Endoc:  Denies polyuria, polydipsia , polyphagia or weight change Psych:   Denies depression, insomnia or hallucinations   Other:  All other systems negative   VS: BP 101/60 (BP Location: Right Arm)   Pulse (!) 101   Temp 98.2 F (36.8 C) (Oral)   Resp 17   Ht '6\' 2"'$  (1.88 m)   Wt 111.1 kg   SpO2 97%   BMI 31.46 kg/m      PHYSICAL EXAM    GENERAL:NAD, no fevers, chills, no weakness no fatigue HEAD: Normocephalic, atraumatic.  EYES: Pupils equal, round, reactive to light. Extraocular muscles intact. No scleral icterus.  MOUTH: Moist mucosal membrane. Dentition intact. No abscess noted.  EAR, NOSE, THROAT: Clear without exudates. No external lesions.  NECK: Supple. No thyromegaly. No nodules. No JVD.  PULMONARY: decreased breath sounds with mild rhonchi worse at bases bilaterally.  CARDIOVASCULAR: S1 and S2. Regular rate and rhythm. No murmurs, rubs, or gallops. No edema. Pedal pulses 2+ bilaterally.  GASTROINTESTINAL: Soft, nontender, nondistended. No  masses. Positive bowel sounds. No hepatosplenomegaly.  MUSCULOSKELETAL: No swelling, clubbing, or edema. Range of motion full in all extremities.  NEUROLOGIC: Cranial nerves II through XII are intact. No gross focal neurological deficits. Sensation intact. Reflexes intact.  SKIN: No ulceration, lesions, rashes,  or cyanosis. Skin warm and dry. Turgor intact.  PSYCHIATRIC: Mood, affect within normal limits. The patient is awake, alert and oriented x 3. Insight, judgment intact.       IMAGING   SPIROMETRIC DATA 07/02/21- FEV1- 2.86L- 68%   ASSESSMENT/PLAN   Mild to moderate acute exacerbation of COPD       Will dc antimicrobials and prednisone      - continue incentive spirometry  Chronic EtOH      Patient expresses nervousness - will treat with low dose xanax   OSA on cpap   -Hold CPAP for 2 wks while recovering with fresh surgical wounds      S/p paraesophageal hernia repair - recovering well            Surgery on case - Dr Hampton Abbot covering today - discussed case in detail                      Plan for possible dc home in am post improvement in renal function.   Continue PT/OT, patient is close to baseline may initiate DC planning.        Thank you for allowing me to participate in the care of this patient.   Patient/Family are satisfied with care plan and all questions have been answered.    Provider disclosure: Patient with at least one acute or chronic illness or injury that poses a threat to life or bodily function and is being managed actively during this encounter.  All of the below services have been performed independently by signing provider:  review of prior documentation from internal and or external health records.  Review of previous and current lab results.  Interview and comprehensive assessment during patient visit today. Review of current and previous chest radiographs/CT scans. Discussion of management and test interpretation with health care team and  patient/family.   This document was prepared using Dragon voice recognition software and may include unintentional dictation errors.     Ottie Glazier, M.D.  Division of Pulmonary & Critical Care Medicine

## 2022-01-25 DIAGNOSIS — Z8719 Personal history of other diseases of the digestive system: Secondary | ICD-10-CM | POA: Diagnosis not present

## 2022-01-25 DIAGNOSIS — Z9889 Other specified postprocedural states: Secondary | ICD-10-CM | POA: Diagnosis not present

## 2022-01-25 LAB — COMPREHENSIVE METABOLIC PANEL
ALT: 39 U/L (ref 0–44)
AST: 25 U/L (ref 15–41)
Albumin: 3.4 g/dL — ABNORMAL LOW (ref 3.5–5.0)
Alkaline Phosphatase: 46 U/L (ref 38–126)
Anion gap: 6 (ref 5–15)
BUN: 25 mg/dL — ABNORMAL HIGH (ref 6–20)
CO2: 26 mmol/L (ref 22–32)
Calcium: 9.4 mg/dL (ref 8.9–10.3)
Chloride: 107 mmol/L (ref 98–111)
Creatinine, Ser: 1.27 mg/dL — ABNORMAL HIGH (ref 0.61–1.24)
GFR, Estimated: >60 mL/min (ref >60)
Glucose, Bld: 173 mg/dL — ABNORMAL HIGH (ref 70–99)
Potassium: 3.9 mmol/L (ref 3.5–5.1)
Sodium: 139 mmol/L (ref 135–145)
Total Bilirubin: 0.5 mg/dL (ref 0.3–1.2)
Total Protein: 7 g/dL (ref 6.5–8.1)

## 2022-01-25 LAB — GLUCOSE, CAPILLARY
Glucose-Capillary: 146 mg/dL — ABNORMAL HIGH (ref 70–99)
Glucose-Capillary: 131 mg/dL — ABNORMAL HIGH (ref 70–99)

## 2022-01-25 LAB — MAGNESIUM: Magnesium: 1.8 mg/dL (ref 1.7–2.4)

## 2022-01-25 MED ORDER — OXYCODONE HCL 5 MG PO TABS: 5.0000 mg | ORAL_TABLET | ORAL | 0 refills | Status: DC | PRN

## 2022-01-25 NOTE — Progress Notes (Signed)
PROGRESS NOTE    Darren Allen  ONG:[id number] DOB: [birth year] DOA: 01/20/2022 PCP: Jon Billings, NP   Brief Narrative:  56 y.o.m w/ T2DM W/ Peripheral neuropathy,HTN,COPD,CKD stage IIIa, chronic pain on Norco, class I obesity ,OSA on CPAP s/p paraesophageal and umbilical hernia repair, TRH consulted for medical management  11/9 : Patient had ongoing tachycardia shortness of breath hypoxia> rapid response call extensive work-up done, also given Lasix x1 and atenolol> overall clinically improved. Pulm consulted who recommended Steroids, Zosyn and IS.    Assessment & Plan:  Principal Problem:   S/P repair of paraesophageal hernia Active Problems:   AKI (acute kidney injury) ([place])   Type 2 diabetes mellitus with diabetic neuropathy, without long-term current use of insulin (HCC)   Hyperkalemia   Hypertension   Chronic obstructive pulmonary disease (HCC)   Acute blood loss anemia   Diabetic peripheral neuropathy ([place])   Umbilical hernia without obstruction and without gangrene     Paraesophageal hernia:  s/p laparoscopic paraesophageal hernia repair converted to open secondary to bleeding from left gastric artery.  Tolerating diet, on soft diet, plan as per primary surgery service.  Encourage ambulation PT OT    AKI on CKD 3A  b/l creat 1.1 - 1.2 Mild metabolic acidosis: Cr peaked at 2.5. Renal US is neg, UA is ng. Cr at baseline of 1.2. Encourage PO intake at home.     Type 2 diabetes mellitus with diabetic neuropathy with long-term insulin:  follows with Dr. Gabriel Carina at [facility]. PTA Trulicity weekly, Metforrmin 1000 mg BID, and NPH 70/30 52 units in the morning and 56 units in the evening.   --Resume home regimen. Advised to follow up outpatient Endocrine. Educated on how to manage it at home.   Mild hyperkalemia got Lokelma.  Resolved  Hypomagnesemia hypophosphatemia replete  Hypertension: Resume home meds.   OSA on CPAP:  Ordered for the night as  tolerated COPD with mild exacerbation with wheezing: wheezing improved on low dose prednisone. Encourage IS < nebs Breo Ellipta/Incruse. Weaned off o2, cont ambulation   Rapid response 11/9 for continuous tachycardia hypoxia shortness of breath abdominal distention Possible pneumonia: Dyspnea in the setting of OSA/COPD/abdominal pain: Concerns of PNA on CXR with slightly elevated ProCal. Nebs, IS. Out of bed to chair.    ABLA: 600 cc of blood loss during surgery monitor hemoglobin transfuse if less than 7.  Monitor closely  Mild transaminitis with Elevated TB 1.3: Improved.    Class I Obesity:Patient's Body mass index is 31.46 kg/m. : Will benefit with PCP follow-up, weight loss  healthy lifestyle and outpatient sleep evaluation.    Cleared for dc.   Subjective: Feels ok, wants to go home.   Examination:  Constitutional: Not in acute distress Respiratory: Clear to auscultation bilaterally Cardiovascular: Normal sinus rhythm, no rubs Abdomen: Nontender nondistended good bowel sounds Musculoskeletal: No edema noted Skin: No rashes seen Neurologic: CN 2-12 grossly intact.  And nonfocal Psychiatric: Normal judgment and insight. Alert and oriented x 3. Normal mood.     Objective: Vitals:   01/24/22 2109 01/25/22 0517 01/25/22 0743 01/25/22 0820  BP:  111/63 125/77   Pulse:  77 79   Resp:   16   Temp:  97.6 F (36.4 C) (!) 97.5 F (36.4 C)   TempSrc:  Oral Oral   SpO2: 99% 98% 99% 100%  Weight:      Height:        Intake/Output Summary (Last 24 hours) at 01/25/2022 1114 Last  data filed at 01/25/2022 1017 Gross per 24 hour  Intake 1309.02 ml  Output --  Net 1309.02 ml   Filed Weights   01/20/22 1008  Weight: 111.1 kg     Data Reviewed:   CBC: Recent Labs  Lab 01/22/22 0102 01/22/22 0324 01/22/22 1452 01/23/22 0523 01/24/22 0542  WBC 10.9* 11.4* 12.6* 9.8 8.9  NEUTROABS  --   --   --  7.6 6.1  HGB 9.5* 9.4* 9.1* 8.5* 9.2*  HCT 29.9* 29.6* 28.3*  27.0* 28.3*  MCV 86.2 86.8 85.8 86.8 84.5  PLT 177 170 177 180 700   Basic Metabolic Panel: Recent Labs  Lab 01/21/22 0636 01/21/22 1747 01/22/22 0102 01/22/22 0324 01/22/22 1452 01/23/22 0523 01/24/22 0542 01/25/22 0534  NA 137 136 136 138 135 139 138 139  K 5.4* 4.3 4.9 4.9 4.7 4.2 3.6 3.9  CL 109 108 107 109 107 106 103 107  CO2 18* 22 23 19* 21* '25 24 26  '$ GLUCOSE 195* 223* 173* 181* 187* 141* 160* 173*  BUN 33* 32* 24* 23* 17 18 24* 25*  CREATININE 2.54* 1.91* 1.51* 1.41* 1.21 1.43* 1.68* 1.27*  CALCIUM 7.8* 7.5* 8.0* 8.4* 8.4* 8.9 9.1 9.4  MG 1.6* 1.6* 1.7  --   --  2.0  --  1.8  PHOS  --   --  2.2*  --   --  2.3*  --   --    GFR: Estimated Creatinine Clearance: 84.1 mL/min (A) (by C-G formula based on SCr of 1.27 mg/dL (H)). Liver Function Tests: Recent Labs  Lab 01/22/22 0324 01/22/22 1452 01/23/22 0523 01/24/22 0542 01/25/22 0534  AST 50* 39 '28 23 25  '$ ALT 70* 60* 48* 40 39  ALKPHOS 40 40 41 44 46  BILITOT 1.6* 1.6* 1.1 1.3* 0.5  PROT 6.7 6.9 7.0 7.3 7.0  ALBUMIN 4.0 3.6 3.6 3.6 3.4*   No results for input(s): "LIPASE", "AMYLASE" in the last 168 hours. No results for input(s): "AMMONIA" in the last 168 hours. Coagulation Profile: No results for input(s): "INR", "PROTIME" in the last 168 hours. Cardiac Enzymes: No results for input(s): "CKTOTAL", "CKMB", "CKMBINDEX", "TROPONINI" in the last 168 hours. BNP (last 3 results) No results for input(s): "PROBNP" in the last 8760 hours. HbA1C: No results for input(s): "HGBA1C" in the last 72 hours. CBG: Recent Labs  Lab 01/24/22 0806 01/24/22 1105 01/24/22 1643 01/24/22 2108 01/25/22 0758  GLUCAP 166* 158* 236* 238* 146*   Lipid Profile: No results for input(s): "CHOL", "HDL", "LDLCALC", "TRIG", "CHOLHDL", "LDLDIRECT" in the last 72 hours. Thyroid Function Tests: Recent Labs    01/22/22 1452  TSH 0.940   Anemia Panel: No results for input(s): "VITAMINB12", "FOLATE", "FERRITIN", "TIBC", "IRON",  "RETICCTPCT" in the last 72 hours. Sepsis Labs: Recent Labs  Lab 01/22/22 0102 01/22/22 0324 01/22/22 1452  PROCALCITON 1.18  --   --   LATICACIDVEN 1.6 1.1 0.8    No results found for this or any previous visit (from the past 240 hour(s)).       Radiology Studies: No results found.      Scheduled Meds:  acetaminophen  1,000 mg Oral Q6H   ALPRAZolam  0.5 mg Oral TID   amitriptyline  30 mg Oral QHS   budesonide (PULMICORT) nebulizer solution  0.25 mg Nebulization BID   cyclobenzaprine  10 mg Oral TID   fluticasone  1 spray Each Nare Daily   heparin  5,000 Units Subcutaneous Q8H   insulin aspart  0-9 Units Subcutaneous TID WC   insulin aspart  10 Units Subcutaneous TID WC   insulin glargine-yfgn  25 Units Subcutaneous QHS   lidocaine  2 patch Transdermal Q24H   montelukast  10 mg Oral Daily   pregabalin  100 mg Oral TID   umeclidinium-vilanterol  1 puff Inhalation Daily   Continuous Infusions:  sodium chloride Stopped (01/24/22 1027)   sodium chloride 75 mL/hr at 01/24/22 2352     LOS: 5 days   Time spent= 35 mins    Tomasina Keasling Arsenio Loader, MD [facility]  If 7PM-7AM, please contact night-coverage  01/25/2022, 11:14 AM

## 2022-01-25 NOTE — Progress Notes (Signed)
Patient requested his pain medication prescription be rerouted to the [facility] on [address] for pick up today.  This was completed.

## 2022-01-25 NOTE — TOC Transition Note (Signed)
Transition of Care [facility]) - CM/SW Discharge Note   Patient Details  Name: Darren Allen MRN: [id number] Date of Birth: 1965-09-12  [facility] CM/SW Contact:  Candie Chroman, LCSW Phone Number: 01/25/2022, 11:10 AM   Clinical Narrative:  Patient has orders to discharge home today. Set up with [facility] for home health PT. Patient is aware and agreeable. No further concerns. CSW signing off.   Final next level of care: [place] Barriers to Discharge: No Barriers Identified   Patient Goals and CMS Choice     Choice offered to / list presented to : Patient  Discharge Placement                Patient to be transferred to facility by: Girlfriend   Patient and family notified of of transfer: 01/25/22  Discharge Plan and Services     Post Acute Care Choice: [place]: PT [place]: [place] Date [place]: 01/25/22   Representative spoke with at [place]: Adela Lank  Social Determinants of Health ([place]) Interventions     Readmission Risk Interventions    01/25/2022   10:26 AM  Readmission Risk Prevention Plan  Transportation Screening Complete  PCP or Specialist Appt within 3-5 Days Complete  HRI or [place] Complete  Social Work Consult for [place] Planning/Counseling Complete  Palliative Care Screening Not Applicable  Medication Review [employer]) Complete

## 2022-01-25 NOTE — TOC Initial Note (Addendum)
Transition of Care [facility]) - Initial/Assessment Note    Patient Details  Name: Darren Allen MRN: [id number] Date of Birth: 13-Jan-1966  Transition of Care [facility]) CM/SW Contact:    Candie Chroman, LCSW Phone Number: 01/25/2022, 10:28 AM  Clinical Narrative:  Readmission prevention screen complete. CSW met with patient. No supports at bedside. CSW introduced role and explained that discharge planning would be discussed. PCP is Jon Billings, NP. Patient drives himself to appointments. Pharmacy is [facility] on [address]. No issues obtaining medications. No home health prior to admission but he is agreeable to recommendations for HHPT. [facility] is first preference. They are reviewing referral. If they cannot accept will check with other local agencies. Patient has a RW and cane at home that he can use prn. He also has a CPAP and nebulizer machine. No further concerns.                 11:00 am: [facility] unable to accept. [facility] answering service will have appropriate on-call liaison call back to see if they can accept referral.  Expected Discharge Plan: [place] Barriers to Discharge: Continued Medical Work up   Patient Goals and CMS Choice        Expected Discharge Plan and Services Expected Discharge Plan: [place] Acute Care Choice: [place] arrangements for the past 2 months: Single Family Home Expected Discharge Date: 01/25/22                                    Prior Living Arrangements/Services Living arrangements for the past 2 months: Single Family Home Lives with:: Significant Other Patient language and need for interpreter reviewed:: Yes Do you feel safe going back to the place where you live?: Yes      Need for Family Participation in Patient Care: Yes (Comment) Care giver support system in place?: Yes (comment)   Criminal Activity/Legal Involvement Pertinent to Current  Situation/Hospitalization: No - Comment as needed  Activities of Daily Living Home Assistive Devices/Equipment: None ADL Screening (condition at time of admission) Patient's cognitive ability adequate to safely complete daily activities?: Yes Is the patient deaf or have difficulty hearing?: No Does the patient have difficulty seeing, even when wearing glasses/contacts?: No Does the patient have difficulty concentrating, remembering, or making decisions?: No Patient able to express need for assistance with ADLs?: Yes Does the patient have difficulty dressing or bathing?: No Independently performs ADLs?: Yes (appropriate for developmental age) Does the patient have difficulty walking or climbing stairs?: No Weakness of Legs: None Weakness of Arms/Hands: None  Permission Sought/Granted Permission sought to share information with : Facility Art therapist granted to share information with : Yes, Verbal Permission Granted     Permission granted to share info w AGENCY: Home Health Agencies        Emotional Assessment Appearance:: Appears stated age Attitude/Demeanor/Rapport: Engaged, Gracious Affect (typically observed): Accepting, Appropriate, Calm, Pleasant Orientation: : Oriented to Self, Oriented to Place, Oriented to  Time, Oriented to Situation Alcohol / Substance Use: Not Applicable Psych Involvement: No (comment)  Admission diagnosis:  S/P repair of paraesophageal hernia [D53.299, Z87.19] Patient Active Problem List   Diagnosis Date Noted   AKI (acute kidney injury) ([place]) 01/21/2022   Hyperkalemia 01/21/2022   Acute blood loss anemia 01/21/2022   S/P repair of paraesophageal hernia 24/26/8341   Umbilical  hernia without obstruction and without gangrene 01/20/2022   Hiatal hernia    Colon cancer screening    Adenomatous polyp of colon    Abnormal MRI, lumbar spine (03/11/2020) 10/29/2021   DDD (degenerative disc disease), lumbosacral 10/29/2021    Lumbosacral lateral recess stenosis (Left: L5-S1) 10/29/2021   Elevated C-reactive protein (CRP) 09/18/2021   Chronic pain syndrome 09/08/2021   Pharmacologic therapy 09/08/2021   Disorder of skeletal system 09/08/2021   Problems influencing health status 09/08/2021   Lumbar facet syndrome 09/08/2021   Lumbosacral radiculopathy at S1 (Left) 09/08/2021   Chronic feet pain (2ry area of Pain) (Bilateral) 09/08/2021   Chronic ankle pain (Left) 09/08/2021   Abnormal NCS (nerve conduction studies) (02/20/2020) 09/08/2021   Neuropathy 06/19/2021   Gout 05/28/2021   PVD (peripheral vascular disease) ([place]) 05/28/2021   Hypertension    Chronic ankle pain (Bilateral) 07/25/2020   Chronic low back pain (1ry area of Pain) (Bilateral) (R>L) w/o sciatica 07/25/2020   Diabetic peripheral neuropathy ([place]) 12/30/2019   Other acquired hammer toe 11/28/2019   Ankle fracture 03/23/2017   Type 2 diabetes mellitus with diabetic neuropathy, without long-term current use of insulin ([place]) 04/16/2016   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   Calculus of kidney 02/04/2015   Perianal abscess 02/04/2015   Perirectal abscess    Chronic obstructive pulmonary disease ([place]) 01/17/2013   Acid reflux 01/17/2013   HLD (hyperlipidemia) 01/17/2013   Apnea, sleep 01/17/2013   Testicular hypofunction 01/17/2013   Allergic state 01/17/2013   Adiposity 07/06/2011   PCP:  Jon Billings, NP Pharmacy:   [facility] STORE [place], [place] AT [facility] [address] [place] [address] Phone: [phone] Fax: [phone]  [facility] #[id number] [place], [place] - [address] AT [facility] OF [address] & [address] [place] [address] Phone: [phone] Fax: [phone] [place], [place] - [address] [place] [address] Phone: [phone] Fax: [phone]     Social Determinants of Health ([place]) Interventions     Readmission Risk Interventions    01/25/2022   10:26 AM  Readmission Risk Prevention Plan  Transportation Screening Complete  PCP or Specialist Appt within 3-5 Days Complete  HRI or Home Care Consult Complete  Social Work Consult for [place] Planning/Counseling Complete  Palliative Care Screening Not Applicable  Medication Review Press photographer) Complete

## 2022-01-25 NOTE — Discharge Summary (Signed)
Patient ID: Darren Allen MRN: [id number] DOB/AGE: 58-Apr-1967 58 y.o.  Admit date: 01/20/2022 Discharge date: 01/25/2022   Discharge Diagnoses:  Principal Problem:   S/P repair of paraesophageal hernia Active Problems:   Chronic obstructive pulmonary disease (HCC)   Hypertension   Diabetic peripheral neuropathy (HCC)   Type 2 diabetes mellitus with diabetic neuropathy, without long-term current use of insulin (HCC)   Umbilical hernia without obstruction and without gangrene   AKI (acute kidney injury) ([place])   Hyperkalemia   Acute blood loss anemia   Procedures:  Robotic converted to open paraesophageal hernia repair with Nissen fundoplication and umbilical hernia repair.  Hospital Course: Patient was admitted post-operatively.  He had issues with AKI and COPD flare-up during his hospital stay.  His hemoglobin was stable.  His Cr improved with hydration.  He was initially seen by Pulmonary for COPD flareup and was started on prednisone, but he improved very quickly and prednisone was disontinued.  He was also on Zosyn temporarily for suspicion of pneumonia, but this was also discontinued.  Overall patient is feeling well, ambulating, tolerating diet, pain is controlled, and is ready for discharge.  Abdomen is soft, non-distended, appropriately sore.  Incisions are clean, dry, intact with staples.  Prior drain site is healing well.  Dry gauze dressing applied.  Follow up with Dr. Dahlia Byes on 02/02/22.  Consults: Pulmonary, Hospitalist, PT  Disposition: Discharge disposition: 01-Home or Self Care       Discharge Instructions     Call MD for:  difficulty breathing, headache or visual disturbances   Complete by: As directed    Call MD for:  persistant nausea and vomiting   Complete by: As directed    Call MD for:  redness, tenderness, or signs of infection (pain, swelling, redness, odor or green/yellow discharge around incision site)   Complete by: As directed    Call MD for:   severe uncontrolled pain   Complete by: As directed    Call MD for:  temperature >100.4   Complete by: As directed    Diet - low sodium heart healthy   Complete by: As directed    Discharge instructions   Complete by: As directed    1.  Patient may shower, but do not scrub wounds heavily and dab dry only. 2.  Do not submerge wounds in pool/tub until fully healed. 3.  Do not remove staples. 4.  Dressing:  Please apply dry gauze dressing over prior drain site once daily until the incision is fully healed.   Driving Restrictions   Complete by: As directed    Do not drive while taking narcotics for pain control.  Prior to driving, make sure you are able to rotate right and left to look at blindspots without significant pain or discomfort.   Increase activity slowly   Complete by: As directed    Lifting restrictions   Complete by: As directed    No heavy lifting or pushing of more than 10-15 lbs for 4 weeks.      Allergies as of 01/25/2022       Reactions   Glipizide Other (See Comments), Palpitations   Shaky, feel bad Other reaction(s): Dizziness   Cephalexin Rash   Duloxetine Anxiety, Nausea Only        Medication List     TAKE these medications    Accu-Chek Softclix Lancets lancets 3 (three) times daily.   acetaminophen 500 MG tablet Commonly known as: TYLENOL Take 2 tablets (1,000 mg total)  by mouth every 8 (eight) hours.   amitriptyline 10 MG tablet Commonly known as: ELAVIL Take 30 mg by mouth at bedtime.   ascorbic acid 500 MG tablet Commonly known as: VITAMIN C Take by mouth daily.   B-D BLUNT FILL NEEDLE 18G X 1-1/2" Misc Generic drug: NEEDLE (DISP) 18 G Use 18G needle to draw testosterone for injection   BD Disp Needles 22G X 1-1/2" Misc Generic drug: NEEDLE (DISP) 22 G every 14 (fourteen) days.   BD Plastipak Syringe 21G X 1" 3 ML Misc Generic drug: SYRINGE-NEEDLE (DISP) 3 ML USE AS DIRECTED   ezetimibe 10 MG tablet Commonly known as:  ZETIA Take 10 mg by mouth daily.   ferrous sulfate 325 (65 FE) MG tablet Take 325 mg by mouth daily with breakfast.   fluticasone 50 MCG/ACT nasal spray Commonly known as: FLONASE Place into both nostrils.   gabapentin 600 MG tablet Commonly known as: NEURONTIN Take 600 mg by mouth 3 (three) times daily.   HumuLIN 70/30 KwikPen (70-30) 100 UNIT/ML KwikPen Generic drug: insulin isophane & regular human KwikPen Inject 58 Units into the skin 2 (two) times daily with a meal. 58 with meals and 54 qhs   hydrochlorothiazide 25 MG tablet Commonly known as: HYDRODIURIL Take 25 mg by mouth daily.   HYDROcodone-acetaminophen 5-325 MG tablet Commonly known as: NORCO/VICODIN Take 1 tablet by mouth 2 (two) times daily as needed. Must last 30 days. Start taking on: January 27, 2022   HYDROcodone-acetaminophen 5-325 MG tablet Commonly known as: NORCO/VICODIN Take 1 tablet by mouth 2 (two) times daily as needed. Must last 30 days. Start taking on: February 26, 2022   HYDROcodone-acetaminophen 5-325 MG tablet Commonly known as: NORCO/VICODIN Take 1 tablet by mouth 2 (two) times daily as needed. Must last 30 days. Start taking on: March 28, 2022   lisinopril 40 MG tablet Commonly known as: ZESTRIL Take 40 mg by mouth daily.   metFORMIN 500 MG 24 hr tablet Commonly known as: GLUCOPHAGE-XR TAKE 4 TABLETS(2000 MG) BY MOUTH DAILY   metoprolol tartrate 100 MG tablet Commonly known as: LOPRESSOR Take 1 tablet (100 mg total) by mouth once for 1 dose. Please take one time dose 156m metoprolol tartrate 2 hr prior to cardiac CT for HR control IF HR >55bpm.   montelukast 10 MG tablet Commonly known as: SINGULAIR Take 10 mg by mouth daily.   naloxone 4 MG/0.1ML Liqd nasal spray kit Commonly known as: NARCAN Place 1 spray into the nose as needed for up to 365 doses (for opioid-induced respiratory depresssion). In case of emergency (overdose), spray once into each nostril. If no response  within 3 minutes, repeat application and call [phone]   oxyCODONE 5 MG immediate release tablet Commonly known as: Oxy IR/ROXICODONE Take 1 tablet (5 mg total) by mouth every 4 (four) hours as needed for severe pain.   pantoprazole 40 MG tablet Commonly known as: PROTONIX Take 2 tablets (80 mg total) by mouth daily.   ProAir HFA 108 (90 Base) MCG/ACT inhaler Generic drug: albuterol Inhale 2 puffs into the lungs every 6 (six) hours as needed.   rosuvastatin 40 MG tablet Commonly known as: CRESTOR Take 40 mg by mouth daily.   tadalafil 5 MG tablet Commonly known as: CIALIS Take 1 tablet (5 mg total) by mouth daily as needed for erectile dysfunction.   testosterone cypionate 200 MG/ML injection Commonly known as: DEPOTESTOSTERONE CYPIONATE Inject 0.5 mLs (100 mg total) into the muscle every 14 (fourteen) days.  Trelegy Ellipta 200-62.5-25 MCG/ACT Aepb Generic drug: Fluticasone-Umeclidin-Vilant Inhale 1 puff into the lungs daily.   triamcinolone cream 0.1 % Commonly known as: KENALOG Apply 1 application  topically 2 (two) times daily.   Trulicity 3 KD/9.8PJ Sopn Generic drug: Dulaglutide Inject 3 mg into the skin once a week.        Follow-up Information     Jules Husbands, MD. Go on 02/02/2022.   Specialty: General Surgery Why: s/p paraesophageal hernia repair  Appointmernt Monday 02/02/22 at 2:45 Contact information: [address] [place] [address] [phone]

## 2022-01-25 NOTE — Progress Notes (Signed)
PULMONOLOGY         Date: 01/25/2022,   MRN# [id number] Darren Allen December 27, 1965     AdmissionWeight: 111.1 kg                 CurrentWeight: 111.1 kg  Referring provider: Otho Ket PA   CHIEF COMPLAINT:   Respiratory distress in context of COPD with post surgical status with paraesophageal hernia.    HISTORY OF PRESENT ILLNESS   [name] is 56 y.o. male  with hx of COPD and LPR with chronic cough with OSA overlap syndrome. Patient has a complex medical history including advanced diabetes with neuropathy, GERD, nephrolithiasis dyslipidemia, morbid obesity chronic pain syndrome hypergonadism as well as asthma/COPD overlap with OSA.  Patient had CT chest Aug 08, 2020 which I reviewed independently with findings of lung nodules which are chronic and stable dating back to 2016.  I reviewed echo from October 2022 with findings of normal LV systolic and RV systolic function trivial regurgitation noted with an LVEF of over 55% and mild MR and TR.  S/p hiatal hernia repair with mild acute exacerbation of COPD.  He walked around hallway.   SPIROMETRIC DATA 07/02/21- FEV1- 2.86L- 68%    01/24/22-  patient is improved, wheezing essentially resolved, he shares "I fell better then I have in along time".  De escalating antimicrobials, steroids and other medications to optimize for dc. He does have hx of chronic EtOH use and shares onset of nervousnees will treat with low dose alprazolam.   01/25/22- patient is imporved, slept well overnight, renal function improved, cleared for dc home.   PAST MEDICAL HISTORY   Past Medical History:  Diagnosis Date   Allergy    Calculus of kidney 02/04/2015   COPD (chronic obstructive pulmonary disease) (HCC)    Diabetes mellitus without complication ([place])    type 2   Hypertension    Neuropathy      SURGICAL HISTORY   Past Surgical History:  Procedure Laterality Date   APPENDECTOMY     COLONOSCOPY WITH PROPOFOL N/A  12/31/2021   Procedure: COLONOSCOPY WITH PROPOFOL;  Surgeon: Jonathon Bellows, MD;  Location: [facility] ENDOSCOPY;  Service: Gastroenterology;  Laterality: N/A;   ESOPHAGOGASTRODUODENOSCOPY N/A 12/31/2021   Procedure: ESOPHAGOGASTRODUODENOSCOPY (EGD);  Surgeon: Jonathon Bellows, MD;  Location: [facility] ENDOSCOPY;  Service: Gastroenterology;  Laterality: N/A;   INCISION AND DRAINAGE PERIRECTAL ABSCESS N/A 02/04/2015   Procedure: IRRIGATION AND DEBRIDEMENT PERIRECTAL ABSCESS;  Surgeon: Marlyce Huge, MD;  Location: [facility] ORS;  Service: General;  Laterality: N/A;   INSERTION OF MESH  01/20/2022   Procedure: INSERTION OF MESH;  Surgeon: Jules Husbands, MD;  Location: [facility] ORS;  Service: General;;   KIDNEY STONE SURGERY Right    lung mass removal N/A    ORIF ANKLE FRACTURE Left 03/23/2017   Procedure: OPEN REDUCTION INTERNAL FIXATION (ORIF) ANKLE FRACTURE;  Surgeon: Leim Fabry, MD;  Location: [facility] ORS;  Service: Orthopedics;  Laterality: Left;   RECTAL EXAM UNDER ANESTHESIA  02/04/2015   Procedure: RECTAL EXAM UNDER ANESTHESIA;  Surgeon: Marlyce Huge, MD;  Location: [facility] ORS;  Service: General;;   SYNDESMOSIS REPAIR Left 03/23/2017   Procedure: SYNDESMOSIS REPAIR;  Surgeon: Leim Fabry, MD;  Location: [facility] ORS;  Service: Orthopedics;  Laterality: Left;   UMBILICAL HERNIA REPAIR  01/20/2022   Procedure: HERNIA REPAIR UMBILICAL ADULT;  Surgeon: Jules Husbands, MD;  Location: [facility] ORS;  Service: General;;   XI ROBOTIC ASSISTED PARAESOPHAGEAL HERNIA REPAIR N/A 01/20/2022  Procedure: XI ROBOTIC ASSISTED PARAESOPHAGEAL HERNIA REPAIR, CONVERTED TO OPEN, RNFA to assist;  Surgeon: Jules Husbands, MD;  Location: [facility] ORS;  Service: General;  Laterality: N/A;     FAMILY HISTORY   Family History  Problem Relation Age of Onset   Cancer Mother 60       Lung   Cancer Father        Colon   Heart disease Father    Alcohol abuse Father    Cancer Brother 42       Esophageal   Diabetes Brother    Heart  disease Brother      SOCIAL HISTORY   Social History   Tobacco Use   Smoking status: Every Day    Packs/day: 1.00    Years: 34.00    Total pack years: 34.00    Types: Cigarettes   Smokeless tobacco: Never   Tobacco comments:    patient using Nicoderm patches  Vaping Use   Vaping Use: Never used  Substance Use Topics   Alcohol use: Not Currently    Alcohol/week: 12.0 standard drinks of alcohol    Types: 12 Cans of beer per week    Comment: Quit February 2023   Drug use: No     MEDICATIONS    Home Medication:    Current Medication:  Current Facility-Administered Medications:    0.9 %  sodium chloride infusion, , Intravenous, PRN, Pabon, Diego F, MD, Stopped at 01/24/22 1027   acetaminophen (TYLENOL) tablet 1,000 mg, 1,000 mg, Oral, Q6H, Pabon, Diego F, MD, 1,000 mg at 01/25/22 0519   albuterol (VENTOLIN HFA) 108 (90 Base) MCG/ACT inhaler 1-2 puff, 1-2 puff, Inhalation, Q6H PRN, Pabon, Diego F, MD, 2 puff at 01/21/22 2100   ALPRAZolam (XANAX) tablet 0.5 mg, 0.5 mg, Oral, TID, Lanney Gins, Cristina Ceniceros, MD, 0.5 mg at 01/25/22 0906   amitriptyline (ELAVIL) tablet 30 mg, 30 mg, Oral, QHS, Pabon, Diego F, MD, 30 mg at 01/24/22 2118   budesonide (PULMICORT) nebulizer solution 0.25 mg, 0.25 mg, Nebulization, BID, Kc, Ramesh, MD, 0.25 mg at 01/25/22 0817   cyclobenzaprine (FLEXERIL) tablet 10 mg, 10 mg, Oral, TID, Pabon, Diego F, MD, 10 mg at 01/25/22 0905   diphenhydrAMINE (BENADRYL) 12.5 MG/5ML elixir 12.5 mg, 12.5 mg, Oral, Q6H PRN **OR** diphenhydrAMINE (BENADRYL) injection 12.5 mg, 12.5 mg, Intravenous, Q6H PRN, Pabon, Diego F, MD   fluticasone (FLONASE) 50 MCG/ACT nasal spray 1 spray, 1 spray, Each Nare, Daily, Pabon, Diego F, MD, 1 spray at 01/25/22 0907   heparin injection 5,000 Units, 5,000 Units, Subcutaneous, Q8H, Attallah Ontko, MD, 5,000 Units at 01/25/22 0519   hydrALAZINE (APRESOLINE) injection 10 mg, 10 mg, Intravenous, Q4H PRN, Amin, Ankit Chirag, MD   insulin aspart  (novoLOG) injection 0-9 Units, 0-9 Units, Subcutaneous, TID WC, Kc, Ramesh, MD, 1 Units at 01/25/22 0910   insulin aspart (novoLOG) injection 10 Units, 10 Units, Subcutaneous, TID WC, Jose Persia, MD, 10 Units at 01/25/22 0913   insulin glargine-yfgn (SEMGLEE) injection 25 Units, 25 Units, Subcutaneous, QHS, Jose Persia, MD, 25 Units at 01/24/22 2118   ipratropium-albuterol (DUONEB) 0.5-2.5 (3) MG/3ML nebulizer solution 3 mL, 3 mL, Nebulization, Q4H PRN, Sharion Settler, NP, 3 mL at 01/23/22 1242   lidocaine (LIDODERM) 5 % 2 patch, 2 patch, Transdermal, Q24H, Pabon, Diego F, MD, 2 patch at 01/23/22 2108   LORazepam (ATIVAN) tablet 2 mg, 2 mg, Oral, Q6H PRN, Tylene Fantasia, PA-C, 2 mg at 01/22/22 1144   melatonin tablet 5 mg, 5  mg, Oral, QHS PRN, Pabon, Diego F, MD, 5 mg at 01/23/22 2115   metoprolol tartrate (LOPRESSOR) injection 5 mg, 5 mg, Intravenous, Q4H PRN, Amin, Ankit Chirag, MD   montelukast (SINGULAIR) tablet 10 mg, 10 mg, Oral, Daily, Pabon, Diego F, MD, 10 mg at 01/25/22 0904   morphine (PF) 4 MG/ML injection 4 mg, 4 mg, Intravenous, Q3H PRN, Pabon, Diego F, MD, 4 mg at 01/21/22 2040   naloxone (NARCAN) nasal spray 4 mg/0.1 mL, 1 spray, Nasal, PRN, Pabon, Diego F, MD   ondansetron (ZOFRAN-ODT) disintegrating tablet 4 mg, 4 mg, Oral, Q6H PRN **OR** ondansetron (ZOFRAN) injection 4 mg, 4 mg, Intravenous, Q6H PRN, Pabon, Diego F, MD   Oral care mouth rinse, 15 mL, Mouth Rinse, PRN, Pabon, Diego F, MD   oxyCODONE (Oxy IR/ROXICODONE) immediate release tablet 5-10 mg, 5-10 mg, Oral, Q4H PRN, Pabon, Diego F, MD, 10 mg at 01/25/22 0903   pregabalin (LYRICA) capsule 100 mg, 100 mg, Oral, TID, Pabon, Diego F, MD, 100 mg at 01/25/22 9326   prochlorperazine (COMPAZINE) tablet 10 mg, 10 mg, Oral, Q6H PRN **OR** prochlorperazine (COMPAZINE) injection 5-10 mg, 5-10 mg, Intravenous, Q6H PRN, Pabon, Diego F, MD   umeclidinium-vilanterol (ANORO ELLIPTA) 62.5-25 MCG/ACT 1 puff, 1 puff,  Inhalation, Daily, Kc, Ramesh, MD, 1 puff at 01/25/22 0901    ALLERGIES   Glipizide, Cephalexin, and Duloxetine     REVIEW OF SYSTEMS    Review of Systems:  Gen:  Denies  fever, sweats, chills weigh loss  HEENT: Denies blurred vision, double vision, ear pain, eye pain, hearing loss, nose bleeds, sore throat Cardiac:  No dizziness, chest pain or heaviness, chest tightness,edema Resp:   reports dyspnea chronically  Gi: Denies swallowing difficulty, stomach pain, nausea or vomiting, diarrhea, constipation, bowel incontinence Gu:  Denies bladder incontinence, burning urine Ext:   Denies Joint pain, stiffness or swelling Skin: Denies  skin rash, easy bruising or bleeding or hives Endoc:  Denies polyuria, polydipsia , polyphagia or weight change Psych:   Denies depression, insomnia or hallucinations   Other:  All other systems negative   VS: BP 125/77 (BP Location: Left Arm)   Pulse 79   Temp (!) 97.5 F (36.4 C) (Oral)   Resp 16   Ht '6\' 2"'$  (1.88 m)   Wt 111.1 kg   SpO2 100%   BMI 31.46 kg/m      PHYSICAL EXAM    GENERAL:NAD, no fevers, chills, no weakness no fatigue HEAD: Normocephalic, atraumatic.  EYES: Pupils equal, round, reactive to light. Extraocular muscles intact. No scleral icterus.  MOUTH: Moist mucosal membrane. Dentition intact. No abscess noted.  EAR, NOSE, THROAT: Clear without exudates. No external lesions.  NECK: Supple. No thyromegaly. No nodules. No JVD.  PULMONARY: decreased breath sounds with mild rhonchi worse at bases bilaterally.  CARDIOVASCULAR: S1 and S2. Regular rate and rhythm. No murmurs, rubs, or gallops. No edema. Pedal pulses 2+ bilaterally.  GASTROINTESTINAL: Soft, nontender, nondistended. No masses. Positive bowel sounds. No hepatosplenomegaly.  MUSCULOSKELETAL: No swelling, clubbing, or edema. Range of motion full in all extremities.  NEUROLOGIC: Cranial nerves II through XII are intact. No gross focal neurological deficits.  Sensation intact. Reflexes intact.  SKIN: No ulceration, lesions, rashes, or cyanosis. Skin warm and dry. Turgor intact.  PSYCHIATRIC: Mood, affect within normal limits. The patient is awake, alert and oriented x 3. Insight, judgment intact.       IMAGING   SPIROMETRIC DATA 07/02/21- FEV1- 2.86L- 68%   ASSESSMENT/PLAN  Mild to moderate acute exacerbation of COPD       Will dc antimicrobials and prednisone      - continue incentive spirometry  Chronic EtOH      Patient expresses nervousness - will treat with low dose xanax   OSA on cpap   -Hold CPAP for 2 wks while recovering with fresh surgical wounds      S/p paraesophageal hernia repair - recovering well            Surgery on case - Dr Hampton Abbot covering today - discussed case in detail                      Plan for possible dc home in am post improvement in renal function.   Continue PT/OT, patient is close to baseline may initiate DC planning.        Thank you for allowing me to participate in the care of this patient.   Patient/Family are satisfied with care plan and all questions have been answered.    Provider disclosure: Patient with at least one acute or chronic illness or injury that poses a threat to life or bodily function and is being managed actively during this encounter.  All of the below services have been performed independently by signing provider:  review of prior documentation from internal and or external health records.  Review of previous and current lab results.  Interview and comprehensive assessment during patient visit today. Review of current and previous chest radiographs/CT scans. Discussion of management and test interpretation with health care team and patient/family.   This document was prepared using Dragon voice recognition software and may include unintentional dictation errors.     Ottie Glazier, M.D.  Division of Pulmonary & Critical Care Medicine

## 2022-01-27 ENCOUNTER — Telehealth: Payer: Self-pay | Admitting: *Deleted

## 2022-01-27 ENCOUNTER — Telehealth: Payer: Self-pay

## 2022-01-27 NOTE — Telephone Encounter (Signed)
Transition Care Management Follow-up Telephone Call Date of discharge and from where: 01/25/2022, [facility] How have you been since you were released from the hospital? Patient states he does not fell well.  Any questions or concerns? No  Items Reviewed: Did the pt receive and understand the discharge instructions provided? Yes  Medications obtained and verified? Yes  Other?  Any new allergies since your discharge? No  Dietary orders reviewed? Yes Do you have support at home? Yes   Home Care and Equipment/Supplies: Were home health services ordered? yes If so, what is the name of the agency? Starts with D and is in [place] per patient.   Has the agency set up a time to come to the patient's home? no Were any new equipment or medical supplies ordered?  No What is the name of the medical supply agency?  Were you able to get the supplies/equipment? not applicable Do you have any questions related to the use of the equipment or supplies? No  Functional Questionnaire: (I = Independent and D = Dependent) ADLs: I  Bathing/Dressing- I  Meal Prep- I  Eating- I  Maintaining continence- I  Transferring/Ambulation- I  Managing Meds- I  Follow up appointments reviewed:  PCP Hospital f/u appt confirmed? Yes  Scheduled to see Jon Billings NP  on 01/28/2022 @ 4:20. [facility] f/u appt confirmed? No   Are transportation arrangements needed? No  If their condition worsens, is the pt aware to call PCP or go to the Emergency Dept.? Yes Was the patient provided with contact information for the PCP's office or ED? Yes Was to pt encouraged to call back with questions or concerns? Yes

## 2022-01-27 NOTE — Telephone Encounter (Signed)
Greg the pharmacist from [facility] in [place] called and stated that we prescribed him oxycodone on 01/25/22 30 tablets to [facility] [place] and [facility] to [facility] on [address] He was calling to let us know that the patient did pick up both prescriptions.

## 2022-01-28 ENCOUNTER — Encounter: Payer: Self-pay | Admitting: Nurse Practitioner

## 2022-01-28 ENCOUNTER — Ambulatory Visit (INDEPENDENT_AMBULATORY_CARE_PROVIDER_SITE_OTHER): Payer: HMO | Admitting: Nurse Practitioner

## 2022-01-28 VITALS — BP 129/72 | HR 104 | Temp 98.5°F | Wt 236.1 lb

## 2022-01-28 DIAGNOSIS — I739 Peripheral vascular disease, unspecified: Secondary | ICD-10-CM

## 2022-01-28 DIAGNOSIS — J449 Chronic obstructive pulmonary disease, unspecified: Secondary | ICD-10-CM | POA: Diagnosis not present

## 2022-01-28 DIAGNOSIS — I10 Essential (primary) hypertension: Secondary | ICD-10-CM | POA: Diagnosis not present

## 2022-01-28 DIAGNOSIS — Z8719 Personal history of other diseases of the digestive system: Secondary | ICD-10-CM

## 2022-01-28 DIAGNOSIS — Z09 Encounter for follow-up examination after completed treatment for conditions other than malignant neoplasm: Secondary | ICD-10-CM | POA: Diagnosis not present

## 2022-01-28 DIAGNOSIS — Z9889 Other specified postprocedural states: Secondary | ICD-10-CM

## 2022-01-28 NOTE — Progress Notes (Unsigned)
BP 129/72   Pulse (!) 104   Temp 98.5 F (36.9 C) (Oral)   Wt 236 lb 1.6 oz (107.1 kg)   SpO2 96%   BMI 30.31 kg/m    Subjective:    Patient ID: Darren Allen, male    DOB: 02-08-1966, 57 y.o.   MRN: [id number]  HPI: Darren Allen is a 56 y.o. male  Chief Complaint  Patient presents with   Hospitalization Follow-up    Patient states he has been having low blood pressure s/p surgery. States his BP yesterday at home was 69/62.    Transition of Care Hospital Follow up.   Hospital/Facility: [facility] D/C Physician: Dr. Hampton Abbot D/C Date: 01/25/2022  Records Requested: NA Records Received: NA Records Reviewed: Yes  Diagnoses on Discharge:   S/P repair of paraesophageal hernia Active Problems:   Chronic obstructive pulmonary disease ([place])   Hypertension   Diabetic peripheral neuropathy ([place])   Type 2 diabetes mellitus with diabetic neuropathy, without long-term current use of insulin ([place])   Umbilical hernia without obstruction and without gangrene   AKI (acute kidney injury) ([place])   Hyperkalemia   Acute blood loss anemia  Date of interactive Contact within 48 hours of discharge:  Contact was through: phone  Date of 7 day or 14 day face-to-face visit:    within 7 days  Outpatient Encounter Medications as of 01/28/2022  Medication Sig Note   acetaminophen (TYLENOL) 500 MG tablet Take 2 tablets (1,000 mg total) by mouth every 8 (eight) hours.    amitriptyline (ELAVIL) 10 MG tablet Take 30 mg by mouth at bedtime.    ascorbic acid (VITAMIN C) 500 MG tablet Take by mouth daily.    ferrous sulfate 325 (65 FE) MG tablet Take 325 mg by mouth daily with breakfast.    fluticasone (FLONASE) 50 MCG/ACT nasal spray Place into both nostrils.    gabapentin (NEURONTIN) 600 MG tablet Take 600 mg by mouth 3 (three) times daily.    hydrochlorothiazide (HYDRODIURIL) 25 MG tablet Take 25 mg by mouth daily.    HYDROcodone-acetaminophen (NORCO/VICODIN) 5-325 MG tablet Take 1 tablet by  mouth 2 (two) times daily as needed. Must last 30 days.    insulin isophane & regular human (HUMULIN 70/30 KWIKPEN) (70-30) 100 UNIT/ML KwikPen Inject 58 Units into the skin 2 (two) times daily with a meal. 58 with meals and 54 qhs    lisinopril (PRINIVIL,ZESTRIL) 40 MG tablet Take 40 mg by mouth daily.    metFORMIN (GLUCOPHAGE-XR) 500 MG 24 hr tablet TAKE 4 TABLETS(2000 MG) BY MOUTH DAILY    montelukast (SINGULAIR) 10 MG tablet Take 10 mg by mouth daily.    oxyCODONE (OXY IR/ROXICODONE) 5 MG immediate release tablet Take 1 tablet (5 mg total) by mouth every 4 (four) hours as needed for severe pain.    pantoprazole (PROTONIX) 40 MG tablet Take 2 tablets (80 mg total) by mouth daily.    PROAIR HFA 108 (90 Base) MCG/ACT inhaler Inhale 2 puffs into the lungs every 6 (six) hours as needed.    rosuvastatin (CRESTOR) 40 MG tablet Take 40 mg by mouth daily.    tadalafil (CIALIS) 5 MG tablet Take 1 tablet (5 mg total) by mouth daily as needed for erectile dysfunction.    testosterone cypionate (DEPOTESTOSTERONE CYPIONATE) 200 MG/ML injection Inject 0.5 mLs (100 mg total) into the muscle every 14 (fourteen) days.    TRELEGY ELLIPTA 200-62.5-25 MCG/INH AEPB Inhale 1 puff into the lungs daily.  triamcinolone cream (KENALOG) 0.1 % Apply 1 application  topically 2 (two) times daily.    TRULICITY 3 MG/0.5ML SOPN Inject 3 mg into the skin once a week.    ezetimibe (ZETIA) 10 MG tablet Take 10 mg by mouth daily. (Patient not taking: Reported on 01/20/2022)    [START ON 02/26/2022] HYDROcodone-acetaminophen (NORCO/VICODIN) 5-325 MG tablet Take 1 tablet by mouth 2 (two) times daily as needed. Must last 30 days.    [START ON 03/28/2022] HYDROcodone-acetaminophen (NORCO/VICODIN) 5-325 MG tablet Take 1 tablet by mouth 2 (two) times daily as needed. Must last 30 days. 01/13/2022: WARNING: Not a Duplicate. Future prescription. DO NOT DELETE during hospital medication reconciliation or at discharge. [facility] Chronic Pain  Management Patient    metoprolol tartrate (LOPRESSOR) 100 MG tablet Take 1 tablet (100 mg total) by mouth once for 1 dose. Please take one time dose 100mg metoprolol tartrate 2 hr prior to cardiac CT for HR control IF HR >55bpm.    naloxone (NARCAN) nasal spray 4 mg/0.1 mL Place 1 spray into the nose as needed for up to 365 doses (for opioid-induced respiratory depresssion). In case of emergency (overdose), spray once into each nostril. If no response within 3 minutes, repeat application and call 911.    [DISCONTINUED] Accu-Chek Softclix Lancets lancets 3 (three) times daily. (Patient not taking: Reported on 01/28/2022)    [DISCONTINUED] BD DISP NEEDLES 22G X 1-1/2" MISC every 14 (fourteen) days. (Patient not taking: Reported on 01/28/2022)    [DISCONTINUED] BD PLASTIPAK SYRINGE 21G X 1" 3 ML MISC USE AS DIRECTED (Patient not taking: Reported on 01/28/2022)    [DISCONTINUED] NEEDLE, DISP, 18 G (B-D BLUNT FILL NEEDLE) 18G X 1-1/2" MISC Use 18G needle to draw testosterone for injection (Patient not taking: Reported on 01/28/2022)    No facility-administered encounter medications on file as of 01/28/2022.    Diagnostic Tests Reviewed/Disposition: Reviewed  Consults: General Surgery  Discharge Instructions: Reviewed with patient  Disease/illness Education: Provided during visit  Home Health/Community Services Discussions/Referrals: NA  Establishment or re-establishment of referral orders for community resources: NA  Discussion with other health care providers: NA  Assessment and Support of treatment regimen adherence: Provided during visit  Appointments Coordinated with: Patient  Education for self-management, independent living, and ADLs: Provided during visit  Relevant past medical, surgical, family and social history reviewed and updated as indicated. Interim medical history since our last visit reviewed. Allergies and medications reviewed and updated.  Review of Systems   Constitutional:  Positive for fatigue.  Eyes:  Negative for visual disturbance.  Respiratory:  Negative for shortness of breath.   Cardiovascular:  Negative for chest pain and leg swelling.  Neurological:  Negative for light-headedness and headaches.    Per HPI unless specifically indicated above     Objective:    BP 129/72   Pulse (!) 104   Temp 98.5 F (36.9 C) (Oral)   Wt 236 lb 1.6 oz (107.1 kg)   SpO2 96%   BMI 30.31 kg/m   Wt Readings from Last 3 Encounters:  01/28/22 236 lb 1.6 oz (107.1 kg)  01/20/22 245 lb (111.1 kg)  01/14/22 246 lb (111.6 kg)    Physical Exam Vitals and nursing note reviewed.  Constitutional:      General: He is not in acute distress.    Appearance: Normal appearance. He is not ill-appearing, toxic-appearing or diaphoretic.  HENT:     Head: Normocephalic.     Right Ear: External ear normal.       Left Ear: External ear normal.     Nose: Nose normal. No congestion or rhinorrhea.     Mouth/Throat:     Mouth: Mucous membranes are moist.  Eyes:     General:        Right eye: No discharge.        Left eye: No discharge.     Extraocular Movements: Extraocular movements intact.     Conjunctiva/sclera: Conjunctivae normal.     Pupils: Pupils are equal, round, and reactive to light.  Cardiovascular:     Rate and Rhythm: Normal rate and regular rhythm.     Heart sounds: No murmur heard. Pulmonary:     Effort: Pulmonary effort is normal. No respiratory distress.     Breath sounds: Wheezing present. No rhonchi or rales.  Abdominal:     General: Abdomen is flat. Bowel sounds are normal.  Musculoskeletal:     Cervical back: Normal range of motion and neck supple.  Skin:    General: Skin is warm and dry.     Capillary Refill: Capillary refill takes less than 2 seconds.  Neurological:     General: No focal deficit present.     Mental Status: He is alert and oriented to person, place, and time.  Psychiatric:        Mood and Affect: Mood normal.         Behavior: Behavior normal.        Thought Content: Thought content normal.        Judgment: Judgment normal.     Results for orders placed or performed in visit on 01/28/22  CBC  Result Value Ref Range   WBC 10.4 3.4 - 10.8 x10E3/uL   RBC 3.61 (L) 4.14 - 5.80 x10E6/uL   Hemoglobin 9.8 (L) 13.0 - 17.7 g/dL   Hematocrit 30.8 (L) 37.5 - 51.0 %   MCV 85 79 - 97 fL   MCH 27.1 26.6 - 33.0 pg   MCHC 31.8 31.5 - 35.7 g/dL   RDW 13.8 11.6 - 15.4 %   Platelets 356 150 - 450 x10E3/uL  Comp Met (CMET)  Result Value Ref Range   Glucose 152 (H) 70 - 99 mg/dL   BUN 23 6 - 24 mg/dL   Creatinine, Ser 1.42 (H) 0.76 - 1.27 mg/dL   eGFR 57 (L) >59 mL/min/1.73   BUN/Creatinine Ratio 16 9 - 20   Sodium 138 134 - 144 mmol/L   Potassium 4.6 3.5 - 5.2 mmol/L   Chloride 99 96 - 106 mmol/L   CO2 23 20 - 29 mmol/L   Calcium 9.7 8.7 - 10.2 mg/dL   Total Protein 7.0 6.0 - 8.5 g/dL   Albumin 4.0 3.8 - 4.9 g/dL   Globulin, Total 3.0 1.5 - 4.5 g/dL   Albumin/Globulin Ratio 1.3 1.2 - 2.2   Bilirubin Total 0.3 0.0 - 1.2 mg/dL   Alkaline Phosphatase 72 44 - 121 IU/L   AST 24 0 - 40 IU/L   ALT 33 0 - 44 IU/L      Assessment & Plan:   Problem List Items Addressed This Visit       Cardiovascular and Mediastinum   Hypertension    Chronic.  Controlled.  Hold Lisinopril and restart HCTZ and Metoprolol.  Labs ordered today.  Return to clinic in 3 weeks for reevaluation.  Call sooner if concerns arise.        Relevant Orders   Comp Met (CMET)   PVD (peripheral vascular disease) (HCC)      Chronic.  Controlled.  Continue with current medication regimen.  Labs ordered today.  Return to clinic in 6 months for reevaluation.  Call sooner if concerns arise.          Respiratory   Chronic obstructive pulmonary disease (HCC) (Chronic)    Chronic.  Improved from hospitalization.  Continue with inhalers at home.  Follow up in 3 weeks.  Call sooner if concerns arise.         Other   S/P repair of  paraesophageal hernia    Keep follow up appointment with Surgeon.        Other Visit Diagnoses     Hospital discharge follow-up    -  Primary   CBC and CMP ordered today. Has follow up with Surgeon this week.   Relevant Orders   CBC (Completed)   Comp Met (CMET) (Completed)        Follow up plan: Return in about 3 years (around 01/28/2025) for BP Check.      

## 2022-01-29 LAB — CBC
Hematocrit: 30.8 % — ABNORMAL LOW (ref 37.5–51.0)
Hemoglobin: 9.8 g/dL — ABNORMAL LOW (ref 13.0–17.7)
MCH: 27.1 pg (ref 26.6–33.0)
MCHC: 31.8 g/dL (ref 31.5–35.7)
MCV: 85 fL (ref 79–97)
Platelets: 356 10*3/uL (ref 150–450)
RBC: 3.61 x10E6/uL — ABNORMAL LOW (ref 4.14–5.80)
RDW: 13.8 % (ref 11.6–15.4)
WBC: 10.4 10*3/uL (ref 3.4–10.8)

## 2022-01-29 LAB — COMPREHENSIVE METABOLIC PANEL
ALT: 33 IU/L (ref 0–44)
AST: 24 IU/L (ref 0–40)
Albumin/Globulin Ratio: 1.3 (ref 1.2–2.2)
Albumin: 4 g/dL (ref 3.8–4.9)
Alkaline Phosphatase: 72 IU/L (ref 44–121)
BUN/Creatinine Ratio: 16 (ref 9–20)
BUN: 23 mg/dL (ref 6–24)
Bilirubin Total: 0.3 mg/dL (ref 0.0–1.2)
CO2: 23 mmol/L (ref 20–29)
Calcium: 9.7 mg/dL (ref 8.7–10.2)
Chloride: 99 mmol/L (ref 96–106)
Creatinine, Ser: 1.42 mg/dL — ABNORMAL HIGH (ref 0.76–1.27)
Globulin, Total: 3 g/dL (ref 1.5–4.5)
Glucose: 152 mg/dL — ABNORMAL HIGH (ref 70–99)
Potassium: 4.6 mmol/L (ref 3.5–5.2)
Sodium: 138 mmol/L (ref 134–144)
Total Protein: 7 g/dL (ref 6.0–8.5)
eGFR: 57 mL/min/{1.73_m2} — ABNORMAL LOW (ref >59)

## 2022-01-29 NOTE — Assessment & Plan Note (Signed)
Chronic.  Controlled.  Hold Lisinopril and restart HCTZ and Metoprolol.  Labs ordered today.  Return to clinic in 3 weeks for reevaluation.  Call sooner if concerns arise.

## 2022-01-29 NOTE — Assessment & Plan Note (Signed)
Keep follow up appointment with Surgeon.

## 2022-01-29 NOTE — Progress Notes (Signed)
Integral part of procedure

## 2022-01-29 NOTE — Assessment & Plan Note (Signed)
Chronic.  Improved from hospitalization.  Continue with inhalers at home.  Follow up in 3 weeks.  Call sooner if concerns arise.

## 2022-01-29 NOTE — Progress Notes (Signed)
Hi Micharl. It was good to see you yesterday.  Your complete blood count is improving which is great news.  Your kidney function did bump up some.  I would like you to increase your water intake.  Please come back for a lab visit next Thursday to repeat this.

## 2022-01-29 NOTE — Assessment & Plan Note (Signed)
Chronic.  Controlled.  Continue with current medication regimen.  Labs ordered today.  Return to clinic in 6 months for reevaluation.  Call sooner if concerns arise.  ? ?

## 2022-01-30 ENCOUNTER — Ambulatory Visit: Payer: Self-pay

## 2022-01-30 NOTE — Telephone Encounter (Signed)
  Chief Complaint: medication assistance Symptoms: pain from hernia surgery Frequency: ongoing 2 weeks Pertinent Negatives: NA Disposition: '[]'$ ED /'[]'$ Urgent Care (no appt availability in office) / '[]'$ Appointment(In office/virtual)/ '[]'$  [place] [facility]/ '[x]'$ Home Care/ '[]'$ Refused Recommended Disposition /'[]'$ [place] [facility]/ '[]'$  Follow-up with PCP Additional Notes: pt asking if he can stop taking the Oxycodone and start back on his normal Hydrocodone BID prn for pain. Pt states that he has been feeling really crappy and feels that the oxycodone is why. I advised pt that he can stop the oxycodone and start back on hydrocodone but if pain is not managed with hydrocodone would suggest he take oxycodone to help with breakthrough pain, did stress that pt doesn't need to take both medications together or as frequent for oxycodone if going back on hydrocodone. Pt verbalized understanding.    Reason for Disposition  Caller has medicine question only, adult not sick, AND triager answers question  Answer Assessment - Initial Assessment Questions 1. NAME of MEDICINE: "What medicine(s) are you calling about?"     hydrocodone 2. QUESTION: "What is your question?" (e.g., double dose of medicine, side effect)     Pt wanting to know if ok to stop Oxycodone and start back on Hydrocodone  3. PRESCRIBER: "Who prescribed the medicine?" Reason: if prescribed by specialist, call should be referred to that group.     Pain clinic and post surgery  Protocols used: Medication Question [facility]

## 2022-02-02 ENCOUNTER — Encounter: Payer: Self-pay | Admitting: Surgery

## 2022-02-02 ENCOUNTER — Ambulatory Visit (INDEPENDENT_AMBULATORY_CARE_PROVIDER_SITE_OTHER): Payer: HMO | Admitting: Surgery

## 2022-02-02 VITALS — BP 134/91 | HR 102 | Temp 98.5°F | Wt 225.8 lb

## 2022-02-02 DIAGNOSIS — Z09 Encounter for follow-up examination after completed treatment for conditions other than malignant neoplasm: Secondary | ICD-10-CM

## 2022-02-02 DIAGNOSIS — K449 Diaphragmatic hernia without obstruction or gangrene: Secondary | ICD-10-CM

## 2022-02-02 MED ORDER — ONDANSETRON HCL 4 MG PO TABS: 4.0000 mg | ORAL_TABLET | Freq: Three times a day (TID) | ORAL | 0 refills | Status: DC | PRN

## 2022-02-02 NOTE — Patient Instructions (Addendum)
Please pick up your prescription at your pharmacy.  If you have any concerns or questions, please feel free to call our office. See follow up appointment below.   GENERAL POST-OPERATIVE PATIENT INSTRUCTIONS   WOUND CARE INSTRUCTIONS:  Keep a dry clean dressing on the wound if there is drainage. The initial bandage may be removed after 24 hours.  Once the wound has quit draining you may leave it open to air.  If clothing rubs against the wound or causes irritation and the wound is not draining you may cover it with a dry dressing during the daytime.  Try to keep the wound dry and avoid ointments on the wound unless directed to do so.  If the wound becomes bright red and painful or starts to drain infected material that is not clear, please contact your physician immediately.  If the wound is mildly pink and has a thick firm ridge underneath it, this is normal, and is referred to as a healing ridge.  This will resolve over the next 4-6 weeks.  BATHING: You may shower if you have been informed of this by your surgeon. However, Please do not submerge in a tub, hot tub, or pool until incisions are completely sealed or have been told by your surgeon that you may do so.  DIET:  You may eat any foods that you can tolerate.  It is a good idea to eat a high fiber diet and take in plenty of fluids to prevent constipation.  If you do become constipated you may want to take a mild laxative or take ducolax tablets on a daily basis until your bowel habits are regular.  Constipation can be very uncomfortable, along with straining, after recent surgery.  ACTIVITY:  You are encouraged to cough and deep breath or use your incentive spirometer if you were given one, every 15-30 minutes when awake.  This will help prevent respiratory complications and low grade fevers post-operatively if you had a general anesthetic.  You may want to hug a pillow when coughing and sneezing to add additional support to the surgical area, if  you had abdominal or chest surgery, which will decrease pain during these times.  You are encouraged to walk and engage in light activity for the next two weeks.  You should not lift more than 20 pounds for 6 weeks total after surgery as it could put you at increased risk for complications.  Twenty pounds is roughly equivalent to a plastic bag of groceries. At that time- Listen to your body when lifting, if you have pain when lifting, stop and then try again in a few days. Soreness after doing exercises or activities of daily living is normal as you get back in to your normal routine.  MEDICATIONS:  Try to take narcotic medications and anti-inflammatory medications, such as tylenol, ibuprofen, naprosyn, etc., with food.  This will minimize stomach upset from the medication.  Should you develop nausea and vomiting from the pain medication, or develop a rash, please discontinue the medication and contact your physician.  You should not drive, make important decisions, or operate machinery when taking narcotic pain medication.  SUNBLOCK Use sun block to incision area over the next year if this area will be exposed to sun. This helps decrease scarring and will allow you avoid a permanent darkened area over your incision.  QUESTIONS:  Please feel free to call our office if you have any questions, and we will be glad to assist you. [phone]   

## 2022-02-03 ENCOUNTER — Emergency Department
Admission: EM | Admit: 2022-02-03 | Discharge: 2022-02-03 | Disposition: A | Discharge status: Home / Self Care | Payer: HMO | Attending: Emergency Medicine | Admitting: Emergency Medicine

## 2022-02-03 ENCOUNTER — Emergency Department: Payer: HMO

## 2022-02-03 ENCOUNTER — Other Ambulatory Visit: Payer: Self-pay

## 2022-02-03 ENCOUNTER — Other Ambulatory Visit: Payer: HMO

## 2022-02-03 ENCOUNTER — Encounter: Payer: Self-pay | Admitting: Emergency Medicine

## 2022-02-03 DIAGNOSIS — R1031 Right lower quadrant pain: Secondary | ICD-10-CM | POA: Diagnosis not present

## 2022-02-03 DIAGNOSIS — J449 Chronic obstructive pulmonary disease, unspecified: Secondary | ICD-10-CM | POA: Diagnosis not present

## 2022-02-03 DIAGNOSIS — I10 Essential (primary) hypertension: Secondary | ICD-10-CM | POA: Diagnosis not present

## 2022-02-03 DIAGNOSIS — I959 Hypotension, unspecified: Secondary | ICD-10-CM | POA: Insufficient documentation

## 2022-02-03 DIAGNOSIS — D72829 Elevated white blood cell count, unspecified: Secondary | ICD-10-CM | POA: Insufficient documentation

## 2022-02-03 DIAGNOSIS — R55 Syncope and collapse: Secondary | ICD-10-CM

## 2022-02-03 LAB — PROTIME-INR
INR: 1.1 (ref 0.8–1.2)
Prothrombin Time: 14.3 seconds (ref 11.4–15.2)

## 2022-02-03 LAB — CBC
HCT: 33.4 % — ABNORMAL LOW (ref 39.0–52.0)
Hemoglobin: 10.7 g/dL — ABNORMAL LOW (ref 13.0–17.0)
MCH: 26.8 pg (ref 26.0–34.0)
MCHC: 32 g/dL (ref 30.0–36.0)
MCV: 83.7 fL (ref 80.0–100.0)
Platelets: 493 10*3/uL — ABNORMAL HIGH (ref 150–400)
RBC: 3.99 MIL/uL — ABNORMAL LOW (ref 4.22–5.81)
RDW: 13.6 % (ref 11.5–15.5)
WBC: 12.6 10*3/uL — ABNORMAL HIGH (ref 4.0–10.5)
nRBC: 0 % (ref 0.0–0.2)

## 2022-02-03 LAB — HEPATIC FUNCTION PANEL
ALT: 50 U/L — ABNORMAL HIGH (ref 0–44)
AST: 35 U/L (ref 15–41)
Albumin: 3.8 g/dL (ref 3.5–5.0)
Alkaline Phosphatase: 93 U/L (ref 38–126)
Bilirubin, Direct: <0.1 mg/dL (ref 0.0–0.2)
Total Bilirubin: 0.6 mg/dL (ref 0.3–1.2)
Total Protein: 7.4 g/dL (ref 6.5–8.1)

## 2022-02-03 LAB — BASIC METABOLIC PANEL
Anion gap: 12 (ref 5–15)
BUN: 25 mg/dL — ABNORMAL HIGH (ref 6–20)
CO2: 23 mmol/L (ref 22–32)
Calcium: 9.6 mg/dL (ref 8.9–10.3)
Chloride: 100 mmol/L (ref 98–111)
Creatinine, Ser: 1.79 mg/dL — ABNORMAL HIGH (ref 0.61–1.24)
GFR, Estimated: 43 mL/min — ABNORMAL LOW (ref >60)
Glucose, Bld: 155 mg/dL — ABNORMAL HIGH (ref 70–99)
Potassium: 3.9 mmol/L (ref 3.5–5.1)
Sodium: 135 mmol/L (ref 135–145)

## 2022-02-03 LAB — LIPASE, BLOOD: Lipase: 106 U/L — ABNORMAL HIGH (ref 11–51)

## 2022-02-03 LAB — LACTIC ACID, PLASMA: Lactic Acid, Venous: 1.6 mmol/L (ref 0.5–1.9)

## 2022-02-03 LAB — APTT: aPTT: 36 seconds (ref 24–36)

## 2022-02-03 MED ORDER — SODIUM CHLORIDE 0.9 % IV BOLUS (SEPSIS)
2000.0000 mL | Freq: Once | INTRAVENOUS | Status: AC
  Administered 2022-02-03: 2000 mL via INTRAVENOUS

## 2022-02-03 MED ORDER — CIPROFLOXACIN IN D5W 400 MG/200ML IV SOLN
400.0000 mg | Freq: Once | INTRAVENOUS | Status: AC
  Administered 2022-02-03: 400 mg via INTRAVENOUS
  Filled 2022-02-03: qty 200

## 2022-02-03 MED ORDER — IOHEXOL 300 MG/ML  SOLN
100.0000 mL | Freq: Once | INTRAMUSCULAR | Status: AC | PRN
  Administered 2022-02-03: 100 mL via INTRAVENOUS

## 2022-02-03 MED ORDER — METRONIDAZOLE 500 MG/100ML IV SOLN
500.0000 mg | Freq: Once | INTRAVENOUS | Status: AC
  Administered 2022-02-03: 500 mg via INTRAVENOUS
  Filled 2022-02-03: qty 100

## 2022-02-03 MED ORDER — PANTOPRAZOLE SODIUM 40 MG PO TBEC: 80.0000 mg | DELAYED_RELEASE_TABLET | Freq: Every day | ORAL | 0 refills | Status: DC

## 2022-02-03 MED ORDER — IOHEXOL 300 MG/ML  SOLN: 80.0000 mL | Freq: Once | INTRAMUSCULAR | Status: DC | PRN

## 2022-02-03 MED ORDER — DIPHENHYDRAMINE HCL 50 MG/ML IJ SOLN
25.0000 mg | Freq: Once | INTRAMUSCULAR | Status: AC
  Administered 2022-02-03: 25 mg via INTRAVENOUS
  Filled 2022-02-03: qty 1

## 2022-02-03 MED ORDER — SODIUM CHLORIDE 0.9 % IV SOLN
1.0000 g | Freq: Once | INTRAVENOUS | Status: DC
  Administered 2022-02-03: 1 g via INTRAVENOUS
  Filled 2022-02-03: qty 10

## 2022-02-03 NOTE — ED Triage Notes (Signed)
Patient arrives by POV c/o over the last hour and half having low blood pressure. Reports having abdominal surgery 2 weeks ago. Patient reports feeling dizzy, heavy shoulders and continued abdominal pain.

## 2022-02-03 NOTE — Progress Notes (Signed)
Drue is 2 weeks out from open paraesophageal hernia repair.  He is doing well.  He did have some chronic pulmonary issues that have improved.  She is walking he is ambulating he endorses no dysphagia no reflux. He came p.o. and having bowel movements.  PE NAD Abd: Incision healing well without evidence of infection.  Staples removed.  Abdomen soft and nontender nor peritonitic  A/P doing very well.  No evidence of surgical complications.  Return to clinic in a month or so.  Advised patient to continue Nissen diet.

## 2022-02-03 NOTE — Discharge Instructions (Signed)
Your blood pressure was low.  Please do not take the lisinopril until you follow-up with your primary doctor.  I would recommend that you see your primary doctor in about 1 week for a blood pressure check.  Your blood pressure may be better controlled now that you have lost weight since your surgery.  Your CAT scans did not have any evidence of leak or infection.  Your pain medications as prescribed.

## 2022-02-03 NOTE — ED Provider Notes (Signed)
[facility] Provider Note    Event Date/Time   First MD Initiated Contact with Patient 02/03/22 1434     (approximate)   History   Post-op Problem and Dizziness   HPI  Darren Allen is a 56 y.o. male   Past medical history of recent paraesophageal hernia repair by Dr. Adora Fridge of general surgery approximately 2 weeks ago, COPD, hypertension, here to the emergency department with hypotension and lightheadedness episode that happened around noontime today.  He had been in his regular state of health and was recovering well from his surgery with no recent illnesses or medical complaints felt well this morning upon waking and checked his blood pressure which he deemed to be slightly high with a diastolic in the 24O, and took the lisinopril that he was told to not take given normalized blood pressure, and subsequently became hypotensive and lightheaded upon standing approximately 2 hours later.  He had been told to hold his lisinopril because his blood pressures have normalized and had not been taking it for months.  He denies increase in abdominal pain, bleeding, respiratory infectious symptoms, urinary symptoms or any other recent illnesses or complaints.  P.o. intake has been adequate   History was obtained via the patient.   I also reviewed external medical notes including his procedure note by Dr. Dahlia Byes earlier this month for paraesophageal hernia repair.      Physical Exam   Triage Vital Signs: ED Triage Vitals  Enc Vitals Group     BP 02/03/22 1359 99/71     Pulse Rate 02/03/22 1359 (!) 103     Resp 02/03/22 1359 16     Temp 02/03/22 1401 98.1 F (36.7 C)     Temp Source 02/03/22 1401 Oral     SpO2 02/03/22 1359 99 %     Weight 02/03/22 1400 225 lb (102.1 kg)     Height 02/03/22 1400 '6\' 2"'$  (1.88 m)     Head Circumference --      Peak Flow --      Pain Score 02/03/22 1400 4     Pain Loc --      Pain Edu? --      Excl. in [place]? --     Most  recent vital signs: Vitals:   02/03/22 1430 02/03/22 1435  BP: (!) 65/49 (!) 86/62  Pulse: 86 91  Resp: (!) 21 17  Temp:    SpO2: 97% 96%    General: Awake, no distress.  CV:  Good peripheral perfusion.  Resp:  Normal effort.  Abd:  No distention.  Other:  Is over 60s, otherwise appears well, alert and oriented, nontoxic-appearing.  Abdomen has some right lower quadrant tenderness to palpation without rigidity or guarding.   ED Results / Procedures / Treatments   Labs (all labs ordered are listed, but only abnormal results are displayed) Labs Reviewed  BASIC METABOLIC PANEL - Abnormal; Notable for the following components:      Result Value   Glucose, Bld 155 (*)    BUN 25 (*)    Creatinine, Ser 1.79 (*)    GFR, Estimated 43 (*)    All other components within normal limits  CBC - Abnormal; Notable for the following components:   WBC 12.6 (*)    RBC 3.99 (*)    Hemoglobin 10.7 (*)    HCT 33.4 (*)    Platelets 493 (*)    All other components within normal limits  HEPATIC FUNCTION  PANEL - Abnormal; Notable for the following components:   ALT 50 (*)    All other components within normal limits  LIPASE, BLOOD - Abnormal; Notable for the following components:   Lipase 106 (*)    All other components within normal limits  CULTURE, BLOOD (ROUTINE X 2)  CULTURE, BLOOD (ROUTINE X 2)  URINE CULTURE  LACTIC ACID, PLASMA  PROTIME-INR  APTT  LACTIC ACID, PLASMA  URINALYSIS, COMPLETE (UACMP) WITH MICROSCOPIC     I reviewed labs and they are notable for leukocytosis 12.6 white blood cell count.  Hgb is 10.7 improved from prior last week of 9.8.  EKG  ED ECG REPORT I, Lucillie Garfinkel, the attending physician, personally viewed and interpreted this ECG.   Date: 02/03/2022  EKG Time: 1421  Rate: 92  Rhythm: normal sinus rhythm  Axis: RAD  Intervals:none  ST&T Change: Hemic changes    RADIOLOGY I independently reviewed and interpreted cxr  and see no obvious focal  opacities or free air or pneumothorax   PROCEDURES:  Critical Care performed: No  Procedures   MEDICATIONS ORDERED IN ED: Medications  metroNIDAZOLE (FLAGYL) IVPB 500 mg (500 mg Intravenous New Bag/Given 02/03/22 1558)  iohexol (OMNIPAQUE) 300 MG/ML solution 80 mL (has no administration in time range)  ciprofloxacin (CIPRO) IVPB 400 mg (has no administration in time range)  sodium chloride 0.9 % bolus 2,000 mL (2,000 mLs Intravenous New Bag/Given 02/03/22 1456)  iohexol (OMNIPAQUE) 300 MG/ML solution 100 mL (100 mLs Intravenous Contrast Given 02/03/22 1509)  diphenhydrAMINE (BENADRYL) injection 25 mg (25 mg Intravenous Given 02/03/22 1615)    Consultants:  I spoke with general surgery consultant Dr. Dahlia Byes regarding care plan for this patient.   IMPRESSION / MDM / ASSESSMENT AND PLAN / ED COURSE  I reviewed the triage vital signs and the nursing notes.                              Differential diagnosis includes, but is not limited to, hypotension due to medication side effect, dehydration or electrolyte derangement, intra-abdominal infection, sepsis, respiratory infection urinary tract infection.   The patient is on the cardiac monitor to evaluate for evidence of arrhythmia and/or significant heart rate changes.  MDM: With recent surgery and abdominal tenderness concern for intra-abdominal infection, obtain CT scan which shows questionable leak at surgical site with formation of abscess.  He has a mild elevated white blood cell count and hypotension.  I gave him antibiotics ceftriaxone and Flagyl to cover for potential intra-abdominal infection.  He had a low level rash allergy to ceftriaxone and developed skin rash but did not appear anaphylaxis, no other complaints other than skin itching, so I stopped this medication in place of ciprofloxacin and gave him some Benadryl.  He is now getting ciprofloxacin and Flagyl and will get a CT scan esophagram with p.o. contrast to better  assess whether or not there is a leak in the surgical site.  He remained stable, receiving fluids, maps above 65.  Find out to oncoming provider pending CT scan with p.o. contrast and surgical consultation.   Patient's presentation is most consistent with acute presentation with potential threat to life or bodily function.       FINAL CLINICAL IMPRESSION(S) / ED DIAGNOSES   Final diagnoses:  Near syncope  Hypotension, unspecified hypotension type     Rx / DC Orders   ED Discharge Orders     None  Note:  This document was prepared using Dragon voice recognition software and may include unintentional dictation errors.    Lucillie Garfinkel, MD 02/03/22 [phone]

## 2022-02-03 NOTE — ED Provider Notes (Signed)
Patient was signed out to me pending CT esophagram with oral contrast.  56 year old male presents with low blood pressure at home.  Recent only had a hiatal hernia repair.  CT abdomen pelvis with contrast earlier was concerning for possible leak and fluid collection.  Dr. Kirstie Mirza with general surgery is consulted and recommend getting a CT with oral contrast.  Patient's blood pressure has responded to fluids.  He does have a mild leukocytosis with negative lactate.  CT of the abdomen with oral contrast has no evidence of enteric leak.  There was some questionable fluid collection which radiology cause could be an abscess.  I discussed with Dr. Dahlia Byes who reviewed the imaging and does not feel that this is an abscess, feels that it is most likely normal postoperative changes.  I assessed the patient he is feeling improved.  He was able to stand up and urinate and did not feel lightheaded like he did before.  Blood pressure is now stabilized.  He tells me he is lost over 20 pounds since his surgery.  He is taking hydrochlorothiazide for blood pressure but just added the lisinopril back today.  I advised him that he continue to hold the lisinopril.  I suspect his blood pressure may be better controlled now that he has had weight loss.  Recommended that he follow-up with the primary doctor in about 1 week for repeat blood pressure check and they can then decide whether he needs an additional antihypertensive added.  Given patient has no imaging or clinical findings of infection blood pressure is improved he is feeling better I think that he is appropriate for discharge   Rada Hay, MD 02/03/22 1718

## 2022-02-03 NOTE — Telephone Encounter (Signed)
Med refill request for Pantoprazole. Last filled 11/06/2021 #180 with 1 RF. F/up scheduled for 02/24/2022. Please advise

## 2022-02-04 ENCOUNTER — Other Ambulatory Visit: Payer: HMO

## 2022-02-04 ENCOUNTER — Ambulatory Visit (INDEPENDENT_AMBULATORY_CARE_PROVIDER_SITE_OTHER): Payer: HMO | Admitting: Nurse Practitioner

## 2022-02-04 ENCOUNTER — Ambulatory Visit: Payer: Self-pay | Admitting: *Deleted

## 2022-02-04 ENCOUNTER — Encounter: Payer: Self-pay | Admitting: Nurse Practitioner

## 2022-02-04 VITALS — BP 91/57 | HR 96 | Temp 98.3°F | Resp 18 | Wt 225.0 lb

## 2022-02-04 DIAGNOSIS — R748 Abnormal levels of other serum enzymes: Secondary | ICD-10-CM | POA: Diagnosis not present

## 2022-02-04 DIAGNOSIS — D509 Iron deficiency anemia, unspecified: Secondary | ICD-10-CM | POA: Insufficient documentation

## 2022-02-04 DIAGNOSIS — I952 Hypotension due to drugs: Secondary | ICD-10-CM

## 2022-02-04 DIAGNOSIS — I152 Hypertension secondary to endocrine disorders: Secondary | ICD-10-CM

## 2022-02-04 DIAGNOSIS — E1159 Type 2 diabetes mellitus with other circulatory complications: Secondary | ICD-10-CM | POA: Diagnosis not present

## 2022-02-04 DIAGNOSIS — D649 Anemia, unspecified: Secondary | ICD-10-CM | POA: Diagnosis not present

## 2022-02-04 MED ORDER — HYDROCHLOROTHIAZIDE 25 MG PO TABS: 12.5000 mg | ORAL_TABLET | Freq: Every day | ORAL | 4 refills | Status: DC

## 2022-02-04 NOTE — Assessment & Plan Note (Signed)
Acute -- he was taken off Lisinopril after recent surgery and kept on HCTZ.  However, yesterday he felt BP was elevated at 120/84 and took Lisinopril -- educated him on elevated BP levels and when to notify provider.  He is to not take Lisinopril, discussed with him at length, unless instructed by provider if BP elevations present.  BP on lower side today, but no symptoms present.  Recommend we cut HCTZ in 1/2 to 12.5 MG dosing, instructed him on this + increase water intake.  Avoid alcohol or caffeine.  Labs today to include CMP, CBC, iron, ferritin.

## 2022-02-04 NOTE — Telephone Encounter (Signed)
I called into [facility] to let them know of the situation ahead of time.   I spoke with Ubaldo Glassing.   She requested I send a high priority message over.   Message sent high priority.     Pt went to the ED yesterday for hypotension (low BP).   Was told originally it was for hypertension (elevated BP).   So dealing with a low BP reason he went to the ED.

## 2022-02-04 NOTE — Assessment & Plan Note (Signed)
Chronic, ongoing with current hypotension. Symptoms improved today.  He was taken off Lisinopril after recent surgery and kept on HCTZ.  However, yesterday he felt BP was elevated at 120/84 and took Lisinopril -- educated him on elevated BP levels and when to notify provider.  He is to not take Lisinopril, discussed with him at length, unless instructed by provider if BP elevations present.  BP on lower side today, but no symptoms present.  Recommend we cut HCTZ in 1/2 to 12.5 MG dosing, instructed him on this + increase water intake.  Return in one week.

## 2022-02-04 NOTE — Patient Instructions (Signed)
Hypotension As your heart beats, it forces blood through your body. This force is called blood pressure. If you have hypotension, you have low blood pressure.  When your blood pressure is too low, you may not get enough blood to your brain or other parts of your body. This may cause you to feel weak, light-headed, have a fast heartbeat, or even faint. Low blood pressure may be harmless, or it may cause serious problems. What are the causes? Blood loss. Not enough water in the body (dehydration). Heart problems. Hormone problems. Pregnancy. A very bad infection. Not having enough of certain nutrients. Very bad allergic reactions. Certain medicines. What increases the risk? Age. The risk increases as you get older. Conditions that affect the heart or the brain and spinal cord (central nervous system). What are the signs or symptoms? Feeling: Weak. Light-headed. Dizzy. Tired (fatigued). Blurred vision. Fast heartbeat. Fainting, in very bad cases. How is this treated? Changing your diet. This may involve drinking more water or including more salt (sodium) in your diet by eating high-salt foods. Taking medicines to raise your blood pressure. Changing how much you take (the dosage) of some of your medicines. Wearing compression stockings. These stockings help to prevent blood clots and reduce swelling in your legs. In some cases, you may need to go to the hospital to: Receive fluids through an IV tube. Receive donated blood through an IV tube (transfusion). Get treated for an infection or heart problems, if this applies. Be monitored while medicines that you are taking wear off. Follow these instructions at home: Eating and drinking  Drink enough fluids to keep your pee (urine) pale yellow. Eat a healthy diet. Follow instructions from your doctor about what you can eat or drink. A healthy diet includes: Fresh fruits and vegetables. Whole grains. Low-fat (lean) meats. Low-fat  dairy products. If told, include more salt in your diet. Do not add extra salt to your diet unless your doctor tells you to. Eat small meals often. Avoid standing up quickly after you eat. Medicines Take over-the-counter and prescription medicines only as told by your doctor. Follow instructions from your doctor about changing how much you take of your medicines, if this applies. Do not stop or change any of your medicines on your own. General instructions  Wear compression stockings as told by your doctor. Get up slowly from lying down or sitting. Avoid hot showers and a lot of heat as told by your doctor. Return to your normal activities when your doctor says that it is safe. Do not smoke or use any products that contain nicotine or tobacco. If you need help quitting, ask your doctor. Keep all follow-up visits. Contact a doctor if: You vomit. You have watery poop (diarrhea). You have a fever for more than 2-3 days. You feel more thirsty than normal. You feel weak and tired. Get help right away if: You have chest pain. You have a fast or uneven heartbeat. You lose feeling (have numbness) in any part of your body. You cannot move your arms or your legs. You have trouble talking. You get sweaty or feel light-headed. You faint. You have trouble breathing. You have trouble staying awake. You feel mixed up (confused). These symptoms may be an emergency. Get help right away. Call 911. Do not wait to see if the symptoms will go away. Do not drive yourself to the hospital. Summary Hypotension is also called low blood pressure. It is when the force of blood pumping through your body   is too weak. Hypotension may be harmless, or it may cause serious problems. Treatment may include changing your diet and medicines, and wearing compression stockings. In very bad cases, you may need to go to the hospital. This information is not intended to replace advice given to you by your health care  provider. Make sure you discuss any questions you have with your health care provider. Document Revised: 10/21/2020 Document Reviewed: 10/21/2020 Elsevier Patient Education  2023 Elsevier Inc.  

## 2022-02-04 NOTE — Telephone Encounter (Signed)
Pt added on to 10:40 slot

## 2022-02-04 NOTE — Telephone Encounter (Signed)
Reason for Disposition  [1] Caller has URGENT medicine question about med that PCP or specialist prescribed AND [2] triager unable to answer question  Protocols used: Medication Question [place]  Chief Complaint: Needs a nurse to go over his med list with him when he comes in this morning for his lab work at 11:00 today.   Also check his BP.    Nurse case manager called in and needs clarification on the lisinopril.   I read her the OV note from Jon Billings, NP and it looks like he is to hold the lisinopril.  Pt went to the ED yesterday and his BP was 65/49 there.    At 10:00 AM yesterday his BP was 126/80 per the nurse case manager at home so there is a mix up some where that she needs clarification on and pt. Does too. Symptoms: No symptoms mentioned just needing clarification on his BP medications. Frequency: N/A Pertinent Negatives: Patient denies N/A Disposition: '[]'$ ED /'[]'$ Urgent Care (no appt availability in office) / '[]'$ Appointment(In office/virtual)/ '[]'$  [place] [facility]/ '[]'$ Home Care/ '[]'$ Refused Recommended Disposition /'[]'$ [place] [facility]/ '[x]'$  Follow-up with PCP Additional Notes: Pt has an appt. This morning for lab work at 11:00.   Is it possible a nurse can go over his medications with him and do a BP check while he is there?

## 2022-02-04 NOTE — Assessment & Plan Note (Signed)
Noted on ER labs, recheck in office today and recommend reduction of any alcohol intake at home.

## 2022-02-04 NOTE — Assessment & Plan Note (Signed)
Noted on ER labs and present post-op, but is trending up.  Recheck CBC + check iron and ferritin.  Continue supplement at home.

## 2022-02-04 NOTE — Telephone Encounter (Signed)
Answer Assessment - Initial Assessment Questions 1. NAME of MEDICINE: "What medicine(s) are you calling about?"     [place] Nurse case manager calling in. Recently had hernia repair.   Jon Billings on 01/28/2022 f/u done.   He was told to hold lisinopril.   Went to ED yesterday for hypertension. The notes say to hold the lisinopril.   I'm needing to know should he restart it or continue to hold it. 2. QUESTION: "What is your question?" (e.g., double dose of medicine, side effect)     Should he take it or continue holding it? 3. PRESCRIBER: "Who prescribed the medicine?" Reason: if prescribed by specialist, call should be referred to that group.     Jon Billings, NP 4. SYMPTOMS: "Do you have any symptoms?" If Yes, ask: "What symptoms are you having?"  "How bad are the symptoms (e.g., mild, moderate, severe)     Hypertension   Went to ED yesterday 5. PREGNANCY:  "Is there any chance that you are pregnant?" "When was your last menstrual period?"     N/A  Protocols used: Medication Question Call-A-AH

## 2022-02-04 NOTE — Progress Notes (Signed)
BP (!) 91/57 (Patient Position: Sitting, Cuff Size: Normal)   Pulse 96 Comment: apical  Temp 98.3 F (36.8 C) (Oral)   Resp 18   Wt 225 lb (102.1 kg)   SpO2 98%   BMI 28.89 kg/m    Subjective:    Patient ID: Darren Allen, male    DOB: 1965/07/08, 56 y.o.   MRN: [id number]  HPI: Darren Allen is a 56 y.o. male  Chief Complaint  Patient presents with   Hypotensive    Was in ER yesterday.   DM eye exam    Request has been sent    ER FOLLOW UP Went to [facility] ER yesterday -- was feeling like BP was low and every time he stood up felt like he would fall.  Had hernia repair and emergency surgery on 01/20/22 -- has lost weight since surgery, in total 25 pounds since surgery.  Currently taking HCTZ and Lisinopril, they stopped Lisinopril 2 weeks ago after surgery.  BP was a little "elevated yesterday 120/84" so he took Lisinopril dosing.  Was given IV abx in ER for concern infection.  Recent A1c 7.2%.  Reports feeling better overall today.  Of note CBC in ER with low H/H, although some trend up since surgery.  Glucose mild elevation (is T2DM) -- was getting Prednisone.  Lipase mild elevation.   Time since discharge: 24 hours Hospital/facility: [facility] Diagnosis: hypotension Procedures/tests: Labs and CT abdomen -- this showed suspicious for abscess, no leaks, mild hepatic steatosis -- general surgery reviewed imaging and felt no abscess present Consultants: general surgery New medications: none Discharge instructions:  Follow-up with PCP Status: stable   Relevant past medical, surgical, family and social history reviewed and updated as indicated. Interim medical history since our last visit reviewed. Allergies and medications reviewed and updated.  Review of Systems  Constitutional:  Negative for activity change, diaphoresis, fatigue and fever.  Respiratory:  Negative for cough, chest tightness, shortness of breath and wheezing.   Cardiovascular:  Negative for chest pain,  palpitations and leg swelling.  Endocrine: Negative for polydipsia, polyphagia and polyuria.  Neurological:  Positive for dizziness (yesterday, none today) and syncope (yesterday, none today). Negative for weakness, light-headedness, numbness and headaches.  Psychiatric/Behavioral: Negative.      Per HPI unless specifically indicated above     Objective:    BP (!) 91/57 (Patient Position: Sitting, Cuff Size: Normal)   Pulse 96 Comment: apical  Temp 98.3 F (36.8 C) (Oral)   Resp 18   Wt 225 lb (102.1 kg)   SpO2 98%   BMI 28.89 kg/m   Wt Readings from Last 3 Encounters:  02/04/22 225 lb (102.1 kg)  02/03/22 225 lb (102.1 kg)  02/02/22 225 lb 12.8 oz (102.4 kg)    Physical Exam Vitals and nursing note reviewed.  Constitutional:      General: He is awake. He is not in acute distress.    Appearance: He is well-developed. He is not ill-appearing or toxic-appearing.  HENT:     Head: Normocephalic and atraumatic.     Right Ear: Hearing and external ear normal. No drainage.     Left Ear: Hearing and external ear normal. No drainage.  Eyes:     General: Lids are normal.        Right eye: No discharge.        Left eye: No discharge.     Extraocular Movements: Extraocular movements intact.     Conjunctiva/sclera: Conjunctivae normal.  Pupils: Pupils are equal, round, and reactive to light.     Visual Fields: Right eye visual fields normal and left eye visual fields normal.  Neck:     Thyroid: No thyromegaly.     Vascular: No carotid bruit.  Cardiovascular:     Rate and Rhythm: Normal rate and regular rhythm.     Heart sounds: Normal heart sounds, S1 normal and S2 normal. No murmur heard.    No gallop.  Pulmonary:     Effort: Pulmonary effort is normal. No accessory muscle usage or respiratory distress.     Breath sounds: Wheezing present. No decreased breath sounds or rhonchi.     Comments: Scattered expiratory wheezes throughout, baseline. Abdominal:     General: Bowel  sounds are normal. There is no distension.     Palpations: Abdomen is soft.     Tenderness: There is no abdominal tenderness.  Musculoskeletal:        General: Normal range of motion.     Cervical back: Normal range of motion and neck supple.     Right lower leg: No edema.     Left lower leg: No edema.  Lymphadenopathy:     Cervical: No cervical adenopathy.  Skin:    General: Skin is warm and dry.     Capillary Refill: Capillary refill takes less than 2 seconds.     Findings: No rash.  Neurological:     Mental Status: He is alert and oriented to person, place, and time.     Cranial Nerves: Cranial nerves 2-12 are intact.     Coordination: Coordination is intact.     Gait: Gait is intact.     Deep Tendon Reflexes: Reflexes are normal and symmetric.     Reflex Scores:      Brachioradialis reflexes are 2+ on the right side and 2+ on the left side.      Patellar reflexes are 2+ on the right side and 2+ on the left side. Psychiatric:        Attention and Perception: Attention normal.        Mood and Affect: Mood normal.        Speech: Speech normal.        Behavior: Behavior normal. Behavior is cooperative.        Thought Content: Thought content normal.    Results for orders placed or performed during the hospital encounter of 02/03/22  Blood Culture (routine x 2)   Specimen: BLOOD  Result Value Ref Range   Specimen Description BLOOD BLOOD RIGHT ARM    Special Requests      BOTTLES DRAWN AEROBIC AND ANAEROBIC Blood Culture adequate volume   Culture      NO GROWTH < 24 HOURS Performed at [facility], [address] [place], [place] [address]    Report Status PENDING   Blood Culture (routine x 2)   Specimen: BLOOD  Result Value Ref Range   Specimen Description BLOOD BLOOD LEFT ARM    Special Requests      BOTTLES DRAWN AEROBIC AND ANAEROBIC Blood Culture results may not be optimal due to an excessive volume of blood received in culture bottles   Culture      NO  GROWTH < 24 HOURS Performed at [facility], [place], [place] [address]    Report Status PENDING   Basic metabolic panel  Result Value Ref Range   Sodium 135 135 - 145 mmol/L   Potassium 3.9  3.5 - 5.1 mmol/L   Chloride 100 98 - 111 mmol/L   CO2 23 22 - 32 mmol/L   Glucose, Bld 155 (H) 70 - 99 mg/dL   BUN 25 (H) 6 - 20 mg/dL   Creatinine, Ser 1.79 (H) 0.61 - 1.24 mg/dL   Calcium 9.6 8.9 - 10.3 mg/dL   GFR, Estimated 43 (L) >60 mL/min   Anion gap 12 5 - 15  CBC  Result Value Ref Range   WBC 12.6 (H) 4.0 - 10.5 K/uL   RBC 3.99 (L) 4.22 - 5.81 MIL/uL   Hemoglobin 10.7 (L) 13.0 - 17.0 g/dL   HCT 33.4 (L) 39.0 - 52.0 %   MCV 83.7 80.0 - 100.0 fL   MCH 26.8 26.0 - 34.0 pg   MCHC 32.0 30.0 - 36.0 g/dL   RDW 13.6 11.5 - 15.5 %   Platelets 493 (H) 150 - 400 K/uL   nRBC 0.0 0.0 - 0.2 %  Lactic acid, plasma  Result Value Ref Range   Lactic Acid, Venous 1.6 0.5 - 1.9 mmol/L  Protime-INR  Result Value Ref Range   Prothrombin Time 14.3 11.4 - 15.2 seconds   INR 1.1 0.8 - 1.2  APTT  Result Value Ref Range   aPTT 36 24 - 36 seconds  Hepatic function panel  Result Value Ref Range   Total Protein 7.4 6.5 - 8.1 g/dL   Albumin 3.8 3.5 - 5.0 g/dL   AST 35 15 - 41 U/L   ALT 50 (H) 0 - 44 U/L   Alkaline Phosphatase 93 38 - 126 U/L   Total Bilirubin 0.6 0.3 - 1.2 mg/dL   Bilirubin, Direct <0.1 0.0 - 0.2 mg/dL   Indirect Bilirubin NOT CALCULATED 0.3 - 0.9 mg/dL  Lipase, blood  Result Value Ref Range   Lipase 106 (H) 11 - 51 U/L      Assessment & Plan:   Problem List Items Addressed This Visit       Cardiovascular and Mediastinum   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 Symptoms improved today.  He was taken off Lisinopril after recent surgery and kept on HCTZ.  However, yesterday he felt BP was elevated at 120/84 and took Lisinopril -- educated him on elevated BP levels and when to notify  provider.  He is to not take Lisinopril, discussed with him at length, unless instructed by provider if BP elevations present.  BP on lower side today, but no symptoms present.  Recommend we cut HCTZ in 1/2 to 12.5 MG dosing, instructed him on this + increase water intake.  Return in one week.      Relevant Medications   hydrochlorothiazide (HYDRODIURIL) 25 MG tablet   Hypotension due to drugs    Acute -- he was taken off Lisinopril after recent surgery and kept on HCTZ.  However, yesterday he felt BP was elevated at 120/84 and took Lisinopril -- educated him on elevated BP levels and when to notify provider.  He is to not take Lisinopril, discussed with him at length, unless instructed by provider if BP elevations present.  BP on lower side today, but no symptoms present.  Recommend we cut HCTZ in 1/2 to 12.5 MG dosing, instructed him on this + increase water intake.  Avoid alcohol or caffeine.  Labs today to include CMP, CBC, iron, ferritin.      Relevant Medications   hydrochlorothiazide (HYDRODIURIL) 25 MG tablet   Other Relevant  Orders   CBC with Differential/Platelet   Iron Binding Cap (TIBC)([facility])   Ferritin   Comprehensive metabolic panel     Other   Elevated lipase    Noted on ER labs, recheck in office today and recommend reduction of any alcohol intake at home.       Relevant Orders   Lipase   Amylase   Low hemoglobin    Noted on ER labs and present post-op, but is trending up.  Recheck CBC + check iron and ferritin.  Continue supplement at home.      Relevant Orders   CBC with Differential/Platelet   Iron Binding Cap (TIBC)([facility])   Ferritin     Follow up plan: Return in about 1 week (around 02/11/2022) for Hypotension -- with Santiago Glad or me.

## 2022-02-05 LAB — COMPREHENSIVE METABOLIC PANEL
ALT: 41 IU/L (ref 0–44)
AST: 30 IU/L (ref 0–40)
Albumin/Globulin Ratio: 1.6 (ref 1.2–2.2)
Albumin: 3.9 g/dL (ref 3.8–4.9)
Alkaline Phosphatase: 97 IU/L (ref 44–121)
BUN/Creatinine Ratio: 15 (ref 9–20)
BUN: 21 mg/dL (ref 6–24)
Bilirubin Total: <0.2 mg/dL (ref 0.0–1.2)
CO2: 21 mmol/L (ref 20–29)
Calcium: 9.6 mg/dL (ref 8.7–10.2)
Chloride: 103 mmol/L (ref 96–106)
Creatinine, Ser: 1.39 mg/dL — ABNORMAL HIGH (ref 0.76–1.27)
Globulin, Total: 2.5 g/dL (ref 1.5–4.5)
Glucose: 129 mg/dL — ABNORMAL HIGH (ref 70–99)
Potassium: 4.5 mmol/L (ref 3.5–5.2)
Sodium: 138 mmol/L (ref 134–144)
Total Protein: 6.4 g/dL (ref 6.0–8.5)
eGFR: 59 mL/min/{1.73_m2} — ABNORMAL LOW (ref >59)

## 2022-02-05 LAB — CBC WITH DIFFERENTIAL/PLATELET
Basophils Absolute: 0.1 10*3/uL (ref 0.0–0.2)
Basos: 1 %
EOS (ABSOLUTE): 0.5 10*3/uL — ABNORMAL HIGH (ref 0.0–0.4)
Eos: 5 %
Hematocrit: 30.4 % — ABNORMAL LOW (ref 37.5–51.0)
Hemoglobin: 9.9 g/dL — ABNORMAL LOW (ref 13.0–17.7)
Immature Grans (Abs): 0.1 10*3/uL (ref 0.0–0.1)
Immature Granulocytes: 1 %
Lymphocytes Absolute: 1.8 10*3/uL (ref 0.7–3.1)
Lymphs: 18 %
MCH: 27.7 pg (ref 26.6–33.0)
MCHC: 32.6 g/dL (ref 31.5–35.7)
MCV: 85 fL (ref 79–97)
Monocytes Absolute: 0.7 10*3/uL (ref 0.1–0.9)
Monocytes: 7 %
Neutrophils Absolute: 7 10*3/uL (ref 1.4–7.0)
Neutrophils: 68 %
Platelets: 481 10*3/uL — ABNORMAL HIGH (ref 150–450)
RBC: 3.58 x10E6/uL — ABNORMAL LOW (ref 4.14–5.80)
RDW: 13.7 % (ref 11.6–15.4)
WBC: 10.1 10*3/uL (ref 3.4–10.8)

## 2022-02-05 LAB — IRON AND TIBC
Iron Saturation: 9 % — CL (ref 15–55)
Iron: 31 ug/dL — ABNORMAL LOW (ref 38–169)
Total Iron Binding Capacity: 341 ug/dL (ref 250–450)
UIBC: 310 ug/dL (ref 111–343)

## 2022-02-05 LAB — FERRITIN: Ferritin: 90 ng/mL (ref 30–400)

## 2022-02-05 LAB — LIPASE: Lipase: 115 U/L — ABNORMAL HIGH (ref 13–78)

## 2022-02-05 LAB — AMYLASE: Amylase: 93 U/L (ref 31–110)

## 2022-02-07 NOTE — Progress Notes (Deleted)
PROVIDER NOTE: Interpretation of information contained herein should be left to medically-trained personnel. Specific patient instructions are provided elsewhere under "Patient Instructions" section of medical record. This document was created in part using STT-dictation technology, any transcriptional errors that may result from this process are unintentional.  Patient: Darren Allen Type: Established DOB: 06/12/1965 MRN: [id number] PCP: Jon Billings, NP  Service: Procedure DOS: 02/10/2022 Setting: Ambulatory Location: Ambulatory outpatient facility Delivery: Face-to-face Provider: Gaspar Cola, MD Specialty: Interventional Pain Management Specialty designation: 09 Location: Outpatient facility Ref. Prov.: Jon Billings, NP    Interventional Therapy      Procedure: Caudal Epidural Steroid Injection + Epidurogram   #1  Laterality: Midline         Level: Sacrococcygeal ligament  Imaging: Fluoroscopy-guided         Anesthesia: Local anesthesia (1-2% Lidocaine) Anxiolysis: None                 Sedation:                         DOS: 02/10/2022  Performed by: Gaspar Cola, MD  Purpose: Diagnostic/Therapeutic Indications: Low back and lower extremity pain severe enough to impact quality of life or function. Rationale (medical necessity): procedure needed and proper for the diagnosis and/or treatment of Darren Allen medical symptoms and needs. No diagnosis found. NAS-11 Pain score:   Pre-procedure:  /10   Post-procedure:  /10     Target: Lumbosacral epidural canal  Location: Epidural space  Region: Caudal canal Approach: Percutaneous  Type of procedure: Epidural block  Position  Prep  Materials:  Position: Prone  Prep solution: DuraPrep (Iodine Povacrylex [0.7% available iodine] and Isopropyl Alcohol, 74% w/w) Prep Area: Entire posterior lumbosacral area  Materials:  Tray: Epidural          Needle(s):  Type: Epidural  Gauge (G): 17  Length: 3.5-in   Qty: 1  Pre-op H&P Assessment:  Darren Allen is a 56 y.o. (year old), male patient, seen today for interventional treatment. He  has a past surgical history that includes Appendectomy; Incision and drainage perirectal abscess (N/A, 02/04/2015); Rectal exam under anesthesia (02/04/2015); ORIF ankle fracture (Left, 03/23/2017); Syndesmosis repair (Left, 03/23/2017); Colonoscopy with propofol (N/A, 12/31/2021); Esophagogastroduodenoscopy (N/A, 12/31/2021); Kidney stone surgery (Right); lung mass removal (N/A); Xi robotic assisted paraesophageal hernia repair (N/A, 01/20/2022); Insertion of mesh (40/03/270); and Umbilical hernia repair (01/20/2022). Darren Allen has a current medication list which includes the following prescription(s): acetaminophen, amitriptyline, ascorbic acid, ezetimibe, ferrous sulfate, fluticasone, gabapentin, hydrochlorothiazide, hydrocodone-acetaminophen, [START ON 02/26/2022] hydrocodone-acetaminophen, [START ON 03/28/2022] hydrocodone-acetaminophen, humulin 70/30 kwikpen, metformin, montelukast, naloxone, ondansetron, pantoprazole, proair hfa, rosuvastatin, tadalafil, testosterone cypionate, trelegy ellipta, triamcinolone cream, and trulicity. His primarily concern today is the No chief complaint on file.  Initial Vital Signs:  Pulse/HCG Rate:    Temp:   Resp:   BP:   SpO2:    BMI: Estimated body mass index is 28.89 kg/m as calculated from the following:   Height as of 02/03/22: '6\' 2"'$  (1.88 m).   Weight as of 02/04/22: 225 lb (102.1 kg).  Risk Assessment: Allergies: Reviewed. He is allergic to glipizide, cephalexin, and duloxetine.  Allergy Precautions: None required Coagulopathies: Reviewed. None identified.  Blood-thinner therapy: None at this time Active Infection(s): Reviewed. None identified. Darren Allen is afebrile  Site Confirmation: Darren Allen was asked to confirm the procedure and laterality before marking the site Procedure checklist: Completed Consent: Before the  procedure and under the  influence of no sedative(s), amnesic(s), or anxiolytics, the patient was informed of the treatment options, risks and possible complications. To fulfill our ethical and legal obligations, as recommended by the American Medical Association's Code of Ethics, I have informed the patient of my clinical impression; the nature and purpose of the treatment or procedure; the risks, benefits, and possible complications of the intervention; the alternatives, including doing nothing; the risk(s) and benefit(s) of the alternative treatment(s) or procedure(s); and the risk(s) and benefit(s) of doing nothing. The patient was provided information about the general risks and possible complications associated with the procedure. These may include, but are not limited to: failure to achieve desired goals, infection, bleeding, organ or nerve damage, allergic reactions, paralysis, and death. In addition, the patient was informed of those risks and complications associated to Spine-related procedures, such as failure to decrease pain; infection (i.e.: Meningitis, epidural or intraspinal abscess); bleeding (i.e.: epidural hematoma, subarachnoid hemorrhage, or any other type of intraspinal or peri-dural bleeding); organ or nerve damage (i.e.: Any type of peripheral nerve, nerve root, or spinal cord injury) with subsequent damage to sensory, motor, and/or autonomic systems, resulting in permanent pain, numbness, and/or weakness of one or several areas of the body; allergic reactions; (i.e.: anaphylactic reaction); and/or death. Furthermore, the patient was informed of those risks and complications associated with the medications. These include, but are not limited to: allergic reactions (i.e.: anaphylactic or anaphylactoid reaction(s)); adrenal axis suppression; blood sugar elevation that in diabetics may result in ketoacidosis or comma; water retention that in patients with history of congestive heart failure  may result in shortness of breath, pulmonary edema, and decompensation with resultant heart failure; weight gain; swelling or edema; medication-induced neural toxicity; particulate matter embolism and blood vessel occlusion with resultant organ, and/or nervous system infarction; and/or aseptic necrosis of one or more joints. Finally, the patient was informed that Medicine is not an exact science; therefore, there is also the possibility of unforeseen or unpredictable risks and/or possible complications that may result in a catastrophic outcome. The patient indicated having understood very clearly. We have given the patient no guarantees and we have made no promises. Enough time was given to the patient to ask questions, all of which were answered to the patient's satisfaction. Mr. Storlie has indicated that he wanted to continue with the procedure. Attestation: I, the ordering provider, attest that I have discussed with the patient the benefits, risks, side-effects, alternatives, likelihood of achieving goals, and potential problems during recovery for the procedure that I have provided informed consent. Date  Time: {CHL [facility]-PAIN TIME CHOICES:21018001}  Pre-Procedure Preparation:  Monitoring: As per clinic protocol. Respiration, ETCO2, SpO2, BP, heart rate and rhythm monitor placed and checked for adequate function Safety Precautions: Patient was assessed for positional comfort and pressure points before starting the procedure. Time-out: I initiated and conducted the "Time-out" before starting the procedure, as per protocol. The patient was asked to participate by confirming the accuracy of the "Time Out" information. Verification of the correct person, site, and procedure were performed and confirmed by me, the nursing staff, and the patient. "Time-out" conducted as per Joint Commission's Universal Protocol (UP.01.01.01). Time:    Description  Narrative of Procedure:          Procedural Technique  Safety Precautions: Aspiration looking for blood return was conducted prior to all injections. At no point did we inject any substances, as a needle was being advanced. No attempts were made at seeking any paresthesias. Safe injection practices and needle  disposal techniques used. Medications properly checked for expiration dates. SDV (single dose vial) medications used. Description of the Procedure: Protocol guidelines were followed. The patient was assisted into a comfortable position. The target area was identified and the area prepped in the usual manner. Skin & deeper tissues infiltrated with local anesthetic. Appropriate amount of time allowed to pass for local anesthetics to take effect. The procedure needles were then advanced to the target area. Proper needle placement secured. Negative aspiration confirmed. Solution injected in intermittent fashion, asking for systemic symptoms every 0.5cc of injectate. The needles were then removed and the area cleansed, making sure to leave some of the prepping solution back to take advantage of its long term bactericidal properties.  Technical description of procedure:  Safety Precautions: Aspiration looking for blood return was conducted prior to all injections. At no point did we inject any substances, as a needle was being advanced. No attempts were made at seeking any paresthesias. Safe injection practices and needle disposal techniques used. Medications properly checked for expiration dates. SDV (single dose vial) medications used. Description of the Procedure: Protocol guidelines were followed. The patient was placed in position over the fluoroscopy table. The target area was identified and the area prepped in the usual manner. Skin & deeper tissues infiltrated with local anesthetic. Appropriate amount of time allowed to pass for local anesthetics to take effect. The procedure needle was then advanced to the target area. Proper needle placement secured.  Negative aspiration confirmed.    ***   Solution injected in intermittent fashion, asking for systemic symptoms every 0.5cc of injectate. The needle/catheter were removed and the area cleansed, making sure to leave some of the prepping solution back to take advantage of its long term bactericidal properties.  There were no vitals filed for this visit.   Start Time:   hrs. End Time:   hrs.  Imaging Guidance (Spinal):          Type of Imaging Technique: Fluoroscopy Guidance (Spinal) Indication(s): Assistance in needle guidance and placement for procedures requiring needle placement in or near specific anatomical locations not easily accessible without such assistance. Exposure Time: Please see nurses notes. Contrast: Before injecting any contrast, we confirmed that the patient did not have an allergy to iodine, shellfish, or radiological contrast. Once satisfactory needle placement was completed at the desired level, radiological contrast was injected. Contrast injected under live fluoroscopy. No contrast complications. See chart for type and volume of contrast used. Fluoroscopic Guidance: I was personally present during the use of fluoroscopy. "Tunnel Vision Technique" used to obtain the best possible view of the target area. Parallax error corrected before commencing the procedure. "Direction-depth-direction" technique used to introduce the needle under continuous pulsed fluoroscopy. Once target was reached, antero-posterior, oblique, and lateral fluoroscopic projection used confirm needle placement in all planes. Images permanently stored in EMR. Interpretation: I personally interpreted the imaging intraoperatively. Adequate needle placement confirmed in multiple planes. Appropriate spread of contrast into desired area was observed. No evidence of afferent or efferent intravascular uptake. No intrathecal or subarachnoid spread observed. Permanent images saved into the patient's record.  Diagnostic  Epidurogram:  Contrast:  Type: Non-ionic, water soluble, hypoallergenic, myelogram-compatible, radiological contrast used. Please see orders and nurses note for specific choice of contrast. Volume: Please see nurses note for injected volume.  Observations:  Spinal Alignment: Adequate       Vertebral body: Intact Lamina: Intact Disc: Disc hight preserved Facet: Within Normal Limits.        Hardware:  None  Spread: Appropriate epidural spread of contrast Anterior: Adequate Posterior: Adequate Superior (cephalad): Adequate Inferior (caudad): Adequate Right lateral: Adequate Left lateral: Adequate Plica medialis dorsalis: Medially aligned Nerve root(s): Adequate  Epidural extravasation: None observed Intrathecal: No intrathecal spread identified Subarachnoid: No subarachnoid spread pattern observed Vascular: No evidence of afferent or efferent intravascular uptake  Impression: Technically successful epidurogram. See above for details Note: Electronic copies saved to PACS (EMR).  Post-operative Assessment:  Post-procedure Vital Signs:  Pulse/HCG Rate:    Temp:   Resp:   BP:   SpO2:    EBL: None  Complications: No immediate post-treatment complications observed by team, or reported by patient.  Note: The patient tolerated the entire procedure well. A repeat set of vitals were taken after the procedure and the patient was kept under observation following institutional policy, for this type of procedure. Post-procedural neurological assessment was performed, showing return to baseline, prior to discharge. The patient was provided with post-procedure discharge instructions, including a section on how to identify potential problems. Should any problems arise concerning this procedure, the patient was given instructions to immediately contact us, at any time, without hesitation. In any case, we plan to contact the patient by telephone for a follow-up status report regarding this  interventional procedure.  Comments:  No additional relevant information.  Plan of Care  Orders:  No orders of the defined types were placed in this encounter.  Chronic Opioid Analgesic:  Hydrocodone/APAP 5/325 tablet, 1 tab p.o. twice daily (#60) MME/day: 10 mg/day   Medications ordered for procedure: No orders of the defined types were placed in this encounter.  Medications administered: Corday A. Behrmann had no medications administered during this visit.  See the medical record for exact dosing, route, and time of administration.  Follow-up plan:   No follow-ups on file.       Interventional Therapies  Risk  Complexity Considerations:   Estimated body mass index is 33.91 kg/m as calculated from the following:   Height as of this encounter: 6' (1.829 m).   Weight as of this encounter: 250 lb (113.4 kg). WNL   Planned  Pending:      Under consideration:   Diagnostic bilateral lumbar facet MBB #1  Diagnostic/therapeutic left L5 & S1 TFESI #1  Possible spinal cord stimulator trial  Therapeutic left L5-S1 percutaneous discectomy with "[employer] Dekompressor" system    Completed:   Diagnostic left L5-S1 LESI x1 (12/16/2021) (100/100/100/LBP:85  LEP:100)  Therapeutic bilateral Qutenza neurolytic treatment x1 (12/23/2021)  (09/08/2021 & 10/29/2021) referral to physical therapy for evaluation and treatment of low back pain.   Completed by other providers:   EMG/PNCV of lower extremity (02/20/2020) by Dr. Jennings Books (generalized sensorimotor peripheral neuropathy; superimposed left S1 radiculopathy)   Therapeutic  Palliative (PRN) options:   None established     Recent Visits Date Type Provider Dept  01/13/22 Office Visit Milinda Pointer, MD [facility]  12/23/21 Procedure visit Milinda Pointer, MD [facility]  12/16/21 Procedure visit Milinda Pointer, MD [facility]  11/26/21 Office Visit Milinda Pointer, MD [facility]  Showing recent visits within past 90 days and meeting all other requirements Future Appointments Date Type Provider Dept  02/10/22 Appointment Milinda Pointer, [facility]  04/20/22 Appointment Milinda Pointer, MD [facility]  Showing future appointments within next 90 days and meeting all other requirements  Disposition: Discharge home  Discharge (Date  Time): 02/10/2022;   hrs.   Primary Care Physician:  Jon Billings, NP Location: [facility] Note by: Gaspar Cola, MD Date: 02/10/2022; Time: 2:02 PM  Disclaimer:  Medicine is not an Chief Strategy Officer. The only guarantee in medicine is that nothing is guaranteed. It is important to note that the decision to proceed with this intervention was based on the information collected from the patient. The Data and conclusions were drawn from the patient's questionnaire, the interview, and the physical examination. Because the information was provided in large part by the patient, it cannot be guaranteed that it has not been purposely or unconsciously manipulated. Every effort has been made to obtain as much relevant data as possible for this evaluation. It is important to note that the conclusions that lead to this procedure are derived in large part from the available data. Always take into account that the treatment will also be dependent on availability of resources and existing treatment guidelines, considered by other Pain Management Practitioners as being common knowledge and practice, at the time of the intervention. For Medico-Legal purposes, it is also important to point out that variation in procedural techniques and pharmacological choices are the acceptable norm. The indications, contraindications, technique, and results of the above procedure should only be interpreted and judged by a Board-Certified Interventional Pain Specialist with extensive familiarity and expertise  in the same exact procedure and technique.

## 2022-02-07 NOTE — Progress Notes (Signed)
Contacted via [place] -- although please check to ensure he received message (please call him): Good morning Jamarien, your labs have returned: - CBC continues to show some anemia with low hemoglobin and hematocrit - a little lower this check -- plus iron level low at 30.  Please start taking iron supplement daily.  I recommend grabbing some Slow Fe or Vitron over the counter and taking on tablet daily for now while recovering from surgery.  We will recheck levels in about 6-8 weeks. - Kidney function, creatinine and eGFR, shows mild kidney disease stage 3a, which was seen on previous labs.  Ensure drinking good water intake daily.  Liver function, AST and ALT, normal.   - Pancrease labs, Amylase and Lipase, continues to show some elevation in lipase with slight trend up.  Please ensure avoid alcohol intake and try to stick to a bland diet for now while recovering.  No greasy or heavy food.  Any questions? Keep being amazing!!  Thank you for allowing me to participate in your care.  I appreciate you. Kindest regards, Boykin Baetz

## 2022-02-08 LAB — CULTURE, BLOOD (ROUTINE X 2)
Culture: NO GROWTH
Culture: NO GROWTH
Special Requests: ADEQUATE

## 2022-02-10 ENCOUNTER — Ambulatory Visit: Payer: HMO | Admitting: Pain Medicine

## 2022-02-15 ENCOUNTER — Emergency Department: Payer: HMO

## 2022-02-15 DIAGNOSIS — R0602 Shortness of breath: Secondary | ICD-10-CM | POA: Insufficient documentation

## 2022-02-15 DIAGNOSIS — Z7951 Long term (current) use of inhaled steroids: Secondary | ICD-10-CM | POA: Insufficient documentation

## 2022-02-15 DIAGNOSIS — J449 Chronic obstructive pulmonary disease, unspecified: Secondary | ICD-10-CM | POA: Insufficient documentation

## 2022-02-15 DIAGNOSIS — Z1152 Encounter for screening for COVID-19: Secondary | ICD-10-CM | POA: Insufficient documentation

## 2022-02-15 DIAGNOSIS — Z794 Long term (current) use of insulin: Secondary | ICD-10-CM | POA: Insufficient documentation

## 2022-02-15 DIAGNOSIS — M25562 Pain in left knee: Secondary | ICD-10-CM | POA: Diagnosis present

## 2022-02-15 DIAGNOSIS — M79605 Pain in left leg: Secondary | ICD-10-CM | POA: Insufficient documentation

## 2022-02-15 DIAGNOSIS — E119 Type 2 diabetes mellitus without complications: Secondary | ICD-10-CM | POA: Insufficient documentation

## 2022-02-15 DIAGNOSIS — Z79899 Other long term (current) drug therapy: Secondary | ICD-10-CM | POA: Diagnosis not present

## 2022-02-15 DIAGNOSIS — I10 Essential (primary) hypertension: Secondary | ICD-10-CM | POA: Diagnosis not present

## 2022-02-15 DIAGNOSIS — Z7984 Long term (current) use of oral hypoglycemic drugs: Secondary | ICD-10-CM | POA: Diagnosis not present

## 2022-02-15 DIAGNOSIS — M7989 Other specified soft tissue disorders: Secondary | ICD-10-CM | POA: Insufficient documentation

## 2022-02-15 LAB — COMPREHENSIVE METABOLIC PANEL
ALT: 25 U/L (ref 0–44)
AST: 25 U/L (ref 15–41)
Albumin: 3.9 g/dL (ref 3.5–5.0)
Alkaline Phosphatase: 71 U/L (ref 38–126)
Anion gap: 7 (ref 5–15)
BUN: 16 mg/dL (ref 6–20)
CO2: 27 mmol/L (ref 22–32)
Calcium: 9.2 mg/dL (ref 8.9–10.3)
Chloride: 104 mmol/L (ref 98–111)
Creatinine, Ser: 1.31 mg/dL — ABNORMAL HIGH (ref 0.61–1.24)
GFR, Estimated: >60 mL/min (ref >60)
Glucose, Bld: 175 mg/dL — ABNORMAL HIGH (ref 70–99)
Potassium: 3.6 mmol/L (ref 3.5–5.1)
Sodium: 138 mmol/L (ref 135–145)
Total Bilirubin: 0.8 mg/dL (ref 0.3–1.2)
Total Protein: 7.2 g/dL (ref 6.5–8.1)

## 2022-02-15 LAB — RESP PANEL BY RT-PCR (FLU A&B, COVID) ARPGX2
Influenza A by PCR: NEGATIVE
Influenza B by PCR: NEGATIVE
SARS Coronavirus 2 by RT PCR: NEGATIVE

## 2022-02-15 LAB — CBC
HCT: 33.4 % — ABNORMAL LOW (ref 39.0–52.0)
Hemoglobin: 10.6 g/dL — ABNORMAL LOW (ref 13.0–17.0)
MCH: 27.1 pg (ref 26.0–34.0)
MCHC: 31.7 g/dL (ref 30.0–36.0)
MCV: 85.4 fL (ref 80.0–100.0)
Platelets: 289 10*3/uL (ref 150–400)
RBC: 3.91 MIL/uL — ABNORMAL LOW (ref 4.22–5.81)
RDW: 14.1 % (ref 11.5–15.5)
WBC: 6.6 10*3/uL (ref 4.0–10.5)
nRBC: 0 % (ref 0.0–0.2)

## 2022-02-15 LAB — D-DIMER, QUANTITATIVE: D-Dimer, Quant: 0.6 ug/mL-FEU — ABNORMAL HIGH (ref 0.00–0.50)

## 2022-02-15 NOTE — ED Provider Triage Note (Signed)
Emergency Medicine Provider Triage Evaluation Note  Darren Allen, a 56 y.o. male  was evaluated in triage.  Pt complains of sensation to his left leg that began yesterday.  He denies any preceding injury or trauma.  He reports discomfort medial thigh to the medial aspect of the lower shin.  He does endorse pain began at the knee joint.  He denies any redness or swelling in the leg.  He also reports of intermittent shortness of breath associated chest pain.  Patient had a open retropharyngeal and umbilical hernia repair on November 9.  Review of Systems  Positive: LLE pain, CP, SOB Negative: FCS  Physical Exam  BP (!) 150/99 (BP Location: Left Arm)   Pulse (!) 105   Temp 98.9 F (37.2 C) (Oral)   Resp 17   Ht '6\' 2"'$  (1.88 m)   Wt 102.1 kg   SpO2 98%   BMI 28.89 kg/m  Gen:   Awake, no distress  NAD Resp:  Normal effort  MSK:   Moves extremities without difficulty  Other:    Medical Decision Making  Medically screening exam initiated at 10:08 PM.  Appropriate orders placed.  Norm A Pozzi was informed that the remainder of the evaluation will be completed by another provider, this initial triage assessment does not replace that evaluation, and the importance of remaining in the ED until their evaluation is complete.  To the ED for evaluation of left leg pain that began yesterday without injury, as well as some intermittent episodes of shortness of breath and chest pain.  Patient is about 4 weeks status post retropharyngeal and umbilical hernia repair.   Melvenia Needles, PA-C 02/15/22 2209

## 2022-02-15 NOTE — ED Triage Notes (Signed)
Pt reports left leg pain (burning) that started yesterday w/o injury. Pain from medial thigh to LLE, pain started at the knee. No redness or swelling noted. Pt is ambulatory to triage w/steady gait. Pt also reports SOB w/o CP. Pt reports hernia sx Nov 9. VSS. GCS 15.

## 2022-02-16 ENCOUNTER — Emergency Department: Payer: HMO

## 2022-02-16 ENCOUNTER — Emergency Department
Admission: EM | Admit: 2022-02-16 | Discharge: 2022-02-16 | Disposition: A | Discharge status: Home / Self Care | Payer: HMO | Attending: Emergency Medicine | Admitting: Emergency Medicine

## 2022-02-16 DIAGNOSIS — M79605 Pain in left leg: Secondary | ICD-10-CM

## 2022-02-16 DIAGNOSIS — R0602 Shortness of breath: Secondary | ICD-10-CM

## 2022-02-16 LAB — TROPONIN I (HIGH SENSITIVITY)
Troponin I (High Sensitivity): 3 ng/L (ref <18)
Troponin I (High Sensitivity): 3 ng/L (ref <18)

## 2022-02-16 MED ORDER — IOHEXOL 350 MG/ML SOLN
100.0000 mL | Freq: Once | INTRAVENOUS | Status: AC | PRN
  Administered 2022-02-16: 100 mL via INTRAVENOUS

## 2022-02-16 MED ORDER — SODIUM CHLORIDE 0.9 % IV BOLUS (SEPSIS)
1000.0000 mL | Freq: Once | INTRAVENOUS | Status: AC
  Administered 2022-02-16: 1000 mL via INTRAVENOUS

## 2022-02-16 NOTE — ED Provider Notes (Signed)
[facility] Provider Note    Event Date/Time   First MD Initiated Contact with Patient 02/16/22 [phone]     (approximate)   History   Leg Pain   HPI  Darren Allen is a 56 y.o. male with history of diabetes, hypertension, COPD who presents emergency department with left medial knee and calf pain.  No known injury that he can recall.  Painful to walk.  Has noticed some swelling in this area but no redness, warmth.  No fever.  Patient reports he did have some intermittent chest pain and shortness of breath that has resolved.  No known history of PE, DVT.  No fevers, cough.  Did recently have surgery.   History provided by patient and wife.    Past Medical History:  Diagnosis Date   Allergy    Calculus of kidney 02/04/2015   COPD (chronic obstructive pulmonary disease) (HCC)    Diabetes mellitus without complication ([place])    type 2   Hypertension    Neuropathy     Past Surgical History:  Procedure Laterality Date   APPENDECTOMY     COLONOSCOPY WITH PROPOFOL N/A 12/31/2021   Procedure: COLONOSCOPY WITH PROPOFOL;  Surgeon: Jonathon Bellows, MD;  Location: [facility] ENDOSCOPY;  Service: Gastroenterology;  Laterality: N/A;   ESOPHAGOGASTRODUODENOSCOPY N/A 12/31/2021   Procedure: ESOPHAGOGASTRODUODENOSCOPY (EGD);  Surgeon: Jonathon Bellows, MD;  Location: [facility] ENDOSCOPY;  Service: Gastroenterology;  Laterality: N/A;   INCISION AND DRAINAGE PERIRECTAL ABSCESS N/A 02/04/2015   Procedure: IRRIGATION AND DEBRIDEMENT PERIRECTAL ABSCESS;  Surgeon: Marlyce Huge, MD;  Location: [facility] ORS;  Service: General;  Laterality: N/A;   INSERTION OF MESH  01/20/2022   Procedure: INSERTION OF MESH;  Surgeon: Jules Husbands, MD;  Location: [facility] ORS;  Service: General;;   KIDNEY STONE SURGERY Right    lung mass removal N/A    ORIF ANKLE FRACTURE Left 03/23/2017   Procedure: OPEN REDUCTION INTERNAL FIXATION (ORIF) ANKLE FRACTURE;  Surgeon: Leim Fabry, MD;  Location: [facility] ORS;   Service: Orthopedics;  Laterality: Left;   RECTAL EXAM UNDER ANESTHESIA  02/04/2015   Procedure: RECTAL EXAM UNDER ANESTHESIA;  Surgeon: Marlyce Huge, MD;  Location: [facility] ORS;  Service: General;;   SYNDESMOSIS REPAIR Left 03/23/2017   Procedure: SYNDESMOSIS REPAIR;  Surgeon: Leim Fabry, MD;  Location: [facility] ORS;  Service: Orthopedics;  Laterality: Left;   UMBILICAL HERNIA REPAIR  01/20/2022   Procedure: HERNIA REPAIR UMBILICAL ADULT;  Surgeon: Jules Husbands, MD;  Location: [facility] ORS;  Service: General;;   XI ROBOTIC ASSISTED PARAESOPHAGEAL HERNIA REPAIR N/A 01/20/2022   Procedure: XI ROBOTIC ASSISTED PARAESOPHAGEAL HERNIA REPAIR, CONVERTED TO OPEN, RNFA to assist;  Surgeon: Jules Husbands, MD;  Location: [facility] ORS;  Service: General;  Laterality: N/A;    MEDICATIONS:  Prior to Admission medications   Medication Sig Start Date End Date Taking? Authorizing Provider  acetaminophen (TYLENOL) 500 MG tablet Take 2 tablets (1,000 mg total) by mouth every 8 (eight) hours. 03/24/17   Reche Dixon, PA-C  amitriptyline (ELAVIL) 10 MG tablet Take 30 mg by mouth at bedtime.    [provider]  ascorbic acid (VITAMIN C) 500 MG tablet Take by mouth daily.    [provider]  ezetimibe (ZETIA) 10 MG tablet Take 10 mg by mouth daily. 11/04/21   [provider]  ferrous sulfate 325 (65 FE) MG tablet Take 325 mg by mouth daily with breakfast.    [provider]  fluticasone (FLONASE) 50 MCG/ACT nasal  spray Place into both nostrils. 07/02/21   [provider]  gabapentin (NEURONTIN) 600 MG tablet Take 600 mg by mouth 3 (three) times daily. 03/26/21   [provider]  hydrochlorothiazide (HYDRODIURIL) 25 MG tablet Take 0.5 tablets (12.5 mg total) by mouth daily. 02/04/22   Cannady, Henrine Screws T, NP  HYDROcodone-acetaminophen (NORCO/VICODIN) 5-325 MG tablet Take 1 tablet by mouth 2 (two) times daily as needed. Must last 30 days. 01/27/22 02/26/22  Milinda Pointer, MD  HYDROcodone-acetaminophen (NORCO/VICODIN) 5-325 MG tablet Take 1 tablet by mouth 2 (two) times daily as needed. Must last 30 days. 02/26/22 03/28/22  Milinda Pointer, MD  HYDROcodone-acetaminophen (NORCO/VICODIN) 5-325 MG tablet Take 1 tablet by mouth 2 (two) times daily as needed. Must last 30 days. 03/28/22 04/27/22  Milinda Pointer, MD  insulin isophane & regular human (HUMULIN 70/30 KWIKPEN) (70-30) 100 UNIT/ML KwikPen Inject 58 Units into the skin 2 (two) times daily with a meal. 58 with meals and 54 qhs 10/14/19   [provider]  metFORMIN (GLUCOPHAGE-XR) 500 MG 24 hr tablet TAKE 4 TABLETS(2000 MG) BY MOUTH DAILY 12/16/21   Jon Billings, NP  montelukast (SINGULAIR) 10 MG tablet Take 10 mg by mouth daily. 07/02/21   [provider]  naloxone [facility]) nasal spray 4 mg/0.1 mL Place 1 spray into the nose as needed for up to 365 doses (for opioid-induced respiratory depresssion). In case of emergency (overdose), spray once into each nostril. If no response within 3 minutes, repeat application and call [phone]. 01/13/22 01/13/23  Milinda Pointer, MD  ondansetron (ZOFRAN) 4 MG tablet Take 1 tablet (4 mg total) by mouth every 8 (eight) hours as needed for nausea or vomiting. 02/02/22   Pabon, Diego F, MD  pantoprazole (PROTONIX) 40 MG tablet Take 2 tablets (80 mg total) by mouth daily. 02/03/22   Johnson, Megan P, DO  PROAIR HFA 108 (90 Base) MCG/ACT inhaler Inhale 2 puffs into the lungs every 6 (six) hours as needed. 12/28/20   [provider]  rosuvastatin (CRESTOR) 40 MG tablet Take 40 mg by mouth daily. 09/30/20   [provider]  tadalafil (CIALIS) 5 MG tablet Take 1 tablet (5 mg total) by mouth daily as needed for erectile dysfunction. 01/07/22   Zara Council A, PA-C  testosterone cypionate (DEPOTESTOSTERONE CYPIONATE) 200 MG/ML injection Inject 0.5 mLs (100 mg total) into the muscle every 14 (fourteen) days. 11/10/21   Zara Council A,  PA-C  TRELEGY ELLIPTA 200-62.5-25 MCG/INH AEPB Inhale 1 puff into the lungs daily. 10/10/20   [provider]  triamcinolone cream (KENALOG) 0.1 % Apply 1 application  topically 2 (two) times daily. 08/25/21   Jon Billings, NP  TRULICITY 3 RS/8.5IO SOPN Inject 3 mg into the skin once a week. 10/28/21   [provider]    Physical Exam   Triage Vital Signs: ED Triage Vitals  Enc Vitals Group     BP 02/15/22 1909 (!) 150/99     Pulse Rate 02/15/22 1909 (!) 105     Resp 02/15/22 1909 17     Temp 02/15/22 1909 98.9 F (37.2 C)     Temp Source 02/15/22 1909 Oral     SpO2 02/15/22 1909 98 %     Weight 02/15/22 1910 225 lb (102.1 kg)     Height 02/15/22 1910 '6\' 2"'$  (1.88 m)     Head Circumference --      Peak Flow --      Pain Score 02/15/22 1910 3  Pain Loc --      Pain Edu? --      Excl. in [place]? --     Most recent vital signs: Vitals:   02/15/22 1909 02/15/22 2325  BP: (!) 150/99 (!) 159/93  Pulse: (!) 105 98  Resp: 17 20  Temp: 98.9 F (37.2 C) 97.7 F (36.5 C)  SpO2: 98% 97%    CONSTITUTIONAL: Alert and oriented and responds appropriately to questions. Well-appearing; well-nourished HEAD: Normocephalic, atraumatic EYES: Conjunctivae clear, pupils appear equal, sclera nonicteric ENT: normal nose; moist mucous membranes NECK: Supple, normal ROM CARD: RRR; S1 and S2 appreciated; no murmurs, no clicks, no rubs, no gallops RESP: Normal chest excursion without splinting or tachypnea; breath sounds clear and equal bilaterally; no wheezes, no rhonchi, no rales, no hypoxia or respiratory distress, speaking full sentences ABD/GI: Normal bowel sounds; non-distended; soft, non-tender, no rebound, no guarding, no peritoneal signs BACK: The back appears normal EXT: Minimal amount of soft tissue swelling noted around the medial left knee but no redness or warmth.  No ligamentous laxity on exam.  No bony tenderness or bony deformity.  No calf tenderness or calf  swelling.  He has a 2+ left DP pulse and normal capillary refill.  Compartments soft. SKIN: Normal color for age and race; warm; no rash on exposed skin NEURO: Moves all extremities equally, normal speech PSYCH: The patient's mood and manner are appropriate.   ED Results / Procedures / Treatments   LABS: (all labs ordered are listed, but only abnormal results are displayed) Labs Reviewed  COMPREHENSIVE METABOLIC PANEL - Abnormal; Notable for the following components:      Result Value   Glucose, Bld 175 (*)    Creatinine, Ser 1.31 (*)    All other components within normal limits  CBC - Abnormal; Notable for the following components:   RBC 3.91 (*)    Hemoglobin 10.6 (*)    HCT 33.4 (*)    All other components within normal limits  D-DIMER, QUANTITATIVE - Abnormal; Notable for the following components:   D-Dimer, Quant 0.60 (*)    All other components within normal limits  RESP PANEL BY RT-PCR (FLU A&B, COVID) ARPGX2  TROPONIN I (HIGH SENSITIVITY)  TROPONIN I (HIGH SENSITIVITY)  TROPONIN I (HIGH SENSITIVITY)     EKG:  EKG Interpretation  Date/Time:  Sunday February 15 2022 19:11:30 EST Ventricular Rate:  103 PR Interval:  164 QRS Duration: 96 QT Interval:  350 QTC Calculation: 458 R Axis:   77 Text Interpretation: Sinus tachycardia Otherwise normal ECG When compared with ECG of 03-Feb-2022 14:21, PREVIOUS ECG IS PRESENT Confirmed by Pryor Curia [id number]) on 02/16/2022 12:55:16 AM         RADIOLOGY: My personal review and interpretation of imaging: X-ray of the left knee is unremarkable.  Venous Doppler negative for DVT.  Chest x-ray clear.  CTA shows no PE.  I have personally reviewed all radiology reports.   CT Angio Chest PE W and/or Wo Contrast  Result Date: 02/16/2022 CLINICAL DATA:  Left leg pain and shortness of breath. EXAM: CT ANGIOGRAPHY CHEST WITH CONTRAST TECHNIQUE: Multidetector CT imaging of the chest was performed using the standard protocol during  bolus administration of intravenous contrast. Multiplanar CT image reconstructions and MIPs were obtained to evaluate the vascular anatomy. RADIATION DOSE REDUCTION: This exam was performed according to the departmental dose-optimization program which includes automated exposure control, adjustment of the mA and/or kV according to patient size and/or use of iterative reconstruction technique.  CONTRAST:  124m OMNIPAQUE IOHEXOL 350 MG/ML SOLN COMPARISON:  August 18, 2021 FINDINGS: Cardiovascular: Moderate severity atherosclerosis is seen along the aortic arch, without evidence of aortic aneurysm or dissection. Satisfactory opacification of the pulmonary arteries to the segmental level. No evidence of pulmonary embolism. Normal heart size with mild to moderate severity coronary artery calcification. No pericardial effusion. Mediastinum/Nodes: No enlarged mediastinal, hilar, or axillary lymph nodes. Thyroid gland, trachea, and esophagus demonstrate no significant findings. Lungs/Pleura: Mild-to-moderate severity bilateral upper lobe paraseptal and centrilobular emphysematous lung disease is seen, right greater than left. Mild lingular and mild posterior bilateral lower lobe atelectatic changes are seen. There is no evidence of an acute infiltrate, pleural effusion or pneumothorax. Upper Abdomen: There has been interval paraesophageal hernia since the prior study. A 5.3 cm x 1.4 cm x 2.8 cm area of fluid and air is seen posterior to the expected region of the esophageal hiatus. Multiple adjacent surgical clips and a mild to moderate amount of associated mesenteric inflammatory fat stranding is noted. Musculoskeletal: No chest wall abnormality. No acute or significant osseous findings. Review of the MIP images confirms the above findings. IMPRESSION: 1. No evidence of pulmonary embolism. 2. Mild lingular and mild posterior bilateral lower lobe atelectatic changes. 3. Mild-to-moderate severity bilateral upper lobe paraseptal  and centrilobular emphysematous lung disease. 4. Interval paraesophageal hernia repair with a 5.3 cm x 1.4 cm x 2.8 cm complex postoperative fluid collection versus abscess seen posterior to the expected region of the esophageal hiatus. 5. Aortic atherosclerosis. Aortic Atherosclerosis (ICD10-I70.0) and Emphysema (ICD10-J43.9). Electronically Signed   By: TVirgina NorfolkM.D.   On: 02/16/2022 02:57   DG Knee Complete 4 Views Left  Result Date: 02/16/2022 CLINICAL DATA:  Left leg pain EXAM: LEFT KNEE - COMPLETE 4+ VIEW COMPARISON:  None Available. FINDINGS: No evidence of fracture, dislocation, or joint effusion. No evidence of arthropathy or other focal bone abnormality. Soft tissues are unremarkable. IMPRESSION: Negative. Electronically Signed   By: KRolm BaptiseM.D.   On: 02/16/2022 01:16   UKoreaVenous Img Lower Unilateral Left (DVT)  Result Date: 02/15/2022 CLINICAL DATA:  Pain EXAM: Left LOWER EXTREMITY VENOUS DOPPLER ULTRASOUND TECHNIQUE: Gray-scale sonography with compression, as well as color and duplex ultrasound, were performed to evaluate the deep venous system(s) from the level of the common femoral vein through the popliteal and proximal calf veins. COMPARISON:  None Available. FINDINGS: VENOUS Normal compressibility of the common femoral, superficial femoral, and popliteal veins, as well as the visualized calf veins. Visualized portions of profunda femoral vein and great saphenous vein unremarkable. No filling defects to suggest DVT on grayscale or color Doppler imaging. Doppler waveforms show normal direction of venous flow, normal respiratory plasticity and response to augmentation. Limited views of the contralateral common femoral vein are unremarkable. OTHER There is 1 cm complex fluid collection along the medial aspect of left knee. Limitations: none IMPRESSION: There is no evidence of deep venous thrombosis in left lower extremity. There is 1 cm complex fluid collection along the medial  aspect of left knee, possibly joint effusion. Electronically Signed   By: PElmer PickerM.D.   On: 02/15/2022 20:19   DG Chest 2 View  Result Date: 02/15/2022 CLINICAL DATA:  Shortness of breath. EXAM: CHEST - 2 VIEW COMPARISON:  Chest radiograph dated 02/03/2022. FINDINGS: Mild diffuse interstitial coarsening may be chronic. Edema or atypical pneumonia are less likely but not excluded. Linear atelectasis/scarring in the lingula. No consolidation, pleural effusion, or pneumothorax. The cardiac silhouette is  within limits. No acute osseous pathology. IMPRESSION: 1. No acute cardiopulmonary process. 2. Mild diffuse interstitial coarsening may be chronic. Electronically Signed   By: Anner Crete M.D.   On: 02/15/2022 19:53     PROCEDURES:  Critical Care performed: No    .1-3 Lead EKG Interpretation  Performed by: Punam Broussard, Delice Bison, DO Authorized by: Maximillion Gill, Delice Bison, DO     Interpretation: normal     ECG rate:  98   ECG rate assessment: normal     Rhythm: sinus rhythm     Ectopy: none     Conduction: normal       IMPRESSION / MDM / ASSESSMENT AND PLAN / ED COURSE  I reviewed the triage vital signs and the nursing notes.    Patient here with complaints of left medial knee pain that goes down the leg and intermittent chest pain and shortness of breath.  Recent abdominal surgery.  The patient is on the cardiac monitor to evaluate for evidence of arrhythmia and/or significant heart rate changes.   DIFFERENTIAL DIAGNOSIS (includes but not limited to):   PE, ACS, COPD, viral URI, DVT, meniscal injury, arthritis, joint effusion, no sign of compartment syndrome, septic arthritis, gout or fracture   Patient's presentation is most consistent with acute presentation with potential threat to life or bodily function.   PLAN: Workup initiated from triage.  No leukocytosis.  Stable anemia.  Stable chronic kidney disease.  D-dimer slightly elevated at 0.6.  Will add on troponin x 2.   EKG nonischemic.  Chest x-ray reviewed and interpreted by myself and the radiologist and shows no acute abnormality.  COVID and flu negative.  DVT ultrasound of the left leg and x-ray of the left knee reviewed and interpreted by myself and the radiologist and shows a fluid collection at the medial aspect of the left knee that could be a joint effusion.  He does appear to have some soft tissue swelling but there is no fluctuance, redness or warmth to suggest an abscess.  Will obtain CTA of the chest to rule out PE.  He declines any pain medication here stating he receives pain medication regularly as an outpatient.  Will wrap his knee with an Ace wrap and recommended ice, elevation.   MEDICATIONS GIVEN IN ED: Medications  sodium chloride 0.9 % bolus 1,000 mL (1,000 mLs Intravenous New Bag/Given 02/16/22 0212)  iohexol (OMNIPAQUE) 350 MG/ML injection 100 mL (100 mLs Intravenous Contrast Given 02/16/22 0230)     ED COURSE: Troponin x2 negative.  CTA of the chest reviewed and interpreted by myself and the radiologist and shows no PE, pneumonia.  He does have severe emphysematous changes.  He does have a 5.3 cm x 1.4 cm x 2.8 cm postoperative fluid collection likely representing a seroma versus abscess posterior to the esophageal hiatus from his recent paraesophageal hernia repair.  Low suspicion that this is infectious in nature.  He has no fever, leukocytosis and has no complaints of abdominal pain or chest pain at this time.  We discussed this finding and recommended close follow-up with his surgeon.  I feel he is safe for discharge home with further outpatient management.  Will give orthopedic follow-up for further evaluation of his knee pain.  Reiterated rest, elevation, ice and compression.   At this time, I do not feel there is any life-threatening condition present. I reviewed all nursing notes, vitals, pertinent previous records.  All lab and urine results, EKGs, imaging ordered have been  independently reviewed  and interpreted by myself.  I reviewed all available radiology reports from any imaging ordered this visit.  Based on my assessment, I feel the patient is safe to be discharged home without further emergent workup and can continue workup as an outpatient as needed. Discussed all findings, treatment plan as well as usual and customary return precautions.  They verbalize understanding and are comfortable with this plan.  Outpatient follow-up has been provided as needed.  All questions have been answered.    CONSULTS: Patient appropriate for further outpatient management.   OUTSIDE RECORDS REVIEWED: Reviewed last pulmonology note on 01/25/2022.       FINAL CLINICAL IMPRESSION(S) / ED DIAGNOSES   Final diagnoses:  Left leg pain  Shortness of breath     Rx / DC Orders   ED Discharge Orders     None        Note:  This document was prepared using Dragon voice recognition software and may include unintentional dictation errors.   Dewight Catino, Delice Bison, DO 02/16/22 [phone]

## 2022-02-16 NOTE — Discharge Instructions (Addendum)
Please continue your pain medication as prescribed.  I recommend keeping your leg in an Ace wrap or you may purchase an over-the-counter knee sleeve for compression.  I recommend rest, elevation and ice.  Your ultrasound showed no DVT and your x-ray showed no acute abnormality.  If you continue to have pain in your knee, recommend follow-up with your PCP and/or orthopedics.  Your cardiac work-up showed normal cardiac labs, no significant EKG changes and CT scan that showed no pneumonia or blood clot.   You do have a fluid collection around your recent surgical site below suspicion that this is infection but if you develop abdominal pain, fever, I recommend close follow-up with your surgeon.

## 2022-02-17 ENCOUNTER — Telehealth: Payer: Self-pay | Admitting: *Deleted

## 2022-02-17 ENCOUNTER — Encounter: Payer: Self-pay | Admitting: Nurse Practitioner

## 2022-02-17 NOTE — Telephone Encounter (Signed)
Transition Care Management Unsuccessful Follow-up Telephone Call  Date of discharge and from where:  02-16-2022 [facility]   Attempts:  1st Attempt  Reason for unsuccessful TCM follow-up call:  Left voice message

## 2022-02-18 NOTE — Telephone Encounter (Signed)
Transition Care Management Unsuccessful Follow-up Telephone Call  Date of discharge and from where:  [place] 112-07-2021  Attempts:  2nd Attempt  Reason for unsuccessful TCM follow-up call:  Left voice message

## 2022-02-19 NOTE — Progress Notes (Signed)
BP 127/83   Pulse (!) 110   Temp 98.3 F (36.8 C) (Oral)   Wt 229 lb 8 oz (104.1 kg)   SpO2 97%   BMI 29.47 kg/m    Subjective:    Patient ID: Darren Allen, male    DOB: 03/21/1965, 56 y.o.   MRN: [id number]  HPI: Darren Allen is a 56 y.o. male presenting on 02/20/2022 for comprehensive medical examination. Current medical complaints include:none  He currently lives with: Interim Problems from his last visit: no  HYPERTENSION / HYPERLIPIDEMIA Satisfied with current treatment? no Duration of hypertension: years BP monitoring frequency: not checking BP range:  BP medication side effects: no Past BP meds: Metoprolol and lisinopril, HCTZ Duration of hyperlipidemia: years Cholesterol medication side effects: no Cholesterol supplements: none Past cholesterol medications: rosuvastatin (crestor) Medication compliance: excellent compliance Aspirin: no Recent stressors: no Recurrent headaches: no Visual changes: no Palpitations: no Dyspnea: no Chest pain: no Lower extremity edema: no Dizzy/lightheaded: no  DIABETES Also in Trulicity. Hypoglycemic episodes:no Polydipsia/polyuria: no Visual disturbance: no Chest pain: no Paresthesias: no Glucose Monitoring: yes  Accucheck frequency: continuous  Fasting glucose: 85-190  Post prandial:  Evening:  Before meals: Taking Insulin?: yes  Long acting insulin: 56 in the am 52 in the pm  Short acting insulin:  Blood Pressure Monitoring: not checking Retinal Examination: has it scheduled for october Foot Exam: Up to Date Diabetic Education: Not Completed Pneumovax: Up to Date Influenza: Not up to Date Aspirin: no  COPD COPD status: uncontrolled Satisfied with current treatment?: no Oxygen use: no Dyspnea frequency: yes Cough frequency: yes Rescue inhaler frequency: 3x daily Limitation of activity: yes Productive cough: yes Last Spirometry:  Pneumovax: Up to Date Influenza: Not up to Date See's  Pulmonology  Functional Status Survey:    FALL RISK:    02/04/2022   11:03 AM 01/28/2022    4:15 PM 01/13/2022   12:54 PM 12/16/2021    9:01 AM 11/26/2021    9:41 AM  [place] in the past year? 0 0 0 0 0  Number falls in past yr: 0 0   0  Injury with Fall? 0 0     Risk for fall due to : No Fall Risks No Fall Risks   No Fall Risks  Follow up Falls evaluation completed Falls evaluation completed   Falls evaluation completed    Depression Screen    02/04/2022   11:03 AM 01/28/2022    4:15 PM 12/16/2021    9:01 AM 10/29/2021   11:00 AM 10/23/2021    8:07 AM  Depression screen PHQ 2/9  Decreased Interest 2 2 0 0 1  Down, Depressed, Hopeless 0 0 0 0 0  PHQ - 2 Score 2 2 0 0 1  Altered sleeping 0 2   1  Tired, decreased energy _0 Change in appetite 0 0   1  Feeling bad or failure about yourself  0 0   0  Trouble concentrating 0 0   0  Moving slowly or fidgety/restless 0 0   0  Suicidal thoughts 0 0   0  PHQ-9 Score _1 Difficult doing work/chores Not difficult at all Very difficult   Somewhat difficult    Advanced Directives Does not have one yet.  Will accept information today.  Past Medical History:  Past Medical History:  Diagnosis Date   Allergy    Calculus of kidney  02/04/2015   COPD (chronic obstructive pulmonary disease) (HCC)    Diabetes mellitus without complication ([place])    type 2   Hypertension    Neuropathy     Surgical History:  Past Surgical History:  Procedure Laterality Date   APPENDECTOMY     COLONOSCOPY WITH PROPOFOL N/A 12/31/2021   Procedure: COLONOSCOPY WITH PROPOFOL;  Surgeon: Jonathon Bellows, MD;  Location: [facility] ENDOSCOPY;  Service: Gastroenterology;  Laterality: N/A;   ESOPHAGOGASTRODUODENOSCOPY N/A 12/31/2021   Procedure: ESOPHAGOGASTRODUODENOSCOPY (EGD);  Surgeon: Jonathon Bellows, MD;  Location: [facility] ENDOSCOPY;  Service: Gastroenterology;  Laterality: N/A;   INCISION AND DRAINAGE PERIRECTAL ABSCESS N/A 02/04/2015    Procedure: IRRIGATION AND DEBRIDEMENT PERIRECTAL ABSCESS;  Surgeon: Marlyce Huge, MD;  Location: [facility] ORS;  Service: General;  Laterality: N/A;   INSERTION OF MESH  01/20/2022   Procedure: INSERTION OF MESH;  Surgeon: Jules Husbands, MD;  Location: [facility] ORS;  Service: General;;   KIDNEY STONE SURGERY Right    lung mass removal N/A    ORIF ANKLE FRACTURE Left 03/23/2017   Procedure: OPEN REDUCTION INTERNAL FIXATION (ORIF) ANKLE FRACTURE;  Surgeon: Leim Fabry, MD;  Location: [facility] ORS;  Service: Orthopedics;  Laterality: Left;   RECTAL EXAM UNDER ANESTHESIA  02/04/2015   Procedure: RECTAL EXAM UNDER ANESTHESIA;  Surgeon: Marlyce Huge, MD;  Location: [facility] ORS;  Service: General;;   SYNDESMOSIS REPAIR Left 03/23/2017   Procedure: SYNDESMOSIS REPAIR;  Surgeon: Leim Fabry, MD;  Location: [facility] ORS;  Service: Orthopedics;  Laterality: Left;   UMBILICAL HERNIA REPAIR  01/20/2022   Procedure: HERNIA REPAIR UMBILICAL ADULT;  Surgeon: Jules Husbands, MD;  Location: [facility] ORS;  Service: General;;   XI ROBOTIC ASSISTED PARAESOPHAGEAL HERNIA REPAIR N/A 01/20/2022   Procedure: XI ROBOTIC ASSISTED PARAESOPHAGEAL HERNIA REPAIR, CONVERTED TO OPEN, RNFA to assist;  Surgeon: Jules Husbands, MD;  Location: [facility] ORS;  Service: General;  Laterality: N/A;    Medications:  Current Outpatient Medications on File Prior to Visit  Medication Sig   acetaminophen (TYLENOL) 500 MG tablet Take 2 tablets (1,000 mg total) by mouth every 8 (eight) hours.   amitriptyline (ELAVIL) 10 MG tablet Take 30 mg by mouth at bedtime.   ascorbic acid (VITAMIN C) 500 MG tablet Take by mouth daily.   ezetimibe (ZETIA) 10 MG tablet Take 10 mg by mouth daily.   ferrous sulfate 325 (65 FE) MG tablet Take 325 mg by mouth daily with breakfast.   fluticasone (FLONASE) 50 MCG/ACT nasal spray Place into both nostrils.   gabapentin (NEURONTIN) 600 MG tablet Take 600 mg by mouth 3 (three) times daily.   hydrochlorothiazide  (HYDRODIURIL) 25 MG tablet Take 0.5 tablets (12.5 mg total) by mouth daily.   HYDROcodone-acetaminophen (NORCO/VICODIN) 5-325 MG tablet Take 1 tablet by mouth 2 (two) times daily as needed. Must last 30 days.   [START ON 02/26/2022] HYDROcodone-acetaminophen (NORCO/VICODIN) 5-325 MG tablet Take 1 tablet by mouth 2 (two) times daily as needed. Must last 30 days.   [START ON 03/28/2022] HYDROcodone-acetaminophen (NORCO/VICODIN) 5-325 MG tablet Take 1 tablet by mouth 2 (two) times daily as needed. Must last 30 days.   insulin isophane & regular human (HUMULIN 70/30 KWIKPEN) (70-30) 100 UNIT/ML KwikPen Inject 58 Units into the skin 2 (two) times daily with a meal. 58 with meals and 54 qhs   metFORMIN (GLUCOPHAGE-XR) 500 MG 24 hr tablet TAKE 4 TABLETS(2000 MG) BY MOUTH DAILY   montelukast (SINGULAIR) 10 MG tablet Take 10 mg by mouth daily.  naloxone (NARCAN) nasal spray 4 mg/0.1 mL Place 1 spray into the nose as needed for up to 365 doses (for opioid-induced respiratory depresssion). In case of emergency (overdose), spray once into each nostril. If no response within 3 minutes, repeat application and call 115.   ondansetron (ZOFRAN) 4 MG tablet Take 1 tablet (4 mg total) by mouth every 8 (eight) hours as needed for nausea or vomiting.   pantoprazole (PROTONIX) 40 MG tablet Take 2 tablets (80 mg total) by mouth daily.   PROAIR HFA 108 (90 Base) MCG/ACT inhaler Inhale 2 puffs into the lungs every 6 (six) hours as needed.   rosuvastatin (CRESTOR) 40 MG tablet Take 40 mg by mouth daily.   tadalafil (CIALIS) 5 MG tablet Take 1 tablet (5 mg total) by mouth daily as needed for erectile dysfunction.   testosterone cypionate (DEPOTESTOSTERONE CYPIONATE) 200 MG/ML injection Inject 0.5 mLs (100 mg total) into the muscle every 14 (fourteen) days.   TRELEGY ELLIPTA 200-62.5-25 MCG/INH AEPB Inhale 1 puff into the lungs daily.   triamcinolone cream (KENALOG) 0.1 % Apply 1 application  topically 2 (two) times daily.    TRULICITY 3 BW/6.2MB SOPN Inject 3 mg into the skin once a week.   No current facility-administered medications on file prior to visit.    Allergies:  Allergies  Allergen Reactions   Glipizide Other (See Comments) and Palpitations    Shaky, feel bad Other reaction(s): Dizziness   Cephalexin Rash   Duloxetine Anxiety and Nausea Only    Social History:  Social History   Socioeconomic History   Marital status: Single    Spouse name: Not on file   Number of children: Not on file   Years of education: Not on file   Highest education level: Not on file  Occupational History   Not on file  Tobacco Use   Smoking status: Every Day    Packs/day: 1.00    Years: 34.00    Total pack years: 34.00    Types: Cigarettes   Smokeless tobacco: Never   Tobacco comments:    patient using Nicoderm patches  Vaping Use   Vaping Use: Never used  Substance and Sexual Activity   Alcohol use: Not Currently    Alcohol/week: 12.0 standard drinks of alcohol    Types: 12 Cans of beer per week    Comment: Quit February 2023   Drug use: No   Sexual activity: Yes  Other Topics Concern   Not on file  Social History Narrative   Not on file   Social Determinants of Health   Financial Resource Strain: Not on file  Food Insecurity: No Food Insecurity (01/20/2022)   Hunger Vital Sign    Worried About Running Out of Food in the Last Year: Never true    Ran Out of Food in the Last Year: Never true  Transportation Needs: No Transportation Needs (01/20/2022)   PRAPARE - Hydrologist (Medical): No    Lack of Transportation (Non-Medical): No  Physical Activity: Not on file  Stress: Not on file  Social Connections: Not on file  Intimate Partner Violence: Not At Risk (01/20/2022)   Humiliation, Afraid, Rape, and Kick questionnaire    Fear of Current or Ex-Partner: No    Emotionally Abused: No    Physically Abused: No    Sexually Abused: No   Social History   Tobacco Use   Smoking Status Every Day   Packs/day: 1.00   Years: 34.00  Total pack years: 34.00   Types: Cigarettes  Smokeless Tobacco Never  Tobacco Comments   patient using Nicoderm patches   Social History   Substance and Sexual Activity  Alcohol Use Not Currently   Alcohol/week: 12.0 standard drinks of alcohol   Types: 12 Cans of beer per week   Comment: Quit February 2023    Family History:  Family History  Problem Relation Age of Onset   Cancer Mother 87       Lung   Cancer Father        Colon   Heart disease Father    Alcohol abuse Father    Cancer Brother 54       Esophageal   Diabetes Brother    Heart disease Brother     Past medical history, surgical history, medications, allergies, family history and social history reviewed with patient today and changes made to appropriate areas of the chart.   Review of Systems  Eyes:  Negative for blurred vision and double vision.  Respiratory:  Negative for shortness of breath.   Cardiovascular:  Negative for chest pain, palpitations and leg swelling.  Neurological:  Negative for dizziness and headaches.   All other ROS negative except what is listed above and in the HPI.      Objective:    BP 127/83   Pulse (!) 110   Temp 98.3 F (36.8 C) (Oral)   Wt 229 lb 8 oz (104.1 kg)   SpO2 97%   BMI 29.47 kg/m   Wt Readings from Last 3 Encounters:  02/20/22 229 lb 8 oz (104.1 kg)  02/15/22 225 lb (102.1 kg)  02/04/22 225 lb (102.1 kg)    No results found.  Physical Exam Vitals and nursing note reviewed.  Constitutional:      General: He is not in acute distress.    Appearance: Normal appearance. He is not ill-appearing, toxic-appearing or diaphoretic.  HENT:     Head: Normocephalic.     Right Ear: External ear normal.     Left Ear: External ear normal.     Nose: Nose normal. No congestion or rhinorrhea.     Mouth/Throat:     Mouth: Mucous membranes are moist.  Eyes:     General:        Right eye: No discharge.         Left eye: No discharge.     Extraocular Movements: Extraocular movements intact.     Conjunctiva/sclera: Conjunctivae normal.     Pupils: Pupils are equal, round, and reactive to light.  Cardiovascular:     Rate and Rhythm: Normal rate and regular rhythm.     Heart sounds: No murmur heard. Pulmonary:     Effort: Pulmonary effort is normal. No respiratory distress.     Breath sounds: Wheezing present. No rhonchi or rales.  Abdominal:     General: Abdomen is flat. Bowel sounds are normal.  Musculoskeletal:     Cervical back: Normal range of motion and neck supple.  Skin:    General: Skin is warm and dry.     Capillary Refill: Capillary refill takes less than 2 seconds.  Neurological:     General: No focal deficit present.     Mental Status: He is alert and oriented to person, place, and time.  Psychiatric:        Mood and Affect: Mood normal.        Behavior: Behavior normal.        Thought Content:  Thought content normal.        Judgment: Judgment normal.         No data to display          Cognitive Testing - 6-CIT  Correct? Score   What year is it? yes 0 Yes = 0    No = 4  What month is it? yes 0 Yes = 0    No = 3  Remember:     [place], [place], [place]     What time is it? yes 0 Yes = 0    No = 3  Count backwards from 20 to 1 yes 0 Correct = 0    1 error = 2   More than 1 error = 4  Say the months of the year in reverse. yes 0 Correct = 0    1 error = 2   More than 1 error = 4  What address did I ask you to remember? yes 2 Correct = 0  1 error = 2    2 error = 4    3 error = 6    4 error = 8    All wrong = 10       TOTAL SCORE  2/28   Interpretation:  Normal  Normal (0-7) Abnormal (8-28)    Results for orders placed or performed in visit on 02/17/22  HM DIABETES EYE EXAM  Result Value Ref Range   HM Diabetic Eye Exam No Retinopathy No Retinopathy      Assessment & Plan:   Problem List Items Addressed This Visit       Cardiovascular and  Mediastinum   Hypertension associated with diabetes ([place])    Chronic.  Symptoms have improved.   Taking HCTZ 1/2 tab.  HR is elevated at visit.  May need to add Metoprolol back at next visit.  Labs ordered today. Will make recommendations based on lab results.  Follow up in 3 months.  Call sooner if concerns arise.        Relevant Orders   Comp Met (CMET)   PVD (peripheral vascular disease) (HCC)    Chronic.  Controlled.  Continue with current medication regimen.  Labs ordered today.  Return to clinic in 3 months for reevaluation.  Call sooner if concerns arise.          Respiratory   Chronic obstructive pulmonary disease (HCC) (Chronic)    Chronic.  Controlled.  Using maintenance inhalers daily.  Using albuterol about 3 x daily.  Still wheezing on exam.  Continue with inhalers at home.  Follow up in 3 months.  Call sooner if concerns arise.         Endocrine   Diabetic peripheral neuropathy (HCC) (Chronic)    Chronic.  Controlled.  Continue with current medication regimen.  Followed by pain management.  Doing well at this time.  Labs ordered today.  Return to clinic in 3 months for reevaluation.  Call sooner if concerns arise.        Hyperlipidemia associated with type 2 diabetes mellitus (HCC)    Chronic.  Controlled.  Continue with current medication regimen.  Labs ordered today.  Return to clinic in 6 months for reevaluation.  Call sooner if concerns arise.        Type 2 diabetes mellitus with diabetic neuropathy, without long-term current use of insulin (HCC)    Chronic. Not well controlled.  See's Endocrinology.  Currently on Trulicity, Metformin and  long acting insulin.  Recently got continuous glucose monitor.  Sugars are running 80- <200.  Continue to follow their recommendations.  A1c ordered today.  Follow up in 3 months. Call sooner if concerns arise.       Relevant Orders   HgB A1c   Microalbumin, Urine Waived     Other   Iron deficiency anemia    Likely due to  recent surgery.  Has started Iron supplement.  Will recheck CBC at visit today.  Will make recommendations based on lab results.       Relevant Orders   CBC w/Diff   Advanced care planning/counseling discussion    A voluntary discussion about advance care planning including the explanation and discussion of advance directives was extensively discussed  with the patient for 10 minutes with patient.  Explanation about the health care proxy and Living will was reviewed and packet with forms with explanation of how to fill them out was given.  During this discussion, the patient was able to identify a health care proxy as his friend and plans to fill out the paperwork required.  Patient was offered a separate [place] visit for further assistance with forms.         Other Visit Diagnoses     Encounter for annual wellness exam in Medicare patient    -  Primary   Relevant Orders   [place] MEDICARE SCREENING   Need for influenza vaccination       Relevant Orders   Flu Vaccine QUAD 6+ mos PF IM (Fluarix Quad PF) (Completed)        Preventative Services:  Health Risk Assessment and Personalized Prevention Plan: Up to date Bone Mass Measurements: NA CVD Screening: Up to date Colon Cancer Screening: Up to date Depression Screening: Up to date Diabetes Screening: Up to date Glaucoma Screening: Up to date Hepatitis B vaccine: NA Hepatitis C screening: Up to date HIV Screening: Up to date Flu Vaccine: Given today Lung cancer Screening: Up to date Obesity Screening: Up to date Pneumonia Vaccines (2): Up to date STI Screening: NA PSA screening: Up to date  Discussed aspirin prophylaxis for myocardial infarction prevention and decision was made to continue ASA  LABORATORY TESTING:  Health maintenance labs ordered today as discussed above.   The natural history of prostate cancer and ongoing controversy regarding screening and potential treatment outcomes of prostate cancer  has been discussed with the patient. The meaning of a false positive PSA and a false negative PSA has been discussed. He indicates understanding of the limitations of this screening test and wishes to proceed with screening PSA testing.   IMMUNIZATIONS:   - Tdap: Tetanus vaccination status reviewed: last tetanus booster within 10 years. - Influenza: Up to date - Pneumovax: Up to date - Prevnar: Not applicable - Zostavax vaccine:  Has received the first one  SCREENING: - Colonoscopy: Up to date  Discussed with patient purpose of the colonoscopy is to detect colon cancer at curable precancerous or early stages   - AAA Screening: Ordered today  -Hearing Test: Not applicable  -Spirometry: Not applicable   PATIENT COUNSELING:    Sexuality: Discussed sexually transmitted diseases, partner selection, use of condoms, avoidance of unintended pregnancy  and contraceptive alternatives.   Advised to avoid cigarette smoking.  I discussed with the patient that most people either abstain from alcohol or drink within safe limits (<=14/week and <=4 drinks/occasion for males, <=7/weeks and <= 3 drinks/occasion for females) and that  the risk for alcohol disorders and other health effects rises proportionally with the number of drinks per week and how often a drinker exceeds daily limits.  Discussed cessation/primary prevention of drug use and availability of treatment for abuse.   Diet: Encouraged to adjust caloric intake to maintain  or achieve ideal body weight, to reduce intake of dietary saturated fat and total fat, to limit sodium intake by avoiding high sodium foods and not adding table salt, and to maintain adequate dietary potassium and calcium preferably from fresh fruits, vegetables, and low-fat dairy products.    stressed the importance of regular exercise  Injury prevention: Discussed safety belts, safety helmets, smoke detector, smoking near bedding or upholstery.   Dental health:  Discussed importance of regular tooth brushing, flossing, and dental visits.   Follow up plan: NEXT PREVENTATIVE PHYSICAL DUE IN 1 YEAR. Return in about 3 months (around 05/22/2022) for HTN, HLD, DM2 FU.

## 2022-02-20 ENCOUNTER — Ambulatory Visit (INDEPENDENT_AMBULATORY_CARE_PROVIDER_SITE_OTHER): Payer: PPO | Admitting: Nurse Practitioner

## 2022-02-20 ENCOUNTER — Other Ambulatory Visit: Payer: HMO

## 2022-02-20 ENCOUNTER — Encounter: Payer: Self-pay | Admitting: Nurse Practitioner

## 2022-02-20 VITALS — BP 127/83 | HR 110 | Temp 98.3°F | Wt 229.5 lb

## 2022-02-20 DIAGNOSIS — E1159 Type 2 diabetes mellitus with other circulatory complications: Secondary | ICD-10-CM

## 2022-02-20 DIAGNOSIS — J449 Chronic obstructive pulmonary disease, unspecified: Secondary | ICD-10-CM | POA: Diagnosis not present

## 2022-02-20 DIAGNOSIS — E1142 Type 2 diabetes mellitus with diabetic polyneuropathy: Secondary | ICD-10-CM | POA: Diagnosis not present

## 2022-02-20 DIAGNOSIS — Z0000 Encounter for general adult medical examination without abnormal findings: Secondary | ICD-10-CM

## 2022-02-20 DIAGNOSIS — D509 Iron deficiency anemia, unspecified: Secondary | ICD-10-CM

## 2022-02-20 DIAGNOSIS — E1169 Type 2 diabetes mellitus with other specified complication: Secondary | ICD-10-CM

## 2022-02-20 DIAGNOSIS — I152 Hypertension secondary to endocrine disorders: Secondary | ICD-10-CM

## 2022-02-20 DIAGNOSIS — I739 Peripheral vascular disease, unspecified: Secondary | ICD-10-CM

## 2022-02-20 DIAGNOSIS — E1140 Type 2 diabetes mellitus with diabetic neuropathy, unspecified: Secondary | ICD-10-CM

## 2022-02-20 DIAGNOSIS — Z7189 Other specified counseling: Secondary | ICD-10-CM

## 2022-02-20 DIAGNOSIS — Z23 Encounter for immunization: Secondary | ICD-10-CM | POA: Diagnosis not present

## 2022-02-20 DIAGNOSIS — E785 Hyperlipidemia, unspecified: Secondary | ICD-10-CM

## 2022-02-20 NOTE — Assessment & Plan Note (Signed)
Likely due to recent surgery.  Has started Iron supplement.  Will recheck CBC at visit today.  Will make recommendations based on lab results.

## 2022-02-20 NOTE — Patient Instructions (Signed)
Preventative Services:  Health Risk Assessment and Personalized Prevention Plan: Up to date Bone Mass Measurements: NA CVD Screening: Up to date Colon Cancer Screening: Up to date Depression Screening: Up to date Diabetes Screening: Up to date Glaucoma Screening: Up to date Hepatitis B vaccine: NA Hepatitis C screening: Up to date HIV Screening: Up to date Flu Vaccine: Given today Lung cancer Screening: Up to date Obesity Screening: Up to date Pneumonia Vaccines (2): Up to date STI Screening: NA PSA screening: Up to date

## 2022-02-20 NOTE — Assessment & Plan Note (Signed)
Chronic. Not well controlled.  See's Endocrinology.  Currently on Trulicity, Metformin and long acting insulin.  Recently got continuous glucose monitor.  Sugars are running 80- <200.  Continue to follow their recommendations.  A1c ordered today.  Follow up in 3 months. Call sooner if concerns arise.

## 2022-02-20 NOTE — Assessment & Plan Note (Signed)
A voluntary discussion about advance care planning including the explanation and discussion of advance directives was extensively discussed  with the patient for 10 minutes with patient.  Explanation about the health care proxy and Living will was reviewed and packet with forms with explanation of how to fill them out was given.  During this discussion, the patient was able to identify a health care proxy as his friend and plans to fill out the paperwork required.  Patient was offered a separate [place] visit for further assistance with forms.

## 2022-02-20 NOTE — Assessment & Plan Note (Signed)
Chronic.  Controlled.  Using maintenance inhalers daily.  Using albuterol about 3 x daily.  Still wheezing on exam.  Continue with inhalers at home.  Follow up in 3 months.  Call sooner if concerns arise.

## 2022-02-20 NOTE — Assessment & Plan Note (Signed)
Chronic.  Controlled.  Continue with current medication regimen.  Labs ordered today.  Return to clinic in 3 months for reevaluation.  Call sooner if concerns arise.   

## 2022-02-20 NOTE — Assessment & Plan Note (Signed)
Chronic.  Symptoms have improved.   Taking HCTZ 1/2 tab.  HR is elevated at visit.  May need to add Metoprolol back at next visit.  Labs ordered today. Will make recommendations based on lab results.  Follow up in 3 months.  Call sooner if concerns arise.

## 2022-02-20 NOTE — Assessment & Plan Note (Signed)
Chronic.  Controlled.  Continue with current medication regimen.  Labs ordered today.  Return to clinic in 6 months for reevaluation.  Call sooner if concerns arise.  ? ?

## 2022-02-20 NOTE — Assessment & Plan Note (Signed)
Chronic.  Controlled.  Continue with current medication regimen.  Followed by pain management.  Doing well at this time.  Labs ordered today.  Return to clinic in 3 months for reevaluation.  Call sooner if concerns arise.

## 2022-02-21 LAB — CBC WITH DIFFERENTIAL/PLATELET
Basophils Absolute: 0.1 10*3/uL (ref 0.0–0.2)
Basos: 1 %
EOS (ABSOLUTE): 0.4 10*3/uL (ref 0.0–0.4)
Eos: 5 %
Hematocrit: 35 % — ABNORMAL LOW (ref 37.5–51.0)
Hemoglobin: 11 g/dL — ABNORMAL LOW (ref 13.0–17.7)
Immature Grans (Abs): 0 10*3/uL (ref 0.0–0.1)
Immature Granulocytes: 0 %
Lymphocytes Absolute: 1.3 10*3/uL (ref 0.7–3.1)
Lymphs: 20 %
MCH: 26.8 pg (ref 26.6–33.0)
MCHC: 31.4 g/dL — ABNORMAL LOW (ref 31.5–35.7)
MCV: 85 fL (ref 79–97)
Monocytes Absolute: 0.7 10*3/uL (ref 0.1–0.9)
Monocytes: 11 %
Neutrophils Absolute: 4.2 10*3/uL (ref 1.4–7.0)
Neutrophils: 63 %
Platelets: 243 10*3/uL (ref 150–450)
RBC: 4.1 x10E6/uL — ABNORMAL LOW (ref 4.14–5.80)
RDW: 13.9 % (ref 11.6–15.4)
WBC: 6.7 10*3/uL (ref 3.4–10.8)

## 2022-02-21 LAB — COMPREHENSIVE METABOLIC PANEL
ALT: 19 IU/L (ref 0–44)
AST: 20 IU/L (ref 0–40)
Albumin/Globulin Ratio: 2 (ref 1.2–2.2)
Albumin: 4.4 g/dL (ref 3.8–4.9)
Alkaline Phosphatase: 86 IU/L (ref 44–121)
BUN/Creatinine Ratio: 12 (ref 9–20)
BUN: 14 mg/dL (ref 6–24)
Bilirubin Total: 0.4 mg/dL (ref 0.0–1.2)
CO2: 23 mmol/L (ref 20–29)
Calcium: 9.3 mg/dL (ref 8.7–10.2)
Chloride: 100 mmol/L (ref 96–106)
Creatinine, Ser: 1.13 mg/dL (ref 0.76–1.27)
Globulin, Total: 2.2 g/dL (ref 1.5–4.5)
Glucose: 202 mg/dL — ABNORMAL HIGH (ref 70–99)
Potassium: 3.7 mmol/L (ref 3.5–5.2)
Sodium: 139 mmol/L (ref 134–144)
Total Protein: 6.6 g/dL (ref 6.0–8.5)
eGFR: 75 mL/min/{1.73_m2} (ref >59)

## 2022-02-21 LAB — HEMOGLOBIN A1C
Est. average glucose Bld gHb Est-mCnc: 146 mg/dL
Hgb A1c MFr Bld: 6.7 % — ABNORMAL HIGH (ref 4.8–5.6)

## 2022-02-23 NOTE — Progress Notes (Signed)
Hi Darren Allen. It was nice to see you last week.  Your lab work looks good.  Your A1c is well controlled at 6.7.  Keep up the great work.  Anemia is continuing to improve.  No concerns at this time. Continue with your current medication regimen.  Follow up as discussed.  Please let me know if you have any questions.

## 2022-02-24 ENCOUNTER — Ambulatory Visit: Payer: HMO | Admitting: Nurse Practitioner

## 2022-02-26 ENCOUNTER — Ambulatory Visit
Admission: RE | Admit: 2022-02-26 | Discharge: 2022-02-26 | Disposition: A | Discharge status: Home / Self Care | Payer: PPO | Source: Ambulatory Visit | Attending: Nurse Practitioner | Admitting: Nurse Practitioner

## 2022-02-26 ENCOUNTER — Other Ambulatory Visit: Payer: PPO

## 2022-02-26 DIAGNOSIS — Z0000 Encounter for general adult medical examination without abnormal findings: Secondary | ICD-10-CM | POA: Diagnosis present

## 2022-02-26 DIAGNOSIS — E349 Endocrine disorder, unspecified: Secondary | ICD-10-CM

## 2022-02-27 ENCOUNTER — Encounter: Payer: Self-pay | Admitting: Family Medicine

## 2022-02-27 LAB — TESTOSTERONE: Testosterone: 253 ng/dL — ABNORMAL LOW (ref 264–916)

## 2022-03-02 ENCOUNTER — Other Ambulatory Visit: Payer: Self-pay

## 2022-03-02 ENCOUNTER — Ambulatory Visit (INDEPENDENT_AMBULATORY_CARE_PROVIDER_SITE_OTHER): Payer: HMO | Admitting: Surgery

## 2022-03-02 ENCOUNTER — Encounter: Payer: Self-pay | Admitting: Surgery

## 2022-03-02 VITALS — BP 150/81 | HR 102 | Temp 98.6°F | Ht 74.0 in | Wt 231.0 lb

## 2022-03-02 DIAGNOSIS — K449 Diaphragmatic hernia without obstruction or gangrene: Secondary | ICD-10-CM

## 2022-03-02 DIAGNOSIS — Z09 Encounter for follow-up examination after completed treatment for conditions other than malignant neoplasm: Secondary | ICD-10-CM

## 2022-03-02 NOTE — Patient Instructions (Signed)
We will contact you to schedule an appointment for march 2024.

## 2022-03-02 NOTE — Progress Notes (Signed)
Please let patient know that there was no evidence of an abdominal aortic aneurysm.

## 2022-03-03 NOTE — Progress Notes (Signed)
Darren Allen is 5 weeks out following open repair of paraesophageal hernia.  He does have chronic pain and COPD.  He went to the emergency room for some abdominal discomfort and CT scan personally reviewed showing no evidence of complications related to his recent surgery.  He does have a collection but this is noninfected, and this is seen related to biologic mesh placement. He has been tolerating diet.  Unfortunately did not follow Nissen diet advice and has been eating regular diet including carbonated beverages and bread. Discussed with him the importance of adherence to diet.  PE: NAD Abd: soft, incision c/d/I no infection or peritonitis  A/p Doing well w/o complications RTC 90month

## 2022-03-11 ENCOUNTER — Other Ambulatory Visit: Payer: PPO

## 2022-03-22 NOTE — Progress Notes (Unsigned)
PROVIDER NOTE: Interpretation of information contained herein should be left to medically-trained personnel. Specific patient instructions are provided elsewhere under "Patient Instructions" section of medical record. This document was created in part using STT-dictation technology, any transcriptional errors that may result from this process are unintentional.  Patient: Darren Allen Type: Established DOB: 04/16/1965 MRN: [id number] PCP: Jon Billings, NP  Service: Procedure DOS: 03/24/2022 Setting: Ambulatory Location: Ambulatory outpatient facility Delivery: Face-to-face Provider: Gaspar Cola, MD Specialty: Interventional Pain Management Specialty designation: 09 Location: Outpatient facility Ref. Prov.: Jon Billings, NP    Interventional Therapy      Procedure: Caudal Epidural Steroid Injection    ***     Laterality: Midline         Level: Sacrococcygeal ligament  Imaging: Fluoroscopy-guided         Anesthesia: Local anesthesia (1-2% Lidocaine) Anxiolysis: None                 Sedation:                         DOS: 03/24/2022  Performed by: Gaspar Cola, MD  Purpose: Diagnostic/Therapeutic Indications: Low back and lower extremity pain severe enough to impact quality of life or function. Rationale (medical necessity): procedure needed and proper for the diagnosis and/or treatment of [name] medical symptoms and needs. No diagnosis found. NAS-11 Pain score:   Pre-procedure:  /10   Post-procedure:  /10     Target: Lumbosacral epidural canal  Location: Epidural space  Region: Caudal canal Approach: Percutaneous  Type of procedure: Epidural block  Position  Prep  Materials:  Position: Prone  Prep solution: DuraPrep (Iodine Povacrylex [0.7% available iodine] and Isopropyl Alcohol, 74% w/w) Prep Area: Entire posterior lumbosacral area  Materials:  Tray: Epidural Needle(s):  Type: Epidural  Gauge (G): 17  Length: 3.5-in  Qty: 1  Pre-op  H&P Assessment:  [name] is a 57 y.o. (year old), male patient, seen today for interventional treatment. He  has a past surgical history that includes Appendectomy; Incision and drainage perirectal abscess (N/A, 02/04/2015); Rectal exam under anesthesia (02/04/2015); ORIF ankle fracture (Left, 03/23/2017); Syndesmosis repair (Left, 03/23/2017); Colonoscopy with propofol (N/A, 12/31/2021); Esophagogastroduodenoscopy (N/A, 12/31/2021); Kidney stone surgery (Right); lung mass removal (N/A); Xi robotic assisted paraesophageal hernia repair (N/A, 01/20/2022); Insertion of mesh (12/16/5850); and Umbilical hernia repair (01/20/2022). [name] has a current medication list which includes the following prescription(s): acetaminophen, amitriptyline, ascorbic acid, ezetimibe, ferrous sulfate, fluticasone, gabapentin, hydrochlorothiazide, hydrocodone-acetaminophen, hydrocodone-acetaminophen, [START ON 03/28/2022] hydrocodone-acetaminophen, humulin 70/30 kwikpen, metformin, montelukast, naloxone, ondansetron, pantoprazole, proair hfa, rosuvastatin, tadalafil, testosterone cypionate, trelegy ellipta, triamcinolone cream, and trulicity. His primarily concern today is the No chief complaint on file.  Initial Vital Signs:  Pulse/HCG Rate:    Temp:   Resp:   BP:   SpO2:    BMI: Estimated body mass index is 29.66 kg/m as calculated from the following:   Height as of 03/02/22: '6\' 2"'$  (1.88 m).   Weight as of 03/02/22: 231 lb (104.8 kg).  Risk Assessment: Allergies: Reviewed. He is allergic to glipizide, cephalexin, and duloxetine.  Allergy Precautions: None required Coagulopathies: Reviewed. None identified.  Blood-thinner therapy: None at this time Active Infection(s): Reviewed. None identified. [name] is afebrile  Site Confirmation: [name] was asked to confirm the procedure and laterality before marking the site Procedure checklist: Completed Consent: Before the procedure and under the influence of no  sedative(s), amnesic(s), or anxiolytics, the patient was  informed of the treatment options, risks and possible complications. To fulfill our ethical and legal obligations, as recommended by the American Medical Association's Code of Ethics, I have informed the patient of my clinical impression; the nature and purpose of the treatment or procedure; the risks, benefits, and possible complications of the intervention; the alternatives, including doing nothing; the risk(s) and benefit(s) of the alternative treatment(s) or procedure(s); and the risk(s) and benefit(s) of doing nothing. The patient was provided information about the general risks and possible complications associated with the procedure. These may include, but are not limited to: failure to achieve desired goals, infection, bleeding, organ or nerve damage, allergic reactions, paralysis, and death. In addition, the patient was informed of those risks and complications associated to Spine-related procedures, such as failure to decrease pain; infection (i.e.: Meningitis, epidural or intraspinal abscess); bleeding (i.e.: epidural hematoma, subarachnoid hemorrhage, or any other type of intraspinal or peri-dural bleeding); organ or nerve damage (i.e.: Any type of peripheral nerve, nerve root, or spinal cord injury) with subsequent damage to sensory, motor, and/or autonomic systems, resulting in permanent pain, numbness, and/or weakness of one or several areas of the body; allergic reactions; (i.e.: anaphylactic reaction); and/or death. Furthermore, the patient was informed of those risks and complications associated with the medications. These include, but are not limited to: allergic reactions (i.e.: anaphylactic or anaphylactoid reaction(s)); adrenal axis suppression; blood sugar elevation that in diabetics may result in ketoacidosis or comma; water retention that in patients with history of congestive heart failure may result in shortness of breath,  pulmonary edema, and decompensation with resultant heart failure; weight gain; swelling or edema; medication-induced neural toxicity; particulate matter embolism and blood vessel occlusion with resultant organ, and/or nervous system infarction; and/or aseptic necrosis of one or more joints. Finally, the patient was informed that Medicine is not an exact science; therefore, there is also the possibility of unforeseen or unpredictable risks and/or possible complications that may result in a catastrophic outcome. The patient indicated having understood very clearly. We have given the patient no guarantees and we have made no promises. Enough time was given to the patient to ask questions, all of which were answered to the patient's satisfaction. Mr. Crescenzo has indicated that he wanted to continue with the procedure. Attestation: I, the ordering provider, attest that I have discussed with the patient the benefits, risks, side-effects, alternatives, likelihood of achieving goals, and potential problems during recovery for the procedure that I have provided informed consent. Date  Time: {CHL [facility]-PAIN TIME CHOICES:21018001}  Imaging Guidance (Spinal):          Type of Imaging Technique: Fluoroscopy Guidance (Spinal) Indication(s): Assistance in needle guidance and placement for procedures requiring needle placement in or near specific anatomical locations not easily accessible without such assistance. Exposure Time: Please see nurses notes. Contrast: Before injecting any contrast, we confirmed that the patient did not have an allergy to iodine, shellfish, or radiological contrast. Once satisfactory needle placement was completed at the desired level, radiological contrast was injected. Contrast injected under live fluoroscopy. No contrast complications. See chart for type and volume of contrast used. Fluoroscopic Guidance: I was personally present during the use of fluoroscopy. "Tunnel Vision Technique" used to  obtain the best possible view of the target area. Parallax error corrected before commencing the procedure. "Direction-depth-direction" technique used to introduce the needle under continuous pulsed fluoroscopy. Once target was reached, antero-posterior, oblique, and lateral fluoroscopic projection used confirm needle placement in all planes. Images permanently stored in EMR.  Interpretation: I personally interpreted the imaging intraoperatively. Adequate needle placement confirmed in multiple planes. Appropriate spread of contrast into desired area was observed. No evidence of afferent or efferent intravascular uptake. No intrathecal or subarachnoid spread observed. Permanent images saved into the patient's record.  Pre-Procedure Preparation:  Monitoring: As per clinic protocol. Respiration, ETCO2, SpO2, BP, heart rate and rhythm monitor placed and checked for adequate function Safety Precautions: Patient was assessed for positional comfort and pressure points before starting the procedure. Time-out: I initiated and conducted the "Time-out" before starting the procedure, as per protocol. The patient was asked to participate by confirming the accuracy of the "Time Out" information. Verification of the correct person, site, and procedure were performed and confirmed by me, the nursing staff, and the patient. "Time-out" conducted as per Joint Commission's Universal Protocol (UP.01.01.01). Time:    Description  Narrative of Procedure:          Procedural Technique Safety Precautions: Aspiration looking for blood return was conducted prior to all injections. At no point did we inject any substances, as a needle was being advanced. No attempts were made at seeking any paresthesias. Safe injection practices and needle disposal techniques used. Medications properly checked for expiration dates. SDV (single dose vial) medications used. Description of the Procedure: Protocol guidelines were followed. The patient  was assisted into a comfortable position. The target area was identified and the area prepped in the usual manner. Skin & deeper tissues infiltrated with local anesthetic. Appropriate amount of time allowed to pass for local anesthetics to take effect. The procedure needles were then advanced to the target area. Proper needle placement secured. Negative aspiration confirmed. Solution injected in intermittent fashion, asking for systemic symptoms every 0.5cc of injectate. The needles were then removed and the area cleansed, making sure to leave some of the prepping solution back to take advantage of its long term bactericidal properties.  Technical description of procedure:  Safety Precautions: Aspiration looking for blood return was conducted prior to all injections. At no point did we inject any substances, as a needle was being advanced. No attempts were made at seeking any paresthesias. Safe injection practices and needle disposal techniques used. Medications properly checked for expiration dates. SDV (single dose vial) medications used. Description of the Procedure: Protocol guidelines were followed. The patient was placed in position over the fluoroscopy table. The target area was identified and the area prepped in the usual manner. Skin & deeper tissues infiltrated with local anesthetic. Appropriate amount of time allowed to pass for local anesthetics to take effect. The procedure needle was then advanced to the target area. Proper needle placement secured. Negative aspiration confirmed. Solution injected in intermittent fashion, asking for systemic symptoms every 0.5cc of injectate. The needles were then removed and the area cleansed, making sure to leave some of the prepping solution back to take advantage of its long term bactericidal properties.  There were no vitals filed for this visit.   Start Time:   hrs. End Time:   hrs.  Post-operative Assessment:  Post-procedure Vital Signs:  Pulse/HCG  Rate:    Temp:   Resp:   BP:   SpO2:    EBL: None  Complications: No immediate post-treatment complications observed by team, or reported by patient.  Note: The patient tolerated the entire procedure well. A repeat set of vitals were taken after the procedure and the patient was kept under observation following institutional policy, for this type of procedure. Post-procedural neurological assessment was performed, showing return  to baseline, prior to discharge. The patient was provided with post-procedure discharge instructions, including a section on how to identify potential problems. Should any problems arise concerning this procedure, the patient was given instructions to immediately contact us, at any time, without hesitation. In any case, we plan to contact the patient by telephone for a follow-up status report regarding this interventional procedure.  Comments:  No additional relevant information.  Plan of Care  Orders:  No orders of the defined types were placed in this encounter.  Chronic Opioid Analgesic:  Hydrocodone/APAP 5/325 tablet, 1 tab p.o. twice daily (#60) MME/day: 10 mg/day   Medications ordered for procedure: No orders of the defined types were placed in this encounter.  Medications administered: Hershey A. Magnussen had no medications administered during this visit.  See the medical record for exact dosing, route, and time of administration.  Follow-up plan:   No follow-ups on file.       Interventional Therapies  Risk  Complexity Considerations:   Estimated body mass index is 33.91 kg/m as calculated from the following:   Height as of this encounter: 6' (1.829 m).   Weight as of this encounter: 250 lb (113.4 kg). WNL   Planned  Pending:      Under consideration:   Diagnostic bilateral lumbar facet MBB #1  Diagnostic/therapeutic left L5 & S1 TFESI #1  Possible spinal cord stimulator trial  Therapeutic left L5-S1 percutaneous discectomy with "[employer]  Dekompressor" system    Completed:   Diagnostic left L5-S1 LESI x1 (12/16/2021) (100/100/100/LBP:85  LEP:100)  Therapeutic bilateral Qutenza neurolytic treatment x1 (12/23/2021)  (09/08/2021 & 10/29/2021) referral to physical therapy for evaluation and treatment of low back pain.   Completed by other providers:   EMG/PNCV of lower extremity (02/20/2020) by Dr. Jennings Books (generalized sensorimotor peripheral neuropathy; superimposed left S1 radiculopathy)   Therapeutic  Palliative (PRN) options:   None established     Recent Visits Date Type Provider Dept  01/13/22 Office Visit Milinda Pointer, MD [facility]  12/23/21 Procedure visit Milinda Pointer, MD [facility]  Showing recent visits within past 90 days and meeting all other requirements Future Appointments Date Type Provider Dept  03/24/22 Appointment Milinda Pointer, [facility]  04/20/22 Appointment Milinda Pointer, MD [facility]  Showing future appointments within next 90 days and meeting all other requirements  Disposition: Discharge home  Discharge (Date  Time): 03/24/2022;   hrs.   Primary Care Physician: Jon Billings, NP Location: [facility] Note by: Gaspar Cola, MD Date: 03/24/2022; Time: 6:11 PM  Disclaimer:  Medicine is not an Chief Strategy Officer. The only guarantee in medicine is that nothing is guaranteed. It is important to note that the decision to proceed with this intervention was based on the information collected from the patient. The Data and conclusions were drawn from the patient's questionnaire, the interview, and the physical examination. Because the information was provided in large part by the patient, it cannot be guaranteed that it has not been purposely or unconsciously manipulated. Every effort has been made to obtain as much relevant data as possible for this evaluation. It is important to note that the  conclusions that lead to this procedure are derived in large part from the available data. Always take into account that the treatment will also be dependent on availability of resources and existing treatment guidelines, considered by other Pain Management Practitioners as being common knowledge and practice, at the time of the  intervention. For Medico-Legal purposes, it is also important to point out that variation in procedural techniques and pharmacological choices are the acceptable norm. The indications, contraindications, technique, and results of the above procedure should only be interpreted and judged by a Board-Certified Interventional Pain Specialist with extensive familiarity and expertise in the same exact procedure and technique.

## 2022-03-23 ENCOUNTER — Other Ambulatory Visit: Payer: 59

## 2022-03-23 ENCOUNTER — Other Ambulatory Visit: Payer: Self-pay | Admitting: *Deleted

## 2022-03-23 DIAGNOSIS — E291 Testicular hypofunction: Secondary | ICD-10-CM

## 2022-03-23 DIAGNOSIS — E349 Endocrine disorder, unspecified: Secondary | ICD-10-CM

## 2022-03-24 ENCOUNTER — Encounter: Payer: Self-pay | Admitting: Nurse Practitioner

## 2022-03-24 ENCOUNTER — Ambulatory Visit: Payer: 59 | Attending: Pain Medicine | Admitting: Pain Medicine

## 2022-03-24 ENCOUNTER — Telehealth: Payer: Self-pay | Admitting: Family Medicine

## 2022-03-24 DIAGNOSIS — Z5989 Other problems related to housing and economic circumstances: Secondary | ICD-10-CM

## 2022-03-24 LAB — TESTOSTERONE: Testosterone: 559 ng/dL (ref 264–916)

## 2022-03-24 NOTE — Telephone Encounter (Signed)
LMOM for patient to return call.

## 2022-03-24 NOTE — Telephone Encounter (Signed)
Spoke with patient and advised results Appts scheduled

## 2022-03-24 NOTE — Telephone Encounter (Signed)
-----   Message from Nori Riis, PA-C sent at 03/24/2022  8:12 AM EST ----- Please let Mr. Schupp know that his testosterone level is therapeutic and I need to see him in April for I PSS, SHIM and exam with testosterone level, hemoglobin, hematocrit and PSA prior.

## 2022-03-25 MED ORDER — ROSUVASTATIN CALCIUM 40 MG PO TABS: 40.0000 mg | ORAL_TABLET | Freq: Every day | ORAL | 1 refills | Status: DC

## 2022-03-30 ENCOUNTER — Telehealth: Payer: Self-pay | Admitting: Pain Medicine

## 2022-03-30 ENCOUNTER — Telehealth: Payer: Self-pay | Admitting: Nurse Practitioner

## 2022-03-30 NOTE — Telephone Encounter (Signed)
PA done

## 2022-03-30 NOTE — Telephone Encounter (Signed)
PT states that the pharmacy needs a PA for his prescription to be fill due to his new insurance. Please give patient a call once it's been send in. Thanks

## 2022-03-30 NOTE — Telephone Encounter (Signed)
Routing to referral coordinator.

## 2022-03-30 NOTE — Telephone Encounter (Signed)
Copied from [place] [id number]. Topic: General - Other >> Mar 30, 2022 12:48 PM Sabas Sous wrote: Reason for CRM: Yvone Neu from [facility] called requesting a prior authorization for an upcoming surgery... "Adult surgery...." (?)  Fax: [phone]   Prior authorization line: [phone]  Best contact: Yvone Neu [phone]

## 2022-04-02 ENCOUNTER — Ambulatory Visit: Payer: 59 | Attending: Pain Medicine | Admitting: Pain Medicine

## 2022-04-02 ENCOUNTER — Ambulatory Visit
Admission: RE | Admit: 2022-04-02 | Discharge: 2022-04-02 | Disposition: A | Discharge status: Home / Self Care | Payer: 59 | Source: Ambulatory Visit | Attending: Pain Medicine | Admitting: Pain Medicine

## 2022-04-02 ENCOUNTER — Encounter: Payer: Self-pay | Admitting: Pain Medicine

## 2022-04-02 VITALS — BP 159/97 | HR 70 | Temp 97.2°F | Resp 18 | Ht 74.0 in | Wt 231.0 lb

## 2022-04-02 DIAGNOSIS — E1142 Type 2 diabetes mellitus with diabetic polyneuropathy: Secondary | ICD-10-CM | POA: Diagnosis present

## 2022-04-02 DIAGNOSIS — R937 Abnormal findings on diagnostic imaging of other parts of musculoskeletal system: Secondary | ICD-10-CM | POA: Diagnosis present

## 2022-04-02 DIAGNOSIS — M5442 Lumbago with sciatica, left side: Secondary | ICD-10-CM | POA: Diagnosis present

## 2022-04-02 DIAGNOSIS — M4807 Spinal stenosis, lumbosacral region: Secondary | ICD-10-CM | POA: Insufficient documentation

## 2022-04-02 DIAGNOSIS — M5137 Other intervertebral disc degeneration, lumbosacral region: Secondary | ICD-10-CM | POA: Insufficient documentation

## 2022-04-02 DIAGNOSIS — M5417 Radiculopathy, lumbosacral region: Secondary | ICD-10-CM | POA: Insufficient documentation

## 2022-04-02 DIAGNOSIS — G8929 Other chronic pain: Secondary | ICD-10-CM | POA: Insufficient documentation

## 2022-04-02 MED ORDER — ROPIVACAINE HCL 2 MG/ML IJ SOLN
2.0000 mL | Freq: Once | INTRAMUSCULAR | Status: AC
  Administered 2022-04-02: 2 mL via EPIDURAL
  Filled 2022-04-02: qty 20

## 2022-04-02 MED ORDER — LIDOCAINE HCL 2 % IJ SOLN
20.0000 mL | Freq: Once | INTRAMUSCULAR | Status: AC
  Administered 2022-04-02: 100 mg
  Filled 2022-04-02: qty 20

## 2022-04-02 MED ORDER — PENTAFLUOROPROP-TETRAFLUOROETH EX AERO
INHALATION_SPRAY | Freq: Once | CUTANEOUS | Status: AC
  Administered 2022-04-02: 30 via TOPICAL

## 2022-04-02 MED ORDER — TRIAMCINOLONE ACETONIDE 40 MG/ML IJ SUSP
40.0000 mg | Freq: Once | INTRAMUSCULAR | Status: AC
  Administered 2022-04-02: 40 mg
  Filled 2022-04-02: qty 1

## 2022-04-02 MED ORDER — SODIUM CHLORIDE (PF) 0.9 % IJ SOLN
INTRAMUSCULAR | Status: AC
  Filled 2022-04-02: qty 10

## 2022-04-02 MED ORDER — SODIUM CHLORIDE 0.9% FLUSH
2.0000 mL | Freq: Once | INTRAVENOUS | Status: AC
  Administered 2022-04-02: 2 mL

## 2022-04-02 MED ORDER — IOHEXOL 180 MG/ML  SOLN
10.0000 mL | Freq: Once | INTRAMUSCULAR | Status: AC
  Administered 2022-04-02: 10 mL via EPIDURAL
  Filled 2022-04-02: qty 20

## 2022-04-02 NOTE — Progress Notes (Signed)
PROVIDER NOTE: Interpretation of information contained herein should be left to medically-trained personnel. Specific patient instructions are provided elsewhere under "Patient Instructions" section of medical record. This document was created in part using STT-dictation technology, any transcriptional errors that may result from this process are unintentional.  Patient: Darren Allen Type: Established DOB: Oct 02, 1965 MRN: [id number] PCP: Jon Billings, NP  Service: Procedure DOS: 04/02/2022 Setting: Ambulatory Location: Ambulatory outpatient facility Delivery: Face-to-face Provider: Gaspar Cola, MD Specialty: Interventional Pain Management Specialty designation: 09 Location: Outpatient facility Ref. Prov.: Jon Billings, NP    Interventional Therapy      Procedure: Caudal Epidural Steroid Injection   #1  Laterality: Midline aiming left Level: Sacrococcygeal ligament  Imaging: Fluoroscopy-guided         Anesthesia: Local anesthesia (1-2% Lidocaine) Anxiolysis: None                 Sedation: No Sedation                       DOS: 04/02/2022  Performed by: Gaspar Cola, MD  Purpose: Diagnostic/Therapeutic Indications: Low back and lower extremity pain severe enough to impact quality of life or function. Rationale (medical necessity): procedure needed and proper for the diagnosis and/or treatment of Darren Allen medical symptoms and needs. 1. Lumbosacral lateral recess stenosis (Left: L5-S1)   2. Lumbosacral radiculopathy at S1 (Left)   3. Diabetic peripheral neuropathy ([place])   4. DDD (degenerative disc disease), lumbosacral   5. 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 Abnormal MRI, lumbar spine (03/11/2020)    NAS-11 Pain score:   Pre-procedure: 3 /10   Post-procedure: 3 /10     Target: Lumbosacral epidural canal  Location: Epidural space  Region: Caudal canal Approach: Percutaneous  Type of procedure: Epidural block  Position   Prep  Materials:  Position: Prone  Prep solution: DuraPrep (Iodine Povacrylex [0.7% available iodine] and Isopropyl Alcohol, 74% w/w) Prep Area: Entire posterior lumbosacral area  Materials:  Tray: Epidural Needle(s):  Type: Epidural  Gauge (G): 17  Length: 3.5-in  Qty: 1  Pre-op H&P Assessment:  Darren Allen is a 57 y.o. (year old), male patient, seen today for interventional treatment. He  has a past surgical history that includes Appendectomy; Incision and drainage perirectal abscess (N/A, 02/04/2015); Rectal exam under anesthesia (02/04/2015); ORIF ankle fracture (Left, 03/23/2017); Syndesmosis repair (Left, 03/23/2017); Colonoscopy with propofol (N/A, 12/31/2021); Esophagogastroduodenoscopy (N/A, 12/31/2021); Kidney stone surgery (Right); lung mass removal (N/A); Xi robotic assisted paraesophageal hernia repair (N/A, 01/20/2022); Insertion of mesh (85/10/8500); and Umbilical hernia repair (01/20/2022). Darren Allen has a current medication list which includes the following prescription(s): acetaminophen, amitriptyline, ascorbic acid, ezetimibe, ferrous sulfate, fluticasone, gabapentin, hydrochlorothiazide, hydrocodone-acetaminophen, humulin 70/30 kwikpen, metformin, montelukast, naloxone, ondansetron, pantoprazole, proair hfa, rosuvastatin, tadalafil, testosterone cypionate, trelegy ellipta, triamcinolone cream, trulicity, hydrocodone-acetaminophen, and hydrocodone-acetaminophen. His primarily concern today is the Back Pain (lower)  Initial Vital Signs:  Pulse/HCG Rate: 70ECG Heart Rate: 91 Temp: (!) 97.2 F (36.2 C) Resp: 18 BP: (!) 140/90 SpO2: 100 %  BMI: Estimated body mass index is 29.66 kg/m as calculated from the following:   Height as of this encounter: '6\' 2"'$  (1.88 m).   Weight as of this encounter: 231 lb (104.8 kg).  Risk Assessment: Allergies: Reviewed. He is allergic to glipizide, cephalexin, and duloxetine.  Allergy Precautions: None required Coagulopathies: Reviewed. None  identified.  Blood-thinner therapy: None at this time Active Infection(s): Reviewed. None identified. Darren Allen  is afebrile  Site Confirmation: Darren Allen was asked to confirm the procedure and laterality before marking the site Procedure checklist: Completed Consent: Before the procedure and under the influence of no sedative(s), amnesic(s), or anxiolytics, the patient was informed of the treatment options, risks and possible complications. To fulfill our ethical and legal obligations, as recommended by the American Medical Association's Code of Ethics, I have informed the patient of my clinical impression; the nature and purpose of the treatment or procedure; the risks, benefits, and possible complications of the intervention; the alternatives, including doing nothing; the risk(s) and benefit(s) of the alternative treatment(s) or procedure(s); and the risk(s) and benefit(s) of doing nothing. The patient was provided information about the general risks and possible complications associated with the procedure. These may include, but are not limited to: failure to achieve desired goals, infection, bleeding, organ or nerve damage, allergic reactions, paralysis, and death. In addition, the patient was informed of those risks and complications associated to Spine-related procedures, such as failure to decrease pain; infection (i.e.: Meningitis, epidural or intraspinal abscess); bleeding (i.e.: epidural hematoma, subarachnoid hemorrhage, or any other type of intraspinal or peri-dural bleeding); organ or nerve damage (i.e.: Any type of peripheral nerve, nerve root, or spinal cord injury) with subsequent damage to sensory, motor, and/or autonomic systems, resulting in permanent pain, numbness, and/or weakness of one or several areas of the body; allergic reactions; (i.e.: anaphylactic reaction); and/or death. Furthermore, the patient was informed of those risks and complications associated with the medications.  These include, but are not limited to: allergic reactions (i.e.: anaphylactic or anaphylactoid reaction(s)); adrenal axis suppression; blood sugar elevation that in diabetics may result in ketoacidosis or comma; water retention that in patients with history of congestive heart failure may result in shortness of breath, pulmonary edema, and decompensation with resultant heart failure; weight gain; swelling or edema; medication-induced neural toxicity; particulate matter embolism and blood vessel occlusion with resultant organ, and/or nervous system infarction; and/or aseptic necrosis of one or more joints. Finally, the patient was informed that Medicine is not an exact science; therefore, there is also the possibility of unforeseen or unpredictable risks and/or possible complications that may result in a catastrophic outcome. The patient indicated having understood very clearly. We have given the patient no guarantees and we have made no promises. Enough time was given to the patient to ask questions, all of which were answered to the patient's satisfaction. Mr. Sloane has indicated that he wanted to continue with the procedure. Attestation: I, the ordering provider, attest that I have discussed with the patient the benefits, risks, side-effects, alternatives, likelihood of achieving goals, and potential problems during recovery for the procedure that I have provided informed consent. Date  Time: 04/02/2022 10:03 AM  Imaging Guidance (Spinal):          Type of Imaging Technique: Fluoroscopy Guidance (Spinal) Indication(s): Assistance in needle guidance and placement for procedures requiring needle placement in or near specific anatomical locations not easily accessible without such assistance. Exposure Time: Please see nurses notes. Contrast: Before injecting any contrast, we confirmed that the patient did not have an allergy to iodine, shellfish, or radiological contrast. Once satisfactory needle placement  was completed at the desired level, radiological contrast was injected. Contrast injected under live fluoroscopy. No contrast complications. See chart for type and volume of contrast used. Fluoroscopic Guidance: I was personally present during the use of fluoroscopy. "Tunnel Vision Technique" used to obtain the best possible view of the target area. Parallax error  corrected before commencing the procedure. "Direction-depth-direction" technique used to introduce the needle under continuous pulsed fluoroscopy. Once target was reached, antero-posterior, oblique, and lateral fluoroscopic projection used confirm needle placement in all planes. Images permanently stored in EMR.  Interpretation: I personally interpreted the imaging intraoperatively. Adequate needle placement confirmed in multiple planes. Appropriate spread of contrast into desired area was observed. No evidence of afferent or efferent intravascular uptake. No intrathecal or subarachnoid spread observed. Permanent images saved into the patient's record.  Injection of contrast was seen to have had limited spread within the sacral caudal canal with most of the contrast exiting through the lower sacral foramina and none of the contrast observed to have reached the top of the S1 vertebral body.  Pre-Procedure Preparation:  Monitoring: As per clinic protocol. Respiration, ETCO2, SpO2, BP, heart rate and rhythm monitor placed and checked for adequate function Safety Precautions: Patient was assessed for positional comfort and pressure points before starting the procedure. Time-out: I initiated and conducted the "Time-out" before starting the procedure, as per protocol. The patient was asked to participate by confirming the accuracy of the "Time Out" information. Verification of the correct person, site, and procedure were performed and confirmed by me, the nursing staff, and the patient. "Time-out" conducted as per Joint Commission's Universal Protocol  (UP.01.01.01). Time: 1110  Description  Narrative of Procedure:          Technical description of procedure:  Safety Precautions: Aspiration looking for blood return was conducted prior to all injections. At no point did we inject any substances, as a needle was being advanced. No attempts were made at seeking any paresthesias. Safe injection practices and needle disposal techniques used. Medications properly checked for expiration dates. SDV (single dose vial) medications used. Description of the Procedure: Protocol guidelines were followed. The patient was placed in position over the fluoroscopy table. The target area was identified and the area prepped in the usual manner. Skin & deeper tissues infiltrated with local anesthetic. Appropriate amount of time allowed to pass for local anesthetics to take effect. The procedure needle was then advanced to the target area. Proper needle placement secured. Negative aspiration confirmed.  Solution injected in intermittent fashion, asking for systemic symptoms every 0.5cc of injectate. The needle/catheter were removed and the area cleansed, making sure to leave some of the prepping solution back to take advantage of its long term bactericidal properties.  Vitals:   04/02/22 0956 04/02/22 1108 04/02/22 1114 04/02/22 1119  BP: (!) 140/90 (!) 138/93 (!) 137/96 (!) 159/97  Pulse: 70     Resp:  '18 16 18  '$ Temp: (!) 97.2 F (36.2 C)     SpO2: 100% 98% 98% 98%  Weight: 231 lb (104.8 kg)     Height: '6\' 2"'$  (1.88 m)        Start Time: 1110 hrs. End Time: 1119 hrs.  Post-operative Assessment:  Post-procedure Vital Signs:  Pulse/HCG Rate: 7098 Temp: (!) 97.2 F (36.2 C) Resp: 18 BP: (!) 159/97 SpO2: 98 %  EBL: None  Complications: No immediate post-treatment complications observed by team, or reported by patient.  Note: The patient tolerated the entire procedure well. A repeat set of vitals were taken after the procedure and the patient was kept  under observation following institutional policy, for this type of procedure. Post-procedural neurological assessment was performed, showing return to baseline, prior to discharge. The patient was provided with post-procedure discharge instructions, including a section on how to identify potential problems. Should any problems arise  concerning this procedure, the patient was given instructions to immediately contact us, at any time, without hesitation. In any case, we plan to contact the patient by telephone for a follow-up status report regarding this interventional procedure.  Comments:  No additional relevant information.  Plan of Care  Orders:  Orders Placed This Encounter  Procedures   Caudal Epidural Injection    Scheduling Instructions:     Laterality: Left-sided     Level(s): Sacrococcygeal canal (Tailbone area)     Sedation: Patient's choice     Timeframe: Today    Order Specific Question:   Where will this procedure be performed?    Answer:   [facility]   DG [facility] C-ARM 1-60 MIN NO REPORT    Intraoperative interpretation by procedural physician at [place].    Standing Status:   Standing    Number of Occurrences:   1    Order Specific Question:   Reason for exam:    Answer:   Assistance in needle guidance and placement for procedures requiring needle placement in or near specific anatomical locations not easily accessible without such assistance.   Informed Consent Details: Physician/Practitioner Attestation; Transcribe to consent form and obtain patient signature    Nursing Order: Transcribe to consent form and obtain patient signature. Note: Always confirm laterality of pain with Mr. Albus, before procedure.    Order Specific Question:   Physician/Practitioner attestation of informed consent for procedure/surgical case    Answer:   I, the physician/practitioner, attest that I have discussed with the patient the benefits, risks, side effects,  alternatives, likelihood of achieving goals and potential problems during recovery for the procedure that I have provided informed consent.    Order Specific Question:   Procedure    Answer:   Caudal epidural steroid injection    Order Specific Question:   Physician/Practitioner performing the procedure    Answer:   Rayce Brahmbhatt A. Dossie Arbour, MD    Order Specific Question:   Indication/Reason    Answer:   Low back pain and lower extremity pain secondary to lumbosacral radiculitis   Provide equipment / supplies at bedside    Procedural tray: Epidural Tray (Disposable  single use) Skin infiltration needle: Regular 1.5-in, 25-G, (x1) Block needle size: Regular standard Catheter: No catheter required    Standing Status:   Standing    Number of Occurrences:   1    Order Specific Question:   Specify    Answer:   Epidural Tray   Chronic Opioid Analgesic:  Hydrocodone/APAP 5/325 tablet, 1 tab p.o. twice daily (#60) MME/day: 10 mg/day   Medications ordered for procedure: Meds ordered this encounter  Medications   iohexol (OMNIPAQUE) 180 MG/ML injection 10 mL    Must be Myelogram-compatible. If not available, you may substitute with a water-soluble, non-ionic, hypoallergenic, myelogram-compatible radiological contrast medium.   lidocaine (XYLOCAINE) 2 % (with pres) injection 400 mg   pentafluoroprop-tetrafluoroeth (GEBAUERS) aerosol   sodium chloride flush (NS) 0.9 % injection 2 mL   ropivacaine (PF) 2 mg/mL (0.2%) (NAROPIN) injection 2 mL   triamcinolone acetonide (KENALOG-40) injection 40 mg   Medications administered: We administered iohexol, lidocaine, pentafluoroprop-tetrafluoroeth, sodium chloride flush, ropivacaine (PF) 2 mg/mL (0.2%), and triamcinolone acetonide.  See the medical record for exact dosing, route, and time of administration.  Follow-up plan:   Return in about 2 weeks (around 04/16/2022) for Proc-day (T,Th), (F2F), (PPE).       Interventional Therapies  Risk  Complexity  Considerations:  Estimated body mass index is 33.91 kg/m as calculated from the following:   Height as of this encounter: 6' (1.829 m).   Weight as of this encounter: 250 lb (113.4 kg). WNL   Planned  Pending:      Under consideration:   Diagnostic bilateral lumbar facet MBB #1  Diagnostic/therapeutic left L5 & S1 TFESI #1  Possible spinal cord stimulator trial  Therapeutic left L5-S1 percutaneous discectomy with "[employer] Dekompressor" system    Completed:   Diagnostic left L5-S1 LESI x1 (12/16/2021) (100/100/100/LBP:85  LEP:100)  Therapeutic bilateral Qutenza neurolytic treatment x1 (12/23/2021)  (09/08/2021 & 10/29/2021) referral to physical therapy for evaluation and treatment of low back pain.   Completed by other providers:   EMG/PNCV of lower extremity (02/20/2020) by Dr. Jennings Books (generalized sensorimotor peripheral neuropathy; superimposed left S1 radiculopathy)   Therapeutic  Palliative (PRN) options:   None established     Recent Visits Date Type Provider Dept  01/13/22 Office Visit Milinda Pointer, MD [facility]  Showing recent visits within past 90 days and meeting all other requirements Today's Visits Date Type Provider Dept  04/02/22 Procedure visit Milinda Pointer, MD [facility]  Showing today's visits and meeting all other requirements Future Appointments Date Type Provider Dept  04/16/22 Appointment Milinda Pointer, [facility]  04/20/22 Appointment Milinda Pointer, MD [facility]  Showing future appointments within next 90 days and meeting all other requirements  Disposition: Discharge home  Discharge (Date  Time): 04/02/2022; 1130 hrs.   Primary Care Physician: Jon Billings, NP Location: [facility] Note by: Gaspar Cola, MD Date: 04/02/2022; Time: 12:54 PM  Disclaimer:  Medicine is not an Chief Strategy Officer. The only guarantee in medicine is that nothing  is guaranteed. It is important to note that the decision to proceed with this intervention was based on the information collected from the patient. The Data and conclusions were drawn from the patient's questionnaire, the interview, and the physical examination. Because the information was provided in large part by the patient, it cannot be guaranteed that it has not been purposely or unconsciously manipulated. Every effort has been made to obtain as much relevant data as possible for this evaluation. It is important to note that the conclusions that lead to this procedure are derived in large part from the available data. Always take into account that the treatment will also be dependent on availability of resources and existing treatment guidelines, considered by other Pain Management Practitioners as being common knowledge and practice, at the time of the intervention. For Medico-Legal purposes, it is also important to point out that variation in procedural techniques and pharmacological choices are the acceptable norm. The indications, contraindications, technique, and results of the above procedure should only be interpreted and judged by a Board-Certified Interventional Pain Specialist with extensive familiarity and expertise in the same exact procedure and technique.

## 2022-04-02 NOTE — Patient Instructions (Addendum)
Pain Management Discharge Instructions  General Discharge Instructions :  If you need to reach your doctor call: Monday-Friday 8:00 am - 4:00 pm at [phone] or toll free [phone].  After clinic hours [phone] to have operator reach doctor.  Bring all of your medication bottles to all your appointments in the pain clinic.  To cancel or reschedule your appointment with Pain Management please remember to call 24 hours in advance to avoid a fee.  Refer to the educational materials which you have been given on: General Risks, I had my Procedure. Discharge Instructions, Post Sedation.  Post Procedure Instructions:  The drugs you were given will stay in your system until tomorrow, so for the next 24 hours you should not drive, make any legal decisions or drink any alcoholic beverages.  You may eat anything you prefer, but it is better to start with liquids then soups and crackers, and gradually work up to solid foods.  Please notify your doctor immediately if you have any unusual bleeding, trouble breathing or pain that is not related to your normal pain.  Depending on the type of procedure that was done, some parts of your body may feel week and/or numb.  This usually clears up by tonight or the next day.  Walk with the use of an assistive device or accompanied by an adult for the 24 hours.  You may use ice on the affected area for the first 24 hours.  Put ice in a Ziploc bag and cover with a towel and place against area 15 minutes on 15 minutes off.  You may switch to heat after 24 hours.Epidural Steroid Injection Patient Information  Description: The epidural space surrounds the nerves as they exit the spinal cord.  In some patients, the nerves can be compressed and inflamed by a bulging disc or a tight spinal canal (spinal stenosis).  By injecting steroids into the epidural space, we can bring irritated nerves into direct contact with a potentially helpful medication.  These  steroids act directly on the irritated nerves and can reduce swelling and inflammation which often leads to decreased pain.  Epidural steroids may be injected anywhere along the spine and from the neck to the low back depending upon the location of your pain.   After numbing the skin with local anesthetic (like Novocaine), a small needle is passed into the epidural space slowly.  You may experience a sensation of pressure while this is being done.  The entire block usually last less than 10 minutes.  Conditions which may be treated by epidural steroids:  Low back and leg pain Neck and arm pain Spinal stenosis Post-laminectomy syndrome Herpes zoster (shingles) pain Pain from compression fractures  Preparation for the injection:  Do not eat any solid food or dairy products within 8 hours of your appointment.  You may drink clear liquids up to 3 hours before appointment.  Clear liquids include water, black coffee, juice or soda.  No milk or cream please. You may take your regular medication, including pain medications, with a sip of water before your appointment  Diabetics should hold regular insulin (if taken separately) and take 1/2 normal NPH dos the morning of the procedure.  Carry some sugar containing items with you to your appointment. A driver must accompany you and be prepared to drive you home after your procedure.  Bring all your current medications with your. An IV may be inserted and sedation may be given at the discretion of the physician.  A blood pressure cuff, EKG and other monitors will often be applied during the procedure.  Some patients may need to have extra oxygen administered for a short period. You will be asked to provide medical information, including your allergies, prior to the procedure.  We must know immediately if you are taking blood thinners (like Coumadin/Warfarin)  Or if you are allergic to IV iodine contrast (dye). We must know if you could possible be  pregnant.  Possible side-effects: Bleeding from needle site Infection (rare, may require surgery) Nerve injury (rare) Numbness & tingling (temporary) Difficulty urinating (rare, temporary) Spinal headache ( a headache worse with upright posture) Light -headedness (temporary) Pain at injection site (several days) Decreased blood pressure (temporary) Weakness in arm/leg (temporary) Pressure sensation in back/neck (temporary)  Call if you experience: Fever/chills associated with headache or increased back/neck pain. Headache worsened by an upright position. New onset weakness or numbness of an extremity below the injection site Hives or difficulty breathing (go to the emergency room) Inflammation or drainage at the infection site Severe back/neck pain Any new symptoms which are concerning to you  Please note:  Although the local anesthetic injected can often make your back or neck feel good for several hours after the injection, the pain will likely return.  It takes 3-7 days for steroids to work in the epidural space.  You may not notice any pain relief for at least that one week.  If effective, we will often do a series of three injections spaced 3-6 weeks apart to maximally decrease your pain.  After the initial series, we generally will wait several months before considering a repeat injection of the same type.  If you have any questions, please call [phone] [facility] ____________________________________________________________________________________________  Post-Procedure Discharge Instructions  Instructions: Apply ice:  Purpose: This will minimize any swelling and discomfort after procedure.  When: Day of procedure, as soon as you get home. How: Fill a plastic sandwich bag with crushed ice. Cover it with a small towel and apply to injection site. How long: (15 min on, 15 min off) Apply for 15 minutes then remove x 15 minutes.   Repeat sequence on day of procedure, until you go to bed. Apply heat:  Purpose: To treat any soreness and discomfort from the procedure. When: Starting the next day after the procedure. How: Apply heat to procedure site starting the day following the procedure. How long: May continue to repeat daily, until discomfort goes away. Food intake: Start with clear liquids (like water) and advance to regular food, as tolerated.  Physical activities: Keep activities to a minimum for the first 8 hours after the procedure. After that, then as tolerated. Driving: If you have received any sedation, be responsible and do not drive. You are not allowed to drive for 24 hours after having sedation. Blood thinner: (Applies only to those taking blood thinners) You may restart your blood thinner 6 hours after your procedure. Insulin: (Applies only to Diabetic patients taking insulin) As soon as you can eat, you may resume your normal dosing schedule. Infection prevention: Keep procedure site clean and dry. Shower daily and clean area with soap and water. Post-procedure Pain Diary: Extremely important that this be done correctly and accurately. Recorded information will be used to determine the next step in treatment. For the purpose of accuracy, follow these rules: Evaluate only the area treated. Do not report or include pain from an untreated area. For the purpose of this evaluation,  ignore all other areas of pain, except for the treated area. After your procedure, avoid taking a long nap and attempting to complete the pain diary after you wake up. Instead, set your alarm clock to go off every hour, on the hour, for the initial 8 hours after the procedure. Document the duration of the numbing medicine, and the relief you are getting from it. Do not go to sleep and attempt to complete it later. It will not be accurate. If you received sedation, it is likely that you were given a medication that may cause amnesia. Because  of this, completing the diary at a later time may cause the information to be inaccurate. This information is needed to plan your care. Follow-up appointment: Keep your post-procedure follow-up evaluation appointment after the procedure (usually 2 weeks for most procedures, 6 weeks for radiofrequencies). DO NOT FORGET to bring you pain diary with you.   Expect: (What should I expect to see with my procedure?) From numbing medicine (AKA: Local Anesthetics): Numbness or decrease in pain. You may also experience some weakness, which if present, could last for the duration of the local anesthetic. Onset: Full effect within 15 minutes of injected. Duration: It will depend on the type of local anesthetic used. On the average, 1 to 8 hours.  From steroids (Applies only if steroids were used): Decrease in swelling or inflammation. Once inflammation is improved, relief of the pain will follow. Onset of benefits: Depends on the amount of swelling present. The more swelling, the longer it will take for the benefits to be seen. In some cases, up to 10 days. Duration: Steroids will stay in the system x 2 weeks. Duration of benefits will depend on multiple posibilities including persistent irritating factors. Side-effects: If present, they may typically last 2 weeks (the duration of the steroids). Frequent: Cramps (if they occur, drink Gatorade and take over-the-counter Magnesium 450-500 mg once to twice a day); water retention with temporary weight gain; increases in blood sugar; decreased immune system response; increased appetite. Occasional: Facial flushing (red, warm cheeks); mood swings; menstrual changes. Uncommon: Long-term decrease or suppression of natural hormones; bone thinning. (These are more common with higher doses or more frequent use. This is why we prefer that our patients avoid having any injection therapies in other practices.)  Very Rare: Severe mood changes; psychosis; aseptic necrosis. From  procedure: Some discomfort is to be expected once the numbing medicine wears off. This should be minimal if ice and heat are applied as instructed.  Call if: (When should I call?) You experience numbness and weakness that gets worse with time, as opposed to wearing off. New onset bowel or bladder incontinence. (Applies only to procedures done in the spine)  Emergency Numbers: Durning business hours (Monday - Thursday, 8:00 AM - 4:00 PM) (Friday, 9:00 AM - 12:00 Noon): [phone] After hours: [phone] NOTE: If you are having a problem and are unable connect with, or to talk to a provider, then go to your nearest urgent care or emergency department. If the problem is serious and urgent, please call 911. ____________________________________________________________________________________________    ____________________________________________________________________________________________  Patient Information update  To: All of our patients.  Re: Name change.  It has been made official that our current name, "[facility]"   will soon be changed to "[facility]".   The purpose of this change is to eliminate any confusion created by the concept of  our practice being a "Medication Management Pain Clinic". In the past this has led to the misconception that we treat pain primarily by the use of prescription medications.  Nothing can be farther from the truth.   Understanding PAIN MANAGEMENT: To further understand what our practice does, you first have to understand that "Pain Management" is a subspecialty that requires additional training once a physician has completed their specialty training, which can be in either Anesthesia, Neurology, Psychiatry, or Physical Medicine and Rehabilitation (PMR). Each one of these contributes to the final approach taken by each physician to the  management of their patient's pain. To be a "Pain Management Specialist" you must have first completed one of the specialty trainings below.  Anesthesiologists - trained in clinical pharmacology and interventional techniques such as nerve blockade and regional as well as central neuroanatomy. They are trained to block pain before, during, and after surgical interventions.  Neurologists - trained in the diagnosis and pharmacological treatment of complex neurological conditions, such as Multiple Sclerosis, Parkinson's, spinal cord injuries, and other systemic conditions that may be associated with symptoms that may include but are not limited to pain. They tend to rely primarily on the treatment of chronic pain using prescription medications.  Psychiatrist - trained in conditions affecting the psychosocial wellbeing of patients including but not limited to depression, anxiety, schizophrenia, personality disorders, addiction, and other substance use disorders that may be associated with chronic pain. They tend to rely primarily on the treatment of chronic pain using prescription medications.   Physical Medicine and Rehabilitation (PMR) physicians, also known as physiatrists - trained to treat a wide variety of medical conditions affecting the brain, spinal cord, nerves, bones, joints, ligaments, muscles, and tendons. Their training is primarily aimed at treating patients that have suffered injuries that have caused severe physical impairment. Their training is primarily aimed at the physical therapy and rehabilitation of those patients. They may also work alongside orthopedic surgeons or neurosurgeons using their expertise in assisting surgical patients to recover after their surgeries.  INTERVENTIONAL PAIN MANAGEMENT is sub-subspecialty of Pain Management.  Our physicians are Board-certified in Anesthesia, Pain Management, and Interventional Pain Management.  This meaning that not only have they been  trained and Board-certified in their specialty of Anesthesia, and subspecialty of Pain Management, but they have also received further training in the sub-subspecialty of Interventional Pain Management, in order to become Board-certified as INTERVENTIONAL PAIN MANAGEMENT SPECIALIST.    Mission: Our goal is to use our skills in  [place] as alternatives to the chronic use of prescription opioid medications for the treatment of pain. To make this more clear, we have changed our name to reflect what we do and offer. We will continue to offer medication management assessment and recommendations, but we will not be taking over any patient's medication management.  ____________________________________________________________________________________________

## 2022-04-02 NOTE — Progress Notes (Signed)
Safety precautions to be maintained throughout the outpatient stay will include: orient to surroundings, keep bed in low position, maintain call bell within reach at all times, provide assistance with transfer out of bed and ambulation.  

## 2022-04-03 ENCOUNTER — Telehealth: Payer: Self-pay | Admitting: *Deleted

## 2022-04-03 NOTE — Telephone Encounter (Signed)
No problems post procedure. 

## 2022-04-08 ENCOUNTER — Encounter: Payer: Self-pay | Admitting: Nurse Practitioner

## 2022-04-08 NOTE — Telephone Encounter (Signed)
Pt scheduled  

## 2022-04-09 ENCOUNTER — Encounter: Payer: Self-pay | Admitting: Nurse Practitioner

## 2022-04-09 ENCOUNTER — Telehealth (INDEPENDENT_AMBULATORY_CARE_PROVIDER_SITE_OTHER): Payer: 59 | Admitting: Nurse Practitioner

## 2022-04-09 DIAGNOSIS — G479 Sleep disorder, unspecified: Secondary | ICD-10-CM | POA: Diagnosis not present

## 2022-04-09 NOTE — Progress Notes (Signed)
There were no vitals taken for this visit.   Subjective:    Patient ID: Darren Allen, male    DOB: [birth year], 57 y.o.   MRN: [id number]  HPI: Darren Allen is a 57 y.o. male  Chief Complaint  Patient presents with   Insomnia    Pt states he has been having trouble sleeping for at least the last year. States he has been having trouble going to sleep and staying. States he is currently taking the Amitriptyline.    INSOMNIA Duration: years Satisfied with sleep quality: no Difficulty falling asleep: yes Difficulty staying asleep: yes Waking a few hours after sleep onset: yes Early morning awakenings: yes Daytime hypersomnolence: no Wakes feeling refreshed: no Good sleep hygiene: no Apnea: yes- uses a CPAP Snoring: yes Depressed/anxious mood: no Recent stress: no Restless legs/nocturnal leg cramps: yes Chronic pain/arthritis: yes History of sleep study: yes Treatments attempted: amitriptyline, hydroxyzine, trazodone  Relevant past medical, surgical, family and social history reviewed and updated as indicated. Interim medical history since our last visit reviewed. Allergies and medications reviewed and updated.  Review of Systems  Psychiatric/Behavioral:  Positive for sleep disturbance. Negative for dysphoric mood. The patient is not nervous/anxious.     Per HPI unless specifically indicated above     Objective:    There were no vitals taken for this visit.  Wt Readings from Last 3 Encounters:  04/02/22 231 lb (104.8 kg)  03/02/22 231 lb (104.8 kg)  02/20/22 229 lb 8 oz (104.1 kg)    Physical Exam Vitals and nursing note reviewed.  Constitutional:      General: He is not in acute distress.    Appearance: He is not ill-appearing.  HENT:     Head: Normocephalic.     Right Ear: Hearing normal.     Left Ear: Hearing normal.     Nose: Nose normal.  Pulmonary:     Effort: Pulmonary effort is normal. No respiratory distress.  Neurological:     Mental  Status: He is alert.  Psychiatric:        Mood and Affect: Mood normal.        Behavior: Behavior normal.        Thought Content: Thought content normal.        Judgment: Judgment normal.     Results for orders placed or performed in visit on 03/23/22  Testosterone  Result Value Ref Range   Testosterone 559 264 - 916 ng/dL      Assessment & Plan:   Problem List Items Addressed This Visit   None Visit Diagnoses     Sleep disturbance    -  Primary   Ongoing x several years. Will increase Amitripytiline to '40mg'$ .  '50mg'$  was too strong for patient. If not improved can try Unisom. Follow up in 2 weeks.        Follow up plan: Return in about 2 weeks (around 04/23/2022) for sleep (virtual).   This visit was completed via MyChart due to the restrictions of the COVID-19 pandemic. All issues as above were discussed and addressed. Physical exam was done as above through visual confirmation on MyChart. If it was felt that the patient should be evaluated in the office, they were directed there. The patient verbally consented to this visit. Location of the patient: Home Location of the provider: Office Those involved with this call:  Provider: Jon Billings, NP CMA: Yvonna Alanis, CMA Front Desk/Registration: Lynnell Catalan This encounter was conducted via video.  I spent 30 dedicated to the care of this patient on the date of this encounter to include previsit review of symptoms, plan of care and follow up, face to face time with the patient, and post visit ordering of testing.

## 2022-04-15 ENCOUNTER — Encounter: Payer: Self-pay | Admitting: Nurse Practitioner

## 2022-04-16 ENCOUNTER — Ambulatory Visit: Payer: 59 | Admitting: Pain Medicine

## 2022-04-19 NOTE — Patient Instructions (Signed)
____________________________________________________________________________________________  Opioid Pain Medication Update  To: All patients taking opioid pain medications. (I.e.: hydrocodone, hydromorphone, oxycodone, oxymorphone, morphine, codeine, methadone, tapentadol, tramadol, buprenorphine, fentanyl, etc.)  Re: Updated review of side effects and adverse reactions of opioid analgesics, as well as new information about long term effects of this class of medications.  Direct risks of long-term opioid therapy are not limited to opioid addiction and overdose. Potential medical risks include serious fractures, breathing problems during sleep, hyperalgesia, immunosuppression, chronic constipation, bowel obstruction, myocardial infarction, and tooth decay secondary to xerostomia.  Unpredictable adverse effects that can occur even if you take your medication correctly: Cognitive impairment, respiratory depression, and death. Most people think that if they take their medication "correctly", and "as instructed", that they will be safe. Nothing could be farther from the truth. In reality, a significant amount of recorded deaths associated with the use of opioids has occurred in individuals that had taken the medication for a long time, and were taking their medication correctly. The following are examples of how this can happen: Patient taking his/her medication for a long time, as instructed, without any side effects, is given a certain antibiotic or another unrelated medication, which in turn triggers a "Drug-to-drug interaction" leading to disorientation, cognitive impairment, impaired reflexes, respiratory depression or an untoward event leading to serious bodily harm or injury, including death.  Patient taking his/her medication for a long time, as instructed, without any side effects, develops an acute impairment of liver and/or kidney function. This will lead to a rapid inability of the body to  breakdown and eliminate their pain medication, which will result in effects similar to an "overdose", but with the same medicine and dose that they had always taken. This again may lead to disorientation, cognitive impairment, impaired reflexes, respiratory depression or an untoward event leading to serious bodily harm or injury, including death.  A similar problem will occur with patients as they grow older and their liver and kidney function begins to decrease as part of the aging process.  Background information: Historically, the original case for using long-term opioid therapy to treat chronic noncancer pain was based on safety assumptions that subsequent experience has called into question. In 1996, the American Pain Society and the [place] [employer] issued a consensus statement supporting long-term opioid therapy. This statement acknowledged the dangers of opioid prescribing but concluded that the risk for addiction was low; respiratory depression induced by opioids was short-lived, occurred mainly in opioid-naive patients, and was antagonized by pain; tolerance was not a common problem; and efforts to control diversion should not constrain opioid prescribing. This has now proven to be wrong. Experience regarding the risks for opioid addiction, misuse, and overdose in community practice has failed to support these assumptions.  According to the Centers for Disease Control and Prevention, fatal overdoses involving opioid analgesics have increased sharply over the past decade. Currently, more than 96,700 people die from drug overdoses every year. Opioids are a factor in 7 out of every 10 overdose deaths. Deaths from drug overdose have surpassed motor vehicle accidents as the leading cause of death for individuals between the ages of 3 and 36.  Clinical data suggest that neuroendocrine dysfunction may be very common in both men and women, potentially causing hypogonadism, erectile  dysfunction, infertility, decreased libido, osteoporosis, and depression. Recent studies linked higher opioid dose to increased opioid-related mortality. Controlled observational studies reported that long-term opioid therapy may be associated with increased risk for cardiovascular events. Subsequent meta-analysis concluded  that the safety of long-term opioid therapy in elderly patients has not been proven.   Side Effects and adverse reactions: Common side effects: Drowsiness (sedation). Dizziness. Nausea and vomiting. Constipation. Physical dependence -- Dependence often manifests with withdrawal symptoms when opioids are discontinued or decreased. Tolerance -- As you take repeated doses of opioids, you require increased medication to experience the same effect of pain relief. Respiratory depression -- This can occur in healthy people, especially with higher doses. However, people with COPD, asthma or other lung conditions may be even more susceptible to fatal respiratory impairment.  Uncommon side effects: An increased sensitivity to feeling pain and extreme response to pain (hyperalgesia). Chronic use of opioids can lead to this. Delayed gastric emptying (the process by which the contents of your stomach are moved into your small intestine). Muscle rigidity. Immune system and hormonal dysfunction. Quick, involuntary muscle jerks (myoclonus). Arrhythmia. Itchy skin (pruritus). Dry mouth (xerostomia).  Long-term side effects: Chronic constipation. Sleep-disordered breathing (SDB). Increased risk of bone fractures. Hypothalamic-pituitary-adrenal dysregulation. Increased risk of overdose.  RISKS: Fractures and Falls:  Opioids increase the risk and incidence of falls. This is of particular importance in elderly patients.  Endocrine System:  Long-term administration is associated with endocrine abnormalities. Influences on both the hypothalamic-pituitary-adrenal axis?and the  hypothalamic-pituitary-gonadal axis have been demonstrated with consequent hypogonadism and adrenal insufficiency in both sexes. Hypogonadism and decreased levels of dehydroepiandrosterone sulfate have been reported in men and women. Endocrine effects can lead to: Amenorrhoea in women Reduced libido in both sexes Erectile dysfunction in men Infertility Depression and fatigue Patients (particularly women of childbearing age) should avoid opioids. There is insufficient evidence to recommend routine monitoring of asymptomatic patients taking opioids in the long-term for hormonal deficiencies.  Immune System: Human studies have demonstrated that opioids have an immunomodulating effect. These effects are mediated via opioid receptors both on immune effector cells and in the central nervous system. Opioids have been demonstrated to have adverse effects on antimicrobial response and anti-tumour surveillance. Buprenorphine has been demonstrated to have no impact on immune function.  Opioid Induced Hyperalgesia: Human studies have demonstrated that prolonged use of opioids can lead to a state of abnormal pain sensitivity, sometimes called opioid induced hyperalgesia (OIH). Opioid induced hyperalgesia is not usually seen in the absence of tolerance to opioid analgesia. Clinically, hyperalgesia may be diagnosed if the patient on long-term opioid therapy presents with increased pain. This might be qualitatively and anatomically distinct from pain related to disease progression or to breakthrough pain resulting from development of opioid tolerance. Pain associated with hyperalgesia tends to be more diffuse than the pre-existing pain and less defined in quality. Management of opioid induced hyperalgesia requires opioid dose reduction.  Cancer: Chronic opioid therapy has been associated with an increased risk of cancer among noncancer patients with chronic pain. This association was more evident in chronic  strong opioid users. Chronic opioid consumption causes significant pathological changes in the small intestine and colon. Epidemiological studies have found that there is a link between opium dependence and initiation of gastrointestinal cancers. Cancer is the second leading cause of death after cardiovascular disease. Chronic use of opioids can cause multiple conditions such as GERD, immunosuppression and renal damage as well as carcinogenic effects, which are associated with the incidence of cancers.   Mortality: Long-term opioid use has been associated with increased mortality among patients with chronic non-cancer pain (CNCP).  Prescription of long-acting opioids for chronic noncancer pain was associated with a significantly increased risk of all-cause mortality,  including deaths from causes other than overdose.  Reference: Von Korff M, Kolodny A, Deyo RA, Chou R. Long-term opioid therapy reconsidered. Ann Intern Med. 2011 Sep 6;155(5):325-8. doi: 10.7326/[id number]. PMID: 53664403; PMCID: [id number]. Morley Kos, Hayward RA, Dunn KM, [place] KP. Risk of adverse events in patients prescribed long-term opioids: A cohort study in the [place] Clinical Practice Research Datalink. Eur J Pain. 2019 May;23(5):908-922. doi: 10.1002/ejp.1357. Epub 2019 Jan 31. PMID: 75643329. Colameco S, Coren JS, Ciervo CA. Continuous opioid treatment for chronic noncancer pain: a time for moderation in prescribing. Postgrad Med. 2009 Jul;121(4):61-6. doi: 10.3810/pgm.2009.07.2032. PMID: 51884166. Heywood Bene RN, McLain SD, Blazina I, Rosalio Loud, Bougatsos C, Deyo RA. The effectiveness and risks of long-term opioid therapy for chronic pain: a systematic review for a [employer] of Health Pathways to [employer]. Ann Intern Med. 2015 Feb 17;162(4):276-86. doi: 06.3016/W10-9323. PMID: 55732202. Marjory Sneddon [facility], Makuc DM. NCHS Data Brief No. 22. [place]:  Centers for Disease Control and Prevention; 2009. Sep, Increase in Fatal Poisonings Involving Opioid Analgesics in the [place], 1999-2006. Song IA, Choi HR, Oh TK. Long-term opioid use and mortality in patients with chronic non-cancer pain: Ten-year follow-up study in [place] from 2010 through 2019. EClinicalMedicine. 2022 Jul 18;51:101558. doi: 10.1016/j.eclinm.2022.542706. PMID: 23762831; PMCID: [id number]. Huser, W., Schubert, T., Vogelmann, T. et al. All-cause mortality in patients with long-term opioid therapy compared with non-opioid analgesics for chronic non-cancer pain: a database study. [place] Med 18, 162 (2020). [email or web address].[email or web address].com/ Rashidian H, Roxy Cedar, Malekzadeh R, Haghdoost AA. An Ecological Study of the Association between Opiate Use and Incidence of Cancers. Addict Health. 2016 Fall;8(4):252-260. PMID: 10626948; PMCID: [id number].  Our Goal: Our goal is to control your pain with means other than the use of opioid pain medications.  Our Recommendation: Talk to your physician about coming off of these medications. We can assist you with the tapering down and stopping these medicines. Based on the new information, even if you cannot completely stop the medication, a decrease in the dose may be associated with a lesser risk. Ask for other means of controlling the pain. Decrease or eliminate those factors that significantly contribute to your pain such as smoking, obesity, and a diet heavily tilted towards "inflammatory" nutrients.  ____________________________________________________________________________________________     ____________________________________________________________________________________________  Carron Brazen Pain Medication Shortage  The [place] is experiencing worsening drug shortages. These have had a negative widespread effect on patient care and treatment. Not expected to improve any time soon. Predicted to last past  2029.   Drug shortage list (generic names) Oxycodone IR Oxycodone/APAP Oxymorphone IR Hydromorphone Hydrocodone/APAP Morphine  Where is the problem?  Manufacturing and supply level.  Will this shortage affect you?  Only if you take any of the above pain medications.  How? You may be unable to fill your prescription.  Your pharmacist may offer a "partial fill" of your prescription. (Warning: Do not accept partial fills.) Prescriptions partially filled cannot be transferred to another pharmacy. Read our Medication Rules and Regulation. Depending on how much medicine you are dependent on, you may experience withdrawals when unable to get the medication.  Recommendations: Consider ending your dependence on opioid pain medications. Ask your pain specialist to assist you with the process. Consider switching to a medication currently not in shortage, such as Buprenorphine. Talk to your pain specialist about this option. Consider decreasing your pain medication requirements by managing tolerance thru "Drug Holidays". This may help minimize withdrawals, should you run  out of medicine. Control your pain thru the use of non-pharmacological interventional therapies.   Your prescriber: Prescribers cannot be blamed for shortages. Medication manufacturing and supply issues cannot be fixed by the prescriber.   NOTE: The prescriber is not responsible for supplying the medication, or solving supply issues. Work with your pharmacist to solve it. The patient is responsible for the decision to take or continue taking the medication and for identifying and securing a legal supply source. By law, supplying the medication is the job and responsibility of the pharmacy. The prescriber is responsible for the evaluation, monitoring, and prescribing of these medications.   Prescribers will NOT: Re-issue prescriptions that have been partially filled. Re-issue prescriptions already sent to a pharmacy.  Re-send  prescriptions to a different pharmacy because yours did not have your medication. Ask pharmacist to order more medicine or transfer the prescription to another pharmacy. (Read below.)  New 2023 regulation: "November 14, 2021 Revised Regulation Allows DEA-Registered Pharmacies to Transfer Electronic Prescriptions at a Patient's Request [place] Patients now have the ability to request their electronic prescription be transferred to another pharmacy without having to go back to their practitioner to initiate the request. This revised regulation went into effect on Monday, November 10, 2021.     At a patient's request, a DEA-registered retail pharmacy can now transfer an electronic prescription for a controlled substance (schedules II-V) to another DEA-registered retail pharmacy. Prior to this change, patients would have to go through their practitioner to cancel their prescription and have it re-issued to a different pharmacy. The process was taxing and time consuming for both patients and practitioners.    The Drug Enforcement Administration [facility]) published its intent to revise the process for transferring electronic prescriptions on February 02, 2020.  The final rule was published in the federal register on October 09, 2021 and went into effect 30 days later.  Under the final rule, a prescription can only be transferred once between pharmacies, and only if allowed under existing state or other applicable law. The prescription must remain in its electronic form; may not be altered in any way; and the transfer must be communicated directly between two licensed pharmacists. It's important to note, any authorized refills transfer with the original prescription, which means the entire prescription will be filled at the same pharmacy".  Reference: [email or web address] [facility]  website announcement)  [email or web address] ([place])   [employer] / Vol. 88, No. 143 / Thursday, October 09, 2021 / Rules and Regulations DEPARTMENT OF JUSTICE  Drug Enforcement Administration  21 CFR Part 1306  [Docket No. [id number]]  RIN [id number] Transfer of Electronic Prescriptions for Schedules II-V Controlled Substances Between Pharmacies for Initial Filling  ____________________________________________________________________________________________     _______________________________________________________________________  Medication Rules  Purpose: To inform patients, and their family members, of our medication rules and regulations.  Applies to: All patients receiving prescriptions from our practice (written or electronic).  Pharmacy of record: This is the pharmacy where your electronic prescriptions will be sent. Make sure we have the correct one.  Electronic prescriptions: In compliance with the [place] (STOP) Act of 2017 (Session Lanny Cramp [phone]), effective March 16, 2018, all controlled substances must be electronically prescribed. Written prescriptions, faxing, or calling prescriptions to a pharmacy will no longer be done.  Prescription refills: These will be provided only during in-person appointments. No medications will be renewed without a "face-to-face" evaluation with your provider. Applies to  all prescriptions.  NOTE: The following applies primarily to controlled substances (Opioid* Pain Medications).   Type of encounter (visit): For patients receiving controlled substances, face-to-face visits are required. (Not an option and not up to the patient.)  Patient's responsibilities: Pain Pills: Bring all pain pills to every appointment (except for procedure appointments). Pill Bottles: Bring pills in original pharmacy bottle. Bring  bottle, even if empty. Always bring the bottle of the most recent fill.  Medication refills: You are responsible for knowing and keeping track of what medications you are taking and when is it that you will need a refill. The day before your appointment: write a list of all prescriptions that need to be refilled. The day of the appointment: give the list to the admitting nurse. Prescriptions will be written only during appointments. No prescriptions will be written on procedure days. If you forget a medication: it will not be "Called in", "Faxed", or "electronically sent". You will need to get another appointment to get these prescribed. No early refills. Do not call asking to have your prescription filled early. Partial  or short prescriptions: Occasionally your pharmacy may not have enough pills to fill your prescription.  NEVER ACCEPT a partial fill or a prescription that is short of the total amount of pills that you were prescribed.  With controlled substances the law allows 72 hours for the pharmacy to complete the prescription.  If the prescription is not completed within 72 hours, the pharmacist will require a new prescription to be written. This means that you will be short on your medicine and we WILL NOT send another prescription to complete your original prescription.  Instead, request the pharmacy to send a carrier to a nearby branch to get enough medication to provide you with your full prescription. Prescription Accuracy: You are responsible for carefully inspecting your prescriptions before leaving our office. Have the discharge nurse carefully go over each prescription with you, before taking them home. Make sure that your name is accurately spelled, that your address is correct. Check the name and dose of your medication to make sure it is accurate. Check the number of pills, and the written instructions to make sure they are clear and accurate. Make sure that you are given enough medication  to last until your next medication refill appointment. Taking Medication: Take medication as prescribed. When it comes to controlled substances, taking less pills or less frequently than prescribed is permitted and encouraged. Never take more pills than instructed. Never take the medication more frequently than prescribed.  Inform other Doctors: Always inform, all of your healthcare providers, of all the medications you take. Pain Medication from other Providers: You are not allowed to accept any additional pain medication from any other Doctor or Healthcare provider. There are two exceptions to this rule. (see below) In the event that you require additional pain medication, you are responsible for notifying us, as stated below. Cough Medicine: Often these contain an opioid, such as codeine or hydrocodone. Never accept or take cough medicine containing these opioids if you are already taking an opioid* medication. The combination may cause respiratory failure and death. Medication Agreement: You are responsible for carefully reading and following our Medication Agreement. This must be signed before receiving any prescriptions from our practice. Safely store a copy of your signed Agreement. Violations to the Agreement will result in no further prescriptions. (Additional copies of our Medication Agreement are available upon request.) Laws, Rules, & Regulations: All patients are expected to follow  all Federal and [employer], Rules, & Regulations. Ignorance of the Laws does not constitute a valid excuse.  Illegal drugs and Controlled Substances: The use of illegal substances (including, but not limited to marijuana and its derivatives) and/or the illegal use of any controlled substances is strictly prohibited. Violation of this rule may result in the immediate and permanent discontinuation of any and all prescriptions being written by our practice. The use of any illegal substances is  prohibited. Adopted CDC guidelines & recommendations: Target dosing levels will be at or below 60 MME/day. Use of benzodiazepines** is not recommended.  Exceptions: There are only two exceptions to the rule of not receiving pain medications from other Healthcare Providers. Exception #1 (Emergencies): In the event of an emergency (i.e.: accident requiring emergency care), you are allowed to receive additional pain medication. However, you are responsible for: As soon as you are able, call our office [phone], at any time of the day or night, and leave a message stating your name, the date and nature of the emergency, and the name and dose of the medication prescribed. In the event that your call is answered by a member of our staff, make sure to document and save the date, time, and the name of the person that took your information.  Exception #2 (Planned Surgery): In the event that you are scheduled by another doctor or dentist to have any type of surgery or procedure, you are allowed (for a period no longer than 30 days), to receive additional pain medication, for the acute post-op pain. However, in this case, you are responsible for picking up a copy of our "Post-op Pain Management for Surgeons" handout, and giving it to your surgeon or dentist. This document is available at our office, and does not require an appointment to obtain it. Simply go to our office during business hours (Monday-Thursday from 8:00 AM to 4:00 PM) (Friday 8:00 AM to 12:00 Noon) or if you have a scheduled appointment with [place], prior to your surgery, and ask for it by name. In addition, you are responsible for: calling our office [phone], at any time of the day or night, and leaving a message stating your name, name of your surgeon, type of surgery, and date of procedure or surgery. Failure to comply with your responsibilities may result in termination of therapy involving the controlled substances. Medication Agreement  Violation. Following the above rules, including your responsibilities will help you in avoiding a Medication Agreement Violation ("Breaking your Pain Medication Contract").  Consequences:  Not following the above rules may result in permanent discontinuation of medication prescription therapy.  *Opioid medications include: morphine, codeine, oxycodone, oxymorphone, hydrocodone, hydromorphone, meperidine, tramadol, tapentadol, buprenorphine, fentanyl, methadone. **Benzodiazepine medications include: diazepam (Valium), alprazolam (Xanax), clonazepam (Klonopine), lorazepam (Ativan), clorazepate (Tranxene), chlordiazepoxide (Librium), estazolam (Prosom), oxazepam (Serax), temazepam (Restoril), triazolam (Halcion) (Last updated: 01/06/2022) ______________________________________________________________________    ______________________________________________________________________  Medication Recommendations and Reminders  Applies to: All patients receiving prescriptions (written and/or electronic).  Medication Rules & Regulations: You are responsible for reading, knowing, and following our "Medication Rules" document. These exist for your safety and that of others. They are not flexible and neither are we. Dismissing or ignoring them is an act of "non-compliance" that may result in complete and irreversible termination of such medication therapy. For safety reasons, "non-compliance" will not be tolerated. As with the [place] fundamental legal principle of "ignorance of the law is no defense", we will accept no excuses for not having read and  knowing the content of documents provided to you by our practice.  Pharmacy of record:  Definition: This is the pharmacy where your electronic prescriptions will be sent.  We do not endorse any particular pharmacy. It is up to you and your insurance to decide what pharmacy to use.  We do not restrict you in your choice of pharmacy. However, once we write for  your prescriptions, we will NOT be re-sending more prescriptions to fix restricted supply problems created by your pharmacy, or your insurance.  The pharmacy listed in the electronic medical record should be the one where you want electronic prescriptions to be sent. If you choose to change pharmacy, simply notify our nursing staff. Changes will be made only during your regular appointments and not over the phone.  Recommendations: Keep all of your pain medications in a safe place, under lock and key, even if you live alone. We will NOT replace lost, stolen, or damaged medication. We do not accept "Police Reports" as proof of medications having been stolen. After you fill your prescription, take 1 week's worth of pills and put them away in a safe place. You should keep a separate, properly labeled bottle for this purpose. The remainder should be kept in the original bottle. Use this as your primary supply, until it runs out. Once it's gone, then you know that you have 1 week's worth of medicine, and it is time to come in for a prescription refill. If you do this correctly, it is unlikely that you will ever run out of medicine. To make sure that the above recommendation works, it is very important that you make sure your medication refill appointments are scheduled at least 1 week before you run out of medicine. To do this in an effective manner, make sure that you do not leave the office without scheduling your next medication management appointment. Always ask the nursing staff to show you in your prescription , when your medication will be running out. Then arrange for the receptionist to get you a return appointment, at least 7 days before you run out of medicine. Do not wait until you have 1 or 2 pills left, to come in. This is very poor planning and does not take into consideration that we may need to cancel appointments due to bad weather, sickness, or emergencies affecting our staff. DO NOT ACCEPT A  "Partial Fill": If for any reason your pharmacy does not have enough pills/tablets to completely fill or refill your prescription, do not allow for a "partial fill". The law allows the pharmacy to complete that prescription within 72 hours, without requiring a new prescription. If they do not fill the rest of your prescription within those 72 hours, you will need a separate prescription to fill the remaining amount, which we will NOT provide. If the reason for the partial fill is your insurance, you will need to talk to the pharmacist about payment alternatives for the remaining tablets, but again, DO NOT ACCEPT A PARTIAL FILL, unless you can trust your pharmacist to obtain the remainder of the pills within 72 hours.  Prescription refills and/or changes in medication(s):  Prescription refills, and/or changes in dose or medication, will be conducted only during scheduled medication management appointments. (Applies to both, written and electronic prescriptions.) No refills on procedure days. No medication will be changed or started on procedure days. No changes, adjustments, and/or refills will be conducted on a procedure day. Doing so will interfere with the diagnostic portion of  the procedure. No phone refills. No medications will be "called into the pharmacy". No Fax refills. No weekend refills. No Holliday refills. No after hours refills.  Remember:  Business hours are:  Monday to Thursday 8:00 AM to 4:00 PM Provider's Schedule: Milinda Pointer, MD - Appointments are:  Medication management: Monday and Wednesday 8:00 AM to 4:00 PM Procedure day: Tuesday and Thursday 7:30 AM to 4:00 PM Gillis Santa, MD - Appointments are:  Medication management: Tuesday and Thursday 8:00 AM to 4:00 PM Procedure day: Monday and Wednesday 7:30 AM to 4:00 PM (Last update: 01/06/2022) ______________________________________________________________________     ____________________________________________________________________________________________  Drug Holidays  What is a "Drug Holiday"? Drug Holiday: is the name given to the process of slowly tapering down and temporarily stopping the pain medication for the purpose of decreasing or eliminating tolerance to the drug.  Benefits Improved effectiveness Decreased required effective dose Improved pain control End dependence on high dose therapy Decrease cost of therapy Uncovering "opioid-induced hyperalgesia". (OIH)  What is "opioid hyperalgesia"? It is a paradoxical increase in pain caused by exposure to opioids. Stopping the opioid pain medication, contrary to the expected, it actually decreases or completely eliminates the pain. Ref.: "A comprehensive review of opioid-induced hyperalgesia". Brion Aliment, et.al. Pain Physician. 2011 Mar-Apr;14(2):145-61.  What is tolerance? Tolerance: the progressive loss of effectiveness of a pain medicine due to repetitive use. A common problem of opioid pain medications.  How long should a "Drug Holiday" last? Effectiveness depends on the patient staying off all opioid pain medicines for a minimum of 14 consecutive days. (2 weeks)  How about just taking less of the medicine? Does not work. Will not accomplish goal of eliminating the excess receptors.  How about switching to a different pain medicine? (AKA. "Opioid rotation") Does not work. Creates the illusion of effectiveness by taking advantage of inaccurate equivalent dose calculations between different opioids. -This "technique" was promoted by studies funded by [employer], such as [employer], creators of "OxyContin".  Can I stop the medicine "cold [place]"? Depends. You should always coordinate with your Pain Specialist to make the transition as smoothly as possible. Avoid stopping the medicine abruptly without consulting. We recommend a "slow taper".  What is a slow  taper? Taper: refers to the gradual decrease in dose.   How do I stop/taper the dose? Slowly. Decrease the daily amount of pills that you take by one (1) pill every seven (7) days. This is called a "slow downward taper". Example: if you normally take four (4) pills per day, drop it to three (3) pills per day for seven (7) days, then to two (2) pills per day for seven (7) days, then to one (1) per day for seven (7) days, and then stop the medicine. The 14 day "Drug Holiday" starts on the first day without medicine.   Will I experience withdrawals? Unlikely with a slow taper.  What triggers withdrawals? Withdrawals are triggered by the sudden/abrupt stop of high dose opioids. Withdrawals can be avoided by slowly decreasing the dose over a prolonged period of time.  What are withdrawals? Symptoms associated with sudden/abrupt reduction/stopping of high-dose, long-term use of pain medication. Withdrawal are seldom seen on low dose therapy, or patients rarely taking opioid medication.  Early Withdrawal Symptoms may include: Agitation Anxiety Muscle aches Increased tearing Insomnia Runny nose Sweating Yawning  Late symptoms may include: Abdominal cramping Diarrhea Dilated pupils Goose bumps Nausea Vomiting  (Last update: 02/22/2022) ____________________________________________________________________________________________    ____________________________________________________________________________________________  WARNING: CBD (cannabidiol) &  Delta (Delta-8 tetrahydrocannabinol) products.   Applicable to:  All individuals currently taking or considering taking CBD (cannabidiol) and, more important, all patients taking opioid analgesic controlled substances (pain medication). (Example: oxycodone; oxymorphone; hydrocodone; hydromorphone; morphine; methadone; tramadol; tapentadol; fentanyl; buprenorphine; butorphanol; dextromethorphan; meperidine; codeine; etc.)  Introduction:   Recently there has been a drive towards the use of "natural" products for the treatment of different conditions, including pain anxiety and sleep disorders. Marijuana and hemp are two varieties of the cannabis genus plants. Marijuana and its derivatives are illegal, while hemp and its derivatives are not. Cannabidiol (CBD) and tetrahydrocannabinol (THC), are two natural compounds found in plants of the Cannabis genus. They can both be extracted from hemp or marijuana. Both compounds interact with your body's endocannabinoid system in very different ways. CBD is associated with pain relief (analgesia) while THC is associated with the psychoactive effects ("the high") obtained from the use of marijuana products. There are two main types of THC: Delta-9, which comes from the marijuana plant and it is illegal, and Delta-8, which comes from the hemp plant, and it is legal. (Both, Delta-9-THC and Delta-8-THC are psychoactive and give you "the high".)   Legality:  Marijuana and its derivatives: illegal Hemp and its derivatives: Legal (State dependent) UPDATE: (05/02/2021) The Drug Enforcement Agency ([place]) issued a letter stating that "delta" cannabinoids, including Delta-8-THCO and Delta-9-THCO, synthetically derived from hemp do not qualify as hemp and will be viewed as Schedule I drugs. (Schedule I drugs, substances, or chemicals are defined as drugs with no currently accepted medical use and a high potential for abuse. Some examples of Schedule I drugs are: heroin, lysergic acid diethylamide (LSD), marijuana (cannabis), 3,4-methylenedioxymethamphetamine (ecstasy), methaqualone, and peyote.) (https://jennings.com/)  Legal status of CBD in [place]:  "Conditionally Legal"  Reference: "FDA Regulation of Cannabis and Cannabis-Derived Products, Including Cannabidiol (CBD)" - SeekArtists.com.pt  Warning:   CBD is not FDA approved and has not undergo the same manufacturing controls as prescription drugs.  This means that the purity and safety of available CBD may be questionable. Most of the time, despite manufacturer's claims, it is contaminated with THC (delta-9-tetrahydrocannabinol - the chemical in marijuana responsible for the "HIGH").  When this is the case, the [facility] contaminant will trigger a positive urine drug screen (UDS) test for Marijuana (carboxy-THC).   The FDA recently put out a warning about 5 things that everyone should be aware of regarding Delta-8 THC: Delta-8 THC products have not been evaluated or approved by the FDA for safe use and may be marketed in ways that put the public health at risk. The FDA has received adverse event reports involving delta-8 THC-containing products. Delta-8 THC has psychoactive and intoxicating effects. Delta-8 THC manufacturing often involve use of potentially harmful chemicals to create the concentrations of delta-8 THC claimed in the marketplace. The final delta-8 THC product may have potentially harmful by-products (contaminants) due to the chemicals used in the process. Manufacturing of delta-8 THC products may occur in uncontrolled or unsanitary settings, which may lead to the presence of unsafe contaminants or other potentially harmful substances. Delta-8 THC products should be kept out of the reach of children and pets.  NOTE: Because a positive UDS for any illicit substance is a violation of our medication agreement, your opioid analgesics (pain medicine) may be permanently discontinued.  MORE ABOUT CBD  General Information: CBD was discovered in [birth year] and it is a derivative of the cannabis sativa genus plants (Marijuana and Hemp). It is one of the 113 identified  substances found in Marijuana. It accounts for up to 40% of the plant's extract. As of 2018, preliminary clinical studies on CBD included research for the treatment of anxiety, movement  disorders, and pain. CBD is available and consumed in multiple forms, including inhalation of smoke or vapor, as an aerosol spray, and by mouth. It may be supplied as an oil containing CBD, capsules, dried cannabis, or as a liquid solution. CBD is thought not to be as psychoactive as THC (delta-9-tetrahydrocannabinol - the chemical in marijuana responsible for the "HIGH"). Studies suggest that CBD may interact with different biological target receptors in the body, including cannabinoid and other neurotransmitter receptors. As of 2018 the mechanism of action for its biological effects has not been determined.  Side-effects  Adverse reactions: Dry mouth, diarrhea, decreased appetite, fatigue, drowsiness, malaise, weakness, sleep disturbances, and others.  Drug interactions:  CBD may interact with medications such as blood-thinners. CBD causes drowsiness on its own and it will increase drowsiness caused by other medications, including antihistamines (such as Benadryl), benzodiazepines (Xanax, Ativan, Valium), antipsychotics, antidepressants, opioids, alcohol and supplements such as kava, melatonin and St. John's Wort.  Other drug interactions: Brivaracetam (Briviact); Caffeine; Carbamazepine (Tegretol); Citalopram (Celexa); Clobazam (Onfi); Eslicarbazepine (Aptiom); Everolimus (Zostress); Lithium; Methadone (Dolophine); Rufinamide (Banzel); Sedative medications (CNS depressants); Sirolimus (Rapamune); Stiripentol (Diacomit); Tacrolimus (Prograf); Tamoxifen ; Soltamox); Topiramate (Topamax); Valproate; Warfarin (Coumadin); Zonisamide. (Last update: 02/23/2022) ____________________________________________________________________________________________   ____________________________________________________________________________________________  Naloxone Nasal Spray  Why am I receiving this medication? [place] STOP ACT requires that all patients taking high dose opioids or at risk of opioids  respiratory depression, be prescribed an opioid reversal agent, such as Naloxone (AKA: Narcan).  What is this medication? NALOXONE (nal OX one) treats opioid overdose, which causes slow or shallow breathing, severe drowsiness, or trouble staying awake. Call emergency services after using this medication. You may need additional treatment. Naloxone works by reversing the effects of opioids. It belongs to a group of medications called opioid blockers.  COMMON BRAND NAME(S): Kloxxado, Narcan  What should I tell my care team before I take this medication? They need to know if you have any of these conditions: Heart disease Substance use disorder An unusual or allergic reaction to naloxone, other medications, foods, dyes, or preservatives Pregnant or trying to get pregnant Breast-feeding  When to use this medication? This medication is to be used for the treatment of respiratory depression (less than 8 breaths per minute) secondary to opioid overdose.   How to use this medication? This medication is for use in the nose. Lay the person on their back. Support their neck with your hand and allow the head to tilt back before giving the medication. The nasal spray should be given into 1 nostril. After giving the medication, move the person onto their side. Do not remove or test the nasal spray until ready to use. Get emergency medical help right away after giving the first dose of this medication, even if the person wakes up. You should be familiar with how to recognize the signs and symptoms of a narcotic overdose. If more doses are needed, give the additional dose in the other nostril. Talk to your care team about the use of this medication in children. While this medication may be prescribed for children as young as newborns for selected conditions, precautions do apply.  Naloxone Overdosage: If you think you have taken too much of this medicine contact a poison control center or emergency room at  once.  NOTE: This medicine  is only for you. Do not share this medicine with others.  What if I miss a dose? This does not apply.  What may interact with this medication? This is only used during an emergency. No interactions are expected during emergency use. This list may not describe all possible interactions. Give your health care provider a list of all the medicines, herbs, non-prescription drugs, or dietary supplements you use. Also tell them if you smoke, drink alcohol, or use illegal drugs. Some items may interact with your medicine.  What should I watch for while using this medication? Keep this medication ready for use in the case of an opioid overdose. Make sure that you have the phone number of your care team and local hospital ready. You may need to have additional doses of this medication. Each nasal spray contains a single dose. Some emergencies may require additional doses. After use, bring the treated person to the nearest hospital or call 911. Make sure the treating care team knows that the person has received a dose of this medication. You will receive additional instructions on what to do during and after use of this medication before an emergency occurs.  What side effects may I notice from receiving this medication? Side effects that you should report to your care team as soon as possible: Allergic reactions--skin rash, itching, hives, swelling of the face, lips, tongue, or throat Side effects that usually do not require medical attention (report these to your care team if they continue or are bothersome): Constipation Dryness or irritation inside the nose Headache Increase in blood pressure Muscle spasms Stuffy nose Toothache This list may not describe all possible side effects. Call your doctor for medical advice about side effects. You may report side effects to FDA at 1-800-FDA-1088.  Where should I keep my medication? Because this is an emergency medication, you  should keep it with you at all times.  Keep out of the reach of children and pets. Store between 20 and 25 degrees C (68 and 77 degrees F). Do not [place]. Throw away any unused medication after the expiration date. Keep in original box until ready to use.  NOTE: This sheet is a summary. It may not cover all possible information. If you have questions about this medicine, talk to your doctor, pharmacist, or health care provider.   2023 Elsevier/Gold Standard (2020-11-08 00:00:00)  ____________________________________________________________________________________________   ____________________________________________________________________________________________  Patient Information update  To: All of our patients.  Re: Name change.  It has been made official that our current name, "[facility]"   will soon be changed to "[facility]".   The purpose of this change is to eliminate any confusion created by the concept of our practice being a "Medication Management Pain Clinic". In the past this has led to the misconception that we treat pain primarily by the use of prescription medications.  Nothing can be farther from the truth.   Understanding PAIN MANAGEMENT: To further understand what our practice does, you first have to understand that "Pain Management" is a subspecialty that requires additional training once a physician has completed their specialty training, which can be in either Anesthesia, Neurology, Psychiatry, or Physical Medicine and Rehabilitation (PMR). Each one of these contributes to the final approach taken by each physician to the management of their patient's pain. To be a "Pain Management Specialist" you must have first completed one of the specialty trainings below.  Anesthesiologists -  trained in clinical pharmacology and interventional techniques such as nerve  blockade and regional as well as central neuroanatomy. They are trained to block pain before, during, and after surgical interventions.  Neurologists - trained in the diagnosis and pharmacological treatment of complex neurological conditions, such as Multiple Sclerosis, Parkinson's, spinal cord injuries, and other systemic conditions that may be associated with symptoms that may include but are not limited to pain. They tend to rely primarily on the treatment of chronic pain using prescription medications.  Psychiatrist - trained in conditions affecting the psychosocial wellbeing of patients including but not limited to depression, anxiety, schizophrenia, personality disorders, addiction, and other substance use disorders that may be associated with chronic pain. They tend to rely primarily on the treatment of chronic pain using prescription medications.   Physical Medicine and Rehabilitation (PMR) physicians, also known as physiatrists - trained to treat a wide variety of medical conditions affecting the brain, spinal cord, nerves, bones, joints, ligaments, muscles, and tendons. Their training is primarily aimed at treating patients that have suffered injuries that have caused severe physical impairment. Their training is primarily aimed at the physical therapy and rehabilitation of those patients. They may also work alongside orthopedic surgeons or neurosurgeons using their expertise in assisting surgical patients to recover after their surgeries.  INTERVENTIONAL PAIN MANAGEMENT is sub-subspecialty of Pain Management.  Our physicians are Board-certified in Anesthesia, Pain Management, and Interventional Pain Management.  This meaning that not only have they been trained and Board-certified in their specialty of Anesthesia, and subspecialty of Pain Management, but they have also received further training in the sub-subspecialty of Interventional Pain Management, in order to become Board-certified as  INTERVENTIONAL PAIN MANAGEMENT SPECIALIST.    Mission: Our goal is to use our skills in  [place] as alternatives to the chronic use of prescription opioid medications for the treatment of pain. To make this more clear, we have changed our name to reflect what we do and offer. We will continue to offer medication management assessment and recommendations, but we will not be taking over any patient's medication management.  ____________________________________________________________________________________________

## 2022-04-19 NOTE — Progress Notes (Unsigned)
PROVIDER NOTE: Information contained herein reflects review and annotations entered in association with encounter. Interpretation of such information and data should be left to medically-trained personnel. Information provided to patient can be located elsewhere in the medical record under "Patient Instructions". Document created using STT-dictation technology, any transcriptional errors that may result from process are unintentional.    Patient: Darren Allen  Service Category: E/M  Provider: Gaspar Cola, MD  DOB: 1965/07/12  DOS: 04/20/2022  Referring Provider: Jon Billings, NP  MRN: [id number]  Specialty: Interventional Pain Management  PCP: Jon Billings, NP  Type: Established Patient  Setting: Ambulatory outpatient    Location: Office  Delivery: Face-to-face     HPI  Darren Allen, a 57 y.o. year old male, is here today because of his No primary diagnosis found.. Darren Allen primary complain today is No chief complaint on file. Last encounter: My last encounter with him was on 04/02/2022. Pertinent problems: Darren Allen has Ankle fracture; Neuropathy; Diabetic peripheral neuropathy ([place]); Gout; Chronic ankle pain (Bilateral); Chronic low back pain (1ry area of Pain) (Bilateral) (R>L) w/o sciatica; Other acquired hammer toe; Chronic pain syndrome; Lumbar facet syndrome; Lumbosacral radiculopathy at S1 (Left); Chronic feet pain (2ry area of Pain) (Bilateral); Chronic ankle pain (Left); Abnormal NCS (nerve conduction studies) (02/20/2020); Abnormal MRI, lumbar spine (03/11/2020); DDD (degenerative disc disease), lumbosacral; Lumbosacral lateral recess stenosis (Left: L5-S1); and Chronic low back pain (Bilateral) w/ sciatica (Left) on their pertinent problem list. Pain Assessment: Severity of   is reported as a  /10. Location:    / . Onset:  . Quality:  . Timing:  . Modifying factor(s):  Marland Kitchen Vitals:  vitals were not taken for this visit.  BMI: Estimated body mass index is 29.66  kg/m as calculated from the following:   Height as of 04/02/22: '6\' 2"'$  (1.88 m).   Weight as of 04/02/22: 231 lb (104.8 kg).  Reason for encounter: medication management. ***  R 07/26/2022   Post-procedure evaluation   Procedure: Caudal Epidural Steroid Injection   #1  Laterality: Midline aiming left Level: Sacrococcygeal ligament  Imaging: Fluoroscopy-guided         Anesthesia: Local anesthesia (1-2% Lidocaine) Anxiolysis: None                 Sedation: No Sedation                       DOS: 04/02/2022  Performed by: Gaspar Cola, MD  Purpose: Diagnostic/Therapeutic Indications: Low back and lower extremity pain severe enough to impact quality of life or function. Rationale (medical necessity): procedure needed and proper for the diagnosis and/or treatment of Darren Allen medical symptoms and needs. 1. Lumbosacral lateral recess stenosis (Left: L5-S1)   2. Lumbosacral radiculopathy at S1 (Left)   3. Diabetic peripheral neuropathy ([place])   4. DDD (degenerative disc disease), lumbosacral   5. 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 Abnormal MRI, lumbar spine (03/11/2020)    NAS-11 Pain score:   Pre-procedure: 3 /10   Post-procedure: 3 /10      Effectiveness:  Initial hour after procedure:   ***. Subsequent 4-6 hours post-procedure:   ***. Analgesia past initial 6 hours:   ***. Ongoing improvement:  Analgesic:  *** Function:    ***    ROM:    ***     Pharmacotherapy Assessment  Analgesic: Hydrocodone/APAP 5/325 tablet, 1 tab p.o. twice daily (#60) MME/day: 10 mg/day  Monitoring: [place] PMP: PDMP reviewed during this encounter.       Pharmacotherapy: No side-effects or adverse reactions reported. Compliance: No problems identified. Effectiveness: Clinically acceptable.  No notes on file  No results found for: "CBDTHCR" No results found for: "D8THCCBX" No results found for: "D9THCCBX"  UDS:  Summary  Date Value Ref Range Status  09/08/2021  Note  Final    Comment:    ==================================================================== Compliance Drug Analysis, Ur ==================================================================== Test                             Result       Flag       Units  Drug Present and Declared for Prescription Verification   Hydrocodone                    697          EXPECTED   ng/mg creat   Dihydrocodeine                 36           EXPECTED   ng/mg creat   Norhydrocodone                 1265         EXPECTED   ng/mg creat    Sources of hydrocodone include scheduled prescription medications.    Dihydrocodeine and norhydrocodone are expected metabolites of    hydrocodone. Dihydrocodeine is also available as a scheduled    prescription medication.    Gabapentin                     PRESENT      EXPECTED   Amitriptyline                  PRESENT      EXPECTED   Nortriptyline                  PRESENT      EXPECTED    Nortriptyline is an expected metabolite of amitriptyline.    Acetaminophen                  PRESENT      EXPECTED  Drug Absent but Declared for Prescription Verification   Metoprolol                     Not Detected UNEXPECTED ==================================================================== Test                      Result    Flag   Units      Ref Range   Creatinine              284              mg/dL      >=20 ==================================================================== Declared Medications:  The flagging and interpretation on this report are based on the  following declared medications.  Unexpected results may arise from  inaccuracies in the declared medications.   **Note: The testing scope of this panel includes these medications:   Amitriptyline (Elavil)  Gabapentin (Neurontin)  Hydrocodone (Norco)  Metoprolol   **Note: The testing scope of this panel does not include small to  moderate amounts of these reported medications:   Acetaminophen (Tylenol)   Acetaminophen (Norco)   **Note: The testing scope of this panel does not include the  following reported medications:   Albuterol (Proair HFA)  Fluticasone (Flonase)  Fluticasone (Trelegy)  Hydrochlorothiazide  Insulin (Humulin)  Iron  Levalbuterol (Xopenex)  Lisinopril (Zestril)  Metformin  Montelukast  Pantoprazole (Protonix)  Rosuvastatin (Crestor)  Testosterone  Triamcinolone (Kenalog)  Umeclidinium (Trelegy)  Vilanterol (Trelegy)  Vitamin C ==================================================================== For clinical consultation, please call [phone]. ====================================================================       ROS  Constitutional: Denies any fever or chills Gastrointestinal: No reported hemesis, hematochezia, vomiting, or acute GI distress Musculoskeletal: Denies any acute onset joint swelling, redness, loss of ROM, or weakness Neurological: No reported episodes of acute onset apraxia, aphasia, dysarthria, agnosia, amnesia, paralysis, loss of coordination, or loss of consciousness  Medication Review  Dulaglutide, Fluticasone-Umeclidin-Vilant, HYDROcodone-acetaminophen, acetaminophen, albuterol, amitriptyline, ascorbic acid, ferrous sulfate, fluticasone, gabapentin, hydrochlorothiazide, insulin isophane & regular human KwikPen, metFORMIN, montelukast, naloxone, ondansetron, pantoprazole, rosuvastatin, tadalafil, testosterone cypionate, and triamcinolone cream  History Review  Allergy: Darren Allen is allergic to glipizide, cephalexin, and duloxetine. Drug: Darren Allen  reports no history of drug use. Alcohol:  reports that he does not currently use alcohol after a past usage of about 12.0 standard drinks of alcohol per week. Tobacco:  reports that he has been smoking cigarettes. He has a 34.00 pack-year smoking history. He has never used smokeless tobacco. Social: Darren Allen  reports that he has been smoking cigarettes. He has a 34.00 pack-year  smoking history. He has never used smokeless tobacco. He reports that he does not currently use alcohol after a past usage of about 12.0 standard drinks of alcohol per week. He reports that he does not use drugs. Medical:  has a past medical history of Allergy, Calculus of kidney (02/04/2015), COPD (chronic obstructive pulmonary disease) ([place]), Diabetes mellitus without complication ([place]), Hypertension, and Neuropathy. Surgical: Darren Allen  has a past surgical history that includes Appendectomy; Incision and drainage perirectal abscess (N/A, 02/04/2015); Rectal exam under anesthesia (02/04/2015); ORIF ankle fracture (Left, 03/23/2017); Syndesmosis repair (Left, 03/23/2017); Colonoscopy with propofol (N/A, 12/31/2021); Esophagogastroduodenoscopy (N/A, 12/31/2021); Kidney stone surgery (Right); lung mass removal (N/A); Xi robotic assisted paraesophageal hernia repair (N/A, 01/20/2022); Insertion of mesh (19/08/2227); and Umbilical hernia repair (01/20/2022). Family: family history includes Alcohol abuse in his father; Cancer in his father; Cancer (age of onset: 51) in his brother; Cancer (age of onset: 51) in his mother; Diabetes in his brother; Heart disease in his brother and father.  Laboratory Chemistry Profile   Renal Lab Results  Component Value Date   BUN 14 02/20/2022   CREATININE 1.13 02/20/2022   BCR 12 02/20/2022   GFRAA 59 (L) 12/07/2019   GFRNONAA >60 02/15/2022    Hepatic Lab Results  Component Value Date   AST 20 02/20/2022   ALT 19 02/20/2022   ALBUMIN 4.4 02/20/2022   ALKPHOS 86 02/20/2022   AMYLASE 93 02/04/2022   LIPASE 115 (H) 02/04/2022    Electrolytes Lab Results  Component Value Date   NA 139 02/20/2022   K 3.7 02/20/2022   CL 100 02/20/2022   CALCIUM 9.3 02/20/2022   MG 1.8 01/25/2022   PHOS 2.3 (L) 01/23/2022    Bone Lab Results  Component Value Date   25OHVITD1 44 09/08/2021   25OHVITD2 <1.0 09/08/2021   25OHVITD3 43 09/08/2021   TESTOSTERONE 559  03/23/2022    Inflammation (CRP: Acute Phase) (ESR: Chronic Phase) Lab Results  Component Value Date   CRP 1.8 (H) 09/08/2021   ESRSEDRATE 17 09/08/2021   LATICACIDVEN 1.6 02/03/2022  Note: Above Lab results reviewed.  Recent Imaging Review  DG PAIN CLINIC C-ARM 1-60 MIN NO REPORT Fluoro was used, but no Radiologist interpretation will be provided.  Please refer to "NOTES" tab for provider progress note. Note: Reviewed        Physical Exam  General appearance: Well nourished, well developed, and well hydrated. In no apparent acute distress Mental status: Alert, oriented x 3 (person, place, & time)       Respiratory: No evidence of acute respiratory distress Eyes: PERLA Vitals: There were no vitals taken for this visit. BMI: Estimated body mass index is 29.66 kg/m as calculated from the following:   Height as of 04/02/22: '6\' 2"'$  (1.88 m).   Weight as of 04/02/22: 231 lb (104.8 kg). Ideal: Patient weight not recorded  Assessment   Diagnosis Status  1. Chronic pain syndrome   2. Pharmacologic therapy   3. DDD (degenerative disc disease), lumbosacral   4. Encounter for medication management   5. Encounter for chronic pain management   6. Chronic use of opiate for therapeutic purpose   7. Lumbar facet syndrome   8. Chronic low back pain (1ry area of Pain) (Bilateral) (R>L) w/o sciatica    Controlled Controlled Controlled   Updated Problems: No problems updated.  Plan of Care  Problem-specific:  No problem-specific Assessment & Plan notes found for this encounter.  Darren Allen has a current medication list which includes the following long-term medication(s): ferrous sulfate, fluticasone, hydrochlorothiazide, hydrocodone-acetaminophen, humulin 70/30 kwikpen, metformin, montelukast, pantoprazole, rosuvastatin, tadalafil, and testosterone cypionate.  Pharmacotherapy (Medications Ordered): No orders of the defined types were placed in this  encounter.  Orders:  No orders of the defined types were placed in this encounter.  Follow-up plan:   No follow-ups on file.     Interventional Therapies  Risk  Complexity Considerations:   Estimated body mass index is 33.91 kg/m as calculated from the following:   Height as of this encounter: 6' (1.829 m).   Weight as of this encounter: 250 lb (113.4 kg). WNL   Planned  Pending:      Under consideration:   Diagnostic bilateral lumbar facet MBB #1  Diagnostic/therapeutic left L5 & S1 TFESI #1  Possible spinal cord stimulator trial  Therapeutic left L5-S1 percutaneous discectomy with "[employer] Dekompressor" system    Completed:   Diagnostic left L5-S1 LESI x1 (12/16/2021) (100/100/100/LBP:85  LEP:100)  Therapeutic bilateral Qutenza neurolytic treatment x1 (12/23/2021)  (09/08/2021 & 10/29/2021) referral to physical therapy for evaluation and treatment of low back pain.   Completed by other providers:   EMG/PNCV of lower extremity (02/20/2020) by Dr. Jennings Books (generalized sensorimotor peripheral neuropathy; superimposed left S1 radiculopathy)   Therapeutic  Palliative (PRN) options:   None established      Recent Visits Date Type Provider Dept  04/02/22 Procedure visit Milinda Pointer, MD [facility]  Showing recent visits within past 90 days and meeting all other requirements Future Appointments Date Type Provider Dept  04/20/22 Appointment Milinda Pointer, MD [facility]  Showing future appointments within next 90 days and meeting all other requirements  I discussed the assessment and treatment plan with the patient. The patient was provided an opportunity to ask questions and all were answered. The patient agreed with the plan and demonstrated an understanding of the instructions.  Patient advised to call back or seek an in-person evaluation if the symptoms or condition worsens.  Duration of encounter: *** minutes.  Total time on  encounter, as per AMA guidelines included both the face-to-face and non-face-to-face time personally spent by the physician and/or other qualified health care professional(s) on the day of the encounter (includes time in activities that require the physician or other qualified health care professional and does not include time in activities normally performed by clinical staff). Physician's time may include the following activities when performed: Preparing to see the patient (e.g., pre-charting review of records, searching for previously ordered imaging, lab work, and nerve conduction tests) Review of prior analgesic pharmacotherapies. Reviewing PMP Interpreting ordered tests (e.g., lab work, imaging, nerve conduction tests) Performing post-procedure evaluations, including interpretation of diagnostic procedures Obtaining and/or reviewing separately obtained history Performing a medically appropriate examination and/or evaluation Counseling and educating the patient/family/caregiver Ordering medications, tests, or procedures Referring and communicating with other health care professionals (when not separately reported) Documenting clinical information in the electronic or other health record Independently interpreting results (not separately reported) and communicating results to the patient/ family/caregiver Care coordination (not separately reported)  Note by: Gaspar Cola, MD Date: 04/20/2022; Time: 1:09 PM

## 2022-04-20 ENCOUNTER — Encounter: Payer: Self-pay | Admitting: Pain Medicine

## 2022-04-20 ENCOUNTER — Ambulatory Visit: Payer: 59 | Attending: Pain Medicine | Admitting: Pain Medicine

## 2022-04-20 DIAGNOSIS — M47816 Spondylosis without myelopathy or radiculopathy, lumbar region: Secondary | ICD-10-CM | POA: Insufficient documentation

## 2022-04-20 DIAGNOSIS — M5137 Other intervertebral disc degeneration, lumbosacral region: Secondary | ICD-10-CM | POA: Diagnosis not present

## 2022-04-20 DIAGNOSIS — M545 Low back pain, unspecified: Secondary | ICD-10-CM | POA: Diagnosis present

## 2022-04-20 DIAGNOSIS — Z79899 Other long term (current) drug therapy: Secondary | ICD-10-CM | POA: Insufficient documentation

## 2022-04-20 DIAGNOSIS — G894 Chronic pain syndrome: Secondary | ICD-10-CM | POA: Diagnosis present

## 2022-04-20 DIAGNOSIS — Z79891 Long term (current) use of opiate analgesic: Secondary | ICD-10-CM | POA: Diagnosis present

## 2022-04-20 DIAGNOSIS — G8929 Other chronic pain: Secondary | ICD-10-CM | POA: Insufficient documentation

## 2022-04-20 MED ORDER — HYDROCODONE-ACETAMINOPHEN 5-325 MG PO TABS: 1.0000 | ORAL_TABLET | Freq: Two times a day (BID) | ORAL | 0 refills | Status: DC | PRN

## 2022-04-20 NOTE — Progress Notes (Signed)
Nursing Pain Medication Assessment:  Safety precautions to be maintained throughout the outpatient stay will include: orient to surroundings, keep bed in low position, maintain call bell within reach at all times, provide assistance with transfer out of bed and ambulation.  Medication Inspection Compliance: Pill count conducted under aseptic conditions, in front of the patient. Neither the pills nor the bottle was removed from the patient's sight at any time. Once count was completed pills were immediately returned to the patient in their original bottle.  Medication: Hydrocodone/APAP Pill/Patch Count:  30 of 60 pills remain Pill/Patch Appearance: Markings consistent with prescribed medication Bottle Appearance: Standard pharmacy container. Clearly labeled. Filled Date: 01 / 13 / 2024 Last Medication intake:  Today

## 2022-04-24 ENCOUNTER — Ambulatory Visit: Payer: Self-pay

## 2022-04-24 NOTE — Telephone Encounter (Signed)
  Chief Complaint: HTN Symptoms: BP 157/97 today had dizziness so took BP  Frequency: several days now Pertinent Negatives: Patient denies chest pain or HA Disposition: '[]'$ ED /'[]'$ Urgent Care (no appt availability in office) / '[x]'$ Appointment(In office/virtual)/ '[]'$  [facility]/ '[]'$ Home Care/ '[]'$ Refused Recommended Disposition /'[]'$ [facility]/ '[]'$  Follow-up with PCP Additional Notes: reviewed pt's chart and pt was on lisinopril '20mg'$ . Pt asked if safe to take 1/2 tablet until FU on 04/29/22 with PCP. Advised pt if he wants to try 1/2 tablet of '20mg'$  just to monitor BP and make sure to keep appt on 04/29/22. Pt verbalized understanding.   Summary: Blood pressure Med questions   Pt wants to speak to a nurse regarding his medication. He had surgery November 6 and they took him off of lisinopril but now his blood pressure is running high and he wants to know if he should resume his lisinopril  Best contact: [phone]       Reason for Disposition  [0] Systolic BP  >= 175 OR Diastolic >= 80 AND [1] taking BP medications  Answer Assessment - Initial Assessment Questions 1. BLOOD PRESSURE: "What is the blood pressure?" "Did you take at least two measurements 5 minutes apart?"     157/97 2. ONSET: "When did you take your blood pressure?"     Checked it today  3. HOW: "How did you take your blood pressure?" (e.g., automatic home BP monitor, visiting nurse)     Home  4. HISTORY: "Do you have a history of high blood pressure?"     Yes,  5. MEDICINES: "Are you taking any medicines for blood pressure?" "Have you missed any doses recently?"     Been off lisinopril since Nov.  6. OTHER SYMPTOMS: "Do you have any symptoms?" (e.g., blurred vision, chest pain, difficulty breathing, headache, weakness)     dizziness  Protocols used: Blood Pressure - High-A-AH

## 2022-04-29 ENCOUNTER — Telehealth (INDEPENDENT_AMBULATORY_CARE_PROVIDER_SITE_OTHER): Payer: 59 | Admitting: Nurse Practitioner

## 2022-04-29 ENCOUNTER — Encounter: Payer: Self-pay | Admitting: Nurse Practitioner

## 2022-04-29 VITALS — BP 146/85 | HR 90

## 2022-04-29 DIAGNOSIS — E1159 Type 2 diabetes mellitus with other circulatory complications: Secondary | ICD-10-CM | POA: Diagnosis not present

## 2022-04-29 DIAGNOSIS — I152 Hypertension secondary to endocrine disorders: Secondary | ICD-10-CM

## 2022-04-29 DIAGNOSIS — D509 Iron deficiency anemia, unspecified: Secondary | ICD-10-CM

## 2022-04-29 MED ORDER — LISINOPRIL 20 MG PO TABS: 20.0000 mg | ORAL_TABLET | Freq: Every day | ORAL | 1 refills | Status: DC

## 2022-04-29 NOTE — Assessment & Plan Note (Signed)
Chronic.  Has increased back to 140/80.  Will add back Lisinopril to HCTZ.  Continue to check blood pressures at home.  Will need to check CMP at next visit.  Follow up in 1 month.  Call sooner if concerns arise.

## 2022-04-29 NOTE — Progress Notes (Signed)
BP (!) 146/85   Pulse 90    Subjective:    Patient ID: Darren Allen, male    DOB: 08/18/1965, 57 y.o.   MRN: [id number]  HPI: Darren Allen is a 57 y.o. male  Chief Complaint  Patient presents with   Diabetes   Hyperlipidemia   Hypertension   HYPERTENSION without Chronic Kidney Disease Hypertension status: uncontrolled  Satisfied with current treatment? yes Duration of hypertension: years BP monitoring frequency:  daily BP range:  BP medication side effects:  no Medication compliance: excellent compliance Previous BP meds:HCTZ and lisinopril Aspirin: no Recurrent headaches: no Visual changes: no Palpitations: no Dyspnea: no Chest pain: no Lower extremity edema: no Dizzy/lightheaded: no  Relevant past medical, surgical, family and social history reviewed and updated as indicated. Interim medical history since our last visit reviewed. Allergies and medications reviewed and updated.  Review of Systems  Eyes:  Negative for visual disturbance.  Respiratory:  Negative for shortness of breath.   Cardiovascular:  Negative for chest pain and leg swelling.  Neurological:  Negative for light-headedness and headaches.    Per HPI unless specifically indicated above     Objective:    BP (!) 146/85   Pulse 90   Wt Readings from Last 3 Encounters:  04/20/22 229 lb (103.9 kg)  04/02/22 231 lb (104.8 kg)  03/02/22 231 lb (104.8 kg)    Physical Exam Vitals and nursing note reviewed.  Constitutional:      General: He is not in acute distress.    Appearance: He is not ill-appearing.  HENT:     Head: Normocephalic.     Right Ear: Hearing normal.     Left Ear: Hearing normal.     Nose: Nose normal.  Pulmonary:     Effort: Pulmonary effort is normal. No respiratory distress.  Neurological:     Mental Status: He is alert.  Psychiatric:        Mood and Affect: Mood normal.        Behavior: Behavior normal.        Thought Content: Thought content normal.         Judgment: Judgment normal.     Results for orders placed or performed in visit on 03/23/22  Testosterone  Result Value Ref Range   Testosterone 559 264 - 916 ng/dL      Assessment & Plan:   Problem List Items Addressed This Visit       Cardiovascular and Mediastinum   Hypertension associated with diabetes ([place]) - Primary    Chronic.  Has increased back to 140/80.  Will add back Lisinopril to HCTZ.  Continue to check blood pressures at home.  Will need to check CMP at next visit.  Follow up in 1 month.  Call sooner if concerns arise.        Relevant Medications   lisinopril (ZESTRIL) 20 MG tablet     Other   Iron deficiency anemia    Will check iron studies due to restless leg symptoms.  Will make recommendations based on lab results.       Relevant Orders   Iron, TIBC and Ferritin Panel     Follow up plan: Return in about 1 month (around 05/28/2022) for HTN, HLD, DM2 FU.  This visit was completed via MyChart due to the restrictions of the COVID-19 pandemic. All issues as above were discussed and addressed. Physical exam was done as above through visual confirmation on MyChart. If it was felt  that the patient should be evaluated in the office, they were directed there. The patient verbally consented to this visit. Location of the patient: Home Location of the provider: Office Those involved with this call:  Provider: Jon Billings, NP CMA: Yvonna Alanis, CMA Front Desk/Registration: Lynnell Catalan This encounter was conducted via video.  I spent 30 dedicated to the care of this patient on the date of this encounter to include previsit review of symptoms, plan of care and follow up, face to face time with the patient, and post visit ordering of testing.

## 2022-04-29 NOTE — Assessment & Plan Note (Signed)
Will check iron studies due to restless leg symptoms.  Will make recommendations based on lab results.

## 2022-05-01 ENCOUNTER — Other Ambulatory Visit: Payer: 59

## 2022-05-01 DIAGNOSIS — E1140 Type 2 diabetes mellitus with diabetic neuropathy, unspecified: Secondary | ICD-10-CM

## 2022-05-01 DIAGNOSIS — D509 Iron deficiency anemia, unspecified: Secondary | ICD-10-CM

## 2022-05-02 LAB — IRON,TIBC AND FERRITIN PANEL
Ferritin: 10 ng/mL — ABNORMAL LOW (ref 30–400)
Iron Saturation: 46 % (ref 15–55)
Iron: 213 ug/dL — ABNORMAL HIGH (ref 38–169)
Total Iron Binding Capacity: 460 ug/dL — ABNORMAL HIGH (ref 250–450)
UIBC: 247 ug/dL (ref 111–343)

## 2022-05-04 NOTE — Progress Notes (Signed)
Good Morning Darren Allen. Your iron studies look good.  Try taking the magnesium over the next couple of weeks and lets see if your symptoms improve.

## 2022-05-05 LAB — MICROALBUMIN / CREATININE URINE RATIO
Creatinine, Urine: 224 mg/dL
Microalb/Creat Ratio: 40 mg/g creat — ABNORMAL HIGH (ref 0–29)
Microalbumin, Urine: 88.7 ug/mL

## 2022-05-05 LAB — SPECIMEN STATUS REPORT

## 2022-05-06 DIAGNOSIS — I2510 Atherosclerotic heart disease of native coronary artery without angina pectoris: Secondary | ICD-10-CM | POA: Insufficient documentation

## 2022-05-11 ENCOUNTER — Telehealth: Payer: Self-pay | Admitting: Nurse Practitioner

## 2022-05-11 ENCOUNTER — Encounter: Payer: Self-pay | Admitting: Nurse Practitioner

## 2022-05-11 NOTE — Telephone Encounter (Signed)
Contacted Darren Allen to schedule their annual wellness visit. Appointment made for 06/01/2022.  Sherol Dade; Care Guide Ambulatory Clinical [facility] Direct Dial: [phone]

## 2022-05-13 ENCOUNTER — Encounter: Payer: Self-pay | Admitting: Pain Medicine

## 2022-05-18 ENCOUNTER — Ambulatory Visit: Payer: 59 | Attending: Pain Medicine | Admitting: Pain Medicine

## 2022-05-18 ENCOUNTER — Encounter: Payer: Self-pay | Admitting: Pain Medicine

## 2022-05-18 VITALS — BP 103/76 | HR 112 | Temp 97.6°F | Ht 74.0 in | Wt 229.0 lb

## 2022-05-18 DIAGNOSIS — M47816 Spondylosis without myelopathy or radiculopathy, lumbar region: Secondary | ICD-10-CM | POA: Insufficient documentation

## 2022-05-18 DIAGNOSIS — M5137 Other intervertebral disc degeneration, lumbosacral region: Secondary | ICD-10-CM | POA: Insufficient documentation

## 2022-05-18 DIAGNOSIS — M545 Low back pain, unspecified: Secondary | ICD-10-CM | POA: Diagnosis present

## 2022-05-18 DIAGNOSIS — G8929 Other chronic pain: Secondary | ICD-10-CM | POA: Insufficient documentation

## 2022-05-18 NOTE — Patient Instructions (Signed)

## 2022-05-18 NOTE — Progress Notes (Signed)
PROVIDER NOTE: Information contained herein reflects review and annotations entered in association with encounter. Interpretation of such information and data should be left to medically-trained personnel. Information provided to patient can be located elsewhere in the medical record under "Patient Instructions". Document created using STT-dictation technology, any transcriptional errors that may result from process are unintentional.    Patient: Darren Allen  Service Category: E/M  Provider: Gaspar Cola, MD  DOB: [birth year]  DOS: 05/18/2022  Referring Provider: Jon Billings, NP  MRN: [id number]  Specialty: Interventional Pain Management  PCP: Jon Billings, NP  Type: Established Patient  Setting: Ambulatory outpatient    Location: Office  Delivery: Face-to-face     HPI  Darren Allen, a 57 y.o. year old male, is here today because of his Chronic bilateral low back pain without sciatica [M54.50, G89.29]. [name] primary complain today is Back Pain (lower) Last encounter: My last encounter with him was on 04/20/2022. Pertinent problems: [name] has Ankle fracture; Neuropathy; Diabetic peripheral neuropathy ([place]); Gout; Chronic ankle pain (Bilateral); Chronic low back pain (1ry area of Pain) (Bilateral) (R>L) w/o sciatica; Other acquired hammer toe; Chronic pain syndrome; Lumbar facet syndrome; Lumbosacral radiculopathy at S1 (Left); Chronic feet pain (2ry area of Pain) (Bilateral); Chronic ankle pain (Left); Abnormal NCS (nerve conduction studies) (02/20/2020); Abnormal MRI, lumbar spine (03/11/2020); DDD (degenerative disc disease), lumbosacral; Lumbosacral lateral recess stenosis (Left: L5-S1); and Chronic low back pain (Bilateral) w/ sciatica (Left) on their pertinent problem list. Pain Assessment: Severity of Chronic pain is reported as a 4 /10. Location: Back Left, Right/pain radiaties. Onset: More than a month ago. Quality: Aching, Burning, Constant, Throbbing, Shooting,  Stabbing. Timing: Constant. Modifying factor(s): Meds and laying down. Vitals:  height is '6\' 2"'$  (1.88 m) and weight is 229 lb (103.9 kg). His temperature is 97.6 F (36.4 C). His blood pressure is 103/76 and his pulse is 112 (abnormal). His oxygen saturation is 98%.  BMI: Estimated body mass index is 29.4 kg/m as calculated from the following:   Height as of this encounter: '6\' 2"'$  (1.88 m).   Weight as of this encounter: 229 lb (103.9 kg).  Reason for encounter: evaluation of worsening, or previously known (established) problem.  On 04/02/2022 the patient had his first left caudal ESI which provided him with 100% relief of the pain for the duration of the local anesthetic and this relief also continued at the time of his follow-up evaluation 2 weeks later.  Today he returns indicating that he is beginning to have some of the low back pain (Bilateral).  This pain is across his lower back and does not go down the legs.  He denies any lower extremity pain, numbness or weakness.  He refers the pain to be worse in the mornings when he first wakes up and he also refers the pain to worsen when he works in the yard.  Today's physical exam was positive for provocative Lumbar hyperextension and rotation maneuver which triggered ipsilateral low back pain, bilaterally, suggesting bilateral lumbar facet arthralgia/arthropathy.  This was concordant with his Lynelle Smoke maneuver which was also positive bilaterally for lumbar facet arthralgia.  He refers that the last procedure done (caudal ESI) provided him with quite a bit of benefit that lasted until recently when it started coming back.  He would like to have that one repeated.  RTCB: 07/26/22   Pharmacotherapy Assessment  Analgesic: Hydrocodone/APAP 5/325 tablet, 1 tab p.o. twice daily (#60) MME/day: 10 mg/day   Monitoring: [place] PMP:  PDMP reviewed during this encounter.       Pharmacotherapy: No side-effects or adverse reactions reported. Compliance: No problems  identified. Effectiveness: Clinically acceptable.  No notes on file  No results found for: "CBDTHCR" No results found for: "D8THCCBX" No results found for: "D9THCCBX"  UDS:  Summary  Date Value Ref Range Status  09/08/2021 Note  Final    Comment:    ==================================================================== Compliance Drug Analysis, Ur ==================================================================== Test                             Result       Flag       Units  Drug Present and Declared for Prescription Verification   Hydrocodone                    697          EXPECTED   ng/mg creat   Dihydrocodeine                 36           EXPECTED   ng/mg creat   Norhydrocodone                 1265         EXPECTED   ng/mg creat    Sources of hydrocodone include scheduled prescription medications.    Dihydrocodeine and norhydrocodone are expected metabolites of    hydrocodone. Dihydrocodeine is also available as a scheduled    prescription medication.    Gabapentin                     PRESENT      EXPECTED   Amitriptyline                  PRESENT      EXPECTED   Nortriptyline                  PRESENT      EXPECTED    Nortriptyline is an expected metabolite of amitriptyline.    Acetaminophen                  PRESENT      EXPECTED  Drug Absent but Declared for Prescription Verification   Metoprolol                     Not Detected UNEXPECTED ==================================================================== Test                      Result    Flag   Units      Ref Range   Creatinine              284              mg/dL      >=20 ==================================================================== Declared Medications:  The flagging and interpretation on this report are based on the  following declared medications.  Unexpected results may arise from  inaccuracies in the declared medications.   **Note: The testing scope of this panel includes these medications:    Amitriptyline (Elavil)  Gabapentin (Neurontin)  Hydrocodone (Norco)  Metoprolol   **Note: The testing scope of this panel does not include small to  moderate amounts of these reported medications:   Acetaminophen (Tylenol)  Acetaminophen (Norco)   **Note: The testing scope of this panel does not include the  following reported  medications:   Albuterol (Proair HFA)  Fluticasone (Flonase)  Fluticasone (Trelegy)  Hydrochlorothiazide  Insulin (Humulin)  Iron  Levalbuterol (Xopenex)  Lisinopril (Zestril)  Metformin  Montelukast  Pantoprazole (Protonix)  Rosuvastatin (Crestor)  Testosterone  Triamcinolone (Kenalog)  Umeclidinium (Trelegy)  Vilanterol (Trelegy)  Vitamin C ==================================================================== For clinical consultation, please call [phone]. ====================================================================       ROS  Constitutional: Denies any fever or chills Gastrointestinal: No reported hemesis, hematochezia, vomiting, or acute GI distress Musculoskeletal: Denies any acute onset joint swelling, redness, loss of ROM, or weakness Neurological: No reported episodes of acute onset apraxia, aphasia, dysarthria, agnosia, amnesia, paralysis, loss of coordination, or loss of consciousness  Medication Review  Dulaglutide, Fluticasone-Umeclidin-Vilant, HYDROcodone-acetaminophen, acetaminophen, albuterol, amitriptyline, ascorbic acid, ferrous sulfate, fluticasone, gabapentin, hydrochlorothiazide, insulin isophane & regular human KwikPen, lisinopril, meloxicam, metFORMIN, montelukast, naloxone, ondansetron, pantoprazole, rosuvastatin, tadalafil, testosterone cypionate, and triamcinolone cream  History Review  Allergy: Mr. Nuon is allergic to glipizide, cephalexin, and duloxetine. Drug: Mr. Higinbotham  reports no history of drug use. Alcohol:  reports that he does not currently use alcohol after a past usage of about 12.0 standard  drinks of alcohol per week. Tobacco:  reports that he has been smoking cigarettes. He has a 17.00 pack-year smoking history. He has never used smokeless tobacco. Social: Mr. Tetter  reports that he has been smoking cigarettes. He has a 17.00 pack-year smoking history. He has never used smokeless tobacco. He reports that he does not currently use alcohol after a past usage of about 12.0 standard drinks of alcohol per week. He reports that he does not use drugs. Medical:  has a past medical history of Allergy, Calculus of kidney (02/04/2015), COPD (chronic obstructive pulmonary disease) ([place]), Diabetes mellitus without complication ([place]), Hypertension, and Neuropathy. Surgical: Mr. Bubel  has a past surgical history that includes Appendectomy; Incision and drainage perirectal abscess (N/A, 02/04/2015); Rectal exam under anesthesia (02/04/2015); ORIF ankle fracture (Left, 03/23/2017); Syndesmosis repair (Left, 03/23/2017); Colonoscopy with propofol (N/A, 12/31/2021); Esophagogastroduodenoscopy (N/A, 12/31/2021); Kidney stone surgery (Right); lung mass removal (N/A); Xi robotic assisted paraesophageal hernia repair (N/A, 01/20/2022); Insertion of mesh ([id number]); and Umbilical hernia repair (01/20/2022). Family: family history includes Alcohol abuse in his father; Cancer in his father; Cancer (age of onset: 67) in his brother; Cancer (age of onset: 6) in his mother; Diabetes in his brother; Heart disease in his brother and father.  Laboratory Chemistry Profile   Renal Lab Results  Component Value Date   BUN 14 02/20/2022   CREATININE 1.13 02/20/2022   BCR 12 02/20/2022   GFRAA 59 (L) 12/07/2019   GFRNONAA >60 02/15/2022    Hepatic Lab Results  Component Value Date   AST 20 02/20/2022   ALT 19 02/20/2022   ALBUMIN 4.4 02/20/2022   ALKPHOS 86 02/20/2022   AMYLASE 93 02/04/2022   LIPASE 115 (H) 02/04/2022    Electrolytes Lab Results  Component Value Date   NA 139 02/20/2022   K 3.7  02/20/2022   CL 100 02/20/2022   CALCIUM 9.3 02/20/2022   MG 1.8 01/25/2022   PHOS 2.3 (L) 01/23/2022    Bone Lab Results  Component Value Date   25OHVITD1 44 09/08/2021   25OHVITD2 <1.0 09/08/2021   25OHVITD3 43 09/08/2021   TESTOSTERONE 559 03/23/2022    Inflammation (CRP: Acute Phase) (ESR: Chronic Phase) Lab Results  Component Value Date   CRP 1.8 (H) 09/08/2021   ESRSEDRATE 17 09/08/2021   LATICACIDVEN 1.6 02/03/2022  Note: Above Lab results reviewed.  Recent Imaging Review  DG PAIN CLINIC C-ARM 1-60 MIN NO REPORT Fluoro was used, but no Radiologist interpretation will be provided.  Please refer to "NOTES" tab for provider progress note. Note: Reviewed        Physical Exam  General appearance: Well nourished, well developed, and well hydrated. In no apparent acute distress Mental status: Alert, oriented x 3 (person, place, & time)       Respiratory: No evidence of acute respiratory distress Eyes: PERLA Vitals: BP 103/76   Pulse (!) 112   Temp 97.6 F (36.4 C)   Ht '6\' 2"'$  (1.88 m)   Wt 229 lb (103.9 kg)   SpO2 98%   BMI 29.40 kg/m  BMI: Estimated body mass index is 29.4 kg/m as calculated from the following:   Height as of this encounter: '6\' 2"'$  (1.88 m).   Weight as of this encounter: 229 lb (103.9 kg). Ideal: Ideal body weight: 82.2 kg (181 lb 3.5 oz) Adjusted ideal body weight: 90.9 kg (200 lb 5.3 oz)  Assessment   Diagnosis Status  1. Chronic low back pain (1ry area of Pain) (Bilateral) (R>L) w/o sciatica   2. DDD (degenerative disc disease), lumbosacral   3. 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 CT calcium score 216 in 2023     Plan of Care  Problem-specific:  No problem-specific Assessment & Plan notes found for this  encounter.  Mr. TYRANCE BRANIGAN has a current medication list which includes the following long-term medication(s): ferrous sulfate, fluticasone, hydrochlorothiazide, hydrocodone-acetaminophen, [START ON 05/27/2022] hydrocodone-acetaminophen, [START ON 06/26/2022] hydrocodone-acetaminophen, humulin 70/30 kwikpen, lisinopril, metformin, montelukast, pantoprazole, rosuvastatin, tadalafil, and testosterone cypionate.  Pharmacotherapy (Medications Ordered): No orders of the defined types were placed in this encounter.  Orders:  Orders Placed This Encounter  Procedures   Caudal Epidural Injection    Standing Status:   Future    Standing Expiration Date:   08/18/2022    Scheduling Instructions:     Laterality: Left-sided     Level(s): Sacrococcygeal canal (Tailbone area)     Sedation: Patient's choice     Scheduling Timeframe: As soon as pre-approved    Order Specific Question:   Where will this procedure be performed?    Answer:   [facility]   Follow-up plan:   Return for (Clinic): (L) Caudal ESI #2.      Interventional Therapies  Risk Factors  Considerations:   WNL   Planned  Pending:   Therapeutic midline to left caudal ESI #2    Under consideration:   Diagnostic bilateral lumbar facet MBB #1  Diagnostic/therapeutic left L5 & S1 TFESI #1  Possible spinal cord stimulator trial  Therapeutic left L5-S1 percutaneous discectomy with "[employer] Dekompressor" system    Completed:   Diagnostic midline to left caudal ESI x1 (04/02/2022) (100/100/100/LBP:100/LEP:100)  Diagnostic left L5-S1 LESI x1 (12/16/2021) (100/100/100/LBP:85  LEP:100)  Therapeutic bilateral Qutenza neurolytic treatment x1 (12/23/2021)  (09/08/2021 & 10/29/2021) referral to physical therapy for evaluation and treatment of low back pain.   Completed by other providers:   EMG/PNCV of lower extremity (02/20/2020) by Dr. Jennings Books (generalized sensorimotor peripheral neuropathy; superimposed left S1  radiculopathy)   Therapeutic  Palliative (PRN) options:   None established      Recent Visits  Date Type Provider Dept  04/20/22 Office Visit Milinda Pointer, MD [facility]  04/02/22 Procedure visit Milinda Pointer, MD [facility]  Showing recent visits within past 90 days and meeting all other requirements Today's Visits Date Type Provider Dept  05/18/22 Office Visit Milinda Pointer, MD [facility]  Showing today's visits and meeting all other requirements Future Appointments Date Type Provider Dept  07/22/22 Appointment Milinda Pointer, MD [facility]  Showing future appointments within next 90 days and meeting all other requirements  I discussed the assessment and treatment plan with the patient. The patient was provided an opportunity to ask questions and all were answered. The patient agreed with the plan and demonstrated an understanding of the instructions.  Patient advised to call back or seek an in-person evaluation if the symptoms or condition worsens.  Duration of encounter: 30 minutes.  Total time on encounter, as per AMA guidelines included both the face-to-face and non-face-to-face time personally spent by the physician and/or other qualified health care professional(s) on the day of the encounter (includes time in activities that require the physician or other qualified health care professional and does not include time in activities normally performed by clinical staff). Physician's time may include the following activities when performed: Preparing to see the patient (e.g., pre-charting review of records, searching for previously ordered imaging, lab work, and nerve conduction tests) Review of prior analgesic pharmacotherapies. Reviewing PMP Interpreting ordered tests (e.g., lab work, imaging, nerve conduction tests) Performing post-procedure evaluations, including interpretation of diagnostic procedures Obtaining  and/or reviewing separately obtained history Performing a medically appropriate examination and/or evaluation Counseling and educating the patient/family/caregiver Ordering medications, tests, or procedures Referring and communicating with other health care professionals (when not separately reported) Documenting clinical information in the electronic or other health record Independently interpreting results (not separately reported) and communicating results to the patient/ family/caregiver Care coordination (not separately reported)  Note by: Gaspar Cola, MD Date: 05/18/2022; Time: 2:25 PM

## 2022-05-22 ENCOUNTER — Ambulatory Visit (INDEPENDENT_AMBULATORY_CARE_PROVIDER_SITE_OTHER): Payer: 59 | Admitting: Nurse Practitioner

## 2022-05-22 ENCOUNTER — Encounter: Payer: Self-pay | Admitting: Nurse Practitioner

## 2022-05-22 VITALS — BP 136/72 | HR 96 | Temp 98.1°F | Wt 233.5 lb

## 2022-05-22 DIAGNOSIS — E1142 Type 2 diabetes mellitus with diabetic polyneuropathy: Secondary | ICD-10-CM

## 2022-05-22 DIAGNOSIS — J449 Chronic obstructive pulmonary disease, unspecified: Secondary | ICD-10-CM

## 2022-05-22 DIAGNOSIS — E1140 Type 2 diabetes mellitus with diabetic neuropathy, unspecified: Secondary | ICD-10-CM | POA: Diagnosis not present

## 2022-05-22 DIAGNOSIS — E785 Hyperlipidemia, unspecified: Secondary | ICD-10-CM

## 2022-05-22 DIAGNOSIS — I152 Hypertension secondary to endocrine disorders: Secondary | ICD-10-CM

## 2022-05-22 DIAGNOSIS — E1159 Type 2 diabetes mellitus with other circulatory complications: Secondary | ICD-10-CM | POA: Diagnosis not present

## 2022-05-22 DIAGNOSIS — E1169 Type 2 diabetes mellitus with other specified complication: Secondary | ICD-10-CM | POA: Diagnosis not present

## 2022-05-22 DIAGNOSIS — D509 Iron deficiency anemia, unspecified: Secondary | ICD-10-CM

## 2022-05-22 NOTE — Assessment & Plan Note (Signed)
Labs ordered at visit today.  Will make recommendations based on lab results.   

## 2022-05-22 NOTE — Progress Notes (Signed)
BP 136/72   Pulse 96   Temp 98.1 F (36.7 C) (Oral)   Wt 233 lb 8 oz (105.9 kg)   SpO2 96%   BMI 29.98 kg/m    Subjective:    Patient ID: Darren Allen, male    DOB: Oct 14, 1965, 57 y.o.   MRN: [id number]  HPI: Darren Allen is a 57 y.o. male  Chief Complaint  Patient presents with   Diabetes   Hyperlipidemia   Hypertension   HYPERTENSION / HYPERLIPIDEMIA Satisfied with current treatment? no Duration of hypertension: years BP monitoring frequency: daily BP range: 120/70 BP medication side effects: no Past BP meds: Metoprolol and lisinopril, HCTZ Duration of hyperlipidemia: years Cholesterol medication side effects: no Cholesterol supplements: none Past cholesterol medications: rosuvastatin (crestor) Medication compliance: excellent compliance Aspirin: no Recent stressors: no Recurrent headaches: no Visual changes: no Palpitationyes Chest pain: no Lower extremity edema: no Dizzy/lightheaded: no  DIABETES Trulicity was increased to 4.'5mg'$  two weeks Hypoglycemic episodes:no Polydipsia/polyuria: no Visual disturbance: no Chest pain: no Paresthesias: no Glucose Monitoring: yes  Accucheck frequency: daily  Fasting glucose: 80-90  Post prandial:  Evening:  Before meals: Taking Insulin?: yes  Long acting insulin: 60 in am and 42 in the pm  Short acting insulin:  Blood Pressure Monitoring: daily Retinal Examination: Up to date- [place] Foot Exam: Up to Date Diabetic Education: Not Completed Pneumovax: Up to Date Influenza: Not up to Date Aspirin: no  COPD COPD status: controlled Satisfied with current treatment?: no Oxygen use: no Dyspnea frequency: yes Cough frequency: yes Rescue inhaler frequency:  2-3x day Limitation of activity: yes Productive cough: yes Last Spirometry:  Pneumovax: Up to Date Influenza: Not up to Date See's Pulmonology  Leg cramping has improved.  Taking Magnesium and Iron every other day.    Relevant past medical,  surgical, family and social history reviewed and updated as indicated. Interim medical history since our last visit reviewed. Allergies and medications reviewed and updated.  Review of Systems  Eyes:  Negative for visual disturbance.  Respiratory:  Positive for shortness of breath. Negative for chest tightness.   Cardiovascular:  Negative for chest pain, palpitations and leg swelling.  Endocrine: Negative for polydipsia and polyuria.  Neurological:  Negative for dizziness, light-headedness, numbness and headaches.       Neuropathy    Per HPI unless specifically indicated above     Objective:    BP 136/72   Pulse 96   Temp 98.1 F (36.7 C) (Oral)   Wt 233 lb 8 oz (105.9 kg)   SpO2 96%   BMI 29.98 kg/m   Wt Readings from Last 3 Encounters:  05/22/22 233 lb 8 oz (105.9 kg)  05/18/22 229 lb (103.9 kg)  04/20/22 229 lb (103.9 kg)    Physical Exam Vitals and nursing note reviewed.  Constitutional:      General: He is not in acute distress.    Appearance: Normal appearance. He is not ill-appearing, toxic-appearing or diaphoretic.  HENT:     Head: Normocephalic.     Right Ear: External ear normal.     Left Ear: External ear normal.     Nose: Nose normal. No congestion or rhinorrhea.     Mouth/Throat:     Mouth: Mucous membranes are moist.  Eyes:     General:        Right eye: No discharge.        Left eye: No discharge.     Extraocular Movements: Extraocular movements intact.  Conjunctiva/sclera: Conjunctivae normal.     Pupils: Pupils are equal, round, and reactive to light.  Cardiovascular:     Rate and Rhythm: Normal rate and regular rhythm.     Heart sounds: No murmur heard. Pulmonary:     Effort: Pulmonary effort is normal. No respiratory distress.     Breath sounds: Wheezing present. No rhonchi or rales.  Abdominal:     General: Abdomen is flat. Bowel sounds are normal.  Musculoskeletal:     Cervical back: Normal range of motion and neck supple.  Skin:     General: Skin is warm and dry.     Capillary Refill: Capillary refill takes less than 2 seconds.  Neurological:     General: No focal deficit present.     Mental Status: He is alert and oriented to person, place, and time.  Psychiatric:        Mood and Affect: Mood normal.        Behavior: Behavior normal.        Thought Content: Thought content normal.        Judgment: Judgment normal.     Results for orders placed or performed in visit on 05/01/22  Iron, TIBC and Ferritin Panel  Result Value Ref Range   Total Iron Binding Capacity 460 (H) 250 - 450 ug/dL   UIBC 247 111 - 343 ug/dL   Iron 213 (H) 38 - 169 ug/dL   Iron Saturation 46 15 - 55 %   Ferritin 10 (L) 30 - 400 ng/mL  Microalbumin / creatinine urine ratio  Result Value Ref Range   Creatinine, Urine 224.0 Not Estab. mg/dL   Microalbumin, Urine 88.7 Not Estab. ug/mL   Microalb/Creat Ratio 40 (H) 0 - 29 mg/g creat  Specimen status report  Result Value Ref Range   specimen status report Comment       Assessment & Plan:   Problem List Items Addressed This Visit       Cardiovascular and Mediastinum   Hypertension associated with diabetes ([place]) - Primary    Chronic.  Controlled. With Lisinopril and HCTZ. Continue to check blood pressures at home.  Labs ordered today. Will make recommendations based on lab results.  Follow up in 3 months.  Call sooner if concerns arise.        Relevant Orders   Comp Met (CMET)     Respiratory   Chronic obstructive pulmonary disease (HCC) (Chronic)    Chronic.  Controlled.  Using maintenance inhalers daily.  Using albuterol about 2-3 x daily.  Still wheezing on exam.  Continue with inhalers at home.  Continue to follow up with Pulmonology.  Follow up in 3 months.  Call sooner if concerns arise.         Endocrine   Diabetic peripheral neuropathy (HCC) (Chronic)    Chronic.  Controlled.  Continue with current medication regimen.  Followed by pain management.  Doing well at this time.   Labs ordered today.  Return to clinic in 3 months for reevaluation.  Call sooner if concerns arise.        Hyperlipidemia associated with type 2 diabetes mellitus (HCC)    Chronic.  Controlled.  Continue with current medication regimen of Crestor '40mg'$ .  Labs ordered today.  Return to clinic in 3 months for reevaluation.  Call sooner if concerns arise.        Relevant Orders   Lipid Profile   Type 2 diabetes mellitus with diabetic neuropathy, without long-term current use  of insulin (HCC)    Chronic. Not well controlled.  See's Endocrinology.  Currently on Trulicity, Metformin and long acting insulin.  Trulicity increased to 4.'5mg'$  weekly.  Insulin also increased 2 weeks ago.  Recently got continuous glucose monitor.  Sugars are running 80- <200.  Continue to follow their recommendations.  Last A1c in February 8.4%. Follow up in 3 months. Call sooner if concerns arise.         Other   Iron deficiency anemia    Labs ordered at visit today.  Will make recommendations based on lab results.        Relevant Orders   CBC w/Diff     Follow up plan: Return in about 3 months (around 08/22/2022) for HTN, HLD, DM2 FU.

## 2022-05-22 NOTE — Assessment & Plan Note (Signed)
Chronic.  Controlled.  Using maintenance inhalers daily.  Using albuterol about 2-3 x daily.  Still wheezing on exam.  Continue with inhalers at home.  Continue to follow up with Pulmonology.  Follow up in 3 months.  Call sooner if concerns arise.

## 2022-05-22 NOTE — Assessment & Plan Note (Signed)
Chronic.  Controlled. With Lisinopril and HCTZ. Continue to check blood pressures at home.  Labs ordered today. Will make recommendations based on lab results.  Follow up in 3 months.  Call sooner if concerns arise.

## 2022-05-22 NOTE — Assessment & Plan Note (Signed)
Chronic. Not well controlled.  See's Endocrinology.  Currently on Trulicity, Metformin and long acting insulin.  Trulicity increased to 4.'5mg'$  weekly.  Insulin also increased 2 weeks ago.  Recently got continuous glucose monitor.  Sugars are running 80- <200.  Continue to follow their recommendations.  Last A1c in February 8.4%. Follow up in 3 months. Call sooner if concerns arise.

## 2022-05-22 NOTE — Assessment & Plan Note (Signed)
Chronic.  Controlled.  Continue with current medication regimen.  Followed by pain management.  Doing well at this time.  Labs ordered today.  Return to clinic in 3 months for reevaluation.  Call sooner if concerns arise.   

## 2022-05-22 NOTE — Assessment & Plan Note (Signed)
Chronic.  Controlled.  Continue with current medication regimen of Crestor 40mg.  Labs ordered today.  Return to clinic in 3 months for reevaluation.  Call sooner if concerns arise.   

## 2022-05-23 LAB — COMPREHENSIVE METABOLIC PANEL
ALT: 21 IU/L (ref 0–44)
AST: 17 IU/L (ref 0–40)
Albumin/Globulin Ratio: 1.9 (ref 1.2–2.2)
Albumin: 4.5 g/dL (ref 3.8–4.9)
Alkaline Phosphatase: 84 IU/L (ref 44–121)
BUN/Creatinine Ratio: 13 (ref 9–20)
BUN: 16 mg/dL (ref 6–24)
Bilirubin Total: 0.4 mg/dL (ref 0.0–1.2)
CO2: 23 mmol/L (ref 20–29)
Calcium: 9.9 mg/dL (ref 8.7–10.2)
Chloride: 99 mmol/L (ref 96–106)
Creatinine, Ser: 1.22 mg/dL (ref 0.76–1.27)
Globulin, Total: 2.4 g/dL (ref 1.5–4.5)
Glucose: 193 mg/dL — ABNORMAL HIGH (ref 70–99)
Potassium: 4.3 mmol/L (ref 3.5–5.2)
Sodium: 137 mmol/L (ref 134–144)
Total Protein: 6.9 g/dL (ref 6.0–8.5)
eGFR: 68 mL/min/{1.73_m2} (ref >59)

## 2022-05-23 LAB — CBC WITH DIFFERENTIAL/PLATELET
Basophils Absolute: 0.1 10*3/uL (ref 0.0–0.2)
Basos: 1 %
EOS (ABSOLUTE): 0.3 10*3/uL (ref 0.0–0.4)
Eos: 5 %
Hematocrit: 40.1 % (ref 37.5–51.0)
Hemoglobin: 12 g/dL — ABNORMAL LOW (ref 13.0–17.7)
Immature Grans (Abs): 0 10*3/uL (ref 0.0–0.1)
Immature Granulocytes: 0 %
Lymphocytes Absolute: 1.6 10*3/uL (ref 0.7–3.1)
Lymphs: 21 %
MCH: 22.6 pg — ABNORMAL LOW (ref 26.6–33.0)
MCHC: 29.9 g/dL — ABNORMAL LOW (ref 31.5–35.7)
MCV: 76 fL — ABNORMAL LOW (ref 79–97)
Monocytes Absolute: 0.7 10*3/uL (ref 0.1–0.9)
Monocytes: 10 %
Neutrophils Absolute: 4.8 10*3/uL (ref 1.4–7.0)
Neutrophils: 63 %
Platelets: 276 10*3/uL (ref 150–450)
RBC: 5.31 x10E6/uL (ref 4.14–5.80)
RDW: 18.2 % — ABNORMAL HIGH (ref 11.6–15.4)
WBC: 7.5 10*3/uL (ref 3.4–10.8)

## 2022-05-23 LAB — LIPID PANEL
Chol/HDL Ratio: 4.3 ratio (ref 0.0–5.0)
Cholesterol, Total: 107 mg/dL (ref 100–199)
HDL: 25 mg/dL — ABNORMAL LOW (ref >39)
LDL Chol Calc (NIH): 16 mg/dL (ref 0–99)
Triglycerides: 496 mg/dL — ABNORMAL HIGH (ref 0–149)
VLDL Cholesterol Cal: 66 mg/dL — ABNORMAL HIGH (ref 5–40)

## 2022-05-25 NOTE — Progress Notes (Signed)
Hi Darren Allen. It was nice to see you last week.  Your lab work looks good.  Your triglycerides are elevated.  Likely due to your A1c increasing.  Your anemia is improving.  No concerns at this time. Continue with your current medication regimen.  Follow up as discussed.  Please let me know if you have any questions.

## 2022-05-26 ENCOUNTER — Ambulatory Visit
Admission: RE | Admit: 2022-05-26 | Discharge: 2022-05-26 | Disposition: A | Discharge status: Home / Self Care | Payer: 59 | Source: Ambulatory Visit | Attending: Pain Medicine | Admitting: Pain Medicine

## 2022-05-26 ENCOUNTER — Ambulatory Visit: Payer: 59 | Attending: Pain Medicine | Admitting: Pain Medicine

## 2022-05-26 ENCOUNTER — Encounter: Payer: Self-pay | Admitting: Pain Medicine

## 2022-05-26 VITALS — BP 121/88 | HR 86 | Temp 97.3°F | Resp 16 | Ht 74.0 in | Wt 230.0 lb

## 2022-05-26 DIAGNOSIS — M5137 Other intervertebral disc degeneration, lumbosacral region: Secondary | ICD-10-CM | POA: Diagnosis present

## 2022-05-26 DIAGNOSIS — M545 Low back pain, unspecified: Secondary | ICD-10-CM | POA: Insufficient documentation

## 2022-05-26 DIAGNOSIS — M5442 Lumbago with sciatica, left side: Secondary | ICD-10-CM | POA: Insufficient documentation

## 2022-05-26 DIAGNOSIS — M47816 Spondylosis without myelopathy or radiculopathy, lumbar region: Secondary | ICD-10-CM | POA: Diagnosis present

## 2022-05-26 DIAGNOSIS — G8929 Other chronic pain: Secondary | ICD-10-CM | POA: Diagnosis present

## 2022-05-26 DIAGNOSIS — M5417 Radiculopathy, lumbosacral region: Secondary | ICD-10-CM | POA: Diagnosis present

## 2022-05-26 DIAGNOSIS — M4807 Spinal stenosis, lumbosacral region: Secondary | ICD-10-CM | POA: Diagnosis present

## 2022-05-26 MED ORDER — SODIUM CHLORIDE 0.9% FLUSH
2.0000 mL | Freq: Once | INTRAVENOUS | Status: AC
  Administered 2022-05-26: 2 mL

## 2022-05-26 MED ORDER — IOHEXOL 180 MG/ML  SOLN
10.0000 mL | Freq: Once | INTRAMUSCULAR | Status: AC
  Administered 2022-05-26: 10 mL via EPIDURAL
  Filled 2022-05-26: qty 20

## 2022-05-26 MED ORDER — ROPIVACAINE HCL 2 MG/ML IJ SOLN
2.0000 mL | Freq: Once | INTRAMUSCULAR | Status: AC
  Administered 2022-05-26: 2 mL via EPIDURAL
  Filled 2022-05-26: qty 20

## 2022-05-26 MED ORDER — PENTAFLUOROPROP-TETRAFLUOROETH EX AERO
INHALATION_SPRAY | Freq: Once | CUTANEOUS | Status: AC
  Administered 2022-05-26: 30 via TOPICAL
  Filled 2022-05-26: qty 116

## 2022-05-26 MED ORDER — LIDOCAINE HCL 2 % IJ SOLN
20.0000 mL | Freq: Once | INTRAMUSCULAR | Status: AC
  Administered 2022-05-26: 400 mg
  Filled 2022-05-26: qty 20

## 2022-05-26 MED ORDER — TRIAMCINOLONE ACETONIDE 40 MG/ML IJ SUSP
40.0000 mg | Freq: Once | INTRAMUSCULAR | Status: AC
  Administered 2022-05-26: 40 mg
  Filled 2022-05-26: qty 1

## 2022-05-26 NOTE — Patient Instructions (Signed)

## 2022-05-26 NOTE — Progress Notes (Signed)
PROVIDER NOTE: Interpretation of information contained herein should be left to medically-trained personnel. Specific patient instructions are provided elsewhere under "Patient Instructions" section of medical record. This document was created in part using STT-dictation technology, any transcriptional errors that may result from this process are unintentional.  Patient: Darren Allen Type: Established DOB: Aug 08, 1965 MRN: [id number] PCP: Jon Billings, NP  Service: Procedure DOS: 05/26/2022 Setting: Ambulatory Location: Ambulatory outpatient facility Delivery: Face-to-face Provider: Gaspar Cola, MD Specialty: Interventional Pain Management Specialty designation: 09 Location: Outpatient facility Ref. Prov.: Milinda Pointer, MD       Interventional Therapy   Procedure: Lumbar epidural steroid injection (LESI) (interlaminar) #2    Laterality: Left   Level:  L5-S1 Level.  Imaging: Fluoroscopic guidance         Anesthesia: Local anesthesia (1-2% Lidocaine) Anxiolysis: None                 Sedation: No Sedation                       DOS: 05/26/2022  Performed by: Gaspar Cola, MD  Purpose: Diagnostic/Therapeutic Indications: Lumbar radicular pain of intraspinal etiology of more than 4 weeks that has failed to respond to conservative therapy and is severe enough to impact quality of life or function. 1. Lumbosacral radiculopathy at S1 (Left)   2. Chronic low back pain (Bilateral) w/ sciatica (Left)   3. DDD (degenerative disc disease), lumbosacral   4. Lumbosacral lateral recess stenosis (Left: L5-S1)   5. Chronic low back pain (1ry area of Pain) (Bilateral) (R>L) w/o sciatica    NAS-11 Pain score:   Pre-procedure: 4 /10   Post-procedure: 2 /10   Note: Today on arrival, the patient requested to have the caudal epidural switch to a lumbar epidural since he refers that the LESI provided him with better relief of his low back pain and it lasted longer.     Position / Prep / Materials:  Position: Prone w/ head of the table raised (slight reverse trendelenburg) to facilitate breathing.  Prep solution: DuraPrep (Iodine Povacrylex [0.7% available iodine] and Isopropyl Alcohol, 74% w/w) Prep Area: Entire Posterior Lumbar Region from lower scapular tip down to mid buttocks area and from flank to flank. Materials:  Tray: Epidural tray Needle(s):  Type: Epidural needle (Tuohy) Gauge (G):  17 Length: Regular (3.5-in) Qty: 1   Pre-op H&P Assessment:  Darren Allen is a 57 y.o. (year old), male patient, seen today for interventional treatment. He  has a past surgical history that includes Appendectomy; Incision and drainage perirectal abscess (N/A, 02/04/2015); Rectal exam under anesthesia (02/04/2015); ORIF ankle fracture (Left, 03/23/2017); Syndesmosis repair (Left, 03/23/2017); Colonoscopy with propofol (N/A, 12/31/2021); Esophagogastroduodenoscopy (N/A, 12/31/2021); Kidney stone surgery (Right); lung mass removal (N/A); Xi robotic assisted paraesophageal hernia repair (N/A, 01/20/2022); Insertion of mesh ([id number]); and Umbilical hernia repair (01/20/2022). Darren Allen has a current medication list which includes the following prescription(s): acetaminophen, amitriptyline, ascorbic acid, ferrous sulfate, fluticasone, gabapentin, hydrochlorothiazide, hydrocodone-acetaminophen, [START ON 05/27/2022] hydrocodone-acetaminophen, [START ON 06/26/2022] hydrocodone-acetaminophen, humulin 70/30 kwikpen, lisinopril, meloxicam, metformin, montelukast, naloxone, ondansetron, proair hfa, rosuvastatin, tadalafil, testosterone cypionate, trelegy ellipta, triamcinolone cream, and trulicity. His primarily concern today is the Back Pain (lower)  Initial Vital Signs:  Pulse/HCG Rate: 100  Temp: (!) 97.3 F (36.3 C) Resp: (!) 100 BP: (!) 144/89 SpO2: 98 %  BMI: Estimated body mass index is 29.53 kg/m as calculated from the following:   Height as of this encounter: '6\' 2"'$   (  1.88 m).   Weight as of this encounter: 230 lb (104.3 kg).  Risk Assessment: Allergies: Reviewed. He is allergic to glipizide, cephalexin, and duloxetine.  Allergy Precautions: None required Coagulopathies: Reviewed. None identified.  Blood-thinner therapy: None at this time Active Infection(s): Reviewed. None identified. Darren Allen is afebrile  Site Confirmation: Darren Allen was asked to confirm the procedure and laterality before marking the site Procedure checklist: Completed Consent: Before the procedure and under the influence of no sedative(s), amnesic(s), or anxiolytics, the patient was informed of the treatment options, risks and possible complications. To fulfill our ethical and legal obligations, as recommended by the American Medical Association's Code of Ethics, I have informed the patient of my clinical impression; the nature and purpose of the treatment or procedure; the risks, benefits, and possible complications of the intervention; the alternatives, including doing nothing; the risk(s) and benefit(s) of the alternative treatment(s) or procedure(s); and the risk(s) and benefit(s) of doing nothing. The patient was provided information about the general risks and possible complications associated with the procedure. These may include, but are not limited to: failure to achieve desired goals, infection, bleeding, organ or nerve damage, allergic reactions, paralysis, and death. In addition, the patient was informed of those risks and complications associated to Spine-related procedures, such as failure to decrease pain; infection (i.e.: Meningitis, epidural or intraspinal abscess); bleeding (i.e.: epidural hematoma, subarachnoid hemorrhage, or any other type of intraspinal or peri-dural bleeding); organ or nerve damage (i.e.: Any type of peripheral nerve, nerve root, or spinal cord injury) with subsequent damage to sensory, motor, and/or autonomic systems, resulting in permanent pain,  numbness, and/or weakness of one or several areas of the body; allergic reactions; (i.e.: anaphylactic reaction); and/or death. Furthermore, the patient was informed of those risks and complications associated with the medications. These include, but are not limited to: allergic reactions (i.e.: anaphylactic or anaphylactoid reaction(s)); adrenal axis suppression; blood sugar elevation that in diabetics may result in ketoacidosis or comma; water retention that in patients with history of congestive heart failure may result in shortness of breath, pulmonary edema, and decompensation with resultant heart failure; weight gain; swelling or edema; medication-induced neural toxicity; particulate matter embolism and blood vessel occlusion with resultant organ, and/or nervous system infarction; and/or aseptic necrosis of one or more joints. Finally, the patient was informed that Medicine is not an exact science; therefore, there is also the possibility of unforeseen or unpredictable risks and/or possible complications that may result in a catastrophic outcome. The patient indicated having understood very clearly. We have given the patient no guarantees and we have made no promises. Enough time was given to the patient to ask questions, all of which were answered to the patient's satisfaction. Mr. Krienke has indicated that he wanted to continue with the procedure. Attestation: I, the ordering provider, attest that I have discussed with the patient the benefits, risks, side-effects, alternatives, likelihood of achieving goals, and potential problems during recovery for the procedure that I have provided informed consent. Date  Time: 05/26/2022  9:52 AM   Pre-Procedure Preparation:  Monitoring: As per clinic protocol. Respiration, ETCO2, SpO2, BP, heart rate and rhythm monitor placed and checked for adequate function Safety Precautions: Patient was assessed for positional comfort and pressure points before starting  the procedure. Time-out: I initiated and conducted the "Time-out" before starting the procedure, as per protocol. The patient was asked to participate by confirming the accuracy of the "Time Out" information. Verification of the correct person, site, and procedure were performed  and confirmed by me, the nursing staff, and the patient. "Time-out" conducted as per Joint Commission's Universal Protocol (UP.01.01.01). Time: 1121  Description/Narrative of Procedure:          Target: Epidural space via interlaminar opening, initially targeting the lower laminar border of the superior vertebral body. Region: Lumbar Approach: Percutaneous paravertebral  Rationale (medical necessity): procedure needed and proper for the diagnosis and/or treatment of the patient's medical symptoms and needs. Procedural Technique Safety Precautions: Aspiration looking for blood return was conducted prior to all injections. At no point did we inject any substances, as a needle was being advanced. No attempts were made at seeking any paresthesias. Safe injection practices and needle disposal techniques used. Medications properly checked for expiration dates. SDV (single dose vial) medications used. Description of the Procedure: Protocol guidelines were followed. The procedure needle was introduced through the skin, ipsilateral to the reported pain, and advanced to the target area. Bone was contacted and the needle walked caudad, until the lamina was cleared. The epidural space was identified using "loss-of-resistance technique" with 2-3 ml of PF-NaCl (0.9% NSS), in a 5cc LOR glass syringe.  Vitals:   05/26/22 0952 05/26/22 1117 05/26/22 1126 05/26/22 1130  BP: (!) 144/89 (!) 112/90 (!) 123/90 121/88  Pulse: 100 88 87 86  Resp: (!) 100 '16 15 16  '$ Temp: (!) 97.3 F (36.3 C)     TempSrc: Temporal     SpO2:  98% 99% 98%  Weight: 230 lb (104.3 kg)     Height: '6\' 2"'$  (1.88 m)       Start Time: 1121 hrs. End Time: 1128  hrs.  Imaging Guidance (Spinal):          Type of Imaging Technique: Fluoroscopy Guidance (Spinal) Indication(s): Assistance in needle guidance and placement for procedures requiring needle placement in or near specific anatomical locations not easily accessible without such assistance. Exposure Time: Please see nurses notes. Contrast: Before injecting any contrast, we confirmed that the patient did not have an allergy to iodine, shellfish, or radiological contrast. Once satisfactory needle placement was completed at the desired level, radiological contrast was injected. Contrast injected under live fluoroscopy. No contrast complications. See chart for type and volume of contrast used. Fluoroscopic Guidance: I was personally present during the use of fluoroscopy. "Tunnel Vision Technique" used to obtain the best possible view of the target area. Parallax error corrected before commencing the procedure. "Direction-depth-direction" technique used to introduce the needle under continuous pulsed fluoroscopy. Once target was reached, antero-posterior, oblique, and lateral fluoroscopic projection used confirm needle placement in all planes. Images permanently stored in EMR. Interpretation: I personally interpreted the imaging intraoperatively. Adequate needle placement confirmed in multiple planes. Appropriate spread of contrast into desired area was observed. No evidence of afferent or efferent intravascular uptake. No intrathecal or subarachnoid spread observed. Permanent images saved into the patient's record.  Antibiotic Prophylaxis:   Anti-infectives (From admission, onward)    None      Indication(s): None identified  Post-operative Assessment:  Post-procedure Vital Signs:  Pulse/HCG Rate: 86  Temp: (!) 97.3 F (36.3 C) Resp: 16 BP: 121/88 SpO2: 98 %  EBL: None  Complications: No immediate post-treatment complications observed by team, or reported by patient.  Note: The patient  tolerated the entire procedure well. A repeat set of vitals were taken after the procedure and the patient was kept under observation following institutional policy, for this type of procedure. Post-procedural neurological assessment was performed, showing return to baseline, prior to discharge.  The patient was provided with post-procedure discharge instructions, including a section on how to identify potential problems. Should any problems arise concerning this procedure, the patient was given instructions to immediately contact us, at any time, without hesitation. In any case, we plan to contact the patient by telephone for a follow-up status report regarding this interventional procedure.  Comments:  No additional relevant information.  Plan of Care (POC)  Orders:  Orders Placed This Encounter  Procedures   Caudal Epidural Injection    Scheduling Instructions:     Laterality: Left-sided     Level(s): Sacrococcygeal canal (Tailbone area)     Sedation: Patient's choice     Timeframe: Today    Order Specific Question:   Where will this procedure be performed?    Answer:   [facility]   DG [facility] C-ARM 1-60 MIN NO REPORT    Intraoperative interpretation by procedural physician at [place].    Standing Status:   Standing    Number of Occurrences:   1    Order Specific Question:   Reason for exam:    Answer:   Assistance in needle guidance and placement for procedures requiring needle placement in or near specific anatomical locations not easily accessible without such assistance.   Informed Consent Details: Physician/Practitioner Attestation; Transcribe to consent form and obtain patient signature    Nursing Order: Transcribe to consent form and obtain patient signature. Note: Always confirm laterality of pain with Mr. Woolridge, before procedure.    Order Specific Question:   Physician/Practitioner attestation of informed consent for procedure/surgical case     Answer:   I, the physician/practitioner, attest that I have discussed with the patient the benefits, risks, side effects, alternatives, likelihood of achieving goals and potential problems during recovery for the procedure that I have provided informed consent.    Order Specific Question:   Procedure    Answer:   Caudal epidural steroid injection    Order Specific Question:   Physician/Practitioner performing the procedure    Answer:   Jestin Burbach A. Dossie Arbour, MD    Order Specific Question:   Indication/Reason    Answer:   Low back pain and lower extremity pain secondary to lumbosacral radiculitis   Provide equipment / supplies at bedside    Procedural tray: Epidural Tray (Disposable  single use) Skin infiltration needle: Regular 1.5-in, 25-G, (x1) Block needle size: Regular standard Catheter: No catheter required    Standing Status:   Standing    Number of Occurrences:   1    Order Specific Question:   Specify    Answer:   Epidural Tray   Chronic Opioid Analgesic:  Hydrocodone/APAP 5/325 tablet, 1 tab p.o. twice daily (#60) MME/day: 10 mg/day   Medications ordered for procedure: Meds ordered this encounter  Medications   iohexol (OMNIPAQUE) 180 MG/ML injection 10 mL    Must be Myelogram-compatible. If not available, you may substitute with a water-soluble, non-ionic, hypoallergenic, myelogram-compatible radiological contrast medium.   lidocaine (XYLOCAINE) 2 % (with pres) injection 400 mg   pentafluoroprop-tetrafluoroeth (GEBAUERS) aerosol   sodium chloride flush (NS) 0.9 % injection 2 mL   ropivacaine (PF) 2 mg/mL (0.2%) (NAROPIN) injection 2 mL   triamcinolone acetonide (KENALOG-40) injection 40 mg   Medications administered: We administered iohexol, lidocaine, pentafluoroprop-tetrafluoroeth, sodium chloride flush, ropivacaine (PF) 2 mg/mL (0.2%), and triamcinolone acetonide.  See the medical record for exact dosing, route, and time of administration.  Follow-up plan:   Return in  about  2 weeks (around 06/09/2022) for Proc-day (T,Th), (Face2F), (PPE).       Interventional Therapies  Risk Factors  Considerations:   WNL   Planned  Pending:   Therapeutic left L5-S1 LESI #2 (05/26/2022)    Under consideration:   Diagnostic bilateral lumbar facet MBB #1  Diagnostic/therapeutic left L5 & S1 TFESI #1  Possible spinal cord stimulator trial  Therapeutic left L5-S1 percutaneous discectomy with "[employer] Dekompressor" system    Completed:   Diagnostic midline to left caudal ESI x1 (04/02/2022) (100/100/100/LBP:100/LEP:100)  Diagnostic left L5-S1 LESI x1 (12/16/2021) (100/100/100/LBP:85  LEP:100)  Therapeutic bilateral Qutenza neurolytic treatment x1 (12/23/2021)  (09/08/2021 & 10/29/2021) referral to physical therapy for evaluation and treatment of low back pain.   Completed by other providers:   EMG/PNCV of lower extremity (02/20/2020) by Dr. Jennings Books (generalized sensorimotor peripheral neuropathy; superimposed left S1 radiculopathy)   Therapeutic  Palliative (PRN) options:   None established       Recent Visits Date Type Provider Dept  05/18/22 Office Visit Milinda Pointer, MD [facility]  04/20/22 Office Visit Milinda Pointer, MD [facility]  04/02/22 Procedure visit Milinda Pointer, MD [facility]  Showing recent visits within past 90 days and meeting all other requirements Today's Visits Date Type Provider Dept  05/26/22 Procedure visit Milinda Pointer, MD [facility]  Showing today's visits and meeting all other requirements Future Appointments Date Type Provider Dept  07/22/22 Appointment Milinda Pointer, MD [facility]  Showing future appointments within next 90 days and meeting all other requirements  Disposition: Discharge home  Discharge (Date  Time): 05/26/2022; 1133 hrs.   Primary Care Physician: Jon Billings, NP Location: [facility] Note by:  Gaspar Cola, MD (TTS technology used. I apologize for any typographical errors that were not detected and corrected.) Date: 05/26/2022; Time: 11:33 AM  Disclaimer:  Medicine is not an Chief Strategy Officer. The only guarantee in medicine is that nothing is guaranteed. It is important to note that the decision to proceed with this intervention was based on the information collected from the patient. The Data and conclusions were drawn from the patient's questionnaire, the interview, and the physical examination. Because the information was provided in large part by the patient, it cannot be guaranteed that it has not been purposely or unconsciously manipulated. Every effort has been made to obtain as much relevant data as possible for this evaluation. It is important to note that the conclusions that lead to this procedure are derived in large part from the available data. Always take into account that the treatment will also be dependent on availability of resources and existing treatment guidelines, considered by other Pain Management Practitioners as being common knowledge and practice, at the time of the intervention. For Medico-Legal purposes, it is also important to point out that variation in procedural techniques and pharmacological choices are the acceptable norm. The indications, contraindications, technique, and results of the above procedure should only be interpreted and judged by a Board-Certified Interventional Pain Specialist with extensive familiarity and expertise in the same exact procedure and technique.

## 2022-05-27 ENCOUNTER — Telehealth: Payer: Self-pay

## 2022-05-27 NOTE — Telephone Encounter (Signed)
Post procedure follow up.  LM 

## 2022-06-01 ENCOUNTER — Ambulatory Visit (INDEPENDENT_AMBULATORY_CARE_PROVIDER_SITE_OTHER): Payer: 59

## 2022-06-01 VITALS — Ht 74.0 in | Wt 233.0 lb

## 2022-06-01 DIAGNOSIS — Z0000 Encounter for general adult medical examination without abnormal findings: Secondary | ICD-10-CM

## 2022-06-01 NOTE — Progress Notes (Signed)
I connected with  Birt A Bendall on 06/01/22 by a audio enabled telemedicine application and verified that I am speaking with the correct person using two identifiers.  Patient Location: Home  Provider Location: Office/Clinic  I discussed the limitations of evaluation and management by telemedicine. The patient expressed understanding and agreed to proceed.  Subjective:   RODDIE SPOSATO is a 57 y.o. male who presents for an Initial Medicare Annual Wellness Visit.  Review of Systems     Cardiac Risk Factors include: advanced age (>72men, >79 women);diabetes mellitus;hypertension;male gender     Objective:    Today's Vitals   06/01/22 1426  PainSc: 3    There is no height or weight on file to calculate BMI.     06/01/2022    2:32 PM 05/26/2022    9:55 AM 04/20/2022   11:38 AM 02/03/2022    2:00 PM 01/20/2022   10:09 AM 12/31/2021    7:10 AM 12/16/2021    9:02 AM  Advanced Directives  Does Patient Have a Medical Advance Directive? No No No No No No No  Would patient like information on creating a medical advance directive?     No - Patient declined  No - Patient declined    Current Medications (verified) Outpatient Encounter Medications as of 06/01/2022  Medication Sig   acetaminophen (TYLENOL) 500 MG tablet Take 2 tablets (1,000 mg total) by mouth every 8 (eight) hours.   amitriptyline (ELAVIL) 10 MG tablet Take 40 mg by mouth at bedtime.   ascorbic acid (VITAMIN C) 500 MG tablet Take by mouth daily.   ferrous sulfate 325 (65 FE) MG tablet Take 325 mg by mouth daily with breakfast.   fluticasone (FLONASE) 50 MCG/ACT nasal spray Place into both nostrils.   gabapentin (NEURONTIN) 600 MG tablet Take 600 mg by mouth 3 (three) times daily.   hydrochlorothiazide (HYDRODIURIL) 25 MG tablet Take 0.5 tablets (12.5 mg total) by mouth daily. (Patient taking differently: Take 25 mg by mouth daily.)   HYDROcodone-acetaminophen (NORCO/VICODIN) 5-325 MG tablet Take 1 tablet by mouth 2  (two) times daily as needed. Must last 30 days.   [START ON 06/26/2022] HYDROcodone-acetaminophen (NORCO/VICODIN) 5-325 MG tablet Take 1 tablet by mouth 2 (two) times daily as needed. Must last 30 days.   insulin isophane & regular human (HUMULIN 70/30 KWIKPEN) (70-30) 100 UNIT/ML KwikPen Inject 58 Units into the skin 2 (two) times daily with a meal. 58 with meals and 54 qhs   lisinopril (ZESTRIL) 20 MG tablet Take 1 tablet (20 mg total) by mouth daily.   metFORMIN (GLUCOPHAGE-XR) 500 MG 24 hr tablet TAKE 4 TABLETS(2000 MG) BY MOUTH DAILY   montelukast (SINGULAIR) 10 MG tablet Take 10 mg by mouth daily.   naloxone (NARCAN) nasal spray 4 mg/0.1 mL Place 1 spray into the nose as needed for up to 365 doses (for opioid-induced respiratory depresssion). In case of emergency (overdose), spray once into each nostril. If no response within 3 minutes, repeat application and call [id number].   ondansetron (ZOFRAN) 4 MG tablet Take 1 tablet (4 mg total) by mouth every 8 (eight) hours as needed for nausea or vomiting.   PROAIR HFA 108 (90 Base) MCG/ACT inhaler Inhale 2 puffs into the lungs every 6 (six) hours as needed.   rosuvastatin (CRESTOR) 40 MG tablet Take 1 tablet (40 mg total) by mouth daily.   tadalafil (CIALIS) 5 MG tablet Take 1 tablet (5 mg total) by mouth daily as needed for erectile dysfunction.  testosterone cypionate (DEPOTESTOSTERONE CYPIONATE) 200 MG/ML injection Inject 0.5 mLs (100 mg total) into the muscle every 14 (fourteen) days.   TRELEGY ELLIPTA 200-62.5-25 MCG/INH AEPB Inhale 1 puff into the lungs daily.   triamcinolone cream (KENALOG) 0.1 % Apply 1 application  topically 2 (two) times daily.   TRULICITY 3 0000000 SOPN Inject 4.5 mg into the skin once a week.   HYDROcodone-acetaminophen (NORCO/VICODIN) 5-325 MG tablet Take 1 tablet by mouth 2 (two) times daily as needed. Must last 30 days.   meloxicam (MOBIC) 15 MG tablet Take 15 mg by mouth. (Patient not taking: Reported on 06/01/2022)   No  facility-administered encounter medications on file as of 06/01/2022.    Allergies (verified) Glipizide, Cephalexin, and Duloxetine   History: Past Medical History:  Diagnosis Date   Allergy    Calculus of kidney 02/04/2015   COPD (chronic obstructive pulmonary disease) (HCC)    Diabetes mellitus without complication ([place])    type 2   Hypertension    Neuropathy    Past Surgical History:  Procedure Laterality Date   APPENDECTOMY     COLONOSCOPY WITH PROPOFOL N/A 12/31/2021   Procedure: COLONOSCOPY WITH PROPOFOL;  Surgeon: Jonathon Bellows, MD;  Location: [facility] ENDOSCOPY;  Service: Gastroenterology;  Laterality: N/A;   ESOPHAGOGASTRODUODENOSCOPY N/A 12/31/2021   Procedure: ESOPHAGOGASTRODUODENOSCOPY (EGD);  Surgeon: Jonathon Bellows, MD;  Location: [facility] ENDOSCOPY;  Service: Gastroenterology;  Laterality: N/A;   INCISION AND DRAINAGE PERIRECTAL ABSCESS N/A 02/04/2015   Procedure: IRRIGATION AND DEBRIDEMENT PERIRECTAL ABSCESS;  Surgeon: Marlyce Huge, MD;  Location: [facility] ORS;  Service: General;  Laterality: N/A;   INSERTION OF MESH  01/20/2022   Procedure: INSERTION OF MESH;  Surgeon: Jules Husbands, MD;  Location: [facility] ORS;  Service: General;;   KIDNEY STONE SURGERY Right    lung mass removal N/A    ORIF ANKLE FRACTURE Left 03/23/2017   Procedure: OPEN REDUCTION INTERNAL FIXATION (ORIF) ANKLE FRACTURE;  Surgeon: Leim Fabry, MD;  Location: [facility] ORS;  Service: Orthopedics;  Laterality: Left;   RECTAL EXAM UNDER ANESTHESIA  02/04/2015   Procedure: RECTAL EXAM UNDER ANESTHESIA;  Surgeon: Marlyce Huge, MD;  Location: [facility] ORS;  Service: General;;   SYNDESMOSIS REPAIR Left 03/23/2017   Procedure: SYNDESMOSIS REPAIR;  Surgeon: Leim Fabry, MD;  Location: [facility] ORS;  Service: Orthopedics;  Laterality: Left;   UMBILICAL HERNIA REPAIR  01/20/2022   Procedure: HERNIA REPAIR UMBILICAL ADULT;  Surgeon: Jules Husbands, MD;  Location: [facility] ORS;  Service: General;;   XI ROBOTIC ASSISTED  PARAESOPHAGEAL HERNIA REPAIR N/A 01/20/2022   Procedure: XI ROBOTIC ASSISTED PARAESOPHAGEAL HERNIA REPAIR, CONVERTED TO OPEN, RNFA to assist;  Surgeon: Jules Husbands, MD;  Location: [facility] ORS;  Service: General;  Laterality: N/A;   Family History  Problem Relation Age of Onset   Cancer Mother 42       Lung   Cancer Father        Colon   Heart disease Father    Alcohol abuse Father    Cancer Brother 24       Esophageal   Diabetes Brother    Heart disease Brother    Social History   Socioeconomic History   Marital status: Single    Spouse name: Not on file   Number of children: Not on file   Years of education: Not on file   Highest education level: Not on file  Occupational History   Not on file  Tobacco Use   Smoking status: Every Day  Packs/day: 0.50    Years: 34.00    Additional pack years: 0.00    Total pack years: 17.00    Types: Cigarettes   Smokeless tobacco: Never   Tobacco comments:    patient using Nicoderm patches  Vaping Use   Vaping Use: Never used  Substance and Sexual Activity   Alcohol use: Not Currently    Alcohol/week: 12.0 standard drinks of alcohol    Types: 12 Cans of beer per week    Comment: Quit February 2023   Drug use: No   Sexual activity: Yes  Other Topics Concern   Not on file  Social History Narrative   Not on file   Social Determinants of Health   Financial Resource Strain: Low Risk  (06/01/2022)   Overall Financial Resource Strain (CARDIA)    Difficulty of Paying Living Expenses: Not hard at all  Food Insecurity: No Food Insecurity (06/01/2022)   Hunger Vital Sign    Worried About Running Out of Food in the Last Year: Never true    Ran Out of Food in the Last Year: Never true  Transportation Needs: No Transportation Needs (06/01/2022)   PRAPARE - Hydrologist (Medical): No    Lack of Transportation (Non-Medical): No  Physical Activity: Insufficiently Active (06/01/2022)   Exercise Vital Sign     Days of Exercise per Week: 2 days    Minutes of Exercise per Session: 20 min  Stress: No Stress Concern Present (06/01/2022)   [place]    Feeling of Stress : Not at all  Social Connections: Socially Isolated (06/01/2022)   Social Connection and Isolation Panel [NHANES]    Frequency of Communication with Friends and Family: Twice a week    Frequency of Social Gatherings with Friends and Family: Never    Attends Religious Services: Never    Marine scientist or Organizations: No    Attends Music therapist: Never    Marital Status: Divorced    Tobacco Counseling Ready to quit: Not Answered Counseling given: Not Answered Tobacco comments: patient using Nicoderm patches   Clinical Intake:  Pre-visit preparation completed: Yes  Pain : 0-10 Pain Score: 3  Pain Type: Chronic pain Pain Location: Back Pain Orientation: Lower     Nutritional Risks: None Diabetes: Yes CBG done?: No CBG resulted in Enter/ Edit results?: No Did pt. bring in CBG monitor from home?: No  How often do you need to have someone help you when you read instructions, pamphlets, or other written materials from your doctor or pharmacy?: 1 - Never  Diabetic?yes Nutrition Risk Assessment:  Has the patient had any N/V/D within the last 2 months?  No  Does the patient have any non-healing wounds?  No  Has the patient had any unintentional weight loss or weight gain?  No   Diabetes:  Is the patient diabetic?  Yes  If diabetic, was a CBG obtained today?  No  Did the patient bring in their glucometer from home?  No  How often do you monitor your CBG's? Several x/ day.   Financial Strains and Diabetes Management:  Are you having any financial strains with the device, your supplies or your medication? No .  Does the patient want to be seen by Chronic Care Management for management of their diabetes?  No  Would the patient  like to be referred to a Nutritionist or for Diabetic Management?  No  Diabetic Exams:  Diabetic Eye Exam: Completed 12/22/21.  Pt has been advised about the importance in completing this exam.  Diabetic Foot Exam: Completed 10/23/21. Pt has been advised about the importance in completing this exam.   Interpreter Needed?: No  Information entered by :: Kirke Shaggy, LPN   Activities of Daily Living    06/01/2022    2:33 PM 06/01/2022    2:17 PM  In your present state of health, do you have any difficulty performing the following activities:  Hearing? 0 0  Vision? 0 0  Difficulty concentrating or making decisions? 0 0  Walking or climbing stairs? 0 0  Dressing or bathing? 0 0  Doing errands, shopping? 0 0  Preparing Food and eating ? N N  Using the Toilet? N N  In the past six months, have you accidently leaked urine? N N  Do you have problems with loss of bowel control? N N  Managing your Medications? N N  Managing your Finances? Tempie Donning  Housekeeping or managing your Housekeeping? N N    Patient Care Team: Jon Billings, NP as PCP - General (Nurse Practitioner) Milinda Pointer, MD as Consulting Physician (Pain Medicine)  Indicate any recent Medical Services you may have received from other than [facility] providers in the past year (date may be approximate).     Assessment:   This is a routine wellness examination for Jawon.  Hearing/Vision screen Hearing Screening - Comments:: No aids Vision Screening - Comments:: Wears glasses= Dr. Ellin Mayhew  Dietary issues and exercise activities discussed: Current Exercise Habits: Home exercise routine, Type of exercise: walking, Time (Minutes): 20, Frequency (Times/Week): 2, Weekly Exercise (Minutes/Week): 40, Intensity: Mild   Goals Addressed             This Visit's Progress    DIET - EAT MORE FRUITS AND VEGETABLES         Depression Screen    06/01/2022    2:30 PM 05/26/2022    9:54 AM 05/22/2022    9:43 AM 04/20/2022    11:38 AM 02/20/2022    9:43 AM 02/04/2022   11:03 AM 01/28/2022    4:15 PM  PHQ 2/9 Scores  PHQ - 2 Score 0 0 0 0 0 2 2  PHQ- 9 Score 0  3  0 4 7    Fall Risk    06/01/2022    2:32 PM 05/26/2022    9:54 AM 05/22/2022    9:43 AM 05/18/2022    2:19 PM 04/29/2022    8:44 AM  Fall Risk   Falls in the past year? 0 0 0 0 0  Number falls in past yr: 0  0  0  Injury with Fall? 0  0  0  Risk for fall due to : No Fall Risks  No Fall Risks  No Fall Risks  Follow up Falls prevention discussed;Falls evaluation completed  Falls evaluation completed  Falls evaluation completed    FALL RISK PREVENTION PERTAINING TO THE HOME:  Any stairs in or around the home? Yes  If so, are there any without handrails? No  Home free of loose throw rugs in walkways, pet beds, electrical cords, etc? Yes  Adequate lighting in your home to reduce risk of falls? Yes   ASSISTIVE DEVICES UTILIZED TO PREVENT FALLS:  Life alert? No  Use of a cane, walker or w/c? Yes - cane  Grab bars in the bathroom? No  Shower chair or bench in shower? No  Elevated  toilet seat or a handicapped toilet? No     Cognitive Function:        06/01/2022    2:42 PM  6CIT Screen  What Year? 0 points  What month? 0 points  What time? 0 points  Count back from 20 0 points  Months in reverse 0 points  Repeat phrase 0 points  Total Score 0 points    Immunizations Immunization History  Administered Date(s) Administered   Influenza Inj Mdck Quad Pf 01/21/2021   Influenza,inj,Quad PF,6+ Mos 12/18/2014, 11/22/2015, 11/29/2017, 11/17/2018, 12/05/2019, 02/20/2022   Influenza-Unspecified 02/15/2012, 12/18/2014, 01/14/2017   Moderna Sars-Covid-2 Vaccination 11/20/2019, 12/22/2019   Pneumococcal Conjugate-13 08/21/2014   Pneumococcal Polysaccharide-23 10/15/2011   Smallpox 06/21/1969   Tdap 11/22/2015, 12/10/2016   Zoster Recombinat (Shingrix) 01/28/2021    TDAP status: Due, Education has been provided regarding the importance of  this vaccine. Advised may receive this vaccine at local pharmacy or Health Dept. Aware to provide a copy of the vaccination record if obtained from local pharmacy or Health Dept. Verbalized acceptance and understanding.  Flu Vaccine status: Up to date  Pneumococcal vaccine status: Up to date  Covid-19 vaccine status: Completed vaccines  Qualifies for Shingles Vaccine? Yes   Zostavax completed No   Shingrix Completed?: No.    Education has been provided regarding the importance of this vaccine. Patient has been advised to call insurance company to determine out of pocket expense if they have not yet received this vaccine. Advised may also receive vaccine at local pharmacy or Health Dept. Verbalized acceptance and understanding.  Screening Tests Health Maintenance  Topic Date Due   COVID-19 Vaccine (3 - Moderna risk series) 01/19/2020   Zoster Vaccines- Shingrix (2 of 2) 03/25/2021   HEMOGLOBIN A1C  08/22/2022   FOOT EXAM  10/24/2022   OPHTHALMOLOGY EXAM  12/23/2022   Diabetic kidney evaluation - Urine ACR  05/02/2023   Diabetic kidney evaluation - eGFR measurement  05/22/2023   Medicare Annual Wellness (AWV)  06/01/2023   COLONOSCOPY (Pts 45-64yrs Insurance coverage will need to be confirmed)  12/31/2024   DTaP/Tdap/Td (3 - Td or Tdap) 12/11/2026   INFLUENZA VACCINE  Completed   Hepatitis C Screening  Completed   HIV Screening  Completed   HPV VACCINES  Aged Out   Lung Cancer Screening  Discontinued    Health Maintenance  Health Maintenance Due  Topic Date Due   COVID-19 Vaccine (3 - Moderna risk series) 01/19/2020   Zoster Vaccines- Shingrix (2 of 2) 03/25/2021  Had first Shingrix  Colorectal cancer screening: Type of screening: Colonoscopy. Completed 12/31/21. Repeat every 3 years  Lung Cancer Screening: (Low Dose CT Chest recommended if Age 66-80 years, 30 pack-year currently smoking OR have quit w/in 15years.) does qualify.   Lung Cancer Screening Referral: Had one on  02/16/22  Additional Screening:  Hepatitis C Screening: does qualify; Completed 10/23/21  Vision Screening: Recommended annual ophthalmology exams for early detection of glaucoma and other disorders of the eye. Is the patient up to date with their annual eye exam?  Yes  Who is the provider or what is the name of the office in which the patient attends annual eye exams? Dr.Woodard If pt is not established with a provider, would they like to be referred to a provider to establish care? No .   Dental Screening: Recommended annual dental exams for proper oral hygiene  Community Resource Referral / Chronic Care Management: CRR required this visit?  No   CCM required  this visit?  No      Plan:     I have personally reviewed and noted the following in the patient's chart:   Medical and social history Use of alcohol, tobacco or illicit drugs  Current medications and supplements including opioid prescriptions. Patient is currently taking opioid prescriptions. Information provided to patient regarding non-opioid alternatives. Patient advised to discuss non-opioid treatment plan with their provider. Functional ability and status Nutritional status Physical activity Advanced directives List of other physicians Hospitalizations, surgeries, and ER visits in previous 12 months Vitals Screenings to include cognitive, depression, and falls Referrals and appointments  In addition, I have reviewed and discussed with patient certain preventive protocols, quality metrics, and best practice recommendations. A written personalized care plan for preventive services as well as general preventive health recommendations were provided to patient.     Dionisio David, LPN   [id number]   Nurse Notes: none

## 2022-06-01 NOTE — Patient Instructions (Signed)
Mr. Routt , Thank you for taking time to come for your Medicare Wellness Visit. I appreciate your ongoing commitment to your health goals. Please review the following plan we discussed and let me know if I can assist you in the future.   These are the goals we discussed:  Goals      DIET - EAT MORE FRUITS AND VEGETABLES        This is a list of the screening recommended for you and due dates:  Health Maintenance  Topic Date Due   COVID-19 Vaccine (3 - Moderna risk series) 01/19/2020   Zoster (Shingles) Vaccine (2 of 2) 03/25/2021   Hemoglobin A1C  08/22/2022   Complete foot exam   10/24/2022   Eye exam for diabetics  12/23/2022   Yearly kidney health urinalysis for diabetes  05/02/2023   Yearly kidney function blood test for diabetes  05/22/2023   Medicare Annual Wellness Visit  06/01/2023   Colon Cancer Screening  12/31/2024   DTaP/Tdap/Td vaccine (3 - Td or Tdap) 12/11/2026   Flu Shot  Completed   Hepatitis C Screening: USPSTF Recommendation to screen - Ages 18-79 yo.  Completed   HIV Screening  Completed   HPV Vaccine  Aged Out   Screening for Lung Cancer  Discontinued    Advanced directives: no  Conditions/risks identified: none  Next appointment: Follow up in one year for your annual wellness visit 06/07/23 @ 1:30 pm by phone  Preventive Care 40-64 Years, Male Preventive care refers to lifestyle choices and visits with your health care provider that can promote health and wellness. What does preventive care include? A yearly physical exam. This is also called an annual well check. Dental exams once or twice a year. Routine eye exams. Ask your health care provider how often you should have your eyes checked. Personal lifestyle choices, including: Daily care of your teeth and gums. Regular physical activity. Eating a healthy diet. Avoiding tobacco and drug use. Limiting alcohol use. Practicing safe sex. Taking low-dose aspirin every day starting at age 61. What  happens during an annual well check? The services and screenings done by your health care provider during your annual well check will depend on your age, overall health, lifestyle risk factors, and family history of disease. Counseling  Your health care provider may ask you questions about your: Alcohol use. Tobacco use. Drug use. Emotional well-being. Home and relationship well-being. Sexual activity. Eating habits. Work and work Statistician. Screening  You may have the following tests or measurements: Height, weight, and BMI. Blood pressure. Lipid and cholesterol levels. These may be checked every 5 years, or more frequently if you are over 56 years old. Skin check. Lung cancer screening. You may have this screening every year starting at age 7 if you have a 30-pack-year history of smoking and currently smoke or have quit within the past 15 years. Fecal occult blood test (FOBT) of the stool. You may have this test every year starting at age 20. Flexible sigmoidoscopy or colonoscopy. You may have a sigmoidoscopy every 5 years or a colonoscopy every 10 years starting at age 81. Prostate cancer screening. Recommendations will vary depending on your family history and other risks. Hepatitis C blood test. Hepatitis B blood test. Sexually transmitted disease (STD) testing. Diabetes screening. This is done by checking your blood sugar (glucose) after you have not eaten for a while (fasting). You may have this done every 1-3 years. Discuss your test results, treatment options, and if necessary,  the need for more tests with your health care provider. Vaccines  Your health care provider may recommend certain vaccines, such as: Influenza vaccine. This is recommended every year. Tetanus, diphtheria, and acellular pertussis (Tdap, Td) vaccine. You may need a Td booster every 10 years. Zoster vaccine. You may need this after age 33. Pneumococcal 13-valent conjugate (PCV13) vaccine. You may need  this if you have certain conditions and have not been vaccinated. Pneumococcal polysaccharide (PPSV23) vaccine. You may need one or two doses if you smoke cigarettes or if you have certain conditions. Talk to your health care provider about which screenings and vaccines you need and how often you need them. This information is not intended to replace advice given to you by your health care provider. Make sure you discuss any questions you have with your health care provider. Document Released: 03/29/2015 Document Revised: 11/20/2015 Document Reviewed: 01/01/2015 Elsevier Interactive Patient Education  2017 [place] Prevention in the Home Falls can cause injuries. They can happen to people of all ages. There are many things you can do to make your home safe and to help prevent falls. What can I do on the outside of my home? Regularly fix the edges of walkways and driveways and fix any cracks. Remove anything that might make you trip as you walk through a door, such as a raised step or threshold. Trim any bushes or trees on the path to your home. Use bright outdoor lighting. Clear any walking paths of anything that might make someone trip, such as rocks or tools. Regularly check to see if handrails are loose or broken. Make sure that both sides of any steps have handrails. Any raised decks and porches should have guardrails on the edges. Have any leaves, snow, or ice cleared regularly. Use sand or salt on walking paths during winter. Clean up any spills in your garage right away. This includes oil or grease spills. What can I do in the bathroom? Use night lights. Install grab bars by the toilet and in the tub and shower. Do not use towel bars as grab bars. Use non-skid mats or decals in the tub or shower. If you need to sit down in the shower, use a plastic, non-slip stool. Keep the floor dry. Clean up any water that spills on the floor as soon as it happens. Remove soap buildup  in the tub or shower regularly. Attach bath mats securely with double-sided non-slip rug tape. Do not have throw rugs and other things on the floor that can make you trip. What can I do in the bedroom? Use night lights. Make sure that you have a light by your bed that is easy to reach. Do not use any sheets or blankets that are too big for your bed. They should not hang down onto the floor. Have a firm chair that has side arms. You can use this for support while you get dressed. Do not have throw rugs and other things on the floor that can make you trip. What can I do in the kitchen? Clean up any spills right away. Avoid walking on wet floors. Keep items that you use a lot in easy-to-reach places. If you need to reach something above you, use a strong step stool that has a grab bar. Keep electrical cords out of the way. Do not use floor polish or wax that makes floors slippery. If you must use wax, use non-skid floor wax. Do not have throw rugs and other  things on the floor that can make you trip. What can I do with my stairs? Do not leave any items on the stairs. Make sure that there are handrails on both sides of the stairs and use them. Fix handrails that are broken or loose. Make sure that handrails are as long as the stairways. Check any carpeting to make sure that it is firmly attached to the stairs. Fix any carpet that is loose or worn. Avoid having throw rugs at the top or bottom of the stairs. If you do have throw rugs, attach them to the floor with carpet tape. Make sure that you have a light switch at the top of the stairs and the bottom of the stairs. If you do not have them, ask someone to add them for you. What else can I do to help prevent falls? Wear shoes that: Do not have high heels. Have rubber bottoms. Are comfortable and fit you well. Are closed at the toe. Do not wear sandals. If you use a stepladder: Make sure that it is fully opened. Do not climb a closed  stepladder. Make sure that both sides of the stepladder are locked into place. Ask someone to hold it for you, if possible. Clearly mark and make sure that you can see: Any grab bars or handrails. First and last steps. Where the edge of each step is. Use tools that help you move around (mobility aids) if they are needed. These include: Canes. Walkers. Scooters. Crutches. Turn on the lights when you go into a dark area. Replace any light bulbs as soon as they burn out. Set up your furniture so you have a clear path. Avoid moving your furniture around. If any of your floors are uneven, fix them. If there are any pets around you, be aware of where they are. Review your medicines with your doctor. Some medicines can make you feel dizzy. This can increase your chance of falling. Ask your doctor what other things that you can do to help prevent falls. This information is not intended to replace advice given to you by your health care provider. Make sure you discuss any questions you have with your health care provider. Document Released: 12/27/2008 Document Revised: 08/08/2015 Document Reviewed: 04/06/2014 Elsevier Interactive Patient Education  2017 [employer].

## 2022-06-03 ENCOUNTER — Ambulatory Visit: Payer: 59 | Admitting: Surgery

## 2022-06-03 ENCOUNTER — Encounter: Payer: Self-pay | Admitting: Nurse Practitioner

## 2022-06-07 NOTE — Progress Notes (Unsigned)
PROVIDER NOTE: Information contained herein reflects review and annotations entered in association with encounter. Interpretation of such information and data should be left to medically-trained personnel. Information provided to patient can be located elsewhere in the medical record under "Patient Instructions". Document created using STT-dictation technology, any transcriptional errors that may result from process are unintentional.    Patient: Darren Allen  Service Category: E/M  Provider: Gaspar Cola, MD  DOB: [birth year]  DOS: 06/09/2022  Referring Provider: Jon Billings, NP  MRN: [id number]  Specialty: Interventional Pain Management  PCP: Jon Billings, NP  Type: Established Patient  Setting: Ambulatory outpatient    Location: Office  Delivery: Face-to-face     HPI  Mr. NEILSON MELDE, a 57 y.o. year old male, is here today because of his No primary diagnosis found.. Mr. Costella primary complain today is No chief complaint on file.  Pertinent problems: Mr. Cirrito has Ankle fracture; Neuropathy; Diabetic peripheral neuropathy ([place]); Gout; Chronic ankle pain (Bilateral); Chronic low back pain (1ry area of Pain) (Bilateral) (R>L) w/o sciatica; Other acquired hammer toe; Chronic pain syndrome; Lumbar facet syndrome; Lumbosacral radiculopathy at S1 (Left); Chronic feet pain (2ry area of Pain) (Bilateral); Chronic ankle pain (Left); Abnormal NCS (nerve conduction studies) (02/20/2020); Abnormal MRI, lumbar spine (03/11/2020); DDD (degenerative disc disease), lumbosacral; Lumbosacral lateral recess stenosis (Left: L5-S1); and Chronic low back pain (Bilateral) w/ sciatica (Left) on their pertinent problem list. Pain Assessment: Severity of   is reported as a  /10. Location:    / . Onset:  . Quality:  . Timing:  . Modifying factor(s):  Marland Kitchen Vitals:  vitals were not taken for this visit.  BMI: Estimated body mass index is 29.92 kg/m as calculated from the following:   Height as of  06/01/22: 6\' 2"  (1.88 m).   Weight as of 06/01/22: 233 lb (105.7 kg). Last encounter: 05/18/2022. Last procedure: 05/26/2022.  Reason for encounter:  *** . ***  Pharmacotherapy Assessment  Analgesic: Hydrocodone/APAP 5/325 tablet, 1 tab p.o. twice daily (#60) MME/day: 10 mg/day   Monitoring: [place] PMP: PDMP reviewed during this encounter.       Pharmacotherapy: No side-effects or adverse reactions reported. Compliance: No problems identified. Effectiveness: Clinically acceptable.  No notes on file  No results found for: "CBDTHCR" No results found for: "D8THCCBX" No results found for: "D9THCCBX"  UDS:  Summary  Date Value Ref Range Status  09/08/2021 Note  Final    Comment:    ==================================================================== Compliance Drug Analysis, Ur ==================================================================== Test                             Result       Flag       Units  Drug Present and Declared for Prescription Verification   Hydrocodone                    697          EXPECTED   ng/mg creat   Dihydrocodeine                 36           EXPECTED   ng/mg creat   Norhydrocodone                 1265         EXPECTED   ng/mg creat    Sources of hydrocodone include scheduled prescription medications.    Dihydrocodeine  and norhydrocodone are expected metabolites of    hydrocodone. Dihydrocodeine is also available as a scheduled    prescription medication.    Gabapentin                     PRESENT      EXPECTED   Amitriptyline                  PRESENT      EXPECTED   Nortriptyline                  PRESENT      EXPECTED    Nortriptyline is an expected metabolite of amitriptyline.    Acetaminophen                  PRESENT      EXPECTED  Drug Absent but Declared for Prescription Verification   Metoprolol                     Not Detected UNEXPECTED ==================================================================== Test                      Result     Flag   Units      Ref Range   Creatinine              284              mg/dL      >=20 ==================================================================== Declared Medications:  The flagging and interpretation on this report are based on the  following declared medications.  Unexpected results may arise from  inaccuracies in the declared medications.   **Note: The testing scope of this panel includes these medications:   Amitriptyline (Elavil)  Gabapentin (Neurontin)  Hydrocodone (Norco)  Metoprolol   **Note: The testing scope of this panel does not include small to  moderate amounts of these reported medications:   Acetaminophen (Tylenol)  Acetaminophen (Norco)   **Note: The testing scope of this panel does not include the  following reported medications:   Albuterol (Proair HFA)  Fluticasone (Flonase)  Fluticasone (Trelegy)  Hydrochlorothiazide  Insulin (Humulin)  Iron  Levalbuterol (Xopenex)  Lisinopril (Zestril)  Metformin  Montelukast  Pantoprazole (Protonix)  Rosuvastatin (Crestor)  Testosterone  Triamcinolone (Kenalog)  Umeclidinium (Trelegy)  Vilanterol (Trelegy)  Vitamin C ==================================================================== For clinical consultation, please call [phone]. ====================================================================       ROS  Constitutional: Denies any fever or chills Gastrointestinal: No reported hemesis, hematochezia, vomiting, or acute GI distress Musculoskeletal: Denies any acute onset joint swelling, redness, loss of ROM, or weakness Neurological: No reported episodes of acute onset apraxia, aphasia, dysarthria, agnosia, amnesia, paralysis, loss of coordination, or loss of consciousness  Medication Review  Dulaglutide, Fluticasone-Umeclidin-Vilant, HYDROcodone-acetaminophen, acetaminophen, albuterol, amitriptyline, ascorbic acid, ferrous sulfate, fluticasone, gabapentin, hydrochlorothiazide, insulin  isophane & regular human KwikPen, lisinopril, meloxicam, metFORMIN, montelukast, naloxone, ondansetron, rosuvastatin, tadalafil, testosterone cypionate, and triamcinolone cream  History Review  Allergy: Mr. Tatom is allergic to glipizide, cephalexin, and duloxetine. Drug: Mr. Arntzen  reports no history of drug use. Alcohol:  reports that he does not currently use alcohol after a past usage of about 12.0 standard drinks of alcohol per week. Tobacco:  reports that he has been smoking cigarettes. He has a 17.00 pack-year smoking history. He has never used smokeless tobacco. Social: Mr. Pedder  reports that he has been smoking cigarettes. He has a 17.00 pack-year smoking history. He has never used smokeless tobacco. He  reports that he does not currently use alcohol after a past usage of about 12.0 standard drinks of alcohol per week. He reports that he does not use drugs. Medical:  has a past medical history of Allergy, Calculus of kidney (02/04/2015), COPD (chronic obstructive pulmonary disease) ([place]), Diabetes mellitus without complication ([place]), Hypertension, and Neuropathy. Surgical: Mr. Brister  has a past surgical history that includes Appendectomy; Incision and drainage perirectal abscess (N/A, 02/04/2015); Rectal exam under anesthesia (02/04/2015); ORIF ankle fracture (Left, 03/23/2017); Syndesmosis repair (Left, 03/23/2017); Colonoscopy with propofol (N/A, 12/31/2021); Esophagogastroduodenoscopy (N/A, 12/31/2021); Kidney stone surgery (Right); lung mass removal (N/A); Xi robotic assisted paraesophageal hernia repair (N/A, 01/20/2022); Insertion of mesh ([id number]); and Umbilical hernia repair (01/20/2022). Family: family history includes Alcohol abuse in his father; Cancer in his father; Cancer (age of onset: 49) in his brother; Cancer (age of onset: 44) in his mother; Diabetes in his brother; Heart disease in his brother and father.  Laboratory Chemistry Profile   Renal Lab Results  Component  Value Date   BUN 16 05/22/2022   CREATININE 1.22 05/22/2022   BCR 13 05/22/2022   GFRAA 59 (L) 12/07/2019   GFRNONAA >60 02/15/2022    Hepatic Lab Results  Component Value Date   AST 17 05/22/2022   ALT 21 05/22/2022   ALBUMIN 4.5 05/22/2022   ALKPHOS 84 05/22/2022   AMYLASE 93 02/04/2022   LIPASE 115 (H) 02/04/2022    Electrolytes Lab Results  Component Value Date   NA 137 05/22/2022   K 4.3 05/22/2022   CL 99 05/22/2022   CALCIUM 9.9 05/22/2022   MG 1.8 01/25/2022   PHOS 2.3 (L) 01/23/2022    Bone Lab Results  Component Value Date   25OHVITD1 44 09/08/2021   25OHVITD2 <1.0 09/08/2021   25OHVITD3 43 09/08/2021   TESTOSTERONE 559 03/23/2022    Inflammation (CRP: Acute Phase) (ESR: Chronic Phase) Lab Results  Component Value Date   CRP 1.8 (H) 09/08/2021   ESRSEDRATE 17 09/08/2021   LATICACIDVEN 1.6 02/03/2022         Note: Above Lab results reviewed.  Recent Imaging Review  DG PAIN CLINIC C-ARM 1-60 MIN NO REPORT Fluoro was used, but no Radiologist interpretation will be provided.  Please refer to "NOTES" tab for provider progress note. Note: Reviewed        Physical Exam  General appearance: Well nourished, well developed, and well hydrated. In no apparent acute distress Mental status: Alert, oriented x 3 (person, place, & time)       Respiratory: No evidence of acute respiratory distress Eyes: PERLA Vitals: There were no vitals taken for this visit. BMI: Estimated body mass index is 29.92 kg/m as calculated from the following:   Height as of 06/01/22: 6\' 2"  (1.88 m).   Weight as of 06/01/22: 233 lb (105.7 kg). Ideal: Ideal body weight: 82.2 kg (181 lb 3.5 oz) Adjusted ideal body weight: 91.6 kg (201 lb 14.9 oz)  Assessment   Diagnosis Status  No diagnosis found. Controlled Controlled Controlled   Updated Problems: No problems updated.  Plan of Care  Problem-specific:  No problem-specific Assessment & Plan notes found for this  encounter.  Mr. CAROLE GRANILLO has a current medication list which includes the following long-term medication(s): ferrous sulfate, fluticasone, hydrochlorothiazide, hydrocodone-acetaminophen, hydrocodone-acetaminophen, [START ON 06/26/2022] hydrocodone-acetaminophen, humulin 70/30 kwikpen, lisinopril, metformin, montelukast, rosuvastatin, tadalafil, and testosterone cypionate.  Pharmacotherapy (Medications Ordered): No orders of the defined types were placed in this encounter.  Orders:  No orders  of the defined types were placed in this encounter.  Follow-up plan:   No follow-ups on file.      Interventional Therapies  Risk Factors  Considerations:   WNL   Planned  Pending:   Therapeutic left L5-S1 LESI #2 (05/26/2022)    Under consideration:   Diagnostic bilateral lumbar facet MBB #1  Diagnostic/therapeutic left L5 & S1 TFESI #1  Possible spinal cord stimulator trial  Therapeutic left L5-S1 percutaneous discectomy with "[employer] Dekompressor" system    Completed:   Diagnostic midline to left caudal ESI x1 (04/02/2022) (100/100/100/LBP:100/LEP:100)  Diagnostic left L5-S1 LESI x1 (12/16/2021) (100/100/100/LBP:85  LEP:100)  Therapeutic bilateral Qutenza neurolytic treatment x1 (12/23/2021)  (09/08/2021 & 10/29/2021) referral to physical therapy for evaluation and treatment of low back pain.   Completed by other providers:   EMG/PNCV of lower extremity (02/20/2020) by Dr. Jennings Books (generalized sensorimotor peripheral neuropathy; superimposed left S1 radiculopathy)   Therapeutic  Palliative (PRN) options:   None established        Recent Visits Date Type Provider Dept  05/26/22 Procedure visit Milinda Pointer, MD [facility]  05/18/22 Office Visit Milinda Pointer, MD [facility]  04/20/22 Office Visit Milinda Pointer, MD [facility]  04/02/22 Procedure visit Milinda Pointer, MD [facility]  Showing recent visits within  past 90 days and meeting all other requirements Future Appointments Date Type Provider Dept  06/09/22 Appointment Milinda Pointer, [facility]  07/22/22 Appointment Milinda Pointer, MD [facility]  Showing future appointments within next 90 days and meeting all other requirements  I discussed the assessment and treatment plan with the patient. The patient was provided an opportunity to ask questions and all were answered. The patient agreed with the plan and demonstrated an understanding of the instructions.  Patient advised to call back or seek an in-person evaluation if the symptoms or condition worsens.  Duration of encounter: *** minutes.  Total time on encounter, as per AMA guidelines included both the face-to-face and non-face-to-face time personally spent by the physician and/or other qualified health care professional(s) on the day of the encounter (includes time in activities that require the physician or other qualified health care professional and does not include time in activities normally performed by clinical staff). Physician's time may include the following activities when performed: Preparing to see the patient (e.g., pre-charting review of records, searching for previously ordered imaging, lab work, and nerve conduction tests) Review of prior analgesic pharmacotherapies. Reviewing PMP Interpreting ordered tests (e.g., lab work, imaging, nerve conduction tests) Performing post-procedure evaluations, including interpretation of diagnostic procedures Obtaining and/or reviewing separately obtained history Performing a medically appropriate examination and/or evaluation Counseling and educating the patient/family/caregiver Ordering medications, tests, or procedures Referring and communicating with other health care professionals (when not separately reported) Documenting clinical information in the electronic or other health record Independently  interpreting results (not separately reported) and communicating results to the patient/ family/caregiver Care coordination (not separately reported)  Note by: Gaspar Cola, MD Date: 06/09/2022; Time: 6:42 PM

## 2022-06-08 ENCOUNTER — Other Ambulatory Visit: Payer: Self-pay | Admitting: Nurse Practitioner

## 2022-06-09 ENCOUNTER — Ambulatory Visit: Payer: 59 | Attending: Pain Medicine | Admitting: Pain Medicine

## 2022-06-09 ENCOUNTER — Encounter: Payer: Self-pay | Admitting: Pain Medicine

## 2022-06-09 VITALS — BP 103/71 | HR 103 | Temp 98.3°F | Ht 74.0 in | Wt 232.0 lb

## 2022-06-09 DIAGNOSIS — M5137 Other intervertebral disc degeneration, lumbosacral region: Secondary | ICD-10-CM | POA: Diagnosis present

## 2022-06-09 DIAGNOSIS — R94130 Abnormal response to nerve stimulation, unspecified: Secondary | ICD-10-CM | POA: Insufficient documentation

## 2022-06-09 DIAGNOSIS — M47816 Spondylosis without myelopathy or radiculopathy, lumbar region: Secondary | ICD-10-CM | POA: Insufficient documentation

## 2022-06-09 DIAGNOSIS — Z79891 Long term (current) use of opiate analgesic: Secondary | ICD-10-CM | POA: Diagnosis present

## 2022-06-09 DIAGNOSIS — R937 Abnormal findings on diagnostic imaging of other parts of musculoskeletal system: Secondary | ICD-10-CM | POA: Diagnosis present

## 2022-06-09 DIAGNOSIS — M5417 Radiculopathy, lumbosacral region: Secondary | ICD-10-CM | POA: Diagnosis present

## 2022-06-09 DIAGNOSIS — G894 Chronic pain syndrome: Secondary | ICD-10-CM | POA: Diagnosis present

## 2022-06-09 DIAGNOSIS — M545 Low back pain, unspecified: Secondary | ICD-10-CM | POA: Diagnosis not present

## 2022-06-09 DIAGNOSIS — G8929 Other chronic pain: Secondary | ICD-10-CM | POA: Insufficient documentation

## 2022-06-09 DIAGNOSIS — Z79899 Other long term (current) drug therapy: Secondary | ICD-10-CM | POA: Diagnosis present

## 2022-06-09 DIAGNOSIS — M5442 Lumbago with sciatica, left side: Secondary | ICD-10-CM | POA: Diagnosis present

## 2022-06-09 MED ORDER — HYDROCODONE-ACETAMINOPHEN 5-325 MG PO TABS: 1.0000 | ORAL_TABLET | Freq: Two times a day (BID) | ORAL | 0 refills | Status: DC | PRN

## 2022-06-09 NOTE — Progress Notes (Signed)
Safety precautions to be maintained throughout the outpatient stay will include: orient to surroundings, keep bed in low position, maintain call bell within reach at all times, provide assistance with transfer out of bed and ambulation.  

## 2022-06-09 NOTE — Patient Instructions (Signed)
  ____________________________________________________________________________________________  Patient Information update  To: All of our patients.  Re: Name change.  It has been made official that our current name, "[facility]"   will soon be changed to "[facility]".   The purpose of this change is to eliminate any confusion created by the concept of our practice being a "Medication Management Pain Clinic". In the past this has led to the misconception that we treat pain primarily by the use of prescription medications.  Nothing can be farther from the truth.   Understanding PAIN MANAGEMENT: To further understand what our practice does, you first have to understand that "Pain Management" is a subspecialty that requires additional training once a physician has completed their specialty training, which can be in either Anesthesia, Neurology, Psychiatry, or Physical Medicine and Rehabilitation (PMR). Each one of these contributes to the final approach taken by each physician to the management of their patient's pain. To be a "Pain Management Specialist" you must have first completed one of the specialty trainings below.  Anesthesiologists - trained in clinical pharmacology and interventional techniques such as nerve blockade and regional as well as central neuroanatomy. They are trained to block pain before, during, and after surgical interventions.  Neurologists - trained in the diagnosis and pharmacological treatment of complex neurological conditions, such as Multiple Sclerosis, Parkinson's, spinal cord injuries, and other systemic conditions that may be associated with symptoms that may include but are not limited to pain. They tend to rely primarily on the treatment of chronic pain using prescription medications.  Psychiatrist - trained in conditions affecting the psychosocial  wellbeing of patients including but not limited to depression, anxiety, schizophrenia, personality disorders, addiction, and other substance use disorders that may be associated with chronic pain. They tend to rely primarily on the treatment of chronic pain using prescription medications.   Physical Medicine and Rehabilitation (PMR) physicians, also known as physiatrists - trained to treat a wide variety of medical conditions affecting the brain, spinal cord, nerves, bones, joints, ligaments, muscles, and tendons. Their training is primarily aimed at treating patients that have suffered injuries that have caused severe physical impairment. Their training is primarily aimed at the physical therapy and rehabilitation of those patients. They may also work alongside orthopedic surgeons or neurosurgeons using their expertise in assisting surgical patients to recover after their surgeries.  INTERVENTIONAL PAIN MANAGEMENT is sub-subspecialty of Pain Management.  Our physicians are Board-certified in Anesthesia, Pain Management, and Interventional Pain Management.  This meaning that not only have they been trained and Board-certified in their specialty of Anesthesia, and subspecialty of Pain Management, but they have also received further training in the sub-subspecialty of Interventional Pain Management, in order to become Board-certified as INTERVENTIONAL PAIN MANAGEMENT SPECIALIST.    Mission: Our goal is to use our skills in  INTERVENTIONAL PAIN MANAGEMENT as alternatives to the chronic use of prescription opioid medications for the treatment of pain. To make this more clear, we have changed our name to reflect what we do and offer. We will continue to offer medication management assessment and recommendations, but we will not be taking over any patient's medication management.  ____________________________________________________________________________________________     

## 2022-06-11 ENCOUNTER — Telehealth: Payer: Self-pay

## 2022-06-11 ENCOUNTER — Ambulatory Visit (INDEPENDENT_AMBULATORY_CARE_PROVIDER_SITE_OTHER): Payer: 59 | Admitting: Family Medicine

## 2022-06-11 ENCOUNTER — Encounter: Payer: Self-pay | Admitting: Family Medicine

## 2022-06-11 VITALS — BP 111/74 | HR 109 | Temp 98.3°F | Ht 74.0 in | Wt 231.7 lb

## 2022-06-11 DIAGNOSIS — E1140 Type 2 diabetes mellitus with diabetic neuropathy, unspecified: Secondary | ICD-10-CM

## 2022-06-11 DIAGNOSIS — Z0000 Encounter for general adult medical examination without abnormal findings: Secondary | ICD-10-CM

## 2022-06-11 DIAGNOSIS — E1142 Type 2 diabetes mellitus with diabetic polyneuropathy: Secondary | ICD-10-CM

## 2022-06-11 NOTE — Assessment & Plan Note (Signed)
Following regularly with his PCP. Doing well. No concerns. Needs diabetic shoes. Continue to monitor.

## 2022-06-11 NOTE — Progress Notes (Signed)
BP 111/74   Pulse (!) 109   Temp 98.3 F (36.8 C) (Oral)   Ht 6\' 2"  (1.88 m)   Wt 231 lb 11.2 oz (105.1 kg)   SpO2 97%   BMI 29.75 kg/m    Subjective:    Patient ID: Darren Allen, male    DOB: October 19, 1965, 57 y.o.   MRN: [id number]  HPI: Darren Allen is a 57 y.o. male  Chief Complaint  Patient presents with   Diabetes   DIABETES- follows with PCP regularly. Doing well. He notes that he needs shoes. He last got them about 3 years ago. He has known hammer toe and severe neuropathy. Hypoglycemic episodes:no Polydipsia/polyuria: no Visual disturbance: no Chest pain: no Paresthesias: yes Glucose Monitoring: yes Taking Insulin?: yes Blood Pressure Monitoring: daily Retinal Examination: Up to Date Foot Exam: Up to Date Diabetic Education: Completed Pneumovax: Up to Date Influenza: Not up to Date Aspirin: no  Relevant past medical, surgical, family and social history reviewed and updated as indicated. Interim medical history since our last visit reviewed. Allergies and medications reviewed and updated.  Review of Systems  Constitutional: Negative.   Respiratory: Negative.    Cardiovascular: Negative.   Gastrointestinal: Negative.   Musculoskeletal: Negative.   Neurological: Negative.   Psychiatric/Behavioral: Negative.      Per HPI unless specifically indicated above     Objective:    BP 111/74   Pulse (!) 109   Temp 98.3 F (36.8 C) (Oral)   Ht 6\' 2"  (1.88 m)   Wt 231 lb 11.2 oz (105.1 kg)   SpO2 97%   BMI 29.75 kg/m   Wt Readings from Last 3 Encounters:  06/11/22 231 lb 11.2 oz (105.1 kg)  06/09/22 232 lb (105.2 kg)  06/01/22 233 lb (105.7 kg)    Physical Exam Vitals and nursing note reviewed.  Constitutional:      General: He is not in acute distress.    Appearance: Normal appearance. He is not ill-appearing, toxic-appearing or diaphoretic.  HENT:     Head: Normocephalic and atraumatic.     Right Ear: External ear normal.     Left Ear:  External ear normal.     Nose: Nose normal.     Mouth/Throat:     Mouth: Mucous membranes are moist.     Pharynx: Oropharynx is clear.  Eyes:     General: No scleral icterus.       Right eye: No discharge.        Left eye: No discharge.     Extraocular Movements: Extraocular movements intact.     Conjunctiva/sclera: Conjunctivae normal.     Pupils: Pupils are equal, round, and reactive to light.  Cardiovascular:     Rate and Rhythm: Normal rate and regular rhythm.     Pulses: Normal pulses.     Heart sounds: Normal heart sounds. No murmur heard.    No friction rub. No gallop.  Pulmonary:     Effort: Pulmonary effort is normal. No respiratory distress.     Breath sounds: Normal breath sounds. No stridor. No wheezing, rhonchi or rales.  Chest:     Chest wall: No tenderness.  Musculoskeletal:        General: Normal range of motion.     Cervical back: Normal range of motion and neck supple.  Skin:    General: Skin is warm and dry.     Capillary Refill: Capillary refill takes less than 2 seconds.  Coloration: Skin is not jaundiced or pale.     Findings: No bruising, erythema, lesion or rash.  Neurological:     General: No focal deficit present.     Mental Status: He is alert and oriented to person, place, and time. Mental status is at baseline.  Psychiatric:        Mood and Affect: Mood normal.        Behavior: Behavior normal.        Thought Content: Thought content normal.        Judgment: Judgment normal.     Results for orders placed or performed in visit on 05/22/22  Comp Met (CMET)  Result Value Ref Range   Glucose 193 (H) 70 - 99 mg/dL   BUN 16 6 - 24 mg/dL   Creatinine, Ser 1.22 0.76 - 1.27 mg/dL   eGFR 68 >59 mL/min/1.73   BUN/Creatinine Ratio 13 9 - 20   Sodium 137 134 - 144 mmol/L   Potassium 4.3 3.5 - 5.2 mmol/L   Chloride 99 96 - 106 mmol/L   CO2 23 20 - 29 mmol/L   Calcium 9.9 8.7 - 10.2 mg/dL   Total Protein 6.9 6.0 - 8.5 g/dL   Albumin 4.5 3.8 -  4.9 g/dL   Globulin, Total 2.4 1.5 - 4.5 g/dL   Albumin/Globulin Ratio 1.9 1.2 - 2.2   Bilirubin Total 0.4 0.0 - 1.2 mg/dL   Alkaline Phosphatase 84 44 - 121 IU/L   AST 17 0 - 40 IU/L   ALT 21 0 - 44 IU/L  Lipid Profile  Result Value Ref Range   Cholesterol, Total 107 100 - 199 mg/dL   Triglycerides 496 (H) 0 - 149 mg/dL   HDL 25 (L) >39 mg/dL   VLDL Cholesterol Cal 66 (H) 5 - 40 mg/dL   LDL Chol Calc (NIH) 16 0 - 99 mg/dL   Chol/HDL Ratio 4.3 0.0 - 5.0 ratio  CBC w/Diff  Result Value Ref Range   WBC 7.5 3.4 - 10.8 x10E3/uL   RBC 5.31 4.14 - 5.80 x10E6/uL   Hemoglobin 12.0 (L) 13.0 - 17.7 g/dL   Hematocrit 40.1 37.5 - 51.0 %   MCV 76 (L) 79 - 97 fL   MCH 22.6 (L) 26.6 - 33.0 pg   MCHC 29.9 (L) 31.5 - 35.7 g/dL   RDW 18.2 (H) 11.6 - 15.4 %   Platelets 276 150 - 450 x10E3/uL   Neutrophils 63 Not Estab. %   Lymphs 21 Not Estab. %   Monocytes 10 Not Estab. %   Eos 5 Not Estab. %   Basos 1 Not Estab. %   Neutrophils Absolute 4.8 1.4 - 7.0 x10E3/uL   Lymphocytes Absolute 1.6 0.7 - 3.1 x10E3/uL   Monocytes Absolute 0.7 0.1 - 0.9 x10E3/uL   EOS (ABSOLUTE) 0.3 0.0 - 0.4 x10E3/uL   Basophils Absolute 0.1 0.0 - 0.2 x10E3/uL   Immature Granulocytes 0 Not Estab. %   Immature Grans (Abs) 0.0 0.0 - 0.1 x10E3/uL      Assessment & Plan:   Problem List Items Addressed This Visit       Endocrine   Type 2 diabetes mellitus with diabetic neuropathy, without long-term current use of insulin ([place]) - Primary    Following regularly with his PCP. Doing well. No concerns. Needs diabetic shoes. Continue to monitor.         Follow up plan: Return if symptoms worsen or fail to improve.

## 2022-06-11 NOTE — Telephone Encounter (Signed)
-----   Message from Valerie Roys, [place] sent at 06/11/2022 11:14 AM EDT ----- Can we get a shoe order from the "[facility] to the [place]" in [place].

## 2022-06-11 NOTE — Telephone Encounter (Signed)
Requested paperwork and DME order has been printed and placed in provider's folder for signature. Please advise?

## 2022-06-15 ENCOUNTER — Ambulatory Visit: Payer: 59 | Admitting: Surgery

## 2022-06-18 ENCOUNTER — Ambulatory Visit
Admission: RE | Admit: 2022-06-18 | Discharge: 2022-06-18 | Disposition: A | Discharge status: Home / Self Care | Payer: 59 | Source: Ambulatory Visit | Attending: Pain Medicine | Admitting: Pain Medicine

## 2022-06-18 DIAGNOSIS — R94130 Abnormal response to nerve stimulation, unspecified: Secondary | ICD-10-CM | POA: Diagnosis present

## 2022-06-18 DIAGNOSIS — G8929 Other chronic pain: Secondary | ICD-10-CM | POA: Insufficient documentation

## 2022-06-18 DIAGNOSIS — M5417 Radiculopathy, lumbosacral region: Secondary | ICD-10-CM | POA: Diagnosis present

## 2022-06-18 DIAGNOSIS — R937 Abnormal findings on diagnostic imaging of other parts of musculoskeletal system: Secondary | ICD-10-CM | POA: Diagnosis present

## 2022-06-18 DIAGNOSIS — M5442 Lumbago with sciatica, left side: Secondary | ICD-10-CM | POA: Diagnosis present

## 2022-06-22 ENCOUNTER — Encounter: Payer: Self-pay | Admitting: Surgery

## 2022-06-22 ENCOUNTER — Other Ambulatory Visit: Payer: Self-pay

## 2022-06-22 ENCOUNTER — Ambulatory Visit (INDEPENDENT_AMBULATORY_CARE_PROVIDER_SITE_OTHER): Payer: 59 | Admitting: Surgery

## 2022-06-22 VITALS — BP 131/86 | HR 98 | Temp 98.0°F | Ht 74.0 in | Wt 231.0 lb

## 2022-06-22 DIAGNOSIS — Z09 Encounter for follow-up examination after completed treatment for conditions other than malignant neoplasm: Secondary | ICD-10-CM | POA: Diagnosis not present

## 2022-06-22 DIAGNOSIS — K449 Diaphragmatic hernia without obstruction or gangrene: Secondary | ICD-10-CM

## 2022-06-22 DIAGNOSIS — N401 Enlarged prostate with lower urinary tract symptoms: Secondary | ICD-10-CM

## 2022-06-22 DIAGNOSIS — E291 Testicular hypofunction: Secondary | ICD-10-CM

## 2022-06-22 NOTE — Patient Instructions (Signed)
   Follow-up with our office as needed.  Please call and ask to speak with a nurse if you develop questions or concerns.  

## 2022-06-23 ENCOUNTER — Other Ambulatory Visit: Payer: PPO

## 2022-06-24 NOTE — Progress Notes (Signed)
Outpatient Surgical Follow Up  06/24/2022  Darren Allen is an 57 y.o. male.   Chief Complaint  Patient presents with   Follow-up    HPI: Darren Allen is 4 months  out following open repair of paraesophageal hernia.  He does have chronic pain and COPD.  Doing well, no reflux, improvement of pulmonary symptoms and cough. Back to normal. No dysphagia no abd pain ( chronic pain still f/u w pain rx)    Past Medical History:  Diagnosis Date   Allergy    Calculus of kidney 02/04/2015   COPD (chronic obstructive pulmonary disease)    Diabetes mellitus without complication    type 2   Hypertension    Neuropathy     Past Surgical History:  Procedure Laterality Date   APPENDECTOMY     COLONOSCOPY WITH PROPOFOL N/A 12/31/2021   Procedure: COLONOSCOPY WITH PROPOFOL;  Surgeon: Wyline Mood, MD;  Location: [facility] ENDOSCOPY;  Service: Gastroenterology;  Laterality: N/A;   ESOPHAGOGASTRODUODENOSCOPY N/A 12/31/2021   Procedure: ESOPHAGOGASTRODUODENOSCOPY (EGD);  Surgeon: Wyline Mood, MD;  Location: [facility] ENDOSCOPY;  Service: Gastroenterology;  Laterality: N/A;   INCISION AND DRAINAGE PERIRECTAL ABSCESS N/A 02/04/2015   Procedure: IRRIGATION AND DEBRIDEMENT PERIRECTAL ABSCESS;  Surgeon: Ida Rogue, MD;  Location: [facility] ORS;  Service: General;  Laterality: N/A;   INSERTION OF MESH  01/20/2022   Procedure: INSERTION OF MESH;  Surgeon: Leafy Ro, MD;  Location: [facility] ORS;  Service: General;;   KIDNEY STONE SURGERY Right    lung mass removal N/A    ORIF ANKLE FRACTURE Left 03/23/2017   Procedure: OPEN REDUCTION INTERNAL FIXATION (ORIF) ANKLE FRACTURE;  Surgeon: Signa Kell, MD;  Location: [facility] ORS;  Service: Orthopedics;  Laterality: Left;   RECTAL EXAM UNDER ANESTHESIA  02/04/2015   Procedure: RECTAL EXAM UNDER ANESTHESIA;  Surgeon: Ida Rogue, MD;  Location: [facility] ORS;  Service: General;;   SYNDESMOSIS REPAIR Left 03/23/2017   Procedure: SYNDESMOSIS REPAIR;  Surgeon: Signa Kell, MD;  Location: [facility] ORS;  Service: Orthopedics;  Laterality: Left;   UMBILICAL HERNIA REPAIR  01/20/2022   Procedure: HERNIA REPAIR UMBILICAL ADULT;  Surgeon: Leafy Ro, MD;  Location: [facility] ORS;  Service: General;;   XI ROBOTIC ASSISTED PARAESOPHAGEAL HERNIA REPAIR N/A 01/20/2022   Procedure: XI ROBOTIC ASSISTED PARAESOPHAGEAL HERNIA REPAIR, CONVERTED TO OPEN, RNFA to assist;  Surgeon: Leafy Ro, MD;  Location: [facility] ORS;  Service: General;  Laterality: N/A;    Family History  Problem Relation Age of Onset   Cancer Mother 17       Lung   Cancer Father        Colon   Heart disease Father    Alcohol abuse Father    Cancer Brother 15       Esophageal   Diabetes Brother    Heart disease Brother     Social History:  reports that he has been smoking cigarettes. He has a 17.00 pack-year smoking history. He has been exposed to tobacco smoke. He has never used smokeless tobacco. He reports that he does not currently use alcohol after a past usage of about 12.0 standard drinks of alcohol per week. He reports that he does not use drugs.  Allergies:  Allergies  Allergen Reactions   Glipizide Other (See Comments) and Palpitations    Shaky, feel bad Other reaction(s): Dizziness   Cephalexin Rash   Duloxetine Anxiety and Nausea Only    Medications reviewed.    ROS Full ROS performed and is otherwise  negative other than what is stated in HPI   BP 131/86   Pulse 98   Temp 98 F (36.7 C)   Ht 6\' 2"  (1.88 m)   Wt 231 lb (104.8 kg)   SpO2 97%   BMI 29.66 kg/m   Physical Exam Vitals and nursing note reviewed.  Constitutional:      General: He is not in acute distress.    Appearance: He is not ill-appearing.  HENT:     Head: Normocephalic.     Right Ear: Hearing normal.     Left Ear: Hearing normal.     Nose: Nose normal.  Pulmonary:     Effort: Pulmonary effort is normal. No respiratory distress. Some exp wheezing ABD: soft, incision healed, no infection or  hernias Neurological:     Mental Status: He is alert.  Psychiatric:        Mood and Affect: Mood normal.        Behavior: Behavior normal.        Thought Content: Thought content normal.        Judgment: Judgment normal.       Assessment/Plan: [name] is Doing well after paraesophageal hernia repair no evidence of complications.  Continue current care and no surgical issues at this time.  Will see him on a as needed basis I Spent 20 minutes in this encounter including reviewing records, counseling the patient, placing orders and performing appropriate documentation  Sterling Big, MD [facility] General Surgeon

## 2022-06-25 ENCOUNTER — Ambulatory Visit: Payer: PPO | Admitting: Urology

## 2022-06-30 ENCOUNTER — Telehealth: Payer: Self-pay | Admitting: Nurse Practitioner

## 2022-06-30 NOTE — Telephone Encounter (Unsigned)
Copied from CRM [id number]. Topic: General - Other >> Jun 30, 2022 10:28 AM Patsy Lager T wrote: Reason for CRM: patient stated he needs a notes sent to [facility] and they need a note stating his feet were looked at and assessed by provider in order for him to get his diabetic shoes and cane. It can be faxed to [phone]; phn#[phone]

## 2022-07-01 ENCOUNTER — Other Ambulatory Visit: Payer: 59

## 2022-07-01 DIAGNOSIS — E291 Testicular hypofunction: Secondary | ICD-10-CM

## 2022-07-01 DIAGNOSIS — N138 Other obstructive and reflux uropathy: Secondary | ICD-10-CM

## 2022-07-01 NOTE — Telephone Encounter (Signed)
Last OV note for diabetic shoe discussion printed and faxed to [employer].   Called and LVM notifying patient that this was done for him.

## 2022-07-02 LAB — HEMOGLOBIN AND HEMATOCRIT, BLOOD
Hematocrit: 40.8 % (ref 37.5–51.0)
Hemoglobin: 12.6 g/dL — ABNORMAL LOW (ref 13.0–17.7)

## 2022-07-02 LAB — TESTOSTERONE: Testosterone: 244 ng/dL — ABNORMAL LOW (ref 264–916)

## 2022-07-02 LAB — PSA: Prostate Specific Ag, Serum: 0.9 ng/mL (ref 0.0–4.0)

## 2022-07-02 NOTE — Progress Notes (Unsigned)
07/03/2022 6:39 PM   Darren Allen 1965-09-07 [id number]  Referring provider: Larae Grooms, NP [address] [place],  [place] [address]  Urological history: 1.  Testosterone deficiency -Contributing factors of age, diabetes, former alcohol abuse and chronic pain medications -testosterone level (06/2022) 244 -Hemoglobin/hematocrit (06/2022) 12.9/40.8 -testosterone cypionate 200 mg/mL, 1 cc every 14 days   2. BPH with LU TS -PSA (06/2022) 0.9  3. ED -Contributing factors of age, BPH, diabetes, testosterone deficiency, chronic pain medications, blood pressure medications, COPD, hyperlipidemia, obesity, sleep apnea, former alcohol abuse and smoking  4.  Nephrolithiasis -Spontaneous passage of left ureteral stone in 2012  Chief Complaint  Patient presents with   Follow-up    follow-up     HPI: Darren Allen is a 57 y.o. male who presents today for follow up.   He is having no issues with the testosterone cypionate injections.  He is feeling more fatigued midway between injections.    I PSS 7/3  He reports perineal pain that started about 3 weeks ago.  He has been experiencing post-void dribbling for months.  Patient denies any modifying or aggravating factors.  Patient denies any gross hematuria, dysuria or suprapubic/flank pain.  Patient denies any fevers, chills, nausea or vomiting.     IPSS     Row Name 07/03/22 1100         International Prostate Symptom Score   How often have you had the sensation of not emptying your bladder? Less than half the time     How often have you had to urinate less than every two hours? Less than 1 in 5 times     How often have you found you stopped and started again several times when you urinated? Not at All     How often have you found it difficult to postpone urination? Not at All     How often have you had a weak urinary stream? Less than half the time     How often have you had to strain to start urination? Not  at All     How many times did you typically get up at night to urinate? 2 Times     Total IPSS Score 7       Quality of Life due to urinary symptoms   If you were to spend the rest of your life with your urinary condition just the way it is now how would you feel about that? Mixed                Score:  1-7 Mild 8-19 Moderate 20-35 Severe   SHIM 19 Patient still having spontaneous erections.  He denies any pain or curvature with erections.  He is having satisfactory erections with the tadalafil 5 mg, on-demand-dosing.     SHIM     Row Name 07/03/22 1130         SHIM: Over the last 6 months:   How do you rate your confidence that you could get and keep an erection? Moderate     When you had erections with sexual stimulation, how often were your erections hard enough for penetration (entering your partner)? Most Times (much more than half the time)     During sexual intercourse, how often were you able to maintain your erection after you had penetrated (entered) your partner? Most Times (much more than half the time)     During sexual intercourse, how difficult was it to maintain your  erection to completion of intercourse? Slightly Difficult     When you attempted sexual intercourse, how often was it satisfactory for you? Most Times (much more than half the time)       SHIM Total Score   SHIM 19                Score: 1-7 Severe ED 8-11 Moderate ED 12-16 Mild-Moderate ED 17-21 Mild ED 22-25 No ED     PMH: Past Medical History:  Diagnosis Date   Allergy    Calculus of kidney 02/04/2015   COPD (chronic obstructive pulmonary disease)    Diabetes mellitus without complication    type 2   Hypertension    Neuropathy     Surgical History: Past Surgical History:  Procedure Laterality Date   APPENDECTOMY     COLONOSCOPY WITH PROPOFOL N/A 12/31/2021   Procedure: COLONOSCOPY WITH PROPOFOL;  Surgeon: Wyline Mood, MD;  Location: [facility] ENDOSCOPY;  Service:  Gastroenterology;  Laterality: N/A;   ESOPHAGOGASTRODUODENOSCOPY N/A 12/31/2021   Procedure: ESOPHAGOGASTRODUODENOSCOPY (EGD);  Surgeon: Wyline Mood, MD;  Location: [facility] ENDOSCOPY;  Service: Gastroenterology;  Laterality: N/A;   INCISION AND DRAINAGE PERIRECTAL ABSCESS N/A 02/04/2015   Procedure: IRRIGATION AND DEBRIDEMENT PERIRECTAL ABSCESS;  Surgeon: Ida Rogue, MD;  Location: [facility] ORS;  Service: General;  Laterality: N/A;   INSERTION OF MESH  01/20/2022   Procedure: INSERTION OF MESH;  Surgeon: Leafy Ro, MD;  Location: [facility] ORS;  Service: General;;   KIDNEY STONE SURGERY Right    lung mass removal N/A    ORIF ANKLE FRACTURE Left 03/23/2017   Procedure: OPEN REDUCTION INTERNAL FIXATION (ORIF) ANKLE FRACTURE;  Surgeon: Signa Kell, MD;  Location: [facility] ORS;  Service: Orthopedics;  Laterality: Left;   RECTAL EXAM UNDER ANESTHESIA  02/04/2015   Procedure: RECTAL EXAM UNDER ANESTHESIA;  Surgeon: Ida Rogue, MD;  Location: [facility] ORS;  Service: General;;   SYNDESMOSIS REPAIR Left 03/23/2017   Procedure: SYNDESMOSIS REPAIR;  Surgeon: Signa Kell, MD;  Location: [facility] ORS;  Service: Orthopedics;  Laterality: Left;   UMBILICAL HERNIA REPAIR  01/20/2022   Procedure: HERNIA REPAIR UMBILICAL ADULT;  Surgeon: Leafy Ro, MD;  Location: [facility] ORS;  Service: General;;   XI ROBOTIC ASSISTED PARAESOPHAGEAL HERNIA REPAIR N/A 01/20/2022   Procedure: XI ROBOTIC ASSISTED PARAESOPHAGEAL HERNIA REPAIR, CONVERTED TO OPEN, RNFA to assist;  Surgeon: Leafy Ro, MD;  Location: [facility] ORS;  Service: General;  Laterality: N/A;    Home Medications:  Allergies as of 07/03/2022       Reactions   Glipizide Other (See Comments), Palpitations   Shaky, feel bad Other reaction(s): Dizziness   Cephalexin Rash   Duloxetine Anxiety, Nausea Only        Medication List        Accurate as of July 03, 2022 11:59 PM. If you have any questions, ask your nurse or doctor.           acetaminophen 500 MG tablet Commonly known as: TYLENOL Take 2 tablets (1,000 mg total) by mouth every 8 (eight) hours.   amitriptyline 10 MG tablet Commonly known as: ELAVIL Take 40 mg by mouth at bedtime.   ascorbic acid 500 MG tablet Commonly known as: VITAMIN C Take by mouth daily.   EPINEPHrine 0.3 mg/0.3 mL Soaj injection Commonly known as: EPI-PEN Inject 0.3 mg into the muscle as needed.   ferrous sulfate 325 (65 FE) MG tablet Take 325 mg by mouth daily with breakfast.   fluticasone 50 MCG/ACT  nasal spray Commonly known as: FLONASE Place into both nostrils.   gabapentin 600 MG tablet Commonly known as: NEURONTIN Take 600 mg by mouth 3 (three) times daily.   HumuLIN 70/30 KwikPen (70-30) 100 UNIT/ML KwikPen Generic drug: insulin isophane & regular human KwikPen Inject 58 Units into the skin 2 (two) times daily with a meal. 60 in the a.m. and 40u qhs   hydrochlorothiazide 25 MG tablet Commonly known as: HYDRODIURIL Take 0.5 tablets (12.5 mg total) by mouth daily. What changed: how much to take   HYDROcodone-acetaminophen 5-325 MG tablet Commonly known as: NORCO/VICODIN Take 1 tablet by mouth 2 (two) times daily as needed. Must last 30 days.   HYDROcodone-acetaminophen 5-325 MG tablet Commonly known as: NORCO/VICODIN Take 1 tablet by mouth 2 (two) times daily as needed. Must last 30 days.   HYDROcodone-acetaminophen 5-325 MG tablet Commonly known as: NORCO/VICODIN Take 1 tablet by mouth 2 (two) times daily as needed. Must last 30 days. Start taking on: Jul 26, 2022   ipratropium-albuterol 0.5-2.5 (3) MG/3ML Soln Commonly known as: DUONEB Take by nebulization.   lisinopril 20 MG tablet Commonly known as: ZESTRIL Take 1 tablet (20 mg total) by mouth daily.   Magnesium Oxide -Mg Supplement 500 MG Tabs Take 1 tablet by mouth daily.   meloxicam 15 MG tablet Commonly known as: MOBIC Take 15 mg by mouth daily.   metFORMIN 500 MG 24 hr tablet Commonly  known as: GLUCOPHAGE-XR TAKE 4 TABLETS(2000 MG) BY MOUTH DAILY   montelukast 10 MG tablet Commonly known as: SINGULAIR Take 10 mg by mouth daily.   naloxone 4 MG/0.1ML Liqd nasal spray kit Commonly known as: NARCAN Place 1 spray into the nose as needed for up to 365 doses (for opioid-induced respiratory depresssion). In case of emergency (overdose), spray once into each nostril. If no response within 3 minutes, repeat application and call 911.   ondansetron 4 MG tablet Commonly known as: Zofran Take 1 tablet (4 mg total) by mouth every 8 (eight) hours as needed for nausea or vomiting.   predniSONE 5 MG tablet Commonly known as: DELTASONE Take 5 mg by mouth daily.   ProAir HFA 108 (90 Base) MCG/ACT inhaler Generic drug: albuterol Inhale 2 puffs into the lungs every 6 (six) hours as needed.   rosuvastatin 40 MG tablet Commonly known as: CRESTOR Take 1 tablet (40 mg total) by mouth daily.   sulfamethoxazole-trimethoprim 800-160 MG tablet Commonly known as: BACTRIM DS Take 1 tablet by mouth every 12 (twelve) hours. Started by: Michiel Cowboy, PA-C   tadalafil 5 MG tablet Commonly known as: CIALIS Take 1 tablet (5 mg total) by mouth daily as needed for erectile dysfunction.   tamsulosin 0.4 MG Caps capsule Commonly known as: FLOMAX Take 1 capsule (0.4 mg total) by mouth daily. Started by: Michiel Cowboy, PA-C   testosterone cypionate 200 MG/ML injection Commonly known as: DEPOTESTOSTERONE CYPIONATE Inject 1 cc every 7 days What changed:  how much to take how to take this when to take this additional instructions Changed by: Michiel Cowboy, PA-C   Trelegy Ellipta 200-62.5-25 MCG/ACT Aepb Generic drug: Fluticasone-Umeclidin-Vilant Inhale 1 puff into the lungs daily.   triamcinolone cream 0.1 % Commonly known as: KENALOG Apply 1 application  topically 2 (two) times daily.   Trulicity 3 MG/0.5ML Sopn Generic drug: Dulaglutide Inject 4.5 mg into the skin once a  week.        Allergies:  Allergies  Allergen Reactions   Glipizide Other (See Comments) and Palpitations  Shaky, feel bad Other reaction(s): Dizziness   Cephalexin Rash   Duloxetine Anxiety and Nausea Only    Family History: Family History  Problem Relation Age of Onset   Cancer Mother 39       Lung   Cancer Father        Colon   Heart disease Father    Alcohol abuse Father    Cancer Brother 41       Esophageal   Diabetes Brother    Heart disease Brother     Social History:  reports that he has been smoking cigarettes. He has a 17.00 pack-year smoking history. He has been exposed to tobacco smoke. He has never used smokeless tobacco. He reports that he does not currently use alcohol after a past usage of about 12.0 standard drinks of alcohol per week. He reports that he does not use drugs.  ROS: Pertinent ROS in HPI  Physical Exam: BP 127/79   Pulse (!) 106   Ht 6\' 2"  (1.88 m)   Wt 220 lb (99.8 kg)   BMI 28.25 kg/m   Constitutional:  Well nourished. Alert and oriented, No acute distress. HEENT: [place] AT, moist mucus membranes.  Trachea midline Cardiovascular: No clubbing, cyanosis, or edema. Respiratory: Normal respiratory effort, no increased work of breathing. GU: No CVA tenderness.  No bladder fullness or masses.  Patient with uncircumcised phallus. Foreskin easily retracted  Urethral meatus is patent.  No penile discharge. No penile lesions or rashes. Scrotum without lesions, cysts, rashes and/or edema.  Testicles are located scrotally bilaterally. No masses are appreciated in the testicles. Left and right epididymis are normal. Rectal: Patient with  normal sphincter tone. Anus and perineum without scarring or rashes. No rectal masses are appreciated. Prostate is approximately 50 grams, tender in the left lobe, no nodules are appreciated. Seminal vesicles could no be palpated. Neurologic: Grossly intact, no focal deficits, moving all 4 extremities. Psychiatric:  Normal mood and affect.   Laboratory Data: Lab Results  Component Value Date   WBC 7.5 05/22/2022   HGB 12.6 (L) 07/01/2022   HCT 40.8 07/01/2022   MCV 76 (L) 05/22/2022   PLT 276 05/22/2022      Latest Ref Rng & Units 05/22/2022   10:46 AM 02/20/2022    9:36 AM 02/15/2022    7:14 PM  CMP  Glucose 70 - 99 mg/dL 161  096  045   BUN 6 - 24 mg/dL 16  14  16    Creatinine 0.76 - 1.27 mg/dL 4.09  8.11  9.14   Sodium 134 - 144 mmol/L 137  139  138   Potassium 3.5 - 5.2 mmol/L 4.3  3.7  3.6   Chloride 96 - 106 mmol/L 99  100  104   CO2 20 - 29 mmol/L 23  23  27    Calcium 8.7 - 10.2 mg/dL 9.9  9.3  9.2   Total Protein 6.0 - 8.5 g/dL 6.9  6.6  7.2   Total Bilirubin 0.0 - 1.2 mg/dL 0.4  0.4  0.8   Alkaline Phos 44 - 121 IU/L 84  86  71   AST 0 - 40 IU/L 17  20  25    ALT 0 - 44 IU/L 21  19  25       Lab Results  Component Value Date   TESTOSTERONE 244 (L) 07/01/2022   Lab Results  Component Value Date   AST 17 05/22/2022   Lab Results  Component Value Date  ALT 21 05/22/2022  I have reviewed the labs.   Pertinent Imaging: N/A  Assessment & Plan:    1. Testosterone deficiency  -testosterone levels subtherapeutic -will increase injections to 1.0 cc every 7 days -will recheck levels after fourth injection  2. BPH with LUTS -PSA stable -bothersome symptoms of post-void dribbling -continue conservative management, avoiding bladder irritants and timed voiding's -start tamsulosin 0.4 mg daily -RTC in one month for I PSS, PVR and UA  3. Erectile dysfunction:    -continue tadalafil 5 mg, on-demand-dosing  4.  Nephrolithiasis -No issues with renal colic or passage of fragments since last visit  5. Prostatitis -tenderness appreciated on DRE -start Septra DS BID x 30 days -RTC in one month for repeat DRE     Return in 1 month (on 08/02/2022) for 4 week follow-up for I PSS, PVR, DRE and UA .  These notes generated with voice recognition software. I apologize for  typographical errors.  Cloretta Ned  [facility] [address]  [address] [place], [place] [address] [phone]

## 2022-07-03 ENCOUNTER — Encounter: Payer: Self-pay | Admitting: Urology

## 2022-07-03 ENCOUNTER — Ambulatory Visit (INDEPENDENT_AMBULATORY_CARE_PROVIDER_SITE_OTHER): Payer: 59 | Admitting: Urology

## 2022-07-03 VITALS — BP 127/79 | HR 106 | Ht 74.0 in | Wt 220.0 lb

## 2022-07-03 DIAGNOSIS — N138 Other obstructive and reflux uropathy: Secondary | ICD-10-CM

## 2022-07-03 DIAGNOSIS — E291 Testicular hypofunction: Secondary | ICD-10-CM

## 2022-07-03 DIAGNOSIS — Z87442 Personal history of urinary calculi: Secondary | ICD-10-CM

## 2022-07-03 DIAGNOSIS — E349 Endocrine disorder, unspecified: Secondary | ICD-10-CM

## 2022-07-03 DIAGNOSIS — N401 Enlarged prostate with lower urinary tract symptoms: Secondary | ICD-10-CM | POA: Diagnosis not present

## 2022-07-03 DIAGNOSIS — N411 Chronic prostatitis: Secondary | ICD-10-CM | POA: Diagnosis not present

## 2022-07-03 DIAGNOSIS — N200 Calculus of kidney: Secondary | ICD-10-CM

## 2022-07-03 DIAGNOSIS — N529 Male erectile dysfunction, unspecified: Secondary | ICD-10-CM

## 2022-07-03 MED ORDER — SULFAMETHOXAZOLE-TRIMETHOPRIM 800-160 MG PO TABS
1.0000 | ORAL_TABLET | Freq: Two times a day (BID) | ORAL | 0 refills | Status: DC
Start: 2022-07-03 — End: 2022-08-24

## 2022-07-03 MED ORDER — TESTOSTERONE CYPIONATE 200 MG/ML IM SOLN
INTRAMUSCULAR | 0 refills | Status: DC
Start: 2022-07-03 — End: 2022-08-31

## 2022-07-03 MED ORDER — TAMSULOSIN HCL 0.4 MG PO CAPS
0.4000 mg | ORAL_CAPSULE | Freq: Every day | ORAL | 3 refills | Status: DC
Start: 2022-07-03 — End: 2023-03-31

## 2022-07-04 ENCOUNTER — Ambulatory Visit
Admission: EM | Admit: 2022-07-04 | Discharge: 2022-07-04 | Disposition: A | Discharge status: Home / Self Care | Payer: 59 | Attending: Nurse Practitioner | Admitting: Nurse Practitioner

## 2022-07-04 ENCOUNTER — Encounter: Payer: Self-pay | Admitting: Emergency Medicine

## 2022-07-04 DIAGNOSIS — R21 Rash and other nonspecific skin eruption: Secondary | ICD-10-CM

## 2022-07-04 MED ORDER — TRIAMCINOLONE ACETONIDE 0.1 % EX CREA
1.0000 | TOPICAL_CREAM | Freq: Two times a day (BID) | CUTANEOUS | 0 refills | Status: DC
Start: 2022-07-04 — End: 2023-03-31

## 2022-07-04 NOTE — Discharge Instructions (Addendum)
Topical Kenalog cream twice daily to the affected areas Follow-up with your PCP if your symptoms do not improve Please go to the ER for any worsening symptoms

## 2022-07-04 NOTE — ED Provider Notes (Signed)
[facility]    CSN: [id number] Arrival date & time: 07/04/22  1158      History   Chief Complaint Chief Complaint  Patient presents with   Rash    HPI Darren Allen is a 57 y.o. male is for evaluation of rash.  Patient reports yesterday he noticed a linear red rash to the backside of his right lower leg.  This morning he noticed a similar linear red rash to the front of his left leg.  Denies any pain, swelling, drainage, warmth.  States they are not pruritic.  Does report he was lifting up bales of yard debris a few days ago and it may have contained poison ivy or oak.  No history of eczema or psoriasis.  He has not used any OTC medications since symptom onset.  No other concerns at this time.   Rash   Past Medical History:  Diagnosis Date   Allergy    Calculus of kidney 02/04/2015   COPD (chronic obstructive pulmonary disease)    Diabetes mellitus without complication    type 2   Hypertension    Neuropathy     Patient Active Problem List   Diagnosis Date Noted   Abnormal MRI, thoracic spine (07/05/2020) 06/09/2022   Coronary artery disease involving native coronary artery of native heart without angina pectoris 05/06/2022   Chronic low back pain (Bilateral) w/ sciatica (Left) 04/02/2022   Advanced care planning/counseling discussion 02/20/2022   Hypotension due to drugs 02/04/2022   Elevated lipase 02/04/2022   Iron deficiency anemia 02/04/2022   Hyperkalemia 01/21/2022   S/P repair of paraesophageal hernia 01/20/2022   Umbilical hernia without obstruction and without gangrene 01/20/2022   Hiatal hernia    Adenomatous polyp of colon    Abnormal MRI, lumbar spine (03/11/2020) 10/29/2021   DDD (degenerative disc disease), lumbosacral 10/29/2021   Lumbosacral lateral recess stenosis (Left: L5-S1) 10/29/2021   Elevated C-reactive protein (CRP) 09/18/2021   Chronic pain syndrome 09/08/2021   Pharmacologic therapy 09/08/2021   Disorder of skeletal system  09/08/2021   Problems influencing health status 09/08/2021   Lumbar facet syndrome 09/08/2021   Lumbosacral radiculopathy at S1 (Left) 09/08/2021   Chronic feet pain (2ry area of Pain) (Bilateral) 09/08/2021   Chronic ankle pain (Left) 09/08/2021   Abnormal NCS (nerve conduction studies) (02/20/2020) 09/08/2021   Neuropathy 06/19/2021   Gout 05/28/2021   PVD (peripheral vascular disease) 05/28/2021   Hypertension associated with diabetes    Chronic ankle pain (Bilateral) 07/25/2020   Chronic low back pain (1ry area of Pain) (Bilateral) (R>L) w/o sciatica 07/25/2020   Diabetic peripheral neuropathy 12/30/2019   Other acquired hammer toe 11/28/2019   Ankle fracture 03/23/2017   Type 2 diabetes mellitus with diabetic neuropathy, without long-term current use of insulin 04/16/2016   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   Calculus of kidney 02/04/2015   Chronic obstructive pulmonary disease 01/17/2013   Acid reflux 01/17/2013   Hyperlipidemia associated with type 2 diabetes mellitus 01/17/2013   Apnea, sleep 01/17/2013   Testicular hypofunction 01/17/2013   Adiposity 07/06/2011    Past Surgical History:  Procedure Laterality Date   APPENDECTOMY     COLONOSCOPY WITH PROPOFOL N/A 12/31/2021   Procedure: COLONOSCOPY WITH PROPOFOL;  Surgeon: Wyline Mood, MD;  Location: [facility] END[facility];  Service: Gastroenterology;  Laterality: N/A;   ESOPHAGOGASTRODUODENOSCOPY N/A 12/31/2021   Procedure: ESOPHAGOGASTRODUODENOSCOPY (EGD);  Surgeon: Wyline Mood, MD;  Location: [facility] ENDOSCOPY;  Service: Gastroenterology;  Laterality: N/A;   INCISION AND DRAINAGE PERIRECTAL ABSCESS  N/A 02/04/2015   Procedure: IRRIGATION AND DEBRIDEMENT PERIRECTAL ABSCESS;  Surgeon: Ida Rogue, MD;  Location: [facility] ORS;  Service: General;  Laterality: N/A;   INSERTION OF MESH  01/20/2022   Procedure: INSERTION OF MESH;  Surgeon: Leafy Ro, MD;  Location: [facility] ORS;  Service: General;;   KIDNEY STONE SURGERY Right    lung mass  removal N/A    ORIF ANKLE FRACTURE Left 03/23/2017   Procedure: OPEN REDUCTION INTERNAL FIXATION (ORIF) ANKLE FRACTURE;  Surgeon: Signa Kell, MD;  Location: [facility] ORS;  Service: Orthopedics;  Laterality: Left;   RECTAL EXAM UNDER ANESTHESIA  02/04/2015   Procedure: RECTAL EXAM UNDER ANESTHESIA;  Surgeon: Ida Rogue, MD;  Location: [facility] ORS;  Service: General;;   SYNDESMOSIS REPAIR Left 03/23/2017   Procedure: SYNDESMOSIS REPAIR;  Surgeon: Signa Kell, MD;  Location: [facility] ORS;  Service: Orthopedics;  Laterality: Left;   UMBILICAL HERNIA REPAIR  01/20/2022   Procedure: HERNIA REPAIR UMBILICAL ADULT;  Surgeon: Leafy Ro, MD;  Location: [facility] ORS;  Service: General;;   XI ROBOTIC ASSISTED PARAESOPHAGEAL HERNIA REPAIR N/A 01/20/2022   Procedure: XI ROBOTIC ASSISTED PARAESOPHAGEAL HERNIA REPAIR, CONVERTED TO OPEN, RNFA to assist;  Surgeon: Leafy Ro, MD;  Location: [facility] ORS;  Service: General;  Laterality: N/A;       Home Medications    Prior to Admission medications   Medication Sig Start Date End Date Taking? Authorizing Provider  triamcinolone cream (KENALOG) 0.1 % Apply 1 Application topically 2 (two) times daily. 07/04/22  Yes Radford Pax, NP  acetaminophen (TYLENOL) 500 MG tablet Take 2 tablets (1,000 mg total) by mouth every 8 (eight) hours. 03/24/17   Dedra Skeens, PA-C  amitriptyline (ELAVIL) 10 MG tablet Take 40 mg by mouth at bedtime.    [provider]  ascorbic acid (VITAMIN C) 500 MG tablet Take by mouth daily.    [provider]  EPINEPHrine 0.3 mg/0.3 mL IJ SOAJ injection Inject 0.3 mg into the muscle as needed. 06/29/22   [provider]  ferrous sulfate 325 (65 FE) MG tablet Take 325 mg by mouth daily with breakfast.    [provider]  fluticasone (FLONASE) 50 MCG/ACT nasal spray Place into both nostrils. 07/02/21   [provider]  gabapentin (NEURONTIN) 600 MG tablet Take 600 mg by mouth 3 (three) times daily.  03/26/21   [provider]  hydrochlorothiazide (HYDRODIURIL) 25 MG tablet Take 0.5 tablets (12.5 mg total) by mouth daily. Patient taking differently: Take 25 mg by mouth daily. 02/04/22   Cannady, Corrie Dandy T, NP  HYDROcodone-acetaminophen (NORCO/VICODIN) 5-325 MG tablet Take 1 tablet by mouth 2 (two) times daily as needed. Must last 30 days. 05/27/22 06/26/22  Delano Metz, MD  HYDROcodone-acetaminophen (NORCO/VICODIN) 5-325 MG tablet Take 1 tablet by mouth 2 (two) times daily as needed. Must last 30 days. 06/26/22 07/26/22  Delano Metz, MD  HYDROcodone-acetaminophen (NORCO/VICODIN) 5-325 MG tablet Take 1 tablet by mouth 2 (two) times daily as needed. Must last 30 days. 07/26/22 08/25/22  Delano Metz, MD  insulin isophane & regular human (HUMULIN 70/30 KWIKPEN) (70-30) 100 UNIT/ML KwikPen Inject 58 Units into the skin 2 (two) times daily with a meal. 60 in the a.m. and 40u qhs 10/14/19   [provider]  ipratropium-albuterol (DUONEB) 0.5-2.5 (3) MG/3ML SOLN Take by nebulization.    [provider]  lisinopril (ZESTRIL) 20 MG tablet Take 1 tablet (20 mg total) by mouth daily. 04/29/22   Larae Grooms, NP  Magnesium  Oxide -Mg Supplement 500 MG TABS Take 1 tablet by mouth daily. 06/27/22   [provider]  meloxicam (MOBIC) 15 MG tablet Take 15 mg by mouth daily. 06/13/22   [provider]  metFORMIN (GLUCOPHAGE-XR) 500 MG 24 hr tablet TAKE 4 TABLETS(2000 MG) BY MOUTH DAILY 12/16/21   Larae Grooms, NP  montelukast (SINGULAIR) 10 MG tablet Take 10 mg by mouth daily. 07/02/21   [provider]  naloxone [facility]) nasal spray 4 mg/0.1 mL Place 1 spray into the nose as needed for up to 365 doses (for opioid-induced respiratory depresssion). In case of emergency (overdose), spray once into each nostril. If no response within 3 minutes, repeat application and call 911. 01/13/22 01/13/23  Delano Metz, MD  ondansetron (ZOFRAN) 4 MG  tablet Take 1 tablet (4 mg total) by mouth every 8 (eight) hours as needed for nausea or vomiting. 02/02/22   Pabon, Diego F, MD  predniSONE (DELTASONE) 5 MG tablet Take 5 mg by mouth daily. 06/24/22 09/22/22  [provider]  PROAIR HFA 108 (90 Base) MCG/ACT inhaler Inhale 2 puffs into the lungs every 6 (six) hours as needed. 12/28/20   [provider]  rosuvastatin (CRESTOR) 40 MG tablet Take 1 tablet (40 mg total) by mouth daily. 03/25/22   Larae Grooms, NP  sulfamethoxazole-trimethoprim (BACTRIM DS) 800-160 MG tablet Take 1 tablet by mouth every 12 (twelve) hours. 07/03/22   Michiel Cowboy A, PA-C  tadalafil (CIALIS) 5 MG tablet Take 1 tablet (5 mg total) by mouth daily as needed for erectile dysfunction. 01/07/22   Michiel Cowboy A, PA-C  tamsulosin (FLOMAX) 0.4 MG CAPS capsule Take 1 capsule (0.4 mg total) by mouth daily. 07/03/22   Michiel Cowboy A, PA-C  testosterone cypionate (DEPOTESTOSTERONE CYPIONATE) 200 MG/ML injection Inject 1 cc every 7 days 07/03/22   Michiel Cowboy A, PA-C  TRELEGY ELLIPTA 200-62.5-25 MCG/INH AEPB Inhale 1 puff into the lungs daily. 10/10/20   [provider]  TRULICITY 3 MG/0.5ML SOPN Inject 4.5 mg into the skin once a week. 10/28/21   [provider]    Family History Family History  Problem Relation Age of Onset   Cancer Mother 99       Lung   Cancer Father        Colon   Heart disease Father    Alcohol abuse Father    Cancer Brother 68       Esophageal   Diabetes Brother    Heart disease Brother     Social History Social History   Tobacco Use   Smoking status: Every Day    Packs/day: 0.50    Years: 34.00    Additional pack years: 0.00    Total pack years: 17.00    Types: Cigarettes    Passive exposure: Past   Smokeless tobacco: Never   Tobacco comments:    patient using Nicoderm patches  Vaping Use   Vaping Use: Never used  Substance Use Topics   Alcohol use: Not Currently    Alcohol/week: 12.0  standard drinks of alcohol    Types: 12 Cans of beer per week    Comment: Quit February 2023   Drug use: No     Allergies   Glipizide, Cephalexin, and Duloxetine   Review of Systems Review of Systems  Skin:  Positive for rash.     Physical Exam Triage Vital Signs ED Triage Vitals  Enc Vitals Group     BP 07/04/22 1233 (!) 143/87  Pulse Rate 07/04/22 1233 (!) 110     Resp 07/04/22 1233 15     Temp 07/04/22 1233 98.8 F (37.1 C)     Temp Source 07/04/22 1233 Oral     SpO2 07/04/22 1233 95 %     Weight 07/04/22 1230 230 lb (104.3 kg)     Height 07/04/22 1230  (1.88 m)     Head Circumference --      Peak Flow --      Pain Score 07/04/22 1230 0     Pain Loc --      Pain Edu? --      Excl. in GC? --    No data found.  Updated Vital Signs BP (!) 143/87 (BP Location: Right Arm)   Pulse (!) 110   Temp 98.8 F (37.1 C) (Oral)   Resp 15   Ht  (1.88 m)   Wt 230 lb (104.3 kg)   SpO2 95%   BMI 29.53 kg/m   Visual Acuity Right Eye Distance:   Left Eye Distance:   Bilateral Distance:    Right Eye Near:   Left Eye Near:    Bilateral Near:     Physical Exam Vitals and nursing note reviewed.  Constitutional:      Appearance: Normal appearance.  HENT:     Head: Normocephalic and atraumatic.  Eyes:     Pupils: Pupils are equal, round, and reactive to light.  Cardiovascular:     Rate and Rhythm: Normal rate.  Pulmonary:     Effort: Pulmonary effort is normal.  Skin:    General: Skin is warm and dry.          Comments: Mildly erythematous linear macular papular rash to the posterior right lower leg and anterior left lower leg.  No lesions or vesicles.  No drainage, warmth, swelling.  Nontender to palpation.  Neurological:     General: No focal deficit present.     Mental Status: He is alert and oriented to person, place, and time.  Psychiatric:        Mood and Affect: Mood normal.        Behavior: Behavior normal.      UC Treatments /  Results  Labs (all labs ordered are listed, but only abnormal results are displayed) Labs Reviewed - No data to display  EKG   Radiology No results found.  Procedures Procedures (including critical care time)  Medications Ordered in UC Medications - No data to display  Initial Impression / Assessment and Plan / UC Course  I have reviewed the triage vital signs and the nursing notes.  Pertinent labs & imaging results that were available during my care of the patient were reviewed by me and considered in my medical decision making (see chart for details).     Pattern consistent with likely poison ivy although not pruritic.  Does not appear infectious.  Will trial Kenalog cream to affected area He is to follow-up with his PCP if symptoms do not improve ER precautions reviewed and patient verbalized understanding Final Clinical Impressions(s) / UC Diagnoses   Final diagnoses:  Rash     Discharge Instructions      Topical Kenalog cream twice daily to the affected areas Follow-up with your PCP if your symptoms do not improve Please go to the ER for any worsening symptoms    ED Prescriptions     Medication Sig Dispense Auth. Provider   triamcinolone cream (KENALOG) 0.1 % Apply  1 Application topically 2 (two) times daily. 30 g Radford Pax, NP      PDMP not reviewed this encounter.   Radford Pax, NP 07/04/22 1247

## 2022-07-04 NOTE — ED Triage Notes (Signed)
Patient states that he noticed two red raised areas on his right and left lower leg today.  Patient denies pain or itching.  Patient denies fevers.

## 2022-07-06 ENCOUNTER — Encounter: Payer: Self-pay | Admitting: Pain Medicine

## 2022-07-06 ENCOUNTER — Encounter: Payer: Self-pay | Admitting: Nurse Practitioner

## 2022-07-15 ENCOUNTER — Telehealth: Payer: Self-pay | Admitting: Nurse Practitioner

## 2022-07-15 ENCOUNTER — Ambulatory Visit: Payer: 59 | Admitting: Pain Medicine

## 2022-07-15 NOTE — Telephone Encounter (Signed)
Copied from CRM [id number]. Topic: General - Other >> Jul 15, 2022 10:02 AM Clide Dales wrote: Dimas Aguas from [employer] called requesting office notes for patient so he can get diabetic shoes. Dimas Aguas also stated that he sent a fax that needs to be signed and returned. Please follow up with Dimas Aguas at [phone].

## 2022-07-15 NOTE — Telephone Encounter (Signed)
Form placed in folder for providers signature. Will fax one completed.

## 2022-07-16 NOTE — Telephone Encounter (Signed)
Form signed and faxed back along with office visit notes.

## 2022-07-22 ENCOUNTER — Encounter: Payer: 59 | Admitting: Pain Medicine

## 2022-07-30 NOTE — Progress Notes (Unsigned)
07/31/2022 8:25 PM   Darren Allen 02/27/1966 [id number]  Referring provider: Larae Grooms, NP [address] [place],  [place] [address]  Urological history: 1.  Testosterone deficiency -Contributing factors of age, diabetes, former alcohol abuse and chronic pain medications -testosterone level (06/2022) 244 -Hemoglobin/hematocrit (06/2022) 12.9/40.8 -testosterone cypionate 200 mg/mL, 1 cc every 14 days   2. BPH with LU TS -PSA (06/2022) 0.9  3. ED -Contributing factors of age, BPH, diabetes, testosterone deficiency, chronic pain medications, blood pressure medications, COPD, hyperlipidemia, obesity, sleep apnea, former alcohol abuse and smoking  4.  Nephrolithiasis -Spontaneous passage of left ureteral stone in 2012  Chief Complaint  Patient presents with   Follow-up    Hypogonadism in male     HPI: Darren Allen is a 57 y.o. male who presents today for a one month follow up after starting tamsulosin 0.4 mg daily.    He is having no issues with the testosterone cypionate injections.  He is not feeling the fatigue in between shots since he has increased his injections to every 7 days.  He would also like his levels checked today.   He last injected himself on Monday.    I PSS 9/0  PVR 0 mL   He has noticed a decrease in his post-void dribbling since starting the tamsulosin 0.4 mg daily.  Patient denies any modifying or aggravating factors.  Patient denies any gross hematuria, dysuria or suprapubic/flank pain.  Patient denies any fevers, chills, nausea or vomiting.    UA yellow clear, trace glucose, specific gravity 1.020, pH 8.5 and 0-5 WBC's.    IPSS     Row Name 07/31/22 1100         International Prostate Symptom Score   How often have you had the sensation of not emptying your bladder? Less than 1 in 5     How often have you had to urinate less than every two hours? Less than 1 in 5 times     How often have you found you stopped and started again  several times when you urinated? Less than half the time     How often have you found it difficult to postpone urination? Less than 1 in 5 times     How often have you had a weak urinary stream? About half the time     How often have you had to strain to start urination? Less than 1 in 5 times     How many times did you typically get up at night to urinate? 3 Times     Total IPSS Score 12       Quality of Life due to urinary symptoms   If you were to spend the rest of your life with your urinary condition just the way it is now how would you feel about that? Terrible                 Score:  1-7 Mild 8-19 Moderate 20-35 Severe   PMH: Past Medical History:  Diagnosis Date   Allergy    Calculus of kidney 02/04/2015   COPD (chronic obstructive pulmonary disease) (HCC)    Diabetes mellitus without complication (HCC)    type 2   Hypertension    Neuropathy     Surgical History: Past Surgical History:  Procedure Laterality Date   APPENDECTOMY     COLONOSCOPY WITH PROPOFOL N/A 12/31/2021   Procedure: COLONOSCOPY WITH PROPOFOL;  Surgeon: Wyline Mood, MD;  Location: [facility]  ENDOSCOPY;  Service: Gastroenterology;  Laterality: N/A;   ESOPHAGOGASTRODUODENOSCOPY N/A 12/31/2021   Procedure: ESOPHAGOGASTRODUODENOSCOPY (EGD);  Surgeon: Wyline Mood, MD;  Location: [facility] ENDOSCOPY;  Service: Gastroenterology;  Laterality: N/A;   INCISION AND DRAINAGE PERIRECTAL ABSCESS N/A 02/04/2015   Procedure: IRRIGATION AND DEBRIDEMENT PERIRECTAL ABSCESS;  Surgeon: Ida Rogue, MD;  Location: [facility] ORS;  Service: General;  Laterality: N/A;   INSERTION OF MESH  01/20/2022   Procedure: INSERTION OF MESH;  Surgeon: Leafy Ro, MD;  Location: [facility] ORS;  Service: General;;   KIDNEY STONE SURGERY Right    lung mass removal N/A    ORIF ANKLE FRACTURE Left 03/23/2017   Procedure: OPEN REDUCTION INTERNAL FIXATION (ORIF) ANKLE FRACTURE;  Surgeon: Signa Kell, MD;  Location: [facility] ORS;  Service:  Orthopedics;  Laterality: Left;   RECTAL EXAM UNDER ANESTHESIA  02/04/2015   Procedure: RECTAL EXAM UNDER ANESTHESIA;  Surgeon: Ida Rogue, MD;  Location: [facility] ORS;  Service: General;;   SYNDESMOSIS REPAIR Left 03/23/2017   Procedure: SYNDESMOSIS REPAIR;  Surgeon: Signa Kell, MD;  Location: [facility] ORS;  Service: Orthopedics;  Laterality: Left;   UMBILICAL HERNIA REPAIR  01/20/2022   Procedure: HERNIA REPAIR UMBILICAL ADULT;  Surgeon: Leafy Ro, MD;  Location: [facility] ORS;  Service: General;;   XI ROBOTIC ASSISTED PARAESOPHAGEAL HERNIA REPAIR N/A 01/20/2022   Procedure: XI ROBOTIC ASSISTED PARAESOPHAGEAL HERNIA REPAIR, CONVERTED TO OPEN, RNFA to assist;  Surgeon: Leafy Ro, MD;  Location: [facility] ORS;  Service: General;  Laterality: N/A;    Home Medications:  Allergies as of 07/31/2022       Reactions   Glipizide Other (See Comments), Palpitations   Shaky, feel bad Other reaction(s): Dizziness   Cephalexin Rash   Duloxetine Anxiety, Nausea Only        Medication List        Accurate as of Jul 31, 2022 11:59 PM. If you have any questions, ask your nurse or doctor.          acetaminophen 500 MG tablet Commonly known as: TYLENOL Take 2 tablets (1,000 mg total) by mouth every 8 (eight) hours.   amitriptyline 10 MG tablet Commonly known as: ELAVIL Take 40 mg by mouth at bedtime.   ascorbic acid 500 MG tablet Commonly known as: VITAMIN C Take by mouth daily.   EPINEPHrine 0.3 mg/0.3 mL Soaj injection Commonly known as: EPI-PEN Inject 0.3 mg into the muscle as needed.   ferrous sulfate 325 (65 FE) MG tablet Take 325 mg by mouth daily with breakfast.   fluticasone 50 MCG/ACT nasal spray Commonly known as: FLONASE Place into both nostrils.   gabapentin 600 MG tablet Commonly known as: NEURONTIN Take 600 mg by mouth 3 (three) times daily.   HumuLIN 70/30 KwikPen (70-30) 100 UNIT/ML KwikPen Generic drug: insulin isophane & regular human KwikPen Inject 58  Units into the skin 2 (two) times daily with a meal. 60 in the a.m. and 40u qhs   hydrochlorothiazide 25 MG tablet Commonly known as: HYDRODIURIL Take 0.5 tablets (12.5 mg total) by mouth daily. What changed: how much to take   HYDROcodone-acetaminophen 5-325 MG tablet Commonly known as: NORCO/VICODIN Take 1 tablet by mouth 2 (two) times daily as needed. Must last 30 days.   HYDROcodone-acetaminophen 5-325 MG tablet Commonly known as: NORCO/VICODIN Take 1 tablet by mouth 2 (two) times daily as needed. Must last 30 days.   HYDROcodone-acetaminophen 5-325 MG tablet Commonly known as: NORCO/VICODIN Take 1 tablet by mouth 2 (two) times daily as  needed. Must last 30 days.   ipratropium-albuterol 0.5-2.5 (3) MG/3ML Soln Commonly known as: DUONEB Take by nebulization.   lisinopril 20 MG tablet Commonly known as: ZESTRIL Take 1 tablet (20 mg total) by mouth daily.   Magnesium Oxide -Mg Supplement 500 MG Tabs Take 1 tablet by mouth daily.   meloxicam 15 MG tablet Commonly known as: MOBIC Take 15 mg by mouth daily.   metFORMIN 500 MG 24 hr tablet Commonly known as: GLUCOPHAGE-XR TAKE 4 TABLETS(2000 MG) BY MOUTH DAILY   montelukast 10 MG tablet Commonly known as: SINGULAIR Take 10 mg by mouth daily.   naloxone 4 MG/0.1ML Liqd nasal spray kit Commonly known as: NARCAN Place 1 spray into the nose as needed for up to 365 doses (for opioid-induced respiratory depresssion). In case of emergency (overdose), spray once into each nostril. If no response within 3 minutes, repeat application and call 911.   ondansetron 4 MG tablet Commonly known as: Zofran Take 1 tablet (4 mg total) by mouth every 8 (eight) hours as needed for nausea or vomiting.   predniSONE 5 MG tablet Commonly known as: DELTASONE Take 5 mg by mouth daily.   ProAir HFA 108 (90 Base) MCG/ACT inhaler Generic drug: albuterol Inhale 2 puffs into the lungs every 6 (six) hours as needed.   rosuvastatin 40 MG  tablet Commonly known as: CRESTOR Take 1 tablet (40 mg total) by mouth daily.   sulfamethoxazole-trimethoprim 800-160 MG tablet Commonly known as: BACTRIM DS Take 1 tablet by mouth every 12 (twelve) hours.   tadalafil 5 MG tablet Commonly known as: CIALIS Take 1 tablet (5 mg total) by mouth daily as needed for erectile dysfunction.   tamsulosin 0.4 MG Caps capsule Commonly known as: FLOMAX Take 1 capsule (0.4 mg total) by mouth daily.   testosterone cypionate 200 MG/ML injection Commonly known as: DEPOTESTOSTERONE CYPIONATE Inject 1 cc every 7 days   Trelegy Ellipta 200-62.5-25 MCG/ACT Aepb Generic drug: Fluticasone-Umeclidin-Vilant Inhale 1 puff into the lungs daily.   triamcinolone cream 0.1 % Commonly known as: KENALOG Apply 1 Application topically 2 (two) times daily.   Trulicity 3 MG/0.5ML Sopn Generic drug: Dulaglutide Inject 4.5 mg into the skin once a week.        Allergies:  Allergies  Allergen Reactions   Glipizide Other (See Comments) and Palpitations    Shaky, feel bad Other reaction(s): Dizziness   Cephalexin Rash   Duloxetine Anxiety and Nausea Only    Family History: Family History  Problem Relation Age of Onset   Cancer Mother 69       Lung   Cancer Father        Colon   Heart disease Father    Alcohol abuse Father    Cancer Brother 73       Esophageal   Diabetes Brother    Heart disease Brother     Social History:  reports that he has been smoking cigarettes. He has a 17.00 pack-year smoking history. He has been exposed to tobacco smoke. He has never used smokeless tobacco. He reports that he does not currently use alcohol after a past usage of about 12.0 standard drinks of alcohol per week. He reports that he does not use drugs.  ROS: Pertinent ROS in HPI  Physical Exam: Constitutional:  Well nourished. Alert and oriented, No acute distress. HEENT: [place] AT, moist mucus membranes.  Trachea midline Cardiovascular: No clubbing,  cyanosis, or edema. Respiratory: Normal respiratory effort, no increased work of breathing. Neurologic: Grossly  intact, no focal deficits, moving all 4 extremities. Psychiatric: Normal mood and affect.   Laboratory Data: Urinalysis  See EPIC and HPI I have reviewed the labs.   Pertinent Imaging:  07/31/22 11:02  Scan Result 0 ml    Assessment & Plan:    1. Testosterone deficiency  -testosterone levels pending -hemoglobin/hematocrit pending  -continue injections to 1.0 cc every 7 days  2. BPH with LUTS -PSA stable -bothersome symptoms of post-void dribbling -continue conservative management, avoiding bladder irritants and timed voiding's -increase tamsulosin 0.4 mg to twice daily  3. Erectile dysfunction:    -continue tadalafil 5 mg, on-demand-dosing  4.  Nephrolithiasis -No issues with renal colic or passage of fragments since last visit   Return for pending lab results .  These notes generated with voice recognition software. I apologize for typographical errors.  Cloretta Ned  [facility] [address]  [address] [place], [place] [address] [phone]

## 2022-07-31 ENCOUNTER — Ambulatory Visit (INDEPENDENT_AMBULATORY_CARE_PROVIDER_SITE_OTHER): Payer: 59 | Admitting: Urology

## 2022-07-31 DIAGNOSIS — E291 Testicular hypofunction: Secondary | ICD-10-CM | POA: Diagnosis not present

## 2022-07-31 DIAGNOSIS — N529 Male erectile dysfunction, unspecified: Secondary | ICD-10-CM | POA: Diagnosis not present

## 2022-07-31 DIAGNOSIS — N401 Enlarged prostate with lower urinary tract symptoms: Secondary | ICD-10-CM | POA: Diagnosis not present

## 2022-07-31 DIAGNOSIS — N200 Calculus of kidney: Secondary | ICD-10-CM

## 2022-07-31 DIAGNOSIS — Z87442 Personal history of urinary calculi: Secondary | ICD-10-CM

## 2022-07-31 DIAGNOSIS — N138 Other obstructive and reflux uropathy: Secondary | ICD-10-CM

## 2022-07-31 LAB — URINALYSIS, COMPLETE
Bilirubin, UA: NEGATIVE
Ketones, UA: NEGATIVE
Leukocytes,UA: NEGATIVE
Nitrite, UA: NEGATIVE
Protein,UA: NEGATIVE
RBC, UA: NEGATIVE
Specific Gravity, UA: 1.02 (ref 1.005–1.030)
Urobilinogen, Ur: 1 mg/dL (ref 0.2–1.0)
pH, UA: 8.5 — ABNORMAL HIGH (ref 5.0–7.5)

## 2022-07-31 LAB — MICROSCOPIC EXAMINATION
Bacteria, UA: NONE SEEN
Epithelial Cells (non renal): NONE SEEN /hpf (ref 0–10)
RBC, Urine: NONE SEEN /hpf (ref 0–2)

## 2022-07-31 LAB — BLADDER SCAN AMB NON-IMAGING: Scan Result: 0

## 2022-08-01 ENCOUNTER — Encounter: Payer: Self-pay | Admitting: Urology

## 2022-08-01 LAB — HEMOGLOBIN AND HEMATOCRIT, BLOOD
Hematocrit: 41.7 % (ref 37.5–51.0)
Hemoglobin: 12.4 g/dL — ABNORMAL LOW (ref 13.0–17.7)

## 2022-08-01 LAB — TESTOSTERONE: Testosterone: 855 ng/dL (ref 264–916)

## 2022-08-09 ENCOUNTER — Encounter: Payer: Self-pay | Admitting: Nurse Practitioner

## 2022-08-11 MED ORDER — HYDROCHLOROTHIAZIDE 25 MG PO TABS: 25.0000 mg | ORAL_TABLET | Freq: Every day | ORAL | 1 refills | Status: DC

## 2022-08-19 LAB — HM DIABETES EYE EXAM

## 2022-08-23 NOTE — Progress Notes (Unsigned)
PROVIDER NOTE: Information contained herein reflects review and annotations entered in association with encounter. Interpretation of such information and data should be left to medically-trained personnel. Information provided to patient can be located elsewhere in the medical record under "Patient Instructions". Document created using STT-dictation technology, any transcriptional errors that may result from process are unintentional.    Patient: Darren Allen  Service Category: E/M  Provider: Oswaldo Done, MD  DOB: [birth year]  DOS: 08/24/2022  Referring Provider: Larae Grooms, NP  MRN: [id number]  Specialty: Interventional Pain Management  PCP: Larae Grooms, NP  Type: Established Patient  Setting: Ambulatory outpatient    Location: Office  Delivery: Face-to-face     HPI  Mr. DERICO MITTON, a 57 y.o. year old male, is here today because of his No primary diagnosis found.. Mr. Minehart primary complain today is No chief complaint on file.  Pertinent problems: Mr. Visconti has Ankle fracture; Neuropathy; Diabetic peripheral neuropathy (HCC); Gout; Chronic ankle pain (Bilateral); Chronic low back pain (1ry area of Pain) (Bilateral) (R>L) w/o sciatica; Other acquired hammer toe; Chronic pain syndrome; Lumbar facet syndrome; Lumbosacral radiculopathy at S1 (Left); Chronic feet pain (2ry area of Pain) (Bilateral); Chronic ankle pain (Left); Abnormal NCS (nerve conduction studies) (02/20/2020); Abnormal MRI, lumbar spine (03/11/2020); DDD (degenerative disc disease), lumbosacral; Lumbosacral lateral recess stenosis (Left: L5-S1); Chronic low back pain (Bilateral) w/ sciatica (Left); and Abnormal MRI, thoracic spine (07/05/2020) on their pertinent problem list. Pain Assessment: Severity of   is reported as a  /10. Location:    / . Onset:  . Quality:  . Timing:  . Modifying factor(s):  Marland Kitchen Vitals:  vitals were not taken for this visit.  BMI: Estimated body mass index is 29.53 kg/m as calculated  from the following:   Height as of 07/04/22: 6\' 2"  (1.88 m).   Weight as of 07/04/22: 230 lb (104.3 kg). Last encounter: 06/09/2022. Last procedure: 05/26/2022.  Reason for encounter: medication management. ***  Pharmacotherapy Assessment  Analgesic: Hydrocodone/APAP 5/325 tablet, 1 tab p.o. twice daily (#60) MME/day: 10 mg/day   Monitoring: [place] PMP: PDMP reviewed during this encounter.       Pharmacotherapy: No side-effects or adverse reactions reported. Compliance: No problems identified. Effectiveness: Clinically acceptable.  No notes on file  No results found for: "CBDTHCR" No results found for: "D8THCCBX" No results found for: "D9THCCBX"  UDS:  Summary  Date Value Ref Range Status  09/08/2021 Note  Final    Comment:    ==================================================================== Compliance Drug Analysis, Ur ==================================================================== Test                             Result       Flag       Units  Drug Present and Declared for Prescription Verification   Hydrocodone                    697          EXPECTED   ng/mg creat   Dihydrocodeine                 36           EXPECTED   ng/mg creat   Norhydrocodone                 1265         EXPECTED   ng/mg creat    Sources of hydrocodone include scheduled prescription medications.  Dihydrocodeine and norhydrocodone are expected metabolites of    hydrocodone. Dihydrocodeine is also available as a scheduled    prescription medication.    Gabapentin                     PRESENT      EXPECTED   Amitriptyline                  PRESENT      EXPECTED   Nortriptyline                  PRESENT      EXPECTED    Nortriptyline is an expected metabolite of amitriptyline.    Acetaminophen                  PRESENT      EXPECTED  Drug Absent but Declared for Prescription Verification   Metoprolol                     Not Detected  UNEXPECTED ==================================================================== Test                      Result    Flag   Units      Ref Range   Creatinine              284              mg/dL      >=29 ==================================================================== Declared Medications:  The flagging and interpretation on this report are based on the  following declared medications.  Unexpected results may arise from  inaccuracies in the declared medications.   **Note: The testing scope of this panel includes these medications:   Amitriptyline (Elavil)  Gabapentin (Neurontin)  Hydrocodone (Norco)  Metoprolol   **Note: The testing scope of this panel does not include small to  moderate amounts of these reported medications:   Acetaminophen (Tylenol)  Acetaminophen (Norco)   **Note: The testing scope of this panel does not include the  following reported medications:   Albuterol (Proair HFA)  Fluticasone (Flonase)  Fluticasone (Trelegy)  Hydrochlorothiazide  Insulin (Humulin)  Iron  Levalbuterol (Xopenex)  Lisinopril (Zestril)  Metformin  Montelukast  Pantoprazole (Protonix)  Rosuvastatin (Crestor)  Testosterone  Triamcinolone (Kenalog)  Umeclidinium (Trelegy)  Vilanterol (Trelegy)  Vitamin C ==================================================================== For clinical consultation, please call [phone]. ====================================================================       ROS  Constitutional: Denies any fever or chills Gastrointestinal: No reported hemesis, hematochezia, vomiting, or acute GI distress Musculoskeletal: Denies any acute onset joint swelling, redness, loss of ROM, or weakness Neurological: No reported episodes of acute onset apraxia, aphasia, dysarthria, agnosia, amnesia, paralysis, loss of coordination, or loss of consciousness  Medication Review  Dulaglutide, EPINEPHrine, Fluticasone-Umeclidin-Vilant,  HYDROcodone-acetaminophen, Magnesium Oxide -Mg Supplement, acetaminophen, albuterol, amitriptyline, ascorbic acid, ferrous sulfate, fluticasone, gabapentin, hydrochlorothiazide, insulin isophane & regular human KwikPen, ipratropium-albuterol, lisinopril, meloxicam, metFORMIN, montelukast, naloxone, ondansetron, predniSONE, rosuvastatin, sulfamethoxazole-trimethoprim, tadalafil, tamsulosin, testosterone cypionate, and triamcinolone cream  History Review  Allergy: Mr. Sedlar is allergic to glipizide, cephalexin, and duloxetine. Drug: Mr. Nulf  reports no history of drug use. Alcohol:  reports that he does not currently use alcohol after a past usage of about 12.0 standard drinks of alcohol per week. Tobacco:  reports that he has been smoking cigarettes. He has a 17.00 pack-year smoking history. He has been exposed to tobacco smoke. He has never used smokeless tobacco. Social: Mr. Zwicker  reports that he has  been smoking cigarettes. He has a 17.00 pack-year smoking history. He has been exposed to tobacco smoke. He has never used smokeless tobacco. He reports that he does not currently use alcohol after a past usage of about 12.0 standard drinks of alcohol per week. He reports that he does not use drugs. Medical:  has a past medical history of Allergy, Calculus of kidney (02/04/2015), COPD (chronic obstructive pulmonary disease) (HCC), Diabetes mellitus without complication (HCC), Hypertension, and Neuropathy. Surgical: Mr. Zappone  has a past surgical history that includes Appendectomy; Incision and drainage perirectal abscess (N/A, 02/04/2015); Rectal exam under anesthesia (02/04/2015); ORIF ankle fracture (Left, 03/23/2017); Syndesmosis repair (Left, 03/23/2017); Colonoscopy with propofol (N/A, 12/31/2021); Esophagogastroduodenoscopy (N/A, 12/31/2021); Kidney stone surgery (Right); lung mass removal (N/A); Xi robotic assisted paraesophageal hernia repair (N/A, 01/20/2022); Insertion of mesh (01/20/2022); and  Umbilical hernia repair (01/20/2022). Family: family history includes Alcohol abuse in his father; Cancer in his father; Cancer (age of onset: 15) in his brother; Cancer (age of onset: 37) in his mother; Diabetes in his brother; Heart disease in his brother and father.  Laboratory Chemistry Profile   Renal Lab Results  Component Value Date   BUN 16 05/22/2022   CREATININE 1.22 05/22/2022   BCR 13 05/22/2022   GFRAA 59 (L) 12/07/2019   GFRNONAA >60 02/15/2022    Hepatic Lab Results  Component Value Date   AST 17 05/22/2022   ALT 21 05/22/2022   ALBUMIN 4.5 05/22/2022   ALKPHOS 84 05/22/2022   AMYLASE 93 02/04/2022   LIPASE 115 (H) 02/04/2022    Electrolytes Lab Results  Component Value Date   NA 137 05/22/2022   K 4.3 05/22/2022   CL 99 05/22/2022   CALCIUM 9.9 05/22/2022   MG 1.8 01/25/2022   PHOS 2.3 (L) 01/23/2022    Bone Lab Results  Component Value Date   25OHVITD1 44 09/08/2021   25OHVITD2 <1.0 09/08/2021   25OHVITD3 43 09/08/2021   TESTOSTERONE 855 07/31/2022    Inflammation (CRP: Acute Phase) (ESR: Chronic Phase) Lab Results  Component Value Date   CRP 1.8 (H) 09/08/2021   ESRSEDRATE 17 09/08/2021   LATICACIDVEN 1.6 02/03/2022         Note: Above Lab results reviewed.  Recent Imaging Review  MR LUMBAR SPINE WO CONTRAST CLINICAL DATA:  Low back pain. Symptoms persist with greater than 6 weeks of treatment. Low back pain radiating to the lower extremities, left worse than right.  EXAM: MRI LUMBAR SPINE WITHOUT CONTRAST  TECHNIQUE: Multiplanar, multisequence MR imaging of the lumbar spine was performed. No intravenous contrast was administered.  COMPARISON:  Radiography 09/08/2021.  MRI 03/11/2020.  FINDINGS: Segmentation: 5 lumbar type vertebral bodies as numbered previously.  Alignment:  Normal  Vertebrae:  No fracture or focal bone lesion.  Conus medullaris and cauda equina: Conus extends to the L1 level. Conus and cauda equina appear  normal.  Paraspinal and other soft tissues: Negative  Disc levels:  T12-L1 and L1-2: Normal  L2-3: Disc degeneration and bulging without compressive narrowing of the canal or foramina. Slight prominence in the left extraforaminal region but without visible affect upon the adjacent L2 nerve.  L3-4: Normal interspace.  L4-5: Minimal disc bulge.  No canal or foraminal stenosis.  L5-S1: Disc degeneration with shallow disc protrusion slightly more prominent towards the left. This is adjacent to the left S1 nerve as it buds from the thecal sac, but definite neural compression is not established. Finding may be very minimally worse when compared to  2021.  IMPRESSION: 1. L5-S1: Disc degeneration with a shallow disc protrusion more prominent towards the left. This is adjacent to the left S1 nerve as it buds from the thecal sac, but definite neural compression is not established. Finding may be very minimally worse when compared to 2021. 2. L2-3: Disc bulge without compressive narrowing of the canal or foramina. Slight prominence in the left extraforaminal region but without visible affect upon the adjacent L2 nerve.  Electronically Signed   By: Paulina Fusi M.D.   On: 06/20/2022 13:22 Note: Reviewed        Physical Exam  General appearance: Well nourished, well developed, and well hydrated. In no apparent acute distress Mental status: Alert, oriented x 3 (person, place, & time)       Respiratory: No evidence of acute respiratory distress Eyes: PERLA Vitals: There were no vitals taken for this visit. BMI: Estimated body mass index is 29.53 kg/m as calculated from the following:   Height as of 07/04/22: 6\' 2"  (1.88 m).   Weight as of 07/04/22: 230 lb (104.3 kg). Ideal: Patient weight not recorded  Assessment   Diagnosis Status  No diagnosis found. Controlled Controlled Controlled   Updated Problems: No problems updated.  Plan of Care  Problem-specific:  No  problem-specific Assessment & Plan notes found for this encounter.  Mr. CRUZ MINGUS has a current medication list which includes the following long-term medication(s): ferrous sulfate, fluticasone, hydrochlorothiazide, hydrocodone-acetaminophen, hydrocodone-acetaminophen, hydrocodone-acetaminophen, humulin 70/30 kwikpen, lisinopril, metformin, montelukast, rosuvastatin, tadalafil, and testosterone cypionate.  Pharmacotherapy (Medications Ordered): No orders of the defined types were placed in this encounter.  Orders:  No orders of the defined types were placed in this encounter.  Follow-up plan:   No follow-ups on file.      Interventional Therapies  Risk Factors  Considerations:   WNL   Planned  Pending:   Diagnostic lumbar MRI ordered today (06/09/2022)   Under consideration:   Therapeutic left RACZ procedure #1  Diagnostic bilateral lumbar facet MBB #1  Diagnostic/therapeutic left L5 & S1 TFESI #1  Possible spinal cord stimulator trial  Therapeutic left L5-S1 percutaneous discectomy with "[employer] Dekompressor" system    Completed:   Diagnostic midline to left caudal ESI x1 (04/02/2022) (100/100/100/LBP:100/LEP:100)  Diagnostic left L5-S1 LESI x2 (05/26/2022) (1st:100/100/100/LBP:85  LEP:100) (2nd: 01[birth year]59/LBP:60LLEP:100) Therapeutic bilateral Qutenza neurolytic treatment x1 (12/23/2021)  (09/08/2021 & 10/29/2021) referral to physical therapy for evaluation and treatment of low back pain.   Completed by other providers:   EMG/PNCV of lower extremity (02/20/2020) by Dr. Cristopher Peru (generalized sensorimotor peripheral neuropathy; superimposed left S1 radiculopathy)   Therapeutic  Palliative (PRN) options:   None established       Recent Visits Date Type Provider Dept  06/09/22 Office Visit Delano Metz, MD [facility]  05/26/22 Procedure visit Delano Metz, MD [facility]  Showing recent visits within past 90 days and meeting all  other requirements Future Appointments Date Type Provider Dept  08/24/22 Appointment Delano Metz, MD [facility]  Showing future appointments within next 90 days and meeting all other requirements  I discussed the assessment and treatment plan with the patient. The patient was provided an opportunity to ask questions and all were answered. The patient agreed with the plan and demonstrated an understanding of the instructions.  Patient advised to call back or seek an in-person evaluation if the symptoms or condition worsens.  Duration of encounter: *** minutes.  Total time on encounter, as per AMA guidelines included  both the face-to-face and non-face-to-face time personally spent by the physician and/or other qualified health care professional(s) on the day of the encounter (includes time in activities that require the physician or other qualified health care professional and does not include time in activities normally performed by clinical staff). Physician's time may include the following activities when performed: Preparing to see the patient (e.g., pre-charting review of records, searching for previously ordered imaging, lab work, and nerve conduction tests) Review of prior analgesic pharmacotherapies. Reviewing PMP Interpreting ordered tests (e.g., lab work, imaging, nerve conduction tests) Performing post-procedure evaluations, including interpretation of diagnostic procedures Obtaining and/or reviewing separately obtained history Performing a medically appropriate examination and/or evaluation Counseling and educating the patient/family/caregiver Ordering medications, tests, or procedures Referring and communicating with other health care professionals (when not separately reported) Documenting clinical information in the electronic or other health record Independently interpreting results (not separately reported) and communicating results to the patient/  family/caregiver Care coordination (not separately reported)  Note by: Oswaldo Done, MD Date: 08/24/2022; Time: 6:51 PM

## 2022-08-24 ENCOUNTER — Encounter: Payer: Self-pay | Admitting: Pain Medicine

## 2022-08-24 ENCOUNTER — Ambulatory Visit: Payer: 59 | Attending: Pain Medicine | Admitting: Pain Medicine

## 2022-08-24 ENCOUNTER — Ambulatory Visit: Payer: 59 | Admitting: Nurse Practitioner

## 2022-08-24 VITALS — BP 143/86 | HR 107 | Temp 98.6°F | Resp 16 | Ht 74.0 in | Wt 240.0 lb

## 2022-08-24 DIAGNOSIS — M51379 Other intervertebral disc degeneration, lumbosacral region without mention of lumbar back pain or lower extremity pain: Secondary | ICD-10-CM

## 2022-08-24 DIAGNOSIS — R937 Abnormal findings on diagnostic imaging of other parts of musculoskeletal system: Secondary | ICD-10-CM

## 2022-08-24 DIAGNOSIS — G8929 Other chronic pain: Secondary | ICD-10-CM

## 2022-08-24 DIAGNOSIS — M545 Low back pain, unspecified: Secondary | ICD-10-CM | POA: Insufficient documentation

## 2022-08-24 DIAGNOSIS — M4807 Spinal stenosis, lumbosacral region: Secondary | ICD-10-CM | POA: Diagnosis present

## 2022-08-24 DIAGNOSIS — M4727 Other spondylosis with radiculopathy, lumbosacral region: Secondary | ICD-10-CM | POA: Diagnosis not present

## 2022-08-24 DIAGNOSIS — M5417 Radiculopathy, lumbosacral region: Secondary | ICD-10-CM

## 2022-08-24 DIAGNOSIS — M47816 Spondylosis without myelopathy or radiculopathy, lumbar region: Secondary | ICD-10-CM | POA: Diagnosis present

## 2022-08-24 DIAGNOSIS — Z79891 Long term (current) use of opiate analgesic: Secondary | ICD-10-CM | POA: Diagnosis present

## 2022-08-24 DIAGNOSIS — G894 Chronic pain syndrome: Secondary | ICD-10-CM | POA: Diagnosis present

## 2022-08-24 DIAGNOSIS — Z79899 Other long term (current) drug therapy: Secondary | ICD-10-CM

## 2022-08-24 DIAGNOSIS — M5137 Other intervertebral disc degeneration, lumbosacral region: Secondary | ICD-10-CM | POA: Insufficient documentation

## 2022-08-24 MED ORDER — HYDROCODONE-ACETAMINOPHEN 5-325 MG PO TABS
1.0000 | ORAL_TABLET | Freq: Two times a day (BID) | ORAL | 0 refills | Status: DC | PRN
Start: 2022-09-24 — End: 2022-11-15

## 2022-08-24 MED ORDER — HYDROCODONE-ACETAMINOPHEN 5-325 MG PO TABS
1.0000 | ORAL_TABLET | Freq: Two times a day (BID) | ORAL | 0 refills | Status: DC | PRN
Start: 2022-08-25 — End: 2022-11-15

## 2022-08-24 MED ORDER — HYDROCODONE-ACETAMINOPHEN 5-325 MG PO TABS
1.0000 | ORAL_TABLET | Freq: Two times a day (BID) | ORAL | 0 refills | Status: DC | PRN
Start: 2022-10-24 — End: 2022-11-15

## 2022-08-24 NOTE — Progress Notes (Signed)
Nursing Pain Medication Assessment:  Safety precautions to be maintained throughout the outpatient stay will include: orient to surroundings, keep bed in low position, maintain call bell within reach at all times, provide assistance with transfer out of bed and ambulation.  Medication Inspection Compliance: Pill count conducted under aseptic conditions, in front of the patient. Neither the pills nor the bottle was removed from the patient's sight at any time. Once count was completed pills were immediately returned to the patient in their original bottle.  Medication: Hydrocodone/APAP Pill/Patch Count:  13  of 60 pills remain Pill/Patch Appearance: Markings consistent with prescribed medication Bottle Appearance: Standard pharmacy container. Clearly labeled. Filled Date: 05 / 13 / 2024 Last Medication intake:  Today

## 2022-08-24 NOTE — Patient Instructions (Addendum)
______________________________________________________________________  Procedure instructions  Do not eat or drink fluids (other than water) for 6 hours before your procedure  No water for 2 hours before your procedure  Take your blood pressure medicine with a sip of water  Arrive 30 minutes before your appointment  Carefully read the "Preparing for your procedure" detailed instructions  If you have questions call us at [phone]  _____________________________________________________________________    ______________________________________________________________________  Preparing for your procedure  Appointments: If you think you may not be able to keep your appointment, call 24-48 hours in advance to cancel. We need time to make it available to others.  During your procedure appointment there will be: No Prescription Refills. No disability issues to discussed. No medication changes or discussions.  Instructions: Food intake: Avoid eating anything solid for at least 8 hours prior to your procedure. Clear liquid intake: You may take clear liquids such as water up to 2 hours prior to your procedure. (No carbonated drinks. No soda.) Transportation: Unless otherwise stated by your physician, bring a driver. Morning Medicines: Except for blood thinners, take all of your other morning medications with a sip of water. Make sure to take your heart and blood pressure medicines. If your blood pressure's lower number is above 100, the case will be rescheduled. Blood thinners: Make sure to stop your blood thinners as instructed.  If you take a blood thinner, but were not instructed to stop it, call our office [phone] and ask to talk to a nurse. Not stopping a blood thinner prior to certain procedures could lead to serious complications. Diabetics on insulin: Notify the staff so that you can be scheduled 1st case in the morning. If your diabetes requires high dose insulin,  take only  of your normal insulin dose the morning of the procedure and notify the staff that you have done so. Preventing infections: Shower with an antibacterial soap the morning of your procedure.  Build-up your immune system: Take 1000 mg of Vitamin C with every meal (3 times a day) the day prior to your procedure. Antibiotics: Inform the nursing staff if you are taking any antibiotics or if you have any conditions that may require antibiotics prior to procedures. (Example: recent joint implants)   Pregnancy: If you are pregnant make sure to notify the nursing staff. Not doing so may result in injury to the fetus, including death.  Sickness: If you have a cold, fever, or any active infections, call and cancel or reschedule your procedure. Receiving steroids while having an infection may result in complications. Arrival: You must be in the facility at least 30 minutes prior to your scheduled procedure. Tardiness: Your scheduled time is also the cutoff time. If you do not arrive at least 15 minutes prior to your procedure, you will be rescheduled.  Children: Do not bring any children with you. Make arrangements to keep them home. Dress appropriately: There is always a possibility that your clothing may get soiled. Avoid long dresses. Valuables: Do not bring any jewelry or valuables.  Reasons to call and reschedule or cancel your procedure: (Following these recommendations will minimize the risk of a serious complication.) Surgeries: Avoid having procedures within 2 weeks of any surgery. (Avoid for 2 weeks before or after any surgery). Flu Shots: Avoid having procedures within 2 weeks of a flu shots or . (Avoid for 2 weeks before or after immunizations). Barium: Avoid having a procedure within 7-10 days after having had a radiological study involving the use of   radiological contrast. (Myelograms, Barium swallow or enema study). Heart attacks: Avoid any elective procedures or surgeries for the  initial 6 months after a "Myocardial Infarction" (Heart Attack). Blood thinners: It is imperative that you stop these medications before procedures. Let us know if you if you take any blood thinner.  Infection: Avoid procedures during or within two weeks of an infection (including chest colds or gastrointestinal problems). Symptoms associated with infections include: Localized redness, fever, chills, night sweats or profuse sweating, burning sensation when voiding, cough, congestion, stuffiness, runny nose, sore throat, diarrhea, nausea, vomiting, cold or Flu symptoms, recent or current infections. It is specially important if the infection is over the area that we intend to treat. Heart and lung problems: Symptoms that may suggest an active cardiopulmonary problem include: cough, chest pain, breathing difficulties or shortness of breath, dizziness, ankle swelling, uncontrolled high or unusually low blood pressure, and/or palpitations. If you are experiencing any of these symptoms, cancel your procedure and contact your primary care physician for an evaluation.  Remember:  Regular Business hours are:  Monday to Thursday 8:00 AM to 4:00 PM  Provider's Schedule: Haidan Nhan, MD:  Procedure days: Tuesday and Thursday 7:30 AM to 4:00 PM  Bilal Lateef, MD:  Procedure days: Monday and Wednesday 7:30 AM to 4:00 PM  ______________________________________________________________________    ____________________________________________________________________________________________  General Risks and Possible Complications  Patient Responsibilities: It is important that you read this as it is part of your informed consent. It is our duty to inform you of the risks and possible complications associated with treatments offered to you. It is your responsibility as a patient to read this and to ask questions about anything that is not clear or that you believe was not covered in this  document.  Patient's Rights: You have the right to refuse treatment. You also have the right to change your mind, even after initially having agreed to have the treatment done. However, under this last option, if you wait until the last second to change your mind, you may be charged for the materials used up to that point.  Introduction: Medicine is not an exact science. Everything in Medicine, including the lack of treatment(s), carries the potential for danger, harm, or loss (which is by definition: Risk). In Medicine, a complication is a secondary problem, condition, or disease that can aggravate an already existing one. All treatments carry the risk of possible complications. The fact that a side effects or complications occurs, does not imply that the treatment was conducted incorrectly. It must be clearly understood that these can happen even when everything is done following the highest safety standards.  No treatment: You can choose not to proceed with the proposed treatment alternative. The "PRO(s)" would include: avoiding the risk of complications associated with the therapy. The "CON(s)" would include: not getting any of the treatment benefits. These benefits fall under one of three categories: diagnostic; therapeutic; and/or palliative. Diagnostic benefits include: getting information which can ultimately lead to improvement of the disease or symptom(s). Therapeutic benefits are those associated with the successful treatment of the disease. Finally, palliative benefits are those related to the decrease of the primary symptoms, without necessarily curing the condition (example: decreasing the pain from a flare-up of a chronic condition, such as incurable terminal cancer).  General Risks and Complications: These are associated to most interventional treatments. They can occur alone, or in combination. They fall under one of the following six (6) categories: no benefit or worsening of symptoms;    bleeding; infection; nerve damage; allergic reactions; and/or death. No benefits or worsening of symptoms: In Medicine there are no guarantees, only probabilities. No healthcare provider can ever guarantee that a medical treatment will work, they can only state the probability that it may. Furthermore, there is always the possibility that the condition may worsen, either directly, or indirectly, as a consequence of the treatment. Bleeding: This is more common if the patient is taking a blood thinner, either prescription or over the counter (example: Goody Powders, Fish oil, Aspirin, Garlic, etc.), or if suffering a condition associated with impaired coagulation (example: Hemophilia, cirrhosis of the liver, low platelet counts, etc.). However, even if you do not have one on these, it can still happen. If you have any of these conditions, or take one of these drugs, make sure to notify your treating physician. Infection: This is more common in patients with a compromised immune system, either due to disease (example: diabetes, cancer, human immunodeficiency virus [HIV], etc.), or due to medications or treatments (example: therapies used to treat cancer and rheumatological diseases). However, even if you do not have one on these, it can still happen. If you have any of these conditions, or take one of these drugs, make sure to notify your treating physician. Nerve Damage: This is more common when the treatment is an invasive one, but it can also happen with the use of medications, such as those used in the treatment of cancer. The damage can occur to small secondary nerves, or to large primary ones, such as those in the spinal cord and brain. This damage may be temporary or permanent and it may lead to impairments that can range from temporary numbness to permanent paralysis and/or brain death. Allergic Reactions: Any time a substance or material comes in contact with our body, there is the possibility of an  allergic reaction. These can range from a mild skin rash (contact dermatitis) to a severe systemic reaction (anaphylactic reaction), which can result in death. Death: In general, any medical intervention can result in death, most of the time due to an unforeseen complication. ____________________________________________________________________________________________   ____________________________________________________________________________________________  Opioid Pain Medication Update  To: All patients taking opioid pain medications. (I.e.: hydrocodone, hydromorphone, oxycodone, oxymorphone, morphine, codeine, methadone, tapentadol, tramadol, buprenorphine, fentanyl, etc.)  Re: Updated review of side effects and adverse reactions of opioid analgesics, as well as new information about long term effects of this class of medications.  Direct risks of long-term opioid therapy are not limited to opioid addiction and overdose. Potential medical risks include serious fractures, breathing problems during sleep, hyperalgesia, immunosuppression, chronic constipation, bowel obstruction, myocardial infarction, and tooth decay secondary to xerostomia.  Unpredictable adverse effects that can occur even if you take your medication correctly: Cognitive impairment, respiratory depression, and death. Most people think that if they take their medication "correctly", and "as instructed", that they will be safe. Nothing could be farther from the truth. In reality, a significant amount of recorded deaths associated with the use of opioids has occurred in individuals that had taken the medication for a long time, and were taking their medication correctly. The following are examples of how this can happen: Patient taking his/her medication for a long time, as instructed, without any side effects, is given a certain antibiotic or another unrelated medication, which in turn triggers a "Drug-to-drug interaction"  leading to disorientation, cognitive impairment, impaired reflexes, respiratory depression or an untoward event leading to serious bodily harm or injury, including death.  Patient taking his/her   medication for a long time, as instructed, without any side effects, develops an acute impairment of liver and/or kidney function. This will lead to a rapid inability of the body to breakdown and eliminate their pain medication, which will result in effects similar to an "overdose", but with the same medicine and dose that they had always taken. This again may lead to disorientation, cognitive impairment, impaired reflexes, respiratory depression or an untoward event leading to serious bodily harm or injury, including death.  A similar problem will occur with patients as they grow older and their liver and kidney function begins to decrease as part of the aging process.  Background information: Historically, the original case for using long-term opioid therapy to treat chronic noncancer pain was based on safety assumptions that subsequent experience has called into question. In 1996, the American Pain Society and the American Academy of Pain Medicine issued a consensus statement supporting long-term opioid therapy. This statement acknowledged the dangers of opioid prescribing but concluded that the risk for addiction was low; respiratory depression induced by opioids was short-lived, occurred mainly in opioid-naive patients, and was antagonized by pain; tolerance was not a common problem; and efforts to control diversion should not constrain opioid prescribing. This has now proven to be wrong. Experience regarding the risks for opioid addiction, misuse, and overdose in community practice has failed to support these assumptions.  According to the Centers for Disease Control and Prevention, fatal overdoses involving opioid analgesics have increased sharply over the past decade. Currently, more than 96,700 people die  from drug overdoses every year. Opioids are a factor in 7 out of every 10 overdose deaths. Deaths from drug overdose have surpassed motor vehicle accidents as the leading cause of death for individuals between the ages of 35 and 54.  Clinical data suggest that neuroendocrine dysfunction may be very common in both men and women, potentially causing hypogonadism, erectile dysfunction, infertility, decreased libido, osteoporosis, and depression. Recent studies linked higher opioid dose to increased opioid-related mortality. Controlled observational studies reported that long-term opioid therapy may be associated with increased risk for cardiovascular events. Subsequent meta-analysis concluded that the safety of long-term opioid therapy in elderly patients has not been proven.   Side Effects and adverse reactions: Common side effects: Drowsiness (sedation). Dizziness. Nausea and vomiting. Constipation. Physical dependence -- Dependence often manifests with withdrawal symptoms when opioids are discontinued or decreased. Tolerance -- As you take repeated doses of opioids, you require increased medication to experience the same effect of pain relief. Respiratory depression -- This can occur in healthy people, especially with higher doses. However, people with COPD, asthma or other lung conditions may be even more susceptible to fatal respiratory impairment.  Uncommon side effects: An increased sensitivity to feeling pain and extreme response to pain (hyperalgesia). Chronic use of opioids can lead to this. Delayed gastric emptying (the process by which the contents of your stomach are moved into your small intestine). Muscle rigidity. Immune system and hormonal dysfunction. Quick, involuntary muscle jerks (myoclonus). Arrhythmia. Itchy skin (pruritus). Dry mouth (xerostomia).  Long-term side effects: Chronic constipation. Sleep-disordered breathing (SDB). Increased risk of bone  fractures. Hypothalamic-pituitary-adrenal dysregulation. Increased risk of overdose.  RISKS: Fractures and Falls:  Opioids increase the risk and incidence of falls. This is of particular importance in elderly patients.  Endocrine System:  Long-term administration is associated with endocrine abnormalities (endocrinopathies). (Also known as Opioid-induced Endocrinopathy) Influences on both the hypothalamic-pituitary-adrenal axis?and the hypothalamic-pituitary-gonadal axis have been demonstrated with consequent hypogonadism and adrenal insufficiency   in both sexes. Hypogonadism and decreased levels of dehydroepiandrosterone sulfate have been reported in men and women. Endocrine effects include: Amenorrhoea in women (abnormal absence of menstruation) Reduced libido in both sexes Decreased sexual function Erectile dysfunction in men Hypogonadisms (decreased testicular function with shrinkage of testicles) Infertility Depression and fatigue Loss of muscle mass Anxiety Depression Immune suppression Hyperalgesia Weight gain Anemia Osteoporosis Patients (particularly women of childbearing age) should avoid opioids. There is insufficient evidence to recommend routine monitoring of asymptomatic patients taking opioids in the long-term for hormonal deficiencies.  Immune System: Human studies have demonstrated that opioids have an immunomodulating effect. These effects are mediated via opioid receptors both on immune effector cells and in the central nervous system. Opioids have been demonstrated to have adverse effects on antimicrobial response and anti-tumour surveillance. Buprenorphine has been demonstrated to have no impact on immune function.  Opioid Induced Hyperalgesia: Human studies have demonstrated that prolonged use of opioids can lead to a state of abnormal pain sensitivity, sometimes called opioid induced hyperalgesia (OIH). Opioid induced hyperalgesia is not usually seen in the  absence of tolerance to opioid analgesia. Clinically, hyperalgesia may be diagnosed if the patient on long-term opioid therapy presents with increased pain. This might be qualitatively and anatomically distinct from pain related to disease progression or to breakthrough pain resulting from development of opioid tolerance. Pain associated with hyperalgesia tends to be more diffuse than the pre-existing pain and less defined in quality. Management of opioid induced hyperalgesia requires opioid dose reduction.  Cancer: Chronic opioid therapy has been associated with an increased risk of cancer among noncancer patients with chronic pain. This association was more evident in chronic strong opioid users. Chronic opioid consumption causes significant pathological changes in the small intestine and colon. Epidemiological studies have found that there is a link between opium dependence and initiation of gastrointestinal cancers. Cancer is the second leading cause of death after cardiovascular disease. Chronic use of opioids can cause multiple conditions such as GERD, immunosuppression and renal damage as well as carcinogenic effects, which are associated with the incidence of cancers.   Mortality: Long-term opioid use has been associated with increased mortality among patients with chronic non-cancer pain (CNCP).  Prescription of long-acting opioids for chronic noncancer pain was associated with a significantly increased risk of all-cause mortality, including deaths from causes other than overdose.  Reference: Von Korff M, Kolodny A, Deyo RA, Chou R. Long-term opioid therapy reconsidered. Ann Intern Med. 2011 Sep 6;155(5):325-8. doi: 10.7326/[id number]. PMID: 21893626; PMCID: PMC3280085. Bedson J, Chen Y, Ashworth J, Hayward RA, Dunn KM, Jordan KP. Risk of adverse events in patients prescribed long-term opioids: A cohort study in the [place] Clinical Practice Research D[employer]ink. Eur J Pain. 2019  May;23(5):908-922. doi: 10.1002/ejp.1357. Epub 2019 Jan 31. PMID: 30620116. Colameco S, Coren JS, Ciervo CA. Continuous opioid treatment for chronic noncancer pain: a time for moderation in prescribing. Postgrad Med. 2009 Jul;121(4):61-6. doi: 10.3810/pgm.2009.07.2032. PMID: 19641271. Chou R, Turner JA, Devine EB, Hansen RN, Sullivan SD, Blazina I, Dana T, Bougatsos C, Deyo RA. The effectiveness and risks of long-term opioid therapy for chronic pain: a systematic review for a National Institutes of Health Pathways to Prevention Workshop. Ann Intern Med. 2015 Feb 17;162(4):276-86. doi: 10.7326/M14-2559. PMID: 25581257. Warner M, Chen LH, Makuc DM. NCHS Data Brief No. 22. [place]: Centers for Disease Control and Prevention; 2009. Sep, Increase in Fatal Poisonings Involving Opioid Analgesics in the [place], 1999-2006. Song IA, Choi HR, Oh TK. Long-term opioid use and mortality in patients with chronic   non-cancer pain: Ten-year follow-up study in [place] from 2010 through 2019. EClinicalMedicine. 2022 Jul 18;51:101558. doi: 10.1016/j.eclinm.2022.101558. PMID: 35875817; PMCID: PMC9304910. Huser, W., Schubert, T., Vogelmann, T. et al. All-cause mortality in patients with long-term opioid therapy compared with non-opioid analgesics for chronic non-cancer pain: a database study. BMC Med 18, 162 (2020). https://doi.org/10.1186/[email or web address] Rashidian H, Zendehdel K, Kamangar F, Malekzadeh R, Haghdoost AA. An Ecological Study of the Association between Opiate Use and Incidence of Cancers. Addict Health. 2016 Fall;8(4):252-260. PMID: 28819556; PMCID: PMC5554805.  Our Goal: Our goal is to control your pain with means other than the use of opioid pain medications.  Our Recommendation: Talk to your physician about coming off of these medications. We can assist you with the tapering down and stopping these medicines. Based on the new information, even if you cannot completely stop the medication, a  decrease in the dose may be associated with a lesser risk. Ask for other means of controlling the pain. Decrease or eliminate those factors that significantly contribute to your pain such as smoking, obesity, and a diet heavily tilted towards "inflammatory" nutrients.  Last Updated: 05/13/2022   ____________________________________________________________________________________________     ____________________________________________________________________________________________  Transfer of Pain Medication between Pharmacies  Re: 2023 DEA Clarification on existing regulation  Published on DEA Website: November 14, 2021  Title: Revised Regulation Allows DEA-Registered Pharmacies to Transfer Electronic Prescriptions at a Patient's Request DEA Headquarters Division - Public Information Office  "Patients now have the ability to request their electronic prescription be transferred to another pharmacy without having to go back to their practitioner to initiate the request. This revised regulation went into effect on Monday, November 10, 2021.     At a patient's request, a DEA-registered retail pharmacy can now transfer an electronic prescription for a controlled substance (schedules II-V) to another DEA-registered retail pharmacy. Prior to this change, patients would have to go through their practitioner to cancel their prescription and have it re-issued to a different pharmacy. The process was taxing and time consuming for both patients and practitioners.    The Drug Enforcement Administration (DEA) published its intent to revise the process for transferring electronic prescriptions on February 02, 2020.  The final rule was published in the federal register on October 09, 2021 and went into effect 30 days later.  Under the final rule, a prescription can only be transferred once between pharmacies, and only if allowed under existing state or other applicable law. The prescription must remain in  its electronic form; may not be altered in any way; and the transfer must be communicated directly between two licensed pharmacists. It's important to note, any authorized refills transfer with the original prescription, which means the entire prescription will be filled at the same pharmacy."    REFERENCES: 1. DEA website announcement https://www.dea.gov/stories/2023/2023-11/2021-09-01/revised-regulation-allows-dea-registered-pharmacies-transfer  2. Department of Justice website  https://www.govinfo.gov/content/pkg/FR-2021-10-09/pdf/2023-15847.pdf  3. DEPARTMENT OF JUSTICE Drug Enforcement Administration 21 CFR Part 1306 [Docket No. [id number]] RIN [id number] "Transfer of Electronic Prescriptions for Schedules II-V Controlled Substances Between Pharmacies for Initial Filling"  ____________________________________________________________________________________________     _______________________________________________________________________  Medication Rules  Purpose: To inform patients, and their family members, of our medication rules and regulations.  Applies to: All patients receiving prescriptions from our practice (written or electronic).  Pharmacy of record: This is the pharmacy where your electronic prescriptions will be sent. Make sure we have the correct one.  Electronic prescriptions: In compliance with the [employer] Strengthen Opioid Misuse Prevention (STOP) Act of 2017 (Session Law 2017-74/H243), effective March 16, 2018, all controlled substances must be electronically prescribed. Written prescriptions, faxing, or calling prescriptions to a pharmacy will no longer be done.  Prescription refills: These will be provided only during in-person appointments. No medications will be renewed without a "face-to-face" evaluation with your provider. Applies to all prescriptions.  NOTE: The following applies primarily to controlled substances (Opioid* Pain Medications).    Type of encounter (visit): For patients receiving controlled substances, face-to-face visits are required. (Not an option and not up to the patient.)  Patient's responsibilities: Pain Pills: Bring all pain pills to every appointment (except for procedure appointments). Pill Bottles: Bring pills in original pharmacy bottle. Bring bottle, even if empty. Always bring the bottle of the most recent fill.  Medication refills: You are responsible for knowing and keeping track of what medications you are taking and when is it that you will need a refill. The day before your appointment: write a list of all prescriptions that need to be refilled. The day of the appointment: give the list to the admitting nurse. Prescriptions will be written only during appointments. No prescriptions will be written on procedure days. If you forget a medication: it will not be "Called in", "Faxed", or "electronically sent". You will need to get another appointment to get these prescribed. No early refills. Do not call asking to have your prescription filled early. Partial  or short prescriptions: Occasionally your pharmacy may not have enough pills to fill your prescription.  NEVER ACCEPT a partial fill or a prescription that is short of the total amount of pills that you were prescribed.  With controlled substances the law allows 72 hours for the pharmacy to complete the prescription.  If the prescription is not completed within 72 hours, the pharmacist will require a new prescription to be written. This means that you will be short on your medicine and we WILL NOT send another prescription to complete your original prescription.  Instead, request the pharmacy to send a carrier to a nearby branch to get enough medication to provide you with your full prescription. Prescription Accuracy: You are responsible for carefully inspecting your prescriptions before leaving our office. Have the discharge nurse carefully go over each  prescription with you, before taking them home. Make sure that your name is accurately spelled, that your address is correct. Check the name and dose of your medication to make sure it is accurate. Check the number of pills, and the written instructions to make sure they are clear and accurate. Make sure that you are given enough medication to last until your next medication refill appointment. Taking Medication: Take medication as prescribed. When it comes to controlled substances, taking less pills or less frequently than prescribed is permitted and encouraged. Never take more pills than instructed. Never take the medication more frequently than prescribed.  Inform other Doctors: Always inform, all of your healthcare providers, of all the medications you take. Pain Medication from other Providers: You are not allowed to accept any additional pain medication from any other Doctor or Healthcare provider. There are two exceptions to this rule. (see below) In the event that you require additional pain medication, you are responsible for notifying us, as stated below. Cough Medicine: Often these contain an opioid, such as codeine or hydrocodone. Never accept or take cough medicine containing these opioids if you are already taking an opioid* medication. The combination may cause respiratory failure and death. Medication Agreement: You are responsible for carefully reading and following our Medication Agreement. This must   be signed before receiving any prescriptions from our practice. Safely store a copy of your signed Agreement. Violations to the Agreement will result in no further prescriptions. (Additional copies of our Medication Agreement are available upon request.) Laws, Rules, & Regulations: All patients are expected to follow all Federal and State Laws, Statutes, Rules, & Regulations. Ignorance of the Laws does not constitute a valid excuse.  Illegal drugs and Controlled Substances: The use of illegal  substances (including, but not limited to marijuana and its derivatives) and/or the illegal use of any controlled substances is strictly prohibited. Violation of this rule may result in the immediate and permanent discontinuation of any and all prescriptions being written by our practice. The use of any illegal substances is prohibited. Adopted CDC guidelines & recommendations: Target dosing levels will be at or below 60 MME/day. Use of benzodiazepines** is not recommended.  Exceptions: There are only two exceptions to the rule of not receiving pain medications from other Healthcare Providers. Exception #1 (Emergencies): In the event of an emergency (i.e.: accident requiring emergency care), you are allowed to receive additional pain medication. However, you are responsible for: As soon as you are able, call our office [phone], at any time of the day or night, and leave a message stating your name, the date and nature of the emergency, and the name and dose of the medication prescribed. In the event that your call is answered by a member of our staff, make sure to document and save the date, time, and the name of the person that took your information.  Exception #2 (Planned Surgery): In the event that you are scheduled by another doctor or dentist to have any type of surgery or procedure, you are allowed (for a period no longer than 30 days), to receive additional pain medication, for the acute post-op pain. However, in this case, you are responsible for picking up a copy of our "Post-op Pain Management for Surgeons" handout, and giving it to your surgeon or dentist. This document is available at our office, and does not require an appointment to obtain it. Simply go to our office during business hours (Monday-Thursday from 8:00 AM to 4:00 PM) (Friday 8:00 AM to 12:00 Noon) or if you have a scheduled appointment with us, prior to your surgery, and ask for it by name. In addition, you are responsible for:  calling our office [phone], at any time of the day or night, and leaving a message stating your name, name of your surgeon, type of surgery, and date of procedure or surgery. Failure to comply with your responsibilities may result in termination of therapy involving the controlled substances. Medication Agreement Violation. Following the above rules, including your responsibilities will help you in avoiding a Medication Agreement Violation ("Breaking your Pain Medication Contract").  Consequences:  Not following the above rules may result in permanent discontinuation of medication prescription therapy.  *Opioid medications include: morphine, codeine, oxycodone, oxymorphone, hydrocodone, hydromorphone, meperidine, tramadol, tapentadol, buprenorphine, fentanyl, methadone. **Benzodiazepine medications include: diazepam (Valium), alprazolam (Xanax), clonazepam (Klonopine), lorazepam (Ativan), clorazepate (Tranxene), chlordiazepoxide (Librium), estazolam (Prosom), oxazepam (Serax), temazepam (Restoril), triazolam (Halcion) (Last updated: 01/06/2022) ______________________________________________________________________    ______________________________________________________________________  Medication Recommendations and Reminders  Applies to: All patients receiving prescriptions (written and/or electronic).  Medication Rules & Regulations: You are responsible for reading, knowing, and following our "Medication Rules" document. These exist for your safety and that of others. They are not flexible and neither are we. Dismissing or ignoring them is an act   of "non-compliance" that may result in complete and irreversible termination of such medication therapy. For safety reasons, "non-compliance" will not be tolerated. As with the [place] fundamental legal principle of "ignorance of the law is no defense", we will accept no excuses for not having read and knowing the content of documents provided to  you by our practice.  Pharmacy of record:  Definition: This is the pharmacy where your electronic prescriptions will be sent.  We do not endorse any particular pharmacy. It is up to you and your insurance to decide what pharmacy to use.  We do not restrict you in your choice of pharmacy. However, once we write for your prescriptions, we will NOT be re-sending more prescriptions to fix restricted supply problems created by your pharmacy, or your insurance.  The pharmacy listed in the electronic medical record should be the one where you want electronic prescriptions to be sent. If you choose to change pharmacy, simply notify our nursing staff. Changes will be made only during your regular appointments and not over the phone.  Recommendations: Keep all of your pain medications in a safe place, under lock and key, even if you live alone. We will NOT replace lost, stolen, or damaged medication. We do not accept "Police Reports" as proof of medications having been stolen. After you fill your prescription, take 1 week's worth of pills and put them away in a safe place. You should keep a separate, properly labeled bottle for this purpose. The remainder should be kept in the original bottle. Use this as your primary supply, until it runs out. Once it's gone, then you know that you have 1 week's worth of medicine, and it is time to come in for a prescription refill. If you do this correctly, it is unlikely that you will ever run out of medicine. To make sure that the above recommendation works, it is very important that you make sure your medication refill appointments are scheduled at least 1 week before you run out of medicine. To do this in an effective manner, make sure that you do not leave the office without scheduling your next medication management appointment. Always ask the nursing staff to show you in your prescription , when your medication will be running out. Then arrange for the receptionist to get  you a return appointment, at least 7 days before you run out of medicine. Do not wait until you have 1 or 2 pills left, to come in. This is very poor planning and does not take into consideration that we may need to cancel appointments due to bad weather, sickness, or emergencies affecting our staff. DO NOT ACCEPT A "Partial Fill": If for any reason your pharmacy does not have enough pills/tablets to completely fill or refill your prescription, do not allow for a "partial fill". The law allows the pharmacy to complete that prescription within 72 hours, without requiring a new prescription. If they do not fill the rest of your prescription within those 72 hours, you will need a separate prescription to fill the remaining amount, which we will NOT provide. If the reason for the partial fill is your insurance, you will need to talk to the pharmacist about payment alternatives for the remaining tablets, but again, DO NOT ACCEPT A PARTIAL FILL, unless you can trust your pharmacist to obtain the remainder of the pills within 72 hours.  Prescription refills and/or changes in medication(s):  Prescription refills, and/or changes in dose or medication, will be conducted only during   scheduled medication management appointments. (Applies to both, written and electronic prescriptions.) No refills on procedure days. No medication will be changed or started on procedure days. No changes, adjustments, and/or refills will be conducted on a procedure day. Doing so will interfere with the diagnostic portion of the procedure. No phone refills. No medications will be "called into the pharmacy". No Fax refills. No weekend refills. No Holliday refills. No after hours refills.  Remember:  Business hours are:  Monday to Thursday 8:00 AM to 4:00 PM Provider's Schedule: Rydan Gulyas, MD - Appointments are:  Medication management: Monday and Wednesday 8:00 AM to 4:00 PM Procedure day: Tuesday and Thursday 7:30 AM to 4:00  PM Bilal Lateef, MD - Appointments are:  Medication management: Tuesday and Thursday 8:00 AM to 4:00 PM Procedure day: Monday and Wednesday 7:30 AM to 4:00 PM (Last update: 01/06/2022) ______________________________________________________________________   ____________________________________________________________________________________________  Naloxone Nasal Spray  Why am I receiving this medication? [place] STOP ACT requires that all patients taking high dose opioids or at risk of opioids respiratory depression, be prescribed an opioid reversal agent, such as Naloxone (AKA: Narcan).  What is this medication? NALOXONE (nal OX one) treats opioid overdose, which causes slow or shallow breathing, severe drowsiness, or trouble staying awake. Call emergency services after using this medication. You may need additional treatment. Naloxone works by reversing the effects of opioids. It belongs to a group of medications called opioid blockers.  COMMON BRAND NAME(S): Kloxxado, Narcan  What should I tell my care team before I take this medication? They need to know if you have any of these conditions: Heart disease Substance use disorder An unusual or allergic reaction to naloxone, other medications, foods, dyes, or preservatives Pregnant or trying to get pregnant Breast-feeding  When to use this medication? This medication is to be used for the treatment of respiratory depression (less than 8 breaths per minute) secondary to opioid overdose.   How to use this medication? This medication is for use in the nose. Lay the person on their back. Support their neck with your hand and allow the head to tilt back before giving the medication. The nasal spray should be given into 1 nostril. After giving the medication, move the person onto their side. Do not remove or test the nasal spray until ready to use. Get emergency medical help right away after giving the first dose of this  medication, even if the person wakes up. You should be familiar with how to recognize the signs and symptoms of a narcotic overdose. If more doses are needed, give the additional dose in the other nostril. Talk to your care team about the use of this medication in children. While this medication may be prescribed for children as young as newborns for selected conditions, precautions do apply.  Naloxone Overdosage: If you think you have taken too much of this medicine contact a poison control center or emergency room at once.  NOTE: This medicine is only for you. Do not share this medicine with others.  What if I miss a dose? This does not apply.  What may interact with this medication? This is only used during an emergency. No interactions are expected during emergency use. This list may not describe all possible interactions. Give your health care provider a list of all the medicines, herbs, non-prescription drugs, or dietary supplements you use. Also tell them if you smoke, drink alcohol, or use illegal drugs. Some items may interact with your medicine.  What should   I watch for while using this medication? Keep this medication ready for use in the case of an opioid overdose. Make sure that you have the phone number of your care team and local hospital ready. You may need to have additional doses of this medication. Each nasal spray contains a single dose. Some emergencies may require additional doses. After use, bring the treated person to the nearest hospital or call 911. Make sure the treating care team knows that the person has received a dose of this medication. You will receive additional instructions on what to do during and after use of this medication before an emergency occurs.  What side effects may I notice from receiving this medication? Side effects that you should report to your care team as soon as possible: Allergic reactions--skin rash, itching, hives, swelling of the face,  lips, tongue, or throat Side effects that usually do not require medical attention (report these to your care team if they continue or are bothersome): Constipation Dryness or irritation inside the nose Headache Increase in blood pressure Muscle spasms Stuffy nose Toothache This list may not describe all possible side effects. Call your doctor for medical advice about side effects. You may report side effects to FDA at 1-800-FDA-1088.  Where should I keep my medication? Because this is an emergency medication, you should keep it with you at all times.  Keep out of the reach of children and pets. Store between 20 and 25 degrees C (68 and 77 degrees F). Do not [place]. Throw away any unused medication after the expiration date. Keep in original box until ready to use.  NOTE: This sheet is a summary. It may not cover all possible information. If you have questions about this medicine, talk to your doctor, pharmacist, or health care provider.   2023 Elsevier/Gold Standard (2020-11-08 00:00:00)  ____________________________________________________________________________________________   

## 2022-08-25 ENCOUNTER — Encounter: Payer: Self-pay | Admitting: Nurse Practitioner

## 2022-08-25 ENCOUNTER — Ambulatory Visit (INDEPENDENT_AMBULATORY_CARE_PROVIDER_SITE_OTHER): Payer: 59 | Admitting: Nurse Practitioner

## 2022-08-25 VITALS — BP 126/79 | HR 93 | Temp 98.0°F | Wt 240.8 lb

## 2022-08-25 DIAGNOSIS — I2510 Atherosclerotic heart disease of native coronary artery without angina pectoris: Secondary | ICD-10-CM

## 2022-08-25 DIAGNOSIS — E1169 Type 2 diabetes mellitus with other specified complication: Secondary | ICD-10-CM

## 2022-08-25 DIAGNOSIS — E1159 Type 2 diabetes mellitus with other circulatory complications: Secondary | ICD-10-CM | POA: Diagnosis not present

## 2022-08-25 DIAGNOSIS — E1140 Type 2 diabetes mellitus with diabetic neuropathy, unspecified: Secondary | ICD-10-CM | POA: Diagnosis not present

## 2022-08-25 DIAGNOSIS — I152 Hypertension secondary to endocrine disorders: Secondary | ICD-10-CM

## 2022-08-25 DIAGNOSIS — J449 Chronic obstructive pulmonary disease, unspecified: Secondary | ICD-10-CM | POA: Diagnosis not present

## 2022-08-25 DIAGNOSIS — D509 Iron deficiency anemia, unspecified: Secondary | ICD-10-CM

## 2022-08-25 DIAGNOSIS — E785 Hyperlipidemia, unspecified: Secondary | ICD-10-CM

## 2022-08-25 DIAGNOSIS — E1142 Type 2 diabetes mellitus with diabetic polyneuropathy: Secondary | ICD-10-CM

## 2022-08-25 DIAGNOSIS — Z7984 Long term (current) use of oral hypoglycemic drugs: Secondary | ICD-10-CM

## 2022-08-25 NOTE — Assessment & Plan Note (Signed)
Chronic.  Controlled.  Using Trelegy maintenance inhalers daily. Using albuterol about 4 x daily.  Still wheezing on exam.  Continue with inhalers at home.  Continue to follow up with Pulmonology.  Follow up in 6 months.  Call sooner if concerns arise.

## 2022-08-25 NOTE — Assessment & Plan Note (Signed)
Labs ordered at visit today.  Will make recommendations based on lab results.   

## 2022-08-25 NOTE — Assessment & Plan Note (Signed)
Chronic. Not well controlled.  Last A1c was 8.1%.  See's Endocrinology.  Currently on Ozempic 2mg , Metformin and long acting insulin.  Patient was recently switched from Trulicity to Ozempic.   Recently got continuous glucose monitor.  Sugars are running 80- <200.  Continue to follow their recommendations. Follow up in 6 months. Call sooner if concerns arise.

## 2022-08-25 NOTE — Progress Notes (Signed)
BP 126/79   Pulse 93   Temp 98 F (36.7 C) (Oral)   Wt 240 lb 12.8 oz (109.2 kg)   SpO2 98%   BMI 30.92 kg/m    Subjective:    Patient ID: Darren Allen, male    DOB: [birth year], 57 y.o.   MRN: [id number]  HPI: Darren Allen is a 57 y.o. male  Chief Complaint  Patient presents with   Diabetes   HYPERTENSION / HYPERLIPIDEMIA Stopped taking the Lisinopril due to low blood pressures. Satisfied with current treatment? no Duration of hypertension: years BP monitoring frequency: daily BP range: 120/70 BP medication side effects: no Past BP meds: Metoprolol and HCTZ Duration of hyperlipidemia: years Cholesterol medication side effects: no Cholesterol supplements: none Past cholesterol medications: rosuvastatin (crestor) Medication compliance: excellent compliance Aspirin: no Recent stressors: no Recurrent headaches: no Visual changes: no Palpitationyes Chest pain: no Lower extremity edema: no Dizzy/lightheaded: no  DIABETES Saw Dr. Tedd Sias in may.  He was switched to Ozempic 2mg .  Hypoglycemic episodes:no Polydipsia/polyuria: no Visual disturbance: no Chest pain: no Paresthesias: no Glucose Monitoring: yes  Accucheck frequency: daily  Fasting glucose: 100-125  Post prandial:  Evening:  Before meals: Taking Insulin?: yes  Long acting insulin: 60 in am and 42 in the pm  Short acting insulin:  Blood Pressure Monitoring: daily Retinal Examination: Up to date- [facility] Exam: Up to Date Diabetic Education: Not Completed Pneumovax: Up to Date Influenza: Not up to Date Aspirin: no  COPD He is currently on Trelegy.   COPD status: controlled Satisfied with current treatment?: no Oxygen use: no Dyspnea frequency: yes Cough frequency: yes Rescue inhaler frequency:  4x daily Limitation of activity: yes Productive cough: yes Last Spirometry:  Pneumovax: Up to Date Influenza: Not up to Date See's Pulmonology   Relevant past medical, surgical,  family and social history reviewed and updated as indicated. Interim medical history since our last visit reviewed. Allergies and medications reviewed and updated.  Review of Systems  Eyes:  Negative for visual disturbance.  Respiratory:  Positive for shortness of breath. Negative for chest tightness.   Cardiovascular:  Negative for chest pain, palpitations and leg swelling.  Endocrine: Negative for polydipsia and polyuria.  Neurological:  Negative for dizziness, light-headedness, numbness and headaches.       Neuropathy    Per HPI unless specifically indicated above     Objective:    BP 126/79   Pulse 93   Temp 98 F (36.7 C) (Oral)   Wt 240 lb 12.8 oz (109.2 kg)   SpO2 98%   BMI 30.92 kg/m   Wt Readings from Last 3 Encounters:  08/25/22 240 lb 12.8 oz (109.2 kg)  08/24/22 240 lb (108.9 kg)  07/04/22 230 lb (104.3 kg)    Physical Exam Vitals and nursing note reviewed.  Constitutional:      General: He is not in acute distress.    Appearance: Normal appearance. He is not ill-appearing, toxic-appearing or diaphoretic.  HENT:     Head: Normocephalic.     Right Ear: External ear normal.     Left Ear: External ear normal.     Nose: Nose normal. No congestion or rhinorrhea.     Mouth/Throat:     Mouth: Mucous membranes are moist.  Eyes:     General:        Right eye: No discharge.        Left eye: No discharge.     Extraocular Movements: Extraocular movements  intact.     Conjunctiva/sclera: Conjunctivae normal.     Pupils: Pupils are equal, round, and reactive to light.  Cardiovascular:     Rate and Rhythm: Normal rate and regular rhythm.     Heart sounds: No murmur heard. Pulmonary:     Effort: Pulmonary effort is normal. No respiratory distress.     Breath sounds: Wheezing present. No rhonchi or rales.  Abdominal:     General: Abdomen is flat. Bowel sounds are normal.  Musculoskeletal:     Cervical back: Normal range of motion and neck supple.  Skin:     General: Skin is warm and dry.     Capillary Refill: Capillary refill takes less than 2 seconds.  Neurological:     General: No focal deficit present.     Mental Status: He is alert and oriented to person, place, and time.  Psychiatric:        Mood and Affect: Mood normal.        Behavior: Behavior normal.        Thought Content: Thought content normal.        Judgment: Judgment normal.     Results for orders placed or performed in visit on 07/31/22  Microscopic Examination   Urine  Result Value Ref Range   WBC, UA 0-5 0 - 5 /hpf   RBC, Urine None seen 0 - 2 /hpf   Epithelial Cells (non renal) None seen 0 - 10 /hpf   Bacteria, UA None seen None seen/Few  Urinalysis, Complete  Result Value Ref Range   Specific Gravity, UA 1.020 1.005 - 1.030   pH, UA 8.5 (H) 5.0 - 7.5   Color, UA Yellow Yellow   Appearance Ur Clear Clear   Leukocytes,UA Negative Negative   Protein,UA Negative Negative/Trace   Glucose, UA Trace (A) Negative   Ketones, UA Negative Negative   RBC, UA Negative Negative   Bilirubin, UA Negative Negative   Urobilinogen, Ur 1.0 0.2 - 1.0 mg/dL   Nitrite, UA Negative Negative   Microscopic Examination See below:   Testosterone  Result Value Ref Range   Testosterone 855 264 - 916 ng/dL  Hemoglobin and Hematocrit, Blood  Result Value Ref Range   Hemoglobin 12.4 (L) 13.0 - 17.7 g/dL   Hematocrit 16.1 09.6 - 51.0 %  BLADDER SCAN AMB NON-IMAGING  Result Value Ref Range   Scan Result 0 ml       Assessment & Plan:   Problem List Items Addressed This Visit       Cardiovascular and Mediastinum   Hypertension associated with diabetes (HCC) - Primary    Chronic.  Controlled. Continue with Metoprolol and HCTZ. Continue to check blood pressures at home.  Labs ordered today. Will make recommendations based on lab results.  Follow up in 3 months.  Call sooner if concerns arise.        Relevant Orders   Comp Met (CMET)   Coronary artery disease involving native  coronary artery of native heart without angina pectoris    Chronic.  Controlled.  Continue with current medication regimen of Rosuvastatin.  Labs ordered today.  Return to clinic in 6 months for reevaluation.  Call sooner if concerns arise.          Respiratory   Chronic obstructive pulmonary disease (HCC) (Chronic)    Chronic.  Controlled.  Using Trelegy maintenance inhalers daily. Using albuterol about 4 x daily.  Still wheezing on exam.  Continue with inhalers at home.  Continue to follow up with Pulmonology.  Follow up in 6 months.  Call sooner if concerns arise.         Endocrine   Diabetic peripheral neuropathy (HCC) (Chronic)    Chronic.  Controlled.  Continue with current medication regimen.  Followed by pain management.  Doing well at this time.  Labs ordered today.  Return to clinic in 6 months for reevaluation.  Call sooner if concerns arise.        Hyperlipidemia associated with type 2 diabetes mellitus (HCC)    Chronic.  Controlled.  Continue with current medication regimen of Crestor 40mg .  Labs ordered today.  Return to clinic in 6 months for reevaluation.  Call sooner if concerns arise.        Relevant Orders   Lipid Profile   Type 2 diabetes mellitus with diabetic neuropathy, without long-term current use of insulin (HCC)    Chronic. Not well controlled.  Last A1c was 8.1%.  See's Endocrinology.  Currently on Ozempic 2mg , Metformin and long acting insulin.  Patient was recently switched from Trulicity to Ozempic.   Recently got continuous glucose monitor.  Sugars are running 80- <200.  Continue to follow their recommendations. Follow up in 6 months. Call sooner if concerns arise.       Relevant Orders   HgB A1c     Other   Iron deficiency anemia    Labs ordered at visit today.  Will make recommendations based on lab results.        Relevant Orders   CBC w/Diff     Follow up plan: Return in about 6 months (around 02/24/2023) for Physical and Fasting  labs.

## 2022-08-25 NOTE — Assessment & Plan Note (Signed)
Chronic.  Controlled.  Continue with current medication regimen of Rosuvastatin.  Labs ordered today.  Return to clinic in 6 months for reevaluation.  Call sooner if concerns arise.    

## 2022-08-25 NOTE — Assessment & Plan Note (Signed)
Chronic.  Controlled.  Continue with current medication regimen of Crestor 40mg.  Labs ordered today.  Return to clinic in 6 months for reevaluation.  Call sooner if concerns arise.   

## 2022-08-25 NOTE — Assessment & Plan Note (Signed)
Chronic.  Controlled. Continue with Metoprolol and HCTZ. Continue to check blood pressures at home.  Labs ordered today. Will make recommendations based on lab results.  Follow up in 3 months.  Call sooner if concerns arise.

## 2022-08-25 NOTE — Assessment & Plan Note (Signed)
Chronic.  Controlled.  Continue with current medication regimen.  Followed by pain management.  Doing well at this time.  Labs ordered today.  Return to clinic in 6 months for reevaluation.  Call sooner if concerns arise.

## 2022-08-27 ENCOUNTER — Encounter: Payer: Self-pay | Admitting: Pain Medicine

## 2022-08-27 ENCOUNTER — Ambulatory Visit
Admission: RE | Admit: 2022-08-27 | Discharge: 2022-08-27 | Disposition: A | Discharge status: Home / Self Care | Payer: 59 | Source: Ambulatory Visit | Attending: Pain Medicine | Admitting: Pain Medicine

## 2022-08-27 ENCOUNTER — Ambulatory Visit: Payer: 59 | Attending: Pain Medicine | Admitting: Pain Medicine

## 2022-08-27 VITALS — BP 162/105 | HR 96 | Temp 99.0°F | Resp 17 | Ht 74.0 in | Wt 240.0 lb

## 2022-08-27 DIAGNOSIS — M545 Low back pain, unspecified: Secondary | ICD-10-CM | POA: Insufficient documentation

## 2022-08-27 DIAGNOSIS — Z5189 Encounter for other specified aftercare: Secondary | ICD-10-CM | POA: Insufficient documentation

## 2022-08-27 DIAGNOSIS — M4807 Spinal stenosis, lumbosacral region: Secondary | ICD-10-CM | POA: Diagnosis present

## 2022-08-27 DIAGNOSIS — M5137 Other intervertebral disc degeneration, lumbosacral region: Secondary | ICD-10-CM | POA: Diagnosis present

## 2022-08-27 DIAGNOSIS — M5442 Lumbago with sciatica, left side: Secondary | ICD-10-CM | POA: Diagnosis present

## 2022-08-27 DIAGNOSIS — R937 Abnormal findings on diagnostic imaging of other parts of musculoskeletal system: Secondary | ICD-10-CM | POA: Insufficient documentation

## 2022-08-27 DIAGNOSIS — G8929 Other chronic pain: Secondary | ICD-10-CM | POA: Insufficient documentation

## 2022-08-27 DIAGNOSIS — M5417 Radiculopathy, lumbosacral region: Secondary | ICD-10-CM | POA: Diagnosis present

## 2022-08-27 LAB — TOXASSURE SELECT 13 (MW), URINE

## 2022-08-27 MED ORDER — LIDOCAINE HCL 2 % IJ SOLN
20.0000 mL | Freq: Once | INTRAMUSCULAR | Status: AC
  Administered 2022-08-27: 100 mg
  Filled 2022-08-27: qty 40

## 2022-08-27 MED ORDER — SODIUM CHLORIDE 0.9% FLUSH
2.0000 mL | Freq: Once | INTRAVENOUS | Status: AC
  Administered 2022-08-27: 2 mL

## 2022-08-27 MED ORDER — ROPIVACAINE HCL 2 MG/ML IJ SOLN
2.0000 mL | Freq: Once | INTRAMUSCULAR | Status: AC
  Administered 2022-08-27: 2 mL via EPIDURAL
  Filled 2022-08-27: qty 20

## 2022-08-27 MED ORDER — LACTATED RINGERS IV SOLN: Freq: Once | INTRAVENOUS | Status: DC

## 2022-08-27 MED ORDER — PENTAFLUOROPROP-TETRAFLUOROETH EX AERO
INHALATION_SPRAY | Freq: Once | CUTANEOUS | Status: AC
  Administered 2022-08-27: 30 via TOPICAL
  Filled 2022-08-27: qty 116

## 2022-08-27 MED ORDER — MIDAZOLAM HCL 2 MG/2ML IJ SOLN: 0.5000 mg | Freq: Once | INTRAMUSCULAR | Status: DC

## 2022-08-27 MED ORDER — TRIAMCINOLONE ACETONIDE 40 MG/ML IJ SUSP
40.0000 mg | Freq: Once | INTRAMUSCULAR | Status: AC
  Administered 2022-08-27: 40 mg
  Filled 2022-08-27: qty 1

## 2022-08-27 MED ORDER — IOHEXOL 180 MG/ML  SOLN
10.0000 mL | Freq: Once | INTRAMUSCULAR | Status: AC
  Administered 2022-08-27: 10 mL via EPIDURAL
  Filled 2022-08-27: qty 20

## 2022-08-27 NOTE — Patient Instructions (Addendum)

## 2022-08-27 NOTE — Progress Notes (Signed)
Safety precautions to be maintained throughout the outpatient stay will include: orient to surroundings, keep bed in low position, maintain call bell within reach at all times, provide assistance with transfer out of bed and ambulation.  

## 2022-08-27 NOTE — Progress Notes (Signed)
PROVIDER NOTE: Interpretation of information contained herein should be left to medically-trained personnel. Specific patient instructions are provided elsewhere under "Patient Instructions" section of medical record. This document was created in part using STT-dictation technology, any transcriptional errors that may result from this process are unintentional.  Patient: Darren Allen Type: Established DOB: Sep 28, 1965 MRN: [id number] PCP: Larae Grooms, NP  Service: Procedure DOS: 08/27/2022 Setting: Ambulatory Location: Ambulatory outpatient facility Delivery: Face-to-face Provider: Oswaldo Done, MD Specialty: Interventional Pain Management Specialty designation: 09 Location: Outpatient facility Ref. Prov.: Larae Grooms, NP       Interventional Therapy   Procedure: Lumbar epidural steroid injection (LESI) (interlaminar) #3    Laterality: Left   Level:  L5-S1 Level.  Imaging: Fluoroscopic guidance         Anesthesia: Local anesthesia (1-2% Lidocaine) Anxiolysis: None                 Sedation: No Sedation                       DOS: 08/27/2022  Performed by: Oswaldo Done, MD  Purpose: Diagnostic/Therapeutic Indications: Lumbar radicular pain of intraspinal etiology of more than 4 weeks that has failed to respond to conservative therapy and is severe enough to impact quality of life or function. 1. Chronic low back pain (1ry area of Pain) (Bilateral) (R>L) w/o sciatica   2. Chronic low back pain (Bilateral) w/ sciatica (Left)   3. DDD (degenerative disc disease), lumbosacral   4. Lumbosacral lateral recess stenosis (Left: L5-S1)   5. Lumbosacral radiculopathy at S1 (Left)   6. Abnormal MRI, lumbar spine (03/11/2020)    NAS-11 Pain score:   Pre-procedure: 4 /10   Post-procedure: 4 /10      Position / Prep / Materials:  Position: Prone w/ head of the table raised (slight reverse trendelenburg) to facilitate breathing.  Prep solution: DuraPrep (Iodine  Povacrylex [0.7% available iodine] and Isopropyl Alcohol, 74% w/w) Prep Area: Entire Posterior Lumbar Region from lower scapular tip down to mid buttocks area and from flank to flank. Materials:  Tray: Epidural tray Needle(s):  Type: Epidural needle (Tuohy) Gauge (G):  17 Length: Regular (3.5-in) Qty: 1   Pre-op H&P Assessment:  Darren Allen is a 57 y.o. (year old), male patient, seen today for interventional treatment. He  has a past surgical history that includes Appendectomy; Incision and drainage perirectal abscess (N/A, 02/04/2015); Rectal exam under anesthesia (02/04/2015); ORIF ankle fracture (Left, 03/23/2017); Syndesmosis repair (Left, 03/23/2017); Colonoscopy with propofol (N/A, 12/31/2021); Esophagogastroduodenoscopy (N/A, 12/31/2021); Kidney stone surgery (Right); lung mass removal (N/A); Xi robotic assisted paraesophageal hernia repair (N/A, 01/20/2022); Insertion of mesh (01/20/2022); and Umbilical hernia repair (01/20/2022). Darren Allen has a current medication list which includes the following prescription(s): acetaminophen, amitriptyline, ascorbic acid, epinephrine, ferrous sulfate, fluticasone, gabapentin, hydrochlorothiazide, hydrocodone-acetaminophen, [START ON 09/24/2022] hydrocodone-acetaminophen, [START ON 10/24/2022] hydrocodone-acetaminophen, humulin 70/30 kwikpen, ipratropium-albuterol, magnesium oxide -mg supplement, metformin, montelukast, naloxone, ondansetron, proair hfa, rosuvastatin, semaglutide (2 mg/dose), tadalafil, tamsulosin, testosterone cypionate, trelegy ellipta, triamcinolone cream, and hydrocodone-acetaminophen. His primarily concern today is the Back Pain (Lumbar more on the left )  Initial Vital Signs:  Pulse/HCG Rate: 96ECG Heart Rate: 92 Temp: 99 F (37.2 C) Resp: 16 BP: (!) 145/84 SpO2: 97 %  BMI: Estimated body mass index is 30.81 kg/m as calculated from the following:   Height as of this encounter: 6\' 2"  (1.88 m).   Weight as of this encounter: 240 lb  (108.9 kg).  Risk  Assessment: Allergies: Reviewed. He is allergic to glipizide, cephalexin, and duloxetine.  Allergy Precautions: None required Coagulopathies: Reviewed. None identified.  Blood-thinner therapy: None at this time Active Infection(s): Reviewed. None identified. Darren Allen is afebrile  Site Confirmation: Darren Allen was asked to confirm the procedure and laterality before marking the site Procedure checklist: Completed Consent: Before the procedure and under the influence of no sedative(s), amnesic(s), or anxiolytics, the patient was informed of the treatment options, risks and possible complications. To fulfill our ethical and legal obligations, as recommended by the American Medical Association's Code of Ethics, I have informed the patient of my clinical impression; the nature and purpose of the treatment or procedure; the risks, benefits, and possible complications of the intervention; the alternatives, including doing nothing; the risk(s) and benefit(s) of the alternative treatment(s) or procedure(s); and the risk(s) and benefit(s) of doing nothing. The patient was provided information about the general risks and possible complications associated with the procedure. These may include, but are not limited to: failure to achieve desired goals, infection, bleeding, organ or nerve damage, allergic reactions, paralysis, and death. In addition, the patient was informed of those risks and complications associated to Spine-related procedures, such as failure to decrease pain; infection (i.e.: Meningitis, epidural or intraspinal abscess); bleeding (i.e.: epidural hematoma, subarachnoid hemorrhage, or any other type of intraspinal or peri-dural bleeding); organ or nerve damage (i.e.: Any type of peripheral nerve, nerve root, or spinal cord injury) with subsequent damage to sensory, motor, and/or autonomic systems, resulting in permanent pain, numbness, and/or weakness of one or several areas of  the body; allergic reactions; (i.e.: anaphylactic reaction); and/or death. Furthermore, the patient was informed of those risks and complications associated with the medications. These include, but are not limited to: allergic reactions (i.e.: anaphylactic or anaphylactoid reaction(s)); adrenal axis suppression; blood sugar elevation that in diabetics may result in ketoacidosis or comma; water retention that in patients with history of congestive heart failure may result in shortness of breath, pulmonary edema, and decompensation with resultant heart failure; weight gain; swelling or edema; medication-induced neural toxicity; particulate matter embolism and blood vessel occlusion with resultant organ, and/or nervous system infarction; and/or aseptic necrosis of one or more joints. Finally, the patient was informed that Medicine is not an exact science; therefore, there is also the possibility of unforeseen or unpredictable risks and/or possible complications that may result in a catastrophic outcome. The patient indicated having understood very clearly. We have given the patient no guarantees and we have made no promises. Enough time was given to the patient to ask questions, all of which were answered to the patient's satisfaction. Darren Allen has indicated that he wanted to continue with the procedure. Attestation: I, the ordering provider, attest that I have discussed with the patient the benefits, risks, side-effects, alternatives, likelihood of achieving goals, and potential problems during recovery for the procedure that I have provided informed consent. Date  Time: 08/27/2022 10:58 AM   Pre-Procedure Preparation:  Monitoring: As per clinic protocol. Respiration, ETCO2, SpO2, BP, heart rate and rhythm monitor placed and checked for adequate function Safety Precautions: Patient was assessed for positional comfort and pressure points before starting the procedure. Time-out: I initiated and conducted the  "Time-out" before starting the procedure, as per protocol. The patient was asked to participate by confirming the accuracy of the "Time Out" information. Verification of the correct person, site, and procedure were performed and confirmed by me, the nursing staff, and the patient. "Time-out" conducted as per Joint Commission's  Universal Protocol (UP.01.01.01). Time: 1137 Start Time: 1137 hrs.  Description/Narrative of Procedure:          Target: Epidural space via interlaminar opening, initially targeting the lower laminar border of the superior vertebral body. Region: Lumbar Approach: Percutaneous paravertebral  Rationale (medical necessity): procedure needed and proper for the diagnosis and/or treatment of the patient's medical symptoms and needs. Procedural Technique Safety Precautions: Aspiration looking for blood return was conducted prior to all injections. At no point did we inject any substances, as a needle was being advanced. No attempts were made at seeking any paresthesias. Safe injection practices and needle disposal techniques used. Medications properly checked for expiration dates. SDV (single dose vial) medications used. Description of the Procedure: Protocol guidelines were followed. The procedure needle was introduced through the skin, ipsilateral to the reported pain, and advanced to the target area. Bone was contacted and the needle walked caudad, until the lamina was cleared. The epidural space was identified using "loss-of-resistance technique" with 2-3 ml of PF-NaCl (0.9% NSS), in a 5cc LOR glass syringe.  Vitals:   08/27/22 1045 08/27/22 1137 08/27/22 1140  BP: (!) 145/84 (!) 145/108 (!) 162/105  Pulse: 96    Resp: 16 15 17   Temp: 99 F (37.2 C)    TempSrc: Temporal    SpO2: 97% 97% 97%  Weight: 240 lb (108.9 kg)    Height: 6\' 2"  (1.88 m)      Start Time: 1137 hrs. End Time: 1140 hrs.  Imaging Guidance (Spinal):          Type of Imaging Technique: Fluoroscopy  Guidance (Spinal) Indication(s): Assistance in needle guidance and placement for procedures requiring needle placement in or near specific anatomical locations not easily accessible without such assistance. Exposure Time: Please see nurses notes. Contrast: Before injecting any contrast, we confirmed that the patient did not have an allergy to iodine, shellfish, or radiological contrast. Once satisfactory needle placement was completed at the desired level, radiological contrast was injected. Contrast injected under live fluoroscopy. No contrast complications. See chart for type and volume of contrast used. Fluoroscopic Guidance: I was personally present during the use of fluoroscopy. "Tunnel Vision Technique" used to obtain the best possible view of the target area. Parallax error corrected before commencing the procedure. "Direction-depth-direction" technique used to introduce the needle under continuous pulsed fluoroscopy. Once target was reached, antero-posterior, oblique, and lateral fluoroscopic projection used confirm needle placement in all planes. Images permanently stored in EMR. Interpretation: I personally interpreted the imaging intraoperatively. Adequate needle placement confirmed in multiple planes. Appropriate spread of contrast into desired area was observed. No evidence of afferent or efferent intravascular uptake. No intrathecal or subarachnoid spread observed. Permanent images saved into the patient's record.  Antibiotic Prophylaxis:   Anti-infectives (From admission, onward)    None      Indication(s): None identified  Post-operative Assessment:  Post-procedure Vital Signs:  Pulse/HCG Rate: 9698 Temp: 99 F (37.2 C) Resp: 17 BP: (!) 162/105 SpO2: 97 %  EBL: None  Complications: No immediate post-treatment complications observed by team, or reported by patient.  Note: The patient tolerated the entire procedure well. A repeat set of vitals were taken after the  procedure and the patient was kept under observation following institutional policy, for this type of procedure. Post-procedural neurological assessment was performed, showing return to baseline, prior to discharge. The patient was provided with post-procedure discharge instructions, including a section on how to identify potential problems. Should any problems arise concerning this procedure, the  patient was given instructions to immediately contact us, at any time, without hesitation. In any case, we plan to contact the patient by telephone for a follow-up status report regarding this interventional procedure.  Comments:  No additional relevant information.  Plan of Care (POC)  Orders:  Orders Placed This Encounter  Procedures   Lumbar Epidural Injection    Scheduling Instructions:     Procedure: Interlaminar LESI L5-S1     Laterality: Left     Sedation: Patient's choice     Timeframe: Today    Order Specific Question:   Where will this procedure be performed?    Answer:   [facility]   DG PAIN [facility] C-ARM 1-60 MIN NO REPORT    Intraoperative interpretation by procedural physician at [facility].    Standing Status:   Standing    Number of Occurrences:   1    Order Specific Question:   Reason for exam:    Answer:   Assistance in needle guidance and placement for procedures requiring needle placement in or near specific anatomical locations not easily accessible without such assistance.   Informed Consent Details: Physician/Practitioner Attestation; Transcribe to consent form and obtain patient signature    Note: Always confirm laterality of pain with Darren Allen, before procedure. Transcribe to consent form and obtain patient signature.    Order Specific Question:   Physician/Practitioner attestation of informed consent for procedure/surgical case    Answer:   I, the physician/practitioner, attest that I have discussed with the patient the benefits, risks, side  effects, alternatives, likelihood of achieving goals and potential problems during recovery for the procedure that I have provided informed consent.    Order Specific Question:   Procedure    Answer:   Lumbar epidural steroid injection under fluoroscopic guidance    Order Specific Question:   Physician/Practitioner performing the procedure    Answer:   Asahd Can A. Laban Emperor, MD    Order Specific Question:   Indication/Reason    Answer:   Low back and/or lower extremity pain secondary to lumbar radiculitis   Provide equipment / supplies at bedside    Procedural tray: Epidural Tray (Disposable  single use) Skin infiltration needle: Regular 1.5-in, 25-G, (x1) Block needle size: Regular standard Catheter: No catheter required    Standing Status:   Standing    Number of Occurrences:   1    Order Specific Question:   Specify    Answer:   Epidural Tray   Follow-up    Schedule Darren Allen for a post-procedure follow-up evaluation encounter 2 weeks from now.    Standing Status:   Future    Standing Expiration Date:   09/10/2022    Scheduling Instructions:     Schedule follow-up visit on afternoon of procedure day (T, Th)     Type: Face-to-face (F2F) Post-procedure (PP) evaluation (E/M)     When: 2 weeks from now   Chronic Opioid Analgesic:  Hydrocodone/APAP 5/325 tablet, 1 tab p.o. twice daily (#60) MME/day: 10 mg/day   Medications ordered for procedure: Meds ordered this encounter  Medications   iohexol (OMNIPAQUE) 180 MG/ML injection 10 mL    Must be Myelogram-compatible. If not available, you may substitute with a water-soluble, non-ionic, hypoallergenic, myelogram-compatible radiological contrast medium.   lidocaine (XYLOCAINE) 2 % (with pres) injection 400 mg   pentafluoroprop-tetrafluoroeth (GEBAUERS) aerosol   DISCONTD: lactated ringers infusion   DISCONTD: midazolam (VERSED) injection 0.5-2 mg    Make sure Flumazenil is available in  the pyxis when using this medication. If  oversedation occurs, administer 0.2 mg IV over 15 sec. If after 45 sec no response, administer 0.2 mg again over 1 min; may repeat at 1 min intervals; not to exceed 4 doses (1 mg)   sodium chloride flush (NS) 0.9 % injection 2 mL   ropivacaine (PF) 2 mg/mL (0.2%) (NAROPIN) injection 2 mL   triamcinolone acetonide (KENALOG-40) injection 40 mg   Medications administered: We administered iohexol, lidocaine, pentafluoroprop-tetrafluoroeth, sodium chloride flush, ropivacaine (PF) 2 mg/mL (0.2%), and triamcinolone acetonide.  See the medical record for exact dosing, route, and time of administration.  Follow-up plan:   Return in about 2 weeks (around 09/10/2022) for Proc-day (T,Th), (Face2F), (PPE).       Interventional Therapies  Risk Factors  Considerations:   WNL   Planned  Pending:   Therapeutic left L5-S1 LESI #3    Under consideration:   Therapeutic left RACZ procedure #1  Diagnostic bilateral lumbar facet MBB #1  Diagnostic/therapeutic left L5 & S1 TFESI #1  Possible spinal cord stimulator trial  Therapeutic left L5-S1 percutaneous discectomy with "[employer] Dekompressor" system    Completed:   Diagnostic midline to left caudal ESI x1 (04/02/2022) (100/100/100/LBP:100/LEP:100)  Diagnostic left L5-S1 LESI x2 (05/26/2022) (1st:100/100/100/LBP:85  LEP:100) (2nd: [birth year]/LBP:60LLEP:100) Therapeutic bilateral Qutenza neurolytic treatment x1 (12/23/2021)  (09/08/2021 & 10/29/2021) referral to physical therapy for evaluation and treatment of low back pain.   Completed by other providers:   EMG/PNCV of lower extremity (02/20/2020) by Dr. Cristopher Peru (generalized sensorimotor peripheral neuropathy; superimposed left S1 radiculopathy)   Therapeutic  Palliative (PRN) options:   None established        Recent Visits Date Type Provider Dept  08/24/22 Office Visit Delano Metz, MD [facility]  06/09/22 Office Visit Delano Metz, MD [facility]  Showing  recent visits within past 90 days and meeting all other requirements Today's Visits Date Type Provider Dept  08/27/22 Procedure visit Delano Metz, MD [facility]  Showing today's visits and meeting all other requirements Future Appointments Date Type Provider Dept  11/18/22 Appointment Delano Metz, MD [facility]  Showing future appointments within next 90 days and meeting all other requirements  Disposition: Discharge home  Discharge (Date  Time): 08/27/2022; 1150 hrs.   Primary Care Physician: Larae Grooms, NP Location: [facility] Note by: Oswaldo Done, MD (TTS technology used. I apologize for any typographical errors that were not detected and corrected.) Date: 08/27/2022; Time: 11:45 AM  Disclaimer:  Medicine is not an Visual merchandiser. The only guarantee in medicine is that nothing is guaranteed. It is important to note that the decision to proceed with this intervention was based on the information collected from the patient. The Data and conclusions were drawn from the patient's questionnaire, the interview, and the physical examination. Because the information was provided in large part by the patient, it cannot be guaranteed that it has not been purposely or unconsciously manipulated. Every effort has been made to obtain as much relevant data as possible for this evaluation. It is important to note that the conclusions that lead to this procedure are derived in large part from the available data. Always take into account that the treatment will also be dependent on availability of resources and existing treatment guidelines, considered by other Pain Management Practitioners as being common knowledge and practice, at the time of the intervention. For Medico-Legal purposes, it is also important to point out that variation  in procedural techniques and pharmacological choices are the acceptable norm. The indications,  contraindications, technique, and results of the above procedure should only be interpreted and judged by a Board-Certified Interventional Pain Specialist with extensive familiarity and expertise in the same exact procedure and technique.

## 2022-08-28 ENCOUNTER — Telehealth: Payer: Self-pay

## 2022-08-28 NOTE — Telephone Encounter (Signed)
Called PPNo answer. Message left to call if needed.

## 2022-08-31 ENCOUNTER — Other Ambulatory Visit: Payer: Self-pay | Admitting: Family Medicine

## 2022-08-31 ENCOUNTER — Other Ambulatory Visit: Payer: Self-pay | Admitting: *Deleted

## 2022-08-31 DIAGNOSIS — E349 Endocrine disorder, unspecified: Secondary | ICD-10-CM

## 2022-08-31 MED ORDER — "BD SAFETYGLIDE NEEDLE 21G X 1"" MISC": 1.0000 mg | 0 refills | Status: DC

## 2022-08-31 MED ORDER — TESTOSTERONE CYPIONATE 200 MG/ML IM SOLN
200.0000 mg | INTRAMUSCULAR | 0 refills | Status: DC
Start: 2022-08-31 — End: 2023-02-08

## 2022-08-31 MED ORDER — "BD SAFETYGLIDE NEEDLE 18G X 1-1/2"" MISC": 1.0000 mg | 0 refills | Status: DC

## 2022-09-14 ENCOUNTER — Encounter: Payer: Self-pay | Admitting: Nurse Practitioner

## 2022-09-16 NOTE — Telephone Encounter (Signed)
Attempted to reach patient Darren Allen to call office to get scheduled if he is wanting a prescription for acne cream.  Put in CRM.

## 2022-09-21 ENCOUNTER — Other Ambulatory Visit: Payer: Self-pay | Admitting: Nurse Practitioner

## 2022-09-21 MED ORDER — "BD SAFETYGLIDE NEEDLE 18G X 1-1/2"" MISC": 1.0000 mg | 0 refills | Status: DC

## 2022-09-21 MED ORDER — "BD SAFETYGLIDE NEEDLE 21G X 1"" MISC": 1.0000 mg | 0 refills | Status: DC

## 2022-09-21 NOTE — Telephone Encounter (Signed)
Requested Prescriptions  Pending Prescriptions Disp Refills   rosuvastatin (CRESTOR) 40 MG tablet [Pharmacy Med Name: ROSUVASTATIN 40MG  TABLETS] 90 tablet 0    Sig: TAKE 1 TABLET(40 MG) BY MOUTH DAILY     Cardiovascular:  Antilipid - Statins 2 Failed - 09/21/2022  1:55 AM      Failed - Lipid Panel in normal range within the last 12 months    Cholesterol, Total  Date Value Ref Range Status  05/22/2022 107 100 - 199 mg/dL Final   LDL Chol Calc (NIH)  Date Value Ref Range Status  05/22/2022 16 0 - 99 mg/dL Final   HDL  Date Value Ref Range Status  05/22/2022 25 (L) >39 mg/dL Final   Triglycerides  Date Value Ref Range Status  05/22/2022 496 (H) 0 - 149 mg/dL Final         Passed - Cr in normal range and within 360 days    Creatinine  Date Value Ref Range Status  03/13/2014 1.08 0.60 - 1.30 mg/dL Final   Creatinine, Ser  Date Value Ref Range Status  05/22/2022 1.22 0.76 - 1.27 mg/dL Final         Passed - Patient is not pregnant      Passed - Valid encounter within last 12 months    Recent Outpatient Visits           3 weeks ago Hypertension associated with diabetes [facility])   [place] [facility] Larae Grooms, NP   3 months ago Type 2 diabetes mellitus with diabetic neuropathy, without long-term current use of insulin (HCC)   [place] [facility] Stevenson, Megan P, DO   4 months ago Hypertension associated with diabetes [facility])   [place] [facility] Larae Grooms, NP   4 months ago Hypertension associated with diabetes [facility])   [place] [facility] Larae Grooms, NP   5 months ago Sleep disturbance   [place] [facility] Larae Grooms, NP       Future Appointments             In 3 days Mecum, Oswaldo Conroy, PA-C [facility]   In 5 months Larae Grooms, NP [facility]

## 2022-09-22 ENCOUNTER — Encounter: Payer: Self-pay | Admitting: Pain Medicine

## 2022-09-22 ENCOUNTER — Ambulatory Visit: Payer: 59 | Attending: Pain Medicine | Admitting: Pain Medicine

## 2022-09-22 VITALS — BP 133/73 | HR 105 | Temp 97.6°F | Resp 16 | Ht 74.0 in | Wt 237.8 lb

## 2022-09-22 DIAGNOSIS — Z09 Encounter for follow-up examination after completed treatment for conditions other than malignant neoplasm: Secondary | ICD-10-CM | POA: Insufficient documentation

## 2022-09-22 DIAGNOSIS — M5442 Lumbago with sciatica, left side: Secondary | ICD-10-CM | POA: Insufficient documentation

## 2022-09-22 DIAGNOSIS — G8929 Other chronic pain: Secondary | ICD-10-CM | POA: Insufficient documentation

## 2022-09-22 DIAGNOSIS — M5417 Radiculopathy, lumbosacral region: Secondary | ICD-10-CM | POA: Insufficient documentation

## 2022-09-22 DIAGNOSIS — M47816 Spondylosis without myelopathy or radiculopathy, lumbar region: Secondary | ICD-10-CM | POA: Insufficient documentation

## 2022-09-22 NOTE — Progress Notes (Signed)
PROVIDER NOTE: Information contained herein reflects review and annotations entered in association with encounter. Interpretation of such information and data should be left to medically-trained personnel. Information provided to patient can be located elsewhere in the medical record under "Patient Instructions". Document created using STT-dictation technology, any transcriptional errors that may result from process are unintentional.    Patient: Darren Allen  Service Category: E/M  Provider: Oswaldo Done, MD  DOB: 24-Sep-1965  DOS: 09/22/2022  Referring Provider: Larae Grooms, NP  MRN: [id number]  Specialty: Interventional Pain Management  PCP: Larae Grooms, NP  Type: Established Patient  Setting: Ambulatory outpatient    Location: Office  Delivery: Face-to-face     HPI  Mr. MIGUEL CHRISTIANA, a 57 y.o. year old male, is here today because of his Chronic bilateral low back pain with left-sided sciatica [M54.42, G89.29]. Mr. Settlemire primary complain today is Back Pain (lower)  Pertinent problems: Mr. Lachapelle has Ankle fracture; Neuropathy; Diabetic peripheral neuropathy (HCC); Gout; Chronic ankle pain (Bilateral); Chronic low back pain (1ry area of Pain) (Bilateral) (R>L) w/o sciatica; Other acquired hammer toe; Chronic pain syndrome; Lumbar facet syndrome; Lumbosacral radiculopathy at S1 (Left); Chronic feet pain (2ry area of Pain) (Bilateral); Chronic ankle pain (Left); Abnormal NCS (nerve conduction studies) (02/20/2020); Abnormal MRI, lumbar spine (03/11/2020); DDD (degenerative disc disease), lumbosacral; Lumbosacral lateral recess stenosis (Left: L5-S1); Chronic low back pain (Bilateral) w/ sciatica (Left); and Abnormal MRI, thoracic spine (07/05/2020) on their pertinent problem list. Pain Assessment: Severity of Chronic pain is reported as a 0-No pain/10. Location: Back Lower, Right, Left (No pain since LESI)/denies. Onset:  . Quality:  (No pain today). Timing:  . Modifying factor(s):  procedure, rest. Vitals:  height is 6\' 2"  (1.88 m) and weight is 237 lb 12.8 oz (107.9 kg). His temperature is 97.6 F (36.4 C). His blood pressure is 133/73 and his pulse is 105 (abnormal). His respiration is 16 and oxygen saturation is 100%.  BMI: Estimated body mass index is 30.53 kg/m as calculated from the following:   Height as of this encounter: 6\' 2"  (1.88 m).   Weight as of this encounter: 237 lb 12.8 oz (107.9 kg). Last encounter: 08/24/2022. Last procedure: 08/27/2022.  Reason for encounter: post-procedure evaluation and assessment. The patient indicates having attained 100% relief of the pain for the duration of the local anesthetic followed by an ongoing 90% improvement.  With this we have completed a series of 3 lumbar epidural steroid injections.  Post-procedure evaluation   Procedure: Lumbar epidural steroid injection (LESI) (interlaminar) #3    Laterality: Left   Level:  L5-S1 Level.  Imaging: Fluoroscopic guidance         Anesthesia: Local anesthesia (1-2% Lidocaine) Anxiolysis: None                 Sedation: No Sedation                       DOS: 08/27/2022  Performed by: Oswaldo Done, MD  Purpose: Diagnostic/Therapeutic Indications: Lumbar radicular pain of intraspinal etiology of more than 4 weeks that has failed to respond to conservative therapy and is severe enough to impact quality of life or function. 1. Chronic low back pain (1ry area of Pain) (Bilateral) (R>L) w/o sciatica   2. Chronic low back pain (Bilateral) w/ sciatica (Left)   3. DDD (degenerative disc disease), lumbosacral   4. Lumbosacral lateral recess stenosis (Left: L5-S1)   5. Lumbosacral radiculopathy at S1 (Left)  6. Abnormal MRI, lumbar spine (03/11/2020)    NAS-11 Pain score:   Pre-procedure: 4 /10   Post-procedure: 4 /10      Effectiveness:  Initial hour after procedure: 100 %. Subsequent 4-6 hours post-procedure: 100 %. Analgesia past initial 6 hours: 90 % (Current). Ongoing  improvement:  Analgesic: The patient indicates having attained 100% relief of the pain for the duration of the local anesthetic followed by an ongoing 90% improvement.  With this we have completed a series of 3 lumbar epidural steroid injections. Function: Mr. Crescenzo reports improvement in function ROM: Mr. Sayegh reports improvement in ROM  Pharmacotherapy Assessment  Analgesic: Hydrocodone/APAP 5/325 tablet, 1 tab p.o. twice daily (#60) MME/day: 10 mg/day   Monitoring: [place] PMP: PDMP reviewed during this encounter.       Pharmacotherapy: No side-effects or adverse reactions reported. Compliance: No problems identified. Effectiveness: Clinically acceptable.  No notes on file  No results found for: "CBDTHCR" No results found for: "D8THCCBX" No results found for: "D9THCCBX"  UDS:  Summary  Date Value Ref Range Status  08/24/2022 Note  Final    Comment:    ==================================================================== [employer] Select 13 (MW) ==================================================================== Test                             Result       Flag       Units  Drug Present and Declared for Prescription Verification   Hydrocodone                    312          EXPECTED   ng/mg creat   Norhydrocodone                 398          EXPECTED   ng/mg creat    Sources of hydrocodone include scheduled prescription medications.    Norhydrocodone is an expected metabolite of hydrocodone.  ==================================================================== Test                      Result    Flag   Units      Ref Range   Creatinine              161              mg/dL      >=16 ==================================================================== Declared Medications:  The flagging and interpretation on this report are based on the  following declared medications.  Unexpected results may arise from  inaccuracies in the declared medications.   **Note: The testing scope  of this panel includes these medications:   Hydrocodone (Norco)   **Note: The testing scope of this panel does not include the  following reported medications:   Acetaminophen (Tylenol)  Acetaminophen (Norco)  Albuterol (Proair HFA)  Albuterol (Duoneb)  Amitriptyline (Elavil)  Epinephrine (EpiPen)  Fluticasone (Trelegy)  Fluticasone (Flonase)  Gabapentin (Neurontin)  Hydrochlorothiazide (Hydrodiuril)  Insulin (Humulin)  Ipratropium (Duoneb)  Iron  Lisinopril (Zestril)  Magnesium (Mag-Ox)  Meloxicam (Mobic)  Metformin (Glucophage)  Montelukast (Singulair)  Naloxone (Narcan)  Ondansetron (Zofran)  Prednisone (Deltasone)  Rosuvastatin (Crestor)  Semaglutide  Sulfamethoxazole (Bactrim)  Tadalafil (Cialis)  Tamsulosin (Flomax)  Testosterone  Triamcinolone (Kenalog)  Trimethoprim (Bactrim)  Umeclidinium (Trelegy)  Vilanterol (Trelegy)  Vitamin C ==================================================================== For clinical consultation, please call [phone]. ====================================================================       ROS  Constitutional: Denies any  fever or chills Gastrointestinal: No reported hemesis, hematochezia, vomiting, or acute GI distress Musculoskeletal: Denies any acute onset joint swelling, redness, loss of ROM, or weakness Neurological: No reported episodes of acute onset apraxia, aphasia, dysarthria, agnosia, amnesia, paralysis, loss of coordination, or loss of consciousness  Medication Review  EPINEPHrine, Fluticasone-Umeclidin-Vilant, HYDROcodone-acetaminophen, Magnesium Oxide -Mg Supplement, NEEDLE (DISP) 18 G, NEEDLE (DISP) 21 G, Semaglutide (2 MG/DOSE), acetaminophen, albuterol, amitriptyline, ascorbic acid, ferrous sulfate, fluticasone, gabapentin, hydrochlorothiazide, insulin isophane & regular human KwikPen, ipratropium-albuterol, metFORMIN, montelukast, naloxone, ondansetron, rosuvastatin, tadalafil, tamsulosin, testosterone  cypionate, and triamcinolone cream  History Review  Allergy: Mr. Christopherson is allergic to glipizide, cephalexin, and duloxetine. Drug: Mr. Tamura  reports no history of drug use. Alcohol:  reports that he does not currently use alcohol after a past usage of about 12.0 standard drinks of alcohol per week. Tobacco:  reports that he has been smoking cigarettes. He has a 17.00 pack-year smoking history. He has been exposed to tobacco smoke. He has never used smokeless tobacco. Social: Mr. Beavers  reports that he has been smoking cigarettes. He has a 17.00 pack-year smoking history. He has been exposed to tobacco smoke. He has never used smokeless tobacco. He reports that he does not currently use alcohol after a past usage of about 12.0 standard drinks of alcohol per week. He reports that he does not use drugs. Medical:  has a past medical history of Allergy, Calculus of kidney (02/04/2015), COPD (chronic obstructive pulmonary disease) (HCC), Diabetes mellitus without complication (HCC), Hypertension, and Neuropathy. Surgical: Mr. Bernstein  has a past surgical history that includes Appendectomy; Incision and drainage perirectal abscess (N/A, 02/04/2015); Rectal exam under anesthesia (02/04/2015); ORIF ankle fracture (Left, 03/23/2017); Syndesmosis repair (Left, 03/23/2017); Colonoscopy with propofol (N/A, 12/31/2021); Esophagogastroduodenoscopy (N/A, 12/31/2021); Kidney stone surgery (Right); lung mass removal (N/A); Xi robotic assisted paraesophageal hernia repair (N/A, 01/20/2022); Insertion of mesh (01/20/2022); and Umbilical hernia repair (01/20/2022). Family: family history includes Alcohol abuse in his father; Cancer in his father; Cancer (age of onset: 70) in his brother; Cancer (age of onset: 36) in his mother; Diabetes in his brother; Heart disease in his brother and father.  Laboratory Chemistry Profile   Renal Lab Results  Component Value Date   BUN 16 05/22/2022   CREATININE 1.22 05/22/2022   BCR  13 05/22/2022   GFRAA 59 (L) 12/07/2019   GFRNONAA >60 02/15/2022    Hepatic Lab Results  Component Value Date   AST 17 05/22/2022   ALT 21 05/22/2022   ALBUMIN 4.5 05/22/2022   ALKPHOS 84 05/22/2022   AMYLASE 93 02/04/2022   LIPASE 115 (H) 02/04/2022    Electrolytes Lab Results  Component Value Date   NA 137 05/22/2022   K 4.3 05/22/2022   CL 99 05/22/2022   CALCIUM 9.9 05/22/2022   MG 1.8 01/25/2022   PHOS 2.3 (L) 01/23/2022    Bone Lab Results  Component Value Date   25OHVITD1 44 09/08/2021   25OHVITD2 <1.0 09/08/2021   25OHVITD3 43 09/08/2021   TESTOSTERONE 855 07/31/2022    Inflammation (CRP: Acute Phase) (ESR: Chronic Phase) Lab Results  Component Value Date   CRP 1.8 (H) 09/08/2021   ESRSEDRATE 17 09/08/2021   LATICACIDVEN 1.6 02/03/2022         Note: Above Lab results reviewed.  Recent Imaging Review  DG PAIN CLINIC C-ARM 1-60 MIN NO REPORT Fluoro was used, but no Radiologist interpretation will be provided.  Please refer to "NOTES" tab for provider progress note. Note: Reviewed  Physical Exam  General appearance: Well nourished, well developed, and well hydrated. In no apparent acute distress Mental status: Alert, oriented x 3 (person, place, & time)       Respiratory: No evidence of acute respiratory distress Eyes: PERLA Vitals: BP 133/73   Pulse (!) 105   Temp 97.6 F (36.4 C)   Resp 16   Ht 6\' 2"  (1.88 m)   Wt 237 lb 12.8 oz (107.9 kg)   SpO2 100%   BMI 30.53 kg/m  BMI: Estimated body mass index is 30.53 kg/m as calculated from the following:   Height as of this encounter: 6\' 2"  (1.88 m).   Weight as of this encounter: 237 lb 12.8 oz (107.9 kg). Ideal: Ideal body weight: 82.2 kg (181 lb 3.5 oz) Adjusted ideal body weight: 92.5 kg (203 lb 13.6 oz)  Assessment   Diagnosis Status  1. Chronic low back pain (Bilateral) w/ sciatica (Left)   2. Lumbosacral radiculopathy at S1 (Left)   3. Lumbar facet syndrome   4. Postop check     Controlled Controlled Controlled   Updated Problems: No problems updated.  Plan of Care  Problem-specific:  No problem-specific Assessment & Plan notes found for this encounter.  Mr. AMAIR SHROUT has a current medication list which includes the following long-term medication(s): ferrous sulfate, fluticasone, hydrochlorothiazide, hydrocodone-acetaminophen, [START ON 09/24/2022] hydrocodone-acetaminophen, [START ON 10/24/2022] hydrocodone-acetaminophen, humulin 70/30 kwikpen, metformin, montelukast, rosuvastatin, tadalafil, testosterone cypionate, and hydrocodone-acetaminophen.  Pharmacotherapy (Medications Ordered): No orders of the defined types were placed in this encounter.  Orders:  Orders Placed This Encounter  Procedures   Nursing Instructions:    Please complete this patient's postprocedure evaluation.    Scheduling Instructions:     Please complete this patient's postprocedure evaluation.   Follow-up plan:   Return for scheduled encounter.      Interventional Therapies  Risk Factors  Considerations:   WNL   Planned  Pending:   Therapeutic left L5-S1 LESI #3    Under consideration:   Therapeutic left RACZ procedure #1  Diagnostic bilateral lumbar facet MBB #1  Diagnostic/therapeutic left L5 & S1 TFESI #1  Possible spinal cord stimulator trial  Therapeutic left L5-S1 percutaneous discectomy with "[employer] Dekompressor" system    Completed:   Diagnostic midline to left caudal ESI x1 (04/02/2022) (100/100/100/LBP:100/LEP:100)  Diagnostic left L5-S1 LESI x2 (05/26/2022) (1st:100/100/100/LBP:85  LEP:100) (2nd: 01[birth year]13/59/LBP:60LLEP:100) Therapeutic bilateral Qutenza neurolytic treatment x1 (12/23/2021)  (09/08/2021 & 10/29/2021) referral to physical therapy for evaluation and treatment of low back pain.   Completed by other providers:   EMG/PNCV of lower extremity (02/20/2020) by Dr. Cristopher Peru (generalized sensorimotor peripheral neuropathy; superimposed left S1  radiculopathy)   Therapeutic  Palliative (PRN) options:   None established         Recent Visits Date Type Provider Dept  08/27/22 Procedure visit Delano Metz, MD [facility]  08/24/22 Office Visit Delano Metz, MD [facility]  Showing recent visits within past 90 days and meeting all other requirements Today's Visits Date Type Provider Dept  09/22/22 Office Visit Delano Metz, MD [facility]  Showing today's visits and meeting all other requirements Future Appointments Date Type Provider Dept  11/18/22 Appointment Delano Metz, MD [facility]  Showing future appointments within next 90 days and meeting all other requirements  I discussed the assessment and treatment plan with the patient. The patient was provided an opportunity to ask questions and all were answered. The patient agreed with the plan and  demonstrated an understanding of the instructions.  Patient advised to call back or seek an in-person evaluation if the symptoms or condition worsens.  Duration of encounter: 30 minutes.  Total time on encounter, as per AMA guidelines included both the face-to-face and non-face-to-face time personally spent by the physician and/or other qualified health care professional(s) on the day of the encounter (includes time in activities that require the physician or other qualified health care professional and does not include time in activities normally performed by clinical staff). Physician's time may include the following activities when performed: Preparing to see the patient (e.g., pre-charting review of records, searching for previously ordered imaging, lab work, and nerve conduction tests) Review of prior analgesic pharmacotherapies. Reviewing PMP Interpreting ordered tests (e.g., lab work, imaging, nerve conduction tests) Performing post-procedure evaluations, including interpretation of diagnostic  procedures Obtaining and/or reviewing separately obtained history Performing a medically appropriate examination and/or evaluation Counseling and educating the patient/family/caregiver Ordering medications, tests, or procedures Referring and communicating with other health care professionals (when not separately reported) Documenting clinical information in the electronic or other health record Independently interpreting results (not separately reported) and communicating results to the patient/ family/caregiver Care coordination (not separately reported)  Note by: Oswaldo Done, MD Date: 09/22/2022; Time: 2:40 PM

## 2022-09-24 ENCOUNTER — Encounter: Payer: Self-pay | Admitting: Physician Assistant

## 2022-09-24 ENCOUNTER — Ambulatory Visit (INDEPENDENT_AMBULATORY_CARE_PROVIDER_SITE_OTHER): Payer: 59 | Admitting: Physician Assistant

## 2022-09-24 VITALS — BP 120/75 | HR 88 | Temp 99.5°F | Ht 74.0 in | Wt 237.6 lb

## 2022-09-24 DIAGNOSIS — L700 Acne vulgaris: Secondary | ICD-10-CM | POA: Diagnosis not present

## 2022-09-24 MED ORDER — BENZOYL PEROXIDE WASH 5 % EX LIQD
Freq: Two times a day (BID) | CUTANEOUS | 6 refills | Status: DC
Start: 2022-09-24 — End: 2023-03-31

## 2022-09-24 MED ORDER — TRETINOIN 0.025 % EX CREA
TOPICAL_CREAM | Freq: Every day | CUTANEOUS | 1 refills | Status: DC
Start: 2022-09-24 — End: 2023-03-31

## 2022-09-24 NOTE — Progress Notes (Signed)
Acute Office Visit   Patient: Darren Allen   DOB: 01-04-1966   57 y.o. Male  MRN: [id number] Visit Date: 09/24/2022  Today's healthcare provider: Oswaldo Conroy Nyliah Nierenberg, PA-C  Introduced myself to the patient as a Secondary school teacher and provided education on APPs in clinical practice.    Chief Complaint  Patient presents with   Acne    Patient says he notices Acne when he eats tomatoes. Patient says he notices the outbreak on his forehead, cheeks and his back. Patient declines using any medications over the counter as he felt they did not help in the past.    Subjective    HPI HPI     Acne    Additional comments: Patient says he notices Acne when he eats tomatoes. Patient says he notices the outbreak on his forehead, cheeks and his back. Patient declines using any medications over the counter as he felt they did not help in the past.       Last edited by Malen Gauze, CMA on 09/24/2022 10:10 AM.       Patient states he is wanting to try an acne cream  He states he mainly gets acne in the summer time from sweating and sometimes from his dietary intake He would like to try clindamycin- benzoyl peroxide and tretinoin creams for this  He states he tries to wear sunscreen when he will be outside and wears large hats to block sun from face     Medications: Outpatient Medications Prior to Visit  Medication Sig   acetaminophen (TYLENOL) 500 MG tablet Take 2 tablets (1,000 mg total) by mouth every 8 (eight) hours.   amitriptyline (ELAVIL) 10 MG tablet Take 40 mg by mouth at bedtime.   ascorbic acid (VITAMIN C) 500 MG tablet Take by mouth daily.   EPINEPHrine 0.3 mg/0.3 mL IJ SOAJ injection Inject 0.3 mg into the muscle as needed.   ferrous sulfate 325 (65 FE) MG tablet Take 325 mg by mouth daily with breakfast.   fluticasone (FLONASE) 50 MCG/ACT nasal spray Place into both nostrils.   gabapentin (NEURONTIN) 600 MG tablet Take 600 mg by mouth 3 (three) times daily.   hydrochlorothiazide  (HYDRODIURIL) 25 MG tablet Take 1 tablet (25 mg total) by mouth daily.   HYDROcodone-acetaminophen (NORCO/VICODIN) 5-325 MG tablet Take 1 tablet by mouth 2 (two) times daily as needed. Must last 30 days.   HYDROcodone-acetaminophen (NORCO/VICODIN) 5-325 MG tablet Take 1 tablet by mouth 2 (two) times daily as needed. Must last 30 days.   [START ON 10/24/2022] HYDROcodone-acetaminophen (NORCO/VICODIN) 5-325 MG tablet Take 1 tablet by mouth 2 (two) times daily as needed. Must last 30 days.   insulin isophane & regular human (HUMULIN 70/30 KWIKPEN) (70-30) 100 UNIT/ML KwikPen Inject 58 Units into the skin 2 (two) times daily with a meal. 60 in the a.m. and 40u qhs   ipratropium-albuterol (DUONEB) 0.5-2.5 (3) MG/3ML SOLN Take by nebulization.   Magnesium Oxide -Mg Supplement 500 MG TABS Take 1 tablet by mouth daily.   metFORMIN (GLUCOPHAGE-XR) 500 MG 24 hr tablet TAKE 4 TABLETS(2000 MG) BY MOUTH DAILY   montelukast (SINGULAIR) 10 MG tablet Take 10 mg by mouth daily.   naloxone (NARCAN) nasal spray 4 mg/0.1 mL Place 1 spray into the nose as needed for up to 365 doses (for opioid-induced respiratory depresssion). In case of emergency (overdose), spray once into each nostril. If no response within 3 minutes, repeat application and call 911.  NEEDLE, DISP, 18 G (BD SAFETYGLIDE NEEDLE) 18G X 1-1/2" MISC 1 mg by Does not apply route every 7 (seven) days.   NEEDLE, DISP, 21 G (BD SAFETYGLIDE NEEDLE) 21G X 1" MISC 1 mg by Does not apply route every 7 (seven) days.   ondansetron (ZOFRAN) 4 MG tablet Take 1 tablet (4 mg total) by mouth every 8 (eight) hours as needed for nausea or vomiting.   PROAIR HFA 108 (90 Base) MCG/ACT inhaler Inhale 2 puffs into the lungs every 6 (six) hours as needed.   rosuvastatin (CRESTOR) 40 MG tablet TAKE 1 TABLET(40 MG) BY MOUTH DAILY   Semaglutide, 2 MG/DOSE, 8 MG/3ML SOPN Inject into the skin.   tadalafil (CIALIS) 5 MG tablet Take 1 tablet (5 mg total) by mouth daily as needed for  erectile dysfunction.   tamsulosin (FLOMAX) 0.4 MG CAPS capsule Take 1 capsule (0.4 mg total) by mouth daily.   testosterone cypionate (DEPOTESTOSTERONE CYPIONATE) 200 MG/ML injection Inject 1 mL (200 mg total) into the muscle every 14 (fourteen) days. Inject 1 cc every 10 days   TRELEGY ELLIPTA 200-62.5-25 MCG/INH AEPB Inhale 1 puff into the lungs daily.   triamcinolone cream (KENALOG) 0.1 % Apply 1 Application topically 2 (two) times daily.   HYDROcodone-acetaminophen (NORCO/VICODIN) 5-325 MG tablet Take 1 tablet by mouth 2 (two) times daily as needed. Must last 30 days.   No facility-administered medications prior to visit.    Review of Systems  Skin:        Acne along face, neck and back        Objective    BP 120/75   Pulse 88   Temp 99.5 F (37.5 C) (Oral)   Ht 6\' 2"  (1.88 m)   Wt 237 lb 9.6 oz (107.8 kg)   SpO2 97%   BMI 30.51 kg/m    Physical Exam Vitals reviewed.  Constitutional:      General: He is awake.     Appearance: Normal appearance. He is well-developed and well-groomed.  HENT:     Head: Normocephalic and atraumatic.  Pulmonary:     Effort: Pulmonary effort is normal.  Skin:    General: Skin is warm and dry.     Findings: Acne present.     Comments: Patient has moderate severity acne along both cheeks and forehead with mixture of closed comedones and apparent cystic acne formations.   Neurological:     Mental Status: He is alert.  Psychiatric:        Attention and Perception: Attention and perception normal.        Mood and Affect: Mood and affect normal.        Speech: Speech normal.        Behavior: Behavior is cooperative.       No results found for any visits on 09/24/22.  Assessment & Plan      No follow-ups on file.     Problem List Items Addressed This Visit       Musculoskeletal and Integument   Acne vulgaris - Primary    Chronic per patient, recurrent  He reports acne flares in the summer and with ingestion of certain foods   He states a family member told him about Clindamycin-benzoyl peroxide cream and tretinoin cream and he would like to try these as OTC products have not provided much relief Will try Benzoyl peroxide wash BID and tretinoin 0.025% QHS cream to assist with concerns Discussed that these medications can make him more susceptible  to sunburn and irritation so we reviewed taking appropriate measures with clothing coverage, sunscreen and hats Recommend giving this regimen at least 8 weeks for effects to be noticable Follow up as needed for progressing or persistent symptoms       Relevant Medications   tretinoin (RETIN-A) 0.025 % cream   benzoyl peroxide 5 % external liquid   Other Visit Diagnoses     Acne comedone       Relevant Medications   tretinoin (RETIN-A) 0.025 % cream   benzoyl peroxide 5 % external liquid        No follow-ups on file.   I, Lajean Boese E Reginae Wolfrey, PA-C, have reviewed all documentation for this visit. The documentation on 09/24/22 for the exam, diagnosis, procedures, and orders are all accurate and complete.   Jacquelin Hawking, MHS, PA-C [facility]

## 2022-09-24 NOTE — Assessment & Plan Note (Signed)
Chronic per patient, recurrent  He reports acne flares in the summer and with ingestion of certain foods  He states a family member told him about Clindamycin-benzoyl peroxide cream and tretinoin cream and he would like to try these as OTC products have not provided much relief Will try Benzoyl peroxide wash BID and tretinoin 0.025% QHS cream to assist with concerns Discussed that these medications can make him more susceptible to sunburn and irritation so we reviewed taking appropriate measures with clothing coverage, sunscreen and hats Recommend giving this regimen at least 8 weeks for effects to be noticable Follow up as needed for progressing or persistent symptoms

## 2022-09-28 ENCOUNTER — Other Ambulatory Visit: Payer: Self-pay | Admitting: Urology

## 2022-09-28 DIAGNOSIS — E291 Testicular hypofunction: Secondary | ICD-10-CM

## 2022-09-29 ENCOUNTER — Other Ambulatory Visit: Payer: 59

## 2022-09-29 ENCOUNTER — Encounter: Payer: Self-pay | Admitting: Urology

## 2022-10-12 ENCOUNTER — Other Ambulatory Visit: Payer: 59

## 2022-10-12 DIAGNOSIS — E291 Testicular hypofunction: Secondary | ICD-10-CM

## 2022-10-24 ENCOUNTER — Other Ambulatory Visit: Payer: Self-pay | Admitting: Nurse Practitioner

## 2022-10-27 NOTE — Telephone Encounter (Signed)
Requested Prescriptions  Refused Prescriptions Disp Refills   lisinopril (ZESTRIL) 20 MG tablet [Pharmacy Med Name: LISINOPRIL 20MG  TABLETS] 90 tablet 1    Sig: TAKE 1 TABLET(20 MG) BY MOUTH DAILY     Cardiovascular:  ACE Inhibitors Passed - 10/24/2022 11:22 PM      Passed - Cr in normal range and within 180 days    Creatinine  Date Value Ref Range Status  03/13/2014 1.08 0.60 - 1.30 mg/dL Final   Creatinine, Ser  Date Value Ref Range Status  05/22/2022 1.22 0.76 - 1.27 mg/dL Final         Passed - K in normal range and within 180 days    Potassium  Date Value Ref Range Status  05/22/2022 4.3 3.5 - 5.2 mmol/L Final  03/13/2014 4.4 3.5 - 5.1 mmol/L Final         Passed - Patient is not pregnant      Passed - Last BP in normal range    BP Readings from Last 1 Encounters:  09/24/22 120/75         Passed - Valid encounter within last 6 months    Recent Outpatient Visits           1 month ago Acne vulgaris   [facility] Mecum, Erin E, PA-C   2 months ago Hypertension associated with diabetes [facility])   [facility] Larae Grooms, NP   4 months ago Type 2 diabetes mellitus with diabetic neuropathy, without long-term current use of insulin (HCC)   [facility] Albia, Megan P, DO   5 months ago Hypertension associated with diabetes [facility])   [facility] Larae Grooms, NP   6 months ago Hypertension associated with diabetes [facility])   [facility] Larae Grooms, NP       Future Appointments             In 4 months Larae Grooms, NP [facility]

## 2022-11-06 ENCOUNTER — Other Ambulatory Visit: Payer: Self-pay | Admitting: Student

## 2022-11-06 DIAGNOSIS — R29810 Facial weakness: Secondary | ICD-10-CM

## 2022-11-07 ENCOUNTER — Other Ambulatory Visit: Payer: 59

## 2022-11-12 NOTE — Progress Notes (Unsigned)
PROVIDER NOTE: Information contained herein reflects review and annotations entered in association with encounter. Interpretation of such information and data should be left to medically-trained personnel. Information provided to patient can be located elsewhere in the medical record under "Patient Instructions". Document created using STT-dictation technology, any transcriptional errors that may result from process are unintentional.    Patient: Darren Allen  Service Category: E/M  Provider: Oswaldo Done, MD  DOB: 12/13/1965  DOS: 11/18/2022  Referring Provider: Larae Grooms, NP  MRN: [id number]  Specialty: Interventional Pain Management  PCP: Larae Grooms, NP  Type: Established Patient  Setting: Ambulatory outpatient    Location: Office  Delivery: Face-to-face     HPI  [name], a 57 y.o. year old male, is here today because of his Chronic pain syndrome [G89.4]. [name] primary complain today is No chief complaint on file.  Pertinent problems: [name] has Ankle fracture; Neuropathy; Diabetic peripheral neuropathy (HCC); Gout; Chronic ankle pain (Bilateral); Chronic low back pain (1ry area of Pain) (Bilateral) (R>L) w/o sciatica; Other acquired hammer toe; Chronic pain syndrome; Lumbar facet syndrome; Lumbosacral radiculopathy at S1 (Left); Chronic feet pain (2ry area of Pain) (Bilateral); Chronic ankle pain (Left); Abnormal NCS (nerve conduction studies) (02/20/2020); Abnormal MRI, lumbar spine (03/11/2020); DDD (degenerative disc disease), lumbosacral; Lumbosacral lateral recess stenosis (Left: L5-S1); Chronic low back pain (Bilateral) w/ sciatica (Left); and Abnormal MRI, thoracic spine (07/05/2020) on their pertinent problem list. Pain Assessment: Severity of   is reported as a  /10. Location:    / . Onset:  . Quality:  . Timing:  . Modifying factor(s):  Marland Kitchen Vitals:  vitals were not taken for this visit.  BMI: Estimated body mass index is 30.51 kg/m as calculated  from the following:   Height as of 09/24/22: 6\' 2"  (1.88 m).   Weight as of 09/24/22: 237 lb 9.6 oz (107.8 kg). Last encounter: 09/22/2022. Last procedure: 08/27/2022.  Reason for encounter: medication management. ***  RTCB: 02/21/2023   Pharmacotherapy Assessment  Analgesic: Hydrocodone/APAP 5/325 tablet, 1 tab p.o. twice daily (#60) MME/day: 10 mg/day   Monitoring: Hydro PMP: PDMP reviewed during this encounter.       Pharmacotherapy: No side-effects or adverse reactions reported. Compliance: No problems identified. Effectiveness: Clinically acceptable.  No notes on file  No results found for: "CBDTHCR" No results found for: "D8THCCBX" No results found for: "D9THCCBX"  UDS:  Summary  Date Value Ref Range Status  08/24/2022 Note  Final    Comment:    ==================================================================== [employer] [facility] (MW) ==================================================================== Test                             Result       Flag       Units  Drug Present and Declared for Prescription Verification   Hydrocodone                    312          EXPECTED   ng/mg creat   Norhydrocodone                 398          EXPECTED   ng/mg creat    Sources of hydrocodone include scheduled prescription medications.    Norhydrocodone is an expected metabolite of hydrocodone.  ==================================================================== Test  Result    Flag   Units      Ref Range   Creatinine              161              mg/dL      >=27 ==================================================================== Declared Medications:  The flagging and interpretation on this report are based on the  following declared medications.  Unexpected results may arise from  inaccuracies in the declared medications.   **Note: The testing scope of this panel includes these medications:   Hydrocodone (Norco)   **Note: The testing scope of this  panel does not include the  following reported medications:   Acetaminophen (Tylenol)  Acetaminophen (Norco)  Albuterol (Proair HFA)  Albuterol (Duoneb)  Amitriptyline (Elavil)  Epinephrine (EpiPen)  Fluticasone (Trelegy)  Fluticasone (Flonase)  Gabapentin (Neurontin)  Hydrochlorothiazide (Hydrodiuril)  Insulin (Humulin)  Ipratropium (Duoneb)  Iron  Lisinopril (Zestril)  Magnesium (Mag-Ox)  Meloxicam (Mobic)  Metformin (Glucophage)  Montelukast (Singulair)  Naloxone (Narcan)  Ondansetron (Zofran)  Prednisone (Deltasone)  Rosuvastatin (Crestor)  Semaglutide  Sulfamethoxazole (Bactrim)  Tadalafil (Cialis)  Tamsulosin (Flomax)  Testosterone  Triamcinolone (Kenalog)  Trimethoprim (Bactrim)  Umeclidinium (Trelegy)  Vilanterol (Trelegy)  Vitamin C ==================================================================== For clinical consultation, please call [phone]. ====================================================================       ROS  Constitutional: Denies any fever or chills Gastrointestinal: No reported hemesis, hematochezia, vomiting, or acute GI distress Musculoskeletal: Denies any acute onset joint swelling, redness, loss of ROM, or weakness Neurological: No reported episodes of acute onset apraxia, aphasia, dysarthria, agnosia, amnesia, paralysis, loss of coordination, or loss of consciousness  Medication Review  EPINEPHrine, Fluticasone-Umeclidin-Vilant, HYDROcodone-acetaminophen, Magnesium Oxide -Mg Supplement, NEEDLE (DISP) 18 G, NEEDLE (DISP) 21 G, Semaglutide (2 MG/DOSE), acetaminophen, albuterol, amitriptyline, ascorbic acid, benzoyl peroxide, ferrous sulfate, fluticasone, gabapentin, hydrochlorothiazide, insulin isophane & regular human KwikPen, ipratropium-albuterol, metFORMIN, montelukast, naloxone, ondansetron, rosuvastatin, tadalafil, tamsulosin, testosterone cypionate, tretinoin, and triamcinolone cream  History Review  Allergy: [name]  is allergic to glipizide, cephalexin, and duloxetine. Drug: [name]  reports no history of drug use. Alcohol:  reports that he does not currently use alcohol after a past usage of about 12.0 standard drinks of alcohol per week. Tobacco:  reports that he has been smoking cigarettes. He has a 17 pack-year smoking history. He has been exposed to tobacco smoke. He has never used smokeless tobacco. Social: Darren Allen  reports that he has been smoking cigarettes. He has a 17 pack-year smoking history. He has been exposed to tobacco smoke. He has never used smokeless tobacco. He reports that he does not currently use alcohol after a past usage of about 12.0 standard drinks of alcohol per week. He reports that he does not use drugs. Medical:  has a past medical history of Allergy, Calculus of kidney (02/04/2015), COPD (chronic obstructive pulmonary disease) (HCC), Diabetes mellitus without complication (HCC), Hypertension, and Neuropathy. Surgical: [name]  has a past surgical history that includes Appendectomy; Incision and drainage perirectal abscess (N/A, 02/04/2015); Rectal exam under anesthesia (02/04/2015); ORIF ankle fracture (Left, 03/23/2017); Syndesmosis repair (Left, 03/23/2017); Colonoscopy with propofol (N/A, 12/31/2021); Esophagogastroduodenoscopy (N/A, 12/31/2021); Kidney stone surgery (Right); lung mass removal (N/A); Xi robotic assisted paraesophageal hernia repair (N/A, 01/20/2022); Insertion of mesh (01/20/2022); and Umbilical hernia repair (01/20/2022). Family: family history includes Alcohol abuse in his father; Cancer in his father; Cancer (age of onset: 70) in his brother; Cancer (age of onset: 39) in his mother; Diabetes in his brother; Heart disease in his  brother and father.  Laboratory Chemistry Profile   Renal Lab Results  Component Value Date   BUN 16 05/22/2022   CREATININE 1.22 05/22/2022   BCR 13 05/22/2022   GFRAA 59 (L) 12/07/2019   GFRNONAA >60 02/15/2022     Hepatic Lab Results  Component Value Date   AST 17 05/22/2022   ALT 21 05/22/2022   ALBUMIN 4.5 05/22/2022   ALKPHOS 84 05/22/2022   AMYLASE 93 02/04/2022   LIPASE 115 (H) 02/04/2022    Electrolytes Lab Results  Component Value Date   NA 137 05/22/2022   K 4.3 05/22/2022   CL 99 05/22/2022   CALCIUM 9.9 05/22/2022   MG 1.8 01/25/2022   PHOS 2.3 (L) 01/23/2022    Bone Lab Results  Component Value Date   25OHVITD1 44 09/08/2021   25OHVITD2 <1.0 09/08/2021   25OHVITD3 43 09/08/2021   TESTOSTERONE 406 10/12/2022    Inflammation (CRP: Acute Phase) (ESR: Chronic Phase) Lab Results  Component Value Date   CRP 1.8 (H) 09/08/2021   ESRSEDRATE 17 09/08/2021   LATICACIDVEN 1.6 02/03/2022         Note: Above Lab results reviewed.  Recent Imaging Review  DG PAIN CLINIC C-ARM 1-60 MIN NO REPORT Fluoro was used, but no Radiologist interpretation will be provided.  Please refer to "NOTES" tab for provider progress note. Note: Reviewed        Physical Exam  General appearance: Well nourished, well developed, and well hydrated. In no apparent acute distress Mental status: Alert, oriented x 3 (person, place, & time)       Respiratory: No evidence of acute respiratory distress Eyes: PERLA Vitals: There were no vitals taken for this visit. BMI: Estimated body mass index is 30.51 kg/m as calculated from the following:   Height as of 09/24/22: 6\' 2"  (1.88 m).   Weight as of 09/24/22: 237 lb 9.6 oz (107.8 kg). Ideal: Patient weight not recorded  Assessment   Diagnosis Status  1. Chronic pain syndrome   2. Chronic low back pain (1ry area of Pain) (Bilateral) (R>L) w/o sciatica   3. Chronic feet pain (2ry area of Pain) (Bilateral)   4. DDD (degenerative disc disease), lumbosacral   5. Lumbar facet joint syndrome   6. Pharmacologic therapy   7. Chronic use of opiate for therapeutic purpose   8. Encounter for medication management   9. Encounter for chronic pain management     Controlled Controlled Controlled   Updated Problems: No problems updated.  Plan of Care  Problem-specific:  No problem-specific Assessment & Plan notes found for this encounter.  [name] has a current medication list which includes the following long-term medication(s): ferrous sulfate, fluticasone, hydrochlorothiazide, hydrocodone-acetaminophen, humulin 70/30 kwikpen, metformin, montelukast, rosuvastatin, tadalafil, and testosterone cypionate.  Pharmacotherapy (Medications Ordered): No orders of the defined types were placed in this encounter.  Orders:  No orders of the defined types were placed in this encounter.  Follow-up plan:   No follow-ups on file.      Interventional Therapies  Risk Factors  Considerations:   WNL   Planned  Pending:   Therapeutic left L5-S1 LESI #3    Under consideration:   Therapeutic left RACZ procedure #1  Diagnostic bilateral lumbar facet MBB #1  Diagnostic/therapeutic left L5 & S1 TFESI #1  Possible spinal cord stimulator trial  Therapeutic left L5-S1 percutaneous discectomy with "[employer] Dekompressor" system    Completed:   Diagnostic midline to left caudal ESI x1 (04/02/2022) (100/100/100/LBP:100/LEP:100)  Diagnostic left L5-S1 LESI x2 (05/26/2022) (1st:100/100/100/LBP:85  LEP:100) (2nd: 01[birth year]59/LBP:60LLEP:100) Therapeutic bilateral Qutenza neurolytic treatment x1 (12/23/2021)  (09/08/2021 & 10/29/2021) referral to physical therapy for evaluation and treatment of low back pain.   Completed by other providers:   EMG/PNCV of lower extremity (02/20/2020) by Dr. Cristopher Peru (generalized sensorimotor peripheral neuropathy; superimposed left S1 radiculopathy)   Therapeutic  Palliative (PRN) options:   None established          Recent Visits Date Type Provider Dept  09/22/22 Office Visit Delano Metz, MD [facility]  08/27/22 Procedure visit Delano Metz, MD [facility]  08/24/22 Office  Visit Delano Metz, MD [facility]  Showing recent visits within past 90 days and meeting all other requirements Future Appointments Date Type Provider Dept  11/18/22 Appointment Delano Metz, MD [facility]  Showing future appointments within next 90 days and meeting all other requirements  I discussed the assessment and treatment plan with the patient. The patient was provided an opportunity to ask questions and all were answered. The patient agreed with the plan and demonstrated an understanding of the instructions.  Patient advised to call back or seek an in-person evaluation if the symptoms or condition worsens.  Duration of encounter: *** minutes.  Total time on encounter, as per AMA guidelines included both the face-to-face and non-face-to-face time personally spent by the physician and/or other qualified health care professional(s) on the day of the encounter (includes time in activities that require the physician or other qualified health care professional and does not include time in activities normally performed by clinical staff). Physician's time may include the following activities when performed: Preparing to see the patient (e.g., pre-charting review of records, searching for previously ordered imaging, lab work, and nerve conduction tests) Review of prior analgesic pharmacotherapies. Reviewing PMP Interpreting ordered tests (e.g., lab work, imaging, nerve conduction tests) Performing post-procedure evaluations, including interpretation of diagnostic procedures Obtaining and/or reviewing separately obtained history Performing a medically appropriate examination and/or evaluation Counseling and educating the patient/family/caregiver Ordering medications, tests, or procedures Referring and communicating with other health care professionals (when not separately reported) Documenting clinical information in the electronic or other health  record Independently interpreting results (not separately reported) and communicating results to the patient/ family/caregiver Care coordination (not separately reported)  Note by: Oswaldo Done, MD Date: 11/18/2022; Time: 2:36 PM

## 2022-11-15 NOTE — Patient Instructions (Incomplete)
 ____________________________________________________________________________________________  Opioid Pain Medication Update  To: All patients taking opioid pain medications. (I.e.: hydrocodone, hydromorphone, oxycodone, oxymorphone, morphine, codeine, methadone, tapentadol, tramadol, buprenorphine, fentanyl, etc.)  Re: Updated review of side effects and adverse reactions of opioid analgesics, as well as new information about long term effects of this class of medications.  Direct risks of long-term opioid therapy are not limited to opioid addiction and overdose. Potential medical risks include serious fractures, breathing problems during sleep, hyperalgesia, immunosuppression, chronic constipation, bowel obstruction, myocardial infarction, and tooth decay secondary to xerostomia.  Unpredictable adverse effects that can occur even if you take your medication correctly: Cognitive impairment, respiratory depression, and death. Most people think that if they take their medication "correctly", and "as instructed", that they will be safe. Nothing could be farther from the truth. In reality, a significant amount of recorded deaths associated with the use of opioids has occurred in individuals that had taken the medication for a long time, and were taking their medication correctly. The following are examples of how this can happen: Patient taking his/her medication for a long time, as instructed, without any side effects, is given a certain antibiotic or another unrelated medication, which in turn triggers a "Drug-to-drug interaction" leading to disorientation, cognitive impairment, impaired reflexes, respiratory depression or an untoward event leading to serious bodily harm or injury, including death.  Patient taking his/her medication for a long time, as instructed, without any side effects, develops an acute impairment of liver and/or kidney function. This will lead to a rapid inability of the body to  breakdown and eliminate their pain medication, which will result in effects similar to an "overdose", but with the same medicine and dose that they had always taken. This again may lead to disorientation, cognitive impairment, impaired reflexes, respiratory depression or an untoward event leading to serious bodily harm or injury, including death.  A similar problem will occur with patients as they grow older and their liver and kidney function begins to decrease as part of the aging process.  Background information: Historically, the original case for using long-term opioid therapy to treat chronic noncancer pain was based on safety assumptions that subsequent experience has called into question. In 1996, the American Pain Society and the American Academy of Pain Medicine issued a consensus statement supporting long-term opioid therapy. This statement acknowledged the dangers of opioid prescribing but concluded that the risk for addiction was low; respiratory depression induced by opioids was short-lived, occurred mainly in opioid-naive patients, and was antagonized by pain; tolerance was not a common problem; and efforts to control diversion should not constrain opioid prescribing. This has now proven to be wrong. Experience regarding the risks for opioid addiction, misuse, and overdose in community practice has failed to support these assumptions.  According to the Centers for Disease Control and Prevention, fatal overdoses involving opioid analgesics have increased sharply over the past decade. Currently, more than 96,700 people die from drug overdoses every year. Opioids are a factor in 7 out of every 10 overdose deaths. Deaths from drug overdose have surpassed motor vehicle accidents as the leading cause of death for individuals between the ages of 80 and 61.  Clinical data suggest that neuroendocrine dysfunction may be very common in both men and women, potentially causing hypogonadism, erectile  dysfunction, infertility, decreased libido, osteoporosis, and depression. Recent studies linked higher opioid dose to increased opioid-related mortality. Controlled observational studies reported that long-term opioid therapy may be associated with increased risk for cardiovascular events. Subsequent meta-analysis concluded  that the safety of long-term opioid therapy in elderly patients has not been proven.   Side Effects and adverse reactions: Common side effects: Drowsiness (sedation). Dizziness. Nausea and vomiting. Constipation. Physical dependence -- Dependence often manifests with withdrawal symptoms when opioids are discontinued or decreased. Tolerance -- As you take repeated doses of opioids, you require increased medication to experience the same effect of pain relief. Respiratory depression -- This can occur in healthy people, especially with higher doses. However, people with COPD, asthma or other lung conditions may be even more susceptible to fatal respiratory impairment.  Uncommon side effects: An increased sensitivity to feeling pain and extreme response to pain (hyperalgesia). Chronic use of opioids can lead to this. Delayed gastric emptying (the process by which the contents of your stomach are moved into your small intestine). Muscle rigidity. Immune system and hormonal dysfunction. Quick, involuntary muscle jerks (myoclonus). Arrhythmia. Itchy skin (pruritus). Dry mouth (xerostomia).  Long-term side effects: Chronic constipation. Sleep-disordered breathing (SDB). Increased risk of bone fractures. Hypothalamic-pituitary-adrenal dysregulation. Increased risk of overdose.  RISKS: Respiratory depression and death: Opioids increase the risk of respiratory depression and death.  Drug-to-drug interactions: Opioids are relatively contraindicated in combination with benzodiazepines, sleep inducers, and other central nervous system depressants. Other classes of medications  (i.e.: certain antibiotics and even over-the-counter medications) may also trigger or induce respiratory depression in some patients.  Medical conditions: Patients with pre-existing respiratory problems are at higher risk of respiratory failure and/or depression when in combination with opioid analgesics. Opioids are relatively contraindicated in some medical conditions such as central sleep apnea.   Fractures and Falls:  Opioids increase the risk and incidence of falls. This is of particular importance in elderly patients.  Endocrine System:  Long-term administration is associated with endocrine abnormalities (endocrinopathies). (Also known as Opioid-induced Endocrinopathy) Influences on both the hypothalamic-pituitary-adrenal axis?and the hypothalamic-pituitary-gonadal axis have been demonstrated with consequent hypogonadism and adrenal insufficiency in both sexes. Hypogonadism and decreased levels of dehydroepiandrosterone sulfate have been reported in men and women. Endocrine effects include: Amenorrhoea in women (abnormal absence of menstruation) Reduced libido in both sexes Decreased sexual function Erectile dysfunction in men Hypogonadisms (decreased testicular function with shrinkage of testicles) Infertility Depression and fatigue Loss of muscle mass Anxiety Depression Immune suppression Hyperalgesia Weight gain Anemia Osteoporosis Patients (particularly women of childbearing age) should avoid opioids. There is insufficient evidence to recommend routine monitoring of asymptomatic patients taking opioids in the long-term for hormonal deficiencies.  Immune System: Human studies have demonstrated that opioids have an immunomodulating effect. These effects are mediated via opioid receptors both on immune effector cells and in the central nervous system. Opioids have been demonstrated to have adverse effects on antimicrobial response and anti-tumour surveillance. Buprenorphine has  been demonstrated to have no impact on immune function.  Opioid Induced Hyperalgesia: Human studies have demonstrated that prolonged use of opioids can lead to a state of abnormal pain sensitivity, sometimes called opioid induced hyperalgesia (OIH). Opioid induced hyperalgesia is not usually seen in the absence of tolerance to opioid analgesia. Clinically, hyperalgesia may be diagnosed if the patient on long-term opioid therapy presents with increased pain. This might be qualitatively and anatomically distinct from pain related to disease progression or to breakthrough pain resulting from development of opioid tolerance. Pain associated with hyperalgesia tends to be more diffuse than the pre-existing pain and less defined in quality. Management of opioid induced hyperalgesia requires opioid dose reduction.  Cancer: Chronic opioid therapy has been associated with an increased risk of cancer  among noncancer patients with chronic pain. This association was more evident in chronic strong opioid users. Chronic opioid consumption causes significant pathological changes in the small intestine and colon. Epidemiological studies have found that there is a link between opium dependence and initiation of gastrointestinal cancers. Cancer is the second leading cause of death after cardiovascular disease. Chronic use of opioids can cause multiple conditions such as GERD, immunosuppression and renal damage as well as carcinogenic effects, which are associated with the incidence of cancers.   Mortality: Long-term opioid use has been associated with increased mortality among patients with chronic non-cancer pain (CNCP).  Prescription of long-acting opioids for chronic noncancer pain was associated with a significantly increased risk of all-cause mortality, including deaths from causes other than overdose.  Reference: Von Korff M, Kolodny A, Deyo RA, Chou R. Long-term opioid therapy reconsidered. Ann Intern Med. 2011  Sep 6;155(5):325-8. doi: 10.7326/[id number]. PMID: 64403474; PMCID: [id number]. Randon Goldsmith, Hayward RA, Dunn KM, [place] KP. Risk of adverse events in patients prescribed long-term opioids: A cohort study in the [place] [facility] Research Datalink. Eur J Pain. 2019 May;23(5):908-922. doi: 10.1002/ejp.1357. Epub 2019 Jan 31. PMID: 43329518. Colameco S, Coren JS, Ciervo CA. Continuous opioid treatment for chronic noncancer pain: a time for moderation in prescribing. Postgrad Med. 2009 Jul;121(4):61-6. doi: 10.3810/pgm.2009.07.2032. PMID: 84166063. William Hamburger RN, [place] SD, Blazina I, Cristopher Peru, Bougatsos C, Deyo RA. The effectiveness and risks of long-term opioid therapy for chronic pain: a systematic review for a [employer] of [employer] Pathways to [employer]. Ann Intern Med. 2015 Feb 17;162(4):276-86. doi: 10.7326/M14-2559. PMID: 01601093. Caryl Bis [facility], Makuc DM. NCHS Data Brief No. 22. [place]: Centers for Disease Control and Prevention; 2009. Sep, Increase in Fatal Poisonings Involving Opioid Analgesics in the [place], 1999-2006. Song IA, Choi HR, Oh TK. Long-term opioid use and mortality in patients with chronic non-cancer pain: Ten-year follow-up study in [place] from 2010 through 2019. EClinicalMedicine. 2022 Jul 18;51:101558. doi: 10.1016/j.eclinm.2022.235573. PMID: 22025427; PMCID: [id number]. Huser, W., Schubert, T., Vogelmann, T. et al. All-cause mortality in patients with long-term opioid therapy compared with non-opioid analgesics for chronic non-cancer pain: a database study. BMC Med 18, 162 (2020). http://lester.info/ Rashidian H, Karie Kirks, Malekzadeh R, Haghdoost AA. An Ecological Study of the Association between Opiate Use and Incidence of Cancers. Addict Health. 2016 Fall;8(4):252-260. PMID: 51761607; PMCID: [id number].  Our Goal: Our goal is to control your  pain with means other than the use of opioid pain medications.  Our Recommendation: Talk to your physician about coming off of these medications. We can assist you with the tapering down and stopping these medicines. Based on the new information, even if you cannot completely stop the medication, a decrease in the dose may be associated with a lesser risk. Ask for other means of controlling the pain. Decrease or eliminate those factors that significantly contribute to your pain such as smoking, obesity, and a diet heavily tilted towards "inflammatory" nutrients.  Last Updated: 09/21/2022   ____________________________________________________________________________________________     ____________________________________________________________________________________________  National Pain Medication Shortage  The [place] is experiencing worsening drug shortages. These have had a negative widespread effect on patient care and treatment. Not expected to improve any time soon. Predicted to last past 2029.   Drug shortage list (generic names) Oxycodone IR Oxycodone/APAP Oxymorphone IR Hydromorphone Hydrocodone/APAP Morphine  Where is the problem?  Manufacturing and supply level.  Will this shortage affect you?  Only if you  take any of the above pain medications.  How? You may be unable to fill your prescription.  Your pharmacist may offer a "partial fill" of your prescription. (Warning: Do not accept partial fills.) Prescriptions partially filled cannot be transferred to another pharmacy. Read our Medication Rules and Regulation. Depending on how much medicine you are dependent on, you may experience withdrawals when unable to get the medication.  Recommendations: Consider ending your dependence on opioid pain medications. Ask your pain specialist to assist you with the process. Consider switching to a medication currently not in shortage, such as Buprenorphine. Talk to your pain  specialist about this option. Consider decreasing your pain medication requirements by managing tolerance thru "Drug Holidays". This may help minimize withdrawals, should you run out of medicine. Control your pain thru the use of non-pharmacological interventional therapies.   Your prescriber: Prescribers cannot be blamed for shortages. Medication manufacturing and supply issues cannot be fixed by the prescriber.   NOTE: The prescriber is not responsible for supplying the medication, or solving supply issues. Work with your pharmacist to solve it. The patient is responsible for the decision to take or continue taking the medication and for identifying and securing a legal supply source. By law, supplying the medication is the job and responsibility of the pharmacy. The prescriber is responsible for the evaluation, monitoring, and prescribing of these medications.   Prescribers will NOT: Re-issue prescriptions that have been partially filled. Re-issue prescriptions already sent to a pharmacy.  Re-send prescriptions to a different pharmacy because yours did not have your medication. Ask pharmacist to order more medicine or transfer the prescription to another pharmacy. (Read below.)  New 2023 regulation: "November 14, 2021 Revised Regulation Allows DEA-Registered Pharmacies to Transfer Electronic Prescriptions at a Patient's Request DEA Headquarters Division - Public Information Office Patients now have the ability to request their electronic prescription be transferred to another pharmacy without having to go back to their practitioner to initiate the request. This revised regulation went into effect on Monday, November 10, 2021.     At a patient's request, a DEA-registered retail pharmacy can now transfer an electronic prescription for a controlled substance (schedules II-V) to another DEA-registered retail pharmacy. Prior to this change, patients would have to go through their practitioner to  cancel their prescription and have it re-issued to a different pharmacy. The process was taxing and time consuming for both patients and practitioners.    The Drug Enforcement Administration [facility]) published its intent to revise the process for transferring electronic prescriptions on February 02, 2020.  The final rule was published in the federal register on October 09, 2021 and went into effect 30 days later.  Under the final rule, a prescription can only be transferred once between pharmacies, and only if allowed under existing state or other applicable law. The prescription must remain in its electronic form; may not be altered in any way; and the transfer must be communicated directly between two licensed pharmacists. It's important to note, any authorized refills transfer with the original prescription, which means the entire prescription will be filled at the same pharmacy".  Reference: HugeHand.is [facility] website announcement)  [email or web address] [employer] of Justice)   [employer] / Vol. 88, No. 143 / Thursday, October 09, 2021 / Rules and Regulations DEPARTMENT OF JUSTICE  Drug Enforcement Administration  21 CFR Part 1306  [Docket No. [id number]]  RIN [id number] Transfer of Electronic Prescriptions for Schedules II-V Controlled Substances Between Pharmacies for Initial Filling  ____________________________________________________________________________________________  ____________________________________________________________________________________________  Transfer of Pain Medication between Pharmacies  Re: 2023 DEA Clarification on existing regulation  Published on DEA Website: November 14, 2021  Title: Revised Regulation Allows DEA-Registered Pharmacies to Electrical engineer Prescriptions at a Patient's  Request DEA Headquarters Division - [employer]  "Patients now have the ability to request their electronic prescription be transferred to another pharmacy without having to go back to their practitioner to initiate the request. This revised regulation went into effect on Monday, November 10, 2021.     At a patient's request, a DEA-registered retail pharmacy can now transfer an electronic prescription for a controlled substance (schedules II-V) to another DEA-registered retail pharmacy. Prior to this change, patients would have to go through their practitioner to cancel their prescription and have it re-issued to a different pharmacy. The process was taxing and time consuming for both patients and practitioners.    The Drug Enforcement Administration [facility]) published its intent to revise the process for transferring electronic prescriptions on February 02, 2020.  The final rule was published in the federal register on October 09, 2021 and went into effect 30 days later.  Under the final rule, a prescription can only be transferred once between pharmacies, and only if allowed under existing state or other applicable law. The prescription must remain in its electronic form; may not be altered in any way; and the transfer must be communicated directly between two licensed pharmacists. It's important to note, any authorized refills transfer with the original prescription, which means the entire prescription will be filled at the same pharmacy."    REFERENCES: 1. DEA website announcement [email or web address]  2. Department of Justice website  [email or web address].pdf  3. DEPARTMENT OF JUSTICE Drug Enforcement Administration 21 CFR Part 1306 [Docket No. [id number]] RIN [id number] "Transfer of Electronic Prescriptions for Schedules II-V Controlled Substances  Between Pharmacies for Initial Filling"  ____________________________________________________________________________________________     _______________________________________________________________________  Medication Rules  Purpose: To inform patients, and their family members, of our medication rules and regulations.  Applies to: All patients receiving prescriptions from our practice (written or electronic).  Pharmacy of record: This is the pharmacy where your electronic prescriptions will be sent. Make sure we have the correct one.  Electronic prescriptions: In compliance with the [facility] Strengthen Opioid Misuse Prevention (STOP) Act of 2017 (Session Conni Elliot [phone]), effective March 16, 2018, all controlled substances must be electronically prescribed. Written prescriptions, faxing, or calling prescriptions to a pharmacy will no longer be done.  Prescription refills: These will be provided only during in-person appointments. No medications will be renewed without a "face-to-face" evaluation with your provider. Applies to all prescriptions.  NOTE: The following applies primarily to controlled substances (Opioid* Pain Medications).   Type of encounter (visit): For patients receiving controlled substances, face-to-face visits are required. (Not an option and not up to the patient.)  Patient's responsibilities: Pain Pills: Bring all pain pills to every appointment (except for procedure appointments). Pill Bottles: Bring pills in original pharmacy bottle. Bring bottle, even if empty. Always bring the bottle of the most recent fill.  Medication refills: You are responsible for knowing and keeping track of what medications you are taking and when is it that you will need a refill. The day before your appointment: write a list of all prescriptions that need to be refilled. The day of the appointment: give the list to the admitting nurse. Prescriptions will be written only  during appointments. No prescriptions will be written on procedure days. If you forget a  medication: it will not be "Called in", "Faxed", or "electronically sent". You will need to get another appointment to get these prescribed. No early refills. Do not call asking to have your prescription filled early. Partial  or short prescriptions: Occasionally your pharmacy may not have enough pills to fill your prescription.  NEVER ACCEPT a partial fill or a prescription that is short of the total amount of pills that you were prescribed.  With controlled substances the law allows 72 hours for the pharmacy to complete the prescription.  If the prescription is not completed within 72 hours, the pharmacist will require a new prescription to be written. This means that you will be short on your medicine and we WILL NOT send another prescription to complete your original prescription.  Instead, request the pharmacy to send a carrier to a nearby branch to get enough medication to provide you with your full prescription. Prescription Accuracy: You are responsible for carefully inspecting your prescriptions before leaving our office. Have the discharge nurse carefully go over each prescription with you, before taking them home. Make sure that your name is accurately spelled, that your address is correct. Check the name and dose of your medication to make sure it is accurate. Check the number of pills, and the written instructions to make sure they are clear and accurate. Make sure that you are given enough medication to last until your next medication refill appointment. Taking Medication: Take medication as prescribed. When it comes to controlled substances, taking less pills or less frequently than prescribed is permitted and encouraged. Never take more pills than instructed. Never take the medication more frequently than prescribed.  Inform other Doctors: Always inform, all of your healthcare providers, of all the  medications you take. Pain Medication from other Providers: You are not allowed to accept any additional pain medication from any other Doctor or Healthcare provider. There are two exceptions to this rule. (see below) In the event that you require additional pain medication, you are responsible for notifying us, as stated below. Cough Medicine: Often these contain an opioid, such as codeine or hydrocodone. Never accept or take cough medicine containing these opioids if you are already taking an opioid* medication. The combination may cause respiratory failure and death. Medication Agreement: You are responsible for carefully reading and following our Medication Agreement. This must be signed before receiving any prescriptions from our practice. Safely store a copy of your signed Agreement. Violations to the Agreement will result in no further prescriptions. (Additional copies of our Medication Agreement are available upon request.) Laws, Rules, & Regulations: All patients are expected to follow all [address] and [employer], [employer], Rules, [place] [place]. Ignorance of the Laws does not constitute a valid excuse.  Illegal drugs and Controlled Substances: The use of illegal substances (including, but not limited to marijuana and its derivatives) and/or the illegal use of any controlled substances is strictly prohibited. Violation of this rule may result in the immediate and permanent discontinuation of any and all prescriptions being written by our practice. The use of any illegal substances is prohibited. Adopted CDC guidelines & recommendations: Target dosing levels will be at or below 60 MME/day. Use of benzodiazepines** is not recommended.  Exceptions: There are only two exceptions to the rule of not receiving pain medications from other Healthcare Providers. Exception #1 (Emergencies): In the event of an emergency (i.e.: accident requiring emergency care), you are allowed to receive additional pain  medication. However, you are responsible for: As soon as  you are able, call our office [phone], at any time of the day or night, and leave a message stating your name, the date and nature of the emergency, and the name and dose of the medication prescribed. In the event that your call is answered by a member of our staff, make sure to document and save the date, time, and the name of the person that took your information.  Exception #2 (Planned Surgery): In the event that you are scheduled by another doctor or dentist to have any type of surgery or procedure, you are allowed (for a period no longer than 30 days), to receive additional pain medication, for the acute post-op pain. However, in this case, you are responsible for picking up a copy of our "Post-op Pain Management for Surgeons" handout, and giving it to your surgeon or dentist. This document is available at our office, and does not require an appointment to obtain it. Simply go to our office during business hours (Monday-Thursday from 8:00 AM to 4:00 PM) (Friday 8:00 AM to 12:00 Noon) or if you have a scheduled appointment with [place], prior to your surgery, and ask for it by name. In addition, you are responsible for: calling our office [phone], at any time of the day or night, and leaving a message stating your name, name of your surgeon, type of surgery, and date of procedure or surgery. Failure to comply with your responsibilities may result in termination of therapy involving the controlled substances. Medication Agreement Violation. Following the above rules, including your responsibilities will help you in avoiding a Medication Agreement Violation ("Breaking your Pain Medication Contract").  Consequences:  Not following the above rules may result in permanent discontinuation of medication prescription therapy.  *Opioid medications include: morphine, codeine, oxycodone, oxymorphone, hydrocodone, hydromorphone, meperidine, tramadol,  tapentadol, buprenorphine, fentanyl, methadone. **Benzodiazepine medications include: diazepam (Valium), alprazolam (Xanax), clonazepam (Klonopine), lorazepam (Ativan), clorazepate (Tranxene), chlordiazepoxide (Librium), estazolam (Prosom), oxazepam (Serax), temazepam (Restoril), triazolam (Halcion) (Last updated: 01/06/2022) ______________________________________________________________________    ______________________________________________________________________  Medication Recommendations and Reminders  Applies to: All patients receiving prescriptions (written and/or electronic).  Medication Rules & Regulations: You are responsible for reading, knowing, and following our "Medication Rules" document. These exist for your safety and that of others. They are not flexible and neither are we. Dismissing or ignoring them is an act of "non-compliance" that may result in complete and irreversible termination of such medication therapy. For safety reasons, "non-compliance" will not be tolerated. As with the [place] fundamental legal principle of "ignorance of the law is no defense", we will accept no excuses for not having read and knowing the content of documents provided to you by our practice.  Pharmacy of record:  Definition: This is the pharmacy where your electronic prescriptions will be sent.  We do not endorse any particular pharmacy. It is up to you and your insurance to decide what pharmacy to use.  We do not restrict you in your choice of pharmacy. However, once we write for your prescriptions, we will NOT be re-sending more prescriptions to fix restricted supply problems created by your pharmacy, or your insurance.  The pharmacy listed in the electronic medical record should be the one where you want electronic prescriptions to be sent. If you choose to change pharmacy, simply notify our nursing staff. Changes will be made only during your regular appointments and not over the  phone.  Recommendations: Keep all of your pain medications in a safe place, under lock and key, even  if you live alone. We will NOT replace lost, stolen, or damaged medication. We do not accept "Police Reports" as proof of medications having been stolen. After you fill your prescription, take 1 week's worth of pills and put them away in a safe place. You should keep a separate, properly labeled bottle for this purpose. The remainder should be kept in the original bottle. Use this as your primary supply, until it runs out. Once it's gone, then you know that you have 1 week's worth of medicine, and it is time to come in for a prescription refill. If you do this correctly, it is unlikely that you will ever run out of medicine. To make sure that the above recommendation works, it is very important that you make sure your medication refill appointments are scheduled at least 1 week before you run out of medicine. To do this in an effective manner, make sure that you do not leave the office without scheduling your next medication management appointment. Always ask the nursing staff to show you in your prescription , when your medication will be running out. Then arrange for the receptionist to get you a return appointment, at least 7 days before you run out of medicine. Do not wait until you have 1 or 2 pills left, to come in. This is very poor planning and does not take into consideration that we may need to cancel appointments due to bad weather, sickness, or emergencies affecting our staff. DO NOT ACCEPT A "Partial Fill": If for any reason your pharmacy does not have enough pills/tablets to completely fill or refill your prescription, do not allow for a "partial fill". The law allows the pharmacy to complete that prescription within 72 hours, without requiring a new prescription. If they do not fill the rest of your prescription within those 72 hours, you will need a separate prescription to fill the remaining  amount, which we will NOT provide. If the reason for the partial fill is your insurance, you will need to talk to the pharmacist about payment alternatives for the remaining tablets, but again, DO NOT ACCEPT A PARTIAL FILL, unless you can trust your pharmacist to obtain the remainder of the pills within 72 hours.  Prescription refills and/or changes in medication(s):  Prescription refills, and/or changes in dose or medication, will be conducted only during scheduled medication management appointments. (Applies to both, written and electronic prescriptions.) No refills on procedure days. No medication will be changed or started on procedure days. No changes, adjustments, and/or refills will be conducted on a procedure day. Doing so will interfere with the diagnostic portion of the procedure. No phone refills. No medications will be "called into the pharmacy". No Fax refills. No weekend refills. No Holliday refills. No after hours refills.  Remember:  Business hours are:  Monday to Thursday 8:00 AM to 4:00 PM Provider's Schedule: Delano Metz, MD - Appointments are:  Medication management: Monday and Wednesday 8:00 AM to 4:00 PM Procedure day: Tuesday and Thursday 7:30 AM to 4:00 PM Edward Jolly, MD - Appointments are:  Medication management: Tuesday and Thursday 8:00 AM to 4:00 PM Procedure day: Monday and Wednesday 7:30 AM to 4:00 PM (Last update: 01/06/2022) ______________________________________________________________________   ____________________________________________________________________________________________  Naloxone Nasal Spray  Why am I receiving this medication? [place] STOP ACT requires that all patients taking high dose opioids or at risk of opioids respiratory depression, be prescribed an opioid reversal agent, such as Naloxone (AKA: Narcan).  What is this medication? NALOXONE (  nal OX one) treats opioid overdose, which causes slow or shallow breathing,  severe drowsiness, or trouble staying awake. Call emergency services after using this medication. You may need additional treatment. Naloxone works by reversing the effects of opioids. It belongs to a group of medications called opioid blockers.  COMMON BRAND NAME(S): Kloxxado, Narcan  What should I tell my care team before I take this medication? They need to know if you have any of these conditions: Heart disease Substance use disorder An unusual or allergic reaction to naloxone, other medications, foods, dyes, or preservatives Pregnant or trying to get pregnant Breast-feeding  When to use this medication? This medication is to be used for the treatment of respiratory depression (less than 8 breaths per minute) secondary to opioid overdose.   How to use this medication? This medication is for use in the nose. Lay the person on their back. Support their neck with your hand and allow the head to tilt back before giving the medication. The nasal spray should be given into 1 nostril. After giving the medication, move the person onto their side. Do not remove or test the nasal spray until ready to use. Get emergency medical help right away after giving the first dose of this medication, even if the person wakes up. You should be familiar with how to recognize the signs and symptoms of a narcotic overdose. If more doses are needed, give the additional dose in the other nostril. Talk to your care team about the use of this medication in children. While this medication may be prescribed for children as young as newborns for selected conditions, precautions do apply.  Naloxone Overdosage: If you think you have taken too much of this medicine contact a poison control center or emergency room at once.  NOTE: This medicine is only for you. Do not share this medicine with others.  What if I miss a dose? This does not apply.  What may interact with this medication? This is only used during an  emergency. No interactions are expected during emergency use. This list may not describe all possible interactions. Give your health care provider a list of all the medicines, herbs, non-prescription drugs, or dietary supplements you use. Also tell them if you smoke, drink alcohol, or use illegal drugs. Some items may interact with your medicine.  What should I watch for while using this medication? Keep this medication ready for use in the case of an opioid overdose. Make sure that you have the phone number of your care team and local hospital ready. You may need to have additional doses of this medication. Each nasal spray contains a single dose. Some emergencies may require additional doses. After use, bring the treated person to the nearest hospital or call 911. Make sure the treating care team knows that the person has received a dose of this medication. You will receive additional instructions on what to do during and after use of this medication before an emergency occurs.  What side effects may I notice from receiving this medication? Side effects that you should report to your care team as soon as possible: Allergic reactions--skin rash, itching, hives, swelling of the face, lips, tongue, or throat Side effects that usually do not require medical attention (report these to your care team if they continue or are bothersome): Constipation Dryness or irritation inside the nose Headache Increase in blood pressure Muscle spasms Stuffy nose Toothache This list may not describe all possible side effects. Call your doctor for  medical advice about side effects. You may report side effects to FDA at 1-800-FDA-1088.  Where should I keep my medication? Because this is an emergency medication, you should keep it with you at all times.  Keep out of the reach of children and pets. Store between 20 and 25 degrees C (68 and 77 degrees F). Do not freeze. Throw away any unused medication after the  expiration date. Keep in original box until ready to use.  NOTE: This sheet is a summary. It may not cover all possible information. If you have questions about this medicine, talk to your doctor, pharmacist, or health care provider.   2023 Elsevier/Gold Standard (2020-11-08 00:00:00)  ____________________________________________________________________________________________

## 2022-11-17 ENCOUNTER — Ambulatory Visit
Admission: RE | Admit: 2022-11-17 | Discharge: 2022-11-17 | Disposition: A | Discharge status: Home / Self Care | Payer: 59 | Source: Ambulatory Visit | Attending: Student | Admitting: Student

## 2022-11-17 DIAGNOSIS — R29810 Facial weakness: Secondary | ICD-10-CM

## 2022-11-18 ENCOUNTER — Encounter: Payer: Self-pay | Admitting: Pain Medicine

## 2022-11-18 ENCOUNTER — Ambulatory Visit: Payer: 59 | Attending: Pain Medicine | Admitting: Pain Medicine

## 2022-11-18 VITALS — BP 155/100 | HR 103 | Temp 99.3°F | Resp 18 | Ht 74.0 in | Wt 240.0 lb

## 2022-11-18 DIAGNOSIS — M79671 Pain in right foot: Secondary | ICD-10-CM | POA: Insufficient documentation

## 2022-11-18 DIAGNOSIS — M5137 Other intervertebral disc degeneration, lumbosacral region: Secondary | ICD-10-CM | POA: Diagnosis present

## 2022-11-18 DIAGNOSIS — Z79899 Other long term (current) drug therapy: Secondary | ICD-10-CM | POA: Insufficient documentation

## 2022-11-18 DIAGNOSIS — Z79891 Long term (current) use of opiate analgesic: Secondary | ICD-10-CM | POA: Insufficient documentation

## 2022-11-18 DIAGNOSIS — M545 Low back pain, unspecified: Secondary | ICD-10-CM | POA: Insufficient documentation

## 2022-11-18 DIAGNOSIS — M47816 Spondylosis without myelopathy or radiculopathy, lumbar region: Secondary | ICD-10-CM | POA: Diagnosis present

## 2022-11-18 DIAGNOSIS — G8929 Other chronic pain: Secondary | ICD-10-CM | POA: Diagnosis present

## 2022-11-18 DIAGNOSIS — M79672 Pain in left foot: Secondary | ICD-10-CM | POA: Insufficient documentation

## 2022-11-18 DIAGNOSIS — G894 Chronic pain syndrome: Secondary | ICD-10-CM | POA: Insufficient documentation

## 2022-11-18 MED ORDER — HYDROCODONE-ACETAMINOPHEN 5-325 MG PO TABS
1.0000 | ORAL_TABLET | Freq: Two times a day (BID) | ORAL | 0 refills | Status: DC | PRN
Start: 2022-12-23 — End: 2023-02-17

## 2022-11-18 MED ORDER — HYDROCODONE-ACETAMINOPHEN 5-325 MG PO TABS
1.0000 | ORAL_TABLET | Freq: Two times a day (BID) | ORAL | 0 refills | Status: DC | PRN
Start: 2022-11-23 — End: 2023-02-17

## 2022-11-18 MED ORDER — HYDROCODONE-ACETAMINOPHEN 5-325 MG PO TABS: 1.0000 | ORAL_TABLET | Freq: Two times a day (BID) | ORAL | 0 refills | Status: DC | PRN

## 2022-11-18 NOTE — Progress Notes (Signed)
Nursing Pain Medication Assessment:  Safety precautions to be maintained throughout the outpatient stay will include: orient to surroundings, keep bed in low position, maintain call bell within reach at all times, provide assistance with transfer out of bed and ambulation.  Medication Inspection Compliance: Pill count conducted under aseptic conditions, in front of the patient. Neither the pills nor the bottle was removed from the patient's sight at any time. Once count was completed pills were immediately returned to the patient in their original bottle.  Medication: Hydrocodone/APAP Pill/Patch Count:  15 of 60 pills remain Pill/Patch Appearance: Markings consistent with prescribed medication Bottle Appearance: Standard pharmacy container. Clearly labeled. Filled Date: 08 / 12 / 2024 Last Medication intake:  Today

## 2022-12-09 ENCOUNTER — Encounter: Payer: Self-pay | Admitting: Nurse Practitioner

## 2022-12-09 DIAGNOSIS — L700 Acne vulgaris: Secondary | ICD-10-CM

## 2022-12-21 ENCOUNTER — Encounter: Payer: Self-pay | Admitting: Pain Medicine

## 2022-12-21 NOTE — Progress Notes (Unsigned)
PROVIDER NOTE: Information contained herein reflects review and annotations entered in association with encounter. Interpretation of such information and data should be left to medically-trained personnel. Information provided to patient can be located elsewhere in the medical record under "Patient Instructions". Document created using STT-dictation technology, any transcriptional errors that may result from process are unintentional.    Patient: Darren Allen  Service Category: E/M  Provider: Oswaldo Done, MD  DOB: Jul 20, 1965  DOS: 12/23/2022  Referring Provider: Larae Grooms, NP  MRN: [id number]  Specialty: Interventional Pain Management  PCP: Larae Grooms, NP  Type: Established Patient  Setting: Ambulatory outpatient    Location: Office  Delivery: Face-to-face     HPI  [name], a 57 y.o. year old male, is here today because of his No primary diagnosis found.. [name] primary complain today is No chief complaint on file.  Pertinent problems: [name] has Ankle fracture; Neuropathy; Diabetic peripheral neuropathy (HCC); Gout; Chronic ankle pain (Bilateral); Chronic low back pain (1ry area of Pain) (Bilateral) (R>L) w/o sciatica; Other acquired hammer toe; Chronic pain syndrome; Lumbar facet syndrome; Lumbosacral radiculopathy at S1 (Left); Chronic feet pain (2ry area of Pain) (Bilateral); Chronic ankle pain (Left); Abnormal NCS (nerve conduction studies) (02/20/2020); Abnormal MRI, lumbar spine (03/11/2020); DDD (degenerative disc disease), lumbosacral; Lumbosacral lateral recess stenosis (Left: L5-S1); Chronic low back pain (Bilateral) w/ sciatica (Left); and Abnormal MRI, thoracic spine (07/05/2020) on their pertinent problem list. Pain Assessment: Severity of   is reported as a  /10. Location:    / . Onset:  . Quality:  . Timing:  . Modifying factor(s):  Marland Kitchen Vitals:  vitals were not taken for this visit.  BMI: Estimated body mass index is 30.81 kg/m as calculated  from the following:   Height as of 11/18/22: 6\' 2"  (1.88 m).   Weight as of 11/18/22: 240 lb (108.9 kg). Last encounter: 11/18/2022. Last procedure: 08/27/2022.  Reason for encounter:  *** . ***  Pharmacotherapy Assessment  Analgesic: Hydrocodone/APAP 5/325 tablet, 1 tab p.o. twice daily (#60) MME/day: 10 mg/day   Monitoring: [place] PMP: PDMP reviewed during this encounter.       Pharmacotherapy: No side-effects or adverse reactions reported. Compliance: No problems identified. Effectiveness: Clinically acceptable.  No notes on file  No results found for: "CBDTHCR" No results found for: "D8THCCBX" No results found for: "D9THCCBX"  UDS:  Summary  Date Value Ref Range Status  08/24/2022 Note  Final    Comment:    ==================================================================== [facility] (MW) ==================================================================== Test                             Result       Flag       Units  Drug Present and Declared for Prescription Verification   Hydrocodone                    312          EXPECTED   ng/mg creat   Norhydrocodone                 398          EXPECTED   ng/mg creat    Sources of hydrocodone include scheduled prescription medications.    Norhydrocodone is an expected metabolite of hydrocodone.  ==================================================================== Test  Result    Flag   Units      Ref Range   Creatinine              161              mg/dL      >=16 ==================================================================== Declared Medications:  The flagging and interpretation on this report are based on the  following declared medications.  Unexpected results may arise from  inaccuracies in the declared medications.   **Note: The testing scope of this panel includes these medications:   Hydrocodone (Norco)   **Note: The testing scope of this panel does not include the  following reported  medications:   Acetaminophen (Tylenol)  Acetaminophen (Norco)  Albuterol (Proair HFA)  Albuterol (Duoneb)  Amitriptyline (Elavil)  Epinephrine (EpiPen)  Fluticasone (Trelegy)  Fluticasone (Flonase)  Gabapentin (Neurontin)  Hydrochlorothiazide (Hydrodiuril)  Insulin (Humulin)  Ipratropium (Duoneb)  Iron  Lisinopril (Zestril)  Magnesium (Mag-Ox)  Meloxicam (Mobic)  Metformin (Glucophage)  Montelukast (Singulair)  Naloxone (Narcan)  Ondansetron (Zofran)  Prednisone (Deltasone)  Rosuvastatin (Crestor)  Semaglutide  Sulfamethoxazole (Bactrim)  Tadalafil (Cialis)  Tamsulosin (Flomax)  Testosterone  Triamcinolone (Kenalog)  Trimethoprim (Bactrim)  Umeclidinium (Trelegy)  Vilanterol (Trelegy)  Vitamin C ==================================================================== For clinical consultation, please call [phone]. ====================================================================       ROS  Constitutional: Denies any fever or chills Gastrointestinal: No reported hemesis, hematochezia, vomiting, or acute GI distress Musculoskeletal: Denies any acute onset joint swelling, redness, loss of ROM, or weakness Neurological: No reported episodes of acute onset apraxia, aphasia, dysarthria, agnosia, amnesia, paralysis, loss of coordination, or loss of consciousness  Medication Review  EPINEPHrine, Fluticasone-Umeclidin-Vilant, HYDROcodone-acetaminophen, Magnesium Oxide -Mg Supplement, NEEDLE (DISP) 18 G, NEEDLE (DISP) 21 G, Semaglutide (2 MG/DOSE), acetaminophen, albuterol, amitriptyline, ascorbic acid, benzoyl peroxide, ferrous sulfate, fluticasone, gabapentin, hydrochlorothiazide, insulin isophane & regular human KwikPen, ipratropium-albuterol, metFORMIN, montelukast, naloxone, ondansetron, rosuvastatin, tadalafil, tamsulosin, testosterone cypionate, tretinoin, and triamcinolone cream  History Review  Allergy: [name] is allergic to glipizide, cephalexin, and  duloxetine. Drug: [name]  reports no history of drug use. Alcohol:  reports that he does not currently use alcohol after a past usage of about 12.0 standard drinks of alcohol per week. Tobacco:  reports that he has been smoking cigarettes. He has a 17 pack-year smoking history. He has been exposed to tobacco smoke. He has never used smokeless tobacco. Social: [name]  reports that he has been smoking cigarettes. He has a 17 pack-year smoking history. He has been exposed to tobacco smoke. He has never used smokeless tobacco. He reports that he does not currently use alcohol after a past usage of about 12.0 standard drinks of alcohol per week. He reports that he does not use drugs. Medical:  has a past medical history of Allergy, Calculus of kidney (02/04/2015), COPD (chronic obstructive pulmonary disease) (HCC), Diabetes mellitus without complication (HCC), Hypertension, and Neuropathy. Surgical: [name]  has a past surgical history that includes Appendectomy; Incision and drainage perirectal abscess (N/A, 02/04/2015); Rectal exam under anesthesia (02/04/2015); ORIF ankle fracture (Left, 03/23/2017); Syndesmosis repair (Left, 03/23/2017); Colonoscopy with propofol (N/A, 12/31/2021); Esophagogastroduodenoscopy (N/A, 12/31/2021); Kidney stone surgery (Right); lung mass removal (N/A); Xi robotic assisted paraesophageal hernia repair (N/A, 01/20/2022); Insertion of mesh (01/20/2022); and Umbilical hernia repair (01/20/2022). Family: family history includes Alcohol abuse in his father; Cancer in his father; Cancer (age of onset: 10) in his brother; Cancer (age of onset: 94) in his mother; Diabetes in his brother; Heart disease in his  brother and father.  Laboratory Chemistry Profile   Renal Lab Results  Component Value Date   BUN 16 05/22/2022   CREATININE 1.22 05/22/2022   BCR 13 05/22/2022   GFRAA 59 (L) 12/07/2019   GFRNONAA >60 02/15/2022    Hepatic Lab Results  Component Value Date   AST  17 05/22/2022   ALT 21 05/22/2022   ALBUMIN 4.5 05/22/2022   ALKPHOS 84 05/22/2022   AMYLASE 93 02/04/2022   LIPASE 115 (H) 02/04/2022    Electrolytes Lab Results  Component Value Date   NA 137 05/22/2022   K 4.3 05/22/2022   CL 99 05/22/2022   CALCIUM 9.9 05/22/2022   MG 1.8 01/25/2022   PHOS 2.3 (L) 01/23/2022    Bone Lab Results  Component Value Date   25OHVITD1 44 09/08/2021   25OHVITD2 <1.0 09/08/2021   25OHVITD3 43 09/08/2021   TESTOSTERONE 406 10/12/2022    Inflammation (CRP: Acute Phase) (ESR: Chronic Phase) Lab Results  Component Value Date   CRP 1.8 (H) 09/08/2021   ESRSEDRATE 17 09/08/2021   LATICACIDVEN 1.6 02/03/2022         Note: Above Lab results reviewed.  Recent Imaging Review  MR BRAIN WO CONTRAST CLINICAL DATA:  Provided history: Weakness on left side of face.  EXAM: MRI HEAD WITHOUT CONTRAST  TECHNIQUE: Multiplanar, multiecho pulse sequences of the brain and surrounding structures were obtained without intravenous contrast.  COMPARISON:  Head CT 12/10/2016.  FINDINGS: Brain:  No age advanced or lobar predominant parenchymal atrophy.  There are a few small nonspecific T2 FLAIR hyperintense chronic insults scattered within the bilateral cerebral white matter.  No cortical encephalomalacia is identified.  There is no acute infarct.  No evidence of an intracranial mass.  No chronic intracranial blood products.  No extra-axial fluid collection.  No midline shift.  Vascular: Maintained flow voids within the proximal large arterial vessels.  Skull and upper cervical spine: No focal suspicious marrow lesion.  Sinuses/Orbits: No mass or acute finding within the imaged orbits. Mild mucosal thickening within bilateral ethmoid air cells. Minimal mucosal thickening within the bilateral frontal and maxillary sinuses.  Other: Small-volume fluid within the left mastoid air cells.  IMPRESSION: 1. No evidence of an acute intracranial  abnormality. 2. There are a few small nonspecific T2 FLAIR hyperintense chronic insults within the cerebral white matter. Otherwise unremarkable MRI appearance of the brain. 3. Mild paranasal sinus mucosal thickening. 4. Small-volume fluid within the left mastoid air cells.  Electronically Signed   By: Jackey Loge D.O.   On: 11/17/2022 19:31 Note: Reviewed        Physical Exam  General appearance: Well nourished, well developed, and well hydrated. In no apparent acute distress Mental status: Alert, oriented x 3 (person, place, & time)       Respiratory: No evidence of acute respiratory distress Eyes: PERLA Vitals: There were no vitals taken for this visit. BMI: Estimated body mass index is 30.81 kg/m as calculated from the following:   Height as of 11/18/22: 6\' 2"  (1.88 m).   Weight as of 11/18/22: 240 lb (108.9 kg). Ideal: Patient weight not recorded  Assessment   Diagnosis Status  No diagnosis found. Controlled Controlled Controlled   Updated Problems: No problems updated.  Plan of Care  Problem-specific:  No problem-specific Assessment & Plan notes found for this encounter.  [name] has a current medication list which includes the following long-term medication(s): ferrous sulfate, fluticasone, hydrochlorothiazide, hydrocodone-acetaminophen, [START ON 12/23/2022] hydrocodone-acetaminophen, [  START ON 01/22/2023] hydrocodone-acetaminophen, humulin 70/30 kwikpen, metformin, montelukast, rosuvastatin, tadalafil, and testosterone cypionate.  Pharmacotherapy (Medications Ordered): No orders of the defined types were placed in this encounter.  Orders:  No orders of the defined types were placed in this encounter.  Follow-up plan:   No follow-ups on file.      Interventional Therapies  Risk Factors  Considerations:   WNL   Planned  Pending:   Therapeutic left L5-S1 LESI #3    Under consideration:   Therapeutic left RACZ procedure #1  Diagnostic  bilateral lumbar facet MBB #1  Diagnostic/therapeutic left L5 & S1 TFESI #1  Possible spinal cord stimulator trial  Therapeutic left L5-S1 percutaneous discectomy with "[employer] Dekompressor" system    Completed:   Diagnostic midline to left caudal ESI x1 (04/02/2022) (100/100/100/LBP:100/LEP:100)  Diagnostic left L5-S1 LESI x2 (05/26/2022) (1st:100/100/100/LBP:85  LEP:100) (2nd: 01[birth year]13/59/LBP:60LLEP:100) Therapeutic bilateral Qutenza neurolytic treatment x1 (12/23/2021)  (09/08/2021 & 10/29/2021) referral to physical therapy for evaluation and treatment of low back pain.   Completed by other providers:   EMG/PNCV of lower extremity (02/20/2020) by Dr. Cristopher Peru (generalized sensorimotor peripheral neuropathy; superimposed left S1 radiculopathy)   Therapeutic  Palliative (PRN) options:   None established       Recent Visits Date Type Provider Dept  11/18/22 Office Visit Delano Metz, MD [facility]  09/22/22 Office Visit Delano Metz, MD [facility]  Showing recent visits within past 90 days and meeting all other requirements Future Appointments Date Type Provider Dept  12/23/22 Appointment Delano Metz, MD [facility]  02/17/23 Appointment Delano Metz, MD [facility]  Showing future appointments within next 90 days and meeting all other requirements  I discussed the assessment and treatment plan with the patient. The patient was provided an opportunity to ask questions and all were answered. The patient agreed with the plan and demonstrated an understanding of the instructions.  Patient advised to call back or seek an in-person evaluation if the symptoms or condition worsens.  Duration of encounter: *** minutes.  Total time on encounter, as per AMA guidelines included both the face-to-face and non-face-to-face time personally spent by the physician and/or other qualified health care professional(s) on the day of the  encounter (includes time in activities that require the physician or other qualified health care professional and does not include time in activities normally performed by clinical staff). Physician's time may include the following activities when performed: Preparing to see the patient (e.g., pre-charting review of records, searching for previously ordered imaging, lab work, and nerve conduction tests) Review of prior analgesic pharmacotherapies. Reviewing PMP Interpreting ordered tests (e.g., lab work, imaging, nerve conduction tests) Performing post-procedure evaluations, including interpretation of diagnostic procedures Obtaining and/or reviewing separately obtained history Performing a medically appropriate examination and/or evaluation Counseling and educating the patient/family/caregiver Ordering medications, tests, or procedures Referring and communicating with other health care professionals (when not separately reported) Documenting clinical information in the electronic or other health record Independently interpreting results (not separately reported) and communicating results to the patient/ family/caregiver Care coordination (not separately reported)  Note by: Oswaldo Done, MD Date: 12/23/2022; Time: 1:04 PM

## 2022-12-23 ENCOUNTER — Encounter: Payer: Self-pay | Admitting: Pain Medicine

## 2022-12-23 ENCOUNTER — Ambulatory Visit: Payer: 59 | Attending: Pain Medicine | Admitting: Pain Medicine

## 2022-12-23 VITALS — BP 160/91 | HR 104 | Temp 99.9°F | Resp 16 | Ht 74.0 in | Wt 245.0 lb

## 2022-12-23 DIAGNOSIS — M5417 Radiculopathy, lumbosacral region: Secondary | ICD-10-CM

## 2022-12-23 DIAGNOSIS — Z87891 Personal history of nicotine dependence: Secondary | ICD-10-CM | POA: Diagnosis not present

## 2022-12-23 DIAGNOSIS — Z8249 Family history of ischemic heart disease and other diseases of the circulatory system: Secondary | ICD-10-CM | POA: Insufficient documentation

## 2022-12-23 DIAGNOSIS — M5117 Intervertebral disc disorders with radiculopathy, lumbosacral region: Secondary | ICD-10-CM | POA: Diagnosis not present

## 2022-12-23 DIAGNOSIS — R937 Abnormal findings on diagnostic imaging of other parts of musculoskeletal system: Secondary | ICD-10-CM

## 2022-12-23 DIAGNOSIS — Z833 Family history of diabetes mellitus: Secondary | ICD-10-CM | POA: Insufficient documentation

## 2022-12-23 DIAGNOSIS — J449 Chronic obstructive pulmonary disease, unspecified: Secondary | ICD-10-CM | POA: Insufficient documentation

## 2022-12-23 DIAGNOSIS — M4807 Spinal stenosis, lumbosacral region: Secondary | ICD-10-CM

## 2022-12-23 DIAGNOSIS — M533 Sacrococcygeal disorders, not elsewhere classified: Secondary | ICD-10-CM | POA: Diagnosis not present

## 2022-12-23 DIAGNOSIS — G8929 Other chronic pain: Secondary | ICD-10-CM | POA: Diagnosis present

## 2022-12-23 DIAGNOSIS — E1142 Type 2 diabetes mellitus with diabetic polyneuropathy: Secondary | ICD-10-CM | POA: Diagnosis not present

## 2022-12-23 DIAGNOSIS — I10 Essential (primary) hypertension: Secondary | ICD-10-CM | POA: Insufficient documentation

## 2022-12-23 DIAGNOSIS — M5441 Lumbago with sciatica, right side: Secondary | ICD-10-CM | POA: Diagnosis present

## 2022-12-23 DIAGNOSIS — M51372 Other intervertebral disc degeneration, lumbosacral region with discogenic back pain and lower extremity pain: Secondary | ICD-10-CM | POA: Diagnosis not present

## 2022-12-23 DIAGNOSIS — R94130 Abnormal response to nerve stimulation, unspecified: Secondary | ICD-10-CM

## 2022-12-23 NOTE — Progress Notes (Signed)
Safety precautions to be maintained throughout the outpatient stay will include: orient to surroundings, keep bed in low position, maintain call bell within reach at all times, provide assistance with transfer out of bed and ambulation.  

## 2022-12-23 NOTE — Patient Instructions (Signed)

## 2022-12-24 ENCOUNTER — Ambulatory Visit: Payer: 59 | Attending: Pain Medicine | Admitting: Pain Medicine

## 2022-12-24 ENCOUNTER — Encounter: Payer: Self-pay | Admitting: Pain Medicine

## 2022-12-24 ENCOUNTER — Ambulatory Visit
Admission: RE | Admit: 2022-12-24 | Discharge: 2022-12-24 | Disposition: A | Discharge status: Home / Self Care | Payer: 59 | Source: Ambulatory Visit | Attending: Pain Medicine | Admitting: Pain Medicine

## 2022-12-24 VITALS — BP 169/110 | HR 94 | Temp 99.0°F | Resp 16 | Ht 74.0 in | Wt 245.0 lb

## 2022-12-24 DIAGNOSIS — G8929 Other chronic pain: Secondary | ICD-10-CM | POA: Diagnosis present

## 2022-12-24 DIAGNOSIS — M5416 Radiculopathy, lumbar region: Secondary | ICD-10-CM

## 2022-12-24 DIAGNOSIS — M5441 Lumbago with sciatica, right side: Secondary | ICD-10-CM | POA: Insufficient documentation

## 2022-12-24 DIAGNOSIS — L0232 Furuncle of buttock: Secondary | ICD-10-CM

## 2022-12-24 DIAGNOSIS — M51372 Other intervertebral disc degeneration, lumbosacral region with discogenic back pain and lower extremity pain: Secondary | ICD-10-CM

## 2022-12-24 DIAGNOSIS — M79604 Pain in right leg: Secondary | ICD-10-CM | POA: Insufficient documentation

## 2022-12-24 DIAGNOSIS — E1142 Type 2 diabetes mellitus with diabetic polyneuropathy: Secondary | ICD-10-CM | POA: Diagnosis present

## 2022-12-24 DIAGNOSIS — R94130 Abnormal response to nerve stimulation, unspecified: Secondary | ICD-10-CM

## 2022-12-24 DIAGNOSIS — R937 Abnormal findings on diagnostic imaging of other parts of musculoskeletal system: Secondary | ICD-10-CM

## 2022-12-24 MED ORDER — PENTAFLUOROPROP-TETRAFLUOROETH EX AERO
INHALATION_SPRAY | Freq: Once | CUTANEOUS | Status: AC
  Administered 2022-12-24: 30 via TOPICAL
  Filled 2022-12-24: qty 30

## 2022-12-24 MED ORDER — LIDOCAINE HCL 2 % IJ SOLN
20.0000 mL | Freq: Once | INTRAMUSCULAR | Status: AC
  Administered 2022-12-24: 400 mg
  Filled 2022-12-24: qty 20

## 2022-12-24 MED ORDER — SODIUM CHLORIDE (PF) 0.9 % IJ SOLN
INTRAMUSCULAR | Status: AC
  Filled 2022-12-24: qty 10

## 2022-12-24 MED ORDER — AMOXICILLIN-POT CLAVULANATE 875-125 MG PO TABS: 1.0000 | ORAL_TABLET | Freq: Two times a day (BID) | ORAL | 0 refills | Status: DC

## 2022-12-24 MED ORDER — SODIUM CHLORIDE 0.9% FLUSH
2.0000 mL | Freq: Once | INTRAVENOUS | Status: AC
  Administered 2022-12-24: 2 mL

## 2022-12-24 MED ORDER — ROPIVACAINE HCL 2 MG/ML IJ SOLN
2.0000 mL | Freq: Once | INTRAMUSCULAR | Status: AC
  Administered 2022-12-24: 2 mL via EPIDURAL
  Filled 2022-12-24: qty 20

## 2022-12-24 MED ORDER — TRIAMCINOLONE ACETONIDE 40 MG/ML IJ SUSP
40.0000 mg | Freq: Once | INTRAMUSCULAR | Status: AC
  Administered 2022-12-24: 40 mg
  Filled 2022-12-24: qty 1

## 2022-12-24 MED ORDER — IOHEXOL 180 MG/ML  SOLN
10.0000 mL | Freq: Once | INTRAMUSCULAR | Status: AC
  Administered 2022-12-24: 10 mL via EPIDURAL
  Filled 2022-12-24: qty 20

## 2022-12-24 NOTE — Progress Notes (Signed)
PROVIDER NOTE: Interpretation of information contained herein should be left to medically-trained personnel. Specific patient instructions are provided elsewhere under "Patient Instructions" section of medical record. This document was created in part using STT-dictation technology, any transcriptional errors that may result from this process are unintentional.  Patient: Darren Allen Type: Established DOB: 01-29-1966 MRN: [id number] PCP: Larae Grooms, NP  Service: Procedure DOS: 12/24/2022 Setting: Ambulatory Location: Ambulatory outpatient facility Delivery: Face-to-face Provider: Oswaldo Done, MD Specialty: Interventional Pain Management Specialty designation: 09 Location: Outpatient facility Ref. Prov.: Larae Grooms, NP       Interventional Therapy   Procedure: Lumbar epidural steroid injection (LESI) (interlaminar) #1    Laterality: Right   Level:  L5-S1 Level.  Imaging: Fluoroscopic guidance         Anesthesia: Local anesthesia (1-2% Lidocaine) Anxiolysis: None                 Sedation: No Sedation                       DOS: 12/24/2022  Performed by: Oswaldo Done, MD  Purpose: Diagnostic/Therapeutic Indications: Lumbar radicular pain of intraspinal etiology of more than 4 weeks that has failed to respond to conservative therapy and is severe enough to impact quality of life or function. 1. 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 Lumbar radiculitis (Right)   3. Chronic lower extremity pain (intermittent) (Right)   4. Degeneration of intervertebral disc of lumbosacral region with discogenic back pain and lower extremity pain   5. Diabetic peripheral neuropathy (HCC)    NAS-11 Pain score:   Pre-procedure: 3 /10   Post-procedure: 1 /10      Position / Prep / Materials:  Position: Prone w/ head of the table raised (slight reverse trendelenburg) to facilitate breathing.  Prep solution: DuraPrep (Iodine Povacrylex [0.7% available  iodine] and Isopropyl Alcohol, 74% w/w) Prep Area: Entire Posterior Lumbar Region from lower scapular tip down to mid buttocks area and from flank to flank. Materials:  Tray: Epidural tray Needle(s):  Type: Epidural needle (Tuohy) Gauge (G):  17 Length: Regular (3.5-in) Qty: 1   H&P (Pre-op Assessment):  Darren Allen is a 57 y.o. (year old), male patient, seen today for interventional treatment. He  has a past surgical history that includes Appendectomy; Incision and drainage perirectal abscess (N/A, 02/04/2015); Rectal exam under anesthesia (02/04/2015); ORIF ankle fracture (Left, 03/23/2017); Syndesmosis repair (Left, 03/23/2017); Colonoscopy with propofol (N/A, 12/31/2021); Esophagogastroduodenoscopy (N/A, 12/31/2021); Kidney stone surgery (Right); lung mass removal (N/A); Xi robotic assisted paraesophageal hernia repair (N/A, 01/20/2022); Insertion of mesh (01/20/2022); and Umbilical hernia repair (01/20/2022). Darren Allen has a current medication list which includes the following prescription(s): acetaminophen, amitriptyline, amoxicillin-clavulanate, ascorbic acid, benzoyl peroxide, breztri aerosphere, epinephrine, ferrous sulfate, fluticasone, gabapentin, hydrochlorothiazide, hydrocodone-acetaminophen, [START ON 01/22/2023] hydrocodone-acetaminophen, humulin 70/30 kwikpen, ipratropium-albuterol, magnesium oxide -mg supplement, metformin, montelukast, naloxone, bd safetyglide needle, bd safetyglide needle, ondansetron, proair hfa, rosuvastatin, semaglutide (2 mg/dose), tadalafil, tamsulosin, testosterone cypionate, tretinoin, triamcinolone cream, and hydrocodone-acetaminophen. His primarily concern today is the Back Pain (low)  Initial Vital Signs:  Pulse/HCG Rate: (!) 9  Temp: 99 F (37.2 C) Resp: 18 BP: (!) 150/89 SpO2: 98 %  BMI: Estimated body mass index is 31.46 kg/m as calculated from the following:   Height as of this encounter: 6\' 2"  (1.88 m).   Weight as of this encounter: 245 lb  (111.1 kg).  Risk Assessment: Allergies: Reviewed. He is allergic to glipizide,  cephalexin, duloxetine, and dupilumab.  Allergy Precautions: None required Coagulopathies: Reviewed. None identified.  Blood-thinner therapy: None at this time Active Infection(s): Reviewed. None identified. Darren Allen is afebrile  Site Confirmation: Darren Allen was asked to confirm the procedure and laterality before marking the site Procedure checklist: Completed Consent: Before the procedure and under the influence of no sedative(s), amnesic(s), or anxiolytics, the patient was informed of the treatment options, risks and possible complications. To fulfill our ethical and legal obligations, as recommended by the American Medical Association's Code of Ethics, I have informed the patient of my clinical impression; the nature and purpose of the treatment or procedure; the risks, benefits, and possible complications of the intervention; the alternatives, including doing nothing; the risk(s) and benefit(s) of the alternative treatment(s) or procedure(s); and the risk(s) and benefit(s) of doing nothing. The patient was provided information about the general risks and possible complications associated with the procedure. These may include, but are not limited to: failure to achieve desired goals, infection, bleeding, organ or nerve damage, allergic reactions, paralysis, and death. In addition, the patient was informed of those risks and complications associated to Spine-related procedures, such as failure to decrease pain; infection (i.e.: Meningitis, epidural or intraspinal abscess); bleeding (i.e.: epidural hematoma, subarachnoid hemorrhage, or any other type of intraspinal or peri-dural bleeding); organ or nerve damage (i.e.: Any type of peripheral nerve, nerve root, or spinal cord injury) with subsequent damage to sensory, motor, and/or autonomic systems, resulting in permanent pain, numbness, and/or weakness of one or several  areas of the body; allergic reactions; (i.e.: anaphylactic reaction); and/or death. Furthermore, the patient was informed of those risks and complications associated with the medications. These include, but are not limited to: allergic reactions (i.e.: anaphylactic or anaphylactoid reaction(s)); adrenal axis suppression; blood sugar elevation that in diabetics may result in ketoacidosis or comma; water retention that in patients with history of congestive heart failure may result in shortness of breath, pulmonary edema, and decompensation with resultant heart failure; weight gain; swelling or edema; medication-induced neural toxicity; particulate matter embolism and blood vessel occlusion with resultant organ, and/or nervous system infarction; and/or aseptic necrosis of one or more joints. Finally, the patient was informed that Medicine is not an exact science; therefore, there is also the possibility of unforeseen or unpredictable risks and/or possible complications that may result in a catastrophic outcome. The patient indicated having understood very clearly. We have given the patient no guarantees and we have made no promises. Enough time was given to the patient to ask questions, all of which were answered to the patient's satisfaction. Darren Allen has indicated that he wanted to continue with the procedure. Attestation: I, the ordering provider, attest that I have discussed with the patient the benefits, risks, side-effects, alternatives, likelihood of achieving goals, and potential problems during recovery for the procedure that I have provided informed consent. Date  Time: 12/24/2022 11:03 AM   Pre-Procedure Preparation:  Monitoring: As per clinic protocol. Respiration, ETCO2, SpO2, BP, heart rate and rhythm monitor placed and checked for adequate function Safety Precautions: Patient was assessed for positional comfort and pressure points before starting the procedure. Time-out: I initiated and  conducted the "Time-out" before starting the procedure, as per protocol. The patient was asked to participate by confirming the accuracy of the "Time Out" information. Verification of the correct person, site, and procedure were performed and confirmed by me, the nursing staff, and the patient. "Time-out" conducted as per Joint Commission's Universal Protocol (UP.01.01.01). Time: 1140 Start Time:  1140 hrs.  Description/Narrative of Procedure:          Target: Epidural space via interlaminar opening, initially targeting the lower laminar border of the superior vertebral body. Region: Lumbar Approach: Percutaneous paravertebral  Rationale (medical necessity): procedure needed and proper for the diagnosis and/or treatment of the patient's medical symptoms and needs. Procedural Technique Safety Precautions: Aspiration looking for blood return was conducted prior to all injections. At no point did we inject any substances, as a needle was being advanced. No attempts were made at seeking any paresthesias. Safe injection practices and needle disposal techniques used. Medications properly checked for expiration dates. SDV (single dose vial) medications used. Description of the Procedure: Protocol guidelines were followed. The procedure needle was introduced through the skin, ipsilateral to the reported pain, and advanced to the target area. Bone was contacted and the needle walked caudad, until the lamina was cleared. The epidural space was identified using "loss-of-resistance technique" with 2-3 ml of PF-NaCl (0.9% NSS), in a 5cc LOR glass syringe.  Vitals:   12/24/22 1102 12/24/22 1140 12/24/22 1149  BP: (!) 150/89 (!) 168/114 (!) 169/110  Pulse: (!) 9 92 94  Resp: 18 15 16   Temp: 99 F (37.2 C)    SpO2: 98% 99% 98%  Weight: 245 lb (111.1 kg)    Height: 6\' 2"  (1.88 m)      Start Time: 1140 hrs. End Time: 1146 hrs.  Imaging Guidance (Spinal):          Type of Imaging Technique: Fluoroscopy  Guidance (Spinal) Indication(s): Assistance in needle guidance and placement for procedures requiring needle placement in or near specific anatomical locations not easily accessible without such assistance. Exposure Time: Please see nurses notes. Contrast: Before injecting any contrast, we confirmed that the patient did not have an allergy to iodine, shellfish, or radiological contrast. Once satisfactory needle placement was completed at the desired level, radiological contrast was injected. Contrast injected under live fluoroscopy. No contrast complications. See chart for type and volume of contrast used. Fluoroscopic Guidance: I was personally present during the use of fluoroscopy. "Tunnel Vision Technique" used to obtain the best possible view of the target area. Parallax error corrected before commencing the procedure. "Direction-depth-direction" technique used to introduce the needle under continuous pulsed fluoroscopy. Once target was reached, antero-posterior, oblique, and lateral fluoroscopic projection used confirm needle placement in all planes. Images permanently stored in EMR. Interpretation: I personally interpreted the imaging intraoperatively. Adequate needle placement confirmed in multiple planes. Appropriate spread of contrast into desired area was observed. No evidence of afferent or efferent intravascular uptake. No intrathecal or subarachnoid spread observed. Permanent images saved into the patient's record.  Antibiotic Prophylaxis:   Anti-infectives (From admission, onward)    Start     Dose/Rate Route Frequency Ordered Stop   12/24/22 0000  amoxicillin-clavulanate (AUGMENTIN) 875-125 MG tablet        1 tablet Oral 2 times daily 12/24/22 1159 01/07/23 2359      Indication(s): None identified  Post-operative Assessment:  Post-procedure Vital Signs:  Pulse/HCG Rate: 94  Temp: 99 F (37.2 C) Resp: 16 BP: (!) 169/110 SpO2: 98 %  EBL: None  Complications: No immediate  post-treatment complications observed by team, or reported by patient.  Note: The patient tolerated the entire procedure well. A repeat set of vitals were taken after the procedure and the patient was kept under observation following institutional policy, for this type of procedure. Post-procedural neurological assessment was performed, showing return to baseline, prior to  discharge. The patient was provided with post-procedure discharge instructions, including a section on how to identify potential problems. Should any problems arise concerning this procedure, the patient was given instructions to immediately contact us, at any time, without hesitation. In any case, we plan to contact the patient by telephone for a follow-up status report regarding this interventional procedure.  Comments:  No additional relevant information.  Plan of Care (POC)  Orders:  Orders Placed This Encounter  Procedures   Lumbar Epidural Injection    Scheduling Instructions:     Procedure: Interlaminar LESI L5-S1     Laterality: Right     Sedation: Patient's choice     Timeframe: Today    Order Specific Question:   Where will this procedure be performed?    Answer:   [facility]   DG [facility] C-ARM 1-60 MIN NO REPORT    Intraoperative interpretation by procedural physician at [facility].    Standing Status:   Standing    Number of Occurrences:   1    Order Specific Question:   Reason for exam:    Answer:   Assistance in needle guidance and placement for procedures requiring needle placement in or near specific anatomical locations not easily accessible without such assistance.   Informed Consent Details: Physician/Practitioner Attestation; Transcribe to consent form and obtain patient signature    Note: Always confirm laterality of pain with Darren Allen, before procedure. Transcribe to consent form and obtain patient signature.    Order Specific Question:   Physician/Practitioner  attestation of informed consent for procedure/surgical case    Answer:   I, the physician/practitioner, attest that I have discussed with the patient the benefits, risks, side effects, alternatives, likelihood of achieving goals and potential problems during recovery for the procedure that I have provided informed consent.    Order Specific Question:   Procedure    Answer:   Lumbar epidural steroid injection under fluoroscopic guidance    Order Specific Question:   Physician/Practitioner performing the procedure    Answer:   Samit Sylve A. Laban Emperor, MD    Order Specific Question:   Indication/Reason    Answer:   Low back and/or lower extremity pain secondary to lumbar radiculitis   Provide equipment / supplies at bedside    Procedural tray: Epidural Tray (Disposable  single use) Skin infiltration needle: Regular 1.5-in, 25-G, (x1) Block needle size: Regular standard Catheter: No catheter required    Standing Status:   Standing    Number of Occurrences:   1    Order Specific Question:   Specify    Answer:   Epidural Tray   Chronic Opioid Analgesic:  Hydrocodone/APAP 5/325 tablet, 1 tab p.o. twice daily (#60) MME/day: 10 mg/day   Medications ordered for procedure: Meds ordered this encounter  Medications   iohexol (OMNIPAQUE) 180 MG/ML injection 10 mL    Must be Myelogram-compatible. If not available, you may substitute with a water-soluble, non-ionic, hypoallergenic, myelogram-compatible radiological contrast medium.   lidocaine (XYLOCAINE) 2 % (with pres) injection 400 mg   pentafluoroprop-tetrafluoroeth (GEBAUERS) aerosol   sodium chloride flush (NS) 0.9 % injection 2 mL   ropivacaine (PF) 2 mg/mL (0.2%) (NAROPIN) injection 2 mL   triamcinolone acetonide (KENALOG-40) injection 40 mg   amoxicillin-clavulanate (AUGMENTIN) 875-125 MG tablet    Sig: Take 1 tablet by mouth 2 (two) times daily for 14 days.    Dispense:  28 tablet    Refill:  0   Medications administered: We administered  iohexol, lidocaine, pentafluoroprop-tetrafluoroeth, sodium chloride flush, ropivacaine (PF) 2 mg/mL (0.2%), and triamcinolone acetonide.  See the medical record for exact dosing, route, and time of administration.  Follow-up plan:   Return in about 2 weeks (around 01/07/2023) for (Face2F), (PPE).       Interventional Therapies  Risk Factors  Considerations:   WNL   Planned  Pending:   Therapeutic right L5-S1 LESI #1    Under consideration:   Therapeutic left L5-S1 LESI #3  Therapeutic left RACZ procedure #1  Diagnostic bilateral lumbar facet MBB #1  Diagnostic/therapeutic left L5 & S1 TFESI #1  Possible spinal cord stimulator trial  Therapeutic left L5-S1 percutaneous discectomy with "[employer] Dekompressor" system    Completed:   Diagnostic midline to left caudal ESI x1 (04/02/2022) (100/100/100/LBP:100/LEP:100)  Diagnostic left L5-S1 LESI x2 (05/26/2022) (1st:100/100/100/LBP:85  LEP:100) (2nd: [birth year]/LBP:60LLEP:100) Therapeutic bilateral Qutenza neurolytic treatment x1 (12/23/2021)  (09/08/2021 & 10/29/2021) referral to physical therapy for evaluation and treatment of low back pain.   Completed by other providers:   EMG/PNCV of lower extremity (02/20/2020) by Dr. Cristopher Peru (generalized sensorimotor peripheral neuropathy; superimposed left S1 radiculopathy)   Therapeutic  Palliative (PRN) options:   None established       Recent Visits Date Type Provider Dept  12/23/22 Office Visit Delano Metz, MD [facility]  11/18/22 Office Visit Delano Metz, MD [facility]  Showing recent visits within past 90 days and meeting all other requirements Today's Visits Date Type Provider Dept  12/24/22 Procedure visit Delano Metz, MD [facility]  Showing today's visits and meeting all other requirements Future Appointments Date Type Provider Dept  01/07/23 Appointment Delano Metz, MD [facility]  02/17/23 Appointment  Delano Metz, MD [facility]  Showing future appointments within next 90 days and meeting all other requirements  Disposition: Discharge home  Discharge (Date  Time): 12/24/2022; 1153 hrs.   Primary Care Physician: Larae Grooms, NP Location: [facility] Note by: Oswaldo Done, MD (TTS technology used. I apologize for any typographical errors that were not detected and corrected.) Date: 12/24/2022; Time: 12:00 PM  Disclaimer:  Medicine is not an Visual merchandiser. The only guarantee in medicine is that nothing is guaranteed. It is important to note that the decision to proceed with this intervention was based on the information collected from the patient. The Data and conclusions were drawn from the patient's questionnaire, the interview, and the physical examination. Because the information was provided in large part by the patient, it cannot be guaranteed that it has not been purposely or unconsciously manipulated. Every effort has been made to obtain as much relevant data as possible for this evaluation. It is important to note that the conclusions that lead to this procedure are derived in large part from the available data. Always take into account that the treatment will also be dependent on availability of resources and existing treatment guidelines, considered by other Pain Management Practitioners as being common knowledge and practice, at the time of the intervention. For Medico-Legal purposes, it is also important to point out that variation in procedural techniques and pharmacological choices are the acceptable norm. The indications, contraindications, technique, and results of the above procedure should only be interpreted and judged by a Board-Certified Interventional Pain Specialist with extensive familiarity and expertise in the same exact procedure and technique.

## 2022-12-24 NOTE — Patient Instructions (Signed)

## 2022-12-24 NOTE — Progress Notes (Signed)
Safety precautions to be maintained throughout the outpatient stay will include: orient to surroundings, keep bed in low position, maintain call bell within reach at all times, provide assistance with transfer out of bed and ambulation.  

## 2022-12-25 ENCOUNTER — Telehealth: Payer: Self-pay | Admitting: *Deleted

## 2022-12-25 NOTE — Telephone Encounter (Signed)
Post procedure call;  patient  reports that he is doing okay. No questions or concerns.

## 2023-01-07 ENCOUNTER — Ambulatory Visit: Payer: 59 | Attending: Pain Medicine | Admitting: Pain Medicine

## 2023-01-07 ENCOUNTER — Encounter: Payer: Self-pay | Admitting: Pain Medicine

## 2023-01-07 VITALS — BP 138/89 | HR 103 | Temp 99.1°F | Resp 17 | Ht 74.0 in | Wt 240.0 lb

## 2023-01-07 DIAGNOSIS — M5441 Lumbago with sciatica, right side: Secondary | ICD-10-CM | POA: Diagnosis not present

## 2023-01-07 DIAGNOSIS — M545 Low back pain, unspecified: Secondary | ICD-10-CM | POA: Insufficient documentation

## 2023-01-07 DIAGNOSIS — M5416 Radiculopathy, lumbar region: Secondary | ICD-10-CM | POA: Insufficient documentation

## 2023-01-07 DIAGNOSIS — G8929 Other chronic pain: Secondary | ICD-10-CM | POA: Insufficient documentation

## 2023-01-07 DIAGNOSIS — M79604 Pain in right leg: Secondary | ICD-10-CM | POA: Diagnosis present

## 2023-01-07 DIAGNOSIS — Z09 Encounter for follow-up examination after completed treatment for conditions other than malignant neoplasm: Secondary | ICD-10-CM | POA: Diagnosis present

## 2023-01-07 NOTE — Progress Notes (Signed)
PROVIDER NOTE: Information contained herein reflects review and annotations entered in association with encounter. Interpretation of such information and data should be left to medically-trained personnel. Information provided to patient can be located elsewhere in the medical record under "Patient Instructions". Document created using STT-dictation technology, any transcriptional errors that may result from process are unintentional.    Patient: Darren Allen  Service Category: E/M  Provider: Oswaldo Done, MD  DOB: 1965-09-18  DOS: 01/07/2023  Referring Provider: Larae Grooms, NP  MRN: [id number]  Specialty: Interventional Pain Management  PCP: Larae Grooms, NP  Type: Established Patient  Setting: Ambulatory outpatient    Location: Office  Delivery: Face-to-face     HPI  Darren Allen, a 57 y.o. year old male, is here today because of his Chronic bilateral low back pain with right-sided sciatica [M54.41, G89.29]. Darren Allen today is Back Pain and Peripheral Neuropathy (Feet/ankles bilat)  Pertinent problems: Mr. Vonwald has Ankle fracture; Neuropathy; Diabetic peripheral neuropathy (HCC); Gout; Chronic ankle pain (Bilateral); Chronic low back pain (1ry area of Pain) (Bilateral) (R>L) w/o sciatica; Other acquired hammer toe; Chronic pain syndrome; Lumbar facet syndrome; Lumbosacral radiculopathy at S1 (Left); Chronic feet pain (2ry area of Pain) (Bilateral); Chronic ankle pain (Left); Abnormal NCS (nerve conduction studies) (02/20/2020); Abnormal MRI, lumbar spine (03/11/2020); DDD (degenerative disc disease), lumbosacral; Lumbosacral lateral recess stenosis (Left: L5-S1); Chronic low back pain (Bilateral) w/ sciatica (Left); Abnormal MRI, thoracic spine (07/05/2020); Chronic low back pain (Bilateral) w/ sciatica (Right); Chronic lower extremity pain (intermittent) (Right); Chronic lower extremity pain (intermittent) (Left); Lumbar radiculitis (Right); and Lumbar  radiculitis (Left) on their pertinent problem list. Pain Assessment: Severity of Chronic pain is reported as a 0-No pain/10. Location: Back Lower/denies. Onset:  . Quality:  . Timing:  . Modifying factor(s):  Marland Kitchen Vitals:  height is 6\' 2"  (1.88 m) and weight is 240 lb (108.9 kg). His temporal temperature is 99.1 F (37.3 C). His blood pressure is 138/89 and his pulse is 103 (abnormal). His respiration is 17 and oxygen saturation is 97%.  BMI: Estimated body mass index is 30.81 kg/m as calculated from the following:   Height as of this encounter: 6\' 2"  (1.88 m).   Weight as of this encounter: 240 lb (108.9 kg). Last encounter: 12/23/2022. Last procedure: 12/24/2022.  Reason for encounter: post-procedure evaluation and assessment. The patient indicates having attained an ongoing 100% relief of the low back pain.  The only pain that he refers remains is that of his peripheral neuropathy in the area of his feet.  He has tried the Qutenza at least twice and he refers not having attained any significant benefit from that.  Currently he is doing rather well and indicates not really needing anything else at this point.  However, we did talk about a possible plan of action should the pain start to come back.  He was encouraged to give me a call to see if it is the same pain and perhaps we could just treated in a similar manner.  I also took the opportunity to mention some other alternatives such as the possibility of a neurostimulator.  I have provided the patient with written information on the Nevro device since they claim to be very effective in the treatment of peripheral neuropathies.  Post-procedure evaluation   Procedure: Lumbar epidural steroid injection (LESI) (interlaminar) #1    Laterality: Right   Level:  L5-S1 Level.  Imaging: Fluoroscopic guidance  Anesthesia: Local anesthesia (1-2% Lidocaine) Anxiolysis: None                 Sedation: No Sedation                       DOS: 12/24/2022   Performed by: Oswaldo Done, MD  Purpose: Diagnostic/Therapeutic Indications: Lumbar radicular pain of intraspinal etiology of more than 4 weeks that has failed to respond to conservative therapy and is severe enough to impact quality of life or function. 1. 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 Lumbar radiculitis (Right)   3. Chronic lower extremity pain (intermittent) (Right)   4. Degeneration of intervertebral disc of lumbosacral region with discogenic back pain and lower extremity pain   5. Diabetic peripheral neuropathy (HCC)    NAS-11 Pain score:   Pre-procedure: 3 /10   Post-procedure: 1 /10      Effectiveness:  Initial hour after procedure: 100 %. Subsequent 4-6 hours post-procedure: 85 %. Analgesia past initial 6 hours: 100 %. Ongoing improvement:  Analgesic: The patient indicates having attained an ongoing 100% relief of the low back pain.  The only pain that he refers remains is that of his peripheral neuropathy in the area of his feet.  He has tried the Qutenza at least twice and he refers not having attained any significant benefit from that. Function: Darren Allen improvement in function ROM: Darren Allen improvement in ROM  Pharmacotherapy Assessment  Analgesic: Hydrocodone/APAP 5/325 tablet, 1 tab p.o. twice daily (#60) MME/day: 10 mg/day   Monitoring: [place] PMP: PDMP reviewed during this encounter.       Pharmacotherapy: No side-effects or adverse reactions reported. Compliance: No problems identified. Effectiveness: Clinically acceptable.  Darren Mattes, RN  01/07/2023  1:50 PM  Sign when Signing Visit Safety precautions to be maintained throughout the outpatient stay will include: orient to surroundings, keep bed in low position, maintain call bell within reach at all times, provide assistance with transfer out of bed and ambulation.     No results found for: "CBDTHCR" No results found for: "D8THCCBX" No results found  for: "D9THCCBX"  UDS:  Summary  Date Value Ref Range Status  08/24/2022 Note  Final    Comment:    ==================================================================== [employer] Select 13 (MW) ==================================================================== Test                             Result       Flag       Units  Drug Present and Declared for Prescription Verification   Hydrocodone                    312          EXPECTED   ng/mg creat   Norhydrocodone                 398          EXPECTED   ng/mg creat    Sources of hydrocodone include scheduled prescription medications.    Norhydrocodone is an expected metabolite of hydrocodone.  ==================================================================== Test                      Result    Flag   Units      Ref Range   Creatinine              161  mg/dL      >=28 ==================================================================== Declared Medications:  The flagging and interpretation on this report are based on the  following declared medications.  Unexpected results may arise from  inaccuracies in the declared medications.   **Note: The testing scope of this panel includes these medications:   Hydrocodone (Norco)   **Note: The testing scope of this panel does not include the  following reported medications:   Acetaminophen (Tylenol)  Acetaminophen (Norco)  Albuterol (Proair HFA)  Albuterol (Duoneb)  Amitriptyline (Elavil)  Epinephrine (EpiPen)  Fluticasone (Trelegy)  Fluticasone (Flonase)  Gabapentin (Neurontin)  Hydrochlorothiazide (Hydrodiuril)  Insulin (Humulin)  Ipratropium (Duoneb)  Iron  Lisinopril (Zestril)  Magnesium (Mag-Ox)  Meloxicam (Mobic)  Metformin (Glucophage)  Montelukast (Singulair)  Naloxone (Narcan)  Ondansetron (Zofran)  Prednisone (Deltasone)  Rosuvastatin (Crestor)  Semaglutide  Sulfamethoxazole (Bactrim)  Tadalafil (Cialis)  Tamsulosin (Flomax)  Testosterone   Triamcinolone (Kenalog)  Trimethoprim (Bactrim)  Umeclidinium (Trelegy)  Vilanterol (Trelegy)  Vitamin C ==================================================================== For clinical consultation, please call [phone]. ====================================================================       ROS  Constitutional: Denies any fever or chills Gastrointestinal: No reported hemesis, hematochezia, vomiting, or acute GI distress Musculoskeletal: Denies any acute onset joint swelling, redness, loss of ROM, or weakness Neurological: No reported episodes of acute onset apraxia, aphasia, dysarthria, agnosia, amnesia, paralysis, loss of coordination, or loss of consciousness  Medication Review  Budeson-Glycopyrrol-Formoterol, EPINEPHrine, HYDROcodone-acetaminophen, Magnesium Oxide -Mg Supplement, NEEDLE (DISP) 18 G, NEEDLE (DISP) 21 G, Semaglutide (2 MG/DOSE), acetaminophen, albuterol, amitriptyline, amoxicillin-clavulanate, ascorbic acid, benzoyl peroxide, ferrous sulfate, fluticasone, gabapentin, hydrochlorothiazide, insulin isophane & regular human KwikPen, ipratropium-albuterol, metFORMIN, montelukast, naloxone, ondansetron, rosuvastatin, tadalafil, tamsulosin, testosterone cypionate, tretinoin, and triamcinolone cream  History Review  Allergy: Darren Allen is allergic to glipizide, cephalexin, duloxetine, and dupilumab. Drug: Darren Allen  Allen no history of drug use. Alcohol:  Allen that he does not currently use alcohol after a past usage of about 12.0 standard drinks of alcohol per week. Tobacco:  Allen that he has quit smoking. His smoking use included cigarettes. He has a 17 pack-year smoking history. He has been exposed to tobacco smoke. He has never used smokeless tobacco. Social: Darren Allen  Allen that he has quit smoking. His smoking use included cigarettes. He has a 17 pack-year smoking history. He has been exposed to tobacco smoke. He has never used smokeless tobacco. He  Allen that he does not currently use alcohol after a past usage of about 12.0 standard drinks of alcohol per week. He Allen that he does not use drugs. Medical:  has a past medical history of Allergy, Calculus of kidney (02/04/2015), COPD (chronic obstructive pulmonary disease) (HCC), Diabetes mellitus without complication (HCC), Hypertension, and Neuropathy. Surgical: Darren Allen  has a past surgical history that includes Appendectomy; Incision and drainage perirectal abscess (N/A, 02/04/2015); Rectal exam under anesthesia (02/04/2015); ORIF ankle fracture (Left, 03/23/2017); Syndesmosis repair (Left, 03/23/2017); Colonoscopy with propofol (N/A, 12/31/2021); Esophagogastroduodenoscopy (N/A, 12/31/2021); Kidney stone surgery (Right); lung mass removal (N/A); Xi robotic assisted paraesophageal hernia repair (N/A, 01/20/2022); Insertion of mesh (01/20/2022); and Umbilical hernia repair (01/20/2022). Family: family history includes Alcohol abuse in his father; Cancer in his father; Cancer (age of onset: 66) in his brother; Cancer (age of onset: 81) in his mother; Diabetes in his brother; Heart disease in his brother and father.  Laboratory Chemistry Profile   Renal Lab Results  Component Value Date   BUN 16 05/22/2022   CREATININE 1.22 05/22/2022   BCR 13 05/22/2022   GFRAA 59 (  L) 12/07/2019   GFRNONAA >60 02/15/2022    Hepatic Lab Results  Component Value Date   AST 17 05/22/2022   ALT 21 05/22/2022   ALBUMIN 4.5 05/22/2022   ALKPHOS 84 05/22/2022   AMYLASE 93 02/04/2022   LIPASE 115 (H) 02/04/2022    Electrolytes Lab Results  Component Value Date   NA 137 05/22/2022   K 4.3 05/22/2022   CL 99 05/22/2022   CALCIUM 9.9 05/22/2022   MG 1.8 01/25/2022   PHOS 2.3 (L) 01/23/2022    Bone Lab Results  Component Value Date   25OHVITD1 44 09/08/2021   25OHVITD2 <1.0 09/08/2021   25OHVITD3 43 09/08/2021   TESTOSTERONE 406 10/12/2022    Inflammation (CRP: Acute Phase) (ESR: Chronic  Phase) Lab Results  Component Value Date   CRP 1.8 (H) 09/08/2021   ESRSEDRATE 17 09/08/2021   LATICACIDVEN 1.6 02/03/2022         Note: Above Lab results reviewed.  Recent Imaging Review  DG PAIN CLINIC C-ARM 1-60 MIN NO REPORT Fluoro was used, but no Radiologist interpretation will be provided.  Please refer to "NOTES" tab for provider progress note. Note: Reviewed        Physical Exam  General appearance: Well nourished, well developed, and well hydrated. In no apparent acute distress Mental status: Alert, oriented x 3 (person, place, & time)       Respiratory: No evidence of acute respiratory distress Eyes: PERLA Vitals: BP 138/89 (BP Location: Right Arm, Patient Position: Sitting, Cuff Size: Large)   Pulse (!) 103   Temp 99.1 F (37.3 C) (Temporal)   Resp 17   Ht 6\' 2"  (1.88 m)   Wt 240 lb (108.9 kg)   SpO2 97%   BMI 30.81 kg/m  BMI: Estimated body mass index is 30.81 kg/m as calculated from the following:   Height as of this encounter: 6\' 2"  (1.88 m).   Weight as of this encounter: 240 lb (108.9 kg). Ideal: Ideal body weight: 82.2 kg (181 lb 3.5 oz) Adjusted ideal body weight: 92.9 kg (204 lb 11.7 oz)  Assessment   Diagnosis Status  1. 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 Lumbar radiculitis (Right)   3. Chronic lower extremity pain (intermittent) (Right)   4. Chronic low back pain (1ry area of Pain) (Bilateral) (R>L) w/o sciatica   5. Postop check    Controlled Controlled Controlled   Updated Problems: No problems updated.  Plan of Care  Problem-specific:  No problem-specific Assessment & Plan notes found for this encounter.  Darren Allen has a current medication list which includes the following long-term medication(s): amoxicillin-clavulanate, ferrous sulfate, fluticasone, hydrochlorothiazide, hydrocodone-acetaminophen, hydrocodone-acetaminophen, [START ON 01/22/2023] hydrocodone-acetaminophen, humulin 70/30 kwikpen,  metformin, montelukast, rosuvastatin, tadalafil, and testosterone cypionate.  Pharmacotherapy (Medications Ordered): No orders of the defined types were placed in this encounter.  Orders:  Orders Placed This Encounter  Procedures   Nursing Instructions:    Please complete this patient's postprocedure evaluation.    Scheduling Instructions:     Please complete this patient's postprocedure evaluation.   Follow-up plan:   Return if symptoms worsen or fail to improve.      Interventional Therapies  Risk Factors  Considerations:   WNL   Planned  Pending:   Therapeutic right L5-S1 LESI #1    Under consideration:   Therapeutic left L5-S1 LESI #3  Therapeutic left RACZ procedure #1  Diagnostic bilateral lumbar facet MBB #1  Diagnostic/therapeutic left L5 &  S1 TFESI #1  Possible spinal cord stimulator trial  Therapeutic left L5-S1 percutaneous discectomy with "[employer] Dekompressor" system    Completed:   Diagnostic midline to left caudal ESI x1 (04/02/2022) (100/100/100/LBP:100/LEP:100)  Diagnostic left L5-S1 LESI x2 (05/26/2022) (1st:100/100/100/LBP:85  LEP:100) (2nd: 01[birth year]13/59/LBP:60LLEP:100) Therapeutic bilateral Qutenza neurolytic treatment x1 (12/23/2021)  (09/08/2021 & 10/29/2021) referral to physical therapy for evaluation and treatment of low back pain.   Completed by other providers:   EMG/PNCV of lower extremity (02/20/2020) by Dr. Cristopher Peru (generalized sensorimotor peripheral neuropathy; superimposed left S1 radiculopathy)   Therapeutic  Palliative (PRN) options:   None established        Recent Visits Date Type Provider Dept  12/24/22 Procedure visit Delano Metz, MD [facility]  12/23/22 Office Visit Delano Metz, MD [facility]  11/18/22 Office Visit Delano Metz, MD [facility]  Showing recent visits within past 90 days and meeting all other requirements Today's Visits Date Type Provider Dept  01/07/23  Office Visit Delano Metz, MD [facility]  Showing today's visits and meeting all other requirements Future Appointments Date Type Provider Dept  02/17/23 Appointment Delano Metz, MD [facility]  Showing future appointments within next 90 days and meeting all other requirements  I discussed the assessment and treatment plan with the patient. The patient was provided an opportunity to ask questions and all were answered. The patient agreed with the plan and demonstrated an understanding of the instructions.  Patient advised to call back or seek an in-person evaluation if the symptoms or condition worsens.  Duration of encounter: 30 minutes.  Total time on encounter, as per AMA guidelines included both the face-to-face and non-face-to-face time personally spent by the physician and/or other qualified health care professional(s) on the day of the encounter (includes time in activities that require the physician or other qualified health care professional and does not include time in activities normally performed by clinical staff). Physician's time may include the following activities when performed: Preparing to see the patient (e.g., pre-charting review of records, searching for previously ordered imaging, lab work, and nerve conduction tests) Review of prior analgesic pharmacotherapies. Reviewing PMP Interpreting ordered tests (e.g., lab work, imaging, nerve conduction tests) Performing post-procedure evaluations, including interpretation of diagnostic procedures Obtaining and/or reviewing separately obtained history Performing a medically appropriate examination and/or evaluation Counseling and educating the patient/family/caregiver Ordering medications, tests, or procedures Referring and communicating with other health care professionals (when not separately reported) Documenting clinical information in the electronic or other health record Independently  interpreting results (not separately reported) and communicating results to the patient/ family/caregiver Care coordination (not separately reported)  Note by: Oswaldo Done, MD Date: 01/07/2023; Time: 3:39 PM

## 2023-01-07 NOTE — Progress Notes (Signed)
Safety precautions to be maintained throughout the outpatient stay will include: orient to surroundings, keep bed in low position, maintain call bell within reach at all times, provide assistance with transfer out of bed and ambulation.  

## 2023-01-21 ENCOUNTER — Encounter: Payer: Self-pay | Admitting: Nurse Practitioner

## 2023-01-21 NOTE — Telephone Encounter (Signed)
Attempted to call patient, LVM to call office back to schedule an appointment to address concerns.  Put in CRM.

## 2023-01-21 NOTE — Progress Notes (Signed)
BP (!) 145/89 (BP Location: Right Arm, Patient Position: Sitting, Cuff Size: Large)   Pulse 97   Temp 98.4 F (36.9 C) (Oral)   Ht 6\' 2"  (1.88 m)   Wt 248 lb 3.2 oz (112.6 kg)   SpO2 96%   BMI 31.87 kg/m    Subjective:    Patient ID: Darren Allen, male    DOB: 1965/10/25, 57 y.o.   MRN: [id number]  HPI: Darren Allen is a 57 y.o. male  Chief Complaint  Patient presents with   boils    Amoxicillin was given for about 1 month, ineffective, located on the chest, one on the scalp, and left side cheek, thinks there maybe a possible bacteria infection, has been a problem for about 5 weeks, notice that they come back every few days    Patient states his boils started about 5 weeks ago.  Pain management treated him with Augmentin on 10/10.  Patient states the boils did clear some.  He had two on his chest that cleared up.  He is getting new ones. One on the right side of his scalp.  No fevers.  He hasn't changed any skin care products, lotions.  States he has been more tired lately.  His urine has been more strong.     Relevant past medical, surgical, family and social history reviewed and updated as indicated. Interim medical history since our last visit reviewed. Allergies and medications reviewed and updated.  Review of Systems  Skin:        boils    Per HPI unless specifically indicated above     Objective:    BP (!) 145/89 (BP Location: Right Arm, Patient Position: Sitting, Cuff Size: Large)   Pulse 97   Temp 98.4 F (36.9 C) (Oral)   Ht 6\' 2"  (1.88 m)   Wt 248 lb 3.2 oz (112.6 kg)   SpO2 96%   BMI 31.87 kg/m   Wt Readings from Last 3 Encounters:  01/22/23 248 lb 3.2 oz (112.6 kg)  01/07/23 240 lb (108.9 kg)  12/24/22 245 lb (111.1 kg)    Physical Exam Vitals and nursing note reviewed.  Constitutional:      General: He is not in acute distress.    Appearance: Normal appearance. He is not ill-appearing, toxic-appearing or diaphoretic.  HENT:     Head:  Normocephalic.     Right Ear: External ear normal.     Left Ear: External ear normal.     Nose: Nose normal. No congestion or rhinorrhea.     Mouth/Throat:     Mouth: Mucous membranes are moist.  Eyes:     General:        Right eye: No discharge.        Left eye: No discharge.     Extraocular Movements: Extraocular movements intact.     Conjunctiva/sclera: Conjunctivae normal.     Pupils: Pupils are equal, round, and reactive to light.  Cardiovascular:     Rate and Rhythm: Normal rate and regular rhythm.     Heart sounds: No murmur heard. Pulmonary:     Effort: Pulmonary effort is normal. No respiratory distress.     Breath sounds: Normal breath sounds. No wheezing, rhonchi or rales.  Abdominal:     General: Abdomen is flat. Bowel sounds are normal.  Musculoskeletal:     Cervical back: Normal range of motion and neck supple.  Skin:    General: Skin is warm and dry.  Capillary Refill: Capillary refill takes less than 2 seconds.       Neurological:     General: No focal deficit present.     Mental Status: He is alert and oriented to person, place, and time.  Psychiatric:        Mood and Affect: Mood normal.        Behavior: Behavior normal.        Thought Content: Thought content normal.        Judgment: Judgment normal.     Results for orders placed or performed in visit on 10/12/22  Testosterone  Result Value Ref Range   Testosterone 406 264 - 916 ng/dL      Assessment & Plan:   Problem List Items Addressed This Visit   None Visit Diagnoses     Boils    -  Primary   Will treat with Bactrim.  CBC and CMP checked at visit today.  Follow up in 2 weeks for reevaluation.   Relevant Medications   sulfamethoxazole-trimethoprim (BACTRIM DS) 800-160 MG tablet   Other Relevant Orders   CBC w/Diff   Comp Met (CMET)   Urinalysis, Routine w reflex microscopic   Foul smelling urine       UA checked at visit today.  Already treated with bactrim due to skin infection         Follow up plan: Return in about 2 weeks (around 02/05/2023) for Boils follow up.

## 2023-01-22 ENCOUNTER — Ambulatory Visit (INDEPENDENT_AMBULATORY_CARE_PROVIDER_SITE_OTHER): Payer: 59 | Admitting: Nurse Practitioner

## 2023-01-22 ENCOUNTER — Encounter: Payer: Self-pay | Admitting: Nurse Practitioner

## 2023-01-22 VITALS — BP 145/89 | HR 97 | Temp 98.4°F | Ht 74.0 in | Wt 248.2 lb

## 2023-01-22 DIAGNOSIS — R8290 Unspecified abnormal findings in urine: Secondary | ICD-10-CM | POA: Diagnosis not present

## 2023-01-22 DIAGNOSIS — L0292 Furuncle, unspecified: Secondary | ICD-10-CM

## 2023-01-22 LAB — URINALYSIS, ROUTINE W REFLEX MICROSCOPIC
Bilirubin, UA: NEGATIVE
Leukocytes,UA: NEGATIVE
Nitrite, UA: NEGATIVE
RBC, UA: NEGATIVE
Specific Gravity, UA: 1.02 (ref 1.005–1.030)
Urobilinogen, Ur: 1 mg/dL (ref 0.2–1.0)
pH, UA: 7 (ref 5.0–7.5)

## 2023-01-22 LAB — MICROSCOPIC EXAMINATION
Bacteria, UA: NONE SEEN
WBC, UA: NONE SEEN /[HPF] (ref 0–5)

## 2023-01-22 MED ORDER — SULFAMETHOXAZOLE-TRIMETHOPRIM 800-160 MG PO TABS: 1.0000 | ORAL_TABLET | Freq: Two times a day (BID) | ORAL | 0 refills | Status: DC

## 2023-01-23 LAB — CBC WITH DIFFERENTIAL/PLATELET
Basophils Absolute: 0 10*3/uL (ref 0.0–0.2)
Basos: 1 %
EOS (ABSOLUTE): 0.2 10*3/uL (ref 0.0–0.4)
Eos: 3 %
Hematocrit: 37.4 % — ABNORMAL LOW (ref 37.5–51.0)
Hemoglobin: 11 g/dL — ABNORMAL LOW (ref 13.0–17.7)
Immature Grans (Abs): 0 10*3/uL (ref 0.0–0.1)
Immature Granulocytes: 0 %
Lymphocytes Absolute: 1.1 10*3/uL (ref 0.7–3.1)
Lymphs: 15 %
MCH: 21.4 pg — ABNORMAL LOW (ref 26.6–33.0)
MCHC: 29.4 g/dL — ABNORMAL LOW (ref 31.5–35.7)
MCV: 73 fL — ABNORMAL LOW (ref 79–97)
Monocytes Absolute: 0.8 10*3/uL (ref 0.1–0.9)
Monocytes: 11 %
Neutrophils Absolute: 4.8 10*3/uL (ref 1.4–7.0)
Neutrophils: 70 %
Platelets: 302 10*3/uL (ref 150–450)
RBC: 5.14 x10E6/uL (ref 4.14–5.80)
RDW: 15.9 % — ABNORMAL HIGH (ref 11.6–15.4)
WBC: 6.8 10*3/uL (ref 3.4–10.8)

## 2023-01-23 LAB — COMPREHENSIVE METABOLIC PANEL
ALT: 39 [IU]/L (ref 0–44)
AST: 35 [IU]/L (ref 0–40)
Albumin: 4.4 g/dL (ref 3.8–4.9)
Alkaline Phosphatase: 57 [IU]/L (ref 44–121)
BUN/Creatinine Ratio: 7 — ABNORMAL LOW (ref 9–20)
BUN: 8 mg/dL (ref 6–24)
Bilirubin Total: 0.5 mg/dL (ref 0.0–1.2)
CO2: 26 mmol/L (ref 20–29)
Calcium: 9.6 mg/dL (ref 8.7–10.2)
Chloride: 98 mmol/L (ref 96–106)
Creatinine, Ser: 1.12 mg/dL (ref 0.76–1.27)
Globulin, Total: 2.6 g/dL (ref 1.5–4.5)
Glucose: 196 mg/dL — ABNORMAL HIGH (ref 70–99)
Potassium: 3.8 mmol/L (ref 3.5–5.2)
Sodium: 139 mmol/L (ref 134–144)
Total Protein: 7 g/dL (ref 6.0–8.5)
eGFR: 76 mL/min/{1.73_m2} (ref >59)

## 2023-02-05 NOTE — Telephone Encounter (Signed)
Appt scheduled

## 2023-02-05 NOTE — Progress Notes (Unsigned)
02/08/2023 9:10 AM   Darren Allen 02/19/1966 [id number]  Referring provider: Larae Grooms, NP [address] [place],  [place] [address]  Urological history: 1.  Testosterone deficiency -Contributing factors of age, diabetes, former alcohol abuse and chronic pain medications -testosterone level (06/2022) 244 -Hemoglobin/hematocrit (06/2022) 12.9/40.8 -testosterone cypionate 200 mg/mL, 1 cc every 14 days   2. BPH with LU TS -PSA (06/2022) 0.9  3. ED -Contributing factors of age, BPH, diabetes, testosterone deficiency, chronic pain medications, blood pressure medications, COPD, hyperlipidemia, obesity, sleep apnea, former alcohol abuse and smoking  4.  Nephrolithiasis -Spontaneous passage of left ureteral stone in 2012  No chief complaint on file.   HPI: Darren Allen is a 57 y.o. male who presents today for rash on penis.  Previous records reviewed.   He reached out to our office on February 04, 2023 stating he had a rash on the penis and his skin around it.  He had just finished up an antibiotic for a bacteria infection for boils on his chest and back.   According to the PCPs notes, the antibiotic was Septra.   PMH: Past Medical History:  Diagnosis Date   Allergy    Calculus of kidney 02/04/2015   COPD (chronic obstructive pulmonary disease) (HCC)    Diabetes mellitus without complication (HCC)    type 2   Hypertension    Neuropathy     Surgical History: Past Surgical History:  Procedure Laterality Date   APPENDECTOMY     COLONOSCOPY WITH PROPOFOL N/A 12/31/2021   Procedure: COLONOSCOPY WITH PROPOFOL;  Surgeon: Wyline Mood, MD;  Location: [facility] ENDOSCOPY;  Service: Gastroenterology;  Laterality: N/A;   ESOPHAGOGASTRODUODENOSCOPY N/A 12/31/2021   Procedure: ESOPHAGOGASTRODUODENOSCOPY (EGD);  Surgeon: Wyline Mood, MD;  Location: [facility] ENDOSCOPY;  Service: Gastroenterology;  Laterality: N/A;   INCISION AND DRAINAGE PERIRECTAL ABSCESS N/A 02/04/2015    Procedure: IRRIGATION AND DEBRIDEMENT PERIRECTAL ABSCESS;  Surgeon: Ida Rogue, MD;  Location: [facility] ORS;  Service: General;  Laterality: N/A;   INSERTION OF MESH  01/20/2022   Procedure: INSERTION OF MESH;  Surgeon: Leafy Ro, MD;  Location: [facility] ORS;  Service: General;;   KIDNEY STONE SURGERY Right    lung mass removal N/A    ORIF ANKLE FRACTURE Left 03/23/2017   Procedure: OPEN REDUCTION INTERNAL FIXATION (ORIF) ANKLE FRACTURE;  Surgeon: Signa Kell, MD;  Location: [facility] ORS;  Service: Orthopedics;  Laterality: Left;   RECTAL EXAM UNDER ANESTHESIA  02/04/2015   Procedure: RECTAL EXAM UNDER ANESTHESIA;  Surgeon: Ida Rogue, MD;  Location: [facility] ORS;  Service: General;;   SYNDESMOSIS REPAIR Left 03/23/2017   Procedure: SYNDESMOSIS REPAIR;  Surgeon: Signa Kell, MD;  Location: [facility] ORS;  Service: Orthopedics;  Laterality: Left;   UMBILICAL HERNIA REPAIR  01/20/2022   Procedure: HERNIA REPAIR UMBILICAL ADULT;  Surgeon: Leafy Ro, MD;  Location: [facility] ORS;  Service: General;;   XI ROBOTIC ASSISTED PARAESOPHAGEAL HERNIA REPAIR N/A 01/20/2022   Procedure: XI ROBOTIC ASSISTED PARAESOPHAGEAL HERNIA REPAIR, CONVERTED TO OPEN, RNFA to assist;  Surgeon: Leafy Ro, MD;  Location: [facility] ORS;  Service: General;  Laterality: N/A;    Home Medications:  Allergies as of 02/08/2023       Reactions   Glipizide Other (See Comments), Palpitations   Shaky, feel bad Other reaction(s): Dizziness   Cephalexin Rash   Duloxetine Anxiety, Nausea Only   Dupilumab Rash        Medication List        Accurate  as of February 05, 2023  9:10 AM. If you have any questions, ask your nurse or doctor.          acetaminophen 500 MG tablet Commonly known as: TYLENOL Take 2 tablets (1,000 mg total) by mouth every 8 (eight) hours.   amitriptyline 10 MG tablet Commonly known as: ELAVIL Take 40 mg by mouth at bedtime.   ascorbic acid 500 MG tablet Commonly known as: VITAMIN  C Take by mouth daily.   BD SafetyGlide Needle 18G X 1-1/2" Misc Generic drug: NEEDLE (DISP) 18 G 1 mg by Does not apply route every 7 (seven) days.   BD SafetyGlide Needle 21G X 1" Misc Generic drug: NEEDLE (DISP) 21 G 1 mg by Does not apply route every 7 (seven) days.   benzoyl peroxide 5 % external wash Generic drug: benzoyl peroxide Apply topically 2 (two) times daily.   Breztri Aerosphere 160-9-4.8 MCG/ACT Aero Generic drug: Budeson-Glycopyrrol-Formoterol Inhale into the lungs.   EPINEPHrine 0.3 mg/0.3 mL Soaj injection Commonly known as: EPI-PEN Inject 0.3 mg into the muscle as needed.   ferrous sulfate 325 (65 FE) MG tablet Take 325 mg by mouth daily with breakfast.   fluticasone 50 MCG/ACT nasal spray Commonly known as: FLONASE Place into both nostrils.   gabapentin 600 MG tablet Commonly known as: NEURONTIN Take 600 mg by mouth 3 (three) times daily.   HumuLIN 70/30 KwikPen (70-30) 100 UNIT/ML KwikPen Generic drug: insulin isophane & regular human KwikPen Inject 58 Units into the skin 2 (two) times daily with a meal. 60 in the a.m. and 40u qhs   hydrochlorothiazide 25 MG tablet Commonly known as: HYDRODIURIL Take 1 tablet (25 mg total) by mouth daily.   HYDROcodone-acetaminophen 5-325 MG tablet Commonly known as: NORCO/VICODIN Take 1 tablet by mouth 2 (two) times daily as needed. Must last 30 days.   HYDROcodone-acetaminophen 5-325 MG tablet Commonly known as: NORCO/VICODIN Take 1 tablet by mouth 2 (two) times daily as needed. Must last 30 days.   HYDROcodone-acetaminophen 5-325 MG tablet Commonly known as: NORCO/VICODIN Take 1 tablet by mouth 2 (two) times daily as needed. Must last 30 days.   ipratropium-albuterol 0.5-2.5 (3) MG/3ML Soln Commonly known as: DUONEB Take by nebulization.   Magnesium Oxide -Mg Supplement 500 MG Tabs Take 1 tablet by mouth daily.   metFORMIN 500 MG 24 hr tablet Commonly known as: GLUCOPHAGE-XR TAKE 4 TABLETS(2000  MG) BY MOUTH DAILY   montelukast 10 MG tablet Commonly known as: SINGULAIR Take 10 mg by mouth daily.   ondansetron 4 MG tablet Commonly known as: Zofran Take 1 tablet (4 mg total) by mouth every 8 (eight) hours as needed for nausea or vomiting.   ProAir HFA 108 (90 Base) MCG/ACT inhaler Generic drug: albuterol Inhale 2 puffs into the lungs every 6 (six) hours as needed.   rosuvastatin 40 MG tablet Commonly known as: CRESTOR TAKE 1 TABLET(40 MG) BY MOUTH DAILY   Semaglutide (2 MG/DOSE) 8 MG/3ML Sopn Inject into the skin.   sulfamethoxazole-trimethoprim 800-160 MG tablet Commonly known as: BACTRIM DS Take 1 tablet by mouth 2 (two) times daily.   tadalafil 5 MG tablet Commonly known as: CIALIS Take 1 tablet (5 mg total) by mouth daily as needed for erectile dysfunction.   tamsulosin 0.4 MG Caps capsule Commonly known as: FLOMAX Take 1 capsule (0.4 mg total) by mouth daily.   testosterone cypionate 200 MG/ML injection Commonly known as: DEPOTESTOSTERONE CYPIONATE Inject 1 mL (200 mg total) into the muscle every 14 (  fourteen) days. Inject 1 cc every 10 days   tretinoin 0.025 % cream Commonly known as: RETIN-A Apply topically at bedtime.   triamcinolone cream 0.1 % Commonly known as: KENALOG Apply 1 Application topically 2 (two) times daily.        Allergies:  Allergies  Allergen Reactions   Glipizide Other (See Comments) and Palpitations    Shaky, feel bad Other reaction(s): Dizziness   Cephalexin Rash   Duloxetine Anxiety and Nausea Only   Dupilumab Rash    Family History: Family History  Problem Relation Age of Onset   Cancer Mother 1       Lung   Cancer Father        Colon   Heart disease Father    Alcohol abuse Father    Cancer Brother 34       Esophageal   Diabetes Brother    Heart disease Brother     Social History:  reports that he has quit smoking. His smoking use included cigarettes. He has a 17 pack-year smoking history. He has been  exposed to tobacco smoke. He has never used smokeless tobacco. He reports that he does not currently use alcohol after a past usage of about 12.0 standard drinks of alcohol per week. He reports that he does not use drugs.  ROS: Pertinent ROS in HPI  Physical Exam: There were no vitals taken for this visit.  Constitutional:  Well nourished. Alert and oriented, No acute distress. HEENT: [place] AT, moist mucus membranes.  Trachea midline, no masses. Cardiovascular: No clubbing, cyanosis, or edema. Respiratory: Normal respiratory effort, no increased work of breathing. GI: Abdomen is soft, non tender, non distended, no abdominal masses. Liver and spleen not palpable.  No hernias appreciated.  Stool sample for occult testing is not indicated.   GU: No CVA tenderness.  No bladder fullness or masses.  Patient with circumcised/uncircumcised phallus. ***Foreskin easily retracted***  Urethral meatus is patent.  No penile discharge. No penile lesions or rashes. Scrotum without lesions, cysts, rashes and/or edema.  Testicles are located scrotally bilaterally. No masses are appreciated in the testicles. Left and right epididymis are normal. Rectal: Patient with  normal sphincter tone. Anus and perineum without scarring or rashes. No rectal masses are appreciated. Prostate is approximately *** grams, *** nodules are appreciated. Seminal vesicles are normal. Skin: No rashes, bruises or suspicious lesions. Lymph: No cervical or inguinal adenopathy. Neurologic: Grossly intact, no focal deficits, moving all 4 extremities. Psychiatric: Normal mood and affect.   Laboratory Data: CMP     Component Value Date/Time   NA 139 01/22/2023 1022   NA 135 (L) 03/13/2014 2116   K 3.8 01/22/2023 1022   K 4.4 03/13/2014 2116   CL 98 01/22/2023 1022   CL 103 03/13/2014 2116   CO2 26 01/22/2023 1022   CO2 26 03/13/2014 2116   GLUCOSE 196 (H) 01/22/2023 1022   GLUCOSE 175 (H) 02/15/2022 1914   GLUCOSE 285 (H) 03/13/2014  2116   BUN 8 01/22/2023 1022   BUN 21 (H) 03/13/2014 2116   CREATININE 1.12 01/22/2023 1022   CREATININE 1.08 03/13/2014 2116   CALCIUM 9.6 01/22/2023 1022   CALCIUM 9.1 03/13/2014 2116   PROT 7.0 01/22/2023 1022   PROT 7.6 03/15/2012 0109   ALBUMIN 4.4 01/22/2023 1022   ALBUMIN 3.7 03/15/2012 0109   AST 35 01/22/2023 1022   AST 16 03/15/2012 0109   ALT 39 01/22/2023 1022   ALT 29 03/15/2012 0109   ALKPHOS 57  01/22/2023 1022   ALKPHOS 92 03/15/2012 0109   BILITOT 0.5 01/22/2023 1022   BILITOT 0.3 03/15/2012 0109   EGFR 76 01/22/2023 1022   GFRNONAA >60 02/15/2022 1914   GFRNONAA >60 03/13/2014 2116   GFRNONAA >60 03/15/2012 0109    CBC    Component Value Date/Time   WBC 6.8 01/22/2023 1022   WBC 6.6 02/15/2022 1914   RBC 5.14 01/22/2023 1022   RBC 3.91 (L) 02/15/2022 1914   HGB 11.0 (L) 01/22/2023 1022   HCT 37.4 (L) 01/22/2023 1022   PLT 302 01/22/2023 1022   MCV 73 (L) 01/22/2023 1022   MCV 83 03/13/2014 2116   MCH 21.4 (L) 01/22/2023 1022   MCH 27.1 02/15/2022 1914   MCHC 29.4 (L) 01/22/2023 1022   MCHC 31.7 02/15/2022 1914   RDW 15.9 (H) 01/22/2023 1022   RDW 15.2 (H) 03/13/2014 2116   LYMPHSABS 1.1 01/22/2023 1022   MONOABS 1.2 (H) 01/24/2022 0542   EOSABS 0.2 01/22/2023 1022   BASOSABS 0.0 01/22/2023 1022    Urinalysis Urinalysis    Component Value Date/Time   COLORURINE YELLOW (A) 01/21/2022 1554   APPEARANCEUR Clear 01/22/2023 1021   LABSPEC 1.010 01/21/2022 1554   LABSPEC 1.031 03/15/2012 0110   PHURINE 5.0 01/21/2022 1554   GLUCOSEU Trace (A) 01/22/2023 1021   GLUCOSEU Negative 03/15/2012 0110   HGBUR LARGE (A) 01/21/2022 1554   BILIRUBINUR Negative 01/22/2023 1021   BILIRUBINUR Negative 03/15/2012 0110   KETONESUR NEGATIVE 01/21/2022 1554   PROTEINUR 2+ (A) 01/22/2023 1021   PROTEINUR 30 (A) 01/21/2022 1554   NITRITE Negative 01/22/2023 1021   NITRITE NEGATIVE 01/21/2022 1554   LEUKOCYTESUR Negative 01/22/2023 1021   LEUKOCYTESUR TRACE (A)  01/21/2022 1554   LEUKOCYTESUR Negative 03/15/2012 0110   Component     Latest Ref Rng 01/22/2023  WBC, UA     0 - 5 /hpf None seen   Bacteria, UA     None seen/Few  None seen   RBC, Urine     0 - 2 /hpf 0-2   Epithelial Cells (non renal)     0 - 10 /hpf 0-10   Mucus, UA     Not Estab.  Present !     Legend: ! Abnormal I have reviewed the labs.   Pertinent Imaging: N/A   Assessment & Plan:    1. Penile rash -***  2. Testosterone deficiency  -testosterone levels pending -hemoglobin/hematocrit pending  -continue injections to 1.0 cc every 7 days  3. BPH with LUTS -PSA pending -bothersome symptoms of post-void dribbling -continue conservative management, avoiding bladder irritants and timed voiding's -continue tamsulosin 0.4 mg to twice daily  4. Erectile dysfunction:    -continue tadalafil 5 mg, on-demand-dosing  5.  Nephrolithiasis -No issues with renal colic or passage of fragments since last visit   No follow-ups on file.  These notes generated with voice recognition software. I apologize for typographical errors.  Cloretta Ned  [facility] [address]  [address] [place], [place] [address] [phone]

## 2023-02-06 ENCOUNTER — Other Ambulatory Visit: Payer: Self-pay | Admitting: Urology

## 2023-02-06 DIAGNOSIS — E349 Endocrine disorder, unspecified: Secondary | ICD-10-CM

## 2023-02-08 ENCOUNTER — Encounter: Payer: Self-pay | Admitting: Urology

## 2023-02-08 ENCOUNTER — Ambulatory Visit: Payer: 59 | Admitting: Urology

## 2023-02-08 ENCOUNTER — Other Ambulatory Visit: Payer: Self-pay | Admitting: Urology

## 2023-02-08 DIAGNOSIS — N138 Other obstructive and reflux uropathy: Secondary | ICD-10-CM

## 2023-02-08 DIAGNOSIS — E291 Testicular hypofunction: Secondary | ICD-10-CM

## 2023-02-08 DIAGNOSIS — N200 Calculus of kidney: Secondary | ICD-10-CM

## 2023-02-08 DIAGNOSIS — R21 Rash and other nonspecific skin eruption: Secondary | ICD-10-CM | POA: Diagnosis not present

## 2023-02-08 DIAGNOSIS — N529 Male erectile dysfunction, unspecified: Secondary | ICD-10-CM

## 2023-02-08 DIAGNOSIS — N401 Enlarged prostate with lower urinary tract symptoms: Secondary | ICD-10-CM

## 2023-02-08 DIAGNOSIS — Z87442 Personal history of urinary calculi: Secondary | ICD-10-CM

## 2023-02-08 MED ORDER — NYSTATIN-TRIAMCINOLONE 100000-0.1 UNIT/GM-% EX OINT: 1.0000 | TOPICAL_OINTMENT | Freq: Two times a day (BID) | CUTANEOUS | 0 refills | Status: DC

## 2023-02-10 ENCOUNTER — Encounter: Payer: Self-pay | Admitting: Nurse Practitioner

## 2023-02-10 ENCOUNTER — Ambulatory Visit (INDEPENDENT_AMBULATORY_CARE_PROVIDER_SITE_OTHER): Payer: 59 | Admitting: Nurse Practitioner

## 2023-02-10 VITALS — BP 136/85 | HR 104 | Temp 98.6°F | Ht 72.0 in | Wt 249.4 lb

## 2023-02-10 DIAGNOSIS — L0292 Furuncle, unspecified: Secondary | ICD-10-CM

## 2023-02-10 MED ORDER — HYDROCHLOROTHIAZIDE 25 MG PO TABS: 25.0000 mg | ORAL_TABLET | Freq: Every day | ORAL | 1 refills | Status: DC

## 2023-02-10 NOTE — Progress Notes (Signed)
BP 136/85   Pulse (!) 104   Temp 98.6 F (37 C) (Oral)   Ht 6' (1.829 m)   Wt 249 lb 6.4 oz (113.1 kg)   SpO2 96%   BMI 33.82 kg/m    Subjective:    Patient ID: Darren Allen, male    DOB: 05/19/1965, 57 y.o.   MRN: [id number]  HPI: Darren Allen is a 57 y.o. male  Chief Complaint  Patient presents with   Follow-up    2 week    Blister    Has become better, new ones are popping up on the scalp but does not feel as bad as before    Patient states his boils started about 6 weeks.  States the bactrim cleared up the ones on his chest.  A few days ago another one came up.  Unsure why these keep happening.  Has not seen his dermatologist recently.    Relevant past medical, surgical, family and social history reviewed and updated as indicated. Interim medical history since our last visit reviewed. Allergies and medications reviewed and updated.  Review of Systems  Skin:        boils    Per HPI unless specifically indicated above     Objective:    BP 136/85   Pulse (!) 104   Temp 98.6 F (37 C) (Oral)   Ht 6' (1.829 m)   Wt 249 lb 6.4 oz (113.1 kg)   SpO2 96%   BMI 33.82 kg/m   Wt Readings from Last 3 Encounters:  02/10/23 249 lb 6.4 oz (113.1 kg)  01/22/23 248 lb 3.2 oz (112.6 kg)  01/07/23 240 lb (108.9 kg)    Physical Exam Vitals and nursing note reviewed.  Constitutional:      General: He is not in acute distress.    Appearance: Normal appearance. He is not ill-appearing, toxic-appearing or diaphoretic.  HENT:     Head: Normocephalic.     Right Ear: External ear normal.     Left Ear: External ear normal.     Nose: Nose normal. No congestion or rhinorrhea.     Mouth/Throat:     Mouth: Mucous membranes are moist.  Eyes:     General:        Right eye: No discharge.        Left eye: No discharge.     Extraocular Movements: Extraocular movements intact.     Conjunctiva/sclera: Conjunctivae normal.     Pupils: Pupils are equal, round, and reactive  to light.  Cardiovascular:     Rate and Rhythm: Normal rate and regular rhythm.     Heart sounds: No murmur heard. Pulmonary:     Effort: Pulmonary effort is normal. No respiratory distress.     Breath sounds: Normal breath sounds. No wheezing, rhonchi or rales.  Abdominal:     General: Abdomen is flat. Bowel sounds are normal.  Musculoskeletal:     Cervical back: Normal range of motion and neck supple.  Skin:    General: Skin is warm and dry.     Capillary Refill: Capillary refill takes less than 2 seconds.       Neurological:     General: No focal deficit present.     Mental Status: He is alert and oriented to person, place, and time.  Psychiatric:        Mood and Affect: Mood normal.        Behavior: Behavior normal.  Thought Content: Thought content normal.        Judgment: Judgment normal.     Results for orders placed or performed in visit on 01/22/23  Microscopic Examination   Urine  Result Value Ref Range   WBC, UA None seen 0 - 5 /hpf   RBC, Urine 0-2 0 - 2 /hpf   Epithelial Cells (non renal) 0-10 0 - 10 /hpf   Mucus, UA Present (A) Not Estab.   Bacteria, UA None seen None seen/Few  CBC w/Diff  Result Value Ref Range   WBC 6.8 3.4 - 10.8 x10E3/uL   RBC 5.14 4.14 - 5.80 x10E6/uL   Hemoglobin 11.0 (L) 13.0 - 17.7 g/dL   Hematocrit 47.8 (L) 29.5 - 51.0 %   MCV 73 (L) 79 - 97 fL   MCH 21.4 (L) 26.6 - 33.0 pg   MCHC 29.4 (L) 31.5 - 35.7 g/dL   RDW 62.1 (H) 30.8 - 65.7 %   Platelets 302 150 - 450 x10E3/uL   Neutrophils 70 Not Estab. %   Lymphs 15 Not Estab. %   Monocytes 11 Not Estab. %   Eos 3 Not Estab. %   Basos 1 Not Estab. %   Neutrophils Absolute 4.8 1.4 - 7.0 x10E3/uL   Lymphocytes Absolute 1.1 0.7 - 3.1 x10E3/uL   Monocytes Absolute 0.8 0.1 - 0.9 x10E3/uL   EOS (ABSOLUTE) 0.2 0.0 - 0.4 x10E3/uL   Basophils Absolute 0.0 0.0 - 0.2 x10E3/uL   Immature Granulocytes 0 Not Estab. %   Immature Grans (Abs) 0.0 0.0 - 0.1 x10E3/uL  Comp Met (CMET)   Result Value Ref Range   Glucose 196 (H) 70 - 99 mg/dL   BUN 8 6 - 24 mg/dL   Creatinine, Ser 8.46 0.76 - 1.27 mg/dL   eGFR 76 >96 EX/BMW/4.13   BUN/Creatinine Ratio 7 (L) 9 - 20   Sodium 139 134 - 144 mmol/L   Potassium 3.8 3.5 - 5.2 mmol/L   Chloride 98 96 - 106 mmol/L   CO2 26 20 - 29 mmol/L   Calcium 9.6 8.7 - 10.2 mg/dL   Total Protein 7.0 6.0 - 8.5 g/dL   Albumin 4.4 3.8 - 4.9 g/dL   Globulin, Total 2.6 1.5 - 4.5 g/dL   Bilirubin Total 0.5 0.0 - 1.2 mg/dL   Alkaline Phosphatase 57 44 - 121 IU/L   AST 35 0 - 40 IU/L   ALT 39 0 - 44 IU/L  Urinalysis, Routine w reflex microscopic  Result Value Ref Range   Specific Gravity, UA 1.020 1.005 - 1.030   pH, UA 7.0 5.0 - 7.5   Color, UA Yellow Yellow   Appearance Ur Clear Clear   Leukocytes,UA Negative Negative   Protein,UA 2+ (A) Negative/Trace   Glucose, UA Trace (A) Negative   Ketones, UA Trace (A) Negative   RBC, UA Negative Negative   Bilirubin, UA Negative Negative   Urobilinogen, Ur 1.0 0.2 - 1.0 mg/dL   Nitrite, UA Negative Negative   Microscopic Examination See below:       Assessment & Plan:   Problem List Items Addressed This Visit   None Visit Diagnoses     Recurrent boils    -  Primary   Will check rheumatology labs at visit today.  Recommend following up with Dermatology. Will await lab results and make recommendations based on results.   Relevant Orders   Rheumatoid factor   ANA   ANA+ENA+DNA/DS+Antich+Centr   CK   Uric acid  Ehrlichia antibody panel   Comprehensive metabolic panel   CBC with Differential/Platelet         Follow up plan: No follow-ups on file.

## 2023-02-14 LAB — CBC WITH DIFFERENTIAL/PLATELET
Basophils Absolute: 0.1 10*3/uL (ref 0.0–0.2)
Basos: 1 %
EOS (ABSOLUTE): 0.3 10*3/uL (ref 0.0–0.4)
Eos: 6 %
Hematocrit: 38.6 % (ref 37.5–51.0)
Hemoglobin: 11.8 g/dL — ABNORMAL LOW (ref 13.0–17.7)
Immature Grans (Abs): 0 10*3/uL (ref 0.0–0.1)
Immature Granulocytes: 1 %
Lymphocytes Absolute: 1.3 10*3/uL (ref 0.7–3.1)
Lymphs: 23 %
MCH: 22.2 pg — ABNORMAL LOW (ref 26.6–33.0)
MCHC: 30.6 g/dL — ABNORMAL LOW (ref 31.5–35.7)
MCV: 73 fL — ABNORMAL LOW (ref 79–97)
Monocytes Absolute: 0.7 10*3/uL (ref 0.1–0.9)
Monocytes: 12 %
Neutrophils Absolute: 3.3 10*3/uL (ref 1.4–7.0)
Neutrophils: 57 %
Platelets: 307 10*3/uL (ref 150–450)
RBC: 5.32 x10E6/uL (ref 4.14–5.80)
RDW: 17.2 % — ABNORMAL HIGH (ref 11.6–15.4)
WBC: 5.7 10*3/uL (ref 3.4–10.8)

## 2023-02-14 LAB — COMPREHENSIVE METABOLIC PANEL
ALT: 46 [IU]/L — ABNORMAL HIGH (ref 0–44)
AST: 50 [IU]/L — ABNORMAL HIGH (ref 0–40)
Albumin: 4.4 g/dL (ref 3.8–4.9)
Alkaline Phosphatase: 60 [IU]/L (ref 44–121)
BUN/Creatinine Ratio: 11 (ref 9–20)
BUN: 12 mg/dL (ref 6–24)
Bilirubin Total: 0.3 mg/dL (ref 0.0–1.2)
CO2: 21 mmol/L (ref 20–29)
Calcium: 9.6 mg/dL (ref 8.7–10.2)
Chloride: 98 mmol/L (ref 96–106)
Creatinine, Ser: 1.07 mg/dL (ref 0.76–1.27)
Globulin, Total: 2.5 g/dL (ref 1.5–4.5)
Glucose: 195 mg/dL — ABNORMAL HIGH (ref 70–99)
Potassium: 3.7 mmol/L (ref 3.5–5.2)
Sodium: 138 mmol/L (ref 134–144)
Total Protein: 6.9 g/dL (ref 6.0–8.5)
eGFR: 80 mL/min/{1.73_m2} (ref >59)

## 2023-02-14 LAB — EHRLICHIA ANTIBODY PANEL
E. Chaffeensis (HME) IgM Titer: NEGATIVE
E.Chaffeensis (HME) IgG: NEGATIVE
HGE IgG Titer: NEGATIVE
HGE IgM Titer: NEGATIVE

## 2023-02-14 LAB — ANA+ENA+DNA/DS+ANTICH+CENTR
Anti JO-1: <0.2 AI (ref 0.0–0.9)
Centromere Ab Screen: <0.2 AI (ref 0.0–0.9)
Chromatin Ab SerPl-aCnc: <0.2 AI (ref 0.0–0.9)
ENA RNP Ab: <0.2 AI (ref 0.0–0.9)
ENA SM Ab Ser-aCnc: <0.2 AI (ref 0.0–0.9)
ENA SSA (RO) Ab: <0.2 AI (ref 0.0–0.9)
ENA SSB (LA) Ab: <0.2 AI (ref 0.0–0.9)
Scleroderma (Scl-70) (ENA) Antibody, IgG: <0.2 AI (ref 0.0–0.9)
dsDNA Ab: <1 [IU]/mL (ref 0–9)

## 2023-02-14 LAB — CK: Total CK: 547 U/L (ref 41–331)

## 2023-02-14 LAB — ANA: Anti Nuclear Antibody (ANA): NEGATIVE

## 2023-02-14 LAB — RHEUMATOID FACTOR: Rheumatoid fact SerPl-aCnc: 10.2 [IU]/mL (ref <14.0)

## 2023-02-14 LAB — URIC ACID: Uric Acid: 6 mg/dL (ref 3.8–8.4)

## 2023-02-15 ENCOUNTER — Encounter: Payer: Self-pay | Admitting: Nurse Practitioner

## 2023-02-17 ENCOUNTER — Ambulatory Visit: Payer: 59 | Attending: Pain Medicine | Admitting: Pain Medicine

## 2023-02-17 ENCOUNTER — Encounter: Payer: Self-pay | Admitting: Pain Medicine

## 2023-02-17 VITALS — BP 151/88 | HR 104 | Temp 98.1°F | Resp 18 | Ht 74.0 in | Wt 250.0 lb

## 2023-02-17 DIAGNOSIS — M47816 Spondylosis without myelopathy or radiculopathy, lumbar region: Secondary | ICD-10-CM | POA: Diagnosis not present

## 2023-02-17 DIAGNOSIS — M5137 Other intervertebral disc degeneration, lumbosacral region with discogenic back pain only: Secondary | ICD-10-CM | POA: Diagnosis present

## 2023-02-17 DIAGNOSIS — Z79899 Other long term (current) drug therapy: Secondary | ICD-10-CM | POA: Insufficient documentation

## 2023-02-17 DIAGNOSIS — M5459 Other low back pain: Secondary | ICD-10-CM | POA: Insufficient documentation

## 2023-02-17 DIAGNOSIS — M79672 Pain in left foot: Secondary | ICD-10-CM | POA: Insufficient documentation

## 2023-02-17 DIAGNOSIS — R937 Abnormal findings on diagnostic imaging of other parts of musculoskeletal system: Secondary | ICD-10-CM | POA: Diagnosis present

## 2023-02-17 DIAGNOSIS — Z79891 Long term (current) use of opiate analgesic: Secondary | ICD-10-CM | POA: Diagnosis present

## 2023-02-17 DIAGNOSIS — M545 Low back pain, unspecified: Secondary | ICD-10-CM | POA: Insufficient documentation

## 2023-02-17 DIAGNOSIS — G8929 Other chronic pain: Secondary | ICD-10-CM | POA: Diagnosis present

## 2023-02-17 DIAGNOSIS — G894 Chronic pain syndrome: Secondary | ICD-10-CM | POA: Insufficient documentation

## 2023-02-17 DIAGNOSIS — M79671 Pain in right foot: Secondary | ICD-10-CM | POA: Diagnosis present

## 2023-02-17 DIAGNOSIS — M47817 Spondylosis without myelopathy or radiculopathy, lumbosacral region: Secondary | ICD-10-CM | POA: Insufficient documentation

## 2023-02-17 MED ORDER — NALOXONE HCL 4 MG/0.1ML NA LIQD: 1.0000 | NASAL | 0 refills | Status: DC | PRN

## 2023-02-17 MED ORDER — HYDROCODONE-ACETAMINOPHEN 5-325 MG PO TABS
1.0000 | ORAL_TABLET | Freq: Two times a day (BID) | ORAL | 0 refills | Status: DC | PRN
Start: 2023-02-21 — End: 2023-03-31

## 2023-02-17 MED ORDER — HYDROCODONE-ACETAMINOPHEN 5-325 MG PO TABS: 1.0000 | ORAL_TABLET | Freq: Two times a day (BID) | ORAL | 0 refills | Status: DC | PRN

## 2023-02-17 NOTE — Progress Notes (Signed)
Nursing Pain Medication Assessment:  Safety precautions to be maintained throughout the outpatient stay will include: orient to surroundings, keep bed in low position, maintain call bell within reach at all times, provide assistance with transfer out of bed and ambulation.  Medication Inspection Compliance: Pill count conducted under aseptic conditions, in front of the patient. Neither the pills nor the bottle was removed from the patient's sight at any time. Once count was completed pills were immediately returned to the patient in their original bottle.  Medication: Hydrocodone/APAP Pill/Patch Count:  12 of 60 pills remain Pill/Patch Appearance: Markings consistent with prescribed medication Bottle Appearance: Standard pharmacy container. Clearly labeled. Filled Date: 2 / 09 / 2024 Last Medication intake:  Today

## 2023-02-17 NOTE — Progress Notes (Signed)
PROVIDER NOTE: Information contained herein reflects review and annotations entered in association with encounter. Interpretation of such information and data should be left to medically-trained personnel. Information provided to patient can be located elsewhere in the medical record under "Patient Instructions". Document created using STT-dictation technology, any transcriptional errors that may result from process are unintentional.    Patient: Darren Allen  Service Category: E/M  Provider: Oswaldo Done, MD  DOB: 1965-12-03  DOS: 02/17/2023  Referring Provider: Larae Grooms, NP  MRN: [id number]  Specialty: Interventional Pain Management  PCP: Larae Grooms, NP  Type: Established Patient  Setting: Ambulatory outpatient    Location: Office  Delivery: Face-to-face     HPI  [name], a 57 y.o. year old male, is here today because of his Chronic bilateral low back pain without sciatica [M54.50, G89.29]. [name] primary complain today is Back Pain (L>R)  Pertinent problems: Darren Allen has Ankle fracture; Neuropathy; Diabetic peripheral neuropathy (HCC); Gout; Chronic ankle pain (Bilateral); Chronic low back pain (1ry area of Pain) (Bilateral) (R>L) w/o sciatica; Other acquired hammer toe; Chronic pain syndrome; Lumbar facet syndrome; Lumbosacral radiculopathy at S1 (Left); Chronic feet pain (2ry area of Pain) (Bilateral); Chronic ankle pain (Left); Abnormal NCS (nerve conduction studies) (02/20/2020); Abnormal MRI, lumbar spine (03/11/2020); DDD (degenerative disc disease), lumbosacral; Lumbosacral lateral recess stenosis (Left: L5-S1); Chronic low back pain (Bilateral) w/ sciatica (Left); Abnormal MRI, thoracic spine (07/05/2020); Chronic low back pain (Bilateral) w/ sciatica (Right); Chronic lower extremity pain (intermittent) (Right); Chronic lower extremity pain (intermittent) (Left); Lumbar radiculitis (Right); Lumbar radiculitis (Left); Lumbar facet joint pain; and  Spondylosis without myelopathy or radiculopathy, lumbosacral region on their pertinent problem list. Pain Assessment: Severity of Chronic pain is reported as a 2 /10. Location: Back Lower, Right, Left/Radiates to hips and bottocks (left side is worse). Onset: More than a month ago. Quality: Constant, Aching, Dull, Penetrating. Timing: Constant. Modifying factor(s): Pain medication and gabapentin. Vitals:  height is 6\' 2"  (1.88 m) and weight is 250 lb (113.4 kg). His temporal temperature is 98.1 F (36.7 C). His blood pressure is 151/88 (abnormal) and his pulse is 104 (abnormal). His respiration is 18 and oxygen saturation is 98%.  BMI: Estimated body mass index is 32.1 kg/m as calculated from the following:   Height as of this encounter: 6\' 2"  (1.88 m).   Weight as of this encounter: 250 lb (113.4 kg). Last encounter: 01/07/2023. Last procedure: 12/24/2022.  Reason for encounter: medication management.  The patient indicates doing well with the current medication regimen. No adverse reactions or side effects reported to the medications.  Discussed the use of AI scribe software for clinical note transcription with the patient, who gave verbal consent to proceed.  History of Present Illness   The patient, with a history of chronic back pain, reports a recurrence of pain after a brief period of relief following a right-sided L5 lumbar epidural steroid injection performed on October 10th. The pain, initially relieved for a couple of weeks, returned and shifted from the right to the center and then to the left side of the lower back. The patient denies any leg pain but reports discomfort in the left buttock. The pain is exacerbated by hyperextension and rotation of the lumbar spine, particularly towards the left side. The patient also mentions difficulty with tasks such as washing dishes due to the back pain. The patient denies any adverse reactions to their current medications and reports no new  medications. They also deny  taking any blood thinners. The patient has expressed interest in implants for pain management in the feet.     RTCB: 05/22/2023   Pharmacotherapy Assessment  Analgesic: Hydrocodone/APAP 5/325 tablet, 1 tab p.o. twice daily (#60) MME/day: 10 mg/day   Monitoring: [place] PMP: PDMP reviewed during this encounter.       Pharmacotherapy: No side-effects or adverse reactions reported. Compliance: No problems identified. Effectiveness: Clinically acceptable.  Earlyne Iba, RN  02/17/2023 11:46 AM  Sign when Signing Visit Nursing Pain Medication Assessment:  Safety precautions to be maintained throughout the outpatient stay will include: orient to surroundings, keep bed in low position, maintain call bell within reach at all times, provide assistance with transfer out of bed and ambulation.  Medication Inspection Compliance: Pill count conducted under aseptic conditions, in front of the patient. Neither the pills nor the bottle was removed from the patient's sight at any time. Once count was completed pills were immediately returned to the patient in their original bottle.  Medication: Hydrocodone/APAP Pill/Patch Count:  12 of 60 pills remain Pill/Patch Appearance: Markings consistent with prescribed medication Bottle Appearance: Standard pharmacy container. Clearly labeled. Filled Date: 91 / 09 / 2024 Last Medication intake:  Today    No results found for: "CBDTHCR" No results found for: "D8THCCBX" No results found for: "D9THCCBX"  UDS:  Summary  Date Value Ref Range Status  08/24/2022 Note  Final    Comment:    ==================================================================== [employer] Select 13 (MW) ==================================================================== Test                             Result       Flag       Units  Drug Present and Declared for Prescription Verification   Hydrocodone                    312          EXPECTED   ng/mg creat    Norhydrocodone                 398          EXPECTED   ng/mg creat    Sources of hydrocodone include scheduled prescription medications.    Norhydrocodone is an expected metabolite of hydrocodone.  ==================================================================== Test                      Result    Flag   Units      Ref Range   Creatinine              161              mg/dL      >=03 ==================================================================== Declared Medications:  The flagging and interpretation on this report are based on the  following declared medications.  Unexpected results may arise from  inaccuracies in the declared medications.   **Note: The testing scope of this panel includes these medications:   Hydrocodone (Norco)   **Note: The testing scope of this panel does not include the  following reported medications:   Acetaminophen (Tylenol)  Acetaminophen (Norco)  Albuterol (Proair HFA)  Albuterol (Duoneb)  Amitriptyline (Elavil)  Epinephrine (EpiPen)  Fluticasone (Trelegy)  Fluticasone (Flonase)  Gabapentin (Neurontin)  Hydrochlorothiazide (Hydrodiuril)  Insulin (Humulin)  Ipratropium (Duoneb)  Iron  Lisinopril (Zestril)  Magnesium (Mag-Ox)  Meloxicam (Mobic)  Metformin (Glucophage)  Montelukast (Singulair)  Naloxone (Narcan)  Ondansetron (Zofran)  Prednisone (  Deltasone)  Rosuvastatin (Crestor)  Semaglutide  Sulfamethoxazole (Bactrim)  Tadalafil (Cialis)  Tamsulosin (Flomax)  Testosterone  Triamcinolone (Kenalog)  Trimethoprim (Bactrim)  Umeclidinium (Trelegy)  Vilanterol (Trelegy)  Vitamin C ==================================================================== For clinical consultation, please call [phone]. ====================================================================       ROS  Constitutional: Denies any fever or chills Gastrointestinal: No reported hemesis, hematochezia, vomiting, or acute GI distress Musculoskeletal:  Denies any acute onset joint swelling, redness, loss of ROM, or weakness Neurological: No reported episodes of acute onset apraxia, aphasia, dysarthria, agnosia, amnesia, paralysis, loss of coordination, or loss of consciousness  Medication Review  Budeson-Glycopyrrol-Formoterol, EPINEPHrine, HYDROcodone-acetaminophen, Magnesium Oxide -Mg Supplement, NEEDLE (DISP) 18 G, NEEDLE (DISP) 21 G, Semaglutide (2 MG/DOSE), acetaminophen, albuterol, amitriptyline, ascorbic acid, benzoyl peroxide, ferrous sulfate, fluticasone, gabapentin, hydrochlorothiazide, insulin isophane & regular human KwikPen, ipratropium-albuterol, metFORMIN, montelukast, naloxone, nystatin-triamcinolone ointment, ondansetron, rosuvastatin, tadalafil, tamsulosin, testosterone cypionate, tretinoin, and triamcinolone cream  History Review  Allergy: Darren Allen is allergic to glipizide, cephalexin, duloxetine, and dupilumab. Drug: Darren Allen  reports no history of drug use. Alcohol:  reports that he does not currently use alcohol after a past usage of about 12.0 standard drinks of alcohol per week. Tobacco:  reports that he has quit smoking. His smoking use included cigarettes. He has a 17 pack-year smoking history. He has been exposed to tobacco smoke. He has never used smokeless tobacco. Social: Darren Allen  reports that he has quit smoking. His smoking use included cigarettes. He has a 17 pack-year smoking history. He has been exposed to tobacco smoke. He has never used smokeless tobacco. He reports that he does not currently use alcohol after a past usage of about 12.0 standard drinks of alcohol per week. He reports that he does not use drugs. Medical:  has a past medical history of Allergy, Calculus of kidney (02/04/2015), COPD (chronic obstructive pulmonary disease) (HCC), Diabetes mellitus without complication (HCC), Hypertension, and Neuropathy. Surgical: Darren Allen  has a past surgical history that includes Appendectomy; Incision and  drainage perirectal abscess (N/A, 02/04/2015); Rectal exam under anesthesia (02/04/2015); ORIF ankle fracture (Left, 03/23/2017); Syndesmosis repair (Left, 03/23/2017); Colonoscopy with propofol (N/A, 12/31/2021); Esophagogastroduodenoscopy (N/A, 12/31/2021); Kidney stone surgery (Right); lung mass removal (N/A); Xi robotic assisted paraesophageal hernia repair (N/A, 01/20/2022); Insertion of mesh (01/20/2022); and Umbilical hernia repair (01/20/2022). Family: family history includes Alcohol abuse in his father; Cancer in his father; Cancer (age of onset: 8) in his brother; Cancer (age of onset: 41) in his mother; Diabetes in his brother; Heart disease in his brother and father.  Laboratory Chemistry Profile   Renal Lab Results  Component Value Date   BUN 12 02/10/2023   CREATININE 1.07 02/10/2023   BCR 11 02/10/2023   GFRAA 59 (L) 12/07/2019   GFRNONAA >60 02/15/2022    Hepatic Lab Results  Component Value Date   AST 50 (H) 02/10/2023   ALT 46 (H) 02/10/2023   ALBUMIN 4.4 02/10/2023   ALKPHOS 60 02/10/2023   AMYLASE 93 02/04/2022   LIPASE 115 (H) 02/04/2022    Electrolytes Lab Results  Component Value Date   NA 138 02/10/2023   K 3.7 02/10/2023   CL 98 02/10/2023   CALCIUM 9.6 02/10/2023   MG 1.8 01/25/2022   PHOS 2.3 (L) 01/23/2022    Bone Lab Results  Component Value Date   25OHVITD1 44 09/08/2021   25OHVITD2 <1.0 09/08/2021   25OHVITD3 43 09/08/2021   TESTOSTERONE 406 10/12/2022    Inflammation (CRP: Acute Phase) (ESR: Chronic Phase) Lab Results  Component Value Date   CRP 1.8 (H) 09/08/2021   ESRSEDRATE 17 09/08/2021   LATICACIDVEN 1.6 02/03/2022         Note: Above Lab results reviewed.  Recent Imaging Review  DG PAIN CLINIC C-ARM 1-60 MIN NO REPORT Fluoro was used, but no Radiologist interpretation will be provided.  Please refer to "NOTES" tab for provider progress note. Note: Reviewed        Physical Exam  General appearance: Well nourished, well  developed, and well hydrated. In no apparent acute distress Mental status: Alert, oriented x 3 (person, place, & time)       Respiratory: No evidence of acute respiratory distress Eyes: PERLA Vitals: BP (!) 151/88   Pulse (!) 104   Temp 98.1 F (36.7 C) (Temporal)   Resp 18   Ht 6\' 2"  (1.88 m)   Wt 250 lb (113.4 kg)   SpO2 98%   BMI 32.10 kg/m  BMI: Estimated body mass index is 32.1 kg/m as calculated from the following:   Height as of this encounter: 6\' 2"  (1.88 m).   Weight as of this encounter: 250 lb (113.4 kg). Ideal: Ideal body weight: 82.2 kg (181 lb 3.5 oz) Adjusted ideal body weight: 94.7 kg (208 lb 11.7 oz)  Physical Exam   MUSCULOSKELETAL: Hyperextension and rotation of the lumbar spine elicited pain on both the right and left sides, indicating facet joint involvement. NEUROLOGICAL: No evidence of nerve root compression as indicated by absence of pain radiating to the legs during lumbar spine hyperextension and rotation.     Assessment   Diagnosis Status  1. Chronic low back pain (1ry area of Pain) (Bilateral) (R>L) w/o sciatica   2. Chronic feet pain (2ry area of Pain) (Bilateral)   3. DDD (degenerative disc disease), lumbosacral   4. Lumbar facet joint syndrome   5. Chronic pain syndrome   6. Pharmacologic therapy   7. Chronic use of opiate for therapeutic purpose   8. Encounter for medication management   9. Encounter for chronic pain management   10. Lumbar facet joint pain   11. Spondylosis without myelopathy or radiculopathy, lumbosacral region   12. Abnormal MRI, lumbar spine (03/11/2020)    Controlled Controlled Controlled   Updated Problems: Problem  Lumbar Facet Joint Pain  Spondylosis Without Myelopathy Or Radiculopathy, Lumbosacral Region    Plan of Care  Problem-specific:  Assessment and Plan    Chronic Low Back Pain They report increased pain in the center and left lower back, radiating to the left buttocks. A previous right-sided L5  lumbar epidural steroid injection on October 10th provided relief for a few weeks. Physical examination confirmed pain from lumbar facets without radicular symptoms, indicating facet joint involvement. We explained that lumbar facet injections typically provide relief for 3-4 months, but this time relief lasted only a few weeks. We discussed performing lumbar facet injections bilaterally if necessary, with the option to cancel one side if pain is unilateral on the day of the procedure. We explained the need for sedation, a driver, and fasting for at least eight hours prior to the procedure. We discussed using a pain diary to track symptoms post-procedure. We will schedule bilateral lumbar facet injections and perform mapping on the day of the procedure to identify affected levels. Sedation will be administered during the procedure, and they must ensure a driver is available on the day of the procedure. They are instructed to fast for at least eight hours prior to the procedure and provide  a pain diary to record symptoms post-procedure. We will follow up two weeks post-procedure to assess outcomes and determine further steps.  Consideration of Spinal Implants for Lower Extremity Pain They expressed interest in spinal implants for lower extremity pain, particularly in the feet. We explained that implants are typically placed in the lumbar spine and are effective for lower extremity pain. We discussed the trial period to assess efficacy before permanent implantation and emphasized that the trial is as effective as the permanent implant in terms of benefit. We will discuss the potential trial of spinal implants if lumbar facet injections do not provide sufficient relief.  Follow-up We will schedule a follow-up appointment two weeks post-procedure.     Darren Allen has a current medication list which includes the following long-term medication(s): ferrous sulfate, fluticasone, hydrochlorothiazide, [START  ON 02/21/2023] hydrocodone-acetaminophen, [START ON 03/23/2023] hydrocodone-acetaminophen, [START ON 04/22/2023] hydrocodone-acetaminophen, humulin 70/30 kwikpen, metformin, montelukast, rosuvastatin, tadalafil, and testosterone cypionate.  Pharmacotherapy (Medications Ordered): Meds ordered this encounter  Medications   HYDROcodone-acetaminophen (NORCO/VICODIN) 5-325 MG tablet    Sig: Take 1 tablet by mouth 2 (two) times daily as needed. Must last 30 days.    Dispense:  60 tablet    Refill:  0    DO NOT: delete (not duplicate); no partial-fill (will deny script to complete), no refill request (F/U required). DISPENSE: 1 day early if closed on fill date. WARN: No CNS-depressants within 8 hrs of med.   HYDROcodone-acetaminophen (NORCO/VICODIN) 5-325 MG tablet    Sig: Take 1 tablet by mouth 2 (two) times daily as needed. Must last 30 days.    Dispense:  60 tablet    Refill:  0    DO NOT: delete (not duplicate); no partial-fill (will deny script to complete), no refill request (F/U required). DISPENSE: 1 day early if closed on fill date. WARN: No CNS-depressants within 8 hrs of med.   HYDROcodone-acetaminophen (NORCO/VICODIN) 5-325 MG tablet    Sig: Take 1 tablet by mouth 2 (two) times daily as needed. Must last 30 days.    Dispense:  60 tablet    Refill:  0    DO NOT: delete (not duplicate); no partial-fill (will deny script to complete), no refill request (F/U required). DISPENSE: 1 day early if closed on fill date. WARN: No CNS-depressants within 8 hrs of med.   naloxone (NARCAN) nasal spray 4 mg/0.1 mL    Sig: Place 1 spray into the nose as needed for up to 365 doses (for opioid-induced respiratory depresssion). In case of emergency (overdose), spray once into each nostril. If no response within 3 minutes, repeat application and call 911.    Dispense:  1 each    Refill:  0    Instruct patient in proper use of device.   Orders:  Orders Placed This Encounter  Procedures   LUMBAR FACET(MEDIAL  BRANCH NERVE BLOCK) MBNB    Diagnosis: Lumbar Facet Syndrome (M47.816); Lumbosacral Facet Syndrome (M47.817); Lumbar Facet Joint Pain (M54.59) Medical Necessity Statement: 1.Severe chronic axial low back pain causing functional impairment documented by ongoing pain scale assessments. 2.Pain present for longer than 3 months (Chronic) documented to have failed noninvasive conservative therapies. 3.Absence of untreated radiculopathy. 4.There is no radiological evidence of untreated fractures, tumor, infection, or deformity.  Physical Examination Findings: Positive Kemp Maneuver: (Y)  Positive Lumbar Hyperextension-Rotation provocative test: (Y)    Standing Status:   Future    Standing Expiration Date:   05/18/2023    Scheduling Instructions:  Procedure: Lumbar facet Block     Type: Medial Branch Block     Side: Bilateral     Purpose: Diagnostic/Therapeutic     Level(s): L3-4, L4-5, L5-S1, and TBD by Fluoroscopic Mapping Facets (L2, L3, L4, L5, S1, and TBD Medial Branch)     Sedation: With Sedation.     Timeframe: ASAP    Order Specific Question:   Where will this procedure be performed?    Answer:   [facility]   Follow-up plan:   Return for (Clinic): (B) L-FCT Blk #1.      Interventional Therapies  Risk Factors  Considerations:   WNL   Planned  Pending:   Therapeutic right L5-S1 LESI #1    Under consideration:   Diagnostic bilateral lumbar facet MBB #1  Diagnostic/therapeutic left L5 & S1 TFESI #1  Possible spinal cord stimulator trial  Therapeutic left L5-S1 percutaneous discectomy with "[employer] Dekompressor" system    Completed:   Diagnostic midline to left caudal ESI x1 (04/02/2022) (100/100/100/LBP:100/LEP:100)  Therapeutic left L5-S1 LESI x3 (08/27/2022) (1st:100/100/100/LBP:85  LEP:100) (2nd: 01[birth year]13/59/LBP:60LLEP:100)  Therapeutic right L5-S1 LESI x1 (12/24/2022) (100/85/100/LBP:100)  Therapeutic bilateral Qutenza neurolytic treatment x1 (12/23/2021)   (09/08/2021 & 10/29/2021) referral to physical therapy for evaluation and treatment of low back pain.   Completed by other providers:   EMG/PNCV of lower extremity (02/20/2020) by Dr. Cristopher Peru (generalized sensorimotor peripheral neuropathy; superimposed left S1 radiculopathy)   Therapeutic  Palliative (PRN) options:   None established      Recent Visits Date Type Provider Dept  01/07/23 Office Visit Delano Metz, MD [facility]  12/24/22 Procedure visit Delano Metz, MD [facility]  12/23/22 Office Visit Delano Metz, MD [facility]  Showing recent visits within past 90 days and meeting all other requirements Today's Visits Date Type Provider Dept  02/17/23 Office Visit Delano Metz, MD [facility]  Showing today's visits and meeting all other requirements Future Appointments Date Type Provider Dept  03/02/23 Appointment Delano Metz, MD [facility]  Showing future appointments within next 90 days and meeting all other requirements  I discussed the assessment and treatment plan with the patient. The patient was provided an opportunity to ask questions and all were answered. The patient agreed with the plan and demonstrated an understanding of the instructions.  Patient advised to call back or seek an in-person evaluation if the symptoms or condition worsens.  Duration of encounter: 30 minutes.  Total time on encounter, as per AMA guidelines included both the face-to-face and non-face-to-face time personally spent by the physician and/or other qualified health care professional(s) on the day of the encounter (includes time in activities that require the physician or other qualified health care professional and does not include time in activities normally performed by clinical staff). Physician's time may include the following activities when performed: Preparing to see the patient (e.g., pre-charting  review of records, searching for previously ordered imaging, lab work, and nerve conduction tests) Review of prior analgesic pharmacotherapies. Reviewing PMP Interpreting ordered tests (e.g., lab work, imaging, nerve conduction tests) Performing post-procedure evaluations, including interpretation of diagnostic procedures Obtaining and/or reviewing separately obtained history Performing a medically appropriate examination and/or evaluation Counseling and educating the patient/family/caregiver Ordering medications, tests, or procedures Referring and communicating with other health care professionals (when not separately reported) Documenting clinical information in the electronic or other health record Independently interpreting results (not separately reported) and communicating results to the patient/ family/caregiver Care coordination (not separately  reported)  Note by: Oswaldo Done, MD Date: 02/17/2023; Time: 12:46 PM

## 2023-02-17 NOTE — Patient Instructions (Addendum)
______________________________________________________________________    Procedure instructions  Stop blood-thinners  Do not eat or drink fluids (other than water) for 6 hours before your procedure  No water for 2 hours before your procedure  Take your blood pressure medicine with a sip of water  Arrive 30 minutes before your appointment  If sedation is planned, bring suitable driver. Pennie Banter, Benedetto Goad, & public transportation are NOT APPROVED)  Carefully read the "Preparing for your procedure" detailed instructions  If you have questions call us at [phone]  ______________________________________________________________________      ______________________________________________________________________    Preparing for your procedure  Appointments: If you think you may not be able to keep your appointment, call 24-48 hours in advance to cancel. We need time to make it available to others.  During your procedure appointment there will be: No Prescription Refills. No disability issues to discussed. No medication changes or discussions.  Instructions: Food intake: Avoid eating anything solid for at least 8 hours prior to your procedure. Clear liquid intake: You may take clear liquids such as water up to 2 hours prior to your procedure. (No carbonated drinks. No soda.) Transportation: Unless otherwise stated by your physician, bring a driver. (Driver cannot be a Market researcher, Pharmacist, community, or any other form of public transportation.) Morning Medicines: Except for blood thinners, take all of your other morning medications with a sip of water. Make sure to take your heart and blood pressure medicines. If your blood pressure's lower number is above 100, the case will be rescheduled. Blood thinners: Make sure to stop your blood thinners as instructed.  If you take a blood thinner, but were not instructed to stop it, call our office [phone] and ask to talk to a nurse. Not stopping a blood  thinner prior to certain procedures could lead to serious complications. Diabetics on insulin: Notify the staff so that you can be scheduled 1st case in the morning. If your diabetes requires high dose insulin, take only  of your normal insulin dose the morning of the procedure and notify the staff that you have done so. Preventing infections: Shower with an antibacterial soap the morning of your procedure.  Build-up your immune system: Take 1000 mg of Vitamin C with every meal (3 times a day) the day prior to your procedure. Antibiotics: Inform the nursing staff if you are taking any antibiotics or if you have any conditions that may require antibiotics prior to procedures. (Example: recent joint implants)   Pregnancy: If you are pregnant make sure to notify the nursing staff. Not doing so may result in injury to the fetus, including death.  Sickness: If you have a cold, fever, or any active infections, call and cancel or reschedule your procedure. Receiving steroids while having an infection may result in complications. Arrival: You must be in the facility at least 30 minutes prior to your scheduled procedure. Tardiness: Your scheduled time is also the cutoff time. If you do not arrive at least 15 minutes prior to your procedure, you will be rescheduled.  Children: Do not bring any children with you. Make arrangements to keep them home. Dress appropriately: There is always a possibility that your clothing may get soiled. Avoid long dresses. Valuables: Do not bring any jewelry or valuables.  Reasons to call and reschedule or cancel your procedure: (Following these recommendations will minimize the risk of a serious complication.) Surgeries: Avoid having procedures within 2 weeks of any surgery. (Avoid for 2 weeks before or after any surgery). Flu Shots:  Avoid having procedures within 2 weeks of a flu shots or . (Avoid for 2 weeks before or after immunizations). Barium: Avoid having a procedure  within 7-10 days after having had a radiological study involving the use of radiological contrast. (Myelograms, Barium swallow or enema study). Heart attacks: Avoid any elective procedures or surgeries for the initial 6 months after a "Myocardial Infarction" (Heart Attack). Blood thinners: It is imperative that you stop these medications before procedures. Let us know if you if you take any blood thinner.  Infection: Avoid procedures during or within two weeks of an infection (including chest colds or gastrointestinal problems). Symptoms associated with infections include: Localized redness, fever, chills, night sweats or profuse sweating, burning sensation when voiding, cough, congestion, stuffiness, runny nose, sore throat, diarrhea, nausea, vomiting, cold or Flu symptoms, recent or current infections. It is specially important if the infection is over the area that we intend to treat. Heart and lung problems: Symptoms that may suggest an active cardiopulmonary problem include: cough, chest pain, breathing difficulties or shortness of breath, dizziness, ankle swelling, uncontrolled high or unusually low blood pressure, and/or palpitations. If you are experiencing any of these symptoms, cancel your procedure and contact your primary care physician for an evaluation.  Remember:  Regular Business hours are:  Monday to Thursday 8:00 AM to 4:00 PM  Provider's Schedule: Delano Metz, MD:  Procedure days: Tuesday and Thursday 7:30 AM to 4:00 PM  Edward Jolly, MD:  Procedure days: Monday and Wednesday 7:30 AM to 4:00 PM Last  Updated: 11/03/2022 ______________________________________________________________________      ______________________________________________________________________    General Risks and Possible Complications  Patient Responsibilities: It is important that you read this as it is part of your informed consent. It is our duty to inform you of the risks and possible  complications associated with treatments offered to you. It is your responsibility as a patient to read this and to ask questions about anything that is not clear or that you believe was not covered in this document.  Patient's Rights: You have the right to refuse treatment. You also have the right to change your mind, even after initially having agreed to have the treatment done. However, under this last option, if you wait until the last second to change your mind, you may be charged for the materials used up to that point.  Introduction: Medicine is not an Visual merchandiser. Everything in Medicine, including the lack of treatment(s), carries the potential for danger, harm, or loss (which is by definition: Risk). In Medicine, a complication is a secondary problem, condition, or disease that can aggravate an already existing one. All treatments carry the risk of possible complications. The fact that a side effects or complications occurs, does not imply that the treatment was conducted incorrectly. It must be clearly understood that these can happen even when everything is done following the highest safety standards.  No treatment: You can choose not to proceed with the proposed treatment alternative. The "PRO(s)" would include: avoiding the risk of complications associated with the therapy. The "CON(s)" would include: not getting any of the treatment benefits. These benefits fall under one of three categories: diagnostic; therapeutic; and/or palliative. Diagnostic benefits include: getting information which can ultimately lead to improvement of the disease or symptom(s). Therapeutic benefits are those associated with the successful treatment of the disease. Finally, palliative benefits are those related to the decrease of the primary symptoms, without necessarily curing the condition (example: decreasing the pain from a flare-up  of a chronic condition, such as incurable terminal cancer).  General Risks and  Complications: These are associated to most interventional treatments. They can occur alone, or in combination. They fall under one of the following six (6) categories: no benefit or worsening of symptoms; bleeding; infection; nerve damage; allergic reactions; and/or death. No benefits or worsening of symptoms: In Medicine there are no guarantees, only probabilities. No healthcare provider can ever guarantee that a medical treatment will work, they can only state the probability that it may. Furthermore, there is always the possibility that the condition may worsen, either directly, or indirectly, as a consequence of the treatment. Bleeding: This is more common if the patient is taking a blood thinner, either prescription or over the counter (example: Goody Powders, Fish oil, Aspirin, Garlic, etc.), or if suffering a condition associated with impaired coagulation (example: Hemophilia, cirrhosis of the liver, low platelet counts, etc.). However, even if you do not have one on these, it can still happen. If you have any of these conditions, or take one of these drugs, make sure to notify your treating physician. Infection: This is more common in patients with a compromised immune system, either due to disease (example: diabetes, cancer, human immunodeficiency virus [HIV], etc.), or due to medications or treatments (example: therapies used to treat cancer and rheumatological diseases). However, even if you do not have one on these, it can still happen. If you have any of these conditions, or take one of these drugs, make sure to notify your treating physician. Nerve Damage: This is more common when the treatment is an invasive one, but it can also happen with the use of medications, such as those used in the treatment of cancer. The damage can occur to small secondary nerves, or to large primary ones, such as those in the spinal cord and brain. This damage may be temporary or permanent and it may lead to  impairments that can range from temporary numbness to permanent paralysis and/or brain death. Allergic Reactions: Any time a substance or material comes in contact with our body, there is the possibility of an allergic reaction. These can range from a mild skin rash (contact dermatitis) to a severe systemic reaction (anaphylactic reaction), which can result in death. Death: In general, any medical intervention can result in death, most of the time due to an unforeseen complication. ______________________________________________________________________      ______________________________________________________________________    Opioid Pain Medication Update  To: All patients taking opioid pain medications. (I.e.: hydrocodone, hydromorphone, oxycodone, oxymorphone, morphine, codeine, methadone, tapentadol, tramadol, buprenorphine, fentanyl, etc.)  Re: Updated review of side effects and adverse reactions of opioid analgesics, as well as new information about long term effects of this class of medications.  Direct risks of long-term opioid therapy are not limited to opioid addiction and overdose. Potential medical risks include serious fractures, breathing problems during sleep, hyperalgesia, immunosuppression, chronic constipation, bowel obstruction, myocardial infarction, and tooth decay secondary to xerostomia.  Unpredictable adverse effects that can occur even if you take your medication correctly: Cognitive impairment, respiratory depression, and death. Most people think that if they take their medication "correctly", and "as instructed", that they will be safe. Nothing could be farther from the truth. In reality, a significant amount of recorded deaths associated with the use of opioids has occurred in individuals that had taken the medication for a long time, and were taking their medication correctly. The following are examples of how this can happen: Patient taking his/her medication for a  long time, as instructed, without any side effects, is given a certain antibiotic or another unrelated medication, which in turn triggers a "Drug-to-drug interaction" leading to disorientation, cognitive impairment, impaired reflexes, respiratory depression or an untoward event leading to serious bodily harm or injury, including death.  Patient taking his/her medication for a long time, as instructed, without any side effects, develops an acute impairment of liver and/or kidney function. This will lead to a rapid inability of the body to breakdown and eliminate their pain medication, which will result in effects similar to an "overdose", but with the same medicine and dose that they had always taken. This again may lead to disorientation, cognitive impairment, impaired reflexes, respiratory depression or an untoward event leading to serious bodily harm or injury, including death.  A similar problem will occur with patients as they grow older and their liver and kidney function begins to decrease as part of the aging process.  Background information: Historically, the original case for using long-term opioid therapy to treat chronic noncancer pain was based on safety assumptions that subsequent experience has called into question. In 1996, the American Pain Society and the American Academy of Pain Medicine issued a consensus statement supporting long-term opioid therapy. This statement acknowledged the dangers of opioid prescribing but concluded that the risk for addiction was low; respiratory depression induced by opioids was short-lived, occurred mainly in opioid-naive patients, and was antagonized by pain; tolerance was not a common problem; and efforts to control diversion should not constrain opioid prescribing. This has now proven to be wrong. Experience regarding the risks for opioid addiction, misuse, and overdose in community practice has failed to support these assumptions.  According to the Centers  for Disease Control and Prevention, fatal overdoses involving opioid analgesics have increased sharply over the past decade. Currently, more than 96,700 people die from drug overdoses every year. Opioids are a factor in 7 out of every 10 overdose deaths. Deaths from drug overdose have surpassed motor vehicle accidents as the leading cause of death for individuals between the ages of 9 and 50.  Clinical data suggest that neuroendocrine dysfunction may be very common in both men and women, potentially causing hypogonadism, erectile dysfunction, infertility, decreased libido, osteoporosis, and depression. Recent studies linked higher opioid dose to increased opioid-related mortality. Controlled observational studies reported that long-term opioid therapy may be associated with increased risk for cardiovascular events. Subsequent meta-analysis concluded that the safety of long-term opioid therapy in elderly patients has not been proven.   Side Effects and adverse reactions: Common side effects: Drowsiness (sedation). Dizziness. Nausea and vomiting. Constipation. Physical dependence -- Dependence often manifests with withdrawal symptoms when opioids are discontinued or decreased. Tolerance -- As you take repeated doses of opioids, you require increased medication to experience the same effect of pain relief. Respiratory depression -- This can occur in healthy people, especially with higher doses. However, people with COPD, asthma or other lung conditions may be even more susceptible to fatal respiratory impairment.  Uncommon side effects: An increased sensitivity to feeling pain and extreme response to pain (hyperalgesia). Chronic use of opioids can lead to this. Delayed gastric emptying (the process by which the contents of your stomach are moved into your small intestine). Muscle rigidity. Immune system and hormonal dysfunction. Quick, involuntary muscle jerks (myoclonus). Arrhythmia. Itchy skin  (pruritus). Dry mouth (xerostomia).  Long-term side effects: Chronic constipation. Sleep-disordered breathing (SDB). Increased risk of bone fractures. Hypothalamic-pituitary-adrenal dysregulation. Increased risk of overdose.  RISKS: Respiratory depression and death: Opioids  increase the risk of respiratory depression and death.  Drug-to-drug interactions: Opioids are relatively contraindicated in combination with benzodiazepines, sleep inducers, and other central nervous system depressants. Other classes of medications (i.e.: certain antibiotics and even over-the-counter medications) may also trigger or induce respiratory depression in some patients.  Medical conditions: Patients with pre-existing respiratory problems are at higher risk of respiratory failure and/or depression when in combination with opioid analgesics. Opioids are relatively contraindicated in some medical conditions such as central sleep apnea.   Fractures and Falls:  Opioids increase the risk and incidence of falls. This is of particular importance in elderly patients.  Endocrine System:  Long-term administration is associated with endocrine abnormalities (endocrinopathies). (Also known as Opioid-induced Endocrinopathy) Influences on both the hypothalamic-pituitary-adrenal axis?and the hypothalamic-pituitary-gonadal axis have been demonstrated with consequent hypogonadism and adrenal insufficiency in both sexes. Hypogonadism and decreased levels of dehydroepiandrosterone sulfate have been reported in men and women. Endocrine effects include: Amenorrhoea in women (abnormal absence of menstruation) Reduced libido in both sexes Decreased sexual function Erectile dysfunction in men Hypogonadisms (decreased testicular function with shrinkage of testicles) Infertility Depression and fatigue Loss of muscle mass Anxiety Depression Immune suppression Hyperalgesia Weight gain Anemia Osteoporosis Patients (particularly  women of childbearing age) should avoid opioids. There is insufficient evidence to recommend routine monitoring of asymptomatic patients taking opioids in the long-term for hormonal deficiencies.  Immune System: Human studies have demonstrated that opioids have an immunomodulating effect. These effects are mediated via opioid receptors both on immune effector cells and in the central nervous system. Opioids have been demonstrated to have adverse effects on antimicrobial response and anti-tumour surveillance. Buprenorphine has been demonstrated to have no impact on immune function.  Opioid Induced Hyperalgesia: Human studies have demonstrated that prolonged use of opioids can lead to a state of abnormal pain sensitivity, sometimes called opioid induced hyperalgesia (OIH). Opioid induced hyperalgesia is not usually seen in the absence of tolerance to opioid analgesia. Clinically, hyperalgesia may be diagnosed if the patient on long-term opioid therapy presents with increased pain. This might be qualitatively and anatomically distinct from pain related to disease progression or to breakthrough pain resulting from development of opioid tolerance. Pain associated with hyperalgesia tends to be more diffuse than the pre-existing pain and less defined in quality. Management of opioid induced hyperalgesia requires opioid dose reduction.  Cancer: Chronic opioid therapy has been associated with an increased risk of cancer among noncancer patients with chronic pain. This association was more evident in chronic strong opioid users. Chronic opioid consumption causes significant pathological changes in the small intestine and colon. Epidemiological studies have found that there is a link between opium dependence and initiation of gastrointestinal cancers. Cancer is the second leading cause of death after cardiovascular disease. Chronic use of opioids can cause multiple conditions such as GERD, immunosuppression and  renal damage as well as carcinogenic effects, which are associated with the incidence of cancers.   Mortality: Long-term opioid use has been associated with increased mortality among patients with chronic non-cancer pain (CNCP).  Prescription of long-acting opioids for chronic noncancer pain was associated with a significantly increased risk of all-cause mortality, including deaths from causes other than overdose.  Reference: Von Korff M, Kolodny A, Deyo RA, Chou R. Long-term opioid therapy reconsidered. Ann Intern Med. 2011 Sep 6;155(5):325-8. doi: 10.7326/[id number]. PMID: 24580998; PMCID: [id number]. Randon Goldsmith, Hayward RA, Dunn KM, [place] KP. Risk of adverse events in patients prescribed long-term opioids: A cohort study in the [place] [facility]  [employer]. Eur J Pain. 2019 May;23(5):908-922. doi: 10.1002/ejp.1357. Epub 2019 Jan 31. PMID: 40981191. Colameco S, Coren JS, [place]o CA. Continuous opioid treatment for chronic noncancer pain: a time for moderation in prescribing. Postgrad Med. 2009 Jul;121(4):61-6. doi: 10.3810/pgm.2009.07.2032. PMID: 47829562. William Hamburger RN, [place], Blazina I, Cristopher [place], Bougatsos C, Deyo RA. The effectiveness and risks of long-term opioid therapy for chronic pain: a systematic review for a [employer] to [employer]. Ann Intern Med. 2015 Feb 17;162(4):276-86. doi: 10.7326/M14-2559. PMID: 13086578. Caryl Bis [facility], [place] DM. NCHS Data Brief No. 22. [place]: Centers for Disease Control and Prevention; 2009. Sep, Increase in Fatal Poisonings Involving Opioid Analgesics in the [place], 1999-2006. Song IA, Choi HR, Oh TK. Long-term opioid use and mortality in patients with chronic non-cancer pain: Ten-year follow-up study in [place] from 2010 through 2019. EClinicalMedicine. 2022 Jul 18;51:101558. doi: 10.1016/j.eclinm.2022.469629. PMID: 52841324; PMCID:  [id number]. Huser, W., Schubert, T., Vogelmann, T. et al. All-cause mortality in patients with long-term opioid therapy compared with non-opioid analgesics for chronic non-cancer pain: a database study. BMC Med 18, 162 (2020). http://lester.info/ Rashidian H, Karie Kirks, Malekzadeh R, Haghdoost AA. An Ecological Study of the Association between Opiate Use and Incidence of Cancers. Addict Health. 2016 Fall;8(4):252-260. PMID: 64403474; PMCID: [id number].  Our Goal: Our goal is to control your pain with means other than the use of opioid pain medications.  Our Recommendation: Talk to your physician about coming off of these medications. We can assist you with the tapering down and stopping these medicines. Based on the new information, even if you cannot completely stop the medication, a decrease in the dose may be associated with a lesser risk. Ask for other means of controlling the pain. Decrease or eliminate those factors that significantly contribute to your pain such as smoking, obesity, and a diet heavily tilted towards "inflammatory" nutrients.  Last Updated: 09/21/2022   ______________________________________________________________________       ______________________________________________________________________    National Pain Medication Shortage  The [place] is experiencing worsening drug shortages. These have had a negative widespread effect on patient care and treatment. Not expected to improve any time soon. Predicted to last past 2029.   Drug shortage list (generic names) Oxycodone IR Oxycodone/APAP Oxymorphone IR Hydromorphone Hydrocodone/APAP Morphine  Where is the problem?  Manufacturing and supply level.  Will this shortage affect you?  Only if you take any of the above pain medications.  How? You may be unable to fill your prescription.  Your pharmacist may offer a "partial fill" of your prescription. (Warning: Do not  accept partial fills.) Prescriptions partially filled cannot be transferred to another pharmacy. Read our Medication Rules and Regulation. Depending on how much medicine you are dependent on, you may experience withdrawals when unable to get the medication.  Recommendations: Consider ending your dependence on opioid pain medications. Ask your pain specialist to assist you with the process. Consider switching to a medication currently not in shortage, such as Buprenorphine. Talk to your pain specialist about this option. Consider decreasing your pain medication requirements by managing tolerance thru "Drug Holidays". This may help minimize withdrawals, should you run out of medicine. Control your pain thru the use of non-pharmacological interventional therapies.   Your prescriber: Prescribers cannot be blamed for shortages. Medication manufacturing and supply issues cannot be fixed by the prescriber.   NOTE: The prescriber is not responsible for supplying the medication, or solving supply issues. Work with Engineer, production  to solve it. The patient is responsible for the decision to take or continue taking the medication and for identifying and securing a legal supply source. By law, supplying the medication is the job and responsibility of the pharmacy. The prescriber is responsible for the evaluation, monitoring, and prescribing of these medications.   Prescribers will NOT: Re-issue prescriptions that have been partially filled. Re-issue prescriptions already sent to a pharmacy.  Re-send prescriptions to a different pharmacy because yours did not have your medication. Ask pharmacist to order more medicine or transfer the prescription to another pharmacy. (Read below.)  New 2023 regulation: "November 14, 2021 Revised Regulation Allows DEA-Registered Pharmacies to Transfer Electronic Prescriptions at a Patient's Request DEA Headquarters Division - Public Information Office Patients now have the  ability to request their electronic prescription be transferred to another pharmacy without having to go back to their practitioner to initiate the request. This revised regulation went into effect on Monday, November 10, 2021.     At a patient's request, a DEA-registered retail pharmacy can now transfer an electronic prescription for a controlled substance (schedules II-V) to another DEA-registered retail pharmacy. Prior to this change, patients would have to go through their practitioner to cancel their prescription and have it re-issued to a different pharmacy. The process was taxing and time consuming for both patients and practitioners.    The Drug Enforcement Administration [facility]) published its intent to revise the process for transferring electronic prescriptions on February 02, 2020.  The final rule was published in the federal register on October 09, 2021 and went into effect 30 days later.  Under the final rule, a prescription can only be transferred once between pharmacies, and only if allowed under existing state or other applicable law. The prescription must remain in its electronic form; may not be altered in any way; and the transfer must be communicated directly between two licensed pharmacists. It's important to note, any authorized refills transfer with the original prescription, which means the entire prescription will be filled at the same pharmacy".  Reference: [email or web address].is [facility] website announcement)  [email or web address] [employer])   [employer] / Vol. 88, No. 143 / Thursday, October 09, 2021 / Rules and Regulations DEPARTMENT OF JUSTICE  Drug Enforcement Administration  21 CFR Part 1306  [Docket No. [id number]]  RIN [id number] Transfer of Electronic Prescriptions for Schedules II-V Controlled Substances  Between Pharmacies for Initial Filling  ______________________________________________________________________       ______________________________________________________________________    Transfer of Pain Medication between Pharmacies  Re: 2023 DEA Clarification on existing regulation  Published on DEA Website: November 14, 2021  Title: Revised Regulation Allows DEA-Registered Pharmacies to Electrical engineer Prescriptions at a Patient's Request DEA Headquarters Division - [employer]  "Patients now have the ability to request their electronic prescription be transferred to another pharmacy without having to go back to their practitioner to initiate the request. This revised regulation went into effect on Monday, November 10, 2021.     At a patient's request, a DEA-registered retail pharmacy can now transfer an electronic prescription for a controlled substance (schedules II-V) to another DEA-registered retail pharmacy. Prior to this change, patients would have to go through their practitioner to cancel their prescription and have it re-issued to a different pharmacy. The process was taxing and time consuming for both patients and practitioners.    The Drug Enforcement Administration [facility]) published its intent to revise the process for transferring electronic prescriptions on February 02, 2020.  The final rule was published in the federal register on October 09, 2021 and went into effect 30 days later.  Under the final rule, a prescription can only be transferred once between pharmacies, and only if allowed under existing state or other applicable law. The prescription must remain in its electronic form; may not be altered in any way; and the transfer must be communicated directly between two licensed pharmacists. It's important to note, any authorized refills transfer with the original prescription, which means the entire prescription will be filled at the same pharmacy."     REFERENCES: 1. DEA website announcement [email or web address]  2. Department of Justice website  [email or web address]  3. DEPARTMENT OF JUSTICE Drug Enforcement Administration 21 CFR Part 1306 [Docket No. [id number]] RIN [id number] "Transfer of Electronic Prescriptions for Schedules II-V Controlled Substances Between Pharmacies for Initial Filling"  ______________________________________________________________________       ______________________________________________________________________    Medication Rules  Purpose: To inform patients, and their family members, of our medication rules and regulations.  Applies to: All patients receiving prescriptions from our practice (written or electronic).  Pharmacy of record: This is the pharmacy where your electronic prescriptions will be sent. Make sure we have the correct one.  Electronic prescriptions: In compliance with the [facility] Strengthen Opioid Misuse Prevention (STOP) Act of 2017 (Session Conni Elliot [phone]), effective March 16, 2018, all controlled substances must be electronically prescribed. Written prescriptions, faxing, or calling prescriptions to a pharmacy will no longer be done.  Prescription refills: These will be provided only during in-person appointments. No medications will be renewed without a "face-to-face" evaluation with your provider. Applies to all prescriptions.  NOTE: The following applies primarily to controlled substances (Opioid* Pain Medications).   Type of encounter (visit): For patients receiving controlled substances, face-to-face visits are required. (Not an option and not up to the patient.)  Patient's Responsibilities: Pain Pills: Bring all pain pills to every appointment (except for procedure appointments). Pill counts are required.  Pill Bottles:  Bring pills in original pharmacy bottle. Bring bottle, even if empty. Always bring the bottle of the most recent fill.  Medication refills: You are responsible for knowing and keeping track of what medications you are taking and when is it that you will need a refill. The day before your appointment: write a list of all prescriptions that need to be refilled. The day of the appointment: give the list to the admitting nurse. Prescriptions will be written only during appointments. No prescriptions will be written on procedure days. If you forget a medication: it will not be "Called in", "Faxed", or "electronically sent". You will need to get another appointment to get these prescribed. No early refills. Do not call asking to have your prescription filled early. Partial  or short prescriptions: Occasionally your pharmacy may not have enough pills to fill your prescription.  NEVER ACCEPT a partial fill or a prescription that is short of the total amount of pills that you were prescribed.  With controlled substances the law allows 72 hours for the pharmacy to complete the prescription.  If the prescription is not completed within 72 hours, the pharmacist will require a new prescription to be written. This means that you will be short on your medicine and we WILL NOT send another prescription to complete your original prescription.  Instead, request the pharmacy to send a carrier to a nearby branch to get enough medication to provide you with your full prescription. Prescription Accuracy: You are responsible for carefully inspecting your prescriptions  before leaving our office. Have the discharge nurse carefully go over each prescription with you, before taking them home. Make sure that your name is accurately spelled, that your address is correct. Check the name and dose of your medication to make sure it is accurate. Check the number of pills, and the written instructions to make sure they are clear and accurate.  Make sure that you are given enough medication to last until your next medication refill appointment. Taking Medication: Take medication as prescribed. When it comes to controlled substances, taking less pills or less frequently than prescribed is permitted and encouraged. Never take more pills than instructed. Never take the medication more frequently than prescribed.  Inform other Doctors: Always inform, all of your healthcare providers, of all the medications you take. Pain Medication from other Providers: You are not allowed to accept any additional pain medication from any other Doctor or Healthcare provider. There are two exceptions to this rule. (see below) In the event that you require additional pain medication, you are responsible for notifying us, as stated below. Cough Medicine: Often these contain an opioid, such as codeine or hydrocodone. Never accept or take cough medicine containing these opioids if you are already taking an opioid* medication. The combination may cause respiratory failure and death. Medication Agreement: You are responsible for carefully reading and following our Medication Agreement. This must be signed before receiving any prescriptions from our practice. Safely store a copy of your signed Agreement. Violations to the Agreement will result in no further prescriptions. (Additional copies of our Medication Agreement are available upon request.) Laws, Rules, & Regulations: All patients are expected to follow all [address] and [employer], Rules, [place] [employer]. Ignorance of the Laws does not constitute a valid excuse.  Illegal drugs and Controlled Substances: The use of illegal substances (including, but not limited to marijuana and its derivatives) and/or the illegal use of any controlled substances is strictly prohibited. Violation of this rule may result in the immediate and permanent discontinuation of any and all prescriptions being written by our practice. The  use of any illegal substances is prohibited. Adopted CDC guidelines & recommendations: Target dosing levels will be at or below 60 MME/day. Use of benzodiazepines** is not recommended. Urine Drug testing: Patients taking controlled substances will be required to provide a urine sample upon request. Do not void before coming to your medication management appointments. Hold emptying your bladder until you are admitted. The admitting nurse will inform you if a sample is required. Our practice reserves the right to call you at any time to provide a sample. Once receiving the call, you have 24 hours to comply with request. Not providing a sample upon request may result in termination of medication therapy.  Exceptions: There are only two exceptions to the rule of not receiving pain medications from other Healthcare Providers. Exception #1 (Emergencies): In the event of an emergency (i.e.: accident requiring emergency care), you are allowed to receive additional pain medication. However, you are responsible for: As soon as you are able, call our office [phone], at any time of the day or night, and leave a message stating your name, the date and nature of the emergency, and the name and dose of the medication prescribed. In the event that your call is answered by a member of our staff, make sure to document and save the date, time, and the name of the person that took your information.  Exception #2 (Planned Surgery): In the event  that you are scheduled by another doctor or dentist to have any type of surgery or procedure, you are allowed (for a period no longer than 30 days), to receive additional pain medication, for the acute post-op pain. However, in this case, you are responsible for picking up a copy of our "Post-op Pain Management for Surgeons" handout, and giving it to your surgeon or dentist. This document is available at our office, and does not require an appointment to obtain it. Simply go to our  office during business hours (Monday-Thursday from 8:00 AM to 4:00 PM) (Friday 8:00 AM to 12:00 Noon) or if you have a scheduled appointment with [place], prior to your surgery, and ask for it by name. In addition, you are responsible for: calling our office [phone], at any time of the day or night, and leaving a message stating your name, name of your surgeon, type of surgery, and date of procedure or surgery. Failure to comply with your responsibilities may result in termination of therapy involving the controlled substances.  Consequences:  Non-compliance with the above rules may result in permanent discontinuation of medication prescription therapy. All patients receiving any type of controlled substance is expected to comply with the above patient responsibilities. Not doing so may result in permanent discontinuation of medication prescription therapy. Medication Agreement Violation. Following the above rules, including your responsibilities will help you in avoiding a Medication Agreement Violation ("Breaking your Pain Medication Contract").  *Opioid medications include: morphine, codeine, oxycodone, oxymorphone, hydrocodone, hydromorphone, meperidine, tramadol, tapentadol, buprenorphine, fentanyl, methadone. **Benzodiazepine medications include: diazepam (Valium), alprazolam (Xanax), clonazepam (Klonopine), lorazepam (Ativan), clorazepate (Tranxene), chlordiazepoxide (Librium), estazolam (Prosom), oxazepam (Serax), temazepam (Restoril), triazolam (Halcion) (Last updated: 01/06/2023) ______________________________________________________________________      ______________________________________________________________________    Medication Recommendations and Reminders  Applies to: All patients receiving prescriptions (written and/or electronic).  Medication Rules & Regulations: You are responsible for reading, knowing, and following our "Medication Rules" document. These exist for your  safety and that of others. They are not flexible and neither are we. Dismissing or ignoring them is an act of "non-compliance" that may result in complete and irreversible termination of such medication therapy. For safety reasons, "non-compliance" will not be tolerated. As with the [place] fundamental legal principle of "ignorance of the law is no defense", we will accept no excuses for not having read and knowing the content of documents provided to you by our practice.  Pharmacy of record:  Definition: This is the pharmacy where your electronic prescriptions will be sent.  We do not endorse any particular pharmacy. It is up to you and your insurance to decide what pharmacy to use.  We do not restrict you in your choice of pharmacy. However, once we write for your prescriptions, we will NOT be re-sending more prescriptions to fix restricted supply problems created by your pharmacy, or your insurance.  The pharmacy listed in the electronic medical record should be the one where you want electronic prescriptions to be sent. If you choose to change pharmacy, simply notify our nursing staff. Changes will be made only during your regular appointments and not over the phone.  Recommendations: Keep all of your pain medications in a safe place, under lock and key, even if you live alone. We will NOT replace lost, stolen, or damaged medication. We do not accept "Police Reports" as proof of medications having been stolen. After you fill your prescription, take 1 week's worth of pills and put them away in a safe place. You should  keep a separate, properly labeled bottle for this purpose. The remainder should be kept in the original bottle. Use this as your primary supply, until it runs out. Once it's gone, then you know that you have 1 week's worth of medicine, and it is time to come in for a prescription refill. If you do this correctly, it is unlikely that you will ever run out of medicine. To make sure that the  above recommendation works, it is very important that you make sure your medication refill appointments are scheduled at least 1 week before you run out of medicine. To do this in an effective manner, make sure that you do not leave the office without scheduling your next medication management appointment. Always ask the nursing staff to show you in your prescription , when your medication will be running out. Then arrange for the receptionist to get you a return appointment, at least 7 days before you run out of medicine. Do not wait until you have 1 or 2 pills left, to come in. This is very poor planning and does not take into consideration that we may need to cancel appointments due to bad weather, sickness, or emergencies affecting our staff. DO NOT ACCEPT A "Partial Fill": If for any reason your pharmacy does not have enough pills/tablets to completely fill or refill your prescription, do not allow for a "partial fill". The law allows the pharmacy to complete that prescription within 72 hours, without requiring a new prescription. If they do not fill the rest of your prescription within those 72 hours, you will need a separate prescription to fill the remaining amount, which we will NOT provide. If the reason for the partial fill is your insurance, you will need to talk to the pharmacist about payment alternatives for the remaining tablets, but again, DO NOT ACCEPT A PARTIAL FILL, unless you can trust your pharmacist to obtain the remainder of the pills within 72 hours.  Prescription refills and/or changes in medication(s):  Prescription refills, and/or changes in dose or medication, will be conducted only during scheduled medication management appointments. (Applies to both, written and electronic prescriptions.) No refills on procedure days. No medication will be changed or started on procedure days. No changes, adjustments, and/or refills will be conducted on a procedure day. Doing so will interfere  with the diagnostic portion of the procedure. No phone refills. No medications will be "called into the pharmacy". No Fax refills. No weekend refills. No Holliday refills. No after hours refills.  Remember:  Business hours are:  Monday to Thursday 8:00 AM to 4:00 PM Provider's Schedule: Delano Metz, MD - Appointments are:  Medication management: Monday and Wednesday 8:00 AM to 4:00 PM Procedure day: Tuesday and Thursday 7:30 AM to 4:00 PM Edward Jolly, MD - Appointments are:  Medication management: Tuesday and Thursday 8:00 AM to 4:00 PM Procedure day: Monday and Wednesday 7:30 AM to 4:00 PM (Last update: 01/06/2022) ______________________________________________________________________      ______________________________________________________________________     Naloxone Nasal Spray  Why am I receiving this medication? [place] STOP ACT requires that all patients taking high dose opioids or at risk of opioids respiratory depression, be prescribed an opioid reversal agent, such as Naloxone (AKA: Narcan).  What is this medication? NALOXONE (nal OX one) treats opioid overdose, which causes slow or shallow breathing, severe drowsiness, or trouble staying awake. Call emergency services after using this medication. You may need additional treatment. Naloxone works by reversing the effects of opioids. It belongs  to a group of medications called opioid blockers.  COMMON BRAND NAME(S): Kloxxado, Narcan  What should I tell my care team before I take this medication? They need to know if you have any of these conditions: Heart disease Substance use disorder An unusual or allergic reaction to naloxone, other medications, foods, dyes, or preservatives Pregnant or trying to get pregnant Breast-feeding  When to use this medication? This medication is to be used for the treatment of respiratory depression (less than 8 breaths per minute) secondary to opioid overdose.   How  to use this medication? This medication is for use in the nose. Lay the person on their back. Support their neck with your hand and allow the head to tilt back before giving the medication. The nasal spray should be given into 1 nostril. After giving the medication, move the person onto their side. Do not remove or test the nasal spray until ready to use. Get emergency medical help right away after giving the first dose of this medication, even if the person wakes up. You should be familiar with how to recognize the signs and symptoms of a narcotic overdose. If more doses are needed, give the additional dose in the other nostril. Talk to your care team about the use of this medication in children. While this medication may be prescribed for children as young as newborns for selected conditions, precautions do apply.  Naloxone Overdosage: If you think you have taken too much of this medicine contact a poison control center or emergency room at once.  NOTE: This medicine is only for you. Do not share this medicine with others.  What if I miss a dose? This does not apply.  What may interact with this medication? This is only used during an emergency. No interactions are expected during emergency use. This list may not describe all possible interactions. Give your health care provider a list of all the medicines, herbs, non-prescription drugs, or dietary supplements you use. Also tell them if you smoke, drink alcohol, or use illegal drugs. Some items may interact with your medicine.  What should I watch for while using this medication? Keep this medication ready for use in the case of an opioid overdose. Make sure that you have the phone number of your care team and local hospital ready. You may need to have additional doses of this medication. Each nasal spray contains a single dose. Some emergencies may require additional doses. After use, bring the treated person to the nearest hospital or call 911.  Make sure the treating care team knows that the person has received a dose of this medication. You will receive additional instructions on what to do during and after use of this medication before an emergency occurs.  What side effects may I notice from receiving this medication? Side effects that you should report to your care team as soon as possible: Allergic reactions--skin rash, itching, hives, swelling of the face, lips, tongue, or throat Side effects that usually do not require medical attention (report these to your care team if they continue or are bothersome): Constipation Dryness or irritation inside the nose Headache Increase in blood pressure Muscle spasms Stuffy nose Toothache This list may not describe all possible side effects. Call your doctor for medical advice about side effects. You may report side effects to FDA at 1-800-FDA-1088.  Where should I keep my medication? Because this is an emergency medication, you should keep it with you at all times.  Keep out of  the reach of children and pets. Store between 20 and 25 degrees C (68 and 77 degrees F). Do not freeze. Throw away any unused medication after the expiration date. Keep in original box until ready to use.  NOTE: This sheet is a summary. It may not cover all possible information. If you have questions about this medicine, talk to your doctor, pharmacist, or health care provider.   2023 Elsevier/Gold Standard (2020-11-08 00:00:00)  ______________________________________________________________________

## 2023-02-22 ENCOUNTER — Other Ambulatory Visit: Payer: Self-pay | Admitting: Nurse Practitioner

## 2023-02-22 NOTE — Progress Notes (Unsigned)
02/24/2023 2:51 PM   Darren Allen 12/15/1965 [id number]  Referring provider: Larae Grooms, NP [address] [place],  [place] [address]  Urological history: 1.  Testosterone deficiency -Contributing factors of age, diabetes, former alcohol abuse and chronic pain medications -testosterone level (02/2023) 575 -Hemoglobin/hematocrit (02/2023) 11.4/39.3 -testosterone cypionate 200 mg/mL, 1 cc every 10 days   2. BPH with LU TS -PSA (02/2023) 1.4   3. ED -Contributing factors of age, BPH, diabetes, testosterone deficiency, chronic pain medications, blood pressure medications, COPD, hyperlipidemia, obesity, sleep apnea, former alcohol abuse and smoking  4.  Nephrolithiasis -Spontaneous passage of left ureteral stone in 2012  Chief Complaint  Patient presents with   Follow-up    HPI: Darren Allen is a 57 y.o. male who presents today for follow up rash on penis and hypogonadism.  Previous records reviewed.   He reached out to our office on February 04, 2023 stating he had a rash on the penis and his skin around it.  He had just finished up an antibiotic for a bacteria infection for boils on his chest and back.   According to the PCPs notes, the antibiotic was Septra.  At his visit on 02/08/2023, he states he has been dealing with this rash on and off for 3 months.  It comes and goes.  At first he thought he may be allergic to his dryer sheets, so he changed his dryer sheets but the rash persisted.  He has tried Vaseline and it a prescription cream that he was using for his ankle that started with a C.  Neither of these ointments have provided relief.  He states the rash has been around the coronal ridge, the skin of the penis and now it is on the head of the penis.  He states it feels like a foot burn.  He has not noticed any discharge.  He also has not been sexually active for the last 3 months.   He was started on Mycolog cream.  The rash has abated with the Mycolog  cream.  I PSS 6/1  No urinary complaints.   Patient denies any modifying or aggravating factors.  Patient denies any recent UTI's, gross hematuria, dysuria or suprapubic/flank pain.  Patient denies any fevers, chills, nausea or vomiting.     IPSS     Row Name 02/24/23 1412         International Prostate Symptom Score   How often have you had the sensation of not emptying your bladder? Not at All     How often have you had to urinate less than every two hours? Less than 1 in 5 times     How often have you found you stopped and started again several times when you urinated? Not at All     How often have you found it difficult to postpone urination? Not at All     How often have you had a weak urinary stream? Less than half the time     How often have you had to strain to start urination? Not at All     How many times did you typically get up at night to urinate? 2 Times     Total IPSS Score 5       Quality of Life due to urinary symptoms   If you were to spend the rest of your life with your urinary condition just the way it is now how would you feel about that? Pleased  Score:  1-7 Mild 8-19 Moderate 20-35 Severe    SHIM 19  He has no issues with ED. he is satisfied.  He rarely uses the Cialis.  He still has spontaneous erections.  He denies any pain with erections.  He denies any curvature with erection.   SHIM     Row Name 02/24/23 1415         SHIM: Over the last 6 months:   How do you rate your confidence that you could get and keep an erection? Moderate     When you had erections with sexual stimulation, how often were your erections hard enough for penetration (entering your partner)? Most Times (much more than half the time)     During sexual intercourse, how often were you able to maintain your erection after you had penetrated (entered) your partner? Most Times (much more than half the time)     During sexual intercourse, how difficult was it to  maintain your erection to completion of intercourse? Slightly Difficult     When you attempted sexual intercourse, how often was it satisfactory for you? Most Times (much more than half the time)       SHIM Total Score   SHIM 19              Score: 1-7 Severe ED 8-11 Moderate ED 12-16 Mild-Moderate ED 17-21 Mild ED 22-25 No ED   PMH: Past Medical History:  Diagnosis Date   Allergy    Calculus of kidney 02/04/2015   COPD (chronic obstructive pulmonary disease) (HCC)    Diabetes mellitus without complication (HCC)    type 2   Hypertension    Neuropathy     Surgical History: Past Surgical History:  Procedure Laterality Date   APPENDECTOMY     COLONOSCOPY WITH PROPOFOL N/A 12/31/2021   Procedure: COLONOSCOPY WITH PROPOFOL;  Surgeon: Wyline Mood, MD;  Location: [facility] ENDOSCOPY;  Service: Gastroenterology;  Laterality: N/A;   ESOPHAGOGASTRODUODENOSCOPY N/A 12/31/2021   Procedure: ESOPHAGOGASTRODUODENOSCOPY (EGD);  Surgeon: Wyline Mood, MD;  Location: [facility] ENDOSCOPY;  Service: Gastroenterology;  Laterality: N/A;   INCISION AND DRAINAGE PERIRECTAL ABSCESS N/A 02/04/2015   Procedure: IRRIGATION AND DEBRIDEMENT PERIRECTAL ABSCESS;  Surgeon: Ida Rogue, MD;  Location: [facility] ORS;  Service: General;  Laterality: N/A;   INSERTION OF MESH  01/20/2022   Procedure: INSERTION OF MESH;  Surgeon: Leafy Ro, MD;  Location: [facility] ORS;  Service: General;;   KIDNEY STONE SURGERY Right    lung mass removal N/A    ORIF ANKLE FRACTURE Left 03/23/2017   Procedure: OPEN REDUCTION INTERNAL FIXATION (ORIF) ANKLE FRACTURE;  Surgeon: Signa Kell, MD;  Location: [facility] ORS;  Service: Orthopedics;  Laterality: Left;   RECTAL EXAM UNDER ANESTHESIA  02/04/2015   Procedure: RECTAL EXAM UNDER ANESTHESIA;  Surgeon: Ida Rogue, MD;  Location: [facility] ORS;  Service: General;;   SYNDESMOSIS REPAIR Left 03/23/2017   Procedure: SYNDESMOSIS REPAIR;  Surgeon: Signa Kell, MD;  Location: [facility]  ORS;  Service: Orthopedics;  Laterality: Left;   UMBILICAL HERNIA REPAIR  01/20/2022   Procedure: HERNIA REPAIR UMBILICAL ADULT;  Surgeon: Leafy Ro, MD;  Location: [facility] ORS;  Service: General;;   XI ROBOTIC ASSISTED PARAESOPHAGEAL HERNIA REPAIR N/A 01/20/2022   Procedure: XI ROBOTIC ASSISTED PARAESOPHAGEAL HERNIA REPAIR, CONVERTED TO OPEN, RNFA to assist;  Surgeon: Leafy Ro, MD;  Location: [facility] ORS;  Service: General;  Laterality: N/A;    Home Medications:  Allergies as of 02/24/2023  Reactions   Glipizide Other (See Comments), Palpitations   Shaky, feel bad Other reaction(s): Dizziness   Cephalexin Rash   Duloxetine Anxiety, Nausea Only   Dupilumab Rash        Medication List        Accurate as of February 24, 2023  2:51 PM. If you have any questions, ask your nurse or doctor.          acetaminophen 500 MG tablet Commonly known as: TYLENOL Take 2 tablets (1,000 mg total) by mouth every 8 (eight) hours.   amitriptyline 10 MG tablet Commonly known as: ELAVIL Take 40 mg by mouth at bedtime.   ascorbic acid 500 MG tablet Commonly known as: VITAMIN C Take by mouth daily.   BD SafetyGlide Needle 18G X 1-1/2" Misc Generic drug: NEEDLE (DISP) 18 G 1 mg by Does not apply route every 7 (seven) days.   BD SafetyGlide Needle 21G X 1" Misc Generic drug: NEEDLE (DISP) 21 G 1 mg by Does not apply route every 7 (seven) days.   benzoyl peroxide 5 % external wash Generic drug: benzoyl peroxide Apply topically 2 (two) times daily.   Breztri Aerosphere 160-9-4.8 MCG/ACT Aero Generic drug: Budeson-Glycopyrrol-Formoterol Inhale into the lungs.   EPINEPHrine 0.3 mg/0.3 mL Soaj injection Commonly known as: EPI-PEN Inject 0.3 mg into the muscle as needed.   ferrous sulfate 325 (65 FE) MG tablet Take 325 mg by mouth daily with breakfast.   fluticasone 50 MCG/ACT nasal spray Commonly known as: FLONASE Place into both nostrils.   gabapentin 600 MG  tablet Commonly known as: NEURONTIN Take 600 mg by mouth 3 (three) times daily.   HumuLIN 70/30 KwikPen (70-30) 100 UNIT/ML KwikPen Generic drug: insulin isophane & regular human KwikPen Inject 58 Units into the skin 2 (two) times daily with a meal. 60 in the a.m. and 40u qhs   hydrochlorothiazide 25 MG tablet Commonly known as: HYDRODIURIL Take 1 tablet (25 mg total) by mouth daily.   HYDROcodone-acetaminophen 5-325 MG tablet Commonly known as: NORCO/VICODIN Take 1 tablet by mouth 2 (two) times daily as needed. Must last 30 days.   HYDROcodone-acetaminophen 5-325 MG tablet Commonly known as: NORCO/VICODIN Take 1 tablet by mouth 2 (two) times daily as needed. Must last 30 days. Start taking on: March 23, 2023   HYDROcodone-acetaminophen 5-325 MG tablet Commonly known as: NORCO/VICODIN Take 1 tablet by mouth 2 (two) times daily as needed. Must last 30 days. Start taking on: April 22, 2023   ipratropium-albuterol 0.5-2.5 (3) MG/3ML Soln Commonly known as: DUONEB Take by nebulization.   Magnesium Oxide -Mg Supplement 500 MG Tabs Take 1 tablet by mouth daily.   metFORMIN 500 MG 24 hr tablet Commonly known as: GLUCOPHAGE-XR TAKE 4 TABLETS BY MOUTH DAILY   montelukast 10 MG tablet Commonly known as: SINGULAIR Take 10 mg by mouth daily.   naloxone 4 MG/0.1ML Liqd nasal spray kit Commonly known as: NARCAN Place 1 spray into the nose as needed for up to 365 doses (for opioid-induced respiratory depresssion). In case of emergency (overdose), spray once into each nostril. If no response within 3 minutes, repeat application and call 911.   nystatin-triamcinolone ointment Commonly known as: MYCOLOG Apply 1 Application topically 2 (two) times daily.   ondansetron 4 MG tablet Commonly known as: Zofran Take 1 tablet (4 mg total) by mouth every 8 (eight) hours as needed for nausea or vomiting.   ProAir HFA 108 (90 Base) MCG/ACT inhaler Generic drug: albuterol Inhale 2 puffs  into the lungs every 6 (six) hours as needed.   rosuvastatin 40 MG tablet Commonly known as: CRESTOR TAKE 1 TABLET(40 MG) BY MOUTH DAILY   Semaglutide (2 MG/DOSE) 8 MG/3ML Sopn Inject into the skin.   tadalafil 5 MG tablet Commonly known as: CIALIS Take 1 tablet (5 mg total) by mouth daily as needed for erectile dysfunction.   tamsulosin 0.4 MG Caps capsule Commonly known as: FLOMAX Take 1 capsule (0.4 mg total) by mouth daily.   testosterone cypionate 200 MG/ML injection Commonly known as: DEPOTESTOSTERONE CYPIONATE Inject 1 mL (200 mg total) into the muscle every 14 (fourteen) days. Inject 1 cc every 10 days   tretinoin 0.025 % cream Commonly known as: RETIN-A Apply topically at bedtime.   triamcinolone cream 0.1 % Commonly known as: KENALOG Apply 1 Application topically 2 (two) times daily.        Allergies:  Allergies  Allergen Reactions   Glipizide Other (See Comments) and Palpitations    Shaky, feel bad Other reaction(s): Dizziness   Cephalexin Rash   Duloxetine Anxiety and Nausea Only   Dupilumab Rash    Family History: Family History  Problem Relation Age of Onset   Cancer Mother 48       Lung   Cancer Father        Colon   Heart disease Father    Alcohol abuse Father    Cancer Brother 74       Esophageal   Diabetes Brother    Heart disease Brother     Social History:  reports that he has quit smoking. His smoking use included cigarettes. He has a 17 pack-year smoking history. He has been exposed to tobacco smoke. He has never used smokeless tobacco. He reports that he does not currently use alcohol after a past usage of about 12.0 standard drinks of alcohol per week. He reports that he does not use drugs.  ROS: Pertinent ROS in HPI  Physical Exam: Blood pressure (!) 162/84, pulse (!) 109, height 6\' 2"  (1.88 m), weight 250 lb (113.4 kg).  Constitutional:  Well nourished. Alert and oriented, No acute distress. HEENT: [place] AT, moist mucus  membranes.  Trachea midline Cardiovascular: No clubbing, cyanosis, or edema. Respiratory: Normal respiratory effort, no increased work of breathing. Neurologic: Grossly intact, no focal deficits, moving all 4 extremities. Psychiatric: Normal mood and affect.   Laboratory Data: CMP     Component Value Date/Time   NA 138 02/10/2023 1417   NA 135 (L) 03/13/2014 2116   K 3.7 02/10/2023 1417   K 4.4 03/13/2014 2116   CL 98 02/10/2023 1417   CL 103 03/13/2014 2116   CO2 21 02/10/2023 1417   CO2 26 03/13/2014 2116   GLUCOSE 195 (H) 02/10/2023 1417   GLUCOSE 175 (H) 02/15/2022 1914   GLUCOSE 285 (H) 03/13/2014 2116   BUN 12 02/10/2023 1417   BUN 21 (H) 03/13/2014 2116   CREATININE 1.07 02/10/2023 1417   CREATININE 1.08 03/13/2014 2116   CALCIUM 9.6 02/10/2023 1417   CALCIUM 9.1 03/13/2014 2116   PROT 6.9 02/10/2023 1417   PROT 7.6 03/15/2012 0109   ALBUMIN 4.4 02/10/2023 1417   ALBUMIN 3.7 03/15/2012 0109   AST 50 (H) 02/10/2023 1417   AST 16 03/15/2012 0109   ALT 46 (H) 02/10/2023 1417   ALT 29 03/15/2012 0109   ALKPHOS 60 02/10/2023 1417   ALKPHOS 92 03/15/2012 0109   BILITOT 0.3 02/10/2023 1417   BILITOT 0.3 03/15/2012 0109  EGFR 80 02/10/2023 1417   GFRNONAA >60 02/15/2022 1914   GFRNONAA >60 03/13/2014 2116   GFRNONAA >60 03/15/2012 0109    CBC    Component Value Date/Time   WBC 5.7 02/10/2023 1417   WBC 6.6 02/15/2022 1914   RBC 5.32 02/10/2023 1417   RBC 3.91 (L) 02/15/2022 1914   HGB 11.4 (L) 02/23/2023 1344   HCT 39.3 02/23/2023 1344   PLT 307 02/10/2023 1417   MCV 73 (L) 02/10/2023 1417   MCV 83 03/13/2014 2116   MCH 22.2 (L) 02/10/2023 1417   MCH 27.1 02/15/2022 1914   MCHC 30.6 (L) 02/10/2023 1417   MCHC 31.7 02/15/2022 1914   RDW 17.2 (H) 02/10/2023 1417   RDW 15.2 (H) 03/13/2014 2116   LYMPHSABS 1.3 02/10/2023 1417   MONOABS 1.2 (H) 01/24/2022 0542   EOSABS 0.3 02/10/2023 1417   BASOSABS 0.1 02/10/2023 1417    Component     Latest Ref Rng  02/10/2023  Uric Acid     3.8 - 8.4 mg/dL 6.0   I have reviewed the labs.   Pertinent Imaging: N/A   Assessment & Plan:    1. Penile rash -Resolved   2.  Hypogonadism -testosterone levels therapeutic -hemoglobin/hematocrit low, he is currently taking iron supplements every other day because they constipate him, he has an appointment with his PCP tomorrow and he will mention his results to them to see if he needs any further blood work or medications to address the low hemoglobin/hematocrit -continue testosterone cypionate 200 mg/mL injections, 1.0 cc every 10 days  3. BPH with LUTS -PSA stable  -bothersome symptoms of post-void dribbling -continue conservative management, avoiding bladder irritants and timed voiding's -continue tamsulosin 0.4 mg to twice daily  4. Erectile dysfunction:    -continue tadalafil 5 mg, on-demand-dosing  5.  Nephrolithiasis -No issues with renal colic or passage of fragments since last visit  Return in about 6 months (around 08/25/2023) for PSA, testosterone (one week after injection) H & H - only .  These notes generated with voice recognition software. I apologize for typographical errors.  Cloretta Ned  [facility] [address]  [address] [place], [place] [address] [phone]

## 2023-02-23 ENCOUNTER — Other Ambulatory Visit: Payer: 59

## 2023-02-23 DIAGNOSIS — E291 Testicular hypofunction: Secondary | ICD-10-CM

## 2023-02-23 DIAGNOSIS — N138 Other obstructive and reflux uropathy: Secondary | ICD-10-CM

## 2023-02-24 ENCOUNTER — Encounter: Payer: Self-pay | Admitting: Urology

## 2023-02-24 ENCOUNTER — Ambulatory Visit (INDEPENDENT_AMBULATORY_CARE_PROVIDER_SITE_OTHER): Payer: 59 | Admitting: Urology

## 2023-02-24 ENCOUNTER — Telehealth: Payer: Self-pay | Admitting: Urology

## 2023-02-24 VITALS — BP 162/84 | HR 109 | Ht 74.0 in | Wt 250.0 lb

## 2023-02-24 DIAGNOSIS — Z87442 Personal history of urinary calculi: Secondary | ICD-10-CM

## 2023-02-24 DIAGNOSIS — E349 Endocrine disorder, unspecified: Secondary | ICD-10-CM

## 2023-02-24 DIAGNOSIS — N529 Male erectile dysfunction, unspecified: Secondary | ICD-10-CM | POA: Diagnosis not present

## 2023-02-24 DIAGNOSIS — N401 Enlarged prostate with lower urinary tract symptoms: Secondary | ICD-10-CM | POA: Diagnosis not present

## 2023-02-24 DIAGNOSIS — N200 Calculus of kidney: Secondary | ICD-10-CM

## 2023-02-24 DIAGNOSIS — E291 Testicular hypofunction: Secondary | ICD-10-CM

## 2023-02-24 DIAGNOSIS — R21 Rash and other nonspecific skin eruption: Secondary | ICD-10-CM

## 2023-02-24 DIAGNOSIS — N138 Other obstructive and reflux uropathy: Secondary | ICD-10-CM

## 2023-02-24 LAB — PSA: Prostate Specific Ag, Serum: 1.4 ng/mL (ref 0.0–4.0)

## 2023-02-24 LAB — TESTOSTERONE: Testosterone: 575 ng/dL (ref 264–916)

## 2023-02-24 LAB — HEMOGLOBIN: Hemoglobin: 11.4 g/dL — ABNORMAL LOW (ref 13.0–17.7)

## 2023-02-24 LAB — HEMATOCRIT: Hematocrit: 39.3 % (ref 37.5–51.0)

## 2023-02-24 MED ORDER — TESTOSTERONE CYPIONATE 200 MG/ML IM SOLN: 200.0000 mg | INTRAMUSCULAR | 0 refills | Status: DC

## 2023-02-24 NOTE — Telephone Encounter (Signed)
Darren Allen is the testosterone every 10 days or 14 days?  Summary: Inject 1 mL (200 mg total) into the muscle every 14 (fourteen) days. Inject 1 cc every 10 days, Starting Wed 02/24/2023, Normal

## 2023-02-24 NOTE — Telephone Encounter (Signed)
Darren Allen with [facility] called office needing to clarify directions for Testosterone Cypionate on prescription that was e-scribed. Her number is [phone]

## 2023-02-24 NOTE — Telephone Encounter (Signed)
Requested Prescriptions  Pending Prescriptions Disp Refills   metFORMIN (GLUCOPHAGE-XR) 500 MG 24 hr tablet [Pharmacy Med Name: METFORMIN HCL ER 500 MG TABLET] 360 tablet 0    Sig: TAKE 4 TABLETS BY MOUTH DAILY     Endocrinology:  Diabetes - Biguanides Failed - 02/22/2023  5:50 PM      Failed - HBA1C is between 0 and 7.9 and within 180 days    Hgb A1c MFr Bld  Date Value Ref Range Status  02/20/2022 6.7 (H) 4.8 - 5.6 % Final    Comment:             Prediabetes: 5.7 - 6.4          Diabetes: >6.4          Glycemic control for adults with diabetes: <7.0          Failed - CBC within normal limits and completed in the last 12 months    WBC  Date Value Ref Range Status  02/10/2023 5.7 3.4 - 10.8 x10E3/uL Final    Comment:    **Effective February 15, 2023 profile 161096 WBC will be made**   non-orderable as a stand-alone order code.   02/15/2022 6.6 4.0 - 10.5 K/uL Final   RBC  Date Value Ref Range Status  02/10/2023 5.32 4.14 - 5.80 x10E6/uL Final  02/15/2022 3.91 (L) 4.22 - 5.81 MIL/uL Final   Hemoglobin  Date Value Ref Range Status  02/23/2023 11.4 (L) 13.0 - 17.7 g/dL Final   Hematocrit  Date Value Ref Range Status  02/23/2023 39.3 37.5 - 51.0 % Final   MCHC  Date Value Ref Range Status  02/10/2023 30.6 (L) 31.5 - 35.7 g/dL Final  04/54/0981 19.1 30.0 - 36.0 g/dL Final   [facility]  Date Value Ref Range Status  02/10/2023 22.2 (L) 26.6 - 33.0 pg Final  02/15/2022 27.1 26.0 - 34.0 pg Final   MCV  Date Value Ref Range Status  02/10/2023 73 (L) 79 - 97 fL Final  03/13/2014 83 80 - 100 fL Final   No results found for: "PLTCOUNTKUC", "LABPLAT", "POCPLA" RDW  Date Value Ref Range Status  02/10/2023 17.2 (H) 11.6 - 15.4 % Final  03/13/2014 15.2 (H) 11.5 - 14.5 % Final         Passed - Cr in normal range and within 360 days    Creatinine  Date Value Ref Range Status  03/13/2014 1.08 0.60 - 1.30 mg/dL Final   Creatinine, Ser  Date Value Ref Range Status  02/10/2023  1.07 0.76 - 1.27 mg/dL Final         Passed - eGFR in normal range and within 360 days    EGFR (African American)  Date Value Ref Range Status  03/13/2014 >60 >64mL/min Final  03/15/2012 >60  Final   GFR calc Af Amer  Date Value Ref Range Status  12/07/2019 59 (L) >60 mL/min Final   EGFR (Non-African Amer.)  Date Value Ref Range Status  03/13/2014 >60 >18mL/min Final    Comment:    eGFR values <77mL/min/1.73 m2 may be an indication of chronic kidney disease (CKD). Calculated eGFR, using the MRDR Study equation, is useful in  patients with stable renal function. The eGFR calculation will not be reliable in acutely ill patients when serum creatinine is changing rapidly. It is not useful in patients on dialysis. The eGFR calculation may not be applicable to patients at the low and high extremes of body sizes, pregnant women, and vegetarians.  03/15/2012 >60  Final    Comment:    eGFR values <29mL/min/1.73 m2 may be an indication of chronic kidney disease (CKD). Calculated eGFR is useful in patients with stable renal function. The eGFR calculation will not be reliable in acutely ill patients when serum creatinine is changing rapidly. It is not useful in  patients on dialysis. The eGFR calculation may not be applicable to patients at the low and high extremes of body sizes, pregnant women, and vegetarians.    GFR, Estimated  Date Value Ref Range Status  02/15/2022 >60 >60 mL/min Final    Comment:    (NOTE) Calculated using the CKD-EPI Creatinine Equation (2021)    eGFR  Date Value Ref Range Status  02/10/2023 80 >59 mL/min/1.73 Final         Passed - B12 Level in normal range and within 720 days    Vitamin B-12  Date Value Ref Range Status  09/08/2021 391 180 - 914 pg/mL Final    Comment:    (NOTE) This assay is not validated for testing neonatal or myeloproliferative syndrome specimens for Vitamin B12 levels. Performed at [facility], [address] [place], [place] [address]          Passed - Valid encounter within last 6 months    Recent Outpatient Visits           2 weeks ago Recurrent boils   [place] [facility] Larae Grooms, NP   1 month ago Boils   [place] [facility] Larae Grooms, NP   5 months ago Acne vulgaris   [place] [facility] Mecum, Oswaldo Conroy, PA-C   6 months ago Hypertension associated with diabetes [facility])   [place] [facility] Larae Grooms, NP   8 months ago Type 2 diabetes mellitus with diabetic neuropathy, without long-term current use of insulin (HCC)   [place] [facility] Russell, Oralia Rud, DO       Future Appointments             Today [place], Elana Alm [facility] [place]   Tomorrow [place], Clydie Braun, NP [facility]   In 1 month Elie Goody, MD [facility]

## 2023-02-24 NOTE — Progress Notes (Signed)
   02/24/2023  Patient ID: Darren Allen, male   DOB: 17-Jan-1966, 57 y.o.   MRN: [id number]  Pharmacy Quality Measure Review  This patient is appearing on a report for being at risk of failing the adherence measure for cholesterol (statin) medications this calendar year.   Medication: rosuvastatin 40mg  Last fill date: 02/25/23 for 90 day supply  Insurance report was not up to date. No action needed at this time.   Lenna Gilford, PharmD, DPLA

## 2023-02-25 ENCOUNTER — Other Ambulatory Visit: Payer: Self-pay | Admitting: Urology

## 2023-02-25 ENCOUNTER — Encounter: Payer: Self-pay | Admitting: Nurse Practitioner

## 2023-02-25 ENCOUNTER — Ambulatory Visit: Payer: 59 | Admitting: Nurse Practitioner

## 2023-02-25 VITALS — BP 157/92 | HR 107 | Temp 98.4°F | Ht 74.0 in | Wt 252.4 lb

## 2023-02-25 DIAGNOSIS — E1169 Type 2 diabetes mellitus with other specified complication: Secondary | ICD-10-CM | POA: Diagnosis not present

## 2023-02-25 DIAGNOSIS — E1140 Type 2 diabetes mellitus with diabetic neuropathy, unspecified: Secondary | ICD-10-CM

## 2023-02-25 DIAGNOSIS — I152 Hypertension secondary to endocrine disorders: Secondary | ICD-10-CM

## 2023-02-25 DIAGNOSIS — E785 Hyperlipidemia, unspecified: Secondary | ICD-10-CM

## 2023-02-25 DIAGNOSIS — I739 Peripheral vascular disease, unspecified: Secondary | ICD-10-CM

## 2023-02-25 DIAGNOSIS — E291 Testicular hypofunction: Secondary | ICD-10-CM

## 2023-02-25 DIAGNOSIS — E1159 Type 2 diabetes mellitus with other circulatory complications: Secondary | ICD-10-CM

## 2023-02-25 DIAGNOSIS — Z0000 Encounter for general adult medical examination without abnormal findings: Secondary | ICD-10-CM | POA: Diagnosis not present

## 2023-02-25 DIAGNOSIS — E1142 Type 2 diabetes mellitus with diabetic polyneuropathy: Secondary | ICD-10-CM | POA: Diagnosis not present

## 2023-02-25 DIAGNOSIS — D509 Iron deficiency anemia, unspecified: Secondary | ICD-10-CM

## 2023-02-25 LAB — URINALYSIS, ROUTINE W REFLEX MICROSCOPIC
Bilirubin, UA: NEGATIVE
Leukocytes,UA: NEGATIVE
Nitrite, UA: NEGATIVE
RBC, UA: NEGATIVE
Specific Gravity, UA: 1.025 (ref 1.005–1.030)
Urobilinogen, Ur: 0.2 mg/dL (ref 0.2–1.0)
pH, UA: 7 (ref 5.0–7.5)

## 2023-02-25 LAB — MICROALBUMIN, URINE WAIVED
Creatinine, Urine Waived: 200 mg/dL (ref 10–300)
Microalb, Ur Waived: 150 mg/L — ABNORMAL HIGH (ref 0–19)

## 2023-02-25 LAB — MICROSCOPIC EXAMINATION
Bacteria, UA: NONE SEEN
Epithelial Cells (non renal): NONE SEEN /[HPF] (ref 0–10)

## 2023-02-25 MED ORDER — LISINOPRIL 10 MG PO TABS: 10.0000 mg | ORAL_TABLET | Freq: Every day | ORAL | 1 refills | Status: DC

## 2023-02-25 MED ORDER — DAPAGLIFLOZIN PROPANEDIOL 10 MG PO TABS
10.0000 mg | ORAL_TABLET | Freq: Every day | ORAL | 1 refills | Status: DC
Start: 2023-02-25 — End: 2023-09-21

## 2023-02-25 MED ORDER — TESTOSTERONE CYPIONATE 200 MG/ML IM SOLN: INTRAMUSCULAR | 0 refills | Status: DC

## 2023-02-25 NOTE — Assessment & Plan Note (Signed)
Chronic.  Not well controlled.  Was taken off Lisinopril due to hypotension.  Will restart Lisinopril at 10mg  vs 20mg .  Continue with hydrochlorothiazide.  Follow up in 1 month.  Call sooner if concerns arise.

## 2023-02-25 NOTE — Assessment & Plan Note (Signed)
Chronic.  Controlled.  Continue with current medication regimen of Crestor 40mg.  Labs ordered today.  Return to clinic in 6 months for reevaluation.  Call sooner if concerns arise.   

## 2023-02-25 NOTE — Assessment & Plan Note (Signed)
Chronic. Not well controlled.  Last A1c was 8.5%.  On Insulin 42u am and 84pm.  Sugars are running >200.  He is also on Ozempic 2mg .  He is managed by Dr. Tedd Sias.  Could consider changing to [facility].  Will add Farxiga for kidney protection and A1c control.  Labs ordered today.  Follow up in 3 months.  Call sooner if concerns arise.

## 2023-02-25 NOTE — Assessment & Plan Note (Signed)
Chronic.  Controlled.  Continue with current medication regimen.  Followed by pain management.  Doing well at this time.  Labs ordered today.  Return to clinic in 6 months for reevaluation.  Call sooner if concerns arise.

## 2023-02-25 NOTE — Progress Notes (Signed)
BP (!) 157/92 (BP Location: Left Arm, Patient Position: Sitting, Cuff Size: Large)   Pulse (!) 107   Temp 98.4 F (36.9 C) (Oral)   Ht 6\' 2"  (1.88 m)   Wt 252 lb 6.4 oz (114.5 kg)   SpO2 97%   BMI 32.41 kg/m    Subjective:    Patient ID: Darren Allen, male    DOB: 15-Jul-1965, 57 y.o.   MRN: [id number]  HPI: Darren Allen is a 57 y.o. male presenting on 02/25/2023 for comprehensive medical examination. Current medical complaints include:none  He currently lives with: Interim Problems from his last visit: no  HYPERTENSION / HYPERLIPIDEMIA Satisfied with current treatment? no Duration of hypertension: years BP monitoring frequency: daily BP range: 140/90 BP medication side effects: no Past BP meds:  Duration of hyperlipidemia: years Cholesterol medication side effects: no Cholesterol supplements: none Past cholesterol medications: rosuvastatin (crestor) Medication compliance: excellent compliance Aspirin: no Recent stressors: no Recurrent headaches: no Visual changes: no Palpitations: no Dyspnea: no Chest pain: no Lower extremity edema: no Dizzy/lightheaded: no  DIABETES Hypoglycemic episodes:no Polydipsia/polyuria: no Visual disturbance: no Chest pain: no Paresthesias: no Glucose Monitoring: yes  Accucheck frequency:  continuous  Fasting glucose: 200-250  Post prandial:  Evening:  Before meals: Taking Insulin?: yes  Long acting insulin: 84u am and 42 u in the evening  Short acting insulin: possibly starting soon Blood Pressure Monitoring: daily  Retinal Examination:  has it scheduled for october Foot Exam: Up to Date Diabetic Education: Not Completed Pneumovax: Up to Date Influenza: Not up to Date Aspirin: no  COPD COPD status:controlled Satisfied with current treatment?: no Oxygen use: no Dyspnea frequency: yes Cough frequency: yes Rescue inhaler frequency:  3/4 x per day Limitation of activity: yes Productive cough: yes Last  Spirometry:  Pneumovax: Up to Date Influenza: Not up to Date See's Pulmonology- quit smoking earlier this year.  Has noticed an improvement in congestion and breathing.  Continue with Breztri.   Depression Screen done today and results listed below:     02/25/2023   10:08 AM 02/17/2023   11:45 AM 02/10/2023    2:04 PM 01/22/2023   10:07 AM 01/07/2023    1:52 PM  Depression screen PHQ 2/9  Decreased Interest 0 0 0 1 0  Down, Depressed, Hopeless 0 0 0 0 0  PHQ - 2 Score 0 0 0 1 0  Altered sleeping 0  0 2   Tired, decreased energy 0  0 1   Change in appetite 0  0 0   Feeling bad or failure about yourself  0  0 0   Trouble concentrating 0  0 0   Moving slowly or fidgety/restless 0  0 0   Suicidal thoughts 0  0 0   PHQ-9 Score 0  0 4     The patient does not have a history of falls. I did complete a risk assessment for falls. A plan of care for falls was documented.   Past Medical History:  Past Medical History:  Diagnosis Date   Allergy    Calculus of kidney 02/04/2015   COPD (chronic obstructive pulmonary disease) (HCC)    Diabetes mellitus without complication (HCC)    type 2   Hypertension    Neuropathy     Surgical History:  Past Surgical History:  Procedure Laterality Date   APPENDECTOMY     COLONOSCOPY WITH PROPOFOL N/A 12/31/2021   Procedure: COLONOSCOPY WITH PROPOFOL;  Surgeon: Wyline Mood, MD;  Location: [facility];  Service: Gastroenterology;  Laterality: N/A;   ESOPHAGOGASTRODUODENOSCOPY N/A 12/31/2021   Procedure: ESOPHAGOGASTRODUODENOSCOPY (EGD);  Surgeon: Wyline Mood, MD;  Location: [facility];  Service: Gastroenterology;  Laterality: N/A;   INCISION AND DRAINAGE PERIRECTAL ABSCESS N/A 02/04/2015   Procedure: IRRIGATION AND DEBRIDEMENT PERIRECTAL ABSCESS;  Surgeon: Ida Rogue, MD;  Location: [facility];  Service: General;  Laterality: N/A;   INSERTION OF MESH  01/20/2022   Procedure: INSERTION OF MESH;  Surgeon: Leafy Ro, MD;   Location: [facility];  Service: General;;   KIDNEY STONE SURGERY Right    lung mass removal N/A    ORIF ANKLE FRACTURE Left 03/23/2017   Procedure: OPEN REDUCTION INTERNAL FIXATION (ORIF) ANKLE FRACTURE;  Surgeon: Signa Kell, MD;  Location: [facility];  Service: Orthopedics;  Laterality: Left;   RECTAL EXAM UNDER ANESTHESIA  02/04/2015   Procedure: RECTAL EXAM UNDER ANESTHESIA;  Surgeon: Ida Rogue, MD;  Location: [facility];  Service: General;;   SYNDESMOSIS REPAIR Left 03/23/2017   Procedure: SYNDESMOSIS REPAIR;  Surgeon: Signa Kell, MD;  Location: [facility];  Service: Orthopedics;  Laterality: Left;   UMBILICAL HERNIA REPAIR  01/20/2022   Procedure: HERNIA REPAIR UMBILICAL ADULT;  Surgeon: Leafy Ro, MD;  Location: [facility];  Service: General;;   XI ROBOTIC ASSISTED PARAESOPHAGEAL HERNIA REPAIR N/A 01/20/2022   Procedure: XI ROBOTIC ASSISTED PARAESOPHAGEAL HERNIA REPAIR, CONVERTED TO OPEN, RNFA to assist;  Surgeon: Leafy Ro, MD;  Location: [facility];  Service: General;  Laterality: N/A;    Medications:  Current Outpatient Medications on File Prior to Visit  Medication Sig   acetaminophen (TYLENOL) 500 MG tablet Take 2 tablets (1,000 mg total) by mouth every 8 (eight) hours.   amitriptyline (ELAVIL) 10 MG tablet Take 40 mg by mouth at bedtime.   ascorbic acid (VITAMIN C) 500 MG tablet Take by mouth daily.   benzoyl peroxide 5 % external liquid Apply topically 2 (two) times daily.   Budeson-Glycopyrrol-Formoterol (BREZTRI AEROSPHERE) 160-9-4.8 MCG/ACT AERO Inhale into the lungs.   EPINEPHrine 0.3 mg/0.3 mL IJ SOAJ injection Inject 0.3 mg into the muscle as needed.   ferrous sulfate 325 (65 FE) MG tablet Take 325 mg by mouth daily with breakfast.   fluticasone (FLONASE) 50 MCG/ACT nasal spray Place into both nostrils.   gabapentin (NEURONTIN) 600 MG tablet Take 600 mg by mouth 3 (three) times daily.   hydrochlorothiazide (HYDRODIURIL) 25 MG tablet Take 1 tablet (25 mg  total) by mouth daily.   HYDROcodone-acetaminophen (NORCO/VICODIN) 5-325 MG tablet Take 1 tablet by mouth 2 (two) times daily as needed. Must last 30 days.   [START ON 03/23/2023] HYDROcodone-acetaminophen (NORCO/VICODIN) 5-325 MG tablet Take 1 tablet by mouth 2 (two) times daily as needed. Must last 30 days.   [START ON 04/22/2023] HYDROcodone-acetaminophen (NORCO/VICODIN) 5-325 MG tablet Take 1 tablet by mouth 2 (two) times daily as needed. Must last 30 days.   insulin isophane & regular human (HUMULIN 70/30 KWIKPEN) (70-30) 100 UNIT/ML KwikPen Inject 58 Units into the skin 2 (two) times daily with a meal. 60 in the a.m. and 40u qhs   ipratropium-albuterol (DUONEB) 0.5-2.5 (3) MG/3ML SOLN Take by nebulization.   Magnesium Oxide -Mg Supplement 500 MG TABS Take 1 tablet by mouth daily.   metFORMIN (GLUCOPHAGE-XR) 500 MG 24 hr tablet TAKE 4 TABLETS BY MOUTH DAILY   montelukast (SINGULAIR) 10 MG tablet Take 10 mg by mouth daily.   naloxone (NARCAN) nasal spray 4 mg/0.1 mL Place 1 spray into  the nose as needed for up to 365 doses (for opioid-induced respiratory depresssion). In case of emergency (overdose), spray once into each nostril. If no response within 3 minutes, repeat application and call 911.   NEEDLE, DISP, 18 G (BD SAFETYGLIDE NEEDLE) 18G X 1-1/2" MISC 1 mg by Does not apply route every 7 (seven) days.   NEEDLE, DISP, 21 G (BD SAFETYGLIDE NEEDLE) 21G X 1" MISC 1 mg by Does not apply route every 7 (seven) days.   nystatin-triamcinolone ointment (MYCOLOG) Apply 1 Application topically 2 (two) times daily.   ondansetron (ZOFRAN) 4 MG tablet Take 1 tablet (4 mg total) by mouth every 8 (eight) hours as needed for nausea or vomiting.   PROAIR HFA 108 (90 Base) MCG/ACT inhaler Inhale 2 puffs into the lungs every 6 (six) hours as needed.   rosuvastatin (CRESTOR) 40 MG tablet TAKE 1 TABLET(40 MG) BY MOUTH DAILY   Semaglutide, 2 MG/DOSE, 8 MG/3ML SOPN Inject into the skin.   tadalafil (CIALIS) 5 MG tablet  Take 1 tablet (5 mg total) by mouth daily as needed for erectile dysfunction.   tamsulosin (FLOMAX) 0.4 MG CAPS capsule Take 1 capsule (0.4 mg total) by mouth daily.   tretinoin (RETIN-A) 0.025 % cream Apply topically at bedtime.   triamcinolone cream (KENALOG) 0.1 % Apply 1 Application topically 2 (two) times daily.   No current facility-administered medications on file prior to visit.    Allergies:  Allergies  Allergen Reactions   Glipizide Other (See Comments) and Palpitations    Shaky, feel bad Other reaction(s): Dizziness   Cephalexin Rash   Duloxetine Anxiety and Nausea Only   Dupilumab Rash    Social History:  Social History   Socioeconomic History   Marital status: Single    Spouse name: Not on file   Number of children: Not on file   Years of education: Not on file   Highest education level: 8th grade  Occupational History   Not on file  Tobacco Use   Smoking status: Former    Current packs/day: 0.50    Average packs/day: 0.5 packs/day for 34.0 years (17.0 ttl pk-yrs)    Types: Cigarettes    Passive exposure: Past   Smokeless tobacco: Never   Tobacco comments:    patient using Nicoderm patches  Vaping Use   Vaping status: Never Used  Substance and Sexual Activity   Alcohol use: Not Currently    Alcohol/week: 12.0 standard drinks of alcohol    Types: 12 Cans of beer per week    Comment: Quit February 2023   Drug use: No   Sexual activity: Yes  Other Topics Concern   Not on file  Social History Narrative   Not on file   Social Drivers of Health   Financial Resource Strain: Medium Risk (02/25/2023)   Overall Financial Resource Strain (CARDIA)    Difficulty of Paying Living Expenses: Somewhat hard  Food Insecurity: Food Insecurity Present (02/25/2023)   Hunger Vital Sign    Worried About Running Out of Food in the Last Year: Sometimes true    Ran Out of Food in the Last Year: Sometimes true  Transportation Needs: No Transportation Needs (02/25/2023)    PRAPARE - Administrator, Civil Service (Medical): No    Lack of Transportation (Non-Medical): No  Physical Activity: Insufficiently Active (02/25/2023)   Exercise Vital Sign    Days of Exercise per Week: 5 days    Minutes of Exercise per Session: 10 min  Stress: No Stress Concern Present (02/25/2023)   [employer] of Occupational Health - Occupational Stress Questionnaire    Feeling of Stress : Only a little  Social Connections: Socially Isolated (02/25/2023)   Social Connection and Isolation Panel [NHANES]    Frequency of Communication with Friends and Family: Once a week    Frequency of Social Gatherings with Friends and Family: Once a week    Attends Religious Services: Never    Database administrator or Organizations: No    Attends Banker Meetings: Never    Marital Status: Divorced  Catering manager Violence: Not At Risk (06/01/2022)   Humiliation, Afraid, Rape, and Kick questionnaire    Fear of Current or Ex-Partner: No    Emotionally Abused: No    Physically Abused: No    Sexually Abused: No   Social History   Tobacco Use  Smoking Status Former   Current packs/day: 0.50   Average packs/day: 0.5 packs/day for 34.0 years (17.0 ttl pk-yrs)   Types: Cigarettes   Passive exposure: Past  Smokeless Tobacco Never  Tobacco Comments   patient using Nicoderm patches   Social History   Substance and Sexual Activity  Alcohol Use Not Currently   Alcohol/week: 12.0 standard drinks of alcohol   Types: 12 Cans of beer per week   Comment: Quit February 2023    Family History:  Family History  Problem Relation Age of Onset   Cancer Mother 62       Lung   Cancer Father        Colon   Heart disease Father    Alcohol abuse Father    Cancer Brother 22       Esophageal   Diabetes Brother    Heart disease Brother     Past medical history, surgical history, medications, allergies, family history and social history reviewed with patient today  and changes made to appropriate areas of the chart.   Review of Systems  HENT:         Denies vision changes.  Eyes:  Negative for blurred vision and double vision.  Respiratory:  Positive for shortness of breath.   Cardiovascular:  Negative for chest pain, palpitations and leg swelling.  Neurological:  Negative for dizziness, tingling and headaches.  Endo/Heme/Allergies:  Negative for polydipsia.       Denies Polyuria   All other ROS negative except what is listed above and in the HPI.      Objective:    BP (!) 157/92 (BP Location: Left Arm, Patient Position: Sitting, Cuff Size: Large)   Pulse (!) 107   Temp 98.4 F (36.9 C) (Oral)   Ht 6\' 2"  (1.88 m)   Wt 252 lb 6.4 oz (114.5 kg)   SpO2 97%   BMI 32.41 kg/m   Wt Readings from Last 3 Encounters:  02/25/23 252 lb 6.4 oz (114.5 kg)  02/24/23 250 lb (113.4 kg)  02/17/23 250 lb (113.4 kg)    Physical Exam Vitals and nursing note reviewed.  Constitutional:      General: He is not in acute distress.    Appearance: Normal appearance. He is obese. He is not ill-appearing, toxic-appearing or diaphoretic.  HENT:     Head: Normocephalic.     Right Ear: Tympanic membrane, ear canal and external ear normal.     Left Ear: Tympanic membrane, ear canal and external ear normal.     Nose: Nose normal. No congestion or rhinorrhea.     Mouth/Throat:  Mouth: Mucous membranes are moist.  Eyes:     General:        Right eye: No discharge.        Left eye: No discharge.     Extraocular Movements: Extraocular movements intact.     Conjunctiva/sclera: Conjunctivae normal.     Pupils: Pupils are equal, round, and reactive to light.  Cardiovascular:     Rate and Rhythm: Normal rate and regular rhythm.     Heart sounds: No murmur heard. Pulmonary:     Effort: Pulmonary effort is normal. No respiratory distress.     Breath sounds: No wheezing, rhonchi or rales.  Abdominal:     General: Abdomen is flat. Bowel sounds are normal. There is  no distension.     Palpations: Abdomen is soft.     Tenderness: There is no abdominal tenderness. There is no guarding.  Musculoskeletal:     Cervical back: Normal range of motion and neck supple.  Skin:    General: Skin is warm and dry.     Capillary Refill: Capillary refill takes less than 2 seconds.  Neurological:     General: No focal deficit present.     Mental Status: He is alert and oriented to person, place, and time.     Cranial Nerves: No cranial nerve deficit.     Motor: No weakness.     Deep Tendon Reflexes: Reflexes normal.  Psychiatric:        Mood and Affect: Mood normal.        Behavior: Behavior normal.        Thought Content: Thought content normal.        Judgment: Judgment normal.     Results for orders placed or performed in visit on 02/23/23  PSA   Collection Time: 02/23/23  1:44 PM  Result Value Ref Range   Prostate Specific Ag, Serum 1.4 0.0 - 4.0 ng/mL  Testosterone   Collection Time: 02/23/23  1:44 PM  Result Value Ref Range   Testosterone 575 264 - 916 ng/dL  Hemoglobin   Collection Time: 02/23/23  1:44 PM  Result Value Ref Range   Hemoglobin 11.4 (L) 13.0 - 17.7 g/dL  Hematocrit   Collection Time: 02/23/23  1:44 PM  Result Value Ref Range   Hematocrit 39.3 37.5 - 51.0 %      Assessment & Plan:   Problem List Items Addressed This Visit       Cardiovascular and Mediastinum   Hypertension associated with diabetes (HCC)   Chronic.  Not well controlled.  Was taken off Lisinopril due to hypotension.  Will restart Lisinopril at 10mg  vs 20mg .  Continue with hydrochlorothiazide.  Follow up in 1 month.  Call sooner if concerns arise.       Relevant Medications   lisinopril (ZESTRIL) 10 MG tablet   dapagliflozin propanediol (FARXIGA) 10 MG TABS tablet   PVD (peripheral vascular disease) (HCC)   Chronic.  Controlled.  Continue with current medication regimen.  Labs ordered today.  Return to clinic in 6 months for reevaluation.  Call sooner if  concerns arise.        Relevant Medications   lisinopril (ZESTRIL) 10 MG tablet     Endocrine   Diabetic peripheral neuropathy (HCC) (Chronic)   Chronic.  Controlled.  Continue with current medication regimen.  Followed by pain management.  Doing well at this time.  Labs ordered today.  Return to clinic in 6 months for reevaluation.  Call sooner if concerns  arise.       Relevant Medications   lisinopril (ZESTRIL) 10 MG tablet   dapagliflozin propanediol (FARXIGA) 10 MG TABS tablet   Hyperlipidemia associated with type 2 diabetes mellitus (HCC)   Chronic.  Controlled.  Continue with current medication regimen of Crestor 40mg .  Labs ordered today.  Return to clinic in 6 months for reevaluation.  Call sooner if concerns arise.       Relevant Medications   lisinopril (ZESTRIL) 10 MG tablet   dapagliflozin propanediol (FARXIGA) 10 MG TABS tablet   Other Relevant Orders   Lipid panel   Type 2 diabetes mellitus with diabetic neuropathy, without long-term current use of insulin (HCC)   Chronic. Not well controlled.  Last A1c was 8.5%.  On Insulin 42u am and 84pm.  Sugars are running >200.  He is also on Ozempic 2mg .  He is managed by Dr. Tedd Sias.  Could consider changing to [facility].  Will add Farxiga for kidney protection and A1c control.  Labs ordered today.  Follow up in 3 months.  Call sooner if concerns arise.       Relevant Medications   lisinopril (ZESTRIL) 10 MG tablet   dapagliflozin propanediol (FARXIGA) 10 MG TABS tablet   Other Relevant Orders   HgB A1c   Microalbumin, Urine Waived     Other   Iron deficiency anemia   Chronic.  Likely due to CKD stage 3.  Patient started on Farxiga.  Labs ordered at visit today.  Will make recommendations based on lab results.        Other Visit Diagnoses       Annual physical exam    -  Primary   Health maintenance reviewed during visit today.  Labs ordered.  Vaccines reviewed.  Colon screening up to date.   Relevant Orders   TSH    Lipid panel   CBC with Differential/Platelet   Comprehensive metabolic panel   Urinalysis, Routine w reflex microscopic   HgB A1c        Discussed aspirin prophylaxis for myocardial infarction prevention and decision was it was not indicated  LABORATORY TESTING:  Health maintenance labs ordered today as discussed above.   The natural history of prostate cancer and ongoing controversy regarding screening and potential treatment outcomes of prostate cancer has been discussed with the patient. The meaning of a false positive PSA and a false negative PSA has been discussed. He indicates understanding of the limitations of this screening test and wishes to proceed with screening PSA testing.   IMMUNIZATIONS:   - Tdap: Tetanus vaccination status reviewed: last tetanus booster within 10 years. - Influenza: Postponed to flu season - Pneumovax: Up to date - Prevnar: Not applicable - COVID: Up to date - HPV: Not applicable - Shingrix vaccine:  received first would like to wait on second  SCREENING: - Colonoscopy: Ordered today  Discussed with patient purpose of the colonoscopy is to detect colon cancer at curable precancerous or early stages   - AAA Screening: Not applicable  -Hearing Test: Not applicable  -Spirometry: Not applicable   PATIENT COUNSELING:    Sexuality: Discussed sexually transmitted diseases, partner selection, use of condoms, avoidance of unintended pregnancy  and contraceptive alternatives.   Advised to avoid cigarette smoking.  I discussed with the patient that most people either abstain from alcohol or drink within safe limits (<=14/week and <=4 drinks/occasion for males, <=7/weeks and <= 3 drinks/occasion for females) and that the risk for alcohol disorders and other health  effects rises proportionally with the number of drinks per week and how often a drinker exceeds daily limits.  Discussed cessation/primary prevention of drug use and availability of treatment  for abuse.   Diet: Encouraged to adjust caloric intake to maintain  or achieve ideal body weight, to reduce intake of dietary saturated fat and total fat, to limit sodium intake by avoiding high sodium foods and not adding table salt, and to maintain adequate dietary potassium and calcium preferably from fresh fruits, vegetables, and low-fat dairy products.    stressed the importance of regular exercise  Injury prevention: Discussed safety belts, safety helmets, smoke detector, smoking near bedding or upholstery.   Dental health: Discussed importance of regular tooth brushing, flossing, and dental visits.   Follow up plan: NEXT PREVENTATIVE PHYSICAL DUE IN 1 YEAR. Return in about 1 month (around 03/28/2023) for BP Check.

## 2023-02-25 NOTE — Assessment & Plan Note (Signed)
Chronic.  Controlled.  Continue with current medication regimen.  Labs ordered today.  Return to clinic in 6 months for reevaluation.  Call sooner if concerns arise.  ? ?

## 2023-02-25 NOTE — Assessment & Plan Note (Signed)
Chronic.  Likely due to CKD stage 3.  Patient started on Farxiga.  Labs ordered at visit today.  Will make recommendations based on lab results.

## 2023-02-26 LAB — COMPREHENSIVE METABOLIC PANEL
ALT: 39 [IU]/L (ref 0–44)
AST: 34 [IU]/L (ref 0–40)
Albumin: 4.2 g/dL (ref 3.8–4.9)
Alkaline Phosphatase: 59 [IU]/L (ref 44–121)
BUN/Creatinine Ratio: 11 (ref 9–20)
BUN: 13 mg/dL (ref 6–24)
Bilirubin Total: 0.5 mg/dL (ref 0.0–1.2)
CO2: 23 mmol/L (ref 20–29)
Calcium: 9.8 mg/dL (ref 8.7–10.2)
Chloride: 96 mmol/L (ref 96–106)
Creatinine, Ser: 1.15 mg/dL (ref 0.76–1.27)
Globulin, Total: 2.7 g/dL (ref 1.5–4.5)
Glucose: 234 mg/dL — ABNORMAL HIGH (ref 70–99)
Potassium: 3.5 mmol/L (ref 3.5–5.2)
Sodium: 137 mmol/L (ref 134–144)
Total Protein: 6.9 g/dL (ref 6.0–8.5)
eGFR: 73 mL/min/{1.73_m2} (ref >59)

## 2023-02-26 LAB — TSH: TSH: 1.51 u[IU]/mL (ref 0.450–4.500)

## 2023-02-26 LAB — CBC WITH DIFFERENTIAL/PLATELET
Basophils Absolute: 0 10*3/uL (ref 0.0–0.2)
Basos: 1 %
EOS (ABSOLUTE): 0.3 10*3/uL (ref 0.0–0.4)
Eos: 4 %
Hematocrit: 39.6 % (ref 37.5–51.0)
Hemoglobin: 11.6 g/dL — ABNORMAL LOW (ref 13.0–17.7)
Immature Grans (Abs): 0 10*3/uL (ref 0.0–0.1)
Immature Granulocytes: 0 %
Lymphocytes Absolute: 1.3 10*3/uL (ref 0.7–3.1)
Lymphs: 19 %
MCH: 21.8 pg — ABNORMAL LOW (ref 26.6–33.0)
MCHC: 29.3 g/dL — ABNORMAL LOW (ref 31.5–35.7)
MCV: 74 fL — ABNORMAL LOW (ref 79–97)
Monocytes Absolute: 0.8 10*3/uL (ref 0.1–0.9)
Monocytes: 12 %
Neutrophils Absolute: 4.5 10*3/uL (ref 1.4–7.0)
Neutrophils: 64 %
Platelets: 272 10*3/uL (ref 150–450)
RBC: 5.32 x10E6/uL (ref 4.14–5.80)
RDW: 17.5 % — ABNORMAL HIGH (ref 11.6–15.4)
WBC: 7 10*3/uL (ref 3.4–10.8)

## 2023-02-26 LAB — LIPID PANEL
Chol/HDL Ratio: 5.2 {ratio} — ABNORMAL HIGH (ref 0.0–5.0)
Cholesterol, Total: 114 mg/dL (ref 100–199)
HDL: 22 mg/dL — ABNORMAL LOW (ref >39)
LDL Chol Calc (NIH): 32 mg/dL (ref 0–99)
Triglycerides: 416 mg/dL — ABNORMAL HIGH (ref 0–149)
VLDL Cholesterol Cal: 60 mg/dL — ABNORMAL HIGH (ref 5–40)

## 2023-02-26 LAB — HEMOGLOBIN A1C
Est. average glucose Bld gHb Est-mCnc: 192 mg/dL
Hgb A1c MFr Bld: 8.3 % — ABNORMAL HIGH (ref 4.8–5.6)

## 2023-03-01 ENCOUNTER — Ambulatory Visit (INDEPENDENT_AMBULATORY_CARE_PROVIDER_SITE_OTHER): Payer: 59 | Admitting: Surgery

## 2023-03-01 ENCOUNTER — Encounter: Payer: Self-pay | Admitting: Surgery

## 2023-03-01 VITALS — BP 143/77 | HR 102 | Temp 98.4°F | Ht 74.0 in | Wt 247.6 lb

## 2023-03-01 DIAGNOSIS — K432 Incisional hernia without obstruction or gangrene: Secondary | ICD-10-CM

## 2023-03-01 DIAGNOSIS — R1033 Periumbilical pain: Secondary | ICD-10-CM

## 2023-03-01 NOTE — Patient Instructions (Addendum)
Your CT is scheduled for 12/19/202 1:15 pm (arrive by 1 pm) at [facility] on [address].

## 2023-03-01 NOTE — Progress Notes (Signed)
Outpatient Surgical Follow Up  03/01/2023  Darren Allen is an 57 y.o. male.   Chief Complaint  Patient presents with   Follow-up    Possible incisional hernia    HPI: Darren Allen is a 57 year old male well-known to me with history of COPD and prior large paraesophageal hernia that needed to be repaired open.  This was done on November 2023.  Reports that over the last month or so has felt a bulge and seems to be worsening with Valsalva mild pain.  No fevers no chills.  The pain is intermittent dull and worsening with Valsalva.  He did have a CT from several months ago I have personally review that at that time did not show a hernia on the anterior ventral aspect showed a well repair hiatal hernia. Has have a history of chronic pain and is being seen by the pain clinic. Recently quit smoking last couple months ago and is currently only vaping as needed  Past Medical History:  Diagnosis Date   Allergy    Calculus of kidney 02/04/2015   COPD (chronic obstructive pulmonary disease) (HCC)    Diabetes mellitus without complication (HCC)    type 2   Hypertension    Neuropathy     Past Surgical History:  Procedure Laterality Date   APPENDECTOMY     COLONOSCOPY WITH PROPOFOL N/A 12/31/2021   Procedure: COLONOSCOPY WITH PROPOFOL;  Surgeon: Wyline Mood, MD;  Location: [facility] ENDOSCOPY;  Service: Gastroenterology;  Laterality: N/A;   ESOPHAGOGASTRODUODENOSCOPY N/A 12/31/2021   Procedure: ESOPHAGOGASTRODUODENOSCOPY (EGD);  Surgeon: Wyline Mood, MD;  Location: [facility] ENDOSCOPY;  Service: Gastroenterology;  Laterality: N/A;   INCISION AND DRAINAGE PERIRECTAL ABSCESS N/A 02/04/2015   Procedure: IRRIGATION AND DEBRIDEMENT PERIRECTAL ABSCESS;  Surgeon: Ida Rogue, MD;  Location: [facility] ORS;  Service: General;  Laterality: N/A;   INSERTION OF MESH  01/20/2022   Procedure: INSERTION OF MESH;  Surgeon: Leafy Ro, MD;  Location: [facility] ORS;  Service: General;;   KIDNEY STONE SURGERY Right     lung mass removal N/A    ORIF ANKLE FRACTURE Left 03/23/2017   Procedure: OPEN REDUCTION INTERNAL FIXATION (ORIF) ANKLE FRACTURE;  Surgeon: Signa Kell, MD;  Location: [facility] ORS;  Service: Orthopedics;  Laterality: Left;   RECTAL EXAM UNDER ANESTHESIA  02/04/2015   Procedure: RECTAL EXAM UNDER ANESTHESIA;  Surgeon: Ida Rogue, MD;  Location: [facility] ORS;  Service: General;;   SYNDESMOSIS REPAIR Left 03/23/2017   Procedure: SYNDESMOSIS REPAIR;  Surgeon: Signa Kell, MD;  Location: [facility] ORS;  Service: Orthopedics;  Laterality: Left;   UMBILICAL HERNIA REPAIR  01/20/2022   Procedure: HERNIA REPAIR UMBILICAL ADULT;  Surgeon: Leafy Ro, MD;  Location: [facility] ORS;  Service: General;;   XI ROBOTIC ASSISTED PARAESOPHAGEAL HERNIA REPAIR N/A 01/20/2022   Procedure: XI ROBOTIC ASSISTED PARAESOPHAGEAL HERNIA REPAIR, CONVERTED TO OPEN, RNFA to assist;  Surgeon: Leafy Ro, MD;  Location: [facility] ORS;  Service: General;  Laterality: N/A;    Family History  Problem Relation Age of Onset   Cancer Mother 50       Lung   Cancer Father        Colon   Heart disease Father    Alcohol abuse Father    Cancer Brother 32       Esophageal   Diabetes Brother    Heart disease Brother     Social History:  reports that he has quit smoking. His smoking use included cigarettes. He has a 17 pack-year smoking history.  He has been exposed to tobacco smoke. He has never used smokeless tobacco. He reports that he does not currently use alcohol after a past usage of about 12.0 standard drinks of alcohol per week. He reports that he does not use drugs.  Allergies:  Allergies  Allergen Reactions   Glipizide Other (See Comments) and Palpitations    Shaky, feel bad Other reaction(s): Dizziness   Cephalexin Rash   Duloxetine Anxiety and Nausea Only   Dupilumab Rash    Medications reviewed.    ROS Full ROS performed and is otherwise negative other than what is stated in HPI   BP (!) 143/77   Pulse  (!) 102   Temp 98.4 F (36.9 C) (Oral)   Ht 6\' 2"  (1.88 m)   Wt 247 lb 9.6 oz (112.3 kg)   SpO2 95%   BMI 31.79 kg/m   Physical Exam Vitals and nursing note reviewed. Exam conducted with a chaperone present.  Constitutional:      Appearance: Normal appearance. He is not diaphoretic.  Cardiovascular:     Rate and Rhythm: Normal rate and regular rhythm.     Heart sounds: No murmur heard. Pulmonary:     Effort: Pulmonary effort is normal.     Breath sounds: Wheezing present. No rhonchi.  Abdominal:     General: There is no distension.     Palpations: There is no mass.     Tenderness: There is no abdominal tenderness. There is no guarding or rebound.     Hernia: A hernia is present.     Comments: 3 cm epigastric hernia mildly tender.  Musculoskeletal:        General: No swelling or tenderness. Normal range of motion.     Cervical back: Normal range of motion and neck supple. No rigidity or tenderness.  Skin:    General: Skin is warm and dry.     Capillary Refill: Capillary refill takes less than 2 seconds.  Neurological:     General: No focal deficit present.     Mental Status: He is alert and oriented to person, place, and time.  Psychiatric:        Mood and Affect: Mood normal.        Behavior: Behavior normal.        Thought Content: Thought content normal.        Judgment: Judgment normal.    Assessment/Plan: 57 year old male with prior open Nissen fundoplication for hiatal hernia.  He now presents with a 3cm incisional hernia.  With the patient in detail since he had prior abdominal operations I do think is prudent to obtain a CT scan of the abdomen and pelvis to evaluate the abdominal wall anatomy and also perform preoperative planning.  As of my assessment clinically I do think that we we will be able to perform an open repair as an outpatient.  Discussed with him this in detail.  He seems to understand.  Please note the spent 30 minutes in this encounter including  personally reviewing imaging studies, coordinating his care, placing orders and performing documentation Sterling Big, MD [facility] General Surgeon

## 2023-03-02 ENCOUNTER — Ambulatory Visit: Payer: 59 | Attending: Pain Medicine | Admitting: Pain Medicine

## 2023-03-02 ENCOUNTER — Ambulatory Visit
Admission: RE | Admit: 2023-03-02 | Discharge: 2023-03-02 | Disposition: A | Discharge status: Home / Self Care | Payer: 59 | Source: Ambulatory Visit | Attending: Pain Medicine | Admitting: Pain Medicine

## 2023-03-02 ENCOUNTER — Encounter: Payer: Self-pay | Admitting: Pain Medicine

## 2023-03-02 VITALS — BP 128/80 | HR 101 | Temp 96.6°F | Resp 16 | Ht 74.0 in | Wt 247.0 lb

## 2023-03-02 DIAGNOSIS — M47816 Spondylosis without myelopathy or radiculopathy, lumbar region: Secondary | ICD-10-CM | POA: Diagnosis present

## 2023-03-02 DIAGNOSIS — G8929 Other chronic pain: Secondary | ICD-10-CM | POA: Diagnosis not present

## 2023-03-02 DIAGNOSIS — M5459 Other low back pain: Secondary | ICD-10-CM | POA: Diagnosis present

## 2023-03-02 DIAGNOSIS — R937 Abnormal findings on diagnostic imaging of other parts of musculoskeletal system: Secondary | ICD-10-CM | POA: Insufficient documentation

## 2023-03-02 DIAGNOSIS — M545 Low back pain, unspecified: Secondary | ICD-10-CM | POA: Insufficient documentation

## 2023-03-02 DIAGNOSIS — M47817 Spondylosis without myelopathy or radiculopathy, lumbosacral region: Secondary | ICD-10-CM | POA: Diagnosis not present

## 2023-03-02 DIAGNOSIS — M5137 Other intervertebral disc degeneration, lumbosacral region with discogenic back pain only: Secondary | ICD-10-CM | POA: Insufficient documentation

## 2023-03-02 MED ORDER — ROPIVACAINE HCL 2 MG/ML IJ SOLN
18.0000 mL | Freq: Once | INTRAMUSCULAR | Status: AC
Start: 2023-03-02 — End: 2023-03-02
  Administered 2023-03-02: 18 mL via PERINEURAL
  Filled 2023-03-02: qty 20

## 2023-03-02 MED ORDER — TRIAMCINOLONE ACETONIDE 40 MG/ML IJ SUSP
80.0000 mg | Freq: Once | INTRAMUSCULAR | Status: AC
  Administered 2023-03-02: 80 mg
  Filled 2023-03-02: qty 2

## 2023-03-02 MED ORDER — PENTAFLUOROPROP-TETRAFLUOROETH EX AERO
INHALATION_SPRAY | Freq: Once | CUTANEOUS | Status: AC
Start: 2023-03-02 — End: 2023-03-02
  Administered 2023-03-02: 30 via TOPICAL

## 2023-03-02 MED ORDER — LIDOCAINE HCL 2 % IJ SOLN
20.0000 mL | Freq: Once | INTRAMUSCULAR | Status: AC
Start: 2023-03-02 — End: 2023-03-02
  Administered 2023-03-02: 400 mg
  Filled 2023-03-02: qty 20

## 2023-03-02 MED ORDER — LACTATED RINGERS IV SOLN: Freq: Once | INTRAVENOUS | Status: AC

## 2023-03-02 MED ORDER — MIDAZOLAM HCL 2 MG/2ML IJ SOLN
0.5000 mg | Freq: Once | INTRAMUSCULAR | Status: AC
  Administered 2023-03-02: 2 mg via INTRAVENOUS
  Filled 2023-03-02: qty 2

## 2023-03-02 NOTE — Progress Notes (Signed)
PROVIDER NOTE: Interpretation of information contained herein should be left to medically-trained personnel. Specific patient instructions are provided elsewhere under "Patient Instructions" section of medical record. This document was created in part using STT-dictation technology, any transcriptional errors that may result from this process are unintentional.  Patient: Darren Allen Type: Established DOB: 1966-02-08 MRN: [id number] PCP: Larae Grooms, NP  Service: Procedure DOS: 03/02/2023 Setting: Ambulatory Location: Ambulatory outpatient facility Delivery: Face-to-face Provider: Oswaldo Done, MD Specialty: Interventional Pain Management Specialty designation: 09 Location: Outpatient facility Ref. Prov.: Larae Grooms, NP       Interventional Therapy   Type: Lumbar Facet, Medial Branch Block(s) (w/ fluoroscopic mapping) #1  Laterality: Bilateral  Level: L2, L3, L4, L5, and S1 Medial Branch Level(s). Injecting these levels blocks the L3-4, L4-5, and L5-S1 lumbar facet joints. Imaging: Fluoroscopic guidance Spinal ([id number]) Anesthesia: Local anesthesia (1-2% Lidocaine) Anxiolysis: IV Versed 2.0 mg Sedation: No Sedation                       DOS: 03/02/2023 Performed by: Oswaldo Done, MD  Primary Purpose: Diagnostic/Therapeutic Indications: Low back pain severe enough to impact quality of life or function. 1. Chronic low back pain (1ry area of Pain) (Bilateral) (R>L) w/o sciatica   2. Degeneration of intervertebral disc of lumbosacral region with discogenic back pain   3. Lumbar facet joint pain   4. Lumbar facet syndrome   5. Spondylosis without myelopathy or radiculopathy, lumbosacral region   6. Abnormal MRI, lumbar spine (03/11/2020)    NAS-11 Pain score:   Pre-procedure: 3 /10   Post-procedure: 0-No pain/10     Position / Prep / Materials:  Position: Prone  Prep solution: ChloraPrep (2% chlorhexidine gluconate and 70% isopropyl alcohol) Area  Prepped: Posterolateral Lumbosacral Spine (Wide prep: From the lower border of the scapula down to the end of the tailbone and from flank to flank.)  Materials:  Tray: Block Needle(s):  Type: Spinal  Gauge (G): 22  Length: 3.5-in Qty: 4      H&P (Pre-op Assessment):  Darren Allen is a 57 y.o. (year old), male patient, seen today for interventional treatment. He  has a past surgical history that includes Appendectomy; Incision and drainage perirectal abscess (N/A, 02/04/2015); Rectal exam under anesthesia (02/04/2015); ORIF ankle fracture (Left, 03/23/2017); Syndesmosis repair (Left, 03/23/2017); Colonoscopy with propofol (N/A, 12/31/2021); Esophagogastroduodenoscopy (N/A, 12/31/2021); Kidney stone surgery (Right); lung mass removal (N/A); Xi robotic assisted paraesophageal hernia repair (N/A, 01/20/2022); Insertion of mesh (01/20/2022); and Umbilical hernia repair (01/20/2022). Darren Allen has a current medication list which includes the following prescription(s): acetaminophen, amitriptyline, ascorbic acid, benzoyl peroxide, breztri aerosphere, dapagliflozin propanediol, epinephrine, ferrous sulfate, fluticasone, gabapentin, hydrochlorothiazide, hydrocodone-acetaminophen, [START ON 03/23/2023] hydrocodone-acetaminophen, [START ON 04/22/2023] hydrocodone-acetaminophen, humulin 70/30 kwikpen, ipratropium-albuterol, lisinopril, magnesium oxide -mg supplement, metformin, montelukast, naloxone, bd safetyglide needle, bd safetyglide needle, nystatin-triamcinolone ointment, ondansetron, proair hfa, rosuvastatin, semaglutide (2 mg/dose), tadalafil, tamsulosin, testosterone cypionate, tretinoin, and triamcinolone cream, and the following Facility-Administered Medications: lactated ringers. His primarily concern today is the Back Pain (lower)  Initial Vital Signs:  Pulse/HCG Rate: (!) 101ECG Heart Rate: 88 Temp: (!) 96.6 F (35.9 C) Resp: 16 BP: (!) 144/91 SpO2: 99 %  BMI: Estimated body mass index is 31.71 kg/m  as calculated from the following:   Height as of this encounter: 6\' 2"  (1.88 m).   Weight as of this encounter: 247 lb (112 kg).  Risk Assessment: Allergies: Reviewed. He is allergic to glipizide, cephalexin, duloxetine, and dupilumab.  Allergy Precautions: None required Coagulopathies: Reviewed. None identified.  Blood-thinner therapy: None at this time Active Infection(s): Reviewed. None identified. Darren Allen is afebrile  Site Confirmation: Darren Allen was asked to confirm the procedure and laterality before marking the site Procedure checklist: Completed Consent: Before the procedure and under the influence of no sedative(s), amnesic(s), or anxiolytics, the patient was informed of the treatment options, risks and possible complications. To fulfill our ethical and legal obligations, as recommended by the American Medical Association's Code of Ethics, I have informed the patient of my clinical impression; the nature and purpose of the treatment or procedure; the risks, benefits, and possible complications of the intervention; the alternatives, including doing nothing; the risk(s) and benefit(s) of the alternative treatment(s) or procedure(s); and the risk(s) and benefit(s) of doing nothing. The patient was provided information about the general risks and possible complications associated with the procedure. These may include, but are not limited to: failure to achieve desired goals, infection, bleeding, organ or nerve damage, allergic reactions, paralysis, and death. In addition, the patient was informed of those risks and complications associated to Spine-related procedures, such as failure to decrease pain; infection (i.e.: Meningitis, epidural or intraspinal abscess); bleeding (i.e.: epidural hematoma, subarachnoid hemorrhage, or any other type of intraspinal or peri-dural bleeding); organ or nerve damage (i.e.: Any type of peripheral nerve, nerve root, or spinal cord injury) with subsequent  damage to sensory, motor, and/or autonomic systems, resulting in permanent pain, numbness, and/or weakness of one or several areas of the body; allergic reactions; (i.e.: anaphylactic reaction); and/or death. Furthermore, the patient was informed of those risks and complications associated with the medications. These include, but are not limited to: allergic reactions (i.e.: anaphylactic or anaphylactoid reaction(s)); adrenal axis suppression; blood sugar elevation that in diabetics may result in ketoacidosis or comma; water retention that in patients with history of congestive heart failure may result in shortness of breath, pulmonary edema, and decompensation with resultant heart failure; weight gain; swelling or edema; medication-induced neural toxicity; particulate matter embolism and blood vessel occlusion with resultant organ, and/or nervous system infarction; and/or aseptic necrosis of one or more joints. Finally, the patient was informed that Medicine is not an exact science; therefore, there is also the possibility of unforeseen or unpredictable risks and/or possible complications that may result in a catastrophic outcome. The patient indicated having understood very clearly. We have given the patient no guarantees and we have made no promises. Enough time was given to the patient to ask questions, all of which were answered to the patient's satisfaction. Mr. Kisiel has indicated that he wanted to continue with the procedure. Attestation: I, the ordering provider, attest that I have discussed with the patient the benefits, risks, side-effects, alternatives, likelihood of achieving goals, and potential problems during recovery for the procedure that I have provided informed consent. Date  Time: 03/02/2023  9:09 AM  Pre-Procedure Preparation:  Monitoring: As per clinic protocol. Respiration, ETCO2, SpO2, BP, heart rate and rhythm monitor placed and checked for adequate function Safety Precautions:  Patient was assessed for positional comfort and pressure points before starting the procedure. Time-out: I initiated and conducted the "Time-out" before starting the procedure, as per protocol. The patient was asked to participate by confirming the accuracy of the "Time Out" information. Verification of the correct person, site, and procedure were performed and confirmed by me, the nursing staff, and the patient. "Time-out" conducted as per Joint Commission's Universal Protocol (UP.01.01.01). Time: 1058 Start Time: 1058 hrs.  Description of  Procedure:          Laterality: (see above) Targeted Levels: (see above)  Safety Precautions: Aspiration looking for blood return was conducted prior to all injections. At no point did we inject any substances, as a needle was being advanced. Before injecting, the patient was told to immediately notify me if he was experiencing any new onset of "ringing in the ears, or metallic taste in the mouth". No attempts were made at seeking any paresthesias. Safe injection practices and needle disposal techniques used. Medications properly checked for expiration dates. SDV (single dose vial) medications used. After the completion of the procedure, all disposable equipment used was discarded in the proper designated medical waste containers. Local Anesthesia: Protocol guidelines were followed. The patient was positioned over the fluoroscopy table. The area was prepped in the usual manner. The time-out was completed. The target area was identified using fluoroscopy. A 12-in long, straight, sterile hemostat was used with fluoroscopic guidance to locate the targets for each level blocked. Once located, the skin was marked with an approved surgical skin marker. Once all sites were marked, the skin (epidermis, dermis, and hypodermis), as well as deeper tissues (fat, connective tissue and muscle) were infiltrated with a small amount of a short-acting local anesthetic, loaded on a 10cc  syringe with a 25G, 1.5-in  Needle. An appropriate amount of time was allowed for local anesthetics to take effect before proceeding to the next step. Local Anesthetic: Lidocaine 2.0% The unused portion of the local anesthetic was discarded in the proper designated containers. Technical description of process:  L2 Medial Branch Nerve Block (MBB): The target area for the L2 medial branch is at the junction of the postero-lateral aspect of the superior articular process and the superior, posterior, and medial edge of the transverse process of L3. Under fluoroscopic guidance, a Quincke needle was inserted until contact was made with os over the superior postero-lateral aspect of the pedicular shadow (target area). After negative aspiration for blood, 0.5 mL of the nerve block solution was injected without difficulty or complication. The needle was removed intact. L3 Medial Branch Nerve Block (MBB): The target area for the L3 medial branch is at the junction of the postero-lateral aspect of the superior articular process and the superior, posterior, and medial edge of the transverse process of L4. Under fluoroscopic guidance, a Quincke needle was inserted until contact was made with os over the superior postero-lateral aspect of the pedicular shadow (target area). After negative aspiration for blood, 0.5 mL of the nerve block solution was injected without difficulty or complication. The needle was removed intact. L4 Medial Branch Nerve Block (MBB): The target area for the L4 medial branch is at the junction of the postero-lateral aspect of the superior articular process and the superior, posterior, and medial edge of the transverse process of L5. Under fluoroscopic guidance, a Quincke needle was inserted until contact was made with os over the superior postero-lateral aspect of the pedicular shadow (target area). After negative aspiration for blood, 0.5 mL of the nerve block solution was injected without difficulty  or complication. The needle was removed intact. L5 Medial Branch Nerve Block (MBB): The target area for the L5 medial branch is at the junction of the postero-lateral aspect of the superior articular process and the superior, posterior, and medial edge of the sacral ala. Under fluoroscopic guidance, a Quincke needle was inserted until contact was made with os over the superior postero-lateral aspect of the pedicular shadow (target area). After negative  aspiration for blood, 0.5 mL of the nerve block solution was injected without difficulty or complication. The needle was removed intact. S1 Medial Branch Nerve Block (MBB): The target area for the S1 medial branch is at the posterior and inferior 6 o'clock position of the L5-S1 facet joint. Under fluoroscopic guidance, the Quincke needle inserted for the L5 MBB was redirected until contact was made with os over the inferior and postero aspect of the sacrum, at the 6 o' clock position under the L5-S1 facet joint (Target area). After negative aspiration for blood, 0.5 mL of the nerve block solution was injected without difficulty or complication. The needle was removed intact.  Once the entire procedure was completed, the treated area was cleaned, making sure to leave some of the prepping solution back to take advantage of its long term bactericidal properties.         Illustration of the posterior view of the lumbar spine and the posterior neural structures. Laminae of L2 through S1 are labeled. DPRL5, dorsal primary ramus of L5; DPRS1, dorsal primary ramus of S1; DPR3, dorsal primary ramus of L3; FJ, facet (zygapophyseal) joint L3-L4; I, inferior articular process of L4; LB1, lateral branch of dorsal primary ramus of L1; IAB, inferior articular branches from L3 medial branch (supplies L4-L5 facet joint); IBP, intermediate branch plexus; MB3, medial branch of dorsal primary ramus of L3; NR3, third lumbar nerve root; S, superior articular process of L5; SAB,  superior articular branches from L4 (supplies L4-5 facet joint also); TP3, transverse process of L3.   Facet Joint Innervation (* possible contribution)  L1-2 T12, L1 (L2*)  Medial Branch  L2-3 L1, L2 (L3*)         "          "  L3-4 L2, L3 (L4*)         "          "  L4-5 L3, L4 (L5*)         "          "  L5-S1 L4, L5, S1          "          "    Vitals:   03/02/23 1100 03/02/23 1105 03/02/23 1111 03/02/23 1116  BP: (!) 129/93 (!) 145/90 (!) 148/98 128/80  Pulse:      Resp: 14 13 14 16   Temp:      TempSrc:      SpO2: 97% 98% 99% 98%  Weight:      Height:         End Time: 1109 hrs.  Imaging Guidance (Spinal):          Type of Imaging Technique: Fluoroscopy Guidance (Spinal) Indication(s): Fluoroscopy guidance for needle placement to enhance accuracy in procedures requiring precise needle localization for targeted delivery of medication in or near specific anatomical locations not easily accessible without such real-time imaging assistance. Exposure Time: Please see nurses notes. Contrast: None used. Fluoroscopic Guidance: I was personally present during the use of fluoroscopy. "Tunnel Vision Technique" used to obtain the best possible view of the target area. Parallax error corrected before commencing the procedure. "Direction-depth-direction" technique used to introduce the needle under continuous pulsed fluoroscopy. Once target was reached, antero-posterior, oblique, and lateral fluoroscopic projection used confirm needle placement in all planes. Images permanently stored in EMR. Interpretation: No contrast injected. I personally interpreted the imaging intraoperatively. Adequate needle placement confirmed in multiple planes. Permanent images saved into the  patient's record.  Post-operative Assessment:  Post-procedure Vital Signs:  Pulse/HCG Rate: (!) 10198 Temp: (!) 96.6 F (35.9 C) Resp: 16 BP: 128/80 SpO2: 98 %  EBL: None  Complications: No immediate post-treatment  complications observed by team, or reported by patient.  Note: The patient tolerated the entire procedure well. A repeat set of vitals were taken after the procedure and the patient was kept under observation following institutional policy, for this type of procedure. Post-procedural neurological assessment was performed, showing return to baseline, prior to discharge. The patient was provided with post-procedure discharge instructions, including a section on how to identify potential problems. Should any problems arise concerning this procedure, the patient was given instructions to immediately contact us, at any time, without hesitation. In any case, we plan to contact the patient by telephone for a follow-up status report regarding this interventional procedure.  Comments:  No additional relevant information.  Plan of Care (POC)  Orders:  Orders Placed This Encounter  Procedures   LUMBAR FACET(MEDIAL BRANCH NERVE BLOCK) MBNB    Scheduling Instructions:     Procedure: Lumbar facet block (AKA.: Lumbosacral medial branch nerve block)     Side: Bilateral     Level: L3-4, L4-5, L5-S1, and TBD Facets (L2, L3, L4, L5, S1, and TBD Medial Branch Nerves)     Sedation: Patient's choice.     Timeframe: Today    Where will this procedure be performed?:   [facility]   DG [facility] C-ARM 1-60 MIN NO REPORT    Intraoperative interpretation by procedural physician at [facility].    Standing Status:   Standing    Number of Occurrences:   1    Reason for exam::   Assistance in needle guidance and placement for procedures requiring needle placement in or near specific anatomical locations not easily accessible without such assistance.   Informed Consent Details: Physician/Practitioner Attestation; Transcribe to consent form and obtain patient signature    Nursing Order: Transcribe to consent form and obtain patient signature. Note: Always confirm laterality of pain with Mr. Schielke,  before procedure.    Physician/Practitioner attestation of informed consent for procedure/surgical case:   I, the physician/practitioner, attest that I have discussed with the patient the benefits, risks, side effects, alternatives, likelihood of achieving goals and potential problems during recovery for the procedure that I have provided informed consent.    Procedure:   Lumbar Facet Block  under fluoroscopic guidance    Physician/Practitioner performing the procedure:   Kenderick Kobler A. Laban Emperor MD    Indication/Reason:   Low Back Pain, with our without leg pain, due to Facet Joint Arthralgia (Joint Pain) Spondylosis (Arthritis of the Spine), without myelopathy or radiculopathy (Nerve Damage).   Provide equipment / supplies at bedside    Procedure tray: "Block Tray" (Disposable  single use) Skin infiltration needle: Regular 1.5-in, 25-G, (x1) Block Needle type: Spinal Amount/quantity: 4 Size: Medium (5-inch) Gauge: 22G    Standing Status:   Standing    Number of Occurrences:   1    Specify:   Block Tray   Chronic Opioid Analgesic:   Hydrocodone/APAP 5/325 tablet, 1 tab p.o. twice daily (#60) MME/day: 10 mg/day   Medications ordered for procedure: Meds ordered this encounter  Medications   lidocaine (XYLOCAINE) 2 % (with pres) injection 400 mg   pentafluoroprop-tetrafluoroeth (GEBAUERS) aerosol   lactated ringers infusion   midazolam (VERSED) injection 0.5-2 mg    Make sure Flumazenil is available in the pyxis when using  this medication. If oversedation occurs, administer 0.2 mg IV over 15 sec. If after 45 sec no response, administer 0.2 mg again over 1 min; may repeat at 1 min intervals; not to exceed 4 doses (1 mg)   ropivacaine (PF) 2 mg/mL (0.2%) (NAROPIN) injection 18 mL   triamcinolone acetonide (KENALOG-40) injection 80 mg   Medications administered: We administered lidocaine, pentafluoroprop-tetrafluoroeth, lactated ringers, midazolam, ropivacaine (PF) 2 mg/mL (0.2%), and  triamcinolone acetonide.  See the medical record for exact dosing, route, and time of administration.  Follow-up plan:   Return in about 2 weeks (around 03/16/2023) for (Face2F), (PPE).       Interventional Therapies  Risk Factors  Considerations:   WNL   Planned  Pending:   Therapeutic right L5-S1 LESI #1    Under consideration:   Diagnostic bilateral lumbar facet MBB #1  Diagnostic/therapeutic left L5 & S1 TFESI #1  Possible spinal cord stimulator trial  Therapeutic left L5-S1 percutaneous discectomy with "[employer] Dekompressor" system    Completed:   Diagnostic midline to left caudal ESI x1 (04/02/2022) (100/100/100/LBP:100/LEP:100)  Therapeutic left L5-S1 LESI x3 (08/27/2022) (1st:100/100/100/LBP:85  LEP:100) (2nd: 01[birth year]13/59/LBP:60LLEP:100)  Therapeutic right L5-S1 LESI x1 (12/24/2022) (100/85/100/LBP:100)  Therapeutic bilateral Qutenza neurolytic treatment x1 (12/23/2021)  (09/08/2021 & 10/29/2021) referral to physical therapy for evaluation and treatment of low back pain.   Completed by other providers:   EMG/PNCV of lower extremity (02/20/2020) by Dr. Cristopher Peru (generalized sensorimotor peripheral neuropathy; superimposed left S1 radiculopathy)   Therapeutic  Palliative (PRN) options:   None established      Recent Visits Date Type Provider Dept  02/17/23 Office Visit Delano Metz, MD [facility]  01/07/23 Office Visit Delano Metz, MD [facility]  12/24/22 Procedure visit Delano Metz, MD [facility]  12/23/22 Office Visit Delano Metz, MD [facility]  Showing recent visits within past 90 days and meeting all other requirements Today's Visits Date Type Provider Dept  03/02/23 Procedure visit Delano Metz, MD [facility]  Showing today's visits and meeting all other requirements Future Appointments Date Type Provider Dept  03/18/23 Appointment Delano Metz, MD [facility]  05/19/23 Appointment Delano Metz, MD [facility]  Showing future appointments within next 90 days and meeting all other requirements  Disposition: Discharge home  Discharge (Date  Time): 03/02/2023;   hrs.   Primary Care Physician: Larae Grooms, NP Location: [facility] Note by: Oswaldo Done, MD (TTS technology used. I apologize for any typographical errors that were not detected and corrected.) Date: 03/02/2023; Time: 11:21 AM  Disclaimer:  Medicine is not an Visual merchandiser. The only guarantee in medicine is that nothing is guaranteed. It is important to note that the decision to proceed with this intervention was based on the information collected from the patient. The Data and conclusions were drawn from the patient's questionnaire, the interview, and the physical examination. Because the information was provided in large part by the patient, it cannot be guaranteed that it has not been purposely or unconsciously manipulated. Every effort has been made to obtain as much relevant data as possible for this evaluation. It is important to note that the conclusions that lead to this procedure are derived in large part from the available data. Always take into account that the treatment will also be dependent on availability of resources and existing treatment guidelines, considered by other Pain Management Practitioners as being common knowledge and practice, at the time of the  intervention. For Medico-Legal purposes, it is also important to point out that variation in procedural techniques and pharmacological choices are the acceptable norm. The indications, contraindications, technique, and results of the above procedure should only be interpreted and judged by a Board-Certified Interventional Pain Specialist with extensive familiarity and expertise in the same exact procedure and technique.

## 2023-03-02 NOTE — Patient Instructions (Signed)

## 2023-03-03 ENCOUNTER — Telehealth: Payer: Self-pay | Admitting: *Deleted

## 2023-03-04 ENCOUNTER — Ambulatory Visit
Admission: RE | Admit: 2023-03-04 | Discharge: 2023-03-04 | Disposition: A | Discharge status: Home / Self Care | Payer: 59 | Source: Ambulatory Visit | Attending: Surgery | Admitting: Surgery

## 2023-03-04 DIAGNOSIS — R1033 Periumbilical pain: Secondary | ICD-10-CM | POA: Insufficient documentation

## 2023-03-04 MED ORDER — IOHEXOL 300 MG/ML  SOLN
100.0000 mL | Freq: Once | INTRAMUSCULAR | Status: AC | PRN
  Administered 2023-03-04: 100 mL via INTRAVENOUS

## 2023-03-18 ENCOUNTER — Ambulatory Visit: Payer: 59 | Attending: Pain Medicine | Admitting: Pain Medicine

## 2023-03-18 ENCOUNTER — Encounter: Payer: Self-pay | Admitting: Pain Medicine

## 2023-03-18 VITALS — BP 144/88 | HR 110 | Temp 98.4°F | Resp 16 | Ht 74.0 in | Wt 245.0 lb

## 2023-03-18 DIAGNOSIS — M5459 Other low back pain: Secondary | ICD-10-CM | POA: Diagnosis not present

## 2023-03-18 DIAGNOSIS — M47816 Spondylosis without myelopathy or radiculopathy, lumbar region: Secondary | ICD-10-CM | POA: Diagnosis not present

## 2023-03-18 DIAGNOSIS — Z794 Long term (current) use of insulin: Secondary | ICD-10-CM | POA: Insufficient documentation

## 2023-03-18 DIAGNOSIS — M545 Low back pain, unspecified: Secondary | ICD-10-CM | POA: Insufficient documentation

## 2023-03-18 DIAGNOSIS — E1140 Type 2 diabetes mellitus with diabetic neuropathy, unspecified: Secondary | ICD-10-CM | POA: Insufficient documentation

## 2023-03-18 DIAGNOSIS — G8929 Other chronic pain: Secondary | ICD-10-CM | POA: Diagnosis present

## 2023-03-18 DIAGNOSIS — Z09 Encounter for follow-up examination after completed treatment for conditions other than malignant neoplasm: Secondary | ICD-10-CM | POA: Insufficient documentation

## 2023-03-18 NOTE — Progress Notes (Signed)
 PROVIDER NOTE: Information contained herein reflects review and annotations entered in association with encounter. Interpretation of such information and data should be left to medically-trained personnel. Information provided to patient can be located elsewhere in the medical record under Patient Instructions. Document created using STT-dictation technology, any transcriptional errors that may result from process are unintentional.    Patient: Darren Allen  Service Category: E/M  Provider: Eric DELENA Como, MD  DOB: [birth year]  DOS: 03/18/2023  Referring Provider: Melvin Pao, NP  MRN: [id number]  Specialty: Interventional Pain Management  PCP: Melvin Pao, NP  Type: Established Patient  Setting: Ambulatory outpatient    Location: Office  Delivery: Face-to-face     HPI  [name], a 58 y.o. year old male, is here today because of his Chronic bilateral low back pain without sciatica [M54.50, G89.29]. [name] primary complain today is Back Pain, Leg Pain (B/l), and Foot Pain (B/l)  Pertinent problems: [name] has Ankle fracture; Neuropathy; Diabetic peripheral neuropathy (HCC); Gout; Chronic ankle pain (Bilateral); Chronic low back pain (1ry area of Pain) (Bilateral) (R>L) w/o sciatica; Other acquired hammer toe; Chronic pain syndrome; Lumbar facet syndrome; Lumbosacral radiculopathy at S1 (Left); Chronic feet pain (2ry area of Pain) (Bilateral); Chronic ankle pain (Left); Abnormal NCS (nerve conduction studies) (02/20/2020); Abnormal MRI, lumbar spine (03/11/2020); DDD (degenerative disc disease), lumbosacral; Lumbosacral lateral recess stenosis (Left: L5-S1); Chronic low back pain (Bilateral) w/ sciatica (Left); Abnormal MRI, thoracic spine (07/05/2020); Chronic low back pain (Bilateral) w/ sciatica (Right); Chronic lower extremity pain (intermittent) (Right); Chronic lower extremity pain (intermittent) (Left); Lumbar radiculitis (Right); Lumbar radiculitis (Left);  Lumbar facet joint pain; and Spondylosis without myelopathy or radiculopathy, lumbosacral region on their pertinent problem list. Pain Assessment: Severity of Chronic pain is reported as a 0-No pain/10. Location: Back Lower/Denies. Onset: More than a month ago. Quality: Aching. Timing: Intermittent. Modifying factor(s): Procedures and rest. Vitals:  height is 6' 2 (1.88 m) and weight is 245 lb (111.1 kg). His temporal temperature is 98.4 F (36.9 C). His blood pressure is 144/88 (abnormal) and his pulse is 110 (abnormal). His respiration is 16 and oxygen  saturation is 99%.  BMI: Estimated body mass index is 31.46 kg/m as calculated from the following:   Height as of this encounter: 6' 2 (1.88 m).   Weight as of this encounter: 245 lb (111.1 kg). Last encounter: 02/17/2023. Last procedure: 03/02/2023.  Reason for encounter: post-procedure evaluation and assessment.  Discussed the use of AI scribe software for clinical note transcription with the patient, who gave verbal consent to proceed.  History of Present Illness   The patient, with a history of chronic back pain, underwent a diagnostic bilateral lumbar facet block on 03/02/2023, blocking the L2 through S1 medial branches bilaterally to effectively block the L3-4, L4-5, and L5-S1 lumbar facet joints. The patient reported significant pain relief while the local anesthetic was in effect, which lasted approximately 4-5 hours. This suggests that the source of the patient's back pain was successfully targeted during the procedure.  However, the patient experienced a significant increase in blood sugar levels following the procedure, with readings ranging from 400 to 600. The patient, who has a history of diabetes and is on insulin  therapy, managed to bring his blood sugar levels back to normal over time. He takes Ozempic and Metformin  for his diabetes and has been on insulin  for about three years. He reported that his blood sugar levels have been  running low at night, but this pattern  changed after the procedure.  The patient also reported that he has been experiencing uncontrollable sugar levels, which he attributes to the steroids used during the procedure. He expressed concern about the impact of these fluctuations on his overall health and diabetes management.      Post-procedure evaluation   Type: Lumbar Facet, Medial Branch Block(s) (w/ fluoroscopic mapping) #1  Laterality: Bilateral  Level: L2, L3, L4, L5, and S1 Medial Branch Level(s). Injecting these levels blocks the L3-4, L4-5, and L5-S1 lumbar facet joints. Imaging: Fluoroscopic guidance Spinal ([id number]) Anesthesia: Local anesthesia (1-2% Lidocaine ) Anxiolysis: IV Versed  2.0 mg Sedation: No Sedation                       DOS: 03/02/2023 Performed by: Eric DELENA Como, MD  Primary Purpose: Diagnostic/Therapeutic Indications: Low back pain severe enough to impact quality of life or function. 1. Chronic low back pain (1ry area of Pain) (Bilateral) (R>L) w/o sciatica   2. Degeneration of intervertebral disc of lumbosacral region with discogenic back pain   3. Lumbar facet joint pain   4. Lumbar facet syndrome   5. Spondylosis without myelopathy or radiculopathy, lumbosacral region   6. Abnormal MRI, lumbar spine (03/11/2020)    NAS-11 Pain score:   Pre-procedure: 3 /10   Post-procedure: 0-No pain/10     Effectiveness:  Initial hour after procedure: 100 %. Subsequent 4-6 hours post-procedure: 100 %. Analgesia past initial 6 hours: 95 %. Ongoing improvement:  Analgesic: The patient indicates currently enjoying an ongoing 100% relief of his low back pain.  His blood sugar did go up considerably with the steroids.  He happens not to have a sliding scale for his insulin  despite the fact that he has been on it for about 2 years.  I have recommended that he go and talk to his primary care physician and/your endocrinologist so that he can be provided with a sliding  scale. Function: [name] reports improvement in function ROM: [name] reports improvement in ROM   Pharmacotherapy Assessment  Analgesic: Hydrocodone /APAP 5/325 tablet, 1 tab p.o. twice daily (#60) MME/day: 10 mg/day   Monitoring: [place] PMP: PDMP reviewed during this encounter.       Pharmacotherapy: No side-effects or adverse reactions reported. Compliance: No problems identified. Effectiveness: Clinically acceptable.  Dayna Pulling, RN  03/18/2023 10:44 AM  Sign when Signing Visit Safety precautions to be maintained throughout the outpatient stay will include: orient to surroundings, keep bed in low position, maintain call bell within reach at all times, provide assistance with transfer out of bed and ambulation.     No results found for: CBDTHCR No results found for: D8THCCBX No results found for: D9THCCBX  UDS:  Summary  Date Value Ref Range Status  08/24/2022 Note  Final    Comment:    ==================================================================== [facility] (MW) ==================================================================== Test                             Result       Flag       Units  Drug Present and Declared for Prescription Verification   Hydrocodone                     312          EXPECTED   ng/mg creat   Norhydrocodone                 398  EXPECTED   ng/mg creat    Sources of hydrocodone  include scheduled prescription medications.    Norhydrocodone is an expected metabolite of hydrocodone .  ==================================================================== Test                      Result    Flag   Units      Ref Range   Creatinine              161              mg/dL      >=79 ==================================================================== Declared Medications:  The flagging and interpretation on this report are based on the  following declared medications.  Unexpected results may arise from  inaccuracies in the  declared medications.   **Note: The testing scope of this panel includes these medications:   Hydrocodone  (Norco)   **Note: The testing scope of this panel does not include the  following reported medications:   Acetaminophen  (Tylenol )  Acetaminophen  (Norco)  Albuterol  (Proair  HFA)  Albuterol  (Duoneb)  Amitriptyline  (Elavil )  Epinephrine  (EpiPen )  Fluticasone  (Trelegy)  Fluticasone  (Flonase )  Gabapentin  (Neurontin )  Hydrochlorothiazide  (Hydrodiuril )  Insulin  (Humulin)  Ipratropium (Duoneb)  Iron  Lisinopril  (Zestril )  Magnesium  (Mag-Ox)  Meloxicam  (Mobic )  Metformin  (Glucophage )  Montelukast  (Singulair )  Naloxone  (Narcan )  Ondansetron  (Zofran )  Prednisone  (Deltasone )  Rosuvastatin  (Crestor )  Semaglutide  Sulfamethoxazole  (Bactrim )  Tadalafil  (Cialis )  Tamsulosin  (Flomax )  Testosterone   Triamcinolone  (Kenalog )  Trimethoprim  (Bactrim )  Umeclidinium (Trelegy)  Vilanterol (Trelegy)  Vitamin C ==================================================================== For clinical consultation, please call [phone]. ====================================================================       ROS  Constitutional: Denies any fever or chills Gastrointestinal: No reported hemesis, hematochezia, vomiting, or acute GI distress Musculoskeletal: Denies any acute onset joint swelling, redness, loss of ROM, or weakness Neurological: No reported episodes of acute onset apraxia, aphasia, dysarthria, agnosia, amnesia, paralysis, loss of coordination, or loss of consciousness  Medication Review  Budeson-Glycopyrrol-Formoterol , EPINEPHrine , HYDROcodone -acetaminophen , Magnesium  Oxide -Mg Supplement, NEEDLE (DISP) 18 G, NEEDLE (DISP) 21 G, Semaglutide (2 MG/DOSE), acetaminophen , albuterol , amitriptyline , ascorbic acid, benzoyl peroxide , dapagliflozin  propanediol, ferrous sulfate , fluticasone , gabapentin , hydrochlorothiazide , insulin  isophane & regular human KwikPen,  ipratropium-albuterol , lisinopril , metFORMIN , montelukast , naloxone , nystatin -triamcinolone  ointment, ondansetron , rosuvastatin , tadalafil , tamsulosin , testosterone  cypionate, tretinoin , and triamcinolone  cream  History Review  Allergy: Mr. Arvidson is allergic to glipizide, cephalexin , duloxetine, and dupilumab. Drug: Mr. Iodice  reports no history of drug use. Alcohol:  reports that he does not currently use alcohol after a past usage of about 12.0 standard drinks of alcohol per week. Tobacco:  reports that he has quit smoking. His smoking use included cigarettes. He has a 17 pack-year smoking history. He has been exposed to tobacco smoke. He has never used smokeless tobacco. Social: Mr. Visconti  reports that he has quit smoking. His smoking use included cigarettes. He has a 17 pack-year smoking history. He has been exposed to tobacco smoke. He has never used smokeless tobacco. He reports that he does not currently use alcohol after a past usage of about 12.0 standard drinks of alcohol per week. He reports that he does not use drugs. Medical:  has a past medical history of Allergy, Calculus of kidney (02/04/2015), COPD (chronic obstructive pulmonary disease) (HCC), Diabetes mellitus without complication (HCC), Hypertension, and Neuropathy. Surgical: Mr. Liew  has a past surgical history that includes Appendectomy; Incision and drainage perirectal abscess (N/A, 02/04/2015); Rectal exam under anesthesia (02/04/2015); ORIF ankle fracture (Left, 03/23/2017); Syndesmosis repair (Left, 03/23/2017); Colonoscopy with propofol  (N/A, 12/31/2021); Esophagogastroduodenoscopy (N/A,  12/31/2021); Kidney stone surgery (Right); lung mass removal (N/A); Xi robotic assisted paraesophageal hernia repair (N/A, 01/20/2022); Insertion of mesh (01/20/2022); and Umbilical hernia repair (01/20/2022). Family: family history includes Alcohol abuse in his father; Cancer in his father; Cancer (age of onset: 5) in his brother; Cancer  (age of onset: 51) in his mother; Diabetes in his brother; Heart disease in his brother and father.  Laboratory Chemistry Profile   Renal Lab Results  Component Value Date   BUN 13 02/25/2023   CREATININE 1.15 02/25/2023   BCR 11 02/25/2023   GFRAA 59 (L) 12/07/2019   GFRNONAA >60 02/15/2022    Hepatic Lab Results  Component Value Date   AST 34 02/25/2023   ALT 39 02/25/2023   ALBUMIN  4.2 02/25/2023   ALKPHOS 59 02/25/2023   AMYLASE 93 02/04/2022   LIPASE 115 (H) 02/04/2022    Electrolytes Lab Results  Component Value Date   NA 137 02/25/2023   K 3.5 02/25/2023   CL 96 02/25/2023   CALCIUM  9.8 02/25/2023   MG 1.8 01/25/2022   PHOS 2.3 (L) 01/23/2022    Bone Lab Results  Component Value Date   25OHVITD1 44 09/08/2021   25OHVITD2 <1.0 09/08/2021   25OHVITD3 43 09/08/2021   TESTOSTERONE  575 02/23/2023    Inflammation (CRP: Acute Phase) (ESR: Chronic Phase) Lab Results  Component Value Date   CRP 1.8 (H) 09/08/2021   ESRSEDRATE 17 09/08/2021   LATICACIDVEN 1.6 02/03/2022         Note: Above Lab results reviewed.  Recent Imaging Review  CT ABDOMEN PELVIS W CONTRAST CLINICAL DATA:  Evaluate for incisional hernia.  EXAM: CT ABDOMEN AND PELVIS WITH CONTRAST  TECHNIQUE: Multidetector CT imaging of the abdomen and pelvis was performed using the standard protocol following bolus administration of intravenous contrast.  RADIATION DOSE REDUCTION: This exam was performed according to the departmental dose-optimization program which includes automated exposure control, adjustment of the mA and/or kV according to patient size and/or use of iterative reconstruction technique.  CONTRAST:  OMNIPAQUE  IOHEXOL  300 MG/ML  SOLN  COMPARISON:  CT February 03, 2022  FINDINGS: Lower chest: No acute abnormality. Postsurgical change about the gastroesophageal junction with largely resolved fluid collection now measuring 12 x 10 mm on image 22/2 previously 16.3 x 3.1  cm.  Hepatobiliary: Diffuse hepatic steatosis. No suspicious hepatic lesion. Gallbladder is unremarkable. No biliary ductal dilation.  Pancreas: No pancreatic ductal dilation or evidence of acute inflammation.  Spleen: No splenomegaly.  Adrenals/Urinary Tract: Bilateral adrenal glands appear normal. No hydronephrosis. Kidneys demonstrate symmetric enhancement. Urinary bladder is unremarkable for degree of distension.  Stomach/Bowel: No radiopaque enteric contrast material was administered. No pathologic dilation of small or large bowel. Colonic diverticulosis without findings of acute diverticulitis.  Vascular/Lymphatic: Aortic atherosclerosis. Smooth IVC contours. The portal, splenic and superior mesenteric veins are patent. No pathologically enlarged abdominal or pelvic lymph nodes.  Reproductive: Prostate is unremarkable.  Other: Small ventral hernia contains fat with a 13 mm aperture width. Subcutaneous nodular stranding in the anterior abdominal wall may reflects sequela of subcutaneous injections  Musculoskeletal: No acute osseous abnormality.  IMPRESSION: 1. Small ventral hernia contains fat with a 13 mm aperture width. 2. Postsurgical change about the gastroesophageal junction with largely resolved fluid collection now measuring 12 x 10 mm previously 16.3 x 3.1 cm. 3. Diffuse hepatic steatosis. 4. Colonic diverticulosis without findings of acute diverticulitis. 5.  Aortic Atherosclerosis (ICD10-I70.0).  Electronically Signed   By: Reyes Raymondo HERO.D.  On: 03/07/2023 07:49 Note: Reviewed        Physical Exam  General appearance: Well nourished, well developed, and well hydrated. In no apparent acute distress Mental status: Alert, oriented x 3 (person, place, & time)       Respiratory: No evidence of acute respiratory distress Eyes: PERLA Vitals: BP (!) 144/88 (BP Location: Right Arm, Patient Position: Sitting, Cuff Size: Normal)   Pulse (!) 110   Temp 98.4  F (36.9 C) (Temporal)   Resp 16   Ht 6' 2 (1.88 m)   Wt 245 lb (111.1 kg)   SpO2 99%   BMI 31.46 kg/m  BMI: Estimated body mass index is 31.46 kg/m as calculated from the following:   Height as of this encounter: 6' 2 (1.88 m).   Weight as of this encounter: 245 lb (111.1 kg). Ideal: Ideal body weight: 82.2 kg (181 lb 3.5 oz) Adjusted ideal body weight: 93.8 kg (206 lb 11.7 oz)  Assessment   Diagnosis Status  1. Chronic low back pain (1ry area of Pain) (Bilateral) (R>L) w/o sciatica   2. Lumbar facet joint pain   3. Lumbar facet syndrome   4. Postop check   5. Type 2 diabetes mellitus with diabetic neuropathy, with long-term current use of insulin  (HCC)    Resolved Resolved Resolved   Updated Problems: Problem  Type 2 Diabetes Mellitus With Diabetic Neuropathy, With Long-Term Current Use of Insulin  (Hcc)    Plan of Care  Problem-specific:  Assessment and Plan    Lumbar Facet Joint Arthritis Diagnostic bilateral lumbar facet block on 03/02/2023, blocking L2 through S1 medial branches bilaterally, confirmed Lumbar Facet Joint Arthritis as the pain source, with significant relief while numb indicating steroid effectiveness in reducing inflammation. We discussed long-term management including radiofrequency ablation for longer-lasting pain relief of 3-18 months and advised against heavy lifting and twisting at the waist to prevent symptom exacerbation. We will provide information on NIH-verified over-the-counter supplements to reduce inflammation without affecting blood sugar levels. No surgical options are available for facet problems, but radiofrequency ablation can be repeated as needed. We plan for a second diagnostic injection when pain returns and will consider radiofrequency ablation for long-term pain management.  Type 2 Diabetes Mellitus His Type 2 Diabetes Mellitus is managed with insulin , Ozempic, and metformin . He reported significant hyperglycemia (400-600 mg/dL)  post-steroid injection, now normalized. We emphasized the importance of closely monitoring blood sugar levels and adjusting insulin  as needed. We advised consulting his primary care physician for a sliding scale insulin  regimen to manage blood sugar fluctuations and informed him that blood sugar increases from steroids last about two weeks, and adjustments should be temporary to avoid hypoglycemia. We also advised avoiding frequent steroid use due to its impact on blood sugar levels.  General Health Maintenance We advised on strategies to manage conditions effectively, including avoiding weight gain to reduce stress on the spine and consulting his primary care physician before taking any new supplements.  Follow-up He should call the clinic when pain returns to schedule the second diagnostic injection.       Mr. KODI STEIL has a current medication list which includes the following long-term medication(s): ferrous sulfate , fluticasone , hydrochlorothiazide , hydrocodone -acetaminophen , [START ON 03/23/2023] hydrocodone -acetaminophen , [START ON 04/22/2023] hydrocodone -acetaminophen , humulin 70/30 kwikpen, lisinopril , metformin , montelukast , rosuvastatin , tadalafil , and testosterone  cypionate.  Pharmacotherapy (Medications Ordered): No orders of the defined types were placed in this encounter.  Orders:  Orders Placed This Encounter  Procedures   Nursing Instructions:  Please complete this patient's postprocedure evaluation.    Scheduling Instructions:     Please complete this patient's postprocedure evaluation.   Follow-up plan:   Return if symptoms worsen or fail to improve, for (PRN) (B) L-FCT Blk #2 (without steroids).      Interventional Therapies  Risk Factors  Considerations:   WNL   Planned  Pending:      Under consideration:   Diagnostic bilateral lumbar facet MBB #2 (without steroids) (PRN) Therapeutic right L5-S1 LESI #2  Possible spinal cord stimulator trial   Therapeutic left L5-S1 percutaneous discectomy with [employer] Dekompressor system    Completed:   Diagnostic bilateral lumbar facet MBB x1 (03/02/2023) (100/100/95/100)  Diagnostic midline to left caudal ESI x1 (04/02/2022) (100/100/100/LBP:100/LEP:100)  Therapeutic left L5-S1 LESI x3 (08/27/2022) (1st:100/100/100/LBP:85  LEP:100) (2nd: 01[birth year]13/59/LBP:60LLEP:100)  Therapeutic right L5-S1 LESI x1 (12/24/2022) (100/85/100/LBP:100)  Therapeutic bilateral Qutenza  neurolytic treatment x1 (12/23/2021)  (09/08/2021 & 10/29/2021) referral to physical therapy for evaluation and treatment of low back pain.   Completed by other providers:   EMG/PNCV of lower extremity (02/20/2020) by Dr. Jannett Fairly (generalized sensorimotor peripheral neuropathy; superimposed left S1 radiculopathy)   Therapeutic  Palliative (PRN) options:   Diagnostic bilateral lumbar facet MBB #2 (without steroids) (PRN)      Recent Visits Date Type Provider Dept  03/02/23 Procedure visit Tanya Glisson, MD [facility]  02/17/23 Office Visit Tanya Glisson, MD [facility]  01/07/23 Office Visit Tanya Glisson, MD [facility]  12/24/22 Procedure visit Tanya Glisson, MD [facility]  12/23/22 Office Visit Tanya Glisson, MD [facility]  Showing recent visits within past 90 days and meeting all other requirements Today's Visits Date Type Provider Dept  03/18/23 Office Visit Tanya Glisson, MD [facility]  Showing today's visits and meeting all other requirements Future Appointments Date Type Provider Dept  05/19/23 Appointment Tanya Glisson, MD [facility]  Showing future appointments within next 90 days and meeting all other requirements  I discussed the assessment and treatment plan with the patient. The patient was provided an opportunity to ask questions and all were answered. The patient agreed with the plan and demonstrated  an understanding of the instructions.  Patient advised to call back or seek an in-person evaluation if the symptoms or condition worsens.  Duration of encounter: 30 minutes.  Total time on encounter, as per AMA guidelines included both the face-to-face and non-face-to-face time personally spent by the physician and/or other qualified health care professional(s) on the day of the encounter (includes time in activities that require the physician or other qualified health care professional and does not include time in activities normally performed by clinical staff). Physician's time may include the following activities when performed: Preparing to see the patient (e.g., pre-charting review of records, searching for previously ordered imaging, lab work, and nerve conduction tests) Review of prior analgesic pharmacotherapies. Reviewing PMP Interpreting ordered tests (e.g., lab work, imaging, nerve conduction tests) Performing post-procedure evaluations, including interpretation of diagnostic procedures Obtaining and/or reviewing separately obtained history Performing a medically appropriate examination and/or evaluation Counseling and educating the patient/family/caregiver Ordering medications, tests, or procedures Referring and communicating with other health care professionals (when not separately reported) Documenting clinical information in the electronic or other health record Independently interpreting results (not separately reported) and communicating results to the patient/ family/caregiver Care coordination (not separately reported)  Note by: Glisson DELENA Tanya, MD Date: 03/18/2023; Time: 11:13 AM

## 2023-03-18 NOTE — Patient Instructions (Signed)
 ______________________________________________________________________    OTC Supplements:   The following is a list of over-the-counter (OTC) supplements that have been found to have [employer]) studies suggesting that they may be of some benefits when used in moderation in some chronic pain-related conditions.  NOTE:  Always consult with your primary care provider and/or pharmacist before taking any OTC medications to make sure they will not interact with your current medications. Always use manufacturer's recommended dosage.  Supplement Possible benefit May be of benefit in treatment of   Turmeric/curcumin anti-inflammatory Joint and muscle aches and pain.  Glucosamine/chondroitin (triple strength) may slow loss of articular cartilage Joint pain.  Vitamin D-3* may suppress release of chemicals associated with inflammation. Increases tolerance to pain. Joint and muscle aches and pain.   Moringa(+) anti-inflammatory with mild analgesic effects Joint and muscle aches and pain.  Melatonin(+) Helps reset sleep cycle. Insomnia.  Vitamin B-12* may help keep nerves and blood cells healthy as well as maintaining function of nervous system Nerve pain (Burning pain)  Alpha-Lipoic-Acid (ALA)* antioxidant that may help with nerve health, pain, and blocking the activation of some inflammatory chemicals Diabetic neuropathy and metabolic syndrome  superoxide dismutase (SOD)** Currently being reviewed.   Tiger Balm Currently being reviewed.   hydrolyzed collagen peptides* Currently being reviewed.  Collagen supplementation may increases bone strength, density, and mass; may improve joint stiffness/mobility, and functionality; and may reduce joint pain. Possible chondroprotective effects. May help with protection of joint health.   Methylsulfonylmethane (MSM)* Currently being reviewed.   CBD(+) Currently being reviewed.   Delta-8 THC(+) Currently being reviewed.   *  Generally  Recognized As Safe (GRAS) approved substance.-FDA ([employer]) ** Possibly Safe, but not considered Generally Recognized As Safe (Not GRAS) by the [place]  Food and Drug Administration (FDA) as a food additive. (+) Not considered Generally Recognized As Safe (Not GRAS) by the [place]  Food and Drug Administration (FDA) as a food additive.  ______________________________________________________________________

## 2023-03-18 NOTE — Progress Notes (Signed)
 Safety precautions to be maintained throughout the outpatient stay will include: orient to surroundings, keep bed in low position, maintain call bell within reach at all times, provide assistance with transfer out of bed and ambulation.

## 2023-03-29 ENCOUNTER — Encounter: Payer: Self-pay | Admitting: Surgery

## 2023-03-29 ENCOUNTER — Ambulatory Visit (INDEPENDENT_AMBULATORY_CARE_PROVIDER_SITE_OTHER): Payer: 59 | Admitting: Surgery

## 2023-03-29 ENCOUNTER — Ambulatory Visit (INDEPENDENT_AMBULATORY_CARE_PROVIDER_SITE_OTHER): Payer: 59 | Admitting: Nurse Practitioner

## 2023-03-29 ENCOUNTER — Encounter: Payer: Self-pay | Admitting: Nurse Practitioner

## 2023-03-29 VITALS — BP 111/76 | HR 109 | Temp 98.5°F | Ht 74.0 in | Wt 247.2 lb

## 2023-03-29 VITALS — BP 147/80 | HR 116 | Temp 98.7°F | Ht 74.0 in | Wt 245.4 lb

## 2023-03-29 DIAGNOSIS — I152 Hypertension secondary to endocrine disorders: Secondary | ICD-10-CM | POA: Diagnosis not present

## 2023-03-29 DIAGNOSIS — K432 Incisional hernia without obstruction or gangrene: Secondary | ICD-10-CM

## 2023-03-29 DIAGNOSIS — E1159 Type 2 diabetes mellitus with other circulatory complications: Secondary | ICD-10-CM

## 2023-03-29 DIAGNOSIS — Z7984 Long term (current) use of oral hypoglycemic drugs: Secondary | ICD-10-CM | POA: Diagnosis not present

## 2023-03-29 MED ORDER — CLOTRIMAZOLE-BETAMETHASONE 1-0.05 % EX CREA: 1.0000 | TOPICAL_CREAM | Freq: Every day | CUTANEOUS | 0 refills | Status: DC

## 2023-03-29 NOTE — Progress Notes (Signed)
 BP 111/76 (BP Location: Left Arm, Patient Position: Sitting, Cuff Size: Large)   Pulse (!) 109   Temp 98.5 F (36.9 C) (Oral)   Ht 6' 2 (1.88 m)   Wt 247 lb 3.2 oz (112.1 kg)   SpO2 96%   BMI 31.74 kg/m    Subjective:    Patient ID: Darren Allen, male    DOB: 12-Mar-1966, 58 y.o.   MRN: [id number]  HPI: Darren Allen is a 57 y.o. male  Chief Complaint  Patient presents with   Blood Pressure Check   HYPERTENSION with Chronic Kidney Disease Hypertension status: controlled  Satisfied with current treatment? yes Duration of hypertension: years BP monitoring frequency:  daily BP range: 120-130/80 BP medication side effects:  no Medication compliance: excellent compliance Previous BP meds:HCTZ and lisinopril  Aspirin : no Recurrent headaches: no Visual changes: no Palpitations: no Dyspnea: no Chest pain: no Lower extremity edema: no Dizzy/lightheaded: no  Relevant past medical, surgical, family and social history reviewed and updated as indicated. Interim medical history since our last visit reviewed. Allergies and medications reviewed and updated.  Review of Systems  Eyes:  Negative for visual disturbance.  Respiratory:  Negative for shortness of breath.   Cardiovascular:  Negative for chest pain and leg swelling.  Neurological:  Negative for light-headedness and headaches.    Per HPI unless specifically indicated above     Objective:    BP 111/76 (BP Location: Left Arm, Patient Position: Sitting, Cuff Size: Large)   Pulse (!) 109   Temp 98.5 F (36.9 C) (Oral)   Ht 6' 2 (1.88 m)   Wt 247 lb 3.2 oz (112.1 kg)   SpO2 96%   BMI 31.74 kg/m   Wt Readings from Last 3 Encounters:  03/29/23 247 lb 3.2 oz (112.1 kg)  03/29/23 245 lb 6.4 oz (111.3 kg)  03/18/23 245 lb (111.1 kg)    Physical Exam Vitals and nursing note reviewed.  Constitutional:      General: He is not in acute distress.    Appearance: Normal appearance. He is not ill-appearing,  toxic-appearing or diaphoretic.  HENT:     Head: Normocephalic.     Right Ear: External ear normal.     Left Ear: External ear normal.     Nose: Nose normal. No congestion or rhinorrhea.     Mouth/Throat:     Mouth: Mucous membranes are moist.  Eyes:     General:        Right eye: No discharge.        Left eye: No discharge.     Extraocular Movements: Extraocular movements intact.     Conjunctiva/sclera: Conjunctivae normal.     Pupils: Pupils are equal, round, and reactive to light.  Cardiovascular:     Rate and Rhythm: Normal rate and regular rhythm.     Heart sounds: No murmur heard. Pulmonary:     Effort: Pulmonary effort is normal. No respiratory distress.     Breath sounds: Normal breath sounds. No wheezing, rhonchi or rales.  Abdominal:     General: Abdomen is flat. Bowel sounds are normal.  Musculoskeletal:     Cervical back: Normal range of motion and neck supple.  Skin:    General: Skin is warm and dry.     Capillary Refill: Capillary refill takes less than 2 seconds.  Neurological:     General: No focal deficit present.     Mental Status: He is alert and oriented to person, place, and  time.  Psychiatric:        Mood and Affect: Mood normal.        Behavior: Behavior normal.        Thought Content: Thought content normal.        Judgment: Judgment normal.     Results for orders placed or performed in visit on 02/25/23  Microscopic Examination   Collection Time: 02/25/23 10:25 AM   Urine  Result Value Ref Range   WBC, UA 0-5 0 - 5 /hpf   RBC, Urine 0-2 0 - 2 /hpf   Epithelial Cells (non renal) None seen 0 - 10 /hpf   Bacteria, UA None seen None seen/Few  Urinalysis, Routine w reflex microscopic   Collection Time: 02/25/23 10:25 AM  Result Value Ref Range   Specific Gravity, UA 1.025 1.005 - 1.030   pH, UA 7.0 5.0 - 7.5   Color, UA Yellow Yellow   Appearance Ur Clear Clear   Leukocytes,UA Negative Negative   Protein,UA 2+ (A) Negative/Trace   Glucose,  UA 1+ (A) Negative   Ketones, UA Trace (A) Negative   RBC, UA Negative Negative   Bilirubin, UA Negative Negative   Urobilinogen, Ur 0.2 0.2 - 1.0 mg/dL   Nitrite, UA Negative Negative   Microscopic Examination See below:   Microalbumin, Urine Waived   Collection Time: 02/25/23 10:25 AM  Result Value Ref Range   Microalb, Ur Waived 150 (H) 0 - 19 mg/L   Creatinine, Urine Waived 200 10 - 300 mg/dL   Microalb/Creat Ratio 30-300 (H) <30 mg/g  TSH   Collection Time: 02/25/23 10:26 AM  Result Value Ref Range   TSH 1.510 0.450 - 4.500 uIU/mL  Lipid panel   Collection Time: 02/25/23 10:26 AM  Result Value Ref Range   Cholesterol, Total 114 100 - 199 mg/dL   Triglycerides 583 (H) 0 - 149 mg/dL   HDL 22 (L) >60 mg/dL   VLDL Cholesterol Cal 60 (H) 5 - 40 mg/dL   LDL Chol Calc (NIH) 32 0 - 99 mg/dL   Chol/HDL Ratio 5.2 (H) 0.0 - 5.0 ratio  CBC with Differential/Platelet   Collection Time: 02/25/23 10:26 AM  Result Value Ref Range   WBC 7.0 3.4 - 10.8 x10E3/uL   RBC 5.32 4.14 - 5.80 x10E6/uL   Hemoglobin 11.6 (L) 13.0 - 17.7 g/dL   Hematocrit 60.3 62.4 - 51.0 %   MCV 74 (L) 79 - 97 fL   MCH 21.8 (L) 26.6 - 33.0 pg   MCHC 29.3 (L) 31.5 - 35.7 g/dL   RDW 82.4 (H) 88.3 - 84.5 %   Platelets 272 150 - 450 x10E3/uL   Neutrophils 64 Not Estab. %   Lymphs 19 Not Estab. %   Monocytes 12 Not Estab. %   Eos 4 Not Estab. %   Basos 1 Not Estab. %   Neutrophils Absolute 4.5 1.4 - 7.0 x10E3/uL   Lymphocytes Absolute 1.3 0.7 - 3.1 x10E3/uL   Monocytes Absolute 0.8 0.1 - 0.9 x10E3/uL   EOS (ABSOLUTE) 0.3 0.0 - 0.4 x10E3/uL   Basophils Absolute 0.0 0.0 - 0.2 x10E3/uL   Immature Granulocytes 0 Not Estab. %   Immature Grans (Abs) 0.0 0.0 - 0.1 x10E3/uL  Comprehensive metabolic panel   Collection Time: 02/25/23 10:26 AM  Result Value Ref Range   Glucose 234 (H) 70 - 99 mg/dL   BUN 13 6 - 24 mg/dL   Creatinine, Ser 8.84 0.76 - 1.27 mg/dL   eGFR 73 >  59 mL/min/1.73   BUN/Creatinine Ratio 11 9 -  20   Sodium 137 134 - 144 mmol/L   Potassium 3.5 3.5 - 5.2 mmol/L   Chloride 96 96 - 106 mmol/L   CO2 23 20 - 29 mmol/L   Calcium  9.8 8.7 - 10.2 mg/dL   Total Protein 6.9 6.0 - 8.5 g/dL   Albumin  4.2 3.8 - 4.9 g/dL   Globulin, Total 2.7 1.5 - 4.5 g/dL   Bilirubin Total 0.5 0.0 - 1.2 mg/dL   Alkaline Phosphatase 59 44 - 121 IU/L   AST 34 0 - 40 IU/L   ALT 39 0 - 44 IU/L  HgB A1c   Collection Time: 02/25/23 10:26 AM  Result Value Ref Range   Hgb A1c MFr Bld 8.3 (H) 4.8 - 5.6 %   Est. 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  Well controlled.  Continue Lisinopril  at 10mg  and hydrochlorothiazide .  Follow up in 2 months.  Call sooner if concerns arise.         Follow up plan: Return in about 2 months (around 05/27/2023) for HTN, HLD, DM2 FU.

## 2023-03-29 NOTE — Patient Instructions (Addendum)
You have requested to have a Ventral Hernia Repair. This will be done by Dr Everlene Farrier at [facility]. Please see your (BLUE) Pre-care sheet for more information. Our surgery scheduler will call you to look at surgery dates and to go over surgery information.   You will need to arrange to be out of work for approximately 1-2 weeks and then you may return with a lifting restriction for 4 more weeks. If you have FMLA or Disability paperwork that needs to be filled out, please have your company fax your paperwork to [phone] or you may drop this by either office. This paperwork will be filled out within 3 days after your surgery has been completed.     Ventral Hernia A ventral hernia (also called an incisional hernia) is a hernia that occurs at the site of a previous surgical cut (incision) in the abdomen. The abdominal wall spans from your lower chest down to your pelvis. If the abdominal wall is weakened from a surgical incision, a hernia can occur. A hernia is a bulge of bowel or muscle tissue pushing out on the weakened part of the abdominal wall. Ventral hernias can get bigger from straining or lifting. Obese and older people are at higher risk for a ventral hernia. People who develop infections after surgery or require repeat incisions at the same site on the abdomen are also at increased risk. CAUSES  A ventral hernia occurs because of weakness in the abdominal wall at an incision site.  SYMPTOMS  Common symptoms include: A visible bulge or lump on the abdominal wall. Pain or tenderness around the lump. Increased discomfort if you cough or make a sudden movement. If the hernia has blocked part of the intestine, a serious complication can occur (incarcerated or strangulated hernia). This can become a problem that requires emergency surgery because the blood flow to the blocked intestine may be cut off. Symptoms may include: Feeling sick to your stomach (nauseous). Throwing up (vomiting). Stomach  swelling (distention) or bloating. Fever. Rapid heartbeat. DIAGNOSIS  Your health care provider will take a medical history and perform a physical exam. Various tests may be ordered, such as: Blood tests. Urine tests. Ultrasonography. X-rays. Computed tomography (CT). TREATMENT  Watchful waiting may be all that is needed for a smaller hernia that does not cause symptoms. Your health care provider may recommend the use of a supportive belt (truss) that helps to keep the abdominal wall intact. For larger hernias or those that cause pain, surgery to repair the hernia is usually recommended. If a hernia becomes strangulated, emergency surgery needs to be done right away. HOME CARE INSTRUCTIONS Avoid putting pressure or strain on the abdominal area. Avoid heavy lifting. Use good body positioning for physical tasks. Ask your health care provider about proper body positioning. Use a supportive belt as directed by your health care provider. Maintain a healthy weight. Eat foods that are high in fiber, such as whole grains, fruits, and vegetables. Fiber helps prevent difficult bowel movements (constipation). Drink enough fluids to keep your urine clear or pale yellow. Follow up with your health care provider as directed. SEEK MEDICAL CARE IF:  Your hernia seems to be getting larger or more painful. SEEK IMMEDIATE MEDICAL CARE IF:  You have abdominal pain that is sudden and sharp. Your pain becomes severe. You have repeated vomiting. You are sweating a lot. You notice a rapid heartbeat. You develop a fever. MAKE SURE YOU:  Understand these instructions. Will watch your condition. Will get help  right away if you are not doing well or get worse.     Open Ventral Hernia Repair Open ventral hernia repair is a surgery to fix a ventral hernia. A ventral hernia,  is a bulge of body tissue or intestines that pushes through the front part of the abdomen. This can happen if the connective tissue  covering the muscles over the abdomen has a weak spot or is torn because of a surgical cut (incision) from a previous surgery. A ventral hernia repair is often done soon after diagnosis to stop the hernia from getting bigger, becoming uncomfortable, or becoming an emergency. This surgery usually takes about 2 hours, but the time can vary greatly.  LET [facility] CARE PROVIDER KNOW ABOUT: Any allergies you have. All medicines you are taking, including steroids, vitamins, herbs, eye drops, creams, and over-the-counter medicines. Previous problems you or members of your family have had with the use of anesthetics. Any blood disorders you have. Previous surgeries you have had. Medical conditions you have.  RISKS AND COMPLICATIONS  Generally, Open ventral hernia repair is a safe procedure. However, as with any surgical procedure, problems can occur. Possible problems include: Bleeding. Trouble passing urine or having a bowel movement after the surgery. Infection. Pneumonia. Blood clots. Pain in the area of the hernia. A bulge in the area of the hernia that may be caused by a collection of fluid. Injury to intestines or other structures in the abdomen. Return of the hernia after surgery.  BEFORE THE PROCEDURE  You may need to have blood tests, urine tests, a chest X-ray, or an electrocardiogram done before the day of the surgery. Ask your health care provider about changing or stopping your regular medicines. This is especially important if you are taking diabetes medicines or blood thinners. You may need to wash with a special type of germ-killing soap. Do not eat or drink anything after midnight the night before the procedure or as directed by your health care provider. Make plans to have someone drive you home after the procedure.  PROCEDURE  Small monitors will be put on your body. They are used to check your heart, blood pressure, and oxygen level. An IV access tube will be put into a  vein in your hand or arm. Fluids and medicine will flow directly into your body through the IV tube. You will be given medicine that makes you go to sleep (general anesthetic). Your abdomen will be cleaned with a special soap to kill any germs on your skin. Once you are asleep, a moderate - large size incision will be made in your abdomen. The size of incision depends on how large your hernia is. Your surgeon puts the tissue or intestines that formed the hernia back in place. A screen-like patch (mesh) is used to close the hernia. This helps make the area stronger. Stitches, tacks, or staples are used to keep the mesh in place. Medicine and a bandage (dressing) or skin glue will be put over the incision.  AFTER THE PROCEDURE  You will stay in a recovery area until the anesthetic wears off. Your blood pressure and pulse will be checked often. You may be able to go home the same day or may need to stay in the hospital for 1-2 days after surgery. Your surgeon will decide when you can go home depending upon your recovery. You may feel some pain. You will be given medicine for pain. You will be urged to do breathing exercises that involve  taking deep breaths. This helps prevent a lung infection after a surgery. You may have to wear compression stockings while you are in the hospital. These stockings help keep blood clots from forming in your legs.   This information is not intended to replace advice given to you by your health care provider. Make sure you discuss any questions you have with your health care provider.   Document Released: 02/17/2012 Document Revised: 03/07/2013 Document Reviewed: 02/17/2012 Elsevier Interactive Patient Education [employer]

## 2023-03-30 ENCOUNTER — Telehealth: Payer: Self-pay | Admitting: Surgery

## 2023-03-30 NOTE — Telephone Encounter (Signed)
 Patient has been advised of Pre-Admission date/time, and Surgery date at [facility].  Surgery Date: 04/08/23 Preadmission Testing Date: 04/05/23 (phone 8a-1p)  Patient has been made aware to call [phone], between 1-3:00pm the day before surgery, to find out what time to arrive for surgery.

## 2023-03-30 NOTE — Assessment & Plan Note (Signed)
 Chronic.  Well controlled.  Continue Lisinopril at 10mg  and hydrochlorothiazide.  Follow up in 2 months.  Call sooner if concerns arise.

## 2023-03-31 ENCOUNTER — Encounter: Payer: Self-pay | Admitting: Surgery

## 2023-03-31 NOTE — Progress Notes (Signed)
 Surgical Consultation  03/31/2023  Darren Allen is an 58 y.o. male.   Chief Complaint  Patient presents with   Follow-up    Lower abdominal pain     HPI: Darren Allen is a 58 year old male well-known to me with history of COPD and prior large paraesophageal hernia that needed to be repaired open.  This was done on November 2023.  Reports that over the last month or so has felt a bulge and seems to be worsening with Valsalva mild pain.  No fevers no chills.  The pain is intermittent dull and worsening with Valsalva.  He did have a CT from a small ventral hernia and a well repair paraesophageal hiatus. Hehas have a history of chronic pain and is being seen by the pain clinic. Recently quit smoking last couple months ago and is currently only vaping as needed. Glycemic control has improved significantly over the last month.  Seems to have crescendo symptoms related to his ventral hernia  Past Medical History:  Diagnosis Date   Allergy    Calculus of kidney 02/04/2015   COPD (chronic obstructive pulmonary disease) (HCC)    Diabetes mellitus without complication (HCC)    type 2   Hypertension    Neuropathy     Past Surgical History:  Procedure Laterality Date   APPENDECTOMY     COLONOSCOPY WITH PROPOFOL N/A 12/31/2021   Procedure: COLONOSCOPY WITH PROPOFOL;  Surgeon: Wyline Mood, MD;  Location[facility] ENDOSCOPY;  Service: Gastroenterology;  Laterality: N/A;   ESOPHAGOGASTRODUODENOSCOPY N/A 12/31/2021   Procedure: ESOPHAGOGASTRODUODENOSCOPY (EGD);  Surgeon: Wyline Mood, MD;  Location: [facility] ENDOSCOPY;  Service: Gastroenterology;  Laterality: N/A;   INCISION AND DRAINAGE PERIRECTAL ABSCESS N/A 02/04/2015   Procedure: IRRIGATION AND DEBRIDEMENT PERIRECTAL ABSCESS;  Surgeon: Ida Rogue, MD;  Location: [facility] ORS;  Service: General;  Laterality: N/A;   INSERTION OF MESH  01/20/2022   Procedure: INSERTION OF MESH;  Surgeon: Leafy Ro, MD;  Location: [facility] ORS;  Service: General;;    KIDNEY STONE SURGERY Right    lung mass removal N/A    ORIF ANKLE FRACTURE Left 03/23/2017   Procedure: OPEN REDUCTION INTERNAL FIXATION (ORIF) ANKLE FRACTURE;  Surgeon: Signa Kell, MD;  Location: [facility] ORS;  Service: Orthopedics;  Laterality: Left;   RECTAL EXAM UNDER ANESTHESIA  02/04/2015   Procedure: RECTAL EXAM UNDER ANESTHESIA;  Surgeon: Ida Rogue, MD;  Location: [facility] ORS;  Service: General;;   SYNDESMOSIS REPAIR Left 03/23/2017   Procedure: SYNDESMOSIS REPAIR;  Surgeon: Signa Kell, MD;  Location: [facility] ORS;  Service: Orthopedics;  Laterality: Left;   UMBILICAL HERNIA REPAIR  01/20/2022   Procedure: HERNIA REPAIR UMBILICAL ADULT;  Surgeon: Leafy Ro, MD;  Location: [facility] ORS;  Service: General;;   XI ROBOTIC ASSISTED PARAESOPHAGEAL HERNIA REPAIR N/A 01/20/2022   Procedure: XI ROBOTIC ASSISTED PARAESOPHAGEAL HERNIA REPAIR, CONVERTED TO OPEN, RNFA to assist;  Surgeon: Leafy Ro, MD;  Location: [facility] ORS;  Service: General;  Laterality: N/A;    Family History  Problem Relation Age of Onset   Cancer Mother 77       Lung   Cancer Father        Colon   Heart disease Father    Alcohol abuse Father    Cancer Brother 51       Esophageal   Diabetes Brother    Heart disease Brother     Social History:  reports that he has quit smoking. His smoking use included cigarettes. He has a 17 pack-year  smoking history. He has been exposed to tobacco smoke. He has never used smokeless tobacco. He reports that he does not currently use alcohol after a past usage of about 12.0 standard drinks of alcohol per week. He reports that he does not use drugs.  Allergies:  Allergies  Allergen Reactions   Glipizide Other (See Comments) and Palpitations    Shaky, feel bad Other reaction(s): Dizziness   Cephalexin Rash   Duloxetine Anxiety and Nausea Only   Dupilumab Rash    Medications reviewed.     ROS Full ROS performed and is otherwise negative other than what is stated  in the HPI    BP (!) 147/80   Pulse (!) 116   Temp 98.7 F (37.1 C) (Oral)   Ht 6\' 2"  (1.88 m)   Wt 245 lb 6.4 oz (111.3 kg)   SpO2 96%   BMI 31.51 kg/m   Physical Exam Vitals and nursing note reviewed. Exam conducted with a chaperone present.  Constitutional:      Appearance: Normal appearance. He is not diaphoretic.  Cardiovascular:     Rate and Rhythm: Normal rate and regular rhythm.     Heart sounds: No murmur heard. Pulmonary:     Effort: Pulmonary effort is normal.     Breath sounds: Wheezing present. No rhonchi.  Abdominal:     General: There is no distension.     Palpations: There is no mass.     Tenderness: There is no abdominal tenderness. There is no guarding or rebound.     Hernia: A hernia is present.     Comments: 3 cm epigastric hernia mildly tender.  Musculoskeletal:        General: No swelling or tenderness. Normal range of motion.     Cervical back: Normal range of motion and neck supple. No rigidity or tenderness.  Skin:    General: Skin is warm and dry.     Capillary Refill: Capillary refill takes less than 2 seconds.  Neurological:     General: No focal deficit present.     Mental Status: He is alert and oriented to person, place, and time.  Psychiatric:        Mood and Affect: Mood normal.        Behavior: Behavior normal.        Thought Content: Thought content normal.        Judgment: Judgment normal.    Assessment/Plan: 58 year old male with prior open Nissen fundoplication for hiatal hernia.  He now presents with a 3cm incisional hernia.  W As of my assessment clinically I do think that we we will be able to perform an open repair as an outpatient.  Discussed with him this in detail.,  Benefits and possible complications including but not limited to: Bleeding, infection, recurrence, mesh issues, bowel injuries.  He seems to understand.  Please note the spent 40 minutes in this encounter including personally reviewing imaging studies,  coordinating his care, placing orders and performing documentation    Darren Big, MD [facility] General Surgeon

## 2023-03-31 NOTE — H&P (View-Only) (Signed)
Surgical Consultation  03/31/2023  Darren Allen is an 58 y.o. male.   Chief Complaint  Patient presents with   Follow-up    Lower abdominal pain     HPI: Darren Allen is a 58 year old male well-known to me with history of COPD and prior large paraesophageal hernia that needed to be repaired open.  This was done on November 2023.  Reports that over the last month or so has felt a bulge and seems to be worsening with Valsalva mild pain.  No fevers no chills.  The pain is intermittent dull and worsening with Valsalva.  He did have a CT from a small ventral hernia and a well repair paraesophageal hiatus. Hehas have a history of chronic pain and is being seen by the pain clinic. Recently quit smoking last couple months ago and is currently only vaping as needed. Glycemic control has improved significantly over the last month.  Seems to have crescendo symptoms related to his ventral hernia  Past Medical History:  Diagnosis Date   Allergy    Calculus of kidney 02/04/2015   COPD (chronic obstructive pulmonary disease) (HCC)    Diabetes mellitus without complication (HCC)    type 2   Hypertension    Neuropathy     Past Surgical History:  Procedure Laterality Date   APPENDECTOMY     COLONOSCOPY WITH PROPOFOL N/A 12/31/2021   Procedure: COLONOSCOPY WITH PROPOFOL;  Surgeon: Darren Mood, MD;  Location: [facility] ENDOSCOPY;  Service: Gastroenterology;  Laterality: N/A;   ESOPHAGOGASTRODUODENOSCOPY N/A 12/31/2021   Procedure: ESOPHAGOGASTRODUODENOSCOPY (EGD);  Surgeon: Darren Mood, MD;  Location: [facility] ENDOSCOPY;  Service: Gastroenterology;  Laterality: N/A;   INCISION AND DRAINAGE PERIRECTAL ABSCESS N/A 02/04/2015   Procedure: IRRIGATION AND DEBRIDEMENT PERIRECTAL ABSCESS;  Surgeon: Darren Rogue, MD;  Location: [facility] ORS;  Service: General;  Laterality: N/A;   INSERTION OF MESH  01/20/2022   Procedure: INSERTION OF MESH;  Surgeon: Darren Ro, MD;  Location: [facility] ORS;  Service: General;;    KIDNEY STONE SURGERY Right    lung mass removal N/A    ORIF ANKLE FRACTURE Left 03/23/2017   Procedure: OPEN REDUCTION INTERNAL FIXATION (ORIF) ANKLE FRACTURE;  Surgeon: Darren Kell, MD;  Location: [facility] ORS;  Service: Orthopedics;  Laterality: Left;   RECTAL EXAM UNDER ANESTHESIA  02/04/2015   Procedure: RECTAL EXAM UNDER ANESTHESIA;  Surgeon: Darren Rogue, MD;  Location: [facility] ORS;  Service: General;;   SYNDESMOSIS REPAIR Left 03/23/2017   Procedure: SYNDESMOSIS REPAIR;  Surgeon: Darren Kell, MD;  Location: [facility] ORS;  Service: Orthopedics;  Laterality: Left;   UMBILICAL HERNIA REPAIR  01/20/2022   Procedure: HERNIA REPAIR UMBILICAL ADULT;  Surgeon: Darren Ro, MD;  Location: [facility] ORS;  Service: General;;   XI ROBOTIC ASSISTED PARAESOPHAGEAL HERNIA REPAIR N/A 01/20/2022   Procedure: XI ROBOTIC ASSISTED PARAESOPHAGEAL HERNIA REPAIR, CONVERTED TO OPEN, RNFA to assist;  Surgeon: Darren Ro, MD;  Location: [facility] ORS;  Service: General;  Laterality: N/A;    Family History  Problem Relation Age of Onset   Cancer Mother 77       Lung   Cancer Father        Colon   Heart disease Father    Alcohol abuse Father    Cancer Brother 51       Esophageal   Diabetes Brother    Heart disease Brother     Social History:  reports that he has quit smoking. His smoking use included cigarettes. He has a 17 pack-year  smoking history. He has been exposed to tobacco smoke. He has never used smokeless tobacco. He reports that he does not currently use alcohol after a past usage of about 12.0 standard drinks of alcohol per week. He reports that he does not use drugs.  Allergies:  Allergies  Allergen Reactions   Glipizide Other (See Comments) and Palpitations    Shaky, feel bad Other reaction(s): Dizziness   Cephalexin Rash   Duloxetine Anxiety and Nausea Only   Dupilumab Rash    Medications reviewed.     ROS Full ROS performed and is otherwise negative other than what is stated  in the HPI    BP (!) 147/80   Pulse (!) 116   Temp 98.7 F (37.1 C) (Oral)   Ht 6\' 2"  (1.88 m)   Wt 245 lb 6.4 oz (111.3 kg)   SpO2 96%   BMI 31.51 kg/m   Physical Exam Vitals and nursing note reviewed. Exam conducted with a chaperone present.  Constitutional:      Appearance: Normal appearance. He is not diaphoretic.  Cardiovascular:     Rate and Rhythm: Normal rate and regular rhythm.     Heart sounds: No murmur heard. Pulmonary:     Effort: Pulmonary effort is normal.     Breath sounds: Wheezing present. No rhonchi.  Abdominal:     General: There is no distension.     Palpations: There is no mass.     Tenderness: There is no abdominal tenderness. There is no guarding or rebound.     Hernia: A hernia is present.     Comments: 3 cm epigastric hernia mildly tender.  Musculoskeletal:        General: No swelling or tenderness. Normal range of motion.     Cervical back: Normal range of motion and neck supple. No rigidity or tenderness.  Skin:    General: Skin is warm and dry.     Capillary Refill: Capillary refill takes less than 2 seconds.  Neurological:     General: No focal deficit present.     Mental Status: He is alert and oriented to person, place, and time.  Psychiatric:        Allen and Affect: Allen normal.        Behavior: Behavior normal.        Thought Content: Thought content normal.        Judgment: Judgment normal.    Assessment/Plan: 58 year old male with prior open Nissen fundoplication for hiatal hernia.  He now presents with a 3cm incisional hernia.  W As of my assessment clinically I do think that we we will be able to perform an open repair as an outpatient.  Discussed with him this in detail.,  Benefits and possible complications including but not limited to: Bleeding, infection, recurrence, mesh issues, bowel injuries.  He seems to understand.  Please note the spent 40 minutes in this encounter including personally reviewing imaging studies,  coordinating his care, placing orders and performing documentation    Darren Big, MD [facility] General Surgeon

## 2023-04-05 ENCOUNTER — Encounter
Admission: RE | Admit: 2023-04-05 | Discharge: 2023-04-05 | Disposition: A | Discharge status: Home / Self Care | Payer: 59 | Source: Ambulatory Visit | Attending: Surgery | Admitting: Surgery

## 2023-04-05 ENCOUNTER — Other Ambulatory Visit: Payer: Self-pay

## 2023-04-05 VITALS — Ht 74.0 in | Wt 245.0 lb

## 2023-04-05 DIAGNOSIS — E1140 Type 2 diabetes mellitus with diabetic neuropathy, unspecified: Secondary | ICD-10-CM

## 2023-04-05 DIAGNOSIS — Z01812 Encounter for preprocedural laboratory examination: Secondary | ICD-10-CM

## 2023-04-05 HISTORY — DX: Gastro-esophageal reflux disease without esophagitis: K21.9

## 2023-04-05 HISTORY — DX: Atherosclerotic heart disease of native coronary artery without angina pectoris: I25.10

## 2023-04-05 HISTORY — DX: Testicular hypofunction: E29.1

## 2023-04-05 HISTORY — DX: Depression, unspecified: F32.A

## 2023-04-05 HISTORY — DX: Hyperlipidemia, unspecified: E78.5

## 2023-04-05 HISTORY — DX: Sleep apnea, unspecified: G47.30

## 2023-04-05 HISTORY — DX: Malignant (primary) neoplasm, unspecified: C80.1

## 2023-04-05 HISTORY — DX: Other intervertebral disc degeneration, lumbosacral region without mention of lumbar back pain or lower extremity pain: M51.379

## 2023-04-05 HISTORY — DX: Personal history of urinary calculi: Z87.442

## 2023-04-05 HISTORY — DX: Unspecified asthma, uncomplicated: J45.909

## 2023-04-05 NOTE — Patient Instructions (Addendum)
Your procedure is scheduled [id number] January 23  Report to the Registration Desk on the 1st floor of the [facility]. To find out your arrival time, please call [phone] between 1PM - 3PM on:  Wednesday January 22 If your arrival time is 6:00 am, do not arrive before that time as the [facility] entrance doors do not open until 6:00 am.  REMEMBER: Instructions that are not followed completely may result in serious medical risk, up to and including death; or upon the discretion of your surgeon and anesthesiologist your surgery may need to be rescheduled.  Do not eat food after midnight the night before surgery.  No gum chewing or hard candies.  You may however, drink WATER up to 2 hours before you are scheduled to arrive for your surgery. Do not drink anything within 2 hours of your scheduled arrival time.   One week prior to surgery: Starting Thursday January 16  Stop Anti-inflammatories (NSAIDS) such as Advil, Aleve, Ibuprofen, Motrin, Naproxen, Naprosyn and Aspirin based products such as Excedrin, Goody's Powder, BC Powder. Stop ANY OVER THE COUNTER supplements until after surgery.  ascorbic acid (VITAMIN C)  ferrous sulfate  Magnesium Oxide   You may however, continue to take Tylenol if needed for pain up until the day of surgery.  **Follow guidelines for insulin and diabetes medications.** Patient instructed to consult Dr. Tedd Sias regarding insulin dosages prior to surgery.  metFORMIN (GLUCOPHAGE-XR) hold 2 days prior to surgery, last dose Monday January 20  Semaglutide hold 7 days prior to surgery, last dose Wednesday January 15  dapagliflozin propanediol [facility]) hold 3 days prior to surgery, last dose Sunday January 19   Continue taking all of your other prescription medications up until the day of surgery. tadalafil (CIALIS) hold 2 days prior to surgery Monday January 20   ON THE DAY OF SURGERY ONLY TAKE THESE MEDICATIONS WITH SIPS OF WATER:  cyanocobalamin  (VITAMIN B12)  fluticasone (FLONASE)  HYDROcodone-acetaminophen (NORCO/VICODIN) if needed for pain pantoprazole (PROTONIX)    Use inhalers on the day of surgery and bring to the hospital. Budeson-Glycopyrrol-Formoterol (BREZTRI AEROSPHERE)  PROAIR HFA   No Alcohol for 24 hours before or after surgery.  No Smoking including e-cigarettes for 24 hours before surgery.  No chewable tobacco products for at least 6 hours before surgery.  No nicotine patches on the day of surgery.  Do not use any "recreational" drugs for at least a week (preferably 2 weeks) before your surgery.  Please be advised that the combination of cocaine and anesthesia may have negative outcomes, up to and including death. If you test positive for cocaine, your surgery will be cancelled.  On the morning of surgery brush your teeth with toothpaste and water, you may rinse your mouth with mouthwash if you wish. Do not swallow any toothpaste or mouthwash.  Use CHG Soap as directed on instruction sheet.  Do not wear jewelry, make-up, hairpins, clips or nail polish.  For welded (permanent) jewelry: bracelets, anklets, waist bands, etc.  Please have this removed prior to surgery.  If it is not removed, there is a chance that hospital personnel will need to cut it off on the day of surgery.  Do not wear lotions, powders, or perfumes.   Do not shave body hair from the neck down 48 hours before surgery.  Contact lenses, hearing aids and dentures may not be worn into surgery.  Do not bring valuables to the hospital. [facility] is not responsible for any missing/lost belongings  or valuables.   Notify your doctor if there is any change in your medical condition (cold, fever, infection).  Wear comfortable clothing (specific to your surgery type) to the hospital.  After surgery, you can help prevent lung complications by doing breathing exercises.  Take deep breaths and cough every 1-2 hours.  When coughing or sneezing,  hold a pillow firmly against your incision with both hands. This is called "splinting." Doing this helps protect your incision. It also decreases belly discomfort.  If you are being discharged the day of surgery, you will not be allowed to drive home. You will need a responsible individual to drive you home and stay with you for 24 hours after surgery.   If you are taking public transportation, you will need to have a responsible individual with you.  Please call the Pre-admissions Testing Dept. at [phone] if you have any questions about these instructions.  Surgery Visitation Policy:  Patients having surgery or a procedure may have two visitors.  Children under the age of 89 must have an adult with them who is not the patient.  Temporary Visitor Restrictions Due to increasing cases of flu, RSV and COVID-19: Children ages 11 and under will not be able to visit patients in [facility] hospitals under most circumstances.      Preparing for Surgery with CHLORHEXIDINE GLUCONATE (CHG) Soap  Chlorhexidine Gluconate (CHG) Soap  o An antiseptic cleaner that kills germs and bonds with the skin to continue killing germs even after washing  o Used for showering the night before surgery and morning of surgery  Before surgery, you can play an important role by reducing the number of germs on your skin.  CHG (Chlorhexidine gluconate) soap is an antiseptic cleanser which kills germs and bonds with the skin to continue killing germs even after washing.  Please do not use if you have an allergy to CHG or antibacterial soaps. If your skin becomes reddened/irritated stop using the CHG.  1. Shower the NIGHT BEFORE SURGERY and the MORNING OF SURGERY with CHG soap.  2. If you choose to wash your hair, wash your hair first as usual with your normal shampoo.  3. After shampooing, rinse your hair and body thoroughly to remove the shampoo.  4. Use CHG as you would any other liquid soap. You can  apply CHG directly to the skin and wash gently with a scrungie or a clean washcloth.  5. Apply the CHG soap to your body only from the neck down. Do not use on open wounds or open sores. Avoid contact with your eyes, ears, mouth, and genitals (private parts). Wash face and genitals (private parts) with your normal soap.  6. Wash thoroughly, paying special attention to the area where your surgery will be performed.  7. Thoroughly rinse your body with warm water.  8. Do not shower/wash with your normal soap after using and rinsing off the CHG soap.  9. Pat yourself dry with a clean towel.  10. Wear clean pajamas to bed the night before surgery.  11. Place clean sheets on your bed the night of your first shower and do not sleep with pets.  12. Shower again with the CHG soap on the day of surgery prior to arriving at the hospital.  13. Do not apply any deodorants/lotions/powders.  14. Please wear clean clothes to the hospital.

## 2023-04-07 ENCOUNTER — Encounter: Payer: Self-pay | Admitting: Surgery

## 2023-04-07 NOTE — Progress Notes (Signed)
Perioperative / Anesthesia Services  Pre-Admission Testing Clinical Review / Pre-Operative Anesthesia Consult  Date: 04/07/23  Patient Demographics:  Name: Darren Allen DOB: [birth year] MRN:   [id number]  Planned Surgical Procedure(s):    Case: [id number] Date/Time: 04/08/23 0915   Procedure: HERNIA REPAIR VENTRAL ADULT, open, RNFA to assist   Anesthesia type: General   Pre-op diagnosis: ventral hernia 3 cm   Location: [facility] OR ROOM 07 / [facility]   Surgeons: Leafy Ro, MD      NOTE: Available PAT nursing documentation and vital signs have been reviewed. Clinical nursing staff has updated patient's PMH/PSHx, current medication list, and drug allergies/intolerances to ensure comprehensive history available to assist in medical decision making as it pertains to the aforementioned surgical procedure and anticipated anesthetic course. Extensive review of available clinical information personally performed. [place] PMH and PSHx updated with any diagnoses/procedures that  may have been inadvertently omitted during his intake with the pre-admission testing department's nursing staff.  Clinical Discussion:  Darren Allen is a 58 y.o. male who is submitted for pre-surgical anesthesia review and clearance prior to him undergoing the above procedure. Patient has never been a smoker in the past. Patient is a Former Smoker (17 pack years). Pertinent PMH includes: CAD, diastolic dysfunction, PVD, HTN, HLD, T2DM, asthma-COPD overlap syndrome, GERD (on daily PPI), OSAH (requires nocturnal PAP therapy), IDA, hypogonadism (on exogenous TRT), ED (on PDE5i), ventral hernia, lumbosacral DDD, diabetic peripheral neuropathy, chronic lower back pain (on chronic opioid therapy), depression.  Patient is followed by cardiology Darrold Junker, MD). He was last seen in the cardiology clinic on 11/17/2022; notes reviewed. At the time of his clinic visit, patient doing well overall from a  cardiovascular perspective.  Patient with some mild chronic shortness of breath that is reported to be stable and at baseline.  Patient denied any chest pain, shortness of breath, PND, orthopnea, palpitations, significant peripheral edema, weakness, fatigue, vertiginous symptoms, or presyncope/syncope. Patient with a past medical history significant for cardiovascular diagnoses. Documented physical exam was grossly benign, providing no evidence of acute exacerbation and/or decompensation of the patient's known cardiovascular conditions.  TTE performed on 12/27/2020 revealed a normal left ventricular systolic function with an EF of >55%. There were no regional wall motion abnormalities. Left ventricular diastolic Doppler parameters consistent with abnormal relaxation (G1DD). Right ventricular size and function normal.  There was trivial mitral, tricuspid, and pulmonary valve regurgitation. All transvalvular gradients were noted to be normal providing no evidence suggestive of valvular stenosis. Aorta normal in size with no evidence of ectasia or aneurysmal dilatation.  Myocardial perfusion imaging study was performed on 02/19/2021 revealing a normal left ventricular systolic function with an EF of 60%. There were no regional wall motion abnormalities.  No artifact or left ventricular cavity size enlargement appreciated on review of imaging. SPECT images demonstrated no evidence of stress-induced myocardial ischemia or arrhythmia; no scintigraphic evidence of scar. TID ratio 1.13. Study determined to be normal and low risk.  Coronary CTA was performed on 06/30/2021 that demonstrated an Agatston coronary artery calcium score of 216. This placed patient in the 84th percentile for age, sex, and race matched controls. Calcium depositions noted to be isolated mainly in the proximal RCA and proximal LAD (25-49%) distributions.  Study demonstrates normal coronary origin with RIGHT dominance.  Blood pressure  reasonably controlled at 140/80 mmHg on currently prescribed diuretic (HCTZ) monotherapy. Patient is on rosuvastatin for his HLD diagnosis and ASCVD prevention. T2DM loosely controlled on  currently prescribed regimen; last HgbA1c was 8.3% when checked on 02/25/2023. In the setting of known cardiovascular diagnoses and concurrent T2DM, patient is taking an SGLT2i (dapagliflozin) for added cardiovascular and renovascular protection. Additionally, it is important note that patient is on a PDE5i medication (tadalafil) and exogenous testosterone for  erectile dysfunction related to hypogonadism. Patient does not have an OSAH diagnosis. Patient is able to complete all of his  ADL/IADLs without cardiovascular limitation.  Per the DASI, patient is able to achieve at least 4 METS of physical activity without experiencing any significant degree of angina/anginal equivalent symptoms. No changes were made to his medication regimen during his visit with cardiology.  Patient scheduled to follow-up with outpatient cardiology in 6 months or sooner if needed.  Darren Allen is scheduled for an elective OPEN VENTRAL HERNIA REPAIR on 04/07/2022 with Dr. Sterling Big, MD. Given patient's past medical history significant for cardiovascular and cardiopulmonary diagnoses, presurgical clearances were sought by the PAT team.   Per pulmonary medicine Karna Christmas, MD), "patient is stable and felt to be medically optimized from a pulmonary perspective.  He may proceed with the planned procedure at an overall LOW risk of experiencing significant cardiopulmonary complications perioperatively".  Per cardiology Darrold Junker, MD), "this patient is optimized for surgery and may proceed with the planned procedural course with a LOW risk of significant perioperative cardiovascular complications".  In review of his medication reconciliation, the patient is not noted to be taking any type of anticoagulation or antiplatelet therapies that would  need to be held during his perioperative course.  Patient denies previous perioperative complications with anesthesia in the past. In review his EMR, it is noted that patient underwent a general anesthetic course here at [facility] ([facility] III) in 01/2022 without documented complications.      04/05/2023   12:05 PM 03/29/2023    3:15 PM 03/29/2023    2:11 PM  Vitals with BMI  Height 6\' 2"  6\' 2"  6\' 2"   Weight 245 lbs 247 lbs 3 oz 245 lbs 6 oz  BMI 31.44 31.73 31.49  Systolic  111 147  Diastolic  76 80  Pulse  109 116   Providers/Specialists:  NOTE: Primary physician provider listed below. Patient may have been seen by APP or partner within same practice.   PROVIDER ROLE / SPECIALTY LAST Cherly Hensen, MD General Surgery (Surgeon) 03/29/2023  Larae Grooms, NP Primary Care Provider 03/29/2023  Marcina Millard, MD Cardiology 11/17/2022  Delano Metz, MD Pain Management 03/18/2023  Cristopher Peru, MD Neurology 02/15/2023  Wendall Mola, MD Endocrinology 12/10/2022  Vida Rigger, MD Pulmonary Medicine 11/24/2022   Allergies:   Allergies  Allergen Reactions   Glipizide Other (See Comments) and Palpitations    Shaky, feel bad Other reaction(s): Dizziness   Cephalexin Rash   Duloxetine Anxiety and Nausea Only   Dupixent [Dupilumab] Rash   Current Home Medications:   No current facility-administered medications for this encounter.    amitriptyline (ELAVIL) 10 MG tablet   ascorbic acid (VITAMIN C) 500 MG tablet   Budeson-Glycopyrrol-Formoterol (BREZTRI AEROSPHERE) 160-9-4.8 MCG/ACT AERO   cyanocobalamin (VITAMIN B12) 1000 MCG tablet   dapagliflozin propanediol (FARXIGA) 10 MG TABS tablet   ferrous sulfate 325 (65 FE) MG tablet   fluticasone (FLONASE) 50 MCG/ACT nasal spray   gabapentin (NEURONTIN) 600 MG tablet   hydrochlorothiazide (HYDRODIURIL) 25 MG tablet   [START ON 04/22/2023] HYDROcodone-acetaminophen (NORCO/VICODIN)  5-325 MG tablet   insulin isophane & regular human (  HUMULIN 70/30 KWIKPEN) (70-30) 100 UNIT/ML KwikPen   ipratropium-albuterol (DUONEB) 0.5-2.5 (3) MG/3ML SOLN   lisinopril (ZESTRIL) 10 MG tablet   Magnesium Oxide -Mg Supplement 500 MG TABS   metFORMIN (GLUCOPHAGE-XR) 500 MG 24 hr tablet   montelukast (SINGULAIR) 10 MG tablet   naloxone (NARCAN) nasal spray 4 mg/0.1 mL   pantoprazole (PROTONIX) 40 MG tablet   PRESCRIPTION MEDICATION   PROAIR HFA 108 (90 Base) MCG/ACT inhaler   rosuvastatin (CRESTOR) 40 MG tablet   Semaglutide, 2 MG/DOSE, 8 MG/3ML SOPN   tazarotene (AVAGE) 0.1 % cream   testosterone cypionate (DEPOTESTOSTERONE CYPIONATE) 200 MG/ML injection   clotrimazole-betamethasone (LOTRISONE) cream   HYDROcodone-acetaminophen (NORCO/VICODIN) 5-325 MG tablet   NEEDLE, DISP, 18 G (BD SAFETYGLIDE NEEDLE) 18G X 1-1/2" MISC   NEEDLE, DISP, 21 G (BD SAFETYGLIDE NEEDLE) 21G X 1" MISC   tadalafil (CIALIS) 5 MG tablet   History:   Past Medical History:  Diagnosis Date   Allergy    Asthma    Chronic lower back pain    a.) followed by pain management; on COT   COPD (chronic obstructive pulmonary disease) (HCC)    Coronary artery disease    a.) cCTA 06/30/2021: Ca2+ = 216 (84th %ile; 25049% pRCA and pLAD))   DDD (degenerative disc disease), lumbosacral    Depression    Diabetic peripheral neuropathy (HCC)    Diastolic dysfunction 12/27/2020   a.) TTE 12/27/2020: EF >55%, no RWMAs, G1DD, norm RVSF, triv MR/TR?PR   Diverticulosis    Erectile dysfunction    a.) on PDE5i (tadalafil)   GERD (gastroesophageal reflux disease)    Hepatic steatosis    History of kidney stones    Hyperlipidemia    Hypertension    Hypogonadism male    a.) on exogenous TRT (depotestosterone cypionate)   IDA (iron deficiency anemia)    Long term current use of opiate analgesic    a.) followed by pain management; naloxone Rx available   OSA on CPAP    Paraesophageal hernia    a.) s/p robotic assisted  repair 01/2022   PVD (peripheral vascular disease) (HCC)    T2DM (type 2 diabetes mellitus) (HCC)    Umbilical hernia    a.) s/p repair 01/20/2022   Ventral hernia    Past Surgical History:  Procedure Laterality Date   APPENDECTOMY     BRONCHOSCOPY  2016   CIRCUMCISION     COLONOSCOPY WITH PROPOFOL N/A 12/31/2021   Procedure: COLONOSCOPY WITH PROPOFOL;  Surgeon: Wyline Mood, MD;  Location: [facility] ENDOSCOPY;  Service: Gastroenterology;  Laterality: N/A;   ESOPHAGOGASTRODUODENOSCOPY N/A 12/31/2021   Procedure: ESOPHAGOGASTRODUODENOSCOPY (EGD);  Surgeon: Wyline Mood, MD;  Location: [facility] ENDOSCOPY;  Service: Gastroenterology;  Laterality: N/A;   INCISION AND DRAINAGE PERIRECTAL ABSCESS N/A 02/04/2015   Procedure: IRRIGATION AND DEBRIDEMENT PERIRECTAL ABSCESS;  Surgeon: Ida Rogue, MD;  Location: [facility] ORS;  Service: General;  Laterality: N/A;   INSERTION OF MESH  01/20/2022   Procedure: INSERTION OF MESH;  Surgeon: Leafy Ro, MD;  Location: [facility] ORS;  Service: General;;   KIDNEY STONE SURGERY Right    lung mass removal N/A    MUSCLE BIOPSY     ORIF ANKLE FRACTURE Left 03/23/2017   Procedure: OPEN REDUCTION INTERNAL FIXATION (ORIF) ANKLE FRACTURE;  Surgeon: Signa Kell, MD;  Location: [facility] ORS;  Service: Orthopedics;  Laterality: Left;   RECTAL EXAM UNDER ANESTHESIA  02/04/2015   Procedure: RECTAL EXAM UNDER ANESTHESIA;  Surgeon: Ida Rogue, MD;  Location: [facility] ORS;  Service: General;;   SYNDESMOSIS REPAIR Left 03/23/2017   Procedure: SYNDESMOSIS REPAIR;  Surgeon: Signa Kell, MD;  Location: [facility] ORS;  Service: Orthopedics;  Laterality: Left;   UMBILICAL HERNIA REPAIR  01/20/2022   Procedure: HERNIA REPAIR UMBILICAL ADULT;  Surgeon: Leafy Ro, MD;  Location: [facility] ORS;  Service: General;;   VASECTOMY     XI ROBOTIC ASSISTED PARAESOPHAGEAL HERNIA REPAIR N/A 01/20/2022   Procedure: XI ROBOTIC ASSISTED PARAESOPHAGEAL HERNIA REPAIR, CONVERTED TO OPEN, RNFA to  assist;  Surgeon: Leafy Ro, MD;  Location: [facility] ORS;  Service: General;  Laterality: N/A;   Family History  Problem Relation Age of Onset   Cancer Mother 2       Lung   Cancer Father        Colon   Heart disease Father    Alcohol abuse Father    Cancer Brother 2       Esophageal   Diabetes Brother    Heart disease Brother    Social History   Tobacco Use   Smoking status: Former    Current packs/day: 0.50    Average packs/day: 0.5 packs/day for 34.0 years (17.0 ttl pk-yrs)    Types: Cigarettes    Passive exposure: Past   Smokeless tobacco: Never   Tobacco comments:    patient using Nicoderm patches  Substance Use Topics   Alcohol use: Not Currently    Alcohol/week: 12.0 standard drinks of alcohol    Types: 12 Cans of beer per week    Comment: Quit February 2023   Pertinent Clinical Results:  LABS:  Lab Results  Component Value Date   WBC 7.0 02/25/2023   HGB 11.6 (L) 02/25/2023   HCT 39.6 02/25/2023   MCV 74 (L) 02/25/2023   PLT 272 02/25/2023   Lab Results  Component Value Date   NA 137 02/25/2023   K 3.5 02/25/2023   CO2 23 02/25/2023   GLUCOSE 234 (H) 02/25/2023   BUN 13 02/25/2023   CREATININE 1.15 02/25/2023   CALCIUM 9.8 02/25/2023   EGFR 73 02/25/2023   GFRNONAA >60 02/15/2022    ECG: Date: 02/15/2022  Time ECG obtained: 1911 PM Rate: 103 bpm Rhythm: sinus tachycardia Axis (leads I and aVF): normal Intervals: PR 164 ms. QRS 96 ms. QTc 458 ms. ST segment and T wave changes: No evidence of acute T wave abnormalities or significant ST segment elevation or depression.  Comparison: Similar to previous tracing obtained on 02/03/2022   IMAGING / PROCEDURES: CT ABDOMEN PELVIS W CONTRAST performed on 03/04/2023 Small ventral hernia contains fat with a 13 mm aperture width. Postsurgical change about the gastroesophageal junction with largely resolved fluid collection now measuring 12 x 10 mm previously 16.3 x 3.1 cm. Diffuse hepatic  steatosis. Colonic diverticulosis without findings of acute diverticulitis. Aortic atherosclerosis   PULMONARY FUNCTION TESTING performed on 06/24/2022 SPIROMETRY: FVC was 4.35 liters, 81% of predicted FEV1 was 3.08, 75% of predicted FEV1 ratio was 70.31 FEF 25-75% liters per second was 60% of predicted LUNG VOLUMES: TLC was 87% of predicted RV was 113% of predicted DIFFUSION CAPACITY: DLCO was 79% of predicted DLCO/VA was 110% of predicted FLOW VOLUME LOOP: Scooping of expiratory limb of flow volume loop suggestive of obstructive physiology. IMPRESSION: Borderline normal fev/fvc ratio with mild obstructive ventilatory defect , preserved lung volumes and borderline reduced DLCO at 79%   MR LUMBAR SPINE WO CONTRAST performed on 06/18/2022 L5-S1: Disc degeneration with a shallow disc protrusion more prominent towards the left.  This is adjacent to the left S1 nerve as it buds from the thecal sac, but definite neural compression is not established. Finding may be very minimally worse when compared to 2021. L2-3: Disc bulge without compressive narrowing of the canal or foramina. Slight prominence in the left extraforaminal region but without visible affect upon the adjacent L2 nerve.  US AORTA DUPLEX LIMITED performed on 02/26/2022 Proximal:  2.9 x 2.8 cm Mid:  2.1 x 2.1 cm Distal:  1.6 x 1.7 cm Patent: Yes, peak systolic velocity is 107 cm/s Right common iliac artery: 1 x 1.2 cm Left common iliac artery: 1 x 1.2 cm No abdominal aortic aneurysm.  CT CORONARY MORPH W/CTA COR W/SCORE W/CA W/CM &/OR WO/CM performed on 06/30/2021 Stable mild chronic lung disease and moderate-size hiatal hernia. Coronary calcium score of 216. This was 84th percentile for age and sex matched control. Normal coronary origin with right dominance. Calcified plaque causing mild proximal RCA and proximal LAD stenosis (25-49%). CAD-RADS 2. Mild non-obstructive CAD (25-49%). Consider non-atherosclerotic causes  of chest pain. Consider preventive therapy and risk factor modification. Images quality degraded by motion artifacts.   MYOCARDIAL PERFUSION IMAGING STUDY (LEXISCAN) performed on 02/19/2021 Normal left ventricular systolic function with a normal LVEF of 60% Normal myocardial thickening and wall motion Left ventricular cavity size normal SPECT images demonstrate homogenous tracer distribution throughout the myocardium No evidence of stress-induced myocardial ischemia or arrhythmia Normal low risk study  TRANSTHORACIC ECHOCARDIOGRAM performed on 12/27/2020 Normal left ventricular systolic function with an EF of >55% Left ventricular diastolic Doppler parameters consistent with abnormal relaxation (G1DD). Normal right ventricular systolic function Trivial MR, TR, PR Normal transvalvular gradients; no valvular stenosis No pericardial effusion  Impression and Plan:  Darren Allen has been referred for pre-anesthesia review and clearance prior to him undergoing the planned anesthetic and procedural courses. Available labs, pertinent testing, and imaging results were personally reviewed by me in preparation for upcoming operative/procedural course. [facility] medical record has been updated following extensive record review and patient interview with PAT staff.   This patient has been appropriately cleared by cardiology (LOW) and by pulmonary medicine (LOW) with the individually indicated risk of patient experiencing significant perioperative cardiovascular/cardiopulmonary complications. Based on clinical review performed today (04/07/23), barring any significant acute changes in the patient's overall condition, it is anticipated that he will be able to proceed with the planned surgical intervention. Any acute changes in clinical condition may necessitate his procedure being postponed and/or cancelled. Patient will meet with anesthesia team (MD and/or CRNA) on the day of his procedure for  preoperative evaluation/assessment. Questions regarding anesthetic course will be fielded at that time.   Pre-surgical instructions were reviewed with the patient during his PAT appointment, and questions were fielded to satisfaction by PAT clinical staff. He has been instructed on which medications that he will need to hold prior to surgery, as well as the ones that have been deemed safe/appropriate to take on the day of his procedure. As part of the general education provided by PAT, patient made aware both verbally and in writing, that he would need to abstain from the use of any illegal substances during his perioperative course. He was advised that failure to follow the provided instructions could necessitate case cancellation or result in serious perioperative complications up to and including death. Patient encouraged to contact PAT and/or his surgeon's office to discuss any questions or concerns that may arise prior to surgery; verbalized understanding.   Quentin Mulling, MSN, APRN,  FNP-C, CEN [facility]  Perioperative Services Nurse Practitioner Phone: [phone] Fax: [phone] 04/07/23 4:32 PM  NOTE: This note has been prepared using Dragon dictation software. Despite my best ability to proofread, there is always the potential that unintentional transcriptional errors may still occur from this process.

## 2023-04-08 ENCOUNTER — Ambulatory Visit
Admission: RE | Admit: 2023-04-08 | Discharge: 2023-04-08 | Disposition: A | Discharge status: Home / Self Care | Payer: 59 | Source: Ambulatory Visit | Attending: Surgery | Admitting: Surgery

## 2023-04-08 ENCOUNTER — Other Ambulatory Visit: Payer: Self-pay

## 2023-04-08 ENCOUNTER — Ambulatory Visit: Payer: 59 | Admitting: Urgent Care

## 2023-04-08 ENCOUNTER — Encounter: Payer: Self-pay | Admitting: Surgery

## 2023-04-08 ENCOUNTER — Encounter
Admission: RE | Disposition: A | Discharge status: Home / Self Care | Payer: Self-pay | Source: Ambulatory Visit | Attending: Surgery

## 2023-04-08 DIAGNOSIS — Z01812 Encounter for preprocedural laboratory examination: Secondary | ICD-10-CM

## 2023-04-08 DIAGNOSIS — J449 Chronic obstructive pulmonary disease, unspecified: Secondary | ICD-10-CM | POA: Diagnosis not present

## 2023-04-08 DIAGNOSIS — I2510 Atherosclerotic heart disease of native coronary artery without angina pectoris: Secondary | ICD-10-CM | POA: Diagnosis not present

## 2023-04-08 DIAGNOSIS — K760 Fatty (change of) liver, not elsewhere classified: Secondary | ICD-10-CM | POA: Insufficient documentation

## 2023-04-08 DIAGNOSIS — K219 Gastro-esophageal reflux disease without esophagitis: Secondary | ICD-10-CM | POA: Insufficient documentation

## 2023-04-08 DIAGNOSIS — G8929 Other chronic pain: Secondary | ICD-10-CM | POA: Diagnosis not present

## 2023-04-08 DIAGNOSIS — G4733 Obstructive sleep apnea (adult) (pediatric): Secondary | ICD-10-CM | POA: Insufficient documentation

## 2023-04-08 DIAGNOSIS — Z79891 Long term (current) use of opiate analgesic: Secondary | ICD-10-CM | POA: Insufficient documentation

## 2023-04-08 DIAGNOSIS — E1140 Type 2 diabetes mellitus with diabetic neuropathy, unspecified: Secondary | ICD-10-CM | POA: Diagnosis not present

## 2023-04-08 DIAGNOSIS — K436 Other and unspecified ventral hernia with obstruction, without gangrene: Secondary | ICD-10-CM | POA: Diagnosis present

## 2023-04-08 DIAGNOSIS — Z87891 Personal history of nicotine dependence: Secondary | ICD-10-CM | POA: Diagnosis not present

## 2023-04-08 DIAGNOSIS — E1151 Type 2 diabetes mellitus with diabetic peripheral angiopathy without gangrene: Secondary | ICD-10-CM | POA: Diagnosis not present

## 2023-04-08 DIAGNOSIS — I10 Essential (primary) hypertension: Secondary | ICD-10-CM | POA: Insufficient documentation

## 2023-04-08 DIAGNOSIS — K430 Incisional hernia with obstruction, without gangrene: Secondary | ICD-10-CM | POA: Diagnosis not present

## 2023-04-08 DIAGNOSIS — K432 Incisional hernia without obstruction or gangrene: Secondary | ICD-10-CM

## 2023-04-08 HISTORY — DX: Type 2 diabetes mellitus without complications: E11.9

## 2023-04-08 HISTORY — DX: Ventral hernia without obstruction or gangrene: K43.9

## 2023-04-08 HISTORY — PX: VENTRAL HERNIA REPAIR: SHX424

## 2023-04-08 HISTORY — DX: Fatty (change of) liver, not elsewhere classified: K76.0

## 2023-04-08 HISTORY — DX: Low back pain, unspecified: M54.50

## 2023-04-08 HISTORY — DX: Type 2 diabetes mellitus with diabetic polyneuropathy: E11.42

## 2023-04-08 HISTORY — PX: INSERTION OF MESH: SHX5868

## 2023-04-08 HISTORY — DX: Peripheral vascular disease, unspecified: I73.9

## 2023-04-08 HISTORY — DX: Male erectile dysfunction, unspecified: N52.9

## 2023-04-08 HISTORY — DX: Diverticulosis of intestine, part unspecified, without perforation or abscess without bleeding: K57.90

## 2023-04-08 HISTORY — DX: Long term (current) use of opiate analgesic: Z79.891

## 2023-04-08 HISTORY — DX: Umbilical hernia without obstruction or gangrene: K42.9

## 2023-04-08 HISTORY — DX: Obstructive sleep apnea (adult) (pediatric): G47.33

## 2023-04-08 HISTORY — DX: Other chronic pain: G89.29

## 2023-04-08 HISTORY — DX: Diaphragmatic hernia without obstruction or gangrene: K44.9

## 2023-04-08 HISTORY — DX: Iron deficiency anemia, unspecified: D50.9

## 2023-04-08 LAB — GLUCOSE, CAPILLARY: Glucose-Capillary: 227 mg/dL — ABNORMAL HIGH (ref 70–99)

## 2023-04-08 SURGERY — REPAIR, HERNIA, VENTRAL
Anesthesia: General | Site: Abdomen

## 2023-04-08 MED ORDER — PROPOFOL 10 MG/ML IV BOLUS
INTRAVENOUS | Status: DC | PRN
  Administered 2023-04-08: 20 mg via INTRAVENOUS
  Administered 2023-04-08: 200 mg via INTRAVENOUS
  Administered 2023-04-08: 20 mg via INTRAVENOUS

## 2023-04-08 MED ORDER — FENTANYL CITRATE (PF) 100 MCG/2ML IJ SOLN
25.0000 ug | INTRAMUSCULAR | Status: DC | PRN
  Administered 2023-04-08 (×2): 50 ug via INTRAVENOUS

## 2023-04-08 MED ORDER — BUPIVACAINE-EPINEPHRINE (PF) 0.25% -1:200000 IJ SOLN
INTRAMUSCULAR | Status: DC | PRN
  Administered 2023-04-08: 30 mL

## 2023-04-08 MED ORDER — OXYCODONE HCL 5 MG PO TABS
ORAL_TABLET | ORAL | Status: AC
  Filled 2023-04-08: qty 1

## 2023-04-08 MED ORDER — KETAMINE HCL 50 MG/5ML IJ SOSY
PREFILLED_SYRINGE | INTRAMUSCULAR | Status: AC
  Filled 2023-04-08: qty 5

## 2023-04-08 MED ORDER — LIDOCAINE HCL (PF) 2 % IJ SOLN
INTRAMUSCULAR | Status: AC
  Filled 2023-04-08: qty 5

## 2023-04-08 MED ORDER — ONDANSETRON HCL 4 MG/2ML IJ SOLN
INTRAMUSCULAR | Status: DC | PRN
  Administered 2023-04-08: 4 mg via INTRAVENOUS

## 2023-04-08 MED ORDER — ROCURONIUM BROMIDE 10 MG/ML (PF) SYRINGE
PREFILLED_SYRINGE | INTRAVENOUS | Status: AC
  Filled 2023-04-08: qty 10

## 2023-04-08 MED ORDER — PROPOFOL 10 MG/ML IV BOLUS
INTRAVENOUS | Status: AC
  Filled 2023-04-08: qty 20

## 2023-04-08 MED ORDER — CHLORHEXIDINE GLUCONATE CLOTH 2 % EX PADS
6.0000 | MEDICATED_PAD | Freq: Once | CUTANEOUS | Status: AC
  Administered 2023-04-08: 6 via TOPICAL

## 2023-04-08 MED ORDER — SUGAMMADEX SODIUM 200 MG/2ML IV SOLN
INTRAVENOUS | Status: DC | PRN
  Administered 2023-04-08: 230 mg via INTRAVENOUS

## 2023-04-08 MED ORDER — KETAMINE HCL 50 MG/5ML IJ SOSY
PREFILLED_SYRINGE | INTRAMUSCULAR | Status: DC | PRN
  Administered 2023-04-08: 50 mg via INTRAVENOUS

## 2023-04-08 MED ORDER — PHENYLEPHRINE HCL-NACL 20-0.9 MG/250ML-% IV SOLN
INTRAVENOUS | Status: DC | PRN
  Administered 2023-04-08: 40 ug/min via INTRAVENOUS

## 2023-04-08 MED ORDER — 0.9 % SODIUM CHLORIDE (POUR BTL) OPTIME
TOPICAL | Status: DC | PRN
  Administered 2023-04-08: 120 mL

## 2023-04-08 MED ORDER — ORAL CARE MOUTH RINSE
15.0000 mL | Freq: Once | OROMUCOSAL | Status: AC
Start: 2023-04-08 — End: 2023-04-08

## 2023-04-08 MED ORDER — OXYCODONE HCL 5 MG/5ML PO SOLN: 5.0000 mg | Freq: Once | ORAL | Status: AC | PRN

## 2023-04-08 MED ORDER — BUPIVACAINE-EPINEPHRINE (PF) 0.25% -1:200000 IJ SOLN
INTRAMUSCULAR | Status: AC
  Filled 2023-04-08: qty 30

## 2023-04-08 MED ORDER — OXYCODONE HCL 5 MG PO TABS
5.0000 mg | ORAL_TABLET | Freq: Once | ORAL | Status: AC | PRN
  Administered 2023-04-08: 5 mg via ORAL

## 2023-04-08 MED ORDER — PHENYLEPHRINE HCL-NACL 20-0.9 MG/250ML-% IV SOLN
INTRAVENOUS | Status: AC
  Filled 2023-04-08: qty 250

## 2023-04-08 MED ORDER — CEFAZOLIN SODIUM-DEXTROSE 2-4 GM/100ML-% IV SOLN
2.0000 g | INTRAVENOUS | Status: AC
  Administered 2023-04-08: 2 g via INTRAVENOUS

## 2023-04-08 MED ORDER — FENTANYL CITRATE (PF) 100 MCG/2ML IJ SOLN
INTRAMUSCULAR | Status: AC
  Filled 2023-04-08: qty 2

## 2023-04-08 MED ORDER — BUPIVACAINE LIPOSOME 1.3 % IJ SUSP
INTRAMUSCULAR | Status: AC
  Filled 2023-04-08: qty 20

## 2023-04-08 MED ORDER — MIDAZOLAM HCL 2 MG/2ML IJ SOLN
INTRAMUSCULAR | Status: DC | PRN
  Administered 2023-04-08: 2 mg via INTRAVENOUS

## 2023-04-08 MED ORDER — CEFAZOLIN SODIUM-DEXTROSE 2-4 GM/100ML-% IV SOLN
INTRAVENOUS | Status: AC
  Filled 2023-04-08: qty 100

## 2023-04-08 MED ORDER — PHENYLEPHRINE 80 MCG/ML (10ML) SYRINGE FOR IV PUSH (FOR BLOOD PRESSURE SUPPORT)
PREFILLED_SYRINGE | INTRAVENOUS | Status: DC | PRN
  Administered 2023-04-08 (×4): 80 ug via INTRAVENOUS
  Administered 2023-04-08: 160 ug via INTRAVENOUS

## 2023-04-08 MED ORDER — OXYCODONE HCL 5 MG PO TABS: 5.0000 mg | ORAL_TABLET | ORAL | 0 refills | Status: DC | PRN

## 2023-04-08 MED ORDER — CHLORHEXIDINE GLUCONATE CLOTH 2 % EX PADS: 6.0000 | MEDICATED_PAD | Freq: Once | CUTANEOUS | Status: DC

## 2023-04-08 MED ORDER — BUPIVACAINE LIPOSOME 1.3 % IJ SUSP
INTRAMUSCULAR | Status: DC | PRN
  Administered 2023-04-08: 20 mL

## 2023-04-08 MED ORDER — LIDOCAINE HCL (CARDIAC) PF 100 MG/5ML IV SOSY
PREFILLED_SYRINGE | INTRAVENOUS | Status: DC | PRN
  Administered 2023-04-08: 100 mg via INTRAVENOUS

## 2023-04-08 MED ORDER — LACTATED RINGERS IV SOLN: INTRAVENOUS | Status: DC | PRN

## 2023-04-08 MED ORDER — INSULIN ASPART 100 UNIT/ML IJ SOLN
INTRAMUSCULAR | Status: AC
  Filled 2023-04-08: qty 1

## 2023-04-08 MED ORDER — INSULIN ASPART 100 UNIT/ML IJ SOLN
5.0000 [IU] | Freq: Once | INTRAMUSCULAR | Status: AC
  Administered 2023-04-08: 5 [IU] via SUBCUTANEOUS

## 2023-04-08 MED ORDER — GABAPENTIN 300 MG PO CAPS
300.0000 mg | ORAL_CAPSULE | ORAL | Status: AC
  Administered 2023-04-08: 300 mg via ORAL

## 2023-04-08 MED ORDER — MIDAZOLAM HCL 2 MG/2ML IJ SOLN
INTRAMUSCULAR | Status: AC
  Filled 2023-04-08: qty 2

## 2023-04-08 MED ORDER — EPHEDRINE SULFATE-NACL 50-0.9 MG/10ML-% IV SOSY
PREFILLED_SYRINGE | INTRAVENOUS | Status: DC | PRN
  Administered 2023-04-08: 10 mg via INTRAVENOUS

## 2023-04-08 MED ORDER — GABAPENTIN 300 MG PO CAPS
ORAL_CAPSULE | ORAL | Status: AC
  Filled 2023-04-08: qty 1

## 2023-04-08 MED ORDER — ROCURONIUM BROMIDE 100 MG/10ML IV SOLN
INTRAVENOUS | Status: DC | PRN
  Administered 2023-04-08: 10 mg via INTRAVENOUS
  Administered 2023-04-08: 20 mg via INTRAVENOUS
  Administered 2023-04-08: 60 mg via INTRAVENOUS

## 2023-04-08 MED ORDER — ACETAMINOPHEN 500 MG PO TABS
ORAL_TABLET | ORAL | Status: AC
  Filled 2023-04-08: qty 2

## 2023-04-08 MED ORDER — FENTANYL CITRATE (PF) 100 MCG/2ML IJ SOLN
INTRAMUSCULAR | Status: DC | PRN
  Administered 2023-04-08: 100 ug via INTRAVENOUS

## 2023-04-08 MED ORDER — DEXAMETHASONE SODIUM PHOSPHATE 10 MG/ML IJ SOLN
INTRAMUSCULAR | Status: DC | PRN
  Administered 2023-04-08: 4 mg via INTRAVENOUS

## 2023-04-08 MED ORDER — SODIUM CHLORIDE 0.9 % IV SOLN: INTRAVENOUS | Status: DC

## 2023-04-08 MED ORDER — CHLORHEXIDINE GLUCONATE 0.12 % MT SOLN
15.0000 mL | Freq: Once | OROMUCOSAL | Status: AC
  Administered 2023-04-08: 15 mL via OROMUCOSAL

## 2023-04-08 MED ORDER — CHLORHEXIDINE GLUCONATE 0.12 % MT SOLN
OROMUCOSAL | Status: AC
  Filled 2023-04-08: qty 15

## 2023-04-08 MED ORDER — ACETAMINOPHEN 500 MG PO TABS
1000.0000 mg | ORAL_TABLET | ORAL | Status: AC
  Administered 2023-04-08: 1000 mg via ORAL

## 2023-04-08 SURGICAL SUPPLY — 30 items
APPLIER CLIP 11 MED OPEN (CLIP)
APPLIER CLIP 13 LRG OPEN (CLIP)
BLADE CLIPPER SURG (BLADE) ×1 IMPLANT
CLIP APPLIE 11 MED OPEN (CLIP) ×1 IMPLANT
CLIP APPLIE 13 LRG OPEN (CLIP) ×1 IMPLANT
DRAPE LAPAROTOMY 100X77 ABD (DRAPES) ×1 IMPLANT
DRSG TELFA 3X8 NADH STRL (GAUZE/BANDAGES/DRESSINGS) ×1 IMPLANT
ELECT CAUTERY BLADE 6.4 (BLADE) ×1 IMPLANT
ELECT REM PT RETURN 9FT ADLT (ELECTROSURGICAL) ×1
ELECTRODE REM PT RTRN 9FT ADLT (ELECTROSURGICAL) ×1 IMPLANT
GAUZE 4X4 16PLY ~~LOC~~+RFID DBL (SPONGE) ×1 IMPLANT
GAUZE SPONGE 4X4 12PLY STRL (GAUZE/BANDAGES/DRESSINGS) ×1 IMPLANT
GLOVE BIO SURGEON STRL SZ7 (GLOVE) ×1 IMPLANT
GOWN STRL REUS W/ TWL LRG LVL3 (GOWN DISPOSABLE) ×2 IMPLANT
MANIFOLD NEPTUNE II (INSTRUMENTS) ×1 IMPLANT
MESH VENTRALEX ST 2.5 CRC MED (Mesh General) IMPLANT
NDL HYPO 22X1.5 SAFETY MO (MISCELLANEOUS) ×1 IMPLANT
NEEDLE HYPO 22X1.5 SAFETY MO (MISCELLANEOUS) ×1
NS IRRIG 500ML POUR BTL (IV SOLUTION) IMPLANT
PACK BASIN MINOR ARMC (MISCELLANEOUS) ×1 IMPLANT
SPONGE T-LAP 18X18 ~~LOC~~+RFID (SPONGE) ×1 IMPLANT
STAPLER SKIN PROX 35W (STAPLE) ×1 IMPLANT
SUT ETHIBOND 0 MO6 C/R (SUTURE) ×1 IMPLANT
SUT MNCRL 4-0 27 PS-2 XMFL (SUTURE) ×1
SUT MNCRL 4-0 27XMFL (SUTURE) ×1
SUT SILK 2 0SH CR/8 30 (SUTURE) IMPLANT
SUT VIC AB 2-0 SH 27XBRD (SUTURE) ×2 IMPLANT
SUTURE MNCRL 4-0 27XMF (SUTURE) IMPLANT
SYR 20ML LL LF (SYRINGE) ×1 IMPLANT
TRAP FLUID SMOKE EVACUATOR (MISCELLANEOUS) ×1 IMPLANT

## 2023-04-08 NOTE — Anesthesia Procedure Notes (Signed)
Procedure Name: Intubation Date/Time: 04/08/2023 10:15 AM  Performed by: Darrell Jewel I, CRNAPre-anesthesia Checklist: Patient identified, Emergency Drugs available, Suction available and Patient being monitored Patient Re-evaluated:Patient Re-evaluated prior to induction Oxygen Delivery Method: Circle system utilized Preoxygenation: Pre-oxygenation with 100% oxygen Induction Type: IV induction Ventilation: Mask ventilation without difficulty Laryngoscope Size: McGrath and 4 Grade View: Grade I Tube type: Oral Number of attempts: 1 Airway Equipment and Method: Stylet and Oral airway Placement Confirmation: ETT inserted through vocal cords under direct vision, positive ETCO2 and breath sounds checked- equal and bilateral Secured at: 23 cm Tube secured with: Tape Dental Injury: Teeth and Oropharynx as per pre-operative assessment  Comments: LTA used

## 2023-04-08 NOTE — Anesthesia Preprocedure Evaluation (Signed)
Anesthesia Evaluation  Patient identified by MRN, date of birth, ID band Patient awake    Reviewed: Allergy & Precautions, NPO status , Patient's Chart, lab work & pertinent test results  Airway Mallampati: III  TM Distance: <3 FB Neck ROM: full    Dental  (+) Missing   Pulmonary shortness of breath and with exertion, asthma , sleep apnea , COPD, former smoker   Pulmonary exam normal        Cardiovascular Exercise Tolerance: Good hypertension, + CAD  Normal cardiovascular exam     Neuro/Psych  PSYCHIATRIC DISORDERS       Neuromuscular disease    GI/Hepatic Neg liver ROS, hiatal hernia,GERD  Controlled,,  Endo/Other  negative endocrine ROSdiabetes    Renal/GU Renal disease     Musculoskeletal   Abdominal   Peds  Hematology negative hematology ROS (+)   Anesthesia Other Findings Patient has cardiac clearance for this procedure.   Past Medical History: No date: Allergy No date: Asthma No date: Chronic lower back pain     Comment:  a.) followed by pain management; on COT No date: COPD (chronic obstructive pulmonary disease) (HCC) No date: Coronary artery disease     Comment:  a.) cCTA 06/30/2021: Ca2+ = 216 (84th %ile; 25049% pRCA               and pLAD)) No date: DDD (degenerative disc disease), lumbosacral No date: Depression No date: Diabetic peripheral neuropathy (HCC) 12/27/2020: Diastolic dysfunction     Comment:  a.) TTE 12/27/2020: EF >55%, no RWMAs, G1DD, norm RVSF,               triv MR/TR?PR No date: Diverticulosis No date: Erectile dysfunction     Comment:  a.) on PDE5i (tadalafil) No date: GERD (gastroesophageal reflux disease) No date: Hepatic steatosis No date: History of kidney stones No date: Hyperlipidemia No date: Hypertension No date: Hypogonadism male     Comment:  a.) on exogenous TRT (depotestosterone cypionate) No date: IDA (iron deficiency anemia) No date: Long term current use  of opiate analgesic     Comment:  a.) followed by pain management; naloxone Rx available No date: OSA on CPAP No date: Paraesophageal hernia     Comment:  a.) s/p robotic assisted repair 01/2022 No date: PVD (peripheral vascular disease) (HCC) No date: T2DM (type 2 diabetes mellitus) (HCC) No date: Umbilical hernia     Comment:  a.) s/p repair 01/20/2022 No date: Ventral hernia  Past Surgical History: No date: APPENDECTOMY 2016: BRONCHOSCOPY No date: CIRCUMCISION 12/31/2021: COLONOSCOPY WITH PROPOFOL; N/A     Comment:  Procedure: COLONOSCOPY WITH PROPOFOL;  Surgeon: Wyline Mood, MD;  Location: [facility] ENDOSCOPY;  Service:               Gastroenterology;  Laterality: N/A; 12/31/2021: ESOPHAGOGASTRODUODENOSCOPY; N/A     Comment:  Procedure: ESOPHAGOGASTRODUODENOSCOPY (EGD);  Surgeon:               Wyline Mood, MD;  Location: [facility] ENDOSCOPY;  Service:               Gastroenterology;  Laterality: N/A; 02/04/2015: INCISION AND DRAINAGE PERIRECTAL ABSCESS; N/A     Comment:  Procedure: IRRIGATION AND DEBRIDEMENT PERIRECTAL               ABSCESS;  Surgeon: Ida Rogue, MD;  Location:  [facility] ORS;  Service: General;  Laterality: N/A; 01/20/2022: INSERTION OF MESH     Comment:  Procedure: INSERTION OF MESH;  Surgeon: Leafy Ro,               MD;  Location: [facility] ORS;  Service: General;; No date: KIDNEY STONE SURGERY; Right No date: lung mass removal; N/A No date: MUSCLE BIOPSY 03/23/2017: ORIF ANKLE FRACTURE; Left     Comment:  Procedure: OPEN REDUCTION INTERNAL FIXATION (ORIF) ANKLE              FRACTURE;  Surgeon: Signa Kell, MD;  Location: [facility]               ORS;  Service: Orthopedics;  Laterality: Left; 02/04/2015: RECTAL EXAM UNDER ANESTHESIA     Comment:  Procedure: RECTAL EXAM UNDER ANESTHESIA;  Surgeon:               Ida Rogue, MD;  Location: [facility] ORS;  Service:              General;; 03/23/2017: SYNDESMOSIS REPAIR; Left      Comment:  Procedure: SYNDESMOSIS REPAIR;  Surgeon: Signa Kell,               MD;  Location: [facility] ORS;  Service: Orthopedics;                Laterality: Left; 01/20/2022: UMBILICAL HERNIA REPAIR     Comment:  Procedure: HERNIA REPAIR UMBILICAL ADULT;  Surgeon:               Leafy Ro, MD;  Location: [facility] ORS;  Service:               General;; No date: VASECTOMY 01/20/2022: XI ROBOTIC ASSISTED PARAESOPHAGEAL HERNIA REPAIR; N/A     Comment:  Procedure: XI ROBOTIC ASSISTED PARAESOPHAGEAL HERNIA               REPAIR, CONVERTED TO OPEN, RNFA to assist;  Surgeon:               Leafy Ro, MD;  Location: [facility] ORS;  Service:               General;  Laterality: N/A;  BMI    Body Mass Index: 31.46 kg/m      Reproductive/Obstetrics negative OB ROS                             Anesthesia Physical Anesthesia Plan  ASA: 3  Anesthesia Plan: General ETT   Post-op Pain Management:    Induction: Intravenous  PONV Risk Score and Plan: Ondansetron, Dexamethasone, Midazolam and Treatment may vary due to age or medical condition  Airway Management Planned: Oral ETT  Additional Equipment:   Intra-op Plan:   Post-operative Plan: Extubation in OR  Informed Consent: I have reviewed the patients History and Physical, chart, labs and discussed the procedure including the risks, benefits and alternatives for the proposed anesthesia with the patient or authorized representative who has indicated his/her understanding and acceptance.     Dental Advisory Given  Plan Discussed with: Anesthesiologist, CRNA and Surgeon  Anesthesia Plan Comments: (Patient consented for risks of anesthesia including but not limited to:  - adverse reactions to medications - damage to eyes, teeth, lips or other oral mucosa - nerve damage due to positioning  - sore throat or hoarseness - Damage to heart, brain, nerves, lungs, other parts of body or  loss of life  Patient voiced  understanding and assent.)       Anesthesia Quick Evaluation

## 2023-04-08 NOTE — Discharge Instructions (Signed)
Open Hernia Repair, Adult, Care After After an open hernia repair, it's common to have: Pain in your belly. Slight bruising. A little swelling. A small amount of blood from the cut from surgery. Follow these instructions at home: Medicines Take your medicines only as told by your health care provider. If told, take steps to prevent trouble pooping. You may need to: Drink enough fluid to keep your pee (urine) pale yellow. Take medicines to help you poop. Eat foods high in fiber. These include beans, whole grains, and fresh fruits and vegetables. Ask your provider if you should avoid driving or using machines while you're taking your medicine. If you were given antibiotics, take them as told by your provider. Do not stop taking them even if you start to feel better. Incision care  Take care of your cut from surgery as told by your provider. Make sure you: Wash your hands with soap and water for at least 20 seconds before and after you change your bandage. If you can't use soap and water, use hand sanitizer. Change your bandage. Leave stitches or skin glue in place for at least 2 weeks. Leave tape strips alone unless you're told to take them off. You may trim the edges of the tape strips if they curl up. Check the area around your cut every day for signs of infection. Check for: More redness, swelling, or pain. More fluid or blood. Warmth. Pus or a bad smell. Wear loose clothes while your cut heals. Activity Rest as told. You may have to avoid lifting. Ask how much weight you can safely lift. Do not play contact sports until your provider says it's safe. Return to normal activities when you're told. Ask what things are safe for you to do. General instructions If you were given a sedative during your surgery, do not drive or use machines until you're told it's safe. A sedative can make you sleepy. Do not take baths, swim, or use a hot tub until told. Ask if showers are okay. You may  need to take sponge baths only. Hold a pillow over your belly when you cough or sneeze. This helps with pain. Do not smoke, vape, or use products with nicotine or tobacco in them. If you need help quitting, talk with your provider. Your provider may give you more instructions. Make sure you know what you can and can't do. Contact a health care provider if: You have signs of infection. You have a fever or chills. You have blood in your poop. You haven't pooped in 2-3 days. Your pain doesn't get better with medicine. Get help right away if: You have chest pain. You're short of breath. You feel faint or light-headed. You have very bad pain. You start to vomit. You have pain, swelling, or redness in a leg. These symptoms may be an emergency. Call 911 right away. Do not wait to see if the symptoms will go away. Do not drive yourself to the hospital. This information is not intended to replace advice given to you by your health care provider. Make sure you discuss any questions you have with your health care provider. Document Revised: 12/03/2022 Document Reviewed: 05/26/2022 Elsevier Patient Education  2024 [employer].

## 2023-04-08 NOTE — Op Note (Signed)
  Ventral Hernia Repair with Mesh 6.4 cms Ventralex  Pre-operative Diagnosis: ventral hernia  Post-operative Diagnosis: same  Surgeon: Sterling Big, MD FACS  Anesthesia: Gen. with endotracheal tube   Findings:  3cms  chronically incarcerated hernia  Estimated Blood Loss: 10cc                Specimens: sac          Complications: none              Procedure Details  The patient was seen again in the Holding Room. The benefits, complications, treatment options, and expected outcomes were discussed with the patient. The risks of bleeding, infection, recurrence of symptoms, failure to resolve symptoms, bowel injury, mesh placement, mesh infection, any of which could require further surgery were reviewed with the patient. The likelihood of improving the patient's symptoms with return to their baseline status is good.  The patient and/or family concurred with the proposed plan, giving informed consent.  The patient was taken to Operating Room, identified and the procedure verified.  A Time Out was held and the above information confirmed.  Prior to the induction of general anesthesia, antibiotic prophylaxis was administered. VTE prophylaxis was in place. General endotracheal anesthesia was then administered and tolerated well. After the induction, the abdomen was prepped with Chloraprep and draped in the sterile fashion. The patient was positioned in the supine position.  Incision was created with a scalpel over the hernia defect. Electrocautery was used to dissect through subcutaneous tissue, the hernia sac was opened excised.Omentum was found chronically incarcerated and was reduced back to the abdominal cavity. We were able to clean the fascial edges and lyse some omental adhesions that were attached the abdominal wall, There was small bowel that was attached to the omenum we did sharp lysis of adhesion , there was small serosal tear that was imbricated w a few lambert 2-0 silks. No full  thickness bowel injuries observed. liposomal Marcaine was used to injected bilaterally to perform TAP Block under bidigital guidance.  The hernia was measured. A BARD 6.4cm Ventralex  patch selected and placed in an underlay fashion . I used 6 transfacial sutures to  secure to the fascia circumferentially. The mesh laid very nicely against the abdominal wall w/o any kinks or wrinkles. I closed the hernia defect with interrupted 0 Ethibond sutures.   Incision was closed in a 2 layer fashion with 3-0 Vicryl and 4-0 Monocryl. Dermabond was used to coat the skin. Patient tolerated procedure well and there were no immediate complications. Needle and laparotomy counts were correct

## 2023-04-08 NOTE — Interval H&P Note (Signed)
History and Physical Interval Note:  04/08/2023 9:37 AM  Darren Allen  has presented today for surgery, with the diagnosis of ventral hernia 3 cm.  The various methods of treatment have been discussed with the patient and family. After consideration of risks, benefits and other options for treatment, the patient has consented to  Procedure(s): HERNIA REPAIR VENTRAL ADULT, open, RNFA to assist (N/A) as a surgical intervention.  The patient's history has been reviewed, patient examined, no change in status, stable for surgery.  I have reviewed the patient's chart and labs.  Questions were answered to the patient's satisfaction.     Shirlean Berman F Milyn Stapleton

## 2023-04-08 NOTE — Transfer of Care (Signed)
Immediate Anesthesia Transfer of Care Note  Patient: Darren Allen  Procedure(s) Performed: HERNIA REPAIR VENTRAL ADULT, open, RNFA to assist (Abdomen)  Patient Location: PACU  Anesthesia Type:General  Level of Consciousness: awake, alert , and oriented  Airway & Oxygen Therapy: Patient Spontanous Breathing and Patient connected to face mask oxygen  Post-op Assessment: Report given to RN, Post -op Vital signs reviewed and stable, and Patient moving all extremities X 4  Post vital signs: Reviewed and stable  Last Vitals:  Vitals Value Taken Time  BP 120/77 04/08/23 1156  Temp    Pulse 92 04/08/23 1200  Resp 9 04/08/23 1200  SpO2 100 % 04/08/23 1200  Vitals shown include unfiled device data.  Last Pain:  Vitals:   04/08/23 0758  TempSrc: Temporal  PainSc: 3          Complications: No notable events documented.

## 2023-04-08 NOTE — Anesthesia Postprocedure Evaluation (Signed)
Anesthesia Post Note  Patient: Darren Allen  Procedure(s) Performed: HERNIA REPAIR VENTRAL ADULT, open (Abdomen) INSERTION OF MESH (Abdomen)  Patient location during evaluation: PACU Anesthesia Type: General Level of consciousness: awake and alert Pain management: pain level controlled Vital Signs Assessment: post-procedure vital signs reviewed and stable Respiratory status: spontaneous breathing, nonlabored ventilation, respiratory function stable and patient connected to nasal cannula oxygen Cardiovascular status: blood pressure returned to baseline and stable Postop Assessment: no apparent nausea or vomiting Anesthetic complications: no   No notable events documented.   Last Vitals:  Vitals:   04/08/23 1245 04/08/23 1306  BP: 105/62 129/82  Pulse: 82 89  Resp: 12 17  Temp: 36.9 C 36.8 C  SpO2: 97% 99%    Last Pain:  Vitals:   04/08/23 1306  TempSrc: Temporal  PainSc: 4                  Cleda Mccreedy Abdoulaye Drum

## 2023-04-09 ENCOUNTER — Encounter: Payer: Self-pay | Admitting: Surgery

## 2023-04-09 LAB — SURGICAL PATHOLOGY

## 2023-04-15 ENCOUNTER — Ambulatory Visit: Payer: 59 | Admitting: Dermatology

## 2023-04-19 ENCOUNTER — Encounter: Payer: Self-pay | Admitting: Surgery

## 2023-04-19 ENCOUNTER — Ambulatory Visit (INDEPENDENT_AMBULATORY_CARE_PROVIDER_SITE_OTHER): Payer: 59 | Admitting: Surgery

## 2023-04-19 VITALS — BP 165/79 | HR 112 | Temp 98.9°F | Ht 74.0 in | Wt 244.0 lb

## 2023-04-19 DIAGNOSIS — Z09 Encounter for follow-up examination after completed treatment for conditions other than malignant neoplasm: Secondary | ICD-10-CM | POA: Diagnosis not present

## 2023-04-19 DIAGNOSIS — K430 Incisional hernia with obstruction, without gangrene: Secondary | ICD-10-CM | POA: Diagnosis not present

## 2023-04-19 NOTE — Progress Notes (Signed)
Outpatient Surgical Follow Up  04/19/2023  Darren Allen is an 58 y.o. male.   Chief Complaint  Patient presents with   Routine Post Op    Ventral hernia 04/08/2023    HPI: s/p ventral hernia repair w mesh 1/23, doing well, some soreness. No fevers or chills. Ambulating normally. Tolerating PO.  Past Medical History:  Diagnosis Date   Allergy    Asthma    Chronic lower back pain    a.) followed by pain management; on COT   COPD (chronic obstructive pulmonary disease) (HCC)    Coronary artery disease    a.) cCTA 06/30/2021: Ca2+ = 216 (84th %ile; 25049% pRCA and pLAD))   DDD (degenerative disc disease), lumbosacral    Depression    Diabetic peripheral neuropathy (HCC)    Diastolic dysfunction 12/27/2020   a.) TTE 12/27/2020: EF >55%, no RWMAs, G1DD, norm RVSF, triv MR/TR?PR   Diverticulosis    Erectile dysfunction    a.) on PDE5i (tadalafil)   GERD (gastroesophageal reflux disease)    Hepatic steatosis    History of kidney stones    Hyperlipidemia    Hypertension    Hypogonadism male    a.) on exogenous TRT (depotestosterone cypionate)   IDA (iron deficiency anemia)    Long term current use of opiate analgesic    a.) followed by pain management; naloxone Rx available   OSA on CPAP    Paraesophageal hernia    a.) s/p robotic assisted repair 01/2022   PVD (peripheral vascular disease) (HCC)    T2DM (type 2 diabetes mellitus) (HCC)    Umbilical hernia    a.) s/p repair 01/20/2022   Ventral hernia     Past Surgical History:  Procedure Laterality Date   APPENDECTOMY     BRONCHOSCOPY  2016   CIRCUMCISION     COLONOSCOPY WITH PROPOFOL N/A 12/31/2021   Procedure: COLONOSCOPY WITH PROPOFOL;  Surgeon: Wyline Mood, MD;  Location: [facility] ENDOSCOPY;  Service: Gastroenterology;  Laterality: N/A;   ESOPHAGOGASTRODUODENOSCOPY N/A 12/31/2021   Procedure: ESOPHAGOGASTRODUODENOSCOPY (EGD);  Surgeon: Wyline Mood, MD;  Location: [facility] ENDOSCOPY;  Service: Gastroenterology;   Laterality: N/A;   INCISION AND DRAINAGE PERIRECTAL ABSCESS N/A 02/04/2015   Procedure: IRRIGATION AND DEBRIDEMENT PERIRECTAL ABSCESS;  Surgeon: Ida Rogue, MD;  Location: [facility] ORS;  Service: General;  Laterality: N/A;   INSERTION OF MESH  01/20/2022   Procedure: INSERTION OF MESH;  Surgeon: Leafy Ro, MD;  Location: [facility] ORS;  Service: General;;   INSERTION OF MESH N/A 04/08/2023   Procedure: INSERTION OF MESH;  Surgeon: Leafy Ro, MD;  Location: [facility] ORS;  Service: General;  Laterality: N/A;   KIDNEY STONE SURGERY Right    lung mass removal N/A    MUSCLE BIOPSY     ORIF ANKLE FRACTURE Left 03/23/2017   Procedure: OPEN REDUCTION INTERNAL FIXATION (ORIF) ANKLE FRACTURE;  Surgeon: Signa Kell, MD;  Location: [facility] ORS;  Service: Orthopedics;  Laterality: Left;   RECTAL EXAM UNDER ANESTHESIA  02/04/2015   Procedure: RECTAL EXAM UNDER ANESTHESIA;  Surgeon: Ida Rogue, MD;  Location: [facility] ORS;  Service: General;;   SYNDESMOSIS REPAIR Left 03/23/2017   Procedure: SYNDESMOSIS REPAIR;  Surgeon: Signa Kell, MD;  Location: [facility] ORS;  Service: Orthopedics;  Laterality: Left;   UMBILICAL HERNIA REPAIR  01/20/2022   Procedure: HERNIA REPAIR UMBILICAL ADULT;  Surgeon: Leafy Ro, MD;  Location: [facility] ORS;  Service: General;;   VASECTOMY     VENTRAL HERNIA REPAIR N/A 04/08/2023  Procedure: HERNIA REPAIR VENTRAL ADULT, open;  Surgeon: Leafy Ro, MD;  Location: [facility] ORS;  Service: General;  Laterality: N/A;   XI ROBOTIC ASSISTED PARAESOPHAGEAL HERNIA REPAIR N/A 01/20/2022   Procedure: XI ROBOTIC ASSISTED PARAESOPHAGEAL HERNIA REPAIR, CONVERTED TO OPEN, RNFA to assist;  Surgeon: Leafy Ro, MD;  Location: [facility] ORS;  Service: General;  Laterality: N/A;    Family History  Problem Relation Age of Onset   Cancer Mother 25       Lung   Cancer Father        Colon   Heart disease Father    Alcohol abuse Father    Cancer Brother 75       Esophageal   Diabetes  Brother    Heart disease Brother     Social History:  reports that he has quit smoking. His smoking use included cigarettes. He has a 17 pack-year smoking history. He has been exposed to tobacco smoke. He has never used smokeless tobacco. He reports that he does not currently use alcohol after a past usage of about 12.0 standard drinks of alcohol per week. He reports that he does not use drugs.  Allergies:  Allergies  Allergen Reactions   Glipizide Other (See Comments) and Palpitations    Shaky, feel bad Other reaction(s): Dizziness   Cephalexin Rash   Duloxetine Anxiety and Nausea Only   Dupixent [Dupilumab] Rash    Medications reviewed.    ROS Full ROS performed and is otherwise negative other than what is stated in HPI   BP (!) 165/79   Pulse (!) 112   Temp 98.9 F (37.2 C) (Oral)   Ht 6\' 2"  (1.88 m)   Wt 244 lb (110.7 kg)   SpO2 97%   BMI 31.33 kg/m   Physical Exam Vitals and nursing note reviewed. Exam conducted with a chaperone present.  Constitutional:      General: He is not in acute distress.    Appearance: Normal appearance.  Eyes:     General: No scleral icterus.       Right eye: No discharge.        Left eye: No discharge.  Pulmonary:     Effort: Pulmonary effort is normal.     Breath sounds: No stridor.  Abdominal:     General: Abdomen is flat. There is no distension.     Palpations: Abdomen is soft. There is no mass.     Tenderness: There is no abdominal tenderness. There is no guarding or rebound.     Hernia: No hernia is present.     Comments: Wound healing well, No evidence of infection , no recurrence   Skin:    General: Skin is warm and dry.     Capillary Refill: Capillary refill takes less than 2 seconds.     Coloration: Skin is not jaundiced.  Neurological:     General: No focal deficit present.     Mental Status: He is alert and oriented to person, place, and time.  Psychiatric:        Mood and Affect: Mood normal.        Behavior:  Behavior normal.        Thought Content: Thought content normal.        Judgment: Judgment normal.    Assessment/Plan: Doing Very well from recent surgery without complications  continue lifting restrictions. DC as needed D/W Dr Laban Emperor ( Pain Management)  Sterling Big, MD [facility] General Surgeon

## 2023-04-19 NOTE — Patient Instructions (Signed)

## 2023-04-29 ENCOUNTER — Ambulatory Visit: Payer: Self-pay

## 2023-04-29 ENCOUNTER — Encounter: Payer: Self-pay | Admitting: Nurse Practitioner

## 2023-04-29 ENCOUNTER — Ambulatory Visit: Payer: 59 | Admitting: Nurse Practitioner

## 2023-04-29 VITALS — BP 136/82 | HR 102 | Ht 74.0 in | Wt 251.0 lb

## 2023-04-29 DIAGNOSIS — L0292 Furuncle, unspecified: Secondary | ICD-10-CM

## 2023-04-29 MED ORDER — METRONIDAZOLE 1 % EX GEL: Freq: Every day | CUTANEOUS | 0 refills | Status: DC

## 2023-04-29 NOTE — Telephone Encounter (Signed)
  Chief Complaint: Boils on face and back Symptoms: boils Frequency: a few days Pertinent Negatives: Patient denies fever Disposition: [] ED /[] Urgent Care (no appt availability in office) / [x] Appointment(In office/virtual)/ []  [place] [facility]/ [] Home Care/ [] Refused Recommended Disposition /[] [facility]/ []  Follow-up with PCP Additional Notes: Pt states that he has had these same boils in the past and provider has prescribed an oral sulfa ABX. Pt would like to be worked into schedule today if possible, so he can get ABX. If unable to work pt in please return his call. He will go to UC if needed.    Reason for Disposition  2 or more boils  Answer Assessment - Initial Assessment Questions 1. APPEARANCE of RASH: "Describe the rash." (e.g., spots, blisters, raised areas, skin peeling, scaly)     Looks like boils 2. SIZE: "How big are the spots?" (e.g., tip of pen, eraser, coin; inches, centimeters)     Face and back - boils  3. LOCATION: "Where is the rash located?"     Face and back 4. COLOR: "What color is the rash?" (Note: It is difficult to assess rash color in people with darker-colored skin. When this situation occurs, simply ask the caller to describe what they see.)     red 5. ONSET: "When did the rash begin?"     A couple of days. 6. FEVER: "Do you have a fever?" If Yes, ask: "What is your temperature, how was it measured, and when did it start?"     no 7. ITCHING: "Does the rash itch?" If Yes, ask: "How bad is the itch?" (Scale 1-10; or mild, moderate, severe)     no 8. CAUSE: "What do you think is causing the rash?"     Bacterial infection 9. MEDICINE FACTORS: "Have you started any new medicines within the last 2 weeks?" (e.g., antibiotics)      no 10. OTHER SYMPTOMS: "Do you have any other symptoms?" (e.g., dizziness, headache, sore throat, joint pain)       no  Answer Assessment - Initial Assessment Questions 1. APPEARANCE of BOIL: "What does the  boil look like?"      red 2. LOCATION: "Where is the boil located?"      Face and back  5. ONSET: "When did the boil start?"     A few days 6. PAIN: "Is there any pain?" If Yes, ask: "How bad is the pain?"   (Scale 1-10; or mild, moderate, severe)     yes 7. FEVER: "Do you have a fever?" If Yes, ask: "What is it, how was it measured, and when did it start?"      no 9. OTHER SYMPTOMS: "Do you have any other symptoms?" (e.g., shaking chills, weakness, rash elsewhere on body)     fatigue  Protocols used: Rash or Redness - Widespread-A-AH, Boil (Skin Abscess)-A-AH

## 2023-04-29 NOTE — Progress Notes (Signed)
BP 136/82 (BP Location: Left Arm, Patient Position: Sitting, Cuff Size: Normal)   Pulse (!) 102   Ht 6\' 2"  (1.88 m)   Wt 251 lb (113.9 kg)   SpO2 98%   BMI 32.23 kg/m    Subjective:    Patient ID: Darren Allen, male    DOB: 1965-11-03, 58 y.o.   MRN: [id number]  HPI: Darren Allen is a 58 y.o. male  Chief Complaint  Patient presents with   boils    Re-current Redness, painful on the face/right/head upper back--2 days   Patient states he is having recurrent boils again.  He has multiple on his face and one on his upper right back.  The one on the back started two days ago.  The ones on the face started yesterday.  He does have an appt with Dermatology in March.     Relevant past medical, surgical, family and social history reviewed and updated as indicated. Interim medical history since our last visit reviewed. Allergies and medications reviewed and updated.  Review of Systems  Skin:        Multiple pimples on face and one on right shoulder    Per HPI unless specifically indicated above     Objective:    BP 136/82 (BP Location: Left Arm, Patient Position: Sitting, Cuff Size: Normal)   Pulse (!) 102   Ht 6\' 2"  (1.88 m)   Wt 251 lb (113.9 kg)   SpO2 98%   BMI 32.23 kg/m   Wt Readings from Last 3 Encounters:  04/29/23 251 lb (113.9 kg)  04/19/23 244 lb (110.7 kg)  04/08/23 245 lb (111.1 kg)    Physical Exam Vitals and nursing note reviewed.  Constitutional:      General: He is not in acute distress.    Appearance: Normal appearance. He is not ill-appearing, toxic-appearing or diaphoretic.  HENT:     Head: Normocephalic.     Right Ear: External ear normal.     Left Ear: External ear normal.     Nose: Nose normal. No congestion or rhinorrhea.     Mouth/Throat:     Mouth: Mucous membranes are moist.  Eyes:     General:        Right eye: No discharge.        Left eye: No discharge.     Extraocular Movements: Extraocular movements intact.      Conjunctiva/sclera: Conjunctivae normal.     Pupils: Pupils are equal, round, and reactive to light.  Cardiovascular:     Rate and Rhythm: Normal rate and regular rhythm.     Heart sounds: No murmur heard. Pulmonary:     Effort: Pulmonary effort is normal. No respiratory distress.     Breath sounds: Normal breath sounds. No wheezing, rhonchi or rales.  Abdominal:     General: Abdomen is flat. Bowel sounds are normal.  Musculoskeletal:     Cervical back: Normal range of motion and neck supple.  Skin:    General: Skin is warm and dry.     Capillary Refill: Capillary refill takes less than 2 seconds.       Neurological:     General: No focal deficit present.     Mental Status: He is alert and oriented to person, place, and time.  Psychiatric:        Mood and Affect: Mood normal.        Behavior: Behavior normal.        Thought Content:  Thought content normal.        Judgment: Judgment normal.     Results for orders placed or performed during the hospital encounter of 04/08/23  Surgical pathology   Collection Time: 04/08/23 12:00 AM  Result Value Ref Range   SURGICAL PATHOLOGY      SURGICAL PATHOLOGY [facility] [address], [address] [place], [place] [address] Telephone [phone] or [phone] Fax [phone]  REPORT OF SURGICAL PATHOLOGY   Accession #: [id number] Patient Name: Darren Allen Visit # : [id number]  MRN: [id number] Physician: Sterling Big DOB/Age [birth year] (Age: 19) Gender: M Collected Date: 04/08/2023 Received Date: 04/08/2023  FINAL DIAGNOSIS       1. Hernia sac, ventral :       - MESOTHELIAL-LINED FIBROADIPOSE TISSUE COMPATIBLE WITH PROVIDED HISTORY OF      HERNIA SAC.      - POLARIZABLE FOREIGN MATERIAL AND MULTINUCLEATED GIANT CELL CONSISTENT WITH      CHANGES FROM PRIOR SURGERY.      - NEGATIVE FOR MALIGNANCY.       DATE SIGNED OUT: 04/09/2023 ELECTRONIC SIGNATURE : Oneita Kras Md, Delice Bison , Pathologist,  Electronic Signature  MICROSCOPIC DESCRIPTION  CASE COMMENTS STAINS USED IN DIAGNOSIS: H&E    CLINICAL HISTORY  SPECIMEN(S) OBTAINED 1. Hernia sac, Ventral  SPECIMEN COMM ENTS: SPECIMEN CLINICAL INFORMATION: 1. ventral hernia 3cm    Gross Description 1. "Ventral hernia sac", received fresh and placed in formalin is a 3.8 x 2.5 x 1.1 cm aggregate of adipose. Sectioning reveals a soft, lobulated cut surface with focal fibrous areas and without lesions. Representative cross-sections are submitted in block 1A.      SMB      04/08/23        Report signed out from the following location(s) [place] [address], [place] [address] CLIA #: [id number]  [facility] [address] [place], [place] [address] CLIA #: [id number]   Glucose, capillary   Collection Time: 04/08/23  7:59 AM  Result Value Ref Range   Glucose-Capillary 227 (H) 70 - 99 mg/dL   Comment 1 Notify RN    Comment 2 Document in Chart       Assessment & Plan:   Problem List Items Addressed This Visit   None Visit Diagnoses       Recurrent boils    -  Primary   Suspect rosacea element.  Will treat with Metronidazole gel.  Discussed how to appy. Follow up in 2 weeks.  Call sooner if concerns arise.        Follow up plan: Return in about 2 weeks (around 05/13/2023) for Follow up boils.

## 2023-04-29 NOTE — Telephone Encounter (Signed)
Appt scheduled 04/29/2023 at 2:00 PM

## 2023-05-04 ENCOUNTER — Ambulatory Visit: Payer: 59 | Admitting: Family Medicine

## 2023-05-10 ENCOUNTER — Other Ambulatory Visit: Payer: Self-pay | Admitting: Emergency Medicine

## 2023-05-10 DIAGNOSIS — Z87891 Personal history of nicotine dependence: Secondary | ICD-10-CM

## 2023-05-10 DIAGNOSIS — Z122 Encounter for screening for malignant neoplasm of respiratory organs: Secondary | ICD-10-CM

## 2023-05-10 DIAGNOSIS — J4489 Other specified chronic obstructive pulmonary disease: Secondary | ICD-10-CM

## 2023-05-12 ENCOUNTER — Ambulatory Visit
Admission: EM | Admit: 2023-05-12 | Discharge: 2023-05-12 | Disposition: A | Discharge status: Home / Self Care | Payer: 59 | Attending: Family Medicine | Admitting: Family Medicine

## 2023-05-12 DIAGNOSIS — J069 Acute upper respiratory infection, unspecified: Secondary | ICD-10-CM | POA: Diagnosis present

## 2023-05-12 LAB — RESP PANEL BY RT-PCR (RSV, FLU A&B, COVID)  RVPGX2
Influenza A by PCR: NEGATIVE
Influenza B by PCR: NEGATIVE
Resp Syncytial Virus by PCR: NEGATIVE
SARS Coronavirus 2 by RT PCR: NEGATIVE

## 2023-05-12 MED ORDER — IPRATROPIUM BROMIDE 0.06 % NA SOLN: 2.0000 | Freq: Four times a day (QID) | NASAL | 12 refills | Status: AC

## 2023-05-12 NOTE — ED Triage Notes (Signed)
 Pt c/o head/sinus pressure,cough & bilateral eye redness x3 days.

## 2023-05-12 NOTE — ED Provider Notes (Signed)
 [facility]    CSN: [id number] Arrival date & time: 05/12/23  1341      History   Chief Complaint Chief Complaint  Patient presents with   Sinus Problem   Cough   Eye Problem    HPI KEDARIUS ALOISI is a 58 y.o. male.   HPI  History obtained from the patient. Netanel presents for nasal congestion, cough, rhinorrhea, bilateral red eyes, lightheadedness with headache that started 3 days ago. Nothing have taken for his symptoms. No vomiting, diarrhea or diarrhea.  His fiance is well.    Red sores on his face that his PCP gave him some antibiotic gel which helped.       Past Medical History:  Diagnosis Date   Allergy    Asthma    Chronic lower back pain    a.) followed by pain management; on COT   COPD (chronic obstructive pulmonary disease) (HCC)    Coronary artery disease    a.) cCTA 06/30/2021: Ca2+ = 216 (84th %ile; 25049% pRCA and pLAD))   DDD (degenerative disc disease), lumbosacral    Depression    Diabetic peripheral neuropathy (HCC)    Diastolic dysfunction 12/27/2020   a.) TTE 12/27/2020: EF >55%, no RWMAs, G1DD, norm RVSF, triv MR/TR?PR   Diverticulosis    Erectile dysfunction    a.) on PDE5i (tadalafil)   GERD (gastroesophageal reflux disease)    Hepatic steatosis    History of kidney stones    Hyperlipidemia    Hypertension    Hypogonadism male    a.) on exogenous TRT (depotestosterone cypionate)   IDA (iron deficiency anemia)    Long term current use of opiate analgesic    a.) followed by pain management; naloxone Rx available   OSA on CPAP    Paraesophageal hernia    a.) s/p robotic assisted repair 01/2022   PVD (peripheral vascular disease) (HCC)    T2DM (type 2 diabetes mellitus) (HCC)    Umbilical hernia    a.) s/p repair 01/20/2022   Ventral hernia     Patient Active Problem List   Diagnosis Date Noted   Incisional hernia, without obstruction or gangrene 04/08/2023   Lumbar facet joint pain 02/17/2023   Spondylosis  without myelopathy or radiculopathy, lumbosacral region 02/17/2023   Chronic lower extremity pain (intermittent) (Right) 12/24/2022   Chronic lower extremity pain (intermittent) (Left) 12/24/2022   Lumbar radiculitis (Right) 12/24/2022   Lumbar radiculitis (Left) 12/24/2022   Boil of buttock 12/24/2022   Chronic low back pain (Bilateral) w/ sciatica (Right) 12/23/2022   Acne vulgaris 09/24/2022   Chronic use of opiate for therapeutic purpose 08/24/2022   Abnormal MRI, thoracic spine (07/05/2020) 06/09/2022   Coronary artery disease involving native coronary artery of native heart without angina pectoris 05/06/2022   Chronic low back pain (Bilateral) w/ sciatica (Left) 04/02/2022   Advanced care planning/counseling discussion 02/20/2022   Hypotension due to drugs 02/04/2022   Elevated lipase 02/04/2022   Iron deficiency anemia 02/04/2022   Hyperkalemia 01/21/2022   S/P repair of paraesophageal hernia 01/20/2022   Umbilical hernia without obstruction and without gangrene 01/20/2022   Hiatal hernia    Adenomatous polyp of colon    Abnormal MRI, lumbar spine (03/11/2020) 10/29/2021   DDD (degenerative disc disease), lumbosacral 10/29/2021   Lumbosacral lateral recess stenosis (Left: L5-S1) 10/29/2021   Elevated C-reactive protein (CRP) 09/18/2021   Chronic pain syndrome 09/08/2021   Pharmacologic therapy 09/08/2021   Disorder of skeletal system 09/08/2021   Problems  influencing health status 09/08/2021   Lumbar facet syndrome 09/08/2021   Lumbosacral radiculopathy at S1 (Left) 09/08/2021   Chronic feet pain (2ry area of Pain) (Bilateral) 09/08/2021   Chronic ankle pain (Left) 09/08/2021   Abnormal NCS (nerve conduction studies) (02/20/2020) 09/08/2021   Neuropathy 06/19/2021   Gout 05/28/2021   PVD (peripheral vascular disease) (HCC) 05/28/2021   Hypertension associated with diabetes (HCC)    Chronic ankle pain (Bilateral) 07/25/2020   Chronic low back pain (1ry area of Pain)  (Bilateral) (R>L) w/o sciatica 07/25/2020   Diabetic peripheral neuropathy (HCC) 12/30/2019   Other acquired hammer toe 11/28/2019   Ankle fracture 03/23/2017   Type 2 diabetes mellitus with diabetic neuropathy, with long-term current use of insulin (HCC) 04/16/2016   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   Calculus of kidney 02/04/2015   Chronic obstructive pulmonary disease (HCC) 01/17/2013   Acid reflux 01/17/2013   Hyperlipidemia associated with type 2 diabetes mellitus (HCC) 01/17/2013   Apnea, sleep 01/17/2013   Testicular hypofunction 01/17/2013   Adiposity 07/06/2011    Past Surgical History:  Procedure Laterality Date   APPENDECTOMY     BRONCHOSCOPY  2016   CIRCUMCISION     COLONOSCOPY WITH PROPOFOL N/A 12/31/2021   Procedure: COLONOSCOPY WITH PROPOFOL;  Surgeon: Wyline Mood, MD;  Location: [facility] ENDOSCOPY;  Service: Gastroenterology;  Laterality: N/A;   ESOPHAGOGASTRODUODENOSCOPY N/A 12/31/2021   Procedure: ESOPHAGOGASTRODUODENOSCOPY (EGD);  Surgeon: Wyline Mood, MD;  Location: [facility] ENDOSCOPY;  Service: Gastroenterology;  Laterality: N/A;   INCISION AND DRAINAGE PERIRECTAL ABSCESS N/A 02/04/2015   Procedure: IRRIGATION AND DEBRIDEMENT PERIRECTAL ABSCESS;  Surgeon: Ida Rogue, MD;  Location: [facility] ORS;  Service: General;  Laterality: N/A;   INSERTION OF MESH  01/20/2022   Procedure: INSERTION OF MESH;  Surgeon: Leafy Ro, MD;  Location: [facility] ORS;  Service: General;;   INSERTION OF MESH N/A 04/08/2023   Procedure: INSERTION OF MESH;  Surgeon: Leafy Ro, MD;  Location: [facility] ORS;  Service: General;  Laterality: N/A;   KIDNEY STONE SURGERY Right    lung mass removal N/A    MUSCLE BIOPSY     ORIF ANKLE FRACTURE Left 03/23/2017   Procedure: OPEN REDUCTION INTERNAL FIXATION (ORIF) ANKLE FRACTURE;  Surgeon: Signa Kell, MD;  Location: [facility] ORS;  Service: Orthopedics;  Laterality: Left;   RECTAL EXAM UNDER ANESTHESIA  02/04/2015   Procedure: RECTAL EXAM UNDER ANESTHESIA;   Surgeon: Ida Rogue, MD;  Location: [facility] ORS;  Service: General;;   SYNDESMOSIS REPAIR Left 03/23/2017   Procedure: SYNDESMOSIS REPAIR;  Surgeon: Signa Kell, MD;  Location: [facility] ORS;  Service: Orthopedics;  Laterality: Left;   UMBILICAL HERNIA REPAIR  01/20/2022   Procedure: HERNIA REPAIR UMBILICAL ADULT;  Surgeon: Leafy Ro, MD;  Location: [facility] ORS;  Service: General;;   VASECTOMY     VENTRAL HERNIA REPAIR N/A 04/08/2023   Procedure: HERNIA REPAIR VENTRAL ADULT, open;  Surgeon: Leafy Ro, MD;  Location: [facility] ORS;  Service: General;  Laterality: N/A;   XI ROBOTIC ASSISTED PARAESOPHAGEAL HERNIA REPAIR N/A 01/20/2022   Procedure: XI ROBOTIC ASSISTED PARAESOPHAGEAL HERNIA REPAIR, CONVERTED TO OPEN, RNFA to assist;  Surgeon: Leafy Ro, MD;  Location: [facility] ORS;  Service: General;  Laterality: N/A;       Home Medications    Prior to Admission medications   Medication Sig Start Date End Date Taking? Authorizing Provider  amitriptyline (ELAVIL) 10 MG tablet Take 30 mg by mouth at bedtime.   Yes [provider]  ascorbic acid (VITAMIN  C) 500 MG tablet Take 500 mg by mouth daily.   Yes [provider]  Budeson-Glycopyrrol-Formoterol (BREZTRI AEROSPHERE) 160-9-4.8 MCG/ACT AERO Inhale 2 puffs into the lungs in the morning and at bedtime.   Yes [provider]  clotrimazole-betamethasone (LOTRISONE) cream Apply 1 Application topically daily. 03/29/23  Yes Larae Grooms, NP  cyanocobalamin (VITAMIN B12) 1000 MCG tablet Take 1,000 mcg by mouth daily.   Yes [provider]  dapagliflozin propanediol (FARXIGA) 10 MG TABS tablet Take 1 tablet (10 mg total) by mouth daily before breakfast. 02/25/23  Yes Larae Grooms, NP  ferrous sulfate 325 (65 FE) MG tablet Take 325 mg by mouth daily with breakfast.   Yes [provider]  fluticasone (FLONASE) 50 MCG/ACT nasal spray Place 1 spray into both nostrils daily as needed for allergies.  07/02/21  Yes [provider]  gabapentin (NEURONTIN) 600 MG tablet Take 600 mg by mouth 3 (three) times daily. 03/26/21  Yes [provider]  hydrochlorothiazide (HYDRODIURIL) 25 MG tablet Take 1 tablet (25 mg total) by mouth daily. 02/10/23  Yes Larae Grooms, NP  HYDROcodone-acetaminophen (NORCO/VICODIN) 5-325 MG tablet Take 1 tablet by mouth 2 (two) times daily as needed. Must last 30 days. 04/22/23 05/22/23 Yes Delano Metz, MD  insulin isophane & regular human (HUMULIN 70/30 KWIKPEN) (70-30) 100 UNIT/ML KwikPen Inject 42-82 Units into the skin 2 (two) times daily with a meal. 82 in the a.m. and 42u qhs 10/14/19  Yes [provider]  ipratropium (ATROVENT) 0.06 % nasal spray Place 2 sprays into both nostrils 4 (four) times daily. 05/12/23  Yes Amiyrah Lamere, DO  ipratropium-albuterol (DUONEB) 0.5-2.5 (3) MG/3ML SOLN Take 3 mLs by nebulization every 4 (four) hours as needed (SOB/Wheezing).   Yes [provider]  lisinopril (ZESTRIL) 10 MG tablet Take 1 tablet (10 mg total) by mouth daily. 02/25/23  Yes Larae Grooms, NP  Magnesium Oxide -Mg Supplement 500 MG TABS Take 500 mg by mouth daily. 06/27/22  Yes [provider]  metFORMIN (GLUCOPHAGE-XR) 500 MG 24 hr tablet TAKE 4 TABLETS BY MOUTH DAILY Patient taking differently: Take 1,000 mg by mouth 2 (two) times daily with a meal. 02/24/23  Yes Larae Grooms, NP  metroNIDAZOLE (METROGEL) 1 % gel Apply topically daily. 04/29/23  Yes Larae Grooms, NP  montelukast (SINGULAIR) 10 MG tablet Take 10 mg by mouth daily. 07/02/21  Yes [provider]  naloxone (NARCAN) nasal spray 4 mg/0.1 mL Place 1 spray into the nose as needed for up to 365 doses (for opioid-induced respiratory depresssion). In case of emergency (overdose), spray once into each nostril. If no response within 3 minutes, repeat application and call 911. 02/17/23 02/17/24 Yes Delano Metz, MD  NEEDLE, DISP, 18 G (BD  SAFETYGLIDE NEEDLE) 18G X 1-1/2" MISC 1 mg by Does not apply route every 7 (seven) days. 09/21/22  Yes McGowan, Carollee Herter A, PA-C  NEEDLE, DISP, 21 G (BD SAFETYGLIDE NEEDLE) 21G X 1" MISC 1 mg by Does not apply route every 7 (seven) days. 09/21/22  Yes McGowan, Carollee Herter A, PA-C  pantoprazole (PROTONIX) 40 MG tablet Take 40 mg by mouth 2 (two) times daily. 03/22/23  Yes [provider]  PRESCRIPTION MEDICATION Pt has CPAP Machine   Yes [provider]  PROAIR HFA 108 (90 Base) MCG/ACT inhaler Inhale 2 puffs into the lungs every 6 (six) hours as needed for wheezing or shortness of breath. 12/28/20  Yes [provider]  rosuvastatin (CRESTOR) 40 MG tablet TAKE 1 TABLET(40  MG) BY MOUTH DAILY 09/21/22  Yes Larae Grooms, NP  Semaglutide, 2 MG/DOSE, 8 MG/3ML SOPN Inject 2 mg into the skin once a week. 08/19/22  Yes [provider]  tadalafil (CIALIS) 5 MG tablet Take 1 tablet (5 mg total) by mouth daily as needed for erectile dysfunction. 01/07/22  Yes McGowan, Carollee Herter A, PA-C  tazarotene (AVAGE) 0.1 % cream Apply 1 Application topically at bedtime.   Yes [provider]  testosterone cypionate (DEPOTESTOSTERONE CYPIONATE) 200 MG/ML injection Inject 1 cc every 10 days. Patient taking differently: Inject 100 mg into the muscle See admin instructions. Inject 1 cc every 10 days. 02/25/23  Yes McGowan, Carollee Herter A, PA-C  HYDROcodone-acetaminophen (NORCO/VICODIN) 5-325 MG tablet Take 1 tablet by mouth 2 (two) times daily as needed. Must last 30 days. 03/23/23 04/22/23  Delano Metz, MD    Family History Family History  Problem Relation Age of Onset   Cancer Mother 56       Lung   Cancer Father        Colon   Heart disease Father    Alcohol abuse Father    Cancer Brother 61       Esophageal   Diabetes Brother    Heart disease Brother     Social History Social History   Tobacco Use   Smoking status: Former    Current packs/day: 0.50    Average packs/day: 0.5  packs/day for 34.0 years (17.0 ttl pk-yrs)    Types: Cigarettes    Passive exposure: Past   Smokeless tobacco: Never   Tobacco comments:    patient using Nicoderm patches  Vaping Use   Vaping status: Some Days   Substances: Nicotine  Substance Use Topics   Alcohol use: Not Currently    Alcohol/week: 12.0 standard drinks of alcohol    Types: 12 Cans of beer per week    Comment: Quit February 2023   Drug use: No     Allergies   Glipizide, Cephalexin, Duloxetine, and Dupixent [dupilumab]   Review of Systems Review of Systems: negative unless otherwise stated in HPI.      Physical Exam Triage Vital Signs ED Triage Vitals  Encounter Vitals Group     BP      Systolic BP Percentile      Diastolic BP Percentile      Pulse      Resp      Temp      Temp src      SpO2      Weight      Height      Head Circumference      Peak Flow      Pain Score      Pain Loc      Pain Education      Exclude from Growth Chart    No data found.  Updated Vital Signs BP 129/74 (BP Location: Left Arm)   Pulse (!) 102   Temp 98.4 F (36.9 C) (Oral)   Resp 16   Ht 6\' 2"  (1.88 m)   Wt 113.9 kg   SpO2 95%   BMI 32.23 kg/m   Visual Acuity Right Eye Distance:   Left Eye Distance:   Bilateral Distance:    Right Eye Near:   Left Eye Near:    Bilateral Near:     Physical Exam GEN:     alert, non-toxic appearing male in no distress    HENT:  mucus membranes moist, oropharyngeal without lesions or  erythema, no tonsillar hypertrophy or exudates, clear nasal discharge, bilateral TM normal EYES:   no scleral injection or discharge RESP:  no increased work of breathing, clear to auscultation bilaterally CVS:   regular rate and rhythm Skin:   warm and dry    UC Treatments / Results  Labs (all labs ordered are listed, but only abnormal results are displayed) Labs Reviewed  RESP PANEL BY RT-PCR (RSV, FLU A&B, COVID)  [id number]    EKG   Radiology No results  found.  Procedures Procedures (including critical care time)  Medications Ordered in UC Medications - No data to display  Initial Impression / Assessment and Plan / UC Course  I have reviewed the triage vital signs and the nursing notes.  Pertinent labs & imaging results that were available during my care of the patient were reviewed by me and considered in my medical decision making (see chart for details).       Pt is a 58 y.o. male who presents for 3 days of respiratory symptoms. Chord is afebrile here without recent antipyretics. Satting well on room air. Overall pt is non-toxic appearing, well hydrated, without respiratory distress. Pulmonary exam is unremarkable.  COVID, RSV and influenza panel obtained and was negative. History most consistent with viral respiratory illness. Discussed symptomatic treatment.  Explained lack of efficacy of antibiotics in viral disease.  Typical duration of symptoms discussed. Declined steroids.   Return and ED precautions given and voiced understanding. Discussed MDM, treatment plan and plan for follow-up with patient who agrees with plan.     Final Clinical Impressions(s) / UC Diagnoses   Final diagnoses:  Viral URI with cough   Discharge Instructions   None    ED Prescriptions     Medication Sig Dispense Auth. Provider   ipratropium (ATROVENT) 0.06 % nasal spray Place 2 sprays into both nostrils 4 (four) times daily. 15 mL Katha Cabal, DO      PDMP not reviewed this encounter.   Katha Cabal, DO 05/12/23 1654

## 2023-05-13 ENCOUNTER — Ambulatory Visit: Payer: 59 | Admitting: Nurse Practitioner

## 2023-05-13 ENCOUNTER — Ambulatory Visit: Payer: 59

## 2023-05-18 ENCOUNTER — Ambulatory Visit
Admission: RE | Admit: 2023-05-18 | Discharge: 2023-05-18 | Disposition: A | Discharge status: Home / Self Care | Payer: 59 | Source: Ambulatory Visit | Attending: Emergency Medicine | Admitting: Emergency Medicine

## 2023-05-18 DIAGNOSIS — Z122 Encounter for screening for malignant neoplasm of respiratory organs: Secondary | ICD-10-CM | POA: Insufficient documentation

## 2023-05-18 DIAGNOSIS — J4489 Other specified chronic obstructive pulmonary disease: Secondary | ICD-10-CM | POA: Diagnosis present

## 2023-05-18 DIAGNOSIS — Z87891 Personal history of nicotine dependence: Secondary | ICD-10-CM | POA: Insufficient documentation

## 2023-05-18 NOTE — Progress Notes (Unsigned)
 PROVIDER NOTE: Information contained herein reflects review and annotations entered in association with encounter. Interpretation of such information and data should be left to medically-trained personnel. Information provided to patient can be located elsewhere in the medical record under "Patient Instructions". Document created using STT-dictation technology, any transcriptional errors that may result from process are unintentional.    Patient: Darren Allen  Service Category: E/M  Provider: Oswaldo Done, MD  DOB: 01-06-1966  DOS: 05/19/2023  Referring Provider: Larae Grooms, NP  MRN: [id number]  Specialty: Interventional Pain Management  PCP: Larae Grooms, NP  Type: Established Patient  Setting: Ambulatory outpatient    Location: Office  Delivery: Face-to-face     HPI  Mr. NYEEM STOKE, a 58 y.o. year old male, is here today because of his No primary diagnosis found.. Mr. Holtman primary complain today is No chief complaint on file.  Pertinent problems: [name] has Ankle fracture; Neuropathy; Diabetic peripheral neuropathy (HCC); Gout; Chronic ankle pain (Bilateral); Chronic low back pain (1ry area of Pain) (Bilateral) (R>L) w/o sciatica; Other acquired hammer toe; Chronic pain syndrome; Lumbar facet syndrome; Lumbosacral radiculopathy at S1 (Left); Chronic feet pain (2ry area of Pain) (Bilateral); Chronic ankle pain (Left); Abnormal NCS (nerve conduction studies) (02/20/2020); Abnormal MRI, lumbar spine (03/11/2020); DDD (degenerative disc disease), lumbosacral; Lumbosacral lateral recess stenosis (Left: L5-S1); Chronic low back pain (Bilateral) w/ sciatica (Left); Abnormal MRI, thoracic spine (07/05/2020); Chronic low back pain (Bilateral) w/ sciatica (Right); Chronic lower extremity pain (intermittent) (Right); Chronic lower extremity pain (intermittent) (Left); Lumbar radiculitis (Right); Lumbar radiculitis (Left); Lumbar facet joint pain; and Spondylosis without myelopathy or  radiculopathy, lumbosacral region on their pertinent problem list. Pain Assessment: Severity of   is reported as a  /10. Location:    / . Onset:  . Quality:  . Timing:  . Modifying factor(s):  Marland Kitchen Vitals:  vitals were not taken for this visit.  BMI: Estimated body mass index is 32.23 kg/m as calculated from the following:   Height as of 05/12/23: 6\' 2"  (1.88 m).   Weight as of 05/12/23: 251 lb (113.9 kg). Last encounter: 03/18/2023. Last procedure: 03/02/2023.  Reason for encounter: medication management. ***  Discussed the use of AI scribe software for clinical note transcription with the patient, who gave verbal consent to proceed.  History of Present Illness           Pharmacotherapy Assessment  Analgesic: Hydrocodone/APAP 5/325 tablet, 1 tab p.o. twice daily (#60) MME/day: 10 mg/day   Monitoring: [place] PMP: PDMP reviewed during this encounter.       Pharmacotherapy: No side-effects or adverse reactions reported. Compliance: No problems identified. Effectiveness: Clinically acceptable.  No notes on file  No results found for: "CBDTHCR" No results found for: "D8THCCBX" No results found for: "D9THCCBX"  UDS:  Summary  Date Value Ref Range Status  08/24/2022 Note  Final    Comment:    ==================================================================== [employer] [facility] 13 (MW) ==================================================================== Test                             Result       Flag       Units  Drug Present and Declared for Prescription Verification   Hydrocodone                    312          EXPECTED   ng/mg creat   Norhydrocodone  398          EXPECTED   ng/mg creat    Sources of hydrocodone include scheduled prescription medications.    Norhydrocodone is an expected metabolite of hydrocodone.  ==================================================================== Test                      Result    Flag   Units      Ref Range   Creatinine               161              mg/dL      >=16 ==================================================================== Declared Medications:  The flagging and interpretation on this report are based on the  following declared medications.  Unexpected results may arise from  inaccuracies in the declared medications.   **Note: The testing scope of this panel includes these medications:   Hydrocodone (Norco)   **Note: The testing scope of this panel does not include the  following reported medications:   Acetaminophen (Tylenol)  Acetaminophen (Norco)  Albuterol (Proair HFA)  Albuterol (Duoneb)  Amitriptyline (Elavil)  Epinephrine (EpiPen)  Fluticasone (Trelegy)  Fluticasone (Flonase)  Gabapentin (Neurontin)  Hydrochlorothiazide (Hydrodiuril)  Insulin (Humulin)  Ipratropium (Duoneb)  Iron  Lisinopril (Zestril)  Magnesium (Mag-Ox)  Meloxicam (Mobic)  Metformin (Glucophage)  Montelukast (Singulair)  Naloxone (Narcan)  Ondansetron (Zofran)  Prednisone (Deltasone)  Rosuvastatin (Crestor)  Semaglutide  Sulfamethoxazole (Bactrim)  Tadalafil (Cialis)  Tamsulosin (Flomax)  Testosterone  Triamcinolone (Kenalog)  Trimethoprim (Bactrim)  Umeclidinium (Trelegy)  Vilanterol (Trelegy)  Vitamin C ==================================================================== For clinical consultation, please call [phone]. ====================================================================       ROS  Constitutional: Denies any fever or chills Gastrointestinal: No reported hemesis, hematochezia, vomiting, or acute GI distress Musculoskeletal: Denies any acute onset joint swelling, redness, loss of ROM, or weakness Neurological: No reported episodes of acute onset apraxia, aphasia, dysarthria, agnosia, amnesia, paralysis, loss of coordination, or loss of consciousness  Medication Review  Budeson-Glycopyrrol-Formoterol, HYDROcodone-acetaminophen, Magnesium Oxide -Mg Supplement, NEEDLE (DISP)  18 G, NEEDLE (DISP) 21 G, PRESCRIPTION MEDICATION, Semaglutide (2 MG/DOSE), albuterol, amitriptyline, ascorbic acid, clotrimazole-betamethasone, cyanocobalamin, dapagliflozin propanediol, ferrous sulfate, fluticasone, gabapentin, hydrochlorothiazide, insulin isophane & regular human KwikPen, ipratropium, ipratropium-albuterol, lisinopril, metFORMIN, metroNIDAZOLE, montelukast, naloxone, pantoprazole, rosuvastatin, tadalafil, tazarotene, and testosterone cypionate  History Review  Allergy: Mr. Hamre is allergic to glipizide, cephalexin, duloxetine, and dupixent [dupilumab]. Drug: Mr. Letizia  reports no history of drug use. Alcohol:  reports that he does not currently use alcohol after a past usage of about 12.0 standard drinks of alcohol per week. Tobacco:  reports that he has quit smoking. His smoking use included cigarettes. He has a 17 pack-year smoking history. He has been exposed to tobacco smoke. He has never used smokeless tobacco. Social: Mr. Gail  reports that he has quit smoking. His smoking use included cigarettes. He has a 17 pack-year smoking history. He has been exposed to tobacco smoke. He has never used smokeless tobacco. He reports that he does not currently use alcohol after a past usage of about 12.0 standard drinks of alcohol per week. He reports that he does not use drugs. Medical:  has a past medical history of Allergy, Asthma, Chronic lower back pain, COPD (chronic obstructive pulmonary disease) (HCC), Coronary artery disease, DDD (degenerative disc disease), lumbosacral, Depression, Diabetic peripheral neuropathy (HCC), Diastolic dysfunction (12/27/2020), Diverticulosis, Erectile dysfunction, GERD (gastroesophageal reflux disease), Hepatic steatosis, History of kidney stones, Hyperlipidemia, Hypertension, Hypogonadism male, IDA (iron deficiency anemia), Long term  current use of opiate analgesic, OSA on CPAP, Paraesophageal hernia, PVD (peripheral vascular disease) (HCC), T2DM (type  2 diabetes mellitus) (HCC), Umbilical hernia, and Ventral hernia. Surgical: Mr. Enck  has a past surgical history that includes Appendectomy; Incision and drainage perirectal abscess (N/A, 02/04/2015); Rectal exam under anesthesia (02/04/2015); ORIF ankle fracture (Left, 03/23/2017); Syndesmosis repair (Left, 03/23/2017); Colonoscopy with propofol (N/A, 12/31/2021); Esophagogastroduodenoscopy (N/A, 12/31/2021); Kidney stone surgery (Right); lung mass removal (N/A); Xi robotic assisted paraesophageal hernia repair (N/A, 01/20/2022); Insertion of mesh (01/20/2022); Umbilical hernia repair (01/20/2022); Vasectomy; Circumcision; Bronchoscopy (2016); Muscle biopsy; Ventral hernia repair (N/A, 04/08/2023); and Insertion of mesh (N/A, 04/08/2023). Family: family history includes Alcohol abuse in his father; Cancer in his father; Cancer (age of onset: 20) in his brother; Cancer (age of onset: 80) in his mother; Diabetes in his brother; Heart disease in his brother and father.  Laboratory Chemistry Profile   Renal Lab Results  Component Value Date   BUN 13 02/25/2023   CREATININE 1.15 02/25/2023   BCR 11 02/25/2023   GFRAA 59 (L) 12/07/2019   GFRNONAA >60 02/15/2022    Hepatic Lab Results  Component Value Date   AST 34 02/25/2023   ALT 39 02/25/2023   ALBUMIN 4.2 02/25/2023   ALKPHOS 59 02/25/2023   AMYLASE 93 02/04/2022   LIPASE 115 (H) 02/04/2022    Electrolytes Lab Results  Component Value Date   NA 137 02/25/2023   K 3.5 02/25/2023   CL 96 02/25/2023   CALCIUM 9.8 02/25/2023   MG 1.8 01/25/2022   PHOS 2.3 (L) 01/23/2022    Bone Lab Results  Component Value Date   25OHVITD1 44 09/08/2021   25OHVITD2 <1.0 09/08/2021   25OHVITD3 43 09/08/2021   TESTOSTERONE 575 02/23/2023    Inflammation (CRP: Acute Phase) (ESR: Chronic Phase) Lab Results  Component Value Date   CRP 1.8 (H) 09/08/2021   ESRSEDRATE 17 09/08/2021   LATICACIDVEN 1.6 02/03/2022         Note: Above Lab results  reviewed.  Recent Imaging Review  CT ABDOMEN PELVIS W CONTRAST CLINICAL DATA:  Evaluate for incisional hernia.  EXAM: CT ABDOMEN AND PELVIS WITH CONTRAST  TECHNIQUE: Multidetector CT imaging of the abdomen and pelvis was performed using the standard protocol following bolus administration of intravenous contrast.  RADIATION DOSE REDUCTION: This exam was performed according to the departmental dose-optimization program which includes automated exposure control, adjustment of the mA and/or kV according to patient size and/or use of iterative reconstruction technique.  CONTRAST:  OMNIPAQUE IOHEXOL 300 MG/ML  SOLN  COMPARISON:  CT February 03, 2022  FINDINGS: Lower chest: No acute abnormality. Postsurgical change about the gastroesophageal junction with largely resolved fluid collection now measuring 12 x 10 mm on image 22/2 previously 16.3 x 3.1 cm.  Hepatobiliary: Diffuse hepatic steatosis. No suspicious hepatic lesion. Gallbladder is unremarkable. No biliary ductal dilation.  Pancreas: No pancreatic ductal dilation or evidence of acute inflammation.  Spleen: No splenomegaly.  Adrenals/Urinary Tract: Bilateral adrenal glands appear normal. No hydronephrosis. Kidneys demonstrate symmetric enhancement. Urinary bladder is unremarkable for degree of distension.  Stomach/Bowel: No radiopaque enteric contrast material was administered. No pathologic dilation of small or large bowel. Colonic diverticulosis without findings of acute diverticulitis.  Vascular/Lymphatic: Aortic atherosclerosis. Smooth IVC contours. The portal, splenic and superior mesenteric veins are patent. No pathologically enlarged abdominal or pelvic lymph nodes.  Reproductive: Prostate is unremarkable.  Other: Small ventral hernia contains fat with a 13 mm aperture width. Subcutaneous nodular stranding in  the anterior abdominal wall may reflects sequela of subcutaneous  injections  Musculoskeletal: No acute osseous abnormality.  IMPRESSION: 1. Small ventral hernia contains fat with a 13 mm aperture width. 2. Postsurgical change about the gastroesophageal junction with largely resolved fluid collection now measuring 12 x 10 mm previously 16.3 x 3.1 cm. 3. Diffuse hepatic steatosis. 4. Colonic diverticulosis without findings of acute diverticulitis. 5.  Aortic Atherosclerosis (ICD10-I70.0).  Electronically Signed   By: Maudry Mayhew M.D.   On: 03/07/2023 07:49 Note: Reviewed        Physical Exam  General appearance: Well nourished, well developed, and well hydrated. In no apparent acute distress Mental status: Alert, oriented x 3 (person, place, & time)       Respiratory: No evidence of acute respiratory distress Eyes: PERLA Vitals: There were no vitals taken for this visit. BMI: Estimated body mass index is 32.23 kg/m as calculated from the following:   Height as of 05/12/23: 6\' 2"  (1.88 m).   Weight as of 05/12/23: 251 lb (113.9 kg). Ideal: Ideal body weight: 82.2 kg (181 lb 3.5 oz) Adjusted ideal body weight: 94.9 kg (209 lb 2.1 oz)  Assessment   Diagnosis Status  No diagnosis found. Controlled Controlled Controlled   Updated Problems: No problems updated.  Plan of Care  Problem-specific:  Assessment and Plan            Mr. AGUSTINE ROSSITTO has a current medication list which includes the following long-term medication(s): ferrous sulfate, fluticasone, hydrochlorothiazide, hydrocodone-acetaminophen, hydrocodone-acetaminophen, humulin 70/30 kwikpen, ipratropium, lisinopril, metformin, montelukast, rosuvastatin, tadalafil, and testosterone cypionate.  Pharmacotherapy (Medications Ordered): No orders of the defined types were placed in this encounter.  Orders:  No orders of the defined types were placed in this encounter.  Follow-up plan:   No follow-ups on file.      Interventional Therapies  Risk Factors  Considerations:    WNL   Planned  Pending:      Under consideration:   Diagnostic bilateral lumbar facet MBB #2 (without steroids) (PRN) Therapeutic right L5-S1 LESI #2  Possible spinal cord stimulator trial  Therapeutic left L5-S1 percutaneous discectomy with "[employer] Dekompressor" system    Completed:   Diagnostic bilateral lumbar facet MBB x1 (03/02/2023) (100/100/95/100)  Diagnostic midline to left caudal ESI x1 (04/02/2022) (100/100/100/LBP:100/LEP:100)  Therapeutic left L5-S1 LESI x3 (08/27/2022) (1st:100/100/100/LBP:85  LEP:100) (2nd: 01[birth year]13/59/LBP:60LLEP:100)  Therapeutic right L5-S1 LESI x1 (12/24/2022) (100/85/100/LBP:100)  Therapeutic bilateral Qutenza neurolytic treatment x1 (12/23/2021)  (09/08/2021 & 10/29/2021) referral to physical therapy for evaluation and treatment of low back pain.   Completed by other providers:   EMG/PNCV of lower extremity (02/20/2020) by Dr. Cristopher Peru (generalized sensorimotor peripheral neuropathy; superimposed left S1 radiculopathy)   Therapeutic  Palliative (PRN) options:   Diagnostic bilateral lumbar facet MBB #2 (without steroids) (PRN)      Recent Visits Date Type Provider Dept  03/18/23 Office Visit Delano Metz, MD [facility]  03/02/23 Procedure visit Delano Metz, MD [facility]  02/17/23 Office Visit Delano Metz, MD [facility]  Showing recent visits within past 90 days and meeting all other requirements Future Appointments Date Type Provider Dept  05/19/23 Appointment Delano Metz, MD [facility]  Showing future appointments within next 90 days and meeting all other requirements  I discussed the assessment and treatment plan with the patient. The patient was provided an opportunity to ask questions and all were answered. The patient agreed with the plan and demonstrated an understanding of the  instructions.  Patient advised to call back or seek an in-person evaluation if the  symptoms or condition worsens.  Duration of encounter: *** minutes.  Total time on encounter, as per AMA guidelines included both the face-to-face and non-face-to-face time personally spent by the physician and/or other qualified health care professional(s) on the day of the encounter (includes time in activities that require the physician or other qualified health care professional and does not include time in activities normally performed by clinical staff). Physician's time may include the following activities when performed: Preparing to see the patient (e.g., pre-charting review of records, searching for previously ordered imaging, lab work, and nerve conduction tests) Review of prior analgesic pharmacotherapies. Reviewing PMP Interpreting ordered tests (e.g., lab work, imaging, nerve conduction tests) Performing post-procedure evaluations, including interpretation of diagnostic procedures Obtaining and/or reviewing separately obtained history Performing a medically appropriate examination and/or evaluation Counseling and educating the patient/family/caregiver Ordering medications, tests, or procedures Referring and communicating with other health care professionals (when not separately reported) Documenting clinical information in the electronic or other health record Independently interpreting results (not separately reported) and communicating results to the patient/ family/caregiver Care coordination (not separately reported)  Note by: Oswaldo Done, MD Date: 05/19/2023; Time: 8:53 AM

## 2023-05-18 NOTE — Patient Instructions (Incomplete)

## 2023-05-19 ENCOUNTER — Ambulatory Visit: Payer: 59 | Attending: Pain Medicine | Admitting: Pain Medicine

## 2023-05-19 ENCOUNTER — Encounter: Payer: Self-pay | Admitting: Pain Medicine

## 2023-05-19 ENCOUNTER — Ambulatory Visit: Payer: 59 | Admitting: Nurse Practitioner

## 2023-05-19 VITALS — BP 141/80 | HR 89 | Temp 98.9°F | Ht 74.0 in | Wt 248.0 lb

## 2023-05-19 DIAGNOSIS — G8929 Other chronic pain: Secondary | ICD-10-CM | POA: Insufficient documentation

## 2023-05-19 DIAGNOSIS — M79671 Pain in right foot: Secondary | ICD-10-CM | POA: Diagnosis not present

## 2023-05-19 DIAGNOSIS — M5137 Other intervertebral disc degeneration, lumbosacral region with discogenic back pain only: Secondary | ICD-10-CM | POA: Insufficient documentation

## 2023-05-19 DIAGNOSIS — M5459 Other low back pain: Secondary | ICD-10-CM | POA: Diagnosis present

## 2023-05-19 DIAGNOSIS — M79672 Pain in left foot: Secondary | ICD-10-CM | POA: Diagnosis not present

## 2023-05-19 DIAGNOSIS — M47817 Spondylosis without myelopathy or radiculopathy, lumbosacral region: Secondary | ICD-10-CM | POA: Insufficient documentation

## 2023-05-19 DIAGNOSIS — Z79899 Other long term (current) drug therapy: Secondary | ICD-10-CM | POA: Diagnosis present

## 2023-05-19 DIAGNOSIS — M47816 Spondylosis without myelopathy or radiculopathy, lumbar region: Secondary | ICD-10-CM | POA: Diagnosis present

## 2023-05-19 DIAGNOSIS — M545 Low back pain, unspecified: Secondary | ICD-10-CM | POA: Diagnosis present

## 2023-05-19 DIAGNOSIS — Z79891 Long term (current) use of opiate analgesic: Secondary | ICD-10-CM | POA: Insufficient documentation

## 2023-05-19 DIAGNOSIS — G894 Chronic pain syndrome: Secondary | ICD-10-CM | POA: Insufficient documentation

## 2023-05-19 MED ORDER — HYDROCODONE-ACETAMINOPHEN 5-325 MG PO TABS: 1.0000 | ORAL_TABLET | Freq: Two times a day (BID) | ORAL | 0 refills | Status: DC | PRN

## 2023-05-19 MED ORDER — HYDROCODONE-ACETAMINOPHEN 5-325 MG PO TABS
1.0000 | ORAL_TABLET | Freq: Two times a day (BID) | ORAL | 0 refills | Status: DC | PRN
Start: 2023-06-21 — End: 2023-08-10

## 2023-05-19 NOTE — Progress Notes (Signed)
 Nursing Pain Medication Assessment:  Safety precautions to be maintained throughout the outpatient stay will include: orient to surroundings, keep bed in low position, maintain call bell within reach at all times, provide assistance with transfer out of bed and ambulation.  Medication Inspection Compliance: Pill count conducted under aseptic conditions, in front of the patient. Neither the pills nor the bottle was removed from the patient's sight at any time. Once count was completed pills were immediately returned to the patient in their original bottle.  Medication: Hydrocodone/APAP Pill/Patch Count:  14 of 60 pills remain Pill/Patch Appearance: Markings consistent with prescribed medication Bottle Appearance: Standard pharmacy container. Clearly labeled. Filled Date: 2 / 7 / 2025 Last Medication intake:  TodaySafety precautions to be maintained throughout the outpatient stay will include: orient to surroundings, keep bed in low position, maintain call bell within reach at all times, provide assistance with transfer out of bed and ambulation.

## 2023-05-25 ENCOUNTER — Encounter: Payer: Self-pay | Admitting: Pain Medicine

## 2023-05-25 ENCOUNTER — Ambulatory Visit
Admission: RE | Admit: 2023-05-25 | Discharge: 2023-05-25 | Disposition: A | Discharge status: Home / Self Care | Source: Ambulatory Visit | Attending: Pain Medicine | Admitting: Pain Medicine

## 2023-05-25 ENCOUNTER — Ambulatory Visit: Attending: Pain Medicine | Admitting: Pain Medicine

## 2023-05-25 VITALS — BP 147/90 | HR 90 | Temp 98.2°F | Resp 98 | Ht 74.0 in | Wt 248.0 lb

## 2023-05-25 DIAGNOSIS — M5137 Other intervertebral disc degeneration, lumbosacral region with discogenic back pain only: Secondary | ICD-10-CM | POA: Insufficient documentation

## 2023-05-25 DIAGNOSIS — G8929 Other chronic pain: Secondary | ICD-10-CM | POA: Diagnosis present

## 2023-05-25 DIAGNOSIS — M47817 Spondylosis without myelopathy or radiculopathy, lumbosacral region: Secondary | ICD-10-CM | POA: Diagnosis not present

## 2023-05-25 DIAGNOSIS — M47816 Spondylosis without myelopathy or radiculopathy, lumbar region: Secondary | ICD-10-CM | POA: Insufficient documentation

## 2023-05-25 DIAGNOSIS — M545 Low back pain, unspecified: Secondary | ICD-10-CM | POA: Insufficient documentation

## 2023-05-25 DIAGNOSIS — Z794 Long term (current) use of insulin: Secondary | ICD-10-CM | POA: Diagnosis present

## 2023-05-25 DIAGNOSIS — R937 Abnormal findings on diagnostic imaging of other parts of musculoskeletal system: Secondary | ICD-10-CM | POA: Insufficient documentation

## 2023-05-25 DIAGNOSIS — E1140 Type 2 diabetes mellitus with diabetic neuropathy, unspecified: Secondary | ICD-10-CM | POA: Diagnosis present

## 2023-05-25 DIAGNOSIS — M5459 Other low back pain: Secondary | ICD-10-CM | POA: Insufficient documentation

## 2023-05-25 MED ORDER — ROPIVACAINE HCL 2 MG/ML IJ SOLN
18.0000 mL | Freq: Once | INTRAMUSCULAR | Status: AC
  Administered 2023-05-25: 18 mL via PERINEURAL

## 2023-05-25 MED ORDER — LIDOCAINE HCL 2 % IJ SOLN
20.0000 mL | Freq: Once | INTRAMUSCULAR | Status: AC
  Administered 2023-05-25: 400 mg

## 2023-05-25 MED ORDER — LIDOCAINE HCL 2 % IJ SOLN
INTRAMUSCULAR | Status: AC
  Filled 2023-05-25: qty 20

## 2023-05-25 MED ORDER — ROPIVACAINE HCL 2 MG/ML IJ SOLN
INTRAMUSCULAR | Status: AC
  Filled 2023-05-25: qty 20

## 2023-05-25 MED ORDER — PENTAFLUOROPROP-TETRAFLUOROETH EX AERO
INHALATION_SPRAY | Freq: Once | CUTANEOUS | Status: AC
  Administered 2023-05-25: 30 via TOPICAL

## 2023-05-25 MED ORDER — MIDAZOLAM HCL 2 MG/2ML IJ SOLN: 0.5000 mg | Freq: Once | INTRAMUSCULAR | Status: DC

## 2023-05-25 NOTE — Progress Notes (Signed)
 PROVIDER NOTE: Interpretation of information contained herein should be left to medically-trained personnel. Specific patient instructions are provided elsewhere under "Patient Instructions" section of medical record. This document was created in part using STT-dictation technology, any transcriptional errors that may result from this process are unintentional.  Patient: Darren Allen Type: Established DOB: 26-Oct-1965 MRN: [id number] PCP: Larae Grooms, NP  Service: Procedure DOS: 05/25/2023 Setting: Ambulatory Location: Ambulatory outpatient facility Delivery: Face-to-face Provider: Oswaldo Done, MD Specialty: Interventional Pain Management Specialty designation: 09 Location: Outpatient facility Ref. Prov.: Larae Grooms, NP       Interventional Therapy   Type: Lumbar Facet, Medial Branch Block(s) (w/ fluoroscopic mapping) #2 (w/o steroids) Laterality: Bilateral  Level: L2, L3, L4, L5, and S1 Medial Branch Level(s). Injecting these levels blocks the L3-4, L4-5, and L5-S1 lumbar facet joints. Imaging: Fluoroscopic guidance Spinal ([id number]) Anesthesia: Local anesthesia (1-2% Lidocaine) Anxiolysis: None                 Sedation: No Sedation                       DOS: 05/25/2023 Performed by: Oswaldo Done, MD  Primary Purpose: Diagnostic/Therapeutic Indications: Low back pain severe enough to impact quality of life or function. 1. Chronic low back pain (1ry area of Pain) (Bilateral) (R>L) w/o sciatica   2. DDD (degenerative disc disease), lumbosacral w/ LBP   3. Lumbar facet joint pain   4. Lumbar facet syndrome   5. Spondylosis without myelopathy or radiculopathy, lumbosacral region   6. Abnormal MRI, lumbar spine (03/11/2020)    NAS-11 Pain score:   Pre-procedure: 3 /10   Post-procedure: 3 /10     Position / Prep / Materials:  Position: Prone  Prep solution: ChloraPrep (2% chlorhexidine gluconate and 70% isopropyl alcohol) Area Prepped: Posterolateral  Lumbosacral Spine (Wide prep: From the lower border of the scapula down to the end of the tailbone and from flank to flank.)  Materials:  Tray: Block Needle(s):  Type: Spinal  Gauge (G): 22  Length: 3.5-in Qty: 4      H&P (Pre-op Assessment):  Darren Allen is a 58 y.o. (year old), male patient, seen today for interventional treatment. He  has a past surgical history that includes Appendectomy; Incision and drainage perirectal abscess (N/A, 02/04/2015); Rectal exam under anesthesia (02/04/2015); ORIF ankle fracture (Left, 03/23/2017); Syndesmosis repair (Left, 03/23/2017); Colonoscopy with propofol (N/A, 12/31/2021); Esophagogastroduodenoscopy (N/A, 12/31/2021); Kidney stone surgery (Right); lung mass removal (N/A); Xi robotic assisted paraesophageal hernia repair (N/A, 01/20/2022); Insertion of mesh (01/20/2022); Umbilical hernia repair (01/20/2022); Vasectomy; Circumcision; Bronchoscopy (2016); Muscle biopsy; Ventral hernia repair (N/A, 04/08/2023); and Insertion of mesh (N/A, 04/08/2023). Darren Allen has a current medication list which includes the following prescription(s): amitriptyline, ascorbic acid, breztri aerosphere, clotrimazole-betamethasone, cyanocobalamin, dapagliflozin propanediol, ferrous sulfate, fluticasone, gabapentin, hydrochlorothiazide, hydrocodone-acetaminophen, [START ON 06/21/2023] hydrocodone-acetaminophen, [START ON 07/21/2023] hydrocodone-acetaminophen, humulin 70/30 kwikpen, ipratropium, ipratropium-albuterol, lisinopril, magnesium oxide -mg supplement, metformin, metronidazole, montelukast, naloxone, bd safetyglide needle, bd safetyglide needle, pantoprazole, PRESCRIPTION MEDICATION, proair hfa, rosuvastatin, tadalafil, tazarotene, testosterone cypionate, and mounjaro. His primarily concern today is the Back Pain  Initial Vital Signs:  Pulse/HCG Rate: 100  Temp: 98.2 F (36.8 C) Resp: 16 BP: 133/85 SpO2: 100 %  BMI: Estimated body mass index is 31.84 kg/m as calculated from  the following:   Height as of this encounter: 6\' 2"  (1.88 m).   Weight as of this encounter: 248 lb (112.5 kg).  Risk Assessment: Allergies: Reviewed. He  is allergic to glipizide, cephalexin, duloxetine, and dupixent [dupilumab].  Allergy Precautions: None required Coagulopathies: Reviewed. None identified.  Blood-thinner therapy: None at this time Active Infection(s): Reviewed. None identified. Darren Allen is afebrile  Site Confirmation: Darren Allen was asked to confirm the procedure and laterality before marking the site Procedure checklist: Completed Consent: Before the procedure and under the influence of no sedative(s), amnesic(s), or anxiolytics, the patient was informed of the treatment options, risks and possible complications. To fulfill our ethical and legal obligations, as recommended by the American Medical Association's Code of Ethics, I have informed the patient of my clinical impression; the nature and purpose of the treatment or procedure; the risks, benefits, and possible complications of the intervention; the alternatives, including doing nothing; the risk(s) and benefit(s) of the alternative treatment(s) or procedure(s); and the risk(s) and benefit(s) of doing nothing. The patient was provided information about the general risks and possible complications associated with the procedure. These may include, but are not limited to: failure to achieve desired goals, infection, bleeding, organ or nerve damage, allergic reactions, paralysis, and death. In addition, the patient was informed of those risks and complications associated to Spine-related procedures, such as failure to decrease pain; infection (i.e.: Meningitis, epidural or intraspinal abscess); bleeding (i.e.: epidural hematoma, subarachnoid hemorrhage, or any other type of intraspinal or peri-dural bleeding); organ or nerve damage (i.e.: Any type of peripheral nerve, nerve root, or spinal cord injury) with subsequent damage to  sensory, motor, and/or autonomic systems, resulting in permanent pain, numbness, and/or weakness of one or several areas of the body; allergic reactions; (i.e.: anaphylactic reaction); and/or death. Furthermore, the patient was informed of those risks and complications associated with the medications. These include, but are not limited to: allergic reactions (i.e.: anaphylactic or anaphylactoid reaction(s)); adrenal axis suppression; blood sugar elevation that in diabetics may result in ketoacidosis or comma; water retention that in patients with history of congestive heart failure may result in shortness of breath, pulmonary edema, and decompensation with resultant heart failure; weight gain; swelling or edema; medication-induced neural toxicity; particulate matter embolism and blood vessel occlusion with resultant organ, and/or nervous system infarction; and/or aseptic necrosis of one or more joints. Finally, the patient was informed that Medicine is not an exact science; therefore, there is also the possibility of unforeseen or unpredictable risks and/or possible complications that may result in a catastrophic outcome. The patient indicated having understood very clearly. We have given the patient no guarantees and we have made no promises. Enough time was given to the patient to ask questions, all of which were answered to the patient's satisfaction. Mr. Bahe has indicated that he wanted to continue with the procedure. Attestation: I, the ordering provider, attest that I have discussed with the patient the benefits, risks, side-effects, alternatives, likelihood of achieving goals, and potential problems during recovery for the procedure that I have provided informed consent. Date  Time: 05/25/2023  8:21 AM  Pre-Procedure Preparation:  Monitoring: As per clinic protocol. Respiration, ETCO2, SpO2, BP, heart rate and rhythm monitor placed and checked for adequate function Safety Precautions: Patient was  assessed for positional comfort and pressure points before starting the procedure. Time-out: I initiated and conducted the "Time-out" before starting the procedure, as per protocol. The patient was asked to participate by confirming the accuracy of the "Time Out" information. Verification of the correct person, site, and procedure were performed and confirmed by me, the nursing staff, and the patient. "Time-out" conducted as per Joint Commission's Universal Protocol (  UP.01.01.01). Time: 0836 Start Time: 0836 hrs.  Description of Procedure:          Laterality: (see above) Targeted Levels: (see above)  Safety Precautions: Aspiration looking for blood return was conducted prior to all injections. At no point did we inject any substances, as a needle was being advanced. Before injecting, the patient was told to immediately notify me if he was experiencing any new onset of "ringing in the ears, or metallic taste in the mouth". No attempts were made at seeking any paresthesias. Safe injection practices and needle disposal techniques used. Medications properly checked for expiration dates. SDV (single dose vial) medications used. After the completion of the procedure, all disposable equipment used was discarded in the proper designated medical waste containers. Local Anesthesia: Protocol guidelines were followed. The patient was positioned over the fluoroscopy table. The area was prepped in the usual manner. The time-out was completed. The target area was identified using fluoroscopy. A 12-in long, straight, sterile hemostat was used with fluoroscopic guidance to locate the targets for each level blocked. Once located, the skin was marked with an approved surgical skin marker. Once all sites were marked, the skin (epidermis, dermis, and hypodermis), as well as deeper tissues (fat, connective tissue and muscle) were infiltrated with a small amount of a short-acting local anesthetic, loaded on a 10cc syringe with  a 25G, 1.5-in  Needle. An appropriate amount of time was allowed for local anesthetics to take effect before proceeding to the next step. Local Anesthetic: Lidocaine 2.0% The unused portion of the local anesthetic was discarded in the proper designated containers. Technical description of process:  L2 Medial Branch Nerve Block (MBB): The target area for the L2 medial branch is at the junction of the postero-lateral aspect of the superior articular process and the superior, posterior, and medial edge of the transverse process of L3. Under fluoroscopic guidance, a Quincke needle was inserted until contact was made with os over the superior postero-lateral aspect of the pedicular shadow (target area). After negative aspiration for blood, 0.5 mL of the nerve block solution was injected without difficulty or complication. The needle was removed intact. L3 Medial Branch Nerve Block (MBB): The target area for the L3 medial branch is at the junction of the postero-lateral aspect of the superior articular process and the superior, posterior, and medial edge of the transverse process of L4. Under fluoroscopic guidance, a Quincke needle was inserted until contact was made with os over the superior postero-lateral aspect of the pedicular shadow (target area). After negative aspiration for blood, 0.5 mL of the nerve block solution was injected without difficulty or complication. The needle was removed intact. L4 Medial Branch Nerve Block (MBB): The target area for the L4 medial branch is at the junction of the postero-lateral aspect of the superior articular process and the superior, posterior, and medial edge of the transverse process of L5. Under fluoroscopic guidance, a Quincke needle was inserted until contact was made with os over the superior postero-lateral aspect of the pedicular shadow (target area). After negative aspiration for blood, 0.5 mL of the nerve block solution was injected without difficulty or  complication. The needle was removed intact. L5 Medial Branch Nerve Block (MBB): The target area for the L5 medial branch is at the junction of the postero-lateral aspect of the superior articular process and the superior, posterior, and medial edge of the sacral ala. Under fluoroscopic guidance, a Quincke needle was inserted until contact was made with os over the superior  postero-lateral aspect of the pedicular shadow (target area). After negative aspiration for blood, 0.5 mL of the nerve block solution was injected without difficulty or complication. The needle was removed intact. S1 Medial Branch Nerve Block (MBB): The target area for the S1 medial branch is at the posterior and inferior 6 o'clock position of the L5-S1 facet joint. Under fluoroscopic guidance, the Quincke needle inserted for the L5 MBB was redirected until contact was made with os over the inferior and postero aspect of the sacrum, at the 6 o' clock position under the L5-S1 facet joint (Target area). After negative aspiration for blood, 0.5 mL of the nerve block solution was injected without difficulty or complication. The needle was removed intact.  Once the entire procedure was completed, the treated area was cleaned, making sure to leave some of the prepping solution back to take advantage of its long term bactericidal properties.         Illustration of the posterior view of the lumbar spine and the posterior neural structures. Laminae of L2 through S1 are labeled. DPRL5, dorsal primary ramus of L5; DPRS1, dorsal primary ramus of S1; DPR3, dorsal primary ramus of L3; FJ, facet (zygapophyseal) joint L3-L4; I, inferior articular process of L4; LB1, lateral branch of dorsal primary ramus of L1; IAB, inferior articular branches from L3 medial branch (supplies L4-L5 facet joint); IBP, intermediate branch plexus; MB3, medial branch of dorsal primary ramus of L3; NR3, third lumbar nerve root; S, superior articular process of L5; SAB,  superior articular branches from L4 (supplies L4-5 facet joint also); TP3, transverse process of L3.   Facet Joint Innervation (* possible contribution)  L1-2 T12, L1 (L2*)  Medial Branch  L2-3 L1, L2 (L3*)         "          "  L3-4 L2, L3 (L4*)         "          "  L4-5 L3, L4 (L5*)         "          "  L5-S1 L4, L5, S1          "          "    Vitals:   05/25/23 0820 05/25/23 0830 05/25/23 0840 05/25/23 0850  BP: 133/85 (!) 146/97 (!) 145/99 (!) 147/90  Pulse: 100 98 93 90  Resp: 16 16 (!) 99 (!) 98  Temp: 98.2 F (36.8 C)     TempSrc: Temporal     SpO2: 100% 100% 99% 100%  Weight: 248 lb (112.5 kg)     Height: 6\' 2"  (1.88 m)        End Time: 0846 hrs.  Imaging Guidance (Spinal):          Type of Imaging Technique: Fluoroscopy Guidance (Spinal) Indication(s): Fluoroscopy guidance for needle placement to enhance accuracy in procedures requiring precise needle localization for targeted delivery of medication in or near specific anatomical locations not easily accessible without such real-time imaging assistance. Exposure Time: Please see nurses notes. Contrast: None used. Fluoroscopic Guidance: I was personally present during the use of fluoroscopy. "Tunnel Vision Technique" used to obtain the best possible view of the target area. Parallax error corrected before commencing the procedure. "Direction-depth-direction" technique used to introduce the needle under continuous pulsed fluoroscopy. Once target was reached, antero-posterior, oblique, and lateral fluoroscopic projection used confirm needle placement in all planes. Images permanently stored in EMR. Interpretation:  No contrast injected. I personally interpreted the imaging intraoperatively. Adequate needle placement confirmed in multiple planes. Permanent images saved into the patient's record.  Post-operative Assessment:  Post-procedure Vital Signs:  Pulse/HCG Rate: 90  Temp: 98.2 F (36.8 C) Resp: (!) 98 BP: (!)  147/90 SpO2: 100 %  EBL: None  Complications: No immediate post-treatment complications observed by team, or reported by patient.  Note: The patient tolerated the entire procedure well. A repeat set of vitals were taken after the procedure and the patient was kept under observation following institutional policy, for this type of procedure. Post-procedural neurological assessment was performed, showing return to baseline, prior to discharge. The patient was provided with post-procedure discharge instructions, including a section on how to identify potential problems. Should any problems arise concerning this procedure, the patient was given instructions to immediately contact us, at any time, without hesitation. In any case, we plan to contact the patient by telephone for a follow-up status report regarding this interventional procedure.  Comments:  No additional relevant information.  Plan of Care (POC)  Orders:  Orders Placed This Encounter  Procedures   LUMBAR FACET(MEDIAL BRANCH NERVE BLOCK) MBNB    Scheduling Instructions:     Procedure: Lumbar facet block (AKA.: Lumbosacral medial branch nerve block)     Side: Bilateral     Level: L3-4, L4-5, L5-S1, and TBD Facets (L2, L3, L4, L5, S1, and TBD Medial Branch Nerves)     Sedation: Patient's choice.     Timeframe: Today    Where will this procedure be performed?:   [facility]   [facility] C-ARM 1-60 MIN NO REPORT    Intraoperative interpretation by procedural physician at [facility].    Standing Status:   Standing    Number of Occurrences:   1    Reason for exam::   Assistance in needle guidance and placement for procedures requiring needle placement in or near specific anatomical locations not easily accessible without such assistance.   Informed Consent Details: Physician/Practitioner Attestation; Transcribe to consent form and obtain patient signature    Nursing Order: Transcribe to consent form and obtain  patient signature. Note: Always confirm laterality of pain with Mr. Lamorte, before procedure.    Physician/Practitioner attestation of informed consent for procedure/surgical case:   I, the physician/practitioner, attest that I have discussed with the patient the benefits, risks, side effects, alternatives, likelihood of achieving goals and potential problems during recovery for the procedure that I have provided informed consent.    Procedure:   Lumbar Facet Block  under fluoroscopic guidance    Physician/Practitioner performing the procedure:   Travia Onstad A. Laban Emperor MD    Indication/Reason:   Low Back Pain, with our without leg pain, due to Facet Joint Arthralgia (Joint Pain) Spondylosis (Arthritis of the Spine), without myelopathy or radiculopathy (Nerve Damage).   Provide equipment / supplies at bedside    Procedure tray: "Block Tray" (Disposable  single use) Skin infiltration needle: Regular 1.5-in, 25-G, (x1) Block Needle type: Spinal Amount/quantity: 4 Size: Regular (3.5-inch) Gauge: 22G    Standing Status:   Standing    Number of Occurrences:   1    Specify:   Block Tray   Chronic Opioid Analgesic:   Hydrocodone/APAP 5/325 tablet, 1 tab p.o. twice daily (#60) MME/day: 10 mg/day   Medications ordered for procedure: Meds ordered this encounter  Medications   lidocaine (XYLOCAINE) 2 % (with pres) injection 400 mg   pentafluoroprop-tetrafluoroeth (GEBAUERS) aerosol   ropivacaine (  PF) 2 mg/mL (0.2%) (NAROPIN) injection 18 mL   DISCONTD: midazolam (VERSED) injection 0.5-2 mg    Make sure Flumazenil is available in the pyxis when using this medication. If oversedation occurs, administer 0.2 mg IV over 15 sec. If after 45 sec no response, administer 0.2 mg again over 1 min; may repeat at 1 min intervals; not to exceed 4 doses (1 mg)   Medications administered: We administered lidocaine, pentafluoroprop-tetrafluoroeth, and ropivacaine (PF) 2 mg/mL (0.2%).  See the medical record for  exact dosing, route, and time of administration.  Follow-up plan:   Return in about 2 weeks (around 06/08/2023) for (Face2F), (PPE).       Interventional Therapies  Risk Factors  Considerations:   WNL   Planned  Pending:   Diagnostic bilateral lumbar facet MBB #2 (without steroids)    Under consideration:   Diagnostic bilateral lumbar facet MBB #2 (without steroids) (PRN) Therapeutic right L5-S1 LESI #2  Possible spinal cord stimulator trial  Therapeutic left L5-S1 percutaneous discectomy with "[employer] Dekompressor" system    Completed:   Diagnostic bilateral lumbar facet MBB x1 (03/02/2023) (100/100/95/100)  Diagnostic midline to left caudal ESI x1 (04/02/2022) (100/100/100/LBP:100/LEP:100)  Therapeutic left L5-S1 LESI x3 (08/27/2022) (1st:100/100/100/LBP:85  LEP:100) (2nd: 01[birth year]13/59/LBP:60LLEP:100)  Therapeutic right L5-S1 LESI x1 (12/24/2022) (100/85/100/LBP:100)  Therapeutic bilateral Qutenza neurolytic treatment x1 (12/23/2021)  (09/08/2021 & 10/29/2021) referral to physical therapy for evaluation and treatment of low back pain.   Completed by other providers:   EMG/PNCV of lower extremity (02/20/2020) by Dr. Cristopher Peru (generalized sensorimotor peripheral neuropathy; superimposed left S1 radiculopathy)   Therapeutic  Palliative (PRN) options:   Diagnostic bilateral lumbar facet MBB #2 (without steroids) (PRN)      Recent Visits Date Type Provider Dept  05/19/23 Office Visit Delano Metz, MD [facility]  03/18/23 Office Visit Delano Metz, MD [facility]  03/02/23 Procedure visit Delano Metz, MD [facility]  Showing recent visits within past 90 days and meeting all other requirements Today's Visits Date Type Provider Dept  05/25/23 Procedure visit Delano Metz, MD [facility]  Showing today's visits and meeting all other requirements Future Appointments Date Type Provider Dept  06/08/23 Appointment  Delano Metz, MD [facility]  08/16/23 Appointment Delano Metz, MD [facility]  Showing future appointments within next 90 days and meeting all other requirements  Disposition: Discharge home  Discharge (Date  Time): 05/25/2023; 0852 hrs.   Primary Care Physician: Larae Grooms, NP Location: [facility] Note by: Oswaldo Done, MD (TTS technology used. I apologize for any typographical errors that were not detected and corrected.) Date: 05/25/2023; Time: 9:09 AM  Disclaimer:  Medicine is not an Visual merchandiser. The only guarantee in medicine is that nothing is guaranteed. It is important to note that the decision to proceed with this intervention was based on the information collected from the patient. The Data and conclusions were drawn from the patient's questionnaire, the interview, and the physical examination. Because the information was provided in large part by the patient, it cannot be guaranteed that it has not been purposely or unconsciously manipulated. Every effort has been made to obtain as much relevant data as possible for this evaluation. It is important to note that the conclusions that lead to this procedure are derived in large part from the available data. Always take into account that the treatment will also be dependent on availability of resources and existing treatment guidelines, considered by other Pain  Management Practitioners as being common knowledge and practice, at the time of the intervention. For Medico-Legal purposes, it is also important to point out that variation in procedural techniques and pharmacological choices are the acceptable norm. The indications, contraindications, technique, and results of the above procedure should only be interpreted and judged by a Board-Certified Interventional Pain Specialist with extensive familiarity and expertise in the same exact procedure and technique.

## 2023-05-25 NOTE — Patient Instructions (Signed)

## 2023-05-26 ENCOUNTER — Telehealth: Payer: Self-pay

## 2023-05-26 NOTE — Telephone Encounter (Signed)
 Post procedure follow up.  Patient states he is doing fine

## 2023-05-27 ENCOUNTER — Encounter: Payer: Self-pay | Admitting: Nurse Practitioner

## 2023-05-27 ENCOUNTER — Ambulatory Visit (INDEPENDENT_AMBULATORY_CARE_PROVIDER_SITE_OTHER): Payer: 59 | Admitting: Nurse Practitioner

## 2023-05-27 VITALS — BP 117/78 | HR 111 | Ht 74.0 in | Wt 248.6 lb

## 2023-05-27 DIAGNOSIS — E1169 Type 2 diabetes mellitus with other specified complication: Secondary | ICD-10-CM | POA: Diagnosis not present

## 2023-05-27 DIAGNOSIS — I152 Hypertension secondary to endocrine disorders: Secondary | ICD-10-CM

## 2023-05-27 DIAGNOSIS — E1159 Type 2 diabetes mellitus with other circulatory complications: Secondary | ICD-10-CM

## 2023-05-27 DIAGNOSIS — J449 Chronic obstructive pulmonary disease, unspecified: Secondary | ICD-10-CM

## 2023-05-27 DIAGNOSIS — I739 Peripheral vascular disease, unspecified: Secondary | ICD-10-CM

## 2023-05-27 DIAGNOSIS — E1140 Type 2 diabetes mellitus with diabetic neuropathy, unspecified: Secondary | ICD-10-CM | POA: Diagnosis not present

## 2023-05-27 DIAGNOSIS — Z794 Long term (current) use of insulin: Secondary | ICD-10-CM

## 2023-05-27 DIAGNOSIS — E785 Hyperlipidemia, unspecified: Secondary | ICD-10-CM

## 2023-05-27 DIAGNOSIS — E1142 Type 2 diabetes mellitus with diabetic polyneuropathy: Secondary | ICD-10-CM

## 2023-05-27 NOTE — Assessment & Plan Note (Signed)
 Chronic.  Controlled.  Continue with current medication regimen.  Labs ordered today.  Return to clinic in 6 months for reevaluation.  Call sooner if concerns arise.  ? ?

## 2023-05-27 NOTE — Assessment & Plan Note (Addendum)
 Chronic.  Controlled.  Continue with current medication regimen.  Followed by pain management.  Doing well at this time.   Return to clinic in 6 months for reevaluation.  Call sooner if concerns arise.

## 2023-05-27 NOTE — Progress Notes (Signed)
 BP 117/78 (BP Location: Right Arm, Patient Position: Sitting, Cuff Size: Large)   Pulse (!) 111   Ht 6\' 2"  (1.88 m)   Wt 248 lb 9.6 oz (112.8 kg)   SpO2 96%   BMI 31.92 kg/m    Subjective:    Patient ID: Darren Allen, male    DOB: 1965/11/20, 58 y.o.   MRN: [id number]  HPI: Darren Allen is a 58 y.o. male  Chief Complaint  Patient presents with   Diabetes   Hypertension   Hyperlipidemia   Medication Refill    Tazarotene 1%   HYPERTENSION / HYPERLIPIDEMIA Satisfied with current treatment? no Duration of hypertension: years BP monitoring frequency: daily BP range: 120/50 BP medication side effects: no Past BP meds:  Duration of hyperlipidemia: years Cholesterol medication side effects: no Cholesterol supplements: none Past cholesterol medications: rosuvastatin (crestor) Medication compliance: excellent compliance Aspirin: no Recent stressors: no Recurrent headaches: no Visual changes: no Palpitations: no Dyspnea: yes Chest pain: no Lower extremity edema: no Dizzy/lightheaded: no  DIABETES Followed by Dr. Tedd Sias.  A1c was 8.8% in February.  He is on Mounjaro 7.5mg  weekly.  Hypoglycemic episodes:no Polydipsia/polyuria: no Visual disturbance: no Chest pain: no Paresthesias: no Glucose Monitoring: yes  Accucheck frequency: continuous  Fasting glucose: 100-145  Post prandial: 240  Evening:  Before meals: Taking Insulin?: yes  Long acting insulin: 82u am and 42 u in the evening  Short acting insulin: possibly starting soon Blood Pressure Monitoring: daily  Retinal Examination: has it scheduled for october Foot Exam: Up to Date Diabetic Education: Not Completed Pneumovax: Up to Date Influenza: Not up to Date Aspirin: no  COPD COPD status:controlled Satisfied with current treatment?: no Oxygen use: no Dyspnea frequency: yes Cough frequency: yes Rescue inhaler frequency:  3/4 x per day Limitation of activity: yes Productive cough: yes Last  Spirometry:  Pneumovax: Up to Date Influenza: Not up to Date See's Pulmonology- quit smoking earlier this year.  Has noticed an improvement in congestion and breathing.  Continue with Breztri.    Relevant past medical, surgical, family and social history reviewed and updated as indicated. Interim medical history since our last visit reviewed. Allergies and medications reviewed and updated.  Review of Systems  Eyes:  Negative for visual disturbance.  Respiratory:  Positive for shortness of breath. Negative for chest tightness.   Cardiovascular:  Negative for chest pain, palpitations and leg swelling.  Endocrine: Negative for polydipsia and polyuria.  Neurological:  Negative for dizziness, light-headedness, numbness and headaches.    Per HPI unless specifically indicated above     Objective:    BP 117/78 (BP Location: Right Arm, Patient Position: Sitting, Cuff Size: Large)   Pulse (!) 111   Ht 6\' 2"  (1.88 m)   Wt 248 lb 9.6 oz (112.8 kg)   SpO2 96%   BMI 31.92 kg/m   Wt Readings from Last 3 Encounters:  05/27/23 248 lb 9.6 oz (112.8 kg)  05/25/23 248 lb (112.5 kg)  05/19/23 248 lb (112.5 kg)    Physical Exam Vitals and nursing note reviewed.  Constitutional:      General: He is not in acute distress.    Appearance: Normal appearance. He is not ill-appearing, toxic-appearing or diaphoretic.  HENT:     Head: Normocephalic.     Right Ear: External ear normal.     Left Ear: External ear normal.     Nose: Nose normal. No congestion or rhinorrhea.     Mouth/Throat:  Mouth: Mucous membranes are moist.  Eyes:     General:        Right eye: No discharge.        Left eye: No discharge.     Extraocular Movements: Extraocular movements intact.     Conjunctiva/sclera: Conjunctivae normal.     Pupils: Pupils are equal, round, and reactive to light.  Cardiovascular:     Rate and Rhythm: Normal rate and regular rhythm.     Heart sounds: No murmur heard. Pulmonary:      Effort: Pulmonary effort is normal. No respiratory distress.     Breath sounds: Normal breath sounds. No wheezing, rhonchi or rales.  Abdominal:     General: Abdomen is flat. Bowel sounds are normal.  Musculoskeletal:     Cervical back: Normal range of motion and neck supple.  Skin:    General: Skin is warm and dry.     Capillary Refill: Capillary refill takes less than 2 seconds.  Neurological:     General: No focal deficit present.     Mental Status: He is alert and oriented to person, place, and time.  Psychiatric:        Mood and Affect: Mood normal.        Behavior: Behavior normal.        Thought Content: Thought content normal.        Judgment: Judgment normal.     Results for orders placed or performed during the hospital encounter of 05/12/23  Resp panel by RT-PCR (RSV, Flu A&B, Covid) Anterior Nasal Swab   Collection Time: 05/12/23  2:22 PM   Specimen: Anterior Nasal Swab  Result Value Ref Range   SARS Coronavirus 2 by RT PCR NEGATIVE NEGATIVE   Influenza A by PCR NEGATIVE NEGATIVE   Influenza B by PCR NEGATIVE NEGATIVE   Resp Syncytial Virus by PCR NEGATIVE NEGATIVE      Assessment & Plan:   Problem List Items Addressed This Visit       Cardiovascular and Mediastinum   Hypertension associated with diabetes (HCC)   Chronic.  Well controlled.  Continue Lisinopril 10mg  and hydrochlorothiazide 25mg .  Labs are up to date.  Will check labs again at next visit.  Blood pressures are well controlled at home.  Follow up in 3 months.  Call sooner if concerns arise.       PVD (peripheral vascular disease) (HCC)   Chronic.  Controlled.  Continue with current medication regimen.  Labs ordered today.  Return to clinic in 6 months for reevaluation.  Call sooner if concerns arise.         Respiratory   Chronic obstructive pulmonary disease (HCC) - Primary (Chronic)   Chronic.  Controlled.  Using Breztri maintenance inhalers daily.  Still wheezing on exam.  Continue with  inhalers at home.  Quit smoking in 2024.  Continue to follow up with Pulmonology.  Follow up in 6 months.  Call sooner if concerns arise.         Endocrine   Diabetic peripheral neuropathy (HCC) (Chronic)   Chronic.  Controlled.  Continue with current medication regimen.  Followed by pain management.  Doing well at this time.   Return to clinic in 6 months for reevaluation.  Call sooner if concerns arise.       Type 2 diabetes mellitus with diabetic neuropathy, with long-term current use of insulin (HCC) (Chronic)   Chronic. Not well controlled.  Last A1c was 8.8%.  On Insulin 42u am and  82pm.  Sugars are running <200.  He is also on Mounjaro 7.5mg  weekly.  He is managed by Dr. Tedd Sias.   Doing well with [place].  Follow up in 3 months.  Call sooner if concerns arise.       Hyperlipidemia associated with type 2 diabetes mellitus (HCC)   Chronic.  Controlled.  Continue with current medication regimen of Crestor 40mg .  Labs ordered today.  Return to clinic in 6 months for reevaluation.  Call sooner if concerns arise.         Follow up plan: Return in about 3 months (around 08/27/2023) for HTN, HLD, DM2 FU.

## 2023-05-27 NOTE — Assessment & Plan Note (Signed)
 Chronic. Not well controlled.  Last A1c was 8.8%.  On Insulin 42u am and 82pm.  Sugars are running <200.  He is also on Mounjaro 7.5mg  weekly.  He is managed by Dr. Tedd Sias.   Doing well with [place].  Follow up in 3 months.  Call sooner if concerns arise.

## 2023-05-27 NOTE — Assessment & Plan Note (Signed)
 Chronic.  Controlled.  Using Breztri maintenance inhalers daily.  Still wheezing on exam.  Continue with inhalers at home.  Quit smoking in 2024.  Continue to follow up with Pulmonology.  Follow up in 6 months.  Call sooner if concerns arise.

## 2023-05-27 NOTE — Assessment & Plan Note (Signed)
Chronic.  Controlled.  Continue with current medication regimen of Crestor 40mg.  Labs ordered today.  Return to clinic in 6 months for reevaluation.  Call sooner if concerns arise.   

## 2023-05-27 NOTE — Assessment & Plan Note (Signed)
 Chronic.  Well controlled.  Continue Lisinopril 10mg  and hydrochlorothiazide 25mg .  Labs are up to date.  Will check labs again at next visit.  Blood pressures are well controlled at home.  Follow up in 3 months.  Call sooner if concerns arise.

## 2023-06-01 ENCOUNTER — Encounter: Payer: Self-pay | Admitting: Pain Medicine

## 2023-06-03 ENCOUNTER — Encounter: Payer: Self-pay | Admitting: Dermatology

## 2023-06-03 ENCOUNTER — Ambulatory Visit: Payer: 59 | Admitting: Dermatology

## 2023-06-03 DIAGNOSIS — L720 Epidermal cyst: Secondary | ICD-10-CM | POA: Diagnosis not present

## 2023-06-03 DIAGNOSIS — Z7189 Other specified counseling: Secondary | ICD-10-CM

## 2023-06-03 DIAGNOSIS — L719 Rosacea, unspecified: Secondary | ICD-10-CM | POA: Diagnosis not present

## 2023-06-03 DIAGNOSIS — B353 Tinea pedis: Secondary | ICD-10-CM

## 2023-06-03 DIAGNOSIS — B351 Tinea unguium: Secondary | ICD-10-CM

## 2023-06-03 DIAGNOSIS — L700 Acne vulgaris: Secondary | ICD-10-CM

## 2023-06-03 DIAGNOSIS — B354 Tinea corporis: Secondary | ICD-10-CM

## 2023-06-03 DIAGNOSIS — Z79899 Other long term (current) drug therapy: Secondary | ICD-10-CM

## 2023-06-03 MED ORDER — TAZAROTENE 0.1 % EX CREA: TOPICAL_CREAM | Freq: Every day | CUTANEOUS | 6 refills | Status: AC

## 2023-06-03 MED ORDER — METRONIDAZOLE 1 % EX GEL: Freq: Every day | CUTANEOUS | 6 refills | Status: DC

## 2023-06-03 MED ORDER — TERBINAFINE HCL 250 MG PO TABS: 250.0000 mg | ORAL_TABLET | Freq: Every day | ORAL | 0 refills | Status: AC

## 2023-06-03 NOTE — Patient Instructions (Addendum)
 For Fungus Start Terbinafine 250mg  1 pill a day for 12 weeks  For Acen on face and chest Restart Tazarotene 0.1% cream nightly to face and chest Continue Metronidazole 1% gel nightly to face (apply first then Tazarotene)   Due to recent changes in healthcare laws, you may see results of your pathology and/or laboratory studies on MyChart before the doctors have had a chance to review them. We understand that in some cases there may be results that are confusing or concerning to you. Please understand that not all results are received at the same time and often the doctors may need to interpret multiple results in order to provide you with the best plan of care or course of treatment. Therefore, we ask that you please give [place] 2 business days to thoroughly review all your results before contacting the office for clarification. Should we see a critical lab result, you will be contacted sooner.   If You Need Anything After Your Visit  If you have any questions or concerns for your doctor, please call our main line at [phone] and press option 4 to reach your doctor's medical assistant. If no one answers, please leave a voicemail as directed and we will return your call as soon as possible. Messages left after 4 pm will be answered the following business day.   You may also send [place] a message via MyChart. We typically respond to MyChart messages within 1-2 business days.  For prescription refills, please ask your pharmacy to contact our office. Our fax number is [phone].  If you have an urgent issue when the clinic is closed that cannot wait until the next business day, you can page your doctor at the number below.    Please note that while we do our best to be available for urgent issues outside of office hours, we are not available 24/7.   If you have an urgent issue and are unable to reach [place], you may choose to seek medical care at your doctor's office, retail clinic, urgent care center,  or emergency room.  If you have a medical emergency, please immediately call 911 or go to the emergency department.  Pager Numbers  - Dr. Gwen Pounds: [phone]  - Dr. Roseanne Reno: [phone]  - Dr. Katrinka Blazing: [phone]   In the event of inclement weather, please call our main line at [phone] for an update on the status of any delays or closures.  Dermatology Medication Tips: Please keep the boxes that topical medications come in in order to help keep track of the instructions about where and how to use these. Pharmacies typically print the medication instructions only on the boxes and not directly on the medication tubes.   If your medication is too expensive, please contact our office at [phone] option 4 or send [place] a message through MyChart.   We are unable to tell what your co-pay for medications will be in advance as this is different depending on your insurance coverage. However, we may be able to find a substitute medication at lower cost or fill out paperwork to get insurance to cover a needed medication.   If a prior authorization is required to get your medication covered by your insurance company, please allow [place] 1-2 business days to complete this process.  Drug prices often vary depending on where the prescription is filled and some pharmacies may offer cheaper prices.  The website www.goodrx.com contains coupons for medications through different pharmacies. The prices here do not account for what  the cost may be with help from insurance (it may be cheaper with your insurance), but the website can give you the price if you did not use any insurance.  - You can print the associated coupon and take it with your prescription to the pharmacy.  - You may also stop by our office during regular business hours and pick up a GoodRx coupon card.  - If you need your prescription sent electronically to a different pharmacy, notify our office through [facility] or by phone  at [phone] option 4.     Si Usted Necesita Algo Despus de Su Visita  Tambin puede enviarnos un mensaje a travs de Clinical cytogeneticist. Por lo general respondemos a los mensajes de MyChart en el transcurso de 1 a 2 das hbiles.  Para renovar recetas, por favor pida a su farmacia que se ponga en contacto con nuestra oficina. Annie Sable de fax es [place] [phone].  Si tiene un asunto urgente cuando la clnica est cerrada y que no puede esperar hasta el siguiente da hbil, puede llamar/localizar a su doctor(a) al nmero que aparece a continuacin.   Por favor, tenga en cuenta que aunque hacemos todo lo posible para estar disponibles para asuntos urgentes fuera del horario de [place], no estamos disponibles las 24 horas del da, los 7 [address]  [address] de la [place].   Si tiene un problema urgente y no puede comunicarse con nosotros, puede optar por buscar atencin mdica  en el consultorio de su doctor(a), en una clnica privada, en un centro de atencin urgente o en una sala de emergencias.  Si tiene Engineer, drilling, por favor llame inmediatamente al 911 o vaya a la sala de emergencias.  Nmeros de bper  - Dr. Gwen Pounds: [phone]  - Dra. Roseanne Reno: [phone]  - Dr. Katrinka Blazing: [phone]   En caso de inclemencias del tiempo, por favor llame a Lacy Duverney principal al [phone] para una actualizacin sobre el [place] de cualquier retraso o cierre.  Consejos para la medicacin en dermatologa: Por favor, guarde las cajas en las que vienen los medicamentos de uso tpico para ayudarle a seguir las instrucciones sobre dnde y cmo usarlos. Las farmacias generalmente imprimen las instrucciones del medicamento slo en las cajas y no directamente en los tubos del [place].   Si su medicamento es muy caro, por favor, pngase en contacto con Rolm [place]ala llamando al [phone] y presione la opcin 4 o envenos un mensaje a travs de Clinical cytogeneticist.   No podemos decirle cul ser su copago por  los medicamentos por adelantado ya que esto es diferente dependiendo de la cobertura de su seguro. Sin embargo, es posible que podamos encontrar un medicamento sustituto a Audiological scientist un formulario para que el seguro cubra el medicamento que se considera necesario.   Si se requiere una autorizacin previa para que su compaa de seguros [place] su medicamento, por favor permtanos de 1 a 2 das hbiles para completar [address]  Los precios de los medicamentos varan con frecuencia dependiendo del Environmental consultant de dnde se surte la receta y alguna farmacias pueden ofrecer precios ms baratos.  El sitio web www.goodrx.com tiene cupones para medicamentos de Health and safety inspector. Los precios aqu no tienen en cuenta lo que podra costar con la ayuda del seguro (puede ser ms barato con su seguro), pero el sitio web puede darle el precio si no utiliz Tourist information centre manager.  - Puede imprimir el cupn correspondiente y llevarlo con su receta a la farmacia.  - Tambin puede  pasar por nuestra oficina durante el horario de atencin regular y Education officer, museum una tarjeta de cupones de GoodRx.  - Si necesita que su receta se enve electrnicamente a una farmacia diferente, informe a nuestra oficina a travs de MyChart de [place] o por telfono llamando al [phone] y presione la opcin 4.

## 2023-06-03 NOTE — Progress Notes (Signed)
 Follow-Up Visit   Subjective  Darren Allen is a 58 y.o. male who presents for the following: Acne face, chest, pt stop Metronidazole 1% gel 3 days ago, Tazarotene 0.1% cr qhs pt previously had  2 months of Isotretinoin back 04/2021 had to d/c due being on Antibiotics from an infection not r/t Isotretinoin, check spot L post ankle The patient has spots, moles and lesions to be evaluated, some may be new or changing and the patient may have concern these could be cancer.   The following portions of the chart were reviewed this encounter and updated as appropriate: medications, allergies, medical history  Review of Systems:  No other skin or systemic complaints except as noted in HPI or Assessment and Plan.  Objective  Well appearing patient in no apparent distress; mood and affect are within normal limits.   A focused examination was performed of the following areas: Face, chest  Relevant exam findings are noted in the Assessment and Plan.          Assessment & Plan   ACNE VULGARIS/milia and inflammatory rosacea Face, chest Exam: cystic paps face, chest. Inflamed papules medial cheeks  Avoiding isotretinoin given risk of pancreatitis with known hyperlipidemia (400s and 500s for last 2 years)  Chronic and persistent condition with duration or expected duration over one year. Condition is bothersome/symptomatic for patient. Currently flared.   Treatment Plan: Cont Tazarotene 0.1% cr qhs as tolerated to face and chest Restart Metronidazole 1% gel qhs to face  Topical retinoid medications like tretinoin/Retin-A, adapalene/Differin, tazarotene/Fabior, and Epiduo/Epiduo Forte can cause dryness and irritation when first started. Only apply a pea-sized amount to the entire affected area. Avoid applying it around the eyes, edges of mouth and creases at the nose. If you experience irritation, use a good moisturizer first and/or apply the medicine less often. If you are doing  well with the medicine, you can increase how often you use it until you are applying every night. Be careful with sun protection while using this medication as it can make you sensitive to the sun. This medicine should not be used by pregnant women.    Long term medication management.  Patient is using long term (months to years) prescription medication  to control their dermatologic condition.  These medications require periodic monitoring to evaluate for efficacy and side effects and may require periodic laboratory monitoring.   TINEA PEDIS/Corporis with TINEA UNGUIUM, chronic flaring not at goal Feet, toenails, L post ankle Labs from 02/25/23 CBC (low hgb 11.6) AST ALT normal Exam: scaly pink annular patches on distal lower legs and plantar feet, toenail onychauxis,discoloration and subungual debris  Treatment Plan: Start Terbinafine 250mg  1 po qd x 3 months  Terbinafine Counseling  Terbinafine is an anti-fungal medicine that can be applied to the skin (over the counter) or taken by mouth (prescription) to treat fungal infections. The pill version is often used to treat fungal infections of the nails or scalp. While most people do not have any side effects from taking terbinafine pills, some possible side effects of the medicine can include taste changes, headache, loss of smell, vision changes, nausea, vomiting, or diarrhea.   Rare side effects can include irritation of the liver, allergic reaction, or decrease in blood counts (which may show up as not feeling well or developing an infection). If you are concerned about any of these side effects, please stop the medicine and call your doctor, or in the case of an emergency such as  feeling very unwell, seek immediate medical care.     MILIA   ACNE VULGARIS   TINEA PEDIS OF BOTH FEET   ONYCHOMYCOSIS   TINEA CORPORIS   ROSACEA    Return for 3-74m f/u, Acne, Tinea Pedis/Unguium.  I, Ardis Rowan, RMA, am acting as scribe for  Elie Goody, MD .   Documentation: I have reviewed the above documentation for accuracy and completeness, and I agree with the above.  Elie Goody, MD

## 2023-06-06 NOTE — Progress Notes (Unsigned)
 PROVIDER NOTE: Information contained herein reflects review and annotations entered in association with encounter. Interpretation of such information and data should be left to medically-trained personnel. Information provided to patient can be located elsewhere in the medical record under "Patient Instructions". Document created using STT-dictation technology, any transcriptional errors that may result from process are unintentional.    Patient: Darren Allen  Service Category: E/M  Provider: Oswaldo Done, MD  DOB: Jan 08, 1966  DOS: 06/07/2023  Referring Provider: Larae Grooms, NP  MRN: [id number]  Specialty: Interventional Pain Management  PCP: Larae Grooms, NP  Type: Established Patient  Setting: Ambulatory outpatient    Location: Office  Delivery: Face-to-face     HPI  Darren Allen, a 58 y.o. year old male, is here today because of his Chronic bilateral low back pain without sciatica [M54.50, G89.29]. Darren Allen primary complain today is No chief complaint on file.  Pertinent problems: Darren Allen has Ankle fracture; Neuropathy; Diabetic peripheral neuropathy (HCC); Gout; Chronic ankle pain (Bilateral); Chronic low back pain (1ry area of Pain) (Bilateral) (R>L) w/o sciatica; Other acquired hammer toe; Chronic pain syndrome; Lumbar facet syndrome; Lumbosacral radiculopathy at S1 (Left); Chronic feet pain (2ry area of Pain) (Bilateral); Chronic ankle pain (Left); Abnormal NCS (nerve conduction studies) (02/20/2020); Abnormal MRI, lumbar spine (03/11/2020); Lumbosacral lateral recess stenosis (Left: L5-S1); Chronic low back pain (Bilateral) w/ sciatica (Left); Abnormal MRI, thoracic spine (07/05/2020); Chronic low back pain (Bilateral) w/ sciatica (Right); Chronic lower extremity pain (intermittent) (Right); Chronic lower extremity pain (intermittent) (Left); Lumbar radiculitis (Right); Lumbar radiculitis (Left); Lumbar facet joint pain; Spondylosis without myelopathy or  radiculopathy, lumbosacral region; and DDD (degenerative disc disease), lumbosacral w/ LBP on their pertinent problem list. Pain Assessment: Severity of   is reported as a  /10. Location:    / . Onset:  . Quality:  . Timing:  . Modifying factor(s):  Marland Kitchen Vitals:  vitals were not taken for this visit.  BMI: Estimated body mass index is 31.92 kg/m as calculated from the following:   Height as of 05/27/23: 6\' 2"  (1.88 m).   Weight as of 05/27/23: 248 lb 9.6 oz (112.8 kg). Last encounter: 05/19/2023. Last procedure: 05/25/2023.  Reason for encounter: post-procedure evaluation and assessment. ***  Discussed the use of AI scribe software for clinical note transcription with the patient, who gave verbal consent to proceed.  History of Present Illness          Post-procedure evaluation   Type: Lumbar Facet, Medial Branch Block(s) (w/ fluoroscopic mapping) #2 (w/o steroids) Laterality: Bilateral  Level: L2, L3, L4, L5, and S1 Medial Branch Level(s). Injecting these levels blocks the L3-4, L4-5, and L5-S1 lumbar facet joints. Imaging: Fluoroscopic guidance Spinal ([id number]) Anesthesia: Local anesthesia (1-2% Lidocaine) Anxiolysis: None                 Sedation: No Sedation                       DOS: 05/25/2023 Performed by: Oswaldo Done, MD  Primary Purpose: Diagnostic/Therapeutic Indications: Low back pain severe enough to impact quality of life or function. 1. Chronic low back pain (1ry area of Pain) (Bilateral) (R>L) w/o sciatica   2. DDD (degenerative disc disease), lumbosacral w/ LBP   3. Lumbar facet joint pain   4. Lumbar facet syndrome   5. Spondylosis without myelopathy or radiculopathy, lumbosacral region   6. Abnormal MRI, lumbar spine (03/11/2020)    NAS-11 Pain score:  Pre-procedure: 3 /10   Post-procedure: 3 /10     Effectiveness:  Initial hour after procedure:   ***. Subsequent 4-6 hours post-procedure:   ***. Analgesia past initial 6 hours:   ***. Ongoing  improvement:  Analgesic:  *** Function:    ***    ROM:    ***      Pharmacotherapy Assessment  Analgesic: Hydrocodone/APAP 5/325 tablet, 1 tab p.o. twice daily (#60) MME/day: 10 mg/day   [place]MP reviewed during this encounter.       Pharmacotherapy: No side-effects or adverse reactions reported. Compliance: No problems identified. Effectiveness: Clinically acceptable.  No notes on file  No results found for: "CBDTHCR" No results found for: "D8THCCBX" No results found for: "D9THCCBX"  UDS:  Summary  Date Value Ref Range Status  08/24/2022 Note  Final    Comment:    ==================================================================== [employer] [facility] 13 (MW) ==================================================================== Test                             Result       Flag       Units  Drug Present and Declared for Prescription Verification   Hydrocodone                    312          EXPECTED   ng/mg creat   Norhydrocodone                 398          EXPECTED   ng/mg creat    Sources of hydrocodone include scheduled prescription medications.    Norhydrocodone is an expected metabolite of hydrocodone.  ==================================================================== Test                      Result    Flag   Units      Ref Range   Creatinine              161              mg/dL      >=01 ==================================================================== Declared Medications:  The flagging and interpretation on this report are based on the  following declared medications.  Unexpected results may arise from  inaccuracies in the declared medications.   **Note: The testing scope of this panel includes these medications:   Hydrocodone (Norco)   **Note: The testing scope of this panel does not include the  following reported medications:   Acetaminophen (Tylenol)  Acetaminophen (Norco)  Albuterol (Proair HFA)  Albuterol (Duoneb)   Amitriptyline (Elavil)  Epinephrine (EpiPen)  Fluticasone (Trelegy)  Fluticasone (Flonase)  Gabapentin (Neurontin)  Hydrochlorothiazide (Hydrodiuril)  Insulin (Humulin)  Ipratropium (Duoneb)  Iron  Lisinopril (Zestril)  Magnesium (Mag-Ox)  Meloxicam (Mobic)  Metformin (Glucophage)  Montelukast (Singulair)  Naloxone (Narcan)  Ondansetron (Zofran)  Prednisone (Deltasone)  Rosuvastatin (Crestor)  Semaglutide  Sulfamethoxazole (Bactrim)  Tadalafil (Cialis)  Tamsulosin (Flomax)  Testosterone  Triamcinolone (Kenalog)  Trimethoprim (Bactrim)  Umeclidinium (Trelegy)  Vilanterol (Trelegy)  Vitamin C ==================================================================== For clinical consultation, please call [phone]. ====================================================================       ROS  Constitutional: Denies any fever or chills Gastrointestinal: No reported hemesis, hematochezia, vomiting, or acute GI distress Musculoskeletal: Denies any acute onset joint swelling, redness, loss of ROM, or weakness Neurological: No reported episodes of acute onset apraxia, aphasia, dysarthria, agnosia, amnesia, paralysis, loss of coordination, or loss of  consciousness  Medication Review  HYDROcodone-acetaminophen, Magnesium Oxide -Mg Supplement, NEEDLE (DISP) 18 G, NEEDLE (DISP) 21 G, PRESCRIPTION MEDICATION, albuterol, amitriptyline, ascorbic acid, budeson-glycopyrrolate-formoterol, clotrimazole-betamethasone, cyanocobalamin, dapagliflozin propanediol, ferrous sulfate, fluticasone, gabapentin, hydrochlorothiazide, insulin isophane & regular human KwikPen, ipratropium, ipratropium-albuterol, lisinopril, metFORMIN, metroNIDAZOLE, montelukast, naloxone, pantoprazole, rosuvastatin, tazarotene, terbinafine, testosterone cypionate, and tirzepatide  History Review  Allergy: Darren Allen is allergic to glipizide, cephalexin, duloxetine, and dupixent [dupilumab]. Drug: Darren Allen  reports no  history of drug use. Alcohol:  reports that he does not currently use alcohol after a past usage of about 12.0 standard drinks of alcohol per week. Tobacco:  reports that he has quit smoking. His smoking use included cigarettes. He has a 17 pack-year smoking history. He has been exposed to tobacco smoke. He has never used smokeless tobacco. Social: Darren Allen  reports that he has quit smoking. His smoking use included cigarettes. He has a 17 pack-year smoking history. He has been exposed to tobacco smoke. He has never used smokeless tobacco. He reports that he does not currently use alcohol after a past usage of about 12.0 standard drinks of alcohol per week. He reports that he does not use drugs. Medical:  has a past medical history of Allergy, Asthma, Chronic lower back pain, COPD (chronic obstructive pulmonary disease) (HCC), Coronary artery disease, DDD (degenerative disc disease), lumbosacral, Depression, Diabetic peripheral neuropathy (HCC), Diastolic dysfunction (12/27/2020), Diverticulosis, Erectile dysfunction, GERD (gastroesophageal reflux disease), Hepatic steatosis, History of kidney stones, Hyperlipidemia, Hypertension, Hypogonadism male, IDA (iron deficiency anemia), Long term current use of opiate analgesic, OSA on CPAP, Paraesophageal hernia, PVD (peripheral vascular disease) (HCC), T2DM (type 2 diabetes mellitus) (HCC), Umbilical hernia, and Ventral hernia. Surgical: Darren Allen  has a past surgical history that includes Appendectomy; Incision and drainage perirectal abscess (N/A, 02/04/2015); Rectal exam under anesthesia (02/04/2015); ORIF ankle fracture (Left, 03/23/2017); Syndesmosis repair (Left, 03/23/2017); Colonoscopy with propofol (N/A, 12/31/2021); Esophagogastroduodenoscopy (N/A, 12/31/2021); Kidney stone surgery (Right); lung mass removal (N/A); Xi robotic assisted paraesophageal hernia repair (N/A, 01/20/2022); Insertion of mesh (01/20/2022); Umbilical hernia repair (01/20/2022);  Vasectomy; Circumcision; Bronchoscopy (2016); Muscle biopsy; Ventral hernia repair (N/A, 04/08/2023); and Insertion of mesh (N/A, 04/08/2023). Family: family history includes Alcohol abuse in his father; Cancer in his father; Cancer (age of onset: 75) in his brother; Cancer (age of onset: 2) in his mother; Diabetes in his brother; Heart disease in his brother and father.  Laboratory Chemistry Profile   Renal Lab Results  Component Value Date   BUN 13 02/25/2023   CREATININE 1.15 02/25/2023   BCR 11 02/25/2023   GFRAA 59 (L) 12/07/2019   GFRNONAA >60 02/15/2022    Hepatic Lab Results  Component Value Date   AST 34 02/25/2023   ALT 39 02/25/2023   ALBUMIN 4.2 02/25/2023   ALKPHOS 59 02/25/2023   AMYLASE 93 02/04/2022   LIPASE 115 (H) 02/04/2022    Electrolytes Lab Results  Component Value Date   NA 137 02/25/2023   K 3.5 02/25/2023   CL 96 02/25/2023   CALCIUM 9.8 02/25/2023   MG 1.8 01/25/2022   PHOS 2.3 (L) 01/23/2022    Bone Lab Results  Component Value Date   25OHVITD1 44 09/08/2021   25OHVITD2 <1.0 09/08/2021   25OHVITD3 43 09/08/2021   TESTOSTERONE 575 02/23/2023    Inflammation (CRP: Acute Phase) (ESR: Chronic Phase) Lab Results  Component Value Date   CRP 1.8 (H) 09/08/2021   ESRSEDRATE 17 09/08/2021   LATICACIDVEN 1.6 02/03/2022  Note: Above Lab results reviewed.  Recent Imaging Review  CT CHEST LUNG CA SCREEN LOW DOSE W/O CM CLINICAL DATA:  Former 40 pack-year smoker, quit 18 months ago.  EXAM: CT CHEST WITHOUT CONTRAST LOW-DOSE FOR LUNG CANCER SCREENING  TECHNIQUE: Multidetector CT imaging of the chest was performed following the standard protocol without IV contrast.  RADIATION DOSE REDUCTION: This exam was performed according to the departmental dose-optimization program which includes automated exposure control, adjustment of the mA and/or kV according to patient size and/or use of iterative reconstruction technique.  COMPARISON:   02/16/2022.  FINDINGS: Cardiovascular: Atherosclerotic calcification of the aorta and aortic valve with age advanced involvement of the left anterior descending and right coronary arteries. Heart is at the upper limits of normal in size. No pericardial effusion.  Mediastinum/Nodes: No pathologically enlarged mediastinal or axillary lymph nodes. Hilar regions are difficult to definitively evaluate without IV contrast. Esophagus is grossly unremarkable.  Lungs/Pleura: Centrilobular and paraseptal emphysema. 7.7 mm posterior right upper lobe nodule (3/145). No additional pulmonary nodules. No pleural fluid. Airway is unremarkable.  Upper Abdomen: Hiatal hernia repair. Visualized portions of the liver, adrenal glands, right kidney, spleen, pancreas and stomach are otherwise grossly unremarkable. No upper abdominal adenopathy.  Musculoskeletal: Degenerative changes in the spine. Old left rib fracture.  IMPRESSION: 1. 7.7 mm posterior right upper lobe nodule. Lung-RADS 3, probably benign findings. Short-term follow-up in 6 months is recommended with repeat low-dose chest CT without contrast (please use the following order, "CT CHEST LCS NODULE FOLLOW-UP W/O CM"). These results will be called to the ordering clinician or representative by the Radiologist Assistant, and communication documented in the PACS or [employer]. 2. Age advanced 2 vessel coronary artery calcification. 3.  Aortic atherosclerosis (ICD10-I70.0). 4.  Emphysema (ICD10-J43.9).  Electronically Signed   By: Leanna Battles M.D.   On: 06/02/2023 11:25 Note: Reviewed        Physical Exam  General appearance: Well nourished, well developed, and well hydrated. In no apparent acute distress Mental status: Alert, oriented x 3 (person, place, & time)       Respiratory: No evidence of acute respiratory distress Eyes: PERLA Vitals: There were no vitals taken for this visit. BMI: Estimated body mass index is 31.92  kg/m as calculated from the following:   Height as of 05/27/23: 6\' 2"  (1.88 m).   Weight as of 05/27/23: 248 lb 9.6 oz (112.8 kg). Ideal: Ideal body weight: 82.2 kg (181 lb 3.5 oz) Adjusted ideal body weight: 94.4 kg (208 lb 2.7 oz)  Assessment   Diagnosis Status  1. Chronic low back pain (1ry area of Pain) (Bilateral) (R>L) w/o sciatica   2. Lumbar facet joint pain   3. Lumbar facet syndrome   4. Postop check    Controlled Controlled Controlled   Updated Problems: No problems updated.  Plan of Care  Problem-specific:  Assessment and Plan            Darren Allen has a current medication list which includes the following long-term medication(s): ferrous sulfate, fluticasone, hydrochlorothiazide, [START ON 06/21/2023] hydrocodone-acetaminophen, [START ON 07/21/2023] hydrocodone-acetaminophen, humulin 70/30 kwikpen, ipratropium, lisinopril, metformin, montelukast, rosuvastatin, and testosterone cypionate.  Pharmacotherapy (Medications Ordered): No orders of the defined types were placed in this encounter.  Orders:  No orders of the defined types were placed in this encounter.  Follow-up plan:   No follow-ups on file.      Interventional Therapies  Risk Factors  Considerations:   WNL   Planned  Pending:   Diagnostic bilateral lumbar facet MBB #2 (without steroids)    Under consideration:   Diagnostic bilateral lumbar facet MBB #2 (without steroids) (PRN) Therapeutic right L5-S1 LESI #2  Possible spinal cord stimulator trial  Therapeutic left L5-S1 percutaneous discectomy with "[employer] Dekompressor" system    Completed:   Diagnostic bilateral lumbar facet MBB x1 (03/02/2023) (100/100/95/100)  Diagnostic midline to left caudal ESI x1 (04/02/2022) (100/100/100/LBP:100/LEP:100)  Therapeutic left L5-S1 LESI x3 (08/27/2022) (1st:100/100/100/LBP:85  LEP:100) (2nd: 01/13/59/LBP:60LLEP:100)  Therapeutic right L5-S1 LESI x1 (12/24/2022) (100/85/100/LBP:100)   Therapeutic bilateral Qutenza neurolytic treatment x1 (12/23/2021)  (09/08/2021 & 10/29/2021) referral to physical therapy for evaluation and treatment of low back pain.   Completed by other providers:   EMG/PNCV of lower extremity (02/20/2020) by Dr. Cristopher Peru (generalized sensorimotor peripheral neuropathy; superimposed left S1 radiculopathy)   Therapeutic  Palliative (PRN) options:   Diagnostic bilateral lumbar facet MBB #2 (without steroids) (PRN)      Recent Visits Date Type Provider Dept  05/25/23 Procedure visit Delano Metz, MD [facility]  05/19/23 Office Visit Delano Metz, MD [facility]  03/18/23 Office Visit Delano Metz, MD [facility]  Showing recent visits within past 90 days and meeting all other requirements Future Appointments Date Type Provider Dept  06/07/23 Appointment Delano Metz, MD [facility]  08/16/23 Appointment Delano Metz, MD [facility]  Showing future appointments within next 90 days and meeting all other requirements  I discussed the assessment and treatment plan with the patient. The patient was provided an opportunity to ask questions and all were answered. The patient agreed with the plan and demonstrated an understanding of the instructions.  Patient advised to call back or seek an in-person evaluation if the symptoms or condition worsens.  Duration of encounter: *** minutes.  Total time on encounter, as per AMA guidelines included both the face-to-face and non-face-to-face time personally spent by the physician and/or other qualified health care professional(s) on the day of the encounter (includes time in activities that require the physician or other qualified health care professional and does not include time in activities normally performed by clinical staff). Physician's time may include the following activities when performed: Preparing to see the patient (e.g.,  pre-charting review of records, searching for previously ordered imaging, lab work, and nerve conduction tests) Review of prior analgesic pharmacotherapies. Reviewing PMP Interpreting ordered tests (e.g., lab work, imaging, nerve conduction tests) Performing post-procedure evaluations, including interpretation of diagnostic procedures Obtaining and/or reviewing separately obtained history Performing a medically appropriate examination and/or evaluation Counseling and educating the patient/family/caregiver Ordering medications, tests, or procedures Referring and communicating with other health care professionals (when not separately reported) Documenting clinical information in the electronic or other health record Independently interpreting results (not separately reported) and communicating results to the patient/ family/caregiver Care coordination (not separately reported)  Note by: Oswaldo Done, MD Date: 06/07/2023; Time: 5:56 AM

## 2023-06-07 ENCOUNTER — Ambulatory Visit: Attending: Pain Medicine | Admitting: Pain Medicine

## 2023-06-07 ENCOUNTER — Encounter: Payer: Self-pay | Admitting: Pain Medicine

## 2023-06-07 ENCOUNTER — Ambulatory Visit: Payer: 59

## 2023-06-07 VITALS — BP 126/91 | HR 102 | Temp 98.4°F | Resp 16 | Ht 74.0 in | Wt 248.0 lb

## 2023-06-07 DIAGNOSIS — M47816 Spondylosis without myelopathy or radiculopathy, lumbar region: Secondary | ICD-10-CM | POA: Diagnosis not present

## 2023-06-07 DIAGNOSIS — M5459 Other low back pain: Secondary | ICD-10-CM | POA: Diagnosis not present

## 2023-06-07 DIAGNOSIS — Z794 Long term (current) use of insulin: Secondary | ICD-10-CM | POA: Diagnosis present

## 2023-06-07 DIAGNOSIS — Z09 Encounter for follow-up examination after completed treatment for conditions other than malignant neoplasm: Secondary | ICD-10-CM | POA: Diagnosis not present

## 2023-06-07 DIAGNOSIS — M545 Low back pain, unspecified: Secondary | ICD-10-CM | POA: Insufficient documentation

## 2023-06-07 DIAGNOSIS — Z0000 Encounter for general adult medical examination without abnormal findings: Secondary | ICD-10-CM

## 2023-06-07 DIAGNOSIS — G8929 Other chronic pain: Secondary | ICD-10-CM | POA: Diagnosis present

## 2023-06-07 DIAGNOSIS — E1140 Type 2 diabetes mellitus with diabetic neuropathy, unspecified: Secondary | ICD-10-CM | POA: Insufficient documentation

## 2023-06-07 NOTE — Patient Instructions (Addendum)
 Radiofrequency Ablation Radiofrequency ablation is a procedure that is performed to relieve pain. The procedure is often used for back, neck, or arm pain. Radiofrequency ablation involves the use of a machine that creates radio waves to make heat. During the procedure, the heat is applied to the nerve that carries the pain signal. The heat damages the nerve and interferes with the pain signal. Pain relief usually starts about 2 weeks after the procedure and lasts for 6 months to 1 year. Tell a health care provider about: Any allergies you have. All medicines you are taking, including vitamins, herbs, eye drops, creams, and over-the-counter medicines. Any problems you or family members have had with anesthetic medicines. Any bleeding problems you have. Any surgeries you have had. Any medical conditions you have. Whether you are pregnant or may be pregnant. What are the risks? Generally, this is a safe procedure. However, problems may occur, including: Pain or soreness at the injection site. Allergic reaction to medicines given during the procedure. Bleeding. Infection at the injection site. Damage to nerves or blood vessels. What happens before the procedure? When to stop eating and drinking Follow instructions from your health care provider about what you may eat and drink before your procedure. These may include: 8 hours before the procedure Stop eating most foods. Do not eat meat, fried foods, or fatty foods. Eat only light foods, such as toast or crackers. All liquids are okay except energy drinks and alcohol. 6 hours before the procedure Stop eating. Drink only clear liquids, such as water, clear fruit juice, black coffee, plain tea, and sports drinks. Do not drink energy drinks or alcohol. 2 hours before the procedure Stop drinking all liquids. You may be allowed to take medicine with small sips of water. If you do not follow your health care provider's instructions, your  procedure may be delayed or canceled. Medicines Ask your health care provider about: Changing or stopping your regular medicines. This is especially important if you are taking diabetes medicines or blood thinners. Taking medicines such as aspirin and ibuprofen. These medicines can thin your blood. Do not take these medicines unless your health care provider tells you to take them. Taking over-the-counter medicines, vitamins, herbs, and supplements. General instructions Ask your health care provider what steps will be taken to help prevent infection. These steps may include: Removing hair at the procedure site. Washing skin with a germ-killing soap. Taking antibiotic medicine. If you will be going home right after the procedure, plan to have a responsible adult: Take you home from the hospital or clinic. You will not be allowed to drive. Care for you for the time you are told. What happens during the procedure?  You will be awake during the procedure. You will need to be able to talk with the health care provider during the procedure. An IV will be inserted into one of your veins. You will be given one or more of the following: A medicine to help you relax (sedative). A medicine to numb the area (local anesthetic). Your health care provider will insert a radiofrequency needle into the area to be treated. This is done with the help of fluoroscopy. A wire that carries the radio waves (electrode) will be put through the radiofrequency needle. An electrical pulse will be sent through the electrode to verify the correct nerve that is causing your pain. You will feel a tingling sensation, and you may have muscle twitching. The tissue around the needle tip will be heated by an  electric current that comes from the radiofrequency machine. This will numb the nerves. The needle will be removed. A bandage (dressing) will be put on the insertion area. The procedure may vary among health care providers  and hospitals. What happens after the procedure? Your blood pressure, heart rate, breathing rate, and blood oxygen level will be monitored until you leave the hospital or clinic. Return to your normal activities as told by your health care provider. Ask your health care provider what activities are safe for you. If you were given a sedative during the procedure, it can affect you for several hours. Do not drive or operate machinery until your health care provider says that it is safe. Summary Radiofrequency ablation is a procedure that is performed to relieve pain. The procedure is often used for back, neck, or arm pain. Radiofrequency ablation involves the use of a machine that creates radio waves to make heat. Plan to have a responsible adult take you home from the hospital or clinic. Do not drive or operate machinery until your health care provider says that it is safe. Return to your normal activities as told by your health care provider. Ask your health care provider what activities are safe for you. This information is not intended to replace advice given to you by your health care provider. Make sure you discuss any questions you have with your health care provider. Document Revised: 08/20/2020 Document Reviewed: 08/20/2020 [employer] Patient Education  2024 [employer]Procedure instructions  Do not eat or drink fluids (other than water) for 6 hours before your procedure  No water for 2 hours before your procedure  Take your blood pressure medicine with a sip of water  Arrive 30 minutes before your appointment  Carefully read the "Preparing for your procedure" detailed instructions  If you have questions call us at [phone]  _____________________________________________________________________    ______________________________________________________________________  Preparing for your procedure  Appointments: If you think you may not be able to keep your appointment,  call 24-48 hours in advance to cancel. We need time to make it available to others.  During your procedure appointment there will be: No Prescription Refills. No disability issues to discussed. No medication changes or discussions.  Instructions: Food intake: Avoid eating anything solid for at least 8 hours prior to your procedure. Clear liquid intake: You may take clear liquids such as water up to 2 hours prior to your procedure. (No carbonated drinks. No soda.) Transportation: Unless otherwise stated by your physician, bring a driver. Morning Medicines: Except for blood thinners, take all of your other morning medications with a sip of water. Make sure to take your heart and blood pressure medicines. If your blood pressure's lower number is above 100, the case will be rescheduled. Blood thinners: Make sure to stop your blood thinners as instructed.  If you take a blood thinner, but were not instructed to stop it, call our office [phone] and ask to talk to a nurse. Not stopping a blood thinner prior to certain procedures could lead to serious complications. Diabetics on insulin: Notify the staff so that you can be scheduled 1st case in the morning. If your diabetes requires high dose insulin, take only  of your normal insulin dose the morning of the procedure and notify the staff that you have done so. Preventing infections: Shower with an antibacterial soap the morning of your procedure.  Build-up your immune system: Take 1000 mg of Vitamin C with every meal (3 times a day)  the day prior to your procedure. Antibiotics: Inform the nursing staff if you are taking any antibiotics or if you have any conditions that may require antibiotics prior to procedures. (Example: recent joint implants)   Pregnancy: If you are pregnant make sure to notify the nursing staff. Not doing so may result in injury to the fetus, including death.  Sickness: If you have a cold, fever, or any active infections,  call and cancel or reschedule your procedure. Receiving steroids while having an infection may result in complications. Arrival: You must be in the facility at least 30 minutes prior to your scheduled procedure. Tardiness: Your scheduled time is also the cutoff time. If you do not arrive at least 15 minutes prior to your procedure, you will be rescheduled.  Children: Do not bring any children with you. Make arrangements to keep them home. Dress appropriately: There is always a possibility that your clothing may get soiled. Avoid long dresses. Valuables: Do not bring any jewelry or valuables.  Reasons to call and reschedule or cancel your procedure: (Following these recommendations will minimize the risk of a serious complication.) Surgeries: Avoid having procedures within 2 weeks of any surgery. (Avoid for 2 weeks before or after any surgery). Flu Shots: Avoid having procedures within 2 weeks of a flu shots or . (Avoid for 2 weeks before or after immunizations). Barium: Avoid having a procedure within 7-10 days after having had a radiological study involving the use of radiological contrast. (Myelograms, Barium swallow or enema study). Heart attacks: Avoid any elective procedures or surgeries for the initial 6 months after a "Myocardial Infarction" (Heart Attack). Blood thinners: It is imperative that you stop these medications before procedures. Let us know if you if you take any blood thinner.  Infection: Avoid procedures during or within two weeks of an infection (including chest colds or gastrointestinal problems). Symptoms associated with infections include: Localized redness, fever, chills, night sweats or profuse sweating, burning sensation when voiding, cough, congestion, stuffiness, runny nose, sore throat, diarrhea, nausea, vomiting, cold or Flu symptoms, recent or current infections. It is specially important if the infection is over the area that we intend to treat. Heart and lung problems:  Symptoms that may suggest an active cardiopulmonary problem include: cough, chest pain, breathing difficulties or shortness of breath, dizziness, ankle swelling, uncontrolled high or unusually low blood pressure, and/or palpitations. If you are experiencing any of these symptoms, cancel your procedure and contact your primary care physician for an evaluation.  Remember:  Regular Business hours are:  Monday to Thursday 8:00 AM to 4:00 PM  Provider's Schedule: Delano Metz, MD:  Procedure days: Tuesday and Thursday 7:30 AM to 4:00 PM  Edward Jolly, MD:  Procedure days: Monday and Wednesday 7:30 AM to 4:00 PM

## 2023-06-07 NOTE — Progress Notes (Signed)
 Subjective:   Darren Allen is a 58 y.o. who presents for a Medicare Wellness preventive visit.  Visit Complete: Virtual I connected with  Darren Allen on 06/07/23 by a audio enabled telemedicine application and verified that I am speaking with the correct person using two identifiers.  Patient Location: Home  Provider Location: Office/Clinic  I discussed the limitations of evaluation and management by telemedicine. The patient expressed understanding and agreed to proceed.  Vital Signs: Because this visit was a virtual/telehealth visit, some criteria may be missing or patient reported. Any vitals not documented were not able to be obtained and vitals that have been documented are patient reported.  VideoError- Librarian, academic were attempted between this provider and patient, however failed, due to patient having technical difficulties OR patient did not have access to video capability.  We continued and completed visit with audio only.   Persons Participating in Visit: Patient.  AWV Questionnaire: No: Patient Medicare AWV questionnaire was not completed prior to this visit.  Cardiac Risk Factors include: advanced age (>23men, >53 women);diabetes mellitus;dyslipidemia;hypertension;male gender     Objective:    Today's Vitals   06/07/23 1336  PainSc: 6    There is no height or weight on file to calculate BMI.     06/07/2023    1:44 PM 06/07/2023   11:02 AM 05/25/2023    8:24 AM 05/19/2023    1:55 PM 04/08/2023    8:10 AM 04/05/2023   12:09 PM 03/18/2023   10:44 AM  Advanced Directives  Does Patient Have a Medical Advance Directive? No No No No No No No  Would patient like information on creating a medical advance directive? No - Patient declined No - Patient declined No - Patient declined No - Patient declined No - Patient declined No - Patient declined No - Patient declined    Current Medications (verified) Outpatient Encounter Medications  as of 06/07/2023  Medication Sig   amitriptyline (ELAVIL) 10 MG tablet Take 30 mg by mouth at bedtime.   ascorbic acid (VITAMIN C) 500 MG tablet Take 500 mg by mouth daily.   Budeson-Glycopyrrol-Formoterol (BREZTRI AEROSPHERE) 160-9-4.8 MCG/ACT AERO Inhale 2 puffs into the lungs in the morning and at bedtime.   clotrimazole-betamethasone (LOTRISONE) cream Apply 1 Application topically daily.   cyanocobalamin (VITAMIN B12) 1000 MCG tablet Take 1,000 mcg by mouth daily.   dapagliflozin propanediol (FARXIGA) 10 MG TABS tablet Take 1 tablet (10 mg total) by mouth daily before breakfast.   ferrous sulfate 325 (65 FE) MG tablet Take 325 mg by mouth daily with breakfast.   fluticasone (FLONASE) 50 MCG/ACT nasal spray Place 1 spray into both nostrils daily as needed for allergies.   gabapentin (NEURONTIN) 600 MG tablet Take 600 mg by mouth 3 (three) times daily.   hydrochlorothiazide (HYDRODIURIL) 25 MG tablet Take 1 tablet (25 mg total) by mouth daily.   [START ON 06/21/2023] HYDROcodone-acetaminophen (NORCO/VICODIN) 5-325 MG tablet Take 1 tablet by mouth 2 (two) times daily as needed. Must last 30 days.   [START ON 07/21/2023] HYDROcodone-acetaminophen (NORCO/VICODIN) 5-325 MG tablet Take 1 tablet by mouth 2 (two) times daily as needed. Must last 30 days.   insulin isophane & regular human (HUMULIN 70/30 KWIKPEN) (70-30) 100 UNIT/ML KwikPen Inject 42-82 Units into the skin 2 (two) times daily with a meal. 82 in the a.m. and 42u qhs   ipratropium (ATROVENT) 0.06 % nasal spray Place 2 sprays into both nostrils 4 (four) times daily.  ipratropium-albuterol (DUONEB) 0.5-2.5 (3) MG/3ML SOLN Take 3 mLs by nebulization every 4 (four) hours as needed (SOB/Wheezing).   lisinopril (ZESTRIL) 10 MG tablet Take 1 tablet (10 mg total) by mouth daily.   Magnesium Oxide -Mg Supplement 500 MG TABS Take 500 mg by mouth daily.   metFORMIN (GLUCOPHAGE-XR) 500 MG 24 hr tablet TAKE 4 TABLETS BY MOUTH DAILY (Patient taking  differently: Take 1,000 mg by mouth 2 (two) times daily with a meal.)   metroNIDAZOLE (METROGEL) 1 % gel Apply topically daily. qhs to face   montelukast (SINGULAIR) 10 MG tablet Take 10 mg by mouth daily.   naloxone (NARCAN) nasal spray 4 mg/0.1 mL Place 1 spray into the nose as needed for up to 365 doses (for opioid-induced respiratory depresssion). In case of emergency (overdose), spray once into each nostril. If no response within 3 minutes, repeat application and call 911.   NEEDLE, DISP, 18 G (BD SAFETYGLIDE NEEDLE) 18G X 1-1/2" MISC 1 mg by Does not apply route every 7 (seven) days.   NEEDLE, DISP, 21 G (BD SAFETYGLIDE NEEDLE) 21G X 1" MISC 1 mg by Does not apply route every 7 (seven) days.   pantoprazole (PROTONIX) 40 MG tablet Take 40 mg by mouth 2 (two) times daily.   PRESCRIPTION MEDICATION Pt has CPAP Machine   PROAIR HFA 108 (90 Base) MCG/ACT inhaler Inhale 2 puffs into the lungs every 6 (six) hours as needed for wheezing or shortness of breath.   rosuvastatin (CRESTOR) 40 MG tablet TAKE 1 TABLET(40 MG) BY MOUTH DAILY   tazarotene (AVAGE) 0.1 % cream Apply topically at bedtime. qhs to face and chest for acne   terbinafine (LAMISIL) 250 MG tablet Take 1 tablet (250 mg total) by mouth daily.   testosterone cypionate (DEPOTESTOSTERONE CYPIONATE) 200 MG/ML injection Inject 1 cc every 10 days. (Patient taking differently: Inject 100 mg into the muscle See admin instructions. Inject 1 cc every 10 days.)   tirzepatide (MOUNJARO) 7.5 MG/0.5ML Pen Inject 7.5 mg into the skin once a week.   No facility-administered encounter medications on file as of 06/07/2023.    Allergies (verified) Glipizide, Cephalexin, Duloxetine, and Dupixent [dupilumab]   History: Past Medical History:  Diagnosis Date   Allergy    Asthma    Chronic lower back pain    a.) followed by pain management; on COT   COPD (chronic obstructive pulmonary disease) (HCC)    Coronary artery disease    a.) cCTA 06/30/2021:  Ca2+ = 216 (84th %ile; 25049% pRCA and pLAD))   DDD (degenerative disc disease), lumbosacral    Depression    Diabetic peripheral neuropathy (HCC)    Diastolic dysfunction 12/27/2020   a.) TTE 12/27/2020: EF >55%, no RWMAs, G1DD, norm RVSF, triv MR/TR?PR   Diverticulosis    Erectile dysfunction    a.) on PDE5i (tadalafil)   GERD (gastroesophageal reflux disease)    Hepatic steatosis    History of kidney stones    Hyperlipidemia    Hypertension    Hypogonadism male    a.) on exogenous TRT (depotestosterone cypionate)   IDA (iron deficiency anemia)    Long term current use of opiate analgesic    a.) followed by pain management; naloxone Rx available   OSA on CPAP    Paraesophageal hernia    a.) s/p robotic assisted repair 01/2022   PVD (peripheral vascular disease) (HCC)    T2DM (type 2 diabetes mellitus) (HCC)    Umbilical hernia    a.) s/p repair  01/20/2022   Ventral hernia    Past Surgical History:  Procedure Laterality Date   APPENDECTOMY     BRONCHOSCOPY  2016   CIRCUMCISION     COLONOSCOPY WITH PROPOFOL N/A 12/31/2021   Procedure: COLONOSCOPY WITH PROPOFOL;  Surgeon: Wyline Mood, MD;  Location: [facility] ENDOSCOPY;  Service: Gastroenterology;  Laterality: N/A;   ESOPHAGOGASTRODUODENOSCOPY N/A 12/31/2021   Procedure: ESOPHAGOGASTRODUODENOSCOPY (EGD);  Surgeon: Wyline Mood, MD;  Location: [facility] ENDOSCOPY;  Service: Gastroenterology;  Laterality: N/A;   INCISION AND DRAINAGE PERIRECTAL ABSCESS N/A 02/04/2015   Procedure: IRRIGATION AND DEBRIDEMENT PERIRECTAL ABSCESS;  Surgeon: Ida Rogue, MD;  Location: [facility] ORS;  Service: General;  Laterality: N/A;   INSERTION OF MESH  01/20/2022   Procedure: INSERTION OF MESH;  Surgeon: Leafy Ro, MD;  Location: [facility] ORS;  Service: General;;   INSERTION OF MESH N/A 04/08/2023   Procedure: INSERTION OF MESH;  Surgeon: Leafy Ro, MD;  Location: [facility] OR[facility];  Service: General;  Laterality: N/A;   KIDNEY STONE SURGERY Right     lung mass removal N/A    MUSCLE BIOPSY     ORIF ANKLE FRACTURE Left 03/23/2017   Procedure: OPEN REDUCTION INTERNAL FIXATION (ORIF) ANKLE FRACTURE;  Surgeon: Signa Kell, MD;  Location: [facility];  Service: Orthopedics;  Laterality: Left;   RECTAL EXAM UNDER ANESTHESIA  02/04/2015   Procedure: RECTAL EXAM UNDER ANESTHESIA;  Surgeon: Ida Rogue, MD;  Location: [facility];  Service: General;;   SYNDESMOSIS REPAIR Left 03/23/2017   Procedure: SYNDESMOSIS REPAIR;  Surgeon: Signa Kell, MD;  Location: [facility];  Service: Orthopedics;  Laterality: Left;   UMBILICAL HERNIA REPAIR  01/20/2022   Procedure: HERNIA REPAIR UMBILICAL ADULT;  Surgeon: Leafy Ro, MD;  Location: [facility];  Service: General;;   VASECTOMY     VENTRAL HERNIA REPAIR N/A 04/08/2023   Procedure: HERNIA REPAIR VENTRAL ADULT, open;  Surgeon: Leafy Ro, MD;  Location: [facility];  Service: General;  Laterality: N/A;   XI ROBOTIC ASSISTED PARAESOPHAGEAL HERNIA REPAIR N/A 01/20/2022   Procedure: XI ROBOTIC ASSISTED PARAESOPHAGEAL HERNIA REPAIR, CONVERTED TO OPEN, RNFA to assist;  Surgeon: Leafy Ro, MD;  Location: [facility];  Service: General;  Laterality: N/A;   Family History  Problem Relation Age of Onset   Cancer Mother 48       Lung   Cancer Father        Colon   Heart disease Father    Alcohol abuse Father    Cancer Brother 46       Esophageal   Diabetes Brother    Heart disease Brother    Social History   Socioeconomic History   Marital status: Single    Spouse name: Not on file   Number of children: Not on file   Years of education: Not on file   Highest education level: 8th grade  Occupational History   Not on file  Tobacco Use   Smoking status: Former    Current packs/day: 0.50    Average packs/day: 0.5 packs/day for 34.0 years (17.0 ttl pk-yrs)    Types: Cigarettes    Passive exposure: Past   Smokeless tobacco: Never   Tobacco comments:    patient using Nicoderm patches  Vaping  Use   Vaping status: Some Days   Substances: Nicotine  Substance and Sexual Activity   Alcohol use: Not Currently    Alcohol/week: 12.0 standard drinks of alcohol    Types: 12 Cans of beer per week  Comment: Quit February 2023   Drug use: Yes    Types: Hydrocodone   Sexual activity: Yes  Other Topics Concern   Not on file  Social History Narrative   Not on file   Social Drivers of Health   Financial Resource Strain: Low Risk  (06/07/2023)   Overall Financial Resource Strain (CARDIA)    Difficulty of Paying Living Expenses: Not hard at all  Food Insecurity: No Food Insecurity (06/07/2023)   Hunger Vital Sign    Worried About Running Out of Food in the Last Year: Never true    Ran Out of Food in the Last Year: Never true  Transportation Needs: No Transportation Needs (06/07/2023)   PRAPARE - Administrator, Civil Service (Medical): No    Lack of Transportation (Non-Medical): No  Physical Activity: Inactive (06/07/2023)   Exercise Vital Sign    Days of Exercise per Week: 0 days    Minutes of Exercise per Session: 0 min  Stress: No Stress Concern Present (06/07/2023)   [employer] of Occupational Health - Occupational Stress Questionnaire    Feeling of Stress : Only a little  Social Connections: Socially Isolated (06/07/2023)   Social Connection and Isolation Panel [NHANES]    Frequency of Communication with Friends and Family: Three times a week    Frequency of Social Gatherings with Friends and Family: Once a week    Attends Religious Services: Never    Database administrator or Organizations: No    Attends Engineer, structural: Never    Marital Status: Divorced    Tobacco Counseling Counseling given: Not Answered Tobacco comments: patient using Nicoderm patches    Clinical Intake:  Pre-visit preparation completed: Yes  Pain : 0-10 Pain Score: 6  Pain Type: Chronic pain Pain Location: Back Pain Orientation: Lower Pain Descriptors /  Indicators: Aching Pain Onset: More than a month ago Pain Frequency: Constant     Nutritional Risks: None Diabetes: Yes CBG done?: No CBG resulted in Enter/ Edit results?: No Did pt. bring in CBG monitor from home?: No  Lab Results  Component Value Date   HGBA1C 8.3 (H) 02/25/2023   HGBA1C 6.7 (H) 02/20/2022   HGBA1C 7.2 (H) 01/20/2022     How often do you need to have someone help you when you read instructions, pamphlets, or other written materials from your doctor or pharmacy?: 1 - Never  Interpreter Needed?: No  Information entered by :: NAllen LPN   Activities of Daily Living     06/07/2023    1:37 PM 04/08/2023    8:15 AM  In your present state of health, do you have any difficulty performing the following activities:  Hearing? 1 0  Comment decreased hearing left ear   Vision? 0 0  Difficulty concentrating or making decisions? 0 0  Walking or climbing stairs? 0   Dressing or bathing? 0   Doing errands, shopping? 0   Preparing Food and eating ? N   Using the Toilet? N   In the past six months, have you accidently leaked urine? N   Do you have problems with loss of bowel control? N   Managing your Medications? N   Managing your Finances? N   Housekeeping or managing your Housekeeping? N     Patient Care Team: Larae Grooms, NP as PCP - General (Nurse Practitioner) Delano Metz, MD as Consulting Physician (Pain Medicine) [facility], [facility]  Indicate any recent Medical Services you [phone]  have received from other than [facility] providers in the past year (date may be approximate).     Assessment:   This is a routine wellness examination for [place].  Hearing/Vision screen Hearing Screening - Comments:: Decreased hearing left ear no hearing aid Vision Screening - Comments:: Regular eye exams, Dr. Clydene Pugh   Goals Addressed             This Visit's Progress    Patient Stated       06/07/2023, wants to get through procedure coming up        Depression Screen     06/07/2023    1:46 PM 06/07/2023   11:02 AM 05/27/2023    1:12 PM 05/25/2023    8:23 AM 05/19/2023    1:55 PM 04/29/2023    1:58 PM 03/29/2023    3:21 PM  PHQ 2/9 Scores  PHQ - 2 Score 1 1 1 1 1  0 0  PHQ- 9 Score 1  2   0 0    Fall Risk     06/07/2023    1:45 PM 06/07/2023   11:02 AM 05/25/2023    8:23 AM 05/19/2023    1:55 PM 04/29/2023    1:58 PM  Fall Risk   Falls in the past year? 0 0 0 0 0  Number falls in past yr: 0 0 0  0  Injury with Fall? 0 0 0  0  Risk for fall due to : Medication side effect      Follow up Falls prevention discussed;Falls evaluation completed        MEDICARE RISK AT HOME:  Medicare Risk at Home Any stairs in or around the home?: Yes If so, are there any without handrails?: No Home free of loose throw rugs in walkways, pet beds, electrical cords, etc?: Yes Adequate lighting in your home to reduce risk of falls?: Yes Life alert?: No Use of a cane, walker or w/c?: Yes (has both cane and walker) Grab bars in the bathroom?: Yes Shower chair or bench in shower?: No Elevated toilet seat or a handicapped toilet?: No  TIMED UP AND GO:  Was the test performed?  No  Cognitive Function: 6CIT completed        06/07/2023    1:46 PM 06/01/2022    2:42 PM  6CIT Screen  What Year? 0 points 0 points  What month? 0 points 0 points  What time? 0 points 0 points  Count back from 20 0 points 0 points  Months in reverse 0 points 0 points  Repeat phrase 6 points 0 points  Total Score 6 points 0 points    Immunizations Immunization History  Administered Date(s) Administered   Influenza Inj Mdck Quad Pf 01/21/2021   Influenza,inj,Quad PF,6+ Mos 12/18/2014, 11/22/2015, 11/29/2017, 11/17/2018, 12/05/2019, 02/20/2022   Influenza-Unspecified 02/15/2012, 12/18/2014, 01/14/2017, 02/08/2023   Moderna Sars-Covid-2 Vaccination 11/20/2019, 12/22/2019   Pneumococcal Conjugate-13 08/21/2014   Pneumococcal Polysaccharide-23 10/15/2011   Smallpox  06/21/1969   Tdap 11/22/2015, 12/10/2016   Zoster Recombinant(Shingrix) 01/28/2021    Screening Tests Health Maintenance  Topic Date Due   COVID-19 Vaccine (3 - Moderna risk series) 01/19/2020   Zoster Vaccines- Shingrix (2 of 2) 03/25/2021   OPHTHALMOLOGY EXAM  12/23/2022   HEMOGLOBIN A1C  08/26/2023   Diabetic kidney evaluation - eGFR measurement  02/25/2024   Diabetic kidney evaluation - Urine ACR  02/25/2024   FOOT EXAM  05/26/2024   Medicare Annual Wellness (AWV)  06/06/2024   Colonoscopy  12/31/2024   DTaP/Tdap/Td (3 - Td or Tdap) 12/11/2026   Pneumococcal Vaccine 80-13 Years old (3 of 3 - PPSV23 or PCV20) 04/27/2028   INFLUENZA VACCINE  Completed   Hepatitis C Screening  Completed   HIV Screening  Completed   HPV VACCINES  Aged Out   Lung Cancer Screening  Discontinued    Health Maintenance  Health Maintenance Due  Topic Date Due   COVID-19 Vaccine (3 - Moderna risk series) 01/19/2020   Zoster Vaccines- Shingrix (2 of 2) 03/25/2021   OPHTHALMOLOGY EXAM  12/23/2022   Health Maintenance Items Addressed: Request eye exam. Due for second Shingrix  Additional Screening:  Vision Screening: Recommended annual ophthalmology exams for early detection of glaucoma and other disorders of the eye.  Dental Screening: Recommended annual dental exams for proper oral hygiene  Community Resource Referral / Chronic Care Management: CRR required this visit?  No   CCM required this visit?  No     Plan:     I have personally reviewed and noted the following in the patient's chart:   Medical and social history Use of alcohol, tobacco or illicit drugs  Current medications and supplements including opioid prescriptions. Patient is currently taking opioid prescriptions. Information provided to patient regarding non-opioid alternatives. Patient advised to discuss non-opioid treatment plan with their provider. Functional ability and status Nutritional status Physical  activity Advanced directives List of other physicians Hospitalizations, surgeries, and ER visits in previous 12 months Vitals Screenings to include cognitive, depression, and falls Referrals and appointments  In addition, I have reviewed and discussed with patient certain preventive protocols, quality metrics, and best practice recommendations. A written personalized care plan for preventive services as well as general preventive health recommendations were provided to patient.     Barb Merino, LPN   06/22/8117   After Visit Summary: (MyChart) Due to this being a telephonic visit, the after visit summary with patients personalized plan was offered to patient via MyChart   Notes: Nothing significant to report at this time.

## 2023-06-07 NOTE — Patient Instructions (Signed)
 Darren Allen , Thank you for taking time to come for your Medicare Wellness Visit. I appreciate your ongoing commitment to your health goals. Please review the following plan we discussed and let me know if I can assist you in the future.   Referrals/Orders/Follow-Ups/Clinician Recommendations: none  Managing Pain Without Opioids Opioids are strong medicines used to treat moderate to severe pain. For some people, especially those who have long-term (chronic) pain, opioids may not be the best choice for pain management due to: Side effects like nausea, constipation, and sleepiness. The risk of addiction (opioid use disorder). The longer you take opioids, the greater your risk of addiction. Pain that lasts for more than 3 months is called chronic pain. Managing chronic pain usually requires more than one approach and is often provided by a team of health care providers working together (multidisciplinary approach). Pain management may be done at a pain management center or pain clinic. How to manage pain without the use of opioids Use non-opioid medicines Non-opioid medicines for pain may include: Over-the-counter or prescription non-steroidal anti-inflammatory drugs (NSAIDs). These may be the first medicines used for pain. They work well for muscle and bone pain, and they reduce swelling. Acetaminophen. This over-the-counter medicine may work well for milder pain but not swelling. Antidepressants. These may be used to treat chronic pain. A certain type of antidepressant (tricyclics) is often used. These medicines are given in lower doses for pain than when used for depression. Anticonvulsants. These are usually used to treat seizures but may also reduce nerve (neuropathic) pain. Muscle relaxants. These relieve pain caused by sudden muscle tightening (spasms). You may also use a pain medicine that is applied to the skin as a patch, cream, or gel (topical analgesic), such as a numbing medicine. These  may cause fewer side effects than medicines taken by mouth. Do certain therapies as directed Some therapies can help with pain management. They include: Physical therapy. You will do exercises to gain strength and flexibility. A physical therapist may teach you exercises to move and stretch parts of your body that are weak, stiff, or painful. You can learn these exercises at physical therapy visits and practice them at home. Physical therapy may also involve: Massage. Heat wraps or applying heat or cold to affected areas. Electrical signals that interrupt pain signals (transcutaneous electrical nerve stimulation, TENS). Weak lasers that reduce pain and swelling (low-level laser therapy). Signals from your body that help you learn to regulate pain (biofeedback). Occupational therapy. This helps you to learn ways to function at home and work with less pain. Recreational therapy. This involves trying new activities or hobbies, such as a physical activity or drawing. Mental health therapy, including: Cognitive behavioral therapy (CBT). This helps you learn coping skills for dealing with pain. Acceptance and commitment therapy (ACT) to change the way you think and react to pain. Relaxation therapies, including muscle relaxation exercises and mindfulness-based stress reduction. Pain management counseling. This may be individual, family, or group counseling.  Receive medical treatments Medical treatments for pain management include: Nerve block injections. These may include a pain blocker and anti-inflammatory medicines. You may have injections: Near the spine to relieve chronic back or neck pain. Into joints to relieve back or joint pain. Into nerve areas that supply a painful area to relieve body pain. Into muscles (trigger point injections) to relieve some painful muscle conditions. A medical device placed near your spine to help block pain signals and relieve nerve pain or chronic back pain  (spinal  cord stimulation device). Acupuncture. Follow these instructions at home Medicines Take over-the-counter and prescription medicines only as told by your health care provider. If you are taking pain medicine, ask your health care providers about possible side effects to watch out for. Do not drive or use heavy machinery while taking prescription opioid pain medicine. Lifestyle  Do not use drugs or alcohol to reduce pain. If you drink alcohol, limit how much you have to: 0-1 drink a day for women who are not pregnant. 0-2 drinks a day for men. Know how much alcohol is in a drink. In the [place] one drink equals one 12 oz bottle of beer (355 mL), one 5 oz glass of wine (148 mL), or one 1 oz glass of hard liquor (44 mL). Do not use any products that contain nicotine or tobacco. These products include cigarettes, chewing tobacco, and vaping devices, such as e-cigarettes. If you need help quitting, ask your health care provider. Eat a healthy diet and maintain a healthy weight. Poor diet and excess weight may make pain worse. Eat foods that are high in fiber. These include fresh fruits and vegetables, whole grains, and beans. Limit foods that are high in fat and processed sugars, such as fried and sweet foods. Exercise regularly. Exercise lowers stress and may help relieve pain. Ask your health care provider what activities and exercises are safe for you. If your health care provider approves, join an exercise class that combines movement and stress reduction. Examples include yoga and tai chi. Get enough sleep. Lack of sleep may make pain worse. Lower stress as much as possible. Practice stress reduction techniques as told by your therapist. General instructions Work with all your pain management providers to find the treatments that work best for you. You are an important member of your pain management team. There are many things you can do to reduce pain on your own. Consider joining an  online or in-person support group for people who have chronic pain. Keep all follow-up visits. This is important. Where to find more information You can find more information about managing pain without opioids from: American Academy of Pain Medicine: painmed.org Institute for Chronic Pain: instituteforchronicpain.org American Chronic Pain Association: theacpa.org Contact a health care provider if: You have side effects from pain medicine. Your pain gets worse or does not get better with treatments or home therapy. You are struggling with anxiety or depression. Summary Many types of pain can be managed without opioids. Chronic pain may respond better to pain management without opioids. Pain is best managed when you and a team of health care providers work together. Pain management without opioids may include non-opioid medicines, medical treatments, physical therapy, mental health therapy, and lifestyle changes. Tell your health care providers if your pain gets worse or is not being managed well enough. This information is not intended to replace advice given to you by your health care provider. Make sure you discuss any questions you have with your health care provider. Document Revised: 06/12/2020 Document Reviewed: 06/12/2020 Elsevier Patient Education  2024 [employer]  This is a list of the screening recommended for you and due dates:  Health Maintenance  Topic Date Due   COVID-19 Vaccine (3 - Moderna risk series) 01/19/2020   Zoster (Shingles) Vaccine (2 of 2) 03/25/2021   Eye exam for diabetics  12/23/2022   Hemoglobin A1C  08/26/2023   Yearly kidney function blood test for diabetes  02/25/2024   Yearly kidney health urinalysis for diabetes  02/25/2024   Complete foot exam   05/26/2024   Medicare Annual Wellness Visit  06/06/2024   Colon Cancer Screening  12/31/2024   DTaP/Tdap/Td vaccine (3 - Td or Tdap) 12/11/2026   Pneumococcal Vaccination (3 of 3 - PPSV23 or PCV20)  04/27/2028   Flu Shot  Completed   Hepatitis C Screening  Completed   HIV Screening  Completed   HPV Vaccine  Aged Out   Screening for Lung Cancer  Discontinued    Advanced directives: (Declined) Advance directive discussed with you today. Even though you declined this today, please call our office should you change your mind, and we can give you the proper paperwork for you to fill out.  Next Medicare Annual Wellness Visit scheduled for next year: Yes  insert Preventive Care attachment Insert FALL PREVENTION attachment if needed

## 2023-06-07 NOTE — Progress Notes (Signed)
 Safety precautions to be maintained throughout the outpatient stay will include: orient to surroundings, keep bed in low position, maintain call bell within reach at all times, provide assistance with transfer out of bed and ambulation.

## 2023-06-08 ENCOUNTER — Ambulatory Visit: Admitting: Pain Medicine

## 2023-06-10 ENCOUNTER — Other Ambulatory Visit: Payer: Self-pay | Admitting: Student

## 2023-06-10 DIAGNOSIS — R0602 Shortness of breath: Secondary | ICD-10-CM

## 2023-06-10 DIAGNOSIS — R931 Abnormal findings on diagnostic imaging of heart and coronary circulation: Secondary | ICD-10-CM

## 2023-06-15 ENCOUNTER — Ambulatory Visit: Admitting: Pain Medicine

## 2023-06-18 ENCOUNTER — Telehealth (HOSPITAL_COMMUNITY): Payer: Self-pay | Admitting: *Deleted

## 2023-06-18 NOTE — Telephone Encounter (Signed)
 Reaching out to patient to offer assistance regarding upcoming cardiac imaging study; pt verbalizes understanding of appt date/time, parking situation and where to check in, pre-test NPO status and medications ordered, and verified current allergies; name and call back number provided for further questions should they arise Darren Frame RN Navigator Cardiac Imaging [facility] [phone] office [phone] cell

## 2023-06-21 ENCOUNTER — Ambulatory Visit
Admission: RE | Admit: 2023-06-21 | Discharge: 2023-06-21 | Disposition: A | Discharge status: Home / Self Care | Source: Ambulatory Visit | Attending: Student | Admitting: Student

## 2023-06-21 DIAGNOSIS — R931 Abnormal findings on diagnostic imaging of heart and coronary circulation: Secondary | ICD-10-CM

## 2023-06-21 DIAGNOSIS — R0602 Shortness of breath: Secondary | ICD-10-CM

## 2023-06-21 MED ORDER — IOHEXOL 350 MG/ML SOLN: 80.0000 mL | Freq: Once | INTRAVENOUS | Status: DC | PRN

## 2023-06-21 MED ORDER — METOPROLOL TARTRATE 5 MG/5ML IV SOLN
INTRAVENOUS | Status: AC
  Filled 2023-06-21: qty 5

## 2023-06-21 MED ORDER — NITROGLYCERIN 0.4 MG SL SUBL
SUBLINGUAL_TABLET | SUBLINGUAL | Status: AC
  Filled 2023-06-21: qty 1

## 2023-06-21 NOTE — Patient Instructions (Incomplete)

## 2023-06-21 NOTE — Progress Notes (Unsigned)
 PROVIDER NOTE: Interpretation of information contained herein should be left to medically-trained personnel. Specific patient instructions are provided elsewhere under "Patient Instructions" section of medical record. This document was created in part using STT-dictation technology, any transcriptional errors that may result from this process are unintentional.  Patient: Darren Allen Type: Established DOB: Jan 21, 1966 MRN: [id number] PCP: Larae Grooms, NP  Service: Procedure DOS: 06/22/2023 Setting: Ambulatory Location: Ambulatory outpatient facility Delivery: Face-to-face Provider: Oswaldo Done, MD Specialty: Interventional Pain Management Specialty designation: 09 Location: Outpatient facility Ref. Prov.: Larae Grooms, NP       Interventional Therapy   Procedure: Lumbar Facet, Medial Branch Radiofrequency Ablation (RFA) #1 (w/o Steroids) Laterality: Left (-LT)  Level: L2, L3, L4, L5, and S1 Medial Branch Level(s). These levels will denervate the L3-4, L4-5, and L5-S1 lumbar facet joints.  Imaging: Fluoroscopy-guided         Anesthesia: Local anesthesia (1-2% Lidocaine) Anxiolysis: IV Versed 2.0 mg Sedation: Moderate Sedation Fentanyl 1 mL (50 mcg) DOS: 06/22/2023  Performed by: Oswaldo Done, MD  Purpose: Therapeutic/Palliative Indications: Low back pain severe enough to impact quality of life or function. Indications: 1. Chronic low back pain (1ry area of Pain) (Bilateral) (R>L) w/o sciatica   2. DDD (degenerative disc disease), lumbosacral w/ LBP   3. Lumbar facet joint pain   4. Lumbar facet syndrome   5. Spondylosis without myelopathy or radiculopathy, lumbosacral region   6. Abnormal MRI, lumbar spine (03/11/2020)    [name] has been dealing with the above chronic pain for longer than three months and has either failed to respond, was unable to tolerate, or simply did not get enough benefit from other more conservative therapies including, but not  limited to: 1. Over-the-counter medications 2. Anti-inflammatory medications 3. Muscle relaxants 4. Membrane stabilizers 5. Opioids 6. Physical therapy and/or chiropractic manipulation 7. Modalities (Heat, ice, etc.) 8. Invasive techniques such as nerve blocks. [name] has attained more than 50% relief of the pain from a series of diagnostic injections conducted in separate occasions.  Pain Score: Pre-procedure: 4 /10 Post-procedure: 0-No pain/10     Position / Prep / Materials:  Position: Prone  Prep solution: ChloraPrep (2% chlorhexidine gluconate and 70% isopropyl alcohol) Prep Area: Entire Lumbosacral Region (Lower back from mid-thoracic region to end of tailbone and from flank to flank.) Materials:  Tray: RFA (Radiofrequency) tray Needle(s):  Type: RFA (Teflon-coated radiofrequency ablation needles) Gauge (G): 22  Length: Regular (10cm) Qty: 5     H&P (Pre-op Assessment):  [name] is a 58 y.o. (year old), male patient, seen today for interventional treatment. He  has a past surgical history that includes Appendectomy; Incision and drainage perirectal abscess (N/A, 02/04/2015); Rectal exam under anesthesia (02/04/2015); ORIF ankle fracture (Left, 03/23/2017); Syndesmosis repair (Left, 03/23/2017); Colonoscopy with propofol (N/A, 12/31/2021); Esophagogastroduodenoscopy (N/A, 12/31/2021); Kidney stone surgery (Right); lung mass removal (N/A); Xi robotic assisted paraesophageal hernia repair (N/A, 01/20/2022); Insertion of mesh (01/20/2022); Umbilical hernia repair (01/20/2022); Vasectomy; Circumcision; Bronchoscopy (2016); Muscle biopsy; Ventral hernia repair (N/A, 04/08/2023); and Insertion of mesh (N/A, 04/08/2023). [name] has a current medication list which includes the following prescription(s): amitriptyline, ascorbic acid, breztri aerosphere, clotrimazole-betamethasone, cyanocobalamin, dapagliflozin propanediol, ferrous sulfate, fluticasone, gabapentin,  hydrochlorothiazide, hydrocodone-acetaminophen, [START ON 07/21/2023] hydrocodone-acetaminophen, humulin 70/30 kwikpen, ipratropium, ipratropium-albuterol, lisinopril, magnesium oxide -mg supplement, metformin, metoprolol tartrate, metronidazole, montelukast, naloxone, bd safetyglide needle, bd safetyglide needle, oxycodone-acetaminophen, [START ON 06/29/2023] oxycodone-acetaminophen, pantoprazole, PRESCRIPTION MEDICATION, proair hfa, rosuvastatin, tazarotene, terbinafine, testosterone cypionate, and mounjaro, and the following  Facility-Administered Medications: fentanyl. His primarily concern today is the Back Pain (Left, lower)  Initial Vital Signs:  Pulse/HCG Rate: 82ECG Heart Rate: 79 (nsr) Temp: 97.8 F (36.6 C) Resp: 16 BP: 132/79 SpO2: 100 %  BMI: Estimated body mass index is 31.71 kg/m as calculated from the following:   Height as of this encounter: 6\' 2"  (1.88 m).   Weight as of this encounter: 247 lb (112 kg).  Risk Assessment: Allergies: Reviewed. He is allergic to glipizide, cephalexin, duloxetine, and dupixent [dupilumab].  Allergy Precautions: None required Coagulopathies: Reviewed. None identified.  Blood-thinner therapy: None at this time Active Infection(s): Reviewed. None identified. [name] is afebrile  Site Confirmation: [name] was asked to confirm the procedure and laterality before marking the site Procedure checklist: Completed Consent: Before the procedure and under the influence of no sedative(s), amnesic(s), or anxiolytics, the patient was informed of the treatment options, risks and possible complications. To fulfill our ethical and legal obligations, as recommended by the American Medical Association's Code of Ethics, I have informed the patient of my clinical impression; the nature and purpose of the treatment or procedure; the risks, benefits, and possible complications of the intervention; the alternatives, including doing nothing; the risk(s) and benefit(s)  of the alternative treatment(s) or procedure(s); and the risk(s) and benefit(s) of doing nothing. The patient was provided information about the general risks and possible complications associated with the procedure. These may include, but are not limited to: failure to achieve desired goals, infection, bleeding, organ or nerve damage, allergic reactions, paralysis, and death. In addition, the patient was informed of those risks and complications associated to Spine-related procedures, such as failure to decrease pain; infection (i.e.: Meningitis, epidural or intraspinal abscess); bleeding (i.e.: epidural hematoma, subarachnoid hemorrhage, or any other type of intraspinal or peri-dural bleeding); organ or nerve damage (i.e.: Any type of peripheral nerve, nerve root, or spinal cord injury) with subsequent damage to sensory, motor, and/or autonomic systems, resulting in permanent pain, numbness, and/or weakness of one or several areas of the body; allergic reactions; (i.e.: anaphylactic reaction); and/or death. Furthermore, the patient was informed of those risks and complications associated with the medications. These include, but are not limited to: allergic reactions (i.e.: anaphylactic or anaphylactoid reaction(s)); adrenal axis suppression; blood sugar elevation that in diabetics may result in ketoacidosis or comma; water retention that in patients with history of congestive heart failure may result in shortness of breath, pulmonary edema, and decompensation with resultant heart failure; weight gain; swelling or edema; medication-induced neural toxicity; particulate matter embolism and blood vessel occlusion with resultant organ, and/or nervous system infarction; and/or aseptic necrosis of one or more joints. Finally, the patient was informed that Medicine is not an exact science; therefore, there is also the possibility of unforeseen or unpredictable risks and/or possible complications that may result in a  catastrophic outcome. The patient indicated having understood very clearly. We have given the patient no guarantees and we have made no promises. Enough time was given to the patient to ask questions, all of which were answered to the patient's satisfaction. Mr. Troiano has indicated that he wanted to continue with the procedure. Attestation: I, the ordering provider, attest that I have discussed with the patient the benefits, risks, side-effects, alternatives, likelihood of achieving goals, and potential problems during recovery for the procedure that I have provided informed consent. Date  Time: 06/22/2023  9:43 AM  Pre-Procedure Preparation:  Monitoring: As per clinic protocol. Respiration, ETCO2, SpO2, BP, heart rate and rhythm  monitor placed and checked for adequate function Safety Precautions: Patient was assessed for positional comfort and pressure points before starting the procedure. Time-out: I initiated and conducted the "Time-out" before starting the procedure, as per protocol. The patient was asked to participate by confirming the accuracy of the "Time Out" information. Verification of the correct person, site, and procedure were performed and confirmed by me, the nursing staff, and the patient. "Time-out" conducted as per Joint Commission's Universal Protocol (UP.01.01.01). Time: 1056 Start Time: 1056 hrs.  Description of Procedure:          Laterality: Darren Allen above. Levels:  Darren Allen above. Safety Precautions: Aspiration looking for blood return was conducted prior to all injections. At no point did we inject any substances, as a needle was being advanced. Before injecting, the patient was told to immediately notify me if he was experiencing any new onset of "ringing in the ears, or metallic taste in the mouth". No attempts were made at seeking any paresthesias. Safe injection practices and needle disposal techniques used. Medications properly checked for expiration dates. SDV (single dose vial)  medications used. After the completion of the procedure, all disposable equipment used was discarded in the proper designated medical waste containers. Local Anesthesia: Protocol guidelines were followed. The patient was positioned over the fluoroscopy table. The area was prepped in the usual manner. The time-out was completed. The target area was identified using fluoroscopy. A 12-in long, straight, sterile hemostat was used with fluoroscopic guidance to locate the targets for each level blocked. Once located, the skin was marked with an approved surgical skin marker. Once all sites were marked, the skin (epidermis, dermis, and hypodermis), as well as deeper tissues (fat, connective tissue and muscle) were infiltrated with a small amount of a short-acting local anesthetic, loaded on a 10cc syringe with a 25G, 1.5-in  Needle. An appropriate amount of time was allowed for local anesthetics to take effect before proceeding to the next step. Technical description of process:  Radiofrequency Ablation (RFA) L2 Medial Branch Nerve RFA: The target area for the L2 medial branch is at the junction of the postero-lateral aspect of the superior articular process and the superior, posterior, and medial edge of the transverse process of L3. Under fluoroscopic guidance, a Radiofrequency needle was inserted until contact was made with os over the superior postero-lateral aspect of the pedicular shadow (target area). Sensory and motor testing was conducted to properly adjust the position of the needle. Once satisfactory placement of the needle was achieved, the numbing solution was slowly injected after negative aspiration for blood. 2.0 mL of the nerve block solution was injected without difficulty or complication. After waiting for at least 3 minutes, the ablation was performed. Once completed, the needle was removed intact. L3 Medial Branch Nerve RFA: The target area for the L3 medial branch is at the junction of the  postero-lateral aspect of the superior articular process and the superior, posterior, and medial edge of the transverse process of L4. Under fluoroscopic guidance, a Radiofrequency needle was inserted until contact was made with os over the superior postero-lateral aspect of the pedicular shadow (target area). Sensory and motor testing was conducted to properly adjust the position of the needle. Once satisfactory placement of the needle was achieved, the numbing solution was slowly injected after negative aspiration for blood. 2.0 mL of the nerve block solution was injected without difficulty or complication. After waiting for at least 3 minutes, the ablation was performed. Once completed, the needle was removed intact.  L4 Medial Branch Nerve RFA: The target area for the L4 medial branch is at the junction of the postero-lateral aspect of the superior articular process and the superior, posterior, and medial edge of the transverse process of L5. Under fluoroscopic guidance, a Radiofrequency needle was inserted until contact was made with os over the superior postero-lateral aspect of the pedicular shadow (target area). Sensory and motor testing was conducted to properly adjust the position of the needle. Once satisfactory placement of the needle was achieved, the numbing solution was slowly injected after negative aspiration for blood. 2.0 mL of the nerve block solution was injected without difficulty or complication. After waiting for at least 3 minutes, the ablation was performed. Once completed, the needle was removed intact. L5 Medial Branch Nerve RFA: The target area for the L5 medial branch is at the junction of the postero-lateral aspect of the superior articular process of S1 and the superior, posterior, and medial edge of the sacral ala. Under fluoroscopic guidance, a Radiofrequency needle was inserted until contact was made with os over the superior postero-lateral aspect of the pedicular shadow (target  area). Sensory and motor testing was conducted to properly adjust the position of the needle. Once satisfactory placement of the needle was achieved, the numbing solution was slowly injected after negative aspiration for blood. 2.0 mL of the nerve block solution was injected without difficulty or complication. After waiting for at least 3 minutes, the ablation was performed. Once completed, the needle was removed intact. S1 Medial Branch Nerve RFA: The target area for the S1 medial branch is located inferior to the junction of the S1 superior articular process and the L5 inferior articular process, posterior, inferior, and lateral to the 6 o'clock position of the L5-S1 facet joint, just superior to the S1 posterior foramen. Under fluoroscopic guidance, the Radiofrequency needle was advanced until contact was made with os over the Target area. Sensory and motor testing was conducted to properly adjust the position of the needle. Once satisfactory placement of the needle was achieved, the numbing solution was slowly injected after negative aspiration for blood. 2.0 mL of the nerve block solution was injected without difficulty or complication. After waiting for at least 3 minutes, the ablation was performed. Once completed, the needle was removed intact. Radiofrequency lesioning (ablation):  Radiofrequency Generator: Medtronic AccurianTM AG 1000 RF Generator Sensory Stimulation Parameters: 50 Hz was used to locate & identify the nerve, making sure that the needle was positioned such that there was no sensory stimulation below 0.3 V or above 0.7 V. Motor Stimulation Parameters: 2 Hz was used to evaluate the motor component. Care was taken not to lesion any nerves that demonstrated motor stimulation of the lower extremities at an output of less than 2.5 times that of the sensory threshold, or a maximum of 2.0 V. Lesioning Technique Parameters: Standard Radiofrequency settings. (Not bipolar or pulsed.) Temperature  Settings: 80 degrees C Lesioning time: 60 seconds Stationary intra-operative compliance: Compliant  Once the entire procedure was completed, the treated area was cleaned, making sure to leave some of the prepping solution back to take advantage of its long term bactericidal properties.    Illustration of the posterior view of the lumbar spine and the posterior neural structures. Laminae of L2 through S1 are labeled. DPRL5, dorsal primary ramus of L5; DPRS1, dorsal primary ramus of S1; DPR3, dorsal primary ramus of L3; FJ, facet (zygapophyseal) joint L3-L4; I, inferior articular process of L4; LB1, lateral branch of dorsal primary ramus of  L1; IAB, inferior articular branches from L3 medial branch (supplies L4-L5 facet joint); IBP, intermediate branch plexus; MB3, medial branch of dorsal primary ramus of L3; NR3, third lumbar nerve root; S, superior articular process of L5; SAB, superior articular branches from L4 (supplies L4-5 facet joint also); TP3, transverse process of L3.  Facet Joint Innervation (* possible contribution)  L1-2 T12, L1 (L2*)  Medial Branch  L2-3 L1, L2 (L3*)         "          "  L3-4 L2, L3 (L4*)         "          "  L4-5 L3, L4 (L5*)         "          "  L5-S1 L4, L5, S1          "          "    Vitals:   06/22/23 1125 06/22/23 1135 06/22/23 1145 06/22/23 1155  BP: (!) 140/89 117/69 128/83 115/80  Pulse:      Resp: 15 12 11 14   Temp:      TempSrc:      SpO2: 100% 100% 98% 100%  Weight:      Height:        Start Time: 1056 hrs. End Time: 1125 hrs.  Imaging Guidance (Spinal):          Type of Imaging Technique: Fluoroscopy Guidance (Spinal) Indication(s): Fluoroscopy guidance for needle placement to enhance accuracy in procedures requiring precise needle localization for targeted delivery of medication in or near specific anatomical locations not easily accessible without such real-time imaging assistance. Exposure Time: Please Darren Allen nurses notes. Contrast:  None used. Fluoroscopic Guidance: I was personally present during the use of fluoroscopy. "Tunnel Vision Technique" used to obtain the best possible view of the target area. Parallax error corrected before commencing the procedure. "Direction-depth-direction" technique used to introduce the needle under continuous pulsed fluoroscopy. Once target was reached, antero-posterior, oblique, and lateral fluoroscopic projection used confirm needle placement in all planes. Images permanently stored in EMR. Interpretation: No contrast injected. I personally interpreted the imaging intraoperatively. Adequate needle placement confirmed in multiple planes. Permanent images saved into the patient's record.  Antibiotic Prophylaxis:   Anti-infectives (From admission, onward)    None      Indication(s): None identified  Post-operative Assessment:  Post-procedure Vital Signs:  Pulse/HCG Rate: 8278 Temp: 97.8 F (36.6 C) Resp: 14 BP: 115/80 SpO2: 100 %  EBL: None  Complications: No immediate post-treatment complications observed by team, or reported by patient.  Note: The patient tolerated the entire procedure well. A repeat set of vitals were taken after the procedure and the patient was kept under observation following institutional policy, for this type of procedure. Post-procedural neurological assessment was performed, showing return to baseline, prior to discharge. The patient was provided with post-procedure discharge instructions, including a section on how to identify potential problems. Should any problems arise concerning this procedure, the patient was given instructions to immediately contact us, at any time, without hesitation. In any case, we plan to contact the patient by telephone for a follow-up status report regarding this interventional procedure.  Comments:  No additional relevant information.  Plan of Care (POC)  Orders:  Orders Placed This Encounter  Procedures    Radiofrequency,Lumbar    Scheduling Instructions:     Side(s): Left-sided     Level: L3-4, L4-5, and  L5-S1 Facets (L2, L3, L4, L5, and S1 Medial Branch)     Sedation: With Sedation.     Timeframe: Today    Where will this procedure be performed?:   [facility]   DG [facility] C-ARM 1-60 MIN NO REPORT    Intraoperative interpretation by procedural physician at [facility].    Standing Status:   Standing    Number of Occurrences:   1    Reason for exam::   Assistance in needle guidance and placement for procedures requiring needle placement in or near specific anatomical locations not easily accessible without such assistance.   Informed Consent Details: Physician/Practitioner Attestation; Transcribe to consent form and obtain patient signature    Nursing Order: Transcribe to consent form and obtain patient signature. Note: Always confirm laterality of pain with Mr. Eyerman, before procedure.    Physician/Practitioner attestation of informed consent for procedure/surgical case:   I, the physician/practitioner, attest that I have discussed with the patient the benefits, risks, side effects, alternatives, likelihood of achieving goals and potential problems during recovery for the procedure that I have provided informed consent.    Procedure:   Lumbar Facet Radiofrequency Ablation    Physician/Practitioner performing the procedure:   Cheri Ayotte A. Laban Emperor, MD    Indication/Reason:   Low Back Pain, with our without leg pain, due to Facet Joint Arthralgia (Joint Pain) known as Lumbar Facet Syndrome, secondary to Lumbar, and/or Lumbosacral Spondylosis (Arthritis of the Spine), without myelopathy or radiculopathy (Nerve Damage).   Provide equipment / supplies at bedside    Procedure tray: "Radiofrequency Tray" Additional material: Large hemostat (x1); Small hemostat (x1); Towels (x8); 4x4 sterile sponge pack (x1) Needle type: Teflon-coated Radiofrequency Needle (Disposable  single  use) Size: Regular Quantity: 5    Standing Status:   Standing    Number of Occurrences:   1    Specify:   Radiofrequency Tray   Saline lock IV    Have LR [phone] mL available and administer at 125 mL/hr if patient becomes hypotensive.    Standing Status:   Standing    Number of Occurrences:   1   Chronic Opioid Analgesic:   Hydrocodone/APAP 5/325 tablet, 1 tab p.o. twice daily (#60) MME/day: 10 mg/day   Medications ordered for procedure: Meds ordered this encounter  Medications   lidocaine (XYLOCAINE) 2 % (with pres) injection 400 mg   pentafluoroprop-tetrafluoroeth (GEBAUERS) aerosol   midazolam (VERSED) 5 MG/5ML injection 0.5-2 mg    Make sure Flumazenil is available in the pyxis when using this medication. If oversedation occurs, administer 0.2 mg IV over 15 sec. If after 45 sec no response, administer 0.2 mg again over 1 min; may repeat at 1 min intervals; not to exceed 4 doses (1 mg)   fentaNYL (SUBLIMAZE) injection 25-50 mcg    Make sure Narcan is available in the pyxis when using this medication. In the event of respiratory depression (RR< 8/min): Titrate NARCAN (naloxone) in increments of 0.1 to 0.2 mg IV at 2-3 minute intervals, until desired degree of reversal.   ropivacaine (PF) 2 mg/mL (0.2%) (NAROPIN) injection 9 mL   triamcinolone acetonide (KENALOG-40) injection 40 mg NOT GIVEN   oxyCODONE-acetaminophen (PERCOCET) 5-325 MG tablet    Sig: Take 1 tablet by mouth every 8 (eight) hours as needed for up to 7 days for severe pain (pain score 7-10). Must last 7 days.    Dispense:  21 tablet    Refill:  0    For acute  post-operative pain. Not to be refilled.  Must last 7 days.   oxyCODONE-acetaminophen (PERCOCET) 5-325 MG tablet    Sig: Take 1 tablet by mouth every 8 (eight) hours as needed for up to 7 days for severe pain (pain score 7-10). Must last 7 days.    Dispense:  21 tablet    Refill:  0    For acute post-operative pain. Not to be refilled.  Must last 7 days.    Medications administered: We administered lidocaine, pentafluoroprop-tetrafluoroeth, midazolam, fentaNYL, ropivacaine (PF) 2 mg/mL (0.2%), and triamcinolone acetonide.  Darren Allen the medical record for exact dosing, route, and time of administration.  Follow-up plan:   Return in about 2 weeks (around 07/06/2023) for ( ), (ECT): (R) L-FCT RFA #1.       Interventional Therapies  Risk Factors  Considerations:   WNL   Planned  Pending:   Physical therapy for the lower back ordered.  (06/07/2023-referral entered)  Therapeutic bilateral lumbar facet RFA #1 (starting with the left).    Under consideration:   Therapeutic bilateral lumbar facet RFA #1 (starting with the left side) Therapeutic right L5-S1 LESI #2  Possible spinal cord stimulator trial  Therapeutic left L5-S1 percutaneous discectomy with "[employer]" system    Completed:   Diagnostic bilateral lumbar facet MBB x1 (03/02/2023) (100/100/95/100) (w/ steroids)  Diagnostic bilateral lumbar facet MBB x1 (03/02/2023) (80-100/80-100/0/0) (w/o steroids)  Diagnostic midline to left caudal ESI x1 (04/02/2022) (100/100/100/LBP:100/LEP:100)  Therapeutic left L5-S1 LESI x3 (08/27/2022) (1st:100/100/100/LBP:85  LEP:100) (2nd: 01/13/59/LBP:60LLEP:100)  Therapeutic right L5-S1 LESI x1 (12/24/2022) (100/85/100/LBP:100)  Therapeutic bilateral Qutenza neurolytic treatment x1 (12/23/2021)  (09/08/2021 & 10/29/2021) referral to physical therapy for evaluation and treatment of low back pain.   Completed by other providers:   EMG/PNCV of lower extremity (02/20/2020) by Dr. Cristopher Peru (generalized sensorimotor peripheral neuropathy; superimposed left S1 radiculopathy)   Therapeutic  Palliative (PRN) options:   Diagnostic bilateral lumbar facet MBB #2 (without steroids) (PRN)     Recent Visits Date Type Provider Dept  06/07/23 Office Visit Delano Metz, MD [facility]  05/25/23 Procedure visit Delano Metz, MD  [facility]  05/19/23 Office Visit Delano Metz, MD [facility]  Showing recent visits within past 90 days and meeting all other requirements Today's Visits Date Type Provider Dept  06/22/23 Procedure visit Delano Metz, MD [facility]  Showing today's visits and meeting all other requirements Future Appointments Date Type Provider Dept  07/06/23 Appointment Delano Metz, MD [facility]  08/16/23 Appointment Delano Metz, MD [facility]  Showing future appointments within next 90 days and meeting all other requirements  Disposition: Discharge home  Discharge (Date  Time): 06/22/2023; 1200 hrs.   Primary Care Physician: Larae Grooms, NP Location: [facility] Note by: Oswaldo Done, MD (TTS technology used. I apologize for any typographical errors that were not detected and corrected.) Date: 06/22/2023; Time: 12:01 PM  Disclaimer:  Medicine is not an Visual merchandiser. The only guarantee in medicine is that nothing is guaranteed. It is important to note that the decision to proceed with this intervention was based on the information collected from the patient. The Data and conclusions were drawn from the patient's questionnaire, the interview, and the physical examination. Because the information was provided in large part by the patient, it cannot be guaranteed that it has not been purposely or unconsciously manipulated. Every effort has been made to obtain as much relevant data as possible for this evaluation.  It is important to note that the conclusions that lead to this procedure are derived in large part from the available data. Always take into account that the treatment will also be dependent on availability of resources and existing treatment guidelines, considered by other Pain Management Practitioners as being common knowledge and practice, at the time of the intervention. For  Medico-Legal purposes, it is also important to point out that variation in procedural techniques and pharmacological choices are the acceptable norm. The indications, contraindications, technique, and results of the above procedure should only be interpreted and judged by a Board-Certified Interventional Pain Specialist with extensive familiarity and expertise in the same exact procedure and technique.

## 2023-06-22 ENCOUNTER — Ambulatory Visit
Admission: RE | Admit: 2023-06-22 | Discharge: 2023-06-22 | Disposition: A | Discharge status: Home / Self Care | Source: Ambulatory Visit | Attending: Pain Medicine | Admitting: Pain Medicine

## 2023-06-22 ENCOUNTER — Encounter: Payer: Self-pay | Admitting: Pain Medicine

## 2023-06-22 ENCOUNTER — Ambulatory Visit: Attending: Pain Medicine | Admitting: Pain Medicine

## 2023-06-22 VITALS — BP 115/80 | HR 82 | Temp 97.8°F | Resp 14 | Ht 74.0 in | Wt 247.0 lb

## 2023-06-22 DIAGNOSIS — M47817 Spondylosis without myelopathy or radiculopathy, lumbosacral region: Secondary | ICD-10-CM

## 2023-06-22 DIAGNOSIS — M47816 Spondylosis without myelopathy or radiculopathy, lumbar region: Secondary | ICD-10-CM | POA: Diagnosis present

## 2023-06-22 DIAGNOSIS — M5459 Other low back pain: Secondary | ICD-10-CM

## 2023-06-22 DIAGNOSIS — M545 Low back pain, unspecified: Secondary | ICD-10-CM

## 2023-06-22 DIAGNOSIS — G8918 Other acute postprocedural pain: Secondary | ICD-10-CM

## 2023-06-22 DIAGNOSIS — Z5189 Encounter for other specified aftercare: Secondary | ICD-10-CM

## 2023-06-22 DIAGNOSIS — G8929 Other chronic pain: Secondary | ICD-10-CM | POA: Insufficient documentation

## 2023-06-22 DIAGNOSIS — R937 Abnormal findings on diagnostic imaging of other parts of musculoskeletal system: Secondary | ICD-10-CM | POA: Diagnosis present

## 2023-06-22 DIAGNOSIS — M5137 Other intervertebral disc degeneration, lumbosacral region with discogenic back pain only: Secondary | ICD-10-CM

## 2023-06-22 MED ORDER — ROPIVACAINE HCL 2 MG/ML IJ SOLN
INTRAMUSCULAR | Status: AC
Start: 2023-06-22 — End: ?
  Filled 2023-06-22: qty 20

## 2023-06-22 MED ORDER — FENTANYL CITRATE (PF) 100 MCG/2ML IJ SOLN
INTRAMUSCULAR | Status: AC
  Filled 2023-06-22: qty 2

## 2023-06-22 MED ORDER — FENTANYL CITRATE (PF) 100 MCG/2ML IJ SOLN
25.0000 ug | INTRAMUSCULAR | Status: DC | PRN
  Administered 2023-06-22: 50 ug via INTRAVENOUS

## 2023-06-22 MED ORDER — ROPIVACAINE HCL 2 MG/ML IJ SOLN
INTRAMUSCULAR | Status: AC
  Filled 2023-06-22: qty 20

## 2023-06-22 MED ORDER — TRIAMCINOLONE ACETONIDE 40 MG/ML IJ SUSP
40.0000 mg | Freq: Once | INTRAMUSCULAR | Status: AC
  Administered 2023-06-22: 40 mg

## 2023-06-22 MED ORDER — LIDOCAINE HCL 2 % IJ SOLN
20.0000 mL | Freq: Once | INTRAMUSCULAR | Status: AC
Start: 2023-06-22 — End: 2023-06-22
  Administered 2023-06-22: 400 mg

## 2023-06-22 MED ORDER — MIDAZOLAM HCL 5 MG/5ML IJ SOLN
INTRAMUSCULAR | Status: AC
Start: 2023-06-22 — End: ?
  Filled 2023-06-22: qty 5

## 2023-06-22 MED ORDER — ROPIVACAINE HCL 2 MG/ML IJ SOLN
9.0000 mL | Freq: Once | INTRAMUSCULAR | Status: AC
  Administered 2023-06-22: 9 mL via PERINEURAL

## 2023-06-22 MED ORDER — LIDOCAINE HCL 2 % IJ SOLN
INTRAMUSCULAR | Status: AC
  Filled 2023-06-22: qty 20

## 2023-06-22 MED ORDER — OXYCODONE-ACETAMINOPHEN 5-325 MG PO TABS: 1.0000 | ORAL_TABLET | Freq: Three times a day (TID) | ORAL | 0 refills | Status: AC | PRN

## 2023-06-22 MED ORDER — PENTAFLUOROPROP-TETRAFLUOROETH EX AERO
INHALATION_SPRAY | Freq: Once | CUTANEOUS | Status: AC
  Administered 2023-06-22: 30 via TOPICAL

## 2023-06-22 MED ORDER — TRIAMCINOLONE ACETONIDE 40 MG/ML IJ SUSP
INTRAMUSCULAR | Status: AC
  Filled 2023-06-22: qty 1

## 2023-06-22 MED ORDER — MIDAZOLAM HCL 5 MG/5ML IJ SOLN
0.5000 mg | Freq: Once | INTRAMUSCULAR | Status: AC
  Administered 2023-06-22: 2 mg via INTRAVENOUS

## 2023-06-23 ENCOUNTER — Telehealth: Payer: Self-pay | Admitting: *Deleted

## 2023-06-23 NOTE — Telephone Encounter (Signed)
 No problems post procedure.

## 2023-06-29 ENCOUNTER — Telehealth (HOSPITAL_COMMUNITY): Payer: Self-pay | Admitting: *Deleted

## 2023-06-29 NOTE — Telephone Encounter (Signed)
 Received call from patient regarding upcoming cardiac imaging study; pt verbalizes understanding of appt date/time, parking situation and where to check in, pre-test NPO status and medications ordered, and verified current allergies; name and call back number provided for further questions should they arise Johney Frame RN Navigator Cardiac Imaging [facility] [phone] office [phone] cell

## 2023-06-29 NOTE — Telephone Encounter (Signed)
 Attempted to call patient regarding upcoming cardiac CT appointment. Left message on voicemail with name and callback number  Larey Brick RN Navigator Cardiac Imaging [facility] [phone] Office [phone] Cell

## 2023-07-01 ENCOUNTER — Ambulatory Visit
Admission: RE | Admit: 2023-07-01 | Discharge: 2023-07-01 | Disposition: A | Discharge status: Home / Self Care | Source: Ambulatory Visit | Attending: Student | Admitting: Student

## 2023-07-01 MED ORDER — IOHEXOL 350 MG/ML SOLN
100.0000 mL | Freq: Once | INTRAVENOUS | Status: DC | PRN
Start: 2023-07-01 — End: 2023-07-02

## 2023-07-01 NOTE — Progress Notes (Signed)
 Pt states he took his med about hour ago, Pt HR still at 81. Pt taken to back area to relax and give time for meds to kick in. Pt verbalized understanding.

## 2023-07-05 ENCOUNTER — Other Ambulatory Visit: Payer: Self-pay

## 2023-07-05 ENCOUNTER — Other Ambulatory Visit: Payer: Self-pay | Admitting: Nurse Practitioner

## 2023-07-05 MED ORDER — "BD SAFETYGLIDE NEEDLE 18G X 1-1/2"" MISC": 1.0000 mg | 0 refills | Status: DC

## 2023-07-05 MED ORDER — "BD SAFETYGLIDE NEEDLE 21G X 1"" MISC": 1.0000 mg | 0 refills | Status: DC

## 2023-07-06 ENCOUNTER — Ambulatory Visit: Admitting: Pain Medicine

## 2023-07-06 NOTE — Telephone Encounter (Signed)
 Requested Prescriptions  Pending Prescriptions Disp Refills   metFORMIN  (GLUCOPHAGE -XR) 500 MG 24 hr tablet [Pharmacy Med Name: METFORMIN  HCL ER 500 MG TABLET] 360 tablet 0    Sig: TAKE 4 TABLETS BY MOUTH DAILY     Endocrinology:  Diabetes - Biguanides Failed - 07/06/2023  9:37 AM      Failed - HBA1C is between 0 and 7.9 and within 180 days    Hgb A1c MFr Bld  Date Value Ref Range Status  02/25/2023 8.3 (H) 4.8 - 5.6 % Final    Comment:             Prediabetes: 5.7 - 6.4          Diabetes: >6.4          Glycemic control for adults with diabetes: <7.0          Passed - Cr in normal range and within 360 days    Creatinine  Date Value Ref Range Status  03/13/2014 1.08 0.60 - 1.30 mg/dL Final   Creatinine, Ser  Date Value Ref Range Status  02/25/2023 1.15 0.76 - 1.27 mg/dL Final         Passed - eGFR in normal range and within 360 days    EGFR (African American)  Date Value Ref Range Status  03/13/2014 >60 >10mL/min Final  03/15/2012 >60  Final   GFR calc Af Amer  Date Value Ref Range Status  12/07/2019 59 (L) >60 mL/min Final   EGFR (Non-African Amer.)  Date Value Ref Range Status  03/13/2014 >60 >52mL/min Final    Comment:    eGFR values <36mL/min/1.73 m2 may be an indication of chronic kidney disease (CKD). Calculated eGFR, using the MRDR Study equation, is useful in  patients with stable renal function. The eGFR calculation will not be reliable in acutely ill patients when serum creatinine is changing rapidly. It is not useful in patients on dialysis. The eGFR calculation may not be applicable to patients at the low and high extremes of body sizes, pregnant women, and vegetarians.   03/15/2012 >60  Final    Comment:    eGFR values <76mL/min/1.73 m2 may be an indication of chronic kidney disease (CKD). Calculated eGFR is useful in patients with stable renal function. The eGFR calculation will not be reliable in acutely ill patients when serum creatinine is  changing rapidly. It is not useful in  patients on dialysis. The eGFR calculation may not be applicable to patients at the low and high extremes of body sizes, pregnant women, and vegetarians.    GFR, Estimated  Date Value Ref Range Status  02/15/2022 >60 >60 mL/min Final    Comment:    (NOTE) Calculated using the CKD-EPI Creatinine Equation (2021)    eGFR  Date Value Ref Range Status  02/25/2023 73 >59 mL/min/1.73 Final         Passed - B12 Level in normal range and within 720 days    Vitamin B-12  Date Value Ref Range Status  09/08/2021 391 180 - 914 pg/mL Final    Comment:    (NOTE) This assay is not validated for testing neonatal or myeloproliferative syndrome specimens for Vitamin B12 levels. Performed at [facility], [address] [place], [place] [address]          Passed - Valid encounter within last 6 months    Recent Outpatient Visits           1 month ago Chronic obstructive pulmonary disease,  unspecified COPD type [facility])   [place] [facility] Aileen Alexanders, NP   2 months ago Recurrent boils   [facility] [facility] Aileen Alexanders, NP       Future Appointments             In 3 months Harris Liming, MD [facility]            Passed - CBC within normal limits and completed in the last 12 months    WBC  Date Value Ref Range Status  02/25/2023 7.0 3.4 - 10.8 x10E3/uL Final  02/15/2022 6.6 4.0 - 10.5 K/uL Final   RBC  Date Value Ref Range Status  02/25/2023 5.32 4.14 - 5.80 x10E6/uL Final  02/15/2022 3.91 (L) 4.22 - 5.81 MIL/uL Final   Hemoglobin  Date Value Ref Range Status  02/25/2023 11.6 (L) 13.0 - 17.7 g/dL Final   Hematocrit  Date Value Ref Range Status  02/25/2023 39.6 37.5 - 51.0 % Final   MCHC  Date Value Ref Range Status  02/25/2023 29.3 (L) 31.5 - 35.7 g/dL Final  16/12/9602 54.0 30.0 - 36.0 g/dL Final   [facility]  Date Value Ref Range Status  02/25/2023  21.8 (L) 26.6 - 33.0 pg Final  02/15/2022 27.1 26.0 - 34.0 pg Final   MCV  Date Value Ref Range Status  02/25/2023 74 (L) 79 - 97 fL Final  03/13/2014 83 80 - 100 fL Final   No results found for: "PLTCOUNTKUC", "LABPLAT", "POCPLA" RDW  Date Value Ref Range Status  02/25/2023 17.5 (H) 11.6 - 15.4 % Final  03/13/2014 15.2 (H) 11.5 - 14.5 % Final

## 2023-07-08 ENCOUNTER — Ambulatory Visit: Admitting: Pain Medicine

## 2023-07-09 ENCOUNTER — Other Ambulatory Visit: Payer: Self-pay | Admitting: Nurse Practitioner

## 2023-07-09 NOTE — Telephone Encounter (Signed)
 Requested Prescriptions  Pending Prescriptions Disp Refills   metFORMIN  (GLUCOPHAGE -XR) 500 MG 24 hr tablet [Pharmacy Med Name: METFORMIN  HCL ER 500 MG TABLET] 360 tablet 0    Sig: TAKE 4 TABLETS BY MOUTH DAILY     Endocrinology:  Diabetes - Biguanides Failed - 07/09/2023  3:52 PM      Failed - HBA1C is between 0 and 7.9 and within 180 days    Hgb A1c MFr Bld  Date Value Ref Range Status  02/25/2023 8.3 (H) 4.8 - 5.6 % Final    Comment:             Prediabetes: 5.7 - 6.4          Diabetes: >6.4          Glycemic control for adults with diabetes: <7.0          Passed - Cr in normal range and within 360 days    Creatinine  Date Value Ref Range Status  03/13/2014 1.08 0.60 - 1.30 mg/dL Final   Creatinine, Ser  Date Value Ref Range Status  02/25/2023 1.15 0.76 - 1.27 mg/dL Final         Passed - eGFR in normal range and within 360 days    EGFR (African American)  Date Value Ref Range Status  03/13/2014 >60 >28mL/min Final  03/15/2012 >60  Final   GFR calc Af Amer  Date Value Ref Range Status  12/07/2019 59 (L) >60 mL/min Final   EGFR (Non-African Amer.)  Date Value Ref Range Status  03/13/2014 >60 >76mL/min Final    Comment:    eGFR values <30mL/min/1.73 m2 may be an indication of chronic kidney disease (CKD). Calculated eGFR, using the MRDR Study equation, is useful in  patients with stable renal function. The eGFR calculation will not be reliable in acutely ill patients when serum creatinine is changing rapidly. It is not useful in patients on dialysis. The eGFR calculation may not be applicable to patients at the low and high extremes of body sizes, pregnant women, and vegetarians.   03/15/2012 >60  Final    Comment:    eGFR values <11mL/min/1.73 m2 may be an indication of chronic kidney disease (CKD). Calculated eGFR is useful in patients with stable renal function. The eGFR calculation will not be reliable in acutely ill patients when serum creatinine is  changing rapidly. It is not useful in  patients on dialysis. The eGFR calculation may not be applicable to patients at the low and high extremes of body sizes, pregnant women, and vegetarians.    GFR, Estimated  Date Value Ref Range Status  02/15/2022 >60 >60 mL/min Final    Comment:    (NOTE) Calculated using the CKD-EPI Creatinine Equation (2021)    eGFR  Date Value Ref Range Status  02/25/2023 73 >59 mL/min/1.73 Final         Passed - B12 Level in normal range and within 720 days    Vitamin B-12  Date Value Ref Range Status  09/08/2021 391 180 - 914 pg/mL Final    Comment:    (NOTE) This assay is not validated for testing neonatal or myeloproliferative syndrome specimens for Vitamin B12 levels. Performed at [facility], [address] [place], [place] [address]          Passed - Valid encounter within last 6 months    Recent Outpatient Visits           1 month ago Chronic obstructive pulmonary disease,  unspecified COPD type [facility])   [place] [facility] Aileen Alexanders, NP   2 months ago Recurrent boils   [place] [facility] Aileen Alexanders, NP       Future Appointments             In 2 months Harris Liming, MD [facility]            Passed - CBC within normal limits and completed in the last 12 months    WBC  Date Value Ref Range Status  02/25/2023 7.0 3.4 - 10.8 x10E3/uL Final  02/15/2022 6.6 4.0 - 10.5 K/uL Final   RBC  Date Value Ref Range Status  02/25/2023 5.32 4.14 - 5.80 x10E6/uL Final  02/15/2022 3.91 (L) 4.22 - 5.81 MIL/uL Final   Hemoglobin  Date Value Ref Range Status  02/25/2023 11.6 (L) 13.0 - 17.7 g/dL Final   Hematocrit  Date Value Ref Range Status  02/25/2023 39.6 37.5 - 51.0 % Final   MCHC  Date Value Ref Range Status  02/25/2023 29.3 (L) 31.5 - 35.7 g/dL Final  13/24/4010 27.2 30.0 - 36.0 g/dL Final   [facility]  Date Value Ref Range Status  02/25/2023  21.8 (L) 26.6 - 33.0 pg Final  02/15/2022 27.1 26.0 - 34.0 pg Final   MCV  Date Value Ref Range Status  02/25/2023 74 (L) 79 - 97 fL Final  03/13/2014 83 80 - 100 fL Final   No results found for: "PLTCOUNTKUC", "LABPLAT", "POCPLA" RDW  Date Value Ref Range Status  02/25/2023 17.5 (H) 11.6 - 15.4 % Final  03/13/2014 15.2 (H) 11.5 - 14.5 % Final

## 2023-07-13 ENCOUNTER — Ambulatory Visit
Admission: RE | Admit: 2023-07-13 | Discharge: 2023-07-13 | Disposition: A | Discharge status: Home / Self Care | Source: Ambulatory Visit | Attending: Pain Medicine | Admitting: Pain Medicine

## 2023-07-13 ENCOUNTER — Ambulatory Visit: Attending: Pain Medicine | Admitting: Pain Medicine

## 2023-07-13 VITALS — BP 148/81 | HR 94 | Temp 97.9°F | Resp 18 | Ht 74.0 in | Wt 247.0 lb

## 2023-07-13 DIAGNOSIS — M47817 Spondylosis without myelopathy or radiculopathy, lumbosacral region: Secondary | ICD-10-CM | POA: Insufficient documentation

## 2023-07-13 DIAGNOSIS — M47816 Spondylosis without myelopathy or radiculopathy, lumbar region: Secondary | ICD-10-CM | POA: Insufficient documentation

## 2023-07-13 DIAGNOSIS — Z5189 Encounter for other specified aftercare: Secondary | ICD-10-CM | POA: Insufficient documentation

## 2023-07-13 DIAGNOSIS — M545 Low back pain, unspecified: Secondary | ICD-10-CM | POA: Insufficient documentation

## 2023-07-13 DIAGNOSIS — Z09 Encounter for follow-up examination after completed treatment for conditions other than malignant neoplasm: Secondary | ICD-10-CM | POA: Diagnosis present

## 2023-07-13 DIAGNOSIS — R937 Abnormal findings on diagnostic imaging of other parts of musculoskeletal system: Secondary | ICD-10-CM | POA: Diagnosis present

## 2023-07-13 DIAGNOSIS — G8929 Other chronic pain: Secondary | ICD-10-CM | POA: Insufficient documentation

## 2023-07-13 DIAGNOSIS — G8918 Other acute postprocedural pain: Secondary | ICD-10-CM | POA: Diagnosis present

## 2023-07-13 DIAGNOSIS — M5459 Other low back pain: Secondary | ICD-10-CM | POA: Insufficient documentation

## 2023-07-13 DIAGNOSIS — M5137 Other intervertebral disc degeneration, lumbosacral region with discogenic back pain only: Secondary | ICD-10-CM | POA: Diagnosis present

## 2023-07-13 MED ORDER — ROPIVACAINE HCL 2 MG/ML IJ SOLN
10.0000 mL | Freq: Once | INTRAMUSCULAR | Status: AC
  Administered 2023-07-13: 10 mL via PERINEURAL

## 2023-07-13 MED ORDER — MIDAZOLAM HCL 5 MG/5ML IJ SOLN
INTRAMUSCULAR | Status: AC
  Filled 2023-07-13: qty 5

## 2023-07-13 MED ORDER — PENTAFLUOROPROP-TETRAFLUOROETH EX AERO
INHALATION_SPRAY | Freq: Once | CUTANEOUS | Status: AC
Start: 2023-07-13 — End: 2023-07-13
  Administered 2023-07-13: 30 via TOPICAL

## 2023-07-13 MED ORDER — ROPIVACAINE HCL 2 MG/ML IJ SOLN
INTRAMUSCULAR | Status: AC
  Filled 2023-07-13: qty 20

## 2023-07-13 MED ORDER — FENTANYL CITRATE (PF) 100 MCG/2ML IJ SOLN
25.0000 ug | INTRAMUSCULAR | Status: DC | PRN
  Administered 2023-07-13: 50 ug via INTRAVENOUS

## 2023-07-13 MED ORDER — LIDOCAINE HCL 2 % IJ SOLN
INTRAMUSCULAR | Status: AC
  Filled 2023-07-13: qty 20

## 2023-07-13 MED ORDER — FENTANYL CITRATE (PF) 100 MCG/2ML IJ SOLN
INTRAMUSCULAR | Status: AC
  Filled 2023-07-13: qty 2

## 2023-07-13 MED ORDER — MIDAZOLAM HCL 5 MG/5ML IJ SOLN
0.5000 mg | Freq: Once | INTRAMUSCULAR | Status: AC
Start: 2023-07-13 — End: 2023-07-13
  Administered 2023-07-13: 4 mg via INTRAVENOUS

## 2023-07-13 MED ORDER — LIDOCAINE HCL 2 % IJ SOLN
20.0000 mL | Freq: Once | INTRAMUSCULAR | Status: AC
Start: 2023-07-13 — End: 2023-07-13
  Administered 2023-07-13: 400 mg

## 2023-07-13 MED ORDER — OXYCODONE-ACETAMINOPHEN 5-325 MG PO TABS: 1.0000 | ORAL_TABLET | Freq: Three times a day (TID) | ORAL | 0 refills | Status: AC | PRN

## 2023-07-13 NOTE — Patient Instructions (Signed)

## 2023-07-13 NOTE — Progress Notes (Signed)
 PROVIDER NOTE: Interpretation of information contained herein should be left to medically-trained personnel. Specific patient instructions are provided elsewhere under "Patient Instructions" section of medical record. This document was created in part using STT-dictation technology, any transcriptional errors that may result from this process are unintentional.  Patient: Darren Allen Type: Established DOB: 11-Mar-1966 MRN: [id number] PCP: Aileen Alexanders, NP  Service: Procedure DOS: 07/13/2023 Setting: Ambulatory Location: Ambulatory outpatient facility Delivery: Face-to-face Provider: Candi Chafe, MD Specialty: Interventional Pain Management Specialty designation: 09 Location: Outpatient facility Ref. Prov.: Aileen Alexanders, NP       Interventional Therapy   Procedure: Lumbar Facet, Medial Branch Radiofrequency Ablation (RFA) #1 (NO STEROIDS) Laterality: Right (-RT)  Level: L2, L3, L4, L5, and S1 Medial Branch Level(s). These levels will denervate the L3-4, L4-5, and L5-S1 lumbar facet joints.  Imaging: Fluoroscopy-guided Spinal ([id number]) Anesthesia: Local anesthesia (1-2% Lidocaine ) Anxiolysis: IV Versed  4.0 mg Sedation:   Fentanyl  1 mL (50 mcg) DOS: 07/13/2023  Performed by: Candi Chafe, MD  Purpose: Therapeutic/Palliative Indications: Low back pain severe enough to impact quality of life or function. Indications: 1. Chronic low back pain (1ry area of Pain) (Bilateral) (R>L) w/o sciatica   2. DDD (degenerative disc disease), lumbosacral w/ LBP   3. Lumbar facet joint pain   4. Lumbar facet syndrome   5. Spondylosis without myelopathy or radiculopathy, lumbosacral region   6. Low back pain of over 3 months duration   7. Mechanical low back pain   8. Recurrent low back pain   9. Abnormal MRI, lumbar spine (03/11/2020)    [name] has been dealing with the above chronic pain for longer than three months and has either failed to respond, was unable to  tolerate, or simply did not get enough benefit from other more conservative therapies including, but not limited to: 1. Over-the-counter medications 2. Anti-inflammatory medications 3. Muscle relaxants 4. Membrane stabilizers 5. Opioids 6. Physical therapy and/or chiropractic manipulation 7. Modalities (Heat, ice, etc.) 8. Invasive techniques such as nerve blocks. [name] has attained more than 50% relief of the pain from a series of diagnostic injections conducted in separate occasions.  Pain Score: Pre-procedure: 6 /10 Post-procedure: 0-No pain/10     Position / Prep / Materials:  Position: Prone  Prep solution: ChloraPrep (2% chlorhexidine  gluconate and 70% isopropyl alcohol) Prep Area: Entire Lumbosacral Region (Lower back from mid-thoracic region to end of tailbone and from flank to flank.) Materials:  Tray: RFA (Radiofrequency) tray Needle(s):  Type: RFA (Teflon-coated radiofrequency ablation needles) Gauge (G): 22  Length: Regular (10cm) Qty: 5     Post-procedure evaluation   Procedure: Lumbar Facet, Medial Branch Radiofrequency Ablation (RFA) #1 (w/o Steroids) Laterality: Left (-LT)  Level: L2, L3, L4, L5, and S1 Medial Branch Level(s). These levels will denervate the L3-4, L4-5, and L5-S1 lumbar facet joints.  Imaging: Fluoroscopy-guided         Anesthesia: Local anesthesia (1-2% Lidocaine ) Anxiolysis: IV Versed  2.0 mg Sedation: Moderate Sedation Fentanyl  1 mL (50 mcg) DOS: 06/22/2023  Performed by: Candi Chafe, MD  Purpose: Therapeutic/Palliative Indications: Low back pain severe enough to impact quality of life or function.  Pain Score: Pre-procedure: 4 /10 Post-procedure: 0-No pain/10   Effectiveness:  Initial hour after procedure: 100 %. Subsequent 4-6 hours post-procedure: 100 %. Analgesia past initial 6 hours: 75 %. Ongoing improvement:  Analgesic: (Postop date: 21) the patient indicates having attained 100% relief of the pain for the  duration of local anesthetic followed by an  ongoing 75% improvement of his low back pain on the left side given the low he has only been 21 days since the radiofrequency. Function: [name] reports improvement in function ROM: [name] reports improvement in ROM  H&P (Pre-op Assessment):  [name] is a 58 y.o. (year old), male patient, seen today for interventional treatment. He  has a past surgical history that includes Appendectomy; Incision and drainage perirectal abscess (N/A, 02/04/2015); Rectal exam under anesthesia (02/04/2015); ORIF ankle fracture (Left, 03/23/2017); Syndesmosis repair (Left, 03/23/2017); Colonoscopy with propofol  (N/A, 12/31/2021); Esophagogastroduodenoscopy (N/A, 12/31/2021); Kidney stone surgery (Right); lung mass removal (N/A); Xi robotic assisted paraesophageal hernia repair (N/A, 01/20/2022); Insertion of mesh (01/20/2022); Umbilical hernia repair (01/20/2022); Vasectomy; Circumcision; Bronchoscopy (2016); Muscle biopsy; Ventral hernia repair (N/A, 04/08/2023); and Insertion of mesh (N/A, 04/08/2023). [name] has a current medication list which includes the following prescription(s): amitriptyline , ascorbic acid, breztri aerosphere, clotrimazole -betamethasone , cyanocobalamin, dapagliflozin  propanediol, ferrous sulfate , fluticasone , gabapentin , hydrochlorothiazide , hydrocodone -acetaminophen , [START ON 07/21/2023] hydrocodone -acetaminophen , humulin 70/30 kwikpen, ipratropium, ipratropium-albuterol , lisinopril , magnesium  oxide -mg supplement, metformin , metoprolol  tartrate, metronidazole , montelukast , naloxone , bd safetyglide needle, bd safetyglide needle, oxycodone -acetaminophen , [START ON 07/20/2023] oxycodone -acetaminophen , pantoprazole , PRESCRIPTION MEDICATION, proair  hfa, rosuvastatin , tazarotene , terbinafine , testosterone  cypionate, and mounjaro, and the following Facility-Administered Medications: fentanyl . His primarily concern today is the Back Pain (Right,  lower)  Initial Vital Signs:  Pulse/HCG Rate: 94ECG Heart Rate: 86 (nsr) Temp: 97.7 F (36.5 C) Resp: 16 BP: 129/81 SpO2: 99 %  BMI: Estimated body mass index is 31.71 kg/m as calculated from the following:   Height as of this encounter: 6\' 2"  (1.88 m).   Weight as of this encounter: 247 lb (112 kg).  Risk Assessment: Allergies: Reviewed. He is allergic to glipizide, cephalexin , duloxetine, and dupixent [dupilumab].  Allergy Precautions: None required Coagulopathies: Reviewed. None identified.  Blood-thinner therapy: None at this time Active Infection(s): Reviewed. None identified. Mr. Guandique is afebrile  Site Confirmation: Mr. Soares was asked to confirm the procedure and laterality before marking the site Procedure checklist: Completed Consent: Before the procedure and under the influence of no sedative(s), amnesic(s), or anxiolytics, the patient was informed of the treatment options, risks and possible complications. To fulfill our ethical and legal obligations, as recommended by the American Medical Association's Code of Ethics, I have informed the patient of my clinical impression; the nature and purpose of the treatment or procedure; the risks, benefits, and possible complications of the intervention; the alternatives, including doing nothing; the risk(s) and benefit(s) of the alternative treatment(s) or procedure(s); and the risk(s) and benefit(s) of doing nothing. The patient was provided information about the general risks and possible complications associated with the procedure. These may include, but are not limited to: failure to achieve desired goals, infection, bleeding, organ or nerve damage, allergic reactions, paralysis, and death. In addition, the patient was informed of those risks and complications associated to Spine-related procedures, such as failure to decrease pain; infection (i.e.: Meningitis, epidural or intraspinal abscess); bleeding (i.e.: epidural hematoma,  subarachnoid hemorrhage, or any other type of intraspinal or peri-dural bleeding); organ or nerve damage (i.e.: Any type of peripheral nerve, nerve root, or spinal cord injury) with subsequent damage to sensory, motor, and/or autonomic systems, resulting in permanent pain, numbness, and/or weakness of one or several areas of the body; allergic reactions; (i.e.: anaphylactic reaction); and/or death. Furthermore, the patient was informed of those risks and complications associated with the medications. These include, but are not limited to: allergic reactions (i.e.: anaphylactic or anaphylactoid reaction(s)); adrenal axis suppression;  blood sugar elevation that in diabetics may result in ketoacidosis or comma; water retention that in patients with history of congestive heart failure may result in shortness of breath, pulmonary edema, and decompensation with resultant heart failure; weight gain; swelling or edema; medication-induced neural toxicity; particulate matter embolism and blood vessel occlusion with resultant organ, and/or nervous system infarction; and/or aseptic necrosis of one or more joints. Finally, the patient was informed that Medicine is not an exact science; therefore, there is also the possibility of unforeseen or unpredictable risks and/or possible complications that may result in a catastrophic outcome. The patient indicated having understood very clearly. We have given the patient no guarantees and we have made no promises. Enough time was given to the patient to ask questions, all of which were answered to the patient's satisfaction. Mr. Maylor has indicated that he wanted to continue with the procedure. Attestation: I, the ordering provider, attest that I have discussed with the patient the benefits, risks, side-effects, alternatives, likelihood of achieving goals, and potential problems during recovery for the procedure that I have provided informed consent. Date  Time: 07/13/2023  8:52  AM  Pre-Procedure Preparation:  Monitoring: As per clinic protocol. Respiration, ETCO2, SpO2, BP, heart rate and rhythm monitor placed and checked for adequate function Safety Precautions: Patient was assessed for positional comfort and pressure points before starting the procedure. Time-out: I initiated and conducted the "Time-out" before starting the procedure, as per protocol. The patient was asked to participate by confirming the accuracy of the "Time Out" information. Verification of the correct person, site, and procedure were performed and confirmed by me, the nursing staff, and the patient. "Time-out" conducted as per Joint Commission's Universal Protocol (UP.01.01.01). Time: 1000 Start Time: 1001 hrs.  Description of Procedure:          Laterality: See above. Levels:  See above. Safety Precautions: Aspiration looking for blood return was conducted prior to all injections. At no point did we inject any substances, as a needle was being advanced. Before injecting, the patient was told to immediately notify me if he was experiencing any new onset of "ringing in the ears, or metallic taste in the mouth". No attempts were made at seeking any paresthesias. Safe injection practices and needle disposal techniques used. Medications properly checked for expiration dates. SDV (single dose vial) medications used. After the completion of the procedure, all disposable equipment used was discarded in the proper designated medical waste containers. Local Anesthesia: Protocol guidelines were followed. The patient was positioned over the fluoroscopy table. The area was prepped in the usual manner. The time-out was completed. The target area was identified using fluoroscopy. A 12-in long, straight, sterile hemostat was used with fluoroscopic guidance to locate the targets for each level blocked. Once located, the skin was marked with an approved surgical skin marker. Once all sites were marked, the skin  (epidermis, dermis, and hypodermis), as well as deeper tissues (fat, connective tissue and muscle) were infiltrated with a small amount of a short-acting local anesthetic, loaded on a 10cc syringe with a 25G, 1.5-in  Needle. An appropriate amount of time was allowed for local anesthetics to take effect before proceeding to the next step. Technical description of process:  Radiofrequency Ablation (RFA) L2 Medial Branch Nerve RFA: The target area for the L2 medial branch is at the junction of the postero-lateral aspect of the superior articular process and the superior, posterior, and medial edge of the transverse process of L3. Under fluoroscopic guidance, a Radiofrequency needle  was inserted until contact was made with os over the superior postero-lateral aspect of the pedicular shadow (target area). Sensory and motor testing was conducted to properly adjust the position of the needle. Once satisfactory placement of the needle was achieved, the numbing solution was slowly injected after negative aspiration for blood. 2.0 mL of the nerve block solution was injected without difficulty or complication. After waiting for at least 3 minutes, the ablation was performed. Once completed, the needle was removed intact. L3 Medial Branch Nerve RFA: The target area for the L3 medial branch is at the junction of the postero-lateral aspect of the superior articular process and the superior, posterior, and medial edge of the transverse process of L4. Under fluoroscopic guidance, a Radiofrequency needle was inserted until contact was made with os over the superior postero-lateral aspect of the pedicular shadow (target area). Sensory and motor testing was conducted to properly adjust the position of the needle. Once satisfactory placement of the needle was achieved, the numbing solution was slowly injected after negative aspiration for blood. 2.0 mL of the nerve block solution was injected without difficulty or complication.  After waiting for at least 3 minutes, the ablation was performed. Once completed, the needle was removed intact. L4 Medial Branch Nerve RFA: The target area for the L4 medial branch is at the junction of the postero-lateral aspect of the superior articular process and the superior, posterior, and medial edge of the transverse process of L5. Under fluoroscopic guidance, a Radiofrequency needle was inserted until contact was made with os over the superior postero-lateral aspect of the pedicular shadow (target area). Sensory and motor testing was conducted to properly adjust the position of the needle. Once satisfactory placement of the needle was achieved, the numbing solution was slowly injected after negative aspiration for blood. 2.0 mL of the nerve block solution was injected without difficulty or complication. After waiting for at least 3 minutes, the ablation was performed. Once completed, the needle was removed intact. L5 Medial Branch Nerve RFA: The target area for the L5 medial branch is at the junction of the postero-lateral aspect of the superior articular process of S1 and the superior, posterior, and medial edge of the sacral ala. Under fluoroscopic guidance, a Radiofrequency needle was inserted until contact was made with os over the superior postero-lateral aspect of the pedicular shadow (target area). Sensory and motor testing was conducted to properly adjust the position of the needle. Once satisfactory placement of the needle was achieved, the numbing solution was slowly injected after negative aspiration for blood. 2.0 mL of the nerve block solution was injected without difficulty or complication. After waiting for at least 3 minutes, the ablation was performed. Once completed, the needle was removed intact. S1 Medial Branch Nerve RFA: The target area for the S1 medial branch is located inferior to the junction of the S1 superior articular process and the L5 inferior articular process, posterior,  inferior, and lateral to the 6 o'clock position of the L5-S1 facet joint, just superior to the S1 posterior foramen. Under fluoroscopic guidance, the Radiofrequency needle was advanced until contact was made with os over the Target area. Sensory and motor testing was conducted to properly adjust the position of the needle. Once satisfactory placement of the needle was achieved, the numbing solution was slowly injected after negative aspiration for blood. 2.0 mL of the nerve block solution was injected without difficulty or complication. After waiting for at least 3 minutes, the ablation was performed. Once completed, the needle  was removed intact. Radiofrequency lesioning (ablation):  Radiofrequency Generator: Medtronic AccurianTM AG 1000 RF Generator Sensory Stimulation Parameters: 50 Hz was used to locate & identify the nerve, making sure that the needle was positioned such that there was no sensory stimulation below 0.3 V or above 0.7 V. Motor Stimulation Parameters: 2 Hz was used to evaluate the motor component. Care was taken not to lesion any nerves that demonstrated motor stimulation of the lower extremities at an output of less than 2.5 times that of the sensory threshold, or a maximum of 2.0 V. Lesioning Technique Parameters: Standard Radiofrequency settings. (Not bipolar or pulsed.) Temperature Settings: 80 degrees C Lesioning time: 60 seconds Stationary intra-operative compliance: Compliant  Once the entire procedure was completed, the treated area was cleaned, making sure to leave some of the prepping solution back to take advantage of its long term bactericidal properties.    Illustration of the posterior view of the lumbar spine and the posterior neural structures. Laminae of L2 through S1 are labeled. DPRL5, dorsal primary ramus of L5; DPRS1, dorsal primary ramus of S1; DPR3, dorsal primary ramus of L3; FJ, facet (zygapophyseal) joint L3-L4; I, inferior articular process of L4; LB1,  lateral branch of dorsal primary ramus of L1; IAB, inferior articular branches from L3 medial branch (supplies L4-L5 facet joint); IBP, intermediate branch plexus; MB3, medial branch of dorsal primary ramus of L3; NR3, third lumbar nerve root; S, superior articular process of L5; SAB, superior articular branches from L4 (supplies L4-5 facet joint also); TP3, transverse process of L3.  Facet Joint Innervation (* possible contribution)  L1-2 T12, L1 (L2*)  Medial Branch  L2-3 L1, L2 (L3*)         "          "  L3-4 L2, L3 (L4*)         "          "  L4-5 L3, L4 (L5*)         "          "  L5-S1 L4, L5, S1          "          "    Vitals:   07/13/23 1020 07/13/23 1025 07/13/23 1030 07/13/23 1035  BP: (!) 146/100 (!) 148/108 (!) 151/91 (!) 145/103  Pulse:      Resp: 18 17 15 17   Temp:      TempSrc:      SpO2: 100% 100% 100% 100%  Weight:      Height:        Start Time: 1001 hrs. End Time: 1035 hrs.  Imaging Guidance (Spinal):          Type of Imaging Technique: Fluoroscopy Guidance (Spinal) Indication(s): Fluoroscopy guidance for needle placement to enhance accuracy in procedures requiring precise needle localization for targeted delivery of medication in or near specific anatomical locations not easily accessible without such real-time imaging assistance. Exposure Time: Please see nurses notes. Contrast: None used. Fluoroscopic Guidance: I was personally present during the use of fluoroscopy. "Tunnel Vision Technique" used to obtain the best possible view of the target area. Parallax error corrected before commencing the procedure. "Direction-depth-direction" technique used to introduce the needle under continuous pulsed fluoroscopy. Once target was reached, antero-posterior, oblique, and lateral fluoroscopic projection used confirm needle placement in all planes. Images permanently stored in EMR. Interpretation: No contrast injected. I personally interpreted the imaging intraoperatively.  Adequate needle placement confirmed in multiple planes.  Permanent images saved into the patient's record.  Antibiotic Prophylaxis:   Anti-infectives (From admission, onward)    None      Indication(s): None identified  Post-operative Assessment:  Post-procedure Vital Signs:  Pulse/HCG Rate: 9494 (nsr) Temp: 97.7 F (36.5 C) Resp: 17 BP: (!) 145/103 SpO2: 100 %  EBL: None  Complications: No immediate post-treatment complications observed by team, or reported by patient.  Note: The patient tolerated the entire procedure well. A repeat set of vitals were taken after the procedure and the patient was kept under observation following institutional policy, for this type of procedure. Post-procedural neurological assessment was performed, showing return to baseline, prior to discharge. The patient was provided with post-procedure discharge instructions, including a section on how to identify potential problems. Should any problems arise concerning this procedure, the patient was given instructions to immediately contact us , at any time, without hesitation. In any case, we plan to contact the patient by telephone for a follow-up status report regarding this interventional procedure.  Comments:  No additional relevant information.  Plan of Care (POC)  Orders:  Orders Placed This Encounter  Procedures   Radiofrequency,Lumbar    Scheduling Instructions:     Side(s): Right-sided     Level: L3-4, L4-5, and L5-S1 Facets (L2, L3, L4, L5, and S1 Medial Branch)     Sedation: With Sedation.     Timeframe: Today    Where will this procedure be performed?:   [facility]   DG [facility] C-ARM 1-60 MIN NO REPORT    Intraoperative interpretation by procedural physician at [facility].    Standing Status:   Standing    Number of Occurrences:   1    Reason for exam::   Assistance in needle guidance and placement for procedures requiring needle placement in or near specific  anatomical locations not easily accessible without such assistance.   Nursing Instructions:    Please complete this patient's postprocedure evaluation.    Scheduling Instructions:     Please complete this patient's postprocedure evaluation.   Informed Consent Details: Physician/Practitioner Attestation; Transcribe to consent form and obtain patient signature    Nursing Order: Transcribe to consent form and obtain patient signature. Note: Always confirm laterality of pain with Mr. Binger, before procedure.    Physician/Practitioner attestation of informed consent for procedure/surgical case:   I, the physician/practitioner, attest that I have discussed with the patient the benefits, risks, side effects, alternatives, likelihood of achieving goals and potential problems during recovery for the procedure that I have provided informed consent.    Procedure:   Lumbar Facet Radiofrequency Ablation    Physician/Practitioner performing the procedure:   Laikyn Gewirtz A. Barth Borne, MD    Indication/Reason:   Low Back Pain, with our without leg pain, due to Facet Joint Arthralgia (Joint Pain) known as Lumbar Facet Syndrome, secondary to Lumbar, and/or Lumbosacral Spondylosis (Arthritis of the Spine), without myelopathy or radiculopathy (Nerve Damage).   Provide equipment / supplies at bedside    Procedure tray: "Radiofrequency Tray" Additional material: Large hemostat (x1); Small hemostat (x1); Towels (x8); 4x4 sterile sponge pack (x1) Needle type: Teflon-coated Radiofrequency Needle (Disposable  single use) Size: Regular Quantity: 5    Standing Status:   Standing    Number of Occurrences:   1    Specify:   Radiofrequency Tray   Saline lock IV    Have LR [id number] mL available and administer at 125 mL/hr if patient becomes hypotensive.    Standing Status:  Standing    Number of Occurrences:   1   Chronic Opioid Analgesic:   Hydrocodone /APAP 5/325 tablet, 1 tab p.o. twice daily (#60) MME/day: 10 mg/day     Medications ordered for procedure: Meds ordered this encounter  Medications   lidocaine  (XYLOCAINE ) 2 % (with pres) injection 400 mg   pentafluoroprop-tetrafluoroeth (GEBAUERS) aerosol   midazolam  (VERSED ) 5 MG/5ML injection 0.5-2 mg    Make sure Flumazenil is available in the pyxis when using this medication. If oversedation occurs, administer 0.2 mg IV over 15 sec. If after 45 sec no response, administer 0.2 mg again over 1 min; may repeat at 1 min intervals; not to exceed 4 doses (1 mg)   fentaNYL  (SUBLIMAZE ) injection 25-50 mcg    Make sure Narcan  is available in the pyxis when using this medication. In the event of respiratory depression (RR< 8/min): Titrate NARCAN  (naloxone ) in increments of 0.1 to 0.2 mg IV at 2-3 minute intervals, until desired degree of reversal.   ropivacaine  (PF) 2 mg/mL (0.2%) (NAROPIN ) injection 10 mL   oxyCODONE -acetaminophen  (PERCOCET) 5-325 MG tablet    Sig: Take 1 tablet by mouth every 8 (eight) hours as needed for up to 7 days for severe pain (pain score 7-10). Must last 7 days.    Dispense:  21 tablet    Refill:  0    For acute post-operative pain. Not to be refilled.  Must last 7 days.   oxyCODONE -acetaminophen  (PERCOCET) 5-325 MG tablet    Sig: Take 1 tablet by mouth every 8 (eight) hours as needed for up to 7 days for severe pain (pain score 7-10). Must last 7 days.    Dispense:  21 tablet    Refill:  0    For acute post-operative pain. Not to be refilled.  Must last 7 days.   Medications administered: We administered lidocaine , pentafluoroprop-tetrafluoroeth, midazolam , fentaNYL , and ropivacaine  (PF) 2 mg/mL (0.2%).  See the medical record for exact dosing, route, and time of administration.  Follow-up plan:   Return in about 6 weeks (around 08/24/2023) for (Face2F), (PPE).       Interventional Therapies  Risk Factors  Considerations:   WNL   Planned  Pending:   Therapeutic right lumbar facet RFA #1 (07/13/2023)    Under consideration:    Therapeutic bilateral lumbar facet RFA #1 (starting with the left side) Therapeutic right L5-S1 LESI #2  Possible spinal cord stimulator trial  Therapeutic left L5-S1 percutaneous discectomy with "[employer] Dekompressor" system    Completed:   Therapeutic left lumbar facet RFA x1 (06/22/2023) (100/100/75/75) (w/ steroids)  Diagnostic bilateral lumbar facet MBB x1 (03/02/2023) (100/100/95/100) (w/ steroids)  Diagnostic bilateral lumbar facet MBB x1 (03/02/2023) (80-100/80-100/0/0) (w/o steroids)  Diagnostic midline to left caudal ESI x1 (04/02/2022) (100/100/100/LBP:100/LEP:100)  Therapeutic left L5-S1 LESI x3 (08/27/2022) (1st:100/100/100/LBP:85  LEP:100) (2nd: 01[birth year]13[birth year]59/LBP:60LLEP:100)  Therapeutic right L5-S1 LESI x1 (12/24/2022) (100/85/100/LBP:100)  Therapeutic bilateral Qutenza  neurolytic treatment x1 (12/23/2021)  (09/08/2021 & 10/29/2021) referral to physical therapy for evaluation and treatment of low back pain. Physical therapy for the lower back ordered.  (06/07/2023-referral entered)    Completed by other providers:   EMG/PNCV of lower extremity (02/20/2020) by Dr. Devora Folks (generalized sensorimotor peripheral neuropathy; superimposed left S1 radiculopathy)   Therapeutic  Palliative (PRN) options:   Diagnostic bilateral lumbar facet MBB #2 (without steroids) (PRN)      Recent Visits Date Type Provider Dept  06/22/23 Procedure visit Renaldo Caroli, MD [facility]  06/07/23 Office Visit Renaldo Caroli, MD  [facility]  05/25/23 Procedure visit Renaldo Caroli, MD [facility]  05/19/23 Office Visit Renaldo Caroli, MD [facility]  Showing recent visits within past 90 days and meeting all other requirements Today's Visits Date Type Provider Dept  07/13/23 Procedure visit Renaldo Caroli, MD [facility]  Showing today's visits and meeting all other requirements Future Appointments Date Type Provider Dept   08/16/23 Appointment Renaldo Caroli, MD [facility]  Showing future appointments within next 90 days and meeting all other requirements  Disposition: Discharge home  Discharge (Date  Time): 07/13/2023;   hrs.   Primary Care Physician: Aileen Alexanders, NP Location: [facility] Note by: Candi Chafe, MD (TTS technology used. I apologize for any typographical errors that were not detected and corrected.) Date: 07/13/2023; Time: 10:40 AM  Disclaimer:  Medicine is not an Visual merchandiser. The only guarantee in medicine is that nothing is guaranteed. It is important to note that the decision to proceed with this intervention was based on the information collected from the patient. The Data and conclusions were drawn from the patient's questionnaire, the interview, and the physical examination. Because the information was provided in large part by the patient, it cannot be guaranteed that it has not been purposely or unconsciously manipulated. Every effort has been made to obtain as much relevant data as possible for this evaluation. It is important to note that the conclusions that lead to this procedure are derived in large part from the available data. Always take into account that the treatment will also be dependent on availability of resources and existing treatment guidelines, considered by other Pain Management Practitioners as being common knowledge and practice, at the time of the intervention. For Medico-Legal purposes, it is also important to point out that variation in procedural techniques and pharmacological choices are the acceptable norm. The indications, contraindications, technique, and results of the above procedure should only be interpreted and judged by a Board-Certified Interventional Pain Specialist with extensive familiarity and expertise in the same exact procedure and technique.

## 2023-07-14 ENCOUNTER — Telehealth: Payer: Self-pay | Admitting: *Deleted

## 2023-07-14 NOTE — Telephone Encounter (Signed)
 No problems post procedure.

## 2023-07-15 ENCOUNTER — Other Ambulatory Visit: Payer: Self-pay | Admitting: Urology

## 2023-07-15 DIAGNOSIS — E291 Testicular hypofunction: Secondary | ICD-10-CM

## 2023-07-15 HISTORY — PX: HERNIA REPAIR: SHX51

## 2023-07-21 ENCOUNTER — Inpatient Hospital Stay

## 2023-07-21 ENCOUNTER — Encounter: Payer: Self-pay | Admitting: Licensed Clinical Social Worker

## 2023-07-21 ENCOUNTER — Inpatient Hospital Stay: Attending: Oncology | Admitting: Licensed Clinical Social Worker

## 2023-07-21 DIAGNOSIS — Z1379 Encounter for other screening for genetic and chromosomal anomalies: Secondary | ICD-10-CM

## 2023-07-21 DIAGNOSIS — Z800 Family history of malignant neoplasm of digestive organs: Secondary | ICD-10-CM | POA: Diagnosis not present

## 2023-07-21 NOTE — Progress Notes (Signed)
 REFERRING PROVIDER: Rhina Center, NP [phone] [facility] [address] [facility]- [place],  [place] [address]  PRIMARY PROVIDER:  Aileen Alexanders, NP  PRIMARY REASON FOR VISIT:  1. Family history of colon cancer   2. Family history of esophageal cancer      HISTORY OF PRESENT ILLNESS:   [name], a 58 y.o. male, was seen for a Darren Allen at the request of Darren Berber, NP due to a family history of cancer.  Darren Allen presents to clinic today to discuss the possibility of a hereditary predisposition to cancer, genetic testing, and to further clarify his future cancer risks, as well as potential cancer risks for family members.   CANCER HISTORY:   Darren Allen is a 58 y.o. male with no personal history of cancer.   He had a colonoscopy in 2023 that showed 5 polyps, tubular adenomas.   Past Medical History:  Diagnosis Date   Allergy    Asthma    Chronic lower back pain    a.) followed by pain management; on COT   COPD (chronic obstructive pulmonary disease) (HCC)    Coronary artery disease    a.) cCTA 06/30/2021: Ca2+ = 216 (84th %ile; 25049% pRCA and pLAD))   DDD (degenerative disc disease), lumbosacral    Depression    Diabetic peripheral neuropathy (HCC)    Diastolic dysfunction 12/27/2020   a.) TTE 12/27/2020: EF >55%, no RWMAs, G1DD, norm RVSF, triv MR/TR?PR   Diverticulosis    Erectile dysfunction    a.) on PDE5i (tadalafil )   GERD (gastroesophageal reflux disease)    Hepatic steatosis    History of kidney stones    Hyperlipidemia    Hypertension    Hypogonadism male    a.) on exogenous TRT (depotestosterone cypionate)   IDA (iron deficiency anemia)    Long term current use of opiate analgesic    a.) followed by pain management; naloxone  Rx available   OSA on CPAP    Paraesophageal hernia    a.) s/p robotic assisted repair 01/2022   PVD (peripheral vascular disease) (HCC)    T2DM (type 2 diabetes mellitus) (HCC)     Umbilical hernia    a.) s/p repair 01/20/2022   Ventral hernia     Past Surgical History:  Procedure Laterality Date   APPENDECTOMY     BRONCHOSCOPY  2016   CIRCUMCISION     COLONOSCOPY WITH PROPOFOL  N/A 12/31/2021   Procedure: COLONOSCOPY WITH PROPOFOL ;  Surgeon: Luke Salaam, MD;  Location: [facility] ENDOSCOPY;  Service: Gastroenterology;  Laterality: N/A;   ESOPHAGOGASTRODUODENOSCOPY N/A 12/31/2021   Procedure: ESOPHAGOGASTRODUODENOSCOPY (EGD);  Surgeon: Luke Salaam, MD;  Location: [facility];  Service: Gastroenterology;  Laterality: N/A;   INCISION AND DRAINAGE PERIRECTAL ABSCESS N/A 02/04/2015   Procedure: IRRIGATION AND DEBRIDEMENT PERIRECTAL ABSCESS;  Surgeon: Scherry Curtis, MD;  Location: [facility] ORS;  Service: General;  Laterality: N/A;   INSERTION OF MESH  01/20/2022   Procedure: INSERTION OF MESH;  Surgeon: Alben Alma, MD;  Location: [facility] ORS;  Service: General;;   INSERTION OF MESH N/A 04/08/2023   Procedure: INSERTION OF MESH;  Surgeon: Alben Alma, MD;  Location: [facility] ORS;  Service: General;  Laterality: N/A;   KIDNEY STONE SURGERY Right    lung mass removal N/A    MUSCLE BIOPSY     ORIF ANKLE FRACTURE Left 03/23/2017   Procedure: OPEN REDUCTION INTERNAL FIXATION (ORIF) ANKLE FRACTURE;  Surgeon: Lorri Rota, MD;  Location: [facility] ORS;  Service: Orthopedics;  Laterality: Left;   RECTAL EXAM UNDER ANESTHESIA  02/04/2015   Procedure: RECTAL EXAM UNDER ANESTHESIA;  Surgeon: Scherry Curtis, MD;  Location: [facility] ORS;  Service: General;;   SYNDESMOSIS REPAIR Left 03/23/2017   Procedure: SYNDESMOSIS REPAIR;  Surgeon: Lorri Rota, MD;  Location: [facility];  Service: Orthopedics;  Laterality: Left;   UMBILICAL HERNIA REPAIR  01/20/2022   Procedure: HERNIA REPAIR UMBILICAL ADULT;  Surgeon: Alben Alma, MD;  Location: [facility] OR[facility];  Service: General;;   VASECTOMY     VENTRAL HERNIA REPAIR N/A 04/08/2023   Procedure: HERNIA REPAIR VENTRAL ADULT, open;  Surgeon: Alben Alma, MD;  Location: [facility];  Service: General;  Laterality: N/A;   XI ROBOTIC ASSISTED PARAESOPHAGEAL HERNIA REPAIR N/A 01/20/2022   Procedure: XI ROBOTIC ASSISTED PARAESOPHAGEAL HERNIA REPAIR, CONVERTED TO OPEN, RNFA to assist;  Surgeon: Alben Alma, MD;  Location: [facility];  Service: General;  Laterality: N/A;    FAMILY HISTORY:  We obtained a detailed, 4-generation family history.  Significant diagnoses are listed below: Family History  Problem Relation Age of Onset   Cancer Mother 40       Lung   Cancer Father        Colon   Heart disease Father    Alcohol abuse Father    Cancer Brother 19       Esophageal   Diabetes Brother    Heart disease Brother    Darren Allen has 1 son, 45 and he had 1 daughter who passed at 50 of an overdose. He has 4 brothers. One had esophageal cancer at 74 and passed at 2.   Darren Allen mother passed of lung cancer at 22. A maternal aunt passed of lung cancer. There may be other cancers in other aunts/uncles, but patient is unsure types and has limited info. Maternal grandfather passed of cancer, unknown type, in his 65s.   Darren Allen father had lung cancer and colon cancer, he passed of colon cancer at 27. A paternal aunt had cancer, unknown type. No other known cancers on this side of the family.  Darren Allen is unaware of previous family history of genetic testing for hereditary cancer risks. There is no reported Ashkenazi Jewish ancestry. There is no known consanguinity.    GENETIC COUNSELING ASSESSMENT: Darren Allen is a 58 y.o. male with a family history which is not particularly suggestive of a hereditary cancer syndrome and predisposition to cancer. We, therefore, discussed and recommended the following at today's visit.   DISCUSSION: We discussed that approximately 10% of cancer is hereditary. Most cases of hereditary colon cancer are associated with Lynch genes, although there are other genes associated with hereditary cancer as well.  Cancers and risks are gene specific. Esophageal and lung cancer are typically not hereditary, and since his father's colon cancer was not diagnosed at a young age, he would not meet criteria for testing at this time. We discussed that testing is beneficial for several reasons including knowing about cancer risks, identifying potential screening and risk-reduction options that may be appropriate, and to understand if other family members could be at risk for cancer and allow them to undergo genetic testing.   We reviewed the characteristics, features and inheritance patterns of hereditary cancer syndromes. We also discussed genetic testing, including the appropriate family members to test, the process of testing, insurance coverage and turn-around-time for results. We discussed the implications of a negative, positive and/or variant of uncertain significant result.  We discussed with Darren Allen that the family history does not meet insurance or NCCN criteria for genetic testing and, therefore, is not highly consistent with a familial hereditary cancer syndrome.  We feel he is at low risk to harbor  a gene mutation associated with such a condition. Thus, we did not recommend any genetic testing, at this time, and recommended Darren Allen continue to follow the cancer screening guidelines given by his primary healthcare provider.Should he still want to have genetic testing done, he could pursue the [facility] CancerNext+RNA panel for a self-pay price of $250.  PLAN: After considering the risks, benefits, and limitations, Darren Allen did not wish to pursue genetic testing at today's visit. We understand this decision and remain available to coordinate genetic testing at any time in the future. We, therefore, recommend Darren Allen continue to follow the cancer screening guidelines given by his primary healthcare provider.  Darren Allen questions were answered to his satisfaction today. Our contact information was provided  should additional questions or concerns arise. Thank you for the referral and allowing us  to share in the care of your patient.   Valri Gee, MS, [facility] Genetic Counselor [email or web address] Phone: [phone]  45 minutes were spent on the date of the encounter in service to the patient including preparation, face-to-face Allen, documentation and care coordination. Dr. Nelson Bandy was available for discussion regarding this case.   _______________________________________________________________________ For Office Staff:  Number of people involved in session: 1 Was an Intern/ student involved with case: no

## 2023-07-26 ENCOUNTER — Ambulatory Visit
Admission: RE | Admit: 2023-07-26 | Discharge: 2023-07-26 | Disposition: A | Discharge status: Home / Self Care | Source: Ambulatory Visit | Attending: Student | Admitting: Student

## 2023-07-26 ENCOUNTER — Ambulatory Visit
Admission: RE | Admit: 2023-07-26 | Discharge: 2023-07-26 | Disposition: A | Discharge status: Home / Self Care | Source: Ambulatory Visit | Attending: Cardiology | Admitting: Cardiology

## 2023-07-26 ENCOUNTER — Other Ambulatory Visit: Payer: Self-pay | Admitting: Student

## 2023-07-26 DIAGNOSIS — R0602 Shortness of breath: Secondary | ICD-10-CM | POA: Diagnosis present

## 2023-07-26 DIAGNOSIS — R0609 Other forms of dyspnea: Secondary | ICD-10-CM | POA: Diagnosis not present

## 2023-07-26 DIAGNOSIS — R931 Abnormal findings on diagnostic imaging of heart and coronary circulation: Secondary | ICD-10-CM

## 2023-07-26 DIAGNOSIS — I2510 Atherosclerotic heart disease of native coronary artery without angina pectoris: Secondary | ICD-10-CM | POA: Diagnosis not present

## 2023-07-26 MED ORDER — NITROGLYCERIN 0.4 MG SL SUBL
0.8000 mg | SUBLINGUAL_TABLET | Freq: Once | SUBLINGUAL | Status: DC
  Filled 2023-07-26: qty 25

## 2023-07-26 MED ORDER — NITROGLYCERIN 0.4 MG SL SUBL
0.4000 mg | SUBLINGUAL_TABLET | Freq: Once | SUBLINGUAL | Status: AC
  Administered 2023-07-26: 0.4 mg via SUBLINGUAL
  Filled 2023-07-26: qty 25

## 2023-07-26 MED ORDER — METOPROLOL TARTRATE 5 MG/5ML IV SOLN
10.0000 mg | Freq: Once | INTRAVENOUS | Status: DC | PRN
  Filled 2023-07-26: qty 10

## 2023-07-26 MED ORDER — DILTIAZEM HCL 25 MG/5ML IV SOLN
10.0000 mg | INTRAVENOUS | Status: DC | PRN
  Filled 2023-07-26: qty 5

## 2023-07-26 MED ORDER — IOHEXOL 350 MG/ML SOLN
80.0000 mL | Freq: Once | INTRAVENOUS | Status: AC | PRN
  Administered 2023-07-26: 80 mL via INTRAVENOUS

## 2023-07-26 NOTE — Progress Notes (Signed)
Patient tolerated procedure well. W/C to lobby.  Ambulate w/o difficulty. Denies light headedness or being dizzy. Encouraged to drink extra water today and reasoning explained. Verbalized understanding. All questions answered. ABC intact. No further needs. Discharge from procedure area w/o issues.

## 2023-07-26 NOTE — Progress Notes (Signed)
 Front Information systems manager called ct and spoke with [place] ct tech and was told that he wasn't feeling good, pt thought maybe his sugar was dropping due to not eating pta. This Rn took pt some crackers and lemon lime soda, after eating a couple packs and drinking some soda pt states that he is feeling a little better, states he was uncertain if it was the effect of the heart rate medication or his sugar level

## 2023-07-28 ENCOUNTER — Emergency Department

## 2023-07-28 ENCOUNTER — Encounter: Payer: Self-pay | Admitting: Emergency Medicine

## 2023-07-28 ENCOUNTER — Other Ambulatory Visit: Payer: Self-pay

## 2023-07-28 ENCOUNTER — Observation Stay
Admission: EM | Admit: 2023-07-28 | Discharge: 2023-07-29 | Disposition: A | Discharge status: Home / Self Care | Attending: Student | Admitting: Student

## 2023-07-28 ENCOUNTER — Ambulatory Visit: Admission: EM | Admit: 2023-07-28 | Discharge: 2023-07-28

## 2023-07-28 DIAGNOSIS — Z79899 Other long term (current) drug therapy: Secondary | ICD-10-CM | POA: Diagnosis not present

## 2023-07-28 DIAGNOSIS — Z794 Long term (current) use of insulin: Secondary | ICD-10-CM | POA: Insufficient documentation

## 2023-07-28 DIAGNOSIS — Z7984 Long term (current) use of oral hypoglycemic drugs: Secondary | ICD-10-CM | POA: Insufficient documentation

## 2023-07-28 DIAGNOSIS — I2510 Atherosclerotic heart disease of native coronary artery without angina pectoris: Secondary | ICD-10-CM | POA: Diagnosis not present

## 2023-07-28 DIAGNOSIS — Z87891 Personal history of nicotine dependence: Secondary | ICD-10-CM | POA: Insufficient documentation

## 2023-07-28 DIAGNOSIS — L03311 Cellulitis of abdominal wall: Secondary | ICD-10-CM | POA: Diagnosis not present

## 2023-07-28 DIAGNOSIS — I10 Essential (primary) hypertension: Secondary | ICD-10-CM | POA: Diagnosis not present

## 2023-07-28 DIAGNOSIS — E1140 Type 2 diabetes mellitus with diabetic neuropathy, unspecified: Secondary | ICD-10-CM | POA: Insufficient documentation

## 2023-07-28 DIAGNOSIS — K432 Incisional hernia without obstruction or gangrene: Secondary | ICD-10-CM

## 2023-07-28 DIAGNOSIS — L02211 Cutaneous abscess of abdominal wall: Principal | ICD-10-CM

## 2023-07-28 DIAGNOSIS — J449 Chronic obstructive pulmonary disease, unspecified: Secondary | ICD-10-CM | POA: Diagnosis not present

## 2023-07-28 DIAGNOSIS — R109 Unspecified abdominal pain: Secondary | ICD-10-CM | POA: Diagnosis present

## 2023-07-28 LAB — CBC
HCT: 36.4 % — ABNORMAL LOW (ref 39.0–52.0)
Hemoglobin: 11.2 g/dL — ABNORMAL LOW (ref 13.0–17.0)
MCH: 23.9 pg — ABNORMAL LOW (ref 26.0–34.0)
MCHC: 30.8 g/dL (ref 30.0–36.0)
MCV: 77.8 fL — ABNORMAL LOW (ref 80.0–100.0)
Platelets: 236 10*3/uL (ref 150–400)
RBC: 4.68 MIL/uL (ref 4.22–5.81)
RDW: 16.2 % — ABNORMAL HIGH (ref 11.5–15.5)
WBC: 7 10*3/uL (ref 4.0–10.5)
nRBC: 0 % (ref 0.0–0.2)

## 2023-07-28 LAB — URINALYSIS, ROUTINE W REFLEX MICROSCOPIC
Bilirubin Urine: NEGATIVE
Glucose, UA: 150 mg/dL — AB
Hgb urine dipstick: NEGATIVE
Ketones, ur: NEGATIVE mg/dL
Leukocytes,Ua: NEGATIVE
Nitrite: NEGATIVE
Protein, ur: NEGATIVE mg/dL
Specific Gravity, Urine: 1.031 — ABNORMAL HIGH (ref 1.005–1.030)
pH: 6 (ref 5.0–8.0)

## 2023-07-28 LAB — COMPREHENSIVE METABOLIC PANEL WITH GFR
ALT: 38 U/L (ref 0–44)
AST: 34 U/L (ref 15–41)
Albumin: 4 g/dL (ref 3.5–5.0)
Alkaline Phosphatase: 46 U/L (ref 38–126)
Anion gap: 7 (ref 5–15)
BUN: 19 mg/dL (ref 6–20)
CO2: 25 mmol/L (ref 22–32)
Calcium: 8.9 mg/dL (ref 8.9–10.3)
Chloride: 105 mmol/L (ref 98–111)
Creatinine, Ser: 1.26 mg/dL — ABNORMAL HIGH (ref 0.61–1.24)
GFR, Estimated: >60 mL/min (ref >60)
Glucose, Bld: 142 mg/dL — ABNORMAL HIGH (ref 70–99)
Potassium: 3.7 mmol/L (ref 3.5–5.1)
Sodium: 137 mmol/L (ref 135–145)
Total Bilirubin: 0.7 mg/dL (ref 0.0–1.2)
Total Protein: 7.1 g/dL (ref 6.5–8.1)

## 2023-07-28 LAB — LACTIC ACID, PLASMA
Lactic Acid, Venous: 1.1 mmol/L (ref 0.5–1.9)
Lactic Acid, Venous: 1.4 mmol/L (ref 0.5–1.9)

## 2023-07-28 LAB — GLUCOSE, CAPILLARY: Glucose-Capillary: 130 mg/dL — ABNORMAL HIGH (ref 70–99)

## 2023-07-28 LAB — CBG MONITORING, ED: Glucose-Capillary: 187 mg/dL — ABNORMAL HIGH (ref 70–99)

## 2023-07-28 LAB — LIPASE, BLOOD: Lipase: 43 U/L (ref 11–51)

## 2023-07-28 MED ORDER — ENOXAPARIN SODIUM 40 MG/0.4ML IJ SOSY: 40.0000 mg | PREFILLED_SYRINGE | INTRAMUSCULAR | Status: DC

## 2023-07-28 MED ORDER — VANCOMYCIN HCL 500 MG/100ML IV SOLN
500.0000 mg | Freq: Once | INTRAVENOUS | Status: AC
  Administered 2023-07-28: 500 mg via INTRAVENOUS
  Filled 2023-07-28 (×2): qty 100

## 2023-07-28 MED ORDER — PIPERACILLIN-TAZOBACTAM 3.375 G IVPB
3.3750 g | Freq: Three times a day (TID) | INTRAVENOUS | Status: DC
  Administered 2023-07-28 – 2023-07-29 (×2): 3.375 g via INTRAVENOUS
  Filled 2023-07-28 (×2): qty 50

## 2023-07-28 MED ORDER — BUDESON-GLYCOPYRROL-FORMOTEROL 160-9-4.8 MCG/ACT IN AERO
2.0000 | INHALATION_SPRAY | Freq: Two times a day (BID) | RESPIRATORY_TRACT | Status: DC
  Administered 2023-07-28 – 2023-07-29 (×2): 2 via RESPIRATORY_TRACT
  Filled 2023-07-28: qty 5.9

## 2023-07-28 MED ORDER — LISINOPRIL 10 MG PO TABS
10.0000 mg | ORAL_TABLET | Freq: Every day | ORAL | Status: DC
Start: 2023-07-29 — End: 2023-07-29

## 2023-07-28 MED ORDER — ONDANSETRON HCL 4 MG PO TABS: 4.0000 mg | ORAL_TABLET | Freq: Four times a day (QID) | ORAL | Status: DC | PRN

## 2023-07-28 MED ORDER — VANCOMYCIN HCL 500 MG/100ML IV SOLN
500.0000 mg | INTRAVENOUS | Status: DC
Start: 2023-07-29 — End: 2023-07-29
  Filled 2023-07-28: qty 100

## 2023-07-28 MED ORDER — HYDROCODONE-ACETAMINOPHEN 5-325 MG PO TABS
1.0000 | ORAL_TABLET | Freq: Two times a day (BID) | ORAL | Status: DC | PRN
  Administered 2023-07-28 – 2023-07-29 (×2): 1 via ORAL
  Filled 2023-07-28 (×2): qty 1

## 2023-07-28 MED ORDER — LIDOCAINE-EPINEPHRINE 1 %-1:100000 IJ SOLN
20.0000 mL | Freq: Once | INTRAMUSCULAR | Status: AC
  Administered 2023-07-28: 20 mL via INTRADERMAL
  Filled 2023-07-28: qty 20

## 2023-07-28 MED ORDER — PANTOPRAZOLE SODIUM 40 MG PO TBEC
40.0000 mg | DELAYED_RELEASE_TABLET | Freq: Two times a day (BID) | ORAL | Status: DC
  Administered 2023-07-28 – 2023-07-29 (×2): 40 mg via ORAL
  Filled 2023-07-28 (×2): qty 1

## 2023-07-28 MED ORDER — HYDROCODONE-ACETAMINOPHEN 5-325 MG PO TABS
1.0000 | ORAL_TABLET | Freq: Once | ORAL | Status: AC
  Administered 2023-07-28: 1 via ORAL
  Filled 2023-07-28: qty 1

## 2023-07-28 MED ORDER — INSULIN GLARGINE 100 UNIT/ML ~~LOC~~ SOLN
15.0000 [IU] | Freq: Every day | SUBCUTANEOUS | Status: DC
Start: 2023-07-28 — End: 2023-07-29
  Administered 2023-07-28: 15 [IU] via SUBCUTANEOUS
  Filled 2023-07-28 (×2): qty 0.15

## 2023-07-28 MED ORDER — IPRATROPIUM-ALBUTEROL 0.5-2.5 (3) MG/3ML IN SOLN: 3.0000 mL | RESPIRATORY_TRACT | Status: DC | PRN

## 2023-07-28 MED ORDER — INSULIN ASPART 100 UNIT/ML IJ SOLN
0.0000 [IU] | Freq: Three times a day (TID) | INTRAMUSCULAR | Status: DC
  Administered 2023-07-29: 4 [IU] via SUBCUTANEOUS
  Filled 2023-07-28: qty 1

## 2023-07-28 MED ORDER — AMITRIPTYLINE HCL 10 MG PO TABS
30.0000 mg | ORAL_TABLET | Freq: Every day | ORAL | Status: DC
Start: 2023-07-28 — End: 2023-07-29
  Administered 2023-07-28: 30 mg via ORAL
  Filled 2023-07-28: qty 3

## 2023-07-28 MED ORDER — ALBUTEROL SULFATE (2.5 MG/3ML) 0.083% IN NEBU: 2.5000 mg | INHALATION_SOLUTION | Freq: Four times a day (QID) | RESPIRATORY_TRACT | Status: DC | PRN

## 2023-07-28 MED ORDER — VANCOMYCIN HCL 2000 MG/400ML IV SOLN
2000.0000 mg | Freq: Once | INTRAVENOUS | Status: AC
  Administered 2023-07-28: 2000 mg via INTRAVENOUS
  Filled 2023-07-28 (×2): qty 400

## 2023-07-28 MED ORDER — SODIUM CHLORIDE 0.9 % IV SOLN
1.0000 g | Freq: Once | INTRAVENOUS | Status: AC
  Administered 2023-07-28: 1 g via INTRAVENOUS
  Filled 2023-07-28: qty 10

## 2023-07-28 MED ORDER — HYDROMORPHONE HCL 1 MG/ML IJ SOLN
0.5000 mg | INTRAMUSCULAR | Status: DC | PRN
  Administered 2023-07-28 – 2023-07-29 (×2): 0.5 mg via INTRAVENOUS
  Filled 2023-07-28 (×2): qty 0.5

## 2023-07-28 MED ORDER — VANCOMYCIN HCL 2000 MG/400ML IV SOLN
2000.0000 mg | INTRAVENOUS | Status: DC
  Filled 2023-07-28: qty 400

## 2023-07-28 MED ORDER — GABAPENTIN 300 MG PO CAPS
600.0000 mg | ORAL_CAPSULE | Freq: Three times a day (TID) | ORAL | Status: DC
  Administered 2023-07-28 – 2023-07-29 (×2): 600 mg via ORAL
  Filled 2023-07-28 (×2): qty 2

## 2023-07-28 MED ORDER — INSULIN ASPART 100 UNIT/ML IJ SOLN
0.0000 [IU] | Freq: Every day | INTRAMUSCULAR | Status: DC
Start: 2023-07-28 — End: 2023-07-29

## 2023-07-28 MED ORDER — ROSUVASTATIN CALCIUM 20 MG PO TABS
40.0000 mg | ORAL_TABLET | Freq: Every day | ORAL | Status: DC
  Administered 2023-07-28: 40 mg via ORAL
  Filled 2023-07-28: qty 2

## 2023-07-28 MED ORDER — IOHEXOL 300 MG/ML  SOLN
100.0000 mL | Freq: Once | INTRAMUSCULAR | Status: AC | PRN
  Administered 2023-07-28: 100 mL via INTRAVENOUS

## 2023-07-28 MED ORDER — DIPHENHYDRAMINE HCL 50 MG/ML IJ SOLN
25.0000 mg | Freq: Once | INTRAMUSCULAR | Status: AC
  Administered 2023-07-28: 25 mg via INTRAVENOUS
  Filled 2023-07-28: qty 1

## 2023-07-28 MED ORDER — FLUTICASONE PROPIONATE 50 MCG/ACT NA SUSP: 1.0000 | Freq: Every day | NASAL | Status: DC | PRN

## 2023-07-28 MED ORDER — TERBINAFINE HCL 250 MG PO TABS
250.0000 mg | ORAL_TABLET | Freq: Every day | ORAL | Status: DC
  Administered 2023-07-29: 250 mg via ORAL
  Filled 2023-07-28: qty 1

## 2023-07-28 MED ORDER — DAPAGLIFLOZIN PROPANEDIOL 10 MG PO TABS
10.0000 mg | ORAL_TABLET | Freq: Every day | ORAL | Status: DC
  Administered 2023-07-29: 10 mg via ORAL
  Filled 2023-07-28: qty 1

## 2023-07-28 MED ORDER — MONTELUKAST SODIUM 10 MG PO TABS: 10.0000 mg | ORAL_TABLET | Freq: Every day | ORAL | Status: DC

## 2023-07-28 MED ORDER — ONDANSETRON HCL 4 MG/2ML IJ SOLN: 4.0000 mg | Freq: Four times a day (QID) | INTRAMUSCULAR | Status: DC | PRN

## 2023-07-28 MED ORDER — IPRATROPIUM BROMIDE 0.06 % NA SOLN
2.0000 | Freq: Four times a day (QID) | NASAL | Status: DC
  Administered 2023-07-28: 2 via NASAL
  Filled 2023-07-28: qty 15

## 2023-07-28 MED ORDER — SODIUM CHLORIDE 0.9 % IV SOLN: 2.0000 g | Freq: Three times a day (TID) | INTRAVENOUS | Status: DC

## 2023-07-28 MED ORDER — HYDROCHLOROTHIAZIDE 25 MG PO TABS: 25.0000 mg | ORAL_TABLET | Freq: Every day | ORAL | Status: DC

## 2023-07-28 MED ORDER — VANCOMYCIN HCL 10 G IV SOLR: 2500.0000 mg | INTRAVENOUS | Status: DC

## 2023-07-28 MED ORDER — ENOXAPARIN SODIUM 60 MG/0.6ML IJ SOSY
0.5000 mg/kg | PREFILLED_SYRINGE | INTRAMUSCULAR | Status: DC
  Administered 2023-07-28: 55 mg via SUBCUTANEOUS
  Filled 2023-07-28: qty 0.6

## 2023-07-28 NOTE — ED Triage Notes (Addendum)
 Pt states had hernia surgery in Jan 2025 is c/o soreness,redness & tenderness around incision site. Started noticing sx's 3 days ago. Having some nausea today & diarrhea yesterday.

## 2023-07-28 NOTE — ED Notes (Signed)
 Patient is being discharged from the Urgent Care and sent to the Emergency Department via POV . Per Cleave Curling PA, patient is in need of higher level of care due to cellulitis & incisional hernia. Patient is aware and verbalizes understanding of plan of care.  Vitals:   07/28/23 0904  BP: 124/79  Pulse: 86  Resp: 16  Temp: 98.4 F (36.9 C)  SpO2: 97%

## 2023-07-28 NOTE — ED Notes (Signed)
Informed RN bed assigned 

## 2023-07-28 NOTE — Progress Notes (Signed)
 PHARMACIST - PHYSICIAN COMMUNICATION  CONCERNING:  Enoxaparin  (Lovenox ) for DVT Prophylaxis    RECOMMENDATION: Patient was prescribed enoxaprin 40mg  q24 hours for VTE prophylaxis.   Filed Weights   07/28/23 1036  Weight: 112 kg (247 lb)    Body mass index is 31.71 kg/m.  Estimated Creatinine Clearance: 83 mL/min (A) (by C-G formula based on SCr of 1.26 mg/dL (H)).   Based on [facility] policy patient is candidate for enoxaparin  0.5mg /kg TBW SQ every 24 hours based on BMI being >30.  DESCRIPTION: Pharmacy has adjusted enoxaparin  dose per [facility] policy.  Patient is now receiving enoxaparin  55 mg every 24 hours    Alice Innocent, PharmD Clinical Pharmacist  07/28/2023 2:17 PM

## 2023-07-28 NOTE — Discharge Instructions (Addendum)
-  You need abdominal imaging to assess for abscess, sepsis, strangulated or incarcerated hernia.  -Go to ER now.  You have been advised to follow up immediately in the emergency department for concerning signs.symptoms. If you declined EMS transport, please have a family member take you directly to the ED at this time. Do not delay. Based on concerns about condition, if you do not follow up in th e ED, you may risk poor outcomes including worsening of condition, delayed treatment and potentially life threatening issues. If you have declined to go to the ED at this time, you should call your PCP immediately to set up a follow up appointment.  Go to ED for red flag symptoms, including; fevers you cannot reduce with Tylenol /Motrin, severe headaches, vision changes, numbness/weakness in part of the body, lethargy, confusion, intractable vomiting, severe dehydration, chest pain, breathing difficulty, severe persistent abdominal or pelvic pain, signs of severe infection (increased redness, swelling of an area), feeling faint or passing out, dizziness, etc. You should especially go to the ED for sudden acute worsening of condition if you do not elect to go at this time.

## 2023-07-28 NOTE — ED Provider Notes (Signed)
 [facility] Provider Note   Event Date/Time   First MD Initiated Contact with Patient 07/28/23 1109     (approximate) History  Hernia  HPI Darren Allen is a 58 y.o. male with a past medical history of abdominal incisional hernia repair, chronic back pain on pain contract, type 2 diabetes, and hypertension who presents complaining of of redness overlying the anterior hernia repair site of his abdomen.  Patient states that when he presses on it he can feel it inside of his abdomen and causes 2/10 pain.  Patient denies any exacerbating factors.  Patient denies any fever.  Patient denies any insect bites, skin tear, or laceration overlying the site prior to the redness beginning. ROS: Patient currently denies any vision changes, tinnitus, difficulty speaking, facial droop, sore throat, chest pain, shortness of breath, nausea/vomiting/diarrhea, dysuria, or weakness/numbness/paresthesias in any extremity   Physical Exam  Triage Vital Signs: ED Triage Vitals [07/28/23 1036]  Encounter Vitals Group     BP 125/73     Systolic BP Percentile      Diastolic BP Percentile      Pulse Rate 85     Resp 17     Temp 98.3 F (36.8 C)     Temp Source Oral     SpO2 98 %     Weight 247 lb (112 kg)     Height 6\' 2"  (1.88 m)     Head Circumference      Peak Flow      Pain Score 2     Pain Loc      Pain Education      Exclude from Growth Chart    Most recent vital signs: Vitals:   07/28/23 1846 07/28/23 2006  BP: 132/73 125/76  Pulse: 81 80  Resp: 16 16  Temp: 97.6 F (36.4 C) 98.4 F (36.9 C)  SpO2: 99% 98%   General: Awake, oriented x4. CV:  Good peripheral perfusion.  Resp:  Normal effort.  Abd:  No distention.  Midline abdominal incision well-healed with an area of approximately 10 x 12 cm of erythema, induration, tenderness to palpation Other:  Middle-aged obese Caucasian male resting comfortably in no acute distress ED Results / Procedures / Treatments   Labs (all labs ordered are listed, but only abnormal results are displayed) Labs Reviewed  COMPREHENSIVE METABOLIC PANEL WITH GFR - Abnormal; Notable for the following components:      Result Value   Glucose, Bld 142 (*)    Creatinine, Ser 1.26 (*)    All other components within normal limits  CBC - Abnormal; Notable for the following components:   Hemoglobin 11.2 (*)    HCT 36.4 (*)    MCV 77.8 (*)    MCH 23.9 (*)    RDW 16.2 (*)    All other components within normal limits  URINALYSIS, ROUTINE W REFLEX MICROSCOPIC - Abnormal; Notable for the following components:   Color, Urine AMBER (*)    APPearance HAZY (*)    Specific Gravity, Urine 1.031 (*)    Glucose, UA 150 (*)    All other components within normal limits  CREATININE, SERUM - Abnormal; Notable for the following components:   Creatinine, Ser 1.38 (*)    GFR, Estimated 59 (*)    All other components within normal limits  GLUCOSE, CAPILLARY - Abnormal; Notable for the following components:   Glucose-Capillary 130 (*)    All other components within normal limits  CBG MONITORING, ED -  Abnormal; Notable for the following components:   Glucose-Capillary 187 (*)    All other components within normal limits  AEROBIC/ANAEROBIC CULTURE W GRAM STAIN (SURGICAL/DEEP WOUND)  MRSA NEXT GEN BY PCR, NASAL  LIPASE, BLOOD  LACTIC ACID, PLASMA  LACTIC ACID, PLASMA  HIV ANTIBODY (ROUTINE TESTING W REFLEX)   RADIOLOGY ED MD interpretation: CT of the abdomen pelvis with IV contrast independently interpreted and shows infected collections or abscesses in the anterior abdomen along the hernia repair site -Agree with radiology assessment Official radiology report(s): CT ABDOMEN PELVIS W CONTRAST Result Date: 07/28/2023 CLINICAL DATA:  Abdominal pain. Erythema over the hernia repair site. EXAM: CT ABDOMEN AND PELVIS WITH CONTRAST TECHNIQUE: Multidetector CT imaging of the abdomen and pelvis was performed using the standard protocol  following bolus administration of intravenous contrast. RADIATION DOSE REDUCTION: This exam was performed according to the departmental dose-optimization program which includes automated exposure control, adjustment of the mA and/or kV according to patient size and/or use of iterative reconstruction technique. CONTRAST:  OMNIPAQUE  IOHEXOL  300 MG/ML  SOLN COMPARISON:  CT abdomen pelvis dated 03/04/2023. FINDINGS: Lower chest: The visualized lung bases are clear. No intra-abdominal free air or free fluid. Hepatobiliary: The liver is unremarkable. No biliary dilatation. The gallbladder is unremarkable Pancreas: Unremarkable. No pancreatic ductal dilatation or surrounding inflammatory changes. Spleen: Normal in size without focal abnormality. Adrenals/Urinary Tract: The adrenal glands are unremarkable. There is no hydronephrosis on either side. There is symmetric enhancement and excretion of contrast by both kidneys. There is a 4 mm stone along the left posterior bladder wall at the ureterovesical junction. The urinary bladder is unremarkable. Stomach/Bowel: There is sigmoid diverticulosis. There is no bowel obstruction or active inflammation. Appendectomy. Vascular/Lymphatic: Moderate aortoiliac atherosclerotic disease. The IVC is unremarkable. No portal venous gas. There is no adenopathy. Reproductive: The prostate and seminal vesicles are grossly unremarkable. No pelvic mass. Other: There is a 1.5 x 4.0 cm loculated collection containing air and fluid in the anterior abdomen along the hernia repair. It is difficult to determine whether this collection is superficial or deep to the hernia repair. Additional similar sized collection in the subcutaneous soft tissues at the same level with surrounding inflammatory changes. These two collections likely communicate with each other. Musculoskeletal: No acute osseous pathology. IMPRESSION: 1. Infected collections or abscesses in the anterior abdomen along the hernia  repair as above. 2. A 4 mm stone along the left posterior bladder wall at the ureterovesical junction. No hydronephrosis. 3. Sigmoid diverticulosis. No bowel obstruction. 4.  Aortic Atherosclerosis (ICD10-I70.0). Electronically Signed   By: Angus Bark M.D.   On: 07/28/2023 12:35   PROCEDURES: Critical Care performed: No Procedures MEDICATIONS ORDERED IN ED: Medications  HYDROcodone -acetaminophen  (NORCO/VICODIN) 5-325 MG per tablet 1 tablet (1 tablet Oral Given 07/28/23 1843)  HYDROmorphone  (DILAUDID ) injection 0.5 mg (0.5 mg Intravenous Given 07/29/23 0257)  terbinafine  (LAMISIL ) tablet 250 mg (has no administration in time range)  hydrochlorothiazide  (HYDRODIURIL ) tablet 25 mg (has no administration in time range)  lisinopril  (ZESTRIL ) tablet 10 mg (has no administration in time range)  rosuvastatin  (CRESTOR ) tablet 40 mg (40 mg Oral Given 07/28/23 2134)  amitriptyline  (ELAVIL ) tablet 30 mg (30 mg Oral Given 07/28/23 2135)  dapagliflozin  propanediol (FARXIGA ) tablet 10 mg (has no administration in time range)  pantoprazole  (PROTONIX ) EC tablet 40 mg (40 mg Oral Given 07/28/23 2134)  gabapentin  (NEURONTIN ) capsule 600 mg (600 mg Oral Given 07/28/23 2133)  budesonide -glycopyrrolate -formoterol  (BREZTRI) 160-9-4.8 MCG/ACT inhaler 2 puff (2 puffs Inhalation Given  07/28/23 2137)  fluticasone  (FLONASE ) 50 MCG/ACT nasal spray 1 spray (has no administration in time range)  ipratropium (ATROVENT ) 0.06 % nasal spray 2 spray (2 sprays Each Nare Given 07/28/23 2136)  ipratropium-albuterol  (DUONEB) 0.5-2.5 (3) MG/3ML nebulizer solution 3 mL (has no administration in time range)  montelukast  (SINGULAIR ) tablet 10 mg (has no administration in time range)  albuterol  (PROVENTIL ) (2.5 MG/3ML) 0.083% nebulizer solution 2.5 mg (has no administration in time range)  insulin  aspart (novoLOG ) injection 0-20 Units ( Subcutaneous Not Given 07/28/23 1813)  insulin  aspart (novoLOG ) injection 0-5 Units ( Subcutaneous Not  Given 07/28/23 2137)  ondansetron  (ZOFRAN ) tablet 4 mg (has no administration in time range)    Or  ondansetron  (ZOFRAN ) injection 4 mg (has no administration in time range)  enoxaparin  (LOVENOX ) injection 55 mg (55 mg Subcutaneous Given 07/28/23 2135)  insulin  glargine (LANTUS ) injection 15 Units (15 Units Subcutaneous Given 07/28/23 2134)  vancomycin  (VANCOREADY) IVPB 2000 mg/400 mL (has no administration in time range)    And  vancomycin  (VANCOREADY) IVPB 500 mg/100 mL (has no administration in time range)  piperacillin -tazobactam (ZOSYN ) IVPB 3.375 g (3.375 g Intravenous Not Given 07/29/23 0216)  iohexol  (OMNIPAQUE ) 300 MG/ML solution 100 mL (100 mLs Intravenous Contrast Given 07/28/23 1211)  cefTRIAXone  (ROCEPHIN ) 1 g in sodium chloride  0.9 % 100 mL IVPB (0 g Intravenous Stopped 07/28/23 1419)  lidocaine -EPINEPHrine  (XYLOCAINE  W/EPI) 1 %-1:100000 (with pres) injection 20 mL (20 mLs Intradermal Given 07/28/23 1419)  HYDROcodone -acetaminophen  (NORCO/VICODIN) 5-325 MG per tablet 1 tablet (1 tablet Oral Given 07/28/23 1352)  diphenhydrAMINE  (BENADRYL ) injection 25 mg (25 mg Intravenous Given 07/28/23 1352)  vancomycin  (VANCOREADY) IVPB 2000 mg/400 mL (0 mg Intravenous Stopped 07/28/23 1800)  vancomycin  (VANCOREADY) IVPB 500 mg/100 mL (500 mg Intravenous Transfusing/Transfer 07/28/23 1836)   IMPRESSION / MDM / ASSESSMENT AND PLAN / ED COURSE  I reviewed the triage vital signs and the nursing notes.                             The patient is on the cardiac monitor to evaluate for evidence of arrhythmia and/or significant heart rate changes. Patient's presentation is most consistent with acute presentation with potential threat to life or bodily function. Presentation most consistent with complex abscess. Given History, Exam, and Workup I have low suspicion for Necrotizing Fasciitis, Pyomyositis, Sporotrichosis, Osteomyelitis or other emergent problem as a cause for this presentation. Interventions:  Spoke with Okey Berg and surgery who will attempt bedside I&D however recommends admission to internal medicine  Disposition: Admit to medicine   FINAL CLINICAL IMPRESSION(S) / ED DIAGNOSES   Final diagnoses:  Abdominal wall abscess   Rx / DC Orders   ED Discharge Orders     None      Note:  This document was prepared using Dragon voice recognition software and may include unintentional dictation errors.   Deidra Spease K, MD 07/29/23 [phone]

## 2023-07-28 NOTE — H&P (Signed)
 History and Physical    TRACY PASLEY EXB:[id number] DOB: September 25, 1965 DOA: 07/28/2023  PCP: Aileen Alexanders, NP (Confirm with patient/family/NH records and if not entered, this has to be entered at [facility] point of entry) Patient coming from: Home  I have personally briefly reviewed patient's old medical records in [facility]  Chief Complaint: Rash and swelling on belly  HPI: Darren Allen is a 58 y.o. male with medical history significant of IDDM, HTN, HLD, diabetic neuropathy, asthma/COPD, presented with worsening of abdominal wall swelling rash and pain.  Symptoms started 2 days ago.  January this year, patient underwent ventral hernia repair and the surgical site feeding was good.  2 days ago, patient started to have a rash of the abdominal wall surgical scar site, associated with sweating and tenderness.  Symptoms getting worse yesterday evening, and this morning he had 1 episode of feeling nausea and diarrhea.  And he has noticed significant enlargement of the the swelling site and rash.  Denies any fever or chills.  ED Course: Afebrile, nontachycardic blood pressure 125/73 O2 saturation 98% on room air.  CT abdomen pelvis showed infected collections or abscesses in the anterior abdominal along the hernia repair.  Blood work showed WBC 7.0, hemoglobin 11.2, BUN 19 creatinine 1.2.  Patient was given vancomycin  ceftriaxone  and Flagyl  in the ED.  Review of Systems: As per HPI otherwise 14 point review of systems negative.    Past Medical History:  Diagnosis Date   Allergy    Asthma    Chronic lower back pain    a.) followed by pain management; on COT   COPD (chronic obstructive pulmonary disease) (HCC)    Coronary artery disease    a.) cCTA 06/30/2021: Ca2+ = 216 (84th %ile; 25049% pRCA and pLAD))   DDD (degenerative disc disease), lumbosacral    Depression    Diabetic peripheral neuropathy (HCC)    Diastolic dysfunction 12/27/2020   a.) TTE 12/27/2020: EF >55%, no  RWMAs, G1DD, norm RVSF, triv MR/TR?PR   Diverticulosis    Erectile dysfunction    a.) on PDE5i (tadalafil )   GERD (gastroesophageal reflux disease)    Hepatic steatosis    History of kidney stones    Hyperlipidemia    Hypertension    Hypogonadism male    a.) on exogenous TRT (depotestosterone cypionate)   IDA (iron deficiency anemia)    Long term current use of opiate analgesic    a.) followed by pain management; naloxone  Rx available   OSA on CPAP    Paraesophageal hernia    a.) s/p robotic assisted repair 01/2022   PVD (peripheral vascular disease) (HCC)    T2DM (type 2 diabetes mellitus) (HCC)    Umbilical hernia    a.) s/p repair 01/20/2022   Ventral hernia     Past Surgical History:  Procedure Laterality Date   APPENDECTOMY     BRONCHOSCOPY  2016   CIRCUMCISION     COLONOSCOPY WITH PROPOFOL  N/A 12/31/2021   Procedure: COLONOSCOPY WITH PROPOFOL ;  Surgeon: Luke Salaam, MD;  Location: [facility] ENDOSCOPY;  Service: Gastroenterology;  Laterality: N/A;   ESOPHAGOGASTRODUODENOSCOPY N/A 12/31/2021   Procedure: ESOPHAGOGASTRODUODENOSCOPY (EGD);  Surgeon: Luke Salaam, MD;  Location: [facility] ENDOSCOPY;  Service: Gastroenterology;  Laterality: N/A;   INCISION AND DRAINAGE PERIRECTAL ABSCESS N/A 02/04/2015   Procedure: IRRIGATION AND DEBRIDEMENT PERIRECTAL ABSCESS;  Surgeon: Scherry Curtis, MD;  Location: [facility] ORS;  Service: General;  Laterality: N/A;   INSERTION OF MESH  01/20/2022   Procedure: INSERTION  OF MESH;  Surgeon: Alben Alma, MD;  Location: [facility] ORS;  Service: General;;   INSERTION OF MESH N/A 04/08/2023   Procedure: INSERTION OF MESH;  Surgeon: Alben Alma, MD;  Location: [facility] ORS;  Service: General;  Laterality: N/A;   KIDNEY STONE SURGERY Right    lung mass removal N/A    MUSCLE BIOPSY     ORIF ANKLE FRACTURE Left 03/23/2017   Procedure: OPEN REDUCTION INTERNAL FIXATION (ORIF) ANKLE FRACTURE;  Surgeon: Lorri Rota, MD;  Location: [facility] ORS;  Service: Orthopedics;   Laterality: Left;   RECTAL EXAM UNDER ANESTHESIA  02/04/2015   Procedure: RECTAL EXAM UNDER ANESTHESIA;  Surgeon: Scherry Curtis, MD;  Location: [facility] ORS;  Service: General;;   SYNDESMOSIS REPAIR Left 03/23/2017   Procedure: SYNDESMOSIS REPAIR;  Surgeon: Lorri Rota, MD;  Location: [facility] ORS;  Service: Orthopedics;  Laterality: Left;   UMBILICAL HERNIA REPAIR  01/20/2022   Procedure: HERNIA REPAIR UMBILICAL ADULT;  Surgeon: Alben Alma, MD;  Location: [facility] ORS;  Service: General;;   VASECTOMY     VENTRAL HERNIA REPAIR N/A 04/08/2023   Procedure: HERNIA REPAIR VENTRAL ADULT, open;  Surgeon: Alben Alma, MD;  Location: [facility] ORS;  Service: General;  Laterality: N/A;   XI ROBOTIC ASSISTED PARAESOPHAGEAL HERNIA REPAIR N/A 01/20/2022   Procedure: XI ROBOTIC ASSISTED PARAESOPHAGEAL HERNIA REPAIR, CONVERTED TO OPEN, RNFA to assist;  Surgeon: Alben Alma, MD;  Location: [facility] ORS;  Service: General;  Laterality: N/A;     reports that he has quit smoking. His smoking use included cigarettes. He has a 17 pack-year smoking history. He has been exposed to tobacco smoke. He uses smokeless tobacco. He reports that he does not currently use alcohol after a past usage of about 12.0 standard drinks of alcohol per week. He reports current drug use. Drug: Hydrocodone .  Allergies  Allergen Reactions   Glipizide Other (See Comments) and Palpitations    Shaky, feel bad Other reaction(s): Dizziness   Cephalexin  Rash   Duloxetine Anxiety and Nausea Only   Dupixent [Dupilumab] Rash    Family History  Problem Relation Age of Onset   Lung cancer Mother 61   Colon cancer Father 68   Heart disease Father    Alcohol abuse Father    Esophageal cancer Brother 64   Diabetes Brother    Heart disease Brother    Lung cancer Maternal Aunt    Cancer Paternal Aunt        unk type   Cancer Maternal Grandfather        unk type    Prior to Admission medications   Medication Sig Start Date End Date  Taking? Authorizing Provider  amitriptyline  (ELAVIL ) 10 MG tablet Take 30 mg by mouth at bedtime.    [provider]  ascorbic acid (VITAMIN C) 500 MG tablet Take 500 mg by mouth daily.    [provider]  Budeson-Glycopyrrol-Formoterol  (BREZTRI AEROSPHERE) 160-9-4.8 MCG/ACT AERO Inhale 2 puffs into the lungs in the morning and at bedtime.    [provider]  clotrimazole -betamethasone  (LOTRISONE ) cream Apply 1 Application topically daily. 03/29/23   Aileen Alexanders, NP  cyanocobalamin (VITAMIN B12) 1000 MCG tablet Take 1,000 mcg by mouth daily.    [provider]  dapagliflozin  propanediol (FARXIGA ) 10 MG TABS tablet Take 1 tablet (10 mg total) by mouth daily before breakfast. 02/25/23   Aileen Alexanders, NP  ferrous sulfate  325 (65 FE) MG tablet Take 325 mg by mouth daily with breakfast.  [provider]  fluticasone  (FLONASE ) 50 MCG/ACT nasal spray Place 1 spray into both nostrils daily as needed for allergies. 07/02/21   [provider]  gabapentin  (NEURONTIN ) 600 MG tablet Take 600 mg by mouth 3 (three) times daily. 03/26/21   [provider]  hydrochlorothiazide  (HYDRODIURIL ) 25 MG tablet Take 1 tablet (25 mg total) by mouth daily. 02/10/23   Aileen Alexanders, NP  HYDROcodone -acetaminophen  (NORCO/VICODIN) 5-325 MG tablet Take 1 tablet by mouth 2 (two) times daily as needed. Must last 30 days. 06/21/23 07/21/23  Renaldo Caroli, MD  HYDROcodone -acetaminophen  (NORCO/VICODIN) 5-325 MG tablet Take 1 tablet by mouth 2 (two) times daily as needed. Must last 30 days. 07/21/23 08/20/23  Renaldo Caroli, MD  insulin  isophane & regular human (HUMULIN 70/30 KWIKPEN) (70-30) 100 UNIT/ML KwikPen Inject 42-82 Units into the skin 2 (two) times daily with a meal. 82 in the a.m. and 42u qhs 10/14/19   [provider]  ipratropium (ATROVENT ) 0.06 % nasal spray Place 2 sprays into both nostrils 4 (four) times daily. 05/12/23   Brimage, Vondra,  DO  ipratropium-albuterol  (DUONEB) 0.5-2.5 (3) MG/3ML SOLN Take 3 mLs by nebulization every 4 (four) hours as needed (SOB/Wheezing).    [provider]  lisinopril  (ZESTRIL ) 10 MG tablet Take 1 tablet (10 mg total) by mouth daily. 02/25/23   Aileen Alexanders, NP  Magnesium  Oxide -Mg Supplement 500 MG TABS Take 500 mg by mouth daily. 06/27/22   [provider]  metFORMIN  (GLUCOPHAGE -XR) 500 MG 24 hr tablet TAKE 4 TABLETS BY MOUTH DAILY 07/06/23   Aileen Alexanders, NP  metoprolol  tartrate (LOPRESSOR ) 25 MG tablet Take 2 doses (50 mg total) by mouth at bedtime the night before your exam and take 2 doses (50 mg total) by mouth the morning of your heart CT exam. 06/09/23 06/08/24  [provider]  metroNIDAZOLE  (METROGEL ) 1 % gel Apply topically daily. qhs to face 06/03/23   Harris Liming, MD  montelukast  (SINGULAIR ) 10 MG tablet Take 10 mg by mouth daily. 07/02/21   [provider]  naloxone  (NARCAN ) nasal spray 4 mg/0.1 mL Place 1 spray into the nose as needed for up to 365 doses (for opioid-induced respiratory depresssion). In case of emergency (overdose), spray once into each nostril. If no response within 3 minutes, repeat application and call 911. 02/17/23 02/17/24  Renaldo Caroli, MD  NEEDLE, DISP, 18 G (BD SAFETYGLIDE NEEDLE) 18G X 1-1/2" MISC 1 mg by Does not apply route every 7 (seven) days. 07/05/23   Matilde Son A, PA-C  NEEDLE, DISP, 21 G (BD SAFETYGLIDE NEEDLE) 21G X 1" MISC 1 mg by Does not apply route every 7 (seven) days. 07/05/23   Matilde Son A, PA-C  pantoprazole  (PROTONIX ) 40 MG tablet Take 40 mg by mouth 2 (two) times daily. 03/22/23   [provider]  PRESCRIPTION MEDICATION Pt has CPAP Machine    [provider]  PROAIR  HFA 108 (90 Base) MCG/ACT inhaler Inhale 2 puffs into the lungs every 6 (six) hours as needed for wheezing or shortness of breath. 12/28/20   [provider]  rosuvastatin  (CRESTOR ) 40 MG tablet  TAKE 1 TABLET(40 MG) BY MOUTH DAILY 09/21/22   Aileen Alexanders, NP  tazarotene  (AVAGE ) 0.1 % cream Apply topically at bedtime. qhs to face and chest for acne 06/03/23   Harris Liming, MD  terbinafine  (LAMISIL ) 250 MG tablet Take 1 tablet (250 mg total) by mouth daily. 06/03/23 08/26/23  Harris Liming, MD  testosterone  cypionate (DEPOTESTOSTERONE CYPIONATE) 200  MG/ML injection Inject 1 cc every 10 days. 07/16/23   Matilde Son A, PA-C  tirzepatide (MOUNJARO) 7.5 MG/0.5ML Pen Inject 7.5 mg into the skin once a week.    [provider]    Physical Exam: Vitals:   07/28/23 1036  BP: 125/73  Pulse: 85  Resp: 17  Temp: 98.3 F (36.8 C)  TempSrc: Oral  SpO2: 98%  Weight: 112 kg  Height: 6\' 2"  (1.88 m)    Constitutional: NAD, calm, comfortable Vitals:   07/28/23 1036  BP: 125/73  Pulse: 85  Resp: 17  Temp: 98.3 F (36.8 C)  TempSrc: Oral  SpO2: 98%  Weight: 112 kg  Height: 6\' 2"  (1.88 m)   Eyes: PERRL, lids and conjunctivae normal ENMT: Mucous membranes are moist. Posterior pharynx clear of any exudate or lesions.Normal dentition.  Neck: normal, supple, no masses, no thyromegaly Respiratory: clear to auscultation bilaterally, no wheezing, no crackles. Normal respiratory effort. No accessory muscle use.  Cardiovascular: Regular rate and rhythm, no murmurs / rubs / gallops. No extremity edema. 2+ pedal pulses. No carotid bruits.  Abdomen: no tenderness, no masses palpated. No hepatosplenomegaly. Bowel sounds positive.  Macular rash on abdominal wall along the lower one third of the surgical incisions scar, with induration on the mid third area with significant tenderness on palpation Musculoskeletal: no clubbing / cyanosis. No joint deformity upper and lower extremities. Good ROM, no contractures. Normal muscle tone.  Skin: no rashes, lesions, ulcers. No induration Neurologic: CN 2-12 grossly intact. Sensation intact, DTR normal. Strength 5/5 in all 4.   Psychiatric: Normal judgment and insight. Alert and oriented x 3. Normal mood.     Labs on Admission: I have personally reviewed following labs and imaging studies  CBC: Recent Labs  Lab 07/28/23 1039  WBC 7.0  HGB 11.2*  HCT 36.4*  MCV 77.8*  PLT 236   Basic Metabolic Panel: Recent Labs  Lab 07/28/23 1039  NA 137  K 3.7  CL 105  CO2 25  GLUCOSE 142*  BUN 19  CREATININE 1.26*  CALCIUM  8.9   GFR: Estimated Creatinine Clearance: 83 mL/min (A) (by C-G formula based on SCr of 1.26 mg/dL (H)). Liver Function Tests: Recent Labs  Lab 07/28/23 1039  AST 34  ALT 38  ALKPHOS 46  BILITOT 0.7  PROT 7.1  ALBUMIN  4.0   Recent Labs  Lab 07/28/23 1039  LIPASE 43   No results for input(s): "AMMONIA" in the last 168 hours. Coagulation Profile: No results for input(s): "INR", "PROTIME" in the last 168 hours. Cardiac Enzymes: No results for input(s): "CKTOTAL", "CKMB", "CKMBINDEX", "TROPONINI" in the last 168 hours. BNP (last 3 results) No results for input(s): "PROBNP" in the last 8760 hours. HbA1C: No results for input(s): "HGBA1C" in the last 72 hours. CBG: No results for input(s): "GLUCAP" in the last 168 hours. Lipid Profile: No results for input(s): "CHOL", "HDL", "LDLCALC", "TRIG", "CHOLHDL", "LDLDIRECT" in the last 72 hours. Thyroid Function Tests: No results for input(s): "TSH", "T4TOTAL", "FREET4", "T3FREE", "THYROIDAB" in the last 72 hours. Anemia Panel: No results for input(s): "VITAMINB12", "FOLATE", "FERRITIN", "TIBC", "IRON", "RETICCTPCT" in the last 72 hours. Urine analysis:    Component Value Date/Time   COLORURINE AMBER (A) 07/28/2023 1228   APPEARANCEUR HAZY (A) 07/28/2023 1228   APPEARANCEUR Clear 02/25/2023 1025   LABSPEC 1.031 (H) 07/28/2023 1228   LABSPEC 1.031 03/15/2012 0110   PHURINE 6.0 07/28/2023 1228   GLUCOSEU 150 (A) 07/28/2023 1228   GLUCOSEU Negative 03/15/2012 0110  HGBUR NEGATIVE 07/28/2023 1228   BILIRUBINUR NEGATIVE  07/28/2023 1228   BILIRUBINUR Negative 02/25/2023 1025   BILIRUBINUR Negative 03/15/2012 0110   KETONESUR NEGATIVE 07/28/2023 1228   PROTEINUR NEGATIVE 07/28/2023 1228   NITRITE NEGATIVE 07/28/2023 1228   LEUKOCYTESUR NEGATIVE 07/28/2023 1228   LEUKOCYTESUR Negative 03/15/2012 0110    Radiological Exams on Admission: CT ABDOMEN PELVIS W CONTRAST Result Date: 07/28/2023 CLINICAL DATA:  Abdominal pain. Erythema over the hernia repair site. EXAM: CT ABDOMEN AND PELVIS WITH CONTRAST TECHNIQUE: Multidetector CT imaging of the abdomen and pelvis was performed using the standard protocol following bolus administration of intravenous contrast. RADIATION DOSE REDUCTION: This exam was performed according to the departmental dose-optimization program which includes automated exposure control, adjustment of the mA and/or kV according to patient size and/or use of iterative reconstruction technique. CONTRAST:  OMNIPAQUE  IOHEXOL  300 MG/ML  SOLN COMPARISON:  CT abdomen pelvis dated 03/04/2023. FINDINGS: Lower chest: The visualized lung bases are clear. No intra-abdominal free air or free fluid. Hepatobiliary: The liver is unremarkable. No biliary dilatation. The gallbladder is unremarkable Pancreas: Unremarkable. No pancreatic ductal dilatation or surrounding inflammatory changes. Spleen: Normal in size without focal abnormality. Adrenals/Urinary Tract: The adrenal glands are unremarkable. There is no hydronephrosis on either side. There is symmetric enhancement and excretion of contrast by both kidneys. There is a 4 mm stone along the left posterior bladder wall at the ureterovesical junction. The urinary bladder is unremarkable. Stomach/Bowel: There is sigmoid diverticulosis. There is no bowel obstruction or active inflammation. Appendectomy. Vascular/Lymphatic: Moderate aortoiliac atherosclerotic disease. The IVC is unremarkable. No portal venous gas. There is no adenopathy. Reproductive: The prostate and  seminal vesicles are grossly unremarkable. No pelvic mass. Other: There is a 1.5 x 4.0 cm loculated collection containing air and fluid in the anterior abdomen along the hernia repair. It is difficult to determine whether this collection is superficial or deep to the hernia repair. Additional similar sized collection in the subcutaneous soft tissues at the same level with surrounding inflammatory changes. These two collections likely communicate with each other. Musculoskeletal: No acute osseous pathology. IMPRESSION: 1. Infected collections or abscesses in the anterior abdomen along the hernia repair as above. 2. A 4 mm stone along the left posterior bladder wall at the ureterovesical junction. No hydronephrosis. 3. Sigmoid diverticulosis. No bowel obstruction. 4.  Aortic Atherosclerosis (ICD10-I70.0). Electronically Signed   By: Angus Bark M.D.   On: 07/28/2023 12:35    EKG: None  Assessment/Plan Principal Problem:   Abdominal wall abscess  (please populate well all problems here in Problem List. (For example, if patient is on BP meds at home and you resume or decide to hold them, it is a problem that needs to be her. Same for CAD, COPD, HLD and so on)  Abdominal wall cellulitis and abscess formation - General Surgeon consulted, plan is for bedside I&D and culture this afternoon -CT abdomen pelvis with contrast result reviewed, appears to have no involvement of intra-abdominal organs and no peritonitis on physical exam - Continue broad-spectrum antibiotics, will cover MRSA and Pseudomonas with vancomycin  and cefepime - Check MRSA screening  IDDM - Change 70/30 regimen to Lantus  15 units daily - SSI - Hold off metformin   HTN - Stable, continue HCTZ and lisinopril   Diabetic neuropathy - Stable, continue gabapentin   COPD - Stable, continue as needed albuterol , ICS and LABA  DVT prophylaxis: Lovenox  Code Status: Full code Family Communication: None at bedside Disposition Plan:  Expect less than 2 midnight hospital  stay Consults called: General Surgeon Admission status: Surgical observation   [place] MD [facility] Pager [phone]  07/28/2023, 2:33 PM

## 2023-07-28 NOTE — ED Triage Notes (Signed)
 Patient to ED via POV from UC for incisional hernia. Recently had one removed in Jan but has returned and now red and swollen. Denies fevers at home but having nausea and bilateral leg weakness.

## 2023-07-28 NOTE — Consult Note (Signed)
 [facility] SURGICAL CONSULTATION NOTE (initial) - cpt: 99254   HISTORY OF PRESENT ILLNESS (HPI):  58 y.o. male presented to [facility] ED today for evaluation of erythema to the abdominal wall. Patient reports the acute onset of erythema and swelling to his abdominal wall for the last few days. He denied any inciting events or injuries to the area. No reported fever, chills, emesis, bowel changes, or urinary changes. He does have a history of ventral hernia repair in Jan 2025 in this area. He initially presented to urgent care; however, given the erythema and previous hernia repair, he was referred to the ED for evaluation. Work up in the ED revealed a normal WBC at 7.0K, Hgb to 11.2, sCr near baseline 1.6, venous lactate was normal. He did have CT Abdomen/Pelvis which was concerning for small fluid collection to the anterior abdominal wall. He was given rocephin  in the ED.   Surgery is consulted by emergency medicine physician Dr. Iven Mark, MD in this context for evaluation and management of cellulitis and abscess to the anterior abdominal wall.  PAST MEDICAL HISTORY (PMH):  Past Medical History:  Diagnosis Date   Allergy    Asthma    Chronic lower back pain    a.) followed by pain management; on COT   COPD (chronic obstructive pulmonary disease) (HCC)    Coronary artery disease    a.) cCTA 06/30/2021: Ca2+ = 216 (84th %ile; 25049% pRCA and pLAD))   DDD (degenerative disc disease), lumbosacral    Depression    Diabetic peripheral neuropathy (HCC)    Diastolic dysfunction 12/27/2020   a.) TTE 12/27/2020: EF >55%, no RWMAs, G1DD, norm RVSF, triv MR/TR?PR   Diverticulosis    Erectile dysfunction    a.) on PDE5i (tadalafil )   GERD (gastroesophageal reflux disease)    Hepatic steatosis    History of kidney stones    Hyperlipidemia    Hypertension    Hypogonadism male    a.) on exogenous TRT (depotestosterone cypionate)   IDA (iron deficiency anemia)    Long term current  use of opiate analgesic    a.) followed by pain management; naloxone  Rx available   OSA on CPAP    Paraesophageal hernia    a.) s/p robotic assisted repair 01/2022   PVD (peripheral vascular disease) (HCC)    T2DM (type 2 diabetes mellitus) (HCC)    Umbilical hernia    a.) s/p repair 01/20/2022   Ventral hernia      PAST SURGICAL HISTORY (PSH):  Past Surgical History:  Procedure Laterality Date   APPENDECTOMY     BRONCHOSCOPY  2016   CIRCUMCISION     COLONOSCOPY WITH PROPOFOL  N/A 12/31/2021   Procedure: COLONOSCOPY WITH PROPOFOL ;  Surgeon: Luke Salaam, MD;  Location: [facility] ENDOSCOPY;  Service: Gastroenterology;  Laterality: N/A;   ESOPHAGOGASTRODUODENOSCOPY N/A 12/31/2021   Procedure: ESOPHAGOGASTRODUODENOSCOPY (EGD);  Surgeon: Luke Salaam, MD;  Location: [facility] ENDOSCOPY;  Service: Gastroenterology;  Laterality: N/A;   INCISION AND DRAINAGE PERIRECTAL ABSCESS N/A 02/04/2015   Procedure: IRRIGATION AND DEBRIDEMENT PERIRECTAL ABSCESS;  Surgeon: Scherry Curtis, MD;  Location: [facility] ORS;  Service: General;  Laterality: N/A;   INSERTION OF MESH  01/20/2022   Procedure: INSERTION OF MESH;  Surgeon: Alben Alma, MD;  Location: [facility] ORS;  Service: General;;   INSERTION OF MESH N/A 04/08/2023   Procedure: INSERTION OF MESH;  Surgeon: Alben Alma, MD;  Location: [facility] ORS;  Service: General;  Laterality: N/A;   KIDNEY STONE SURGERY Right  lung mass removal N/A    MUSCLE BIOPSY     ORIF ANKLE FRACTURE Left 03/23/2017   Procedure: OPEN REDUCTION INTERNAL FIXATION (ORIF) ANKLE FRACTURE;  Surgeon: Lorri Rota, MD;  Location: [facility] ORS;  Service: Orthopedics;  Laterality: Left;   RECTAL EXAM UNDER ANESTHESIA  02/04/2015   Procedure: RECTAL EXAM UNDER ANESTHESIA;  Surgeon: Scherry Curtis, MD;  Location: [facility] ORS;  Service: General;;   SYNDESMOSIS REPAIR Left 03/23/2017   Procedure: SYNDESMOSIS REPAIR;  Surgeon: Lorri Rota, MD;  Location: [facility] ORS;  Service: Orthopedics;   Laterality: Left;   UMBILICAL HERNIA REPAIR  01/20/2022   Procedure: HERNIA REPAIR UMBILICAL ADULT;  Surgeon: Alben Alma, MD;  Location: [facility] ORS;  Service: General;;   VASECTOMY     VENTRAL HERNIA REPAIR N/A 04/08/2023   Procedure: HERNIA REPAIR VENTRAL ADULT, open;  Surgeon: Alben Alma, MD;  Location: [facility] ORS;  Service: General;  Laterality: N/A;   XI ROBOTIC ASSISTED PARAESOPHAGEAL HERNIA REPAIR N/A 01/20/2022   Procedure: XI ROBOTIC ASSISTED PARAESOPHAGEAL HERNIA REPAIR, CONVERTED TO OPEN, RNFA to assist;  Surgeon: Alben Alma, MD;  Location: [facility] ORS;  Service: General;  Laterality: N/A;     MEDICATIONS:  Prior to Admission medications   Medication Sig Start Date End Date Taking? Authorizing Provider  amitriptyline  (ELAVIL ) 10 MG tablet Take 30 mg by mouth at bedtime.    [provider]  ascorbic acid (VITAMIN C) 500 MG tablet Take 500 mg by mouth daily.    [provider]  Budeson-Glycopyrrol-Formoterol  (BREZTRI AEROSPHERE) 160-9-4.8 MCG/ACT AERO Inhale 2 puffs into the lungs in the morning and at bedtime.    [provider]  clotrimazole -betamethasone  (LOTRISONE ) cream Apply 1 Application topically daily. 03/29/23   Aileen Alexanders, NP  cyanocobalamin (VITAMIN B12) 1000 MCG tablet Take 1,000 mcg by mouth daily.    [provider]  dapagliflozin  propanediol (FARXIGA ) 10 MG TABS tablet Take 1 tablet (10 mg total) by mouth daily before breakfast. 02/25/23   Aileen Alexanders, NP  ferrous sulfate  325 (65 FE) MG tablet Take 325 mg by mouth daily with breakfast.    [provider]  fluticasone  (FLONASE ) 50 MCG/ACT nasal spray Place 1 spray into both nostrils daily as needed for allergies. 07/02/21   [provider]  gabapentin  (NEURONTIN ) 600 MG tablet Take 600 mg by mouth 3 (three) times daily. 03/26/21   [provider]  hydrochlorothiazide  (HYDRODIURIL ) 25 MG tablet Take 1 tablet (25 mg total) by mouth daily. 02/10/23    Aileen Alexanders, NP  HYDROcodone -acetaminophen  (NORCO/VICODIN) 5-325 MG tablet Take 1 tablet by mouth 2 (two) times daily as needed. Must last 30 days. 06/21/23 07/21/23  Renaldo Caroli, MD  HYDROcodone -acetaminophen  (NORCO/VICODIN) 5-325 MG tablet Take 1 tablet by mouth 2 (two) times daily as needed. Must last 30 days. 07/21/23 08/20/23  Renaldo Caroli, MD  insulin  isophane & regular human (HUMULIN 70/30 KWIKPEN) (70-30) 100 UNIT/ML KwikPen Inject 42-82 Units into the skin 2 (two) times daily with a meal. 82 in the a.m. and 42u qhs 10/14/19   [provider]  ipratropium (ATROVENT ) 0.06 % nasal spray Place 2 sprays into both nostrils 4 (four) times daily. 05/12/23   Brimage, Vondra, DO  ipratropium-albuterol  (DUONEB) 0.5-2.5 (3) MG/3ML SOLN Take 3 mLs by nebulization every 4 (four) hours as needed (SOB/Wheezing).    [provider]  lisinopril  (ZESTRIL ) 10 MG tablet Take 1 tablet (10 mg total) by mouth daily. 02/25/23   Aileen Alexanders, NP  Magnesium  Oxide -Mg  Supplement 500 MG TABS Take 500 mg by mouth daily. 06/27/22   [provider]  metFORMIN  (GLUCOPHAGE -XR) 500 MG 24 hr tablet TAKE 4 TABLETS BY MOUTH DAILY 07/06/23   Aileen Alexanders, NP  metoprolol  tartrate (LOPRESSOR ) 25 MG tablet Take 2 doses (50 mg total) by mouth at bedtime the night before your exam and take 2 doses (50 mg total) by mouth the morning of your heart CT exam. 06/09/23 06/08/24  [provider]  metroNIDAZOLE  (METROGEL ) 1 % gel Apply topically daily. qhs to face 06/03/23   Harris Liming, MD  montelukast  (SINGULAIR ) 10 MG tablet Take 10 mg by mouth daily. 07/02/21   [provider]  naloxone  (NARCAN ) nasal spray 4 mg/0.1 mL Place 1 spray into the nose as needed for up to 365 doses (for opioid-induced respiratory depresssion). In case of emergency (overdose), spray once into each nostril. If no response within 3 minutes, repeat application and call 911. 02/17/23 02/17/24  Renaldo Caroli, MD  NEEDLE, DISP, 18 G (BD SAFETYGLIDE NEEDLE) 18G X 1-1/2" MISC 1 mg by Does not apply route every 7 (seven) days. 07/05/23   Matilde Son A, PA-C  NEEDLE, DISP, 21 G (BD SAFETYGLIDE NEEDLE) 21G X 1" MISC 1 mg by Does not apply route every 7 (seven) days. 07/05/23   Matilde Son A, PA-C  pantoprazole  (PROTONIX ) 40 MG tablet Take 40 mg by mouth 2 (two) times daily. 03/22/23   [provider]  PRESCRIPTION MEDICATION Pt has CPAP Machine    [provider]  PROAIR  HFA 108 (90 Base) MCG/ACT inhaler Inhale 2 puffs into the lungs every 6 (six) hours as needed for wheezing or shortness of breath. 12/28/20   [provider]  rosuvastatin  (CRESTOR ) 40 MG tablet TAKE 1 TABLET(40 MG) BY MOUTH DAILY 09/21/22   Aileen Alexanders, NP  tazarotene  (AVAGE ) 0.1 % cream Apply topically at bedtime. qhs to face and chest for acne 06/03/23   Harris Liming, MD  terbinafine  (LAMISIL ) 250 MG tablet Take 1 tablet (250 mg total) by mouth daily. 06/03/23 08/26/23  Harris Liming, MD  testosterone  cypionate (DEPOTESTOSTERONE CYPIONATE) 200 MG/ML injection Inject 1 cc every 10 days. 07/16/23   Matilde Son A, PA-C  tirzepatide (MOUNJARO) 7.5 MG/0.5ML Pen Inject 7.5 mg into the skin once a week.    [provider]     ALLERGIES:  Allergies  Allergen Reactions   Glipizide Other (See Comments) and Palpitations    Shaky, feel bad Other reaction(s): Dizziness   Cephalexin  Rash   Duloxetine Anxiety and Nausea Only   Dupixent [Dupilumab] Rash     SOCIAL HISTORY:  Social History   Socioeconomic History   Marital status: Single    Spouse name: Not on file   Number of children: Not on file   Years of education: Not on file   Highest education level: 8th grade  Occupational History   Not on file  Tobacco Use   Smoking status: Former    Current packs/day: 0.50    Average packs/day: 0.5 packs/day for 34.0 years (17.0 ttl pk-yrs)    Types: Cigarettes    Passive  exposure: Past   Smokeless tobacco: Current  Vaping Use   Vaping status: Some Days   Substances: Nicotine   Substance and Sexual Activity   Alcohol use: Not Currently    Alcohol/week: 12.0 standard drinks of alcohol    Types: 12 Cans of beer per week    Comment: Quit February 2023   Drug use: Yes  Types: Hydrocodone    Sexual activity: Yes  Other Topics Concern   Not on file  Social History Narrative   Not on file   Social Drivers of Health   Financial Resource Strain: Low Risk  (06/07/2023)   Overall Financial Resource Strain (CARDIA)    Difficulty of Paying Living Expenses: Not hard at all  Food Insecurity: No Food Insecurity (06/07/2023)   Hunger Vital Sign    Worried About Running Out of Food in the Last Year: Never true    Ran Out of Food in the Last Year: Never true  Transportation Needs: No Transportation Needs (06/07/2023)   PRAPARE - Administrator, Civil Service (Medical): No    Lack of Transportation (Non-Medical): No  Physical Activity: Inactive (06/07/2023)   Exercise Vital Sign    Days of Exercise per Week: 0 days    Minutes of Exercise per Session: 0 min  Stress: No Stress Concern Present (06/07/2023)   [employer] of Occupational Health - Occupational Stress Questionnaire    Feeling of Stress : Only a little  Social Connections: Socially Isolated (06/07/2023)   Social Connection and Isolation Panel [NHANES]    Frequency of Communication with Friends and Family: Three times a week    Frequency of Social Gatherings with Friends and Family: Once a week    Attends Religious Services: Never    Database administrator or Organizations: No    Attends Banker Meetings: Never    Marital Status: Divorced  Catering manager Violence: Not At Risk (06/07/2023)   Humiliation, Afraid, Rape, and Kick questionnaire    Fear of Current or Ex-Partner: No    Emotionally Abused: No    Physically Abused: No    Sexually Abused: No     FAMILY HISTORY:   Family History  Problem Relation Age of Onset   Lung cancer Mother 19   Colon cancer Father 20   Heart disease Father    Alcohol abuse Father    Esophageal cancer Brother 3   Diabetes Brother    Heart disease Brother    Lung cancer Maternal Aunt    Cancer Paternal Aunt        unk type   Cancer Maternal Grandfather        unk type      REVIEW OF SYSTEMS:  Review of Systems  Constitutional:  Negative for chills and fever.  HENT:  Negative for congestion and sore throat.   Cardiovascular:  Negative for chest pain and palpitations.  Gastrointestinal:  Positive for abdominal pain. Negative for vomiting.  Genitourinary:  Negative for dysuria and urgency.  Skin:  Negative for itching and rash.       + Erythema (abdominal wall)  All other systems reviewed and are negative.   VITAL SIGNS:  Temp:  [98.3 F (36.8 C)-98.4 F (36.9 C)] 98.3 F (36.8 C) (05/14 1036) Pulse Rate:  [85-86] 85 (05/14 1036) Resp:  [16-17] 17 (05/14 1036) BP: (124-125)/(73-79) 125/73 (05/14 1036) SpO2:  [97 %-98 %] 98 % (05/14 1036) Weight:  [841 kg] 112 kg (05/14 1036)     Height: 6\' 2"  (188 cm) Weight: 112 kg BMI (Calculated): 31.7   INTAKE/OUTPUT:  No intake/output data recorded.  PHYSICAL EXAM:  Physical Exam Vitals and nursing note reviewed. Exam conducted with a chaperone present.  Constitutional:      General: He is not in acute distress.    Appearance: Normal appearance. He is obese. He is not ill-appearing.  Comments: Resting in bed; NAD  HENT:     Head: Normocephalic and atraumatic.  Eyes:     General: No scleral icterus.    Conjunctiva/sclera: Conjunctivae normal.  Cardiovascular:     Rate and Rhythm: Normal rate.     Pulses: Normal pulses.  Pulmonary:     Effort: Pulmonary effort is normal. No respiratory distress.  Abdominal:     General: A surgical scar is present. There is no distension.     Palpations: Abdomen is soft.     Tenderness: There is abdominal tenderness in  the periumbilical area. There is no guarding or rebound.     Comments: Abdomen is soft, there is an area of tenderness overlying midline scar and area of erythema, non-distended, no rebound/guarding   Genitourinary:    Comments: Deferred Skin:    General: Skin is warm and dry.     Findings: Erythema present.     Comments: Abdominal wall is erythematous, there is central induration, no drainage.   Neurological:     General: No focal deficit present.     Mental Status: He is alert and oriented to person, place, and time.  Psychiatric:        Mood and Affect: Mood normal.        Behavior: Behavior normal.      Labs:     Latest Ref Rng & Units 07/28/2023   10:39 AM 02/25/2023   10:26 AM 02/23/2023    1:44 PM  CBC  WBC 4.0 - 10.5 K/uL 7.0  7.0    Hemoglobin 13.0 - 17.0 g/dL 60.4  54.0  98.1   Hematocrit 39.0 - 52.0 % 36.4  39.6  39.3   Platelets 150 - 400 K/uL 236  272        Latest Ref Rng & Units 07/28/2023   10:39 AM 02/25/2023   10:26 AM 02/10/2023    2:17 PM  CMP  Glucose 70 - 99 mg/dL 191  478  295   BUN 6 - 20 mg/dL 19  13  12    Creatinine 0.61 - 1.24 mg/dL 6.21  3.08  6.57   Sodium 135 - 145 mmol/L 137  137  138   Potassium 3.5 - 5.1 mmol/L 3.7  3.5  3.7   Chloride 98 - 111 mmol/L 105  96  98   CO2 22 - 32 mmol/L 25  23  21    Calcium  8.9 - 10.3 mg/dL 8.9  9.8  9.6   Total Protein 6.5 - 8.1 g/dL 7.1  6.9  6.9   Total Bilirubin 0.0 - 1.2 mg/dL 0.7  0.5  0.3   Alkaline Phos 38 - 126 U/L 46  59  60   AST 15 - 41 U/L 34  34  50   ALT 0 - 44 U/L 38  39  46     Imaging studies:   CT Abdomen/Pelvis (07/28/2023) personally reviewed with fluid collection to the anterior abdominal wall, concerning for abscess, no evidence of hernia recurrence, and radiologist report reviewed below:  IMPRESSION: 1. Infected collections or abscesses in the anterior abdomen along the hernia repair as above. 2. A 4 mm stone along the left posterior bladder wall at the ureterovesical  junction. No hydronephrosis. 3. Sigmoid diverticulosis. No bowel obstruction. 4.  Aortic Atherosclerosis (ICD10-I70.0).   Assessment/Plan:  58 y.o. male with abdominal wall cellulitis and underlying abscess   - Would appreciate medicine admission for IV Abx given his comorbid conditions in this setting  -  We will attempt bedside I&D as this fluid appears to be superficial and suspect these areas on CT communicate. Will document separately  - He can have a diet  - Continue IV Abx; will obtained Cx  - Pain control prn  - Okay to mobilize as tolerated    All of the above findings and recommendations were discussed with the patient and his family (wife at bedside), and all of patient's and his family's questions were answered to his expressed satisfaction.  Thank you for the opportunity to participate in this patient's care.   -- [name], PA-C [facility] 07/28/2023, 1:34 PM M-F: 7am - 4pm

## 2023-07-28 NOTE — Consult Note (Signed)
 PHARMACY - BRIEF ANTIBIOTIC NOTE   Pharmacy has received consult(s) for vancomycin  from an ED provider. The patient's profile has been reviewed for ht/wt/allergies/indication/available labs.    One time order(s) placed for: vancomycin  2500mg   Further antibiotics/pharmacy consults should be ordered by admitting physician if indicated.                       Thank you,  Will M. Alva Jewels, PharmD Clinical Pharmacist 07/28/2023 2:10 PM

## 2023-07-28 NOTE — ED Provider Notes (Signed)
 [facility]    CSN: [id number] Arrival date & time: 07/28/23  0853      History   Chief Complaint Chief Complaint  Patient presents with   Nausea   Diarrhea    HPI Darren Allen is a 58 y.o. male with history of asthma, allergies, CAD, COPD, diverticulosis, chronic pain (on narcotics), T2DM, GERD, HTN, HLD, ventral, umbilical and incisional hernias.   Patient reports 3 day history of redness, swelling, warmth and increased pain around incisional/ventral hernia. Today he notes having nausea and diarrhea. Denies fever, fatigue, changes in appetite. No associated vomiting, black or bloody stool. Patient reports having a surgery of the incisional hernia back in January of this year. He says he thinks he had an infection at that time.   HPI  Past Medical History:  Diagnosis Date   Allergy    Asthma    Chronic lower back pain    a.) followed by pain management; on COT   COPD (chronic obstructive pulmonary disease) (HCC)    Coronary artery disease    a.) cCTA 06/30/2021: Ca2+ = 216 (84th %ile; 25049% pRCA and pLAD))   DDD (degenerative disc disease), lumbosacral    Depression    Diabetic peripheral neuropathy (HCC)    Diastolic dysfunction 12/27/2020   a.) TTE 12/27/2020: EF >55%, no RWMAs, G1DD, norm RVSF, triv MR/TR?PR   Diverticulosis    Erectile dysfunction    a.) on PDE5i (tadalafil )   GERD (gastroesophageal reflux disease)    Hepatic steatosis    History of kidney stones    Hyperlipidemia    Hypertension    Hypogonadism male    a.) on exogenous TRT (depotestosterone cypionate)   IDA (iron deficiency anemia)    Long term current use of opiate analgesic    a.) followed by pain management; naloxone  Rx available   OSA on CPAP    Paraesophageal hernia    a.) s/p robotic assisted repair 01/2022   PVD (peripheral vascular disease) (HCC)    T2DM (type 2 diabetes mellitus) (HCC)    Umbilical hernia    a.) s/p repair 01/20/2022   Ventral hernia      Patient Active Problem List   Diagnosis Date Noted   Low back pain of over 3 months duration 07/13/2023   Mechanical low back pain 07/13/2023   Recurrent low back pain 07/13/2023   DDD (degenerative disc disease), lumbosacral w/ LBP 05/25/2023   Incisional hernia, without obstruction or gangrene 04/08/2023   Lumbar facet joint pain 02/17/2023   Spondylosis without myelopathy or radiculopathy, lumbosacral region 02/17/2023   Chronic lower extremity pain (intermittent) (Right) 12/24/2022   Chronic lower extremity pain (intermittent) (Left) 12/24/2022   Lumbar radiculitis (Right) 12/24/2022   Lumbar radiculitis (Left) 12/24/2022   Boil of buttock 12/24/2022   Chronic low back pain (Bilateral) w/ sciatica (Right) 12/23/2022   Acne vulgaris 09/24/2022   Chronic use of opiate for therapeutic purpose 08/24/2022   Abnormal MRI, thoracic spine (07/05/2020) 06/09/2022   Coronary artery disease involving native coronary artery of native heart without angina pectoris 05/06/2022   Chronic low back pain (Bilateral) w/ sciatica (Left) 04/02/2022   Advanced care planning/counseling discussion 02/20/2022   Hypotension due to drugs 02/04/2022   Elevated lipase 02/04/2022   Iron deficiency anemia 02/04/2022   Hyperkalemia 01/21/2022   S/P repair of paraesophageal hernia 01/20/2022   Umbilical hernia without obstruction and without gangrene 01/20/2022   Hiatal hernia    Adenomatous polyp of colon  Abnormal MRI, lumbar spine (03/11/2020) 10/29/2021   Lumbosacral lateral recess stenosis (Left: L5-S1) 10/29/2021   Elevated C-reactive protein (CRP) 09/18/2021   Chronic pain syndrome 09/08/2021   Pharmacologic therapy 09/08/2021   Disorder of skeletal system 09/08/2021   Problems influencing health status 09/08/2021   Lumbar facet syndrome 09/08/2021   Lumbosacral radiculopathy at S1 (Left) 09/08/2021   Chronic feet pain (2ry area of Pain) (Bilateral) 09/08/2021   Chronic ankle pain (Left)  09/08/2021   Abnormal NCS (nerve conduction studies) (02/20/2020) 09/08/2021   Neuropathy 06/19/2021   Gout 05/28/2021   PVD (peripheral vascular disease) (HCC) 05/28/2021   Hypertension associated with diabetes (HCC)    Chronic ankle pain (Bilateral) 07/25/2020   Chronic low back pain (1ry area of Pain) (Bilateral) (R>L) w/o sciatica 07/25/2020   Diabetic peripheral neuropathy (HCC) 12/30/2019   Other acquired hammer toe 11/28/2019   Ankle fracture 03/23/2017   Type 2 diabetes mellitus with diabetic neuropathy, with long-term current use of insulin  (HCC) 04/16/2016   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   Calculus of kidney 02/04/2015   Chronic obstructive pulmonary disease (HCC) 01/17/2013   Acid reflux 01/17/2013   Hyperlipidemia associated with type 2 diabetes mellitus (HCC) 01/17/2013   Apnea, sleep 01/17/2013   Testicular hypofunction 01/17/2013   Adiposity 07/06/2011    Past Surgical History:  Procedure Laterality Date   APPENDECTOMY     BRONCHOSCOPY  2016   CIRCUMCISION     COLONOSCOPY WITH PROPOFOL  N/A 12/31/2021   Procedure: COLONOSCOPY WITH PROPOFOL ;  Surgeon: Luke Salaam, MD;  Location: [facility] ENDOSCOPY;  Service: Gastroenterology;  Laterality: N/A;   ESOPHAGOGASTRODUODENOSCOPY N/A 12/31/2021   Procedure: ESOPHAGOGASTRODUODENOSCOPY (EGD);  Surgeon: Luke Salaam, MD;  Location: [facility] ENDOSCOPY;  Service: Gastroenterology;  Laterality: N/A;   INCISION AND DRAINAGE PERIRECTAL ABSCESS N/A 02/04/2015   Procedure: IRRIGATION AND DEBRIDEMENT PERIRECTAL ABSCESS;  Surgeon: Scherry Curtis, MD;  Location: [facility] ORS;  Service: General;  Laterality: N/A;   INSERTION OF MESH  01/20/2022   Procedure: INSERTION OF MESH;  Surgeon: Alben Alma, MD;  Location: [facility] ORS;  Service: General;;   INSERTION OF MESH N/A 04/08/2023   Procedure: INSERTION OF MESH;  Surgeon: Alben Alma, MD;  Location: [facility] ORS;  Service: General;  Laterality: N/A;   KIDNEY STONE SURGERY Right    lung mass removal N/A     MUSCLE BIOPSY     ORIF ANKLE FRACTURE Left 03/23/2017   Procedure: OPEN REDUCTION INTERNAL FIXATION (ORIF) ANKLE FRACTURE;  Surgeon: Lorri Rota, MD;  Location: [facility] ORS;  Service: Orthopedics;  Laterality: Left;   RECTAL EXAM UNDER ANESTHESIA  02/04/2015   Procedure: RECTAL EXAM UNDER ANESTHESIA;  Surgeon: Scherry Curtis, MD;  Location: [facility] ORS;  Service: General;;   SYNDESMOSIS REPAIR Left 03/23/2017   Procedure: SYNDESMOSIS REPAIR;  Surgeon: Lorri Rota, MD;  Location: [facility] ORS;  Service: Orthopedics;  Laterality: Left;   UMBILICAL HERNIA REPAIR  01/20/2022   Procedure: HERNIA REPAIR UMBILICAL ADULT;  Surgeon: Alben Alma, MD;  Location: [facility] ORS;  Service: General;;   VASECTOMY     VENTRAL HERNIA REPAIR N/A 04/08/2023   Procedure: HERNIA REPAIR VENTRAL ADULT, open;  Surgeon: Alben Alma, MD;  Location: [facility] ORS;  Service: General;  Laterality: N/A;   XI ROBOTIC ASSISTED PARAESOPHAGEAL HERNIA REPAIR N/A 01/20/2022   Procedure: XI ROBOTIC ASSISTED PARAESOPHAGEAL HERNIA REPAIR, CONVERTED TO OPEN, RNFA to assist;  Surgeon: Alben Alma, MD;  Location: [facility] ORS;  Service: General;  Laterality: N/A;  Home Medications    Prior to Admission medications   Medication Sig Start Date End Date Taking? Authorizing Provider  amitriptyline  (ELAVIL ) 10 MG tablet Take 30 mg by mouth at bedtime.   Yes [provider]  ascorbic acid (VITAMIN C) 500 MG tablet Take 500 mg by mouth daily.   Yes [provider]  Budeson-Glycopyrrol-Formoterol  (BREZTRI AEROSPHERE) 160-9-4.8 MCG/ACT AERO Inhale 2 puffs into the lungs in the morning and at bedtime.   Yes [provider]  clotrimazole -betamethasone  (LOTRISONE ) cream Apply 1 Application topically daily. 03/29/23  Yes Aileen Alexanders, NP  cyanocobalamin (VITAMIN B12) 1000 MCG tablet Take 1,000 mcg by mouth daily.   Yes [provider]  dapagliflozin  propanediol (FARXIGA ) 10 MG TABS tablet Take 1 tablet  (10 mg total) by mouth daily before breakfast. 02/25/23  Yes Aileen Alexanders, NP  ferrous sulfate  325 (65 FE) MG tablet Take 325 mg by mouth daily with breakfast.   Yes [provider]  fluticasone  (FLONASE ) 50 MCG/ACT nasal spray Place 1 spray into both nostrils daily as needed for allergies. 07/02/21  Yes [provider]  gabapentin  (NEURONTIN ) 600 MG tablet Take 600 mg by mouth 3 (three) times daily. 03/26/21  Yes [provider]  hydrochlorothiazide  (HYDRODIURIL ) 25 MG tablet Take 1 tablet (25 mg total) by mouth daily. 02/10/23  Yes Aileen Alexanders, NP  HYDROcodone -acetaminophen  (NORCO/VICODIN) 5-325 MG tablet Take 1 tablet by mouth 2 (two) times daily as needed. Must last 30 days. 07/21/23 08/20/23 Yes Renaldo Caroli, MD  insulin  isophane & regular human (HUMULIN 70/30 KWIKPEN) (70-30) 100 UNIT/ML KwikPen Inject 42-82 Units into the skin 2 (two) times daily with a meal. 82 in the a.m. and 42u qhs 10/14/19  Yes [provider]  ipratropium (ATROVENT ) 0.06 % nasal spray Place 2 sprays into both nostrils 4 (four) times daily. 05/12/23  Yes Brimage, Vondra, DO  ipratropium-albuterol  (DUONEB) 0.5-2.5 (3) MG/3ML SOLN Take 3 mLs by nebulization every 4 (four) hours as needed (SOB/Wheezing).   Yes [provider]  lisinopril  (ZESTRIL ) 10 MG tablet Take 1 tablet (10 mg total) by mouth daily. 02/25/23  Yes Aileen Alexanders, NP  Magnesium  Oxide -Mg Supplement 500 MG TABS Take 500 mg by mouth daily. 06/27/22  Yes [provider]  metFORMIN  (GLUCOPHAGE -XR) 500 MG 24 hr tablet TAKE 4 TABLETS BY MOUTH DAILY 07/06/23  Yes Aileen Alexanders, NP  metoprolol  tartrate (LOPRESSOR ) 25 MG tablet Take 2 doses (50 mg total) by mouth at bedtime the night before your exam and take 2 doses (50 mg total) by mouth the morning of your heart CT exam. 06/09/23 06/08/24 Yes [provider]  metroNIDAZOLE  (METROGEL ) 1 % gel Apply topically daily. qhs to face 06/03/23  Yes  Harris Liming, MD  montelukast  (SINGULAIR ) 10 MG tablet Take 10 mg by mouth daily. 07/02/21  Yes [provider]  naloxone  (NARCAN ) nasal spray 4 mg/0.1 mL Place 1 spray into the nose as needed for up to 365 doses (for opioid-induced respiratory depresssion). In case of emergency (overdose), spray once into each nostril. If no response within 3 minutes, repeat application and call 911. 02/17/23 02/17/24 Yes Renaldo Caroli, MD  NEEDLE, DISP, 18 G (BD SAFETYGLIDE NEEDLE) 18G X 1-1/2" MISC 1 mg by Does not apply route every 7 (seven) days. 07/05/23  Yes McGowan, Cathleen Coach A, PA-C  NEEDLE, DISP, 21 G (BD SAFETYGLIDE NEEDLE) 21G X 1" MISC 1 mg by Does not apply route every 7 (seven) days. 07/05/23  Yes McGowan, Danne Dustman, PA-C  pantoprazole  (PROTONIX ) 40 MG tablet Take 40 mg by mouth 2 (two) times daily. 03/22/23  Yes [provider]  PRESCRIPTION MEDICATION Pt has CPAP Machine   Yes [provider]  PROAIR  HFA 108 (90 Base) MCG/ACT inhaler Inhale 2 puffs into the lungs every 6 (six) hours as needed for wheezing or shortness of breath. 12/28/20  Yes [provider]  rosuvastatin  (CRESTOR ) 40 MG tablet TAKE 1 TABLET(40 MG) BY MOUTH DAILY 09/21/22  Yes Aileen Alexanders, NP  tazarotene  (AVAGE ) 0.1 % cream Apply topically at bedtime. qhs to face and chest for acne 06/03/23  Yes Harris Liming, MD  terbinafine  (LAMISIL ) 250 MG tablet Take 1 tablet (250 mg total) by mouth daily. 06/03/23 08/26/23 Yes Harris Liming, MD  testosterone  cypionate (DEPOTESTOSTERONE CYPIONATE) 200 MG/ML injection Inject 1 cc every 10 days. 07/16/23  Yes McGowan, Cathleen Coach A, PA-C  tirzepatide (MOUNJARO) 7.5 MG/0.5ML Pen Inject 7.5 mg into the skin once a week.   Yes [provider]  HYDROcodone -acetaminophen  (NORCO/VICODIN) 5-325 MG tablet Take 1 tablet by mouth 2 (two) times daily as needed. Must last 30 days. 06/21/23 07/21/23  Renaldo Caroli, MD    Family History Family History   Problem Relation Age of Onset   Lung cancer Mother 3   Colon cancer Father 26   Heart disease Father    Alcohol abuse Father    Esophageal cancer Brother 32   Diabetes Brother    Heart disease Brother    Lung cancer Maternal Aunt    Cancer Paternal Aunt        unk type   Cancer Maternal Grandfather        unk type    Social History Social History   Tobacco Use   Smoking status: Former    Current packs/day: 0.50    Average packs/day: 0.5 packs/day for 34.0 years (17.0 ttl pk-yrs)    Types: Cigarettes    Passive exposure: Past   Smokeless tobacco: Current  Vaping Use   Vaping status: Some Days   Substances: Nicotine   Substance Use Topics   Alcohol use: Not Currently    Alcohol/week: 12.0 standard drinks of alcohol    Types: 12 Cans of beer per week    Comment: Quit February 2023   Drug use: Yes    Types: Hydrocodone      Allergies   Glipizide, Cephalexin , Duloxetine, and Dupixent [dupilumab]   Review of Systems Review of Systems  Constitutional:  Positive for appetite change. Negative for fatigue and fever.  HENT:  Negative for congestion.   Respiratory:  Negative for cough and shortness of breath.   Cardiovascular:  Negative for chest pain.  Gastrointestinal:  Positive for abdominal pain, diarrhea, nausea and vomiting. Negative for abdominal distention.  Genitourinary:  Negative for difficulty urinating, dysuria, flank pain, frequency, hematuria and urgency.  Neurological:  Negative for weakness and headaches.     Physical Exam Triage Vital Signs ED Triage Vitals  Encounter Vitals Group     BP --      Systolic BP Percentile --      Diastolic BP Percentile --      Pulse --      Resp 07/28/23 0904 16     Temp --      Temp Source 07/28/23 0904 Oral     SpO2 --      Weight 07/28/23 0903 247 lb (112 kg)     Height 07/28/23 0903 6' (1.829 m)     Head Circumference --  Peak Flow --      Pain Score --      Pain Loc --      Pain Education --       Exclude from Growth Chart --    No data found.  Updated Vital Signs BP 124/79 (BP Location: Left Arm)   Pulse 86   Temp 98.4 F (36.9 C) (Oral)   Resp 16   Ht 6' (1.829 m)   Wt 247 lb (112 kg)   SpO2 97%   BMI 33.50 kg/m       Physical Exam Vitals and nursing note reviewed.  Constitutional:      General: He is not in acute distress.    Appearance: Normal appearance. He is well-developed. He is not ill-appearing.  HENT:     Head: Normocephalic and atraumatic.  Eyes:     General: No scleral icterus.    Conjunctiva/sclera: Conjunctivae normal.  Cardiovascular:     Rate and Rhythm: Normal rate and regular rhythm.  Pulmonary:     Effort: Pulmonary effort is normal. No respiratory distress.     Breath sounds: Normal breath sounds.  Abdominal:     Palpations: Abdomen is soft.     Tenderness: There is abdominal tenderness. There is guarding. There is no right CVA tenderness or left CVA tenderness.     Hernia: A hernia is present.     Comments: See image included in chart of patient's abdomen. Surgical scar of abdomen. There is erythema, warmth and tenderness along incisional hernia with area of induration. Area of induration is tender with guarding and rebound  Musculoskeletal:     Cervical back: Neck supple.  Skin:    General: Skin is warm and dry.     Capillary Refill: Capillary refill takes less than 2 seconds.  Neurological:     General: No focal deficit present.     Mental Status: He is alert. Mental status is at baseline.     Motor: No weakness.     Gait: Gait normal.  Psychiatric:        Mood and Affect: Mood normal.        Behavior: Behavior normal.      UC Treatments / Results  Labs (all labs ordered are listed, but only abnormal results are displayed) Labs Reviewed - No data to display  EKG   Radiology No results found.   Procedures Procedures (including critical care time)  Medications Ordered in UC Medications - No data to display  Initial  Impression / Assessment and Plan / UC Course  I have reviewed the triage vital signs and the nursing notes.  Pertinent labs & imaging results that were available during my care of the patient were reviewed by me and considered in my medical decision making (see chart for details).   58 y/o male with history of diabetes and chronic pain presents for abdominal wall erythema, induration, tenderness along incisional hernia x 3 days. History of surgical repair a few months ago. Chart review from 04/08/23 --incisional hernia repair. Denies fever but has been on tylenol  containing pain medication.   Vitals are all normal and stable. See image included in chart. Consistent with cellulitis and likely underlying abscess related to hernia.   Advised ED for imaging to assess for abscess, hernia complications. High likelihood of needing another surgery. Patient stable for PV transport to [facility].    Final Clinical Impressions(s) / UC Diagnoses   Final diagnoses:  Abdominal wall cellulitis  Incisional hernia, without  obstruction or gangrene     Discharge Instructions      -You need abdominal imaging to assess for abscess, sepsis, strangulated or incarcerated hernia.  -Go to ER now.  You have been advised to follow up immediately in the emergency department for concerning signs.symptoms. If you declined EMS transport, please have a family member take you directly to the ED at this time. Do not delay. Based on concerns about condition, if you do not follow up in th e ED, you may risk poor outcomes including worsening of condition, delayed treatment and potentially life threatening issues. If you have declined to go to the ED at this time, you should call your PCP immediately to set up a follow up appointment.  Go to ED for red flag symptoms, including; fevers you cannot reduce with Tylenol /Motrin, severe headaches, vision changes, numbness/weakness in part of the body, lethargy, confusion, intractable  vomiting, severe dehydration, chest pain, breathing difficulty, severe persistent abdominal or pelvic pain, signs of severe infection (increased redness, swelling of an area), feeling faint or passing out, dizziness, etc. You should especially go to the ED for sudden acute worsening of condition if you do not elect to go at this time.    ED Prescriptions   None    PDMP not reviewed this encounter.   Floydene Hy, PA-C 07/28/23 1302

## 2023-07-28 NOTE — Consult Note (Signed)
 Pharmacy Antibiotic Note  ASSESSMENT: 58 y.o. male with PMH including DM, HTN, asthma/COPD, hernia, neuropathy is presenting with abdominal wall swelling and rash with pain. CTAP exhibits infected collections or abscesses in the anterior abdomen.  Surgery to see for potential I&D. Pharmacy has been consulted to manage vancomycin  dosing.  Patient also receiving Zosyn  3.375g IV q8H.  Patient measurements: Height: 6\' 2"  (188 cm) Weight: 112 kg (247 lb) IBW/kg (Calculated) : 82.2  Vital signs: Temp: 98.3 F (36.8 C) (05/14 1036) Temp Source: Oral (05/14 1036) BP: 125/73 (05/14 1036) Pulse Rate: 85 (05/14 1036) Recent Labs  Lab 07/28/23 1039  WBC 7.0  CREATININE 1.26*   Estimated Creatinine Clearance: 83 mL/min (A) (by C-G formula based on SCr of 1.26 mg/dL (H)).  Allergies: Allergies  Allergen Reactions   Glipizide Other (See Comments) and Palpitations    Shaky, feel bad Other reaction(s): Dizziness   Cephalexin  Rash   Duloxetine Anxiety and Nausea Only   Dupixent [Dupilumab] Rash    Antimicrobials this admission: Ceftriaxone  5/14 x 1 Zosyn  5/14 >> Vancomycin  5/14 >>  Dose adjustments this admission: N/A  Microbiology results: 5/14 MRSA PCR: collected  PLAN: Initiate vancomycin  2500mg  IV q24H eAUC 480, Cmax 39, Cmin 10 Scr 1.26, IBW, Vd 0.72 L/kg Follow up culture results to assess for antibiotic optimization. Monitor renal function to assess for any necessary antibiotic dosing changes.   Thank you for allowing pharmacy to be a part of this patient's care.  Will M. Alva Jewels, PharmD Clinical Pharmacist 07/28/2023 2:54 PM

## 2023-07-28 NOTE — Procedures (Signed)
 Procedure Note  Date: 07/28/23 3:25 PM  Preforming Provider: [name], PA-C  Pre-Procedure Diagnosis: Abdominal Wall Cellulitis, Abscess  Post-Procedure Diagnosis: Same   Anesthesia: 6 ccs of 1% lidocaine  with epinephrine   Findings: Purulent Drainage, Cultured   Details of Procedure:  All risks, benefits, and alternatives to above procedure(s) were discussed with the patient and informed consent was obtained. Patients abdominal wall was prepped and draped in standard sterile fashion. 6 ccs of 1% lidocaine  with epinephrine  with injected intradermally and adequate anesthesia achieved. Using an 11 blade scalpel, incision was made over area of fluctuance. Immediate return of serous and purulent fluid was encountered. This was cultured. Completion of incision revealed underlying cavity. Fascia was intact. All purulence was expressed. The wound was then irrigated with copious amount of NS and packed with 1/4 inch packing strips. The patient tolerated this well without immediate complications. All sharps were accounted for and disposed of properly  Complications: None apparent  Plan:  Pack wound daily, change as needed Follow up Cx  --  [name], PA-C [facility] 07/28/2023, 1:55 PM M-F: 7am - 4pm

## 2023-07-29 ENCOUNTER — Other Ambulatory Visit: Payer: Self-pay

## 2023-07-29 DIAGNOSIS — L02211 Cutaneous abscess of abdominal wall: Secondary | ICD-10-CM | POA: Diagnosis not present

## 2023-07-29 LAB — FOLATE: Folate: 17.5 ng/mL (ref >5.9)

## 2023-07-29 LAB — CREATININE, SERUM
Creatinine, Ser: 1.38 mg/dL — ABNORMAL HIGH (ref 0.61–1.24)
GFR, Estimated: 59 mL/min — ABNORMAL LOW (ref >60)

## 2023-07-29 LAB — IRON AND TIBC
Iron: 37 ug/dL — ABNORMAL LOW (ref 45–182)
Saturation Ratios: 8 % — ABNORMAL LOW (ref 17.9–39.5)
TIBC: 475 ug/dL — ABNORMAL HIGH (ref 250–450)
UIBC: 438 ug/dL

## 2023-07-29 LAB — HIV ANTIBODY (ROUTINE TESTING W REFLEX): HIV Screen 4th Generation wRfx: NONREACTIVE

## 2023-07-29 LAB — VITAMIN B12: Vitamin B-12: 700 pg/mL (ref 180–914)

## 2023-07-29 LAB — GLUCOSE, CAPILLARY
Glucose-Capillary: 143 mg/dL — ABNORMAL HIGH (ref 70–99)
Glucose-Capillary: 163 mg/dL — ABNORMAL HIGH (ref 70–99)

## 2023-07-29 MED ORDER — HYDROCHLOROTHIAZIDE 25 MG PO TABS: 25.0000 mg | ORAL_TABLET | Freq: Every day | ORAL | Status: DC

## 2023-07-29 MED ORDER — BISACODYL 5 MG PO TBEC: 10.0000 mg | DELAYED_RELEASE_TABLET | Freq: Every day | ORAL | Status: DC | PRN

## 2023-07-29 MED ORDER — CIPROFLOXACIN HCL 500 MG PO TABS
500.0000 mg | ORAL_TABLET | Freq: Two times a day (BID) | ORAL | 0 refills | Status: DC
  Filled 2023-07-29: qty 28, 14d supply, fill #0

## 2023-07-29 MED ORDER — METRONIDAZOLE 500 MG PO TABS
500.0000 mg | ORAL_TABLET | Freq: Two times a day (BID) | ORAL | 0 refills | Status: AC
  Filled 2023-07-29: qty 28, 14d supply, fill #0

## 2023-07-29 MED ORDER — POLYETHYLENE GLYCOL 3350 17 G PO PACK: 17.0000 g | PACK | Freq: Every day | ORAL | Status: DC | PRN

## 2023-07-29 MED ORDER — BISACODYL 10 MG RE SUPP: 10.0000 mg | Freq: Every day | RECTAL | Status: DC | PRN

## 2023-07-29 NOTE — Plan of Care (Signed)
   Problem: Education: Goal: Ability to describe self-care measures that may prevent or decrease complications (Diabetes Survival Skills Education) will improve Outcome: Progressing

## 2023-07-29 NOTE — Discharge Instructions (Signed)
 Wound Care: Pack wound daily with packing strips. Cover with dry gauze and secure with tape. Okay to completely remove dressing and packing to shower. Do not submerge wound, change dressing after showering. Okay to change outer gauze as needed.

## 2023-07-29 NOTE — Progress Notes (Signed)
 [facility] SURGICAL PROGRESS NOTE  Hospital Day(s): 0.   Interval History:  Patient seen and examined No acute events or new complaints overnight.  Patient reports he is doing well; abdomen is sore No fever, chills sCr elevated; 1.38; UO - unmeasured Cx with GNR He is on Vancomycin /Zosyn   Vital signs in last 24 hours: [min-max] current  Temp:  [97.6 F (36.4 C)-98.4 F (36.9 C)] 98.4 F (36.9 C) (05/14 2006) Pulse Rate:  [80-95] 80 (05/14 2006) Resp:  [16-17] 16 (05/14 2006) BP: (124-138)/(73-92) 125/76 (05/14 2006) SpO2:  [97 %-100 %] 98 % (05/14 2006) Weight:  [161 kg] 112 kg (05/14 1036)     Height: 6\' 2"  (188 cm) Weight: 112 kg BMI (Calculated): 31.7   Intake/Output last 2 shifts:  05/14 0701 - 05/15 0700 In: 500 [IV Piggyback:500] Out: -    Physical Exam:  Constitutional: alert, cooperative and no distress  Respiratory: breathing non-labored at rest  Cardiovascular: regular rate and sinus rhythm  Integumentary: I&D to anterior abdominal wall in the midline, serosanguinous drainage on dressing, packing removed and changed. Previous erythema significantly improved.   Labs:     Latest Ref Rng & Units 07/28/2023   10:39 AM 02/25/2023   10:26 AM 02/23/2023    1:44 PM  CBC  WBC 4.0 - 10.5 K/uL 7.0  7.0    Hemoglobin 13.0 - 17.0 g/dL 09.6  04.5  40.9   Hematocrit 39.0 - 52.0 % 36.4  39.6  39.3   Platelets 150 - 400 K/uL 236  272        Latest Ref Rng & Units 07/29/2023    3:37 AM 07/28/2023   10:39 AM 02/25/2023   10:26 AM  CMP  Glucose 70 - 99 mg/dL  811  914   BUN 6 - 20 mg/dL  19  13   Creatinine 7.82 - 1.24 mg/dL 9.56  2.13  0.86   Sodium 135 - 145 mmol/L  137  137   Potassium 3.5 - 5.1 mmol/L  3.7  3.5   Chloride 98 - 111 mmol/L  105  96   CO2 22 - 32 mmol/L  25  23   Calcium  8.9 - 10.3 mg/dL  8.9  9.8   Total Protein 6.5 - 8.1 g/dL  7.1  6.9   Total Bilirubin 0.0 - 1.2 mg/dL  0.7  0.5   Alkaline Phos 38 - 126 U/L  46  59   AST 15 -  41 U/L  34  34   ALT 0 - 44 U/L  38  39      Imaging studies: No new pertinent imaging studies   Assessment/Plan:  58 y.o. male 1 day s/p incision and drainage of abdominal wall abscess    - Continue IV Abx (Vancomycin /Zosyn ); follow up Cx & narrow  - Wound Care: Pack wound daily, cover, secure. Changed this AM  - Pain control prn   - Further management per primary service    - Discharge Planning: Nothing further from surgical standpoint, he feels comfortable with wound care. Tailor Abx for home for 10 days total. We will plan to follow up next week for wound check  All of the above findings and recommendations were discussed with the patient, and the medical team, and all of patient's questions were answered to his expressed satisfaction.  -- [name], PA-C [facility] 07/29/2023, 7:38 AM M-F: 7am - 4pm

## 2023-07-29 NOTE — Care Management Obs Status (Signed)
 MEDICARE OBSERVATION STATUS NOTIFICATION   Patient Details  Name: Darren Allen MRN: [id number] Date of Birth: November 05, 1965   Medicare Observation Status Notification Given:  Rudolph Cost, CMA 07/29/2023, 11:52 AM

## 2023-07-29 NOTE — Progress Notes (Signed)
 Patient discharged home. Writer reviewed home care instructions with patient and has no further questions. Patient has all belongings and will be transported to front lobby via wheelchair by staff. Significant other driving patient home.

## 2023-07-29 NOTE — Discharge Summary (Signed)
 [facility] Discharge Summary   Patient: Darren Allen ZOX:[id number]  PCP: Aileen Alexanders, NP  Date of admission: 07/28/2023   Date of discharge:  07/29/2023     Discharge Diagnoses:  Principal Problem:   Abdominal wall abscess   Admitted From: Home Disposition:  Home   Recommendations for Outpatient Follow-up:  PCP: in 1 wk F/u with general surgery in 1 week Follow up LABS/TEST:     Follow-up Information     Ada Acres, PA-C. Go on 08/04/2023.   Specialty: Physician Assistant Why: Go to appointment on 05/21 at 130 PM Contact information: [address] [place] [address] [phone]                Diet recommendation: Carb modified diet  Activity: The patient is advised to gradually reintroduce usual activities, as tolerated  Discharge Condition: stable  Code Status: Full code   History of present illness: As per the H and P dictated on admission Hospital Course:  Darren Allen is a 58 y.o. male with medical history significant of IDDM, HTN, HLD, diabetic neuropathy, asthma/COPD, presented with worsening of abdominal wall swelling rash and pain.   Symptoms started 2 days ago.  January this year, patient underwent ventral hernia repair and the surgical site feeding was good.  2 days ago, patient started to have a rash of the abdominal wall surgical scar site, associated with sweating and tenderness.  Symptoms getting worse yesterday evening, and this morning he had 1 episode of feeling nausea and diarrhea.  And he has noticed significant enlargement of the the swelling site and rash.  Denies any fever or chills.   ED Course: Afebrile, nontachycardic blood pressure 125/73 O2 saturation 98% on room air.  CT abdomen pelvis showed infected collections or abscesses in the anterior abdominal along the hernia repair.  Blood work showed WBC 7.0, hemoglobin 11.2, BUN 19 creatinine 1.2.   Patient was given vancomycin  ceftriaxone  and Flagyl   in the ED.   Assessment/Plan   # Abdominal wall cellulitis and abscess formation -CT abdomen pelvis with contrast result reviewed, appears to have no involvement of intra-abdominal organs and no peritonitis on physical exam General Surgeon consulted, s/p I&D done and culture sent. S/p vancomycin  and cefepime.  Patient was cleared by general surgery to discharge on antibiotics ciprofloxacin  and Flagyl  for 14 days and follow-up as an outpatient. I received a call on the next day 5/16 that patient could not tolerate ciprofloxacin  so prescription was replaced with Bactrim  DS twice daily for 14 days.  And continued Flagyl .  General surgery, they will follow.  Ordered an outpatient.   # IDDM, resumed Insulin  70/30 home dose and resumed metformin  # HTN, Stable, continue HCTZ and lisinopril  # Diabetic neuropathy: Stable, continue gabapentin  # COPD: Stable, continue as needed albuterol , ICS and LABA    Body mass index is 31.71 kg/m.  Nutrition Interventions:  Patient was ambulatory without any assistance. On the day of the discharge the patient's vitals were stable, and no other acute medical condition were reported by patient. the patient was felt safe to be discharge at Home.  Consultants: Gen Surgery  Procedures: I&D  Discharge Exam: General: Appear in no distress, no Rash; Oral Mucosa Clear, moist. Cardiovascular: S1 and S2 Present, no Murmur, Respiratory: normal respiratory effort, Bilateral Air entry present and no Crackles, no wheezes Abdomen: Bowel Sound present, Soft and mild tenderness, Dressing CDI  Extremities: no Pedal edema, no calf tenderness Neurology: alert and oriented to time,  place, and person affect appropriate.  Filed Weights   07/28/23 1036  Weight: 112 kg   Vitals:   07/28/23 2006 07/29/23 0759  BP: 125/76 103/75  Pulse: 80 80  Resp: 16 16  Temp: 98.4 F (36.9 C) 97.8 F (36.6 C)  SpO2: 98% 98%    DISCHARGE MEDICATION: Allergies as of 07/29/2023        Reactions   Glipizide Other (See Comments), Palpitations   Shaky, feel bad Other reaction(s): Dizziness   Cephalexin  Rash   Duloxetine Anxiety, Nausea Only   Dupixent [dupilumab] Rash        Medication List     STOP taking these medications    metoprolol  tartrate 25 MG tablet Commonly known as: LOPRESSOR        TAKE these medications    amitriptyline  10 MG tablet Commonly known as: ELAVIL  Take 30 mg by mouth at bedtime.   ascorbic acid 500 MG tablet Commonly known as: VITAMIN C Take 500 mg by mouth daily.   BD SafetyGlide Needle 18G X 1-1/2" Misc Generic drug: NEEDLE (DISP) 18 G 1 mg by Does not apply route every 7 (seven) days.   BD SafetyGlide Needle 21G X 1" Misc Generic drug: NEEDLE (DISP) 21 G 1 mg by Does not apply route every 7 (seven) days.   Breztri  Aerosphere 160-9-4.8 MCG/ACT Aero inhaler Generic drug: budesonide -glycopyrrolate -formoterol  Inhale 2 puffs into the lungs in the morning and at bedtime.   ciprofloxacin  500 MG tablet Commonly known as: Cipro  Take 1 tablet (500 mg total) by mouth 2 (two) times daily for 14 days.   clotrimazole -betamethasone  cream Commonly known as: LOTRISONE  Apply 1 Application topically daily.   cyanocobalamin 1000 MCG tablet Commonly known as: VITAMIN B12 Take 1,000 mcg by mouth daily.   dapagliflozin  propanediol 10 MG Tabs tablet Commonly known as: Farxiga  Take 1 tablet (10 mg total) by mouth daily before breakfast.   ferrous sulfate  325 (65 FE) MG tablet Take 325 mg by mouth daily with breakfast.   fluticasone  50 MCG/ACT nasal spray Commonly known as: FLONASE  Place 1 spray into both nostrils daily as needed for allergies.   gabapentin  600 MG tablet Commonly known as: NEURONTIN  Take 600 mg by mouth 3 (three) times daily.   HumuLIN 70/30 KwikPen (70-30) 100 UNIT/ML KwikPen Generic drug: insulin  isophane & regular human KwikPen Inject 42-82 Units into the skin 2 (two) times daily with a meal. 82 in the  a.m. and 42u qhs   hydrochlorothiazide  25 MG tablet Commonly known as: HYDRODIURIL  Take 1 tablet (25 mg total) by mouth daily.   HYDROcodone -acetaminophen  5-325 MG tablet Commonly known as: NORCO/VICODIN Take 1 tablet by mouth 2 (two) times daily as needed. Must last 30 days.   HYDROcodone -acetaminophen  5-325 MG tablet Commonly known as: NORCO/VICODIN Take 1 tablet by mouth 2 (two) times daily as needed. Must last 30 days.   ipratropium 0.06 % nasal spray Commonly known as: ATROVENT  Place 2 sprays into both nostrils 4 (four) times daily.   ipratropium-albuterol  0.5-2.5 (3) MG/3ML Soln Commonly known as: DUONEB Take 3 mLs by nebulization every 4 (four) hours as needed (SOB/Wheezing).   lisinopril  10 MG tablet Commonly known as: ZESTRIL  Take 1 tablet (10 mg total) by mouth daily.   Magnesium  Oxide -Mg Supplement 500 MG Tabs Take 500 mg by mouth daily.   metFORMIN  500 MG 24 hr tablet Commonly known as: GLUCOPHAGE -XR TAKE 4 TABLETS BY MOUTH DAILY   metroNIDAZOLE  1 % gel Commonly known as: Metrogel  Apply topically daily. qhs  to face   metroNIDAZOLE  500 MG tablet Commonly known as: FLAGYL  Take 1 tablet (500 mg total) by mouth 2 (two) times daily for 14 days.   montelukast  10 MG tablet Commonly known as: SINGULAIR  Take 10 mg by mouth daily.   Mounjaro 10 MG/0.5ML Pen Generic drug: tirzepatide Inject 10 mg into the skin once a week. What changed: Another medication with the same name was removed. Continue taking this medication, and follow the directions you see here.   naloxone  4 MG/0.1ML Liqd nasal spray kit Commonly known as: NARCAN  Place 1 spray into the nose as needed for up to 365 doses (for opioid-induced respiratory depresssion). In case of emergency (overdose), spray once into each nostril. If no response within 3 minutes, repeat application and call 911.   pantoprazole  40 MG tablet Commonly known as: PROTONIX  Take 40 mg by mouth 2 (two) times daily.    PRESCRIPTION MEDICATION Pt has CPAP Machine   ProAir  HFA 108 (90 Base) MCG/ACT inhaler Generic drug: albuterol  Inhale 2 puffs into the lungs every 6 (six) hours as needed for wheezing or shortness of breath.   rosuvastatin  40 MG tablet Commonly known as: CRESTOR  TAKE 1 TABLET(40 MG) BY MOUTH DAILY   tazarotene  0.1 % cream Commonly known as: AVAGE  Apply topically at bedtime. qhs to face and chest for acne   terbinafine  250 MG tablet Commonly known as: LAMISIL  Take 1 tablet (250 mg total) by mouth daily.   testosterone  cypionate 200 MG/ML injection Commonly known as: DEPOTESTOSTERONE CYPIONATE Inject 1 cc every 10 days.               Discharge Care Instructions  (From admission, onward)           Start     Ordered   07/29/23 0000  Discharge wound care:       Comments: As per general surgery   07/29/23 1339           Allergies  Allergen Reactions   Glipizide Other (See Comments) and Palpitations    Shaky, feel bad Other reaction(s): Dizziness   Cephalexin  Rash   Duloxetine Anxiety and Nausea Only   Dupixent [Dupilumab] Rash   Discharge Instructions     Call MD for:  difficulty breathing, headache or visual disturbances   Complete by: As directed    Call MD for:  extreme fatigue   Complete by: As directed    Call MD for:  persistant dizziness or light-headedness   Complete by: As directed    Call MD for:  persistant nausea and vomiting   Complete by: As directed    Call MD for:  redness, tenderness, or signs of infection (pain, swelling, redness, odor or green/yellow discharge around incision site)   Complete by: As directed    Call MD for:  severe uncontrolled pain   Complete by: As directed    Call MD for:  temperature >100.4   Complete by: As directed    Diet - low sodium heart healthy   Complete by: As directed    Discharge instructions   Complete by: As directed    Follow-up with PCP in 1 week Follow-up with general surgery in 1 week,  pending wound cultures.   Discharge wound care:   Complete by: As directed    As per general surgery   Increase activity slowly   Complete by: As directed        The results of significant diagnostics from this hospitalization (including imaging, microbiology, ancillary  and laboratory) are listed below for reference.    Significant Diagnostic Studies: CT ABDOMEN PELVIS W CONTRAST Result Date: 07/28/2023 CLINICAL DATA:  Abdominal pain. Erythema over the hernia repair site. EXAM: CT ABDOMEN AND PELVIS WITH CONTRAST TECHNIQUE: Multidetector CT imaging of the abdomen and pelvis was performed using the standard protocol following bolus administration of intravenous contrast. RADIATION DOSE REDUCTION: This exam was performed according to the departmental dose-optimization program which includes automated exposure control, adjustment of the mA and/or kV according to patient size and/or use of iterative reconstruction technique. CONTRAST:  OMNIPAQUE  IOHEXOL  300 MG/ML  SOLN COMPARISON:  CT abdomen pelvis dated 03/04/2023. FINDINGS: Lower chest: The visualized lung bases are clear. No intra-abdominal free air or free fluid. Hepatobiliary: The liver is unremarkable. No biliary dilatation. The gallbladder is unremarkable Pancreas: Unremarkable. No pancreatic ductal dilatation or surrounding inflammatory changes. Spleen: Normal in size without focal abnormality. Adrenals/Urinary Tract: The adrenal glands are unremarkable. There is no hydronephrosis on either side. There is symmetric enhancement and excretion of contrast by both kidneys. There is a 4 mm stone along the left posterior bladder wall at the ureterovesical junction. The urinary bladder is unremarkable. Stomach/Bowel: There is sigmoid diverticulosis. There is no bowel obstruction or active inflammation. Appendectomy. Vascular/Lymphatic: Moderate aortoiliac atherosclerotic disease. The IVC is unremarkable. No portal venous gas. There is no  adenopathy. Reproductive: The prostate and seminal vesicles are grossly unremarkable. No pelvic mass. Other: There is a 1.5 x 4.0 cm loculated collection containing air and fluid in the anterior abdomen along the hernia repair. It is difficult to determine whether this collection is superficial or deep to the hernia repair. Additional similar sized collection in the subcutaneous soft tissues at the same level with surrounding inflammatory changes. These two collections likely communicate with each other. Musculoskeletal: No acute osseous pathology. IMPRESSION: 1. Infected collections or abscesses in the anterior abdomen along the hernia repair as above. 2. A 4 mm stone along the left posterior bladder wall at the ureterovesical junction. No hydronephrosis. 3. Sigmoid diverticulosis. No bowel obstruction. 4.  Aortic Atherosclerosis (ICD10-I70.0). Electronically Signed   By: Angus Bark M.D.   On: 07/28/2023 12:35   CT CORONARY FFR DATA PREP & FLUID ANALYSIS Result Date: 07/27/2023 EXAM: CT FFR ANALYSIS CLINICAL DATA:  Abnormal CCTA FINDINGS: FFRct analysis was performed on the original cardiac CT angiogram dataset. Diagrammatic representation of the FFRct analysis is provided in a separate PDF document in PACS. This dictation was created using the PDF document and an interactive 3D model of the results. 3D model is not available in the EMR/PACS. Normal FFR range is >0.80. 1. Left Main:  No significant stenosis. 2. LAD: significant stenosis in mid LAD, FFRct 0.77. 3. LCX: No significant stenosis.  FFRct 0.90 4. RCA: significant stenosis in proximal RCA.  FFRct 0.75 IMPRESSION: 1. CT FFR analysis showed significant stenosis in mid LAD and proximal RCA. 2.  Recommend cardiac catheterization. Electronically Signed   By: Constancia Delton M.D.   On: 07/27/2023 09:38   CT CORONARY MORPH W/CTA COR W/SCORE W/CA W/CM &/OR WO/CM Result Date: 07/26/2023 CLINICAL DATA:  Dyspnea on exertion EXAM: Cardiac/Coronary  CTA  TECHNIQUE: The patient was scanned on a Siemens Somatom scanner. : A retrospective scan was triggered in the ascending thoracic aorta. Axial non-contrast 3 mm slices were carried out through the heart. The data set was analyzed on a dedicated work station and scored using the Agatson method. Gantry rotation speed was 66 msecs and collimation was .6  mm. 75 mg of metoprolol  and 0.8 mg of sl NTG was given. The 3D data set was reconstructed in 5% intervals of the 60-95 % of the R-R cycle. Diastolic phases were analyzed on a dedicated work station using MPR, MIP and VRT modes. The patient received 80 cc of contrast. FINDINGS: Aorta:  Normal size.  No calcifications.  No dissection. Aortic Valve:  Trileaflet.  No calcifications. Coronary Arteries:  Normal coronary origin.  Right dominance. RCA is a dominant artery. There is calcified plaque proximally causing moderate stenosis (50%). Left main gives rise to LAD and LCX arteries. LM has no disease. LAD has calcified and non calcified plaque proximally causing severe stenosis (>70%). LCX is a non-dominant artery.  There is no plaque. Other findings: Normal pulmonary vein drainage into the left atrium. Normal left atrial appendage without a thrombus. Normal size of the pulmonary artery. IMPRESSION: 1. Coronary calcium  score of 483. This was 91st percentile for age and sex matched control. 2. Normal coronary origin with right dominance. 3. Severe proximal LAD stenosis (>70%). 4. Moderate proximal RCA stenosis (50%). 5. CAD-RADS 4 Severe stenosis. (70-99% or > 50% left main). Cardiac catheterization is recommended. Consider symptom-guided anti-ischemic pharmacotherapy as well as risk factor modification per guideline directed care. 6. Additional analysis with CT FFR will be submitted and reported separately. Electronically Signed   By: Constancia Delton M.D.   On: 07/26/2023 14:59   DG PAIN CLINIC C-ARM 1-60 MIN NO REPORT Result Date: 07/13/2023 Fluoro was used, but no  Radiologist interpretation will be provided. Please refer to "NOTES" tab for provider progress note.   Microbiology: Recent Results (from the past 240 hours)  Aerobic/Anaerobic Culture w Gram Stain (surgical/deep wound)     Status: None (Preliminary result)   Collection Time: 07/28/23  3:34 PM   Specimen: Abdomen; Abscess  Result Value Ref Range Status   Specimen Description   Final    ABDOMEN Performed at [facility], [address] [place], [place] [address]    Special Requests   Final    NONE Performed at [facility], [address] [place], [place] [address]    Gram Stain   Final    ABUNDANT WBC PRESENT, PREDOMINANTLY PMN RARE GRAM NEGATIVE RODS    Culture   Final    TOO YOUNG TO READ Performed at [facility], [address], [place], [place] [address]    Report Status PENDING  Incomplete     Labs: CBC: Recent Labs  Lab 07/28/23 1039  WBC 7.0  HGB 11.2*  HCT 36.4*  MCV 77.8*  PLT 236   Basic Metabolic Panel: Recent Labs  Lab 07/28/23 1039 07/29/23 0337  NA 137  --   K 3.7  --   CL 105  --   CO2 25  --   GLUCOSE 142*  --   BUN 19  --   CREATININE 1.26* 1.38*  CALCIUM  8.9  --    Liver Function Tests: Recent Labs  Lab 07/28/23 1039  AST 34  ALT 38  ALKPHOS 46  BILITOT 0.7  PROT 7.1  ALBUMIN  4.0   Recent Labs  Lab 07/28/23 1039  LIPASE 43   No results for input(s): "AMMONIA" in the last 168 hours. Cardiac Enzymes: No results for input(s): "CKTOTAL", "CKMB", "CKMBINDEX", "TROPONINI" in the last 168 hours. BNP (last 3 results) No results for input(s): "BNP" in the last 8760 hours. CBG: Recent Labs  Lab 07/28/23 1744 07/28/23 2046 07/29/23 0800  07/29/23 1108  GLUCAP 187* 130* 143* 163*    Time spent: 35 minutes  Signed:  Althia Atlas  [facility] 07/29/2023 1:39 PM

## 2023-07-29 NOTE — Plan of Care (Signed)
 Patient discharged home at this time. Care plan met.

## 2023-07-30 ENCOUNTER — Other Ambulatory Visit: Payer: Self-pay | Admitting: Student

## 2023-07-30 MED ORDER — SULFAMETHOXAZOLE-TRIMETHOPRIM 800-160 MG PO TABS: 1.0000 | ORAL_TABLET | Freq: Two times a day (BID) | ORAL | 0 refills | Status: AC

## 2023-07-30 NOTE — Progress Notes (Deleted)
 {  Select_TRH_Note:[id number]}

## 2023-07-30 NOTE — Progress Notes (Signed)
 Patient called to the floor that ciprofloxacin  is causing burning, itching and drops his blood glucose and he is asking for new prescription. Wound culture is still pending, discussed with pharmacy, decided to start Bactrim  twice daily for 14 days and stop ciprofloxacin .  Continue Flagyl .

## 2023-08-02 LAB — AEROBIC/ANAEROBIC CULTURE W GRAM STAIN (SURGICAL/DEEP WOUND)

## 2023-08-04 ENCOUNTER — Ambulatory Visit (INDEPENDENT_AMBULATORY_CARE_PROVIDER_SITE_OTHER): Admitting: Physician Assistant

## 2023-08-04 ENCOUNTER — Encounter: Payer: Self-pay | Admitting: Physician Assistant

## 2023-08-04 VITALS — BP 117/73 | HR 94 | Temp 98.4°F | Ht 74.0 in | Wt 248.6 lb

## 2023-08-04 DIAGNOSIS — Z09 Encounter for follow-up examination after completed treatment for conditions other than malignant neoplasm: Secondary | ICD-10-CM

## 2023-08-04 DIAGNOSIS — L02211 Cutaneous abscess of abdominal wall: Secondary | ICD-10-CM

## 2023-08-04 NOTE — Patient Instructions (Signed)
 Incision and Drainage, Care After After incision and drainage, it is common to have: Pain or discomfort around the incision site. Blood, fluid, or pus (drainage) from the incision. Redness and firm skin around the incision site. Follow these instructions at home: Medicines Take over-the-counter and prescription medicines only as told by your health care provider. If you were prescribed antibiotics, take them as told by your provider. Do not stop using the antibiotic even if you start to feel better. Do not apply creams, ointments, or liquids unless you have been told to by your provider. Wound care Follow instructions from your provider about how to take care of your wound. Make sure you: Wash your hands with soap and water for at least 20 seconds before and after you change your bandage (dressing). If soap and water are not available, use hand sanitizer. Change your dressing and any packing as told by your provider. If the dressing is dry or stuck when you try to remove it, moisten or wet it with saline or water. This will help you remove it without harming your skin or tissues. If your wound is packed, leave it in place until your provider tells you to remove it. To remove it, moisten or wet the packing with saline or water. Leave stitches (sutures), skin glue, or tape strips in place. These skin closures may need to stay in place for 2 weeks or longer. If tape strip edges start to loosen and curl up, you may trim the loose edges. Do not remove tape strips completely unless your provider tells you to do that. Check your wound every day for signs of infection. Check for: More redness, swelling, or pain. More fluid or blood. Warmth. Pus or a bad smell. If you were sent home with a drain tube in place, follow instructions from your provider about: How to empty it. How to care for it at home. Be careful when you get rid of used dressings, wound packing, or drainage. Activity Rest the  affected area. Return to your normal activities as told by your provider. Ask your provider what activities are safe for you. General instructions Do not use any products that contain nicotine or tobacco. These products include cigarettes, chewing tobacco, and vaping devices, such as e-cigarettes. These can delay incision healing after surgery. If you need help quitting, ask your provider. Do not take baths, swim, or use a hot tub until your provider approves. Ask your provider if you may take showers. You may only be allowed to take sponge baths. The incision will keep draining. It is normal to have some clear or slightly bloody drainage. The amount of drainage should go down each day. Keep all follow-up visits. Your provider will need to make sure that your incision is healing well and that there are no problems. Your health care provider may give you more instructions. Make sure you know what you can and cannot do Contact a health care provider if: Your cyst or abscess comes back. You have any signs of infection. You notice red streaks that spread away from the incision site. You have a fever or chills. Get help right away if: You have severe pain or bleeding. You become short of breath. You have chest pain. You have signs of a severe infection. You may notice changes in your incision area, such as: Swelling that makes the skin feel hard. Numbness or tingling. Sudden increase in redness. Your skin color may change from red to purple, and then to dark  spots. Blisters, ulcers, or splitting of the skin. These symptoms may be an emergency. Get help right away. Call 911. Do not wait to see if the symptoms will go away. Do not drive yourself to the hospital. This information is not intended to replace advice given to you by your health care provider. Make sure you discuss any questions you have with your health care provider. Document Revised: 10/20/2021 Document Reviewed: 10/20/2021 Elsevier  Patient Education  2024 [employer].

## 2023-08-04 NOTE — Progress Notes (Signed)
 [facility] POST-OP OFFICE VISIT  08/04/2023  HPI: Darren Allen is a 58 y.o. male 7 days s/p incision and drainage of abdominal wall abscess  He is doing well Reports "he hasn't felt this good in a while" No fever, chills, pain Changing dressing as instructed; drainage serous  Abx were switched to Bactirm on 05/16 after noting burning, itching, and hypoglycemia with Cipro . Continued on Flagyl   Cultures reviewed, E coli is resistant to bactrim    Vital signs: BP 117/73   Pulse 94   Temp 98.4 F (36.9 C) (Oral)   Ht 6\' 2"  (1.88 m)   Wt 248 lb 9.6 oz (112.8 kg)   SpO2 97%   BMI 31.92 kg/m    Physical Exam: Constitutional: Well appearing male, NAD Abdomen / Skin: 2 x 1 cm wound to the anterior abdominal wall in the midline, this is healing appropriately, depth minimal, output serous, no erythema   Assessment/Plan: This is a 58 y.o. male 7 days s/p incision and drainage of abdominal wall abscess    - We will transition to superficial dressing daily; okay to shower  - Complete Abx as prescribed; reviewed Cx  - I will see him in ~1-2 weeks for wound check; He understands to call with questions/concerns in there interim  -- [name], PA-C [facility] 08/04/2023, 1:52 PM M-F: 7am - 4pm

## 2023-08-07 ENCOUNTER — Other Ambulatory Visit: Payer: Self-pay | Admitting: Nurse Practitioner

## 2023-08-11 ENCOUNTER — Encounter: Payer: Self-pay | Admitting: Nurse Practitioner

## 2023-08-11 ENCOUNTER — Ambulatory Visit: Attending: Nurse Practitioner | Admitting: Nurse Practitioner

## 2023-08-11 DIAGNOSIS — M5137 Other intervertebral disc degeneration, lumbosacral region with discogenic back pain only: Secondary | ICD-10-CM | POA: Diagnosis not present

## 2023-08-11 DIAGNOSIS — G8929 Other chronic pain: Secondary | ICD-10-CM

## 2023-08-11 DIAGNOSIS — M47816 Spondylosis without myelopathy or radiculopathy, lumbar region: Secondary | ICD-10-CM

## 2023-08-11 DIAGNOSIS — Z79899 Other long term (current) drug therapy: Secondary | ICD-10-CM | POA: Diagnosis present

## 2023-08-11 DIAGNOSIS — M545 Low back pain, unspecified: Secondary | ICD-10-CM | POA: Diagnosis present

## 2023-08-11 DIAGNOSIS — Z79891 Long term (current) use of opiate analgesic: Secondary | ICD-10-CM

## 2023-08-11 DIAGNOSIS — M79671 Pain in right foot: Secondary | ICD-10-CM | POA: Insufficient documentation

## 2023-08-11 DIAGNOSIS — G894 Chronic pain syndrome: Secondary | ICD-10-CM | POA: Diagnosis present

## 2023-08-11 DIAGNOSIS — M79672 Pain in left foot: Secondary | ICD-10-CM | POA: Diagnosis present

## 2023-08-11 MED ORDER — HYDROCODONE-ACETAMINOPHEN 5-325 MG PO TABS: 1.0000 | ORAL_TABLET | Freq: Two times a day (BID) | ORAL | 0 refills | Status: DC | PRN

## 2023-08-11 NOTE — Progress Notes (Signed)
 PROVIDER NOTE: Interpretation of information contained herein should be left to medically-trained personnel. Specific patient instructions are provided elsewhere under "Patient Instructions" section of medical record. This document was created in part using AI and STT-dictation technology, any transcriptional errors that may result from this process are unintentional.  Patient: Darren Allen  Service: E/M   PCP: Aileen Alexanders, NP  DOB: 16-Nov-1965  DOS: 08/11/2023  Provider: Cherylin Corrigan, NP  MRN: [id number]  Delivery: Face-to-face  Specialty: Interventional Pain Management  Type: Established Patient  Setting: Ambulatory outpatient facility  Specialty designation: 09  Referring Prov.: Aileen Alexanders, NP  Location: Outpatient office facility       History of present illness (HPI) [name], a 58 y.o. year old male, is here today because of his No primary diagnosis found.. [name] primary complain today is Back Pain and Peripheral Neuropathy (Feet - pain score 7 today)   Pain Assessment: Severity of Chronic pain is reported as a 1 /10. Location: Back Lower, Right/denies. Onset: More than a month ago. Quality: Dull, Throbbing. Timing: Intermittent. Modifying factor(s): procedure. Vitals:  height is 6\' 2"  (1.88 m) and weight is 243 lb (110.2 kg). His temperature is 98.6 F (37 C). His blood pressure is 129/73 and his pulse is 94. His respiration is 18 and oxygen  saturation is 99%.  BMI: Estimated body mass index is 31.2 kg/m as calculated from the following:   Height as of this encounter: 6\' 2"  (1.88 m).   Weight as of this encounter: 243 lb (110.2 kg).  Last encounter: Visit date not found. Last procedure: Visit date not found.  Reason for encounter: both, medication management and post-procedure evaluation and assessment.  The patient reports doing well with current medication regimen.  No adverse reaction or side effects reported to medication.   The patient reports  experiencing 100% pain relief and functional improvement during the period of local anesthetic efficacy.  Following the resolution of the anesthetic effect, he continues to experience approximately 80% sustained improvement in both pain relief and functional capacity.  The patient reports that he localizes area of tenderness in the right lower lumbar region, which has been intermittently painful, particularly with heavy lifting and bending since the procedure.  He also reports recent abdominal access site removal from last week with no associated complication.  Procedure Procedure: Lumbar Facet, Medial Branch Radiofrequency Ablation (RFA) #1 (NO STEROIDS) Laterality: Right (-RT)  Level: L2, L3, L4, L5, and S1 Medial Branch Level(s). These levels will denervate the L3-4, L4-5, and L5-S1 lumbar facet joints.  Imaging: Fluoroscopy-guided Spinal ([id number]) Anesthesia: Local anesthesia (1-2% Lidocaine ) Anxiolysis: IV Versed  4.0 mg Sedation:   Fentanyl  1 mL (50 mcg) DOS: 07/13/2023  Performed by: Candi Chafe, MD   Purpose: Therapeutic/Palliative Indications: Low back pain severe enough to impact quality of life or function. Indications: 1. Chronic low back pain (1ry area of Pain) (Bilateral) (R>L) w/o sciatica   2. DDD (degenerative disc disease), lumbosacral w/ LBP   3. Lumbar facet joint pain   4. Lumbar facet syndrome   5. Spondylosis without myelopathy or radiculopathy, lumbosacral region   6. Low back pain of over 3 months duration   7. Mechanical low back pain   8. Recurrent low back pain   9. Abnormal MRI, lumbar spine (03/11/2020)     [name] has been dealing with the above chronic pain for longer than three months and has either failed to respond, was unable to tolerate, or simply did  not get enough benefit from other more conservative therapies including, but not limited to: 1. Over-the-counter medications 2. Anti-inflammatory medications 3. Muscle relaxants 4.  Membrane stabilizers 5. Opioids 6. Physical therapy and/or chiropractic manipulation 7. Modalities (Heat, ice, etc.) 8. Invasive techniques such as nerve blocks. [name] has attained more than 50% relief of the pain from a series of diagnostic injections conducted in separate occasions.   Pain Score: Pre-procedure: 6 /10 Post-procedure: 0-No pain/10  Post-Procedure Evaluation   Effectiveness:  Initial hour after procedure: 100 % . Subsequent 4-6 hours post-procedure: 100 % . Analgesia past initial 6 hours: 80 % . Ongoing improvement:  Analgesic: [name] underwent a therapeutic/palliative Lumbar Facet Radiofrequency Ablation (RFA) without steroid administration on July 13, 2023.  The patient reports experiencing 100% pain relief and functional improvement during the period of local anesthetic efficacy.  Following the resolution of the anesthetic effect, he continues to experience approximately 80% sustained improvement in both pain relief and functional capacity. Function: [name] reports improvement in function ROM: [name] reports improvement in ROM  Pharmacotherapy Assessment   Analgesic: Hydrocodone -acetaminophen  (Norco/Vicodin) 5-325 mg tablet 2 times daily as needed for pain. MME=10 Monitoring: [place] PMP: PDMP reviewed during this encounter.       Pharmacotherapy: No side-effects or adverse reactions reported. Compliance: No problems identified. Effectiveness: Clinically acceptable.  Darren Rabon, RN  08/11/2023 10:36 AM  Sign when Signing Visit Nursing Pain Medication Assessment:  Safety precautions to be maintained throughout the outpatient stay will include: orient to surroundings, keep bed in low position, maintain call bell within reach at all times, provide assistance with transfer out of bed and ambulation.  Medication Inspection Compliance: Pill count conducted under aseptic conditions, in front of the patient. Neither the pills nor the bottle was removed from  the patient's sight at any time. Once count was completed pills were immediately returned to the patient in their original bottle.  Medication: Hydrocodone /APAP Pill/Patch Count: 37 of 60 pills/patches remain Pill/Patch Appearance: Markings consistent with prescribed medication Bottle Appearance: Standard pharmacy container. Clearly labeled. Filled Date: 5 / 20 / 2025 Last Medication intake:  Today  No results found for: "CBDTHCR" No results found for: "D8THCCBX" No results found for: "D9THCCBX"  UDS:  Summary  Date Value Ref Range Status  08/24/2022 Note  Final    Comment:    ==================================================================== [employer]ox[employer] Select 13 (MW) ==================================================================== Test                             Result       Flag       Units  Drug Present and Declared for Prescription Verification   Hydrocodone                     312          EXPECTED   ng/mg creat   Norhydrocodone                 398          EXPECTED   ng/mg creat    Sources of hydrocodone  include scheduled prescription medications.    Norhydrocodone is an expected metabolite of hydrocodone .  ==================================================================== Test                      Result    Flag   Units      Ref Range   Creatinine  161              mg/dL      >=91 ==================================================================== Declared Medications:  The flagging and interpretation on this report are based on the  following declared medications.  Unexpected results may arise from  inaccuracies in the declared medications.   **Note: The testing scope of this panel includes these medications:   Hydrocodone  (Norco)   **Note: The testing scope of this panel does not include the  following reported medications:   Acetaminophen  (Tylenol )  Acetaminophen  (Norco)  Albuterol  (Proair  HFA)  Albuterol  (Duoneb)  Amitriptyline   (Elavil )  Epinephrine  (EpiPen )  Fluticasone  (Trelegy)  Fluticasone  (Flonase )  Gabapentin  (Neurontin )  Hydrochlorothiazide  (Hydrodiuril )  Insulin  (Humulin)  Ipratropium (Duoneb)  Iron  Lisinopril  (Zestril )  Magnesium  (Mag-Ox)  Meloxicam  (Mobic )  Metformin  (Glucophage )  Montelukast  (Singulair )  Naloxone  (Narcan )  Ondansetron  (Zofran )  Prednisone  (Deltasone )  Rosuvastatin  (Crestor )  Semaglutide  Sulfamethoxazole  (Bactrim )  Tadalafil  (Cialis )  Tamsulosin  (Flomax )  Testosterone   Triamcinolone  (Kenalog )  Trimethoprim  (Bactrim )  Umeclidinium (Trelegy)  Vilanterol (Trelegy)  Vitamin C ==================================================================== For clinical consultation, please call [phone]. ====================================================================      ROS  Constitutional: Denies any fever or chills Gastrointestinal: No reported hemesis, hematochezia, vomiting, or acute GI distress Musculoskeletal: Back pain, bilateral feet pain Neurological: No reported episodes of acute onset apraxia, aphasia, dysarthria, agnosia, amnesia, paralysis, loss of coordination, or loss of consciousness  Medication Review  HYDROcodone -acetaminophen , Magnesium  Oxide -Mg Supplement, NEEDLE (DISP) 18 G, NEEDLE (DISP) 21 G, PRESCRIPTION MEDICATION, albuterol , amitriptyline , ascorbic acid, budesonide -glycopyrrolate -formoterol , clotrimazole -betamethasone , cyanocobalamin , dapagliflozin  propanediol, ferrous sulfate , fluticasone , gabapentin , hydrochlorothiazide , icosapent Ethyl, insulin  isophane & regular human KwikPen, ipratropium, ipratropium-albuterol , lisinopril , metFORMIN , metroNIDAZOLE , montelukast , naloxone , pantoprazole , rosuvastatin , sulfamethoxazole -trimethoprim , tazarotene , terbinafine , testosterone  cypionate, and tirzepatide  History Review  Allergy: Mr. Diiorio is allergic to glipizide, ciprofloxacin , cephalexin , duloxetine, and dupixent [dupilumab]. Drug: Mr. Lopezgarcia   reports current drug use. Drug: Hydrocodone . Alcohol:  reports that he does not currently use alcohol after a past usage of about 12.0 standard drinks of alcohol per week. Tobacco:  reports that he has quit smoking. His smoking use included cigarettes. He has a 17 pack-year smoking history. He has been exposed to tobacco smoke. He uses smokeless tobacco. Social: Mr. Fultz  reports that he has quit smoking. His smoking use included cigarettes. He has a 17 pack-year smoking history. He has been exposed to tobacco smoke. He uses smokeless tobacco. He reports that he does not currently use alcohol after a past usage of about 12.0 standard drinks of alcohol per week. He reports current drug use. Drug: Hydrocodone . Medical:  has a past medical history of Allergy, Asthma, Chronic lower back pain, COPD (chronic obstructive pulmonary disease) (HCC), Coronary artery disease, DDD (degenerative disc disease), lumbosacral, Depression, Diabetic peripheral neuropathy (HCC), Diastolic dysfunction (12/27/2020), Diverticulosis, Erectile dysfunction, GERD (gastroesophageal reflux disease), Hepatic steatosis, History of kidney stones, Hyperlipidemia, Hypertension, Hypogonadism male, IDA (iron deficiency anemia), Long term current use of opiate analgesic, OSA on CPAP, Paraesophageal hernia, PVD (peripheral vascular disease) (HCC), T2DM (type 2 diabetes mellitus) (HCC), Umbilical hernia, and Ventral hernia. Surgical: Mr. Gehrig  has a past surgical history that includes Appendectomy; Incision and drainage perirectal abscess (N/A, 02/04/2015); Rectal exam under anesthesia (02/04/2015); ORIF ankle fracture (Left, 03/23/2017); Syndesmosis repair (Left, 03/23/2017); Colonoscopy with propofol  (N/A, 12/31/2021); Esophagogastroduodenoscopy (N/A, 12/31/2021); Kidney stone surgery (Right); lung mass removal (N/A); Xi robotic assisted paraesophageal hernia repair (N/A, 01/20/2022); Insertion of mesh (01/20/2022); Umbilical hernia repair  (01/20/2022); Vasectomy; Circumcision; Bronchoscopy (2016); Muscle biopsy; Ventral hernia repair (  N/A, 04/08/2023); and Insertion of mesh (N/A, 04/08/2023). Family: family history includes Alcohol abuse in his father; Cancer in his maternal grandfather and paternal aunt; Colon cancer (age of onset: 73) in his father; Diabetes in his brother; Esophageal cancer (age of onset: 25) in his brother; Heart disease in his brother and father; Lung cancer in his maternal aunt; Lung cancer (age of onset: 44) in his mother.  Laboratory Chemistry Profile   Renal Lab Results  Component Value Date   BUN 19 07/28/2023   CREATININE 1.38 (H) 07/29/2023   BCR 11 02/25/2023   GFRAA 59 (L) 12/07/2019   GFRNONAA 59 (L) 07/29/2023    Hepatic Lab Results  Component Value Date   AST 34 07/28/2023   ALT 38 07/28/2023   ALBUMIN  4.0 07/28/2023   ALKPHOS 46 07/28/2023   AMYLASE 93 02/04/2022   LIPASE 43 07/28/2023    Electrolytes Lab Results  Component Value Date   NA 137 07/28/2023   K 3.7 07/28/2023   CL 105 07/28/2023   CALCIUM  8.9 07/28/2023   MG 1.8 01/25/2022   PHOS 2.3 (L) 01/23/2022    Bone Lab Results  Component Value Date   25OHVITD1 44 09/08/2021   25OHVITD2 <1.0 09/08/2021   25OHVITD3 43 09/08/2021   TESTOSTERONE  575 02/23/2023    Inflammation (CRP: Acute Phase) (ESR: Chronic Phase) Lab Results  Component Value Date   CRP 1.8 (H) 09/08/2021   ESRSEDRATE 17 09/08/2021   LATICACIDVEN 1.4 07/28/2023         Note: Above Lab results reviewed.  Recent Imaging Review  CT ABDOMEN PELVIS W CONTRAST CLINICAL DATA:  Abdominal pain. Erythema over the hernia repair site.  EXAM: CT ABDOMEN AND PELVIS WITH CONTRAST  TECHNIQUE: Multidetector CT imaging of the abdomen and pelvis was performed using the standard protocol following bolus administration of intravenous contrast.  RADIATION DOSE REDUCTION: This exam was performed according to the departmental dose-optimization program which  includes automated exposure control, adjustment of the mA and/or kV according to patient size and/or use of iterative reconstruction technique.  CONTRAST:  OMNIPAQUE  IOHEXOL  300 MG/ML  SOLN  COMPARISON:  CT abdomen pelvis dated 03/04/2023.  FINDINGS: Lower chest: The visualized lung bases are clear.  No intra-abdominal free air or free fluid.  Hepatobiliary: The liver is unremarkable. No biliary dilatation. The gallbladder is unremarkable  Pancreas: Unremarkable. No pancreatic ductal dilatation or surrounding inflammatory changes.  Spleen: Normal in size without focal abnormality.  Adrenals/Urinary Tract: The adrenal glands are unremarkable. There is no hydronephrosis on either side. There is symmetric enhancement and excretion of contrast by both kidneys. There is a 4 mm stone along the left posterior bladder wall at the ureterovesical junction. The urinary bladder is unremarkable.  Stomach/Bowel: There is sigmoid diverticulosis. There is no bowel obstruction or active inflammation. Appendectomy.  Vascular/Lymphatic: Moderate aortoiliac atherosclerotic disease. The IVC is unremarkable. No portal venous gas. There is no adenopathy.  Reproductive: The prostate and seminal vesicles are grossly unremarkable. No pelvic mass.  Other: There is a 1.5 x 4.0 cm loculated collection containing air and fluid in the anterior abdomen along the hernia repair. It is difficult to determine whether this collection is superficial or deep to the hernia repair. Additional similar sized collection in the subcutaneous soft tissues at the same level with surrounding inflammatory changes. These two collections likely communicate with each other.  Musculoskeletal: No acute osseous pathology.  IMPRESSION: 1. Infected collections or abscesses in the anterior abdomen along the hernia repair as  above. 2. A 4 mm stone along the left posterior bladder wall at the ureterovesical junction. No  hydronephrosis. 3. Sigmoid diverticulosis. No bowel obstruction. 4.  Aortic Atherosclerosis (ICD10-I70.0).  Electronically Signed   By: Angus Bark M.D.   On: 07/28/2023 12:35 Note: Reviewed         Physical Exam  General appearance: Well nourished, well developed, and well hydrated. In no apparent acute distress Mental status: Alert, oriented x 3 (person, place, & time)       Respiratory: No evidence of acute respiratory distress Eyes: PERLA Vitals: BP 129/73   Pulse 94   Temp 98.6 F (37 C)   Resp 18   Ht 6\' 2"  (1.88 m)   Wt 243 lb (110.2 kg)   SpO2 99%   BMI 31.20 kg/m  BMI: Estimated body mass index is 31.2 kg/m as calculated from the following:   Height as of this encounter: 6\' 2"  (1.88 m).   Weight as of this encounter: 243 lb (110.2 kg). Ideal: Ideal body weight: 82.2 kg (181 lb 3.5 oz) Adjusted ideal body weight: 93.4 kg (205 lb 14.9 oz)  Assessment   Diagnosis Status  1. Chronic low back pain (1ry area of Pain) (Bilateral) (R>L) w/o sciatica   2. Chronic feet pain (2ry area of Pain) (Bilateral)   3. Degeneration of intervertebral disc of lumbosacral region with discogenic back pain   4. Lumbar facet joint syndrome   5. Chronic pain syndrome   6. Pharmacologic therapy   7. Chronic use of opiate for therapeutic purpose   8. Encounter for medication management   9. Encounter for chronic pain management    Controlled Controlled Controlled   Updated Problems: No problems updated.  Plan of Care  Problem-specific:  Assessment and Plan We will continue on current medication regimen.  Prescribing drug monitoring (PDMP) reviewed; findings consistent with use of prescribed medication and no evidence of narcotic misuse or abuse. Routine UDS ordered today.  Schedule follow-up in 90 days.   The patient was advised to monitor the right lower back tenderness.  If the pain does not improve or worsens, he should schedule a follow-up appointment with Dr. Naveira for  further evaluation.    Mr. GARRETH BURNSWORTH has a current medication list which includes the following long-term medication(s): ferrous sulfate , fluticasone , hydrochlorothiazide , humulin 70/30 kwikpen, ipratropium, lisinopril , metformin , montelukast , rosuvastatin , testosterone  cypionate, vascepa, [START ON 08/25/2023] hydrocodone -acetaminophen , [START ON 09/24/2023] hydrocodone -acetaminophen , and [START ON 10/24/2023] hydrocodone -acetaminophen .  Pharmacotherapy (Medications Ordered): Meds ordered this encounter  Medications   HYDROcodone -acetaminophen  (NORCO/VICODIN) 5-325 MG tablet    Sig: Take 1 tablet by mouth 2 (two) times daily as needed. Must last 30 days.    Dispense:  60 tablet    Refill:  0    DO NOT: delete (not duplicate); no partial-fill (will deny script to complete), no refill request (F/U required). DISPENSE: 1 day early if closed on fill date. WARN: No CNS-depressants within 8 hrs of med.   HYDROcodone -acetaminophen  (NORCO/VICODIN) 5-325 MG tablet    Sig: Take 1 tablet by mouth 2 (two) times daily as needed. Must last 30 days.    Dispense:  60 tablet    Refill:  0    DO NOT: delete (not duplicate); no partial-fill (will deny script to complete), no refill request (F/U required). DISPENSE: 1 day early if closed on fill date. WARN: No CNS-depressants within 8 hrs of med.   HYDROcodone -acetaminophen  (NORCO/VICODIN) 5-325 MG tablet    Sig: Take 1 tablet  by mouth 2 (two) times daily as needed. Must last 30 days.    Dispense:  60 tablet    Refill:  0    DO NOT: delete (not duplicate); no partial-fill (will deny script to complete), no refill request (F/U required). DISPENSE: 1 day early if closed on fill date. WARN: No CNS-depressants within 8 hrs of med.   Orders:  Orders Placed This Encounter  Procedures   ToxASSURE Select 13 (MW), Urine    Volume: 30 ml(s). Minimum 3 ml of urine is needed. Document temperature of fresh sample. Indications: Long term (current) use of opiate  analgesic (I69.629)    Release to patient:   Immediate        Return in about 3 months (around 11/11/2023) for (F2F), (MM), Marthe Slain NP.    Recent Visits Date Type Provider Dept  07/13/23 Procedure visit Renaldo Caroli, MD [facility]  06/22/23 Procedure visit Renaldo Caroli, MD [facility]  06/07/23 Office Visit Renaldo Caroli, MD [facility]  05/25/23 Procedure visit Renaldo Caroli, MD [facility]  05/19/23 Office Visit Renaldo Caroli, MD [facility]  Showing recent visits within past 90 days and meeting all other requirements Today's Visits Date Type Provider Dept  08/11/23 Office Visit Shantanu Strauch K, NP [facility]  Showing today's visits and meeting all other requirements Future Appointments Date Type Provider Dept  08/24/23 Appointment Renaldo Caroli, MD [facility]  Showing future appointments within next 90 days and meeting all other requirements  I discussed the assessment and treatment plan with the patient. The patient was provided an opportunity to ask questions and all were answered. The patient agreed with the plan and demonstrated an understanding of the instructions.  Patient advised to call back or seek an in-person evaluation if the symptoms or condition worsens.  Duration of encounter: 30 minutes.  Total time on encounter, as per AMA guidelines included both the face-to-face and non-face-to-face time personally spent by the physician and/or other qualified health care professional(s) on the day of the encounter (includes time in activities that require the physician or other qualified health care professional and does not include time in activities normally performed by clinical staff). Physician's time may include the following activities when performed: Preparing to see the patient (e.g., pre-charting review of records, searching for previously ordered imaging, lab  work, and nerve conduction tests) Review of prior analgesic pharmacotherapies. Reviewing PMP Interpreting ordered tests (e.g., lab work, imaging, nerve conduction tests) Performing post-procedure evaluations, including interpretation of diagnostic procedures Obtaining and/or reviewing separately obtained history Performing a medically appropriate examination and/or evaluation Counseling and educating the patient/family/caregiver Ordering medications, tests, or procedures Referring and communicating with other health care professionals (when not separately reported) Documenting clinical information in the electronic or other health record Independently interpreting results (not separately reported) and communicating results to the patient/ family/caregiver Care coordination (not separately reported)  Note by: Lilyannah Zuelke K Delmus Warwick, NP (TTS and AI technology used. I apologize for any typographical errors that were not detected and corrected.) Date: 08/11/2023; Time: 11:27 AM

## 2023-08-11 NOTE — Telephone Encounter (Signed)
 Requested Prescriptions  Pending Prescriptions Disp Refills   hydrochlorothiazide  (HYDRODIURIL ) 25 MG tablet [Pharmacy Med Name: HYDROCHLOROTHIAZIDE  25 MG TAB] 90 tablet 0    Sig: Take 1 tablet (25 mg total) by mouth daily.     Cardiovascular: Diuretics - Thiazide Failed - 08/11/2023  9:25 AM      Failed - Cr in normal range and within 180 days    Creatinine  Date Value Ref Range Status  03/13/2014 1.08 0.60 - 1.30 mg/dL Final   Creatinine, Ser  Date Value Ref Range Status  07/29/2023 1.38 (H) 0.61 - 1.24 mg/dL Final         Passed - K in normal range and within 180 days    Potassium  Date Value Ref Range Status  07/28/2023 3.7 3.5 - 5.1 mmol/L Final  03/13/2014 4.4 3.5 - 5.1 mmol/L Final         Passed - Na in normal range and within 180 days    Sodium  Date Value Ref Range Status  07/28/2023 137 135 - 145 mmol/L Final  02/25/2023 137 134 - 144 mmol/L Final  03/13/2014 135 (L) 136 - 145 mmol/L Final         Passed - Last BP in normal range    BP Readings from Last 1 Encounters:  08/04/23 117/73         Passed - Valid encounter within last 6 months    Recent Outpatient Visits           2 months ago Chronic obstructive pulmonary disease, unspecified COPD type (HCC)   [facility] Aileen Alexanders, NP   3 months ago Recurrent boils   [facility] Aileen Alexanders, NP       Future Appointments             In 1 month Harris Liming, MD [facility]

## 2023-08-11 NOTE — Progress Notes (Signed)
 Nursing Pain Medication Assessment:  Safety precautions to be maintained throughout the outpatient stay will include: orient to surroundings, keep bed in low position, maintain call bell within reach at all times, provide assistance with transfer out of bed and ambulation.  Medication Inspection Compliance: Pill count conducted under aseptic conditions, in front of the patient. Neither the pills nor the bottle was removed from the patient's sight at any time. Once count was completed pills were immediately returned to the patient in their original bottle.  Medication: Hydrocodone /APAP Pill/Patch Count: 37 of 60 pills/patches remain Pill/Patch Appearance: Markings consistent with prescribed medication Bottle Appearance: Standard pharmacy container. Clearly labeled. Filled Date: 5 / 12 / 2025 Last Medication intake:  Today

## 2023-08-14 LAB — TOXASSURE SELECT 13 (MW), URINE

## 2023-08-16 ENCOUNTER — Other Ambulatory Visit: Payer: Self-pay

## 2023-08-16 ENCOUNTER — Encounter
Admission: RE | Disposition: A | Discharge status: Home / Self Care | Payer: Self-pay | Source: Ambulatory Visit | Attending: Internal Medicine

## 2023-08-16 ENCOUNTER — Encounter: Admitting: Nurse Practitioner

## 2023-08-16 ENCOUNTER — Encounter: Payer: Self-pay | Admitting: Internal Medicine

## 2023-08-16 ENCOUNTER — Encounter: Admitting: Pain Medicine

## 2023-08-16 ENCOUNTER — Ambulatory Visit
Admission: RE | Admit: 2023-08-16 | Discharge: 2023-08-16 | Disposition: A | Discharge status: Home / Self Care | Source: Ambulatory Visit | Attending: Internal Medicine | Admitting: Internal Medicine

## 2023-08-16 DIAGNOSIS — I2584 Coronary atherosclerosis due to calcified coronary lesion: Secondary | ICD-10-CM | POA: Diagnosis not present

## 2023-08-16 DIAGNOSIS — I2510 Atherosclerotic heart disease of native coronary artery without angina pectoris: Secondary | ICD-10-CM | POA: Diagnosis not present

## 2023-08-16 DIAGNOSIS — R9430 Abnormal result of cardiovascular function study, unspecified: Secondary | ICD-10-CM

## 2023-08-16 HISTORY — PX: LEFT HEART CATH AND CORONARY ANGIOGRAPHY: CATH118249

## 2023-08-16 LAB — CARDIAC CATHETERIZATION: Cath EF Quantitative: 60 %

## 2023-08-16 LAB — GLUCOSE, CAPILLARY
Glucose-Capillary: 197 mg/dL — ABNORMAL HIGH (ref 70–99)
Glucose-Capillary: 128 mg/dL — ABNORMAL HIGH (ref 70–99)

## 2023-08-16 SURGERY — LEFT HEART CATH AND CORONARY ANGIOGRAPHY
Anesthesia: Moderate Sedation | Laterality: Left

## 2023-08-16 MED ORDER — HEPARIN SODIUM (PORCINE) 1000 UNIT/ML IJ SOLN
INTRAMUSCULAR | Status: AC
  Filled 2023-08-16: qty 10

## 2023-08-16 MED ORDER — SODIUM CHLORIDE 0.9% FLUSH: 3.0000 mL | Freq: Two times a day (BID) | INTRAVENOUS | Status: DC

## 2023-08-16 MED ORDER — HEPARIN (PORCINE) IN NACL 1000-0.9 UT/500ML-% IV SOLN
INTRAVENOUS | Status: AC
  Filled 2023-08-16: qty 1000

## 2023-08-16 MED ORDER — VERAPAMIL HCL 2.5 MG/ML IV SOLN
INTRAVENOUS | Status: AC
  Filled 2023-08-16: qty 2

## 2023-08-16 MED ORDER — SODIUM CHLORIDE 0.9 % WEIGHT BASED INFUSION: 1.0000 mL/kg/h | INTRAVENOUS | Status: DC

## 2023-08-16 MED ORDER — SODIUM CHLORIDE 0.9 % WEIGHT BASED INFUSION
3.0000 mL/kg/h | INTRAVENOUS | Status: AC
  Administered 2023-08-16: 3 mL/kg/h via INTRAVENOUS

## 2023-08-16 MED ORDER — SODIUM CHLORIDE 0.9 % IV SOLN
250.0000 mL | INTRAVENOUS | Status: DC | PRN
Start: 2023-08-16 — End: 2023-08-16

## 2023-08-16 MED ORDER — SODIUM CHLORIDE 0.9% FLUSH
3.0000 mL | INTRAVENOUS | Status: DC | PRN
Start: 2023-08-16 — End: 2023-08-16

## 2023-08-16 MED ORDER — VERAPAMIL HCL 2.5 MG/ML IV SOLN
INTRAVENOUS | Status: DC | PRN
Start: 2023-08-16 — End: 2023-08-16
  Administered 2023-08-16: 2.5 mg via INTRA_ARTERIAL

## 2023-08-16 MED ORDER — MIDAZOLAM HCL 2 MG/2ML IJ SOLN
INTRAMUSCULAR | Status: DC | PRN
  Administered 2023-08-16: 1 mg via INTRAVENOUS

## 2023-08-16 MED ORDER — FENTANYL CITRATE (PF) 100 MCG/2ML IJ SOLN
INTRAMUSCULAR | Status: DC | PRN
  Administered 2023-08-16: 25 ug via INTRAVENOUS

## 2023-08-16 MED ORDER — FENTANYL CITRATE (PF) 100 MCG/2ML IJ SOLN
INTRAMUSCULAR | Status: AC
Start: 2023-08-16 — End: ?
  Filled 2023-08-16: qty 2

## 2023-08-16 MED ORDER — LIDOCAINE HCL 1 % IJ SOLN
INTRAMUSCULAR | Status: AC
Start: 2023-08-16 — End: ?
  Filled 2023-08-16: qty 20

## 2023-08-16 MED ORDER — SODIUM CHLORIDE 0.9% FLUSH: 3.0000 mL | INTRAVENOUS | Status: DC | PRN

## 2023-08-16 MED ORDER — HEPARIN SODIUM (PORCINE) 1000 UNIT/ML IJ SOLN
INTRAMUSCULAR | Status: DC | PRN
  Administered 2023-08-16: 4000 [IU] via INTRAVENOUS

## 2023-08-16 MED ORDER — MIDAZOLAM HCL 2 MG/2ML IJ SOLN
INTRAMUSCULAR | Status: AC
  Filled 2023-08-16: qty 2

## 2023-08-16 MED ORDER — LIDOCAINE HCL (PF) 1 % IJ SOLN
INTRAMUSCULAR | Status: DC | PRN
  Administered 2023-08-16: 2 mL

## 2023-08-16 MED ORDER — IOHEXOL 300 MG/ML  SOLN
INTRAMUSCULAR | Status: DC | PRN
  Administered 2023-08-16: 70 mL

## 2023-08-16 MED ORDER — ASPIRIN 81 MG PO CHEW: 81.0000 mg | CHEWABLE_TABLET | ORAL | Status: DC

## 2023-08-16 MED ORDER — HEPARIN (PORCINE) IN NACL 1000-0.9 UT/500ML-% IV SOLN
INTRAVENOUS | Status: DC | PRN
  Administered 2023-08-16 (×2): 500 mL

## 2023-08-16 SURGICAL SUPPLY — 9 items
CATH 5FR JL3.5 JR4 ANG PIG MP (CATHETERS) IMPLANT
DEVICE RAD TR BAND REGULAR (VASCULAR PRODUCTS) IMPLANT
DRAPE BRACHIAL (DRAPES) IMPLANT
GLIDESHEATH SLEND SS 6F .021 (SHEATH) IMPLANT
GUIDEWIRE INQWIRE 1.5J.035X260 (WIRE) IMPLANT
PACK CARDIAC CATH (CUSTOM PROCEDURE TRAY) ×1 IMPLANT
SET ATX-X65L (MISCELLANEOUS) IMPLANT
SHIELD CATH-GARD CONTAMINATION (MISCELLANEOUS) IMPLANT
STATION PROTECTION PRESSURIZED (MISCELLANEOUS) IMPLANT

## 2023-08-16 NOTE — Discharge Instructions (Signed)
 Radial Site Care Refer to this sheet in the next few weeks. These instructions provide you with information about caring for yourself after your procedure. Your health care provider may also give you more specific instructions. Your treatment has been planned according to current medical practices, but problems sometimes occur. Call your health care provider if you have any problems or questions after your procedure. What can I expect after the procedure? After your procedure, it is typical to have the following: Bruising at the radial site that usually fades within 1-2 weeks. Blood collecting in the tissue (hematoma) that may be painful to the touch. It should usually decrease in size and tenderness within 1-2 weeks.  Follow these instructions at home: Take medicines only as directed by your health care provider. If you are on a medication called Metformin please do not take for 48 hours after your procedure. Over the next 48hrs please increase your fluid intake of water and non caffeine beverages to flush the contrast dye out of your system.  You may shower 24 hours after the procedure  Leave your bandage on and gently wash the site with plain soap and water. Pat the area dry with a clean towel. Do not rub the site, because this may cause bleeding.  Remove your dressing 48hrs after your procedure and leave open to air.  Do not submerge your site in water for 7 days. This includes swimming and washing dishes.  Check your insertion site every day for redness, swelling, or drainage. Do not apply powder or lotion to the site. Do not flex or bend the affected arm for 24 hours or as directed by your health care provider. Do not push or pull heavy objects with the affected arm for 24 hours or as directed by your health care provider. Do not lift over 10 lb (4.5 kg) for 5 days after your procedure or as directed by your health care provider. Ask your health care provider when it is okay to: Return to  work or school. Resume usual physical activities or sports. Resume sexual activity. Do not drive home if you are discharged the same day as the procedure. Have someone else drive you. You may drive 48 hours after the procedure Do not operate machinery or power tools for 24 hours after the procedure. If your procedure was done as an outpatient procedure, which means that you went home the same day as your procedure, a responsible adult should be with you for the first 24 hours after you arrive home. Keep all follow-up visits as directed by your health care provider. This is important. Contact a health care provider if: You have a fever. You have chills. You have increased bleeding from the radial site. Hold pressure on the site. Get help right away if: You have unusual pain at the radial site. You have redness, warmth, or swelling at the radial site. You have drainage (other than a small amount of blood on the dressing) from the radial site. The radial site is bleeding, and the bleeding does not stop after 15 minutes of holding steady pressure on the site. Your arm or hand becomes pale, cool, tingly, or numb. This information is not intended to replace advice given to you by your health care provider. Make sure you discuss any questions you have with your health care provider. Document Released: 04/04/2010 Document Revised: 08/08/2015 Document Reviewed: 09/18/2013 Elsevier Interactive Patient Education  2018 [employer].

## 2023-08-17 ENCOUNTER — Encounter: Payer: Self-pay | Admitting: Internal Medicine

## 2023-08-18 ENCOUNTER — Encounter: Payer: Self-pay | Admitting: Physician Assistant

## 2023-08-18 ENCOUNTER — Ambulatory Visit (INDEPENDENT_AMBULATORY_CARE_PROVIDER_SITE_OTHER): Admitting: Physician Assistant

## 2023-08-18 VITALS — BP 123/76 | HR 85 | Temp 98.6°F | Ht 74.0 in | Wt 243.2 lb

## 2023-08-18 DIAGNOSIS — L02211 Cutaneous abscess of abdominal wall: Secondary | ICD-10-CM

## 2023-08-18 DIAGNOSIS — L929 Granulomatous disorder of the skin and subcutaneous tissue, unspecified: Secondary | ICD-10-CM

## 2023-08-18 DIAGNOSIS — Z09 Encounter for follow-up examination after completed treatment for conditions other than malignant neoplasm: Secondary | ICD-10-CM

## 2023-08-18 NOTE — Progress Notes (Signed)
 [facility] POST-OP OFFICE VISIT  08/18/2023  HPI: Darren Allen is a 58 y.o. male 21 days s/p incision and drainage of abdominal wall abscess   He has done well Wound has nearly closed; no depth Scant serous drainage No fever, chills, erythema, pain No other complaints   Of note, he did have heart catheterization Monday and has pressure dressing to right wrist. He was told to remove this today and waited to our appointment. No issues reported.   Vital signs: BP 123/76   Pulse 85   Temp 98.6 F (37 C) (Oral)   Ht 6\' 2"  (1.88 m)   Wt 243 lb 3.2 oz (110.3 kg)   SpO2 98%   BMI 31.23 kg/m    Physical Exam: Constitutional: Well appearing male, NAD Skin: Midline abdominal wound is healing well, wound bed is 100% granulation tissue, no erythema, scant serous drainage to dressing in place.  MSK: Dressing to right wrist overlaying radial artery; this was removed, no bleeding, no ecchymosis, pulse 2+  Assessment/Plan: This is a 58 y.o. male 21 days s/p incision and drainage of abdominal wall abscess    - I did apply silver nitrate to hypergranulation tissue to midline abdominal wound to facilitate scarring. He can otherwise continue superficial dressing until this closes completely.   - He can follow up on as needed basis; He understands to call with questions/concerns  -- [name], PA-C [facility] 08/18/2023, 2:07 PM M-F: 7am - 4pm

## 2023-08-18 NOTE — Patient Instructions (Signed)
 Please call the office if you have any questions or concerns   Continue to do daily dressing changes

## 2023-08-21 ENCOUNTER — Encounter: Payer: Self-pay | Admitting: Pain Medicine

## 2023-08-23 NOTE — Progress Notes (Unsigned)
 PROVIDER NOTE: Interpretation of information contained herein should be left to medically-trained personnel. Specific patient instructions are provided elsewhere under "Patient Instructions" section of medical record. This document was created in part using AI and STT-dictation technology, any transcriptional errors that may result from this process are unintentional.  Patient: Darren Allen  Service: E/M   PCP: Aileen Alexanders, NP  DOB: 27-Dec-1965  DOS: 08/24/2023  Provider: Candi Chafe, MD  MRN: [id number]  Delivery: Face-to-face  Specialty: Interventional Pain Management  Type: Established Patient  Setting: Ambulatory outpatient facility  Specialty designation: 09  Referring Prov.: Aileen Alexanders, NP  Location: Outpatient office facility       History of present illness (HPI) Darren Allen, a 58 y.o. year old male, is here today because of his Chronic bilateral low back pain without sciatica [M54.50, G89.29]. Darren Allen primary complain today is No chief complaint on file.  Pertinent problems: Darren Allen has Ankle fracture; Neuropathy; Diabetic peripheral neuropathy (HCC); Gout; Chronic ankle pain (Bilateral); Chronic low back pain (1ry area of Pain) (Bilateral) (R>L) w/o sciatica; Other acquired hammer toe; Chronic pain syndrome; Lumbar facet syndrome; Lumbosacral radiculopathy at S1 (Left); Chronic feet pain (2ry area of Pain) (Bilateral); Chronic ankle pain (Left); Abnormal NCS (nerve conduction studies) (02/20/2020); Abnormal MRI, lumbar spine (03/11/2020); Lumbosacral lateral recess stenosis (Left: L5-S1); Chronic low back pain (Bilateral) w/ sciatica (Left); Abnormal MRI, thoracic spine (07/05/2020); Chronic low back pain (Bilateral) w/ sciatica (Right); Chronic lower extremity pain (intermittent) (Right); Chronic lower extremity pain (intermittent) (Left); Lumbar radiculitis (Right); Lumbar radiculitis (Left); Lumbar facet joint pain; Spondylosis without myelopathy or  radiculopathy, lumbosacral region; DDD (degenerative disc disease), lumbosacral w/ LBP; Low back pain of over 3 months duration; Mechanical low back pain; and Recurrent low back pain on their pertinent problem list.  Pain Assessment: Severity of   is reported as a  /10. Location:    / . Onset:  . Quality:  . Timing:  . Modifying factor(s):  Aaron Aas Vitals:  vitals were not taken for this visit.  BMI: Estimated body mass index is 31.23 kg/m as calculated from the following:   Height as of 08/18/23: 6\' 2"  (1.88 m).   Weight as of 08/18/23: 243 lb 3.2 oz (110.3 kg).  Last encounter: 06/07/2023. Last procedure: 07/13/2023.  Reason for encounter: post-procedure evaluation and assessment.   Discussed the use of AI scribe software for clinical note transcription with the patient, who gave verbal consent to proceed.  History of Present Illness         Post-Procedure Evaluation   Procedure: Lumbar Facet, Medial Branch Radiofrequency Ablation (RFA) #1 (NO STEROIDS) Laterality: Right (-RT)  Level: L2, L3, L4, L5, and S1 Medial Branch Level(s). These levels will denervate the L3-4, L4-5, and L5-S1 lumbar facet joints.  Imaging: Fluoroscopy-guided Spinal ([id number]) Anesthesia: Local anesthesia (1-2% Lidocaine ) Anxiolysis: IV Versed  4.0 mg Sedation:   Fentanyl  1 mL (50 mcg) DOS: 07/13/2023  Performed by: Candi Chafe, MD  Purpose: Therapeutic/Palliative Indications: Low back pain severe enough to impact quality of life or function.  Pain Score: Pre-procedure: 6 /10 Post-procedure: 0-No pain/10    Effectiveness:  Initial hour after procedure:   ***. Subsequent 4-6 hours post-procedure:   ***. Analgesia past initial 6 hours:   ***. Ongoing improvement:  Analgesic:  *** Function:    ***    ROM:    ***      Pharmacotherapy Assessment   Analgesic: Hydrocodone /APAP 5/325 tablet, 1 tab p.o. twice daily (#60)  MME/day: 10 mg/day   Monitoring: [place] PMP: PDMP reviewed during this encounter.        Pharmacotherapy: No side-effects or adverse reactions reported. Compliance: No problems identified. Effectiveness: Clinically acceptable.  No notes on file  UDS:  Summary  Date Value Ref Range Status  08/11/2023 FINAL  Final    Comment:    ==================================================================== [employer] Select 13 (MW) ==================================================================== Test                             Result       Flag       Units  Drug Present and Declared for Prescription Verification   Hydrocodone                     417          EXPECTED   ng/mg creat   Norhydrocodone                 434          EXPECTED   ng/mg creat    Sources of hydrocodone  include scheduled prescription medications.    Norhydrocodone is an expected metabolite of hydrocodone .  ==================================================================== Test                      Result    Flag   Units      Ref Range   Creatinine              89               mg/dL      >=16 ==================================================================== Declared Medications:  The flagging and interpretation on this report are based on the  following declared medications.  Unexpected results may arise from  inaccuracies in the declared medications.   **Note: The testing scope of this panel includes these medications:   Hydrocodone    **Note: The testing scope of this panel does not include the  following reported medications:   Acetaminophen   Albuterol  (Proair  HFA)  Albuterol  (Duoneb)  Amitriptyline  (Elavil )  Betamethasone  (Lotrisone )  Budesonide  (Breztri  Aerosphere)  Clotrimazole  (Lotrisone )  Dapagliflozin  (Farxiga )  Fluticasone  (Flonase )  Formoterol  (Breztri  Aerosphere)  Gabapentin  (Neurontin )  Glycopyrrolate  (Breztri  Aerosphere)  Hydrochlorothiazide  (Hydrodiuril )  Icosapent (Vascepa)  Insulin  (Humulin)  Ipratropium (Atrovent )  Ipratropium (Duoneb)  Iron  Lisinopril   (Zestril )  Magnesium  (Mag-Ox)  Metformin  (Glucophage )  Metronidazole  (MetroGel )  Montelukast  (Singulair )  Naloxone  (Narcan )  Pantoprazole  (Protonix )  Rosuvastatin  (Crestor )  Sulfamethoxazole  (Bactrim )  Terbinafine  (Lamisil )  Testosterone   Tirzepatide (Mounjaro)  Topical  Trimethoprim  (Bactrim )  Vitamin B12  Vitamin C ==================================================================== For clinical consultation, please call [phone]. ====================================================================     No results found for: "CBDTHCR" No results found for: "D8THCCBX" No results found for: "D9THCCBX"  ROS  Constitutional: Denies any fever or chills Gastrointestinal: No reported hemesis, hematochezia, vomiting, or acute GI distress Musculoskeletal: Denies any acute onset joint swelling, redness, loss of ROM, or weakness Neurological: No reported episodes of acute onset apraxia, aphasia, dysarthria, agnosia, amnesia, paralysis, loss of coordination, or loss of consciousness  Medication Review  HYDROcodone -acetaminophen , Magnesium  Oxide -Mg Supplement, NEEDLE (DISP) 18 G, NEEDLE (DISP) 21 G, PRESCRIPTION MEDICATION, albuterol , amitriptyline , ascorbic acid, aspirin  EC, budesonide -glycopyrrolate -formoterol , clotrimazole -betamethasone , cyanocobalamin , dapagliflozin  propanediol, ferrous sulfate , fluticasone , gabapentin , hydrochlorothiazide , icosapent Ethyl, insulin  isophane & regular human KwikPen, ipratropium, ipratropium-albuterol , lisinopril , metFORMIN , metroNIDAZOLE , montelukast , naloxone , pantoprazole , rosuvastatin , tazarotene , terbinafine , testosterone  cypionate, and tirzepatide  History Review  Allergy: Darren Allen is  allergic to glipizide, ciprofloxacin , cephalexin , duloxetine, and dupixent [dupilumab]. Drug: Darren Allen  reports current drug use. Drug: Hydrocodone . Alcohol:  reports that he does not currently use alcohol after a past usage of about 12.0 standard drinks of  alcohol per week. Tobacco:  reports that he has quit smoking. His smoking use included cigarettes. He has a 17 pack-year smoking history. He has been exposed to tobacco smoke. He uses smokeless tobacco. Social: Darren Allen  reports that he has quit smoking. His smoking use included cigarettes. He has a 17 pack-year smoking history. He has been exposed to tobacco smoke. He uses smokeless tobacco. He reports that he does not currently use alcohol after a past usage of about 12.0 standard drinks of alcohol per week. He reports current drug use. Drug: Hydrocodone . Medical:  has a past medical history of Allergy, Asthma, Chronic lower back pain, COPD (chronic obstructive pulmonary disease) (HCC), Coronary artery disease, DDD (degenerative disc disease), lumbosacral, Depression, Diabetic peripheral neuropathy (HCC), Diastolic dysfunction (12/27/2020), Diverticulosis, Erectile dysfunction, GERD (gastroesophageal reflux disease), Hepatic steatosis, History of kidney stones, Hyperlipidemia, Hypertension, Hypogonadism male, IDA (iron deficiency anemia), Long term current use of opiate analgesic, OSA on CPAP, Paraesophageal hernia, PVD (peripheral vascular disease) (HCC), T2DM (type 2 diabetes mellitus) (HCC), Umbilical hernia, and Ventral hernia. Surgical: Darren Allen  has a past surgical history that includes Appendectomy; Incision and drainage perirectal abscess (N/A, 02/04/2015); Rectal exam under anesthesia (02/04/2015); ORIF ankle fracture (Left, 03/23/2017); Syndesmosis repair (Left, 03/23/2017); Colonoscopy with propofol  (N/A, 12/31/2021); Esophagogastroduodenoscopy (N/A, 12/31/2021); Kidney stone surgery (Right); lung mass removal (N/A); Xi robotic assisted paraesophageal hernia repair (N/A, 01/20/2022); Insertion of mesh (01/20/2022); Umbilical hernia repair (01/20/2022); Vasectomy; Circumcision; Bronchoscopy (2016); Muscle biopsy; Ventral hernia repair (N/A, 04/08/2023); Insertion of mesh (N/A, 04/08/2023); Hernia  repair (07/2023); and LEFT HEART CATH AND CORONARY ANGIOGRAPHY (Left, 08/16/2023). Family: family history includes Alcohol abuse in his father; Cancer in his maternal grandfather and paternal aunt; Colon cancer (age of onset: 41) in his father; Diabetes in his brother; Esophageal cancer (age of onset: 44) in his brother; Heart disease in his brother and father; Lung cancer in his maternal aunt; Lung cancer (age of onset: 65) in his mother.  Laboratory Chemistry Profile   Renal Lab Results  Component Value Date   BUN 19 07/28/2023   CREATININE 1.38 (H) 07/29/2023   BCR 11 02/25/2023   GFRAA 59 (L) 12/07/2019   GFRNONAA 59 (L) 07/29/2023    Hepatic Lab Results  Component Value Date   AST 34 07/28/2023   ALT 38 07/28/2023   ALBUMIN  4.0 07/28/2023   ALKPHOS 46 07/28/2023   AMYLASE 93 02/04/2022   LIPASE 43 07/28/2023    Electrolytes Lab Results  Component Value Date   NA 137 07/28/2023   K 3.7 07/28/2023   CL 105 07/28/2023   CALCIUM  8.9 07/28/2023   MG 1.8 01/25/2022   PHOS 2.3 (L) 01/23/2022    Bone Lab Results  Component Value Date   25OHVITD1 44 09/08/2021   25OHVITD2 <1.0 09/08/2021   25OHVITD3 43 09/08/2021   TESTOSTERONE  575 02/23/2023    Inflammation (CRP: Acute Phase) (ESR: Chronic Phase) Lab Results  Component Value Date   CRP 1.8 (H) 09/08/2021   ESRSEDRATE 17 09/08/2021   LATICACIDVEN 1.4 07/28/2023         Note: Above Lab results reviewed.  Recent Imaging Review  CARDIAC CATHETERIZATION   Prox Cx lesion is 50% stenosed.   Prox LAD lesion is 25% stenosed.   Mid LAD  lesion is 25% stenosed.   The left ventricular systolic function is normal.   LV end diastolic pressure is normal.   The left ventricular ejection fraction is 55-65% by visual estimate.   No indication for antiplatelet therapy at this time .  Conclusion Left heart cath right radial approach  Left ventriculogram normal left ventricular function EF of 60  Coronaries Separate ostia for  LAD and circumflex LAD large 25% proximal 25% mid with moderate calcification otherwise minor  irregularity Circumflex large 50% proximal minor irregularities mild calcification RCA large minor irregularities Right dominant system  Intervention deferred not indicated Patient tolerated procedure well No complications TR band applied Referred back to primary cardiologist Recommend medical therapy Note: Reviewed         Physical Exam  General appearance: Well nourished, well developed, and well hydrated. In no apparent acute distress Mental status: Alert, oriented x 3 (person, place, & time)       Respiratory: No evidence of acute respiratory distress Eyes: PERLA Vitals: There were no vitals taken for this visit. BMI: Estimated body mass index is 31.23 kg/m as calculated from the following:   Height as of 08/18/23: 6\' 2"  (1.88 m).   Weight as of 08/18/23: 243 lb 3.2 oz (110.3 kg). Ideal: Ideal body weight: 82.2 kg (181 lb 3.5 oz) Adjusted ideal body weight: 93.4 kg (206 lb 0.2 oz)  Assessment   Diagnosis Status  1. Chronic low back pain (1ry area of Pain) (Bilateral) (R>L) w/o sciatica   2. Lumbar facet joint syndrome   3. Lumbar facet joint pain   4. Low back pain of over 3 months duration   5. Recurrent low back pain   6. Postop check    Controlled Controlled Controlled   Updated Problems: No problems updated.  Plan of Care  Problem-specific:  Assessment and Plan            Darren Allen has a current medication list which includes the following long-term medication(s): ferrous sulfate , fluticasone , hydrochlorothiazide , [START ON 08/25/2023] hydrocodone -acetaminophen , [START ON 09/24/2023] hydrocodone -acetaminophen , [START ON 10/24/2023] hydrocodone -acetaminophen , humulin 70/30 kwikpen, ipratropium, lisinopril , metformin , montelukast , rosuvastatin , testosterone  cypionate, and vascepa.  Pharmacotherapy (Medications Ordered): No orders of the defined types were  placed in this encounter.  Orders:  No orders of the defined types were placed in this encounter.    Interventional Therapies  Risk Factors  Considerations:   WNL   Planned  Pending:   Therapeutic right lumbar facet RFA #1 (07/13/2023)    Under consideration:   Therapeutic bilateral lumbar facet RFA #1 (starting with the left side) Therapeutic right L5-S1 LESI #2  Possible spinal cord stimulator trial  Therapeutic left L5-S1 percutaneous discectomy with "[employer] Dekompressor" system    Completed:   Therapeutic left lumbar facet RFA x1 (06/22/2023) (100/100/75/75) (w/ steroids)  Diagnostic bilateral lumbar facet MBB x1 (03/02/2023) (100/100/95/100) (w/ steroids)  Diagnostic bilateral lumbar facet MBB x1 (03/02/2023) (80-100/80-100/0/0) (w/o steroids)  Diagnostic midline to left caudal ESI x1 (04/02/2022) (100/100/100/LBP:100/LEP:100)  Therapeutic left L5-S1 LESI x3 (08/27/2022) (1st:100/100/100/LBP:85  LEP:100) (2nd: 01/13/59/LBP:60LLEP:100)  Therapeutic right L5-S1 LESI x1 (12/24/2022) (100/85/100/LBP:100)  Therapeutic bilateral Qutenza  neurolytic treatment x1 (12/23/2021)  (09/08/2021 & 10/29/2021) referral to physical therapy for evaluation and treatment of low back pain. Physical therapy for the lower back ordered.  (06/07/2023-referral entered)    Completed by other providers:   EMG/PNCV of lower extremity (02/20/2020) by Dr. Devora Folks (generalized sensorimotor peripheral neuropathy; superimposed left S1 radiculopathy)   Therapeutic  Palliative (  PRN) options:   Diagnostic bilateral lumbar facet MBB #2 (without steroids) (PRN)     No follow-ups on file.    Recent Visits Date Type Provider Dept  08/11/23 Office Visit Patel, Seema K, NP [facility]  07/13/23 Procedure visit Renaldo Caroli, MD [facility]  06/22/23 Procedure visit Renaldo Caroli, MD [facility]  06/07/23 Office Visit Renaldo Caroli, MD [facility]   05/25/23 Procedure visit Renaldo Caroli, MD [facility]  Showing recent visits within past 90 days and meeting all other requirements Future Appointments Date Type Provider Dept  08/24/23 Appointment Renaldo Caroli, MD [facility]  11/11/23 Appointment Patel, Seema K, NP [facility]  Showing future appointments within next 90 days and meeting all other requirements  I discussed the assessment and treatment plan with the patient. The patient was provided an opportunity to ask questions and all were answered. The patient agreed with the plan and demonstrated an understanding of the instructions.  Patient advised to call back or seek an in-person evaluation if the symptoms or condition worsens.  Duration of encounter: *** minutes.  Total time on encounter, as per AMA guidelines included both the face-to-face and non-face-to-face time personally spent by the physician and/or other qualified health care professional(s) on the day of the encounter (includes time in activities that require the physician or other qualified health care professional and does not include time in activities normally performed by clinical staff). Physician's time may include the following activities when performed: Preparing to see the patient (e.g., pre-charting review of records, searching for previously ordered imaging, lab work, and nerve conduction tests) Review of prior analgesic pharmacotherapies. Reviewing PMP Interpreting ordered tests (e.g., lab work, imaging, nerve conduction tests) Performing post-procedure evaluations, including interpretation of diagnostic procedures Obtaining and/or reviewing separately obtained history Performing a medically appropriate examination and/or evaluation Counseling and educating the patient/family/caregiver Ordering medications, tests, or procedures Referring and communicating with other health care professionals (when not separately  reported) Documenting clinical information in the electronic or other health record Independently interpreting results (not separately reported) and communicating results to the patient/ family/caregiver Care coordination (not separately reported)  Note by: Candi Chafe, MD (TTS and AI technology used. I apologize for any typographical errors that were not detected and corrected.) Date: 08/24/2023; Time: 7:27 AM

## 2023-08-24 ENCOUNTER — Ambulatory Visit: Attending: Pain Medicine | Admitting: Pain Medicine

## 2023-08-24 ENCOUNTER — Encounter: Payer: Self-pay | Admitting: Pain Medicine

## 2023-08-24 VITALS — BP 108/75 | HR 110 | Temp 98.5°F | Resp 16 | Ht 74.0 in | Wt 139.0 lb

## 2023-08-24 DIAGNOSIS — M5459 Other low back pain: Secondary | ICD-10-CM | POA: Diagnosis not present

## 2023-08-24 DIAGNOSIS — G8929 Other chronic pain: Secondary | ICD-10-CM | POA: Diagnosis not present

## 2023-08-24 DIAGNOSIS — M47816 Spondylosis without myelopathy or radiculopathy, lumbar region: Secondary | ICD-10-CM | POA: Diagnosis not present

## 2023-08-24 DIAGNOSIS — Z09 Encounter for follow-up examination after completed treatment for conditions other than malignant neoplasm: Secondary | ICD-10-CM | POA: Insufficient documentation

## 2023-08-24 DIAGNOSIS — M549 Dorsalgia, unspecified: Secondary | ICD-10-CM | POA: Diagnosis present

## 2023-08-24 DIAGNOSIS — M545 Low back pain, unspecified: Secondary | ICD-10-CM | POA: Insufficient documentation

## 2023-08-24 NOTE — Progress Notes (Signed)
 Nursing Pain Medication Assessment:  Safety precautions to be maintained throughout the outpatient stay will include: orient to surroundings, keep bed in low position, maintain call bell within reach at all times, provide assistance with transfer out of bed and ambulation.  Medication Inspection Compliance: Pill count conducted under aseptic conditions, in front of the patient. Neither the pills nor the bottle was removed from the patient's sight at any time. Once count was completed pills were immediately returned to the patient in their original bottle.  Medication: Hydrocodone /APAP Pill/Patch Count: 13 of 60 pills/patches remain Pill/Patch Appearance: Markings consistent with prescribed medication Bottle Appearance: Standard pharmacy container. Clearly labeled. Filled Date: 05 / 12 / 2025 Last Medication intake:  Today

## 2023-08-24 NOTE — Patient Instructions (Signed)
 ______________________________________________________________________    TENS (Device can be purchased "online", without prescription. Search: "TENS 7000".) Transcutaneous electrical nerve stimulation (TENS) is a method of pain relief involving the use of a mild electrical current. A TENS machine is a small, battery-operated device that has leads connected to sticky pads called electrodes.   Rechargeable 9V batteries:     Electrode placement:   TENS UNIT SAFETY WARNING SHEET and INFORMATION INDICATIONS AND CONTRAINDICTIONS Read the operation manual before using the device. Freight forwarder ([place] ) restricts this device to sale by or on the order of a physician. Observe your physician's precise instructions and let him show you where to apply the electrodes. For a successful therapy, the correct application of the electrodes is an important factor. Carefully write down the settings your physician recommended. Indications for use This device is a prescription device and only for symptomatic relief of chronic intractable pain. Contraindications:   Any electrode placement that applies current to the carotid sinus (neck) region.   Patients with implanted electronic devices (for example, a pacemaker) or metallic implants should not undertake.   Any electrode placement that causes current to flow transcerebrally (through the head). The use of unit whenever pain symptoms are undiagnosed, unit etiology is determined.   The use of TENS whenever pain syndromes are undiagnosed, until etiology is established.  WARNINGS AND PRECAUTIONS  Warnings:   The device must be kept out of reach of children.   The safety of device for use during pregnancy or delivery has not been established.   Do not place electrodes on front of the throat. This may result in spasms of the laryngeal and pharyngeal muscles.   Do not place the electrodes over the carotid nerve (side of neck below ear).   The device is not effective for pain of  central origin (headaches).   The device may interfere with electronic monitoring equipment (such as ECG monitors and ECG alarms).   Electrodes should not be placed over the eyes, in the mouth, or internally.   These devices have no curative value.   TENS devices should be used only under the continued supervision of a physician.   TENS is a symptomatic treatment and as such suppresses the sensation of pain which would otherwise serve as a protective mechanism. Precautions/Adverse Reactions   Isolated cases of skin irritation may occur at the site of electrode placement following long-term application.   Stimulation should be stopped and electrodes removed until the cause of the irritation can be determined.   Effectiveness is highly dependent upon patient selection by a person qualified in the management of pain patients.   If the device treatment becomes ineffective or unpleasant, stimulation should be discontinued until reevaluation by a physician/clinician.   Always turn the device off before applying or removing electrodes.   Skin irritation and electrode burns are potential adverse reactions.  PURPOSE: A Transcutaneous Electrical Nerve Stimulator, or TENS, unit is designed to relieve post-operative, acute and chronic pain. It is used for pain caused by peripheral nerves and not central. TENS units are prescription-only devices.  OPERATION: TENS units work in a couple of ways. The first way they are thought to work is by a method called the [employer]. The [employer] states that our brains can only handle one stimulus at a time. When you have chronic pain, this pain signal is constantly being sent to your brain and recognized as pain. When an electrical stimulus is added to the area of pain the body feels  this electrical stimulus, and since the brain can only handle one thing at a time, the pain is not transmitted to the brain. The second method thought to be part of TENS unit's success is by way of  stimulating our own bodies to release their own natural painkillers. TENS units do not work for everyone and results may vary. Always follow the instructions and warnings in your user's manual.  USE: One of the most important tasks that must be performed is battery maintenance. If you are using a Engineering ge[employer], always fully charge it and fully deplete it before charging it again. These batteries can develop memories and by not performing this charging task correctly, your battery's life can be greatly diminished. If your battery does develop a memory you can help expand the memory by charging for 12 - 13 hours and then completely depleting the battery. Always prepare the skin before applying electrodes. Your skin should be clean and free of any lotions or creams. If you are using electrodes that use conductive gel, apply a small, even layer over the electrode. For carbon, self-adhesive electrodes, apply a drop of water to the electrodes before applying to the skin. The electrodes attach to the lead wires and then the TENS unit. Always grasp the connector and not the cord when inserting or removing. When making adjustments, always make sure the unit's channels (1 and 2) are in the OFF position. The actual settings should be recommended and prescribed by your physician. Medical equipment suppliers don'tset or instruct users as to user settings. When you are using the BURST mode, the unit delivers a series of quick pulses followed by a rest. This cycle repeats itself frequently. Always have channels OFF before changing modes.  For MODULATION mode, the stimulation automatically varies the width of the pulse.  For CONVENTIONAL mode, the stimulation is constant. After the settings have been fine-tuned, set the timer to 30 or 60 minutes. Your physician should also prescribe the use time. When the lights become dim, it means your batteries should be replaced or recharged.  ACCESSORIES: The  electrodes and lead wires can be obtained from your medical equipment supplier. Your medical equipment supplier can set up a recurring delivery to accommodate your needs. Electrodes should be replaced once a month and lead wires once every 6 months.  Video Tutorial https://youtu.be/V_quvXRrlQE?[email or web address]  ______________________________________________________________________

## 2023-08-26 ENCOUNTER — Other Ambulatory Visit: Payer: Self-pay

## 2023-08-30 ENCOUNTER — Ambulatory Visit: Admitting: Nurse Practitioner

## 2023-08-31 ENCOUNTER — Emergency Department

## 2023-08-31 ENCOUNTER — Other Ambulatory Visit: Payer: Self-pay | Admitting: Nurse Practitioner

## 2023-08-31 ENCOUNTER — Emergency Department
Admission: EM | Admit: 2023-08-31 | Discharge: 2023-08-31 | Disposition: A | Discharge status: Home / Self Care | Attending: Emergency Medicine | Admitting: Emergency Medicine

## 2023-08-31 ENCOUNTER — Other Ambulatory Visit: Payer: Self-pay

## 2023-08-31 DIAGNOSIS — Z5189 Encounter for other specified aftercare: Secondary | ICD-10-CM

## 2023-08-31 DIAGNOSIS — Z4801 Encounter for change or removal of surgical wound dressing: Secondary | ICD-10-CM | POA: Diagnosis present

## 2023-08-31 LAB — BASIC METABOLIC PANEL WITH GFR
Anion gap: 11 (ref 5–15)
BUN: 19 mg/dL (ref 6–20)
CO2: 22 mmol/L (ref 22–32)
Calcium: 9.3 mg/dL (ref 8.9–10.3)
Chloride: 103 mmol/L (ref 98–111)
Creatinine, Ser: 1.37 mg/dL — ABNORMAL HIGH (ref 0.61–1.24)
GFR, Estimated: 59 mL/min — ABNORMAL LOW (ref >60)
Glucose, Bld: 228 mg/dL — ABNORMAL HIGH (ref 70–99)
Potassium: 3.7 mmol/L (ref 3.5–5.1)
Sodium: 136 mmol/L (ref 135–145)

## 2023-08-31 LAB — CBC
HCT: 34.6 % — ABNORMAL LOW (ref 39.0–52.0)
Hemoglobin: 10.9 g/dL — ABNORMAL LOW (ref 13.0–17.0)
MCH: 24 pg — ABNORMAL LOW (ref 26.0–34.0)
MCHC: 31.5 g/dL (ref 30.0–36.0)
MCV: 76 fL — ABNORMAL LOW (ref 80.0–100.0)
Platelets: 260 10*3/uL (ref 150–400)
RBC: 4.55 MIL/uL (ref 4.22–5.81)
RDW: 16 % — ABNORMAL HIGH (ref 11.5–15.5)
WBC: 6.3 10*3/uL (ref 4.0–10.5)
nRBC: 0 % (ref 0.0–0.2)

## 2023-08-31 MED ORDER — SULFAMETHOXAZOLE-TRIMETHOPRIM 800-160 MG PO TABS: 1.0000 | ORAL_TABLET | Freq: Two times a day (BID) | ORAL | 0 refills | Status: AC

## 2023-08-31 MED ORDER — SULFAMETHOXAZOLE-TRIMETHOPRIM 800-160 MG PO TABS
1.0000 | ORAL_TABLET | Freq: Once | ORAL | Status: AC
  Administered 2023-08-31: 1 via ORAL
  Filled 2023-08-31: qty 1

## 2023-08-31 NOTE — ED Provider Notes (Signed)
 [facility] Provider Note    Event Date/Time   First MD Initiated Contact with Patient 08/31/23 2146     (approximate)   History   Wound Check and Post-op Problem   HPI  Darren Allen is a 58 y.o. male who presents to the emergency department today because of concerns from drainage from an abdominal incision site.  The patient had admission the last month for abdominal wall abscess.  Had it drained by surgery.  He states he has been following up and had been doing well until a couple of days ago when he started noticing some drainage again.  He has minimal discomfort to that area although denies any generalized abdominal discomfort.  Denies any fevers.  No nausea or vomiting.     Physical Exam   Triage Vital Signs: ED Triage Vitals [08/31/23 2107]  Encounter Vitals Group     BP (!) 161/92     Girls Systolic BP Percentile      Girls Diastolic BP Percentile      Boys Systolic BP Percentile      Boys Diastolic BP Percentile      Pulse Rate (!) 108     Resp 18     Temp 98.4 F (36.9 C)     Temp Source Oral     SpO2 99 %     Weight 235 lb (106.6 kg)     Height 6' 2 (1.88 m)     Head Circumference      Peak Flow      Pain Score 0     Pain Loc      Pain Education      Exclude from Growth Chart     Most recent vital signs: Vitals:   08/31/23 2107  BP: (!) 161/92  Pulse: (!) 108  Resp: 18  Temp: 98.4 F (36.9 C)  SpO2: 99%   General: Awake, alert, oriented. CV:  Good peripheral perfusion. Resp:  Normal effort.  Abd:  Small dehiscence of abdominal wound < 0.5 cm. Small amount of purulent drainage able to be expressed. No surrounding erythema. No tenderness.     ED Results / Procedures / Treatments   Labs (all labs ordered are listed, but only abnormal results are displayed) Labs Reviewed  CBC - Abnormal; Notable for the following components:      Result Value   Hemoglobin 10.9 (*)    HCT 34.6 (*)    MCV 76.0 (*)    MCH 24.0  (*)    RDW 16.0 (*)    All other components within normal limits  BASIC METABOLIC PANEL WITH GFR - Abnormal; Notable for the following components:   Glucose, Bld 228 (*)    Creatinine, Ser 1.37 (*)    GFR, Estimated 59 (*)    All other components within normal limits  AEROBIC/ANAEROBIC CULTURE W GRAM STAIN (SURGICAL/DEEP WOUND)     EKG  None   RADIOLOGY I independently interpreted and visualized the US  soft tissue. My interpretation: No large fluid collection Radiology interpretation:  IMPRESSION:  Edema and emphysema along the subcutaneus soft tissues of the mid  abdomen. No definite organized fluid collection. Consider further  evaluation with CT abdomen pelvis with intravenous contrast.     PROCEDURES:  Critical Care performed: No   MEDICATIONS ORDERED IN ED: Medications - No data to display   IMPRESSION / MDM / ASSESSMENT AND PLAN / ED COURSE  I reviewed the triage vital signs and the  nursing notes.                              Differential diagnosis includes, but is not limited to, abscess, cellulitis  Patient's presentation is most consistent with acute presentation with potential threat to life or bodily function.   Patient presented to the emergency department today because of concerns for drainage from recent incision site.  On exam he has a very small opening less than half a centimeter.  There is some purulent drainage that can be expressed from that wound.  However no surrounding erythema.  No abdominal tenderness.  I did obtain an ultrasound which did not show any large collection of fluid.  At this time I do not feel CT scan is necessary given the lack of abdominal tenderness, fever or leukocytosis.  I have low concern for deep infection.  Will start patient on course of antibiotics.  Encourage patient to follow-up with surgery.     FINAL CLINICAL IMPRESSION(S) / ED DIAGNOSES   Final diagnoses:  Visit for wound check     Note:  This document was  prepared using Dragon voice recognition software and may include unintentional dictation errors.    Marylynn Soho, MD 08/31/23 [id number]

## 2023-08-31 NOTE — ED Triage Notes (Signed)
 Pt to ED via pov c/o drainage to incision site. Pt had abdominal abscesses that were drained about 5 weeks ago. Pt had appt about 2 weeks ago to look at incision site and it looked good. Pt reports noticing drainage from incision site 2 days ago and has been getting worse. Denies any known fevers.

## 2023-09-01 ENCOUNTER — Ambulatory Visit (INDEPENDENT_AMBULATORY_CARE_PROVIDER_SITE_OTHER): Admitting: Physician Assistant

## 2023-09-01 ENCOUNTER — Encounter: Payer: Self-pay | Admitting: Physician Assistant

## 2023-09-01 VITALS — BP 132/77 | HR 105 | Temp 98.4°F | Ht 74.0 in | Wt 242.4 lb

## 2023-09-01 DIAGNOSIS — L02211 Cutaneous abscess of abdominal wall: Secondary | ICD-10-CM

## 2023-09-01 DIAGNOSIS — Z09 Encounter for follow-up examination after completed treatment for conditions other than malignant neoplasm: Secondary | ICD-10-CM

## 2023-09-01 NOTE — Patient Instructions (Signed)
 We have scheduled you for a CT Scan of your Abdomen and Pelvis with contrast. This has been scheduled at Outpatient imaging on [address] on 09/02/2023 . Please arrive there by 11 am . If you need to reschedule your Scan, you may do so by calling [phone]. Please let us  know if you reschedule your scan as we have to get authorization from your insurance for this.

## 2023-09-01 NOTE — Progress Notes (Signed)
 [facility] POST-OP OFFICE VISIT  09/01/2023  HPI: Darren Allen is a 58 y.o. male s/p incision and drainage of abdominal wall abscess 05/14  He had initially done well afterwards and wound closed without issue. He reports that he was coming home from vacation yesterday and when he got home he noticed significant increase and return of drainage from his umbilicus at spot of previous abscess. These was seropurulent in nature. He denied any injuries. No significant pain. No fever, chills. He was evaluated in the ED (06/17) and notes reviewed. Labs reviewed and are reassuring. Cx was taken and will follow this. Started on Bactrim . Of not, he is s/p ventral hernia repair on 01/23 with Ventralex mesh  Vital signs: BP 132/77   Pulse (!) 105   Temp 98.4 F (36.9 C) (Oral)   Ht 6' 2 (1.88 m)   Wt 242 lb 6.4 oz (110 kg)   SpO2 96%   BMI 31.12 kg/m    Physical Exam: Constitutional: Well appearing male, NAD Abdomen: Soft, non-tender, non-distended, no rebound/guarding Skin: At his previous midline umbilical scar/site of previous I&D, there is a small pinpoint hole which is draining small amount of seropurulent drainage, the skin surrounding is mildly erythematous.   Assessment/Plan: This is a 58 y.o. male s/p incision and drainage of abdominal wall abscess 05/14   - I do think we should obtain CT Abdomen/Pelvis to ensure there is no missed intra-abdominal process or undrained abscess, especially in the setting of hernia repair with mesh.   - Appreciate ED evaluation; agree with Abx - Follow up most recent Cx, previous Cx with E coli, Klebsiella  - I will call him with results of CT when available and discuss what to do next.   - Reviewed signs and symptoms of worsening infection which should warrant presentation to the ED.   -- Darren Bay, PA-C [facility] 09/01/2023, 4:01 PM M-F: 7am - 4pm

## 2023-09-02 ENCOUNTER — Ambulatory Visit
Admission: RE | Admit: 2023-09-02 | Discharge: 2023-09-02 | Disposition: A | Discharge status: Home / Self Care | Source: Ambulatory Visit | Attending: Physician Assistant | Admitting: Physician Assistant

## 2023-09-02 DIAGNOSIS — L02211 Cutaneous abscess of abdominal wall: Secondary | ICD-10-CM | POA: Diagnosis present

## 2023-09-02 MED ORDER — IOHEXOL 9 MG/ML PO SOLN
500.0000 mL | ORAL | Status: AC
  Administered 2023-09-02 (×2): 500 mL via ORAL

## 2023-09-02 MED ORDER — IOHEXOL 300 MG/ML  SOLN
100.0000 mL | Freq: Once | INTRAMUSCULAR | Status: AC | PRN
Start: 2023-09-02 — End: 2023-09-02
  Administered 2023-09-02: 100 mL via INTRAVENOUS

## 2023-09-02 NOTE — Telephone Encounter (Signed)
 Requested Prescriptions  Pending Prescriptions Disp Refills   lisinopril  (ZESTRIL ) 10 MG tablet [Pharmacy Med Name: LISINOPRIL  10 MG TABLET] 90 tablet 0    Sig: Take 1 tablet (10 mg total) by mouth daily.     Cardiovascular:  ACE Inhibitors Failed - 09/02/2023  2:00 PM      Failed - Cr in normal range and within 180 days    Creatinine  Date Value Ref Range Status  03/13/2014 1.08 0.60 - 1.30 mg/dL Final   Creatinine, Ser  Date Value Ref Range Status  08/31/2023 1.37 (H) 0.61 - 1.24 mg/dL Final         Passed - K in normal range and within 180 days    Potassium  Date Value Ref Range Status  08/31/2023 3.7 3.5 - 5.1 mmol/L Final  03/13/2014 4.4 3.5 - 5.1 mmol/L Final         Passed - Patient is not pregnant      Passed - Last BP in normal range    BP Readings from Last 1 Encounters:  09/01/23 132/77         Passed - Valid encounter within last 6 months    Recent Outpatient Visits           3 months ago Chronic obstructive pulmonary disease, unspecified COPD type (HCC)   [facility] Aileen Alexanders, NP   4 months ago Recurrent boils   [place] [facility] Aileen Alexanders, NP       Future Appointments             In 1 month Harris Liming, MD [facility]

## 2023-09-03 ENCOUNTER — Encounter: Payer: Self-pay | Admitting: Physician Assistant

## 2023-09-03 ENCOUNTER — Ambulatory Visit: Admitting: Physician Assistant

## 2023-09-03 VITALS — BP 143/77 | HR 91 | Temp 98.6°F | Ht 74.0 in | Wt 242.4 lb

## 2023-09-03 DIAGNOSIS — L02211 Cutaneous abscess of abdominal wall: Secondary | ICD-10-CM | POA: Diagnosis not present

## 2023-09-03 MED ORDER — METRONIDAZOLE 500 MG PO TABS: 500.0000 mg | ORAL_TABLET | Freq: Three times a day (TID) | ORAL | 0 refills | Status: AC

## 2023-09-03 NOTE — Patient Instructions (Signed)
 Today we have drained your Abscess in the office. The numbing medication will wear off in approximately 4-8 hours. You will have some pain to the area afterwards but should not be as severe as prior to the procedure.  If you have been given antibiotics, please continue to take them after your procedure.  You may take 2 extra strength Tylenol, or 3 regular Ibuprofen tablets every 6 hours as needed for pain and discomfort.  You may shower. First remove all of the packing and wash letting the warm soapy water run over the area, rinse well, and pat dry.   Wound Packing  Wound packing usually involves placing a moistened packing material into your wound and then covering it with an outer bandage (dressing). This helps support the healing of deep tissue and tissue under the skin. It also helps prevent bleeding, infection, and further injury. Wounds are packed until deep tissues heal. The time it takes for this to happen is different for everyone. Your health care provider will show you how to pack and dress your wound. Using gloves and a clean technique is important to avoid spreading germs into your wound. Supplies needed: Soap and water. Disposable gloves. Cleansing or wetting solution, such as saline, germ-free (sterile) water, or an antiseptic solution. Clean bowl. Clean packing material, such as gauze, gauze sponges, or rolled gauze. Clean paper towels. Outer dressing. This includes the cover dressing and tape, or a dressing with an adhesive border. Cotton-tipped swabs. Small plastic bag for trash. How to pack your wound Follow your health care provider's instructions on how often you need to change dressings and pack your wound. You will likely be asked to change your dressings 1 to 2 times a day. Preparing to change the wound packing If needed, take pain medicine 30 minutes before you pack your wound as told by your health care provider. Preparing the new packing material  Clean and  disinfect your work surface or countertop. Set a plastic bag on or near your work surface. Wash your hands with soap and water for at least 20 seconds before you change the dressing. If soap and water are not available, use hand sanitizer. Put a clean paper towel on the counter. Put a clean bowl on the towel. Only touch the outside of the bowl when handling it. Pour the cleansing or wetting solution that your health care provider tells you to use into the bowl. Select and cut your packing material to fit the size of your wound. Avoid using multiple pieces of packing material. Drop it into the bowl. Cut tape strips that you will use to seal the outer dressing, if needed. Put gauze pads for cleansing and cotton-tipped swabs on the clean paper towel. Removing the old packing material and dressing Put on a set of gloves. Gently remove the old dressing and packing material. Make sure to check how the drainage looks or if there is any odor. Clean or rinse (irrigate) the wound. Remove your gloves. Put the removed items, including gloves, into the plastic bag to throw away later. Wash your hands again with soap and water for at least 20 seconds. If soap and water are not available, use hand sanitizer. Applying the new packing material and dressing  Put on a new set of gloves. Squeeze the packing material in the bowl to release the extra liquid. The packing material should be moist, but not dripping wet. Gently place the packing material into the wound. Use a cotton-tipped swab to guide  it into place, filling all of the space. Do not overpack the wound bed. Dry your gloved fingertips on the paper towel. Open up your outer dressing supplies and put them on a dry part of the paper towel. Keep them from getting wet. Place the outer dressing over the packed wound. Tape the edges of the outer dressing in place. Remove your gloves. Wash your hands again with soap and water for at least 20 seconds. If soap  and water are not available, use hand sanitizer. Put the removed items, including gloves, into the plastic bag to throw away. Clean and disinfect your work surface or countertop. General tips Follow your health care provider's instructions on how much to pack the wound. At first, you may need to pack it more fully to help stop bleeding. As the wound begins to heal inside, you will use less packing material and pack the wound loosely to allow the tissue to heal slowly from the inside out. Do not take baths, swim, or use a hot tub until your health care provider approves. Ask your health care provider if you may take showers. You may only be allowed to take sponge baths. Keep the dressing clean and dry. Follow any other instructions given by your health care provider on how to aid healing. This may include applying warm or cold compresses, raising (elevating) the affected area, or wearing a compression dressing. Check your wound site every day for signs of infection. Check for: More redness, swelling, or pain. More fluid or blood. Warmth or hardness (induration). Pus or a bad smell. Protect your wound from the sun when you are outside for the first 6 months, or for as long as told by your health care provider. Cover up the scar area or apply sunscreen that has an SPF of at least 30. Keep all follow-up visits. This is important. Contact a health care provider if: Your pain is not controlled with pain medicine. You have more drainage, redness, swelling, or pain at your wound site. You have new rash, warmth, or induration around the wound. You have a fever or chills. Your wound becomes larger or deeper. Get help right away if: The tissue inside your wound changes color from pink to white, yellow, or black. You notice a bad smell or pus coming from the wound site. You are having trouble packing your wound. Your wound is bleeding, and the bleeding does not stop with gentle pressure. These symptoms  may represent a serious problem that is an emergency. Do not wait to see if the symptoms will go away. Get medical help right away. Call your local emergency services (911 in the [place] Do not drive yourself to the hospital. Summary Wound packing usually involves placing a moistened packing material into your wound and then covering it with an outer bandage (dressing). Follow your health care provider's instructions on how often you need to change dressings and pack your wound. You will likely be asked to change dressings 1 to 2 times a day. When packing your wound, it is important to use gloves to avoid spreading germs into the wound. Check your wound site every day for signs of infection. This information is not intended to replace advice given to you by your health care provider. Make sure you discuss any questions you have with your health care provider. Document Revised: 07/09/2020 Document Reviewed: 07/09/2020 Elsevier Patient Education  2024 Elsevier Inc.  We will see you back as scheduled below.   If you have  any questions or concerns prior to your appointment, please call our office and speak with a nurse.  Incision and Drainage Incision and drainage is a surgical procedure to open and drain a fluid-filled sac. The sac may be filled with pus, mucus, or blood. Examples of fluid-filled sacs that may need surgical drainage include cysts, skin infections (abscesses), and red lumps that develop from a ruptured cyst or a small abscess (boils). You may need this procedure if the affected area is large, painful, infected, or not healing well. Tell a health care provider about: Any allergies you have. All medicines you are taking, including vitamins, herbs, eye drops, creams, and over-the-counter medicines. Any problems you or family members have had with anesthetic medicines. Any blood disorders you have. Any surgeries you have had. Any medical conditions you have. Whether you are pregnant  or may be pregnant. What are the risks? Generally, this is a safe procedure. However, problems may occur, including: Infection. Bleeding. Allergic reactions to medicines. Scarring.  What happens before the procedure? You may need an ultrasound or other imaging tests to see how large or deep the fluid-filled sac is. You may have blood tests to check for infection. You may get a tetanus shot. You may be given antibiotic medicine to help prevent infection. Follow instructions from your health care provider about eating or drinking restrictions. Ask your health care provider about: Changing or stopping your regular medicines. This is especially important if you are taking diabetes medicines or blood thinners. Taking medicines such as aspirin and ibuprofen. These medicines can thin your blood. Do not take these medicines before your procedure if your health care provider instructs you not to. Plan to have someone take you home after the procedure. If you will be going home right after the procedure, plan to have someone stay with you for 24 hours. What happens during the procedure? To reduce your risk of infection: Your health care team will wash or sanitize their hands. Your skin will be washed with soap. You will be given one or more of the following: A medicine to help you relax (sedative). A medicine to numb the area (local anesthetic). A medicine to make you fall asleep (general anesthetic). An incision will be made in the top of the fluid-filled sac. The contents of the sac may be squeezed out, or a syringe or tube (catheter) may be used to empty the sac. The catheter may be left in place for several weeks to drain any fluid. Or, your health care provider may stitch open the edges of the incision to make a long-term opening for drainage (marsupialization). The inside of the sac may be washed out (irrigated) with a sterile solution and packed with gauze before it is covered with a  bandage (dressing). The procedure may vary among health care providers and hospitals. What happens after the procedure? Your blood pressure, heart rate, breathing rate, and blood oxygen level will be monitored often until the medicines you were given have worn off. Do not drive for 24 hours if you received a sedative. This information is not intended to replace advice given to you by your health care provider. Make sure you discuss any questions you have with your health care provider. Document Released: 08/26/2000 Document Revised: 08/08/2015 Document Reviewed: 12/21/2014 Elsevier Interactive Patient Education  2017 Elsevier Inc.   Incision and Drainage, Care After Refer to this sheet in the next few weeks. These instructions provide you with information about caring for yourself after your  procedure. Your health care provider may also give you more specific instructions. Your treatment has been planned according to current medical practices, but problems sometimes occur. Call your health care provider if you have any problems or questions after your procedure. What can I expect after the procedure? After the procedure, it is common to have: Pain or discomfort around your incision site. Drainage from your incision.  Follow these instructions at home: Take over-the-counter and prescription medicines only as told by your health care provider. If you were prescribed an antibiotic medicine, take it as told by your health care provider. Do not stop taking the antibiotic even if you start to feel better. Follow instructions from your health care provider about: How to take care of your incision. When and how you should change your packing and bandage (dressing). Wash your hands with soap and water before you change your dressing. If soap and water are not available, use hand sanitizer. When you should remove your dressing. Do not take baths, swim, or use a hot tub until your health care provider  approves. Keep all follow-up visits as told by your health care provider. This is important. Check your incision area every day for signs of infection. Check for: More redness, swelling, or pain. More fluid or blood. Warmth. Pus or a bad smell. Contact a health care provider if: Your cyst or abscess returns. You have a fever. You have more redness, swelling, or pain around your incision. You have more fluid or blood coming from your incision. Your incision feels warm to the touch. You have pus or a bad smell coming from your incision. Get help right away if: You have severe pain or bleeding. You cannot eat or drink without vomiting. You have decreased urine output. You become short of breath. You have chest pain. You cough up blood. The area where the incision and drainage occurred becomes numb or it tingles. This information is not intended to replace advice given to you by your health care provider. Make sure you discuss any questions you have with your health care provider. Document Released: 05/25/2011 Document Revised: 08/02/2015 Document Reviewed: 12/21/2014 Elsevier Interactive Patient Education  2017 [employer].

## 2023-09-03 NOTE — Progress Notes (Signed)
 Procedure Note  Date: 09/03/23 10:26 AM  Preforming Provider: [name], PA-C  Pre-Procedure Diagnosis: Recurrent Abdominal Wall Abscess   Post-Procedure Diagnosis: Same   Anesthesia: 8 ccs of 1% lidocaine  with epinephrine   Findings: Serosanguinous drainage  Indications: Darren Allen is a 58 y.o. male with history of ventral hernia repair with Ventralex mesh in January 16109. He presented to the ED in May 2025 with abdominal wall abscess. This was drained and he recovered well. Unfortunately, in the las few days, this has recurred. He had CGT Abdomen/Pelvis on 06/19 which was concerning for residual air/fluid at abdominal wall. He presents today with repeat I&D.   Details of Procedure:  All risks, benefits, and alternatives to above procedure(s) were discussed with the patient and informed consent was obtained. Abdomen was prepped and draped in standard sterile fashion. 8 ccs of 1% lidocaine  with epinephrine  with injected intradermally and adequate anesthesia achieved. Using an 11 blade scalpel, incision was made over area of active drainage and previous scar. This revealed serosanguinous drainage. There was visible suture deep in this wound and did not extend below the fascia. This was probed with q-tip and forceps and did not reveal any areas of undrained abscess. The wound was then irrigated with copious amount of NS and packed with 1/2 inch packing. The patient tolerated this well without immediate complications.   Complications: None apparent  Wound Measures: 3 x 2 x 2 cm, subcutaneous fat and scar, no excision of muscle nor fascia required.   Plan:  -- Wound care: Pack daily with packing strips, cover, and secure. Change daily and as needed. Oka to remove to shower, no submerging.  -- Complete Bactrim , will add Flagyl  as previous Cx with E coli - follow up Cx from 06/174 -- I am worried that there may be potential for mesh compromise/infection based on recurrence of this and  appearance on CT. Very strange to have occurred 4 months after procedure. Question if there may have small fistulous connection between underlying colon and mesh. We will see. If this recurs again, I do think he will require formal mesh excision -- I will follow up with him on 06/25  -- [name], PA-C [facility] 09/03/2023, 10:32 AM M-F: 7am - 4pm  --  [name], PA-C  [facility] 09/03/2023, 10:26 AM M-F: 7am - 4pm

## 2023-09-06 ENCOUNTER — Telehealth: Payer: Self-pay | Admitting: Physician Assistant

## 2023-09-06 ENCOUNTER — Other Ambulatory Visit

## 2023-09-06 ENCOUNTER — Ambulatory Visit: Admitting: Nurse Practitioner

## 2023-09-06 LAB — AEROBIC/ANAEROBIC CULTURE W GRAM STAIN (SURGICAL/DEEP WOUND)

## 2023-09-06 NOTE — Telephone Encounter (Signed)
 Patient calls, saw his culture results in his MyChart and worried that it is something serious.  Please call patient.

## 2023-09-07 ENCOUNTER — Encounter: Payer: Self-pay | Admitting: Physician Assistant

## 2023-09-07 ENCOUNTER — Ambulatory Visit (INDEPENDENT_AMBULATORY_CARE_PROVIDER_SITE_OTHER): Admitting: Physician Assistant

## 2023-09-07 VITALS — BP 131/72 | HR 92 | Temp 98.5°F | Ht 74.0 in | Wt 242.0 lb

## 2023-09-07 DIAGNOSIS — L02211 Cutaneous abscess of abdominal wall: Secondary | ICD-10-CM

## 2023-09-07 DIAGNOSIS — Z09 Encounter for follow-up examination after completed treatment for conditions other than malignant neoplasm: Secondary | ICD-10-CM

## 2023-09-07 NOTE — Progress Notes (Signed)
 [facility] POST-OP OFFICE VISIT  09/07/2023  HPI: Darren Allen is a 58 y.o. male 4 days s/p I&D and drainage of recurrent abdominal wall abscess s/p ventral hernia repair on 04/08/2023  I did speak to the patient this morning on the phone given concerns over his Cx results from ED visit on 06/17. These were similar to his initial Cx from first I&D and were susceptible to his current Abx  He presents this afternoon for in office follow up. He reports that he has not felt overly well the last few days. Still with some abdominal pain at I&D site which radiates down his abdomen. No fever, chills, emesis. Some nausea but may be secondary to Abx. He continues to pack wound. Drainage serous in nature. No surrounding erythema. Otherwise no new complaints.   Vital signs: BP 131/72   Pulse 92   Temp 98.5 F (36.9 C)   Ht 6' 2 (1.88 m)   Wt 242 lb (109.8 kg)   SpO2 98%   BMI 31.07 kg/m    Physical Exam: Constitutional: Well appearing male, NAD Abdomen: Soft, non-tender, non-distended, no rebound/guarding Skin: 2 x 2 x 1 cm wound to previous ventral hernia site and site of previous I&D. Wound bed is starting to granulation. There is some fibrinous drainage present. I took extensive time with palpation of surrounding tissue without any evidence of further draining collection. I do not appreciate any undrained abscess. No evidence of necrotizing infection.   Assessment/Plan: This is a 58 y.o. male 4 days s/p I&D and drainage of recurrent abdominal wall abscess s/p ventral hernia repair on 04/08/2023   - I do worry that given the recurrence of this abdominal wall abscess and appearance of air/fluid collection on his CT from 06/19 that his mesh may become compromised. This is very adjacent to the colon and question if this was seeded with bacteria given his multiple Cx results with gut bacteria. For now, I think we can continue to make efforts at salvaging this but he understands  we have a low threshold to proceed with mesh excision if he fails to improve, recurs, or worsens at any time. I will attempt to arrange close follow up with Dr Jordis.   - He will complete current course of Abx as prescribed  - Pain control prn  - Reviewed wound care recommendation; pack daily, cover, secure. Okay to remove to shower.   -- Arthea Platt, PA-C [facility] 09/07/2023, 2:16 PM M-F: 7am - 4pm

## 2023-09-07 NOTE — Patient Instructions (Signed)
 Continue dry gauze dressing daily.

## 2023-09-08 ENCOUNTER — Encounter: Admitting: Physician Assistant

## 2023-09-09 ENCOUNTER — Encounter: Payer: Self-pay | Admitting: Nurse Practitioner

## 2023-09-09 ENCOUNTER — Ambulatory Visit (INDEPENDENT_AMBULATORY_CARE_PROVIDER_SITE_OTHER): Admitting: Nurse Practitioner

## 2023-09-09 VITALS — BP 116/70 | HR 91 | Temp 98.1°F | Resp 16 | Ht 74.02 in | Wt 244.6 lb

## 2023-09-09 DIAGNOSIS — Z23 Encounter for immunization: Secondary | ICD-10-CM

## 2023-09-09 DIAGNOSIS — D509 Iron deficiency anemia, unspecified: Secondary | ICD-10-CM | POA: Diagnosis not present

## 2023-09-09 DIAGNOSIS — Z794 Long term (current) use of insulin: Secondary | ICD-10-CM

## 2023-09-09 DIAGNOSIS — E1159 Type 2 diabetes mellitus with other circulatory complications: Secondary | ICD-10-CM

## 2023-09-09 DIAGNOSIS — E1140 Type 2 diabetes mellitus with diabetic neuropathy, unspecified: Secondary | ICD-10-CM | POA: Diagnosis not present

## 2023-09-09 DIAGNOSIS — E1169 Type 2 diabetes mellitus with other specified complication: Secondary | ICD-10-CM

## 2023-09-09 DIAGNOSIS — I152 Hypertension secondary to endocrine disorders: Secondary | ICD-10-CM

## 2023-09-09 DIAGNOSIS — I739 Peripheral vascular disease, unspecified: Secondary | ICD-10-CM | POA: Diagnosis not present

## 2023-09-09 DIAGNOSIS — E785 Hyperlipidemia, unspecified: Secondary | ICD-10-CM

## 2023-09-09 DIAGNOSIS — L02211 Cutaneous abscess of abdominal wall: Secondary | ICD-10-CM

## 2023-09-09 NOTE — Assessment & Plan Note (Signed)
 Chronic.  Well controlled.  Continue Lisinopril  10mg  and hydrochlorothiazide  25mg .  Labs are up to date.  Will check labs again at next visit.  Followed by Cardiology.  Had a Cath in the spring.  Blood pressures are well controlled at home.  Follow up in 6 months.  Call sooner if concerns arise.

## 2023-09-09 NOTE — Assessment & Plan Note (Signed)
 Followed by Surgery.  Decreased drainage over the last two days.  Follows up with Dr. Jordis on Wednesday.

## 2023-09-09 NOTE — Assessment & Plan Note (Signed)
 Chronic.  Controlled.  Continue with current medication regimen of Crestor  40mg  and Vescepa.  Labs ordered today.  Return to clinic in 6 months for reevaluation.  Call sooner if concerns arise.

## 2023-09-09 NOTE — Assessment & Plan Note (Signed)
 Chronic.  Controlled.  Continue with current medication regimen.  Labs ordered today.  Return to clinic in 6 months for reevaluation.  Call sooner if concerns arise.  ? ?

## 2023-09-09 NOTE — Patient Instructions (Addendum)
Be Involved in Caring For Your Health:  Taking Medications When medications are taken as directed, they can greatly improve your health. But if they are not taken as prescribed, they may not work. In some cases, not taking them correctly can be harmful. To help ensure your treatment remains effective and safe, understand your medications and how to take them. Bring your medications to each visit for review by your provider.  Your lab results, notes, and after visit summary will be available on My Chart. We strongly encourage you to use this feature. If lab results are abnormal the clinic will contact you with the appropriate steps. If the clinic does not contact you assume the results are satisfactory. You can always view your results on My Chart. If you have questions regarding your health or results, please contact the clinic during office hours. You can also ask questions on My Chart.  We at [facility] are grateful that you chose [place] to provide your care. We strive to provide evidence-based and compassionate care and are always looking for feedback. If you get a survey from the clinic please complete this so we can hear your opinions.

## 2023-09-09 NOTE — Progress Notes (Signed)
 BP 116/70 (BP Location: Left Arm, Patient Position: Sitting, Cuff Size: Large)   Pulse 91   Temp 98.1 F (36.7 C) (Oral)   Resp 16   Ht 6' 2.02 (1.88 m)   Wt 244 lb 9.6 oz (110.9 kg)   SpO2 99%   BMI 31.39 kg/m    Subjective:    Patient ID: Darren Allen Newer, male    DOB: 1965/05/25, 58 y.o.   MRN: [id number]  HPI: Darren Allen is a 58 y.o. male  Chief Complaint  Patient presents with   Diabetes    Home checks range around 175 with his Dexcom.    Hypertension   HYPERTENSION / HYPERLIPIDEMIA Satisfied with current treatment? no Duration of hypertension: years BP monitoring frequency: daily BP range: 120/70 BP medication side effects: no Past BP meds:  Duration of hyperlipidemia: years Cholesterol medication side effects: no Cholesterol supplements: none Past cholesterol medications: rosuvastatin  (crestor ) Medication compliance: excellent compliance Aspirin : no Recent stressors: no Recurrent headaches: no Visual changes: no Palpitations: no Dyspnea: yes Chest pain: no Lower extremity edema: no Dizzy/lightheaded: no  DIABETES Followed by Dr. Damian.  A1c was 7.6% in February.  He is on Mounjaro 10mg  weekly.  Hypoglycemic episodes:no Polydipsia/polyuria: no Visual disturbance: no Chest pain: no Paresthesias: no Glucose Monitoring: yes  Accucheck frequency: continuous  Fasting glucose: 200-210  Post prandial:   Evening:  Before meals: Taking Insulin ?: yes  Long acting insulin : 82u am and 42 u in the evening  Short acting insulin :  Blood Pressure Monitoring: daily  Retinal Examination: up to date Foot Exam: Up to Date Diabetic Education: Not Completed Pneumovax: Up to Date Influenza: Not up to Date Aspirin : no  COPD Breathing has been good.  He feels like the Breztri  really helps him. Quit smoking in September 2024.  COPD status:controlled Satisfied with current treatment?: no Oxygen  use: no Dyspnea frequency: yes Cough frequency: yes Rescue  inhaler frequency: 1-2/x per day Limitation of activity: yes Productive cough: yes Last Spirometry:  Pneumovax: Up to Date Influenza: Not up to Date   Patient is currently being treated for abdominal wall abscess.  The drainage has decreased in half over the last 2 days.  He is finishing up his antibiotics today.  He goes back to see Surgery on Wednesday.    Relevant past medical, surgical, family and social history reviewed and updated as indicated. Interim medical history since our last visit reviewed. Allergies and medications reviewed and updated.  Review of Systems  Eyes:  Negative for visual disturbance.  Respiratory:  Positive for shortness of breath. Negative for chest tightness.   Cardiovascular:  Negative for chest pain, palpitations and leg swelling.  Endocrine: Negative for polydipsia and polyuria.  Skin:        Abdominal wall abscess   Neurological:  Negative for dizziness, light-headedness, numbness and headaches.    Per HPI unless specifically indicated above     Objective:    BP 116/70 (BP Location: Left Arm, Patient Position: Sitting, Cuff Size: Large)   Pulse 91   Temp 98.1 F (36.7 C) (Oral)   Resp 16   Ht 6' 2.02 (1.88 m)   Wt 244 lb 9.6 oz (110.9 kg)   SpO2 99%   BMI 31.39 kg/m   Wt Readings from Last 3 Encounters:  09/09/23 244 lb 9.6 oz (110.9 kg)  09/07/23 242 lb (109.8 kg)  09/03/23 242 lb 6.4 oz (110 kg)    Physical Exam Vitals and nursing note reviewed.  Constitutional:  General: He is not in acute distress.    Appearance: Normal appearance. He is not ill-appearing, toxic-appearing or diaphoretic.  HENT:     Head: Normocephalic.     Right Ear: External ear normal.     Left Ear: External ear normal.     Nose: Nose normal. No congestion or rhinorrhea.     Mouth/Throat:     Mouth: Mucous membranes are moist.   Eyes:     General:        Right eye: No discharge.        Left eye: No discharge.     Extraocular Movements: Extraocular  movements intact.     Conjunctiva/sclera: Conjunctivae normal.     Pupils: Pupils are equal, round, and reactive to light.    Cardiovascular:     Rate and Rhythm: Normal rate and regular rhythm.     Heart sounds: No murmur heard. Pulmonary:     Effort: Pulmonary effort is normal. No respiratory distress.     Breath sounds: Normal breath sounds. No wheezing, rhonchi or rales.  Abdominal:     General: Abdomen is flat. Bowel sounds are normal.   Musculoskeletal:     Cervical back: Normal range of motion and neck supple.   Skin:    General: Skin is warm and dry.     Capillary Refill: Capillary refill takes less than 2 seconds.   Neurological:     General: No focal deficit present.     Mental Status: He is alert and oriented to person, place, and time.   Psychiatric:        Mood and Affect: Mood normal.        Behavior: Behavior normal.        Thought Content: Thought content normal.        Judgment: Judgment normal.     Results for orders placed or performed during the hospital encounter of 08/31/23  CBC   Collection Time: 08/31/23  9:06 PM  Result Value Ref Range   WBC 6.3 4.0 - 10.5 K/uL   RBC 4.55 4.22 - 5.81 MIL/uL   Hemoglobin 10.9 (L) 13.0 - 17.0 g/dL   HCT 65.3 (L) 60.9 - 47.9 %   MCV 76.0 (L) 80.0 - 100.0 fL   MCH 24.0 (L) 26.0 - 34.0 pg   MCHC 31.5 30.0 - 36.0 g/dL   RDW 83.9 (H) 88.4 - 84.4 %   Platelets 260 150 - 400 K/uL   nRBC 0.0 0.0 - 0.2 %  Basic metabolic panel   Collection Time: 08/31/23  9:06 PM  Result Value Ref Range   Sodium 136 135 - 145 mmol/L   Potassium 3.7 3.5 - 5.1 mmol/L   Chloride 103 98 - 111 mmol/L   CO2 22 22 - 32 mmol/L   Glucose, Bld 228 (H) 70 - 99 mg/dL   BUN 19 6 - 20 mg/dL   Creatinine, Ser 8.62 (H) 0.61 - 1.24 mg/dL   Calcium  9.3 8.9 - 10.3 mg/dL   GFR, Estimated 59 (L) >60 mL/min   Anion gap 11 5 - 15  Aerobic/Anaerobic Culture w Gram Stain (surgical/deep wound)   Collection Time: 08/31/23 10:29 PM   Specimen: Abdomen;  Abscess  Result Value Ref Range   Specimen Description      ABDOMEN WD Performed at [facility], [address] [place], [place] [address]    Special Requests      NONE Performed at [facility], [address] [place],  [place] [address]    Gram Stain      FEW WBC PRESENT, PREDOMINANTLY PMN RARE GRAM POSITIVE COCCI Performed at [facility], [address], [place], [place] [address]    Culture      FEW KLEBSIELLA PNEUMONIAE Two isolates with different morphologies were identified as the same organism.The most resistant organism was reported. NO ANAEROBES ISOLATED RARE ENTEROBACTER SAKAZAKII    Report Status 09/06/2023 FINAL    Organism ID, Bacteria KLEBSIELLA PNEUMONIAE    Organism ID, Bacteria ENTEROBACTER SAKAZAKII       Susceptibility   Enterobacter sakazakii - MIC*    CEFEPIME <=0.12 SENSITIVE Sensitive     CEFTAZIDIME <=1 SENSITIVE Sensitive     CEFTRIAXONE  <=0.25 SENSITIVE Sensitive     CIPROFLOXACIN  <=0.25 SENSITIVE Sensitive     GENTAMICIN <=1 SENSITIVE Sensitive     IMIPENEM <=0.25 SENSITIVE Sensitive     TRIMETH /SULFA  <=20 SENSITIVE Sensitive     PIP/TAZO <=4 SENSITIVE Sensitive ug/mL    * RARE ENTEROBACTER SAKAZAKII   Klebsiella pneumoniae - MIC*    AMPICILLIN  >=32 RESISTANT Resistant     CEFEPIME <=0.12 SENSITIVE Sensitive     CEFTAZIDIME <=1 SENSITIVE Sensitive     CEFTRIAXONE  <=0.25 SENSITIVE Sensitive     CIPROFLOXACIN  <=0.25 SENSITIVE Sensitive     GENTAMICIN <=1 SENSITIVE Sensitive     IMIPENEM <=0.25 SENSITIVE Sensitive     TRIMETH /SULFA  <=20 SENSITIVE Sensitive     AMPICILLIN /SULBACTAM 8 SENSITIVE Sensitive     PIP/TAZO <=4 SENSITIVE Sensitive ug/mL    * FEW KLEBSIELLA PNEUMONIAE      Assessment & Plan:   Problem List Items Addressed This Visit       Cardiovascular and Mediastinum   Hypertension associated with diabetes (HCC)   Chronic.  Well controlled.  Continue Lisinopril  10mg  and hydrochlorothiazide  25mg .   Labs are up to date.  Will check labs again at next visit.  Followed by Cardiology.  Had a Cath in the spring.  Blood pressures are well controlled at home.  Follow up in 6 months.  Call sooner if concerns arise.       Relevant Medications   lisinopril  (ZESTRIL ) 5 MG tablet   PVD (peripheral vascular disease) (HCC)   Chronic.  Controlled.  Continue with current medication regimen.  Labs ordered today.  Return to clinic in 6 months for reevaluation.  Call sooner if concerns arise.        Relevant Medications   lisinopril  (ZESTRIL ) 5 MG tablet     Endocrine   Type 2 diabetes mellitus with diabetic neuropathy, with long-term current use of insulin  (HCC) - Primary (Chronic)   Chronic. Controlled.  Sugars have been elevated recently due to abdominal wall abscess.  Last A1c was 7.6%.  On Insulin  42u am and 82pm.  Sugars are running <200.  He is also on Mounjaro 10mg  weekly.  He is managed by Dr. Damian.   Doing well with Farxiga .  Follow up in 6 months.  Call sooner if concerns arise.       Relevant Medications   lisinopril  (ZESTRIL ) 5 MG tablet   Other Relevant Orders   Comprehensive metabolic panel with GFR   Hemoglobin A1c   Hyperlipidemia associated with type 2 diabetes mellitus (HCC)   Chronic.  Controlled.  Continue with current medication regimen of Crestor  40mg  and Vescepa.  Labs ordered today.  Return to clinic in 6 months for reevaluation.  Call sooner if concerns arise.       Relevant Medications  lisinopril  (ZESTRIL ) 5 MG tablet   Other Relevant Orders   Lipid panel     Other   Iron deficiency anemia   Chronic.  Controlled.  Continue with current medication regimen.  Labs ordered today.  Return to clinic in 6 months for reevaluation.  Call sooner if concerns arise.        Relevant Orders   CBC With Diff/Platelet   Abdominal wall abscess   Followed by Surgery.  Decreased drainage over the last two days.  Follows up with Dr. Jordis on Wednesday.      Other Visit  Diagnoses       Need for vaccination against Streptococcus pneumoniae       Relevant Orders   Pneumococcal conjugate vaccine 20-valent (Prevnar 20) (Completed)        Follow up plan: Return in about 6 months (around 03/10/2024) for HTN, HLD, DM2 FU.

## 2023-09-09 NOTE — Assessment & Plan Note (Signed)
 Chronic. Controlled.  Sugars have been elevated recently due to abdominal wall abscess.  Last A1c was 7.6%.  On Insulin  42u am and 82pm.  Sugars are running <200.  He is also on Mounjaro 10mg  weekly.  He is managed by Dr. Damian.   Doing well with Farxiga .  Follow up in 6 months.  Call sooner if concerns arise.

## 2023-09-10 LAB — COMPREHENSIVE METABOLIC PANEL WITH GFR
ALT: 34 IU/L (ref 0–44)
AST: 31 IU/L (ref 0–40)
Albumin: 4.4 g/dL (ref 3.8–4.9)
Alkaline Phosphatase: 60 IU/L (ref 44–121)
BUN/Creatinine Ratio: 9 — ABNORMAL LOW (ref 10–24)
BUN: 17 mg/dL (ref 8–27)
Bilirubin Total: 0.2 mg/dL (ref 0.0–1.2)
CO2: 19 mmol/L — ABNORMAL LOW (ref 20–29)
Calcium: 9.3 mg/dL (ref 8.6–10.2)
Chloride: 99 mmol/L (ref 96–106)
Creatinine, Ser: 1.8 mg/dL — ABNORMAL HIGH (ref 0.76–1.27)
Globulin, Total: 2.6 g/dL (ref 1.5–4.5)
Glucose: 163 mg/dL — ABNORMAL HIGH (ref 70–99)
Potassium: 4.6 mmol/L (ref 3.5–5.2)
Sodium: 135 mmol/L (ref 134–144)
Total Protein: 7 g/dL (ref 6.0–8.5)
eGFR: 43 mL/min/{1.73_m2} — ABNORMAL LOW (ref >59)

## 2023-09-10 LAB — CBC WITH DIFF/PLATELET
Basophils Absolute: 0.1 10*3/uL (ref 0.0–0.2)
Basos: 1 %
EOS (ABSOLUTE): 0.3 10*3/uL (ref 0.0–0.4)
Eos: 4 %
Hematocrit: 39.2 % (ref 37.5–51.0)
Hemoglobin: 11.7 g/dL — ABNORMAL LOW (ref 13.0–17.7)
Immature Grans (Abs): 0 10*3/uL (ref 0.0–0.1)
Immature Granulocytes: 0 %
Lymphocytes Absolute: 1 10*3/uL (ref 0.7–3.1)
Lymphs: 15 %
MCH: 23.8 pg — ABNORMAL LOW (ref 26.6–33.0)
MCHC: 29.8 g/dL — ABNORMAL LOW (ref 31.5–35.7)
MCV: 80 fL (ref 79–97)
Monocytes Absolute: 0.9 10*3/uL (ref 0.1–0.9)
Monocytes: 12 %
Neutrophils Absolute: 4.6 10*3/uL (ref 1.4–7.0)
Neutrophils: 67 %
Platelets: 325 10*3/uL (ref 150–450)
RBC: 4.92 x10E6/uL (ref 4.14–5.80)
RDW: 15.9 % — ABNORMAL HIGH (ref 11.6–15.4)
WBC: 6.9 10*3/uL (ref 3.4–10.8)

## 2023-09-10 LAB — HEMOGLOBIN A1C
Est. average glucose Bld gHb Est-mCnc: 154 mg/dL
Hgb A1c MFr Bld: 7 % — ABNORMAL HIGH (ref 4.8–5.6)

## 2023-09-10 LAB — LIPID PANEL
Chol/HDL Ratio: 5.5 ratio — ABNORMAL HIGH (ref 0.0–5.0)
Cholesterol, Total: 104 mg/dL (ref 100–199)
HDL: 19 mg/dL — ABNORMAL LOW (ref >39)
LDL Chol Calc (NIH): 27 mg/dL (ref 0–99)
Triglycerides: 414 mg/dL — ABNORMAL HIGH (ref 0–149)
VLDL Cholesterol Cal: 58 mg/dL — ABNORMAL HIGH (ref 5–40)

## 2023-09-13 ENCOUNTER — Other Ambulatory Visit

## 2023-09-13 ENCOUNTER — Encounter: Payer: Self-pay | Admitting: Nurse Practitioner

## 2023-09-13 ENCOUNTER — Ambulatory Visit: Payer: Self-pay | Admitting: Nurse Practitioner

## 2023-09-13 DIAGNOSIS — R21 Rash and other nonspecific skin eruption: Secondary | ICD-10-CM

## 2023-09-13 DIAGNOSIS — Z794 Long term (current) use of insulin: Secondary | ICD-10-CM

## 2023-09-14 LAB — HEMOGLOBIN AND HEMATOCRIT, BLOOD
Hematocrit: 40 % (ref 37.5–51.0)
Hemoglobin: 12.1 g/dL — ABNORMAL LOW (ref 13.0–17.7)

## 2023-09-14 LAB — TESTOSTERONE: Testosterone: 349 ng/dL (ref 264–916)

## 2023-09-14 LAB — PSA: Prostate Specific Ag, Serum: 1.3 ng/mL (ref 0.0–4.0)

## 2023-09-14 NOTE — Progress Notes (Signed)
 Ref Provider: Dr. Arch PCP: Dr. Deidre, [facility]  Assessment and Plan:   In most patients we give written parts of assessment and plan to patient under Patient Instructions/After Visit Summary. So some parts are directed to patient. Assessment & Plan Diabetic peripheral neuropathy Worsening neuropathy with increased pain, burning, and tingling in the legs and feet, particularly in the evenings. Gabapentin  is no longer effective. Higher doses of gabapentin  can increase the risk of side effects, especially with concurrent medications like amitriptyline  and Norco. - Continue gabapentin  for now - Consider alpha lipoic acid over the counter Alpha-lipoic acid is a naturally occurring, biological antioxidant that might help with many chronic diseases, mostly diabetic peripheral neuropathy, but also can help other types of neuropathies as well. Usually given 600 mg/day, may take 3-5 wks before some symptomatic relief occurs. Diabetic patients may need to reduce their diabetic meds. It may exacerbate thiamine  deficiency in patients with chronic malnutrition e.g. alcoholism. - Warren's Neuropathic Pain Cream We will order a compound neuropathic pain cream through [employer]. This cream contains Bupivacaine  1%, Doxepin 3%, Gabapentin  6%, Pentoxifylline 3%, and Topiramate 1%.  Address: [address], [place], [place] [address] Phone: [phone] - Prescribe compression recovery boots - Prescribe diabetic shoes - Discussed alternative treatments including pregabalin  and alpha lipoic acid. Advised on potential interactions with current medications and the need to monitor blood sugar levels closely.  Restless leg syndrome Symptoms of restless legs with an urge to move them, particularly at night. Possible iron deficiency contributing to symptoms. - Refer to hematologist for iron infusions - Discussed the potential benefit of iron infusions to alleviate symptoms.  Iron deficiency  anemia Possible iron deficiency. He has been taking iron supplements every two to three days. - Refer to hematologist for iron infusions - Discussed the option of iron infusions for better tolerance and effectiveness.  Cerebral small vessel disease Importance of controlling blood sugar, blood pressure, and cholesterol to manage hardening of small blood vessels in the brain. - Continue aspirin  81 mg daily - Monitor blood sugar, blood pressure, and cholesterol levels  Abdominal Abscess  Recurrent abdominal infection with drainage of blood and yellow discharge. Hernia repair and abscess with E. Coli. Currently not on antibiotics.  - Follow up with surgeon tomorrow   Results RADIOLOGY Brain MRI: cerebral small vessel disease  Return in about 6 months (around 03/16/2024) for Neuropathy, With Allyson Stallion, NP.   Interim History date 09/14/2023   Mr. Hout is a 58 y.o. male here for treatment and evaluation of Numbness, unaccompanied.  Mr. Sikora last visit was on 02/15/2023 History of Present Illness JARREAU CALLANAN is a 58 year old male with diabetic neuropathy who presents with worsening neuropathy symptoms.  He experiences worsening neuropathy symptoms, including increased pain, burning, and tingling sensations in his legs and feet, particularly in the evenings around 4 PM. His current medication, gabapentin , is no longer providing relief. He notes numbness ascending from his feet to his legs, which he did not feel before. He also takes oxycodone  for back pain, which helps his leg and foot pain during the day but wears off by evening. He has tried capsaicin  gel in the past without significant relief.  He has a history of diabetes and mentions that his feet feel restless, with an urge to move them, and they ache. He takes iron supplements every two to three days due to previous issues with iron levels. He has not tried alpha lipoic acid for his neuropathy  symptoms.  He mentions a past  surgical history of hernia repairs, with complications including an artery cut during surgery in November 2023, leading to a prolonged hospital stay. He developed an abscess with E. coli six years ago, which was treated and healed but has recently recurred. He completed a course of antibiotics, but the infection has returned with drainage of blood and yellow fluid. He is scheduled to see his surgeon tomorrow.  He reports occasional alcohol consumption, having had one drink in the past six weeks. He works as a Counsellor.    Disease Summary: (Aggregate of information from previous visits)   Mr. Strohecker is a right handed 58 y.o. male Disability here for evaluation of Numbness , referred by Dr. Arch.  Pain and paresthesia in both feet - in a patient with poorly controlled diabetes and poor renal function - likely due to Diabetic peripheral neuropathy: Patient states he is here today for neuropathy symptom in his bilateral feet. His sensory symptoms include: burning, tingling, pins and needles, pain. His motor symptoms include: cramps. His autonomic symptoms include: constipation, erectile dysfunction, and light headedness when stand up suddenly. His symptoms started in 2019, they came on gradually and they are slowly getting worse. Elevation make his symptom better along with Gabapentin . Standing, walking, and wearing shoes make his symptoms worse. He mentioned that he can only wear his shoes maybe for about 4 hours due to the swelling of his feet.   He was diagnosed with diabetes in 2010, he is currently on insulin , his current HgA1c is 9.1and his worst HgA1c was 9.2. He denies Hypothyroidism, and nutritional deficiency. He does have Kidney Disease. He does consume alcohol, 6 cans of beer per week. He states maybe 2 per day, other than that he mainly drink bottled water. He is going through a stressful situation financially and he lost 3 brothers. He has not had any international travel, no unusual  food intake, or antibiotic use that caused his symptoms. He does have family history of neuropathy, his father and older brother (who also are diabetic). He has not tried any alternative therapies, no recent lab work completed, and the only medication he has tried is Gabapentin . He would like to know would Hydrocodone  with Tylenol  sound good for his foot pains?    Medications: Gabapentin , Nortriptyline, Amitriptyline , Pregabalin , Duloxetine (allergy), Norco Others: Jerlene was not helpful - exacerbated pain  Imbalance: He mentioned that he does have problems with his balance mainly in the mornings, he denies having any recent falls.  Diabetes - following endocrinology with Kreg Charnley, NP - most recent HgA1c checked 09/12/2019 was 9.1 - on insulin : patient was diagnosed with diabetes in 2010. He is currently on insulin . His overall diabetes control os poor. His worst HgA1c was 9.2 and his most recent HgA1c checked 09/12/2019 was 9.1   He started smoking around age 81 and stopped at age 42 (April/May 2021). He smoked on average a pack a day.   He started drinking around age 68, he started drinking heavy around his 56. He states he did not drink as much in his 40's. He states he drank heavily for approximately 12-14 years. He denies previous DUI.   Insomnia- difficulty with maintenance of sleep, likely secondary to sleep apnea. Patient is taking amitriptyline   Restless leg syndrome- Magnesium , Iron, B12 supplementation   Chronic left facial weakness   MRI Brain without contrast 11/17/2022 - 1. No evidence of an acute intracranial abnormality. 2. There are a few small  nonspecific T2 FLAIR hyperintense chronic insults within the cerebral white matter. Otherwise unremarkable MRI appearance of the brain. 3. Mild paranasal sinus mucosal thickening. 4. Small-volume fluid within the left mastoid air cells.   Brain fog -SLUMS 10/30/2022 - 27/30  Near Syncopal episode-02/03/2022 -near syncope/low blood  pressure.  Had recent hiatal hernia repair at this time.  CT abdomen with oral contrast showed no evidence of enteric leak.  Discharged with primary care follow-up.  Physical Exam   Vitals Vitals:   09/14/23 1422 09/14/23 1424  BP:  130/68  Pulse:  102  SpO2:  96%  Weight: (!) 109 kg (240 lb 6.4 oz)   Height: 188 cm (6' 2)   PainSc:    4  PainLoc:  Foot       Body mass index is 30.87 kg/m.  (Some of the exam changes noted are from previous clinical observations)  lightly high arches  Slight hammering of toes    Current Outpatient Medications on File Prior to Visit  Medication Sig Dispense Refill  . albuterol  (PROVENTIL ) 2.5 mg /3 mL (0.083 %) nebulizer solution Take 3 mLs (2.5 mg total) by nebulization every 6 (six) hours as needed 300 mL 12  . albuterol  MDI, PROVENTIL , VENTOLIN , PROAIR , HFA 90 mcg/actuation inhaler Inhale 2 inhalations into the lungs every 6 (six) hours as needed 18 g 5  . amitriptyline  (ELAVIL ) 10 MG tablet Take 3 tablets (30 mg total) by mouth at bedtime 90 tablet 8  . ascorbic acid, vitamin C, (VITAMIN C) 500 MG tablet Take 500 mg by mouth once daily    . aspirin  81 MG EC tablet Take 81 mg by mouth once daily    . budesonide -glycopyrrolate -formoterol  (BREZTRI  AEROSPHERE) 160-9-4.8 mcg/actuation inhaler Inhale 2 inhalations into the lungs 2 (two) times daily 32.1 g 2  . clindamycin-benzoyl peroxide  (DUAC) 1.2 %(1 % base) -5 % topical gel APPLY TO AFFECTED AREAS IN THE MORNING    . clotrimazole  (LOTRIMIN ) 1 % cream Apply topically 2 (two) times daily 60 g 1  . cyanocobalamin  (VITAMIN B12) 1000 MCG tablet Take 1 tablet (1,000 mcg total) by mouth once daily 30 tablet 11  . ECLIPSE SYRINGE 3 mL 25 gauge x 1 Syrg     . EPINEPHrine  (EPIPEN ) 0.3 mg/0.3 mL auto-injector Inject into the muscle    . fluticasone  propionate (FLONASE ) 50 mcg/actuation nasal spray SHAKE LIQUID AND USE 2 SPRAYS IN EACH NOSTRIL EVERY DAY 16 g 5  . gabapentin  (NEURONTIN ) 600 MG tablet  Take 1 tablet (600 mg total) by mouth 3 (three) times daily 90 tablet 8  . hydroCHLOROthiazide  (HYDRODIURIL ) 25 MG tablet TAKE 1 TABLET(25 MG) BY MOUTH EVERY DAY 90 tablet 1  . HYDROcodone -acetaminophen  (NORCO) 5-325 mg tablet Take 1 tablet by mouth 2 (two) times daily    . icosapent ethyL (VASCEPA) 1 gram capsule Take 2 capsules (2 g total) by mouth 2 (two) times daily 120 capsule 11  . insulin  NPH-REGULAR (HUMULIN 70/30 PEN) 100 unit/mL (70-30) pen injector Inject 84 units each morning before breakfast and 42 units each evening before dinner 45 mL 11  . ipratropium-albuteroL  (DUO-NEB) nebulizer solution Take 3 mLs by nebulization 4 (four) times daily as needed for Wheezing for up to 360 days 360 mL 11  . isosorbide mononitrate (IMDUR) 30 MG ER tablet Take 1 tablet (30 mg total) by mouth once daily 30 tablet 1  . lisinopriL  (ZESTRIL ) 5 MG tablet Take 1 tablet (5 mg total) by mouth once daily 90 tablet 3  .  lubiprostone (AMITIZA) 8 MCG capsule Take 1 capsule (8 mcg total) by mouth 2 (two) times daily with meals (Patient taking differently: Take 8 mcg by mouth 2 (two) times daily as needed for Constipation) 60 capsule 3  . magnesium  oxide 500 mg Tab Take Over The Counter Magnesium  Oxide 400 to 600 mg per day (if develop diarrhea - try magnesium  glycinate 400 to 600 mg per day) 30 tablet 11  . metFORMIN  (GLUCOPHAGE -XR) 500 MG XR tablet Take 4 tablets (2,000 mg total) by mouth daily with dinner (Patient taking differently: Take 1,000 mg by mouth 2 (two) times daily) 360 tablet 3  . montelukast  (SINGULAIR ) 10 mg tablet Take 1 tablet (10 mg total) by mouth at bedtime 30 tablet 11  . multivitamin tablet Take 1 tablet by mouth once daily    . naloxone  (NARCAN ) 4 mg/actuation nasal spray Place 1 spray into one nostril    . neomycin -polymyxin-dexAMETHasone  (MAXITROL) ophthalmic suspension Place 1 drop into both eyes as needed    . pantoprazole  (PROTONIX ) 40 MG DR tablet Take 1 tablet (40 mg total) by mouth 2  (two) times daily before meals 60 tablet 3  . rosuvastatin  (CRESTOR ) 40 MG tablet TAKE 1 TABLET EVERY DAY 90 tablet 3  . tazarotene  (TAZORAC ) 0.1 % cream Apply 1 Application topically at bedtime    . testosterone  cypionate (DEPO-TESTOSTERONE ) 200 mg/mL injection Inject 1 mL (200 mg total) into the muscle every 14 (fourteen) days (Patient taking differently: Inject 200 mg into the muscle Every 10 days) 2 mL 2  . tirzepatide (MOUNJARO) 10 mg/0.5 mL pen injector Inject 0.5 mLs (10 mg total) subcutaneously once a week 6 mL 3  . tretinoin  (RETIN-A ) 0.025 % cream Apply topically at bedtime    . triamcinolone  0.1 % cream Apply 1 Application topically 2 (two) times daily    . VENTOLIN  HFA 90 mcg/actuation inhaler INHALE 2 PUFFS BY MOUTH INTO THE LUNGS EVERY 6 HOURS AS NEEDED 18 g 0  . blood glucose diagnostic test strip 1 each (1 strip total) by XX route 3 (three) times daily Use as instructed. 100 each 11  . metroNIDAZOLE  (METROGEL ) 1 % gel Apply 1 Application topically once daily (Patient not taking: Reported on 09/14/2023)    . pen needle, diabetic 32 gauge x 5/16 needle Use 2 (two) times daily 100 each 5   No current facility-administered medications on file prior to visit.   Past Medical History:  Past Medical History:  Diagnosis Date  . Aortic atherosclerosis ()   . Asthma   . COPD (chronic obstructive pulmonary disease) (CMS/HHS-HCC)   . Coronary artery disease involving native coronary artery of native heart without angina pectoris 05/06/2022  . DDD (degenerative disc disease), lumbosacral 10/29/2021  . Depression 11/2018   had to stop working disability.  . Diabetic peripheral neuropathy (CMS/HHS-HCC)   . GERD (gastroesophageal reflux disease)   . Hyperlipidemia   . Hypertension   . Hypogonadism male   . Kidney stones   . Sleep apnea   . Type 2 diabetes mellitus (CMS/HHS-HCC)     Past Surgical History:  Past Surgical History:  Procedure Laterality Date  . MUSCLE BIOPSY  2015    mass on lungs removed  . BRONCHOSCOPY FLEXIBLE Bilateral 08/31/2014   Procedure: PR BRONCH W BRONCHL ALVEOLAR LAV;  Surgeon: Lyle Coward, MD;  Location: [facility]O [facility];  Service: Pulmonary;  Laterality: Bilateral;  . BRONCHOSCOPY FLEXIBLE  08/31/2014   Procedure: PR BRONCH W BRONCH/ENDOBRONCH BX;  Surgeon: Lyle Coward,  MD;  Location: [facility] ENDO BRONCH;  Service: Pulmonary;;  . BRONCHOSCOPY RIGID N/A 09/13/2014   Procedure: BRONCHOSCOPY RIGID;  Surgeon: Lyle Coward, MD;  Location: [facility] OPERATING ROOMS;  Service: Pulmonary;  Laterality: N/A;  . ESOPHAGOGASTRODOUDENOSCOPY W/BIOPSY  12/04/2016   Procedure: ESOPHAGOGASTRODUODENOSCOPY, FLEXIBLE, TRANSORAL; WITH BIOPSY, SINGLE OR MULTIPLE;  Surgeon: Alejos Ozell Soulier, MD;  Location: [facility] ENDO/BRONCH;  Service: Gastroenterology;;  . COLONOSCOPY W/BIOPSY  12/04/2016   Procedure: COLONOSCOPY, FLEXIBLE; WITH BIOPSY, SINGLE OR MULTIPLE;  Surgeon: Alejos Ozell Soulier, MD;  Location: [facility] ENDO/BRONCH;  Service: Gastroenterology;;  . ORIF L lateral malleolus and syndesmosis repair Left 03/23/2017   Dr.Patel  . REPAIR DIAPHRAGMATIC HERNIA  01/19/2022   Robotic converted to open paraesophageal hernia repair with Nissen fundoplication and umbilical hernia repair.  . removal of Abscess  07/28/2023   abdominal  . APPENDECTOMY    . CIRCUMCISION     As adult  . OTHER SURGERY     2 Hernia repairs along with an emergency surgery to repair obstructed artery  . Removal kidney stone    . VASECTOMY     Family History:  Family History  Problem Relation Name Age of Onset  . Lung cancer Mother    . Diabetes type II Father Gaither Newer   . High blood pressure (Hypertension) Father Gaither Newer   . Colon cancer Father Gaither Newer   . Hearing loss Father Gaither Newer   . Diabetes Father Jayleon Mcfarlane   . Diabetes type II Brother Micheal Zwahlen   . Myocardial Infarction (Heart attack) Brother Micheal Currey   . Heart disease Brother Micheal Holbein   . Coronary  Artery Disease (Blocked arteries around heart) Brother Micheal Belding        died from heart disease.  . High blood pressure (Hypertension) Brother Micheal Gornick   . Esophageal cancer Brother Reyes Newer   . Cancer Brother Izek Corvino        diabities. Esophagus cancer at [facility] for 1 year he died could not recover organ failure.  . Coronary Artery Disease (Blocked arteries around heart) Brother Reyes Newer        Quade tripple bypass  . Diabetes Brother Reyes Newer   . High blood pressure (Hypertension) Brother Reyes Newer   . Kidney disease Brother Reyes Newer   . Anesthesia problems Neg Hx    . Malignant hyperthermia Neg Hx     Social History:  Social History   Socioeconomic History  . Marital status: Single  . Number of children: 2  Occupational History  . Occupation: Counsellor  Tobacco Use  . Smoking status: Former    Current packs/day: 0.00    Average packs/day: 0.3 packs/day for 30.0 years (7.5 ttl pk-yrs)    Types: Cigarettes    Start date: 07/31/1989    Quit date: 08/01/2019    Years since quitting: 4.1  . Smokeless tobacco: Never  . Tobacco comments:    Quitting. Occasionally smokes cigarettes  Vaping Use  . Vaping status: Never Used  Substance and Sexual Activity  . Alcohol use: Not Currently  . Drug use: Never  . Sexual activity: Not Currently    Partners: Female    Birth control/protection: Surgical  Social History Narrative   The patient is divorced.  He lives alone.  He has 2 adult children.  He reports drinking between 15 and 20 beers a week, negative cage.  He reports he is physically active, he has some caffeinated drinks.     Social  Drivers of Health   Financial Resource Strain: Low Risk  (06/07/2023)   Received from [facility]   Overall Financial Resource Strain (CARDIA)   . Difficulty of Paying Living Expenses: Not hard at all  Food Insecurity: No Food Insecurity (07/28/2023)   [facility]   Hunger Vital Sign    . Within the past 12 months, you worried that your food would run out before you got the money to buy more.: Never true   . Within the past 12 months, the food you bought just didn't last and you didn't have money to get more.: Never true  Transportation Needs: No Transportation Needs (07/28/2023)   Received from [facility] - Transportation   . Lack of Transportation (Medical): No   . Lack of Transportation (Non-Medical): No  Physical Activity: Inactive (06/07/2023)   Received from [facility]   Exercise Vital Sign   . On average, how many days per week do you engage in moderate to strenuous exercise (like a brisk walk)?: 0 days   . On average, how many minutes do you engage in exercise at this level?: 0 min  Stress: No Stress Concern Present (06/07/2023)   Received from [facility] - Occupational Stress Questionnaire   . Feeling of Stress : Only a little  Social Connections: Moderately Isolated (07/28/2023)   Received from [facility]   Social Connection and Isolation Panel   . In a typical week, how many times do you talk on the phone with family, friends, or neighbors?: Three times a week   . How often do you get together with friends or relatives?: Twice a week   . How often do you attend church or religious services?: Never   . Do you belong to any clubs or organizations such as church groups, unions, fraternal or athletic groups, or school groups?: No   . How often do you attend meetings of the clubs or organizations you belong to?: Never   . Are you married, widowed, divorced, separated, never married, or living with a partner?: Living with partner  Housing Stability: Unknown (06/08/2023)   Housing Stability Vital Sign   . Homeless in the Last Year: No   Allergies:  Allergies  Allergen Reactions  . Ciprofloxacin  Hives and Itching    Burning of skin   . Glipizide Palpitations and Dizziness  . Cymbalta [Duloxetine] Nausea and  Anxiety  . Dupixent Pen [Dupilumab] Rash

## 2023-09-15 ENCOUNTER — Ambulatory Visit: Admitting: Surgery

## 2023-09-15 ENCOUNTER — Encounter: Payer: Self-pay | Admitting: Surgery

## 2023-09-15 VITALS — BP 129/82 | HR 94 | Temp 98.4°F | Ht 74.0 in | Wt 238.6 lb

## 2023-09-15 DIAGNOSIS — L02211 Cutaneous abscess of abdominal wall: Secondary | ICD-10-CM | POA: Diagnosis not present

## 2023-09-15 NOTE — Patient Instructions (Signed)
 Incision and Drainage, Care After After incision and drainage, it is common to have: Pain or discomfort around the incision site. Blood, fluid, or pus (drainage) from the incision. Redness and firm skin around the incision site. Follow these instructions at home: Medicines Take over-the-counter and prescription medicines only as told by your health care provider. If you were prescribed antibiotics, take them as told by your provider. Do not stop using the antibiotic even if you start to feel better. Do not apply creams, ointments, or liquids unless you have been told to by your provider. Wound care Follow instructions from your provider about how to take care of your wound. Make sure you: Wash your hands with soap and water for at least 20 seconds before and after you change your bandage (dressing). If soap and water are not available, use hand sanitizer. Change your dressing and any packing as told by your provider. If the dressing is dry or stuck when you try to remove it, moisten or wet it with saline or water. This will help you remove it without harming your skin or tissues. If your wound is packed, leave it in place until your provider tells you to remove it. To remove it, moisten or wet the packing with saline or water. Leave stitches (sutures), skin glue, or tape strips in place. These skin closures may need to stay in place for 2 weeks or longer. If tape strip edges start to loosen and curl up, you may trim the loose edges. Do not remove tape strips completely unless your provider tells you to do that. Check your wound every day for signs of infection. Check for: More redness, swelling, or pain. More fluid or blood. Warmth. Pus or a bad smell. If you were sent home with a drain tube in place, follow instructions from your provider about: How to empty it. How to care for it at home. Be careful when you get rid of used dressings, wound packing, or drainage. Activity Rest the  affected area. Return to your normal activities as told by your provider. Ask your provider what activities are safe for you. General instructions Do not use any products that contain nicotine or tobacco. These products include cigarettes, chewing tobacco, and vaping devices, such as e-cigarettes. These can delay incision healing after surgery. If you need help quitting, ask your provider. Do not take baths, swim, or use a hot tub until your provider approves. Ask your provider if you may take showers. You may only be allowed to take sponge baths. The incision will keep draining. It is normal to have some clear or slightly bloody drainage. The amount of drainage should go down each day. Keep all follow-up visits. Your provider will need to make sure that your incision is healing well and that there are no problems. Your health care provider may give you more instructions. Make sure you know what you can and cannot do Contact a health care provider if: Your cyst or abscess comes back. You have any signs of infection. You notice red streaks that spread away from the incision site. You have a fever or chills. Get help right away if: You have severe pain or bleeding. You become short of breath. You have chest pain. You have signs of a severe infection. You may notice changes in your incision area, such as: Swelling that makes the skin feel hard. Numbness or tingling. Sudden increase in redness. Your skin color may change from red to purple, and then to dark  spots. Blisters, ulcers, or splitting of the skin. These symptoms may be an emergency. Get help right away. Call 911. Do not wait to see if the symptoms will go away. Do not drive yourself to the hospital. This information is not intended to replace advice given to you by your health care provider. Make sure you discuss any questions you have with your health care provider. Document Revised: 10/20/2021 Document Reviewed: 10/20/2021 Elsevier  Patient Education  2024 [employer].

## 2023-09-15 NOTE — Progress Notes (Signed)
 Outpatient Surgical Follow Up  09/15/2023  Darren Allen is an 58 y.o. male.   Chief Complaint  Patient presents with   Routine Post Op    I&D abdominal wall abscess 09/03/23    HPI: Darren Allen is a 58 y.o. male WELL KNOWN TO ME  s/p ventral hernia repair on 04/08/2023 w Ventralex, he developed an abscess and was I/D 6/20. Cultures grew Klebsiella and pseudomonas, he just completed a/bs. He is doing ok, some decrease apetite and nausea, otherwise doing well, no fevers or chills. CT pers reviewed showing abscess.   Past Medical History:  Diagnosis Date   Allergy    Anxiety    Asthma    Chronic kidney disease    Chronic lower back pain    a.) followed by pain management; on COT   COPD (chronic obstructive pulmonary disease) (HCC)    Coronary artery disease    a.) cCTA 06/30/2021: Ca2+ = 216 (84th %ile; 25049% pRCA and pLAD))   DDD (degenerative disc disease), lumbosacral    Depression    Diabetic peripheral neuropathy (HCC)    Diastolic dysfunction 12/27/2020   a.) TTE 12/27/2020: EF >55%, no RWMAs, G1DD, norm RVSF, triv MR/TR?PR   Diverticulosis    Emphysema of lung (HCC)    Erectile dysfunction    a.) on PDE5i (tadalafil )   GERD (gastroesophageal reflux disease)    Hepatic steatosis    History of kidney stones    Hyperlipidemia    Hypertension    Hypogonadism male    a.) on exogenous TRT (depotestosterone cypionate)   IDA (iron deficiency anemia)    Long term current use of opiate analgesic    a.) followed by pain management; naloxone  Rx available   OSA on CPAP    Osteoporosis    Paraesophageal hernia    a.) s/p robotic assisted repair 01/2022   PVD (peripheral vascular disease) (HCC)    Sleep apnea    T2DM (type 2 diabetes mellitus) (HCC)    Umbilical hernia    a.) s/p repair 01/20/2022   Ventral hernia     Past Surgical History:  Procedure Laterality Date   ABDOMINAL HYSTERECTOMY     APPENDECTOMY     BRONCHOSCOPY  2016   CIRCUMCISION     COLONOSCOPY WITH  PROPOFOL  N/A 12/31/2021   Procedure: COLONOSCOPY WITH PROPOFOL ;  Surgeon: Therisa Bi, MD;  Location: [facility] ENDOSCOPY;  Service: Gastroenterology;  Laterality: N/A;   ESOPHAGOGASTRODUODENOSCOPY N/A 12/31/2021   Procedure: ESOPHAGOGASTRODUODENOSCOPY (EGD);  Surgeon: Therisa Bi, MD;  Location: [facility] ENDOSCOPY;  Service: Gastroenterology;  Laterality: N/A;   HERNIA REPAIR  07/2023   INCISION AND DRAINAGE PERIRECTAL ABSCESS N/A 02/04/2015   Procedure: IRRIGATION AND DEBRIDEMENT PERIRECTAL ABSCESS;  Surgeon: Lonni Brands, MD;  Location: [facility] ORS;  Service: General;  Laterality: N/A;   INSERTION OF MESH  01/20/2022   Procedure: INSERTION OF MESH;  Surgeon: Jordis Laneta FALCON, MD;  Location: [facility] ORS;  Service: General;;   INSERTION OF MESH N/A 04/08/2023   Procedure: INSERTION OF MESH;  Surgeon: Jordis Laneta FALCON, MD;  Location: [facility] ORS;  Service: General;  Laterality: N/A;   KIDNEY STONE SURGERY Right    LEFT HEART CATH AND CORONARY ANGIOGRAPHY Left 08/16/2023   Procedure: LEFT HEART CATH AND CORONARY ANGIOGRAPHY;  Surgeon: Florencio Cara BIRCH, MD;  Location: [facility] INVASIVE CV LAB;  Service: Cardiovascular;  Laterality: Left;   lung mass removal N/A    MUSCLE BIOPSY     ORIF ANKLE FRACTURE Left 03/23/2017  Procedure: OPEN REDUCTION INTERNAL FIXATION (ORIF) ANKLE FRACTURE;  Surgeon: Tobie Priest, MD;  Location: [facility] ORS;  Service: Orthopedics;  Laterality: Left;   RECTAL EXAM UNDER ANESTHESIA  02/04/2015   Procedure: RECTAL EXAM UNDER ANESTHESIA;  Surgeon: Lonni Brands, MD;  Location: [facility] ORS;  Service: General;;   SYNDESMOSIS REPAIR Left 03/23/2017   Procedure: SYNDESMOSIS REPAIR;  Surgeon: Tobie Priest, MD;  Location: [facility] ORS;  Service: Orthopedics;  Laterality: Left;   UMBILICAL HERNIA REPAIR  01/20/2022   Procedure: HERNIA REPAIR UMBILICAL ADULT;  Surgeon: Jordis Laneta FALCON, MD;  Location: [facility] ORS;  Service: General;;   VASECTOMY     VENTRAL HERNIA REPAIR N/A 04/08/2023   Procedure:  HERNIA REPAIR VENTRAL ADULT, open;  Surgeon: Jordis Laneta FALCON, MD;  Location: [facility] ORS;  Service: General;  Laterality: N/A;   XI ROBOTIC ASSISTED PARAESOPHAGEAL HERNIA REPAIR N/A 01/20/2022   Procedure: XI ROBOTIC ASSISTED PARAESOPHAGEAL HERNIA REPAIR, CONVERTED TO OPEN, RNFA to assist;  Surgeon: Jordis Laneta FALCON, MD;  Location: [facility] ORS;  Service: General;  Laterality: N/A;    Family History  Problem Relation Age of Onset   Lung cancer Mother 62   Cancer Mother    Colon cancer Father 5   Heart disease Father    Alcohol abuse Father    Cancer Father    Esophageal cancer Brother 81   Diabetes Brother    Cancer Brother    Kidney disease Brother    Heart disease Brother    Lung cancer Maternal Aunt    Cancer Paternal Aunt        unk type   Cancer Maternal Grandfather        unk type   Drug abuse Daughter     Social History:  reports that he has quit smoking. His smoking use included cigarettes. He has a 17 pack-year smoking history. He has been exposed to tobacco smoke. He uses smokeless tobacco. He reports that he does not currently use alcohol after a past usage of about 12.0 standard drinks of alcohol per week. He reports current drug use. Drug: Hydrocodone .  Allergies:  Allergies  Allergen Reactions   Glipizide Other (See Comments) and Palpitations    Shaky, feel bad Other reaction(s): Dizziness   Ciprofloxacin  Itching    Caused itching and burning    Cephalexin  Rash   Duloxetine Anxiety and Nausea Only   Dupixent [Dupilumab] Rash    Medications reviewed.    ROS Full ROS performed and is otherwise negative other than what is stated in HPI   BP 129/82   Pulse 94   Temp 98.4 F (36.9 C) (Oral)   Ht 6' 2 (1.88 m)   Wt 238 lb 9.6 oz (108.2 kg)   SpO2 98%   BMI 30.63 kg/m   Physical Exam Vitals and nursing note reviewed. Exam conducted with a chaperone present.  Constitutional:      Appearance: Normal appearance. He is not ill-appearing.  Pulmonary:      Effort: Pulmonary effort is normal. No respiratory distress.     Breath sounds: No stridor. Wheezing present.  Abdominal:     General: Abdomen is flat.     Palpations: Abdomen is soft. There is no mass.     Tenderness: There is no abdominal tenderness. There is no guarding or rebound.     Hernia: No hernia is present.     Comments: 2 x 1 cm wound w good granulation tissue, no exposed mesh, no infection. No peritonitis  Skin:  General: Skin is warm and dry.     Capillary Refill: Capillary refill takes less than 2 seconds.  Neurological:     General: No focal deficit present.     Mental Status: He is alert.  Psychiatric:        Mood and Affect: Mood normal.        Behavior: Behavior normal.        Thought Content: Thought content normal.        Judgment: Judgment normal.     Assessment/Plan: Abscess adjacent to mesh, seems to be improving and he is not peritonitic. Wounds is closing which is promising. No evidence of sepsis. I do want to see him in 1 week or so. We did talk about potential excision of the mesh and Phasix coverage, however I do not think we are at this point yet.  I personally spent a total of 30 minutes in the care of the patient today including performing a medically appropriate exam/evaluation, counseling and educating, placing orders, referring and communicating with other health care professionals, documenting clinical information in the EHR, independently interpreting and reviewing images studies and coordinating care.   Laneta Luna, MD [facility] General Surgeon

## 2023-09-16 ENCOUNTER — Other Ambulatory Visit

## 2023-09-20 ENCOUNTER — Encounter: Payer: Self-pay | Admitting: Nurse Practitioner

## 2023-09-20 ENCOUNTER — Other Ambulatory Visit: Payer: Self-pay | Admitting: Nurse Practitioner

## 2023-09-21 ENCOUNTER — Ambulatory Visit: Payer: Self-pay | Admitting: Urology

## 2023-09-21 ENCOUNTER — Encounter: Payer: Self-pay | Admitting: Nurse Practitioner

## 2023-09-21 MED ORDER — DAPAGLIFLOZIN PROPANEDIOL 10 MG PO TABS: 10.0000 mg | ORAL_TABLET | Freq: Every day | ORAL | 1 refills | Status: DC

## 2023-09-22 ENCOUNTER — Other Ambulatory Visit

## 2023-09-22 NOTE — Telephone Encounter (Signed)
 Duplicate request, signed on 09/21/23.  Requested Prescriptions  Pending Prescriptions Disp Refills   FARXIGA  10 MG TABS tablet [Pharmacy Med Name: FARXIGA  10 MG TABLET] 90 tablet 0    Sig: Take 1 tablet (10 mg total) by mouth daily before breakfast.     Endocrinology:  Diabetes - SGLT2 Inhibitors Failed - 09/22/2023  8:33 AM      Failed - Cr in normal range and within 360 days    Creatinine  Date Value Ref Range Status  03/13/2014 1.08 0.60 - 1.30 mg/dL Final   Creatinine, Ser  Date Value Ref Range Status  09/09/2023 1.80 (H) 0.76 - 1.27 mg/dL Final         Failed - eGFR in normal range and within 360 days    EGFR (African American)  Date Value Ref Range Status  03/13/2014 >60 >24mL/min Final  03/15/2012 >60  Final   GFR calc Af Amer  Date Value Ref Range Status  12/07/2019 59 (L) >60 mL/min Final   EGFR (Non-African Amer.)  Date Value Ref Range Status  03/13/2014 >60 >13mL/min Final    Comment:    eGFR values <85mL/min/1.73 m2 may be an indication of chronic kidney disease (CKD). Calculated eGFR, using the MRDR Study equation, is useful in  patients with stable renal function. The eGFR calculation will not be reliable in acutely ill patients when serum creatinine is changing rapidly. It is not useful in patients on dialysis. The eGFR calculation may not be applicable to patients at the low and high extremes of body sizes, pregnant women, and vegetarians.   03/15/2012 >60  Final    Comment:    eGFR values <64mL/min/1.73 m2 may be an indication of chronic kidney disease (CKD). Calculated eGFR is useful in patients with stable renal function. The eGFR calculation will not be reliable in acutely ill patients when serum creatinine is changing rapidly. It is not useful in  patients on dialysis. The eGFR calculation may not be applicable to patients at the low and high extremes of body sizes, pregnant women, and vegetarians.    GFR, Estimated  Date Value Ref Range  Status  08/31/2023 59 (L) >60 mL/min Final    Comment:    (NOTE) Calculated using the CKD-EPI Creatinine Equation (2021)    eGFR  Date Value Ref Range Status  09/09/2023 43 (L) >59 mL/min/1.73 Final         Passed - HBA1C is between 0 and 7.9 and within 180 days    Hgb A1c MFr Bld  Date Value Ref Range Status  09/09/2023 7.0 (H) 4.8 - 5.6 % Final    Comment:             Prediabetes: 5.7 - 6.4          Diabetes: >6.4          Glycemic control for adults with diabetes: <7.0          Passed - Valid encounter within last 6 months    Recent Outpatient Visits           1 week ago Type 2 diabetes mellitus with diabetic neuropathy, with long-term current use of insulin  [facility])   [place] [facility] [place], Darice, NP   3 months ago Chronic obstructive pulmonary disease, unspecified COPD type [facility])   [place] [facility] Melvin Darice, NP   4 months ago Recurrent boils   [place] [facility] Melvin Darice, NP  Future Appointments             In 1 week Claudene Lehmann, MD [facility]

## 2023-09-27 ENCOUNTER — Encounter: Payer: Self-pay | Admitting: Surgery

## 2023-09-27 ENCOUNTER — Ambulatory Visit (INDEPENDENT_AMBULATORY_CARE_PROVIDER_SITE_OTHER): Admitting: Surgery

## 2023-09-27 VITALS — BP 118/75 | HR 87 | Temp 98.5°F | Ht 74.0 in | Wt 240.8 lb

## 2023-09-27 DIAGNOSIS — Z09 Encounter for follow-up examination after completed treatment for conditions other than malignant neoplasm: Secondary | ICD-10-CM | POA: Diagnosis not present

## 2023-09-27 DIAGNOSIS — L02211 Cutaneous abscess of abdominal wall: Secondary | ICD-10-CM | POA: Diagnosis not present

## 2023-09-27 NOTE — Progress Notes (Unsigned)
 Outpatient Surgical Follow Up  09/27/2023  [name] is an 58 y.o. male.   Chief Complaint  Patient presents with  . Post-op Problem    I&D wall abscess    HPI:  Darren Allen is a 58 y.o. male WELL KNOWN TO ME  s/p ventral hernia repair on 04/08/2023 w Ventralex, he developed an abscess and was I/D 6/20. Cultures grew Klebsiella and pseudomonas, he jcompleted a/bs. He is doing ok, taking po, no pain, some intermittent serous drainage , no fevers or chills. CT pers reviewed showing abscess.     Past Medical History:  Diagnosis Date  . Allergy   . Anxiety   . Asthma   . Chronic kidney disease   . Chronic lower back pain    a.) followed by pain management; on COT  . COPD (chronic obstructive pulmonary disease) (HCC)   . Coronary artery disease    a.) cCTA 06/30/2021: Ca2+ = 216 (84th %ile; 25049% pRCA and pLAD))  . DDD (degenerative disc disease), lumbosacral   . Depression   . Diabetic peripheral neuropathy (HCC)   . Diastolic dysfunction 12/27/2020   a.) TTE 12/27/2020: EF >55%, no RWMAs, G1DD, norm RVSF, triv MR/TR?PR  . Diverticulosis   . Emphysema of lung (HCC)   . Erectile dysfunction    a.) on PDE5i (tadalafil )  . GERD (gastroesophageal reflux disease)   . Hepatic steatosis   . History of kidney stones   . Hyperlipidemia   . Hypertension   . Hypogonadism male    a.) on exogenous TRT (depotestosterone cypionate)  . IDA (iron deficiency anemia)   . Long term current use of opiate analgesic    a.) followed by pain management; naloxone  Rx available  . OSA on CPAP   . Osteoporosis   . Paraesophageal hernia    a.) s/p robotic assisted repair 01/2022  . PVD (peripheral vascular disease) (HCC)   . Sleep apnea   . T2DM (type 2 diabetes mellitus) (HCC)   . Umbilical hernia    a.) s/p repair 01/20/2022  . Ventral hernia     Past Surgical History:  Procedure Laterality Date  . ABDOMINAL HYSTERECTOMY    . APPENDECTOMY    . BRONCHOSCOPY  2016  . CIRCUMCISION     . COLONOSCOPY WITH PROPOFOL  N/A 12/31/2021   Procedure: COLONOSCOPY WITH PROPOFOL ;  Surgeon: Therisa Bi, MD;  Location: [facility] ENDOSCOPY;  Service: Gastroenterology;  Laterality: N/A;  . ESOPHAGOGASTRODUODENOSCOPY N/A 12/31/2021   Procedure: ESOPHAGOGASTRODUODENOSCOPY (EGD);  Surgeon: Therisa Bi, MD;  Location: [facility] ENDOSCOPY;  Service: Gastroenterology;  Laterality: N/A;  . HERNIA REPAIR  07/2023  . INCISION AND DRAINAGE PERIRECTAL ABSCESS N/A 02/04/2015   Procedure: IRRIGATION AND DEBRIDEMENT PERIRECTAL ABSCESS;  Surgeon: Lonni Brands, MD;  Location: [facility] ORS;  Service: General;  Laterality: N/A;  . INSERTION OF MESH  01/20/2022   Procedure: INSERTION OF MESH;  Surgeon: Jordis Laneta FALCON, MD;  Location: [facility] ORS;  Service: General;;  . INSERTION OF MESH N/A 04/08/2023   Procedure: INSERTION OF MESH;  Surgeon: Jordis Laneta FALCON, MD;  Location: [facility] ORS;  Service: General;  Laterality: N/A;  . KIDNEY STONE SURGERY Right   . LEFT HEART CATH AND CORONARY ANGIOGRAPHY Left 08/16/2023   Procedure: LEFT HEART CATH AND CORONARY ANGIOGRAPHY;  Surgeon: Florencio Cara BIRCH, MD;  Location: [facility] INVASIVE CV LAB;  Service: Cardiovascular;  Laterality: Left;  . lung mass removal N/A   . MUSCLE BIOPSY    . ORIF ANKLE FRACTURE Left 03/23/2017  Procedure: OPEN REDUCTION INTERNAL FIXATION (ORIF) ANKLE FRACTURE;  Surgeon: Tobie Priest, MD;  Location: [facility] ORS;  Service: Orthopedics;  Laterality: Left;  . RECTAL EXAM UNDER ANESTHESIA  02/04/2015   Procedure: RECTAL EXAM UNDER ANESTHESIA;  Surgeon: Lonni Brands, MD;  Location: [facility] ORS;  Service: General;;  . SYNDESMOSIS REPAIR Left 03/23/2017   Procedure: SYNDESMOSIS REPAIR;  Surgeon: Tobie Priest, MD;  Location: [facility] ORS;  Service: Orthopedics;  Laterality: Left;  . UMBILICAL HERNIA REPAIR  01/20/2022   Procedure: HERNIA REPAIR UMBILICAL ADULT;  Surgeon: Jordis Laneta FALCON, MD;  Location: [facility] ORS;  Service: General;;  . VASECTOMY    . VENTRAL HERNIA  REPAIR N/A 04/08/2023   Procedure: HERNIA REPAIR VENTRAL ADULT, open;  Surgeon: Jordis Laneta FALCON, MD;  Location: [facility] ORS;  Service: General;  Laterality: N/A;  . XI ROBOTIC ASSISTED PARAESOPHAGEAL HERNIA REPAIR N/A 01/20/2022   Procedure: XI ROBOTIC ASSISTED PARAESOPHAGEAL HERNIA REPAIR, CONVERTED TO OPEN, RNFA to assist;  Surgeon: Jordis Laneta FALCON, MD;  Location: [facility] ORS;  Service: General;  Laterality: N/A;    Family History  Problem Relation Age of Onset  . Lung cancer Mother 21  . Cancer Mother   . Colon cancer Father 31  . Heart disease Father   . Alcohol abuse Father   . Cancer Father   . Esophageal cancer Brother 50  . Diabetes Brother   . Cancer Brother   . Kidney disease Brother   . Heart disease Brother   . Lung cancer Maternal Aunt   . Cancer Paternal Aunt        unk type  . Cancer Maternal Grandfather        unk type  . Drug abuse Daughter     Social History:  reports that he has quit smoking. His smoking use included cigarettes. He has a 17 pack-year smoking history. He has been exposed to tobacco smoke. He uses smokeless tobacco. He reports that he does not currently use alcohol after a past usage of about 12.0 standard drinks of alcohol per week. He reports current drug use. Drug: Hydrocodone .  Allergies:  Allergies  Allergen Reactions  . Glipizide Other (See Comments) and Palpitations    Shaky, feel bad Other reaction(s): Dizziness  . Ciprofloxacin  Itching    Caused itching and burning   . Cephalexin  Rash  . Duloxetine Anxiety and Nausea Only  . Dupixent [Dupilumab] Rash    Medications reviewed.    ROS Full ROS performed and is otherwise negative other than what is stated in HPI   BP 118/75   Pulse 87   Temp 98.5 F (36.9 C) (Oral)   Ht 6' 2 (1.88 m)   Wt 240 lb 12.8 oz (109.2 kg)   SpO2 97%   BMI 30.92 kg/m   Physical Exam  Vitals and nursing note reviewed. Exam conducted with a chaperone present.  Constitutional:      Appearance: Normal  appearance. He is not ill-appearing.  Pulmonary:     Effort: Pulmonary effort is normal. No respiratory distress.      No stridor.  Abdominal:     General: Abdomen is flat.     Palpations: Abdomen is soft. There is no mass.     Tenderness: There is no abdominal tenderness. There is no guarding or rebound.     Hernia: No hernia is present.     Comments: 3x3 mm superficial wound w good granulation tissue, no exposed mesh, no infection. No peritonitis  Skin:    General:  Skin is warm and dry.     Capillary Refill: Capillary refill takes less than 2 seconds.  Neurological:     General: No focal deficit present.     Mental Status: He is alert.  Psychiatric:        Mood and Affect: Mood normal.        Behavior: Behavior normal.        Thought Content: Thought content normal.        Judgment: Judgment normal.       Assessment/Plan: Resolved abd wall abscess that responded to I/D and antibiotics. No evidence of sepsis r systemic infection.   RTC 4 weeks I personally spent a total of 20 minutes in the care of the patient today including performing a medically appropriate exam/evaluation, counseling and educating, placing orders, referring and communicating with other health care professionals, documenting clinical information in the EHR, independently interpreting and reviewing images studies and coordinating care.   Laneta Luna, MD [facility] General Surgeon

## 2023-09-27 NOTE — Patient Instructions (Signed)
 Follow up here in 4 weeks.      Please call and ask to speak with a nurse if you develop questions or concerns.

## 2023-09-28 ENCOUNTER — Other Ambulatory Visit

## 2023-09-28 DIAGNOSIS — E1140 Type 2 diabetes mellitus with diabetic neuropathy, unspecified: Secondary | ICD-10-CM

## 2023-09-29 ENCOUNTER — Ambulatory Visit: Payer: Self-pay | Admitting: Nurse Practitioner

## 2023-09-29 LAB — COMPREHENSIVE METABOLIC PANEL WITH GFR
ALT: 40 IU/L (ref 0–44)
AST: 32 IU/L (ref 0–40)
Albumin: 4.7 g/dL (ref 3.8–4.9)
Alkaline Phosphatase: 64 IU/L (ref 44–121)
BUN/Creatinine Ratio: 11 (ref 10–24)
BUN: 15 mg/dL (ref 8–27)
Bilirubin Total: 0.5 mg/dL (ref 0.0–1.2)
CO2: 20 mmol/L (ref 20–29)
Calcium: 9.3 mg/dL (ref 8.6–10.2)
Chloride: 96 mmol/L (ref 96–106)
Creatinine, Ser: 1.32 mg/dL — ABNORMAL HIGH (ref 0.76–1.27)
Globulin, Total: 2.8 g/dL (ref 1.5–4.5)
Glucose: 103 mg/dL — ABNORMAL HIGH (ref 70–99)
Potassium: 4.5 mmol/L (ref 3.5–5.2)
Sodium: 136 mmol/L (ref 134–144)
Total Protein: 7.5 g/dL (ref 6.0–8.5)
eGFR: 62 mL/min/1.73 (ref >59)

## 2023-10-04 ENCOUNTER — Other Ambulatory Visit: Payer: Self-pay | Admitting: Emergency Medicine

## 2023-10-04 DIAGNOSIS — R911 Solitary pulmonary nodule: Secondary | ICD-10-CM

## 2023-10-05 ENCOUNTER — Encounter: Payer: Self-pay | Admitting: Dermatology

## 2023-10-05 ENCOUNTER — Ambulatory Visit: Admitting: Dermatology

## 2023-10-05 DIAGNOSIS — L821 Other seborrheic keratosis: Secondary | ICD-10-CM

## 2023-10-05 DIAGNOSIS — B351 Tinea unguium: Secondary | ICD-10-CM

## 2023-10-05 DIAGNOSIS — L700 Acne vulgaris: Secondary | ICD-10-CM

## 2023-10-05 DIAGNOSIS — D2371 Other benign neoplasm of skin of right lower limb, including hip: Secondary | ICD-10-CM

## 2023-10-05 DIAGNOSIS — L720 Epidermal cyst: Secondary | ICD-10-CM

## 2023-10-05 DIAGNOSIS — L814 Other melanin hyperpigmentation: Secondary | ICD-10-CM

## 2023-10-05 DIAGNOSIS — B354 Tinea corporis: Secondary | ICD-10-CM

## 2023-10-05 DIAGNOSIS — B353 Tinea pedis: Secondary | ICD-10-CM | POA: Diagnosis not present

## 2023-10-05 DIAGNOSIS — D239 Other benign neoplasm of skin, unspecified: Secondary | ICD-10-CM

## 2023-10-05 NOTE — Patient Instructions (Addendum)
 Treatment Plan: Restart tazarotene  0.1% cr at bedtime to face and chest as tolerated.   Topical retinoid medications like tretinoin /Retin-A , adapalene/Differin, tazarotene /Fabior , and Epiduo/Epiduo Forte can cause dryness and irritation when first started. Only apply a pea-sized amount to the entire affected area. Avoid applying it around the eyes, edges of mouth and creases at the nose. If you experience irritation, use a good moisturizer first and/or apply the medicine less often. If you are doing well with the medicine, you can increase how often you use it until you are applying every night. Be careful with sun protection while using this medication as it can make you sensitive to the sun. This medicine should not be used by pregnant women.   Due to recent changes in healthcare laws, you may see results of your pathology and/or laboratory studies on MyChart before the doctors have had a chance to review them. We understand that in some cases there may be results that are confusing or concerning to you. Please understand that not all results are received at the same time and often the doctors may need to interpret multiple results in order to provide you with the best plan of care or course of treatment. Therefore, we ask that you please give us  2 business days to thoroughly review all your results before contacting the office for clarification. Should we see a critical lab result, you will be contacted sooner.   If You Need Anything After Your Visit  If you have any questions or concerns for your doctor, please call our main line at [phone] and press option 4 to reach your doctor's medical assistant. If no one answers, please leave a voicemail as directed and we will return your call as soon as possible. Messages left after 4 pm will be answered the following business day.   You may also send us  a message via MyChart. We typically respond to MyChart messages within 1-2 business days.  For  prescription refills, please ask your pharmacy to contact our office. Our fax number is [phone].  If you have an urgent issue when the clinic is closed that cannot wait until the next business day, you can page your doctor at the number below.    Please note that while we do our best to be available for urgent issues outside of office hours, we are not available 24/7.   If you have an urgent issue and are unable to reach us , you may choose to seek medical care at your doctor's office, retail clinic, urgent care center, or emergency room.  If you have a medical emergency, please immediately call 911 or go to the emergency department.  Pager Numbers  - Dr. Hester: [phone]  - Dr. Jackquline: [phone]  - Dr. Claudene: [phone]   In the event of inclement weather, please call our main line at [phone] for an update on the status of any delays or closures.  Dermatology Medication Tips: Please keep the boxes that topical medications come in in order to help keep track of the instructions about where and how to use these. Pharmacies typically print the medication instructions only on the boxes and not directly on the medication tubes.   If your medication is too expensive, please contact our office at [phone] option 4 or send us  a message through MyChart.   We are unable to tell what your co-pay for medications will be in advance as this is different depending on your insurance coverage. However, we may be able to find  a substitute medication at lower cost or fill out paperwork to get insurance to cover a needed medication.   If a prior authorization is required to get your medication covered by your insurance company, please allow us  1-2 business days to complete this process.  Drug prices often vary depending on where the prescription is filled and some pharmacies may offer cheaper prices.  The website www.goodrx.com contains coupons for medications through different  pharmacies. The prices here do not account for what the cost may be with help from insurance (it may be cheaper with your insurance), but the website can give you the price if you did not use any insurance.  - You can print the associated coupon and take it with your prescription to the pharmacy.  - You may also stop by our office during regular business hours and pick up a GoodRx coupon card.  - If you need your prescription sent electronically to a different pharmacy, notify our office through [facility] or by phone at [phone] option 4.     Si Usted Necesita Algo Despus de Su Visita  Tambin puede enviarnos un mensaje a travs de Clinical cytogeneticist. Por lo general respondemos a los mensajes de MyChart en el transcurso de 1 a 2 das hbiles.  Para renovar recetas, por favor pida a su farmacia que se ponga en contacto con nuestra oficina. Randi lakes de fax es [place] [phone].  Si tiene un asunto urgente cuando la clnica est cerrada y que no puede esperar hasta el siguiente da hbil, puede llamar/localizar a su doctor(a) al nmero que aparece a continuacin.   Por favor, tenga en cuenta que aunque hacemos todo lo posible para estar disponibles para asuntos urgentes fuera del horario de [place], no estamos disponibles las 24 horas del da, los 7 [address]  [address] de la [place].   Si tiene un problema urgente y no puede comunicarse con nosotros, puede optar por buscar atencin mdica  en el consultorio de su doctor(a), en una clnica privada, en un centro de atencin urgente o en una sala de emergencias.  Si tiene Engineer, drilling, por favor llame inmediatamente al 911 o vaya a la sala de emergencias.  Nmeros de bper  - Dr. Hester: [phone]  - Dra. Jackquline: [phone]  - Dr. Claudene: [phone]   En caso de inclemencias del tiempo, por favor llame a landry capes principal al [phone] para una actualizacin sobre el [place] de cualquier retraso o cierre.  Consejos para la  medicacin en dermatologa: Por favor, guarde las cajas en las que vienen los medicamentos de uso tpico para ayudarle a seguir las instrucciones sobre dnde y cmo usarlos. Las farmacias generalmente imprimen las instrucciones del medicamento slo en las cajas y no directamente en los tubos del [place].   Si su medicamento es muy caro, por favor, pngase en contacto con landry rieger llamando al [phone] y presione la opcin 4 o envenos un mensaje a travs de Clinical cytogeneticist.   No podemos decirle cul ser su copago por los medicamentos por adelantado ya que esto es diferente dependiendo de la cobertura de su seguro. Sin embargo, es posible que podamos encontrar un medicamento sustituto a Audiological scientist un formulario para que el seguro cubra el medicamento que se considera necesario.   Si se requiere una autorizacin previa para que su compaa de seguros malta su medicamento, por favor permtanos de 1 a 2 das hbiles para completar este proceso.  Los precios de los medicamentos varan con frecuencia dependiendo  del lugar de dnde se surte la receta y alguna farmacias pueden ofrecer precios ms baratos.  El sitio web www.goodrx.com tiene cupones para medicamentos de Health and safety inspector. Los precios aqu no tienen en cuenta lo que podra costar con la ayuda del seguro (puede ser ms barato con su seguro), pero el sitio web puede darle el precio si no utiliz Tourist information centre manager.  - Puede imprimir el cupn correspondiente y llevarlo con su receta a la farmacia.  - Tambin puede pasar por nuestra oficina durante el horario de atencin regular y Education officer, museum una tarjeta de cupones de GoodRx.  - Si necesita que su receta se enve electrnicamente a una farmacia diferente, informe a nuestra oficina a travs de MyChart de  o por telfono llamando al [phone] y presione la opcin 4.

## 2023-10-05 NOTE — Progress Notes (Signed)
 Follow-Up Visit   Subjective  Darren Allen is a 58 y.o. male who presents for the following: 3 month acne and tinea follow up. Patient is using a benzoyl peroxide  with antibiotic prescribed by previous dermatologist. He has used tazarotene  but has stopped. Not using metronidazole , it was burning his skin. Face improved but not much improvement at chest.  Tinea pedis/unguium improved after taking 3 months of terbinafine  250 mg daily for 3 months.   Patient with a spot at hip he would like checked to be sure it's not skin cancer.   The following portions of the chart were reviewed this encounter and updated as appropriate: medications, allergies, medical history  Review of Systems:  No other skin or systemic complaints except as noted in HPI or Assessment and Plan.  Objective  Well appearing patient in no apparent distress; mood and affect are within normal limits.   A focused examination was performed of the following areas: Chest, face, feet and toenails  Relevant exam findings are noted in the Assessment and Plan.    Assessment & Plan   ACNE VULGARIS, milia, EICs Exam: Open comedones milia and EICs on cheeks zygomas chest  Chronic flared  Treatment Plan: Restart tazarotene  0.1% cr at bedtime to face and chest as tolerated.  Stop metronidazole , was burning  Topical retinoid medications like tretinoin /Retin-A , adapalene/Differin, tazarotene /Fabior , and Epiduo/Epiduo Forte can cause dryness and irritation when first started. Only apply a pea-sized amount to the entire affected area. Avoid applying it around the eyes, edges of mouth and creases at the nose. If you experience irritation, use a good moisturizer first and/or apply the medicine less often. If you are doing well with the medicine, you can increase how often you use it until you are applying every night. Be careful with sun protection while using this medication as it can make you sensitive to the sun.  Discussed  cosmetic dermatologist treatment options such as chemical peels and ablative laser   TINEA PEDIS/[place] with Tinea Unguium Exam: thickened discolored toenails. Proximal nails with clearance. Clearance of annular pink patches on legs and feet. Some inflamed papules on R distal foot that could be a recurrence  Chronic and persistent condition with duration or expected duration over one year. Condition is improving with treatment but not currently at goal.  Treatment Plan: Will recheck and if indicated consider doing another course of terbinafine .   SEBORRHEIC KERATOSIS vs LENTIGINES - at left lower back - Stuck-on, waxy, tan-brown papules and/or plaques  - Benign-appearing - Discussed benign etiology and prognosis. - Observe - Call for any changes  DERMATOFIBROMA - right posterior calf Exam: Firm pink/brown papulenodule with dimple sign. Treatment Plan: A dermatofibroma is a benign growth possibly related to trauma, such as an insect bite, cut from shaving, or inflamed acne-type bump.  Treatment options to remove include shave or excision with resulting scar and risk of recurrence.  Since benign-appearing and not bothersome, will observe for now.   Milia - tiny firm white papules - type of cyst - benign - sometimes these will clear with nightly OTC adapalene/Differin 0.1% gel or retinol. - may be extracted if symptomatic - observe   ACNE VULGARIS   EIC (EPIDERMAL INCLUSION CYST)   TINEA PEDIS OF BOTH FEET   ONYCHOMYCOSIS   DERMATOFIBROMA   MILIA   SEBORRHEIC KERATOSES   LENTIGINES    Return in about 3 months (around 01/05/2024) for acne, tinea, with Dr. Claudene.  LILLETTE Lonell Drones, RMA, am acting as scribe  for Boneta Sharps, MD .   Documentation: I have reviewed the above documentation for accuracy and completeness, and I agree with the above.  Boneta Sharps, MD

## 2023-10-07 ENCOUNTER — Other Ambulatory Visit: Payer: Self-pay | Admitting: Urology

## 2023-10-07 ENCOUNTER — Other Ambulatory Visit: Payer: Self-pay | Admitting: Nurse Practitioner

## 2023-10-07 DIAGNOSIS — E291 Testicular hypofunction: Secondary | ICD-10-CM

## 2023-10-08 NOTE — Telephone Encounter (Signed)
 Requested Prescriptions  Pending Prescriptions Disp Refills   metFORMIN  (GLUCOPHAGE -XR) 500 MG 24 hr tablet [Pharmacy Med Name: METFORMIN  HCL ER 500 MG TABLET] 360 tablet 0    Sig: Take 2 tablets (1,000 mg total) by mouth 2 (two) times daily with a meal.     Endocrinology:  Diabetes - Biguanides Failed - 10/08/2023  2:53 PM      Failed - Cr in normal range and within 360 days    Creatinine  Date Value Ref Range Status  03/13/2014 1.08 0.60 - 1.30 mg/dL Final   Creatinine, Ser  Date Value Ref Range Status  09/28/2023 1.32 (H) 0.76 - 1.27 mg/dL Final         Passed - HBA1C is between 0 and 7.9 and within 180 days    Hgb A1c MFr Bld  Date Value Ref Range Status  09/09/2023 7.0 (H) 4.8 - 5.6 % Final    Comment:             Prediabetes: 5.7 - 6.4          Diabetes: >6.4          Glycemic control for adults with diabetes: <7.0          Passed - eGFR in normal range and within 360 days    EGFR (African American)  Date Value Ref Range Status  03/13/2014 >60 >45mL/min Final  03/15/2012 >60  Final   GFR calc Af Amer  Date Value Ref Range Status  12/07/2019 59 (L) >60 mL/min Final   EGFR (Non-African Amer.)  Date Value Ref Range Status  03/13/2014 >60 >29mL/min Final    Comment:    eGFR values <75mL/min/1.73 m2 may be an indication of chronic kidney disease (CKD). Calculated eGFR, using the MRDR Study equation, is useful in  patients with stable renal function. The eGFR calculation will not be reliable in acutely ill patients when serum creatinine is changing rapidly. It is not useful in patients on dialysis. The eGFR calculation may not be applicable to patients at the low and high extremes of body sizes, pregnant women, and vegetarians.   03/15/2012 >60  Final    Comment:    eGFR values <69mL/min/1.73 m2 may be an indication of chronic kidney disease (CKD). Calculated eGFR is useful in patients with stable renal function. The eGFR calculation will not be reliable in  acutely ill patients when serum creatinine is changing rapidly. It is not useful in  patients on dialysis. The eGFR calculation may not be applicable to patients at the low and high extremes of body sizes, pregnant women, and vegetarians.    GFR, Estimated  Date Value Ref Range Status  08/31/2023 59 (L) >60 mL/min Final    Comment:    (NOTE) Calculated using the CKD-EPI Creatinine Equation (2021)    eGFR  Date Value Ref Range Status  09/28/2023 62 >59 mL/min/1.73 Final         Passed - B12 Level in normal range and within 720 days    Vitamin B-12  Date Value Ref Range Status  07/29/2023 700 180 - 914 pg/mL Final    Comment:    (NOTE) This assay is not validated for testing neonatal or myeloproliferative syndrome specimens for Vitamin B12 levels. Performed at [facility], [address] [place], [place] [address]          Passed - Valid encounter within last 6 months    Recent Outpatient Visits  4 weeks ago Type 2 diabetes mellitus with diabetic neuropathy, with long-term current use of insulin  (HCC)   [place] [facility] Melvin Pao, NP   4 months ago Chronic obstructive pulmonary disease, unspecified COPD type (HCC)   [place] [facility] Melvin Pao, NP   5 months ago Recurrent boils   [place] [facility] Melvin Pao, NP       Future Appointments             In 3 months Claudene Lehmann, MD [facility]   In 5 months McGowan, Clotilda DELENA RIGGERS [facility] [place]            Passed - CBC within normal limits and completed in the last 12 months    WBC  Date Value Ref Range Status  09/09/2023 6.9 3.4 - 10.8 x10E3/uL Final  08/31/2023 6.3 4.0 - 10.5 K/uL Final   RBC  Date Value Ref Range Status  09/09/2023 4.92 4.14 - 5.80 x10E6/uL Final  08/31/2023 4.55 4.22 - 5.81 MIL/uL Final   Hemoglobin  Date Value Ref Range Status  09/13/2023  12.1 (L) 13.0 - 17.7 g/dL Final   Hematocrit  Date Value Ref Range Status  09/13/2023 40.0 37.5 - 51.0 % Final   MCHC  Date Value Ref Range Status  09/09/2023 29.8 (L) 31.5 - 35.7 g/dL Final  93/82/7974 68.4 30.0 - 36.0 g/dL Final   [facility]  Date Value Ref Range Status  09/09/2023 23.8 (L) 26.6 - 33.0 pg Final  08/31/2023 24.0 (L) 26.0 - 34.0 pg Final   MCV  Date Value Ref Range Status  09/09/2023 80 79 - 97 fL Final  03/13/2014 83 80 - 100 fL Final   No results found for: PLTCOUNTKUC, LABPLAT, POCPLA RDW  Date Value Ref Range Status  09/09/2023 15.9 (H) 11.6 - 15.4 % Final  03/13/2014 15.2 (H) 11.5 - 14.5 % Final

## 2023-10-10 ENCOUNTER — Emergency Department
Admission: EM | Admit: 2023-10-10 | Discharge: 2023-10-10 | Disposition: A | Discharge status: Home / Self Care | Attending: Emergency Medicine | Admitting: Emergency Medicine

## 2023-10-10 ENCOUNTER — Other Ambulatory Visit: Payer: Self-pay

## 2023-10-10 ENCOUNTER — Emergency Department

## 2023-10-10 DIAGNOSIS — E119 Type 2 diabetes mellitus without complications: Secondary | ICD-10-CM | POA: Insufficient documentation

## 2023-10-10 DIAGNOSIS — L03311 Cellulitis of abdominal wall: Secondary | ICD-10-CM | POA: Insufficient documentation

## 2023-10-10 DIAGNOSIS — I10 Essential (primary) hypertension: Secondary | ICD-10-CM | POA: Insufficient documentation

## 2023-10-10 DIAGNOSIS — R109 Unspecified abdominal pain: Secondary | ICD-10-CM | POA: Diagnosis present

## 2023-10-10 LAB — URINALYSIS, ROUTINE W REFLEX MICROSCOPIC
Bacteria, UA: NONE SEEN
Bilirubin Urine: NEGATIVE
Glucose, UA: >=500 mg/dL — AB
Hgb urine dipstick: NEGATIVE
Ketones, ur: NEGATIVE mg/dL
Leukocytes,Ua: NEGATIVE
Nitrite: NEGATIVE
Protein, ur: NEGATIVE mg/dL
Specific Gravity, Urine: 1.017 (ref 1.005–1.030)
Squamous Epithelial / HPF: 0 /HPF (ref 0–5)
pH: 5 (ref 5.0–8.0)

## 2023-10-10 LAB — COMPREHENSIVE METABOLIC PANEL WITH GFR
ALT: 39 U/L (ref 0–44)
AST: 35 U/L (ref 15–41)
Albumin: 4.7 g/dL (ref 3.5–5.0)
Alkaline Phosphatase: 51 U/L (ref 38–126)
Anion gap: 13 (ref 5–15)
BUN: 19 mg/dL (ref 6–20)
CO2: 26 mmol/L (ref 22–32)
Calcium: 10.1 mg/dL (ref 8.9–10.3)
Chloride: 99 mmol/L (ref 98–111)
Creatinine, Ser: 1.48 mg/dL — ABNORMAL HIGH (ref 0.61–1.24)
GFR, Estimated: 54 mL/min — ABNORMAL LOW (ref >60)
Glucose, Bld: 192 mg/dL — ABNORMAL HIGH (ref 70–99)
Potassium: 3.6 mmol/L (ref 3.5–5.1)
Sodium: 138 mmol/L (ref 135–145)
Total Bilirubin: 0.9 mg/dL (ref 0.0–1.2)
Total Protein: 8.3 g/dL — ABNORMAL HIGH (ref 6.5–8.1)

## 2023-10-10 LAB — CBC
HCT: 39 % (ref 39.0–52.0)
Hemoglobin: 12.2 g/dL — ABNORMAL LOW (ref 13.0–17.0)
MCH: 23.4 pg — ABNORMAL LOW (ref 26.0–34.0)
MCHC: 31.3 g/dL (ref 30.0–36.0)
MCV: 74.9 fL — ABNORMAL LOW (ref 80.0–100.0)
Platelets: 289 K/uL (ref 150–400)
RBC: 5.21 MIL/uL (ref 4.22–5.81)
RDW: 16.5 % — ABNORMAL HIGH (ref 11.5–15.5)
WBC: 6.5 K/uL (ref 4.0–10.5)
nRBC: 0 % (ref 0.0–0.2)

## 2023-10-10 LAB — LACTIC ACID, PLASMA: Lactic Acid, Venous: 1.3 mmol/L (ref 0.5–1.9)

## 2023-10-10 LAB — LIPASE, BLOOD: Lipase: 48 U/L (ref 11–51)

## 2023-10-10 MED ORDER — PIPERACILLIN-TAZOBACTAM 3.375 G IVPB 30 MIN
3.3750 g | Freq: Once | INTRAVENOUS | Status: AC
  Administered 2023-10-10: 3.375 g via INTRAVENOUS
  Filled 2023-10-10 (×2): qty 50

## 2023-10-10 MED ORDER — MORPHINE SULFATE (PF) 4 MG/ML IV SOLN
4.0000 mg | Freq: Once | INTRAVENOUS | Status: AC
  Administered 2023-10-10: 4 mg via INTRAVENOUS
  Filled 2023-10-10: qty 1

## 2023-10-10 MED ORDER — IOHEXOL 300 MG/ML  SOLN
100.0000 mL | Freq: Once | INTRAMUSCULAR | Status: AC | PRN
  Administered 2023-10-10: 100 mL via INTRAVENOUS

## 2023-10-10 MED ORDER — METRONIDAZOLE 500 MG PO TABS: 500.0000 mg | ORAL_TABLET | Freq: Three times a day (TID) | ORAL | 0 refills | Status: DC

## 2023-10-10 MED ORDER — SULFAMETHOXAZOLE-TRIMETHOPRIM 800-160 MG PO TABS: 2.0000 | ORAL_TABLET | Freq: Two times a day (BID) | ORAL | 0 refills | Status: DC

## 2023-10-10 NOTE — ED Provider Notes (Signed)
 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  Patient has been following up with Dr. Jordis with surgery.  At his last visit, his wound had nearly completely healed.  However, over the last couple days he has noticed recurrent redness and today had drainage of bloody and purulent material from a spot on his abdominal wall.  No fevers.     Physical Exam   Triage Vital Signs: ED Triage Vitals  Encounter Vitals Group     BP 10/10/23 1838 (!) 145/91     Girls Systolic BP Percentile --      Girls Diastolic BP Percentile --      Boys Systolic BP Percentile --      Boys Diastolic BP Percentile --      Pulse Rate 10/10/23 1838 (!) 103     Resp 10/10/23 1838 20     Temp 10/10/23 1838 98.3 F (36.8 C)     Temp Source 10/10/23 1838 Oral     SpO2 10/10/23 1838 99 %     Weight 10/10/23 1837 237 lb (107.5 kg)     Height 10/10/23 1837 6' 2 (1.88 m)     Head Circumference --      Peak Flow --      Pain Score 10/10/23 1835 0     Pain Loc --      Pain Education --      Exclude from Growth Chart --     Most recent vital signs: Vitals:   10/10/23 1838 10/10/23 2130  BP: (!) 145/91 120/61  Pulse: (!) 103 84  Resp: 20 18  Temp: 98.3 F (36.8 C)   SpO2: 99% 96%     General: Awake, interactive  CV:  Regular rate, good peripheral perfusion.  Resp:  Unlabored respirations.  Abd:  Nondistended, soft, no significant tenderness to palpation.  There is a small area of erythema along the anterior abdominal wall with small amount of expressible purulent and bloody drainage Neuro:  Symmetric facial movement, fluid speech   ED  Results / Procedures / Treatments   Labs (all labs ordered are listed, but only abnormal results are displayed) Labs Reviewed  COMPREHENSIVE METABOLIC PANEL WITH GFR - Abnormal; Notable for the following components:      Result Value   Glucose, Bld 192 (*)    Creatinine, Ser 1.48 (*)    Total Protein 8.3 (*)    GFR, Estimated 54 (*)    All other components within normal limits  CBC - Abnormal; Notable for the following components:   Hemoglobin 12.2 (*)    MCV 74.9 (*)    MCH 23.4 (*)    RDW 16.5 (*)    All other components within normal limits  URINALYSIS, ROUTINE W REFLEX MICROSCOPIC - Abnormal; Notable for the following components:   Color, Urine YELLOW (*)    APPearance CLEAR (*)    Glucose, UA >=500 (*)    All other components within normal limits  LIPASE, BLOOD  LACTIC ACID, PLASMA     EKG EKG independently reviewed and interpreted by myself demonstrates:    RADIOLOGY Imaging independently reviewed and  interpreted by myself demonstrates:   Formal Radiology Read:  CT ABDOMEN PELVIS W CONTRAST Result Date: 10/10/2023 CLINICAL DATA:  Acute nonlocalized abdominal pain, recurrent abdominal wall abscesses EXAM: CT ABDOMEN AND PELVIS WITH CONTRAST TECHNIQUE: Multidetector CT imaging of the abdomen and pelvis was performed using the standard protocol following bolus administration of intravenous contrast. RADIATION DOSE REDUCTION: This exam was performed according to the departmental dose-optimization program which includes automated exposure control, adjustment of the mA and/or kV according to patient size and/or use of iterative reconstruction technique. CONTRAST:  OMNIPAQUE  IOHEXOL  300 MG/ML  SOLN COMPARISON:  09/02/2023 FINDINGS: Lower chest: No acute abnormality. Status post gastric fundoplication. Hepatobiliary: No focal liver abnormality is seen. No gallstones, gallbladder wall thickening, or biliary dilatation. Pancreas: Unremarkable Spleen: Unremarkable  Adrenals/Urinary Tract: The adrenal glands are unremarkable. The kidneys are normal in size and position. No hydronephrosis. No intrarenal calculi. A 4 mm nonobstructing calculus is seen within the terminal left ureter at the left ureterovesicular junction (79/2). No additional ureteral calculi. The bladder is otherwise unremarkable. Stomach/Bowel: Severe descending and sigmoid colonic diverticulosis. Stomach, small bowel, and large bowel are otherwise unremarkable. Appendix absent. No evidence of obstruction or focal inflammation. There is a relatively thick walled catheterization containing gas again identified within the anterior peritoneum (37/2) measuring 4.1 x 3.3 x 0.8 cm in dimension. No significant fluid component. There is mild infiltration of the overlying subcutaneous fat at this level likely reflecting local inflammatory change. The inflammatory changes, however, appear improved since prior examination slightly. No gross free intraperitoneal gas. No free intraperitoneal fluid. Vascular/Lymphatic: Aortic atherosclerosis. No enlarged abdominal or pelvic lymph nodes. Reproductive: Prostate is unremarkable. Other: No abdominal wall hernia Musculoskeletal: No acute or significant osseous findings. IMPRESSION: 1. Nonobstructing 4 mm calculus within the terminal left ureter at the left ureterovesicular junction. No associated hydronephrosis. 2. Persistent thick-walled gas containing collection within the anterior peritoneum, stable in size with slightly decreased overlying inflammatory change since prior examination. No significant fluid component. 3. Severe descending and sigmoid colonic diverticulosis. 4. Aortic atherosclerosis. Aortic Atherosclerosis (ICD10-I70.0). Electronically Signed   By: Dorethia Molt M.D.   On: 10/10/2023 20:11    PROCEDURES:  Critical Care performed: No  Procedures   MEDICATIONS ORDERED IN ED: Medications  morphine  (PF) 4 MG/ML injection 4 mg (4 mg Intravenous Given  10/10/23 1916)  iohexol  (OMNIPAQUE ) 300 MG/ML solution 100 mL (100 mLs Intravenous Contrast Given 10/10/23 1925)  morphine  (PF) 4 MG/ML injection 4 mg (4 mg Intravenous Given 10/10/23 2122)  piperacillin -tazobactam (ZOSYN ) IVPB 3.375 g (0 g Intravenous Stopped 10/10/23 2155)     IMPRESSION / MDM / ASSESSMENT AND PLAN / ED COURSE  I reviewed the triage vital signs and the nursing notes.  Differential diagnosis includes, but is not limited to, abdominal wall cellulitis, abscess, other acute intra-abdominal process  Patient's presentation is most consistent with acute presentation with potential threat to life or bodily function.  58 year old male presenting with recurrent abdominal wall drainage with complicated recent course including recurrent cellulitis and multiple I&D's of abscesses.  Will obtain CT to further evaluate.  Pain medication ordered.  CT demonstrated persistent gas cleaning collection in the anterior peritoneum without significant fluid component, stable compared to prior.  With report of resolution and now recurrence, will discuss with general surgery on-call.  Clinical Course as of 10/10/23 2303  [place] Oct 10, 2023  2022 Case discussed with Dr. Marinda.  He reviewed patient's records, recommends giving a dose of IV antibiotics in the ER,  but does feel that patient could be discharged on oral antibiotics with plans for clinic follow-up tomorrow morning.  Patient with significant concerns with this plan, can consider admission for IV antibiotics to hospitalist team. [NR]    Clinical Course User Index [NR] Levander Slate, MD   Discussed options with patient.  He is comfortable with the dose of IV antibiotics here and discharged on oral antibiotics with plans for outpatient follow-up in the clinic tomorrow.  Strict return precautions provided.  Patient discharged in stable condition.   FINAL CLINICAL IMPRESSION(S) / ED DIAGNOSES   Final diagnoses:  Abdominal wall cellulitis     Rx  / DC Orders   ED Discharge Orders          Ordered    sulfamethoxazole -trimethoprim  (BACTRIM  DS) 800-160 MG tablet  2 times daily        10/10/23 2303    metroNIDAZOLE  (FLAGYL ) 500 MG tablet  3 times daily        10/10/23 2303             Note:  This document was prepared using Dragon voice recognition software and may include unintentional dictation errors.   Levander Slate, MD 10/10/23 [phone]

## 2023-10-10 NOTE — ED Notes (Signed)
 Patient transported to CT

## 2023-10-10 NOTE — Discharge Instructions (Addendum)
 You were seen in the ER for your recurrent skin infection.  You received a dose of antibiotics here.  I sent a prescription for additional antibiotics to your pharmacy.  You should be contacted by Dr. Dolph office tomorrow to arrange follow-up for tomorrow.  If you have not heard from them by noon, please call the office to arrange your appointment.  Return to the ER for new or worsening symptoms.

## 2023-10-10 NOTE — ED Triage Notes (Signed)
 Pt to ED for midline abdominal pain since April. Hx hernia surgery to same area in January, then 2 surgeries (April, May) for abscesses related to the surgery. Incision above umbilicus appears red and has had purulent drainage. Area is sore to the touch, no pain if not being touched.   States also BG has been high, was 400 yesterday. Has been waking up with BG 150-200. Has been taking extra insulin  to correct.

## 2023-10-11 ENCOUNTER — Encounter: Payer: Self-pay | Admitting: Nurse Practitioner

## 2023-10-11 ENCOUNTER — Ambulatory Visit (INDEPENDENT_AMBULATORY_CARE_PROVIDER_SITE_OTHER): Admitting: Surgery

## 2023-10-11 ENCOUNTER — Telehealth: Payer: Self-pay | Admitting: Surgery

## 2023-10-11 ENCOUNTER — Encounter: Payer: Self-pay | Admitting: Surgery

## 2023-10-11 VITALS — BP 143/87 | HR 105 | Temp 98.5°F | Ht 74.0 in | Wt 239.0 lb

## 2023-10-11 DIAGNOSIS — L02211 Cutaneous abscess of abdominal wall: Secondary | ICD-10-CM

## 2023-10-11 DIAGNOSIS — K432 Incisional hernia without obstruction or gangrene: Secondary | ICD-10-CM

## 2023-10-11 NOTE — Patient Instructions (Addendum)
 You have requested to have a Ventral Hernia Repair. This will be done by Dr Dana Duncan at [facility]. Please see your (BLUE) Pre-care sheet for more information. Our surgery scheduler will call you to look at surgery dates and to go over surgery information.   If you are on any injectable weight loss medication, you will need to stop taking your GLP-1 injectable (weight loss) medications 8 days before your surgery to avoid any complications with anesthesia.   You will need to arrange to be out of work for approximately 1-2 weeks and then you may return with a lifting restriction for 4 more weeks. If you have FMLA or Disability paperwork that needs to be filled out, please have your company fax your paperwork to [phone] or you may drop this by either office. This paperwork will be filled out within 3 days after your surgery has been completed.     Ventral Hernia A ventral hernia (also called an incisional hernia) is a hernia that occurs at the site of a previous surgical cut (incision) in the abdomen. The abdominal wall spans from your lower chest down to your pelvis. If the abdominal wall is weakened from a surgical incision, a hernia can occur. A hernia is a bulge of bowel or muscle tissue pushing out on the weakened part of the abdominal wall. Ventral hernias can get bigger from straining or lifting. Obese and older people are at higher risk for a ventral hernia. People who develop infections after surgery or require repeat incisions at the same site on the abdomen are also at increased risk. CAUSES  A ventral hernia occurs because of weakness in the abdominal wall at an incision site.  SYMPTOMS  Common symptoms include: A visible bulge or lump on the abdominal wall. Pain or tenderness around the lump. Increased discomfort if you cough or make a sudden movement. If the hernia has blocked part of the intestine, a serious complication can occur (incarcerated or strangulated hernia). This can become a  problem that requires emergency surgery because the blood flow to the blocked intestine may be cut off. Symptoms may include: Feeling sick to your stomach (nauseous). Throwing up (vomiting). Stomach swelling (distention) or bloating. Fever. Rapid heartbeat. DIAGNOSIS  Your health care provider will take a medical history and perform a physical exam. Various tests may be ordered, such as: Blood tests. Urine tests. Ultrasonography. X-rays. Computed tomography (CT). TREATMENT  Watchful waiting may be all that is needed for a smaller hernia that does not cause symptoms. Your health care provider may recommend the use of a supportive belt (truss) that helps to keep the abdominal wall intact. For larger hernias or those that cause pain, surgery to repair the hernia is usually recommended. If a hernia becomes strangulated, emergency surgery needs to be done right away. HOME CARE INSTRUCTIONS Avoid putting pressure or strain on the abdominal area. Avoid heavy lifting. Use good body positioning for physical tasks. Ask your health care provider about proper body positioning. Use a supportive belt as directed by your health care provider. Maintain a healthy weight. Eat foods that are high in fiber, such as whole grains, fruits, and vegetables. Fiber helps prevent difficult bowel movements (constipation). Drink enough fluids to keep your urine clear or pale yellow. Follow up with your health care provider as directed. SEEK MEDICAL CARE IF:  Your hernia seems to be getting larger or more painful. SEEK IMMEDIATE MEDICAL CARE IF:  You have abdominal pain that is sudden and sharp. Your pain  becomes severe. You have repeated vomiting. You are sweating a lot. You notice a rapid heartbeat. You develop a fever. MAKE SURE YOU:  Understand these instructions. Will watch your condition. Will get help right away if you are not doing well or get worse.     Open Ventral Hernia Repair Open ventral  hernia repair is a surgery to fix a ventral hernia. A ventral hernia,  is a bulge of body tissue or intestines that pushes through the front part of the abdomen. This can happen if the connective tissue covering the muscles over the abdomen has a weak spot or is torn because of a surgical cut (incision) from a previous surgery. A ventral hernia repair is often done soon after diagnosis to stop the hernia from getting bigger, becoming uncomfortable, or becoming an emergency. This surgery usually takes about 2 hours, but the time can vary greatly.  LET [facility] CARE PROVIDER KNOW ABOUT: Any allergies you have. All medicines you are taking, including steroids, vitamins, herbs, eye drops, creams, and over-the-counter medicines. Previous problems you or members of your family have had with the use of anesthetics. Any blood disorders you have. Previous surgeries you have had. Medical conditions you have.  RISKS AND COMPLICATIONS  Generally, Open ventral hernia repair is a safe procedure. However, as with any surgical procedure, problems can occur. Possible problems include: Bleeding. Trouble passing urine or having a bowel movement after the surgery. Infection. Pneumonia. Blood clots. Pain in the area of the hernia. A bulge in the area of the hernia that may be caused by a collection of fluid. Injury to intestines or other structures in the abdomen. Return of the hernia after surgery.  BEFORE THE PROCEDURE  You may need to have blood tests, urine tests, a chest X-ray, or an electrocardiogram done before the day of the surgery. Ask your health care provider about changing or stopping your regular medicines. This is especially important if you are taking diabetes medicines or blood thinners. You may need to wash with a special type of germ-killing soap. Do not eat or drink anything after midnight the night before the procedure or as directed by your health care provider. Make plans to have  someone drive you home after the procedure.  PROCEDURE  Small monitors will be put on your body. They are used to check your heart, blood pressure, and oxygen  level. An IV access tube will be put into a vein in your hand or arm. Fluids and medicine will flow directly into your body through the IV tube. You will be given medicine that makes you go to sleep (general anesthetic). Your abdomen will be cleaned with a special soap to kill any germs on your skin. Once you are asleep, a moderate - large size incision will be made in your abdomen. The size of incision depends on how large your hernia is. Your surgeon puts the tissue or intestines that formed the hernia back in place. A screen-like patch (mesh) is used to close the hernia. This helps make the area stronger. Stitches, tacks, or staples are used to keep the mesh in place. Medicine and a bandage (dressing) or skin glue will be put over the incision.  AFTER THE PROCEDURE  You will stay in a recovery area until the anesthetic wears off. Your blood pressure and pulse will be checked often. You may be able to go home the same day or may need to stay in the hospital for 1-2 days after surgery. Your  surgeon will decide when you can go home depending upon your recovery. You may feel some pain. You will be given medicine for pain. You will be urged to do breathing exercises that involve taking deep breaths. This helps prevent a lung infection after a surgery. You may have to wear compression stockings while you are in the hospital. These stockings help keep blood clots from forming in your legs.   This information is not intended to replace advice given to you by your health care provider. Make sure you discuss any questions you have with your health care provider.   Document Released: 02/17/2012 Document Revised: 03/07/2013 Document Reviewed: 02/17/2012 Elsevier Interactive Patient Education [employer]

## 2023-10-11 NOTE — Telephone Encounter (Signed)
 Patient has been advised of Pre-Admission date/time, and Surgery date at [facility].  Surgery Date: 10/13/23 Preadmission Testing Date: 10/13/23 (2 hrs early, patient to call the number below for arrival time)   Patient has been made aware to call [phone], between 1-3:00pm the day before surgery, to find out what time to arrive for surgery.

## 2023-10-12 MED ORDER — SODIUM CHLORIDE 0.9 % IV SOLN: INTRAVENOUS | Status: DC

## 2023-10-12 MED ORDER — CHLORHEXIDINE GLUCONATE CLOTH 2 % EX PADS
6.0000 | MEDICATED_PAD | Freq: Once | CUTANEOUS | Status: AC
  Administered 2023-10-13: 6 via TOPICAL

## 2023-10-12 MED ORDER — GABAPENTIN 300 MG PO CAPS
300.0000 mg | ORAL_CAPSULE | ORAL | Status: AC
  Administered 2023-10-13: 300 mg via ORAL

## 2023-10-12 MED ORDER — ORAL CARE MOUTH RINSE: 15.0000 mL | Freq: Once | OROMUCOSAL | Status: AC

## 2023-10-12 MED ORDER — CHLORHEXIDINE GLUCONATE CLOTH 2 % EX PADS: 6.0000 | MEDICATED_PAD | Freq: Once | CUTANEOUS | Status: DC

## 2023-10-12 MED ORDER — SODIUM CHLORIDE 0.9 % IV SOLN
1.0000 g | INTRAVENOUS | Status: AC
  Administered 2023-10-13: 1 g via INTRAVENOUS
  Filled 2023-10-12: qty 1

## 2023-10-12 MED ORDER — CHLORHEXIDINE GLUCONATE 0.12 % MT SOLN
15.0000 mL | Freq: Once | OROMUCOSAL | Status: AC
  Administered 2023-10-13: 15 mL via OROMUCOSAL

## 2023-10-12 MED ORDER — ACETAMINOPHEN 500 MG PO TABS
1000.0000 mg | ORAL_TABLET | ORAL | Status: AC
  Administered 2023-10-13: 1000 mg via ORAL

## 2023-10-12 MED ORDER — CELECOXIB 200 MG PO CAPS
200.0000 mg | ORAL_CAPSULE | ORAL | Status: AC
  Administered 2023-10-13: 200 mg via ORAL

## 2023-10-12 NOTE — Progress Notes (Signed)
 Outpatient Surgical Follow Up   Darren Allen is an 58 y.o. male.   Chief Complaint  Patient presents with   Routine Post Op    I&D abdominal wall abscess 09/03/23    YEP:Mpryjmid is  s/p ventral hernia repair on 04/08/2023 w Ventralex, he developed an abscess and was I/D 6/20. Cultures grew Klebsiella and pseudomonas, he completed a/bs. He is doing ok, taking po, no pain, some intermittent serous drainage , no fevers or chills. HE was doing well up until this weekend when had his wound open and significant drainage. No fevers or chills but does report pain, sharp moderate and intermittent  CT pers reviewed showing abscess and persistent air fludi level, no evidence of necrotizing infection   Past Medical History:  Diagnosis Date   Allergy    Anxiety    Asthma    Chronic kidney disease    Chronic lower back pain    a.) followed by pain management; on COT   COPD (chronic obstructive pulmonary disease) (HCC)    Coronary artery disease    a.) cCTA 06/30/2021: Ca2+ = 216 (84th %ile; 25049% pRCA and pLAD))   DDD (degenerative disc disease), lumbosacral    Depression    Diabetic peripheral neuropathy (HCC)    Diastolic dysfunction 12/27/2020   a.) TTE 12/27/2020: EF >55%, no RWMAs, G1DD, norm RVSF, triv MR/TR?PR   Diverticulosis    Emphysema of lung (HCC)    Erectile dysfunction    a.) on PDE5i (tadalafil )   GERD (gastroesophageal reflux disease)    Hepatic steatosis    History of kidney stones    Hyperlipidemia    Hypertension    Hypogonadism male    a.) on exogenous TRT (depotestosterone cypionate)   IDA (iron deficiency anemia)    Long term current use of opiate analgesic    a.) followed by pain management; naloxone  Rx available   OSA on CPAP    Osteoporosis    Paraesophageal hernia    a.) s/p robotic assisted repair 01/2022   PVD (peripheral vascular disease) (HCC)    Sleep apnea    T2DM (type 2 diabetes mellitus) (HCC)    Umbilical hernia    a.) s/p repair  01/20/2022   Ventral hernia     Past Surgical History:  Procedure Laterality Date   ABDOMINAL HYSTERECTOMY     APPENDECTOMY     BRONCHOSCOPY  2016   CIRCUMCISION     COLONOSCOPY WITH PROPOFOL  N/A 12/31/2021   Procedure: COLONOSCOPY WITH PROPOFOL ;  Surgeon: Therisa Bi, MD;  Location: [facility] ENDOSCOPY;  Service: Gastroenterology;  Laterality: N/A;   ESOPHAGOGASTRODUODENOSCOPY N/A 12/31/2021   Procedure: ESOPHAGOGASTRODUODENOSCOPY (EGD);  Surgeon: Therisa Bi, MD;  Location: [facility] ENDOSCOPY;  Service: Gastroenterology;  Laterality: N/A;   HERNIA REPAIR  07/2023   INCISION AND DRAINAGE PERIRECTAL ABSCESS N/A 02/04/2015   Procedure: IRRIGATION AND DEBRIDEMENT PERIRECTAL ABSCESS;  Surgeon: Lonni Brands, MD;  Location: [facility] ORS;  Service: General;  Laterality: N/A;   INSERTION OF MESH  01/20/2022   Procedure: INSERTION OF MESH;  Surgeon: Jordis Laneta FALCON, MD;  Location: [facility] ORS;  Service: General;;   INSERTION OF MESH N/A 04/08/2023   Procedure: INSERTION OF MESH;  Surgeon: Jordis Laneta FALCON, MD;  Location: [facility] ORS;  Service: General;  Laterality: N/A;   KIDNEY STONE SURGERY Right    LEFT HEART CATH AND CORONARY ANGIOGRAPHY Left 08/16/2023   Procedure: LEFT HEART CATH AND CORONARY ANGIOGRAPHY;  Surgeon: Florencio Cara BIRCH, MD;  Location: [facility] INVASIVE CV  LAB;  Service: Cardiovascular;  Laterality: Left;   lung mass removal N/A    MUSCLE BIOPSY     ORIF ANKLE FRACTURE Left 03/23/2017   Procedure: OPEN REDUCTION INTERNAL FIXATION (ORIF) ANKLE FRACTURE;  Surgeon: Tobie Priest, MD;  Location: [facility] ORS;  Service: Orthopedics;  Laterality: Left;   RECTAL EXAM UNDER ANESTHESIA  02/04/2015   Procedure: RECTAL EXAM UNDER ANESTHESIA;  Surgeon: Lonni Brands, MD;  Location: [facility] ORS;  Service: General;;   SYNDESMOSIS REPAIR Left 03/23/2017   Procedure: SYNDESMOSIS REPAIR;  Surgeon: Tobie Priest, MD;  Location: [facility] ORS;  Service: Orthopedics;  Laterality: Left;   UMBILICAL HERNIA REPAIR   01/20/2022   Procedure: HERNIA REPAIR UMBILICAL ADULT;  Surgeon: Jordis Laneta FALCON, MD;  Location: [facility] ORS;  Service: General;;   VASECTOMY     VENTRAL HERNIA REPAIR N/A 04/08/2023   Procedure: HERNIA REPAIR VENTRAL ADULT, open;  Surgeon: Jordis Laneta FALCON, MD;  Location: [facility] ORS;  Service: General;  Laterality: N/A;   XI ROBOTIC ASSISTED PARAESOPHAGEAL HERNIA REPAIR N/A 01/20/2022   Procedure: XI ROBOTIC ASSISTED PARAESOPHAGEAL HERNIA REPAIR, CONVERTED TO OPEN, RNFA to assist;  Surgeon: Jordis Laneta FALCON, MD;  Location: [facility] ORS;  Service: General;  Laterality: N/A;    Family History  Problem Relation Age of Onset   Lung cancer Mother 73   Cancer Mother    Colon cancer Father 7   Heart disease Father    Alcohol abuse Father    Cancer Father    Esophageal cancer Brother 86   Diabetes Brother    Cancer Brother    Kidney disease Brother    Heart disease Brother    Lung cancer Maternal Aunt    Cancer Paternal Aunt        unk type   Cancer Maternal Grandfather        unk type   Drug abuse Daughter     Social History:  reports that he has quit smoking. His smoking use included cigarettes. He has a 17 pack-year smoking history. He has been exposed to tobacco smoke. He uses smokeless tobacco. He reports that he does not currently use alcohol after a past usage of about 12.0 standard drinks of alcohol per week. He reports current drug use. Drug: Hydrocodone .  Allergies:  Allergies  Allergen Reactions   Glipizide Other (See Comments) and Palpitations    Shaky, feel bad Other reaction(s): Dizziness   Ciprofloxacin  Itching    Caused itching and burning    Cephalexin  Rash   Duloxetine Anxiety and Nausea Only   Dupixent [Dupilumab] Rash    Medications reviewed.    ROS Full ROS performed and is otherwise negative other than what is stated in HPI   BP (!) 143/87   Pulse (!) 105   Temp 98.5 F (36.9 C) (Oral)   Ht 6' 2 (1.88 m)   Wt 239 lb (108.4 kg)   SpO2 97%   BMI 30.69  kg/m   Physical Exam  Vitals and nursing note reviewed. Exam conducted with a chaperone present.  Constitutional:      Appearance: Normal appearance. He is not ill-appearing.  Pulmonary:     Effort: Pulmonary effort is normal. No respiratory distress.      No stridor.  Abdominal:     General: Abdomen is flat.     Palpations: Abdomen is soft. There is no mass.     Tenderness: There is no abdominal tenderness. There is no guarding or rebound.     Hernia: No  hernia is present.     Comments: 3x3 mm superficial open wound w some serous drainage, tender to palpation, no mesh exposed. No peritonitis  Skin:    General: Skin is warm and dry.     Capillary Refill: Capillary refill takes less than 2 seconds.  Neurological:     General: No focal deficit present.     Mental Status: He is alert.  Psychiatric:        Mood and Affect: Mood normal.        Behavior: Behavior normal.        Thought Content: Thought content normal.        Judgment: Judgment normal.     Results for orders placed or performed during the hospital encounter of 10/10/23 (from the past 48 hours)  Lipase, blood     Status: None   Collection Time: 10/10/23  6:40 PM  Result Value Ref Range   Lipase 48 11 - 51 U/L    Comment: Performed at [facility], [address] [place], [place] [address]  Comprehensive metabolic panel     Status: Abnormal   Collection Time: 10/10/23  6:40 PM  Result Value Ref Range   Sodium 138 135 - 145 mmol/L   Potassium 3.6 3.5 - 5.1 mmol/L   Chloride 99 98 - 111 mmol/L   CO2 26 22 - 32 mmol/L   Glucose, Bld 192 (H) 70 - 99 mg/dL    Comment: Glucose reference range applies only to samples taken after fasting for at least 8 hours.   BUN 19 6 - 20 mg/dL   Creatinine, Ser 8.51 (H) 0.61 - 1.24 mg/dL   Calcium  10.1 8.9 - 10.3 mg/dL   Total Protein 8.3 (H) 6.5 - 8.1 g/dL   Albumin  4.7 3.5 - 5.0 g/dL   AST 35 15 - 41 U/L   ALT 39 0 - 44 U/L   Alkaline Phosphatase 51 38 - 126 U/L    Total Bilirubin 0.9 0.0 - 1.2 mg/dL   GFR, Estimated 54 (L) >60 mL/min    Comment: (NOTE) Calculated using the CKD-EPI Creatinine Equation (2021)    Anion gap 13 5 - 15    Comment: Performed at [facility], [address] [place], [place] [address]  CBC     Status: Abnormal   Collection Time: 10/10/23  6:40 PM  Result Value Ref Range   WBC 6.5 4.0 - 10.5 K/uL   RBC 5.21 4.22 - 5.81 MIL/uL   Hemoglobin 12.2 (L) 13.0 - 17.0 g/dL   HCT 60.9 60.9 - 47.9 %   MCV 74.9 (L) 80.0 - 100.0 fL   MCH 23.4 (L) 26.0 - 34.0 pg   MCHC 31.3 30.0 - 36.0 g/dL   RDW 83.4 (H) 88.4 - 84.4 %   Platelets 289 150 - 400 K/uL   nRBC 0.0 0.0 - 0.2 %    Comment: Performed at [facility], [address] [place], [place] [address]  Urinalysis, Routine w reflex microscopic -Urine, Clean Catch     Status: Abnormal   Collection Time: 10/10/23  6:40 PM  Result Value Ref Range   Color, Urine YELLOW (A) YELLOW   APPearance CLEAR (A) CLEAR   Specific Gravity, Urine 1.017 1.005 - 1.030   pH 5.0 5.0 - 8.0   Glucose, UA >=500 (A) NEGATIVE mg/dL   Hgb urine dipstick NEGATIVE NEGATIVE   Bilirubin Urine NEGATIVE NEGATIVE   Ketones, ur NEGATIVE NEGATIVE mg/dL   Protein, ur  NEGATIVE NEGATIVE mg/dL   Nitrite NEGATIVE NEGATIVE   Leukocytes,Ua NEGATIVE NEGATIVE   RBC / HPF 0-5 0 - 5 RBC/hpf   WBC, UA 0-5 0 - 5 WBC/hpf   Bacteria, UA NONE SEEN NONE SEEN   Squamous Epithelial / HPF 0 0 - 5 /HPF    Comment: Performed at [facility], [address], [place], [place] [address]  Lactic acid, plasma     Status: None   Collection Time: 10/10/23  6:40 PM  Result Value Ref Range   Lactic Acid, Venous 1.3 0.5 - 1.9 mmol/L    Comment: Performed at [facility], [address] [place], [place] [address]   CT ABDOMEN PELVIS W CONTRAST Result Date: 10/10/2023 CLINICAL DATA:  Acute nonlocalized abdominal pain, recurrent abdominal wall abscesses EXAM: CT ABDOMEN AND PELVIS WITH CONTRAST  TECHNIQUE: Multidetector CT imaging of the abdomen and pelvis was performed using the standard protocol following bolus administration of intravenous contrast. RADIATION DOSE REDUCTION: This exam was performed according to the departmental dose-optimization program which includes automated exposure control, adjustment of the mA and/or kV according to patient size and/or use of iterative reconstruction technique. CONTRAST:  OMNIPAQUE  IOHEXOL  300 MG/ML  SOLN COMPARISON:  09/02/2023 FINDINGS: Lower chest: No acute abnormality. Status post gastric fundoplication. Hepatobiliary: No focal liver abnormality is seen. No gallstones, gallbladder wall thickening, or biliary dilatation. Pancreas: Unremarkable Spleen: Unremarkable Adrenals/Urinary Tract: The adrenal glands are unremarkable. The kidneys are normal in size and position. No hydronephrosis. No intrarenal calculi. A 4 mm nonobstructing calculus is seen within the terminal left ureter at the left ureterovesicular junction (79/2). No additional ureteral calculi. The bladder is otherwise unremarkable. Stomach/Bowel: Severe descending and sigmoid colonic diverticulosis. Stomach, small bowel, and large bowel are otherwise unremarkable. Appendix absent. No evidence of obstruction or focal inflammation. There is a relatively thick walled catheterization containing gas again identified within the anterior peritoneum (37/2) measuring 4.1 x 3.3 x 0.8 cm in dimension. No significant fluid component. There is mild infiltration of the overlying subcutaneous fat at this level likely reflecting local inflammatory change. The inflammatory changes, however, appear improved since prior examination slightly. No gross free intraperitoneal gas. No free intraperitoneal fluid. Vascular/Lymphatic: Aortic atherosclerosis. No enlarged abdominal or pelvic lymph nodes. Reproductive: Prostate is unremarkable. Other: No abdominal wall hernia Musculoskeletal: No acute or significant osseous  findings. IMPRESSION: 1. Nonobstructing 4 mm calculus within the terminal left ureter at the left ureterovesicular junction. No associated hydronephrosis. 2. Persistent thick-walled gas containing collection within the anterior peritoneum, stable in size with slightly decreased overlying inflammatory change since prior examination. No significant fluid component. 3. Severe descending and sigmoid colonic diverticulosis. 4. Aortic atherosclerosis. Aortic Atherosclerosis (ICD10-I70.0). Electronically Signed   By: Dorethia Molt M.D.   On: 10/10/2023 20:11    Assessment/Plan: 58 year old with chronic wound infection.  I am concerned that he will continue to drain and may develop complications.  Sugars already going up which is a sign of uncontrolled infection.  I do think that we have failed conservative treatment and the next step will be excision of the mesh with hernia repair with  phasix ofr other absorbable mesh.  Procedure discussed with the patient in detail.  He is in agreement.  Encouraged him to take antibiotics and we will go ahead and schedule him for surgery within the next 48 hours.  Procedure discussed with him in detail.  Risk, benefits and possible complications including but not limited to, bleeding, infection, bowel injuries.  He  understands and wished to proceed  I personally spent a total of 30 minutes in the care of the patient today including performing a medically appropriate exam/evaluation, counseling and educating, placing orders, referring and communicating with other health care professionals, documenting clinical information in the EHR, independently interpreting and reviewing images studies and coordinating care.   Laneta Luna, MD [facility] General Surgeon

## 2023-10-12 NOTE — H&P (View-Only) (Signed)
 Outpatient Surgical Follow Up   Darren Allen is an 58 y.o. male.   Chief Complaint  Patient presents with   Routine Post Op    I&D abdominal wall abscess 09/03/23    YEP:Mpryjmid is  s/p ventral hernia repair on 04/08/2023 w Ventralex, he developed an abscess and was I/D 6/20. Cultures grew Klebsiella and pseudomonas, he completed a/bs. He is doing ok, taking po, no pain, some intermittent serous drainage , no fevers or chills. HE was doing well up until this weekend when had his wound open and significant drainage. No fevers or chills but does report pain, sharp moderate and intermittent  CT pers reviewed showing abscess and persistent air fludi level, no evidence of necrotizing infection   Past Medical History:  Diagnosis Date   Allergy    Anxiety    Asthma    Chronic kidney disease    Chronic lower back pain    a.) followed by pain management; on COT   COPD (chronic obstructive pulmonary disease) (HCC)    Coronary artery disease    a.) cCTA 06/30/2021: Ca2+ = 216 (84th %ile; 25049% pRCA and pLAD))   DDD (degenerative disc disease), lumbosacral    Depression    Diabetic peripheral neuropathy (HCC)    Diastolic dysfunction 12/27/2020   a.) TTE 12/27/2020: EF >55%, no RWMAs, G1DD, norm RVSF, triv MR/TR?PR   Diverticulosis    Emphysema of lung (HCC)    Erectile dysfunction    a.) on PDE5i (tadalafil )   GERD (gastroesophageal reflux disease)    Hepatic steatosis    History of kidney stones    Hyperlipidemia    Hypertension    Hypogonadism male    a.) on exogenous TRT (depotestosterone cypionate)   IDA (iron deficiency anemia)    Long term current use of opiate analgesic    a.) followed by pain management; naloxone  Rx available   OSA on CPAP    Osteoporosis    Paraesophageal hernia    a.) s/p robotic assisted repair 01/2022   PVD (peripheral vascular disease) (HCC)    Sleep apnea    T2DM (type 2 diabetes mellitus) (HCC)    Umbilical hernia    a.) s/p repair  01/20/2022   Ventral hernia     Past Surgical History:  Procedure Laterality Date   ABDOMINAL HYSTERECTOMY     APPENDECTOMY     BRONCHOSCOPY  2016   CIRCUMCISION     COLONOSCOPY WITH PROPOFOL  N/A 12/31/2021   Procedure: COLONOSCOPY WITH PROPOFOL ;  Surgeon: Therisa Bi, MD;  Location: [facility] ENDOSCOPY;  Service: Gastroenterology;  Laterality: N/A;   ESOPHAGOGASTRODUODENOSCOPY N/A 12/31/2021   Procedure: ESOPHAGOGASTRODUODENOSCOPY (EGD);  Surgeon: Therisa Bi, MD;  Location: [facility] ENDOSCOPY;  Service: Gastroenterology;  Laterality: N/A;   HERNIA REPAIR  07/2023   INCISION AND DRAINAGE PERIRECTAL ABSCESS N/A 02/04/2015   Procedure: IRRIGATION AND DEBRIDEMENT PERIRECTAL ABSCESS;  Surgeon: Lonni Brands, MD;  Location: [facility] ORS;  Service: General;  Laterality: N/A;   INSERTION OF MESH  01/20/2022   Procedure: INSERTION OF MESH;  Surgeon: Jordis Laneta FALCON, MD;  Location: [facility] ORS;  Service: General;;   INSERTION OF MESH N/A 04/08/2023   Procedure: INSERTION OF MESH;  Surgeon: Jordis Laneta FALCON, MD;  Location: [facility] ORS;  Service: General;  Laterality: N/A;   KIDNEY STONE SURGERY Right    LEFT HEART CATH AND CORONARY ANGIOGRAPHY Left 08/16/2023   Procedure: LEFT HEART CATH AND CORONARY ANGIOGRAPHY;  Surgeon: Florencio Cara BIRCH, MD;  Location: [facility] INVASIVE CV  LAB;  Service: Cardiovascular;  Laterality: Left;   lung mass removal N/A    MUSCLE BIOPSY     ORIF ANKLE FRACTURE Left 03/23/2017   Procedure: OPEN REDUCTION INTERNAL FIXATION (ORIF) ANKLE FRACTURE;  Surgeon: Tobie Priest, MD;  Location: [facility] ORS;  Service: Orthopedics;  Laterality: Left;   RECTAL EXAM UNDER ANESTHESIA  02/04/2015   Procedure: RECTAL EXAM UNDER ANESTHESIA;  Surgeon: Lonni Brands, MD;  Location: [facility] ORS;  Service: General;;   SYNDESMOSIS REPAIR Left 03/23/2017   Procedure: SYNDESMOSIS REPAIR;  Surgeon: Tobie Priest, MD;  Location: [facility] ORS;  Service: Orthopedics;  Laterality: Left;   UMBILICAL HERNIA REPAIR   01/20/2022   Procedure: HERNIA REPAIR UMBILICAL ADULT;  Surgeon: Jordis Laneta FALCON, MD;  Location: [facility] ORS;  Service: General;;   VASECTOMY     VENTRAL HERNIA REPAIR N/A 04/08/2023   Procedure: HERNIA REPAIR VENTRAL ADULT, open;  Surgeon: Jordis Laneta FALCON, MD;  Location: [facility] ORS;  Service: General;  Laterality: N/A;   XI ROBOTIC ASSISTED PARAESOPHAGEAL HERNIA REPAIR N/A 01/20/2022   Procedure: XI ROBOTIC ASSISTED PARAESOPHAGEAL HERNIA REPAIR, CONVERTED TO OPEN, RNFA to assist;  Surgeon: Jordis Laneta FALCON, MD;  Location: [facility] ORS;  Service: General;  Laterality: N/A;    Family History  Problem Relation Age of Onset   Lung cancer Mother 73   Cancer Mother    Colon cancer Father 7   Heart disease Father    Alcohol abuse Father    Cancer Father    Esophageal cancer Brother 86   Diabetes Brother    Cancer Brother    Kidney disease Brother    Heart disease Brother    Lung cancer Maternal Aunt    Cancer Paternal Aunt        unk type   Cancer Maternal Grandfather        unk type   Drug abuse Daughter     Social History:  reports that he has quit smoking. His smoking use included cigarettes. He has a 17 pack-year smoking history. He has been exposed to tobacco smoke. He uses smokeless tobacco. He reports that he does not currently use alcohol after a past usage of about 12.0 standard drinks of alcohol per week. He reports current drug use. Drug: Hydrocodone .  Allergies:  Allergies  Allergen Reactions   Glipizide Other (See Comments) and Palpitations    Shaky, feel bad Other reaction(s): Dizziness   Ciprofloxacin  Itching    Caused itching and burning    Cephalexin  Rash   Duloxetine Anxiety and Nausea Only   Dupixent [Dupilumab] Rash    Medications reviewed.    ROS Full ROS performed and is otherwise negative other than what is stated in HPI   BP (!) 143/87   Pulse (!) 105   Temp 98.5 F (36.9 C) (Oral)   Ht 6' 2 (1.88 m)   Wt 239 lb (108.4 kg)   SpO2 97%   BMI 30.69  kg/m   Physical Exam  Vitals and nursing note reviewed. Exam conducted with a chaperone present.  Constitutional:      Appearance: Normal appearance. He is not ill-appearing.  Pulmonary:     Effort: Pulmonary effort is normal. No respiratory distress.      No stridor.  Abdominal:     General: Abdomen is flat.     Palpations: Abdomen is soft. There is no mass.     Tenderness: There is no abdominal tenderness. There is no guarding or rebound.     Hernia: No  hernia is present.     Comments: 3x3 mm superficial open wound w some serous drainage, tender to palpation, no mesh exposed. No peritonitis  Skin:    General: Skin is warm and dry.     Capillary Refill: Capillary refill takes less than 2 seconds.  Neurological:     General: No focal deficit present.     Mental Status: He is alert.  Psychiatric:        Mood and Affect: Mood normal.        Behavior: Behavior normal.        Thought Content: Thought content normal.        Judgment: Judgment normal.     Results for orders placed or performed during the hospital encounter of 10/10/23 (from the past 48 hours)  Lipase, blood     Status: None   Collection Time: 10/10/23  6:40 PM  Result Value Ref Range   Lipase 48 11 - 51 U/L    Comment: Performed at [facility], [address] [place], [place] [address]  Comprehensive metabolic panel     Status: Abnormal   Collection Time: 10/10/23  6:40 PM  Result Value Ref Range   Sodium 138 135 - 145 mmol/L   Potassium 3.6 3.5 - 5.1 mmol/L   Chloride 99 98 - 111 mmol/L   CO2 26 22 - 32 mmol/L   Glucose, Bld 192 (H) 70 - 99 mg/dL    Comment: Glucose reference range applies only to samples taken after fasting for at least 8 hours.   BUN 19 6 - 20 mg/dL   Creatinine, Ser 8.51 (H) 0.61 - 1.24 mg/dL   Calcium  10.1 8.9 - 10.3 mg/dL   Total Protein 8.3 (H) 6.5 - 8.1 g/dL   Albumin  4.7 3.5 - 5.0 g/dL   AST 35 15 - 41 U/L   ALT 39 0 - 44 U/L   Alkaline Phosphatase 51 38 - 126 U/L    Total Bilirubin 0.9 0.0 - 1.2 mg/dL   GFR, Estimated 54 (L) >60 mL/min    Comment: (NOTE) Calculated using the CKD-EPI Creatinine Equation (2021)    Anion gap 13 5 - 15    Comment: Performed at [facility], [address] [place], [place] [address]  CBC     Status: Abnormal   Collection Time: 10/10/23  6:40 PM  Result Value Ref Range   WBC 6.5 4.0 - 10.5 K/uL   RBC 5.21 4.22 - 5.81 MIL/uL   Hemoglobin 12.2 (L) 13.0 - 17.0 g/dL   HCT 60.9 60.9 - 47.9 %   MCV 74.9 (L) 80.0 - 100.0 fL   MCH 23.4 (L) 26.0 - 34.0 pg   MCHC 31.3 30.0 - 36.0 g/dL   RDW 83.4 (H) 88.4 - 84.4 %   Platelets 289 150 - 400 K/uL   nRBC 0.0 0.0 - 0.2 %    Comment: Performed at [facility], [address] [place], [place] [address]  Urinalysis, Routine w reflex microscopic -Urine, Clean Catch     Status: Abnormal   Collection Time: 10/10/23  6:40 PM  Result Value Ref Range   Color, Urine YELLOW (A) YELLOW   APPearance CLEAR (A) CLEAR   Specific Gravity, Urine 1.017 1.005 - 1.030   pH 5.0 5.0 - 8.0   Glucose, UA >=500 (A) NEGATIVE mg/dL   Hgb urine dipstick NEGATIVE NEGATIVE   Bilirubin Urine NEGATIVE NEGATIVE   Ketones, ur NEGATIVE NEGATIVE mg/dL   Protein, ur  NEGATIVE NEGATIVE mg/dL   Nitrite NEGATIVE NEGATIVE   Leukocytes,Ua NEGATIVE NEGATIVE   RBC / HPF 0-5 0 - 5 RBC/hpf   WBC, UA 0-5 0 - 5 WBC/hpf   Bacteria, UA NONE SEEN NONE SEEN   Squamous Epithelial / HPF 0 0 - 5 /HPF    Comment: Performed at [facility], [address], [place], [place] [address]  Lactic acid, plasma     Status: None   Collection Time: 10/10/23  6:40 PM  Result Value Ref Range   Lactic Acid, Venous 1.3 0.5 - 1.9 mmol/L    Comment: Performed at [facility], [address] [place], [place] [address]   CT ABDOMEN PELVIS W CONTRAST Result Date: 10/10/2023 CLINICAL DATA:  Acute nonlocalized abdominal pain, recurrent abdominal wall abscesses EXAM: CT ABDOMEN AND PELVIS WITH CONTRAST  TECHNIQUE: Multidetector CT imaging of the abdomen and pelvis was performed using the standard protocol following bolus administration of intravenous contrast. RADIATION DOSE REDUCTION: This exam was performed according to the departmental dose-optimization program which includes automated exposure control, adjustment of the mA and/or kV according to patient size and/or use of iterative reconstruction technique. CONTRAST:  OMNIPAQUE  IOHEXOL  300 MG/ML  SOLN COMPARISON:  09/02/2023 FINDINGS: Lower chest: No acute abnormality. Status post gastric fundoplication. Hepatobiliary: No focal liver abnormality is seen. No gallstones, gallbladder wall thickening, or biliary dilatation. Pancreas: Unremarkable Spleen: Unremarkable Adrenals/Urinary Tract: The adrenal glands are unremarkable. The kidneys are normal in size and position. No hydronephrosis. No intrarenal calculi. A 4 mm nonobstructing calculus is seen within the terminal left ureter at the left ureterovesicular junction (79/2). No additional ureteral calculi. The bladder is otherwise unremarkable. Stomach/Bowel: Severe descending and sigmoid colonic diverticulosis. Stomach, small bowel, and large bowel are otherwise unremarkable. Appendix absent. No evidence of obstruction or focal inflammation. There is a relatively thick walled catheterization containing gas again identified within the anterior peritoneum (37/2) measuring 4.1 x 3.3 x 0.8 cm in dimension. No significant fluid component. There is mild infiltration of the overlying subcutaneous fat at this level likely reflecting local inflammatory change. The inflammatory changes, however, appear improved since prior examination slightly. No gross free intraperitoneal gas. No free intraperitoneal fluid. Vascular/Lymphatic: Aortic atherosclerosis. No enlarged abdominal or pelvic lymph nodes. Reproductive: Prostate is unremarkable. Other: No abdominal wall hernia Musculoskeletal: No acute or significant osseous  findings. IMPRESSION: 1. Nonobstructing 4 mm calculus within the terminal left ureter at the left ureterovesicular junction. No associated hydronephrosis. 2. Persistent thick-walled gas containing collection within the anterior peritoneum, stable in size with slightly decreased overlying inflammatory change since prior examination. No significant fluid component. 3. Severe descending and sigmoid colonic diverticulosis. 4. Aortic atherosclerosis. Aortic Atherosclerosis (ICD10-I70.0). Electronically Signed   By: Dorethia Molt M.D.   On: 10/10/2023 20:11    Assessment/Plan: 58 year old with chronic wound infection.  I am concerned that he will continue to drain and may develop complications.  Sugars already going up which is a sign of uncontrolled infection.  I do think that we have failed conservative treatment and the next step will be excision of the mesh with hernia repair with  phasix ofr other absorbable mesh.  Procedure discussed with the patient in detail.  He is in agreement.  Encouraged him to take antibiotics and we will go ahead and schedule him for surgery within the next 48 hours.  Procedure discussed with him in detail.  Risk, benefits and possible complications including but not limited to, bleeding, infection, bowel injuries.  He  understands and wished to proceed  I personally spent a total of 30 minutes in the care of the patient today including performing a medically appropriate exam/evaluation, counseling and educating, placing orders, referring and communicating with other health care professionals, documenting clinical information in the EHR, independently interpreting and reviewing images studies and coordinating care.   Laneta Luna, MD [facility] General Surgeon

## 2023-10-13 ENCOUNTER — Other Ambulatory Visit: Payer: Self-pay

## 2023-10-13 ENCOUNTER — Ambulatory Visit: Admitting: Anesthesiology

## 2023-10-13 ENCOUNTER — Ambulatory Visit
Admission: RE | Admit: 2023-10-13 | Discharge: 2023-10-13 | Disposition: A | Discharge status: Home / Self Care | Attending: Surgery | Admitting: Surgery

## 2023-10-13 ENCOUNTER — Encounter: Payer: Self-pay | Admitting: Surgery

## 2023-10-13 ENCOUNTER — Encounter
Admission: RE | Disposition: A | Discharge status: Home / Self Care | Payer: Self-pay | Source: Home / Self Care | Attending: Surgery

## 2023-10-13 DIAGNOSIS — I129 Hypertensive chronic kidney disease with stage 1 through stage 4 chronic kidney disease, or unspecified chronic kidney disease: Secondary | ICD-10-CM | POA: Diagnosis not present

## 2023-10-13 DIAGNOSIS — Z7722 Contact with and (suspected) exposure to environmental tobacco smoke (acute) (chronic): Secondary | ICD-10-CM | POA: Diagnosis not present

## 2023-10-13 DIAGNOSIS — T8579XA Infection and inflammatory reaction due to other internal prosthetic devices, implants and grafts, initial encounter: Secondary | ICD-10-CM | POA: Insufficient documentation

## 2023-10-13 DIAGNOSIS — K432 Incisional hernia without obstruction or gangrene: Secondary | ICD-10-CM

## 2023-10-13 DIAGNOSIS — N189 Chronic kidney disease, unspecified: Secondary | ICD-10-CM | POA: Insufficient documentation

## 2023-10-13 DIAGNOSIS — I2510 Atherosclerotic heart disease of native coronary artery without angina pectoris: Secondary | ICD-10-CM | POA: Diagnosis not present

## 2023-10-13 DIAGNOSIS — E1151 Type 2 diabetes mellitus with diabetic peripheral angiopathy without gangrene: Secondary | ICD-10-CM | POA: Insufficient documentation

## 2023-10-13 DIAGNOSIS — E1122 Type 2 diabetes mellitus with diabetic chronic kidney disease: Secondary | ICD-10-CM | POA: Insufficient documentation

## 2023-10-13 DIAGNOSIS — F419 Anxiety disorder, unspecified: Secondary | ICD-10-CM | POA: Diagnosis not present

## 2023-10-13 DIAGNOSIS — F32A Depression, unspecified: Secondary | ICD-10-CM | POA: Insufficient documentation

## 2023-10-13 DIAGNOSIS — L02211 Cutaneous abscess of abdominal wall: Secondary | ICD-10-CM | POA: Diagnosis not present

## 2023-10-13 DIAGNOSIS — K219 Gastro-esophageal reflux disease without esophagitis: Secondary | ICD-10-CM | POA: Insufficient documentation

## 2023-10-13 DIAGNOSIS — E1142 Type 2 diabetes mellitus with diabetic polyneuropathy: Secondary | ICD-10-CM | POA: Insufficient documentation

## 2023-10-13 DIAGNOSIS — K5730 Diverticulosis of large intestine without perforation or abscess without bleeding: Secondary | ICD-10-CM | POA: Insufficient documentation

## 2023-10-13 DIAGNOSIS — J4489 Other specified chronic obstructive pulmonary disease: Secondary | ICD-10-CM | POA: Insufficient documentation

## 2023-10-13 DIAGNOSIS — G4733 Obstructive sleep apnea (adult) (pediatric): Secondary | ICD-10-CM | POA: Insufficient documentation

## 2023-10-13 DIAGNOSIS — G8929 Other chronic pain: Secondary | ICD-10-CM | POA: Diagnosis not present

## 2023-10-13 DIAGNOSIS — K449 Diaphragmatic hernia without obstruction or gangrene: Secondary | ICD-10-CM

## 2023-10-13 HISTORY — PX: VENTRAL HERNIA REPAIR: SHX424

## 2023-10-13 LAB — GLUCOSE, CAPILLARY
Glucose-Capillary: 132 mg/dL — ABNORMAL HIGH (ref 70–99)
Glucose-Capillary: 125 mg/dL — ABNORMAL HIGH (ref 70–99)

## 2023-10-13 SURGERY — REPAIR, HERNIA, VENTRAL
Anesthesia: General

## 2023-10-13 MED ORDER — MIDAZOLAM HCL 2 MG/2ML IJ SOLN
INTRAMUSCULAR | Status: DC | PRN
Start: 2023-10-13 — End: 2023-10-13
  Administered 2023-10-13: 2 mg via INTRAVENOUS

## 2023-10-13 MED ORDER — HYDROMORPHONE HCL 1 MG/ML IJ SOLN
INTRAMUSCULAR | Status: AC
Start: 2023-10-13 — End: 2023-10-13
  Filled 2023-10-13: qty 1

## 2023-10-13 MED ORDER — LACTATED RINGERS IV SOLN: INTRAVENOUS | Status: DC

## 2023-10-13 MED ORDER — CHLORHEXIDINE GLUCONATE 0.12 % MT SOLN
OROMUCOSAL | Status: AC
  Filled 2023-10-13: qty 15

## 2023-10-13 MED ORDER — 0.9 % SODIUM CHLORIDE (POUR BTL) OPTIME
TOPICAL | Status: DC | PRN
  Administered 2023-10-13: 1000 mL

## 2023-10-13 MED ORDER — HYDROMORPHONE HCL 1 MG/ML IJ SOLN
INTRAMUSCULAR | Status: DC | PRN
  Administered 2023-10-13: 1 mg via INTRAVENOUS

## 2023-10-13 MED ORDER — LACTATED RINGERS IV SOLN: INTRAVENOUS | Status: DC | PRN

## 2023-10-13 MED ORDER — GABAPENTIN 300 MG PO CAPS
ORAL_CAPSULE | ORAL | Status: AC
Start: 2023-10-13 — End: 2023-10-13
  Filled 2023-10-13: qty 1

## 2023-10-13 MED ORDER — BUPIVACAINE LIPOSOME 1.3 % IJ SUSP
INTRAMUSCULAR | Status: AC
  Filled 2023-10-13: qty 20

## 2023-10-13 MED ORDER — PHENYLEPHRINE HCL-NACL 20-0.9 MG/250ML-% IV SOLN
INTRAVENOUS | Status: AC
  Filled 2023-10-13: qty 250

## 2023-10-13 MED ORDER — VASOPRESSIN 20 UNIT/ML IV SOLN
INTRAVENOUS | Status: DC | PRN
Start: 2023-10-13 — End: 2023-10-13
  Administered 2023-10-13: 1 [IU] via INTRAVENOUS

## 2023-10-13 MED ORDER — OXYCODONE HCL 5 MG PO TABS: 5.0000 mg | ORAL_TABLET | Freq: Four times a day (QID) | ORAL | 0 refills | Status: DC | PRN

## 2023-10-13 MED ORDER — FENTANYL CITRATE (PF) 100 MCG/2ML IJ SOLN: 25.0000 ug | INTRAMUSCULAR | Status: DC | PRN

## 2023-10-13 MED ORDER — SUGAMMADEX SODIUM 200 MG/2ML IV SOLN
INTRAVENOUS | Status: DC | PRN
  Administered 2023-10-13: 200 mg via INTRAVENOUS

## 2023-10-13 MED ORDER — CELECOXIB 200 MG PO CAPS
ORAL_CAPSULE | ORAL | Status: AC
  Filled 2023-10-13: qty 1

## 2023-10-13 MED ORDER — ONDANSETRON HCL 4 MG/2ML IJ SOLN
INTRAMUSCULAR | Status: DC | PRN
  Administered 2023-10-13: 4 mg via INTRAVENOUS

## 2023-10-13 MED ORDER — ACETAMINOPHEN 500 MG PO TABS
ORAL_TABLET | ORAL | Status: AC
  Filled 2023-10-13: qty 2

## 2023-10-13 MED ORDER — KETAMINE HCL 50 MG/5ML IJ SOSY
PREFILLED_SYRINGE | INTRAMUSCULAR | Status: AC
  Filled 2023-10-13: qty 5

## 2023-10-13 MED ORDER — ACETAMINOPHEN 10 MG/ML IV SOLN: 1000.0000 mg | Freq: Once | INTRAVENOUS | Status: DC | PRN

## 2023-10-13 MED ORDER — MIDAZOLAM HCL 2 MG/2ML IJ SOLN
INTRAMUSCULAR | Status: AC
  Filled 2023-10-13: qty 2

## 2023-10-13 MED ORDER — PROPOFOL 1000 MG/100ML IV EMUL
INTRAVENOUS | Status: AC
  Filled 2023-10-13: qty 100

## 2023-10-13 MED ORDER — KETAMINE HCL 50 MG/5ML IJ SOSY
PREFILLED_SYRINGE | INTRAMUSCULAR | Status: DC | PRN
Start: 2023-10-13 — End: 2023-10-13
  Administered 2023-10-13: 10 mg via INTRAVENOUS

## 2023-10-13 MED ORDER — ONDANSETRON HCL 4 MG/2ML IJ SOLN: 4.0000 mg | Freq: Once | INTRAMUSCULAR | Status: DC | PRN

## 2023-10-13 MED ORDER — ROCURONIUM BROMIDE 100 MG/10ML IV SOLN
INTRAVENOUS | Status: DC | PRN
  Administered 2023-10-13: 50 mg via INTRAVENOUS

## 2023-10-13 MED ORDER — BUPIVACAINE-EPINEPHRINE 0.25% -1:200000 IJ SOLN
INTRAMUSCULAR | Status: DC | PRN
  Administered 2023-10-13: 50 mL via INTRAMUSCULAR

## 2023-10-13 MED ORDER — OXYCODONE HCL 5 MG/5ML PO SOLN: 5.0000 mg | Freq: Once | ORAL | Status: DC | PRN

## 2023-10-13 MED ORDER — BUPIVACAINE-EPINEPHRINE (PF) 0.25% -1:200000 IJ SOLN
INTRAMUSCULAR | Status: AC
  Filled 2023-10-13: qty 30

## 2023-10-13 MED ORDER — PROPOFOL 1000 MG/100ML IV EMUL
INTRAVENOUS | Status: AC
Start: 2023-10-13 — End: 2023-10-13
  Filled 2023-10-13: qty 100

## 2023-10-13 MED ORDER — PROPOFOL 10 MG/ML IV BOLUS
INTRAVENOUS | Status: DC | PRN
Start: 2023-10-13 — End: 2023-10-13
  Administered 2023-10-13: 150 ug/kg/min via INTRAVENOUS
  Administered 2023-10-13: 150 mg via INTRAVENOUS

## 2023-10-13 MED ORDER — PHENYLEPHRINE HCL-NACL 20-0.9 MG/250ML-% IV SOLN
INTRAVENOUS | Status: DC | PRN
  Administered 2023-10-13: 50 ug/min via INTRAVENOUS

## 2023-10-13 MED ORDER — OXYCODONE HCL 5 MG PO TABS: 5.0000 mg | ORAL_TABLET | Freq: Once | ORAL | Status: DC | PRN

## 2023-10-13 MED ORDER — SODIUM CHLORIDE 0.9 % IV SOLN: INTRAVENOUS | Status: DC | PRN

## 2023-10-13 MED ORDER — LIDOCAINE HCL (CARDIAC) PF 100 MG/5ML IV SOSY
PREFILLED_SYRINGE | INTRAVENOUS | Status: DC | PRN
  Administered 2023-10-13: 100 mg via INTRAVENOUS

## 2023-10-13 SURGICAL SUPPLY — 27 items
BLADE CLIPPER SURG (BLADE) IMPLANT
CHLORAPREP W/TINT 26 (MISCELLANEOUS) IMPLANT
CLIP APPLIE 11 MED OPEN (CLIP) IMPLANT
CLIP APPLIE 13 LRG OPEN (CLIP) IMPLANT
DERMABOND ADVANCED .7 DNX12 (GAUZE/BANDAGES/DRESSINGS) IMPLANT
DRAPE LAPAROTOMY 100X77 ABD (DRAPES) ×1 IMPLANT
DRSG OPSITE POSTOP 4X10 (GAUZE/BANDAGES/DRESSINGS) IMPLANT
DRSG OPSITE POSTOP 4X8 (GAUZE/BANDAGES/DRESSINGS) IMPLANT
ELECT CAUTERY BLADE 6.4 (BLADE) ×1 IMPLANT
ELECTRODE REM PT RTRN 9FT ADLT (ELECTROSURGICAL) ×1 IMPLANT
GLOVE BIO SURGEON STRL SZ7 (GLOVE) ×1 IMPLANT
GOWN STRL REUS W/ TWL LRG LVL3 (GOWN DISPOSABLE) ×2 IMPLANT
KIT TURNOVER KIT A (KITS) ×1 IMPLANT
MANIFOLD NEPTUNE II (INSTRUMENTS) ×1 IMPLANT
NDL FRENCH SPRG EYE 1/2 CRC (NEEDLE) IMPLANT
NDL HYPO 22X1.5 SAFETY MO (MISCELLANEOUS) ×1 IMPLANT
NEEDLE HYPO 22X1.5 SAFETY MO (MISCELLANEOUS) ×1 IMPLANT
PACK BASIN MINOR ARMC (MISCELLANEOUS) ×1 IMPLANT
SPONGE T-LAP 18X18 ~~LOC~~+RFID (SPONGE) ×1 IMPLANT
STAPLER SKIN PROX 35W (STAPLE) IMPLANT
SUT ETHIBOND 0 MO6 C/R (SUTURE) ×1 IMPLANT
SUT VIC AB 2-0 SH 27XBRD (SUTURE) ×2 IMPLANT
SUTURE MNCRL 4-0 27XMF (SUTURE) IMPLANT
SYR 20ML LL LF (SYRINGE) ×2 IMPLANT
TRAP FLUID SMOKE EVACUATOR (MISCELLANEOUS) ×1 IMPLANT
WATER STERILE IRR 1000ML POUR (IV SOLUTION) ×1 IMPLANT
WATER STERILE IRR 500ML POUR (IV SOLUTION) ×1 IMPLANT

## 2023-10-13 NOTE — Anesthesia Preprocedure Evaluation (Addendum)
 Anesthesia Evaluation  Patient identified by MRN, date of birth, ID band Patient awake    Reviewed: Allergy & Precautions, NPO status , Patient's Chart, lab work & pertinent test results  History of Anesthesia Complications Negative for: history of anesthetic complications  Airway Mallampati: II   Neck ROM: Full    Dental  (+) Edentulous Upper, Edentulous Lower   Pulmonary asthma , sleep apnea and Continuous Positive Airway Pressure Ventilation , COPD, former smoker (quit 11/2022; current vaping)   Pulmonary exam normal breath sounds clear to auscultation       Cardiovascular hypertension, + CAD and + Peripheral Vascular Disease  Normal cardiovascular exam Rhythm:Regular Rate:Normal  ECG 08/16/23:  Sinus tachycardia Borderline right axis deviation RSR' in V1 or V2, probably normal variant ST elevation, consider inferior injury  Echo 06/08/23:  NORMAL LEFT VENTRICULAR SYSTOLIC FUNCTION WITH MILD LVH  ESTIMATED EF: >55%  NORMAL LA PRESSURES WITH DIASTOLIC DYSFUNCTION (GRADE 1)  NORMAL RIGHT VENTRICULAR SYSTOLIC FUNCTION  VALVULAR REGURGITATION: No AR, TRIVIAL MR, TRIVIAL PR, TRIVIAL TR  NO VALVULAR STENOSIS   Myocardial perfusion 06/08/23:  Pharmacological stress test is abnormal.  No chest pain or ischemic EKG changes.  SPECT images suggest possible small area of mild ischemia in mid to apical  anteroseptal wall, sum difference score 2.  TID elevated, 1.28 which can  suggest multivessel CAD.  Left ventricle normal in size with LVEF of 56%.  Intermediate risk study.  Consider coronary CT angiogram for further evaluation.     Neuro/Psych  PSYCHIATRIC DISORDERS Anxiety Depression    Chronic pain  Neuromuscular disease (peripheral neuropathy)    GI/Hepatic hiatal hernia,GERD  ,,  Endo/Other  diabetes, Type 2  Obesity   Renal/GU Renal disease (CKD; nephrolithiasis)     Musculoskeletal   Abdominal   Peds   Hematology  (+) Blood dyscrasia, anemia   Anesthesia Other Findings Last dose of Mounjaro 10/06/23.   Cardiology note 09/02/23:  58 y.o. male with  1. Coronary artery disease involving native coronary artery of native heart without angina pectoris  2. Essential hypertension  3. PVD (peripheral vascular disease) ()  4. Sleep apnea, unspecified type  5. Mixed hyperlipidemia  6. Type 2 diabetes mellitus with diabetic neuropathy, without long-term current use of insulin  (CMS-HCC)  7. Chest pain, unspecified type  8. SOBOE (shortness of breath on exertion)  9. Stable angina ()  10. Asthma with COPD (chronic obstructive pulmonary disease) (CMS/HHS-HCC)  11. Obesity due to excess calories without serious comorbidity, unspecified class   Plan  Coronary artery disease history of smoking denies any recent angina currently on aspirin  lisinopril  Imdur Crestor  Obesity recommend modest weight loss exercise portion control Hypertension states his blood pressures been running low so he cut his losartan in half and feels somewhat better have advised him to continue to follow his blood pressure in case additional modifications are necessary Obstructive sleep apnea recommend sleep study CPAP weight loss Hyperlipidemia continue Crestor  therapy for lipid management GERD patient been maintained on Protonix  therapy for reflux type symptoms Diabetes patient is on metformin  Mounjaro NPH insulin  outpatient follow-up with primary physician or endocrinology COPD continue inhalers continue advised patient refrain from tobacco abuse Have the patient follow-up in 6 months  Return in about 3 months (around 12/03/2023).    Reproductive/Obstetrics                              Anesthesia Physical Anesthesia Plan  ASA: 3  Anesthesia Plan: General   Post-op Pain Management:    Induction: Intravenous  PONV Risk Score and Plan: 2 and Ondansetron , Dexamethasone  and Treatment may vary due  to age or medical condition  Airway Management Planned: Oral ETT  Additional Equipment:   Intra-op Plan:   Post-operative Plan: Extubation in OR  Informed Consent: I have reviewed the patients History and Physical, chart, labs and discussed the procedure including the risks, benefits and alternatives for the proposed anesthesia with the patient or authorized representative who has indicated his/her understanding and acceptance.     Dental advisory given  Plan Discussed with: CRNA  Anesthesia Plan Comments: (Patient consented for risks of anesthesia including but not limited to:  - adverse reactions to medications - damage to eyes, teeth, lips or other oral mucosa - nerve damage due to positioning  - sore throat or hoarseness - damage to heart, brain, nerves, lungs, other parts of body or loss of life  Informed patient about role of CRNA in peri- and intra-operative care.  Patient voiced understanding.)         Anesthesia Quick Evaluation

## 2023-10-13 NOTE — Discharge Instructions (Signed)
How to Change Your Wound Dressing A dressing is a material that is placed in and over a wound. A dressing helps your wound heal by protecting it from: Germs (bacteria). Another injury. Getting too dry or too wet. There are several types of wound dressings. Some examples are: Bandages. Gauze pads. Antimicrobial dressings. These prevent or treat infection and may contain something that kills germs (an antiseptic), such as silver or iodine. Absorptive dressings, such as calcium alginate or foam. These help your wound to drain. Impregnated dressings. These are dressings that have a substance that will help your wound to heal. These are general guidelines. Follow your doctor's instructions about how to care for your wound. What are the risks? It is usually safe to change your dressing. The sticky (adhesive) tape that is used with a dressing may make your skin sore or irritated, or it may cause a rash. These are the most common problems. However, more serious problems can happen, such as: Bleeding. Infection. Supplies needed: Set up a clean area for wound care. You will need: A trash bag that you can throw away. Have it open and ready to use. Hand sanitizer. Cleaning solution as told by your doctor. This may include: Germ-free (sterile) water. Germ-free salt water (saline). Wound cleanser. New wound dressing material. Make sure to open the dressing package so the dressing stays on the inside of the package. Medical adhesive, roll gauze, or tape. You may also need the following in your clean area: A box of vinyl gloves. Adhesive remover. Skin protectant. This may be a wipe, film, or spray. Clean or germ-free scissors. A cotton-tipped applicator. How to change your dressing How often you change your dressing will depend on your wound. Change the dressing as often as told by your doctor. Your doctor may change your dressing, or a family member, friend, or caregiver may be shown how to do it.  It is important to: Wash your hands with soap and water for at least 20 seconds before and after you change your bandage. If you cannot use soap and water, use hand sanitizer. Wear gloves and protective clothing while changing a dressing. This may include eye protection. Never let anyone change your dressing if he or she has any of the following: An infection. A skin problem. A skin wound or cut of any size. Preparing to change your dressing Take a shower before you do the first dressing change of the day. Before you shower, ask your doctor if you should put plastic leak-proof sealing wrap over your dressing to protect it. If needed, take pain medicine 30 minutes before you change your dressing as told by your doctor. Taking off your old dressing  Wash your hands with soap and water for at least 20 seconds. Dry your hands with a clean towel. If you cannot use soap and water, use hand sanitizer. Go to the clean area that you have set up with all the supplies you will need. If you are using gloves, put the gloves on before you take off the dressing. Gently take off any adhesive or tape by pulling it off in the direction of the way your hair grows. Only touch the outside edges of the dressing. If you are told to use an adhesive remover to loosen the edges of the dressing, make sure to avoid the wound area. Take off the dressing. If the dressing sticks to your skin, wet the dressing with the cleaner that your doctor says to use. This helps it   come off more easily. Take off any gauze or packing in your wound. Look at the dressing and wound for any changes in the drainage, such as: The color. The amount. The smell. Throw the old dressing supplies into the trash bag. Take off your gloves. To take off each glove, grab the cuff with your other hand and turn the glove inside out. Put the gloves in the trash right away. Wash your hands with soap and water for at least 20 seconds. Dry your hands with a  clean towel. If you cannot use soap and water, use hand sanitizer. Cleaning your wound Follow instructions from your doctor about how to clean your wound. This may include using the cleaner that your doctor recommends. Do not use over-the-counter medicated or antiseptic creams, sprays, liquids, or dressings unless your doctor tells you to do that. Use a clean gauze pad to clean the area fully with the cleaner that your doctor recommends. Throw the gauze pad into the trash bag. Wash your hands with soap and water for at least 20 seconds. Dry your hands with a clean towel. If you cannot use soap and water, use hand sanitizer. Putting on the dressing If your doctor told you to use a skin protectant, put it on the skin around the wound. Gently pack the wound if told by your doctor. Cover the wound with the recommended dressing. Make sure to touch only the outside edges of the dressing. Do not touch the inside of the dressing. Attach the dressing so all sides stay in place. You may do this with medical adhesive, roll gauze, or tape. If you use tape, do not wrap the tape all the way around your arm or leg. Take off your gloves. Put them in the trash bag with the old dressing. Tie the bag shut and throw it away. Wash your hands with soap and water for at least 20 seconds. Dry your hands with a clean towel. If you cannot use soap and water, use hand sanitizer. Follow these instructions at home: Wound care  Check your wound every day for signs of infection, or as often as told by your doctor. Check for: More redness, swelling, or pain. More fluid or blood. Warmth. Pus or a bad smell. General instructions Take over-the-counter and prescription medicines only as told by your doctor. If you were prescribed an antibiotic medicine or ointment, take or apply it as told by your doctor. Do not stop using it even if you start to feel better. Keep all follow-up visits. Contact a doctor if: You have new  pain. You have irritation, a rash, or itching around the wound or dressing. It hurts to change your dressing. Changing your dressing causes a lot of bleeding. You have signs of infection, such as: More redness, swelling, or pain. More fluid or blood. Warmth. Pus or a bad smell. Red streaks leading from the wound. A fever. Get help right away if: You have very bad pain. Your wound is bleeding, and the bleeding does not stop when you put pressure on it. Summary A dressing is a material that is placed in and over a wound. A dressing helps your wound heal. Wash your hands for at least 20 seconds before and after each dressing change. Follow your doctor's instructions about how to care for your wound. Check your wound for signs of infection. This information is not intended to replace advice given to you by your health care provider. Make sure you discuss any questions you   have with your health care provider. Document Revised: 06/05/2020 Document Reviewed: 06/05/2020 [employer]vier Patient Education  2024 [employer]  

## 2023-10-13 NOTE — Anesthesia Postprocedure Evaluation (Signed)
 Anesthesia Post Note  Patient: Calvin A Morad  Procedure(s) Performed: EXCISIONAL DEBRIDEMENT OF SKIN AND FASCIA  Patient location during evaluation: PACU Anesthesia Type: General Level of consciousness: awake and alert Pain management: pain level controlled Vital Signs Assessment: post-procedure vital signs reviewed and stable Respiratory status: spontaneous breathing, nonlabored ventilation, respiratory function stable and patient connected to nasal cannula oxygen  Cardiovascular status: blood pressure returned to baseline and stable Postop Assessment: no apparent nausea or vomiting Anesthetic complications: no   There were no known notable events for this encounter.   Last Vitals:  Vitals:   10/13/23 1345 10/13/23 1356  BP: (!) 92/56 113/69  Pulse: 71 78  Resp: 14 16  Temp:    SpO2: 95% 98%    Last Pain:  Vitals:   10/13/23 1356  TempSrc: Temporal  PainSc: 0-No pain                 Debby Mines

## 2023-10-13 NOTE — Transfer of Care (Signed)
 Immediate Anesthesia Transfer of Care Note  Patient: Darren Allen  Procedure(s) Performed: REPAIR, HERNIA, VENTRAL  Patient Location: PACU  Anesthesia Type:General  Level of Consciousness: awake and patient cooperative  Airway & Oxygen  Therapy: Patient Spontanous Breathing and Patient connected to face mask oxygen   Post-op Assessment: Report given to RN and Post -op Vital signs reviewed and stable  Post vital signs: stable  Last Vitals:  Vitals Value Taken Time  BP 100/68 10/13/23 13:01  Temp    Pulse 81 10/13/23 13:03  Resp 13 10/13/23 13:03  SpO2 98 % 10/13/23 13:03  Vitals shown include unfiled device data.  Last Pain:  Vitals:   10/13/23 1100  TempSrc: Temporal  PainSc: 2          Complications: No notable events documented.

## 2023-10-13 NOTE — Op Note (Signed)
  10/13/2023  1:22 PM  PATIENT:  Darren Allen  58 y.o. male  PRE-OPERATIVE DIAGNOSIS:  Mesh infection  POST-OPERATIVE DIAGNOSIS:  stitch causing chronic inflammation  PROCEDURE:   1. Excisional debridement of skin and fascia measuring 3cm2                              SURGEON:  Surgeons and Role:    * Ferdinand Revoir F, MD - Primary  FINDINGS: very superficial chronic inflammation, no purulence, some granulation tissue involving the skin and the sub q and one Ethibond stitch. No evidence of mesh infection  ANESTHESIA: GETA  DICTATION:  Patient was explained about the procedure in detail; risks, benefits, possible complications and a consent was obtained. The patient taken to the operating room and placed in the supine position.  Inspection revealed a chronic 2 mm sinus from the skin to the sub q. Using 15 blade I performed an elliptical incision. I also noted that the ethibond stitch was part of this inflammatory process. I performed the debridement with electrocautery and using curette the fascia was also debrided. Please note that the mesh was deep to this inflammatory process, there were no deep track and the mesh was very well incorporated. I felt that the superficial inflammation was being caused by the Ethibond material. I did obtain cultures. I closed only the dermis w 2-0 vicryl in an interrupted fashion. Irrigation with normal saline performed. Liposomal Marcaine  quarter percent with epinephrine  was injected around the wound site. Needle and laparotomy counts were correct and there were no immediate complications  Laneta JULIANNA Luna, MD

## 2023-10-13 NOTE — Interval H&P Note (Signed)
 History and Physical Interval Note:  10/13/2023 11:57 AM  Darren Allen  has presented today for surgery, with the diagnosis of Ventral hernia, recurrent.  The various methods of treatment have been discussed with the patient and family. After consideration of risks, benefits and other options for treatment, the patient has consented to  Procedure(s) with comments: REPAIR, HERNIA, VENTRAL (N/A) - open procedure w/mesh as a surgical intervention.  The patient's history has been reviewed, patient examined, no change in status, stable for surgery.  I have reviewed the patient's chart and labs.  Questions were answered to the patient's satisfaction.     Shaley Leavens F Shalimar Mcclain

## 2023-10-13 NOTE — Anesthesia Procedure Notes (Signed)
 Procedure Name: Intubation Date/Time: 10/13/2023 12:34 PM  Performed by: Norleen Alberta HERO., CRNAPre-anesthesia Checklist: Patient identified, Patient being monitored, Timeout performed, Emergency Drugs available and Suction available Patient Re-evaluated:Patient Re-evaluated prior to induction Oxygen  Delivery Method: Circle system utilized Preoxygenation: Pre-oxygenation with 100% oxygen  Induction Type: IV induction Ventilation: Mask ventilation without difficulty Laryngoscope Size: McGrath and 4 Grade View: Grade I Tube type: Oral Tube size: 7.5 mm Number of attempts: 1 Airway Equipment and Method: Stylet Placement Confirmation: ETT inserted through vocal cords under direct vision, positive ETCO2 and breath sounds checked- equal and bilateral Secured at: 20 cm Tube secured with: Tape Dental Injury: Teeth and Oropharynx as per pre-operative assessment

## 2023-10-14 ENCOUNTER — Encounter: Payer: Self-pay | Admitting: Surgery

## 2023-10-14 ENCOUNTER — Telehealth: Payer: Self-pay | Admitting: Surgery

## 2023-10-14 LAB — SURGICAL PATHOLOGY

## 2023-10-14 NOTE — Telephone Encounter (Signed)
 Pt said that dr did a EXCISIONAL DEBRIDEMENT OF SKIN AND FASCIA and is in so much pain and he does not think that he should be having this much pain. He is taking the 5 mg of pain medication but is not even touching the pain. Please advise.Pt # [phone]

## 2023-10-15 ENCOUNTER — Telehealth: Payer: Self-pay | Admitting: Surgery

## 2023-10-15 NOTE — Telephone Encounter (Signed)
 Pt said that his incision is now having creamy stuff coming just pouring out of the incision when he pulled the bandage off and will not stop. Please call pt as soon as you can and advise him. Pt # is [phone]

## 2023-10-16 ENCOUNTER — Ambulatory Visit
Admission: EM | Admit: 2023-10-16 | Discharge: 2023-10-16 | Disposition: A | Discharge status: Home / Self Care | Attending: Family Medicine | Admitting: Family Medicine

## 2023-10-16 ENCOUNTER — Encounter: Payer: Self-pay | Admitting: Emergency Medicine

## 2023-10-16 DIAGNOSIS — Z5189 Encounter for other specified aftercare: Secondary | ICD-10-CM | POA: Diagnosis not present

## 2023-10-16 NOTE — ED Triage Notes (Addendum)
 Pt states he had surgery on his abdomen 3 days ago. This was repairing abscesses after a hernia surgery in January. He states yesterday he started having a lot of bloody and yellow drainage. He states his bandage was soaked and his bed was wet.   He called [facility] yesterday and was told the drainage was normal. He called back this morning and was told to come to UC before going to the ER.  Unsure if he has had fever.   Pt is currently taking bactrim .

## 2023-10-16 NOTE — Discharge Instructions (Signed)
 Call surgeon on Monday.  If you worsen, go to the ER.  Take care  Dr. Bluford

## 2023-10-16 NOTE — ED Provider Notes (Signed)
 [facility]    CSN: [id number] Arrival date & time: 10/16/23  1155      History   Chief Complaint Chief Complaint  Patient presents with   Wound Check    HPI 58 year old male presents for evaluation of the above.  Patient recently had surgery (excisional debridement) on 7/30.  Patient reports that on Thursday he developed significant abdominal pain.  He has had significant purulent discharge from his wound.  Cultures from the surgical procedure have been negative and remain negative.  He is on Bactrim  currently.  He called the on-call service and was advised to come in for evaluation.  No fever.  Abdominal pain is currently mild to have 3/10 in severity.  Past Medical History:  Diagnosis Date   Allergy    Anxiety    Asthma    Chronic kidney disease    Chronic lower back pain    a.) followed by pain management; on COT   COPD (chronic obstructive pulmonary disease) (HCC)    Coronary artery disease    a.) cCTA 06/30/2021: Ca2+ = 216 (84th %ile; 25049% pRCA and pLAD))   DDD (degenerative disc disease), lumbosacral    Depression    Diabetic peripheral neuropathy (HCC)    Diastolic dysfunction 12/27/2020   a.) TTE 12/27/2020: EF >55%, no RWMAs, G1DD, norm RVSF, triv MR/TR?PR   Diverticulosis    Emphysema of lung (HCC)    Erectile dysfunction    a.) on PDE5i (tadalafil )   GERD (gastroesophageal reflux disease)    Hepatic steatosis    History of kidney stones    Hyperlipidemia    Hypertension    Hypogonadism male    a.) on exogenous TRT (depotestosterone cypionate)   IDA (iron deficiency anemia)    Long term current use of opiate analgesic    a.) followed by pain management; naloxone  Rx available   OSA on CPAP    Osteoporosis    Paraesophageal hernia    a.) s/p robotic assisted repair 01/2022   PVD (peripheral vascular disease) (HCC)    Sleep apnea    T2DM (type 2 diabetes mellitus) (HCC)    Umbilical hernia    a.) s/p repair 01/20/2022   Ventral hernia      Patient Active Problem List   Diagnosis Date Noted   Trigger point with back pain (Right) 08/24/2023   Abdominal wall abscess 07/28/2023   Low back pain of over 3 months duration 07/13/2023   Mechanical low back pain 07/13/2023   Recurrent low back pain 07/13/2023   DDD (degenerative disc disease), lumbosacral w/ LBP 05/25/2023   Incisional hernia, without obstruction or gangrene 04/08/2023   Lumbar facet joint pain 02/17/2023   Spondylosis without myelopathy or radiculopathy, lumbosacral region 02/17/2023   Chronic lower extremity pain (intermittent) (Right) 12/24/2022   Chronic lower extremity pain (intermittent) (Left) 12/24/2022   Lumbar radiculitis (Right) 12/24/2022   Lumbar radiculitis (Left) 12/24/2022   Boil of buttock 12/24/2022   Chronic low back pain (Bilateral) w/ sciatica (Right) 12/23/2022   Acne vulgaris 09/24/2022   Chronic use of opiate for therapeutic purpose 08/24/2022   Abnormal MRI, thoracic spine (07/05/2020) 06/09/2022   Coronary artery disease involving native coronary artery of native heart without angina pectoris 05/06/2022   Chronic low back pain (Bilateral) w/ sciatica (Left) 04/02/2022   Advanced care planning/counseling discussion 02/20/2022   Hypotension due to drugs 02/04/2022   Elevated lipase 02/04/2022   Iron deficiency anemia 02/04/2022   Hyperkalemia 01/21/2022   S/P repair  of paraesophageal hernia 01/20/2022   Umbilical hernia without obstruction and without gangrene 01/20/2022   Hiatal hernia    Adenomatous polyp of colon    Abnormal MRI, lumbar spine (03/11/2020) 10/29/2021   Lumbosacral lateral recess stenosis (Left: L5-S1) 10/29/2021   Elevated C-reactive protein (CRP) 09/18/2021   Chronic pain syndrome 09/08/2021   Pharmacologic therapy 09/08/2021   Disorder of skeletal system 09/08/2021   Problems influencing health status 09/08/2021   Lumbar facet syndrome 09/08/2021   Lumbosacral radiculopathy at S1 (Left) 09/08/2021    Chronic feet pain (2ry area of Pain) (Bilateral) 09/08/2021   Chronic ankle pain (Left) 09/08/2021   Abnormal NCS (nerve conduction studies) (02/20/2020) 09/08/2021   Neuropathy 06/19/2021   Gout 05/28/2021   PVD (peripheral vascular disease) (HCC) 05/28/2021   Hypertension associated with diabetes (HCC)    Chronic ankle pain (Bilateral) 07/25/2020   Chronic low back pain (1ry area of Pain) (Bilateral) (R>L) w/o sciatica 07/25/2020   Diabetic peripheral neuropathy (HCC) 12/30/2019   Other acquired hammer toe 11/28/2019   Ankle fracture 03/23/2017   Type 2 diabetes mellitus with diabetic neuropathy, with long-term current use of insulin  (HCC) 04/16/2016   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   Calculus of kidney 02/04/2015   Chronic obstructive pulmonary disease (HCC) 01/17/2013   Acid reflux 01/17/2013   Hyperlipidemia associated with type 2 diabetes mellitus (HCC) 01/17/2013   Apnea, sleep 01/17/2013   Testicular hypofunction 01/17/2013   Adiposity 07/06/2011    Past Surgical History:  Procedure Laterality Date   ABDOMINAL HYSTERECTOMY     APPENDECTOMY     BRONCHOSCOPY  2016   CIRCUMCISION     COLONOSCOPY WITH PROPOFOL  N/A 12/31/2021   Procedure: COLONOSCOPY WITH PROPOFOL ;  Surgeon: Therisa Bi, MD;  Location[facility] ENDOSCOPY;  Service: Gastroenterology;  Laterality: N/A;   ESOPHAGOGASTRODUODENOSCOPY N/A 12/31/2021   Procedure: ESOPHAGOGASTRODUODENOSCOPY (EGD);  Surgeon: Therisa Bi, MD;  Location: [facility] ENDOSCOPY;  Service: Gastroenterology;  Laterality: N/A;   HERNIA REPAIR  07/2023   INCISION AND DRAINAGE PERIRECTAL ABSCESS N/A 02/04/2015   Procedure: IRRIGATION AND DEBRIDEMENT PERIRECTAL ABSCESS;  Surgeon: Lonni Brands, MD;  Location: [facility] ORS;  Service: General;  Laterality: N/A;   INSERTION OF MESH  01/20/2022   Procedure: INSERTION OF MESH;  Surgeon: Jordis Laneta FALCON, MD;  Location: [facility] ORS;  Service: General;;   INSERTION OF MESH N/A 04/08/2023   Procedure: INSERTION OF MESH;   Surgeon: Jordis Laneta FALCON, MD;  Location: [facility] ORS;  Service: General;  Laterality: N/A;   KIDNEY STONE SURGERY Right    LEFT HEART CATH AND CORONARY ANGIOGRAPHY Left 08/16/2023   Procedure: LEFT HEART CATH AND CORONARY ANGIOGRAPHY;  Surgeon: Florencio Cara BIRCH, MD;  Location: [facility] INVASIVE CV LAB;  Service: Cardiovascular;  Laterality: Left;   lung mass removal N/A    MUSCLE BIOPSY     ORIF ANKLE FRACTURE Left 03/23/2017   Procedure: OPEN REDUCTION INTERNAL FIXATION (ORIF) ANKLE FRACTURE;  Surgeon: Tobie Priest, MD;  Location: [facility] ORS;  Service: Orthopedics;  Laterality: Left;   RECTAL EXAM UNDER ANESTHESIA  02/04/2015   Procedure: RECTAL EXAM UNDER ANESTHESIA;  Surgeon: Lonni Brands, MD;  Location: [facility] ORS;  Service: General;;   SYNDESMOSIS REPAIR Left 03/23/2017   Procedure: SYNDESMOSIS REPAIR;  Surgeon: Tobie Priest, MD;  Location: [facility] ORS;  Service: Orthopedics;  Laterality: Left;   UMBILICAL HERNIA REPAIR  01/20/2022   Procedure: HERNIA REPAIR UMBILICAL ADULT;  Surgeon: Jordis Laneta FALCON, MD;  Location: [facility] ORS;  Service: General;;   VASECTOMY  VENTRAL HERNIA REPAIR N/A 04/08/2023   Procedure: HERNIA REPAIR VENTRAL ADULT, open;  Surgeon: Jordis Laneta FALCON, MD;  Location: [facility] ORS;  Service: General;  Laterality: N/A;   VENTRAL HERNIA REPAIR N/A 10/13/2023   Procedure: EXCISIONAL DEBRIDEMENT OF SKIN AND FASCIA;  Surgeon: Jordis Laneta FALCON, MD;  Location: [facility] ORS;  Service: General;  Laterality: N/A;  open procedure w/mesh   XI ROBOTIC ASSISTED PARAESOPHAGEAL HERNIA REPAIR N/A 01/20/2022   Procedure: XI ROBOTIC ASSISTED PARAESOPHAGEAL HERNIA REPAIR, CONVERTED TO OPEN, RNFA to assist;  Surgeon: Jordis Laneta FALCON, MD;  Location: [facility] ORS;  Service: General;  Laterality: N/A;       Home Medications    Prior to Admission medications   Medication Sig Start Date End Date Taking? Authorizing Provider  sulfamethoxazole -trimethoprim  (BACTRIM  DS) 800-160 MG tablet Take 2 tablets by mouth 2  (two) times daily for 7 days. Patient taking differently: Take 1 tablet by mouth 2 (two) times daily. 10/10/23 10/17/23 Yes Ray, Nilsa, MD  amitriptyline  (ELAVIL ) 10 MG tablet Take 30 mg by mouth at bedtime.    [provider]  ascorbic acid (VITAMIN C) 1000 MG tablet Take 1,000 mg by mouth daily.    [provider]  aspirin  EC 81 MG tablet Take 81 mg by mouth daily. Swallow whole.    [provider]  Budeson-Glycopyrrol-Formoterol  (BREZTRI  AEROSPHERE) 160-9-4.8 MCG/ACT AERO Inhale 2 puffs into the lungs in the morning and at bedtime.    [provider]  Clindamycin-Benzoyl Per, Refr, gel Apply 1 Application topically every morning. 09/01/23   [provider]  cyanocobalamin  (VITAMIN B12) 1000 MCG tablet Take 1,000 mcg by mouth daily.    [provider]  dapagliflozin  propanediol (FARXIGA ) 10 MG TABS tablet Take 1 tablet (10 mg total) by mouth daily before breakfast. 09/21/23   Melvin Pao, NP  ferrous sulfate  325 (65 FE) MG tablet Take 325 mg by mouth daily with breakfast.    [provider]  fluticasone  (FLONASE ) 50 MCG/ACT nasal spray Place 1 spray into both nostrils daily as needed for allergies. 07/02/21   [provider]  gabapentin  (NEURONTIN ) 600 MG tablet Take 600 mg by mouth 3 (three) times daily. 03/26/21   [provider]  hydrochlorothiazide  (HYDRODIURIL ) 25 MG tablet Take 1 tablet (25 mg total) by mouth daily. 08/11/23   Melvin Pao, NP  HYDROcodone -acetaminophen  (NORCO/VICODIN) 5-325 MG tablet Take 1 tablet by mouth 2 (two) times daily as needed. Must last 30 days. Patient taking differently: Take 1 tablet by mouth 2 (two) times daily. Must last 30 days. 09/24/23 10/24/23  Patel, Seema K, NP  HYDROcodone -acetaminophen  (NORCO/VICODIN) 5-325 MG tablet Take 1 tablet by mouth 2 (two) times daily as needed. Must last 30 days. 10/24/23 11/23/23  Patel, Seema K, NP  insulin  isophane & regular human (HUMULIN 70/30  KWIKPEN) (70-30) 100 UNIT/ML KwikPen Inject 42-82 Units into the skin 2 (two) times daily with a meal. 82 in the a.m. and 42u qhs 10/14/19   [provider]  ipratropium (ATROVENT ) 0.06 % nasal spray Place 2 sprays into both nostrils 4 (four) times daily. Patient not taking: Reported on 10/12/2023 05/12/23   Brimage, Vondra, DO  ipratropium-albuterol  (DUONEB) 0.5-2.5 (3) MG/3ML SOLN Take 3 mLs by nebulization every 4 (four) hours as needed (SOB/Wheezing/Asthma).    [provider]  lisinopril  (ZESTRIL ) 10 MG tablet Take 1 tablet (10 mg total) by mouth daily. Patient taking differently: Take 5 mg by mouth daily. 09/02/23   Melvin Pao, NP  lubiprostone  (AMITIZA ) 8 MCG capsule Take 8 mcg by mouth 2 (two) times daily with a meal.    [provider]  Magnesium  Oxide -Mg Supplement 500 MG TABS Take 500 mg by mouth daily. 06/27/22   [provider]  metFORMIN  (GLUCOPHAGE -XR) 500 MG 24 hr tablet Take 2 tablets (1,000 mg total) by mouth 2 (two) times daily with a meal. 10/08/23   Melvin Pao, NP  montelukast  (SINGULAIR ) 10 MG tablet Take 10 mg by mouth daily as needed (Allergies). 07/02/21   [provider]  MOUNJARO 10 MG/0.5ML Pen Inject 10 mg into the skin once a week. 07/23/23   [provider]  Multiple Vitamin (MULTIVITAMIN) tablet Take 1 tablet by mouth daily. Mega [employer]    [provider]  naloxone  (NARCAN ) nasal spray 4 mg/0.1 mL Place 1 spray into the nose as needed for up to 365 doses (for opioid-induced respiratory depresssion). In case of emergency (overdose), spray once into each nostril. If no response within 3 minutes, repeat application and call 911. 02/17/23 02/17/24  Tanya Glisson, MD  NEEDLE, DISP, 18 G (BD SAFETYGLIDE NEEDLE) 18G X 1-1/2 MISC 1 mg by Does not apply route every 7 (seven) days. 07/05/23   Helon Kirsch A, PA-C  NEEDLE, DISP, 21 G (BD SAFETYGLIDE NEEDLE) 21G X 1 MISC 1 mg by Does not apply route every 7  (seven) days. 07/05/23   Helon Kirsch A, PA-C  oxyCODONE  (OXY IR/ROXICODONE ) 5 MG immediate release tablet Take 1 tablet (5 mg total) by mouth every 6 (six) hours as needed for severe pain (pain score 7-10) or moderate pain (pain score 4-6). 10/13/23   Pabon, Diego F, MD  pantoprazole  (PROTONIX ) 40 MG tablet Take 40 mg by mouth 2 (two) times daily. 03/22/23   [provider]  PRESCRIPTION MEDICATION Pt has CPAP Machine    [provider]  PROAIR  HFA 108 (90 Base) MCG/ACT inhaler Inhale 2 puffs into the lungs every 6 (six) hours as needed for wheezing or shortness of breath. 12/28/20   [provider]  rosuvastatin  (CRESTOR ) 40 MG tablet TAKE 1 TABLET(40 MG) BY MOUTH DAILY Patient taking differently: Take 40 mg by mouth at bedtime. 09/21/22   Melvin Pao, NP  tazarotene  (AVAGE ) 0.1 % cream Apply topically at bedtime. qhs to face and chest for acne 06/03/23   Claudene Lehmann, MD  testosterone  cypionate (DEPOTESTOSTERONE CYPIONATE) 200 MG/ML injection Inject 1 cc every 10 days. 10/07/23   Helon Kirsch A, PA-C  VASCEPA 1 g capsule Take 2 g by mouth 2 (two) times daily.    [provider]    Family History Family History  Problem Relation Age of Onset   Lung cancer Mother 74   Cancer Mother    Colon cancer Father 55   Heart disease Father    Alcohol abuse Father    Cancer Father    Esophageal cancer Brother 88   Diabetes Brother    Cancer Brother    Kidney disease Brother    Heart disease Brother    Lung cancer Maternal Aunt    Cancer Paternal Aunt        unk type   Cancer Maternal Grandfather        unk type   Drug abuse Daughter     Social History Social History   Tobacco Use   Smoking status: Former    Current packs/day: 0.50    Average packs/day: 0.5 packs/day for 34.0 years (17.0 ttl pk-yrs)  Types: Cigarettes    Passive exposure: Past   Smokeless tobacco: Current  Vaping Use   Vaping status: Some Days   Substances:  Nicotine   Substance Use Topics   Alcohol use: Not Currently    Alcohol/week: 12.0 standard drinks of alcohol    Types: 12 Cans of beer per week    Comment: Quit February 2023   Drug use: Yes    Types: Hydrocodone      Allergies   Glipizide, Ciprofloxacin , Cephalexin , Duloxetine, and Dupixent [dupilumab]   Review of Systems Review of Systems Per HPI  Physical Exam Triage Vital Signs ED Triage Vitals  Encounter Vitals Group     BP 10/16/23 1213 116/75     Girls Systolic BP Percentile --      Girls Diastolic BP Percentile --      Boys Systolic BP Percentile --      Boys Diastolic BP Percentile --      Pulse Rate 10/16/23 1213 87     Resp 10/16/23 1213 16     Temp 10/16/23 1213 98.5 F (36.9 C)     Temp Source 10/16/23 1213 Oral     SpO2 10/16/23 1213 95 %     Weight 10/16/23 1211 238 lb 15.7 oz (108.4 kg)     Height 10/16/23 1211 6' 2 (1.88 m)     Head Circumference --      Peak Flow --      Pain Score 10/16/23 1211 3     Pain Loc --      Pain Education --      Exclude from Growth Chart --    No data found.  Updated Vital Signs BP 116/75 (BP Location: Left Arm)   Pulse 87   Temp 98.5 F (36.9 C) (Oral)   Resp 16   Ht 6' 2 (1.88 m)   Wt 108.4 kg   SpO2 95%   BMI 30.68 kg/m   Visual Acuity Right Eye Distance:   Left Eye Distance:   Bilateral Distance:    Right Eye Near:   Left Eye Near:    Bilateral Near:     Physical Exam Vitals and nursing note reviewed.  Constitutional:      General: He is not in acute distress.    Appearance: Normal appearance.  HENT:     Head: Normocephalic and atraumatic.  Pulmonary:     Effort: Pulmonary effort is normal. No respiratory distress.  Abdominal:     Comments: Midline abdominal incision with no surrounding erythema.  No current drainage.  No significant tenderness to palpation.  Neurological:     Mental Status: He is alert.      UC Treatments / Results  Labs (all labs ordered are listed, but only  abnormal results are displayed) Labs Reviewed - No data to display  EKG   Radiology No results found.  Procedures Procedures (including critical care time)  Medications Ordered in UC Medications - No data to display  Initial Impression / Assessment and Plan / UC Course  I have reviewed the triage vital signs and the nursing notes.  Pertinent labs & imaging results that were available during my care of the patient were reviewed by me and considered in my medical decision making (see chart for details).    58 year old male presents for evaluation of his wound.  Afebrile.  No significant drainage currently.  Discussed going to the hospital versus following up with her surgeon on Monday.  He is hemodynamically stable.  He  elects to follow-up with his surgeon on Monday.  Since his cultures have been negative, I see no indication to change antibiotic therapy.  He has underlying mesh and I believe that this is the source of his issue.  Final Clinical Impressions(s) / UC Diagnoses   Final diagnoses:  Visit for wound check     Discharge Instructions      Call surgeon on Monday.  If you worsen, go to the ER.  Take care  Dr. Bluford    ED Prescriptions   None    PDMP not reviewed this encounter.   Kathy Wares G, [place] 10/16/23 1541

## 2023-10-17 ENCOUNTER — Inpatient Hospital Stay
Admission: EM | Admit: 2023-10-17 | Discharge: 2023-10-20 | DRG: 908 | Disposition: A | Discharge status: Home / Self Care | Source: Ambulatory Visit | Attending: Surgery | Admitting: Surgery

## 2023-10-17 ENCOUNTER — Other Ambulatory Visit: Payer: Self-pay

## 2023-10-17 DIAGNOSIS — Z7984 Long term (current) use of oral hypoglycemic drugs: Secondary | ICD-10-CM

## 2023-10-17 DIAGNOSIS — E1142 Type 2 diabetes mellitus with diabetic polyneuropathy: Secondary | ICD-10-CM | POA: Diagnosis present

## 2023-10-17 DIAGNOSIS — L03311 Cellulitis of abdominal wall: Secondary | ICD-10-CM

## 2023-10-17 DIAGNOSIS — Z7985 Long-term (current) use of injectable non-insulin antidiabetic drugs: Secondary | ICD-10-CM

## 2023-10-17 DIAGNOSIS — F17290 Nicotine dependence, other tobacco product, uncomplicated: Secondary | ICD-10-CM | POA: Diagnosis present

## 2023-10-17 DIAGNOSIS — K432 Incisional hernia without obstruction or gangrene: Secondary | ICD-10-CM | POA: Diagnosis not present

## 2023-10-17 DIAGNOSIS — Z841 Family history of disorders of kidney and ureter: Secondary | ICD-10-CM

## 2023-10-17 DIAGNOSIS — Z801 Family history of malignant neoplasm of trachea, bronchus and lung: Secondary | ICD-10-CM

## 2023-10-17 DIAGNOSIS — Z833 Family history of diabetes mellitus: Secondary | ICD-10-CM

## 2023-10-17 DIAGNOSIS — M810 Age-related osteoporosis without current pathological fracture: Secondary | ICD-10-CM | POA: Diagnosis present

## 2023-10-17 DIAGNOSIS — K439 Ventral hernia without obstruction or gangrene: Secondary | ICD-10-CM | POA: Diagnosis present

## 2023-10-17 DIAGNOSIS — Z8249 Family history of ischemic heart disease and other diseases of the circulatory system: Secondary | ICD-10-CM

## 2023-10-17 DIAGNOSIS — G8929 Other chronic pain: Secondary | ICD-10-CM | POA: Diagnosis present

## 2023-10-17 DIAGNOSIS — E66811 Obesity, class 1: Secondary | ICD-10-CM | POA: Diagnosis present

## 2023-10-17 DIAGNOSIS — K760 Fatty (change of) liver, not elsewhere classified: Secondary | ICD-10-CM | POA: Diagnosis present

## 2023-10-17 DIAGNOSIS — Z79899 Other long term (current) drug therapy: Secondary | ICD-10-CM

## 2023-10-17 DIAGNOSIS — N200 Calculus of kidney: Secondary | ICD-10-CM | POA: Diagnosis present

## 2023-10-17 DIAGNOSIS — T85898A Other specified complication of other internal prosthetic devices, implants and grafts, initial encounter: Secondary | ICD-10-CM

## 2023-10-17 DIAGNOSIS — Z87442 Personal history of urinary calculi: Secondary | ICD-10-CM

## 2023-10-17 DIAGNOSIS — G8918 Other acute postprocedural pain: Secondary | ICD-10-CM | POA: Diagnosis present

## 2023-10-17 DIAGNOSIS — E785 Hyperlipidemia, unspecified: Secondary | ICD-10-CM | POA: Diagnosis present

## 2023-10-17 DIAGNOSIS — Z7982 Long term (current) use of aspirin: Secondary | ICD-10-CM

## 2023-10-17 DIAGNOSIS — Z811 Family history of alcohol abuse and dependence: Secondary | ICD-10-CM

## 2023-10-17 DIAGNOSIS — Z888 Allergy status to other drugs, medicaments and biological substances status: Secondary | ICD-10-CM

## 2023-10-17 DIAGNOSIS — I129 Hypertensive chronic kidney disease with stage 1 through stage 4 chronic kidney disease, or unspecified chronic kidney disease: Secondary | ICD-10-CM | POA: Diagnosis present

## 2023-10-17 DIAGNOSIS — Y831 Surgical operation with implant of artificial internal device as the cause of abnormal reaction of the patient, or of later complication, without mention of misadventure at the time of the procedure: Secondary | ICD-10-CM | POA: Diagnosis present

## 2023-10-17 DIAGNOSIS — Z79891 Long term (current) use of opiate analgesic: Secondary | ICD-10-CM

## 2023-10-17 DIAGNOSIS — I2510 Atherosclerotic heart disease of native coronary artery without angina pectoris: Secondary | ICD-10-CM | POA: Diagnosis present

## 2023-10-17 DIAGNOSIS — K219 Gastro-esophageal reflux disease without esophagitis: Secondary | ICD-10-CM | POA: Diagnosis present

## 2023-10-17 DIAGNOSIS — Z6830 Body mass index (BMI) 30.0-30.9, adult: Secondary | ICD-10-CM

## 2023-10-17 DIAGNOSIS — N182 Chronic kidney disease, stage 2 (mild): Secondary | ICD-10-CM | POA: Diagnosis present

## 2023-10-17 DIAGNOSIS — T8579XA Infection and inflammatory reaction due to other internal prosthetic devices, implants and grafts, initial encounter: Principal | ICD-10-CM | POA: Diagnosis present

## 2023-10-17 DIAGNOSIS — Z800 Family history of malignant neoplasm of digestive organs: Secondary | ICD-10-CM

## 2023-10-17 DIAGNOSIS — Z881 Allergy status to other antibiotic agents status: Secondary | ICD-10-CM

## 2023-10-17 DIAGNOSIS — Z79890 Hormone replacement therapy (postmenopausal): Secondary | ICD-10-CM

## 2023-10-17 DIAGNOSIS — K660 Peritoneal adhesions (postprocedural) (postinfection): Secondary | ICD-10-CM | POA: Diagnosis present

## 2023-10-17 DIAGNOSIS — N179 Acute kidney failure, unspecified: Secondary | ICD-10-CM | POA: Diagnosis present

## 2023-10-17 DIAGNOSIS — J4489 Other specified chronic obstructive pulmonary disease: Secondary | ICD-10-CM | POA: Diagnosis present

## 2023-10-17 DIAGNOSIS — E1122 Type 2 diabetes mellitus with diabetic chronic kidney disease: Secondary | ICD-10-CM | POA: Diagnosis present

## 2023-10-17 DIAGNOSIS — L02211 Cutaneous abscess of abdominal wall: Secondary | ICD-10-CM | POA: Diagnosis not present

## 2023-10-17 DIAGNOSIS — E1151 Type 2 diabetes mellitus with diabetic peripheral angiopathy without gangrene: Secondary | ICD-10-CM | POA: Diagnosis present

## 2023-10-17 DIAGNOSIS — J439 Emphysema, unspecified: Secondary | ICD-10-CM | POA: Diagnosis present

## 2023-10-17 DIAGNOSIS — K5730 Diverticulosis of large intestine without perforation or abscess without bleeding: Secondary | ICD-10-CM | POA: Diagnosis present

## 2023-10-17 DIAGNOSIS — Z555 Less than a high school diploma: Secondary | ICD-10-CM

## 2023-10-17 LAB — CBC
HCT: 37.3 % — ABNORMAL LOW (ref 39.0–52.0)
Hemoglobin: 11.8 g/dL — ABNORMAL LOW (ref 13.0–17.0)
MCH: 24.3 pg — ABNORMAL LOW (ref 26.0–34.0)
MCHC: 31.6 g/dL (ref 30.0–36.0)
MCV: 76.7 fL — ABNORMAL LOW (ref 80.0–100.0)
Platelets: 230 K/uL (ref 150–400)
RBC: 4.86 MIL/uL (ref 4.22–5.81)
RDW: 17.4 % — ABNORMAL HIGH (ref 11.5–15.5)
WBC: 6.9 K/uL (ref 4.0–10.5)
nRBC: 0 % (ref 0.0–0.2)

## 2023-10-17 LAB — AEROBIC CULTURE W GRAM STAIN (SUPERFICIAL SPECIMEN): Culture: NO GROWTH

## 2023-10-17 LAB — BASIC METABOLIC PANEL WITH GFR
Anion gap: 10 (ref 5–15)
BUN: 19 mg/dL (ref 6–20)
CO2: 24 mmol/L (ref 22–32)
Calcium: 9.3 mg/dL (ref 8.9–10.3)
Chloride: 100 mmol/L (ref 98–111)
Creatinine, Ser: 1.74 mg/dL — ABNORMAL HIGH (ref 0.61–1.24)
GFR, Estimated: 44 mL/min — ABNORMAL LOW (ref >60)
Glucose, Bld: 234 mg/dL — ABNORMAL HIGH (ref 70–99)
Potassium: 4.1 mmol/L (ref 3.5–5.1)
Sodium: 134 mmol/L — ABNORMAL LOW (ref 135–145)

## 2023-10-17 LAB — GLUCOSE, CAPILLARY
Glucose-Capillary: 136 mg/dL — ABNORMAL HIGH (ref 70–99)
Glucose-Capillary: 162 mg/dL — ABNORMAL HIGH (ref 70–99)
Glucose-Capillary: 118 mg/dL — ABNORMAL HIGH (ref 70–99)

## 2023-10-17 LAB — CBG MONITORING, ED: Glucose-Capillary: 149 mg/dL — ABNORMAL HIGH (ref 70–99)

## 2023-10-17 MED ORDER — VANCOMYCIN HCL IN DEXTROSE 1-5 GM/200ML-% IV SOLN: 1000.0000 mg | Freq: Once | INTRAVENOUS | Status: DC

## 2023-10-17 MED ORDER — MORPHINE SULFATE (PF) 4 MG/ML IV SOLN
4.0000 mg | INTRAVENOUS | Status: DC | PRN
  Administered 2023-10-17 – 2023-10-20 (×7): 4 mg via INTRAVENOUS
  Filled 2023-10-17 (×7): qty 1

## 2023-10-17 MED ORDER — DAPAGLIFLOZIN PROPANEDIOL 10 MG PO TABS
10.0000 mg | ORAL_TABLET | Freq: Every day | ORAL | Status: DC
  Administered 2023-10-18: 10 mg via ORAL
  Filled 2023-10-17: qty 1

## 2023-10-17 MED ORDER — ONDANSETRON 4 MG PO TBDP
4.0000 mg | ORAL_TABLET | Freq: Four times a day (QID) | ORAL | Status: DC | PRN
Start: 2023-10-17 — End: 2023-10-20

## 2023-10-17 MED ORDER — HYDROCHLOROTHIAZIDE 25 MG PO TABS
25.0000 mg | ORAL_TABLET | Freq: Every day | ORAL | Status: DC
  Administered 2023-10-17: 25 mg via ORAL
  Filled 2023-10-17: qty 1

## 2023-10-17 MED ORDER — PIPERACILLIN-TAZOBACTAM 3.375 G IVPB
3.3750 g | Freq: Three times a day (TID) | INTRAVENOUS | Status: DC
  Administered 2023-10-17 – 2023-10-20 (×8): 3.375 g via INTRAVENOUS
  Filled 2023-10-17 (×8): qty 50

## 2023-10-17 MED ORDER — LUBIPROSTONE 8 MCG PO CAPS
8.0000 ug | ORAL_CAPSULE | Freq: Two times a day (BID) | ORAL | Status: DC
  Administered 2023-10-18 – 2023-10-20 (×3): 8 ug via ORAL
  Filled 2023-10-17 (×8): qty 1

## 2023-10-17 MED ORDER — ONDANSETRON HCL 4 MG/2ML IJ SOLN: 4.0000 mg | Freq: Four times a day (QID) | INTRAMUSCULAR | Status: DC | PRN

## 2023-10-17 MED ORDER — PIPERACILLIN-TAZOBACTAM 3.375 G IVPB
3.3750 g | Freq: Once | INTRAVENOUS | Status: AC
  Administered 2023-10-17: 3.375 g via INTRAVENOUS
  Filled 2023-10-17: qty 50

## 2023-10-17 MED ORDER — ROSUVASTATIN CALCIUM 10 MG PO TABS
40.0000 mg | ORAL_TABLET | Freq: Every day | ORAL | Status: DC
  Administered 2023-10-17 – 2023-10-19 (×3): 40 mg via ORAL
  Filled 2023-10-17 (×3): qty 4

## 2023-10-17 MED ORDER — VANCOMYCIN HCL IN DEXTROSE 1-5 GM/200ML-% IV SOLN: 1000.0000 mg | Freq: Two times a day (BID) | INTRAVENOUS | Status: DC

## 2023-10-17 MED ORDER — ACETAMINOPHEN 650 MG RE SUPP: 650.0000 mg | Freq: Four times a day (QID) | RECTAL | Status: DC | PRN

## 2023-10-17 MED ORDER — VANCOMYCIN HCL 1750 MG/350ML IV SOLN
1750.0000 mg | INTRAVENOUS | Status: DC
  Filled 2023-10-17: qty 350

## 2023-10-17 MED ORDER — GABAPENTIN 300 MG PO CAPS
600.0000 mg | ORAL_CAPSULE | Freq: Three times a day (TID) | ORAL | Status: DC
  Administered 2023-10-17 – 2023-10-18 (×4): 600 mg via ORAL
  Filled 2023-10-17 (×4): qty 2

## 2023-10-17 MED ORDER — IPRATROPIUM-ALBUTEROL 0.5-2.5 (3) MG/3ML IN SOLN: 3.0000 mL | RESPIRATORY_TRACT | Status: DC | PRN

## 2023-10-17 MED ORDER — INSULIN ASPART 100 UNIT/ML IJ SOLN
0.0000 [IU] | Freq: Every day | INTRAMUSCULAR | Status: DC
  Administered 2023-10-19: 2 [IU] via SUBCUTANEOUS
  Filled 2023-10-17 (×2): qty 1

## 2023-10-17 MED ORDER — LISINOPRIL 10 MG PO TABS
5.0000 mg | ORAL_TABLET | Freq: Every day | ORAL | Status: DC
  Filled 2023-10-17: qty 1

## 2023-10-17 MED ORDER — PANTOPRAZOLE SODIUM 40 MG PO TBEC
40.0000 mg | DELAYED_RELEASE_TABLET | Freq: Two times a day (BID) | ORAL | Status: DC
  Administered 2023-10-17 – 2023-10-20 (×7): 40 mg via ORAL
  Filled 2023-10-17 (×7): qty 1

## 2023-10-17 MED ORDER — BUDESON-GLYCOPYRROL-FORMOTEROL 160-9-4.8 MCG/ACT IN AERO
2.0000 | INHALATION_SPRAY | Freq: Two times a day (BID) | RESPIRATORY_TRACT | Status: DC
  Administered 2023-10-17 – 2023-10-20 (×6): 2 via RESPIRATORY_TRACT
  Filled 2023-10-17: qty 5.9

## 2023-10-17 MED ORDER — AMITRIPTYLINE HCL 10 MG PO TABS
30.0000 mg | ORAL_TABLET | Freq: Every day | ORAL | Status: DC
  Administered 2023-10-17 – 2023-10-19 (×3): 30 mg via ORAL
  Filled 2023-10-17 (×3): qty 3

## 2023-10-17 MED ORDER — HYDROCODONE-ACETAMINOPHEN 5-325 MG PO TABS
1.0000 | ORAL_TABLET | ORAL | Status: DC | PRN
  Administered 2023-10-17 – 2023-10-18 (×4): 2 via ORAL
  Filled 2023-10-17 (×5): qty 2

## 2023-10-17 MED ORDER — ACETAMINOPHEN 325 MG PO TABS: 650.0000 mg | ORAL_TABLET | Freq: Four times a day (QID) | ORAL | Status: DC | PRN

## 2023-10-17 MED ORDER — VANCOMYCIN HCL 2000 MG/400ML IV SOLN
2000.0000 mg | Freq: Once | INTRAVENOUS | Status: AC
  Administered 2023-10-17: 2000 mg via INTRAVENOUS
  Filled 2023-10-17: qty 400

## 2023-10-17 MED ORDER — INSULIN ASPART 100 UNIT/ML IJ SOLN
0.0000 [IU] | Freq: Three times a day (TID) | INTRAMUSCULAR | Status: DC
  Administered 2023-10-17: 3 [IU] via SUBCUTANEOUS
  Administered 2023-10-17 – 2023-10-18 (×4): 2 [IU] via SUBCUTANEOUS
  Administered 2023-10-20: 3 [IU] via SUBCUTANEOUS
  Filled 2023-10-17 (×4): qty 1
  Filled 2023-10-17: qty 2
  Filled 2023-10-17 (×2): qty 1

## 2023-10-17 MED ORDER — ENOXAPARIN SODIUM 40 MG/0.4ML IJ SOSY
40.0000 mg | PREFILLED_SYRINGE | INTRAMUSCULAR | Status: DC
  Administered 2023-10-17 – 2023-10-18 (×2): 40 mg via SUBCUTANEOUS
  Filled 2023-10-17 (×3): qty 0.4

## 2023-10-17 MED ORDER — ALBUTEROL SULFATE (2.5 MG/3ML) 0.083% IN NEBU: 3.0000 mL | INHALATION_SOLUTION | Freq: Four times a day (QID) | RESPIRATORY_TRACT | Status: DC | PRN

## 2023-10-17 NOTE — Plan of Care (Signed)

## 2023-10-17 NOTE — Progress Notes (Signed)
 Pharmacy Antibiotic Note  Darren Allen is a 58 y.o. male admitted on 10/17/2023 with wound infection. Pharmacy has been consulted for Vancomycin  dosing. Patient is also receiving zosyn .   Plan: Patient received vancomycin  2g IV x 1 dose in ED.   Start Vancomycin  1750 mg IV Q 24 hrs. Goal AUC 400-550. Expected AUC: 467 SCr used: 1.74 Vd: 0.72   Pharmacy will monitor renal function and adjust dose as needed.    Height: 6' 2 (188 cm) Weight: 108.4 kg (238 lb 15.7 oz) IBW/kg (Calculated) : 82.2  Temp (24hrs), Avg:98.2 F (36.8 C), Min:97.9 F (36.6 C), Max:98.5 F (36.9 C)  Recent Labs  Lab 10/10/23 1840 10/17/23 0859  WBC 6.5 6.9  CREATININE 1.48* 1.74*  LATICACIDVEN 1.3  --     Estimated Creatinine Clearance: 59.2 mL/min (A) (by C-G formula based on SCr of 1.74 mg/dL (H)).    Allergies  Allergen Reactions   Glipizide Other (See Comments) and Palpitations    Shaky, feel bad Other reaction(s): Dizziness   Ciprofloxacin  Itching    Caused itching and burning    Cephalexin  Rash    Received IV rocephin , zosyn  w/o issues   Duloxetine Anxiety and Nausea Only   Dupixent [Dupilumab] Rash    Antimicrobials this admission: 8/3 Zosyn   >>  8/3 Vancomycin  >>   Dose adjustments this admission:   Microbiology results: N/A  Thank you for allowing pharmacy to be a part of this patient's care.  Estill CHRISTELLA Lutes, PharmD, BCPS Clinical Pharmacist 10/17/2023 11:24 AM

## 2023-10-17 NOTE — ED Provider Notes (Signed)
 [facility] Provider Note    Event Date/Time   First MD Initiated Contact with Patient 10/17/23 0827     (approximate)   History   Post-op Problem   HPI  Darren Allen is a 58 y.o. male past medical history significant for diabetes, hypertension, recurrent abdominal wall abscess and cellulitis who presents to the emergency department with concern for postoperative infection.  Patient had a history of hernia repair with complications, required mesh previously.  Patient has had recurrent abdominal wall abscesses and cellulitis and has been on multiple rounds of antibiotics.  Patient is currently on Bactrim  and completed a course of metronidazole .  Patient had excisional debridement of skin and fascia on 10/13/2023 with Dr. Jordis.  States over the past 3 days has had significant purulent drainage from his incision site.  Evaluated urgent care yesterday and told to follow-up on Monday or return to the emergency department if he had worsening drainage.  Overnight states that he continued to have drainage from the site.  Denies fever or chills.  In review of op note patient had chronic 2 mm sinus from the skin to the subcutaneous, debridement performed, no mesh was deep to the inflammatory process and no deep track, mesh was well incorporated.  They felt that it was a superficial inflammation and obtained cultures at that time.     Physical Exam   Triage Vital Signs: ED Triage Vitals [10/17/23 0826]  Encounter Vitals Group     BP (!) 125/91     Girls Systolic BP Percentile      Girls Diastolic BP Percentile      Boys Systolic BP Percentile      Boys Diastolic BP Percentile      Pulse Rate 90     Resp 17     Temp 97.9 F (36.6 C)     Temp src      SpO2 100 %     Weight 238 lb 15.7 oz (108.4 kg)     Height 6' 2 (1.88 m)     Head Circumference      Peak Flow      Pain Score 5     Pain Loc      Pain Education      Exclude from Growth Chart     Most  recent vital signs: Vitals:   10/17/23 0902 10/17/23 0903  BP: 120/61   Pulse:  90  Resp:    Temp:    SpO2:  100%    Physical Exam Constitutional:      Appearance: He is well-developed.  HENT:     Head: Atraumatic.  Eyes:     Conjunctiva/sclera: Conjunctivae normal.  Cardiovascular:     Rate and Rhythm: Regular rhythm.  Pulmonary:     Effort: No respiratory distress.  Abdominal:     Tenderness: There is abdominal tenderness.     Comments: Incision to the abdominal wall with mild purulent drainage.  Mild erythema.  No surrounding warmth or crepitance, mild underlying tenderness to palpation no obvious palpable abscess  Musculoskeletal:     Cervical back: Normal range of motion.  Skin:    General: Skin is warm.  Neurological:     Mental Status: He is alert. Mental status is at baseline.          IMPRESSION / MDM / ASSESSMENT AND PLAN / ED COURSE  I reviewed the triage vital signs and the nursing notes.  Differential diagnosis including cellulitis, abscess,  mesh infection  Plan for lab work and to discuss with surgery team -discussed with Dr. Cesar LEOS (all labs ordered are listed, but only abnormal results are displayed) Labs interpreted as -    Labs Reviewed  CBC - Abnormal; Notable for the following components:      Result Value   Hemoglobin 11.8 (*)    HCT 37.3 (*)    MCV 76.7 (*)    MCH 24.3 (*)    RDW 17.4 (*)    All other components within normal limits  BASIC METABOLIC PANEL WITH GFR - Abnormal; Notable for the following components:   Sodium 134 (*)    Glucose, Bld 234 (*)    Creatinine, Ser 1.74 (*)    GFR, Estimated 44 (*)    All other components within normal limits     MDM   Clinical Course as of 10/17/23 0939  Sun Oct 17, 2023  0921 Discussed with Dr. Cesar who recommended admission with IV antibiotics.  No repeat imaging at this time.  Recommended IV vancomycin  and Zosyn .  Does have an allergy to Keflex , will discuss with  pharmacy team appropriate antibiotics.  Does not meet criteria for sepsis and do not feel that blood cultures or lactic acid is necessary. [SM]  [phone] Discussed with pharmacy was confirmed that the patient has had other penicillins and cephalosporins in the past.  Will give IV vancomycin  and Zosyn  and admit to the surgery team. [SM]    Clinical Course User Index [SM] Suzanne Kirsch, MD     PROCEDURES:  Critical Care performed: No  Procedures  Patient's presentation is most consistent with acute complicated illness / injury requiring diagnostic workup.   MEDICATIONS ORDERED IN ED: Medications  vancomycin  (VANCOCIN ) IVPB 1000 mg/200 mL premix (has no administration in time range)  piperacillin -tazobactam (ZOSYN ) IVPB 3.375 g (has no administration in time range)    FINAL CLINICAL IMPRESSION(S) / ED DIAGNOSES   Final diagnoses:  Post-op pain  Abdominal wall cellulitis     Rx / DC Orders   ED Discharge Orders     None        Note:  This document was prepared using Dragon voice recognition software and may include unintentional dictation errors.   Suzanne Kirsch, MD 10/17/23 [phone]

## 2023-10-17 NOTE — ED Triage Notes (Addendum)
 Pt comes with possible post op infection. Pt states he had abdominal surgery on Wed. Pt states drainage yellow and pus thru the bandage. Pt states he called his doctor and was advised to go to UC. Pt went to UC and then advised to come here.   Pt states no fevers but did have some diarrhea.

## 2023-10-17 NOTE — H&P (Signed)
 SURGICAL HISTORY AND PHYSICAL NOTE   HISTORY OF PRESENT ILLNESS (HPI):  58 y.o. male presented to [facility] ED for evaluation of copious drainage per wound.  Patient with history of infected mesh for at least 4 months.  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  He was discharged on Bactrim .  He presented 2 days ago to the urgent care with copious drainage and he was recommended to follow-up on Monday with his surgeon or present to the ED if any worsening.  24 hours later he presented to the to the emergency room due to copious drainage through the wound.  He endorses that the drainage is still copious at night it messed up his bed sheets and mattress.  He endorses periumbilical pain.  No pain radiation.  Pain aggravated by applying pressure.  There has been no alleviating factors.  He denies any fever.  I personally reviewed the operation report.  It was decided not to remove the mesh during surgery due to a significant area of chronic granuloma with significant inflammation from Ethibond stitch.  On physical exam patient with copious cream-colored drainage from the top of the wound.  Minimal erythema superficially.  He endorses that this is the same type of drainage has been going on for 37-month.  Surgery is consulted by Dr. Burt in this context for evaluation and management of chronic infection of implanted mesh.  PAST MEDICAL HISTORY (PMH):  Past Medical History:  Diagnosis Date   Allergy    Anxiety    Asthma    Chronic kidney disease    Chronic lower back pain    a.) followed by pain management; on COT   COPD (chronic obstructive pulmonary disease) (HCC)    Coronary artery disease    a.) cCTA 06/30/2021: Ca2+ = 216 (84th %ile; 25049% pRCA and pLAD))   DDD (degenerative disc disease), lumbosacral    Depression    Diabetic peripheral neuropathy (HCC)    Diastolic dysfunction 12/27/2020   a.) TTE  12/27/2020: EF >55%, no RWMAs, G1DD, norm RVSF, triv MR/TR?PR   Diverticulosis    Emphysema of lung (HCC)    Erectile dysfunction    a.) on PDE5i (tadalafil )   GERD (gastroesophageal reflux disease)    Hepatic steatosis    History of kidney stones    Hyperlipidemia    Hypertension    Hypogonadism male    a.) on exogenous TRT (depotestosterone cypionate)   IDA (iron deficiency anemia)    Long term current use of opiate analgesic    a.) followed by pain management; naloxone  Rx available   OSA on CPAP    Osteoporosis    Paraesophageal hernia    a.) s/p robotic assisted repair 01/2022   PVD (peripheral vascular disease) (HCC)    Sleep apnea    T2DM (type 2 diabetes mellitus) (HCC)    Umbilical hernia    a.) s/p repair 01/20/2022   Ventral hernia      PAST SURGICAL HISTORY (PSH):  Past Surgical History:  Procedure Laterality Date   ABDOMINAL HYSTERECTOMY     APPENDECTOMY     BRONCHOSCOPY  2016   CIRCUMCISION     COLONOSCOPY WITH PROPOFOL  N/A 12/31/2021   Procedure: COLONOSCOPY WITH PROPOFOL ;  Surgeon: Therisa Bi, MD;  Location: [facility] ENDOSCOPY;  Service: Gastroenterology;  Laterality: N/A;   ESOPHAGOGASTRODUODENOSCOPY N/A 12/31/2021   Procedure: ESOPHAGOGASTRODUODENOSCOPY (EGD);  Surgeon: Therisa Bi, MD;  Location: [facility] ENDOSCOPY;  Service: Gastroenterology;  Laterality: N/A;   HERNIA REPAIR  07/2023   INCISION AND DRAINAGE PERIRECTAL ABSCESS N/A 02/04/2015   Procedure: IRRIGATION AND DEBRIDEMENT PERIRECTAL ABSCESS;  Surgeon: Lonni Brands, MD;  Location: [facility] ORS;  Service: General;  Laterality: N/A;   INSERTION OF MESH  01/20/2022   Procedure: INSERTION OF MESH;  Surgeon: Jordis Laneta FALCON, MD;  Location: [facility] ORS;  Service: General;;   INSERTION OF MESH N/A 04/08/2023   Procedure: INSERTION OF MESH;  Surgeon: Jordis Laneta FALCON, MD;  Location: [facility] ORS;  Service: General;  Laterality: N/A;   KIDNEY STONE SURGERY Right    LEFT HEART CATH AND CORONARY ANGIOGRAPHY Left  08/16/2023   Procedure: LEFT HEART CATH AND CORONARY ANGIOGRAPHY;  Surgeon: Florencio Cara BIRCH, MD;  Location: [facility] INVASIVE CV LAB;  Service: Cardiovascular;  Laterality: Left;   lung mass removal N/A    MUSCLE BIOPSY     ORIF ANKLE FRACTURE Left 03/23/2017   Procedure: OPEN REDUCTION INTERNAL FIXATION (ORIF) ANKLE FRACTURE;  Surgeon: Tobie Priest, MD;  Location: [facility] ORS;  Service: Orthopedics;  Laterality: Left;   RECTAL EXAM UNDER ANESTHESIA  02/04/2015   Procedure: RECTAL EXAM UNDER ANESTHESIA;  Surgeon: Lonni Brands, MD;  Location: [facility] ORS;  Service: General;;   SYNDESMOSIS REPAIR Left 03/23/2017   Procedure: SYNDESMOSIS REPAIR;  Surgeon: Tobie Priest, MD;  Location: [facility] ORS;  Service: Orthopedics;  Laterality: Left;   UMBILICAL HERNIA REPAIR  01/20/2022   Procedure: HERNIA REPAIR UMBILICAL ADULT;  Surgeon: Jordis Laneta FALCON, MD;  Location: [facility] ORS;  Service: General;;   VASECTOMY     VENTRAL HERNIA REPAIR N/A 04/08/2023   Procedure: HERNIA REPAIR VENTRAL ADULT, open;  Surgeon: Jordis Laneta FALCON, MD;  Location: [facility] ORS;  Service: General;  Laterality: N/A;   VENTRAL HERNIA REPAIR N/A 10/13/2023   Procedure: EXCISIONAL DEBRIDEMENT OF SKIN AND FASCIA;  Surgeon: Jordis Laneta FALCON, MD;  Location: [facility] ORS;  Service: General;  Laterality: N/A;  open procedure w/mesh   XI ROBOTIC ASSISTED PARAESOPHAGEAL HERNIA REPAIR N/A 01/20/2022   Procedure: XI ROBOTIC ASSISTED PARAESOPHAGEAL HERNIA REPAIR, CONVERTED TO OPEN, RNFA to assist;  Surgeon: Jordis Laneta FALCON, MD;  Location: [facility] ORS;  Service: General;  Laterality: N/A;     MEDICATIONS:  Prior to Admission medications   Medication Sig Start Date End Date Taking? Authorizing Provider  amitriptyline  (ELAVIL ) 10 MG tablet Take 30 mg by mouth at bedtime.   Yes [provider]  ascorbic acid (VITAMIN C) 1000 MG tablet Take 1,000 mg by mouth daily.   Yes [provider]  aspirin  EC 81 MG tablet Take 81 mg by mouth daily. Swallow  whole.   Yes [provider]  Budeson-Glycopyrrol-Formoterol  (BREZTRI  AEROSPHERE) 160-9-4.8 MCG/ACT AERO Inhale 2 puffs into the lungs in the morning and at bedtime.   Yes [provider]  cyanocobalamin  (VITAMIN B12) 1000 MCG tablet Take 1,000 mcg by mouth daily.   Yes [provider]  dapagliflozin  propanediol (FARXIGA ) 10 MG TABS tablet Take 1 tablet (10 mg total) by mouth daily before breakfast. 09/21/23  Yes Melvin Pao, NP  ferrous sulfate  325 (65 FE) MG tablet Take 325 mg by mouth daily with breakfast.   Yes [provider]  fluticasone  (FLONASE ) 50 MCG/ACT nasal spray Place 1 spray into both nostrils daily as needed for allergies. 07/02/21  Yes [provider]  gabapentin  (NEURONTIN ) 600 MG tablet Take 600 mg by mouth 3 (three) times daily. 03/26/21  Yes [provider]  hydrochlorothiazide  (  HYDRODIURIL ) 25 MG tablet Take 1 tablet (25 mg total) by mouth daily. 08/11/23  Yes Melvin Pao, NP  HYDROcodone -acetaminophen  (NORCO/VICODIN) 5-325 MG tablet Take 1 tablet by mouth 2 (two) times daily as needed. Must last 30 days. Patient taking differently: Take 1 tablet by mouth 2 (two) times daily. Must last 30 days. 09/24/23 10/24/23 Yes Patel, Seema K, NP  insulin  isophane & regular human (HUMULIN 70/30 KWIKPEN) (70-30) 100 UNIT/ML KwikPen Inject 42-82 Units into the skin 2 (two) times daily with a meal. 82 in the a.m. and 42u qhs 10/14/19  Yes [provider]  ipratropium-albuterol  (DUONEB) 0.5-2.5 (3) MG/3ML SOLN Take 3 mLs by nebulization every 4 (four) hours as needed (SOB/Wheezing/Asthma).   Yes [provider]  lisinopril  (ZESTRIL ) 10 MG tablet Take 1 tablet (10 mg total) by mouth daily. Patient taking differently: Take 5 mg by mouth daily. 09/02/23  Yes Melvin Pao, NP  lubiprostone  (AMITIZA ) 8 MCG capsule Take 8 mcg by mouth 2 (two) times daily with a meal.   Yes [provider]  Magnesium  Oxide -Mg  Supplement 500 MG TABS Take 500 mg by mouth daily. 06/27/22  Yes [provider]  metFORMIN  (GLUCOPHAGE -XR) 500 MG 24 hr tablet Take 2 tablets (1,000 mg total) by mouth 2 (two) times daily with a meal. 10/08/23  Yes Melvin Pao, NP  montelukast  (SINGULAIR ) 10 MG tablet Take 10 mg by mouth daily as needed (Allergies). 07/02/21  Yes [provider]  MOUNJARO 10 MG/0.5ML Pen Inject 10 mg into the skin once a week. 07/23/23  Yes [provider]  Multiple Vitamin (MULTIVITAMIN) tablet Take 1 tablet by mouth daily. Mega Men   Yes [provider]  pantoprazole  (PROTONIX ) 40 MG tablet Take 40 mg by mouth 2 (two) times daily. 03/22/23  Yes [provider]  PROAIR  HFA 108 (90 Base) MCG/ACT inhaler Inhale 2 puffs into the lungs every 6 (six) hours as needed for wheezing or shortness of breath. 12/28/20  Yes [provider]  rosuvastatin  (CRESTOR ) 40 MG tablet TAKE 1 TABLET(40 MG) BY MOUTH DAILY Patient taking differently: Take 40 mg by mouth at bedtime. 09/21/22  Yes Melvin Pao, NP  testosterone  cypionate (DEPOTESTOSTERONE CYPIONATE) 200 MG/ML injection Inject 1 cc every 10 days. 10/07/23  Yes McGowan, Clotilda A, PA-C  VASCEPA 1 g capsule Take 2 g by mouth 2 (two) times daily.   Yes [provider]  Clindamycin-Benzoyl Per, Refr, gel Apply 1 Application topically every morning. 09/01/23   [provider]  HYDROcodone -acetaminophen  (NORCO/VICODIN) 5-325 MG tablet Take 1 tablet by mouth 2 (two) times daily as needed. Must last 30 days. 10/24/23 11/23/23  Patel, Seema K, NP  ipratropium (ATROVENT ) 0.06 % nasal spray Place 2 sprays into both nostrils 4 (four) times daily. Patient not taking: Reported on 10/12/2023 05/12/23   Brimage, Vondra, DO  naloxone  (NARCAN ) nasal spray 4 mg/0.1 mL Place 1 spray into the nose as needed for up to 365 doses (for opioid-induced respiratory depresssion). In case of emergency (overdose), spray once into each  nostril. If no response within 3 minutes, repeat application and call 911. 02/17/23 02/17/24  Tanya Glisson, MD  NEEDLE, DISP, 18 G (BD SAFETYGLIDE NEEDLE) 18G X 1-1/2 MISC 1 mg by Does not apply route every 7 (seven) days. 07/05/23   Helon Clotilda A, PA-C  NEEDLE, DISP, 21 G (BD SAFETYGLIDE NEEDLE) 21G X 1 MISC 1 mg by Does not apply route every 7 (seven) days. 07/05/23   Helon Clotilda LABOR,  PA-C  oxyCODONE  (OXY IR/ROXICODONE ) 5 MG immediate release tablet Take 1 tablet (5 mg total) by mouth every 6 (six) hours as needed for severe pain (pain score 7-10) or moderate pain (pain score 4-6). Patient not taking: Reported on 10/17/2023 10/13/23   Jordis Laneta FALCON, MD  PRESCRIPTION MEDICATION Pt has CPAP Machine    [provider]  sulfamethoxazole -trimethoprim  (BACTRIM  DS) 800-160 MG tablet Take 2 tablets by mouth 2 (two) times daily for 7 days. Patient not taking: Reported on 10/17/2023 10/10/23 10/17/23  Levander Slate, MD  tazarotene  (AVAGE ) 0.1 % cream Apply topically at bedtime. qhs to face and chest for acne 06/03/23   Claudene Lehmann, MD     ALLERGIES:  Allergies  Allergen Reactions   Glipizide Other (See Comments) and Palpitations    Shaky, feel bad Other reaction(s): Dizziness   Ciprofloxacin  Itching    Caused itching and burning    Cephalexin  Rash    Received IV rocephin , zosyn  w/o issues   Duloxetine Anxiety and Nausea Only   Dupixent [Dupilumab] Rash     SOCIAL HISTORY:  Social History   Socioeconomic History   Marital status: Single    Spouse name: Not on file   Number of children: Not on file   Years of education: Not on file   Highest education level: 8th grade  Occupational History   Not on file  Tobacco Use   Smoking status: Former    Current packs/day: 0.50    Average packs/day: 0.5 packs/day for 34.0 years (17.0 ttl pk-yrs)    Types: Cigarettes    Passive exposure: Past   Smokeless tobacco: Current  Vaping Use   Vaping status: Some Days   Substances:  Nicotine   Substance and Sexual Activity   Alcohol use: Not Currently    Alcohol/week: 12.0 standard drinks of alcohol    Types: 12 Cans of beer per week    Comment: Quit February 2023   Drug use: Yes    Types: Hydrocodone    Sexual activity: Yes  Other Topics Concern   Not on file  Social History Narrative   Not on file   Social Drivers of Health   Financial Resource Strain: Low Risk  (06/07/2023)   Overall Financial Resource Strain (CARDIA)    Difficulty of Paying Living Expenses: Not hard at all  Food Insecurity: No Food Insecurity (07/28/2023)   Hunger Vital Sign    Worried About Running Out of Food in the Last Year: Never true    Ran Out of Food in the Last Year: Never true  Transportation Needs: No Transportation Needs (07/28/2023)   PRAPARE - Administrator, Civil Service (Medical): No    Lack of Transportation (Non-Medical): No  Physical Activity: Inactive (06/07/2023)   Exercise Vital Sign    Days of Exercise per Week: 0 days    Minutes of Exercise per Session: 0 min  Stress: No Stress Concern Present (06/07/2023)   [employer] of Occupational Health - Occupational Stress Questionnaire    Feeling of Stress : Only a little  Social Connections: Moderately Isolated (07/28/2023)   Social Connection and Isolation Panel    Frequency of Communication with Friends and Family: Three times a week    Frequency of Social Gatherings with Friends and Family: Twice a week    Attends Religious Services: Never    Database administrator or Organizations: No    Attends Banker Meetings: Never    Marital Status: Living with partner  Intimate Partner Violence: Not At Risk (07/28/2023)   Humiliation, Afraid, Rape, and Kick questionnaire    Fear of Current or Ex-Partner: No    Emotionally Abused: No    Physically Abused: No    Sexually Abused: No      FAMILY HISTORY:  Family History  Problem Relation Age of Onset   Lung cancer Mother 39   Cancer Mother     Colon cancer Father 65   Heart disease Father    Alcohol abuse Father    Cancer Father    Esophageal cancer Brother 18   Diabetes Brother    Cancer Brother    Kidney disease Brother    Heart disease Brother    Lung cancer Maternal Aunt    Cancer Paternal Aunt        unk type   Cancer Maternal Grandfather        unk type   Drug abuse Daughter      REVIEW OF SYSTEMS:  Constitutional: denies weight loss, fever, chills, or sweats  Eyes: denies any other vision changes, history of eye injury  ENT: denies sore throat, hearing problems  Respiratory: denies shortness of breath, wheezing  Cardiovascular: denies chest pain, palpitations  Gastrointestinal: Positive abdominal pain Genitourinary: denies burning with urination or urinary frequency Musculoskeletal: denies any other joint pains or cramps  Skin: denies any other rashes or skin discolorations  Neurological: denies any other headache, dizziness, weakness  Psychiatric: denies any other depression, anxiety   All other review of systems were negative   VITAL SIGNS:  Temp:  [97.9 F (36.6 C)-98.5 F (36.9 C)] 97.9 F (36.6 C) (08/03 0826) Pulse Rate:  [87-90] 90 (08/03 0903) Resp:  [16-17] 17 (08/03 0826) BP: (116-125)/(61-91) 120/61 (08/03 0902) SpO2:  [95 %-100 %] 100 % (08/03 0903) Weight:  [108.4 kg] 108.4 kg (08/03 0826)     Height: 6' 2 (188 cm) Weight: 108.4 kg BMI (Calculated): 30.67   INTAKE/OUTPUT:  This shift: No intake/output data recorded.  Last 2 shifts: @IOLAST2SHIFTS @   PHYSICAL EXAM:  Constitutional:  -- Normal body habitus  -- Awake, alert, and oriented x3  Eyes:  -- Pupils equally round and reactive to light  -- No scleral icterus  Ear, nose, and throat:  -- No jugular venous distension  Pulmonary:  -- No crackles  -- Equal breath sounds bilaterally -- Breathing non-labored at rest Cardiovascular:  -- S1, S2 present  -- No pericardial rubs Gastrointestinal:  -- Abdomen soft, localized  tenderness over the wound.  Copious cream-colored drainage from the top of the wound. -- No abdominal masses appreciated, pulsatile or otherwise  Musculoskeletal and Integumentary:  -- Wounds: None appreciated -- Extremities: B/L UE and LE FROM, hands and feet warm, no edema  Neurologic:  -- Motor function: intact and symmetric -- Sensation: intact and symmetric   Labs:     Latest Ref Rng & Units 10/17/2023    8:59 AM 10/10/2023    6:40 PM 09/13/2023   10:35 AM  CBC  WBC 4.0 - 10.5 K/uL 6.9  6.5    Hemoglobin 13.0 - 17.0 g/dL 88.1  87.7  87.8   Hematocrit 39.0 - 52.0 % 37.3  39.0  40.0   Platelets 150 - 400 K/uL 230  289        Latest Ref Rng & Units 10/17/2023    8:59 AM 10/10/2023    6:40 PM 09/28/2023    9:49 AM  CMP  Glucose 70 - 99 mg/dL 765  192  103   BUN 6 - 20 mg/dL 19  19  15    Creatinine 0.61 - 1.24 mg/dL 8.25  8.51  8.67   Sodium 135 - 145 mmol/L 134  138  136   Potassium 3.5 - 5.1 mmol/L 4.1  3.6  4.5   Chloride 98 - 111 mmol/L 100  99  96   CO2 22 - 32 mmol/L 24  26  20    Calcium  8.9 - 10.3 mg/dL 9.3  89.8  9.3   Total Protein 6.5 - 8.1 g/dL  8.3  7.5   Total Bilirubin 0.0 - 1.2 mg/dL  0.9  0.5   Alkaline Phos 38 - 126 U/L  51  64   AST 15 - 41 U/L  35  32   ALT 0 - 44 U/L  39  40     Imaging studies:  I reviewed CT scan from October 10, 2023 that shows the gas/fluid collection.  CT scan was done before the last surgical intervention.  Assessment/Plan:  58 y.o. male with acute over chronic wound infection from previous hernia repair with mesh, complicated by pertinent comorbidities including diabetes.  -This seems to be an acute over chronic infection of previous hernia repair with mesh - It was attempted to control his infection with removal of permanent stage but as per patient the drainage continued to be significant - He has presented to the urgent care and to the ED 2 days in a row even when offered to go to his surgeon office on Monday - I think the  patient will benefit of IV antibiotic therapy - Will discuss with primary surgeon if patient will benefit of surgical intervention during this admission for removal of the mesh as it was planned in the past since the removal of the stitch does not seems to improve his chronic infection condition. - I have low concern of deeper intra-abdominal pathology, no further imaging needed at this moment.  Lucas Petrin, MD

## 2023-10-17 NOTE — Progress Notes (Signed)
 Patient admitted for wound leaking. Pt A&OX4 BP 120/71   Pulse 89   Temp 97.9 F (36.6 C)   Resp 16   Ht 6' 2 (1.88 m)   Wt 108.4 kg   SpO2 96%   BMI 30.68 kg/m  Pt has surgical scar with small amount of pus like drainage. New bandage put on prior to coming to floor. Bed in lowest position, wheels locked, call bell near by. IV zosyn  started. PRN hydrocodone  given for pain.  Aureliano VEAR Louder 10/17/23 2:48 PM

## 2023-10-17 NOTE — Progress Notes (Signed)
 ED Pharmacy Antibiotic Sign Off An antibiotic consult was received from an ED provider for Vancomycin  per pharmacy dosing for wound infection. A chart review was completed to assess appropriateness.   The following one time order(s) were placed:  Vancomycin  2000mg    Further antibiotic and/or antibiotic pharmacy consults should be ordered by the admitting provider if indicated.   Thank you for allowing pharmacy to be a part of this patient's care.   Estill CHRISTELLA Lutes, PharmD, BCPS Clinical Pharmacist 10/17/2023 9:43 AM

## 2023-10-17 NOTE — ED Notes (Signed)
 Pt up to use restroom, steady on feet, no assistance required

## 2023-10-18 ENCOUNTER — Inpatient Hospital Stay

## 2023-10-18 DIAGNOSIS — L02211 Cutaneous abscess of abdominal wall: Secondary | ICD-10-CM | POA: Diagnosis not present

## 2023-10-18 DIAGNOSIS — L03311 Cellulitis of abdominal wall: Secondary | ICD-10-CM | POA: Diagnosis not present

## 2023-10-18 LAB — URINALYSIS, ROUTINE W REFLEX MICROSCOPIC
Bacteria, UA: NONE SEEN
Bilirubin Urine: NEGATIVE
Glucose, UA: >=500 mg/dL — AB
Hgb urine dipstick: NEGATIVE
Ketones, ur: NEGATIVE mg/dL
Leukocytes,Ua: NEGATIVE
Nitrite: NEGATIVE
Protein, ur: NEGATIVE mg/dL
Specific Gravity, Urine: 1.01 (ref 1.005–1.030)
pH: 6 (ref 5.0–8.0)

## 2023-10-18 LAB — VANCOMYCIN, RANDOM: Vancomycin Rm: 11 ug/mL

## 2023-10-18 LAB — GLUCOSE, CAPILLARY
Glucose-Capillary: 121 mg/dL — ABNORMAL HIGH (ref 70–99)
Glucose-Capillary: 128 mg/dL — ABNORMAL HIGH (ref 70–99)
Glucose-Capillary: 133 mg/dL — ABNORMAL HIGH (ref 70–99)
Glucose-Capillary: 154 mg/dL — ABNORMAL HIGH (ref 70–99)

## 2023-10-18 LAB — PROTEIN / CREATININE RATIO, URINE
Creatinine, Urine: 46 mg/dL
Total Protein, Urine: <6 mg/dL

## 2023-10-18 LAB — CREATININE, SERUM
Creatinine, Ser: 2.52 mg/dL — ABNORMAL HIGH (ref 0.61–1.24)
GFR, Estimated: 28 mL/min — ABNORMAL LOW (ref >60)

## 2023-10-18 MED ORDER — SODIUM CHLORIDE 0.9 % IV SOLN: INTRAVENOUS | Status: AC

## 2023-10-18 MED ORDER — VANCOMYCIN VARIABLE DOSE PER UNSTABLE RENAL FUNCTION (PHARMACIST DOSING): Status: DC

## 2023-10-18 MED ORDER — SODIUM CHLORIDE 0.9 % IV BOLUS
1000.0000 mL | Freq: Once | INTRAVENOUS | Status: AC
  Administered 2023-10-18: 1000 mL via INTRAVENOUS

## 2023-10-18 MED ORDER — VANCOMYCIN HCL 1250 MG/250ML IV SOLN
1250.0000 mg | INTRAVENOUS | Status: DC
  Administered 2023-10-18: 1250 mg via INTRAVENOUS
  Filled 2023-10-18 (×2): qty 250

## 2023-10-18 MED ORDER — HYDROCODONE-ACETAMINOPHEN 5-325 MG PO TABS
1.0000 | ORAL_TABLET | ORAL | Status: DC | PRN
  Administered 2023-10-18 (×2): 2 via ORAL
  Administered 2023-10-19: 1 via ORAL
  Administered 2023-10-19 – 2023-10-20 (×2): 2 via ORAL
  Filled 2023-10-18 (×4): qty 2
  Filled 2023-10-18: qty 1
  Filled 2023-10-18 (×2): qty 2

## 2023-10-18 MED ORDER — ACETAMINOPHEN 500 MG PO TABS
1000.0000 mg | ORAL_TABLET | Freq: Four times a day (QID) | ORAL | Status: DC
  Administered 2023-10-18: 1000 mg via ORAL
  Filled 2023-10-18: qty 2

## 2023-10-18 MED ORDER — CHLORHEXIDINE GLUCONATE CLOTH 2 % EX PADS
6.0000 | MEDICATED_PAD | Freq: Every day | CUTANEOUS | Status: DC
  Administered 2023-10-18 – 2023-10-19 (×2): 6 via TOPICAL

## 2023-10-18 MED ORDER — OXYCODONE HCL 5 MG PO TABS
5.0000 mg | ORAL_TABLET | ORAL | Status: DC | PRN
  Administered 2023-10-18 (×2): 5 mg via ORAL
  Filled 2023-10-18 (×2): qty 1

## 2023-10-18 MED ORDER — GABAPENTIN 300 MG PO CAPS
300.0000 mg | ORAL_CAPSULE | Freq: Three times a day (TID) | ORAL | Status: DC
  Administered 2023-10-18 – 2023-10-20 (×5): 300 mg via ORAL
  Filled 2023-10-18 (×5): qty 1

## 2023-10-18 NOTE — Progress Notes (Signed)
 Pharmacy Antibiotic Note  Darren Allen is a 58 y.o. male admitted on 10/17/2023 with wound infection (infected hernia repair mesh). Pharmacy has been consulted for Vancomycin  dosing. Patient is also receiving zosyn .   Plan: Patient received vancomycin  2g IV x 1 dose in ED.   Scr increase 1.74 >> 2.52 Random Vancomycin  level check 8/4@0825 = 11 mcg/ml Will adjust Vancomycin  from 1750 mg to 1250 mg IV Q 24 hrs. Goal AUC 400-550. Expected AUC: 464 SCr used: 2.52 Vd: 0.72   Pharmacy will monitor renal function and adjust dose as needed.    Height: 6' 2 (188 cm) Weight: 108.4 kg (238 lb 15.7 oz) IBW/kg (Calculated) : 82.2  Temp (24hrs), Avg:98.6 F (37 C), Min:98.3 F (36.8 C), Max:98.9 F (37.2 C)  Recent Labs  Lab 10/17/23 0859 10/18/23 0438 10/18/23 0825  WBC 6.9  --   --   CREATININE 1.74* 2.52*  --   VANCORANDOM  --   --  11    Estimated Creatinine Clearance: 40.9 mL/min (A) (by C-G formula based on SCr of 2.52 mg/dL (H)).    Allergies  Allergen Reactions   Dupixent [Dupilumab] Rash   Glipizide Palpitations and Other (See Comments)    Shaky, feel bad Other reaction(s): Dizziness   Ciprofloxacin  Itching    Caused itching and burning    Cephalexin  Rash    Received IV rocephin , zosyn  w/o issues   Duloxetine Anxiety and Nausea Only    Antimicrobials this admission: 8/3 Zosyn   >>  8/3 Vancomycin  >>   Dose adjustments this admission: 8/4 Vanc 1750 >>1250mg  q24h  Microbiology results: N/A  Thank you for allowing pharmacy to be a part of this patient's care.  Suzann Allean DELENA, PharmD Clinical Pharmacist 10/18/2023 9:02 AM

## 2023-10-18 NOTE — Progress Notes (Addendum)
 CC: chorionic wound drainage Subjective: Darren Allen is a 58 year old male well-known to me with multiple medical issues including chronic kidney disease with COPD diabetes.  He did have ventral hernia repair by me on 04/08/2023 w Ventralex.  Bellybutton infection that was appropriately drained.  Has had chronic drainage issues from the wound.  5 days ago given his chronic drainage we will perform a debridement.  Exploration of the wound was unimpressive.  There was only subcutaneous tissue that had chronic inflammation and the deeper tissues were well incorporated.  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  The stitches were removed. I intentionally did not completely close the wound.  As Suspected he had some drainage but more than anticipated. He did have a CT scan that I have personally reviewed showing chronic changes without new findings.  No definitive evidence of abscesses or necrotizing infection.    Objective: Vital signs in last 24 hours: Temp:  [98.3 F (36.8 C)-98.9 F (37.2 C)] 98.3 F (36.8 C) (08/04 0801) Pulse Rate:  [84-98] 85 (08/04 0801) Resp:  [16-17] 16 (08/04 0801) BP: (83-120)/(53-84) 92/66 (08/04 0801) SpO2:  [95 %-99 %] 97 % (08/04 0801) Last BM Date : 10/16/23  Intake/Output from previous day: 08/03 0701 - 08/04 0700 In: 340 [P.O.:240; IV Piggyback:100] Out: -  Intake/Output this shift: No intake/output data recorded.  Physical exam:  NAD alert Ab: soft, mild appropriate tenderness w/o peritonitis, wound healing well w expected exudate ( as it was not completely closed). No erythema and no active infection  Lab Results: CBC  Recent Labs    10/17/23 0859  WBC 6.9  HGB 11.8*  HCT 37.3*  PLT 230   BMET Recent Labs    10/17/23 0859 10/18/23 0438  NA 134*  --   K 4.1  --   CL 100  --   CO2 24  --   GLUCOSE 234*  --   BUN 19  --   CREATININE 1.74* 2.52*  CALCIUM  9.3  --    PT/INR No results  for input(s): LABPROT, INR in the last 72 hours. ABG No results for input(s): PHART, HCO3 in the last 72 hours.  Invalid input(s): PCO2, PO2  Studies/Results: No results found.  Anti-infectives: Anti-infectives (From admission, onward)    Start     Dose/Rate Route Frequency Ordered Stop   10/18/23 1000  vancomycin  (VANCOREADY) IVPB 1250 mg/250 mL        1,250 mg 166.7 mL/hr over 90 Minutes Intravenous Every 24 hours 10/18/23 0902     10/18/23 0800  vancomycin  (VANCOREADY) IVPB 1750 mg/350 mL  Status:  Discontinued        1,750 mg 175 mL/hr over 120 Minutes Intravenous Every 24 hours 10/17/23 1127 10/18/23 0729   10/18/23 0736  vancomycin  variable dose per unstable renal function (pharmacist dosing)  Status:  Discontinued         Does not apply See admin instructions 10/18/23 0736 10/18/23 0902   10/17/23 1400  piperacillin -tazobactam (ZOSYN ) IVPB 3.375 g        3.375 g 12.5 mL/hr over 240 Minutes Intravenous Every 8 hours 10/17/23 1008     10/17/23 1015  vancomycin  (VANCOCIN ) IVPB 1000 mg/200 mL premix  Status:  Discontinued        1,000 mg 200 mL/hr over 60 Minutes Intravenous Every 12 hours 10/17/23 1008 10/17/23 1024   10/17/23 0945  vancomycin  (VANCOCIN ) IVPB 1000 mg/200 mL premix  Status:  Discontinued  1,000 mg 200 mL/hr over 60 Minutes Intravenous  Once 10/17/23 9060 10/17/23 0942   10/17/23 0945  piperacillin -tazobactam (ZOSYN ) IVPB 3.375 g        3.375 g 100 mL/hr over 30 Minutes Intravenous  Once 10/17/23 0939 10/17/23 1040   10/17/23 0945  vancomycin  (VANCOREADY) IVPB 2000 mg/400 mL        2,000 mg 200 mL/hr over 120 Minutes Intravenous  Once 10/17/23 9057 10/17/23 1207       Assessment/Plan: 58 year old male with chronic wound issues, surgery I did a debridement only since mesh was incorporated already. I am not convinced he has a true mesh infection but rather chronic wound healing issues and lack of incorporation of mesh creating chronic wound  drainage.  Does have an acute kidney injury and I will consult nephrology.  Will hold antihypertensive therapy for now and start IV fluids. An extensive discussion with him regarding options.  I do anticipate that he will continue to have wound drainage issues for a while regardless of what we do. He is very concerned however about having a permanent foreign body.  More over he is afraid that he is body is rejecting the mesh as his mother had the same issue. Discussed with him about another alternative that is removing the mesh and placing phasix.  He has reached the point that he wishes to have the old mesh removed given all the issues that have caused him. I do not think that that is an unreasonable ask about also was very clear about expectations.  I think that he will continue to have chronic drainage until his tissues are finally healed.  That being said we will plan to explore him tomorrow and remove the mesh and replace it with phasix.  She discussed with the patient in detail.  The risk the benefits and the possible complications I personally spent a total of 50 minutes in the care of the patient today including performing a medically appropriate exam/evaluation, counseling and educating, placing orders, referring and communicating with other health care professionals, documenting clinical information in the EHR, independently interpreting and reviewing images studies and coordinating care.     Laneta Luna, MD, [facility]  10/18/2023

## 2023-10-18 NOTE — Care Management Important Message (Signed)
 Important Message  Patient Details  Name: Darren Allen MRN: [id number] Date of Birth: [birth year]   Important Message Given:  Yes - Medicare IM     Darren Allen 10/18/2023, 12:15 PM

## 2023-10-18 NOTE — Consult Note (Signed)
 [facility] Consult Note:10/18/23     Date of Admission:  10/17/2023           Reason for Consult: AKI    Referring Provider: Jordis Laneta FALCON, MD Primary Care Provider: Melvin Pao, NP   History of Presenting Illness:  Darren Allen is a 58 y.o. male With medical problems of coronary artery disease, diabetes with peripheral neuropathy, chronic kidney disease, chronic lower back pain, diverticulosis, emphysema, GERD, history of kidney stones, hypertension, obstructive sleep apnea, peripheral vascular disease.  He is status post ventral hernia repair on 04/08/2023.  He has had chronic drainage issues from the wound.  He had subcutaneous chronic granulomatous inflammation.  CT scans with IV contrast have been negative for abscess or necrotizing infection.  He is scheduled to go to the OR tomorrow for removal of the mesh and placing phasix.  Nephrology consult has been requested to evaluate acute kidney injury.  Review of labs indicate that his baseline creatinine is 1.32/GFR 62 from 09/28/2023.  Since then it has been progressively worsening and has peaked at 2.52 today with corresponding GFR of 28.  Patient has had multiple CT scans with IV contrast.  Most recent were on 07/28/2023, 09/02/2023 and 10/10/2023.  CT from 10/07/2025 2725 shows nonobstructing 4 mm calculus in the terminal left ureter at the UV junction.  Patient reports that few days ago he has noticed a pain that was radiating to his left groin.  It is resolved now.  He does not know whether he has passed a stone.  He does not gross hematuria.  He does not have any leg edema.  No shortness of breath.  Patient and his wife quit smoking follow-up last year.  He reports his diabetes control has been suboptimal this year due to ongoing inflammatory issues.   Review of Systems: Review of Systems  Constitutional:  Negative for chills and fever.  HENT:  Negative for hearing loss.   Eyes:  Negative for blurred  vision.  Respiratory:  Negative for cough.   Cardiovascular:  Negative for chest pain.  Gastrointestinal:  Negative for heartburn.  Genitourinary:  Negative for dysuria.  Musculoskeletal:  Negative for myalgias.  Skin:  Negative for rash.  Neurological:  Negative for dizziness.  Endo/Heme/Allergies:  Does not bruise/bleed easily.  Psychiatric/Behavioral:  Negative for depression.     Past Medical History:  Diagnosis Date   Allergy    Anxiety    Asthma    Chronic kidney disease    Chronic lower back pain    a.) followed by pain management; on COT   COPD (chronic obstructive pulmonary disease) (HCC)    Coronary artery disease    a.) cCTA 06/30/2021: Ca2+ = 216 (84th %ile; 25049% pRCA and pLAD))   DDD (degenerative disc disease), lumbosacral    Depression    Diabetic peripheral neuropathy (HCC)    Diastolic dysfunction 12/27/2020   a.) TTE 12/27/2020: EF >55%, no RWMAs, G1DD, norm RVSF, triv MR/TR?PR   Diverticulosis    Emphysema of lung (HCC)    Erectile dysfunction    a.) on PDE5i (tadalafil )   GERD (gastroesophageal reflux disease)    Hepatic steatosis    History of kidney stones    Hyperlipidemia    Hypertension    Hypogonadism male    a.) on exogenous TRT (depotestosterone cypionate)   IDA (iron deficiency anemia)    Long term current use of opiate analgesic    a.) followed by pain management; naloxone  Rx  available   OSA on CPAP    Osteoporosis    Paraesophageal hernia    a.) s/p robotic assisted repair 01/2022   PVD (peripheral vascular disease) (HCC)    Sleep apnea    T2DM (type 2 diabetes mellitus) (HCC)    Umbilical hernia    a.) s/p repair 01/20/2022   Ventral hernia     Social History   Tobacco Use   Smoking status: Former    Current packs/day: 0.50    Average packs/day: 0.5 packs/day for 34.0 years (17.0 ttl pk-yrs)    Types: Cigarettes    Passive exposure: Past   Smokeless tobacco: Current  Vaping Use   Vaping status: Some Days   Substances:  Nicotine   Substance Use Topics   Alcohol use: Not Currently    Alcohol/week: 12.0 standard drinks of alcohol    Types: 12 Cans of beer per week    Comment: Quit February 2023   Drug use: Yes    Types: Hydrocodone     Family History  Problem Relation Age of Onset   Lung cancer Mother 47   Cancer Mother    Colon cancer Father 63   Heart disease Father    Alcohol abuse Father    Cancer Father    Esophageal cancer Brother 72   Diabetes Brother    Cancer Brother    Kidney disease Brother    Heart disease Brother    Lung cancer Maternal Aunt    Cancer Paternal Aunt        unk type   Cancer Maternal Grandfather        unk type   Drug abuse Daughter      OBJECTIVE: Blood pressure 92/66, pulse 85, temperature 98.3 F (36.8 C), resp. rate 16, height 6' 2 (1.88 m), weight 108.4 kg, SpO2 97%.  Physical Exam General Appearance-well appearing, laying in the bed HEENT-moist oral mucous membranes Pulmonary-normal breathing effort on room air, clear to auscultation Cardiac-regular rhythm, no rub or gallop Abdomen-soft, nontender, obese Extremities-no peripheral edema Neuro-alert and oriented Skin-no acute rashes noted Musculoskeletal-no acute joint effusions noted   Lab Results Lab Results  Component Value Date   WBC 6.9 10/17/2023   HGB 11.8 (L) 10/17/2023   HCT 37.3 (L) 10/17/2023   MCV 76.7 (L) 10/17/2023   PLT 230 10/17/2023    Lab Results  Component Value Date   CREATININE 2.52 (H) 10/18/2023   BUN 19 10/17/2023   NA 134 (L) 10/17/2023   K 4.1 10/17/2023   CL 100 10/17/2023   CO2 24 10/17/2023    Lab Results  Component Value Date   ALT 39 10/10/2023   AST 35 10/10/2023   ALKPHOS 51 10/10/2023   BILITOT 0.9 10/10/2023     Microbiology: Recent Results (from the past 240 hours)  Aerobic Culture w Gram Stain (superficial specimen)     Status: None   Collection Time: 10/13/23 12:42 PM   Specimen: Wound  Result Value Ref Range Status   Specimen  Description   Final    WOUND Performed at [facility], [address], [place], [place] [address]    Special Requests   Final    NONE Performed at [facility], [address] [place], [place] [address]    Gram Stain   Final    RARE WBC PRESENT, PREDOMINANTLY MONONUCLEAR NO ORGANISMS SEEN    Culture   Final    NO GROWTH 4 DAYS Performed at [facility], [address], [place], [place] [address]    Report Status 10/17/2023 FINAL  Final    Medications: Scheduled Meds:  acetaminophen   1,000 mg Oral Q6H   amitriptyline   30 mg Oral QHS   budesonide -glycopyrrolate -formoterol   2 puff Inhalation BID   Chlorhexidine  Gluconate Cloth  6 each Topical Q0600   enoxaparin  (LOVENOX ) injection  40 mg Subcutaneous Q24H   gabapentin   300 mg Oral TID   insulin  aspart  0-15 Units Subcutaneous TID WC   insulin  aspart  0-5 Units Subcutaneous QHS   lubiprostone   8 mcg Oral BID WC   pantoprazole   40 mg Oral BID   rosuvastatin   40 mg Oral QHS   Continuous Infusions:  sodium chloride  75 mL/hr at 10/18/23 1026   piperacillin -tazobactam (ZOSYN )  IV 3.375 g (10/18/23 1350)   vancomycin  1,250 mg (10/18/23 1213)   PRN Meds:.ipratropium-albuterol , morphine  injection, ondansetron  **OR** ondansetron  (ZOFRAN ) IV, oxyCODONE   Allergies  Allergen Reactions   Dupixent [Dupilumab] Rash   Glipizide Palpitations and Other (See Comments)    Shaky, feel bad Other reaction(s): Dizziness   Ciprofloxacin  Itching    Caused itching and burning    Cephalexin  Rash    Received IV rocephin , zosyn  w/o issues   Duloxetine Anxiety and Nausea Only    Urinalysis: No results for input(s): COLORURINE, LABSPEC, PHURINE, GLUCOSEU, HGBUR, BILIRUBINUR, KETONESUR, PROTEINUR, UROBILINOGEN, NITRITE, LEUKOCYTESUR in the last 72 hours.  Invalid input(s): APPERANCEUR    Imaging: DG Chest 2 View Result Date: 10/18/2023 CLINICAL DATA:  COPD. EXAM: CHEST - 2 VIEW COMPARISON:   02/15/2022, 02/03/2022 FINDINGS: Lungs are adequately inflated demonstrate chronic mild linear density over the left base likely scarring. No acute airspace process or effusion. Cardiomediastinal silhouette and remainder of the exam is unchanged. IMPRESSION: No acute cardiopulmonary disease. Electronically Signed   By: Toribio Agreste M.D.   On: 10/18/2023 11:10      Assessment/Plan:  Darren Allen is a 58 y.o. male with medical problems of diabetes, hypertension, obstructive sleep apnea, COPD/emphysema, severe diverticulosis, kidney stones, hypogonadism, chronic back pain, peripheral vascular disease, status post ventral hernia repair in January 2025, status post umbilical hernia repair in 2023.  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  Acute kidney injury, chronic kidney disease stage II Differential includes multiple recent IV contrast exposures and ongoing inflammation from postsurgical issues.  His baseline creatinine is 1.3/GFR 62 from 09/28/2023.  Creatinine has increased to 2.5/GFR 28 today. - Will obtain urinalysis and urine protein to creatinine ratio. - Avoid further IV contrast exposure - Avoid nonsteroidals, aminoglycosides and hypotension. - Monitor vancomycin  levels.  Pharmacy assistance requested. - Hold dapagliflozin  for now in the perioperative setting.  May resume once renal function improves or as outpatient. - Gabapentin  dose may need to be reduced in the setting of acute kidney injury.  I will reduce the dose to 300 mg 3 times daily for now.  2.  Left kidney stone at the UV junction Seen on CT abdomen pelvis with contrast on 10/10/2023. This will need to be monitored and probably assessed with next abdominal imaging.    Mckinzy Fuller Dennise 10/18/23

## 2023-10-19 ENCOUNTER — Inpatient Hospital Stay

## 2023-10-19 ENCOUNTER — Other Ambulatory Visit: Payer: Self-pay

## 2023-10-19 ENCOUNTER — Encounter: Payer: Self-pay | Admitting: General Surgery

## 2023-10-19 ENCOUNTER — Encounter
Admission: EM | Disposition: A | Discharge status: Home / Self Care | Payer: Self-pay | Source: Ambulatory Visit | Attending: Surgery

## 2023-10-19 DIAGNOSIS — T85898A Other specified complication of other internal prosthetic devices, implants and grafts, initial encounter: Secondary | ICD-10-CM

## 2023-10-19 DIAGNOSIS — K660 Peritoneal adhesions (postprocedural) (postinfection): Secondary | ICD-10-CM

## 2023-10-19 DIAGNOSIS — K432 Incisional hernia without obstruction or gangrene: Secondary | ICD-10-CM | POA: Diagnosis not present

## 2023-10-19 HISTORY — PX: VENTRAL HERNIA REPAIR: SHX424

## 2023-10-19 HISTORY — PX: EXCISION OF MESH: SHX6268

## 2023-10-19 LAB — GLUCOSE, CAPILLARY
Glucose-Capillary: 97 mg/dL (ref 70–99)
Glucose-Capillary: 109 mg/dL — ABNORMAL HIGH (ref 70–99)
Glucose-Capillary: 130 mg/dL — ABNORMAL HIGH (ref 70–99)
Glucose-Capillary: 246 mg/dL — ABNORMAL HIGH (ref 70–99)

## 2023-10-19 LAB — RENAL FUNCTION PANEL
Albumin: 3.6 g/dL (ref 3.5–5.0)
Anion gap: 7 (ref 5–15)
BUN: 27 mg/dL — ABNORMAL HIGH (ref 6–20)
CO2: 23 mmol/L (ref 22–32)
Calcium: 8.7 mg/dL — ABNORMAL LOW (ref 8.9–10.3)
Chloride: 107 mmol/L (ref 98–111)
Creatinine, Ser: 1.93 mg/dL — ABNORMAL HIGH (ref 0.61–1.24)
GFR, Estimated: 39 mL/min — ABNORMAL LOW (ref >60)
Glucose, Bld: 138 mg/dL — ABNORMAL HIGH (ref 70–99)
Phosphorus: 3.1 mg/dL (ref 2.5–4.6)
Potassium: 4.5 mmol/L (ref 3.5–5.1)
Sodium: 137 mmol/L (ref 135–145)

## 2023-10-19 SURGERY — REMOVAL, MESH, ABDOMEN OR PELVIS
Anesthesia: General

## 2023-10-19 MED ORDER — LIDOCAINE HCL (CARDIAC) PF 100 MG/5ML IV SOSY
PREFILLED_SYRINGE | INTRAVENOUS | Status: DC | PRN
Start: 2023-10-19 — End: 2023-10-19
  Administered 2023-10-19: 100 mg via INTRAVENOUS

## 2023-10-19 MED ORDER — ACETAMINOPHEN 10 MG/ML IV SOLN
INTRAVENOUS | Status: DC | PRN
  Administered 2023-10-19: 1000 mg via INTRAVENOUS

## 2023-10-19 MED ORDER — FENTANYL CITRATE (PF) 100 MCG/2ML IJ SOLN
INTRAMUSCULAR | Status: DC | PRN
  Administered 2023-10-19: 50 ug via INTRAVENOUS

## 2023-10-19 MED ORDER — FENTANYL CITRATE (PF) 100 MCG/2ML IJ SOLN
25.0000 ug | INTRAMUSCULAR | Status: DC | PRN
  Administered 2023-10-19 (×3): 25 ug via INTRAVENOUS

## 2023-10-19 MED ORDER — 0.9 % SODIUM CHLORIDE (POUR BTL) OPTIME
TOPICAL | Status: DC | PRN
  Administered 2023-10-19: 500 mL

## 2023-10-19 MED ORDER — FENTANYL CITRATE (PF) 100 MCG/2ML IJ SOLN
INTRAMUSCULAR | Status: AC
Start: 2023-10-19 — End: 2023-10-19
  Filled 2023-10-19: qty 2

## 2023-10-19 MED ORDER — BUPIVACAINE-EPINEPHRINE (PF) 0.25% -1:200000 IJ SOLN
INTRAMUSCULAR | Status: DC | PRN
  Administered 2023-10-19: 30 mL via PERINEURAL

## 2023-10-19 MED ORDER — CEFAZOLIN SODIUM-DEXTROSE 2-3 GM-%(50ML) IV SOLR
INTRAVENOUS | Status: DC | PRN
  Administered 2023-10-19: 2 g via INTRAVENOUS

## 2023-10-19 MED ORDER — SUGAMMADEX SODIUM 200 MG/2ML IV SOLN
INTRAVENOUS | Status: DC | PRN
  Administered 2023-10-19: 100 mg via INTRAVENOUS

## 2023-10-19 MED ORDER — BUPIVACAINE-EPINEPHRINE (PF) 0.5% -1:200000 IJ SOLN
INTRAMUSCULAR | Status: AC
  Filled 2023-10-19: qty 30

## 2023-10-19 MED ORDER — PROPOFOL 1000 MG/100ML IV EMUL
INTRAVENOUS | Status: AC
Start: 2023-10-19 — End: 2023-10-19
  Filled 2023-10-19: qty 100

## 2023-10-19 MED ORDER — DROPERIDOL 2.5 MG/ML IJ SOLN: 0.6250 mg | Freq: Once | INTRAMUSCULAR | Status: DC | PRN

## 2023-10-19 MED ORDER — VANCOMYCIN HCL 1750 MG/350ML IV SOLN
1750.0000 mg | INTRAVENOUS | Status: DC
  Administered 2023-10-19: 1750 mg via INTRAVENOUS
  Filled 2023-10-19 (×2): qty 350

## 2023-10-19 MED ORDER — ACETAMINOPHEN 10 MG/ML IV SOLN
1000.0000 mg | Freq: Once | INTRAVENOUS | Status: DC | PRN
Start: 2023-10-19 — End: 2023-10-19

## 2023-10-19 MED ORDER — PROPOFOL 1000 MG/100ML IV EMUL
INTRAVENOUS | Status: AC
  Filled 2023-10-19: qty 100

## 2023-10-19 MED ORDER — ACETAMINOPHEN 10 MG/ML IV SOLN
INTRAVENOUS | Status: AC
  Filled 2023-10-19: qty 100

## 2023-10-19 MED ORDER — OXYCODONE HCL 5 MG PO TABS: 5.0000 mg | ORAL_TABLET | Freq: Once | ORAL | Status: DC | PRN

## 2023-10-19 MED ORDER — MIDAZOLAM HCL 2 MG/2ML IJ SOLN
INTRAMUSCULAR | Status: DC | PRN
  Administered 2023-10-19: 2 mg via INTRAVENOUS

## 2023-10-19 MED ORDER — ROCURONIUM BROMIDE 10 MG/ML (PF) SYRINGE
PREFILLED_SYRINGE | INTRAVENOUS | Status: AC
  Filled 2023-10-19: qty 10

## 2023-10-19 MED ORDER — DEXAMETHASONE SODIUM PHOSPHATE 10 MG/ML IJ SOLN
INTRAMUSCULAR | Status: DC | PRN
  Administered 2023-10-19: 10 mg via INTRAVENOUS

## 2023-10-19 MED ORDER — DEXAMETHASONE SODIUM PHOSPHATE 10 MG/ML IJ SOLN: INTRAMUSCULAR | Status: DC | PRN

## 2023-10-19 MED ORDER — FENTANYL CITRATE (PF) 100 MCG/2ML IJ SOLN
INTRAMUSCULAR | Status: AC
  Filled 2023-10-19: qty 2

## 2023-10-19 MED ORDER — BUPIVACAINE LIPOSOME 1.3 % IJ SUSP
INTRAMUSCULAR | Status: AC
  Filled 2023-10-19: qty 20

## 2023-10-19 MED ORDER — MIDAZOLAM HCL 2 MG/2ML IJ SOLN
INTRAMUSCULAR | Status: AC
  Filled 2023-10-19: qty 2

## 2023-10-19 MED ORDER — ONDANSETRON HCL 4 MG/2ML IJ SOLN
INTRAMUSCULAR | Status: AC
  Filled 2023-10-19: qty 2

## 2023-10-19 MED ORDER — ONDANSETRON HCL 4 MG/2ML IJ SOLN
INTRAMUSCULAR | Status: DC | PRN
  Administered 2023-10-19: 4 mg via INTRAVENOUS

## 2023-10-19 MED ORDER — LIDOCAINE HCL (PF) 2 % IJ SOLN
INTRAMUSCULAR | Status: AC
  Filled 2023-10-19: qty 5

## 2023-10-19 MED ORDER — OXYCODONE HCL 5 MG/5ML PO SOLN: 5.0000 mg | Freq: Once | ORAL | Status: DC | PRN

## 2023-10-19 MED ORDER — CEFAZOLIN SODIUM 1 G IJ SOLR
INTRAMUSCULAR | Status: AC
  Filled 2023-10-19: qty 20

## 2023-10-19 MED ORDER — ROCURONIUM BROMIDE 100 MG/10ML IV SOLN
INTRAVENOUS | Status: DC | PRN
  Administered 2023-10-19: 20 mg via INTRAVENOUS
  Administered 2023-10-19: 50 mg via INTRAVENOUS

## 2023-10-19 MED ORDER — DEXAMETHASONE SODIUM PHOSPHATE 10 MG/ML IJ SOLN
INTRAMUSCULAR | Status: AC
  Filled 2023-10-19: qty 1

## 2023-10-19 MED ORDER — PROPOFOL 10 MG/ML IV BOLUS
INTRAVENOUS | Status: DC | PRN
  Administered 2023-10-19: 100 ug/kg/min via INTRAVENOUS
  Administered 2023-10-19: 200 mg via INTRAVENOUS

## 2023-10-19 MED ORDER — BUPIVACAINE-EPINEPHRINE (PF) 0.25% -1:200000 IJ SOLN
INTRAMUSCULAR | Status: AC
  Filled 2023-10-19: qty 30

## 2023-10-19 MED ORDER — SODIUM CHLORIDE 0.9 % IV SOLN: INTRAVENOUS | Status: DC | PRN

## 2023-10-19 MED ORDER — BUPIVACAINE LIPOSOME 1.3 % IJ SUSP
INTRAMUSCULAR | Status: DC | PRN
Start: 2023-10-19 — End: 2023-10-19
  Administered 2023-10-19: 20 mL

## 2023-10-19 SURGICAL SUPPLY — 32 items
BLADE CLIPPER SURG (BLADE) IMPLANT
CHLORAPREP W/TINT 26 (MISCELLANEOUS) IMPLANT
CLIP APPLIE 11 MED OPEN (CLIP) IMPLANT
CLIP APPLIE 13 LRG OPEN (CLIP) IMPLANT
CNTNR URN SCR LID CUP LEK RST (MISCELLANEOUS) IMPLANT
DERMABOND ADVANCED .7 DNX12 (GAUZE/BANDAGES/DRESSINGS) IMPLANT
DRAPE LAPAROTOMY 100X77 ABD (DRAPES) ×1 IMPLANT
DRSG OPSITE POSTOP 4X10 (GAUZE/BANDAGES/DRESSINGS) IMPLANT
DRSG OPSITE POSTOP 4X8 (GAUZE/BANDAGES/DRESSINGS) IMPLANT
ELECT CAUTERY BLADE 6.4 (BLADE) ×1 IMPLANT
ELECTRODE REM PT RTRN 9FT ADLT (ELECTROSURGICAL) ×1 IMPLANT
GLOVE BIO SURGEON STRL SZ7 (GLOVE) ×1 IMPLANT
GOWN STRL REUS W/ TWL LRG LVL3 (GOWN DISPOSABLE) ×2 IMPLANT
KIT TURNOVER KIT A (KITS) ×1 IMPLANT
MANIFOLD NEPTUNE II (INSTRUMENTS) ×1 IMPLANT
NDL FRENCH SPRG EYE 1/2 CRC (NEEDLE) IMPLANT
NDL HYPO 22X1.5 SAFETY MO (MISCELLANEOUS) ×1 IMPLANT
NEEDLE HYPO 22X1.5 SAFETY MO (MISCELLANEOUS) ×1 IMPLANT
PACK BASIN MINOR ARMC (MISCELLANEOUS) ×1 IMPLANT
SPONGE KITTNER 5P (MISCELLANEOUS) IMPLANT
SPONGE T-LAP 18X18 ~~LOC~~+RFID (SPONGE) ×1 IMPLANT
STAPLER SKIN PROX 35W (STAPLE) IMPLANT
SUT ETHIBOND 0 MO6 C/R (SUTURE) ×1 IMPLANT
SUT PDS AB 0 CT1 27 (SUTURE) IMPLANT
SUT SILK 3-0 18XBRD TIE 12 (SUTURE) IMPLANT
SUT VIC AB 2-0 SH 27XBRD (SUTURE) ×2 IMPLANT
SUT VIC AB 3-0 SH 27X BRD (SUTURE) IMPLANT
SUTURE MNCRL 4-0 27XMF (SUTURE) IMPLANT
SYR 20ML LL LF (SYRINGE) ×2 IMPLANT
TRAP FLUID SMOKE EVACUATOR (MISCELLANEOUS) ×1 IMPLANT
WATER STERILE IRR 1000ML POUR (IV SOLUTION) ×1 IMPLANT
WATER STERILE IRR 500ML POUR (IV SOLUTION) ×1 IMPLANT

## 2023-10-19 NOTE — Progress Notes (Signed)
 Pharmacy Antibiotic Note  Darren Allen is a 58 y.o. male admitted on 10/17/2023 with wound infection (infected hernia repair mesh). Pharmacy has been consulted for Vancomycin  dosing. Patient is also receiving zosyn .   Plan: Scr decrease 1.74 >> 2.52 >> 1.93 Will adjust Vancomycin  from 1250 mg to 1750 mg IV Q 24 hrs. Goal AUC 400-550. Expected AUC: 513 SCr used: 1.93 Vd: 0.72   Pharmacy will monitor renal function and adjust dose as needed.    Height: 6' 2 (188 cm) Weight: 108.4 kg (238 lb 15.7 oz) IBW/kg (Calculated) : 82.2  Temp (24hrs), Avg:97.9 F (36.6 C), Min:97.7 F (36.5 C), Max:98.4 F (36.9 C)  Recent Labs  Lab 10/17/23 0859 10/18/23 0438 10/18/23 0825 10/19/23 0422  WBC 6.9  --   --   --   CREATININE 1.74* 2.52*  --  1.93*  VANCORANDOM  --   --  11  --     Estimated Creatinine Clearance: 53.4 mL/min (A) (by C-G formula based on SCr of 1.93 mg/dL (H)).    Allergies  Allergen Reactions   Dupixent [Dupilumab] Rash   Glipizide Palpitations and Other (See Comments)    Shaky, feel bad Other reaction(s): Dizziness   Ciprofloxacin  Itching    Caused itching and burning    Cephalexin  Rash    Received IV rocephin , zosyn  w/o issues   Duloxetine Anxiety and Nausea Only    Antimicrobials this admission: 8/3 Zosyn   >>  8/3 Vancomycin  >>   Dose adjustments this admission: 8/4 Vanc 1750 >>1250mg  q24h 8/5 Vanc 1250 >>1750 mg q24h  Microbiology results: N/A  Thank you for allowing pharmacy to be a part of this patient's care.  Suzann Allean DELENA, PharmD Clinical Pharmacist 10/19/2023 8:09 AM

## 2023-10-19 NOTE — Progress Notes (Signed)
 Preoperative Review   Patient is met in the preoperative holding area. The history is reviewed in the chart and with the patient. I personally reviewed the options and rationale as well as the risks of this procedure that have been previously discussed with the patient. All questions asked by the patient and/or family were answered to their satisfaction.  Patient agrees to proceed with this procedure at this time.  Sterling Big M.D. FACS

## 2023-10-19 NOTE — Anesthesia Procedure Notes (Addendum)
 Procedure Name: Intubation Date/Time: 10/19/2023 1:45 PM  Performed by: Blair Suzen BRAVO, RNPre-anesthesia Checklist: Patient identified, Emergency Drugs available, Suction available and Patient being monitored Patient Re-evaluated:Patient Re-evaluated prior to induction Oxygen  Delivery Method: Circle system utilized Preoxygenation: Pre-oxygenation with 100% oxygen  Induction Type: IV induction Ventilation: Mask ventilation with difficulty, Two handed mask ventilation required and Oral airway inserted - appropriate to patient size Laryngoscope Size: McGrath and 4 Grade View: Grade I Tube type: Oral Tube size: 7.0 mm Number of attempts: 1 Airway Equipment and Method: Stylet and Oral airway Placement Confirmation: ETT inserted through vocal cords under direct vision, positive ETCO2 and breath sounds checked- equal and bilateral Secured at: 22 cm Tube secured with: Tape Dental Injury: Teeth and Oropharynx as per pre-operative assessment

## 2023-10-19 NOTE — Anesthesia Preprocedure Evaluation (Signed)
 Anesthesia Evaluation  Patient identified by MRN, date of birth, ID band Patient awake    Reviewed: Allergy & Precautions, H&P , NPO status , Patient's Chart, lab work & pertinent test results, reviewed documented beta blocker date and time   Airway Mallampati: III  TM Distance: >3 FB Neck ROM: full    Dental  (+) Edentulous Upper, Edentulous Lower   Pulmonary neg shortness of breath, asthma , sleep apnea and Continuous Positive Airway Pressure Ventilation , COPD,  COPD inhaler, former smoker   Pulmonary exam normal        Cardiovascular Exercise Tolerance: Poor hypertension, On Medications + CAD and + Peripheral Vascular Disease  Normal cardiovascular exam Rhythm:regular Rate:Normal     Neuro/Psych  PSYCHIATRIC DISORDERS Anxiety Depression     Neuromuscular disease    GI/Hepatic Neg liver ROS, hiatal hernia,GERD  Medicated,,  Endo/Other  negative endocrine ROSdiabetes, Well Controlled, Type 2, Oral Hypoglycemic Agents    Renal/GU Renal disease  negative genitourinary   Musculoskeletal   Abdominal   Peds  Hematology  (+) Blood dyscrasia, anemia   Anesthesia Other Findings Past Medical History: No date: Allergy No date: Anxiety No date: Asthma No date: Chronic kidney disease No date: Chronic lower back pain     Comment:  a.) followed by pain management; on COT No date: COPD (chronic obstructive pulmonary disease) (HCC) No date: Coronary artery disease     Comment:  a.) cCTA 06/30/2021: Ca2+ = 216 (84th %ile; 25049% pRCA               and pLAD)) No date: DDD (degenerative disc disease), lumbosacral No date: Depression No date: Diabetic peripheral neuropathy (HCC) 12/27/2020: Diastolic dysfunction     Comment:  a.) TTE 12/27/2020: EF >55%, no RWMAs, G1DD, norm RVSF,               triv MR/TR?PR No date: Diverticulosis No date: Emphysema of lung (HCC) No date: Erectile dysfunction     Comment:  a.) on PDE5i  (tadalafil ) No date: GERD (gastroesophageal reflux disease) No date: Hepatic steatosis No date: History of kidney stones No date: Hyperlipidemia No date: Hypertension No date: Hypogonadism male     Comment:  a.) on exogenous TRT (depotestosterone cypionate) No date: IDA (iron deficiency anemia) No date: Long term current use of opiate analgesic     Comment:  a.) followed by pain management; naloxone  Rx available No date: OSA on CPAP No date: Osteoporosis No date: Paraesophageal hernia     Comment:  a.) s/p robotic assisted repair 01/2022 No date: PVD (peripheral vascular disease) (HCC) No date: Sleep apnea No date: T2DM (type 2 diabetes mellitus) (HCC) No date: Umbilical hernia     Comment:  a.) s/p repair 01/20/2022 No date: Ventral hernia Past Surgical History: No date: ABDOMINAL HYSTERECTOMY No date: APPENDECTOMY 2016: BRONCHOSCOPY No date: CIRCUMCISION 12/31/2021: COLONOSCOPY WITH PROPOFOL ; N/A     Comment:  Procedure: COLONOSCOPY WITH PROPOFOL ;  Surgeon: Therisa Bi, MD;  Location: [facility] ENDOSCOPY;  Service:               Gastroenterology;  Laterality: N/A; 12/31/2021: ESOPHAGOGASTRODUODENOSCOPY; N/A     Comment:  Procedure: ESOPHAGOGASTRODUODENOSCOPY (EGD);  Surgeon:               Therisa Bi, MD;  Location: [facility] ENDOSCOPY;  Service:               Gastroenterology;  Laterality: N/A; 07/2023: HERNIA REPAIR 02/04/2015: INCISION AND DRAINAGE PERIRECTAL ABSCESS; N/A     Comment:  Procedure: IRRIGATION AND DEBRIDEMENT PERIRECTAL               ABSCESS;  Surgeon: Lonni Brands, MD;  Location:               [facility] ORS;  Service: General;  Laterality: N/A; 01/20/2022: INSERTION OF MESH     Comment:  Procedure: INSERTION OF MESH;  Surgeon: Jordis Laneta FALCON,               MD;  Location: [facility] ORS;  Service: General;; 04/08/2023: INSERTION OF MESH; N/A     Comment:  Procedure: INSERTION OF MESH;  Surgeon: Jordis Laneta FALCON,               MD;  Location: [facility] ORS;   Service: General;  Laterality:               N/A; No date: KIDNEY STONE SURGERY; Right 08/16/2023: LEFT HEART CATH AND CORONARY ANGIOGRAPHY; Left     Comment:  Procedure: LEFT HEART CATH AND CORONARY ANGIOGRAPHY;                Surgeon: Florencio Cara BIRCH, MD;  Location: [facility] INVASIVE              CV LAB;  Service: Cardiovascular;  Laterality: Left; No date: lung mass removal; N/A No date: MUSCLE BIOPSY 03/23/2017: ORIF ANKLE FRACTURE; Left     Comment:  Procedure: OPEN REDUCTION INTERNAL FIXATION (ORIF) ANKLE              FRACTURE;  Surgeon: Tobie Priest, MD;  Location: [facility]               ORS;  Service: Orthopedics;  Laterality: Left; 02/04/2015: RECTAL EXAM UNDER ANESTHESIA     Comment:  Procedure: RECTAL EXAM UNDER ANESTHESIA;  Surgeon:               Lonni Brands, MD;  Location: [facility] ORS;  Service:              General;; 03/23/2017: SYNDESMOSIS REPAIR; Left     Comment:  Procedure: SYNDESMOSIS REPAIR;  Surgeon: Tobie Priest,               MD;  Location: [facility] ORS;  Service: Orthopedics;                Laterality: Left; 01/20/2022: UMBILICAL HERNIA REPAIR     Comment:  Procedure: HERNIA REPAIR UMBILICAL ADULT;  Surgeon:               Jordis Laneta FALCON, MD;  Location: [facility] ORS;  Service:               General;; No date: VASECTOMY 04/08/2023: VENTRAL HERNIA REPAIR; N/A     Comment:  Procedure: HERNIA REPAIR VENTRAL ADULT, open;  Surgeon:               Jordis Laneta FALCON, MD;  Location: [facility] ORS;  Service:               General;  Laterality: N/A; 10/13/2023: VENTRAL HERNIA REPAIR; N/A     Comment:  Procedure: EXCISIONAL DEBRIDEMENT OF SKIN AND FASCIA;                Surgeon: Jordis Laneta FALCON, MD;  Location: [facility] ORS;                Service:  General;  Laterality: N/A;  open procedure               w/mesh 01/20/2022: XI ROBOTIC ASSISTED PARAESOPHAGEAL HERNIA REPAIR; N/A     Comment:  Procedure: XI ROBOTIC ASSISTED PARAESOPHAGEAL HERNIA               REPAIR, CONVERTED TO OPEN, RNFA to  assist;  Surgeon:               Jordis Laneta FALCON, MD;  Location: [facility] ORS;  Service:               General;  Laterality: N/A; BMI    Body Mass Index: 30.69 kg/m     Reproductive/Obstetrics negative OB ROS                              Anesthesia Physical Anesthesia Plan  ASA: 3 and emergent  Anesthesia Plan: General ETT   Post-op Pain Management:    Induction:   PONV Risk Score and Plan: 3  Airway Management Planned:   Additional Equipment:   Intra-op Plan:   Post-operative Plan:   Informed Consent: I have reviewed the patients History and Physical, chart, labs and discussed the procedure including the risks, benefits and alternatives for the proposed anesthesia with the patient or authorized representative who has indicated his/her understanding and acceptance.     Dental Advisory Given  Plan Discussed with: CRNA  Anesthesia Plan Comments:         Anesthesia Quick Evaluation

## 2023-10-19 NOTE — Transfer of Care (Signed)
 Immediate Anesthesia Transfer of Care Note  Patient: Darren Allen  Procedure(s) Performed: REMOVAL, MESH, ABDOMEN OR PELVIS REPAIR, HERNIA, VENTRAL  Patient Location: PACU  Anesthesia Type:General  Level of Consciousness: awake and alert   Airway & Oxygen  Therapy: Patient Spontanous Breathing and Patient connected to face mask oxygen   Post-op Assessment: Report given to RN and Post -op Vital signs reviewed and stable  Post vital signs: stable  Last Vitals:  Vitals Value Taken Time  BP    Temp    Pulse 81 10/19/23 15:43  Resp 13 10/19/23 15:43  SpO2 99 % 10/19/23 15:43  Vitals shown include unfiled device data.  Last Pain:  Vitals:   10/19/23 1211  TempSrc: Temporal  PainSc: 5       Patients Stated Pain Goal: 0 (10/17/23 2217)  Complications: There were no known notable events for this encounter.

## 2023-10-19 NOTE — Op Note (Signed)
 Excision of abdominal wall mesh Ventral Hernia Repair 4.5 cms recurent  Pre-operative Diagnosis: Chronic inflammatory mesh reaction  Post-operative Diagnosis: same  Surgeon: Laneta Luna, MD FACS  Anesthesia: Gen. with endotracheal tube  Findings: Incorporated mesh, no purulence, no evidence of fistula. Posterior wall of the mesh in close proximity to bowel and omentum After mesh was removed abdominal defect measured 4.5 cm Estimated Blood Loss: 10cc                Specimens: mesh       Complications: none              Procedure Details  The patient was seen again in the Holding Room. The benefits, complications, treatment options, and expected outcomes were discussed with the patient. The risks of bleeding, infection, recurrence of symptoms, failure to resolve symptoms, bowel injury, mesh placement, mesh infection, any of which could require further surgery were reviewed with the patient. The likelihood of improving the patient's symptoms with return to their baseline status is good.  The patient and/or family concurred with the proposed plan, giving informed consent.  The patient was taken to Operating Room, identified as Westin A Pincock and the procedure verified.  A Time Out was held and the above information confirmed.  Prior to the induction of general anesthesia, antibiotic prophylaxis was administered. VTE prophylaxis was in place. General endotracheal anesthesia was then administered and tolerated well. After the induction, the abdomen was prepped with Chloraprep and draped in the sterile fashion. The patient was positioned in the supine position.   Incision was created over prior incision, there was some serous drainage expected as I did not closed the wound hermetically.  I encountered the mesh well incorporated and without evidence of fistula or abscess. SOme chronic inflammatory changes. USing a combination of electrocautery I circumferentially dissected the mesh anteriorly  from the abdominal wall.  I identified an edge and I was able to dissect the mesh on the posterior surface using metz. There was bowel and omentum adhered to the mesh. Meticulous sharp dissection used. NO evidence of mesh erosion into bowel or abscess was seen. THere was a small serosal tear that was reinforced with 2-0 vicryl. Given the chronic nature and potential inflammatory reaction to mesh and foreign body. I decided not to place any additional mesh. I was able to develop cutaneous flaps circumferentially to decrease the tension. The defect measured 4.5 cms and it was closed with multiple 0 PDS sutures. Liposomal marcaine  placed bilateral to perform tap blocks. Dermis approximated with multiple 2-0 in a interrupted dermal fashion. Sterile dressing applied. Patient tolerated procedure well and there were no immediate complications. Needle and laparotomy counts were correct   Laneta Luna, MD, FACS

## 2023-10-19 NOTE — Plan of Care (Signed)

## 2023-10-19 NOTE — Progress Notes (Signed)
 [facility]  ROUNDING NOTE   Subjective:   Patient seen sitting up in bed Family at bedside Currently n.p.o. for surgical procedure  IV fluids and preop antibiotic infusing.  Objective:  Vital signs in last 24 hours:  Temp:  [97.7 F (36.5 C)-98.4 F (36.9 C)] 98.2 F (36.8 C) (08/05 1211) Pulse Rate:  [70-83] 77 (08/05 1211) Resp:  [15-16] 16 (08/05 1211) BP: (98-125)/(55-77) 121/77 (08/05 1211) SpO2:  [96 %-100 %] 96 % (08/05 0930) Weight:  [108.4 kg] 108.4 kg (08/05 1211)  Weight change:  Filed Weights   10/17/23 0826 10/19/23 1211  Weight: 108.4 kg 108.4 kg    Intake/Output: I/O last 3 completed shifts: In: 2049.5 [P.O.:480; I.V.:1050; IV Piggyback:519.5] Out: -    Intake/Output this shift:  No intake/output data recorded.  Physical Exam: General: NAD  Head: Normocephalic, atraumatic. Moist oral mucosal membranes  Eyes: Anicteric  Neck: Supple  Lungs:  Clear to auscultation  Heart: Regular rate and rhythm  Abdomen:  Soft, nontender  Extremities: No peripheral edema.  Neurologic: Awake, alert, conversant  Skin: Warm,dry, no rash       Basic Metabolic Panel: Recent Labs  Lab 10/17/23 0859 10/18/23 0438 10/19/23 0422  NA 134*  --  137  K 4.1  --  4.5  CL 100  --  107  CO2 24  --  23  GLUCOSE 234*  --  138*  BUN 19  --  27*  CREATININE 1.74* 2.52* 1.93*  CALCIUM  9.3  --  8.7*  PHOS  --   --  3.1    Liver Function Tests: Recent Labs  Lab 10/19/23 0422  ALBUMIN  3.6   No results for input(s): LIPASE, AMYLASE in the last 168 hours. No results for input(s): AMMONIA in the last 168 hours.  CBC: Recent Labs  Lab 10/17/23 0859  WBC 6.9  HGB 11.8*  HCT 37.3*  MCV 76.7*  PLT 230    Cardiac Enzymes: No results for input(s): CKTOTAL, CKMB, CKMBINDEX, TROPONINI in the last 168 hours.  BNP: Invalid input(s): POCBNP  CBG: Recent Labs  Lab 10/18/23 1153 10/18/23 1642 10/18/23 2126 10/19/23 0747  10/19/23 1140  GLUCAP 128* 133* 154* 97 109*    Microbiology: Results for orders placed or performed during the hospital encounter of 10/13/23  Aerobic Culture w Gram Stain (superficial specimen)     Status: None   Collection Time: 10/13/23 12:42 PM   Specimen: Wound  Result Value Ref Range Status   Specimen Description   Final    WOUND Performed at [facility], [address], [place], [place] [address]    Special Requests   Final    NONE Performed at [facility], [address] [place], [place] [address]    Gram Stain   Final    RARE WBC PRESENT, PREDOMINANTLY MONONUCLEAR NO ORGANISMS SEEN    Culture   Final    NO GROWTH 4 DAYS Performed at [facility], [address], [place], [place] [address]    Report Status 10/17/2023 FINAL  Final    Coagulation Studies: No results for input(s): LABPROT, INR in the last 72 hours.  Urinalysis: Recent Labs    10/18/23 1612  COLORURINE STRAW*  LABSPEC 1.010  PHURINE 6.0  GLUCOSEU >=500*  HGBUR NEGATIVE  BILIRUBINUR NEGATIVE  KETONESUR NEGATIVE  PROTEINUR NEGATIVE  NITRITE NEGATIVE  LEUKOCYTESUR NEGATIVE      Imaging: DG Chest 2 View Result Date: 10/18/2023 CLINICAL DATA:  COPD.  EXAM: CHEST - 2 VIEW COMPARISON:  02/15/2022, 02/03/2022 FINDINGS: Lungs are adequately inflated demonstrate chronic mild linear density over the left base likely scarring. No acute airspace process or effusion. Cardiomediastinal silhouette and remainder of the exam is unchanged. IMPRESSION: No acute cardiopulmonary disease. Electronically Signed   By: Toribio Agreste M.D.   On: 10/18/2023 11:10     Medications:    [MAR Hold] piperacillin -tazobactam (ZOSYN )  IV 3.375 g (10/19/23 0543)   [MAR Hold] vancomycin  1,750 mg (10/19/23 0942)    [MAR Hold] amitriptyline   30 mg Oral QHS   [MAR Hold] budesonide -glycopyrrolate -formoterol   2 puff Inhalation BID   [MAR Hold] Chlorhexidine  Gluconate Cloth  6 each Topical Q0600    [MAR Hold] enoxaparin  (LOVENOX ) injection  40 mg Subcutaneous Q24H   [MAR Hold] gabapentin   300 mg Oral TID   [MAR Hold] insulin  aspart  0-15 Units Subcutaneous TID WC   [MAR Hold] insulin  aspart  0-5 Units Subcutaneous QHS   [MAR Hold] lubiprostone   8 mcg Oral BID WC   [MAR Hold] pantoprazole   40 mg Oral BID   [MAR Hold] rosuvastatin   40 mg Oral QHS   [MAR Hold] HYDROcodone -acetaminophen , [MAR Hold] ipratropium-albuterol , [MAR Hold]  morphine  injection, [MAR Hold] ondansetron  **OR** [MAR Hold] ondansetron  (ZOFRAN ) IV  Assessment/ Plan:  [name] is a 58 y.o.  male with medical problems of diabetes, hypertension, obstructive sleep apnea, COPD/emphysema, severe diverticulosis, kidney stones, hypogonadism, chronic back pain, peripheral vascular disease, status post ventral hernia repair in January 2025, status post umbilical hernia repair in 2023.  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  His baseline creatinine is 1.3/GFR 62 from 09/28/2023.   - UA and protein creatinine ratio within normal limits -Creatinine did show improvement today, 1.93 -Continue to avoid further IV contrast exposure - Avoid nonsteroidals, aminoglycosides and hypotension. - Monitor vancomycin  levels.  Pharmacy assistance requested. - Hold dapagliflozin  for now in the perioperative setting.  May resume once renal function improves or as outpatient. - Gabapentin  reduced to 300 mg 3 times daily.  Lab Results  Component Value Date   CREATININE 1.93 (H) 10/19/2023   CREATININE 2.52 (H) 10/18/2023   CREATININE 1.74 (H) 10/17/2023    Intake/Output Summary (Last 24 hours) at 10/19/2023 1229 Last data filed at 10/19/2023 0350 Gross  per 24 hour  Intake 1469.45 ml  Output --  Net 1469.45 ml   2.  Left kidney stone at the UV junction Seen on CT abdomen pelvis with contrast on 10/10/2023. This will need to be monitored and probably assessed with next abdominal imaging.  Outpatient follow-up needed    LOS: 2 Clorine Swing 8/5/202512:29 PM

## 2023-10-20 ENCOUNTER — Encounter: Payer: Self-pay | Admitting: Surgery

## 2023-10-20 DIAGNOSIS — T85898A Other specified complication of other internal prosthetic devices, implants and grafts, initial encounter: Secondary | ICD-10-CM | POA: Diagnosis not present

## 2023-10-20 DIAGNOSIS — L03311 Cellulitis of abdominal wall: Secondary | ICD-10-CM | POA: Diagnosis not present

## 2023-10-20 DIAGNOSIS — K432 Incisional hernia without obstruction or gangrene: Secondary | ICD-10-CM | POA: Diagnosis not present

## 2023-10-20 LAB — CREATININE, SERUM
Creatinine, Ser: 1.53 mg/dL — ABNORMAL HIGH (ref 0.61–1.24)
GFR, Estimated: 52 mL/min — ABNORMAL LOW (ref >60)

## 2023-10-20 LAB — GLUCOSE, CAPILLARY: Glucose-Capillary: 197 mg/dL — ABNORMAL HIGH (ref 70–99)

## 2023-10-20 MED ORDER — VANCOMYCIN HCL 2000 MG/400ML IV SOLN
2000.0000 mg | INTRAVENOUS | Status: DC
  Administered 2023-10-20: 2000 mg via INTRAVENOUS
  Filled 2023-10-20: qty 400

## 2023-10-20 MED ORDER — HYDROCODONE-ACETAMINOPHEN 5-325 MG PO TABS: 1.0000 | ORAL_TABLET | Freq: Four times a day (QID) | ORAL | 0 refills | Status: DC | PRN

## 2023-10-20 NOTE — Progress Notes (Signed)
 [facility]  ROUNDING NOTE   Subjective:   Patient seen laying in bed No family present Alert and oriented Complains of abd soreness   Creatinine 1.53  Objective:  Vital signs in last 24 hours:  Temp:  [97.6 F (36.4 C)-98.5 F (36.9 C)] 98.1 F (36.7 C) (08/06 0857) Pulse Rate:  [67-92] 81 (08/06 0857) Resp:  [11-20] 20 (08/06 0857) BP: (100-144)/(64-91) 130/76 (08/06 0857) SpO2:  [91 %-100 %] 100 % (08/06 0857)  Weight change:  Filed Weights   10/17/23 0826 10/19/23 1211  Weight: 108.4 kg 108.4 kg    Intake/Output: I/O last 3 completed shifts: In: 2208 [P.O.:358; I.V.:1750; IV Piggyback:100] Out: 1205 [Urine:1200; Blood:5]   Intake/Output this shift:  Total I/O In: 240 [P.O.:240] Out: -   Physical Exam: General: NAD  Head: Normocephalic, atraumatic. Moist oral mucosal membranes  Eyes: Anicteric  Neck: Supple  Lungs:  Clear to auscultation  Heart: Regular rate and rhythm  Abdomen:  Soft, nontender  Extremities: No peripheral edema.  Neurologic: Awake, alert, conversant  Skin: Warm,dry, no rash       Basic Metabolic Panel: Recent Labs  Lab 10/17/23 0859 10/18/23 0438 10/19/23 0422 10/20/23 0350  NA 134*  --  137  --   K 4.1  --  4.5  --   CL 100  --  107  --   CO2 24  --  23  --   GLUCOSE 234*  --  138*  --   BUN 19  --  27*  --   CREATININE 1.74* 2.52* 1.93* 1.53*  CALCIUM  9.3  --  8.7*  --   PHOS  --   --  3.1  --     Liver Function Tests: Recent Labs  Lab 10/19/23 0422  ALBUMIN  3.6   No results for input(s): LIPASE, AMYLASE in the last 168 hours. No results for input(s): AMMONIA in the last 168 hours.  CBC: Recent Labs  Lab 10/17/23 0859  WBC 6.9  HGB 11.8*  HCT 37.3*  MCV 76.7*  PLT 230    Cardiac Enzymes: No results for input(s): CKTOTAL, CKMB, CKMBINDEX, TROPONINI in the last 168 hours.  BNP: Invalid input(s): POCBNP  CBG: Recent Labs  Lab 10/19/23 0747 10/19/23 1140 10/19/23 1541  10/19/23 2152 10/20/23 0828  GLUCAP 97 109* 130* 246* 197*    Microbiology: Results for orders placed or performed during the hospital encounter of 10/17/23  Aerobic/Anaerobic Culture w Gram Stain (surgical/deep wound)     Status: None (Preliminary result)   Collection Time: 10/19/23  2:08 PM   Specimen: Path fluid; Body Fluid  Result Value Ref Range Status   Specimen Description   Final    FLUID Performed at [facility], [address] [place], [place] [address]    Special Requests   Final    ABDOMINAL WALL FLUID Performed at [facility], [address] [place], [place] [address]    Gram Stain   Final    FEW WBC PRESENT, PREDOMINANTLY PMN RARE GRAM POSITIVE RODS    Culture   Final    TOO YOUNG TO READ Performed at [facility], [address] [place], [place] [address]    Report Status PENDING  Incomplete  Aerobic/Anaerobic Culture w Gram Stain (surgical/deep wound)     Status: None (Preliminary result)   Collection Time: 10/19/23  2:39 PM   Specimen: Path Tissue  Result Value Ref Range Status   Specimen Description   Final  TISSUE Performed at [facility], [address] [place], [place] [address]    Special Requests   Final    ABDOMINAL MESH Performed at [facility], [address] [place], [place] [address]    Gram Stain NO WBC SEEN NO ORGANISMS SEEN   Final   Culture   Final    NO GROWTH < 12 HOURS Performed at [facility], [address] [place], [place] [address]    Report Status PENDING  Incomplete    Coagulation Studies: No results for input(s): LABPROT, INR in the last 72 hours.  Urinalysis: Recent Labs    10/18/23 1612  COLORURINE STRAW*  LABSPEC 1.010  PHURINE 6.0  GLUCOSEU >=500*  HGBUR NEGATIVE  BILIRUBINUR NEGATIVE  KETONESUR NEGATIVE  PROTEINUR NEGATIVE  NITRITE NEGATIVE  LEUKOCYTESUR NEGATIVE      Imaging: No results found.    Medications:     piperacillin -tazobactam (ZOSYN )  IV 3.375 g (10/20/23 9388)   vancomycin  2,000 mg (10/20/23 1206)    amitriptyline   30 mg Oral QHS   budesonide -glycopyrrolate -formoterol   2 puff Inhalation BID   Chlorhexidine  Gluconate Cloth  6 each Topical Q0600   enoxaparin  (LOVENOX ) injection  40 mg Subcutaneous Q24H   gabapentin   300 mg Oral TID   insulin  aspart  0-15 Units Subcutaneous TID WC   insulin  aspart  0-5 Units Subcutaneous QHS   lubiprostone   8 mcg Oral BID WC   pantoprazole   40 mg Oral BID   rosuvastatin   40 mg Oral QHS   HYDROcodone -acetaminophen , ipratropium-albuterol , morphine  injection, ondansetron  **OR** ondansetron  (ZOFRAN ) IV  Assessment/ Plan:  [name] is a 58 y.o.  male with medical problems of diabetes, hypertension, obstructive sleep apnea, COPD/emphysema, severe diverticulosis, kidney stones, hypogonadism, chronic back pain, peripheral vascular disease, status post ventral hernia repair in January 2025, status post umbilical hernia repair in 2023.  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  His baseline creatinine is 1.3/GFR 62 from 09/28/2023.   - UA and protein creatinine ratio within normal limits -Creatinine has improved during this admission -Continue to avoid further IV contrast exposure - Avoid nonsteroidals, aminoglycosides and hypotension. - Monitor vancomycin  levels.  Pharmacy assistance requested. - Hold dapagliflozin  for now in the perioperative setting.  May resume once renal function improves or as outpatient. - Gabapentin  reduced to 300 mg 3 times daily. - Will schedule follow up in our office at discharge.   Lab Results  Component Value Date   CREATININE 1.53 (H)  10/20/2023   CREATININE 1.93 (H) 10/19/2023   CREATININE 2.52 (H) 10/18/2023    Intake/Output Summary (Last 24 hours) at 10/20/2023 1324 Last data filed at 10/20/2023 0900 Gross per 24 hour  Intake 1598 ml  Output 1205 ml  Net 393 ml   2.  Left kidney stone at the UV junction Seen on CT abdomen pelvis with contrast on 10/10/2023. This will need to be monitored and probably assessed with next abdominal imaging.  Outpatient follow-up needed    LOS: 3 Tamecia Mcdougald 8/6/20251:24 PM

## 2023-10-20 NOTE — Plan of Care (Signed)

## 2023-10-20 NOTE — Discharge Summary (Signed)
 Patient ID: Darren Allen MRN: [id number] DOB/AGE: 1965-05-31 58 y.o.  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  He underwent initial debridement of the wound and removal sutures on October 13, 2023.  At that time I felt that the mesh was incorporated at there was no signs of mesh infection.  He subsequently came back to the emergency room with worsening drainage and was admitted to the hospital for IV antibiotics.  I had another discussion with the patient regarding his disease process although clinical picture and radiographic pictures are not consistent with a mesh infection this might be chronic granulomatous foreign body reaction.  Discussed with him about options of removal of mesh and primary repair versus placement of STRATAFIX mesh.  He is okay with intraoperative decision.  He was taken back to the operating room and we removed the mesh.  Once again the mesh had no clear-cut evidence of infection no evidence of pus however it was closely adhered to the omentum and small bowel.  We were able to dissect the mesh free without issues.  We repaired the defect with multiple sutures to avoid any other potential foreign body reaction.  He was kept overnight after procedure.   At The time of discharge the patient was ambulating,  pain was controlled.  His vital signs were stable and she was afebrile.   physical exam at discharge showed a pt  in no acute distress.  Awake and alert.  Abdomen: Soft incisions healing well without infection or peritonitis.  Extremities well-perfused  and no edema.  Condition of the patient the time of discharge was stable   Consults: Nephrology  Disposition: Discharge disposition: 01-Home or Self Care       Discharge Instructions     Call MD for:  difficulty breathing, headache or visual disturbances   Complete by: As directed    Call MD for:  extreme fatigue   Complete by: As directed    Call MD for:  hives   Complete by: As directed    Call MD for:  persistant dizziness or light-headedness   Complete by: As directed    Call MD for:  persistant nausea and vomiting   Complete by: As directed    Call MD for:  redness, tenderness, or signs of infection (pain, swelling, redness, odor or green/yellow discharge around incision site)   Complete by: As directed    Call MD for:  severe uncontrolled pain   Complete by: As directed    Call MD for:  temperature >100.4   Complete by: As directed    Diet - low sodium heart healthy   Complete by: As directed    Increase activity slowly   Complete by: As directed    Remove dressing in 48 hours   Complete by: As directed       Allergies as of 10/20/2023       Reactions   Dupixent [dupilumab] Rash   Glipizide Palpitations, Other (See Comments)   Shaky, feel bad Other reaction(s): Dizziness   Ciprofloxacin  Itching   Caused itching and burning    Cephalexin  Rash  Received IV rocephin , zosyn  w/o issues   Duloxetine Anxiety, Nausea Only        Medication List     STOP taking these medications    oxyCODONE  5 MG immediate release tablet Commonly known as: Oxy IR/ROXICODONE    sulfamethoxazole -trimethoprim  800-160 MG tablet Commonly known as: BACTRIM  DS       TAKE these medications    amitriptyline  10 MG tablet Commonly known as: ELAVIL  Take 30 mg by mouth at bedtime.   ascorbic acid 1000 MG tablet Commonly known as: VITAMIN C Take 1,000 mg by mouth daily.   aspirin  EC 81 MG tablet Take 81 mg by mouth daily. Swallow whole.   BD SafetyGlide Needle 18G X  1-1/2 Misc Generic drug: NEEDLE (DISP) 18 G 1 mg by Does not apply route every 7 (seven) days.   BD SafetyGlide Needle 21G X 1 Misc Generic drug: NEEDLE (DISP) 21 G 1 mg by Does not apply route every 7 (seven) days.   Breztri  Aerosphere 160-9-4.8 MCG/ACT Aero inhaler Generic drug: budesonide -glycopyrrolate -formoterol  Inhale 2 puffs into the lungs in the morning and at bedtime.   Clindamycin-Benzoyl Per (Refr) gel Apply 1 Application topically every morning.   cyanocobalamin  1000 MCG tablet Commonly known as: VITAMIN B12 Take 1,000 mcg by mouth daily.   dapagliflozin  propanediol 10 MG Tabs tablet Commonly known as: Farxiga  Take 1 tablet (10 mg total) by mouth daily before breakfast.   ferrous sulfate  325 (65 FE) MG tablet Take 325 mg by mouth daily with breakfast.   fluticasone  50 MCG/ACT nasal spray Commonly known as: FLONASE  Place 1 spray into both nostrils daily as needed for allergies.   gabapentin  600 MG tablet Commonly known as: NEURONTIN  Take 600 mg by mouth 3 (three) times daily.   HumuLIN 70/30 KwikPen (70-30) 100 UNIT/ML KwikPen Generic drug: insulin  isophane & regular human KwikPen Inject 42-82 Units into the skin 2 (two) times daily with a meal. 82 in the a.m. and 42u qhs   hydrochlorothiazide  25 MG tablet Commonly known as: HYDRODIURIL  Take 1 tablet (25 mg total) by mouth daily.   HYDROcodone -acetaminophen  5-325 MG tablet Commonly known as: NORCO/VICODIN Take 1 tablet by mouth 2 (two) times daily as needed. Must last 30 days. What changed: when to take this   HYDROcodone -acetaminophen  5-325 MG tablet Commonly known as: NORCO/VICODIN Take 1-2 tablets by mouth every 6 (six) hours as needed for moderate pain (pain score 4-6). What changed: You were already taking a medication with the same name, and this prescription was added. Make sure you understand how and when to take each.   HYDROcodone -acetaminophen  5-325 MG tablet Commonly known as:  NORCO/VICODIN Take 1 tablet by mouth 2 (two) times daily as needed. Must last 30 days. Start taking on: October 24, 2023 What changed:  Another medication with the same name was added. Make sure you understand how and when to take each. Another medication with the same name was changed. Make sure you understand how and when to take each.   ipratropium 0.06 % nasal spray Commonly known as: ATROVENT  Place 2 sprays into both nostrils 4 (four) times daily.   ipratropium-albuterol  0.5-2.5 (3) MG/3ML Soln Commonly known as: DUONEB Take 3 mLs by nebulization every 4 (four) hours as needed (SOB/Wheezing/Asthma).   lisinopril  10 MG tablet Commonly known as: ZESTRIL  Take 1 tablet (10 mg total) by mouth daily. What changed: how much to take   lubiprostone  8 MCG capsule Commonly known as: AMITIZA  Take 8 mcg by mouth 2 (two) times daily  with a meal.   Magnesium  Oxide -Mg Supplement 500 MG Tabs Take 500 mg by mouth daily.   metFORMIN  500 MG 24 hr tablet Commonly known as: GLUCOPHAGE -XR Take 2 tablets (1,000 mg total) by mouth 2 (two) times daily with a meal.   montelukast  10 MG tablet Commonly known as: SINGULAIR  Take 10 mg by mouth daily as needed (Allergies).   Mounjaro 10 MG/0.5ML Pen Generic drug: tirzepatide Inject 10 mg into the skin once a week.   multivitamin tablet Take 1 tablet by mouth daily. Mega Men   naloxone  4 MG/0.1ML Liqd nasal spray kit Commonly known as: NARCAN  Place 1 spray into the nose as needed for up to 365 doses (for opioid-induced respiratory depresssion). In case of emergency (overdose), spray once into each nostril. If no response within 3 minutes, repeat application and call 911.   pantoprazole  40 MG tablet Commonly known as: PROTONIX  Take 40 mg by mouth 2 (two) times daily.   PRESCRIPTION MEDICATION Pt has CPAP Machine   ProAir  HFA 108 (90 Base) MCG/ACT inhaler Generic drug: albuterol  Inhale 2 puffs into the lungs every 6 (six) hours as needed for  wheezing or shortness of breath.   rosuvastatin  40 MG tablet Commonly known as: CRESTOR  TAKE 1 TABLET(40 MG) BY MOUTH DAILY What changed: See the new instructions.   tazarotene  0.1 % cream Commonly known as: AVAGE  Apply topically at bedtime. qhs to face and chest for acne   testosterone  cypionate 200 MG/ML injection Commonly known as: DEPOTESTOSTERONE CYPIONATE Inject 1 cc every 10 days.   Vascepa 1 g capsule Generic drug: icosapent Ethyl Take 2 g by mouth 2 (two) times daily.        Follow-up Information     Nolic, [place], MD Follow up on 11/01/2023.   Specialty: General Surgery Contact information: [address] [place] [address] [phone]         Dennise Capri, MD Follow up in 3 week(s).   Specialty: Nephrology Contact information: [address] [place] [address] [phone]                  Laneta Luna, MD FACS

## 2023-10-20 NOTE — TOC CM/SW Note (Signed)
 CSW attempted to meet with patient to complete high risk assessment. Attending was in with patient. Assessment was not completed.

## 2023-10-20 NOTE — Progress Notes (Signed)
 Pharmacy Antibiotic Note  Darren Allen is a 58 y.o. male admitted on 10/17/2023 with wound infection (infected hernia repair mesh). Pharmacy has been consulted for Vancomycin  dosing.   -also on zosyn   Plan: Scr decrease  1.93>> 1.53 Will adjust Vancomycin  from 1750 mg to 2000 mg IV Q 24 hrs. Goal AUC 400-550. Expected AUC: 475 SCr used: 1.53 Cmin 10.8 Vd: 0.72   Pharmacy will monitor renal function and adjust dose as needed.    Height: 6' 2 (188 cm) Weight: 108.4 kg (239 lb 1.1 oz) IBW/kg (Calculated) : 82.2  Temp (24hrs), Avg:98.1 F (36.7 C), Min:97.6 F (36.4 C), Max:98.5 F (36.9 C)  Recent Labs  Lab 10/17/23 0859 10/18/23 0438 10/18/23 0825 10/19/23 0422 10/20/23 0350  WBC 6.9  --   --   --   --   CREATININE 1.74* 2.52*  --  1.93* 1.53*  VANCORANDOM  --   --  11  --   --     Estimated Creatinine Clearance: 67.3 mL/min (A) (by C-G formula based on SCr of 1.53 mg/dL (H)).    Allergies  Allergen Reactions   Dupixent [Dupilumab] Rash   Glipizide Palpitations and Other (See Comments)    Shaky, feel bad Other reaction(s): Dizziness   Ciprofloxacin  Itching    Caused itching and burning    Cephalexin  Rash    Received IV rocephin , zosyn  w/o issues   Duloxetine Anxiety and Nausea Only    Antimicrobials this admission: 8/3 Zosyn   >>  8/3 Vancomycin  >>   Dose adjustments this admission: 8/4 Vanc 1750 >>1250mg  q24h 8/5 Vanc 1250 >>1750 mg q24h 8/6 Vanc 1750 >> 2000 mg q24h  Microbiology results: Abd wall fluid cx 8/5:  GPC Abd mesh tissue cx 8/5: NG<12hr  Thank you for allowing pharmacy to be a part of this patient's care.  Suzann Allean DELENA, PharmD Clinical Pharmacist 10/20/2023 11:08 AM

## 2023-10-22 ENCOUNTER — Emergency Department
Admission: EM | Admit: 2023-10-22 | Discharge: 2023-10-22 | Disposition: A | Discharge status: Home / Self Care | Attending: Emergency Medicine | Admitting: Emergency Medicine

## 2023-10-22 ENCOUNTER — Emergency Department

## 2023-10-22 ENCOUNTER — Other Ambulatory Visit: Payer: Self-pay

## 2023-10-22 DIAGNOSIS — I959 Hypotension, unspecified: Secondary | ICD-10-CM | POA: Diagnosis present

## 2023-10-22 DIAGNOSIS — R1031 Right lower quadrant pain: Secondary | ICD-10-CM | POA: Insufficient documentation

## 2023-10-22 LAB — CBC WITH DIFFERENTIAL/PLATELET
Abs Immature Granulocytes: 0.05 K/uL (ref 0.00–0.07)
Basophils Absolute: 0.1 K/uL (ref 0.0–0.1)
Basophils Relative: 1 %
Eosinophils Absolute: 0.4 K/uL (ref 0.0–0.5)
Eosinophils Relative: 5 %
HCT: 36.1 % — ABNORMAL LOW (ref 39.0–52.0)
Hemoglobin: 11.1 g/dL — ABNORMAL LOW (ref 13.0–17.0)
Immature Granulocytes: 1 %
Lymphocytes Relative: 21 %
Lymphs Abs: 1.6 K/uL (ref 0.7–4.0)
MCH: 23.7 pg — ABNORMAL LOW (ref 26.0–34.0)
MCHC: 30.7 g/dL (ref 30.0–36.0)
MCV: 77 fL — ABNORMAL LOW (ref 80.0–100.0)
Monocytes Absolute: 1 K/uL (ref 0.1–1.0)
Monocytes Relative: 14 %
Neutro Abs: 4.3 K/uL (ref 1.7–7.7)
Neutrophils Relative %: 58 %
Platelets: 281 K/uL (ref 150–400)
RBC: 4.69 MIL/uL (ref 4.22–5.81)
RDW: 17.2 % — ABNORMAL HIGH (ref 11.5–15.5)
WBC: 7.3 K/uL (ref 4.0–10.5)
nRBC: 0 % (ref 0.0–0.2)

## 2023-10-22 LAB — COMPREHENSIVE METABOLIC PANEL WITH GFR
ALT: 28 U/L (ref 0–44)
AST: 27 U/L (ref 15–41)
Albumin: 3.9 g/dL (ref 3.5–5.0)
Alkaline Phosphatase: 42 U/L (ref 38–126)
Anion gap: 12 (ref 5–15)
BUN: 29 mg/dL — ABNORMAL HIGH (ref 6–20)
CO2: 21 mmol/L — ABNORMAL LOW (ref 22–32)
Calcium: 8.8 mg/dL — ABNORMAL LOW (ref 8.9–10.3)
Chloride: 104 mmol/L (ref 98–111)
Creatinine, Ser: 2.24 mg/dL — ABNORMAL HIGH (ref 0.61–1.24)
GFR, Estimated: 33 mL/min — ABNORMAL LOW (ref >60)
Glucose, Bld: 180 mg/dL — ABNORMAL HIGH (ref 70–99)
Potassium: 3.7 mmol/L (ref 3.5–5.1)
Sodium: 137 mmol/L (ref 135–145)
Total Bilirubin: 0.6 mg/dL (ref 0.0–1.2)
Total Protein: 7.1 g/dL (ref 6.5–8.1)

## 2023-10-22 LAB — URINALYSIS, W/ REFLEX TO CULTURE (INFECTION SUSPECTED)
Bacteria, UA: NONE SEEN
Bilirubin Urine: NEGATIVE
Glucose, UA: >=500 mg/dL — AB
Hgb urine dipstick: NEGATIVE
Ketones, ur: 5 mg/dL — AB
Leukocytes,Ua: NEGATIVE
Nitrite: NEGATIVE
Protein, ur: NEGATIVE mg/dL
Specific Gravity, Urine: 1.024 (ref 1.005–1.030)
pH: 5 (ref 5.0–8.0)

## 2023-10-22 LAB — LACTIC ACID, PLASMA: Lactic Acid, Venous: 1.9 mmol/L (ref 0.5–1.9)

## 2023-10-22 LAB — SURGICAL PATHOLOGY

## 2023-10-22 MED ORDER — LACTATED RINGERS IV BOLUS
1000.0000 mL | Freq: Once | INTRAVENOUS | Status: AC
  Administered 2023-10-22: 1000 mL via INTRAVENOUS

## 2023-10-22 MED ORDER — IOHEXOL 300 MG/ML  SOLN
80.0000 mL | Freq: Once | INTRAMUSCULAR | Status: AC | PRN
  Administered 2023-10-22: 80 mL via INTRAVENOUS

## 2023-10-22 MED ORDER — DOXYCYCLINE HYCLATE 100 MG PO TABS: 100.0000 mg | ORAL_TABLET | Freq: Two times a day (BID) | ORAL | 0 refills | Status: AC

## 2023-10-22 NOTE — ED Triage Notes (Signed)
 Patient arrives via EMS from home C/O possible hypotension. Family reported a low BP of 75/44. EMS BP value was 117/63 on arrival. Patient received 500 of NS and 4mg  of zofran  from EMS. O Of note, patient had hernia repair surgery on Monday. Patient endorses abdominal pain and swelling as well as nausea. Denies any fevers at home.

## 2023-10-22 NOTE — ED Notes (Signed)
 Orthostatic vital signs:   Laying: 83/53 HR 82 Sitting: 83/56 HR 89 Standing: 82/54 HR 101  Endorsing dizziness with all position changes. Abdominal pain increased with sitting and standing.

## 2023-10-22 NOTE — ED Provider Notes (Signed)
 [facility] Provider Note   Event Date/Time   First MD Initiated Contact with Patient 10/22/23 2101     (approximate) History  No chief complaint on file.  HPI MD SMOLA is a 58 y.o. male with a recent past surgical history of robotic hernia repair with dressing still in place to incision who presents complaining of right lower quadrant abdominal pain with associated hypotension.  Patient states that he had been feeling lightheaded over the last few hours and when presented to the fire department found his blood pressure to be 70s/40s.  Patient was administered 1 L of fluid prior to arrival with improvement of his blood pressure into the 110s/70s.  Patient now states that he has right lower quadrant abdominal pain and that has mildly worsened since surgery. ROS: Patient currently denies any vision changes, tinnitus, difficulty speaking, facial droop, sore throat, chest pain, shortness of breath, abdominal pain, nausea/vomiting/diarrhea, dysuria, or weakness/numbness/paresthesias in any extremity   Physical Exam  Triage Vital Signs: ED Triage Vitals  Encounter Vitals Group     BP 10/22/23 2109 99/68     Girls Systolic BP Percentile --      Girls Diastolic BP Percentile --      Boys Systolic BP Percentile --      Boys Diastolic BP Percentile --      Pulse Rate 10/22/23 2105 92     Resp 10/22/23 2105 18     Temp 10/22/23 2105 98.6 F (37 C)     Temp Source 10/22/23 2105 Oral     SpO2 10/22/23 2105 99 %     Weight --      Height --      Head Circumference --      Peak Flow --      Pain Score 10/22/23 2105 4     Pain Loc --      Pain Education --      Exclude from Growth Chart --    Most recent vital signs: Vitals:   10/22/23 2109 10/22/23 2258  BP: 99/68 97/75  Pulse: 93 81  Resp: 11 20  Temp:  98.6 F (37 C)  SpO2: 98% 100%   General: Awake, oriented x4. CV:  Good peripheral perfusion. Resp:  Normal effort. Abd:  No distention.  Right  lower quadrant tenderness to palpation Other:  Middle-aged obese Caucasian male resting comfortably in no acute distress.  Incision site clean dry and intact ED Results / Procedures / Treatments  Labs (all labs ordered are listed, but only abnormal results are displayed) Labs Reviewed  COMPREHENSIVE METABOLIC PANEL WITH GFR - Abnormal; Notable for the following components:      Result Value   CO2 21 (*)    Glucose, Bld 180 (*)    BUN 29 (*)    Creatinine, Ser 2.24 (*)    Calcium  8.8 (*)    GFR, Estimated 33 (*)    All other components within normal limits  CBC WITH DIFFERENTIAL/PLATELET - Abnormal; Notable for the following components:   Hemoglobin 11.1 (*)    HCT 36.1 (*)    MCV 77.0 (*)    MCH 23.7 (*)    RDW 17.2 (*)    All other components within normal limits  URINALYSIS, W/ REFLEX TO CULTURE (INFECTION SUSPECTED) - Abnormal; Notable for the following components:   Color, Urine AMBER (*)    APPearance HAZY (*)    Glucose, UA >=500 (*)    Ketones, ur 5 (*)  All other components within normal limits  CULTURE, BLOOD (ROUTINE X 2)  CULTURE, BLOOD (ROUTINE X 2)  LACTIC ACID, PLASMA   EKG ED ECG REPORT I, Artist MARLA Kerns, the attending physician, personally viewed and interpreted this ECG. Date: 10/22/2023 EKG Time: 2109 Rate: 92 Rhythm: normal sinus rhythm QRS Axis: normal Intervals: normal ST/T Wave abnormalities: normal Narrative Interpretation: no evidence of acute ischemia RADIOLOGY ED MD interpretation: CT of the abdomen pelvis with IV contrast independently interpreted and shows progressive infiltration of the subcutaneous fat of the anterior abdominal wall in the region of the previously noted loculated gas collection which is nonspecific can be seen in the setting of local inflammation as well as cellulitis - All radiology independently interpreted and agree with radiology assessment Official radiology report(s): CT ABDOMEN PELVIS W CONTRAST Result Date:  10/22/2023 CLINICAL DATA:  Hypotension, right lower quadrant abdominal pain, status post hernia repair EXAM: CT ABDOMEN AND PELVIS WITH CONTRAST TECHNIQUE: Multidetector CT imaging of the abdomen and pelvis was performed using the standard protocol following bolus administration of intravenous contrast. RADIATION DOSE REDUCTION: This exam was performed according to the departmental dose-optimization program which includes automated exposure control, adjustment of the mA and/or kV according to patient size and/or use of iterative reconstruction technique. CONTRAST:  80mL OMNIPAQUE  IOHEXOL  300 MG/ML  SOLN COMPARISON:  10/10/2023 FINDINGS: Lower chest: No acute abnormality.  Small hiatal hernia Hepatobiliary: No focal liver abnormality is seen. No gallstones, gallbladder wall thickening, or biliary dilatation. Pancreas: Unremarkable Spleen: Unremarkable Adrenals/Urinary Tract: The adrenal glands are unremarkable. The kidneys are normal in size and position. Stable nonobstructing 4 mm calculus within the left ureterovesicular junction. No hydronephrosis. No additional intrarenal or ureteral calculi. No perinephric inflammatory stranding or fluid collections are seen. The bladder is decompressed and is otherwise unremarkable. Stomach/Bowel: Severe descending and sigmoid diverticulosis. Stomach, small bowel, and large bowel are otherwise unremarkable. Appendix absent. No evidence of obstruction or focal inflammation. No free intraperitoneal fluid. The previously identified thick-walled collection of gas within the anterior peritoneum has nearly completely resolved with a tiny, punctate focus of residual intraperitoneal gas identified (50/2) and moderate residual inflammatory stranding within this region. No discrete drainable fluid collection. Punctate foci of gas are now seen within the subcutaneous fat of the anterior abdominal wall superficial to this region likely representing necessitation of previously noted  intraperitoneal gas. Vascular/Lymphatic: Aortic atherosclerosis. No enlarged abdominal or pelvic lymph nodes. Reproductive: Prostate is unremarkable. Other: There is progressive infiltration of the subcutaneous fat of the anterior abdominal wall in the region of the previously noted loculated gas collection which is nonspecific, but can be seen the setting of local inflammation as can be seen with cellulitis. This is best seen on image # 39/2. No recurrent abdominal wall hernia. Musculoskeletal: No acute or significant osseous findings. IMPRESSION: 1. Near complete resolution of previously noted thick-walled collection of gas within the anterior peritoneum with moderate residual inflammatory stranding within this region. No discrete drainable fluid collection. 2. Progressive infiltration of the subcutaneous fat of the anterior abdominal wall in the region of the previously noted loculated gas collection which is nonspecific, but can be seen the setting of local inflammation as can be seen with cellulitis. Correlation with clinical examination is recommended. 3. Stable nonobstructing 4 mm calculus within the left ureterovesicular junction. No hydronephrosis. 4. Severe distal colonic diverticulosis without superimposed acute inflammatory change. 5. Aortic atherosclerosis. Aortic Atherosclerosis (ICD10-I70.0). Electronically Signed   By: Dorethia Molt M.D.   On: 10/22/2023 23:00  PROCEDURES: Critical Care performed: No Procedures MEDICATIONS ORDERED IN ED: Medications  lactated ringers  bolus 1,000 mL (1,000 mLs Intravenous New Bag/Given 10/22/23 2228)  iohexol  (OMNIPAQUE ) 300 MG/ML solution 80 mL (80 mLs Intravenous Contrast Given 10/22/23 2236)   IMPRESSION / MDM / ASSESSMENT AND PLAN / ED COURSE  I reviewed the triage vital signs and the nursing notes.                             The patient is on the cardiac monitor to evaluate for evidence of arrhythmia and/or significant heart rate changes. Patient's  presentation is most consistent with acute presentation with potential threat to life or bodily function. Presentation most consistent with simple cellulitis. Given History, Exam, and Workup I have low suspicion for Necrotizing Fasciitis, Abscess, Osteomyelitis, DVT or other emergent problem as a cause for this presentation.  Rx: Doxycycline  100 mg twice daily x10 days  Disposition: Discharge. No evidence of serious bacterial illness. Nontoxic appearing, VSS. Low risk for treatment failure based on history. Strict return precautions discussed with patient with full understanding. Advised patient to follow up promptly with primary care provider within next 48 hours.   FINAL CLINICAL IMPRESSION(S) / ED DIAGNOSES   Final diagnoses:  Transient hypotension  Right lower quadrant abdominal pain   Rx / DC Orders   ED Discharge Orders          Ordered    doxycycline  (VIBRA -TABS) 100 MG tablet  2 times daily        10/22/23 2321           Note:  This document was prepared using Dragon voice recognition software and may include unintentional dictation errors.   Jossie Artist POUR, MD 10/22/23 2322

## 2023-10-22 NOTE — Discharge Instructions (Signed)
 Please hold your hydrochlorothiazide  medication for the next 3 days

## 2023-10-24 LAB — AEROBIC/ANAEROBIC CULTURE W GRAM STAIN (SURGICAL/DEEP WOUND): Gram Stain: NONE SEEN

## 2023-10-24 NOTE — Anesthesia Postprocedure Evaluation (Signed)
 Anesthesia Post Note  Patient: Kenon A Stribling  Procedure(s) Performed: REMOVAL, MESH, ABDOMEN OR PELVIS REPAIR, HERNIA, VENTRAL  Patient location during evaluation: PACU Anesthesia Type: General Level of consciousness: awake and alert Pain management: pain level controlled Vital Signs Assessment: post-procedure vital signs reviewed and stable Respiratory status: spontaneous breathing, nonlabored ventilation, respiratory function stable and patient connected to nasal cannula oxygen  Cardiovascular status: blood pressure returned to baseline and stable Postop Assessment: no apparent nausea or vomiting Anesthetic complications: no   There were no known notable events for this encounter.   Last Vitals:  Vitals:   10/20/23 0400 10/20/23 0857  BP: 133/79 130/76  Pulse: 75 81  Resp: 20 20  Temp: 36.7 C 36.7 C  SpO2: 98% 100%    Last Pain:  Vitals:   10/20/23 0857  TempSrc: Oral  PainSc:                  Prentice Murphy

## 2023-10-25 ENCOUNTER — Ambulatory Visit (INDEPENDENT_AMBULATORY_CARE_PROVIDER_SITE_OTHER): Admitting: Surgery

## 2023-10-25 ENCOUNTER — Encounter: Payer: Self-pay | Admitting: Surgery

## 2023-10-25 ENCOUNTER — Encounter: Admitting: Surgery

## 2023-10-25 VITALS — BP 146/82 | HR 88 | Temp 98.4°F | Ht 74.0 in | Wt 234.6 lb

## 2023-10-25 DIAGNOSIS — K432 Incisional hernia without obstruction or gangrene: Secondary | ICD-10-CM | POA: Diagnosis not present

## 2023-10-25 DIAGNOSIS — Z09 Encounter for follow-up examination after completed treatment for conditions other than malignant neoplasm: Secondary | ICD-10-CM | POA: Diagnosis not present

## 2023-10-25 MED ORDER — CEFPROZIL 500 MG PO TABS: 250.0000 mg | ORAL_TABLET | Freq: Two times a day (BID) | ORAL | 0 refills | Status: DC

## 2023-10-25 NOTE — Patient Instructions (Signed)

## 2023-10-25 NOTE — Progress Notes (Signed)
 Outpatient Surgical Follow Up  10/25/2023  Darren Allen is an 58 y.o. male.   Chief Complaint  Patient presents with   Routine Post Op    Excision debridment of skin and fascia 10/13/23    HPI: s/p mesh excision for drainage, cultures + staph and E coli. Sens to cepha and cipro . Did have an episode of hypotension that required IV fluids and a visit to the emergency room.  Since then he has been holding his blood pressure medication.  No fevers no chills.  he feels weak. Given a prescription for doxy. Taking po no fevers, CT pers reviewed showing resolution of collection around mesh, no Other acute abnormalities. He did have creat 2.2, cbc nml  Past Medical History:  Diagnosis Date   Allergy    Anxiety    Asthma    Chronic kidney disease    Chronic lower back pain    a.) followed by pain management; on COT   COPD (chronic obstructive pulmonary disease) (HCC)    Coronary artery disease    a.) cCTA 06/30/2021: Ca2+ = 216 (84th %ile; 25049% pRCA and pLAD))   DDD (degenerative disc disease), lumbosacral    Depression    Diabetic peripheral neuropathy (HCC)    Diastolic dysfunction 12/27/2020   a.) TTE 12/27/2020: EF >55%, no RWMAs, G1DD, norm RVSF, triv MR/TR?PR   Diverticulosis    Emphysema of lung (HCC)    Erectile dysfunction    a.) on PDE5i (tadalafil )   GERD (gastroesophageal reflux disease)    Hepatic steatosis    History of kidney stones    Hyperlipidemia    Hypertension    Hypogonadism male    a.) on exogenous TRT (depotestosterone cypionate)   IDA (iron deficiency anemia)    Long term current use of opiate analgesic    a.) followed by pain management; naloxone  Rx available   OSA on CPAP    Osteoporosis    Paraesophageal hernia    a.) s/p robotic assisted repair 01/2022   PVD (peripheral vascular disease) (HCC)    Sleep apnea    T2DM (type 2 diabetes mellitus) (HCC)    Umbilical hernia    a.) s/p repair 01/20/2022   Ventral hernia     Past Surgical  History:  Procedure Laterality Date   ABDOMINAL HYSTERECTOMY     APPENDECTOMY     BRONCHOSCOPY  2016   CIRCUMCISION     COLONOSCOPY WITH PROPOFOL  N/A 12/31/2021   Procedure: COLONOSCOPY WITH PROPOFOL ;  Surgeon: Therisa Bi, MD;  Location: [facility] ENDOSCOPY;  Service: Gastroenterology;  Laterality: N/A;   ESOPHAGOGASTRODUODENOSCOPY N/A 12/31/2021   Procedure: ESOPHAGOGASTRODUODENOSCOPY (EGD);  Surgeon: Therisa Bi, MD;  Location: [facility] ENDOSCOPY;  Service: Gastroenterology;  Laterality: N/A;   EXCISION OF MESH N/A 10/19/2023   Procedure: REMOVAL, MESH, ABDOMEN OR PELVIS;  Surgeon: Jordis Laneta FALCON, MD;  Location: [facility] ORS;  Service: General;  Laterality: N/A;   HERNIA REPAIR  07/2023   INCISION AND DRAINAGE PERIRECTAL ABSCESS N/A 02/04/2015   Procedure: IRRIGATION AND DEBRIDEMENT PERIRECTAL ABSCESS;  Surgeon: Lonni Brands, MD;  Location: [facility] ORS;  Service: General;  Laterality: N/A;   INSERTION OF MESH  01/20/2022   Procedure: INSERTION OF MESH;  Surgeon: Jordis Laneta FALCON, MD;  Location: [facility] ORS;  Service: General;;   INSERTION OF MESH N/A 04/08/2023   Procedure: INSERTION OF MESH;  Surgeon: Jordis Laneta FALCON, MD;  Location: [facility] ORS;  Service: General;  Laterality: N/A;   KIDNEY STONE SURGERY Right  LEFT HEART CATH AND CORONARY ANGIOGRAPHY Left 08/16/2023   Procedure: LEFT HEART CATH AND CORONARY ANGIOGRAPHY;  Surgeon: Florencio Cara BIRCH, MD;  Location: [facility] INVASIVE CV LAB;  Service: Cardiovascular;  Laterality: Left;   lung mass removal N/A    MUSCLE BIOPSY     ORIF ANKLE FRACTURE Left 03/23/2017   Procedure: OPEN REDUCTION INTERNAL FIXATION (ORIF) ANKLE FRACTURE;  Surgeon: Tobie Priest, MD;  Location: [facility] ORS;  Service: Orthopedics;  Laterality: Left;   RECTAL EXAM UNDER ANESTHESIA  02/04/2015   Procedure: RECTAL EXAM UNDER ANESTHESIA;  Surgeon: Lonni Brands, MD;  Location: [facility] ORS;  Service: General;;   SYNDESMOSIS REPAIR Left 03/23/2017   Procedure: SYNDESMOSIS REPAIR;   Surgeon: Tobie Priest, MD;  Location: [facility] ORS;  Service: Orthopedics;  Laterality: Left;   UMBILICAL HERNIA REPAIR  01/20/2022   Procedure: HERNIA REPAIR UMBILICAL ADULT;  Surgeon: Jordis Laneta FALCON, MD;  Location: [facility] ORS;  Service: General;;   VASECTOMY     VENTRAL HERNIA REPAIR N/A 04/08/2023   Procedure: HERNIA REPAIR VENTRAL ADULT, open;  Surgeon: Jordis Laneta FALCON, MD;  Location: [facility] ORS;  Service: General;  Laterality: N/A;   VENTRAL HERNIA REPAIR N/A 10/13/2023   Procedure: EXCISIONAL DEBRIDEMENT OF SKIN AND FASCIA;  Surgeon: Jordis Laneta FALCON, MD;  Location: [facility] ORS;  Service: General;  Laterality: N/A;  open procedure w/mesh   VENTRAL HERNIA REPAIR N/A 10/19/2023   Procedure: REPAIR, HERNIA, VENTRAL;  Surgeon: Jordis Laneta FALCON, MD;  Location: [facility] ORS;  Service: General;  Laterality: N/A;   XI ROBOTIC ASSISTED PARAESOPHAGEAL HERNIA REPAIR N/A 01/20/2022   Procedure: XI ROBOTIC ASSISTED PARAESOPHAGEAL HERNIA REPAIR, CONVERTED TO OPEN, RNFA to assist;  Surgeon: Jordis Laneta FALCON, MD;  Location: [facility] ORS;  Service: General;  Laterality: N/A;    Family History  Problem Relation Age of Onset   Lung cancer Mother 74   Cancer Mother    Colon cancer Father 45   Heart disease Father    Alcohol abuse Father    Cancer Father    Esophageal cancer Brother 13   Diabetes Brother    Cancer Brother    Kidney disease Brother    Heart disease Brother    Lung cancer Maternal Aunt    Cancer Paternal Aunt        unk type   Cancer Maternal Grandfather        unk type   Drug abuse Daughter     Social History:  reports that he has quit smoking. His smoking use included cigarettes. He has a 17 pack-year smoking history. He has been exposed to tobacco smoke. He uses smokeless tobacco. He reports that he does not currently use alcohol after a past usage of about 12.0 standard drinks of alcohol per week. He reports current drug use. Drug: Hydrocodone .  Allergies:  Allergies  Allergen Reactions   Dupixent  [Dupilumab] Rash   Glipizide Palpitations and Other (See Comments)    Shaky, feel bad Other reaction(s): Dizziness   Ciprofloxacin  Itching    Caused itching and burning    Cephalexin  Rash    Received IV rocephin , zosyn  w/o issues   Duloxetine Anxiety and Nausea Only    Medications reviewed.    ROS Full ROS performed and is otherwise negative other than what is stated in HPI   BP (!) 146/82   Pulse 88   Temp 98.4 F (36.9 C) (Oral)   Ht 6' 2 (1.88 m)   Wt 234 lb 9.6 oz (106.4 kg)  SpO2 98%   BMI 30.12 kg/m   Physical Exam Vitals and nursing note reviewed. Exam conducted with a chaperone present.  Constitutional:      Appearance: Normal appearance.  Pulmonary:     Effort: Pulmonary effort is normal.     Breath sounds: No stridor.  Abdominal:     General: Abdomen is flat. There is no distension.     Palpations: Abdomen is soft. There is no mass.     Tenderness: There is no abdominal tenderness. There is no guarding or rebound.     Hernia: No hernia is present.     Comments: Incision haling well w/o infection or drainage, no hernia recurrence   Musculoskeletal:     Cervical back: Normal range of motion and neck supple. No rigidity or tenderness.  Skin:    General: Skin is warm and dry.     Capillary Refill: Capillary refill takes less than 2 seconds.  Neurological:     General: No focal deficit present.     Mental Status: He is alert and oriented to person, place, and time.  Psychiatric:        Mood and Affect: Mood normal.        Behavior: Behavior normal.        Thought Content: Thought content normal.      Assessment/Plan: Mesh infection , final micro E coli. Will give second gen cephalosporin . HE does have some side effects to cipro  but has tolerated cephalosporin in the past We will prescribe Cefprozil , if unable to tolerate may do cefuroxime. Dose adjusted HE has appt w Nephrology in a couple of days Advice to stop BP meds HE is doing otherwise ok  and does not need hospitalization I personally spent a total of 30 minutes in the care of the patient today including performing a medically appropriate exam/evaluation, counseling and educating, placing orders, referring and communicating with other health care professionals, documenting clinical information in the EHR, independently interpreting and reviewing images studies and coordinating care.   Laneta Luna, MD [facility] General Surgeon

## 2023-10-26 LAB — AEROBIC/ANAEROBIC CULTURE W GRAM STAIN (SURGICAL/DEEP WOUND)

## 2023-10-27 DIAGNOSIS — N1831 Chronic kidney disease, stage 3a: Secondary | ICD-10-CM | POA: Insufficient documentation

## 2023-10-27 LAB — CULTURE, BLOOD (ROUTINE X 2)
Culture: NO GROWTH
Culture: NO GROWTH

## 2023-10-31 ENCOUNTER — Encounter: Payer: Self-pay | Admitting: Nurse Practitioner

## 2023-11-02 ENCOUNTER — Encounter: Payer: Self-pay | Admitting: Nurse Practitioner

## 2023-11-02 ENCOUNTER — Ambulatory Visit
Admission: RE | Admit: 2023-11-02 | Discharge: 2023-11-02 | Disposition: A | Discharge status: Home / Self Care | Source: Ambulatory Visit | Attending: Nurse Practitioner

## 2023-11-02 ENCOUNTER — Ambulatory Visit
Admission: RE | Admit: 2023-11-02 | Discharge: 2023-11-02 | Disposition: A | Discharge status: Home / Self Care | Source: Ambulatory Visit | Attending: Nurse Practitioner | Admitting: Nurse Practitioner

## 2023-11-02 ENCOUNTER — Ambulatory Visit (INDEPENDENT_AMBULATORY_CARE_PROVIDER_SITE_OTHER): Admitting: Nurse Practitioner

## 2023-11-02 VITALS — BP 124/73 | HR 78 | Temp 98.1°F | Wt 241.6 lb

## 2023-11-02 DIAGNOSIS — M25512 Pain in left shoulder: Secondary | ICD-10-CM | POA: Insufficient documentation

## 2023-11-02 DIAGNOSIS — G8929 Other chronic pain: Secondary | ICD-10-CM

## 2023-11-02 DIAGNOSIS — M25511 Pain in right shoulder: Secondary | ICD-10-CM | POA: Insufficient documentation

## 2023-11-02 NOTE — Progress Notes (Signed)
 BP 124/73   Pulse 78   Temp 98.1 F (36.7 C) (Oral)   Wt 241 lb 9.6 oz (109.6 kg)   SpO2 98%   BMI 31.02 kg/m    Subjective:    Patient ID: Darren Allen, male    DOB: [birth year], 58 y.o.   MRN: [id number]  HPI: Darren Allen is a 58 y.o. male  Chief Complaint  Patient presents with   Arm Pain    Both arms left arm is worse than the right burning and numbness running down arm    Patient presents to clinic with complaints of both arms burning and numbness running down the arms.  States the left arm is worse than the right.  States it goes down his arm and is in the back of his shoulder.  The pain in the right arm started in June.  He isn't able to throw a ball with his dog.  The left arm pain started 3 days ago.  When the pain kicks in, he can barely raise his He recently had mesh removed from a hernia repair.  The results showed that it had ecoli.  Patient feels like it has infected his whole body and that is the cause of his pain.  He had recurrent infections with the mesh over the last several months.      Relevant past medical, surgical, family and social history reviewed and updated as indicated. Interim medical history since our last visit reviewed. Allergies and medications reviewed and updated.  Review of Systems  Musculoskeletal:        Shoulder and arm pain  Neurological:  Positive for numbness.    Per HPI unless specifically indicated above     Objective:    BP 124/73   Pulse 78   Temp 98.1 F (36.7 C) (Oral)   Wt 241 lb 9.6 oz (109.6 kg)   SpO2 98%   BMI 31.02 kg/m   Wt Readings from Last 3 Encounters:  11/02/23 241 lb 9.6 oz (109.6 kg)  10/25/23 234 lb 9.6 oz (106.4 kg)  10/19/23 239 lb 1.1 oz (108.4 kg)    Physical Exam Vitals and nursing note reviewed.  Constitutional:      General: He is not in acute distress.    Appearance: Normal appearance. He is not ill-appearing, toxic-appearing or diaphoretic.  HENT:     Head: Normocephalic.      Right Ear: External ear normal.     Left Ear: External ear normal.     Nose: Nose normal. No congestion or rhinorrhea.     Mouth/Throat:     Mouth: Mucous membranes are moist.  Eyes:     General:        Right eye: No discharge.        Left eye: No discharge.     Extraocular Movements: Extraocular movements intact.     Conjunctiva/sclera: Conjunctivae normal.     Pupils: Pupils are equal, round, and reactive to light.  Cardiovascular:     Rate and Rhythm: Normal rate and regular rhythm.     Heart sounds: No murmur heard. Pulmonary:     Effort: Pulmonary effort is normal. No respiratory distress.     Breath sounds: Normal breath sounds. No wheezing, rhonchi or rales.  Abdominal:     General: Abdomen is flat. Bowel sounds are normal.  Musculoskeletal:     Right shoulder: No swelling, deformity, effusion, laceration, tenderness, bony tenderness or crepitus. Decreased range of motion. Normal strength.  Normal pulse.     Left shoulder: No swelling, deformity, effusion, laceration, tenderness, bony tenderness or crepitus. Decreased range of motion. Normal strength. Normal pulse.     Cervical back: Normal range of motion and neck supple.  Skin:    General: Skin is warm and dry.     Capillary Refill: Capillary refill takes less than 2 seconds.  Neurological:     General: No focal deficit present.     Mental Status: He is alert and oriented to person, place, and time.  Psychiatric:        Mood and Affect: Mood normal.        Behavior: Behavior normal.        Thought Content: Thought content normal.        Judgment: Judgment normal.     Results for orders placed or performed during the hospital encounter of 10/22/23  Blood culture (routine x 2)   Collection Time: 10/22/23  9:12 PM   Specimen: BLOOD  Result Value Ref Range   Specimen Description BLOOD BLOOD LEFT FOREARM    Special Requests      BOTTLES DRAWN AEROBIC AND ANAEROBIC Blood Culture results may not be optimal due to an  inadequate volume of blood received in culture bottles   Culture      NO GROWTH 5 DAYS Performed at [facility], [address] [place], [place] [address]    Report Status 10/27/2023 FINAL   Lactic acid, plasma   Collection Time: 10/22/23  9:12 PM  Result Value Ref Range   Lactic Acid, Venous 1.9 0.5 - 1.9 mmol/L  Comprehensive metabolic panel   Collection Time: 10/22/23  9:12 PM  Result Value Ref Range   Sodium 137 135 - 145 mmol/L   Potassium 3.7 3.5 - 5.1 mmol/L   Chloride 104 98 - 111 mmol/L   CO2 21 (L) 22 - 32 mmol/L   Glucose, Bld 180 (H) 70 - 99 mg/dL   BUN 29 (H) 6 - 20 mg/dL   Creatinine, Ser 7.75 (H) 0.61 - 1.24 mg/dL   Calcium  8.8 (L) 8.9 - 10.3 mg/dL   Total Protein 7.1 6.5 - 8.1 g/dL   Albumin  3.9 3.5 - 5.0 g/dL   AST 27 15 - 41 U/L   ALT 28 0 - 44 U/L   Alkaline Phosphatase 42 38 - 126 U/L   Total Bilirubin 0.6 0.0 - 1.2 mg/dL   GFR, Estimated 33 (L) >60 mL/min   Anion gap 12 5 - 15  CBC with Differential   Collection Time: 10/22/23  9:12 PM  Result Value Ref Range   WBC 7.3 4.0 - 10.5 K/uL   RBC 4.69 4.22 - 5.81 MIL/uL   Hemoglobin 11.1 (L) 13.0 - 17.0 g/dL   HCT 63.8 (L) 60.9 - 47.9 %   MCV 77.0 (L) 80.0 - 100.0 fL   MCH 23.7 (L) 26.0 - 34.0 pg   MCHC 30.7 30.0 - 36.0 g/dL   RDW 82.7 (H) 88.4 - 84.4 %   Platelets 281 150 - 400 K/uL   nRBC 0.0 0.0 - 0.2 %   Neutrophils Relative % 58 %   Neutro Abs 4.3 1.7 - 7.7 K/uL   Lymphocytes Relative 21 %   Lymphs Abs 1.6 0.7 - 4.0 K/uL   Monocytes Relative 14 %   Monocytes Absolute 1.0 0.1 - 1.0 K/uL   Eosinophils Relative 5 %   Eosinophils Absolute 0.4 0.0 - 0.5 K/uL   Basophils Relative 1 %  Basophils Absolute 0.1 0.0 - 0.1 K/uL   Immature Granulocytes 1 %   Abs Immature Granulocytes 0.05 0.00 - 0.07 K/uL  Blood culture (routine x 2)   Collection Time: 10/22/23  9:13 PM   Specimen: BLOOD  Result Value Ref Range   Specimen Description BLOOD BLOOD LEFT FOREARM    Special Requests       BOTTLES DRAWN AEROBIC AND ANAEROBIC Blood Culture results may not be optimal due to an inadequate volume of blood received in culture bottles   Culture      NO GROWTH 5 DAYS Performed at [facility], [address] [place], [place] [address]    Report Status 10/27/2023 FINAL   Urinalysis, w/ Reflex to Culture (Infection Suspected) -Urine, Clean Catch   Collection Time: 10/22/23 10:24 PM  Result Value Ref Range   Specimen Source URINE, CLEAN CATCH    Color, Urine AMBER (A) YELLOW   APPearance HAZY (A) CLEAR   Specific Gravity, Urine 1.024 1.005 - 1.030   pH 5.0 5.0 - 8.0   Glucose, UA >=500 (A) NEGATIVE mg/dL   Hgb urine dipstick NEGATIVE NEGATIVE   Bilirubin Urine NEGATIVE NEGATIVE   Ketones, ur 5 (A) NEGATIVE mg/dL   Protein, ur NEGATIVE NEGATIVE mg/dL   Nitrite NEGATIVE NEGATIVE   Leukocytes,Ua NEGATIVE NEGATIVE   RBC / HPF 0-5 0 - 5 RBC/hpf   WBC, UA 0-5 0 - 5 WBC/hpf   Bacteria, UA NONE SEEN NONE SEEN   Squamous Epithelial / HPF 0-5 0 - 5 /HPF   Mucus PRESENT    Hyaline Casts, UA PRESENT       Assessment & Plan:   Problem List Items Addressed This Visit   None Visit Diagnoses       Chronic pain of both shoulders    -  Primary   Suspect arthritis and possible pinched nerve. Will obtain xrays and labs. Will make recommendations based on results.   Relevant Orders   Comp Met (CMET)   CBC w/Diff   DG Shoulder Left   DG Shoulder Right        Follow up plan: Return if symptoms worsen or fail to improve.

## 2023-11-03 ENCOUNTER — Ambulatory Visit: Payer: Self-pay | Admitting: Nurse Practitioner

## 2023-11-03 ENCOUNTER — Ambulatory Visit: Admission: RE | Admit: 2023-11-03 | Source: Ambulatory Visit

## 2023-11-03 ENCOUNTER — Encounter: Payer: Self-pay | Admitting: Nurse Practitioner

## 2023-11-03 LAB — COMPREHENSIVE METABOLIC PANEL WITH GFR
ALT: 30 IU/L (ref 0–44)
AST: 23 IU/L (ref 0–40)
Albumin: 4.4 g/dL (ref 3.8–4.9)
Alkaline Phosphatase: 55 IU/L (ref 44–121)
BUN/Creatinine Ratio: 11 (ref 10–24)
BUN: 12 mg/dL (ref 8–27)
Bilirubin Total: 0.4 mg/dL (ref 0.0–1.2)
CO2: 21 mmol/L (ref 20–29)
Calcium: 9.1 mg/dL (ref 8.6–10.2)
Chloride: 103 mmol/L (ref 96–106)
Creatinine, Ser: 1.07 mg/dL (ref 0.76–1.27)
Globulin, Total: 2.2 g/dL (ref 1.5–4.5)
Glucose: 127 mg/dL — ABNORMAL HIGH (ref 70–99)
Potassium: 4.2 mmol/L (ref 3.5–5.2)
Sodium: 140 mmol/L (ref 134–144)
Total Protein: 6.6 g/dL (ref 6.0–8.5)
eGFR: 79 mL/min/1.73 (ref >59)

## 2023-11-03 LAB — CBC WITH DIFFERENTIAL/PLATELET
Basophils Absolute: 0.1 x10E3/uL (ref 0.0–0.2)
Basos: 1 %
EOS (ABSOLUTE): 0.3 x10E3/uL (ref 0.0–0.4)
Eos: 5 %
Hematocrit: 38.7 % (ref 37.5–51.0)
Hemoglobin: 11.4 g/dL — ABNORMAL LOW (ref 13.0–17.7)
Immature Grans (Abs): 0 x10E3/uL (ref 0.0–0.1)
Immature Granulocytes: 0 %
Lymphocytes Absolute: 1.1 x10E3/uL (ref 0.7–3.1)
Lymphs: 17 %
MCH: 22.9 pg — ABNORMAL LOW (ref 26.6–33.0)
MCHC: 29.5 g/dL — ABNORMAL LOW (ref 31.5–35.7)
MCV: 78 fL — ABNORMAL LOW (ref 79–97)
Monocytes Absolute: 0.7 x10E3/uL (ref 0.1–0.9)
Monocytes: 12 %
Neutrophils Absolute: 4.1 x10E3/uL (ref 1.4–7.0)
Neutrophils: 65 %
Platelets: 292 x10E3/uL (ref 150–450)
RBC: 4.98 x10E6/uL (ref 4.14–5.80)
RDW: 16.5 % — ABNORMAL HIGH (ref 11.6–15.4)
WBC: 6.3 x10E3/uL (ref 3.4–10.8)

## 2023-11-10 ENCOUNTER — Encounter: Payer: Self-pay | Admitting: Surgery

## 2023-11-10 ENCOUNTER — Ambulatory Visit (INDEPENDENT_AMBULATORY_CARE_PROVIDER_SITE_OTHER): Admitting: Surgery

## 2023-11-10 VITALS — BP 147/79 | HR 99 | Temp 98.6°F | Ht 74.0 in | Wt 241.2 lb

## 2023-11-10 DIAGNOSIS — Z09 Encounter for follow-up examination after completed treatment for conditions other than malignant neoplasm: Secondary | ICD-10-CM | POA: Diagnosis not present

## 2023-11-10 DIAGNOSIS — K432 Incisional hernia without obstruction or gangrene: Secondary | ICD-10-CM

## 2023-11-10 NOTE — Patient Instructions (Signed)
 Removing Dead or Infected Tissue for Healing (Surgical Wound Debridement): What to Expect Surgical wound debridement is when dead and infected tissue is removed from a wound. This helps the wound heal better. Things you may need removed include: Dead tissue. Scar tissue. Fluid that has built up. Debris from outside your body. You may need this type of debridement if you have a lot of tissue that needs to be taken out, or if you have a wound that hasn't gotten better with other treatments. In most cases, it's done in an operating room. During the debridement, you may be given medicine to: Numb the affected body part. Put you to sleep. Tell a health care provider about: Any allergies you have. All medicines you take. These include vitamins, herbs, eye drops, and creams. Any problems you or family members have had with anesthesia. Any bleeding problems you have. Any surgeries you've had. Any medical problems you have. Whether you're pregnant or may be pregnant. What are the risks? Your health care provider will talk with you about risks. These may include: Bleeding. Infection. Damage to nerves, blood vessels, or healthy tissue inside the wound. Scarring. Loss of use of the body part. What happens before? Medicines Ask about changing or stopping: Any medicines you take. Any vitamins, herbs, or supplements you take. Do not take aspirin or ibuprofen unless you're told to. Procedure safety For your safety, you may: Need to wash your skin with a soap that kills germs. Get antibiotics. Have your procedure site marked. Have hair removed at the procedure site. General instructions Eat and drink only as you've been told. Ask if you'll be staying overnight in the hospital. If you'll be going home right after the procedure, plan to have a responsible adult: Drive you home from the hospital or clinic. You won't be allowed to drive. Stay with you for the time you're told. What happens  during surgical wound debridement?  An IV will be put into a vein in your hand or arm. You may be given: A sedative to help you relax. Anesthesia to keep you from feeling pain. Your wound will be cleaned with a germ-free (sterile) liquid. Sharp tools, a laser, or a high-pressured water stream will be used to remove dead and infected tissue. The wound will be cleaned again. A bandage will be put on your wound. These steps may vary. Ask what you can expect. What happens after? You'll be watched closely until you leave. This includes checking your pain level, blood pressure, heart rate, and breathing rate. You'll keep getting antibiotics if you were already getting them. You may also need a different type of debridement or special dressing. This can help with wound healing. This information is not intended to replace advice given to you by your health care provider. Make sure you discuss any questions you have with your health care provider. Document Revised: 11/05/2022 Document Reviewed: 11/05/2022 Elsevier Patient Education  2024 [employer].

## 2023-11-11 ENCOUNTER — Ambulatory Visit: Attending: Nurse Practitioner | Admitting: Nurse Practitioner

## 2023-11-11 ENCOUNTER — Encounter: Payer: Self-pay | Admitting: Nurse Practitioner

## 2023-11-11 DIAGNOSIS — G8929 Other chronic pain: Secondary | ICD-10-CM | POA: Diagnosis present

## 2023-11-11 DIAGNOSIS — Z79891 Long term (current) use of opiate analgesic: Secondary | ICD-10-CM | POA: Insufficient documentation

## 2023-11-11 DIAGNOSIS — M79671 Pain in right foot: Secondary | ICD-10-CM | POA: Diagnosis present

## 2023-11-11 DIAGNOSIS — M47816 Spondylosis without myelopathy or radiculopathy, lumbar region: Secondary | ICD-10-CM | POA: Insufficient documentation

## 2023-11-11 DIAGNOSIS — M79672 Pain in left foot: Secondary | ICD-10-CM | POA: Diagnosis present

## 2023-11-11 DIAGNOSIS — M545 Low back pain, unspecified: Secondary | ICD-10-CM | POA: Insufficient documentation

## 2023-11-11 DIAGNOSIS — Z79899 Other long term (current) drug therapy: Secondary | ICD-10-CM | POA: Diagnosis present

## 2023-11-11 DIAGNOSIS — M5137 Other intervertebral disc degeneration, lumbosacral region with discogenic back pain only: Secondary | ICD-10-CM | POA: Insufficient documentation

## 2023-11-11 DIAGNOSIS — G894 Chronic pain syndrome: Secondary | ICD-10-CM | POA: Insufficient documentation

## 2023-11-11 MED ORDER — HYDROCODONE-ACETAMINOPHEN 5-325 MG PO TABS: 1.0000 | ORAL_TABLET | Freq: Two times a day (BID) | ORAL | 0 refills | Status: DC | PRN

## 2023-11-11 MED ORDER — HYDROCODONE-ACETAMINOPHEN 5-325 MG PO TABS
1.0000 | ORAL_TABLET | Freq: Two times a day (BID) | ORAL | 0 refills | Status: DC | PRN
Start: 2023-11-24 — End: 2024-02-01

## 2023-11-11 NOTE — Progress Notes (Signed)
 PROVIDER NOTE: Interpretation of information contained herein should be left to medically-trained personnel. Specific patient instructions are provided elsewhere under Patient Instructions section of medical record. This document was created in part using AI and STT-dictation technology, any transcriptional errors that may result from this process are unintentional.  Patient: Darren Allen Newer  Service: E/M   PCP: Melvin Pao, NP  DOB: Dec 12, 1965  DOS: 11/11/2023  Provider: Emmy MARLA Blanch, NP  MRN: [id number]  Delivery: Face-to-face  Specialty: Interventional Pain Management  Type: Established Patient  Setting: Ambulatory outpatient facility  Specialty designation: 09  Referring Prov.: Melvin Pao, NP  Location: Outpatient office facility       History of present illness (HPI) [name], a 58 y.o. year old male, is here today because of his No primary diagnosis found.. Darren Allen primary complain today is Back Pain  Pertinent problems: Darren Allen does not have any pertinent problems on file.  Pain Assessment: Severity of Chronic pain is reported as a 4 /10. Location: Arm Right, Left/Denies. Onset: More than a month ago. Quality: Aching. Timing: Intermittent. Modifying factor(s): Procedure. Vitals:  height is 6' 2 (1.88 m) and weight is 241 lb (109.3 kg). His temporal temperature is 97.6 F (36.4 C). His blood pressure is 138/90 (abnormal) and his pulse is 96. His respiration is 20 and oxygen  saturation is 99%.  BMI: Estimated body mass index is 30.94 kg/m as calculated from the following:   Height as of this encounter: 6' 2 (1.88 m).   Weight as of this encounter: 241 lb (109.3 kg).  Last encounter: 08/11/2023. Last procedure: Visit date not found.  Reason for encounter: medication management. The patient reports doing well with current medication regimen. No adverse reaction or side effects reported to medication.  The patient reports admission to the ED for right  lower quadrant abdominal pain accompanied by hypertension.  His history is notable for hernia repair in January 2025, with persistent complication including chronic wound drainage, localized inflammation and associated tendinitis.  He underwent excision of the abdominal wall mesh with ventral hernia repair on 10/19/2023 by Dr. Jordis.  At present, the patient reports feeling well and denies any associated symptoms.  The patient also reports neuropathy-like symptoms (numbness and tingling) with bilateral hand and shoulder pain.  He has already completed x-ray and is currently awaiting the results.  Pharmacotherapy Assessment   Analgesic: Hydrocodone -acetaminophen  (Norco/Vicodin) 5-325 mg tablet 2 times daily as needed for pain. MME=10 Monitoring: [place] PMP: PDMP reviewed during this encounter.       Pharmacotherapy: No side-effects or adverse reactions reported. Compliance: No problems identified. Effectiveness: Clinically acceptable.  Darren Allen, [place]  11/11/2023 10:28 AM  Sign when Signing Visit Nursing Pain Medication Assessment:  Safety precautions to be maintained throughout the outpatient stay will include: orient to surroundings, keep bed in low position, maintain call bell within reach at all times, provide assistance with transfer out of bed and ambulation.  Medication Inspection Compliance: Pill count conducted under aseptic conditions, in front of the patient. Neither the pills nor the bottle was removed from the patient's sight at any time. Once count was completed pills were immediately returned to the patient in their original bottle.  Medication: Hydrocodone /APAP Pill/Patch Count: 35 of 60 pills/patches remain Pill/Patch Appearance: Markings consistent with prescribed medication Bottle Appearance: Standard pharmacy container. Clearly labeled. Filled Date: 08 / 11 / 2025 Last Medication intake:  Today  UDS:  Summary  Date Value Ref Range Status  08/11/2023 FINAL  Final     Comment:    ==================================================================== [facility] (MW) ==================================================================== Test                             Result       Flag       Units  Drug Present and Declared for Prescription Verification   Hydrocodone                     417          EXPECTED   ng/mg creat   Norhydrocodone                 434          EXPECTED   ng/mg creat    Sources of hydrocodone  include scheduled prescription medications.    Norhydrocodone is an expected metabolite of hydrocodone .  ==================================================================== Test                      Result    Flag   Units      Ref Range   Creatinine              89               mg/dL      >=79 ==================================================================== Declared Medications:  The flagging and interpretation on this report are based on the  following declared medications.  Unexpected results may arise from  inaccuracies in the declared medications.   **Note: The testing scope of this panel includes these medications:   Hydrocodone    **Note: The testing scope of this panel does not include the  following reported medications:   Acetaminophen   Albuterol  (Proair  HFA)  Albuterol  (Duoneb)  Amitriptyline  (Elavil )  Betamethasone  (Lotrisone )  Budesonide  (Breztri  Aerosphere)  Clotrimazole  (Lotrisone )  Dapagliflozin  (Farxiga )  Fluticasone  (Flonase )  Formoterol  (Breztri  Aerosphere)  Gabapentin  (Neurontin )  Glycopyrrolate  (Breztri  Aerosphere)  Hydrochlorothiazide  (Hydrodiuril )  Icosapent (Vascepa)  Insulin  (Humulin)  Ipratropium (Atrovent )  Ipratropium (Duoneb)  Iron  Lisinopril  (Zestril )  Magnesium  (Mag-Ox)  Metformin  (Glucophage )  Metronidazole  (MetroGel )  Montelukast  (Singulair )  Naloxone  (Narcan )  Pantoprazole  (Protonix )  Rosuvastatin  (Crestor )  Sulfamethoxazole  (Bactrim )  Terbinafine  (Lamisil )  Testosterone    Tirzepatide (Mounjaro)  Topical  Trimethoprim  (Bactrim )  Vitamin B12  Vitamin C ==================================================================== For clinical consultation, please call [phone]. ====================================================================     No results found for: CBDTHCR No results found for: D8THCCBX No results found for: D9THCCBX  ROS  Constitutional: Denies any fever or chills Gastrointestinal: No reported hemesis, hematochezia, vomiting, or acute GI distress Musculoskeletal: low back pain, bilateral shoulder pain, neck pain  Neurological: No reported episodes of acute onset apraxia, aphasia, dysarthria, agnosia, amnesia, paralysis, loss of coordination, or loss of consciousness  Medication Review  Clindamycin-Benzoyl Per (Refr), HYDROcodone -acetaminophen , Magnesium  Oxide -Mg Supplement, NEEDLE (DISP) 18 G, NEEDLE (DISP) 21 G, PRESCRIPTION MEDICATION, albuterol , amitriptyline , ascorbic acid, aspirin  EC, budesonide -glycopyrrolate -formoterol , cefPROZIL , cyanocobalamin , dapagliflozin  propanediol, ferrous sulfate , fluticasone , gabapentin , hydrochlorothiazide , icosapent Ethyl, insulin  isophane & regular human KwikPen, ipratropium, ipratropium-albuterol , lisinopril , lubiprostone , metFORMIN , montelukast , multivitamin, naloxone , pantoprazole , rosuvastatin , tazarotene , testosterone  cypionate, and tirzepatide  History Review  Allergy: Mr. Kinn is allergic to dupixent [dupilumab], glipizide, ciprofloxacin , cephalexin , and duloxetine. Drug: Mr. Kamau  reports current drug use. Drug: Hydrocodone . Alcohol:  reports that he does not currently use alcohol after a past usage of about 12.0 standard drinks of alcohol per week. Tobacco:  reports that he has quit smoking. His smoking  use included cigarettes. He has a 17 pack-year smoking history. He has been exposed to tobacco smoke. He uses smokeless tobacco. Social: Mr. Mcsweeney  reports that he has quit  smoking. His smoking use included cigarettes. He has a 17 pack-year smoking history. He has been exposed to tobacco smoke. He uses smokeless tobacco. He reports that he does not currently use alcohol after a past usage of about 12.0 standard drinks of alcohol per week. He reports current drug use. Drug: Hydrocodone . Medical:  has a past medical history of Allergy, Anxiety, Asthma, Chronic kidney disease, Chronic lower back pain, COPD (chronic obstructive pulmonary disease) (HCC), Coronary artery disease, DDD (degenerative disc disease), lumbosacral, Depression, Diabetic peripheral neuropathy (HCC), Diastolic dysfunction (12/27/2020), Diverticulosis, Emphysema of lung (HCC), Erectile dysfunction, GERD (gastroesophageal reflux disease), Hepatic steatosis, History of kidney stones, Hyperlipidemia, Hypertension, Hypogonadism male, IDA (iron deficiency anemia), Long term current use of opiate analgesic, OSA on CPAP, Osteoporosis, Paraesophageal hernia, PVD (peripheral vascular disease) (HCC), Sleep apnea, T2DM (type 2 diabetes mellitus) (HCC), Umbilical hernia, and Ventral hernia. Surgical: Mr. Frizell  has a past surgical history that includes Appendectomy; Incision and drainage perirectal abscess (N/A, 02/04/2015); Rectal exam under anesthesia (02/04/2015); ORIF ankle fracture (Left, 03/23/2017); Syndesmosis repair (Left, 03/23/2017); Colonoscopy with propofol  (N/A, 12/31/2021); Esophagogastroduodenoscopy (N/A, 12/31/2021); Kidney stone surgery (Right); lung mass removal (N/A); Xi robotic assisted paraesophageal hernia repair (N/A, 01/20/2022); Insertion of mesh (01/20/2022); Umbilical hernia repair (01/20/2022); Vasectomy; Circumcision; Bronchoscopy (2016); Muscle biopsy; Ventral hernia repair (N/A, 04/08/2023); Insertion of mesh (N/A, 04/08/2023); Hernia repair (07/2023); LEFT HEART CATH AND CORONARY ANGIOGRAPHY (Left, 08/16/2023); Abdominal hysterectomy; Ventral hernia repair (N/A, 10/13/2023); Excision of mesh  (N/A, 10/19/2023); and Ventral hernia repair (N/A, 10/19/2023). Family: family history includes Alcohol abuse in his father; Cancer in his brother, father, maternal grandfather, mother, and paternal aunt; Colon cancer (age of onset: 20) in his father; Diabetes in his brother; Drug abuse in his daughter; Esophageal cancer (age of onset: 47) in his brother; Heart disease in his brother and father; Kidney disease in his brother; Lung cancer in his maternal aunt; Lung cancer (age of onset: 36) in his mother.  Laboratory Chemistry Profile   Renal Lab Results  Component Value Date   BUN 12 11/02/2023   CREATININE 1.07 11/02/2023   LABCREA 46 10/18/2023   BCR 11 11/02/2023   GFRAA 59 (L) 12/07/2019   GFRNONAA 33 (L) 10/22/2023    Hepatic Lab Results  Component Value Date   AST 23 11/02/2023   ALT 30 11/02/2023   ALBUMIN  4.4 11/02/2023   ALKPHOS 55 11/02/2023   AMYLASE 93 02/04/2022   LIPASE 48 10/10/2023    Electrolytes Lab Results  Component Value Date   NA 140 11/02/2023   K 4.2 11/02/2023   CL 103 11/02/2023   CALCIUM  9.1 11/02/2023   MG 1.8 01/25/2022   PHOS 3.1 10/19/2023    Bone Lab Results  Component Value Date   25OHVITD1 44 09/08/2021   25OHVITD2 <1.0 09/08/2021   25OHVITD3 43 09/08/2021   TESTOSTERONE  349 09/13/2023    Inflammation (CRP: Acute Phase) (ESR: Chronic Phase) Lab Results  Component Value Date   CRP 1.8 (H) 09/08/2021   ESRSEDRATE 17 09/08/2021   LATICACIDVEN 1.9 10/22/2023         Note: Above Lab results reviewed.  Recent Imaging Review  CT ABDOMEN PELVIS W CONTRAST CLINICAL DATA:  Hypotension, right lower quadrant abdominal pain, status post hernia repair  EXAM: CT ABDOMEN AND PELVIS WITH CONTRAST  TECHNIQUE: Multidetector CT  imaging of the abdomen and pelvis was performed using the standard protocol following bolus administration of intravenous contrast.  RADIATION DOSE REDUCTION: This exam was performed according to the departmental  dose-optimization program which includes automated exposure control, adjustment of the mA and/or kV according to patient size and/or use of iterative reconstruction technique.  CONTRAST:  80mL OMNIPAQUE  IOHEXOL  300 MG/ML  SOLN  COMPARISON:  10/10/2023  FINDINGS: Lower chest: No acute abnormality.  Small hiatal hernia  Hepatobiliary: No focal liver abnormality is seen. No gallstones, gallbladder wall thickening, or biliary dilatation.  Pancreas: Unremarkable  Spleen: Unremarkable  Adrenals/Urinary Tract: The adrenal glands are unremarkable. The kidneys are normal in size and position. Stable nonobstructing 4 mm calculus within the left ureterovesicular junction. No hydronephrosis. No additional intrarenal or ureteral calculi. No perinephric inflammatory stranding or fluid collections are seen. The bladder is decompressed and is otherwise unremarkable.  Stomach/Bowel: Severe descending and sigmoid diverticulosis. Stomach, small bowel, and large bowel are otherwise unremarkable. Appendix absent. No evidence of obstruction or focal inflammation. No free intraperitoneal fluid.  The previously identified thick-walled collection of gas within the anterior peritoneum has nearly completely resolved with a tiny, punctate focus of residual intraperitoneal gas identified (50/2) and moderate residual inflammatory stranding within this region. No discrete drainable fluid collection. Punctate foci of gas are now seen within the subcutaneous fat of the anterior abdominal wall superficial to this region likely representing necessitation of previously noted intraperitoneal gas.  Vascular/Lymphatic: Aortic atherosclerosis. No enlarged abdominal or pelvic lymph nodes.  Reproductive: Prostate is unremarkable.  Other: There is progressive infiltration of the subcutaneous fat of the anterior abdominal wall in the region of the previously noted loculated gas collection which is nonspecific, but  can be seen the setting of local inflammation as can be seen with cellulitis. This is best seen on image # 39/2. No recurrent abdominal wall hernia.  Musculoskeletal: No acute or significant osseous findings.  IMPRESSION: 1. Near complete resolution of previously noted thick-walled collection of gas within the anterior peritoneum with moderate residual inflammatory stranding within this region. No discrete drainable fluid collection. 2. Progressive infiltration of the subcutaneous fat of the anterior abdominal wall in the region of the previously noted loculated gas collection which is nonspecific, but can be seen the setting of local inflammation as can be seen with cellulitis. Correlation with clinical examination is recommended. 3. Stable nonobstructing 4 mm calculus within the left ureterovesicular junction. No hydronephrosis. 4. Severe distal colonic diverticulosis without superimposed acute inflammatory change. 5. Aortic atherosclerosis.  Aortic Atherosclerosis (ICD10-I70.0).  Electronically Signed   By: Dorethia Molt M.D.   On: 10/22/2023 23:00 Note: Reviewed        Physical Exam  Vitals: BP (!) 138/90 (BP Location: Right Arm, Cuff Size: Normal)   Pulse 96   Temp 97.6 F (36.4 C) (Temporal)   Resp 20   Ht 6' 2 (1.88 m)   Wt 241 lb (109.3 kg)   SpO2 99%   BMI 30.94 kg/m  BMI: Estimated body mass index is 30.94 kg/m as calculated from the following:   Height as of this encounter: 6' 2 (1.88 m).   Weight as of this encounter: 241 lb (109.3 kg). Ideal: Ideal body weight: 82.2 kg (181 lb 3.5 oz) Adjusted ideal body weight: 93 kg (205 lb 2.1 oz) General appearance: Well nourished, well developed, and well hydrated. In no apparent acute distress Mental status: Alert, oriented x 3 (person, place, & time)       Respiratory:  No evidence of acute respiratory distress Eyes: PERLA   Assessment   Diagnosis Status  1. Chronic low back pain (1ry area of Pain)  (Bilateral) (R>L) w/o sciatica   2. Chronic feet pain (2ry area of Pain) (Bilateral)   3. Degeneration of intervertebral disc of lumbosacral region with discogenic back pain   4. Lumbar facet joint syndrome   5. Chronic pain syndrome   6. Pharmacologic therapy   7. Chronic use of opiate for therapeutic purpose   8. Encounter for medication management   9. Encounter for chronic pain management    Controlled Controlled Controlled   Updated Problems: No problems updated.  Plan of Care  Problem-specific:  Assessment and Plan  We will continue on current medication regimen.  Prescribing drug monitoring (PDMP) reviewed; findings consistent with use of prescribed medication and no evidence of narcotic misuse or abuse. UDS up to date. Schedule follow-up in 90 days for medication management.  Mr. ANIKETH HUBERTY has a current medication list which includes the following long-term medication(s): ferrous sulfate , fluticasone , hydrochlorothiazide , humulin 70/30 kwikpen, ipratropium, lisinopril , metformin , montelukast , rosuvastatin , testosterone  cypionate, vascepa, [START ON 11/24/2023] hydrocodone -acetaminophen , [START ON 12/24/2023] hydrocodone -acetaminophen , and [START ON 01/23/2024] hydrocodone -acetaminophen .  Pharmacotherapy (Medications Ordered): Meds ordered this encounter  Medications   HYDROcodone -acetaminophen  (NORCO/VICODIN) 5-325 MG tablet    Sig: Take 1 tablet by mouth 2 (two) times daily as needed. Must last 30 days.    Dispense:  60 tablet    Refill:  0    DO NOT: delete (not duplicate); no partial-fill (will deny script to complete), no refill request (F/U required). DISPENSE: 1 day early if closed on fill date. WARN: No CNS-depressants within 8 hrs of med.   HYDROcodone -acetaminophen  (NORCO/VICODIN) 5-325 MG tablet    Sig: Take 1 tablet by mouth 2 (two) times daily as needed. Must last 30 days.    Dispense:  60 tablet    Refill:  0    DO NOT: delete (not duplicate); no  partial-fill (will deny script to complete), no refill request (F/U required). DISPENSE: 1 day early if closed on fill date. WARN: No CNS-depressants within 8 hrs of med.   HYDROcodone -acetaminophen  (NORCO/VICODIN) 5-325 MG tablet    Sig: Take 1 tablet by mouth 2 (two) times daily as needed. Must last 30 days.    Dispense:  60 tablet    Refill:  0    DO NOT: delete (not duplicate); no partial-fill (will deny script to complete), no refill request (F/U required). DISPENSE: 1 day early if closed on fill date. WARN: No CNS-depressants within 8 hrs of med.   Orders:  No orders of the defined types were placed in this encounter.       Return in about 3 months (around 02/11/2024) for (F2F), (MM), Emmy Blanch NP.    Recent Visits Date Type Provider Dept  08/24/23 Office Visit Tanya Glisson, MD [facility]  Showing recent visits within past 90 days and meeting all other requirements Today's Visits Date Type Provider Dept  11/11/23 Office Visit Gola Bribiesca K, NP [facility]  Showing today's visits and meeting all other requirements Future Appointments Date Type Provider Dept  02/01/24 Appointment Beulah Capobianco K, NP [facility]  Showing future appointments within next 90 days and meeting all other requirements  I discussed the assessment and treatment plan with the patient. The patient was provided an opportunity to ask questions and all were answered. The patient agreed with the plan and demonstrated an understanding  of the instructions.  Patient advised to call back or seek an in-person evaluation if the symptoms or condition worsens.  Duration of encounter: 30 minutes.  Total time on encounter, as per AMA guidelines included both the face-to-face and non-face-to-face time personally spent by the physician and/or other qualified health care professional(s) on the day of the encounter (includes time in activities that require the physician or other  qualified health care professional and does not include time in activities normally performed by clinical staff). Physician's time may include the following activities when performed: Preparing to see the patient (e.g., pre-charting review of records, searching for previously ordered imaging, lab work, and nerve conduction tests) Review of prior analgesic pharmacotherapies. Reviewing PMP Interpreting ordered tests (e.g., lab work, imaging, nerve conduction tests) Performing post-procedure evaluations, including interpretation of diagnostic procedures Obtaining and/or reviewing separately obtained history Performing a medically appropriate examination and/or evaluation Counseling and educating the patient/family/caregiver Ordering medications, tests, or procedures Referring and communicating with other health care professionals (when not separately reported) Documenting clinical information in the electronic or other health record Independently interpreting results (not separately reported) and communicating results to the patient/ family/caregiver Care coordination (not separately reported)  Note by: Terissa Haffey K Michaelle Bottomley, NP (TTS and AI technology used. I apologize for any typographical errors that were not detected and corrected.) Date: 11/11/2023; Time: 10:58 AM

## 2023-11-11 NOTE — Progress Notes (Signed)
 Nursing Pain Medication Assessment:  Safety precautions to be maintained throughout the outpatient stay will include: orient to surroundings, keep bed in low position, maintain call bell within reach at all times, provide assistance with transfer out of bed and ambulation.  Medication Inspection Compliance: Pill count conducted under aseptic conditions, in front of the patient. Neither the pills nor the bottle was removed from the patient's sight at any time. Once count was completed pills were immediately returned to the patient in their original bottle.  Medication: Hydrocodone /APAP Pill/Patch Count: 35 of 60 pills/patches remain Pill/Patch Appearance: Markings consistent with prescribed medication Bottle Appearance: Standard pharmacy container. Clearly labeled. Filled Date: 08 / 11 / 2025 Last Medication intake:  Today

## 2023-11-12 ENCOUNTER — Telehealth: Payer: Self-pay

## 2023-11-12 NOTE — Telephone Encounter (Signed)
 Copied from CRM [id number]. Topic: Clinical - Home Health Verbal Orders >> Nov 12, 2023  4:04 PM Delon DASEN wrote: Caller/Agency: Carly with [facility] Number: [phone] Service Requested: home visit through insurance Frequency: n/a Any new concerns about the patient? Yes- heard a murmur on exam

## 2023-11-12 NOTE — Progress Notes (Addendum)
 Outpatient Surgical Follow Up    Darren Allen is an 58 y.o. male.   Chief Complaint  Patient presents with   Routine Post Op    Debridement of skin 10/13/23    HPI: Darren Allen is 3 weeks out from excision of mesh and repair of ventral hernia.  He feels much better.  No drainage from the wounds.  He is walking he has got more energy he is tolerating diet.  He is very Adult nurse  Past Medical History:  Diagnosis Date   Allergy    Anxiety    Asthma    Chronic kidney disease    Chronic lower back pain    a.) followed by pain management; on COT   COPD (chronic obstructive pulmonary disease) (HCC)    Coronary artery disease    a.) cCTA 06/30/2021: Ca2+ = 216 (84th %ile; 25049% pRCA and pLAD))   DDD (degenerative disc disease), lumbosacral    Depression    Diabetic peripheral neuropathy (HCC)    Diastolic dysfunction 12/27/2020   a.) TTE 12/27/2020: EF >55%, no RWMAs, G1DD, norm RVSF, triv MR/TR?PR   Diverticulosis    Emphysema of lung (HCC)    Erectile dysfunction    a.) on PDE5i (tadalafil )   GERD (gastroesophageal reflux disease)    Hepatic steatosis    History of kidney stones    Hyperlipidemia    Hypertension    Hypogonadism male    a.) on exogenous TRT (depotestosterone cypionate)   IDA (iron deficiency anemia)    Long term current use of opiate analgesic    a.) followed by pain management; naloxone  Rx available   OSA on CPAP    Osteoporosis    Paraesophageal hernia    a.) s/p robotic assisted repair 01/2022   PVD (peripheral vascular disease) (HCC)    Sleep apnea    T2DM (type 2 diabetes mellitus) (HCC)    Umbilical hernia    a.) s/p repair 01/20/2022   Ventral hernia     Past Surgical History:  Procedure Laterality Date   ABDOMINAL HYSTERECTOMY     APPENDECTOMY     BRONCHOSCOPY  2016   CIRCUMCISION     COLONOSCOPY WITH PROPOFOL  N/A 12/31/2021   Procedure: COLONOSCOPY WITH PROPOFOL ;  Surgeon: Darren Bi, MD;  Location: [facility] ENDOSCOPY;  Service:  Gastroenterology;  Laterality: N/A;   ESOPHAGOGASTRODUODENOSCOPY N/A 12/31/2021   Procedure: ESOPHAGOGASTRODUODENOSCOPY (EGD);  Surgeon: Darren Bi, MD;  Location: [facility] ENDOSCOPY;  Service: Gastroenterology;  Laterality: N/A;   EXCISION OF MESH N/A 10/19/2023   Procedure: REMOVAL, MESH, ABDOMEN OR PELVIS;  Surgeon: Darren Laneta FALCON, MD;  Location: [facility] ORS;  Service: General;  Laterality: N/A;   HERNIA REPAIR  07/2023   INCISION AND DRAINAGE PERIRECTAL ABSCESS N/A 02/04/2015   Procedure: IRRIGATION AND DEBRIDEMENT PERIRECTAL ABSCESS;  Surgeon: Darren Brands, MD;  Location: [facility] ORS;  Service: General;  Laterality: N/A;   INSERTION OF MESH  01/20/2022   Procedure: INSERTION OF MESH;  Surgeon: Darren Laneta FALCON, MD;  Location: [facility] ORS;  Service: General;;   INSERTION OF MESH N/A 04/08/2023   Procedure: INSERTION OF MESH;  Surgeon: Darren Laneta FALCON, MD;  Location: [facility] ORS;  Service: General;  Laterality: N/A;   KIDNEY STONE SURGERY Right    LEFT HEART CATH AND CORONARY ANGIOGRAPHY Left 08/16/2023   Procedure: LEFT HEART CATH AND CORONARY ANGIOGRAPHY;  Surgeon: Darren Cara BIRCH, MD;  Location: [facility] INVASIVE CV LAB;  Service: Cardiovascular;  Laterality: Left;   lung mass removal N/A  MUSCLE BIOPSY     ORIF ANKLE FRACTURE Left 03/23/2017   Procedure: OPEN REDUCTION INTERNAL FIXATION (ORIF) ANKLE FRACTURE;  Surgeon: Darren Priest, MD;  Location: [facility] ORS;  Service: Orthopedics;  Laterality: Left;   RECTAL EXAM UNDER ANESTHESIA  02/04/2015   Procedure: RECTAL EXAM UNDER ANESTHESIA;  Surgeon: Darren Brands, MD;  Location: [facility] ORS;  Service: General;;   SYNDESMOSIS REPAIR Left 03/23/2017   Procedure: SYNDESMOSIS REPAIR;  Surgeon: Darren Priest, MD;  Location: [facility] ORS;  Service: Orthopedics;  Laterality: Left;   UMBILICAL HERNIA REPAIR  01/20/2022   Procedure: HERNIA REPAIR UMBILICAL ADULT;  Surgeon: Darren Laneta FALCON, MD;  Location: [facility] ORS;  Service: General;;   VASECTOMY     VENTRAL  HERNIA REPAIR N/A 04/08/2023   Procedure: HERNIA REPAIR VENTRAL ADULT, open;  Surgeon: Darren Laneta FALCON, MD;  Location: [facility] ORS;  Service: General;  Laterality: N/A;   VENTRAL HERNIA REPAIR N/A 10/13/2023   Procedure: EXCISIONAL DEBRIDEMENT OF SKIN AND FASCIA;  Surgeon: Darren Laneta FALCON, MD;  Location: [facility] ORS;  Service: General;  Laterality: N/A;  open procedure w/mesh   VENTRAL HERNIA REPAIR N/A 10/19/2023   Procedure: REPAIR, HERNIA, VENTRAL;  Surgeon: Darren Laneta FALCON, MD;  Location: [facility] ORS;  Service: General;  Laterality: N/A;   XI ROBOTIC ASSISTED PARAESOPHAGEAL HERNIA REPAIR N/A 01/20/2022   Procedure: XI ROBOTIC ASSISTED PARAESOPHAGEAL HERNIA REPAIR, CONVERTED TO OPEN, RNFA to assist;  Surgeon: Darren Laneta FALCON, MD;  Location: [facility] ORS;  Service: General;  Laterality: N/A;    Family History  Problem Relation Age of Onset   Lung cancer Mother 49   Cancer Mother    Colon cancer Father 61   Heart disease Father    Alcohol abuse Father    Cancer Father    Esophageal cancer Brother 79   Diabetes Brother    Cancer Brother    Kidney disease Brother    Heart disease Brother    Lung cancer Maternal Aunt    Cancer Paternal Aunt        unk type   Cancer Maternal Grandfather        unk type   Drug abuse Daughter     Social History:  reports that he has quit smoking. His smoking use included cigarettes. He has a 17 pack-year smoking history. He has been exposed to tobacco smoke. He uses smokeless tobacco. He reports that he does not currently use alcohol after a past usage of about 12.0 standard drinks of alcohol per week. He reports current drug use. Drug: Hydrocodone .  Allergies:  Allergies  Allergen Reactions   Dupixent [Dupilumab] Rash   Glipizide Palpitations and Other (See Comments)    Shaky, feel bad Other reaction(s): Dizziness   Ciprofloxacin  Itching    Caused itching and burning    Cephalexin  Rash    Received IV rocephin , zosyn  w/o issues   Duloxetine Anxiety and Nausea Only     Medications reviewed.    ROS Full ROS performed and is otherwise negative other than what is stated in HPI   BP (!) 147/79   Pulse 99   Temp 98.6 F (37 C) (Oral)   Ht 6' 2 (1.88 m)   Wt 241 lb 3.2 oz (109.4 kg)   SpO2 97%   BMI 30.97 kg/m   Physical Exam   NAd alert Abd: soft, no infection, wounds closed, no hernia recurrence   Assessment/Plan: Very well after tissue repair of hernia with excision of mesh.  No evidence of complications.  Continue lifting restrictions.  Follow-up as needed I personally spent a total of 20 minutes in the care of the patient today including performing a medically appropriate exam/evaluation, counseling and educating, placing orders, referring and communicating with other health care professionals, documenting clinical information in the EHR, independently interpreting and reviewing images studies and coordinating care.   Laneta Luna, MD [facility] General Surgeon

## 2023-11-16 NOTE — Telephone Encounter (Signed)
 FYI to provider

## 2023-11-17 NOTE — Telephone Encounter (Signed)
 Noted.  Patient is followed by Cardiology.  I recommend he make an appointment with them for evaluation.

## 2023-11-17 NOTE — Telephone Encounter (Signed)
 Patient has cardiology appointment 12/01/23.

## 2023-11-18 ENCOUNTER — Encounter: Payer: Self-pay | Admitting: Nurse Practitioner

## 2023-11-18 MED ORDER — PANTOPRAZOLE SODIUM 40 MG PO TBEC: 40.0000 mg | DELAYED_RELEASE_TABLET | Freq: Two times a day (BID) | ORAL | 1 refills | Status: AC

## 2023-11-19 ENCOUNTER — Ambulatory Visit
Admission: RE | Admit: 2023-11-19 | Discharge: 2023-11-19 | Disposition: A | Discharge status: Home / Self Care | Source: Ambulatory Visit | Attending: Emergency Medicine | Admitting: Emergency Medicine

## 2023-11-19 DIAGNOSIS — R911 Solitary pulmonary nodule: Secondary | ICD-10-CM | POA: Diagnosis present

## 2023-12-06 ENCOUNTER — Encounter: Payer: Self-pay | Admitting: Nurse Practitioner

## 2023-12-07 NOTE — Progress Notes (Unsigned)
 PROVIDER NOTE: Interpretation of information contained herein should be left to medically-trained personnel. Specific patient instructions are provided elsewhere under Patient Instructions section of medical record. This document was created in part using AI and STT-dictation technology, any transcriptional errors that may result from this process are unintentional.  Patient: Darren Allen  Service: E/M   PCP: Melvin Pao, NP  DOB: [birth year]  DOS: 12/08/2023  Provider: Eric DELENA Como, MD  MRN: [id number]  Delivery: Face-to-face  Specialty: Interventional Pain Management  Type: Established Patient  Setting: Ambulatory outpatient facility  Specialty designation: 09  Referring Prov.: Melvin Pao, NP  Location: Outpatient office facility       History of present illness (HPI) Darren Allen, a 58 y.o. year old male, is here today because of his No primary diagnosis found.. Mr. Worley primary complain today is No chief complaint on file.  Pertinent problems: Mr. Ohern has Ankle fracture; Neuropathy; Diabetic peripheral neuropathy (HCC); Gout; Chronic ankle pain (Bilateral); Chronic low back pain (1ry area of Pain) (Bilateral) (R>L) w/o sciatica; Other acquired hammer toe; Chronic pain syndrome; Lumbar facet syndrome; Lumbosacral radiculopathy at S1 (Left); Chronic feet pain (2ry area of Pain) (Bilateral); Chronic ankle pain (Left); Abnormal NCS (nerve conduction studies) (02/20/2020); Abnormal MRI, lumbar spine (03/11/2020); Lumbosacral lateral recess stenosis (Left: L5-S1); Chronic low back pain (Bilateral) w/ sciatica (Left); Abnormal MRI, thoracic spine (07/05/2020); Chronic low back pain (Bilateral) w/ sciatica (Right); Chronic lower extremity pain (intermittent) (Right); Chronic lower extremity pain (intermittent) (Left); Lumbar radiculitis (Right); Lumbar radiculitis (Left); Lumbar facet joint pain; Spondylosis without myelopathy or radiculopathy, lumbosacral region; DDD  (degenerative disc disease), lumbosacral w/ LBP; Low back pain of over 3 months duration; Mechanical low back pain; Recurrent low back pain; and Trigger point with back pain (Right) on their pertinent problem list.  Pain Assessment: Severity of   is reported as a  /10. Location:    / . Onset:  . Quality:  . Timing:  . Modifying factor(s):  SABRA Vitals:  vitals were not taken for this visit.  BMI: Estimated body mass index is 30.94 kg/m as calculated from the following:   Height as of 11/11/23: 6' 2 (1.88 m).   Weight as of 11/11/23: 241 lb (109.3 kg).  Last encounter: 08/24/2023. Last procedure: 07/13/2023.  Reason for encounter: evaluation of worsening, or previously known (established) problem.   Discussed the use of AI scribe software for clinical note transcription with the patient, who gave verbal consent to proceed.  History of Present Illness           Pharmacotherapy Assessment   Analgesic: Hydrocodone /APAP 5/325 tablet, 1 tab p.o. twice daily (#60) MME/day: 10 mg/day   Monitoring: [place] PMP: PDMP reviewed during this encounter.       Pharmacotherapy: No side-effects or adverse reactions reported. Compliance: No problems identified. Effectiveness: Clinically acceptable.  No notes on file  UDS:  Summary  Date Value Ref Range Status  08/11/2023 FINAL  Final    Comment:    ==================================================================== [employer] [facility] 13 (MW) ==================================================================== Test                             Result       Flag       Units  Drug Present and Declared for Prescription Verification   Hydrocodone                     417  EXPECTED   ng/mg creat   Norhydrocodone                 434          EXPECTED   ng/mg creat    Sources of hydrocodone  include scheduled prescription medications.    Norhydrocodone is an expected metabolite of  hydrocodone .  ==================================================================== Test                      Result    Flag   Units      Ref Range   Creatinine              89               mg/dL      >=79 ==================================================================== Declared Medications:  The flagging and interpretation on this report are based on the  following declared medications.  Unexpected results may arise from  inaccuracies in the declared medications.   **Note: The testing scope of this panel includes these medications:   Hydrocodone    **Note: The testing scope of this panel does not include the  following reported medications:   Acetaminophen   Albuterol  (Proair  HFA)  Albuterol  (Duoneb)  Amitriptyline  (Elavil )  Betamethasone  (Lotrisone )  Budesonide  (Breztri  Aerosphere)  Clotrimazole  (Lotrisone )  Dapagliflozin  (Farxiga )  Fluticasone  (Flonase )  Formoterol  (Breztri  Aerosphere)  Gabapentin  (Neurontin )  Glycopyrrolate  (Breztri  Aerosphere)  Hydrochlorothiazide  (Hydrodiuril )  Icosapent (Vascepa)  Insulin  (Humulin)  Ipratropium (Atrovent )  Ipratropium (Duoneb)  Iron  Lisinopril  (Zestril )  Magnesium  (Mag-Ox)  Metformin  (Glucophage )  Metronidazole  (MetroGel )  Montelukast  (Singulair )  Naloxone  (Narcan )  Pantoprazole  (Protonix )  Rosuvastatin  (Crestor )  Sulfamethoxazole  (Bactrim )  Terbinafine  (Lamisil )  Testosterone   Tirzepatide (Mounjaro)  Topical  Trimethoprim  (Bactrim )  Vitamin B12  Vitamin C ==================================================================== For clinical consultation, please call [phone]. ====================================================================     No results found for: CBDTHCR No results found for: D8THCCBX No results found for: D9THCCBX  ROS  Constitutional: Denies any fever or chills Gastrointestinal: No reported hemesis, hematochezia, vomiting, or acute GI distress Musculoskeletal: Denies any acute  onset joint swelling, redness, loss of ROM, or weakness Neurological: No reported episodes of acute onset apraxia, aphasia, dysarthria, agnosia, amnesia, paralysis, loss of coordination, or loss of consciousness  Medication Review  Clindamycin-Benzoyl Per (Refr), HYDROcodone -acetaminophen , Magnesium  Oxide -Mg Supplement, NEEDLE (DISP) 18 G, NEEDLE (DISP) 21 G, PRESCRIPTION MEDICATION, albuterol , amitriptyline , ascorbic acid, aspirin  EC, budesonide -glycopyrrolate -formoterol , cefPROZIL , cyanocobalamin , dapagliflozin  propanediol, ferrous sulfate , fluticasone , gabapentin , hydrochlorothiazide , icosapent Ethyl, insulin  isophane & regular human KwikPen, ipratropium, ipratropium-albuterol , lisinopril , lubiprostone , metFORMIN , montelukast , multivitamin, naloxone , pantoprazole , rosuvastatin , tazarotene , testosterone  cypionate, and tirzepatide  History Review  Allergy: Mr. Spahr is allergic to dupixent [dupilumab], glipizide, ciprofloxacin , cephalexin , and duloxetine. Drug: Mr. Rumbold  reports current drug use. Drug: Hydrocodone . Alcohol:  reports that he does not currently use alcohol after a past usage of about 12.0 standard drinks of alcohol per week. Tobacco:  reports that he has quit smoking. His smoking use included cigarettes. He has a 17 pack-year smoking history. He has been exposed to tobacco smoke. He uses smokeless tobacco. Social: Mr. Hardcastle  reports that he has quit smoking. His smoking use included cigarettes. He has a 17 pack-year smoking history. He has been exposed to tobacco smoke. He uses smokeless tobacco. He reports that he does not currently use alcohol after a past usage of about 12.0 standard drinks of alcohol per week. He reports current drug use. Drug: Hydrocodone . Medical:  has a past medical history of Allergy, Anxiety, Asthma, Chronic  kidney disease, Chronic lower back pain, COPD (chronic obstructive pulmonary disease) (HCC), Coronary artery disease, DDD (degenerative disc disease),  lumbosacral, Depression, Diabetic peripheral neuropathy (HCC), Diastolic dysfunction (12/27/2020), Diverticulosis, Emphysema of lung (HCC), Erectile dysfunction, GERD (gastroesophageal reflux disease), Hepatic steatosis, History of kidney stones, Hyperlipidemia, Hypertension, Hypogonadism male, IDA (iron deficiency anemia), Long term current use of opiate analgesic, OSA on CPAP, Osteoporosis, Paraesophageal hernia, PVD (peripheral vascular disease), Sleep apnea, T2DM (type 2 diabetes mellitus) (HCC), Umbilical hernia, and Ventral hernia. Surgical: Mr. Hutchinson  has a past surgical history that includes Appendectomy; Incision and drainage perirectal abscess (N/A, 02/04/2015); Rectal exam under anesthesia (02/04/2015); ORIF ankle fracture (Left, 03/23/2017); Syndesmosis repair (Left, 03/23/2017); Colonoscopy with propofol  (N/A, 12/31/2021); Esophagogastroduodenoscopy (N/A, 12/31/2021); Kidney stone surgery (Right); lung mass removal (N/A); Xi robotic assisted paraesophageal hernia repair (N/A, 01/20/2022); Insertion of mesh (01/20/2022); Umbilical hernia repair (01/20/2022); Vasectomy; Circumcision; Bronchoscopy (2016); Muscle biopsy; Ventral hernia repair (N/A, 04/08/2023); Insertion of mesh (N/A, 04/08/2023); Hernia repair (07/2023); LEFT HEART CATH AND CORONARY ANGIOGRAPHY (Left, 08/16/2023); Abdominal hysterectomy; Ventral hernia repair (N/A, 10/13/2023); Excision of mesh (N/A, 10/19/2023); and Ventral hernia repair (N/A, 10/19/2023). Family: family history includes Alcohol abuse in his father; Cancer in his brother, father, maternal grandfather, mother, and paternal aunt; Colon cancer (age of onset: 22) in his father; Diabetes in his brother; Drug abuse in his daughter; Esophageal cancer (age of onset: 59) in his brother; Heart disease in his brother and father; Kidney disease in his brother; Lung cancer in his maternal aunt; Lung cancer (age of onset: 73) in his mother.  Laboratory Chemistry Profile   Renal Lab  Results  Component Value Date   BUN 12 11/02/2023   CREATININE 1.07 11/02/2023   LABCREA 46 10/18/2023   BCR 11 11/02/2023   GFRAA 59 (L) 12/07/2019   GFRNONAA 33 (L) 10/22/2023    Hepatic Lab Results  Component Value Date   AST 23 11/02/2023   ALT 30 11/02/2023   ALBUMIN  4.4 11/02/2023   ALKPHOS 55 11/02/2023   AMYLASE 93 02/04/2022   LIPASE 48 10/10/2023    Electrolytes Lab Results  Component Value Date   NA 140 11/02/2023   K 4.2 11/02/2023   CL 103 11/02/2023   CALCIUM  9.1 11/02/2023   MG 1.8 01/25/2022   PHOS 3.1 10/19/2023    Bone Lab Results  Component Value Date   25OHVITD1 44 09/08/2021   25OHVITD2 <1.0 09/08/2021   25OHVITD3 43 09/08/2021   TESTOSTERONE  349 09/13/2023    Inflammation (CRP: Acute Phase) (ESR: Chronic Phase) Lab Results  Component Value Date   CRP 1.8 (H) 09/08/2021   ESRSEDRATE 17 09/08/2021   LATICACIDVEN 1.9 10/22/2023         Note: Above Lab results reviewed.  Recent Imaging Review  CT CHEST LCS NODULE F/U LOW DOSE WO CONTRAST CLINICAL DATA:  58 year old male former smoker with 40 pack-year smoking history. Follow-up Lung-RADS 3.  EXAM: CT CHEST WITHOUT CONTRAST FOR LUNG CANCER SCREENING NODULE FOLLOW-UP  TECHNIQUE: Multidetector CT imaging of the chest was performed following the standard protocol without IV contrast.  RADIATION DOSE REDUCTION: This exam was performed according to the departmental dose-optimization program which includes automated exposure control, adjustment of the mA and/or kV according to patient size and/or use of iterative reconstruction technique.  COMPARISON:  CT cardiac dated 07/26/2023, CT chest dated 05/18/2023  FINDINGS: Cardiovascular: Normal heart size. No significant pericardial fluid/thickening. Great vessels are normal in course and caliber. Coronary artery calcifications and aortic atherosclerosis.  Mediastinum/Nodes: Imaged  thyroid gland without nodules meeting criteria for imaging  follow-up by size. Normal esophagus. No pathologically enlarged axillary, supraclavicular, mediastinal, or hilar lymph nodes.  Lungs/Pleura: The central airways are patent. Mild biapical pleural-parenchymal scarring. Mixture of centrilobular and paraseptal emphysema and subpleural reticulations with honeycombing in the right-greater-than-left lungs. Pulmonary nodules measuring up to 6.7 cm peribronchovascular right upper lobe (4:116) are unchanged.) No pneumothorax. No pleural effusion.  Upper abdomen: Surgical clips at the gastroesophageal junction, related to presumed fundoplication.  Musculoskeletal: No acute or abnormal lytic or blastic osseous lesions. Multilevel degenerative changes of the thoracic spine.  IMPRESSION: 1. Lung-RADS 2, benign appearance or behavior. Continue annual screening with low-dose chest CT without contrast in 12 months. 2. Aortic Atherosclerosis (ICD10-I70.0) and Emphysema (ICD10-J43.9). Coronary artery calcifications. Assessment for potential risk factor modification, dietary therapy or pharmacologic therapy may be warranted, if clinically indicated.  Electronically Signed   By: Limin  Xu M.D.   On: 12/03/2023 08:53 Note: Reviewed        Physical Exam  Vitals: There were no vitals taken for this visit. BMI: Estimated body mass index is 30.94 kg/m as calculated from the following:   Height as of 11/11/23: 6' 2 (1.88 m).   Weight as of 11/11/23: 241 lb (109.3 kg). Ideal: Patient weight not recorded General appearance: Well nourished, well developed, and well hydrated. In no apparent acute distress Mental status: Alert, oriented x 3 (person, place, & time)       Respiratory: No evidence of acute respiratory distress Eyes: PERLA   Assessment   Diagnosis Status  No diagnosis found. Controlled Controlled Controlled   Updated Problems: No problems updated.  Plan of Care  Problem-specific:  Assessment and Plan            Mr. ATZEL MCCAMBRIDGE has a current medication list which includes the following long-term medication(s): ferrous sulfate , fluticasone , hydrochlorothiazide , hydrocodone -acetaminophen , [START ON 12/24/2023] hydrocodone -acetaminophen , [START ON 01/23/2024] hydrocodone -acetaminophen , humulin 70/30 kwikpen, ipratropium, lisinopril , metformin , montelukast , pantoprazole , rosuvastatin , testosterone  cypionate, and vascepa.  Pharmacotherapy (Medications Ordered): No orders of the defined types were placed in this encounter.  Orders:  No orders of the defined types were placed in this encounter.    Interventional Therapies  Risk Factors  Considerations:   WNL   Planned  Pending:   Therapeutic right lumbar facet RFA #1 (07/13/2023)    Under consideration:   Therapeutic bilateral lumbar facet RFA #1 (starting with the left side) Therapeutic right L5-S1 LESI #2  Possible spinal cord stimulator trial  Therapeutic left L5-S1 percutaneous discectomy with [employer] Dekompressor system    Completed:   Therapeutic left lumbar facet RFA x1 (06/22/2023) (100/100/75/75) (w/ steroids)  Diagnostic bilateral lumbar facet MBB x1 (03/02/2023) (100/100/95/100) (w/ steroids)  Diagnostic bilateral lumbar facet MBB x1 (03/02/2023) (80-100/80-100/0/0) (w/o steroids)  Diagnostic midline to left caudal ESI x1 (04/02/2022) (100/100/100/LBP:100/LEP:100)  Therapeutic left L5-S1 LESI x3 (08/27/2022) (1st:100/100/100/LBP:85  LEP:100) (2nd: 01/13/59/LBP:60LLEP:100)  Therapeutic right L5-S1 LESI x1 (12/24/2022) (100/85/100/LBP:100)  Therapeutic bilateral Qutenza  neurolytic treatment x1 (12/23/2021)  (09/08/2021 & 10/29/2021) referral to physical therapy for evaluation and treatment of low back pain. Physical therapy for the lower back ordered.  (06/07/2023-referral entered)    Completed by other providers:   EMG/PNCV of lower extremity (02/20/2020) by Dr. Jannett Fairly (generalized sensorimotor peripheral neuropathy; superimposed left S1  radiculopathy)   Therapeutic  Palliative (PRN) options:   Diagnostic bilateral lumbar facet MBB #2 (without steroids) (PRN)     No follow-ups on file.    Recent Visits  Date Type Provider Dept  11/11/23 Office Visit Patel, Seema K, NP [facility]  Showing recent visits within past 90 days and meeting all other requirements Future Appointments Date Type Provider Dept  12/08/23 Appointment Tanya Glisson, MD [facility]  02/01/24 Appointment Patel, Seema K, NP [facility]  Showing future appointments within next 90 days and meeting all other requirements  I discussed the assessment and treatment plan with the patient. The patient was provided an opportunity to ask questions and all were answered. The patient agreed with the plan and demonstrated an understanding of the instructions.  Patient advised to call back or seek an in-person evaluation if the symptoms or condition worsens.  Duration of encounter: *** minutes.  Total time on encounter, as per AMA guidelines included both the face-to-face and non-face-to-face time personally spent by the physician and/or other qualified health care professional(s) on the day of the encounter (includes time in activities that require the physician or other qualified health care professional and does not include time in activities normally performed by clinical staff). Physician's time may include the following activities when performed: Preparing to see the patient (e.g., pre-charting review of records, searching for previously ordered imaging, lab work, and nerve conduction tests) Review of prior analgesic pharmacotherapies. Reviewing PMP Interpreting ordered tests (e.g., lab work, imaging, nerve conduction tests) Performing post-procedure evaluations, including interpretation of diagnostic procedures Obtaining and/or reviewing separately obtained history Performing a medically appropriate examination and/or  evaluation Counseling and educating the patient/family/caregiver Ordering medications, tests, or procedures Referring and communicating with other health care professionals (when not separately reported) Documenting clinical information in the electronic or other health record Independently interpreting results (not separately reported) and communicating results to the patient/ family/caregiver Care coordination (not separately reported)  Note by: Glisson DELENA Tanya, MD (TTS and AI technology used. I apologize for any typographical errors that were not detected and corrected.) Date: 12/08/2023; Time: 11:25 AM

## 2023-12-08 ENCOUNTER — Encounter: Payer: Self-pay | Admitting: Pain Medicine

## 2023-12-08 ENCOUNTER — Ambulatory Visit: Attending: Pain Medicine | Admitting: Pain Medicine

## 2023-12-08 VITALS — BP 132/84 | HR 97 | Temp 99.7°F | Ht 74.0 in | Wt 242.0 lb

## 2023-12-08 DIAGNOSIS — Z79899 Other long term (current) drug therapy: Secondary | ICD-10-CM | POA: Diagnosis present

## 2023-12-08 DIAGNOSIS — M899 Disorder of bone, unspecified: Secondary | ICD-10-CM | POA: Insufficient documentation

## 2023-12-08 DIAGNOSIS — I700 Atherosclerosis of aorta: Secondary | ICD-10-CM | POA: Insufficient documentation

## 2023-12-08 DIAGNOSIS — G894 Chronic pain syndrome: Secondary | ICD-10-CM | POA: Insufficient documentation

## 2023-12-08 DIAGNOSIS — Z789 Other specified health status: Secondary | ICD-10-CM | POA: Insufficient documentation

## 2023-12-08 DIAGNOSIS — M79622 Pain in left upper arm: Secondary | ICD-10-CM | POA: Insufficient documentation

## 2023-12-08 DIAGNOSIS — M79621 Pain in right upper arm: Secondary | ICD-10-CM | POA: Diagnosis not present

## 2023-12-08 DIAGNOSIS — M25511 Pain in right shoulder: Secondary | ICD-10-CM | POA: Insufficient documentation

## 2023-12-08 DIAGNOSIS — M25512 Pain in left shoulder: Secondary | ICD-10-CM | POA: Insufficient documentation

## 2023-12-08 DIAGNOSIS — G8929 Other chronic pain: Secondary | ICD-10-CM | POA: Insufficient documentation

## 2023-12-08 NOTE — Progress Notes (Signed)
 Safety precautions to be maintained throughout the outpatient stay will include: orient to surroundings, keep bed in low position, maintain call bell within reach at all times, provide assistance with transfer out of bed and ambulation.

## 2023-12-13 ENCOUNTER — Ambulatory Visit
Admission: RE | Admit: 2023-12-13 | Discharge: 2023-12-13 | Disposition: A | Discharge status: Home / Self Care | Source: Ambulatory Visit | Attending: Pain Medicine | Admitting: Pain Medicine

## 2023-12-13 DIAGNOSIS — M899 Disorder of bone, unspecified: Secondary | ICD-10-CM | POA: Diagnosis present

## 2023-12-13 DIAGNOSIS — M79621 Pain in right upper arm: Secondary | ICD-10-CM | POA: Diagnosis present

## 2023-12-13 DIAGNOSIS — M25512 Pain in left shoulder: Secondary | ICD-10-CM | POA: Diagnosis present

## 2023-12-13 DIAGNOSIS — M25511 Pain in right shoulder: Secondary | ICD-10-CM | POA: Insufficient documentation

## 2023-12-13 DIAGNOSIS — M79622 Pain in left upper arm: Secondary | ICD-10-CM | POA: Insufficient documentation

## 2023-12-13 DIAGNOSIS — G8929 Other chronic pain: Secondary | ICD-10-CM | POA: Insufficient documentation

## 2023-12-13 DIAGNOSIS — G894 Chronic pain syndrome: Secondary | ICD-10-CM | POA: Diagnosis present

## 2023-12-16 LAB — COMP. METABOLIC PANEL (12)
AST: 29 IU/L (ref 0–40)
Albumin: 4.7 g/dL (ref 3.8–4.9)
Alkaline Phosphatase: 57 IU/L (ref 47–123)
BUN/Creatinine Ratio: 13 (ref 10–24)
BUN: 16 mg/dL (ref 8–27)
Bilirubin Total: 0.5 mg/dL (ref 0.0–1.2)
Calcium: 9.8 mg/dL (ref 8.6–10.2)
Chloride: 103 mmol/L (ref 96–106)
Creatinine, Ser: 1.23 mg/dL (ref 0.76–1.27)
Globulin, Total: 2.6 g/dL (ref 1.5–4.5)
Glucose: 111 mg/dL — ABNORMAL HIGH (ref 70–99)
Potassium: 4.4 mmol/L (ref 3.5–5.2)
Sodium: 142 mmol/L (ref 134–144)
Total Protein: 7.3 g/dL (ref 6.0–8.5)
eGFR: 67 mL/min/1.73 (ref >59)

## 2023-12-16 LAB — C-REACTIVE PROTEIN: CRP: 3 mg/L (ref 0–10)

## 2023-12-16 LAB — 25-HYDROXY VITAMIN D LCMS D2+D3
25-Hydroxy, Vitamin D-2: <1 ng/mL
25-Hydroxy, Vitamin D-3: 45 ng/mL
25-Hydroxy, Vitamin D: 45 ng/mL

## 2023-12-16 LAB — VITAMIN B12: Vitamin B-12: 867 pg/mL (ref 232–1245)

## 2023-12-16 LAB — MAGNESIUM: Magnesium: 1.9 mg/dL (ref 1.6–2.3)

## 2023-12-16 LAB — SEDIMENTATION RATE: Sed Rate: 18 mm/h (ref 0–30)

## 2023-12-21 ENCOUNTER — Telehealth: Payer: Self-pay | Admitting: Pain Medicine

## 2023-12-21 NOTE — Telephone Encounter (Signed)
 PATIENT CALLED STATING THAT HIS ULTRASOUND DONE AND WOULD LIKE TO KNOW WHAT IS NEXT. PLEASE GIVE PATIENT A CALL.

## 2023-12-21 NOTE — Telephone Encounter (Signed)
 Dr. Naveira did not enter return orders at last visit. Pt will need a follow-up appt to discuss ultrasound results.

## 2023-12-27 NOTE — Telephone Encounter (Signed)
 Patient scheduled for 01/05/2024

## 2024-01-03 NOTE — Progress Notes (Unsigned)
 PROVIDER NOTE: Interpretation of information contained herein should be left to medically-trained personnel. Specific patient instructions are provided elsewhere under Patient Instructions section of medical record. This document was created in part using AI and STT-dictation technology, any transcriptional errors that may result from this process are unintentional.  Patient: Darren Allen  Service: E/M   PCP: Melvin Pao, NP  DOB: March 04, 1966  DOS: 01/05/2024  Provider: Eric DELENA Como, MD  MRN: [id number]  Delivery: Face-to-face  Specialty: Interventional Pain Management  Type: Established Patient  Setting: Ambulatory outpatient facility  Specialty designation: 09  Referring Prov.: Melvin Pao, NP  Location: Outpatient office facility       History of present illness (HPI) Darren Allen, a 58 y.o. year old male, is here today because of his No primary diagnosis found.. Darren Allen primary complain today is No chief complaint on file.  Pertinent problems: Darren Allen has Ankle fracture; Neuropathy; Diabetic peripheral neuropathy (HCC); Gout; Chronic ankle pain (Bilateral); Chronic low back pain (1ry area of Pain) (Bilateral) (R>L) w/o sciatica; Other acquired hammer toe; Chronic pain syndrome; Lumbar facet syndrome; Lumbosacral radiculopathy at S1 (Left); Chronic feet pain (2ry area of Pain) (Bilateral); Chronic ankle pain (Left); Abnormal NCS (nerve conduction studies) (02/20/2020); Abnormal MRI, lumbar spine (03/11/2020); Lumbosacral lateral recess stenosis (Left: L5-S1); Chronic low back pain (Bilateral) w/ sciatica (Left); Abnormal MRI, thoracic spine (07/05/2020); Chronic low back pain (Bilateral) w/ sciatica (Right); Chronic lower extremity pain (intermittent) (Right); Chronic lower extremity pain (intermittent) (Left); Lumbar radiculitis (Right); Lumbar radiculitis (Left); Lumbar facet joint pain; Spondylosis without myelopathy or radiculopathy, lumbosacral region; DDD  (degenerative disc disease), lumbosacral w/ LBP; Low back pain of over 3 months duration; Mechanical low back pain; Recurrent low back pain; Trigger point with back pain (Right); and Chronic pain of shoulders (Bilateral) on their pertinent problem list.  Pain Assessment: Severity of   is reported as a  /10. Location:    / . Onset:  . Quality:  . Timing:  . Modifying factor(s):  SABRA Vitals:  vitals were not taken for this visit.  BMI: Estimated body mass index is 31.07 kg/m as calculated from the following:   Height as of 12/08/23: 6' 2 (1.88 m).   Weight as of 12/08/23: 242 lb (109.8 kg).  Last encounter: 12/08/2023. Last procedure: 07/13/2023.  Reason for encounter: follow-up evaluation.   Discussed the use of AI scribe software for clinical note transcription with the patient, who gave verbal consent to proceed.  History of Present Illness           Pharmacotherapy Assessment   Analgesic: Hydrocodone /APAP 5/325 tablet, 1 tab p.o. twice daily (#60) MME/day: 10 mg/day   Monitoring: [place] PMP: PDMP reviewed during this encounter.       Pharmacotherapy: No side-effects or adverse reactions reported. Compliance: No problems identified. Effectiveness: Clinically acceptable.  No notes on file  UDS:  Summary  Date Value Ref Range Status  08/11/2023 FINAL  Final    Comment:    ==================================================================== [employer] [facility] (MW) ==================================================================== Test                             Result       Flag       Units  Drug Present and Declared for Prescription Verification   Hydrocodone                     417  EXPECTED   ng/mg creat   Norhydrocodone                 434          EXPECTED   ng/mg creat    Sources of hydrocodone  include scheduled prescription medications.    Norhydrocodone is an expected metabolite of  hydrocodone .  ==================================================================== Test                      Result    Flag   Units      Ref Range   Creatinine              89               mg/dL      >=79 ==================================================================== Declared Medications:  The flagging and interpretation on this report are based on the  following declared medications.  Unexpected results may arise from  inaccuracies in the declared medications.   **Note: The testing scope of this panel includes these medications:   Hydrocodone    **Note: The testing scope of this panel does not include the  following reported medications:   Acetaminophen   Albuterol  (Proair  HFA)  Albuterol  (Duoneb)  Amitriptyline  (Elavil )  Betamethasone  (Lotrisone )  Budesonide  (Breztri  Aerosphere)  Clotrimazole  (Lotrisone )  Dapagliflozin  (Farxiga )  Fluticasone  (Flonase )  Formoterol  (Breztri  Aerosphere)  Gabapentin  (Neurontin )  Glycopyrrolate  (Breztri  Aerosphere)  Hydrochlorothiazide  (Hydrodiuril )  Icosapent (Vascepa)  Insulin  (Humulin)  Ipratropium (Atrovent )  Ipratropium (Duoneb)  Iron  Lisinopril  (Zestril )  Magnesium  (Mag-Ox)  Metformin  (Glucophage )  Metronidazole  (MetroGel )  Montelukast  (Singulair )  Naloxone  (Narcan )  Pantoprazole  (Protonix )  Rosuvastatin  (Crestor )  Sulfamethoxazole  (Bactrim )  Terbinafine  (Lamisil )  Testosterone   Tirzepatide (Mounjaro)  Topical  Trimethoprim  (Bactrim )  Vitamin B12  Vitamin C ==================================================================== For clinical consultation, please call [phone]. ====================================================================     No results found for: CBDTHCR No results found for: D8THCCBX No results found for: D9THCCBX  ROS  Constitutional: Denies any fever or chills Gastrointestinal: No reported hemesis, hematochezia, vomiting, or acute GI distress Musculoskeletal: Denies any acute  onset joint swelling, redness, loss of ROM, or weakness Neurological: No reported episodes of acute onset apraxia, aphasia, dysarthria, agnosia, amnesia, paralysis, loss of coordination, or loss of consciousness  Medication Review  Clindamycin-Benzoyl Per (Refr), EPINEPHrine , HYDROcodone -acetaminophen , Magnesium  Oxide -Mg Supplement, NEEDLE (DISP) 18 G, NEEDLE (DISP) 21 G, PRESCRIPTION MEDICATION, albuterol , amitriptyline , ascorbic acid, aspirin  EC, benralizumab, budesonide -glycopyrrolate -formoterol , cefPROZIL , cyanocobalamin , dapagliflozin  propanediol, ferrous sulfate , fluticasone , gabapentin , hydrochlorothiazide , icosapent Ethyl, insulin  isophane & regular human KwikPen, ipratropium, ipratropium-albuterol , lisinopril , lubiprostone , metFORMIN , montelukast , multivitamin, naloxone , pantoprazole , predniSONE , rosuvastatin , tazarotene , testosterone  cypionate, and tirzepatide  History Review  Allergy: Darren Allen is allergic to dupixent [dupilumab], glipizide, ciprofloxacin , cephalexin , and duloxetine. Drug: Darren Allen  reports current drug use. Drug: Hydrocodone . Alcohol:  reports that he does not currently use alcohol after a past usage of about 12.0 standard drinks of alcohol per week. Tobacco:  reports that he has quit smoking. His smoking use included cigarettes. He has a 17 pack-year smoking history. He has been exposed to tobacco smoke. He uses smokeless tobacco. Social: Darren Allen  reports that he has quit smoking. His smoking use included cigarettes. He has a 17 pack-year smoking history. He has been exposed to tobacco smoke. He uses smokeless tobacco. He reports that he does not currently use alcohol after a past usage of about 12.0 standard drinks of alcohol per week. He reports current drug use. Drug: Hydrocodone . Medical:  has a past medical history of Acute  nontraumatic kidney injury (01/21/2022), Allergy, Anxiety, Asthma, Chronic kidney disease, Chronic lower back pain, COPD (chronic obstructive  pulmonary disease) (HCC), Coronary artery disease, DDD (degenerative disc disease), lumbosacral, Depression, Diabetic peripheral neuropathy (HCC), Diastolic dysfunction (12/27/2020), Diverticulosis, Emphysema of lung (HCC), Erectile dysfunction, GERD (gastroesophageal reflux disease), Hepatic steatosis, History of kidney stones, Hyperlipidemia, Hypertension, Hypogonadism male, IDA (iron deficiency anemia), Long term current use of opiate analgesic, OSA on CPAP, Osteoporosis, Paraesophageal hernia, PVD (peripheral vascular disease), Sleep apnea, T2DM (type 2 diabetes mellitus) (HCC), Umbilical hernia, and Ventral hernia. Surgical: Darren Allen  has a past surgical history that includes Appendectomy; Incision and drainage perirectal abscess (N/A, 02/04/2015); Rectal exam under anesthesia (02/04/2015); ORIF ankle fracture (Left, 03/23/2017); Syndesmosis repair (Left, 03/23/2017); Colonoscopy with propofol  (N/A, 12/31/2021); Esophagogastroduodenoscopy (N/A, 12/31/2021); Kidney stone surgery (Right); lung mass removal (N/A); Xi robotic assisted paraesophageal hernia repair (N/A, 01/20/2022); Insertion of mesh (01/20/2022); Umbilical hernia repair (01/20/2022); Vasectomy; Circumcision; Bronchoscopy (2016); Muscle biopsy; Ventral hernia repair (N/A, 04/08/2023); Insertion of mesh (N/A, 04/08/2023); Hernia repair (07/2023); LEFT HEART CATH AND CORONARY ANGIOGRAPHY (Left, 08/16/2023); Abdominal hysterectomy; Ventral hernia repair (N/A, 10/13/2023); Excision of mesh (N/A, 10/19/2023); and Ventral hernia repair (N/A, 10/19/2023). Family: family history includes Alcohol abuse in his father; Cancer in his brother, father, maternal grandfather, mother, and paternal aunt; Colon cancer (age of onset: 34) in his father; Diabetes in his brother; Drug abuse in his daughter; Esophageal cancer (age of onset: 36) in his brother; Heart disease in his brother and father; Kidney disease in his brother; Lung cancer in his maternal aunt; Lung  cancer (age of onset: 52) in his mother.  Laboratory Chemistry Profile   Renal Lab Results  Component Value Date   BUN 16 12/08/2023   CREATININE 1.23 12/08/2023   LABCREA 46 10/18/2023   BCR 13 12/08/2023   GFRAA 59 (L) 12/07/2019   GFRNONAA 33 (L) 10/22/2023    Hepatic Lab Results  Component Value Date   AST 29 12/08/2023   ALT 30 11/02/2023   ALBUMIN  4.7 12/08/2023   ALKPHOS 57 12/08/2023   AMYLASE 93 02/04/2022   LIPASE 48 10/10/2023    Electrolytes Lab Results  Component Value Date   NA 142 12/08/2023   K 4.4 12/08/2023   CL 103 12/08/2023   CALCIUM  9.8 12/08/2023   MG 1.9 12/08/2023   PHOS 3.1 10/19/2023    Bone Lab Results  Component Value Date   25OHVITD1 45 12/08/2023   25OHVITD2 <1.0 12/08/2023   25OHVITD3 45 12/08/2023   TESTOSTERONE  349 09/13/2023    Inflammation (CRP: Acute Phase) (ESR: Chronic Phase) Lab Results  Component Value Date   CRP 3 12/08/2023   ESRSEDRATE 18 12/08/2023   LATICACIDVEN 1.9 10/22/2023         Note: Above Lab results reviewed.  Recent Imaging Review  US  SOFT TISSUE RT UPPER EXTREMITY LTD (NON-VASCULAR) EXAM: US  RIGHT UPPER EXTREMITY NONVASCULAR SOFT TISSUE 12/13/2023 05:25:39 PM  TECHNIQUE: Real-time ultrasound scan of the right upper extremity with image documentation.  COMPARISON: None available.  CLINICAL HISTORY: Bilateral shoulder and upper arm pain status post abdominal wound staph infection.  FINDINGS:  SOFT TISSUES: No abscess identified. No joint effusion. No obvious mass lesion on the biceps muscle. If assessment of biceps tendon in the shoulder or elbow is indicated, consider dedicated joint MRI of the affected region.  IMPRESSION: 1. No abscess or joint effusion identified. 2. No obvious mass lesion in the biceps muscle. 3. If assessment of biceps tendon in the shoulder or elbow  is indicated, consider dedicated joint MRI of the affected region.  Electronically signed by: Ryan Salvage  MD 12/17/2023 01:32 PM EDT RP Workstation: [id number] US  SOFT TISSUE UPPER EXTREMITY LIMITED LEFT (NON-VASCULAR) EXAM: US  LEFT UPPER EXTREMITY NONVASCULAR SOFT TISSUE 12/13/2023 05:25:39 PM  TECHNIQUE: Real-time ultrasound scan of the left upper extremity with image documentation.  COMPARISON: None available.  CLINICAL HISTORY: Left shoulder and upper arm pain status post abdominal wound staph infection.  FINDINGS:  SOFT TISSUES: No fluid collection to indicate a drainable abscess. No obvious fluid collection or clear mass lesion along the biceps muscle. No left shoulder joint effusion. Consider MRI in the affected region (shoulder or elbow) if workup for biceps tendon tear or other abnormality is warranted.  IMPRESSION: 1. No abscess or joint effusion. 2. No fluid collection or mass along the biceps muscle. 3. MRI can be considered if evaluating for biceps tendon tear or other abnormality.  Electronically signed by: Ryan Salvage MD 12/17/2023 01:30 PM EDT RP Workstation: [id number] Note: Reviewed        Physical Exam  Vitals: There were no vitals taken for this visit. BMI: Estimated body mass index is 31.07 kg/m as calculated from the following:   Height as of 12/08/23: 6' 2 (1.88 m).   Weight as of 12/08/23: 242 lb (109.8 kg). Ideal: Patient weight not recorded General appearance: Well nourished, well developed, and well hydrated. In no apparent acute distress Mental status: Alert, oriented x 3 (person, place, & time)       Respiratory: No evidence of acute respiratory distress Eyes: PERLA   Assessment   Diagnosis Status  No diagnosis found. Controlled Controlled Controlled   Updated Problems: No problems updated.  Plan of Care  Problem-specific:  Assessment and Plan            Darren Allen has a current medication list which includes the following long-term medication(s): ferrous sulfate , fluticasone , hydrochlorothiazide ,  hydrocodone -acetaminophen , hydrocodone -acetaminophen , [START ON 01/23/2024] hydrocodone -acetaminophen , humulin 70/30 kwikpen, ipratropium, lisinopril , metformin , montelukast , pantoprazole , rosuvastatin , testosterone  cypionate, and vascepa.  Pharmacotherapy (Medications Ordered): No orders of the defined types were placed in this encounter.  Orders:  No orders of the defined types were placed in this encounter.    Interventional Therapies  Risk Factors  Considerations:   WNL   Planned  Pending:      Under consideration:   Therapeutic right L5-S1 LESI #2  Possible spinal cord stimulator trial  Therapeutic left L5-S1 percutaneous discectomy with [employer] Dekompressor system    Completed:   Therapeutic right lumbar facet RFA x1 (07/13/2023) (100/100/80/80)  Therapeutic left lumbar facet RFA x1 (06/22/2023) (100/100/75/75) (w/ steroids)  Diagnostic bilateral lumbar facet MBB x1 (03/02/2023) (100/100/95/100) (w/ steroids)  Diagnostic bilateral lumbar facet MBB x1 (03/02/2023) (80-100/80-100/0/0) (w/o steroids)  Diagnostic midline to left caudal ESI x1 (04/02/2022) (100/100/100/LBP:100/LEP:100)  Therapeutic left L5-S1 LESI x3 (08/27/2022) (1st:100/100/100/LBP:85  LEP:100) (2nd: 01/13/59/LBP:60LLEP:100)  Therapeutic right L5-S1 LESI x1 (12/24/2022) (100/85/100/LBP:100)  Therapeutic bilateral Qutenza  neurolytic treatment x1 (12/23/2021)  (09/08/2021 & 10/29/2021) referral to physical therapy for evaluation and treatment of low back pain. Physical therapy for the lower back ordered.  (06/07/2023-referral entered)    Completed by other providers:   EMG/PNCV of lower extremity (02/20/2020) by Dr. Jannett Fairly (generalized sensorimotor peripheral neuropathy; superimposed left S1 radiculopathy)   Therapeutic  Palliative (PRN) options:   Diagnostic bilateral lumbar facet MBB #2 (without steroids) (PRN)     No follow-ups on file.    Recent Visits Date Type Provider Dept  12/08/23 Office  Visit Tanya Glisson, MD [facility]  11/11/23 Office Visit Patel, Seema K, NP [facility]  Showing recent visits within past 90 days and meeting all other requirements Future Appointments Date Type Provider Dept  01/05/24 Appointment Tanya Glisson, MD [facility]  02/01/24 Appointment Patel, Seema K, NP [facility]  Showing future appointments within next 90 days and meeting all other requirements  I discussed the assessment and treatment plan with the patient. The patient was provided an opportunity to ask questions and all were answered. The patient agreed with the plan and demonstrated an understanding of the instructions.  Patient advised to call back or seek an in-person evaluation if the symptoms or condition worsens.  Duration of encounter: *** minutes.  Total time on encounter, as per AMA guidelines included both the face-to-face and non-face-to-face time personally spent by the physician and/or other qualified health care professional(s) on the day of the encounter (includes time in activities that require the physician or other qualified health care professional and does not include time in activities normally performed by clinical staff). Physician's time may include the following activities when performed: Preparing to see the patient (e.g., pre-charting review of records, searching for previously ordered imaging, lab work, and nerve conduction tests) Review of prior analgesic pharmacotherapies. Reviewing PMP Interpreting ordered tests (e.g., lab work, imaging, nerve conduction tests) Performing post-procedure evaluations, including interpretation of diagnostic procedures Obtaining and/or reviewing separately obtained history Performing a medically appropriate examination and/or evaluation Counseling and educating the patient/family/caregiver Ordering medications, tests, or procedures Referring and communicating with other health  care professionals (when not separately reported) Documenting clinical information in the electronic or other health record Independently interpreting results (not separately reported) and communicating results to the patient/ family/caregiver Care coordination (not separately reported)  Note by: Glisson DELENA Tanya, MD (TTS and AI technology used. I apologize for any typographical errors that were not detected and corrected.) Date: 01/05/2024; Time: 5:29 AM

## 2024-01-05 ENCOUNTER — Encounter: Payer: Self-pay | Admitting: Pain Medicine

## 2024-01-05 ENCOUNTER — Ambulatory Visit: Attending: Pain Medicine | Admitting: Pain Medicine

## 2024-01-05 VITALS — BP 147/87 | HR 93 | Temp 98.2°F | Resp 20 | Ht 74.0 in | Wt 235.0 lb

## 2024-01-05 DIAGNOSIS — M25511 Pain in right shoulder: Secondary | ICD-10-CM | POA: Insufficient documentation

## 2024-01-05 DIAGNOSIS — G629 Polyneuropathy, unspecified: Secondary | ICD-10-CM | POA: Diagnosis present

## 2024-01-05 DIAGNOSIS — G894 Chronic pain syndrome: Secondary | ICD-10-CM | POA: Diagnosis present

## 2024-01-05 DIAGNOSIS — Z79891 Long term (current) use of opiate analgesic: Secondary | ICD-10-CM | POA: Diagnosis present

## 2024-01-05 DIAGNOSIS — M25512 Pain in left shoulder: Secondary | ICD-10-CM | POA: Insufficient documentation

## 2024-01-05 DIAGNOSIS — Z79899 Other long term (current) drug therapy: Secondary | ICD-10-CM | POA: Insufficient documentation

## 2024-01-05 DIAGNOSIS — M25611 Stiffness of right shoulder, not elsewhere classified: Secondary | ICD-10-CM | POA: Diagnosis present

## 2024-01-05 DIAGNOSIS — E1142 Type 2 diabetes mellitus with diabetic polyneuropathy: Secondary | ICD-10-CM | POA: Diagnosis present

## 2024-01-05 DIAGNOSIS — Z7984 Long term (current) use of oral hypoglycemic drugs: Secondary | ICD-10-CM

## 2024-01-05 DIAGNOSIS — G8929 Other chronic pain: Secondary | ICD-10-CM | POA: Insufficient documentation

## 2024-01-05 DIAGNOSIS — M25612 Stiffness of left shoulder, not elsewhere classified: Secondary | ICD-10-CM | POA: Diagnosis present

## 2024-01-05 MED ORDER — GABAPENTIN 600 MG PO TABS: 600.0000 mg | ORAL_TABLET | Freq: Four times a day (QID) | ORAL | 2 refills | Status: AC

## 2024-01-05 MED ORDER — NALOXONE HCL 4 MG/0.1ML NA LIQD: 1.0000 | NASAL | 0 refills | Status: AC | PRN

## 2024-01-05 NOTE — Patient Instructions (Signed)
 ______________________________________________________________________    OTC Supplements:   The following is a list of over-the-counter (OTC) supplements that have been found to have [employer]) studies suggesting that they may be of some benefits when used in moderation in some chronic pain-related conditions.  NOTE:  Always consult with your primary care provider and/or pharmacist before taking any OTC medications to make sure they will not interact with your current medications. Always use manufacturer's recommended dosage.  Supplement Possible benefit May be of benefit in treatment of   Turmeric/curcumin anti-inflammatory Joint and muscle aches and pain.  Glucosamine/chondroitin (triple strength) may slow loss of articular cartilage Joint pain.  Vitamin D-3* may suppress release of chemicals associated with inflammation. Increases tolerance to pain. Joint and muscle aches and pain.   Moringa(+) anti-inflammatory with mild analgesic effects Joint and muscle aches and pain.  Melatonin(+) Helps reset sleep cycle. Insomnia.  Vitamin B-12* may help keep nerves and blood cells healthy as well as maintaining function of nervous system Nerve pain (Burning pain)  Alpha-Lipoic-Acid (ALA)* antioxidant that may help with nerve health, pain, and blocking the activation of some inflammatory chemicals Diabetic neuropathy and metabolic syndrome  superoxide dismutase (SOD)** Currently being reviewed.   Tiger Balm Currently being reviewed.   hydrolyzed collagen peptides* Currently being reviewed.  Collagen supplementation may increases bone strength, density, and mass; may improve joint stiffness/mobility, and functionality; and may reduce joint pain. Possible chondroprotective effects. May help with protection of joint health.   Methylsulfonylmethane (MSM)* Currently being reviewed.   CBD(+) Currently being reviewed.   Delta-8 THC(+) Currently being reviewed.   *  Generally  Recognized As Safe (GRAS) approved substance.-FDA ([employer]) ** Possibly Safe, but not considered Generally Recognized As Safe (Not GRAS) by the [place]  Food and Drug Administration (FDA) as a food additive. (+) Not considered Generally Recognized As Safe (Not GRAS) by the [place]  Food and Drug Administration (FDA) as a food additive.  ______________________________________________________________________

## 2024-01-05 NOTE — Progress Notes (Signed)
 Safety precautions to be maintained throughout the outpatient stay will include: orient to surroundings, keep bed in low position, maintain call bell within reach at all times, provide assistance with transfer out of bed and ambulation.

## 2024-01-06 ENCOUNTER — Ambulatory Visit: Admitting: Dermatology

## 2024-01-07 ENCOUNTER — Ambulatory Visit
Admission: RE | Admit: 2024-01-07 | Discharge: 2024-01-07 | Disposition: A | Discharge status: Home / Self Care | Source: Ambulatory Visit | Attending: Pain Medicine | Admitting: Pain Medicine

## 2024-01-07 DIAGNOSIS — M25511 Pain in right shoulder: Secondary | ICD-10-CM | POA: Insufficient documentation

## 2024-01-07 DIAGNOSIS — G8929 Other chronic pain: Secondary | ICD-10-CM | POA: Diagnosis present

## 2024-01-07 DIAGNOSIS — M25512 Pain in left shoulder: Secondary | ICD-10-CM | POA: Insufficient documentation

## 2024-01-07 DIAGNOSIS — M25612 Stiffness of left shoulder, not elsewhere classified: Secondary | ICD-10-CM | POA: Diagnosis present

## 2024-01-07 DIAGNOSIS — M25611 Stiffness of right shoulder, not elsewhere classified: Secondary | ICD-10-CM | POA: Diagnosis present

## 2024-01-24 NOTE — Progress Notes (Unsigned)
 PROVIDER NOTE: Interpretation of information contained herein should be left to medically-trained personnel. Specific patient instructions are provided elsewhere under Patient Instructions section of medical record. This document was created in part using AI and STT-dictation technology, any transcriptional errors that may result from this process are unintentional.  Patient: Darren Allen  Service: E/M   PCP: Melvin Pao, NP  DOB: 1965-08-06  DOS: 01/26/2024  Provider: Eric DELENA Como, MD  MRN: [id number]  Delivery: Face-to-face  Specialty: Interventional Pain Management  Type: Established Patient  Setting: Ambulatory outpatient facility  Specialty designation: 09  Referring Prov.: Melvin Pao, NP  Location: Outpatient office facility       History of present illness (HPI) [name], a 58 y.o. year old male, is here today because of his No primary diagnosis found.. Darren Allen primary complain today is No chief complaint on file.  Pertinent problems: Darren Allen has Ankle fracture; Neuropathy; Diabetic peripheral neuropathy (HCC); Gout; Chronic ankle pain (Bilateral); Chronic low back pain (1ry area of Pain) (Bilateral) (R>L) w/o sciatica; Other acquired hammer toe; Chronic pain syndrome; Lumbar facet syndrome; Lumbosacral radiculopathy at S1 (Left); Chronic feet pain (2ry area of Pain) (Bilateral); Chronic ankle pain (Left); Abnormal NCS (LE) (nerve conduction studies) (02/20/2020); Abnormal MRI, lumbar spine (06/20/2022); Lumbosacral lateral recess stenosis (Left: L5-S1); Chronic low back pain (Bilateral) w/ sciatica (Left); Abnormal MRI, thoracic spine (07/05/2020); Chronic low back pain (Bilateral) w/ sciatica (Right); Chronic lower extremity pain (intermittent) (Right); Chronic lower extremity pain (intermittent) (Left); Lumbar radiculitis (Right); Lumbar radiculitis (Left); Lumbar facet joint pain; Spondylosis without myelopathy or radiculopathy, lumbosacral region; DDD  (degenerative disc disease), lumbosacral w/ LBP; Low back pain of over 3 months duration; Mechanical low back pain; Recurrent low back pain; Trigger point with back pain (Right); Chronic shoulder pain (Bilateral); and Impaired range of motion of shoulders (Bilateral) on their pertinent problem list.  Pain Assessment: Severity of   is reported as a  /10. Location:    / . Onset:  . Quality:  . Timing:  . Modifying factor(s):  SABRA Vitals:  vitals were not taken for this visit.  BMI: Estimated body mass index is 30.17 kg/m as calculated from the following:   Height as of 01/05/24: 6' 2 (1.88 m).   Weight as of 01/05/24: 235 lb (106.6 kg).  Last encounter: 01/05/2024. Last procedure: 07/13/2023.  Reason for encounter: follow-up evaluation.   Discussed the use of AI scribe software for clinical note transcription with the patient, who gave verbal consent to proceed.  History of Present Illness           Pharmacotherapy Assessment   Analgesic: Hydrocodone /APAP 5/325 tablet, 1 tab p.o. twice daily (#60) MME/day: 10 mg/day   Monitoring: [place] PMP: PDMP reviewed during this encounter.       Pharmacotherapy: No side-effects or adverse reactions reported. Compliance: No problems identified. Effectiveness: Clinically acceptable.  No notes on file  UDS:  Summary  Date Value Ref Range Status  08/11/2023 FINAL  Final    Comment:    ==================================================================== [employer] [facility] (MW) ==================================================================== Test                             Result       Flag       Units  Drug Present and Declared for Prescription Verification   Hydrocodone   417          EXPECTED   ng/mg creat   Norhydrocodone                 434          EXPECTED   ng/mg creat    Sources of hydrocodone  include scheduled prescription medications.    Norhydrocodone is an expected metabolite of  hydrocodone .  ==================================================================== Test                      Result    Flag   Units      Ref Range   Creatinine              89               mg/dL      >=79 ==================================================================== Declared Medications:  The flagging and interpretation on this report are based on the  following declared medications.  Unexpected results may arise from  inaccuracies in the declared medications.   **Note: The testing scope of this panel includes these medications:   Hydrocodone    **Note: The testing scope of this panel does not include the  following reported medications:   Acetaminophen   Albuterol  (Proair  HFA)  Albuterol  (Duoneb)  Amitriptyline  (Elavil )  Betamethasone  (Lotrisone )  Budesonide  (Breztri  Aerosphere)  Clotrimazole  (Lotrisone )  Dapagliflozin  (Farxiga )  Fluticasone  (Flonase )  Formoterol  (Breztri  Aerosphere)  Gabapentin  (Neurontin )  Glycopyrrolate  (Breztri  Aerosphere)  Hydrochlorothiazide  (Hydrodiuril )  Icosapent (Vascepa)  Insulin  (Humulin)  Ipratropium (Atrovent )  Ipratropium (Duoneb)  Iron  Lisinopril  (Zestril )  Magnesium  (Mag-Ox)  Metformin  (Glucophage )  Metronidazole  (MetroGel )  Montelukast  (Singulair )  Naloxone  (Narcan )  Pantoprazole  (Protonix )  Rosuvastatin  (Crestor )  Sulfamethoxazole  (Bactrim )  Terbinafine  (Lamisil )  Testosterone   Tirzepatide (Mounjaro)  Topical  Trimethoprim  (Bactrim )  Vitamin B12  Vitamin C ==================================================================== For clinical consultation, please call [phone]. ====================================================================     No results found for: CBDTHCR No results found for: D8THCCBX No results found for: D9THCCBX  ROS  Constitutional: Denies any fever or chills Gastrointestinal: No reported hemesis, hematochezia, vomiting, or acute GI distress Musculoskeletal: Denies any acute  onset joint swelling, redness, loss of ROM, or weakness Neurological: No reported episodes of acute onset apraxia, aphasia, dysarthria, agnosia, amnesia, paralysis, loss of coordination, or loss of consciousness  Medication Review  Clindamycin-Benzoyl Per (Refr), EPINEPHrine , HYDROcodone -acetaminophen , Magnesium  Oxide -Mg Supplement, NEEDLE (DISP) 18 G, NEEDLE (DISP) 21 G, PRESCRIPTION MEDICATION, albuterol , amitriptyline , ascorbic acid, aspirin  EC, benralizumab, budesonide -glycopyrrolate -formoterol , cefPROZIL , cyanocobalamin , dapagliflozin  propanediol, ferrous sulfate , fluticasone , gabapentin , hydrochlorothiazide , icosapent Ethyl, insulin  isophane & regular human KwikPen, ipratropium, ipratropium-albuterol , lisinopril , lubiprostone , metFORMIN , montelukast , multivitamin, naloxone , pantoprazole , predniSONE , rosuvastatin , tazarotene , testosterone  cypionate, and tirzepatide  History Review  Allergy: Darren Allen is allergic to dupixent [dupilumab], glipizide, ciprofloxacin , cephalexin , and duloxetine. Drug: Darren Allen  reports current drug use. Drug: Hydrocodone . Alcohol:  reports that he does not currently use alcohol after a past usage of about 12.0 standard drinks of alcohol per week. Tobacco:  reports that he has quit smoking. His smoking use included cigarettes. He has a 17 pack-year smoking history. He has been exposed to tobacco smoke. He uses smokeless tobacco. Social: Darren Allen  reports that he has quit smoking. His smoking use included cigarettes. He has a 17 pack-year smoking history. He has been exposed to tobacco smoke. He uses smokeless tobacco. He reports that he does not currently use alcohol after a past usage of about 12.0 standard drinks of alcohol per week. He reports current drug use. Drug:  Hydrocodone . Medical:  has a past medical history of Acute nontraumatic kidney injury (01/21/2022), Allergy, Anxiety, Asthma, Chronic kidney disease, Chronic lower back pain, COPD (chronic obstructive  pulmonary disease) (HCC), Coronary artery disease, DDD (degenerative disc disease), lumbosacral, Depression, Diabetic peripheral neuropathy (HCC), Diastolic dysfunction (12/27/2020), Diverticulosis, Emphysema of lung (HCC), Erectile dysfunction, GERD (gastroesophageal reflux disease), Hepatic steatosis, History of kidney stones, Hyperlipidemia, Hypertension, Hypogonadism male, IDA (iron deficiency anemia), Long term current use of opiate analgesic, OSA on CPAP, Osteoporosis, Paraesophageal hernia, PVD (peripheral vascular disease), Sleep apnea, T2DM (type 2 diabetes mellitus) (HCC), Umbilical hernia, and Ventral hernia. Surgical: Darren Allen  has a past surgical history that includes Appendectomy; Incision and drainage perirectal abscess (N/A, 02/04/2015); Rectal exam under anesthesia (02/04/2015); ORIF ankle fracture (Left, 03/23/2017); Syndesmosis repair (Left, 03/23/2017); Colonoscopy with propofol  (N/A, 12/31/2021); Esophagogastroduodenoscopy (N/A, 12/31/2021); Kidney stone surgery (Right); lung mass removal (N/A); Xi robotic assisted paraesophageal hernia repair (N/A, 01/20/2022); Insertion of mesh (01/20/2022); Umbilical hernia repair (01/20/2022); Vasectomy; Circumcision; Bronchoscopy (2016); Muscle biopsy; Ventral hernia repair (N/A, 04/08/2023); Insertion of mesh (N/A, 04/08/2023); Hernia repair (07/2023); LEFT HEART CATH AND CORONARY ANGIOGRAPHY (Left, 08/16/2023); Abdominal hysterectomy; Ventral hernia repair (N/A, 10/13/2023); Excision of mesh (N/A, 10/19/2023); and Ventral hernia repair (N/A, 10/19/2023). Family: family history includes Alcohol abuse in his father; Cancer in his brother, father, maternal grandfather, mother, and paternal aunt; Colon cancer (age of onset: 5) in his father; Diabetes in his brother; Drug abuse in his daughter; Esophageal cancer (age of onset: 62) in his brother; Heart disease in his brother and father; Kidney disease in his brother; Lung cancer in his maternal aunt; Lung  cancer (age of onset: 14) in his mother.  Laboratory Chemistry Profile   Renal Lab Results  Component Value Date   BUN 16 12/08/2023   CREATININE 1.23 12/08/2023   LABCREA 46 10/18/2023   BCR 13 12/08/2023   GFRAA 59 (L) 12/07/2019   GFRNONAA 33 (L) 10/22/2023    Hepatic Lab Results  Component Value Date   AST 29 12/08/2023   ALT 30 11/02/2023   ALBUMIN  4.7 12/08/2023   ALKPHOS 57 12/08/2023   AMYLASE 93 02/04/2022   LIPASE 48 10/10/2023    Electrolytes Lab Results  Component Value Date   NA 142 12/08/2023   K 4.4 12/08/2023   CL 103 12/08/2023   CALCIUM  9.8 12/08/2023   MG 1.9 12/08/2023   PHOS 3.1 10/19/2023    Bone Lab Results  Component Value Date   25OHVITD1 45 12/08/2023   25OHVITD2 <1.0 12/08/2023   25OHVITD3 45 12/08/2023   TESTOSTERONE  349 09/13/2023    Inflammation (CRP: Acute Phase) (ESR: Chronic Phase) Lab Results  Component Value Date   CRP 3 12/08/2023   ESRSEDRATE 18 12/08/2023   LATICACIDVEN 1.9 10/22/2023         Note: Above Lab results reviewed.  Recent Imaging Review  MR SHOULDER RIGHT WO CONTRAST EXAM: MRI OF THE RIGHT SHOULDER WITHOUT CONTRAST 01/07/2024 10:39:34 AM  TECHNIQUE: Multiplanar multisequence MRI of the right shoulder was performed without the administration of intravenous contrast.  COMPARISON: None available.  CLINICAL HISTORY: Shoulder pain, chronic, bursitis suspected, erosive osteoarthritis suspected, limited range of motion.  FINDINGS:  LIMITATIONS/ARTIFACTS: Motion artifact is present, reducing diagnostic sensitivity and specificity.  ROTATOR CUFF: Moderate supraspinatus tendinopathy. Intact infraspinatus, subscapularis and teres minor tendons. No significant muscle edema or atrophy.  BICEPS TENDON: Long head biceps tendon is intact and normally located.  LABRUM: The glenoid labrum is intact to the extent that it is  visualized. No paralabral cyst.  GLENOHUMERAL JOINT: Mild degenerative  glenohumeral chondral thinning. Physiologic amount of joint fluid. Normal alignment.  AC JOINT AND ACROMIOCLAVICULAR ARCH: Mild degenerative AC joint arthropathy with associated spurring. No significant acromial downsloping or subacromial spur. Intact acromioclavicular and coracoclavicular ligaments.  BURSA: Trace subacromial subdeltoid bursitis.  BONE MARROW: No acute fracture or aggressive marrow replacing lesion.  OUTLET SPACES: Normal MRI appearance of the quadrilateral space. No significant narrowing of the supraspinatus outlet.  SOFT TISSUES: No focal abnormality of the subcutaneous soft tissues.  IMPRESSION: 1. Moderate supraspinatus tendinopathy. 2. Mild degenerative acromioclavicular joint arthropathy with spurring and trace subacromial-subdeltoid bursitis. 3. Mild degenerative glenohumeral chondral thinning. 4. Motion artifact limits evaluation.  Electronically signed by: Ryan Salvage MD 01/10/2024 01:40 PM EDT RP Workstation: [id number] MR SHOULDER LEFT WO CONTRAST EXAM: MRI OF THE LEFT SHOULDER WITHOUT CONTRAST 01/07/2024 10:37:39 AM  TECHNIQUE: Multiplanar multisequence MRI of the left shoulder was performed without the administration of intravenous contrast.  COMPARISON: None available.  CLINICAL HISTORY: Chronic left shoulder pain. Limited range of motion. Evaluation for bursitis. 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  FINDINGS:  ROTATOR CUFF: Mild supraspinatus and subscapularis tendinopathy distally. Intact infraspinatus and teres minor tendons. No significant muscle edema or atrophy.  BICEPS TENDON: Mild tendinopathy of the intraarticular segment of the long head of the biceps. Long head biceps tendon is normally located.  LABRUM: The glenoid labrum is intact to the extent that it is visualized. No paralabral cyst.  GLENOHUMERAL JOINT: Physiologic amount of joint fluid. Moderate degenerative chondral thinning in the glenohumeral joint  as on image 16 series 6. Normal alignment.  AC JOINT AND ACROMIOCLAVICULAR ARCH: Type 2 (curved) subacromial morphology. No significant acromial downsloping or subacromial spur. No significant degenerative changes. Intact acromioclavicular and coracoclavicular ligaments.  BURSA: No significant subacromial/subdeltoid bursitis.  BONE MARROW: No acute fracture or aggressive marrow replacing lesion.  OUTLET SPACES: Normal MRI appearance of the quadrilateral space. No significant narrowing of the supraspinatus outlet.  SOFT TISSUES: No focal abnormality of the subcutaneous soft tissues.  IMPRESSION: 1. Moderate degenerative chondral thinning in the glenohumeral joint. 2. Mild supraspinatus tendinopathy. 3. Mild subscapularis tendinopathy. 4. Mild tendinopathy of the intraarticular segment of the long head of the biceps tendon.  Electronically signed by: Ryan Salvage MD 01/10/2024 01:37 PM EDT RP Workstation: [id number] Note: Reviewed        Physical Exam  Vitals: There were no vitals taken for this visit. BMI: Estimated body mass index is 30.17 kg/m as calculated from the following:   Height as of 01/05/24: 6' 2 (1.88 m).   Weight as of 01/05/24: 235 lb (106.6 kg). Ideal: Patient weight not recorded General appearance: Well nourished, well developed, and well hydrated. In no apparent acute distress Mental status: Alert, oriented x 3 (person, place, & time)       Respiratory: No evidence of acute respiratory distress Eyes: PERLA   Assessment   Diagnosis Status  No diagnosis found. Controlled Controlled Controlled   Updated Problems: No problems updated.  Plan of Care  Problem-specific:  Assessment and Plan            Darren Allen has a current medication list which includes the following long-term medication(s): ferrous sulfate , fluticasone , gabapentin , hydrochlorothiazide , hydrocodone -acetaminophen , hydrocodone -acetaminophen ,  hydrocodone -acetaminophen , humulin 70/30 kwikpen, ipratropium, lisinopril , metformin , montelukast , pantoprazole , rosuvastatin , testosterone  cypionate, and vascepa.  Pharmacotherapy (Medications Ordered): No orders of the defined types were placed in this encounter.  Orders:  No orders of the defined types were placed in  this encounter.    Interventional Therapies  Risk Factors  Considerations:   WNL   Planned  Pending:   Diagnostic bilateral shoulder MRI ordered (01/05/2024)    Under consideration:   Therapeutic right L5-S1 LESI #2  Possible spinal cord stimulator trial  Therapeutic left L5-S1 percutaneous discectomy with [employer] Dekompressor system    Completed:   Therapeutic right lumbar facet RFA x1 (07/13/2023) (100/100/80/80)  Therapeutic left lumbar facet RFA x1 (06/22/2023) (100/100/75/75) (w/ steroids)  Diagnostic bilateral lumbar facet MBB x1 (03/02/2023) (100/100/95/100) (w/ steroids)  Diagnostic bilateral lumbar facet MBB x1 (03/02/2023) (80-100/80-100/0/0) (w/o steroids)  Diagnostic midline to left caudal ESI x1 (04/02/2022) (100/100/100/LBP:100/LEP:100)  Therapeutic left L5-S1 LESI x3 (08/27/2022) (1st:100/100/100/LBP:85  LEP:100) (2nd: 01/13/59/LBP:60LLEP:100)  Therapeutic right L5-S1 LESI x1 (12/24/2022) (100/85/100/LBP:100)  Therapeutic bilateral Qutenza  neurolytic treatment x1 (12/23/2021)  (09/08/2021 & 10/29/2021) referral to physical therapy for evaluation and treatment of low back pain. Physical therapy for the lower back ordered.  (06/07/2023-referral entered)    Completed by other providers:   EMG/PNCV of lower extremity (02/20/2020) by Dr. Jannett Fairly (generalized sensorimotor peripheral neuropathy; superimposed left S1 radiculopathy)   Therapeutic  Palliative (PRN) options:   Diagnostic bilateral lumbar facet MBB #2 (without steroids) (PRN)     No follow-ups on file.    Recent Visits Date Type Provider Dept  01/05/24 Office Visit Tanya Glisson, MD [facility]  12/08/23 Office Visit Tanya Glisson, MD [facility]  11/11/23 Office Visit Patel, Seema K, NP [facility]  Showing recent visits within past 90 days and meeting all other requirements Future Appointments Date Type Provider Dept  01/26/24 Appointment Tanya Glisson, MD [facility]  02/01/24 Appointment Patel, Seema K, NP [facility]  02/17/24 Appointment Patel, Seema K, NP [facility]  Showing future appointments within next 90 days and meeting all other requirements  I discussed the assessment and treatment plan with the patient. The patient was provided an opportunity to ask questions and all were answered. The patient agreed with the plan and demonstrated an understanding of the instructions.  Patient advised to call back or seek an in-person evaluation if the symptoms or condition worsens.  Duration of encounter: *** minutes.  Total time on encounter, as per AMA guidelines included both the face-to-face and non-face-to-face time personally spent by the physician and/or other qualified health care professional(s) on the day of the encounter (includes time in activities that require the physician or other qualified health care professional and does not include time in activities normally performed by clinical staff). Physician's time may include the following activities when performed: Preparing to see the patient (e.g., pre-charting review of records, searching for previously ordered imaging, lab work, and nerve conduction tests) Review of prior analgesic pharmacotherapies. Reviewing PMP Interpreting ordered tests (e.g., lab work, imaging, nerve conduction tests) Performing post-procedure evaluations, including interpretation of diagnostic procedures Obtaining and/or reviewing separately obtained history Performing a medically appropriate examination and/or evaluation Counseling and educating the  patient/family/caregiver Ordering medications, tests, or procedures Referring and communicating with other health care professionals (when not separately reported) Documenting clinical information in the electronic or other health record Independently interpreting results (not separately reported) and communicating results to the patient/ family/caregiver Care coordination (not separately reported)  Note by: Glisson DELENA Tanya, MD (TTS and AI technology used. I apologize for any typographical errors that were not detected and corrected.) Date: 01/26/2024; Time: 6:21 AM

## 2024-01-25 ENCOUNTER — Other Ambulatory Visit: Payer: Self-pay | Admitting: Urology

## 2024-01-25 DIAGNOSIS — E291 Testicular hypofunction: Secondary | ICD-10-CM

## 2024-01-26 ENCOUNTER — Encounter: Payer: Self-pay | Admitting: Pain Medicine

## 2024-01-26 ENCOUNTER — Ambulatory Visit: Attending: Pain Medicine | Admitting: Pain Medicine

## 2024-01-26 VITALS — BP 138/84 | HR 96 | Temp 99.7°F | Ht 74.0 in | Wt 235.0 lb

## 2024-01-26 DIAGNOSIS — M7582 Other shoulder lesions, left shoulder: Secondary | ICD-10-CM | POA: Diagnosis present

## 2024-01-26 DIAGNOSIS — M25612 Stiffness of left shoulder, not elsewhere classified: Secondary | ICD-10-CM | POA: Insufficient documentation

## 2024-01-26 DIAGNOSIS — M7551 Bursitis of right shoulder: Secondary | ICD-10-CM | POA: Diagnosis present

## 2024-01-26 DIAGNOSIS — M79622 Pain in left upper arm: Secondary | ICD-10-CM | POA: Insufficient documentation

## 2024-01-26 DIAGNOSIS — M94211 Chondromalacia, right shoulder: Secondary | ICD-10-CM | POA: Insufficient documentation

## 2024-01-26 DIAGNOSIS — M7592 Shoulder lesion, unspecified, left shoulder: Secondary | ICD-10-CM | POA: Insufficient documentation

## 2024-01-26 DIAGNOSIS — M25511 Pain in right shoulder: Secondary | ICD-10-CM | POA: Diagnosis present

## 2024-01-26 DIAGNOSIS — M94212 Chondromalacia, left shoulder: Secondary | ICD-10-CM | POA: Insufficient documentation

## 2024-01-26 DIAGNOSIS — M25611 Stiffness of right shoulder, not elsewhere classified: Secondary | ICD-10-CM | POA: Diagnosis present

## 2024-01-26 DIAGNOSIS — M79621 Pain in right upper arm: Secondary | ICD-10-CM | POA: Insufficient documentation

## 2024-01-26 DIAGNOSIS — M25512 Pain in left shoulder: Secondary | ICD-10-CM | POA: Diagnosis present

## 2024-01-26 DIAGNOSIS — G8929 Other chronic pain: Secondary | ICD-10-CM | POA: Diagnosis present

## 2024-01-26 DIAGNOSIS — M7590 Shoulder lesion, unspecified, unspecified shoulder: Secondary | ICD-10-CM | POA: Diagnosis present

## 2024-01-26 DIAGNOSIS — M7591 Shoulder lesion, unspecified, right shoulder: Secondary | ICD-10-CM | POA: Insufficient documentation

## 2024-01-26 DIAGNOSIS — M7522 Bicipital tendinitis, left shoulder: Secondary | ICD-10-CM | POA: Diagnosis present

## 2024-01-26 DIAGNOSIS — M19011 Primary osteoarthritis, right shoulder: Secondary | ICD-10-CM | POA: Insufficient documentation

## 2024-01-26 DIAGNOSIS — R936 Abnormal findings on diagnostic imaging of limbs: Secondary | ICD-10-CM | POA: Insufficient documentation

## 2024-01-26 DIAGNOSIS — M7581 Other shoulder lesions, right shoulder: Secondary | ICD-10-CM | POA: Diagnosis present

## 2024-01-26 NOTE — Patient Instructions (Signed)
 ______________________________________________________________________    Procedure instructions  Stop blood-thinners  Do not eat or drink fluids (other than water ) for 6 hours before your procedure  No water  for 2 hours before your procedure  Take your blood pressure medicine with a sip of water   Arrive 30 minutes before your appointment  If sedation is planned, bring suitable driver. [place], [place], & public transportation are NOT APPROVED)  Carefully read the Preparing for your procedure detailed instructions  If you have questions call us  at [phone]  Procedure appointments are for procedures only.   NO medication refills or new problem evaluations will be done on procedure days.   Only the scheduled, pre-approved procedure and side will be done.   ______________________________________________________________________     ______________________________________________________________________    Preparing for your procedure  Appointments: If you think you may not be able to keep your appointment, call 24-48 hours in advance to cancel. We need time to make it available to others.  Procedure visits are for procedures only. During your procedure appointment there will be: NO Prescription Refills*. NO medication changes or discussions*. NO discussion of disability issues*. NO unrelated pain problem evaluations*. NO evaluations to order other pain procedures*. *These will be addressed at a separate and distinct evaluation encounter on the provider's evaluation schedule and not during procedure days.  Instructions: Food intake: Avoid eating anything solid for at least 8 hours prior to your procedure. Clear liquid intake: You may take clear liquids such as water  up to 2 hours prior to your procedure. (No carbonated drinks. No soda.) Transportation: Unless otherwise stated by your physician, bring a driver. (Driver cannot be a Market researcher, Pharmacist, community, or any other form of public  transportation.) Morning Medicines: Except for blood thinners, take all of your other morning medications with a sip of water . Make sure to take your heart and blood pressure medicines. If your blood pressure's lower number is above 100, the case will be rescheduled. Blood thinners: Make sure to stop your blood thinners as instructed.  If you take a blood thinner, but were not instructed to stop it, call our office [phone] and ask to talk to a nurse. Not stopping a blood thinner prior to certain procedures could lead to serious complications. Diabetics on insulin : Notify the staff so that you can be scheduled 1st case in the morning. If your diabetes requires high dose insulin , take only  of your normal insulin  dose the morning of the procedure and notify the staff that you have done so. Preventing infections: Shower with an antibacterial soap the morning of your procedure.  Build-up your immune system: Take 1000 mg of Vitamin C with every meal (3 times a day) the day prior to your procedure. Antibiotics: Inform the nursing staff if you are taking any antibiotics or if you have any conditions that may require antibiotics prior to procedures. (Example: recent joint implants)   Pregnancy: If you are pregnant make sure to notify the nursing staff. Not doing so may result in injury to the fetus, including death.  Sickness: If you have a cold, fever, or any active infections, call and cancel or reschedule your procedure. Receiving steroids while having an infection may result in complications. Arrival: You must be in the facility at least 30 minutes prior to your scheduled procedure. Tardiness: Your scheduled time is also the cutoff time. If you do not arrive at least 15 minutes prior to your procedure, you will be rescheduled.  Children: Do not bring any children with  you. Make arrangements to keep them home. Dress appropriately: There is always a possibility that your clothing may get soiled. Avoid  long dresses. Valuables: Do not bring any jewelry or valuables.  Reasons to call and reschedule or cancel your procedure: (Following these recommendations will minimize the risk of a serious complication.) Surgeries: Avoid having procedures within 2 weeks of any surgery. (Avoid for 2 weeks before or after any surgery). Flu Shots: Avoid having procedures within 2 weeks of a flu shots or . (Avoid for 2 weeks before or after immunizations). Barium: Avoid having a procedure within 7-10 days after having had a radiological study involving the use of radiological contrast. (Myelograms, Barium swallow or enema study). Heart attacks: Avoid any elective procedures or surgeries for the initial 6 months after a Myocardial Infarction (Heart Attack). Blood thinners: It is imperative that you stop these medications before procedures. Let us  know if you if you take any blood thinner.  Infection: Avoid procedures during or within two weeks of an infection (including chest colds or gastrointestinal problems). Symptoms associated with infections include: Localized redness, fever, chills, night sweats or profuse sweating, burning sensation when voiding, cough, congestion, stuffiness, runny nose, sore throat, diarrhea, nausea, vomiting, cold or Flu symptoms, recent or current infections. It is specially important if the infection is over the area that we intend to treat. Heart and lung problems: Symptoms that may suggest an active cardiopulmonary problem include: cough, chest pain, breathing difficulties or shortness of breath, dizziness, ankle swelling, uncontrolled high or unusually low blood pressure, and/or palpitations. If you are experiencing any of these symptoms, cancel your procedure and contact your primary care physician for an evaluation.  Remember:  Regular Business hours are:  Monday to Thursday 8:00 AM to 4:00 PM  Provider's Schedule: Eric Como, MD:  Procedure days: Tuesday and Thursday 7:30  AM to 4:00 PM  Wallie Sherry, MD:  Procedure days: Monday and Wednesday 7:30 AM to 4:00 PM Last  Updated: 02/23/2023 ______________________________________________________________________     ______________________________________________________________________    General Risks and Possible Complications  Patient Responsibilities: It is important that you read this as it is part of your informed consent. It is our duty to inform you of the risks and possible complications associated with treatments offered to you. It is your responsibility as a patient to read this and to ask questions about anything that is not clear or that you believe was not covered in this document.  Patient's Rights: You have the right to refuse treatment. You also have the right to change your mind, even after initially having agreed to have the treatment done. However, under this last option, if you wait until the last second to change your mind, you may be charged for the materials used up to that point.  Introduction: Medicine is not an Visual merchandiser. Everything in Medicine, including the lack of treatment(s), carries the potential for danger, harm, or loss (which is by definition: Risk). In Medicine, a complication is a secondary problem, condition, or disease that can aggravate an already existing one. All treatments carry the risk of possible complications. The fact that a side effects or complications occurs, does not imply that the treatment was conducted incorrectly. It must be clearly understood that these can happen even when everything is done following the highest safety standards.  No treatment: You can choose not to proceed with the proposed treatment alternative. The "PRO(s)" would include: avoiding the risk of complications associated with the therapy. The "CON(s)" would include:  not getting any of the treatment benefits. These benefits fall under one of three categories: diagnostic; therapeutic; and/or  palliative. Diagnostic benefits include: getting information which can ultimately lead to improvement of the disease or symptom(s). Therapeutic benefits are those associated with the successful treatment of the disease. Finally, palliative benefits are those related to the decrease of the primary symptoms, without necessarily curing the condition (example: decreasing the pain from a flare-up of a chronic condition, such as incurable terminal cancer).  General Risks and Complications: These are associated to most interventional treatments. They can occur alone, or in combination. They fall under one of the following six (6) categories: no benefit or worsening of symptoms; bleeding; infection; nerve damage; allergic reactions; and/or death. No benefits or worsening of symptoms: In Medicine there are no guarantees, only probabilities. No healthcare provider can ever guarantee that a medical treatment will work, they can only state the probability that it may. Furthermore, there is always the possibility that the condition may worsen, either directly, or indirectly, as a consequence of the treatment. Bleeding: This is more common if the patient is taking a blood thinner, either prescription or over the counter (example: Goody Powders, Fish oil, Aspirin, Garlic, etc.), or if suffering a condition associated with impaired coagulation (example: Hemophilia, cirrhosis of the liver, low platelet counts, etc.). However, even if you do not have one on these, it can still happen. If you have any of these conditions, or take one of these drugs, make sure to notify your treating physician. Infection: This is more common in patients with a compromised immune system, either due to disease (example: diabetes, cancer, human immunodeficiency virus [HIV], etc.), or due to medications or treatments (example: therapies used to treat cancer and rheumatological diseases). However, even if you do not have one on these, it can still  happen. If you have any of these conditions, or take one of these drugs, make sure to notify your treating physician. Nerve Damage: This is more common when the treatment is an invasive one, but it can also happen with the use of medications, such as those used in the treatment of cancer. The damage can occur to small secondary nerves, or to large primary ones, such as those in the spinal cord and brain. This damage may be temporary or permanent and it may lead to impairments that can range from temporary numbness to permanent paralysis and/or brain death. Allergic Reactions: Any time a substance or material comes in contact with our body, there is the possibility of an allergic reaction. These can range from a mild skin rash (contact dermatitis) to a severe systemic reaction (anaphylactic reaction), which can result in death. Death: In general, any medical intervention can result in death, most of the time due to an unforeseen complication. ______________________________________________________________________      ______________________________________________________________________    Steroid injections  Common steroids for injections Triamcinolone: Used by many sports medicine physicians for large joint and bursal injections, often combined with a local anesthetic like lidocaine . A study focusing on coccydynia (tailbone pain) found triamcinolone was more effective than betamethasone , suggesting it may also be preferable for other localized inflammation conditions. Methylprednisolone: A common alternative to triamcinolone that is also a strong anti-inflammatory. It is available in different formulations, with the acetate suspension being the long-acting option for intra-articular injections. Dexamethasone : This is a non-particulate steroid, meaning it has a lower risk of tissue damage compared to particulate steroids like triamcinolone and methylprednisolone. While less common for this specific  use,  it is an option for targeted injections.   Considerations for physicians Particulate vs. non-particulate steroids: Triamcinolone and methylprednisolone are particulate, meaning they can clump together. Dexamethasone  is non-particulate. Particulate steroids are often preferred for their longer-lasting effects but carry a theoretical higher risk for certain injections (though this is less of a concern in the costochondral joints). Combined injectate: Corticosteroids are typically mixed with a local anesthetic like lidocaine  to provide both immediate pain relief (from the anesthetic) and longer-term inflammation reduction (from the steroid). Imaging guidance: To ensure accurate placement of the needle and medication, physicians may use ultrasound or fluoroscopic guidance for the injection, especially in complex or refractory cases.   Patient guidance Before undergoing a steroid injection, discuss the options with your physician. They will determine the best steroid, dosage, and procedure for your specific case based on factors like: Severity of your condition History of response to other treatments Your overall health status Experience and preference of the physician  Last  Updated: 11/09/2023 ______________________________________________________________________

## 2024-01-26 NOTE — Progress Notes (Signed)
 Safety precautions to be maintained throughout the outpatient stay will include: orient to surroundings, keep bed in low position, maintain call bell within reach at all times, provide assistance with transfer out of bed and ambulation.

## 2024-02-01 ENCOUNTER — Encounter: Payer: Self-pay | Admitting: Nurse Practitioner

## 2024-02-01 ENCOUNTER — Ambulatory Visit: Attending: Nurse Practitioner | Admitting: Nurse Practitioner

## 2024-02-01 VITALS — BP 153/84 | HR 102 | Temp 99.2°F | Resp 16 | Ht 74.0 in | Wt 235.0 lb

## 2024-02-01 DIAGNOSIS — G8929 Other chronic pain: Secondary | ICD-10-CM | POA: Diagnosis present

## 2024-02-01 DIAGNOSIS — M7591 Shoulder lesion, unspecified, right shoulder: Secondary | ICD-10-CM | POA: Insufficient documentation

## 2024-02-01 DIAGNOSIS — M79672 Pain in left foot: Secondary | ICD-10-CM | POA: Diagnosis present

## 2024-02-01 DIAGNOSIS — Z79891 Long term (current) use of opiate analgesic: Secondary | ICD-10-CM | POA: Diagnosis present

## 2024-02-01 DIAGNOSIS — Z79899 Other long term (current) drug therapy: Secondary | ICD-10-CM | POA: Diagnosis present

## 2024-02-01 DIAGNOSIS — G894 Chronic pain syndrome: Secondary | ICD-10-CM | POA: Insufficient documentation

## 2024-02-01 DIAGNOSIS — M47816 Spondylosis without myelopathy or radiculopathy, lumbar region: Secondary | ICD-10-CM | POA: Insufficient documentation

## 2024-02-01 DIAGNOSIS — M545 Low back pain, unspecified: Secondary | ICD-10-CM | POA: Insufficient documentation

## 2024-02-01 DIAGNOSIS — M79671 Pain in right foot: Secondary | ICD-10-CM | POA: Diagnosis present

## 2024-02-01 DIAGNOSIS — M5137 Other intervertebral disc degeneration, lumbosacral region with discogenic back pain only: Secondary | ICD-10-CM | POA: Insufficient documentation

## 2024-02-01 MED ORDER — HYDROCODONE-ACETAMINOPHEN 5-325 MG PO TABS: 1.0000 | ORAL_TABLET | Freq: Two times a day (BID) | ORAL | 0 refills | Status: DC | PRN

## 2024-02-01 MED ORDER — HYDROCODONE-ACETAMINOPHEN 5-325 MG PO TABS: 1.0000 | ORAL_TABLET | Freq: Two times a day (BID) | ORAL | 0 refills | Status: AC | PRN

## 2024-02-01 MED ORDER — HYDROCODONE-ACETAMINOPHEN 5-325 MG PO TABS
1.0000 | ORAL_TABLET | Freq: Two times a day (BID) | ORAL | 0 refills | Status: AC | PRN
Start: 2024-04-24 — End: 2024-05-24

## 2024-02-01 NOTE — Progress Notes (Signed)
 PROVIDER NOTE: Interpretation of information contained herein should be left to medically-trained personnel. Specific patient instructions are provided elsewhere under Patient Instructions section of medical record. This document was created in part using AI and STT-dictation technology, any transcriptional errors that may result from this process are unintentional.  Patient: Darren Allen  Service: E/M   PCP: Melvin Pao, NP  DOB: November 30, 1965  DOS: 02/01/2024  Provider: Emmy MARLA Blanch, NP  MRN: [id number]  Delivery: Face-to-face  Specialty: Interventional Pain Management  Type: Established Patient  Setting: Ambulatory outpatient facility  Specialty designation: 09  Referring Prov.: Melvin Pao, NP  Location: Outpatient office facility       History of present illness (HPI) Darren Allen, a 58 y.o. year old male, is here today because of his Shoulder pain (more on biceps area). Darren Allen primary complain today is Shoulder Pain (Bilateral, worse on right side)  Pertinent problems: Darren Allen has Ankle fracture; Neuropathy; Diabetic peripheral neuropathy (HCC); Gout; Chronic ankle pain (Bilateral); Chronic low back pain (1ry area of Pain) (Bilateral) (R>L) w/o sciatica; Other acquired hammer toe; Chronic pain syndrome; Lumbar facet syndrome; Lumbosacral radiculopathy at S1 (Left); Chronic feet pain (2ry area of Pain) (Bilateral); Chronic ankle pain (Left); Abnormal NCS (nerve conduction studies) (02/20/2020); Abnormal MRI, lumbar spine (03/11/2020); Lumbosacral lateral recess stenosis (Left: L5-S1); Chronic low back pain (Bilateral) w/ sciatica (Left); Abnormal MRI, thoracic spine (07/05/2020); Chronic low back pain (Bilateral) w/ sciatica (Right); Chronic lower extremity pain (intermittent) (Right); Chronic lower extremity pain (intermittent) (Left); Lumbar radiculitis (Right); Lumbar radiculitis (Left); Lumbar facet joint pain; Spondylosis without myelopathy or radiculopathy,  lumbosacral region; DDD (degenerative disc disease), lumbosacral w/ LBP; Low back pain of over 3 months duration; Mechanical low back pain; Recurrent low back pain; and Trigger point with back pain (Right) on their pertinent problem list.   Pain Assessment: Severity of Chronic pain is reported as a 7 /10. Location: Shoulder Right, Left/both upper arms. Onset: More than a month ago. Quality: Aching, Burning, Numbness. Timing: Constant. Modifying factor(s): medications. Vitals:  height is 6' 2 (1.88 m) and weight is 235 lb (106.6 kg). His temporal temperature is 99.2 F (37.3 C). His blood pressure is 153/84 (abnormal) and his pulse is 102 (abnormal). His respiration is 16 and oxygen  saturation is 98%.  BMI: Estimated body mass index is 30.17 kg/m as calculated from the following:   Height as of this encounter: 6' 2 (1.88 m).   Weight as of this encounter: 235 lb (106.6 kg).  Last encounter: 11/11/2023. Last procedure: Visit date not found.  Reason for encounter: medication management. The patient reports doing well with current medication regimen. No adverse reaction or side effects reported to medication.   Discussed the use of AI scribe software for clinical note transcription with the patient, who gave verbal consent to proceed.  History of Present Illness   Darren Allen is a 58 year old male who presents with right shoulder pain (more on biceps area). He experiences pain radiating from his right shoulder down to his upper elbow. The pain had previously subsided but has recently intensified. He has not received injections for this shoulder pain, although he has had injections in his back previously. He wishes to receive a shoulder injection before traveling out of town next Tuesday. He reports that an MRI of his right shoulder was performed on January 26, 2024, and that the results were discussed with him last week.  He is currently taking Norco 5-325 mg, which  he obtains from [facility], and reports no side effects or adverse reactions from this medication.     Pharmacotherapy Assessment   Analgesic: Hydrocodone -acetaminophen  (Norco/Vicodin) 5-325 mg tablet 2 times daily as needed for pain. MME=10 Monitoring: [place] PMP: PDMP reviewed during this encounter.       Pharmacotherapy: No side-effects or adverse reactions reported. Compliance: No problems identified. Effectiveness: Clinically acceptable.  Shela Reda CROME, RN  02/01/2024 11:40 AM  Sign when Signing Visit Nursing Pain Medication Assessment:  Safety precautions to be maintained throughout the outpatient stay will include: orient to surroundings, keep bed in low position, maintain call bell within reach at all times, provide assistance with transfer out of bed and ambulation.  Medication Inspection Compliance: Pill count conducted under aseptic conditions, in front of the patient. Neither the pills nor the bottle was removed from the patient's sight at any time. Once count was completed pills were immediately returned to the patient in their original bottle.  Medication: Hydrocodone /APAP Pill/Patch Count:  Pill/Patch Appearance: Markings consistent with prescribed medication Bottle Appearance: Standard pharmacy container. Clearly labeled.50 of 60 pills/patches remain50 Filled Date: 59 / 11 / 2025 Last Medication intake:  TodaySafety precautions to be maintained throughout the outpatient stay will include: orient to surroundings, keep bed in low position, maintain call bell within reach at all times, provide assistance with transfer out of bed and ambulation.     UDS:  Summary  Date Value Ref Range Status  08/11/2023 FINAL  Final    Comment:    ==================================================================== [employer] Select 13 (MW) ==================================================================== Test                             Result       Flag       Units  Drug Present and Declared for  Prescription Verification   Hydrocodone                     417          EXPECTED   ng/mg creat   Norhydrocodone                 434          EXPECTED   ng/mg creat    Sources of hydrocodone  include scheduled prescription medications.    Norhydrocodone is an expected metabolite of hydrocodone .  ==================================================================== Test                      Result    Flag   Units      Ref Range   Creatinine              89               mg/dL      >=79 ==================================================================== Declared Medications:  The flagging and interpretation on this report are based on the  following declared medications.  Unexpected results may arise from  inaccuracies in the declared medications.   **Note: The testing scope of this panel includes these medications:   Hydrocodone    **Note: The testing scope of this panel does not include the  following reported medications:   Acetaminophen   Albuterol  (Proair  HFA)  Albuterol  (Duoneb)  Amitriptyline  (Elavil )  Betamethasone  (Lotrisone )  Budesonide  (Breztri  Aerosphere)  Clotrimazole  (Lotrisone )  Dapagliflozin  (Farxiga )  Fluticasone  (Flonase )  Formoterol  (Breztri  Aerosphere)  Gabapentin  (Neurontin )  Glycopyrrolate  (Breztri  Aerosphere)  Hydrochlorothiazide  (Hydrodiuril )  Icosapent (Vascepa)  Insulin  (Humulin)  Ipratropium (Atrovent )  Ipratropium (Duoneb)  Iron  Lisinopril  (Zestril )  Magnesium  (Mag-Ox)  Metformin  (Glucophage )  Metronidazole  (MetroGel )  Montelukast  (Singulair )  Naloxone  (Narcan )  Pantoprazole  (Protonix )  Rosuvastatin  (Crestor )  Sulfamethoxazole  (Bactrim )  Terbinafine  (Lamisil )  Testosterone   Tirzepatide (Mounjaro)  Topical  Trimethoprim  (Bactrim )  Vitamin B12  Vitamin C ==================================================================== For clinical consultation, please call [phone]. ====================================================================     No results found for: CBDTHCR No results found for: D8THCCBX No results found for: D9THCCBX  ROS  Constitutional: Denies any fever or chills Gastrointestinal: No reported hemesis, hematochezia, vomiting, or acute GI distress Musculoskeletal: Shoulder pain (right) (biceps area) Neurological: No reported episodes of acute onset apraxia, aphasia, dysarthria, agnosia, amnesia, paralysis, loss of coordination, or loss of consciousness  Medication Review  Clindamycin-Benzoyl Per (Refr), EPINEPHrine , HYDROcodone -acetaminophen , Magnesium  Oxide -Mg Supplement, NEEDLE (DISP) 18 G, NEEDLE (DISP) 21 G, PRESCRIPTION MEDICATION, albuterol , amitriptyline , ascorbic acid, aspirin  EC, benralizumab, budesonide -glycopyrrolate -formoterol , cefPROZIL , cyanocobalamin , dapagliflozin  propanediol, ferrous sulfate , fluticasone , gabapentin , hydrochlorothiazide , icosapent Ethyl, insulin  isophane & regular human KwikPen, ipratropium, ipratropium-albuterol , lisinopril , lubiprostone , metFORMIN , montelukast , multivitamin, naloxone , pantoprazole , predniSONE , rosuvastatin , tazarotene , testosterone  cypionate, and tirzepatide  History Review  Allergy: Darren Allen is allergic to dupixent [dupilumab], fasenra [benralizumab], glipizide, ciprofloxacin , cephalexin , and duloxetine. Drug: Darren Allen  reports current drug use. Drug: Hydrocodone . Alcohol:  reports that he does not currently use alcohol after a past usage of about 12.0 standard drinks of alcohol per week. Tobacco:  reports that he has quit smoking. His smoking use included cigarettes. He has a 17 pack-year smoking history. He has been exposed to tobacco smoke. He uses smokeless tobacco. Social: Darren Allen  reports that he has quit smoking. His smoking use included cigarettes. He has a 17 pack-year smoking history. He has been exposed to tobacco smoke. He uses smokeless tobacco. He  reports that he does not currently use alcohol after a past usage of about 12.0 standard drinks of alcohol per week. He reports current drug use. Drug: Hydrocodone . Medical:  has a past medical history of Acute nontraumatic kidney injury (01/21/2022), Allergy, Anxiety, Asthma, Chronic kidney disease, Chronic lower back pain, COPD (chronic obstructive pulmonary disease) (HCC), Coronary artery disease, DDD (degenerative disc disease), lumbosacral, Depression, Diabetic peripheral neuropathy (HCC), Diastolic dysfunction (12/27/2020), Diverticulosis, Emphysema of lung (HCC), Erectile dysfunction, GERD (gastroesophageal reflux disease), Hepatic steatosis, History of kidney stones, Hyperlipidemia, Hypertension, Hypogonadism male, IDA (iron deficiency anemia), Long term current use of opiate analgesic, OSA on CPAP, Osteoporosis, Paraesophageal hernia, PVD (peripheral vascular disease), Sleep apnea, T2DM (type 2 diabetes mellitus) (HCC), Umbilical hernia, and Ventral hernia. Surgical: Darren Allen  has a past surgical history that includes Appendectomy; Incision and drainage perirectal abscess (N/A, 02/04/2015); Rectal exam under anesthesia (02/04/2015); ORIF ankle fracture (Left, 03/23/2017); Syndesmosis repair (Left, 03/23/2017); Colonoscopy with propofol  (N/A, 12/31/2021); Esophagogastroduodenoscopy (N/A, 12/31/2021); Kidney stone surgery (Right); lung mass removal (N/A); Xi robotic assisted paraesophageal hernia repair (N/A, 01/20/2022); Insertion of mesh (01/20/2022); Umbilical hernia repair (01/20/2022); Vasectomy; Circumcision; Bronchoscopy (2016); Muscle biopsy; Ventral hernia repair (N/A, 04/08/2023); Insertion of mesh (N/A, 04/08/2023); Hernia repair (07/2023); LEFT HEART CATH AND CORONARY ANGIOGRAPHY (Left, 08/16/2023); Abdominal hysterectomy; Ventral hernia repair (N/A, 10/13/2023); Excision of mesh (N/A, 10/19/2023); and Ventral hernia repair (N/A, 10/19/2023). Family: family history includes Alcohol abuse in his  father; Cancer in his brother, father, maternal grandfather, mother, and paternal aunt; Colon cancer (age of onset: 39) in his father; Diabetes in his brother; Drug abuse in his daughter; Esophageal cancer (age of onset: 94) in his brother; Heart disease  in his brother and father; Kidney disease in his brother; Lung cancer in his maternal aunt; Lung cancer (age of onset: 7) in his mother.  Laboratory Chemistry Profile   Renal Lab Results  Component Value Date   BUN 16 12/08/2023   CREATININE 1.23 12/08/2023   LABCREA 46 10/18/2023   BCR 13 12/08/2023   GFRAA 59 (L) 12/07/2019   GFRNONAA 33 (L) 10/22/2023    Hepatic Lab Results  Component Value Date   AST 29 12/08/2023   ALT 30 11/02/2023   ALBUMIN  4.7 12/08/2023   ALKPHOS 57 12/08/2023   AMYLASE 93 02/04/2022   LIPASE 48 10/10/2023    Electrolytes Lab Results  Component Value Date   NA 142 12/08/2023   K 4.4 12/08/2023   CL 103 12/08/2023   CALCIUM  9.8 12/08/2023   MG 1.9 12/08/2023   PHOS 3.1 10/19/2023    Bone Lab Results  Component Value Date   25OHVITD1 45 12/08/2023   25OHVITD2 <1.0 12/08/2023   25OHVITD3 45 12/08/2023   TESTOSTERONE  349 09/13/2023    Inflammation (CRP: Acute Phase) (ESR: Chronic Phase) Lab Results  Component Value Date   CRP 3 12/08/2023   ESRSEDRATE 18 12/08/2023   LATICACIDVEN 1.9 10/22/2023         Note: Above Lab results reviewed.  Recent Imaging Review  MR SHOULDER RIGHT WO CONTRAST EXAM: MRI OF THE RIGHT SHOULDER WITHOUT CONTRAST 01/07/2024 10:39:34 AM  TECHNIQUE: Multiplanar multisequence MRI of the right shoulder was performed without the administration of intravenous contrast.  COMPARISON: None available.  CLINICAL HISTORY: Shoulder pain, chronic, bursitis suspected, erosive osteoarthritis suspected, limited range of motion.  FINDINGS:  LIMITATIONS/ARTIFACTS: Motion artifact is present, reducing diagnostic sensitivity and specificity.  ROTATOR CUFF: Moderate  supraspinatus tendinopathy. Intact infraspinatus, subscapularis and teres minor tendons. No significant muscle edema or atrophy.  BICEPS TENDON: Long head biceps tendon is intact and normally located.  LABRUM: The glenoid labrum is intact to the extent that it is visualized. No paralabral cyst.  GLENOHUMERAL JOINT: Mild degenerative glenohumeral chondral thinning. Physiologic amount of joint fluid. Normal alignment.  AC JOINT AND ACROMIOCLAVICULAR ARCH: Mild degenerative AC joint arthropathy with associated spurring. No significant acromial downsloping or subacromial spur. Intact acromioclavicular and coracoclavicular ligaments.  BURSA: Trace subacromial subdeltoid bursitis.  BONE MARROW: No acute fracture or aggressive marrow replacing lesion.  OUTLET SPACES: Normal MRI appearance of the quadrilateral space. No significant narrowing of the supraspinatus outlet.  SOFT TISSUES: No focal abnormality of the subcutaneous soft tissues.  IMPRESSION: 1. Moderate supraspinatus tendinopathy. 2. Mild degenerative acromioclavicular joint arthropathy with spurring and trace subacromial-subdeltoid bursitis. 3. Mild degenerative glenohumeral chondral thinning. 4. Motion artifact limits evaluation.  Electronically signed by: Ryan Salvage MD 01/10/2024 01:40 PM EDT RP Workstation: [id number] MR SHOULDER LEFT WO CONTRAST EXAM: MRI OF THE LEFT SHOULDER WITHOUT CONTRAST 01/07/2024 10:37:39 AM  TECHNIQUE: Multiplanar multisequence MRI of the left shoulder was performed without the administration of intravenous contrast.  COMPARISON: None available.  CLINICAL HISTORY: Chronic left shoulder pain. Limited range of motion. Evaluation for bursitis. 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  FINDINGS:  ROTATOR CUFF: Mild supraspinatus and subscapularis tendinopathy distally. Intact infraspinatus and teres minor tendons. No significant muscle edema or atrophy.  BICEPS TENDON: Mild  tendinopathy of the intraarticular segment of the long head of the biceps. Long head biceps tendon is normally located.  LABRUM: The glenoid labrum is intact to the extent that it is visualized. No paralabral cyst.  GLENOHUMERAL JOINT: Physiologic amount of joint fluid. Moderate degenerative  chondral thinning in the glenohumeral joint as on image 16 series 6. Normal alignment.  AC JOINT AND ACROMIOCLAVICULAR ARCH: Type 2 (curved) subacromial morphology. No significant acromial downsloping or subacromial spur. No significant degenerative changes. Intact acromioclavicular and coracoclavicular ligaments.  BURSA: No significant subacromial/subdeltoid bursitis.  BONE MARROW: No acute fracture or aggressive marrow replacing lesion.  OUTLET SPACES: Normal MRI appearance of the quadrilateral space. No significant narrowing of the supraspinatus outlet.  SOFT TISSUES: No focal abnormality of the subcutaneous soft tissues.  IMPRESSION: 1. Moderate degenerative chondral thinning in the glenohumeral joint. 2. Mild supraspinatus tendinopathy. 3. Mild subscapularis tendinopathy. 4. Mild tendinopathy of the intraarticular segment of the long head of the biceps tendon.  Electronically signed by: Ryan Salvage MD 01/10/2024 01:37 PM EDT RP Workstation: [id number] Note: Reviewed        Physical Exam  Vitals: BP (!) 153/84 (Cuff Size: Large)   Pulse (!) 102   Temp 99.2 F (37.3 C) (Temporal)   Resp 16   Ht 6' 2 (1.88 m)   Wt 235 lb (106.6 kg)   SpO2 98%   BMI 30.17 kg/m  BMI: Estimated body mass index is 30.17 kg/m as calculated from the following:   Height as of this encounter: 6' 2 (1.88 m).   Weight as of this encounter: 235 lb (106.6 kg). Ideal: Ideal body weight: 82.2 kg (181 lb 3.5 oz) Adjusted ideal body weight: 92 kg (202 lb 11.7 oz) General appearance: Well nourished, well developed, and well hydrated. In no apparent acute distress Mental status: Alert, oriented x  3 (person, place, & time)       Respiratory: No evidence of acute respiratory distress Eyes: PERLA  Musculoskeletal: Shoulder pain Assessment   Diagnosis Status  1. Supraspinatus tendinitis, right   2. Encounter for chronic pain management   3. Chronic pain syndrome   4. Pharmacologic therapy   5. Chronic low back pain (1ry area of Pain) (Bilateral) (R>L) w/o sciatica   6. Chronic feet pain (2ry area of Pain) (Bilateral)   7. Degeneration of intervertebral disc of lumbosacral region with discogenic back pain   8. Lumbar facet joint syndrome   9. Chronic use of opiate for therapeutic purpose   10. Encounter for medication management    Persistent Controlled Controlled   Updated Problems: No problems updated.  Plan of Care  Problem-specific:  Assessment and Plan    Right shoulder pain due to supraspinatus tendinopathy Moderate supraspinatus tendinopathy confirmed by MRI. Pain extends to upper elbow. Injection desired before travel. - Rescheduled shoulder injection with Dr. Tanya. - Ensured physical therapy referral is active and will be initiated.  Chronic pain syndrome Managed with Norco 5-325 mg without side effects. - Continue Norco 5-325 mg as prescribed.  Patient's pain is controlled with hydrocodone , will continue on current medication regimen.  Prescribing drug monitoring (PDMP) reviewed, findings consistent with the use of prescribed medication and no evidence of narcotic misuse or abuse.  Urine drug screening (UDS) up to date.  The patient was advised to continue with physical therapy.  No side effects or adverse reaction reported to medication.  Schedule follow-up in 90 days for medication management.  Encounter for chronic pain management: Meds ordered this encounter  Medications   HYDROcodone -acetaminophen  (NORCO/VICODIN) 5-325 MG tablet    Sig: Take 1 tablet by mouth 2 (two) times daily as needed. Must last 30 days.    Dispense:  60 tablet    Refill:  0     DO NOT:  delete (not duplicate); no partial-fill (will deny script to complete), no refill request (F/U required). DISPENSE: 1 day early if closed on fill date. WARN: No CNS-depressants within 8 hrs of med.   HYDROcodone -acetaminophen  (NORCO/VICODIN) 5-325 MG tablet    Sig: Take 1 tablet by mouth 2 (two) times daily as needed. Must last 30 days.    Dispense:  60 tablet    Refill:  0    DO NOT: delete (not duplicate); no partial-fill (will deny script to complete), no refill request (F/U required). DISPENSE: 1 day early if closed on fill date. WARN: No CNS-depressants within 8 hrs of med.   HYDROcodone -acetaminophen  (NORCO/VICODIN) 5-325 MG tablet    Sig: Take 1 tablet by mouth 2 (two) times daily as needed. Must last 30 days.    Dispense:  60 tablet    Refill:  0    DO NOT: delete (not duplicate); no partial-fill (will deny script to complete), no refill request (F/U required). DISPENSE: 1 day early if closed on fill date. WARN: No CNS-depressants within 8 hrs of med.        Darren Allen has a current medication list which includes the following long-term medication(s): ferrous sulfate , fluticasone , gabapentin , hydrochlorothiazide , humulin 70/30 kwikpen, ipratropium, lisinopril , metformin , montelukast , pantoprazole , rosuvastatin , testosterone  cypionate, vascepa, [START ON 02/24/2024] hydrocodone -acetaminophen , [START ON 03/25/2024] hydrocodone -acetaminophen , and [START ON 04/24/2024] hydrocodone -acetaminophen .  Pharmacotherapy (Medications Ordered): Meds ordered this encounter  Medications   HYDROcodone -acetaminophen  (NORCO/VICODIN) 5-325 MG tablet    Sig: Take 1 tablet by mouth 2 (two) times daily as needed. Must last 30 days.    Dispense:  60 tablet    Refill:  0    DO NOT: delete (not duplicate); no partial-fill (will deny script to complete), no refill request (F/U required). DISPENSE: 1 day early if closed on fill date. WARN: No CNS-depressants within 8 hrs of med.    HYDROcodone -acetaminophen  (NORCO/VICODIN) 5-325 MG tablet    Sig: Take 1 tablet by mouth 2 (two) times daily as needed. Must last 30 days.    Dispense:  60 tablet    Refill:  0    DO NOT: delete (not duplicate); no partial-fill (will deny script to complete), no refill request (F/U required). DISPENSE: 1 day early if closed on fill date. WARN: No CNS-depressants within 8 hrs of med.   HYDROcodone -acetaminophen  (NORCO/VICODIN) 5-325 MG tablet    Sig: Take 1 tablet by mouth 2 (two) times daily as needed. Must last 30 days.    Dispense:  60 tablet    Refill:  0    DO NOT: delete (not duplicate); no partial-fill (will deny script to complete), no refill request (F/U required). DISPENSE: 1 day early if closed on fill date. WARN: No CNS-depressants within 8 hrs of med.   Orders:  No orders of the defined types were placed in this encounter.       Return in about 3 months (around 05/03/2024) for (F2F), (MM), Emmy Blanch NP.    Recent Visits Date Type Provider Dept  01/26/24 Office Visit Tanya Glisson, MD [facility]  01/05/24 Office Visit Tanya Glisson, MD [facility]  12/08/23 Office Visit Tanya Glisson, MD [facility]  11/11/23 Office Visit Naziyah Tieszen K, NP [facility]  Showing recent visits within past 90 days and meeting all other requirements Today's Visits Date Type Provider Dept  02/01/24 Office Visit Wayde Gopaul K, NP [facility]  Showing today's visits and meeting all other requirements Future Appointments  Date Type Provider Dept  02/03/24 Appointment Tanya Glisson, MD [facility]  Showing future appointments within next 90 days and meeting all other requirements  I discussed the assessment and treatment plan with the patient. The patient was provided an opportunity to ask questions and all were answered. The patient agreed with the plan and demonstrated an understanding of the  instructions.  Patient advised to call back or seek an in-person evaluation if the symptoms or condition worsens.  I personally spent a total of 30 minutes in the care of the patient today including preparing to see the patient, getting/reviewing separately obtained history, performing a medically appropriate exam/evaluation, counseling and educating, placing orders, referring and communicating with other health care professionals, documenting clinical information in the EHR, independently interpreting results, communicating results, and coordinating care.   Note by: Bianey Tesoro K Glenola Wheat, NP (TTS and AI technology used. I apologize for any typographical errors that were not detected and corrected.) Date: 02/01/2024; Time: 12:48 PM

## 2024-02-01 NOTE — Progress Notes (Signed)
 Nursing Pain Medication Assessment:  Safety precautions to be maintained throughout the outpatient stay will include: orient to surroundings, keep bed in low position, maintain call bell within reach at all times, provide assistance with transfer out of bed and ambulation.  Medication Inspection Compliance: Pill count conducted under aseptic conditions, in front of the patient. Neither the pills nor the bottle was removed from the patient's sight at any time. Once count was completed pills were immediately returned to the patient in their original bottle.  Medication: Hydrocodone /APAP Pill/Patch Count:  Pill/Patch Appearance: Markings consistent with prescribed medication Bottle Appearance: Standard pharmacy container. Clearly labeled.50 of 60 pills/patches remain50 Filled Date: 35 / 11 / 2025 Last Medication intake:  TodaySafety precautions to be maintained throughout the outpatient stay will include: orient to surroundings, keep bed in low position, maintain call bell within reach at all times, provide assistance with transfer out of bed and ambulation.

## 2024-02-03 ENCOUNTER — Ambulatory Visit
Admission: RE | Admit: 2024-02-03 | Discharge: 2024-02-03 | Disposition: A | Discharge status: Home / Self Care | Source: Ambulatory Visit | Attending: Pain Medicine | Admitting: Pain Medicine

## 2024-02-03 ENCOUNTER — Other Ambulatory Visit: Payer: Self-pay | Admitting: Urology

## 2024-02-03 ENCOUNTER — Encounter: Payer: Self-pay | Admitting: Pain Medicine

## 2024-02-03 ENCOUNTER — Encounter: Payer: Self-pay | Admitting: Nurse Practitioner

## 2024-02-03 ENCOUNTER — Ambulatory Visit (HOSPITAL_BASED_OUTPATIENT_CLINIC_OR_DEPARTMENT_OTHER): Admitting: Pain Medicine

## 2024-02-03 VITALS — BP 142/76 | HR 98 | Temp 100.2°F | Resp 15 | Ht 74.0 in | Wt 235.0 lb

## 2024-02-03 DIAGNOSIS — G8929 Other chronic pain: Secondary | ICD-10-CM | POA: Insufficient documentation

## 2024-02-03 DIAGNOSIS — M25512 Pain in left shoulder: Secondary | ICD-10-CM | POA: Insufficient documentation

## 2024-02-03 DIAGNOSIS — M94211 Chondromalacia, right shoulder: Secondary | ICD-10-CM | POA: Insufficient documentation

## 2024-02-03 DIAGNOSIS — M19011 Primary osteoarthritis, right shoulder: Secondary | ICD-10-CM | POA: Diagnosis not present

## 2024-02-03 DIAGNOSIS — M19012 Primary osteoarthritis, left shoulder: Secondary | ICD-10-CM | POA: Diagnosis present

## 2024-02-03 DIAGNOSIS — M94212 Chondromalacia, left shoulder: Secondary | ICD-10-CM | POA: Diagnosis present

## 2024-02-03 DIAGNOSIS — M7590 Shoulder lesion, unspecified, unspecified shoulder: Secondary | ICD-10-CM | POA: Insufficient documentation

## 2024-02-03 DIAGNOSIS — E291 Testicular hypofunction: Secondary | ICD-10-CM

## 2024-02-03 DIAGNOSIS — M25511 Pain in right shoulder: Secondary | ICD-10-CM | POA: Insufficient documentation

## 2024-02-03 MED ORDER — METHYLPREDNISOLONE ACETATE 80 MG/ML IJ SUSP
80.0000 mg | Freq: Once | INTRAMUSCULAR | Status: AC
  Administered 2024-02-03: 80 mg via INTRA_ARTICULAR

## 2024-02-03 MED ORDER — METHYLPREDNISOLONE ACETATE 80 MG/ML IJ SUSP
80.0000 mg | Freq: Once | INTRAMUSCULAR | Status: AC
  Administered 2024-02-03: 80 mg

## 2024-02-03 MED ORDER — PENTAFLUOROPROP-TETRAFLUOROETH EX AERO: INHALATION_SPRAY | Freq: Once | CUTANEOUS | Status: AC

## 2024-02-03 MED ORDER — ROPIVACAINE HCL 2 MG/ML IJ SOLN
9.0000 mL | Freq: Once | INTRAMUSCULAR | Status: AC
  Administered 2024-02-03: 9 mL via INTRA_ARTICULAR
  Filled 2024-02-03: qty 20

## 2024-02-03 MED ORDER — LIDOCAINE HCL 2 % IJ SOLN
20.0000 mL | Freq: Once | INTRAMUSCULAR | Status: AC
  Administered 2024-02-03: 400 mg
  Filled 2024-02-03: qty 20

## 2024-02-03 MED ORDER — ROPIVACAINE HCL 2 MG/ML IJ SOLN
9.0000 mL | Freq: Once | INTRAMUSCULAR | Status: AC
  Administered 2024-02-03: 9 mL via PERINEURAL
  Filled 2024-02-03: qty 20

## 2024-02-03 NOTE — Progress Notes (Signed)
 PROVIDER NOTE: Interpretation of information contained herein should be left to medically-trained personnel. Specific patient instructions are provided elsewhere under Patient Instructions section of medical record. This document was created in part using STT-dictation technology, any transcriptional errors that may result from this process are unintentional.  Patient: Darren Allen Newer Type: Established DOB: September 13, 1965 MRN: [id number] PCP: Melvin Pao, NP  Service: Procedure DOS: 02/03/2024 Setting: Ambulatory Location: Ambulatory outpatient facility Delivery: Face-to-face Provider: Eric Allen Como, MD Specialty: Interventional Pain Management Specialty designation: 09 Location: Outpatient facility Ref. Prov.: Melvin Pao, NP       Interventional Therapy   Procedure: Glenohumeral and acromioclavicular joint Injection #1  Laterality: Bilateral (-50)  Level: Shoulder  Target: Glenohumeral and acromioclavicular joint Location: Intra-articular  Region: Entire Shoulder Area Approach: Anterolateral approach. Type of procedure: Percutaneous joint injection   Imaging: Fluoroscopy-guided Non-spinal ([id number]) Anesthesia: Local anesthesia (1-2% Lidocaine ) Anxiolysis: None                            Sedation: No Sedation                       DOS: 02/03/2024  Performed by: Eric Allen Como, MD  Purpose: Diagnostic/Therapeutic Indications: Shoulder pain severe enough to impact quality of life or function. Rationale (medical necessity): procedure needed and proper for the diagnosis and/or treatment of Mr. Simmering medical symptoms and needs. 1. Chondromalacia of glenohumeral joints (Bilateral)   2. Chronic shoulder pain (Bilateral)   3. Shoulder enthesopathy (Bilateral)   4. Osteoarthritis of shoulders (Bilateral)   5. Chondromalacia of both glenohumeral joints   6. Chronic pain of both shoulders   7. Shoulder enthesopathy   8. Primary osteoarthritis of both shoulders     NAS-11 Pain score:   Pre-procedure: 2 /10   Post-procedure: 2 /10       Position  Prep  Materials:  Position: Supine Prep solution: ChloraPrep (2% chlorhexidine  gluconate and 70% isopropyl alcohol) Prep Area: Entire shoulder Area Materials:  Tray: Block Needle(s):  Type: Spinal  Gauge (G): 22  Length: 3.5-in  Qty: 2  H&P (Pre-op Assessment):  Darren Allen is a 58 y.o. (year old), male patient, seen today for interventional treatment. He  has a past surgical history that includes Appendectomy; Incision and drainage perirectal abscess (N/A, 02/04/2015); Rectal exam under anesthesia (02/04/2015); ORIF ankle fracture (Left, 03/23/2017); Syndesmosis repair (Left, 03/23/2017); Colonoscopy with propofol  (N/A, 12/31/2021); Esophagogastroduodenoscopy (N/A, 12/31/2021); Kidney stone surgery (Right); lung mass removal (N/A); Xi robotic assisted paraesophageal hernia repair (N/A, 01/20/2022); Insertion of mesh (01/20/2022); Umbilical hernia repair (01/20/2022); Vasectomy; Circumcision; Bronchoscopy (2016); Muscle biopsy; Ventral hernia repair (N/A, 04/08/2023); Insertion of mesh (N/A, 04/08/2023); Hernia repair (07/2023); LEFT HEART CATH AND CORONARY ANGIOGRAPHY (Left, 08/16/2023); Abdominal hysterectomy; Ventral hernia repair (N/A, 10/13/2023); Excision of mesh (N/A, 10/19/2023); and Ventral hernia repair (N/A, 10/19/2023). Darren Allen has a current medication list which includes the following prescription(s): amitriptyline , ascorbic acid, aspirin  ec, breztri  aerosphere, cefprozil , clindamycin-benzoyl per (refr), cyanocobalamin , dapagliflozin  propanediol, epinephrine , fasenra pen, ferrous sulfate , fluticasone , gabapentin , hydrochlorothiazide , [START ON 02/24/2024] hydrocodone -acetaminophen , [START ON 03/25/2024] hydrocodone -acetaminophen , [START ON 04/24/2024] hydrocodone -acetaminophen , humulin 70/30 kwikpen, ipratropium, ipratropium-albuterol , lisinopril , lubiprostone , magnesium  oxide -mg supplement, metformin ,  montelukast , mounjaro, multivitamin, naloxone , bd safetyglide needle, bd safetyglide needle, pantoprazole , prednisone , PRESCRIPTION MEDICATION, proair  hfa, rosuvastatin , tazarotene , testosterone  cypionate, and vascepa. His primarily concern today is the Arm Pain (Bilateral, right is worse, upper arm )  Initial Vital Signs:  Pulse/HCG Rate: 98ECG Heart  Rate: 88 Temp: 100.2 F (37.9 C) Resp: 16 BP: (!) 148/82 SpO2: 98 %  BMI: Estimated body mass index is 30.17 kg/m as calculated from the following:   Height as of this encounter: 6' 2 (1.88 m).   Weight as of this encounter: 235 lb (106.6 kg).  Risk Assessment: Allergies: Reviewed. He is allergic to dupixent [dupilumab], fasenra [benralizumab], glipizide, ciprofloxacin , cephalexin , and duloxetine.  Allergy Precautions: None required Coagulopathies: Reviewed. None identified.  Blood-thinner therapy: None at this time Active Infection(s): Reviewed. None identified. Darren Allen is afebrile  Site Confirmation: Darren Allen was asked to confirm the procedure and laterality before marking the site Procedure checklist: Completed Consent: Before the procedure and under the influence of no sedative(s), amnesic(s), or anxiolytics, the patient was informed of the treatment options, risks and possible complications. To fulfill our ethical and legal obligations, as recommended by the American Medical Association's Code of Ethics, I have informed the patient of my clinical impression; the nature and purpose of the treatment or procedure; the risks, benefits, and possible complications of the intervention; the alternatives, including doing nothing; the risk(s) and benefit(s) of the alternative treatment(s) or procedure(s); and the risk(s) and benefit(s) of doing nothing. The patient was provided information about the general risks and possible complications associated with the procedure. These may include, but are not limited to: failure to achieve desired  goals, infection, bleeding, organ or nerve damage, allergic reactions, paralysis, and death. In addition, the patient was informed of those risks and complications associated to the procedure, such as failure to decrease pain; infection; bleeding; organ or nerve damage with subsequent damage to sensory, motor, and/or autonomic systems, resulting in permanent pain, numbness, and/or weakness of one or several areas of the body; allergic reactions; (i.e.: anaphylactic reaction); and/or death. Furthermore, the patient was informed of those risks and complications associated with the medications. These include, but are not limited to: allergic reactions (i.e.: anaphylactic or anaphylactoid reaction(s)); adrenal axis suppression; blood sugar elevation that in diabetics may result in ketoacidosis or comma; water retention that in patients with history of congestive heart failure may result in shortness of breath, pulmonary edema, and decompensation with resultant heart failure; weight gain; swelling or edema; medication-induced neural toxicity; particulate matter embolism and blood vessel occlusion with resultant organ, and/or nervous system infarction; and/or aseptic necrosis of one or more joints. Finally, the patient was informed that Medicine is not an exact science; therefore, there is also the possibility of unforeseen or unpredictable risks and/or possible complications that may result in a catastrophic outcome. The patient indicated having understood very clearly. We have given the patient no guarantees and we have made no promises. Enough time was given to the patient to ask questions, all of which were answered to the patient's satisfaction. Mr. Ndiaye has indicated that he wanted to continue with the procedure. Attestation: I, the ordering provider, attest that I have discussed with the patient the benefits, risks, side-effects, alternatives, likelihood of achieving goals, and potential problems during  recovery for the procedure that I have provided informed consent. Date  Time: 02/03/2024  1:49 PM   Imaging Guidance (Non-Spinal):          Type of Imaging Technique: Fluoroscopy Guidance (Non-Spinal) Indication(s): Fluoroscopy guidance for needle placement to enhance accuracy in procedures requiring precise needle localization for targeted delivery of medication in or near specific anatomical locations not easily accessible without such real-time imaging assistance. Exposure Time: Please see nurses notes. Contrast: None used. Fluoroscopic Guidance: I was  personally present during the use of fluoroscopy. Tunnel Vision Technique used to obtain the best possible view of the target area. Parallax error corrected before commencing the procedure. Direction-depth-direction technique used to introduce the needle under continuous pulsed fluoroscopy. Once target was reached, antero-posterior, oblique, and lateral fluoroscopic projection used confirm needle placement in all planes. Images permanently stored in EMR. Interpretation: No contrast injected. I personally interpreted the imaging intraoperatively. Adequate needle placement confirmed in multiple planes. Permanent images saved into the patient's record.  Pre-Procedure Preparation:  Monitoring: As per clinic protocol. Respiration, ETCO2, SpO2, BP, heart rate and rhythm monitor placed and checked for adequate function Safety Precautions: Patient was assessed for positional comfort and pressure points before starting the procedure. Time-out: I initiated and conducted the Time-out before starting the procedure, as per protocol. The patient was asked to participate by confirming the accuracy of the Time Out information. Verification of the correct person, site, and procedure were performed and confirmed by me, the nursing staff, and the patient. Time-out conducted as per Joint Commission's Universal Protocol (UP.01.01.01). Time: 1426 Start Time:  1426 hrs.  Description  Narrative of Procedure:          Rationale (medical necessity): procedure needed and proper for the diagnosis and/or treatment of the patient's medical symptoms and needs. Procedural Technique Safety Precautions: Aspiration looking for blood return was conducted prior to all injections. At no point did we inject any substances, as a needle was being advanced. No attempts were made at seeking any paresthesias. Safe injection practices and needle disposal techniques used. Medications properly checked for expiration dates. SDV (single dose vial) medications used. Description of the Procedure: Protocol guidelines were followed. The patient was placed in position over the procedure table. The target area was identified and the area prepped in the usual manner. Skin & deeper tissues infiltrated with local anesthetic. Appropriate amount of time allowed to pass for local anesthetics to take effect. The procedure needles were then advanced to the target area. Proper needle placement secured. Negative aspiration confirmed. Solution injected in intermittent fashion, asking for systemic symptoms every 0.5cc of injectate. The needles were then removed and the area cleansed, making sure to leave some of the prepping solution back to take advantage of its long term bactericidal properties.             Vitals:   02/03/24 1340 02/03/24 1425 02/03/24 1435 02/03/24 1445  BP: (!) 148/82 (!) 141/82 (!) 147/78 (!) 142/76  Pulse: 98     Resp: 16 15 16 15   Temp: 100.2 F (37.9 C)     TempSrc: Temporal     SpO2: 98% 99% 99% 99%  Weight: 235 lb (106.6 kg)     Height: 6' 2 (1.88 m)        Start Time: 1426 hrs. End Time: 1438 hrs.  Antibiotic Prophylaxis:   Anti-infectives (From admission, onward)    None      Indication(s): None identified  Post-operative Assessment:  Post-procedure Vital Signs:  Pulse/HCG Rate: 9888 Temp: 100.2 F (37.9 C) Resp: 15 BP: (!) 142/76 SpO2:  99 %  EBL: None  Complications: No immediate post-treatment complications observed by team, or reported by patient.  Note: The patient tolerated the entire procedure well. A repeat set of vitals were taken after the procedure and the patient was kept under observation following institutional policy, for this type of procedure. Post-procedural neurological assessment was performed, showing return to baseline, prior to discharge. The patient was provided with post-procedure  discharge instructions, including a section on how to identify potential problems. Should any problems arise concerning this procedure, the patient was given instructions to immediately contact us , at any time, without hesitation. In any case, we plan to contact the patient by telephone for a follow-up status report regarding this interventional procedure.  Comments:  No additional relevant information.  Plan of Care (POC)  Orders:  Orders Placed This Encounter  Procedures   SHOULDER INJECTION    Scheduling Instructions:     Procedure: Intra-articular shoulder (Glenohumeral) joint and (AC) Acromioclavicular joint injection     Side: Bilateral     Level: Glenohumeral joint and (AC) Acromioclavicular joint     Sedation: Patient's choice.     Date: 02/03/2024    Where will this procedure be performed?:   [facility]   [facility] C-ARM 1-60 MIN NO REPORT    Intraoperative interpretation by procedural physician at [facility].    Standing Status:   Standing    Number of Occurrences:   1    Reason for exam::   Assistance in needle guidance and placement for procedures requiring needle placement in or near specific anatomical locations not easily accessible without such assistance.   Informed Consent Details: Physician/Practitioner Attestation; Transcribe to consent form and obtain patient signature    Note: Always confirm laterality of pain with Mr. Levinson, before procedure.    Physician/Practitioner  attestation of informed consent for procedure/surgical case:   I, the physician/practitioner, attest that I have discussed with the patient the benefits, risks, side effects, alternatives, likelihood of achieving goals and potential problems during recovery for the procedure that I have provided informed consent.    Procedure:   Intra-articular shoulder joint injection under fluoroscopic guidance    Physician/Practitioner performing the procedure:   Nava Song A. Tanya, MD    Indication/Reason:   Chronic shoulder pain secondary to shoulder arthropathy   Provide equipment / supplies at bedside    Procedure tray: Block Tray (Disposable  single use) Skin infiltration needle: Regular 1.5-in, 25-G, (x1) Block Needle type: Spinal Amount/quantity: 2 Size: Regular (3.5-inch) Gauge: 22G    Standing Status:   Standing    Number of Occurrences:   1    Specify:   Block Tray     Analgesic: Hydrocodone /APAP 5/325 tablet, 1 tab p.o. twice daily (#60) MME/day: 10 mg/day    Medications ordered for procedure: Meds ordered this encounter  Medications   lidocaine  (XYLOCAINE ) 2 % (with pres) injection 400 mg   pentafluoroprop-tetrafluoroeth (GEBAUERS) aerosol   methylPREDNISolone  acetate (DEPO-MEDROL ) injection 80 mg   ropivacaine  (PF) 2 mg/mL (0.2%) (NAROPIN ) injection 9 mL   ropivacaine  (PF) 2 mg/mL (0.2%) (NAROPIN ) injection 9 mL   methylPREDNISolone  acetate (DEPO-MEDROL ) injection 80 mg   Medications administered: We administered lidocaine , pentafluoroprop-tetrafluoroeth, methylPREDNISolone  acetate, ropivacaine  (PF) 2 mg/mL (0.2%), ropivacaine  (PF) 2 mg/mL (0.2%), and methylPREDNISolone  acetate.  See the medical record for exact dosing, route, and time of administration.    Interventional Therapies  Risk Factors  Considerations  Medical Comorbidities:  CAD  HTN  Aortic atherosclerosis  PVD  T2IDDM  Hx. Nephrolithiasis  COPD w/ BA  OSA  GERD  Stage 3a CKD     Planned  Pending:    Diagnostic/therapeutic bilateral IA shoulder inj. #1    Under consideration:   Diagnostic/therapeutic bilateral IA shoulder inj. #1  Diagnostic/therapeutic bilateral suprascapular NB #1  Therapeutic right L5-S1 LESI #2  Possible cervical/lumbar spinal cord stimulator trial  Therapeutic left L5-S1  percutaneous discectomy with [employer] Dekompressor system    Completed: (Analgesic benefit)1  Therapeutic right lumbar facet RFA x1 (07/13/2023) (100/100/80/80)  Therapeutic left lumbar facet RFA x1 (06/22/2023) (100/100/75/75) (w/ steroids)  Diagnostic bilateral lumbar facet MBB x1 (03/02/2023) (100/100/95/100) (w/ steroids)  Diagnostic bilateral lumbar facet MBB x1 (03/02/2023) (80-100/80-100/0/0) (w/o steroids)  Diagnostic midline to left caudal ESI x1 (04/02/2022) (100/100/100/LBP:100/LEP:100)  Therapeutic left L5-S1 LESI x3 (08/27/2022) (1st:100/100/100/LBP:85  LEP:100) (2nd: 01[birth year]13/59/LBP:60LLEP:100)  Therapeutic right L5-S1 LESI x1 (12/24/2022) (100/85/100/LBP:100)  Therapeutic bilateral Qutenza  neurolytic treatment x1 (12/23/2021)  (09/08/2021 & 10/29/2021) referral to physical therapy for evaluation and treatment of low back pain. Physical therapy for the lower back ordered.  (06/07/2023-referral entered)  Diagnostic bilateral shoulder MRI ordered (01/05/2024)    Therapeutic  Palliative (PRN) options:   Diagnostic bilateral lumbar facet MBB #2 (without steroids) (PRN)   Completed by other providers:   EMG/PNCV of lower extremity (02/20/2020) by Dr. Jannett Fairly (generalized sensorimotor peripheral neuropathy; superimposed left S1 radiculopathy)  1(Analgesic benefit): Expressed in percentage (%). (Local anesthetic[LA] +/- sedation  L.A.Local Anesthetic  Steroid benefit  Ongoing benefit)    Follow-up plan:   Return in about 2 weeks (around 02/17/2024) for (Face2F), (PPE).     Recent Visits Date Type Provider Dept  02/01/24 Office Visit Patel, Seema K, NP [facility]   01/26/24 Office Visit Tanya Glisson, MD [facility]  01/05/24 Office Visit Tanya Glisson, MD [facility]  12/08/23 Office Visit Tanya Glisson, MD [facility]  11/11/23 Office Visit Patel, Seema K, NP [facility]  Showing recent visits within past 90 days and meeting all other requirements Today's Visits Date Type Provider Dept  02/03/24 Procedure visit Tanya Glisson, MD [facility]  Showing today's visits and meeting all other requirements Future Appointments Date Type Provider Dept  02/28/24 Appointment Tanya Glisson, MD [facility]  Showing future appointments within next 90 days and meeting all other requirements   Disposition: Discharge home  Discharge (Date  Time): 02/03/2024; 1452 hrs.   Primary Care Physician: Melvin Pao, NP Location: [facility] Note by: Glisson Allen Tanya, MD (TTS technology used. I apologize for any typographical errors that were not detected and corrected.) Date: 02/03/2024; Time: 3:30 PM  Disclaimer:  Medicine is not an visual merchandiser. The only guarantee in medicine is that nothing is guaranteed. It is important to note that the decision to proceed with this intervention was based on the information collected from the patient. The Data and conclusions were drawn from the patient's questionnaire, the interview, and the physical examination. Because the information was provided in large part by the patient, it cannot be guaranteed that it has not been purposely or unconsciously manipulated. Every effort has been made to obtain as much relevant data as possible for this evaluation. It is important to note that the conclusions that lead to this procedure are derived in large part from the available data. Always take into account that the treatment will also be dependent on availability of resources and existing treatment guidelines, considered by  other Pain Management Practitioners as being common knowledge and practice, at the time of the intervention. For Medico-Legal purposes, it is also important to point out that variation in procedural techniques and pharmacological choices are the acceptable norm. The indications, contraindications, technique, and results of the above procedure should only be interpreted and judged by a Board-Certified Interventional Pain Specialist with extensive familiarity and expertise in the same exact procedure and  technique.

## 2024-02-03 NOTE — Patient Instructions (Signed)
 ______________________________________________________________________    Post-Procedure Discharge Instructions  INSTRUCTIONS Apply ice:  Purpose: This will minimize any swelling and discomfort after procedure.  When: Day of procedure, as soon as you get home. How: Fill a plastic sandwich bag with crushed ice. Cover it with a small towel and apply to injection site. How long: (15 min on, 15 min off) Apply for 15 minutes then remove x 15 minutes.  Repeat sequence on day of procedure, until you go to bed. Apply heat:  Purpose: To treat any soreness and discomfort from the procedure. When: Starting the next day after the procedure. How: Apply heat to procedure site starting the day following the procedure. How long: May continue to repeat daily, until discomfort goes away. Food intake: Start with clear liquids (like water) and advance to regular food, as tolerated.  Physical activities: Keep activities to a minimum for the first 8 hours after the procedure. After that, then as tolerated. Driving: If you have received any sedation, be responsible and do not drive. You are not allowed to drive for 24 hours after having sedation. Blood thinner: (Applies only to those taking blood thinners) You may restart your blood thinner 6 hours after your procedure. Insulin: (Applies only to Diabetic patients taking insulin) As soon as you can eat, you may resume your normal dosing schedule. Infection prevention: Keep procedure site clean and dry. Shower daily and clean area with soap and water.  PAIN DIARY Post-procedure Pain Diary: Extremely important that this be done correctly and accurately. Recorded information will be used to determine the next step in treatment. For the purpose of accuracy, follow these rules: Evaluate only the area treated. Do not report or include pain from an untreated area. For the purpose of this evaluation, ignore all other areas of pain, except for the treated area. After your  procedure, avoid taking a long nap and attempting to complete the pain diary after you wake up. Instead, set your alarm clock to go off every hour, on the hour, for the initial 8 hours after the procedure. Document the duration of the numbing medicine, and the relief you are getting from it. Do not go to sleep and attempt to complete it later. It will not be accurate. If you received sedation, it is likely that you were given a medication that may cause amnesia. Because of this, completing the diary at a later time may cause the information to be inaccurate. This information is needed to plan your care. Follow-up appointment: Keep your post-procedure follow-up evaluation appointment after the procedure (usually 2 weeks for most procedures, 6 weeks for radiofrequencies). DO NOT FORGET to bring you pain diary with you.   EXPECT... (What should I expect to see with my procedure?) From numbing medicine (AKA: Local Anesthetics): Numbness or decrease in pain. You may also experience some weakness, which if present, could last for the duration of the local anesthetic. Onset: Full effect within 15 minutes of injected. Duration: It will depend on the type of local anesthetic used. On the average, 1 to 8 hours.  From steroids (Applies only if steroids were used): Decrease in swelling or inflammation. Once inflammation is improved, relief of the pain will follow. Onset of benefits: Depends on the amount of swelling present. The more swelling, the longer it will take for the benefits to be seen. In some cases, up to 10 days. Duration: Steroids will stay in the system x 2 weeks. Duration of benefits will depend on multiple posibilities including persistent irritating  factors. Side-effects: If present, they may typically last 2 weeks (the duration of the steroids). Frequent: Cramps (if they occur, drink Gatorade and take over-the-counter Magnesium 450-500 mg once to twice a day); water retention with temporary weight  gain; increases in blood sugar; decreased immune system response; increased appetite. Occasional: Facial flushing (red, warm cheeks); mood swings; menstrual changes. Uncommon: Long-term decrease or suppression of natural hormones; bone thinning. (These are more common with higher doses or more frequent use. This is why we prefer that our patients avoid having any injection therapies in other practices.)  Very Rare: Severe mood changes; psychosis; aseptic necrosis. From procedure: Some discomfort is to be expected once the numbing medicine wears off. This should be minimal if ice and heat are applied as instructed.  CALL IF... (When should I call?) You experience numbness and weakness that gets worse with time, as opposed to wearing off. New onset bowel or bladder incontinence. (Applies only to procedures done in the spine)  Emergency Numbers: Durning business hours (Monday - Thursday, 8:00 AM - 4:00 PM) (Friday, 9:00 AM - 12:00 Noon): [phone] After hours: [phone] NOTE: If you are having a problem and are unable connect with, or to talk to a provider, then go to your nearest urgent care or emergency department. If the problem is serious and urgent, please call 911. ______________________________________________________________________     ______________________________________________________________________    Steroid injections  Common steroids for injections Triamcinolone : Used by many sports medicine physicians for large joint and bursal injections, often combined with a local anesthetic like lidocaine . A study focusing on coccydynia (tailbone pain) found triamcinolone  was more effective than betamethasone, suggesting it may also be preferable for other localized inflammation conditions. Methylprednisolone: A common alternative to triamcinolone  that is also a strong anti-inflammatory. It is available in different formulations, with the acetate suspension being the long-acting  option for intra-articular injections. Dexamethasone : This is a non-particulate steroid, meaning it has a lower risk of tissue damage compared to particulate steroids like triamcinolone  and methylprednisolone. While less common for this specific use, it is an option for targeted injections.   Considerations for physicians Particulate vs. non-particulate steroids: Triamcinolone  and methylprednisolone are particulate, meaning they can clump together. Dexamethasone  is non-particulate. Particulate steroids are often preferred for their longer-lasting effects but carry a theoretical higher risk for certain injections (though this is less of a concern in the costochondral joints). Combined injectate: Corticosteroids are typically mixed with a local anesthetic like lidocaine  to provide both immediate pain relief (from the anesthetic) and longer-term inflammation reduction (from the steroid). Imaging guidance: To ensure accurate placement of the needle and medication, physicians may use ultrasound or fluoroscopic guidance for the injection, especially in complex or refractory cases.   Patient guidance Before undergoing a steroid injection, discuss the options with your physician. They will determine the best steroid, dosage, and procedure for your specific case based on factors like: Severity of your condition History of response to other treatments Your overall health status Experience and preference of the physician  Last  Updated: 11/09/2023 ______________________________________________________________________

## 2024-02-03 NOTE — Progress Notes (Signed)
 Safety precautions to be maintained throughout the outpatient stay will include: orient to surroundings, keep bed in low position, maintain call bell within reach at all times, provide assistance with transfer out of bed and ambulation.

## 2024-02-04 ENCOUNTER — Telehealth: Payer: Self-pay

## 2024-02-04 MED ORDER — LUBIPROSTONE 8 MCG PO CAPS: 8.0000 ug | ORAL_CAPSULE | Freq: Two times a day (BID) | ORAL | 1 refills | Status: AC

## 2024-02-04 NOTE — Telephone Encounter (Signed)
Post procedure follow up.  No answer.  

## 2024-02-14 ENCOUNTER — Ambulatory Visit: Admitting: Dermatology

## 2024-02-17 ENCOUNTER — Encounter: Payer: Self-pay | Admitting: Surgery

## 2024-02-17 ENCOUNTER — Encounter: Admitting: Nurse Practitioner

## 2024-02-27 NOTE — Progress Notes (Unsigned)
 PROVIDER NOTE: Interpretation of information contained herein should be left to medically-trained personnel. Specific patient instructions are provided elsewhere under Patient Instructions section of medical record. This document was created in part using AI and STT-dictation technology, any transcriptional errors that may result from this process are unintentional.  Patient: Darren Allen  Service: E/M   PCP: Melvin Pao, NP  DOB: Dec 30, 1965  DOS: 02/28/2024  Provider: Eric DELENA Como, MD  MRN: [id number]  Delivery: Face-to-face  Specialty: Interventional Pain Management  Type: Established Patient  Setting: Ambulatory outpatient facility  Specialty designation: 09  Referring Prov.: Melvin Pao, NP  Location: Outpatient office facility       History of present illness (HPI) Darren Allen, a 58 y.o. year old male, is here today because of his Chronic pain of both shoulders [M25.511, G89.29, M25.512]. Darren Allen primary complain today is No chief complaint on file.  Pertinent problems: Darren Allen has Ankle fracture; Neuropathy; Diabetic peripheral neuropathy (HCC); Gout; Chronic ankle pain (Bilateral); Chronic low back pain (1ry area of Pain) (Bilateral) (R>L) w/o sciatica; Other acquired hammer toe; Chronic pain syndrome; Lumbar facet syndrome; Lumbosacral radiculopathy at S1 (Left); Chronic feet pain (2ry area of Pain) (Bilateral); Chronic ankle pain (Left); Abnormal NCS (LE) (nerve conduction studies) (02/20/2020); Abnormal MRI, lumbar spine (06/20/2022); Lumbosacral lateral recess stenosis (Left: L5-S1); Chronic low back pain (Bilateral) w/ sciatica (Left); Abnormal MRI, thoracic spine (07/05/2020); Chronic low back pain (Bilateral) w/ sciatica (Right); Chronic lower extremity pain (intermittent) (Right); Chronic lower extremity pain (intermittent) (Left); Lumbar radiculitis (Right); Lumbar radiculitis (Left); Lumbar facet joint pain; Spondylosis without myelopathy or  radiculopathy, lumbosacral region; DDD (degenerative disc disease), lumbosacral w/ LBP; Low back pain of over 3 months duration; Mechanical low back pain; Recurrent low back pain; Trigger point with back pain (Right); Chronic shoulder pain (Bilateral); Upper extremity pain, superior (Left); Upper extremity pain, superior (Right); Impaired range of motion of shoulders (Bilateral); Abnormal MRI, shoulder (Bilateral) (01/07/2024); Shoulder enthesopathy (Bilateral); Shoulder enthesopathy (Left); Shoulder enthesopathy (Right); Supraspinatus tendinopathy (Left); Supraspinatus tendinopathy (Right); Chondromalacia of glenohumeral joints (Bilateral); Subscapularis tendinopathy of shoulder (Left); Biceps tendinopathy (intra-articular) (Left); Subacromial bursitis of shoulder joint (Right); Subdeltoid bursitis (Right); Arthralgia of acromioclavicular joint (Right); Osteoarthritis of AC (acromioclavicular) joint (Right); DJD of acromioclavicular joint (Right); AC (acromioclavicular) joint bone spurs (Right); and Osteoarthritis of shoulders (Bilateral) on their pertinent problem list.  Pain Assessment: Severity of   is reported as a  /10. Location:    / . Onset:  . Quality:  . Timing:  . Modifying factor(s):  SABRA Vitals:  vitals were not taken for this visit.  BMI: Estimated body mass index is 30.17 kg/m as calculated from the following:   Height as of 02/03/24: 6' 2 (1.88 m).   Weight as of 02/03/24: 235 lb (106.6 kg).  Last encounter: 02/03/2024. Last procedure: 02/03/2024.  Reason for encounter: post-procedure evaluation and assessment.   Discussed the use of AI scribe software for clinical note transcription with the patient, who gave verbal consent to proceed.  History of Present Illness          Post-Procedure Evaluation   Procedure: Glenohumeral and acromioclavicular joint Injection #1  Laterality: Bilateral (-50)  Level: Shoulder  Target: Glenohumeral and acromioclavicular joint Location:  Intra-articular  Region: Entire Shoulder Area Approach: Anterolateral approach. Type of procedure: Percutaneous joint injection   Imaging: Fluoroscopy-guided Non-spinal ([id number]) Anesthesia: Local anesthesia (1-2% Lidocaine ) Anxiolysis: None  Sedation: No Sedation                       DOS: 02/03/2024  Performed by: Eric DELENA Como, MD  Purpose: Diagnostic/Therapeutic Indications: Shoulder pain severe enough to impact quality of life or function. Rationale (medical necessity): procedure needed and proper for the diagnosis and/or treatment of Darren Allen medical symptoms and needs. 1. Chondromalacia of glenohumeral joints (Bilateral)   2. Chronic shoulder pain (Bilateral)   3. Shoulder enthesopathy (Bilateral)   4. Osteoarthritis of shoulders (Bilateral)   5. Chondromalacia of both glenohumeral joints   6. Chronic pain of both shoulders   7. Shoulder enthesopathy   8. Primary osteoarthritis of both shoulders    NAS-11 Pain score:   Pre-procedure: 2 /10   Post-procedure: 2 /10       Effectiveness:  Initial hour after procedure:   ***. Subsequent 4-6 hours post-procedure:   ***. Analgesia past initial 6 hours:   ***. Ongoing improvement:  Analgesic:  *** Function:    ***    ROM:    ***    Interpretation: ***  Pharmacotherapy Assessment   Analgesic: Hydrocodone /APAP 5/325 tablet, 1 tab p.o. twice daily (#60) MME/day: 10 mg/day   Monitoring: [place] PMP: PDMP reviewed during this encounter.       Pharmacotherapy: No side-effects or adverse reactions reported. Compliance: No problems identified. Effectiveness: Clinically acceptable.  No notes on file  UDS:  Summary  Date Value Ref Range Status  08/11/2023 FINAL  Final    Comment:    ==================================================================== [employer] [facility] (MW) ==================================================================== Test                             Result        Flag       Units  Drug Present and Declared for Prescription Verification   Hydrocodone                     417          EXPECTED   ng/mg creat   Norhydrocodone                 434          EXPECTED   ng/mg creat    Sources of hydrocodone  include scheduled prescription medications.    Norhydrocodone is an expected metabolite of hydrocodone .  ==================================================================== Test                      Result    Flag   Units      Ref Range   Creatinine              89               mg/dL      >=79 ==================================================================== Declared Medications:  The flagging and interpretation on this report are based on the  following declared medications.  Unexpected results may arise from  inaccuracies in the declared medications.   **Note: The testing scope of this panel includes these medications:   Hydrocodone    **Note: The testing scope of this panel does not include the  following reported medications:   Acetaminophen   Albuterol  (Proair  HFA)  Albuterol  (Duoneb)  Amitriptyline  (Elavil )  Betamethasone  (Lotrisone )  Budesonide  (Breztri  Aerosphere)  Clotrimazole  (Lotrisone )  Dapagliflozin  (Farxiga )  Fluticasone  (Flonase )  Formoterol  (Breztri  Aerosphere)  Gabapentin  (Neurontin )  Glycopyrrolate  (Breztri  Aerosphere)  Hydrochlorothiazide  (  Hydrodiuril )  Icosapent (Vascepa)  Insulin  (Humulin)  Ipratropium (Atrovent )  Ipratropium (Duoneb)  Iron  Lisinopril  (Zestril )  Magnesium  (Mag-Ox)  Metformin  (Glucophage )  Metronidazole  (MetroGel )  Montelukast  (Singulair )  Naloxone  (Narcan )  Pantoprazole  (Protonix )  Rosuvastatin  (Crestor )  Sulfamethoxazole  (Bactrim )  Terbinafine  (Lamisil )  Testosterone   Tirzepatide (Mounjaro)  Topical  Trimethoprim  (Bactrim )  Vitamin B12  Vitamin C ==================================================================== For clinical consultation, please call [phone]. ====================================================================     No results found for: CBDTHCR No results found for: D8THCCBX No results found for: D9THCCBX  ROS  Constitutional: Denies any fever or chills Gastrointestinal: No reported hemesis, hematochezia, vomiting, or acute GI distress Musculoskeletal: Denies any acute onset joint swelling, redness, loss of ROM, or weakness Neurological: No reported episodes of acute onset apraxia, aphasia, dysarthria, agnosia, amnesia, paralysis, loss of coordination, or loss of consciousness  Medication Review  Clindamycin-Benzoyl Per (Refr), EPINEPHrine , HYDROcodone -acetaminophen , Magnesium  Oxide -Mg Supplement, NEEDLE (DISP) 18 G, NEEDLE (DISP) 21 G, PRESCRIPTION MEDICATION, albuterol , amitriptyline , ascorbic acid, aspirin  EC, benralizumab, budesonide -glycopyrrolate -formoterol , cefPROZIL , cyanocobalamin , dapagliflozin  propanediol, ferrous sulfate , fluticasone , gabapentin , hydrochlorothiazide , icosapent Ethyl, insulin  isophane & regular human KwikPen, ipratropium, ipratropium-albuterol , lisinopril , lubiprostone , metFORMIN , montelukast , multivitamin, naloxone , pantoprazole , predniSONE , rosuvastatin , tazarotene , testosterone  cypionate, and tirzepatide  History Review  Allergy: Darren Allen is allergic to dupixent [dupilumab], fasenra [benralizumab], glipizide, ciprofloxacin , cephalexin , and duloxetine. Drug: Darren Allen  reports current drug use. Drug: Hydrocodone . Alcohol:  reports that he does not currently use alcohol after a past usage of about 12.0 standard drinks of alcohol per week. Tobacco:  reports that he has quit smoking. His smoking use included cigarettes. He has a 17 pack-year smoking history. He has been exposed to tobacco smoke. He uses smokeless tobacco. Social: Darren Allen  reports that he has quit smoking. His smoking use included cigarettes. He has a 17 pack-year smoking history. He has been exposed to tobacco smoke.  He uses smokeless tobacco. He reports that he does not currently use alcohol after a past usage of about 12.0 standard drinks of alcohol per week. He reports current drug use. Drug: Hydrocodone . Medical:  has a past medical history of Acute nontraumatic kidney injury (01/21/2022), Allergy, Anxiety, Asthma, Chronic kidney disease, Chronic lower back pain, COPD (chronic obstructive pulmonary disease) (HCC), Coronary artery disease, DDD (degenerative disc disease), lumbosacral, Depression, Diabetic peripheral neuropathy (HCC), Diastolic dysfunction (12/27/2020), Diverticulosis, Emphysema of lung (HCC), Erectile dysfunction, GERD (gastroesophageal reflux disease), Hepatic steatosis, History of kidney stones, Hyperlipidemia, Hypertension, Hypogonadism male, IDA (iron deficiency anemia), Long term current use of opiate analgesic, OSA on CPAP, Osteoporosis, Paraesophageal hernia, PVD (peripheral vascular disease), Sleep apnea, T2DM (type 2 diabetes mellitus) (HCC), Umbilical hernia, and Ventral hernia. Surgical: Darren Allen  has a past surgical history that includes Appendectomy; Incision and drainage perirectal abscess (N/A, 02/04/2015); Rectal exam under anesthesia (02/04/2015); ORIF ankle fracture (Left, 03/23/2017); Syndesmosis repair (Left, 03/23/2017); Colonoscopy with propofol  (N/A, 12/31/2021); Esophagogastroduodenoscopy (N/A, 12/31/2021); Kidney stone surgery (Right); lung mass removal (N/A); Xi robotic assisted paraesophageal hernia repair (N/A, 01/20/2022); Insertion of mesh (01/20/2022); Umbilical hernia repair (01/20/2022); Vasectomy; Circumcision; Bronchoscopy (2016); Muscle biopsy; Hernia repair (07/2023); LEFT HEART CATH AND CORONARY ANGIOGRAPHY (Left, 08/16/2023); Abdominal hysterectomy; Ventral hernia repair (N/A, 10/13/2023); Excision of mesh (N/A, 10/19/2023); Ventral hernia repair (N/A, 10/19/2023); and Ventral hernia repair (N/A, 04/08/2023). Family: family history includes Alcohol abuse in his father;  Cancer in his brother, father, maternal grandfather, mother, and paternal aunt; Colon cancer (age of onset: 75) in his father; Diabetes in his brother; Drug abuse in his daughter; Esophageal cancer (age of  onset: 36) in his brother; Heart disease in his brother and father; Kidney disease in his brother; Lung cancer in his maternal aunt; Lung cancer (age of onset: 85) in his mother.  Laboratory Chemistry Profile   Renal Lab Results  Component Value Date   BUN 16 12/08/2023   CREATININE 1.23 12/08/2023   LABCREA 46 10/18/2023   BCR 13 12/08/2023   GFRAA 59 (L) 12/07/2019   GFRNONAA 33 (L) 10/22/2023    Hepatic Lab Results  Component Value Date   AST 29 12/08/2023   ALT 30 11/02/2023   ALBUMIN  4.7 12/08/2023   ALKPHOS 57 12/08/2023   AMYLASE 93 02/04/2022   LIPASE 48 10/10/2023    Electrolytes Lab Results  Component Value Date   NA 142 12/08/2023   K 4.4 12/08/2023   CL 103 12/08/2023   CALCIUM  9.8 12/08/2023   MG 1.9 12/08/2023   PHOS 3.1 10/19/2023    Bone Lab Results  Component Value Date   25OHVITD1 45 12/08/2023   25OHVITD2 <1.0 12/08/2023   25OHVITD3 45 12/08/2023   TESTOSTERONE  349 09/13/2023    Inflammation (CRP: Acute Phase) (ESR: Chronic Phase) Lab Results  Component Value Date   CRP 3 12/08/2023   ESRSEDRATE 18 12/08/2023   LATICACIDVEN 1.9 10/22/2023         Note: Above Lab results reviewed.  Recent Imaging Review  DG PAIN CLINIC C-ARM 1-60 MIN NO REPORT Fluoro was used, but no Radiologist interpretation will be provided.  Please refer to NOTES tab for provider progress note. Note: Reviewed        Physical Exam  Vitals: There were no vitals taken for this visit. BMI: Estimated body mass index is 30.17 kg/m as calculated from the following:   Height as of 02/03/24: 6' 2 (1.88 m).   Weight as of 02/03/24: 235 lb (106.6 kg). Ideal: Patient weight not recorded General appearance: Well nourished, well developed, and well hydrated. In no apparent  acute distress Mental status: Alert, oriented x 3 (person, place, & time)       Respiratory: No evidence of acute respiratory distress Eyes: PERLA   Assessment   Diagnosis Status  1. Chronic shoulder pain (Bilateral)   2. Shoulder enthesopathy (Bilateral)   3. Chondromalacia of glenohumeral joints (Bilateral)   4. Osteoarthritis of shoulders (Bilateral)   5. Postop check    Controlled Controlled Controlled   Updated Problems: No problems updated.  Plan of Care  Problem-specific:  Assessment and Plan            Darren Allen has a current medication list which includes the following long-term medication(s): ferrous sulfate , fluticasone , gabapentin , hydrochlorothiazide , hydrocodone -acetaminophen , [START ON 03/25/2024] hydrocodone -acetaminophen , [START ON 04/24/2024] hydrocodone -acetaminophen , humulin 70/30 kwikpen, ipratropium, lisinopril , metformin , montelukast , pantoprazole , rosuvastatin , testosterone  cypionate, and vascepa.  Pharmacotherapy (Medications Ordered): No orders of the defined types were placed in this encounter.  Orders:  No orders of the defined types were placed in this encounter.    Interventional Therapies  Risk Factors  Considerations  Medical Comorbidities:  CAD  HTN  Aortic atherosclerosis  PVD  T2IDDM  Hx. Nephrolithiasis  COPD w/ BA  OSA  GERD  Stage 3a CKD     Planned  Pending:   Diagnostic/therapeutic bilateral IA shoulder inj. #1    Under consideration:   Diagnostic/therapeutic bilateral IA shoulder inj. #1  Diagnostic/therapeutic bilateral suprascapular NB #1  Therapeutic right L5-S1 LESI #2  Possible cervical/lumbar spinal cord stimulator trial  Therapeutic left L5-S1 percutaneous discectomy with  [employer] Dekompressor system    Completed: (Analgesic benefit)1  Therapeutic right lumbar facet RFA x1 (07/13/2023) (100/100/80/80)  Therapeutic left lumbar facet RFA x1 (06/22/2023) (100/100/75/75) (w/ steroids)  Diagnostic  bilateral lumbar facet MBB x1 (03/02/2023) (100/100/95/100) (w/ steroids)  Diagnostic bilateral lumbar facet MBB x1 (03/02/2023) (80-100/80-100/0/0) (w/o steroids)  Diagnostic midline to left caudal ESI x1 (04/02/2022) (100/100/100/LBP:100/LEP:100)  Therapeutic left L5-S1 LESI x3 (08/27/2022) (1st:100/100/100/LBP:85  LEP:100) (2nd: 01/13/59/LBP:60LLEP:100)  Therapeutic right L5-S1 LESI x1 (12/24/2022) (100/85/100/LBP:100)  Therapeutic bilateral Qutenza  neurolytic treatment x1 (12/23/2021)  (09/08/2021 & 10/29/2021) referral to physical therapy for evaluation and treatment of low back pain. Physical therapy for the lower back ordered.  (06/07/2023-referral entered)  Diagnostic bilateral shoulder MRI ordered (01/05/2024)    Therapeutic  Palliative (PRN) options:   Diagnostic bilateral lumbar facet MBB #2 (without steroids) (PRN)   Completed by other providers:   EMG/PNCV of lower extremity (02/20/2020) by Dr. Jannett Fairly (generalized sensorimotor peripheral neuropathy; superimposed left S1 radiculopathy)  1(Analgesic benefit): Expressed in percentage (%). (Local anesthetic[LA] +/- sedation  L.A.Local Anesthetic  Steroid benefit  Ongoing benefit)   No follow-ups on file.    Recent Visits Date Type Provider Dept  02/03/24 Procedure visit Tanya Glisson, MD [facility]  02/01/24 Office Visit Patel, Seema K, NP [facility]  01/26/24 Office Visit Tanya Glisson, MD [facility]  01/05/24 Office Visit Tanya Glisson, MD [facility]  12/08/23 Office Visit Tanya Glisson, MD [facility]  Showing recent visits within past 90 days and meeting all other requirements Future Appointments Date Type Provider Dept  02/28/24 Appointment Tanya Glisson, MD [facility]  05/18/24 Appointment Patel, Seema K, NP [facility]  Showing future appointments within next 90 days and meeting all other requirements  I  discussed the assessment and treatment plan with the patient. The patient was provided an opportunity to ask questions and all were answered. The patient agreed with the plan and demonstrated an understanding of the instructions.  Patient advised to call back or seek an in-person evaluation if the symptoms or condition worsens.  Duration of encounter: *** minutes.  Total time on encounter, as per AMA guidelines included both the face-to-face and non-face-to-face time personally spent by the physician and/or other qualified health care professional(s) on the day of the encounter (includes time in activities that require the physician or other qualified health care professional and does not include time in activities normally performed by clinical staff). Physician's time may include the following activities when performed: Preparing to see the patient (e.g., pre-charting review of records, searching for previously ordered imaging, lab work, and nerve conduction tests) Review of prior analgesic pharmacotherapies. Reviewing PMP Interpreting ordered tests (e.g., lab work, imaging, nerve conduction tests) Performing post-procedure evaluations, including interpretation of diagnostic procedures Obtaining and/or reviewing separately obtained history Performing a medically appropriate examination and/or evaluation Counseling and educating the patient/family/caregiver Ordering medications, tests, or procedures Referring and communicating with other health care professionals (when not separately reported) Documenting clinical information in the electronic or other health record Independently interpreting results (not separately reported) and communicating results to the patient/ family/caregiver Care coordination (not separately reported)  Note by: Glisson DELENA Tanya, MD (TTS and AI technology used. I apologize for any typographical errors that were not detected and corrected.) Date: 02/28/2024; Time:  12:54 PM

## 2024-02-28 ENCOUNTER — Ambulatory Visit: Attending: Pain Medicine | Admitting: Pain Medicine

## 2024-02-28 ENCOUNTER — Encounter: Payer: Self-pay | Admitting: Pain Medicine

## 2024-02-28 VITALS — BP 136/77 | HR 97 | Temp 100.9°F | Resp 18 | Ht 74.0 in | Wt 235.0 lb

## 2024-02-28 DIAGNOSIS — M25512 Pain in left shoulder: Secondary | ICD-10-CM | POA: Insufficient documentation

## 2024-02-28 DIAGNOSIS — M19012 Primary osteoarthritis, left shoulder: Secondary | ICD-10-CM | POA: Diagnosis present

## 2024-02-28 DIAGNOSIS — Z09 Encounter for follow-up examination after completed treatment for conditions other than malignant neoplasm: Secondary | ICD-10-CM | POA: Diagnosis not present

## 2024-02-28 DIAGNOSIS — M94212 Chondromalacia, left shoulder: Secondary | ICD-10-CM | POA: Diagnosis present

## 2024-02-28 DIAGNOSIS — R936 Abnormal findings on diagnostic imaging of limbs: Secondary | ICD-10-CM | POA: Diagnosis present

## 2024-02-28 DIAGNOSIS — G8929 Other chronic pain: Secondary | ICD-10-CM | POA: Insufficient documentation

## 2024-02-28 DIAGNOSIS — M19011 Primary osteoarthritis, right shoulder: Secondary | ICD-10-CM | POA: Diagnosis not present

## 2024-02-28 DIAGNOSIS — M7590 Shoulder lesion, unspecified, unspecified shoulder: Secondary | ICD-10-CM | POA: Diagnosis not present

## 2024-02-28 DIAGNOSIS — M94211 Chondromalacia, right shoulder: Secondary | ICD-10-CM | POA: Insufficient documentation

## 2024-02-28 DIAGNOSIS — M25511 Pain in right shoulder: Secondary | ICD-10-CM | POA: Diagnosis present

## 2024-03-13 ENCOUNTER — Ambulatory Visit: Admitting: Nurse Practitioner

## 2024-03-13 ENCOUNTER — Encounter: Payer: Self-pay | Admitting: Nurse Practitioner

## 2024-03-13 VITALS — BP 139/82 | HR 90 | Temp 97.9°F | Ht 74.0 in | Wt 244.0 lb

## 2024-03-13 DIAGNOSIS — Z0000 Encounter for general adult medical examination without abnormal findings: Secondary | ICD-10-CM

## 2024-03-13 DIAGNOSIS — E1159 Type 2 diabetes mellitus with other circulatory complications: Secondary | ICD-10-CM | POA: Diagnosis not present

## 2024-03-13 DIAGNOSIS — I152 Hypertension secondary to endocrine disorders: Secondary | ICD-10-CM

## 2024-03-13 DIAGNOSIS — Z23 Encounter for immunization: Secondary | ICD-10-CM | POA: Diagnosis not present

## 2024-03-13 DIAGNOSIS — E1169 Type 2 diabetes mellitus with other specified complication: Secondary | ICD-10-CM | POA: Diagnosis not present

## 2024-03-13 DIAGNOSIS — J4489 Other specified chronic obstructive pulmonary disease: Secondary | ICD-10-CM | POA: Diagnosis not present

## 2024-03-13 DIAGNOSIS — N1831 Chronic kidney disease, stage 3a: Secondary | ICD-10-CM

## 2024-03-13 DIAGNOSIS — E1142 Type 2 diabetes mellitus with diabetic polyneuropathy: Secondary | ICD-10-CM

## 2024-03-13 DIAGNOSIS — Z794 Long term (current) use of insulin: Secondary | ICD-10-CM | POA: Diagnosis not present

## 2024-03-13 DIAGNOSIS — E1140 Type 2 diabetes mellitus with diabetic neuropathy, unspecified: Secondary | ICD-10-CM | POA: Diagnosis not present

## 2024-03-13 DIAGNOSIS — E785 Hyperlipidemia, unspecified: Secondary | ICD-10-CM

## 2024-03-13 LAB — MICROALBUMIN, URINE WAIVED
Creatinine, Urine Waived: 50 mg/dL (ref 10–300)
Microalb, Ur Waived: 80 mg/L — ABNORMAL HIGH (ref 0–19)

## 2024-03-13 NOTE — Assessment & Plan Note (Signed)
 Chronic.  Controlled.  Using Breztri  maintenance inhalers daily.  Albuterol  is PRN.  Continue with inhalers at home.  Quit smoking in 2024.  Continue to follow up with Pulmonology.  Follow up in 6 months.  Call sooner if concerns arise.   Did recently complete a course of prednisone  for COPD exacerbation.  No wheezing on exam today.

## 2024-03-13 NOTE — Assessment & Plan Note (Signed)
 Chronic.  Controlled.  Continue with current medication regimen.  Microalbumin checked today.  Followed by Dr. Marcelino.  Labs ordered today.  Return to clinic in 6 months for reevaluation.  Call sooner if concerns arise.

## 2024-03-13 NOTE — Assessment & Plan Note (Signed)
 Chronic. Controlled.  Did recently complete a course of prednisone .  Last A1c was 7.1%.  On Insulin  82u am and 42u pm.  Sugars are running 100-180.  He is also on Mounjaro 12.5mg  weekly and Metformin  1000mg  BID.  He is managed by Dr. Damian.   Doing well with Farxiga .  Follow up in 6 months.  Call sooner if concerns arise.

## 2024-03-13 NOTE — Progress Notes (Signed)
 "  BP 139/82   Pulse 90   Temp 97.9 F (36.6 C) (Oral)   Ht 6' 2 (1.88 m)   Wt 244 lb (110.7 kg)   SpO2 98%   BMI 31.33 kg/m    Subjective:    Patient ID: Darren Allen, male    DOB: 02-24-1966, 58 y.o.   MRN: [id number]  HPI: Darren Allen is a 58 y.o. male presenting on 03/13/2024 for comprehensive medical examination. Current medical complaints include:none  He currently lives with: Interim Problems from his last visit: no  HYPERTENSION / HYPERLIPIDEMIA Satisfied with current treatment? no Duration of hypertension: years BP monitoring frequency: sometimes BP range: 120/76-86 BP medication side effects: no Past BP meds: Lisinopril  Duration of hyperlipidemia: years Cholesterol medication side effects: no Cholesterol supplements: none Past cholesterol medications: rosuvastatin  (crestor ) Medication compliance: excellent compliance Aspirin : no Recent stressors: no Recurrent headaches: no Visual changes: no Palpitations: no Dyspnea: yes Chest pain: no Lower extremity edema: no Dizzy/lightheaded: no  DIABETES Followed by Dr. Damian.  A1c was 7.1% in August.  He is on Mounjaro 12.5mg  weekly. Metformin  1000mg  BID. Using insulin  82 am 42u pm.   Hypoglycemic episodes:no Polydipsia/polyuria: no Visual disturbance: no Chest pain: no Paresthesias: no Glucose Monitoring: yes  Accucheck frequency: continuous  Fasting glucose: 100-180  Post prandial:   Evening:  Before meals: Taking Insulin ?: yes  Long acting insulin : 82u am and 42 u in the evening  Short acting insulin :  Blood Pressure Monitoring: daily  Retinal Examination: up to date Foot Exam: Up to Date Diabetic Education: Not Completed Pneumovax: Up to Date Influenza: Not up to Date Aspirin : no  COPD Breathing has been good.  He feels like the Breztri  really helps him. Quit smoking in September 2024. He recently completed a course of prednisone  with Pulm. COPD status:controlled Satisfied with current  treatment?: no Oxygen  use: no Dyspnea frequency: yes Cough frequency: yes Rescue inhaler frequency: 1-2/x per day Limitation of activity: yes Productive cough: yes Last Spirometry:  Pneumovax: Up to Date Influenza: Not up to Date   Patient is currently being treated for abdominal wall abscess that has resolved.    Depression Screen done today and results listed below:     03/13/2024    9:59 AM 02/28/2024   11:48 AM 02/01/2024   11:38 AM 01/05/2024   12:45 PM 11/11/2023   10:26 AM  Depression screen PHQ 2/9  Decreased Interest 1 0 0 0 0  Down, Depressed, Hopeless 0 0 0 0 0  PHQ - 2 Score 1 0 0 0 0  Altered sleeping 0      Tired, decreased energy 1      Change in appetite 0      Feeling bad or failure about yourself  0      Trouble concentrating 0      Moving slowly or fidgety/restless 0      Suicidal thoughts 0      PHQ-9 Score 2      Difficult doing work/chores Not difficult at all        The patient does not have a history of falls. I did complete a risk assessment for falls. A plan of care for falls was documented.   Past Medical History:  Past Medical History:  Diagnosis Date   Acute nontraumatic kidney injury 01/21/2022   Allergy    Anxiety    Asthma    Chronic kidney disease    Chronic lower back pain    a.)  followed by pain management; on COT   COPD (chronic obstructive pulmonary disease) (HCC)    Coronary artery disease    a.) cCTA 06/30/2021: Ca2+ = 216 (84th %ile; 25049% pRCA and pLAD))   DDD (degenerative disc disease), lumbosacral    Depression    Diabetic peripheral neuropathy (HCC)    Diastolic dysfunction 12/27/2020   a.) TTE 12/27/2020: EF >55%, no RWMAs, G1DD, norm RVSF, triv MR/TR?PR   Diverticulosis    Emphysema of lung (HCC)    Erectile dysfunction    a.) on PDE5i (tadalafil )   GERD (gastroesophageal reflux disease)    Hepatic steatosis    History of kidney stones    Hyperlipidemia    Hypertension    Hypogonadism male    a.) on  exogenous TRT (depotestosterone cypionate)   IDA (iron deficiency anemia)    Long term current use of opiate analgesic    a.) followed by pain management; naloxone  Rx available   OSA on CPAP    Osteoporosis    Paraesophageal hernia    a.) s/p robotic assisted repair 01/2022   PVD (peripheral vascular disease)    Sleep apnea    T2DM (type 2 diabetes mellitus) (HCC)    Umbilical hernia    a.) s/p repair 01/20/2022   Ventral hernia     Surgical History:  Past Surgical History:  Procedure Laterality Date   ABDOMINAL HYSTERECTOMY     APPENDECTOMY     BRONCHOSCOPY  2016   CIRCUMCISION     COLONOSCOPY WITH PROPOFOL  N/A 12/31/2021   Procedure: COLONOSCOPY WITH PROPOFOL ;  Surgeon: Therisa Bi, MD;  Location: [facility];  Service: Gastroenterology;  Laterality: N/A;   ESOPHAGOGASTRODUODENOSCOPY N/A 12/31/2021   Procedure: ESOPHAGOGASTRODUODENOSCOPY (EGD);  Surgeon: Therisa Bi, MD;  Location: [facility] ENDOSCOPY;  Service: Gastroenterology;  Laterality: N/A;   EXCISION OF MESH N/A 10/19/2023   Procedure: REMOVAL, MESH, ABDOMEN OR PELVIS;  Surgeon: Jordis Laneta FALCON, MD;  Location: [facility] ORS;  Service: General;  Laterality: N/A;   HERNIA REPAIR  07/2023   INCISION AND DRAINAGE PERIRECTAL ABSCESS N/A 02/04/2015   Procedure: IRRIGATION AND DEBRIDEMENT PERIRECTAL ABSCESS;  Surgeon: Lonni Brands, MD;  Location: [facility] ORS;  Service: General;  Laterality: N/A;   INSERTION OF MESH  01/20/2022   Procedure: INSERTION OF MESH;  Surgeon: Jordis Laneta FALCON, MD;  Location: [facility] ORS;  Service: General;;   KIDNEY STONE SURGERY Right    LEFT HEART CATH AND CORONARY ANGIOGRAPHY Left 08/16/2023   Procedure: LEFT HEART CATH AND CORONARY ANGIOGRAPHY;  Surgeon: Florencio Cara BIRCH, MD;  Location: [facility] INVASIVE CV LAB;  Service: Cardiovascular;  Laterality: Left;   lung mass removal N/A    MUSCLE BIOPSY     ORIF ANKLE FRACTURE Left 03/23/2017   Procedure: OPEN REDUCTION INTERNAL FIXATION (ORIF) ANKLE FRACTURE;   Surgeon: Tobie Priest, MD;  Location: [facility] ORS;  Service: Orthopedics;  Laterality: Left;   RECTAL EXAM UNDER ANESTHESIA  02/04/2015   Procedure: RECTAL EXAM UNDER ANESTHESIA;  Surgeon: Lonni Brands, MD;  Location: [facility] ORS;  Service: General;;   SYNDESMOSIS REPAIR Left 03/23/2017   Procedure: SYNDESMOSIS REPAIR;  Surgeon: Tobie Priest, MD;  Location: [facility] ORS;  Service: Orthopedics;  Laterality: Left;   UMBILICAL HERNIA REPAIR  01/20/2022   Procedure: HERNIA REPAIR UMBILICAL ADULT;  Surgeon: Jordis Laneta FALCON, MD;  Location: [facility] ORS;  Service: General;;   VASECTOMY     VENTRAL HERNIA REPAIR N/A 10/13/2023   Procedure: EXCISIONAL DEBRIDEMENT OF SKIN AND FASCIA;  Surgeon: Jordis Laneta  F, MD;  Location: [facility] ORS;  Service: General;  Laterality: N/A;  open procedure w/mesh   VENTRAL HERNIA REPAIR N/A 10/19/2023   Procedure: REPAIR, HERNIA, VENTRAL;  Surgeon: Jordis Laneta FALCON, MD;  Location: [facility] ORS;  Service: General;  Laterality: N/A;   VENTRAL HERNIA REPAIR N/A 04/08/2023   Procedure: HERNIA REPAIR VENTRAL ADULT, open;  Surgeon: Jordis Laneta FALCON, MD;  Location: [facility] ORS;  Service: General;  Laterality: N/A;   XI ROBOTIC ASSISTED PARAESOPHAGEAL HERNIA REPAIR N/A 01/20/2022   Procedure: XI ROBOTIC ASSISTED PARAESOPHAGEAL HERNIA REPAIR, CONVERTED TO OPEN, RNFA to assist;  Surgeon: Jordis Laneta FALCON, MD;  Location: [facility] ORS;  Service: General;  Laterality: N/A;    Medications:  Current Outpatient Medications on File Prior to Visit  Medication Sig   amitriptyline  (ELAVIL ) 10 MG tablet Take 30 mg by mouth at bedtime.   ascorbic acid (VITAMIN C) 1000 MG tablet Take 1,000 mg by mouth daily.   aspirin  EC 81 MG tablet Take 81 mg by mouth daily. Swallow whole.   Budeson-Glycopyrrol-Formoterol  (BREZTRI  AEROSPHERE) 160-9-4.8 MCG/ACT AERO Inhale 2 puffs into the lungs in the morning and at bedtime.   Clindamycin-Benzoyl Per, Refr, gel Apply 1 Application topically every morning.   cyanocobalamin  (VITAMIN  B12) 1000 MCG tablet Take 1,000 mcg by mouth daily.   dapagliflozin  propanediol (FARXIGA ) 10 MG TABS tablet Take 1 tablet (10 mg total) by mouth daily before breakfast.   EPINEPHrine  0.3 mg/0.3 mL IJ SOAJ injection 0.3 mg as needed for anaphylaxis.   ferrous sulfate  325 (65 FE) MG tablet Take 325 mg by mouth daily with breakfast.   fluticasone  (FLONASE ) 50 MCG/ACT nasal spray Place 1 spray into both nostrils daily as needed for allergies.   gabapentin  (NEURONTIN ) 600 MG tablet Take 1 tablet (600 mg total) by mouth 4 (four) times daily.   hydrochlorothiazide  (HYDRODIURIL ) 25 MG tablet Take 1 tablet (25 mg total) by mouth daily.   HYDROcodone -acetaminophen  (NORCO/VICODIN) 5-325 MG tablet Take 1 tablet by mouth 2 (two) times daily as needed. Must last 30 days.   [START ON 03/25/2024] HYDROcodone -acetaminophen  (NORCO/VICODIN) 5-325 MG tablet Take 1 tablet by mouth 2 (two) times daily as needed. Must last 30 days.   [START ON 04/24/2024] HYDROcodone -acetaminophen  (NORCO/VICODIN) 5-325 MG tablet Take 1 tablet by mouth 2 (two) times daily as needed. Must last 30 days.   insulin  isophane & regular human (HUMULIN 70/30 KWIKPEN) (70-30) 100 UNIT/ML KwikPen Inject 42-82 Units into the skin 2 (two) times daily with a meal. 82 in the a.m. and 42u qhs   ipratropium (ATROVENT ) 0.06 % nasal spray Place 2 sprays into both nostrils 4 (four) times daily.   ipratropium-albuterol  (DUONEB) 0.5-2.5 (3) MG/3ML SOLN Take 3 mLs by nebulization every 4 (four) hours as needed (SOB/Wheezing/Asthma).   lisinopril  (ZESTRIL ) 10 MG tablet Take 1 tablet (10 mg total) by mouth daily.   lubiprostone  (AMITIZA ) 8 MCG capsule Take 1 capsule (8 mcg total) by mouth 2 (two) times daily with a meal.   Magnesium  Oxide -Mg Supplement 500 MG TABS Take 500 mg by mouth daily.   metFORMIN  (GLUCOPHAGE -XR) 500 MG 24 hr tablet Take 2 tablets (1,000 mg total) by mouth 2 (two) times daily with a meal.   montelukast  (SINGULAIR ) 10 MG tablet Take 10 mg by  mouth daily as needed (Allergies).   MOUNJARO 12.5 MG/0.5ML Pen SMARTSIG:0.5 Milliliter(s) SUB-Q Once a Week   Multiple Vitamin (MULTIVITAMIN) tablet Take 1 tablet by mouth daily. Mega Men   naloxone  (NARCAN ) nasal spray  4 mg/0.1 mL Place 1 spray into the nose as needed for up to 365 doses (for opioid-induced respiratory depresssion). In case of emergency (overdose), spray once into each nostril. If no response within 3 minutes, repeat application and call 911.   NEEDLE, DISP, 18 G (BD SAFETYGLIDE NEEDLE) 18G X 1-1/2 MISC 1 mg by Does not apply route every 7 (seven) days.   NEEDLE, DISP, 21 G (BD SAFETYGLIDE NEEDLE) 21G X 1 MISC 1 mg by Does not apply route every 7 (seven) days.   pantoprazole  (PROTONIX ) 40 MG tablet Take 1 tablet (40 mg total) by mouth 2 (two) times daily.   PRESCRIPTION MEDICATION Pt has CPAP Machine   PROAIR  HFA 108 (90 Base) MCG/ACT inhaler Inhale 2 puffs into the lungs every 6 (six) hours as needed for wheezing or shortness of breath.   rosuvastatin  (CRESTOR ) 40 MG tablet TAKE 1 TABLET(40 MG) BY MOUTH DAILY (Patient taking differently: Take 40 mg by mouth at bedtime.)   tazarotene  (AVAGE ) 0.1 % cream Apply topically at bedtime. qhs to face and chest for acne   testosterone  cypionate (DEPOTESTOSTERONE CYPIONATE) 200 MG/ML injection Inject 1 cc every 10 days.   triamcinolone  cream (KENALOG ) 0.1 % Apply topically 2 (two) times daily.   VASCEPA 1 g capsule Take 2 g by mouth 2 (two) times daily.   No current facility-administered medications on file prior to visit.    Allergies:  Allergies[1]  Social History:  Social History   Socioeconomic History   Marital status: Single    Spouse name: Not on file   Number of children: Not on file   Years of education: Not on file   Highest education level: 8th grade  Occupational History   Not on file  Tobacco Use   Smoking status: Former    Current packs/day: 0.50    Average packs/day: 0.5 packs/day for 34.0 years (17.0 ttl  pk-yrs)    Types: Cigarettes    Passive exposure: Past   Smokeless tobacco: Current  Vaping Use   Vaping status: Some Days   Substances: Nicotine   Substance and Sexual Activity   Alcohol use: Not Currently    Alcohol/week: 12.0 standard drinks of alcohol    Types: 12 Cans of beer per week    Comment: Quit February 2023   Drug use: Yes    Types: Hydrocodone    Sexual activity: Yes  Other Topics Concern   Not on file  Social History Narrative   Not on file   Social Drivers of Health   Tobacco Use: High Risk (03/13/2024)   Patient History    Smoking Tobacco Use: Former    Smokeless Tobacco Use: Current    Passive Exposure: Past  Physicist, Medical Strain: Low Risk (06/07/2023)   Overall Financial Resource Strain (CARDIA)    Difficulty of Paying Living Expenses: Not hard at all  Food Insecurity: No Food Insecurity (10/17/2023)   Epic    Worried About Radiation Protection Practitioner of Food in the Last Year: Never true    Ran Out of Food in the Last Year: Never true  Transportation Needs: No Transportation Needs (10/17/2023)   Epic    Lack of Transportation (Medical): No    Lack of Transportation (Non-Medical): No  Physical Activity: Inactive (06/07/2023)   Exercise Vital Sign    Days of Exercise per Week: 0 days    Minutes of Exercise per Session: 0 min  Stress: No Stress Concern Present (06/07/2023)   [employer] of Occupational Health - Occupational Stress  Questionnaire    Feeling of Stress : Only a little  Social Connections: Moderately Isolated (07/28/2023)   Social Connection and Isolation Panel    Frequency of Communication with Friends and Family: Three times a week    Frequency of Social Gatherings with Friends and Family: Twice a week    Attends Religious Services: Never    Database Administrator or Organizations: No    Attends Banker Meetings: Never    Marital Status: Living with partner  Intimate Partner Violence: Not At Risk (10/17/2023)   Epic    Fear of Current  or Ex-Partner: No    Emotionally Abused: No    Physically Abused: No    Sexually Abused: No  Depression (PHQ2-9): Low Risk (03/13/2024)   Depression (PHQ2-9)    PHQ-2 Score: 2  Alcohol Screen: Low Risk (06/07/2023)   Alcohol Screen    Last Alcohol Screening Score (AUDIT): 0  Housing: Low Risk (10/17/2023)   Epic    Unable to Pay for Housing in the Last Year: No    Number of Times Moved in the Last Year: 0    Homeless in the Last Year: No  Utilities: Not At Risk (10/17/2023)   Epic    Threatened with loss of utilities: No  Health Literacy: Adequate Health Literacy (06/07/2023)   B1300 Health Literacy    Frequency of need for help with medical instructions: Never   Tobacco Use History[2] Social History   Substance and Sexual Activity  Alcohol Use Not Currently   Alcohol/week: 12.0 standard drinks of alcohol   Types: 12 Cans of beer per week   Comment: Quit February 2023    Family History:  Family History  Problem Relation Age of Onset   Lung cancer Mother 27   Cancer Mother    Colon cancer Father 44   Heart disease Father    Alcohol abuse Father    Cancer Father    Esophageal cancer Brother 36   Diabetes Brother    Cancer Brother    Kidney disease Brother    Heart disease Brother    Lung cancer Maternal Aunt    Cancer Paternal Aunt        unk type   Cancer Maternal Grandfather        unk type   Drug abuse Daughter     Past medical history, surgical history, medications, allergies, family history and social history reviewed with patient today and changes made to appropriate areas of the chart.   Review of Systems  HENT:         Denies vision changes.  Eyes:  Negative for blurred vision and double vision.  Respiratory:  Negative for shortness of breath.   Cardiovascular:  Negative for chest pain, palpitations and leg swelling.  Neurological:  Negative for dizziness, tingling and headaches.  Endo/Heme/Allergies:  Negative for polydipsia.       Denies Polyuria    All other ROS negative except what is listed above and in the HPI.      Objective:    BP 139/82   Pulse 90   Temp 97.9 F (36.6 C) (Oral)   Ht 6' 2 (1.88 m)   Wt 244 lb (110.7 kg)   SpO2 98%   BMI 31.33 kg/m   Wt Readings from Last 3 Encounters:  03/13/24 244 lb (110.7 kg)  02/28/24 235 lb (106.6 kg)  02/03/24 235 lb (106.6 kg)    Physical Exam Vitals and nursing note reviewed.  Constitutional:      General: He is not in acute distress.    Appearance: Normal appearance. He is not ill-appearing, toxic-appearing or diaphoretic.  HENT:     Head: Normocephalic.     Right Ear: External ear normal.     Left Ear: External ear normal.     Nose: Nose normal. No congestion or rhinorrhea.     Mouth/Throat:     Mouth: Mucous membranes are moist.  Eyes:     General:        Right eye: No discharge.        Left eye: No discharge.     Extraocular Movements: Extraocular movements intact.     Conjunctiva/sclera: Conjunctivae normal.     Pupils: Pupils are equal, round, and reactive to light.  Cardiovascular:     Rate and Rhythm: Normal rate and regular rhythm.     Heart sounds: No murmur heard. Pulmonary:     Effort: Pulmonary effort is normal. No respiratory distress.     Breath sounds: Normal breath sounds. No wheezing, rhonchi or rales.  Abdominal:     General: Abdomen is flat. Bowel sounds are normal.  Musculoskeletal:     Cervical back: Normal range of motion and neck supple.  Skin:    General: Skin is warm and dry.     Capillary Refill: Capillary refill takes less than 2 seconds.  Neurological:     General: No focal deficit present.     Mental Status: He is alert and oriented to person, place, and time.  Psychiatric:        Mood and Affect: Mood normal.        Behavior: Behavior normal.        Thought Content: Thought content normal.        Judgment: Judgment normal.     Results for orders placed or performed in visit on 12/08/23  Comp. Metabolic Panel (12)    Collection Time: 12/08/23 12:21 PM  Result Value Ref Range   Glucose 111 (H) 70 - 99 mg/dL   BUN 16 8 - 27 mg/dL   Creatinine, Ser 8.76 0.76 - 1.27 mg/dL   eGFR 67 >40 fO/fpw/8.26   BUN/Creatinine Ratio 13 10 - 24   Sodium 142 134 - 144 mmol/L   Potassium 4.4 3.5 - 5.2 mmol/L   Chloride 103 96 - 106 mmol/L   Calcium  9.8 8.6 - 10.2 mg/dL   Total Protein 7.3 6.0 - 8.5 g/dL   Albumin  4.7 3.8 - 4.9 g/dL   Globulin, Total 2.6 1.5 - 4.5 g/dL   Bilirubin Total 0.5 0.0 - 1.2 mg/dL   Alkaline Phosphatase 57 47 - 123 IU/L   AST 29 0 - 40 IU/L  Magnesium    Collection Time: 12/08/23 12:21 PM  Result Value Ref Range   Magnesium  1.9 1.6 - 2.3 mg/dL  Vitamin B12   Collection Time: 12/08/23 12:21 PM  Result Value Ref Range   Vitamin B-12 867 232 - 1,245 pg/mL  Sedimentation rate   Collection Time: 12/08/23 12:21 PM  Result Value Ref Range   Sed Rate 18 0 - 30 mm/hr  25-Hydroxy vitamin D  Lcms D2+D3   Collection Time: 12/08/23 12:21 PM  Result Value Ref Range   25-Hydroxy, Vitamin D  45 ng/mL   25-Hydroxy, Vitamin D -2 <1.0 ng/mL   25-Hydroxy, Vitamin D -3 45 ng/mL  C-reactive protein   Collection Time: 12/08/23 12:21 PM  Result Value Ref Range   CRP 3 0 - 10 mg/L  Assessment & Plan:   Problem List Items Addressed This Visit       Cardiovascular and Mediastinum   Hypertension associated with diabetes (HCC)   Relevant Medications   MOUNJARO 12.5 MG/0.5ML Pen     Respiratory   COPD with asthma (HCC)   Chronic.  Controlled.  Using Breztri  maintenance inhalers daily.  Albuterol  is PRN.  Continue with inhalers at home.  Quit smoking in 2024.  Continue to follow up with Pulmonology.  Follow up in 6 months.  Call sooner if concerns arise.   Did recently complete a course of prednisone  for COPD exacerbation.  No wheezing on exam today.        Endocrine   Diabetic peripheral neuropathy (HCC) (Chronic)   Chronic.  Controlled.  Continue with current medication regimen of Gabapentin   600mg  QID.  Labs ordered today.  Return to clinic in 6 months for reevaluation.  Call sooner if concerns arise.        Relevant Medications   MOUNJARO 12.5 MG/0.5ML Pen   Type 2 diabetes mellitus with diabetic neuropathy, with long-term current use of insulin  (HCC) (Chronic)   Chronic. Controlled.  Did recently complete a course of prednisone .  Last A1c was 7.1%.  On Insulin  82u am and 42u pm.  Sugars are running 100-180.  He is also on Mounjaro 12.5mg  weekly and Metformin  1000mg  BID.  He is managed by Dr. Damian.   Doing well with Farxiga .  Follow up in 6 months.  Call sooner if concerns arise.       Relevant Medications   MOUNJARO 12.5 MG/0.5ML Pen   Other Relevant Orders   Hemoglobin A1c   Microalbumin, Urine Waived   Hyperlipidemia associated with type 2 diabetes mellitus (HCC)   Relevant Medications   MOUNJARO 12.5 MG/0.5ML Pen   Other Relevant Orders   Lipid panel     Genitourinary   Stage 3a chronic kidney disease (HCC)   Chronic.  Controlled.  Continue with current medication regimen.  Microalbumin checked today.  Followed by Dr. Marcelino.  Labs ordered today.  Return to clinic in 6 months for reevaluation.  Call sooner if concerns arise.        Other Visit Diagnoses       Annual physical exam    -  Primary   Health maintenance reviewed during visit today. Labs ordered.  Vaccines reviewed. Colonoscopy up to date.   Relevant Orders   TSH   PSA   Lipid panel   CBC with Differential/Platelet   Comprehensive metabolic panel with GFR   Hemoglobin A1c   Microalbumin, Urine Waived     Need for shingles vaccine       Relevant Orders   Varicella-zoster vaccine IM        Discussed aspirin  prophylaxis for myocardial infarction prevention and decision was made to continue ASA  LABORATORY TESTING:  Health maintenance labs ordered today as discussed above.   The natural history of prostate cancer and ongoing controversy regarding screening and potential treatment outcomes  of prostate cancer has been discussed with the patient. The meaning of a false positive PSA and a false negative PSA has been discussed. He indicates understanding of the limitations of this screening test and wishes to proceed with screening PSA testing.   IMMUNIZATIONS:   - Tdap: Tetanus vaccination status reviewed: last tetanus booster within 10 years. - Influenza: Up to date - Pneumovax: Up to date - Prevnar: Up to date - COVID: Up to date - HPV: Not  applicable - Shingrix vaccine: Up to date  SCREENING: - Colonoscopy: Up to date  Discussed with patient purpose of the colonoscopy is to detect colon cancer at curable precancerous or early stages   - AAA Screening: Not applicable  -Hearing Test: Not applicable  -Spirometry: Not applicable   PATIENT COUNSELING:    Sexuality: Discussed sexually transmitted diseases, partner selection, use of condoms, avoidance of unintended pregnancy  and contraceptive alternatives.   Advised to avoid cigarette smoking.  I discussed with the patient that most people either abstain from alcohol or drink within safe limits (<=14/week and <=4 drinks/occasion for males, <=7/weeks and <= 3 drinks/occasion for females) and that the risk for alcohol disorders and other health effects rises proportionally with the number of drinks per week and how often a drinker exceeds daily limits.  Discussed cessation/primary prevention of drug use and availability of treatment for abuse.   Diet: Encouraged to adjust caloric intake to maintain  or achieve ideal body weight, to reduce intake of dietary saturated fat and total fat, to limit sodium intake by avoiding high sodium foods and not adding table salt, and to maintain adequate dietary potassium and calcium  preferably from fresh fruits, vegetables, and low-fat dairy products.    stressed the importance of regular exercise  Injury prevention: Discussed safety belts, safety helmets, smoke detector, smoking near  bedding or upholstery.   Dental health: Discussed importance of regular tooth brushing, flossing, and dental visits.   Follow up plan: NEXT PREVENTATIVE PHYSICAL DUE IN 1 YEAR. Return in about 6 months (around 09/11/2024) for HTN, HLD, DM2 FU.     [1]  Allergies Allergen Reactions   Dupixent [Dupilumab] Rash   Fasenra [Benralizumab] Hives   Glipizide Palpitations and Other (See Comments)    Shaky, feel bad Other reaction(s): Dizziness   Ciprofloxacin  Itching    Caused itching and burning    Cephalexin  Rash    Received IV rocephin , zosyn  w/o issues   Duloxetine Anxiety and Nausea Only  [2]  Social History Tobacco Use  Smoking Status Former   Current packs/day: 0.50   Average packs/day: 0.5 packs/day for 34.0 years (17.0 ttl pk-yrs)   Types: Cigarettes   Passive exposure: Past  Smokeless Tobacco Current   "

## 2024-03-13 NOTE — Assessment & Plan Note (Signed)
 Chronic.  Controlled.  Continue with current medication regimen of Gabapentin  600mg  QID.  Labs ordered today.  Return to clinic in 6 months for reevaluation.  Call sooner if concerns arise.

## 2024-03-14 ENCOUNTER — Ambulatory Visit: Payer: Self-pay | Admitting: Nurse Practitioner

## 2024-03-14 LAB — CBC WITH DIFFERENTIAL/PLATELET
Basophils Absolute: 0.1 x10E3/uL (ref 0.0–0.2)
Basos: 1 %
EOS (ABSOLUTE): 0 x10E3/uL (ref 0.0–0.4)
Eos: 0 %
Hematocrit: 39.8 % (ref 37.5–51.0)
Hemoglobin: 11.8 g/dL — ABNORMAL LOW (ref 13.0–17.7)
Immature Grans (Abs): 0 x10E3/uL (ref 0.0–0.1)
Immature Granulocytes: 0 %
Lymphocytes Absolute: 1.1 x10E3/uL (ref 0.7–3.1)
Lymphs: 17 %
MCH: 21.6 pg — ABNORMAL LOW (ref 26.6–33.0)
MCHC: 29.6 g/dL — ABNORMAL LOW (ref 31.5–35.7)
MCV: 73 fL — ABNORMAL LOW (ref 79–97)
Monocytes Absolute: 0.7 x10E3/uL (ref 0.1–0.9)
Monocytes: 12 %
Neutrophils Absolute: 4.4 x10E3/uL (ref 1.4–7.0)
Neutrophils: 70 %
Platelets: 265 x10E3/uL (ref 150–450)
RBC: 5.47 x10E6/uL (ref 4.14–5.80)
RDW: 17.7 % — ABNORMAL HIGH (ref 11.6–15.4)
WBC: 6.3 x10E3/uL (ref 3.4–10.8)

## 2024-03-14 LAB — COMPREHENSIVE METABOLIC PANEL WITH GFR
ALT: 39 IU/L (ref 0–44)
AST: 26 IU/L (ref 0–40)
Albumin: 4.5 g/dL (ref 3.8–4.9)
Alkaline Phosphatase: 68 IU/L (ref 47–123)
BUN/Creatinine Ratio: 13 (ref 10–24)
BUN: 13 mg/dL (ref 8–27)
Bilirubin Total: 0.6 mg/dL (ref 0.0–1.2)
CO2: 23 mmol/L (ref 20–29)
Calcium: 9.2 mg/dL (ref 8.6–10.2)
Chloride: 100 mmol/L (ref 96–106)
Creatinine, Ser: 1.04 mg/dL (ref 0.76–1.27)
Globulin, Total: 2.5 g/dL (ref 1.5–4.5)
Glucose: 135 mg/dL — ABNORMAL HIGH (ref 70–99)
Potassium: 3.7 mmol/L (ref 3.5–5.2)
Sodium: 139 mmol/L (ref 134–144)
Total Protein: 7 g/dL (ref 6.0–8.5)
eGFR: 82 mL/min/1.73

## 2024-03-14 LAB — HEMOGLOBIN A1C
Est. average glucose Bld gHb Est-mCnc: 171 mg/dL
Hgb A1c MFr Bld: 7.6 % — ABNORMAL HIGH (ref 4.8–5.6)

## 2024-03-14 LAB — LIPID PANEL
Chol/HDL Ratio: 4.6 ratio (ref 0.0–5.0)
Cholesterol, Total: 129 mg/dL (ref 100–199)
HDL: 28 mg/dL — ABNORMAL LOW
LDL Chol Calc (NIH): 51 mg/dL (ref 0–99)
Triglycerides: 323 mg/dL — ABNORMAL HIGH (ref 0–149)
VLDL Cholesterol Cal: 50 mg/dL — ABNORMAL HIGH (ref 5–40)

## 2024-03-14 LAB — TSH: TSH: 0.955 u[IU]/mL (ref 0.450–4.500)

## 2024-03-14 LAB — PSA: Prostate Specific Ag, Serum: 1.9 ng/mL (ref 0.0–4.0)

## 2024-03-20 ENCOUNTER — Ambulatory Visit: Admitting: Dermatology

## 2024-03-20 DIAGNOSIS — E291 Testicular hypofunction: Secondary | ICD-10-CM

## 2024-03-20 DIAGNOSIS — N200 Calculus of kidney: Secondary | ICD-10-CM

## 2024-03-20 NOTE — Progress Notes (Signed)
 psa

## 2024-03-21 ENCOUNTER — Other Ambulatory Visit

## 2024-03-22 ENCOUNTER — Other Ambulatory Visit

## 2024-03-28 ENCOUNTER — Ambulatory Visit: Admitting: Urology

## 2024-03-28 ENCOUNTER — Ambulatory Visit: Admitting: Dermatology

## 2024-03-31 ENCOUNTER — Other Ambulatory Visit

## 2024-03-31 ENCOUNTER — Other Ambulatory Visit: Payer: Self-pay | Admitting: Nurse Practitioner

## 2024-03-31 DIAGNOSIS — E291 Testicular hypofunction: Secondary | ICD-10-CM

## 2024-03-31 DIAGNOSIS — N200 Calculus of kidney: Secondary | ICD-10-CM

## 2024-03-31 NOTE — Telephone Encounter (Signed)
 Requested Prescriptions  Pending Prescriptions Disp Refills   FARXIGA  10 MG TABS tablet [Pharmacy Med Name: FARXIGA  10 MG TABLET] 90 tablet 0    Sig: Take 1 tablet (10 mg total) by mouth daily before breakfast.     Endocrinology:  Diabetes - SGLT2 Inhibitors Passed - 03/31/2024  1:37 PM      Passed - Cr in normal range and within 360 days    Creatinine  Date Value Ref Range Status  03/13/2014 1.08 0.60 - 1.30 mg/dL Final   Creatinine, Ser  Date Value Ref Range Status  03/13/2024 1.04 0.76 - 1.27 mg/dL Final   Creatinine, Urine  Date Value Ref Range Status  10/18/2023 46 mg/dL Final         Passed - HBA1C is between 0 and 7.9 and within 180 days    Hgb A1c MFr Bld  Date Value Ref Range Status  03/13/2024 7.6 (H) 4.8 - 5.6 % Final    Comment:             Prediabetes: 5.7 - 6.4          Diabetes: >6.4          Glycemic control for adults with diabetes: <7.0          Passed - eGFR in normal range and within 360 days    EGFR (African American)  Date Value Ref Range Status  03/13/2014 >60 >66mL/min Final  03/15/2012 >60  Final   GFR calc Af Amer  Date Value Ref Range Status  12/07/2019 59 (L) >60 mL/min Final   EGFR (Non-African Amer.)  Date Value Ref Range Status  03/13/2014 >60 >8mL/min Final    Comment:    eGFR values <1mL/min/1.73 m2 may be an indication of chronic kidney disease (CKD). Calculated eGFR, using the MRDR Study equation, is useful in  patients with stable renal function. The eGFR calculation will not be reliable in acutely ill patients when serum creatinine is changing rapidly. It is not useful in patients on dialysis. The eGFR calculation may not be applicable to patients at the low and high extremes of body sizes, pregnant women, and vegetarians.   03/15/2012 >60  Final    Comment:    eGFR values <52mL/min/1.73 m2 may be an indication of chronic kidney disease (CKD). Calculated eGFR is useful in patients with stable renal function. The eGFR  calculation will not be reliable in acutely ill patients when serum creatinine is changing rapidly. It is not useful in  patients on dialysis. The eGFR calculation may not be applicable to patients at the low and high extremes of body sizes, pregnant women, and vegetarians.    GFR, Estimated  Date Value Ref Range Status  10/22/2023 33 (L) >60 mL/min Final    Comment:    (NOTE) Calculated using the CKD-EPI Creatinine Equation (2021)    eGFR  Date Value Ref Range Status  03/13/2024 82 >59 mL/min/1.73 Final         Passed - Valid encounter within last 6 months    Recent Outpatient Visits           2 weeks ago Annual physical exam   [place] [facility] Melvin Pao, NP   5 months ago Chronic pain of both shoulders   [place] [facility] Melvin Pao, NP   6 months ago Type 2 diabetes mellitus with diabetic neuropathy, with long-term current use of insulin  [facility])   [place] [facility] Melvin Pao, NP  10 months ago Chronic obstructive pulmonary disease, unspecified COPD type [facility])   [place] [facility] Melvin Pao, NP   11 months ago Recurrent boils   [facility] Melvin Pao, NP       Future Appointments             In 3 days [place], Clotilda DELENA RIGGERS [facility] [place]   In 2 weeks Claudene Lehmann, MD [facility]

## 2024-04-01 LAB — PSA: Prostate Specific Ag, Serum: 1.5 ng/mL (ref 0.0–4.0)

## 2024-04-01 LAB — TESTOSTERONE: Testosterone: 997 ng/dL — ABNORMAL HIGH (ref 264–916)

## 2024-04-01 LAB — HEMOGLOBIN AND HEMATOCRIT, BLOOD
Hematocrit: 40.8 % (ref 37.5–51.0)
Hemoglobin: 11.6 g/dL — ABNORMAL LOW (ref 13.0–17.7)

## 2024-04-02 NOTE — Progress Notes (Unsigned)
 "   04/03/2024 1:56 PM   Darren Allen Newer [birth year] [id number]  Referring provider: Melvin Pao, NP [address] [place],  [place] [address]  Urological history: 1.  Testosterone  deficiency -Contributing factors of age, diabetes, former alcohol abuse and chronic pain medications -testosterone  level (03/2024) 997 -Hemoglobin/hematocrit (03/2024) 11.6/40.8 -testosterone  cypionate 200 mg/mL, 1 cc every 10 days   2. BPH with LU TS -PSA (03/2024) 1.5  3. ED -Contributing factors of age, BPH, diabetes, testosterone  deficiency, chronic pain medications, blood pressure medications, COPD, hyperlipidemia, obesity, sleep apnea, former alcohol abuse and smoking  4.  Nephrolithiasis -Spontaneous passage of left ureteral stone in 2012 - contrast CT (10/2023) 4 mm left UVJ stone, been seen on CT's since 07/2023  Chief Complaint  Patient presents with   Nephrolithiasis   Follow-up    HPI: Darren Allen is a 59 y.o. male who presents today for follow up on hypogonadism.   Previous records reviewed.  He reports good adherence to testosterone  cypionate 200 mg/mL, 1 cc every 10 days.  Denies new complaints of low libido, erectile dysfunction, fatigue, or mood changes.  No complaints of gynecomastia, visual changes, or thromboembolic symptoms.  Energy level, libido and overall sense of wellbeing being reported as stable/ improved compared to prior visit.  His recent labs were taken 2 days after he injected.  Testosterone  level (03/2024) 997  Hemoglobin/hematocrit (03/2024) 11.6/40.8  I PSS 8/3  He reports urinary frequency, urinary intermittency, a weak urinary stream, nocturia x 3, and post void dribbling.   Patient denies any modifying or aggravating factors.  Patient denies any recent UTI's, gross hematuria, dysuria or suprapubic/flank pain.  Patient denies any fevers, chills, nausea or vomiting.    He has been pressing the perineal after voiding and it helps.    UA yellow  clear, specific gravity 1.010, pH 7.0, 3+ glucose, 0-5 WBCs, and no RBCs.  PVR 1 mL   PSA (03/2024) 1.5  Serum creatinine (03/2024) 1.2  Hemoglobin A1c (03/2024) 8.1  SHIM 11  He is having satisfactory erections now that he is having therapeutic levels on testosterone .  Patient still having spontaneous erections.  He denies any pain or curvature with erections.    Cholesterol (02/2024) elevated triglycerides, low HDL, and elevated VLDL    TSH (02/2024) 0.955  He has had a stone present on CT imaging since June of 2025 in the left UVJ.  KUB no stone seen.  UA no micro heme.   PMH: Past Medical History:  Diagnosis Date   Acute nontraumatic kidney injury 01/21/2022   Allergy    Anxiety    Asthma    Chronic kidney disease    Chronic lower back pain    a.) followed by pain management; on COT   COPD (chronic obstructive pulmonary disease) (HCC)    Coronary artery disease    a.) cCTA 06/30/2021: Ca2+ = 216 (84th %ile; 25049% pRCA and pLAD))   DDD (degenerative disc disease), lumbosacral    Depression    Diabetic peripheral neuropathy (HCC)    Diastolic dysfunction 12/27/2020   a.) TTE 12/27/2020: EF >55%, no RWMAs, G1DD, norm RVSF, triv MR/TR?PR   Diverticulosis    Emphysema of lung (HCC)    Erectile dysfunction    a.) on PDE5i (tadalafil )   GERD (gastroesophageal reflux disease)    Hepatic steatosis    History of kidney stones    Hyperlipidemia    Hypertension    Hypogonadism male    a.) on exogenous TRT (  depotestosterone cypionate)   IDA (iron deficiency anemia)    Long term current use of opiate analgesic    a.) followed by pain management; naloxone  Rx available   OSA on CPAP    Osteoporosis    Paraesophageal hernia    a.) s/p robotic assisted repair 01/2022   PVD (peripheral vascular disease)    Sleep apnea    T2DM (type 2 diabetes mellitus) (HCC)    Umbilical hernia    a.) s/p repair 01/20/2022   Ventral hernia     Surgical History: Past Surgical History:   Procedure Laterality Date   ABDOMINAL HYSTERECTOMY     APPENDECTOMY     BRONCHOSCOPY  2016   CIRCUMCISION     COLONOSCOPY WITH PROPOFOL  N/A 12/31/2021   Procedure: COLONOSCOPY WITH PROPOFOL ;  Surgeon: Therisa Bi, MD;  Location[facility] ENDOSCOPY;  Service: Gastroenterology;  Laterality: N/A;   ESOPHAGOGASTRODUODENOSCOPY N/A 12/31/2021   Procedure: ESOPHAGOGASTRODUODENOSCOPY (EGD);  Surgeon: Therisa Bi, MD;  Location: [facility] ENDOSCOPY;  Service: Gastroenterology;  Laterality: N/A;   EXCISION OF MESH N/A 10/19/2023   Procedure: REMOVAL, MESH, ABDOMEN OR PELVIS;  Surgeon: Jordis Laneta FALCON, MD;  Location: [facility] ORS;  Service: General;  Laterality: N/A;   HERNIA REPAIR  07/2023   INCISION AND DRAINAGE PERIRECTAL ABSCESS N/A 02/04/2015   Procedure: IRRIGATION AND DEBRIDEMENT PERIRECTAL ABSCESS;  Surgeon: Lonni Brands, MD;  Location: [facility] ORS;  Service: General;  Laterality: N/A;   INSERTION OF MESH  01/20/2022   Procedure: INSERTION OF MESH;  Surgeon: Jordis Laneta FALCON, MD;  Location: [facility] ORS;  Service: General;;   KIDNEY STONE SURGERY Right    LEFT HEART CATH AND CORONARY ANGIOGRAPHY Left 08/16/2023   Procedure: LEFT HEART CATH AND CORONARY ANGIOGRAPHY;  Surgeon: Florencio Cara BIRCH, MD;  Location: [facility] INVASIVE CV LAB;  Service: Cardiovascular;  Laterality: Left;   lung mass removal N/A    MUSCLE BIOPSY     ORIF ANKLE FRACTURE Left 03/23/2017   Procedure: OPEN REDUCTION INTERNAL FIXATION (ORIF) ANKLE FRACTURE;  Surgeon: Tobie Priest, MD;  Location: [facility] ORS;  Service: Orthopedics;  Laterality: Left;   RECTAL EXAM UNDER ANESTHESIA  02/04/2015   Procedure: RECTAL EXAM UNDER ANESTHESIA;  Surgeon: Lonni Brands, MD;  Location: [facility] ORS;  Service: General;;   SYNDESMOSIS REPAIR Left 03/23/2017   Procedure: SYNDESMOSIS REPAIR;  Surgeon: Tobie Priest, MD;  Location: [facility] ORS;  Service: Orthopedics;  Laterality: Left;   UMBILICAL HERNIA REPAIR  01/20/2022   Procedure: HERNIA REPAIR UMBILICAL  ADULT;  Surgeon: Jordis Laneta FALCON, MD;  Location: [facility] ORS;  Service: General;;   VASECTOMY     VENTRAL HERNIA REPAIR N/A 10/13/2023   Procedure: EXCISIONAL DEBRIDEMENT OF SKIN AND FASCIA;  Surgeon: Jordis Laneta FALCON, MD;  Location: [facility] ORS;  Service: General;  Laterality: N/A;  open procedure w/mesh   VENTRAL HERNIA REPAIR N/A 10/19/2023   Procedure: REPAIR, HERNIA, VENTRAL;  Surgeon: Jordis Laneta FALCON, MD;  Location: [facility] ORS;  Service: General;  Laterality: N/A;   VENTRAL HERNIA REPAIR N/A 04/08/2023   Procedure: HERNIA REPAIR VENTRAL ADULT, open;  Surgeon: Jordis Laneta FALCON, MD;  Location: [facility] ORS;  Service: General;  Laterality: N/A;   XI ROBOTIC ASSISTED PARAESOPHAGEAL HERNIA REPAIR N/A 01/20/2022   Procedure: XI ROBOTIC ASSISTED PARAESOPHAGEAL HERNIA REPAIR, CONVERTED TO OPEN, RNFA to assist;  Surgeon: Jordis Laneta FALCON, MD;  Location: [facility] ORS;  Service: General;  Laterality: N/A;    Home Medications:  Allergies as of 04/03/2024       Reactions  Dupixent [dupilumab] Rash   Fasenra [benralizumab] Hives   Glipizide Palpitations, Other (See Comments)   Shaky, feel bad Other reaction(s): Dizziness   Ciprofloxacin  Itching   Caused itching and burning    Cephalexin  Rash   Received IV rocephin , zosyn  w/o issues   Duloxetine Anxiety, Nausea Only        Medication List        Accurate as of April 03, 2024  1:56 PM. If you have any questions, ask your nurse or doctor.          amitriptyline  10 MG tablet Commonly known as: ELAVIL  Take 30 mg by mouth at bedtime.   ascorbic acid 1000 MG tablet Commonly known as: VITAMIN C Take 1,000 mg by mouth daily.   aspirin  EC 81 MG tablet Take 81 mg by mouth daily. Swallow whole.   BD SafetyGlide Needle 18G X 1-1/2 Misc Generic drug: NEEDLE (DISP) 18 G 1 mg by Does not apply route every 7 (seven) days.   BD SafetyGlide Needle 21G X 1 Misc Generic drug: NEEDLE (DISP) 21 G 1 mg by Does not apply route every 7 (seven) days.   Breztri   Aerosphere 160-9-4.8 MCG/ACT Aero inhaler Generic drug: budesonide -glycopyrrolate -formoterol  Inhale 2 puffs into the lungs in the morning and at bedtime.   Clindamycin-Benzoyl Per (Refr) gel Apply 1 Application topically every morning.   cyanocobalamin  1000 MCG tablet Commonly known as: VITAMIN B12 Take 1,000 mcg by mouth daily.   EPINEPHrine  0.3 mg/0.3 mL Soaj injection Commonly known as: EPI-PEN 0.3 mg as needed for anaphylaxis.   Farxiga  10 MG Tabs tablet Generic drug: dapagliflozin  propanediol Take 1 tablet (10 mg total) by mouth daily before breakfast.   ferrous sulfate  325 (65 FE) MG tablet Take 325 mg by mouth daily with breakfast.   fluticasone  50 MCG/ACT nasal spray Commonly known as: FLONASE  Place 1 spray into both nostrils daily as needed for allergies.   gabapentin  600 MG tablet Commonly known as: NEURONTIN  Take 1 tablet (600 mg total) by mouth 4 (four) times daily.   HumuLIN 70/30 KwikPen (70-30) 100 UNIT/ML KwikPen Generic drug: insulin  isophane & regular human KwikPen Inject 42-82 Units into the skin 2 (two) times daily with a meal. 82 in the a.m. and 42u qhs   hydrochlorothiazide  25 MG tablet Commonly known as: HYDRODIURIL  Take 1 tablet (25 mg total) by mouth daily.   HYDROcodone -acetaminophen  5-325 MG tablet Commonly known as: NORCO/VICODIN Take 1 tablet by mouth 2 (two) times daily as needed. Must last 30 days.   HYDROcodone -acetaminophen  5-325 MG tablet Commonly known as: NORCO/VICODIN Take 1 tablet by mouth 2 (two) times daily as needed. Must last 30 days.   HYDROcodone -acetaminophen  5-325 MG tablet Commonly known as: NORCO/VICODIN Take 1 tablet by mouth 2 (two) times daily as needed. Must last 30 days. Start taking on: April 24, 2024   ipratropium 0.06 % nasal spray Commonly known as: ATROVENT  Place 2 sprays into both nostrils 4 (four) times daily.   ipratropium-albuterol  0.5-2.5 (3) MG/3ML Soln Commonly known as: DUONEB Take 3 mLs by  nebulization every 4 (four) hours as needed (SOB/Wheezing/Asthma).   lisinopril  10 MG tablet Commonly known as: ZESTRIL  Take 1 tablet (10 mg total) by mouth daily.   lubiprostone  8 MCG capsule Commonly known as: AMITIZA  Take 1 capsule (8 mcg total) by mouth 2 (two) times daily with a meal.   Magnesium  Oxide -Mg Supplement 500 MG Tabs Take 500 mg by mouth daily.   metFORMIN  500 MG 24 hr tablet Commonly  known as: GLUCOPHAGE -XR Take 2 tablets (1,000 mg total) by mouth 2 (two) times daily with a meal.   montelukast  10 MG tablet Commonly known as: SINGULAIR  Take 10 mg by mouth daily as needed (Allergies).   Mounjaro 12.5 MG/0.5ML Pen Generic drug: tirzepatide SMARTSIG:0.5 Milliliter(s) SUB-Q Once a Week   multivitamin tablet Take 1 tablet by mouth daily. Mega Men   naloxone  4 MG/0.1ML Liqd nasal spray kit Commonly known as: NARCAN  Place 1 spray into the nose as needed for up to 365 doses (for opioid-induced respiratory depresssion). In case of emergency (overdose), spray once into each nostril. If no response within 3 minutes, repeat application and call 911.   pantoprazole  40 MG tablet Commonly known as: PROTONIX  Take 1 tablet (40 mg total) by mouth 2 (two) times daily.   PRESCRIPTION MEDICATION Pt has CPAP Machine   ProAir  HFA 108 (90 Base) MCG/ACT inhaler Generic drug: albuterol  Inhale 2 puffs into the lungs every 6 (six) hours as needed for wheezing or shortness of breath.   rosuvastatin  40 MG tablet Commonly known as: CRESTOR  TAKE 1 TABLET(40 MG) BY MOUTH DAILY What changed: See the new instructions.   tazarotene  0.1 % cream Commonly known as: AVAGE  Apply topically at bedtime. qhs to face and chest for acne   testosterone  cypionate 200 MG/ML injection Commonly known as: DEPOTESTOSTERONE CYPIONATE Inject 1 cc every 10 days.   triamcinolone  cream 0.1 % Commonly known as: KENALOG  Apply topically 2 (two) times daily.   Vascepa 1 g capsule Generic drug:  icosapent Ethyl Take 2 g by mouth 2 (two) times daily.        Allergies:  Allergies  Allergen Reactions   Dupixent [Dupilumab] Rash   Fasenra [Benralizumab] Hives   Glipizide Palpitations and Other (See Comments)    Shaky, feel bad Other reaction(s): Dizziness   Ciprofloxacin  Itching    Caused itching and burning    Cephalexin  Rash    Received IV rocephin , zosyn  w/o issues   Duloxetine Anxiety and Nausea Only    Family History: Family History  Problem Relation Age of Onset   Lung cancer Mother 23   Cancer Mother    Colon cancer Father 103   Heart disease Father    Alcohol abuse Father    Cancer Father    Esophageal cancer Brother 9   Diabetes Brother    Cancer Brother    Kidney disease Brother    Heart disease Brother    Lung cancer Maternal Aunt    Cancer Paternal Aunt        unk type   Cancer Maternal Grandfather        unk type   Drug abuse Daughter     Social History:  reports that he has quit smoking. His smoking use included cigarettes. He has a 17 pack-year smoking history. He has been exposed to tobacco smoke. He uses smokeless tobacco. He reports that he does not currently use alcohol after a past usage of about 12.0 standard drinks of alcohol per week. He reports current drug use. Drug: Hydrocodone .  ROS: Pertinent ROS in HPI  Physical Exam: Blood pressure (!) 154/75, pulse 99, weight 238 lb (108 kg), SpO2 96%.  Constitutional:  Well nourished. Alert and oriented, No acute distress. HEENT: [place] AT, moist mucus membranes.  Trachea midline Cardiovascular: No clubbing, cyanosis, or edema. Respiratory: Normal respiratory effort, no increased work of breathing. Neurologic: Grossly intact, no focal deficits, moving all 4 extremities. Psychiatric: Normal mood and affect.   Laboratory  Data: See Epic and HPI   I have reviewed the labs.   Pertinent Imaging:  04/03/24 13:43  Scan Result 1mL    Assessment & Plan:    1.  Hypogonadism -testosterone   levels super therapeutic -hemoglobin/hematocrit low, secondary to anemia of chronic disease -continue testosterone  cypionate 200 mg/mL injections, 1.0 cc every 10 days; refills given   2. BPH with LU TS - moderate symptoms and he is mixed about his urinary condition - PSA up to date  - PVR < 300 cc  - most bothersome symptoms are post void dribbling  - encouraged avoiding bladder irritants, fluid restriction before bedtime and timed voiding's - patient will continue to press the perineal area after voiding and he will start Kegel exercises   3. Erectile dysfunction:    - he has not needed PDE5i's since his testosterone  levels have been therapeutic  4.  Nephrolithiasis - Serial CT's starting in 08/2023 and ending in 10/2023 with a left 4 mm UVJ stone - KUB the left UVJ stone is not seen  - UA no micro heme - Likely passed spontaneously  Return in about 6 months (around 10/01/2024) for PSA, testosterone  (5 days after an injection) H & H only .  These notes generated with voice recognition software. I apologize for typographical errors.  CLOTILDA HELON RIGGERS  [facility] [address]  [address] [place], [place] [address] [phone]   "

## 2024-04-03 ENCOUNTER — Other Ambulatory Visit: Payer: Self-pay | Admitting: Nurse Practitioner

## 2024-04-03 ENCOUNTER — Other Ambulatory Visit: Payer: Self-pay

## 2024-04-03 ENCOUNTER — Ambulatory Visit
Admission: RE | Admit: 2024-04-03 | Discharge: 2024-04-03 | Disposition: A | Source: Ambulatory Visit | Attending: Urology | Admitting: Urology

## 2024-04-03 ENCOUNTER — Ambulatory Visit: Admitting: Urology

## 2024-04-03 ENCOUNTER — Ambulatory Visit
Admission: RE | Admit: 2024-04-03 | Discharge: 2024-04-03 | Disposition: A | Discharge status: Home / Self Care | Source: Ambulatory Visit | Attending: Urology | Admitting: Urology

## 2024-04-03 ENCOUNTER — Encounter: Payer: Self-pay | Admitting: Urology

## 2024-04-03 VITALS — BP 143/81 | HR 99 | Wt 238.0 lb

## 2024-04-03 DIAGNOSIS — E291 Testicular hypofunction: Secondary | ICD-10-CM | POA: Diagnosis not present

## 2024-04-03 DIAGNOSIS — N200 Calculus of kidney: Secondary | ICD-10-CM

## 2024-04-03 DIAGNOSIS — N401 Enlarged prostate with lower urinary tract symptoms: Secondary | ICD-10-CM | POA: Diagnosis not present

## 2024-04-03 DIAGNOSIS — N529 Male erectile dysfunction, unspecified: Secondary | ICD-10-CM

## 2024-04-03 DIAGNOSIS — N138 Other obstructive and reflux uropathy: Secondary | ICD-10-CM

## 2024-04-03 LAB — URINALYSIS, COMPLETE
Bilirubin, UA: NEGATIVE
Ketones, UA: NEGATIVE
Leukocytes,UA: NEGATIVE
Nitrite, UA: NEGATIVE
Protein,UA: NEGATIVE
RBC, UA: NEGATIVE
Specific Gravity, UA: 1.01 (ref 1.005–1.030)
Urobilinogen, Ur: 0.2 mg/dL (ref 0.2–1.0)
pH, UA: 7 (ref 5.0–7.5)

## 2024-04-03 LAB — MICROSCOPIC EXAMINATION
Bacteria, UA: NONE SEEN
Epithelial Cells (non renal): NONE SEEN /HPF (ref 0–10)
RBC, Urine: NONE SEEN /HPF (ref 0–2)

## 2024-04-03 LAB — BLADDER SCAN AMB NON-IMAGING

## 2024-04-03 MED ORDER — "BD SAFETYGLIDE NEEDLE 18G X 1-1/2"" MISC": 1.0000 mg | 0 refills | Status: AC

## 2024-04-03 MED ORDER — TESTOSTERONE CYPIONATE 200 MG/ML IM SOLN: INTRAMUSCULAR | 0 refills | Status: AC

## 2024-04-03 MED ORDER — "BD SAFETYGLIDE NEEDLE 21G X 1"" MISC": 1.0000 mg | 0 refills | Status: AC

## 2024-04-03 NOTE — Progress Notes (Signed)
 kub

## 2024-04-04 NOTE — Telephone Encounter (Signed)
 Requested Prescriptions  Pending Prescriptions Disp Refills   metFORMIN  (GLUCOPHAGE -XR) 500 MG 24 hr tablet [Pharmacy Med Name: METFORMIN  HCL ER 500 MG TABLET] 360 tablet 1    Sig: TAKE 4 TABLETS BY MOUTH DAILY     Endocrinology:  Diabetes - Biguanides Passed - 04/04/2024 11:30 AM      Passed - Cr in normal range and within 360 days    Creatinine  Date Value Ref Range Status  03/13/2014 1.08 0.60 - 1.30 mg/dL Final   Creatinine, Ser  Date Value Ref Range Status  03/13/2024 1.04 0.76 - 1.27 mg/dL Final   Creatinine, Urine  Date Value Ref Range Status  10/18/2023 46 mg/dL Final         Passed - HBA1C is between 0 and 7.9 and within 180 days    Hgb A1c MFr Bld  Date Value Ref Range Status  03/13/2024 7.6 (H) 4.8 - 5.6 % Final    Comment:             Prediabetes: 5.7 - 6.4          Diabetes: >6.4          Glycemic control for adults with diabetes: <7.0          Passed - eGFR in normal range and within 360 days    EGFR (African American)  Date Value Ref Range Status  03/13/2014 >60 >98mL/min Final  03/15/2012 >60  Final   GFR calc Af Amer  Date Value Ref Range Status  12/07/2019 59 (L) >60 mL/min Final   EGFR (Non-African Amer.)  Date Value Ref Range Status  03/13/2014 >60 >12mL/min Final    Comment:    eGFR values <42mL/min/1.73 m2 may be an indication of chronic kidney disease (CKD). Calculated eGFR, using the MRDR Study equation, is useful in  patients with stable renal function. The eGFR calculation will not be reliable in acutely ill patients when serum creatinine is changing rapidly. It is not useful in patients on dialysis. The eGFR calculation may not be applicable to patients at the low and high extremes of body sizes, pregnant women, and vegetarians.   03/15/2012 >60  Final    Comment:    eGFR values <22mL/min/1.73 m2 may be an indication of chronic kidney disease (CKD). Calculated eGFR is useful in patients with stable renal function. The eGFR  calculation will not be reliable in acutely ill patients when serum creatinine is changing rapidly. It is not useful in  patients on dialysis. The eGFR calculation may not be applicable to patients at the low and high extremes of body sizes, pregnant women, and vegetarians.    GFR, Estimated  Date Value Ref Range Status  10/22/2023 33 (L) >60 mL/min Final    Comment:    (NOTE) Calculated using the CKD-EPI Creatinine Equation (2021)    eGFR  Date Value Ref Range Status  03/13/2024 82 >59 mL/min/1.73 Final         Passed - B12 Level in normal range and within 720 days    Vitamin B-12  Date Value Ref Range Status  12/08/2023 867 232 - 1,245 pg/mL Final         Passed - Valid encounter within last 6 months    Recent Outpatient Visits           3 weeks ago Annual physical exam   [place] Melvin Pao, NP   5 months ago Chronic pain of both shoulders   [place]  [facility] Melvin Pao, NP   6 months ago Type 2 diabetes mellitus with diabetic neuropathy, with long-term current use of insulin  [facility])   [place] [facility] Melvin Pao, NP   10 months ago Chronic obstructive pulmonary disease, unspecified COPD type [facility]   [place] [facility] Melvin Pao, NP   11 months ago Recurrent boils   [place] [facility] Melvin Pao, NP       Future Appointments             In 2 weeks Claudene Lehmann, MD  [facility]   In 6 months  [facility] [place]            Passed - CBC within normal limits and completed in the last 12 months    WBC  Date Value Ref Range Status  03/13/2024 6.3 3.4 - 10.8 x10E3/uL Final  10/22/2023 7.3 4.0 - 10.5 K/uL Final   RBC  Date Value Ref Range Status  03/13/2024 5.47 4.14 - 5.80 x10E6/uL Final  10/22/2023 4.69 4.22 - 5.81 MIL/uL Final   Hemoglobin  Date Value Ref Range Status  03/31/2024  11.6 (L) 13.0 - 17.7 g/dL Final   Hematocrit  Date Value Ref Range Status  03/31/2024 40.8 37.5 - 51.0 % Final   MCHC  Date Value Ref Range Status  03/13/2024 29.6 (L) 31.5 - 35.7 g/dL Final  91/91/7974 69.2 30.0 - 36.0 g/dL Final   [facility]  Date Value Ref Range Status  03/13/2024 21.6 (L) 26.6 - 33.0 pg Final  10/22/2023 23.7 (L) 26.0 - 34.0 pg Final   MCV  Date Value Ref Range Status  03/13/2024 73 (L) 79 - 97 fL Final  03/13/2014 83 80 - 100 fL Final   No results found for: PLTCOUNTKUC, LABPLAT, POCPLA RDW  Date Value Ref Range Status  03/13/2024 17.7 (H) 11.6 - 15.4 % Final  03/13/2014 15.2 (H) 11.5 - 14.5 % Final

## 2024-04-20 ENCOUNTER — Encounter: Payer: Self-pay | Admitting: Dermatology

## 2024-04-20 ENCOUNTER — Ambulatory Visit: Admitting: Dermatology

## 2024-04-20 DIAGNOSIS — L720 Epidermal cyst: Secondary | ICD-10-CM

## 2024-04-20 DIAGNOSIS — L821 Other seborrheic keratosis: Secondary | ICD-10-CM

## 2024-04-20 DIAGNOSIS — B353 Tinea pedis: Secondary | ICD-10-CM

## 2024-04-20 DIAGNOSIS — Z872 Personal history of diseases of the skin and subcutaneous tissue: Secondary | ICD-10-CM

## 2024-04-20 DIAGNOSIS — L700 Acne vulgaris: Secondary | ICD-10-CM

## 2024-04-20 DIAGNOSIS — Z7189 Other specified counseling: Secondary | ICD-10-CM

## 2024-04-20 DIAGNOSIS — B351 Tinea unguium: Secondary | ICD-10-CM

## 2024-04-20 MED ORDER — TERBINAFINE HCL 250 MG PO TABS: 250.0000 mg | ORAL_TABLET | Freq: Every day | ORAL | 0 refills | Status: AC

## 2024-04-20 NOTE — Patient Instructions (Signed)

## 2024-04-20 NOTE — Progress Notes (Signed)
 "  Follow-Up Visit   Subjective  Darren Allen is a 59 y.o. male who presents for the following: Acne face, chest d/c Tazarotene  0.1% cr did not think helped, patient noticed face will clear and flares when he eats tomatoes, Tinea Pedis/Corporis with Tinea Unguium, leg, feet, toenails, Terbinafine  x 3 months finishing ~09/2023, check spot abdomen    The following portions of the chart were reviewed this encounter and updated as appropriate: medications, allergies, medical history  Review of Systems:  No other skin or systemic complaints except as noted in HPI or Assessment and Plan.  Objective  Well appearing patient in no apparent distress; mood and affect are within normal limits.   A focused examination was performed of the following areas: Face, chest, feet, abdomen, back  Relevant exam findings are noted in the Assessment and Plan.    Assessment & Plan   ACNE VULGARIS, MILIA, EICs Face, chest Exam: small white firm papules and inflammatory papules scattered on cheeks and back.  Chronic and persistent condition with duration or expected duration over one year. Condition is symptomatic/ bothersome to patient. Not currently at goal.   Treatment Plan: Discussed Isotretinoin , may consider in future when finish Terbinafine  course   TINEA PEDIS with TINEA UNGUIUM with hx of TINEA CORPORIS  Exam: R med foot broad erythematous scaly plaque. Discoloration thickened subungual hyperkeratosis of toenails  Treatment Plan: Recommend another course of Terbinafine  Start Terbinafine  250mg  1 po qd x 69m   Terbinafine  Counseling  Terbinafine  is an anti-fungal medicine that can be applied to the skin (over the counter) or taken by mouth (prescription) to treat fungal infections. The pill version is often used to treat fungal infections of the nails or scalp. While most people do not have any side effects from taking terbinafine  pills, some possible side effects of the medicine can  include taste changes, headache, loss of smell, vision changes, nausea, vomiting, or diarrhea.   Rare side effects can include irritation of the liver, allergic reaction, or decrease in blood counts (which may show up as not feeling well or developing an infection). If you are concerned about any of these side effects, please stop the medicine and call your doctor, or in the case of an emergency such as feeling very unwell, seek immediate medical care.    EPIDERMAL INCLUSION CYST Overlying sternum Exam: Subcutaneous inflamed nodule overlying sternum  Benign-appearing. Exam most consistent with an epidermal inclusion cyst. Discussed that a cyst is a benign growth that can grow over time and sometimes get irritated or inflamed. Recommend observation if it is not bothersome. Discussed option of surgical excision to remove it if it is growing, symptomatic, or other changes noted. Please call for new or changing lesions so they can be evaluated. Discussed if area gets painful send mychart message and we can send in Doxycycline     SEBORRHEIC KERATOSIS L lower abdomen - Stuck-on, waxy, tan-brown papules and/or plaques  - Benign-appearing - Discussed benign etiology and prognosis. - Observe - Call for any changes  Cafe au Lait  R lower back - Tan patch - Genetic - Benign, observe - Call for any changes TINEA PEDIS OF BOTH FEET   ONYCHOMYCOSIS   ACNE VULGARIS   EIC (EPIDERMAL INCLUSION CYST)   SEBORRHEIC KERATOSES    Return in about 6 months (around 10/18/2024) for Tinea pedis/Unguium f/u.  I, Grayce Saunas, RMA, am acting as scribe for Boneta Sharps, MD .   Documentation: I have reviewed the above documentation for  accuracy and completeness, and I agree with the above.  Boneta Sharps, MD    "

## 2024-04-25 ENCOUNTER — Ambulatory Visit: Admitting: Nurse Practitioner

## 2024-05-18 ENCOUNTER — Encounter: Admitting: Nurse Practitioner

## 2024-06-13 ENCOUNTER — Ambulatory Visit

## 2024-09-12 ENCOUNTER — Ambulatory Visit: Admitting: Nurse Practitioner

## 2024-10-02 ENCOUNTER — Ambulatory Visit

## 2024-10-19 ENCOUNTER — Ambulatory Visit: Admitting: Dermatology
